# Patient Record
Sex: Male | Born: 1950 | Race: White | Hispanic: No | Marital: Single | Smoking: Never smoker
Health system: Southern US, Community
[De-identification: ages and names within clinical notes are randomized; demographics above are authoritative.]

## PROBLEM LIST (undated history)

## (undated) ENCOUNTER — Emergency Department (HOSPITAL_COMMUNITY): Admission: EM | Source: Home / Self Care

## (undated) ENCOUNTER — Ambulatory Visit: Admission: EM | Source: Home / Self Care

## (undated) DIAGNOSIS — R0602 Shortness of breath: Secondary | ICD-10-CM

## (undated) DIAGNOSIS — I499 Cardiac arrhythmia, unspecified: Secondary | ICD-10-CM

## (undated) DIAGNOSIS — K219 Gastro-esophageal reflux disease without esophagitis: Secondary | ICD-10-CM

## (undated) DIAGNOSIS — T8859XA Other complications of anesthesia, initial encounter: Secondary | ICD-10-CM

## (undated) DIAGNOSIS — E039 Hypothyroidism, unspecified: Secondary | ICD-10-CM

## (undated) DIAGNOSIS — M009 Pyogenic arthritis, unspecified: Secondary | ICD-10-CM

## (undated) DIAGNOSIS — J189 Pneumonia, unspecified organism: Secondary | ICD-10-CM

## (undated) DIAGNOSIS — Z86718 Personal history of other venous thrombosis and embolism: Secondary | ICD-10-CM

## (undated) DIAGNOSIS — Z9289 Personal history of other medical treatment: Secondary | ICD-10-CM

## (undated) DIAGNOSIS — G4730 Sleep apnea, unspecified: Secondary | ICD-10-CM

## (undated) DIAGNOSIS — I4819 Other persistent atrial fibrillation: Secondary | ICD-10-CM

## (undated) DIAGNOSIS — Z7901 Long term (current) use of anticoagulants: Secondary | ICD-10-CM

## (undated) DIAGNOSIS — R51 Headache: Secondary | ICD-10-CM

## (undated) DIAGNOSIS — T4145XA Adverse effect of unspecified anesthetic, initial encounter: Secondary | ICD-10-CM

## (undated) DIAGNOSIS — I509 Heart failure, unspecified: Secondary | ICD-10-CM

## (undated) DIAGNOSIS — R52 Pain, unspecified: Secondary | ICD-10-CM

## (undated) HISTORY — DX: Pyogenic arthritis, unspecified: M00.9

## (undated) HISTORY — DX: Long term (current) use of anticoagulants: Z79.01

## (undated) HISTORY — PX: CARDIAC CATHETERIZATION: SHX172

## (undated) HISTORY — PX: TONSILLECTOMY: SUR1361

## (undated) HISTORY — DX: Other persistent atrial fibrillation: I48.19

## (undated) HISTORY — PX: OTHER SURGICAL HISTORY: SHX169

---

## 1996-02-09 HISTORY — PX: OTHER SURGICAL HISTORY: SHX169

## 1997-02-08 HISTORY — PX: OTHER SURGICAL HISTORY: SHX169

## 1999-02-20 ENCOUNTER — Encounter: Payer: Self-pay | Admitting: Orthopedic Surgery

## 1999-02-24 ENCOUNTER — Inpatient Hospital Stay (HOSPITAL_COMMUNITY): Admission: RE | Admit: 1999-02-24 | Discharge: 1999-03-01 | Payer: Self-pay | Admitting: Orthopedic Surgery

## 1999-02-24 ENCOUNTER — Encounter: Payer: Self-pay | Admitting: Orthopedic Surgery

## 2000-02-09 HISTORY — PX: OTHER SURGICAL HISTORY: SHX169

## 2000-04-29 ENCOUNTER — Encounter: Payer: Self-pay | Admitting: Cardiovascular Disease

## 2000-04-29 ENCOUNTER — Ambulatory Visit (HOSPITAL_COMMUNITY): Admission: RE | Admit: 2000-04-29 | Discharge: 2000-04-29 | Payer: Self-pay | Admitting: Cardiovascular Disease

## 2000-08-09 ENCOUNTER — Inpatient Hospital Stay (HOSPITAL_COMMUNITY): Admission: RE | Admit: 2000-08-09 | Discharge: 2000-08-13 | Payer: Self-pay | Admitting: Orthopedic Surgery

## 2001-02-08 HISTORY — PX: OTHER SURGICAL HISTORY: SHX169

## 2002-02-08 HISTORY — PX: OTHER SURGICAL HISTORY: SHX169

## 2003-11-19 ENCOUNTER — Inpatient Hospital Stay (HOSPITAL_COMMUNITY): Admission: RE | Admit: 2003-11-19 | Discharge: 2003-11-22 | Payer: Self-pay | Admitting: Orthopedic Surgery

## 2004-02-09 HISTORY — PX: OTHER SURGICAL HISTORY: SHX169

## 2004-02-09 HISTORY — PX: CARDIAC CATHETERIZATION: SHX172

## 2004-04-07 ENCOUNTER — Inpatient Hospital Stay (HOSPITAL_COMMUNITY): Admission: RE | Admit: 2004-04-07 | Discharge: 2004-04-11 | Payer: Self-pay | Admitting: Orthopedic Surgery

## 2004-05-29 ENCOUNTER — Ambulatory Visit: Payer: Self-pay | Admitting: Internal Medicine

## 2004-05-29 ENCOUNTER — Inpatient Hospital Stay (HOSPITAL_COMMUNITY): Admission: AD | Admit: 2004-05-29 | Discharge: 2004-06-08 | Payer: Self-pay | Admitting: Orthopedic Surgery

## 2004-06-02 ENCOUNTER — Encounter (INDEPENDENT_AMBULATORY_CARE_PROVIDER_SITE_OTHER): Payer: Self-pay | Admitting: *Deleted

## 2004-08-17 ENCOUNTER — Ambulatory Visit: Payer: Self-pay | Admitting: Infectious Diseases

## 2005-09-27 ENCOUNTER — Emergency Department (HOSPITAL_COMMUNITY): Admission: EM | Admit: 2005-09-27 | Discharge: 2005-09-28 | Payer: Self-pay | Admitting: Emergency Medicine

## 2005-09-28 ENCOUNTER — Inpatient Hospital Stay (HOSPITAL_COMMUNITY): Admission: AD | Admit: 2005-09-28 | Discharge: 2005-10-06 | Payer: Self-pay | Admitting: Orthopedic Surgery

## 2005-09-28 ENCOUNTER — Ambulatory Visit: Payer: Self-pay | Admitting: Infectious Diseases

## 2005-09-30 ENCOUNTER — Encounter (INDEPENDENT_AMBULATORY_CARE_PROVIDER_SITE_OTHER): Payer: Self-pay | Admitting: *Deleted

## 2005-11-18 ENCOUNTER — Ambulatory Visit: Payer: Self-pay | Admitting: Internal Medicine

## 2005-11-18 ENCOUNTER — Inpatient Hospital Stay (HOSPITAL_COMMUNITY): Admission: RE | Admit: 2005-11-18 | Discharge: 2005-11-24 | Payer: Self-pay | Admitting: Orthopedic Surgery

## 2005-12-28 ENCOUNTER — Ambulatory Visit: Payer: Self-pay | Admitting: Internal Medicine

## 2006-01-20 ENCOUNTER — Ambulatory Visit (HOSPITAL_BASED_OUTPATIENT_CLINIC_OR_DEPARTMENT_OTHER): Admission: RE | Admit: 2006-01-20 | Discharge: 2006-01-20 | Payer: Self-pay | Admitting: Orthopedic Surgery

## 2006-02-08 HISTORY — PX: OTHER SURGICAL HISTORY: SHX169

## 2006-03-01 ENCOUNTER — Ambulatory Visit: Payer: Self-pay | Admitting: Internal Medicine

## 2006-03-01 LAB — CONVERTED CEMR LAB
CRP: 0.8 mg/dL — ABNORMAL HIGH (ref <0.6)
Sed Rate: 19 mm/hr — ABNORMAL HIGH (ref 0–16)

## 2006-03-02 DIAGNOSIS — T847XXA Infection and inflammatory reaction due to other internal orthopedic prosthetic devices, implants and grafts, initial encounter: Secondary | ICD-10-CM | POA: Insufficient documentation

## 2006-03-07 ENCOUNTER — Encounter: Payer: Self-pay | Admitting: Internal Medicine

## 2006-05-25 ENCOUNTER — Ambulatory Visit: Payer: Self-pay | Admitting: Internal Medicine

## 2008-05-12 ENCOUNTER — Ambulatory Visit: Payer: Self-pay | Admitting: Internal Medicine

## 2008-05-12 ENCOUNTER — Inpatient Hospital Stay (HOSPITAL_COMMUNITY): Admission: EM | Admit: 2008-05-12 | Discharge: 2008-06-06 | Payer: Self-pay | Admitting: Emergency Medicine

## 2008-05-13 ENCOUNTER — Ambulatory Visit: Payer: Self-pay | Admitting: Vascular Surgery

## 2008-05-13 ENCOUNTER — Encounter (INDEPENDENT_AMBULATORY_CARE_PROVIDER_SITE_OTHER): Payer: Self-pay | Admitting: *Deleted

## 2008-05-16 ENCOUNTER — Encounter (INDEPENDENT_AMBULATORY_CARE_PROVIDER_SITE_OTHER): Payer: Self-pay | Admitting: Internal Medicine

## 2008-05-16 ENCOUNTER — Ambulatory Visit: Payer: Self-pay | Admitting: Physical Medicine & Rehabilitation

## 2008-05-17 ENCOUNTER — Encounter (INDEPENDENT_AMBULATORY_CARE_PROVIDER_SITE_OTHER): Payer: Self-pay | Admitting: Internal Medicine

## 2008-05-18 ENCOUNTER — Encounter (INDEPENDENT_AMBULATORY_CARE_PROVIDER_SITE_OTHER): Payer: Self-pay | Admitting: Internal Medicine

## 2008-05-31 ENCOUNTER — Encounter (INDEPENDENT_AMBULATORY_CARE_PROVIDER_SITE_OTHER): Payer: Self-pay | Admitting: Internal Medicine

## 2008-06-18 ENCOUNTER — Encounter: Payer: Self-pay | Admitting: Internal Medicine

## 2008-07-04 ENCOUNTER — Encounter: Payer: Self-pay | Admitting: Internal Medicine

## 2008-07-04 ENCOUNTER — Ambulatory Visit: Payer: Self-pay | Admitting: Infectious Disease

## 2008-07-04 DIAGNOSIS — Z86718 Personal history of other venous thrombosis and embolism: Secondary | ICD-10-CM | POA: Insufficient documentation

## 2008-07-04 DIAGNOSIS — A491 Streptococcal infection, unspecified site: Secondary | ICD-10-CM | POA: Insufficient documentation

## 2008-07-04 DIAGNOSIS — E119 Type 2 diabetes mellitus without complications: Secondary | ICD-10-CM | POA: Insufficient documentation

## 2008-07-04 DIAGNOSIS — E118 Type 2 diabetes mellitus with unspecified complications: Secondary | ICD-10-CM | POA: Insufficient documentation

## 2008-07-04 DIAGNOSIS — N189 Chronic kidney disease, unspecified: Secondary | ICD-10-CM | POA: Insufficient documentation

## 2008-07-04 LAB — CONVERTED CEMR LAB
BUN: 15 mg/dL (ref 6–23)
Basophils Relative: 1 % (ref 0–1)
CRP: 8 mg/dL — ABNORMAL HIGH (ref <0.6)
Chloride: 103 meq/L (ref 96–112)
Creatinine, Ser: 1.35 mg/dL (ref 0.40–1.50)
Eosinophils Relative: 2 % (ref 0–5)
GFR calc non Af Amer: 54 mL/min — ABNORMAL LOW (ref >60)
Glucose, Bld: 103 mg/dL — ABNORMAL HIGH (ref 70–99)
HCT: 27.6 % — ABNORMAL LOW (ref 39.0–52.0)
Lymphs Abs: 1.2 10*3/uL (ref 0.7–4.0)
Monocytes Absolute: 0.6 10*3/uL (ref 0.1–1.0)
Monocytes Relative: 15 % — ABNORMAL HIGH (ref 3–12)
Neutrophils Relative %: 51 % (ref 43–77)
Potassium: 4.3 meq/L (ref 3.5–5.3)
RBC: 2.96 M/uL — ABNORMAL LOW (ref 4.22–5.81)
WBC: 3.8 10*3/uL — ABNORMAL LOW (ref 4.0–10.5)

## 2008-07-29 ENCOUNTER — Encounter: Payer: Self-pay | Admitting: Internal Medicine

## 2008-08-19 ENCOUNTER — Ambulatory Visit: Payer: Self-pay | Admitting: Infectious Disease

## 2008-08-19 LAB — CONVERTED CEMR LAB
Basophils Relative: 1 % (ref 0–1)
CRP: 0.9 mg/dL — ABNORMAL HIGH (ref <0.6)
Creatinine, Ser: 1.1 mg/dL (ref 0.40–1.50)
Eosinophils Absolute: 0.1 10*3/uL (ref 0.0–0.7)
Glucose, Bld: 95 mg/dL (ref 70–99)
HCT: 35.7 % — ABNORMAL LOW (ref 39.0–52.0)
Lymphocytes Relative: 32 % (ref 12–46)
Lymphs Abs: 1.4 10*3/uL (ref 0.7–4.0)
MCHC: 30.5 g/dL (ref 30.0–36.0)
MCV: 101.4 fL — ABNORMAL HIGH (ref 78.0–100.0)
Neutrophils Relative %: 50 % (ref 43–77)
RBC: 3.52 M/uL — ABNORMAL LOW (ref 4.22–5.81)
RDW: 15.8 % — ABNORMAL HIGH (ref 11.5–15.5)

## 2008-10-21 ENCOUNTER — Ambulatory Visit: Payer: Self-pay | Admitting: Infectious Disease

## 2009-08-26 ENCOUNTER — Ambulatory Visit (HOSPITAL_COMMUNITY): Admission: RE | Admit: 2009-08-26 | Discharge: 2009-08-27 | Payer: Self-pay | Admitting: Urology

## 2009-09-01 ENCOUNTER — Emergency Department (HOSPITAL_COMMUNITY): Admission: EM | Admit: 2009-09-01 | Discharge: 2009-09-01 | Payer: Self-pay | Admitting: Emergency Medicine

## 2009-09-10 ENCOUNTER — Encounter: Payer: Self-pay | Admitting: Internal Medicine

## 2009-09-10 ENCOUNTER — Ambulatory Visit: Payer: Self-pay | Admitting: Infectious Disease

## 2009-09-10 DIAGNOSIS — A4902 Methicillin resistant Staphylococcus aureus infection, unspecified site: Secondary | ICD-10-CM | POA: Insufficient documentation

## 2009-09-10 LAB — CONVERTED CEMR LAB
BUN: 18 mg/dL (ref 6–23)
Basophils Absolute: 0 10*3/uL (ref 0.0–0.1)
Basophils Relative: 1 % (ref 0–1)
CO2: 24 meq/L (ref 19–32)
Calcium: 9.5 mg/dL (ref 8.4–10.5)
Chloride: 106 meq/L (ref 96–112)
Creatinine, Ser: 1.08 mg/dL (ref 0.40–1.50)
Eosinophils Relative: 3 % (ref 0–5)
Glucose, Bld: 134 mg/dL — ABNORMAL HIGH (ref 70–99)
HCT: 37.6 % — ABNORMAL LOW (ref 39.0–52.0)
Hemoglobin: 11.6 g/dL — ABNORMAL LOW (ref 13.0–17.0)
Lymphs Abs: 1.7 10*3/uL (ref 0.7–4.0)
Monocytes Absolute: 0.4 10*3/uL (ref 0.1–1.0)
Monocytes Relative: 7 % (ref 3–12)
Neutro Abs: 2.9 10*3/uL (ref 1.7–7.7)
Platelets: 368 10*3/uL (ref 150–400)
Potassium: 4.2 meq/L (ref 3.5–5.3)
RDW: 13.9 % (ref 11.5–15.5)

## 2009-10-15 ENCOUNTER — Ambulatory Visit: Payer: Self-pay | Admitting: Infectious Disease

## 2009-10-15 LAB — CONVERTED CEMR LAB
Basophils Absolute: 0 10*3/uL (ref 0.0–0.1)
CO2: 28 meq/L (ref 19–32)
CRP: 0.6 mg/dL — ABNORMAL HIGH (ref <0.6)
Creatinine, Ser: 0.94 mg/dL (ref 0.40–1.50)
Eosinophils Relative: 3 % (ref 0–5)
HCT: 41.7 % (ref 39.0–52.0)
Hemoglobin: 12.5 g/dL — ABNORMAL LOW (ref 13.0–17.0)
Lymphocytes Relative: 31 % (ref 12–46)
Lymphs Abs: 1.5 10*3/uL (ref 0.7–4.0)
MCHC: 30 g/dL (ref 30.0–36.0)
MCV: 97 fL (ref 78.0–100.0)
Monocytes Absolute: 0.5 10*3/uL (ref 0.1–1.0)
Neutro Abs: 2.6 10*3/uL (ref 1.7–7.7)
Neutrophils Relative %: 54 % (ref 43–77)

## 2010-02-08 HISTORY — PX: OTHER SURGICAL HISTORY: SHX169

## 2010-03-01 ENCOUNTER — Encounter: Payer: Self-pay | Admitting: Orthopedic Surgery

## 2010-03-10 NOTE — Assessment & Plan Note (Signed)
Summary: F/U/VS   Visit Type:  Follow-up Referring Provider:  Dr. Chaney Malling  CC:  f/u and still having aching in left lefg.  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 Roxan Hockey and last seen by Dr. Cathie Olden in 2008. Patient had been  treated for prosthetic joint infection in 2007 with 6 weeks of IV antibiotics with removal of antibiotic spacer and reimplantation of prosthesis. He was being observed off antibiotics when this April 2010 when  he was admited in septic shock due once again to group C streptococci from his knee. He is sp i and d and removal of prosthetic material. He was continued on parenteral antibiotics througout most of the month of Aprl and then discharged on an additional 21 d of rocephin 1 g daily. I put him back on oral antibiotics PCN and then amoxicillin when I first saw him because I was concerned by persistence of erythema in the leg and warmth over the joint space. He also still had slightly elevated inflammatory markers. Apparently he still has a screw that cannot be removed from the leg that could be a nidus of infection. I have seen him several times since then.Since I last saw him in September 2010 he has not been taking the amoxicllin as rx due to confusion in rx. He has had several other coureses of antibiotics however. He had I and D of MRSA abscess in his neck by Dr. Jeannetta Nap in July.Marland Kitchen He had urological surgerylast month (during which time his pcr nasal fwiw was negative) His knee hadnot flared up off of antibiotics. He has been considring having his knee fused but was still holdingout hope that he could have another joint replacement. At last visit he wished to remain of his suppressive amoxicillin. His ESR at that visit had risen to above 70. He continue to ahve some pain with weight bearing but no lares of erythema or worsening effusion. He asks me if I could use radiation to  treat his knee. He again asks if he could have TKR and I advised against this.  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 Take 1 Tablet By Mouth Once A Day 2)  Hibiclens 4 % Liqd (Chlorhexidine Gluconate) .... Wash With Antiseptic Soap Once A Day For A Week, Dispense One Bottle 3)  Mupirocin 2 % Oint (Mupirocin) .... Apply To Inside of Nose Twice Daily For 7 Days  Allergies: 1)  ! Morphine    Current Allergies (reviewed today): ! MORPHINE Review of Systems  The patient denies anorexia, fever, weight loss, weight gain, vision loss, decreased hearing, hoarseness, chest pain, syncope, dyspnea on exertion, peripheral edema, prolonged cough, headaches, hemoptysis, abdominal pain, melena, hematochezia, severe indigestion/heartburn, hematuria, incontinence, genital sores, muscle weakness, suspicious skin lesions, transient blindness, difficulty walking, depression, unusual weight change, abnormal bleeding, and enlarged lymph nodes.    Vital Signs:  Patient profile:   60 year old male Height:      68 inches (172.72 cm) Weight:      379.25 pounds (172.39 kg) BMI:     57.87  Temp:     98.2 degrees F (36.78 degrees C) oral Pulse rate:   103 / minute BP sitting:   115 / 80  (left arm)  Vitals Entered By: Starleen Arms CMA (October 15, 2009 10:37 AM) CC: f/u, still having aching in left lefg Is Patient Diabetic? No Pain Assessment Patient in pain? no      Nutritional Status BMI of > 30 = obese  Does patient need assistance? Functional Status Self care Ambulation Impaired:Risk for fall Comments walks with  cane   Physical Exam  General:  alert and overweight-appearing.   Head:  normocephalic and atraumatic.   Eyes:  vision grossly intact Ears:  no external deformities.   Nose:  no external deformity.   Mouth:  pharynx pink and moist, no erythema, and no exudates.   Lungs:  normal respiratory effort,  Heart:  normal rate and regular rhythm.   Abdomen:  no distention.   Msk:  Left leg with extensive scar anteriorly. He has only no warmthover the joint site and erythema is gone from the knee and from the leg more distally Neurologic:  alert & oriented X3.   Skin:  see above Psych:  Oriented X3.     Impression & Recommendations:  Problem # 1:  INFECTION DUE TO INTERNAL ORTH DEVICE NEC (ZOX-096.04) I really think he still has indolent infection in his knee as shown by his elevateed ESR and his recurrent infections. Will check esr again today and if still up will advocate going back on suppressive amoxicillin Orders: T-Basic Metabolic Panel [id number]) T-CBC w/Diff [id number]) T-Sed Rate (Automated) [id number]) T-C-Reactive Protein [id number]) Est. Patient Level IV (01027)  Problem # 2:  STREPTOCOCCUS INFECTION CCE & UNS SITE GROUP C (ICD-041.03)  see above  Orders: Est. Patient Level IV (25366)  Problem # 3:  METHICILLIN RESISTANT STAPHYLOCOCCUS AUREUS INFECTION (ICD-041.12)  resolved. If he ever had surgery would get screening but would consider decontamiantion regimen and vanco adition if there were any suspicion of colonization. (PCR is not perfect tool esp if only used in nares)  Orders: Est. Patient Level IV (44034)  Patient Instructions: 1)  rtc to see Dr. Daiva Eves 2)  we will call you if you need to start antibiotics up within the next week

## 2010-03-10 NOTE — Assessment & Plan Note (Signed)
Summary: F/U OV/VS   Visit Type:  Follow-up Referring Vivika Poythress:  Dr. Chaney Malling  CC:  f/u .  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 Roxan Hockey and last seen by Dr. Cathie Olden in 2008. Patient had been  treated for prosthetic joint infection in 2007 with 6 weeks of IV antibiotics with removal of antibiotic spacer and reimplantation of prosthesis. He was being observed off antibiotics when this April 2010 when  he was admited in septic shock due once again to group C streptococci from his knee. He is sp i and d and removal of prosthetic material. He was continued on parenteral antibiotics througout most of the month of Aprl and then discharged on an additional 21 d of rocephin 1 g daily. I put him back on oral antibiotics PCN and then amoxicillin when I first saw him because I was concerned by persistence of erythema in the leg and warmth over the joint space. He also still had slightly elevated inflammatory markers. Apparently he still has a screw that cannot be removed from the leg that could be a nidus of infection. I have seen him several times since then.Since I last saw him in September 2010 he has not been taking the amoxicllin as rx due to confusion in rx. He has had several other coureses of antibiotics however. He recenntly had I and D of MRSA abscess in his neck by Dr. Jeannetta Nap. He had urological surgery this month (during which time his pcr nasal fwiw was negative) His knee has not flared up off of antibiotics. He is considring having his knee fused but was still holdingout hope that he could have another joint replacement. I told him that I agreed with Dr. Chaney Malling that a fused joint would be much less riskier for reinfection with prosthetic knee. At present he wished to remain off of the amoxicillin I had him on to try to suppress his likely nidus within his knee where there is still residual hardware. Saw Dr.  [place]annetta [place] who o did I and D of MRSA infection 2 months ago. Pt had hydrocoele.   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 Take 1 Tablet By Mouth Three Times A Day 2)  Aspirin 81 Mg Tbec (Aspirin) .... Take 1 Tablet By Mouth Once A Day  Current Medications (verified): 1)  Aspirin 81 Mg Tbec (Aspirin) .... Take 1 Tablet By Mouth Once A Day 2)  Hibiclens 4 % Liqd (Chlorhexidine Gluconate) .... Wash With Antiseptic Soap Once A Day For A Week, Dispense One Bottle 3)  Mupirocin 2 % Oint (Mupirocin) .... Apply To Inside of Nose Twice Daily For 7 Days  Allergies: 1)  ! Morphine    Current Allergies (reviewed today): ! 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   GERD.   Obstructive sleep apnea with CPAP.   diabetes.     ~  Past Surgical History: Last updated: 07/04/2008 see above :  Multiple bilateral knee surgeries and revision.      Family History: Last updated: 07/04/2008   Mother with CVA.  Father deceased at age 1 from an MI.   He has a sister with cancer.   Social History: Last updated: 07/04/2008  He does not smoke.  He   drinks beer daily.  Does not use drugs  Risk Factors: Alcohol Use: 0 (10/21/2008) Caffeine Use: coffee (10/21/2008) Exercise: yes (10/21/2008)  Risk Factors: Smoking Status: never (10/21/2008)  Family History: Reviewed history from 07/04/2008 and no changes required.   Mother with CVA.  Father deceased at  age 72 from an MI.   He has a sister with cancer.   Social History: Reviewed history from 07/04/2008 and no changes required.  He does not smoke.  He   drinks beer daily.  Does not use drugs  Review of Systems       The patient complains of suspicious skin lesions.  The patient denies anorexia, fever, weight loss, weight gain, vision loss, decreased hearing, hoarseness, chest pain, syncope, dyspnea on exertion, peripheral edema, prolonged cough, headaches, hemoptysis, abdominal pain, melena, hematochezia, severe indigestion/heartburn, hematuria, incontinence, genital sores, muscle weakness, transient blindness, difficulty walking, depression, unusual weight change, abnormal bleeding, and enlarged lymph nodes.    Vital Signs:  Patient profile:   60 year old male Height:      68 inches (172.72 cm) Weight:      375 pounds (170.45 kg) BMI:     57.22 Temp:     98.2 degrees F (36.78 degrees C) oral Pulse rate:   108 / minute BP sitting:   126 / 83  (right arm)  Vitals Entered By: Starleen Arms CMA (September 10, 2009 3:31 PM) CC: f/u  Is Patient Diabetic? No Pain Assessment Patient in pain? no      Nutritional Status BMI of > 30 = obese Nutritional Status Detail nl  Does patient need assistance? Functional Status Self care Ambulation Normal   Physical Exam  General:  alert and overweight-appearing.   Head:  normocephalic and atraumatic.   Eyes:  vision grossly intact Ears:  no external deformities.   Nose:  no external deformity.   Mouth:  pharynx pink and moist, no erythema, and no exudates.   Lungs:  normal respiratory effort,  Heart:  normal rate and regular rhythm.   Abdomen:  no distention.   Msk:  Left leg with extensive scar anteriorly. He has only no warmthover the joint site and erythema is gone from the knee and from the leg more distally Neurologic:  alert & oriented X3.     Impression & Recommendations:  Problem # 1:  INFECTION DUE TO INTERNAL ORTH DEVICE  NEC (BMW-413.24) Pt wished to remain off antibiotics for now. I will recheck esr, crp today and have him come back in October obseriving him off of antibiotics. I support fusion and agree that repeat prosthetic joint placement is at risk of becoming infected yet again. Orders: Est. Patient Level IV (40102)  Problem # 2:  STREPTOCOCCUS INFECTION CCE & UNS SITE GROUP C (ICD-041.03) Group c was culprit from knee Orders: T-Basic Metabolic Panel [id number]) T-CBC w/Diff [id number]) T-C-Reactive Protein [id number]) T-Sed Rate (Automated) [phone]) T-Basic Metabolic Panel [id number]) T-CBC w/Diff [id number]) T-Sed Rate (Automated) [id number]) T-C-Reactive Protein [id number]) Est. Patient Level IV (71062)  Problem # 3:  METHICILLIN RESISTANT STAPHYLOCOCCUS AUREUS INFECTION (ICD-041.12)  I am giving him hibiclenz decontamination plus in mupirocin to decontaminate him. He should repeat this regimen  in the week before surgery--should he have it. I would also personally recommend giving him ancef PLUS VANCOMYCIN prior to surgery to reduce risk of postop infection with MRSA, MSSA and strep  Orders: Est. Patient Level IV (04540)  Medications Added to Medication List This Visit: 1)  Hibiclens 4 % Liqd (Chlorhexidine gluconate) .... Wash with antiseptic soap once a day for a week, dispense one bottle 2)  Mupirocin 2 % Oint (Mupirocin) .... Apply to inside of nose twice daily for 7 days  Patient Instructions: 1)  rtc in one month to see Dr. Daiva Eves Prescriptions: MUPIROCIN 2 % OINT (MUPIROCIN) apply to inside of nose twice daily for 7 days  #30 x 3   Entered and Authorized by:   Acey Lav MD   Signed by:   Paulette Blanch Dam MD on 09/10/2009   Method used:   Electronically to        [facility] (retail)       [address]       [place], [place]  [address]       Ph: [phone]       Fax: [phone]   RxID:   [id number] HIBICLENS  4 % LIQD (CHLORHEXIDINE GLUCONATE) wash with antiseptic soap once a day for a week, dispense one bottle  #1 x 3   Entered and Authorized by:   Acey Lav MD   Signed by:   Paulette Blanch Dam MD on 09/10/2009   Method used:   Electronically to        [facility] (retail)       [address]       [place], [place]  [address]       Ph: [phone]       Fax: [phone]   RxID:   [id number]

## 2010-04-20 ENCOUNTER — Encounter: Payer: Self-pay | Admitting: *Deleted

## 2010-04-25 LAB — COMPREHENSIVE METABOLIC PANEL
ALT: 17 U/L (ref 0–53)
AST: 16 U/L (ref 0–37)
Albumin: 3.8 g/dL (ref 3.5–5.2)
Calcium: 9.5 mg/dL (ref 8.4–10.5)
GFR calc Af Amer: >60 mL/min (ref >60)
Glucose, Bld: 103 mg/dL — ABNORMAL HIGH (ref 70–99)
Sodium: 140 mEq/L (ref 135–145)
Total Protein: 7.1 g/dL (ref 6.0–8.3)

## 2010-04-25 LAB — CBC
HCT: 36.7 % — ABNORMAL LOW (ref 39.0–52.0)
MCH: 30.7 pg (ref 26.0–34.0)
MCV: 90.8 fL (ref 78.0–100.0)
RBC: 4.05 MIL/uL — ABNORMAL LOW (ref 4.22–5.81)
RDW: 14.2 % (ref 11.5–15.5)
WBC: 4.3 10*3/uL (ref 4.0–10.5)

## 2010-04-25 LAB — SURGICAL PCR SCREEN: Staphylococcus aureus: NEGATIVE

## 2010-05-20 LAB — COMPREHENSIVE METABOLIC PANEL
ALT: 28 U/L (ref 0–53)
ALT: 38 U/L (ref 0–53)
AST: 25 U/L (ref 0–37)
AST: 39 U/L — ABNORMAL HIGH (ref 0–37)
AST: 58 U/L — ABNORMAL HIGH (ref 0–37)
Albumin: 1.6 g/dL — ABNORMAL LOW (ref 3.5–5.2)
Albumin: 1.8 g/dL — ABNORMAL LOW (ref 3.5–5.2)
Albumin: 1.8 g/dL — ABNORMAL LOW (ref 3.5–5.2)
Albumin: 1.9 g/dL — ABNORMAL LOW (ref 3.5–5.2)
Albumin: 3.1 g/dL — ABNORMAL LOW (ref 3.5–5.2)
Alkaline Phosphatase: 43 U/L (ref 39–117)
Alkaline Phosphatase: 54 U/L (ref 39–117)
Alkaline Phosphatase: 59 U/L (ref 39–117)
Alkaline Phosphatase: 61 U/L (ref 39–117)
BUN: 11 mg/dL (ref 6–23)
BUN: 13 mg/dL (ref 6–23)
BUN: 19 mg/dL (ref 6–23)
BUN: 22 mg/dL (ref 6–23)
BUN: 28 mg/dL — ABNORMAL HIGH (ref 6–23)
BUN: 37 mg/dL — ABNORMAL HIGH (ref 6–23)
CO2: 22 mEq/L (ref 19–32)
CO2: 23 mEq/L (ref 19–32)
CO2: 24 mEq/L (ref 19–32)
Calcium: 6.9 mg/dL — ABNORMAL LOW (ref 8.4–10.5)
Calcium: 6.9 mg/dL — ABNORMAL LOW (ref 8.4–10.5)
Calcium: 9.3 mg/dL (ref 8.4–10.5)
Chloride: 102 mEq/L (ref 96–112)
Chloride: 103 mEq/L (ref 96–112)
Chloride: 104 mEq/L (ref 96–112)
Chloride: 105 mEq/L (ref 96–112)
Chloride: 106 mEq/L (ref 96–112)
Creatinine, Ser: 1.63 mg/dL — ABNORMAL HIGH (ref 0.4–1.5)
Creatinine, Ser: 1.94 mg/dL — ABNORMAL HIGH (ref 0.4–1.5)
Creatinine, Ser: 3.55 mg/dL — ABNORMAL HIGH (ref 0.4–1.5)
GFR calc Af Amer: 16 mL/min — ABNORMAL LOW (ref >60)
GFR calc Af Amer: 53 mL/min — ABNORMAL LOW (ref >60)
GFR calc Af Amer: >60 mL/min (ref >60)
GFR calc non Af Amer: 13 mL/min — ABNORMAL LOW (ref >60)
GFR calc non Af Amer: 19 mL/min — ABNORMAL LOW (ref >60)
GFR calc non Af Amer: 36 mL/min — ABNORMAL LOW (ref >60)
Glucose, Bld: 117 mg/dL — ABNORMAL HIGH (ref 70–99)
Glucose, Bld: 95 mg/dL (ref 70–99)
Glucose, Bld: 99 mg/dL (ref 70–99)
Potassium: 3.5 mEq/L (ref 3.5–5.1)
Potassium: 3.5 mEq/L (ref 3.5–5.1)
Potassium: 3.5 mEq/L (ref 3.5–5.1)
Potassium: 4.2 mEq/L (ref 3.5–5.1)
Sodium: 134 mEq/L — ABNORMAL LOW (ref 135–145)
Sodium: 136 mEq/L (ref 135–145)
Total Bilirubin: 0.6 mg/dL (ref 0.3–1.2)
Total Bilirubin: 0.8 mg/dL (ref 0.3–1.2)
Total Bilirubin: 0.9 mg/dL (ref 0.3–1.2)
Total Bilirubin: 1.4 mg/dL — ABNORMAL HIGH (ref 0.3–1.2)
Total Protein: 4.6 g/dL — ABNORMAL LOW (ref 6.0–8.3)
Total Protein: 5.8 g/dL — ABNORMAL LOW (ref 6.0–8.3)
Total Protein: 5.9 g/dL — ABNORMAL LOW (ref 6.0–8.3)

## 2010-05-20 LAB — BASIC METABOLIC PANEL
BUN: 19 mg/dL (ref 6–23)
BUN: 20 mg/dL (ref 6–23)
BUN: 22 mg/dL (ref 6–23)
BUN: 17 mg/dL (ref 6–23)
BUN: 27 mg/dL — ABNORMAL HIGH (ref 6–23)
BUN: 34 mg/dL — ABNORMAL HIGH (ref 6–23)
BUN: 4 mg/dL — ABNORMAL LOW (ref 6–23)
CO2: 27 mEq/L (ref 19–32)
CO2: 24 mEq/L (ref 19–32)
CO2: 24 mEq/L (ref 19–32)
CO2: 24 mEq/L (ref 19–32)
Calcium: 8.2 mg/dL — ABNORMAL LOW (ref 8.4–10.5)
Calcium: 6.8 mg/dL — ABNORMAL LOW (ref 8.4–10.5)
Calcium: 7.9 mg/dL — ABNORMAL LOW (ref 8.4–10.5)
Calcium: 8.2 mg/dL — ABNORMAL LOW (ref 8.4–10.5)
Chloride: 101 mEq/L (ref 96–112)
Chloride: 101 mEq/L (ref 96–112)
Chloride: 104 mEq/L (ref 96–112)
Chloride: 107 mEq/L (ref 96–112)
Chloride: 103 mEq/L (ref 96–112)
Chloride: 104 mEq/L (ref 96–112)
Chloride: 105 mEq/L (ref 96–112)
Chloride: 107 mEq/L (ref 96–112)
Chloride: 107 mEq/L (ref 96–112)
Creatinine, Ser: 1.81 mg/dL — ABNORMAL HIGH (ref 0.4–1.5)
Creatinine, Ser: 1.95 mg/dL — ABNORMAL HIGH (ref 0.4–1.5)
Creatinine, Ser: 1.02 mg/dL (ref 0.4–1.5)
Creatinine, Ser: 3.8 mg/dL — ABNORMAL HIGH (ref 0.4–1.5)
Creatinine, Ser: 4.02 mg/dL — ABNORMAL HIGH (ref 0.4–1.5)
Creatinine, Ser: 4.22 mg/dL — ABNORMAL HIGH (ref 0.4–1.5)
Creatinine, Ser: 4.48 mg/dL — ABNORMAL HIGH (ref 0.4–1.5)
Creatinine, Ser: 4.49 mg/dL — ABNORMAL HIGH (ref 0.4–1.5)
GFR calc Af Amer: 24 mL/min — ABNORMAL LOW (ref >60)
GFR calc Af Amer: 24 mL/min — ABNORMAL LOW (ref >60)
GFR calc Af Amer: 29 mL/min — ABNORMAL LOW (ref >60)
GFR calc Af Amer: 41 mL/min — ABNORMAL LOW (ref >60)
GFR calc Af Amer: 44 mL/min — ABNORMAL LOW (ref >60)
GFR calc Af Amer: 47 mL/min — ABNORMAL LOW (ref >60)
GFR calc Af Amer: 18 mL/min — ABNORMAL LOW (ref >60)
GFR calc Af Amer: 19 mL/min — ABNORMAL LOW (ref >60)
GFR calc Af Amer: 20 mL/min — ABNORMAL LOW (ref >60)
GFR calc Af Amer: >60 mL/min (ref >60)
GFR calc non Af Amer: 17 mL/min — ABNORMAL LOW (ref >60)
GFR calc non Af Amer: 20 mL/min — ABNORMAL LOW (ref >60)
GFR calc non Af Amer: 34 mL/min — ABNORMAL LOW (ref >60)
GFR calc non Af Amer: 36 mL/min — ABNORMAL LOW (ref >60)
GFR calc non Af Amer: 36 mL/min — ABNORMAL LOW (ref >60)
GFR calc non Af Amer: 39 mL/min — ABNORMAL LOW (ref >60)
GFR calc non Af Amer: 14 mL/min — ABNORMAL LOW (ref >60)
GFR calc non Af Amer: 15 mL/min — ABNORMAL LOW (ref >60)
GFR calc non Af Amer: >60 mL/min (ref >60)
Glucose, Bld: 101 mg/dL — ABNORMAL HIGH (ref 70–99)
Glucose, Bld: 101 mg/dL — ABNORMAL HIGH (ref 70–99)
Glucose, Bld: 111 mg/dL — ABNORMAL HIGH (ref 70–99)
Glucose, Bld: 101 mg/dL — ABNORMAL HIGH (ref 70–99)
Glucose, Bld: 112 mg/dL — ABNORMAL HIGH (ref 70–99)
Potassium: 3.7 mEq/L (ref 3.5–5.1)
Potassium: 3.8 mEq/L (ref 3.5–5.1)
Potassium: 3.8 mEq/L (ref 3.5–5.1)
Potassium: 4.2 mEq/L (ref 3.5–5.1)
Potassium: 4.2 mEq/L (ref 3.5–5.1)
Potassium: 3.4 mEq/L — ABNORMAL LOW (ref 3.5–5.1)
Potassium: 3.5 mEq/L (ref 3.5–5.1)
Potassium: 3.6 mEq/L (ref 3.5–5.1)
Potassium: 3.6 mEq/L (ref 3.5–5.1)
Sodium: 136 mEq/L (ref 135–145)
Sodium: 137 mEq/L (ref 135–145)
Sodium: 137 mEq/L (ref 135–145)
Sodium: 139 mEq/L (ref 135–145)
Sodium: 132 mEq/L — ABNORMAL LOW (ref 135–145)
Sodium: 136 mEq/L (ref 135–145)
Sodium: 137 mEq/L (ref 135–145)

## 2010-05-20 LAB — GLUCOSE, CAPILLARY
Glucose-Capillary: 100 mg/dL — ABNORMAL HIGH (ref 70–99)
Glucose-Capillary: 100 mg/dL — ABNORMAL HIGH (ref 70–99)
Glucose-Capillary: 102 mg/dL — ABNORMAL HIGH (ref 70–99)
Glucose-Capillary: 102 mg/dL — ABNORMAL HIGH (ref 70–99)
Glucose-Capillary: 103 mg/dL — ABNORMAL HIGH (ref 70–99)
Glucose-Capillary: 104 mg/dL — ABNORMAL HIGH (ref 70–99)
Glucose-Capillary: 105 mg/dL — ABNORMAL HIGH (ref 70–99)
Glucose-Capillary: 105 mg/dL — ABNORMAL HIGH (ref 70–99)
Glucose-Capillary: 106 mg/dL — ABNORMAL HIGH (ref 70–99)
Glucose-Capillary: 107 mg/dL — ABNORMAL HIGH (ref 70–99)
Glucose-Capillary: 107 mg/dL — ABNORMAL HIGH (ref 70–99)
Glucose-Capillary: 108 mg/dL — ABNORMAL HIGH (ref 70–99)
Glucose-Capillary: 108 mg/dL — ABNORMAL HIGH (ref 70–99)
Glucose-Capillary: 109 mg/dL — ABNORMAL HIGH (ref 70–99)
Glucose-Capillary: 109 mg/dL — ABNORMAL HIGH (ref 70–99)
Glucose-Capillary: 109 mg/dL — ABNORMAL HIGH (ref 70–99)
Glucose-Capillary: 111 mg/dL — ABNORMAL HIGH (ref 70–99)
Glucose-Capillary: 112 mg/dL — ABNORMAL HIGH (ref 70–99)
Glucose-Capillary: 115 mg/dL — ABNORMAL HIGH (ref 70–99)
Glucose-Capillary: 117 mg/dL — ABNORMAL HIGH (ref 70–99)
Glucose-Capillary: 119 mg/dL — ABNORMAL HIGH (ref 70–99)
Glucose-Capillary: 120 mg/dL — ABNORMAL HIGH (ref 70–99)
Glucose-Capillary: 124 mg/dL — ABNORMAL HIGH (ref 70–99)
Glucose-Capillary: 127 mg/dL — ABNORMAL HIGH (ref 70–99)
Glucose-Capillary: 129 mg/dL — ABNORMAL HIGH (ref 70–99)
Glucose-Capillary: 182 mg/dL — ABNORMAL HIGH (ref 70–99)
Glucose-Capillary: 93 mg/dL (ref 70–99)
Glucose-Capillary: 93 mg/dL (ref 70–99)
Glucose-Capillary: 96 mg/dL (ref 70–99)
Glucose-Capillary: 96 mg/dL (ref 70–99)
Glucose-Capillary: 98 mg/dL (ref 70–99)
Glucose-Capillary: 99 mg/dL (ref 70–99)
Glucose-Capillary: 100 mg/dL — ABNORMAL HIGH (ref 70–99)
Glucose-Capillary: 101 mg/dL — ABNORMAL HIGH (ref 70–99)
Glucose-Capillary: 102 mg/dL — ABNORMAL HIGH (ref 70–99)
Glucose-Capillary: 106 mg/dL — ABNORMAL HIGH (ref 70–99)
Glucose-Capillary: 107 mg/dL — ABNORMAL HIGH (ref 70–99)
Glucose-Capillary: 112 mg/dL — ABNORMAL HIGH (ref 70–99)
Glucose-Capillary: 113 mg/dL — ABNORMAL HIGH (ref 70–99)
Glucose-Capillary: 114 mg/dL — ABNORMAL HIGH (ref 70–99)
Glucose-Capillary: 115 mg/dL — ABNORMAL HIGH (ref 70–99)
Glucose-Capillary: 117 mg/dL — ABNORMAL HIGH (ref 70–99)
Glucose-Capillary: 118 mg/dL — ABNORMAL HIGH (ref 70–99)
Glucose-Capillary: 120 mg/dL — ABNORMAL HIGH (ref 70–99)
Glucose-Capillary: 121 mg/dL — ABNORMAL HIGH (ref 70–99)
Glucose-Capillary: 126 mg/dL — ABNORMAL HIGH (ref 70–99)
Glucose-Capillary: 134 mg/dL — ABNORMAL HIGH (ref 70–99)
Glucose-Capillary: 135 mg/dL — ABNORMAL HIGH (ref 70–99)
Glucose-Capillary: 138 mg/dL — ABNORMAL HIGH (ref 70–99)
Glucose-Capillary: 143 mg/dL — ABNORMAL HIGH (ref 70–99)
Glucose-Capillary: 93 mg/dL (ref 70–99)
Glucose-Capillary: 99 mg/dL (ref 70–99)
Glucose-Capillary: 99 mg/dL (ref 70–99)
Glucose-Capillary: 99 mg/dL (ref 70–99)

## 2010-05-20 LAB — RENAL FUNCTION PANEL
Albumin: 2.1 g/dL — ABNORMAL LOW (ref 3.5–5.2)
Albumin: 1.6 g/dL — ABNORMAL LOW (ref 3.5–5.2)
Albumin: 1.6 g/dL — ABNORMAL LOW (ref 3.5–5.2)
BUN: 22 mg/dL (ref 6–23)
BUN: 33 mg/dL — ABNORMAL HIGH (ref 6–23)
CO2: 29 mEq/L (ref 19–32)
Calcium: 7.9 mg/dL — ABNORMAL LOW (ref 8.4–10.5)
Chloride: 101 mEq/L (ref 96–112)
Chloride: 107 mEq/L (ref 96–112)
Chloride: 104 mEq/L (ref 96–112)
Creatinine, Ser: 2.34 mg/dL — ABNORMAL HIGH (ref 0.4–1.5)
GFR calc Af Amer: 22 mL/min — ABNORMAL LOW (ref >60)
GFR calc Af Amer: 35 mL/min — ABNORMAL LOW (ref >60)
GFR calc Af Amer: 16 mL/min — ABNORMAL LOW (ref >60)
GFR calc non Af Amer: 18 mL/min — ABNORMAL LOW (ref >60)
GFR calc non Af Amer: 29 mL/min — ABNORMAL LOW (ref >60)
GFR calc non Af Amer: 13 mL/min — ABNORMAL LOW (ref >60)
Phosphorus: 5.2 mg/dL — ABNORMAL HIGH (ref 2.3–4.6)
Potassium: 4 mEq/L (ref 3.5–5.1)
Potassium: 3.7 mEq/L (ref 3.5–5.1)
Potassium: 3.8 mEq/L (ref 3.5–5.1)
Sodium: 136 mEq/L (ref 135–145)

## 2010-05-20 LAB — BLOOD GAS, ARTERIAL
Acid-base deficit: 2.3 mmol/L — ABNORMAL HIGH (ref 0.0–2.0)
Bicarbonate: 24.9 mEq/L — ABNORMAL HIGH (ref 20.0–24.0)
Bicarbonate: 22.2 mEq/L (ref 20.0–24.0)
Drawn by: 275531
Drawn by: 23588
FIO2: 0.7 %
MECHVT: 600 mL
MECHVT: 600 mL
O2 Content: 6 L/min
O2 Saturation: 97.7 %
O2 Saturation: 97.4 %
O2 Saturation: 97.7 %
PEEP: 5 cmH2O
Patient temperature: 98.6
Patient temperature: 98.6
Patient temperature: 98.6
RATE: 14 resp/min
TCO2: 25 mmol/L (ref 0–100)
TCO2: 26.4 mmol/L (ref 0–100)
TCO2: 23.4 mmol/L (ref 0–100)
pCO2 arterial: 49.1 mmHg — ABNORMAL HIGH (ref 35.0–45.0)
pCO2 arterial: 50.9 mmHg — ABNORMAL HIGH (ref 35.0–45.0)
pH, Arterial: 7.302 — ABNORMAL LOW (ref 7.350–7.450)
pO2, Arterial: 109 mmHg — ABNORMAL HIGH (ref 80.0–100.0)

## 2010-05-20 LAB — DIFFERENTIAL
Basophils Absolute: 0 10*3/uL (ref 0.0–0.1)
Basophils Absolute: 0 10*3/uL (ref 0.0–0.1)
Eosinophils Absolute: 0 10*3/uL (ref 0.0–0.7)
Eosinophils Absolute: 0.1 10*3/uL (ref 0.0–0.7)
Eosinophils Absolute: 0 10*3/uL (ref 0.0–0.7)
Eosinophils Absolute: 0 10*3/uL (ref 0.0–0.7)
Eosinophils Absolute: 0 10*3/uL (ref 0.0–0.7)
Eosinophils Relative: 1 % (ref 0–5)
Eosinophils Relative: 1 % (ref 0–5)
Eosinophils Relative: 2 % (ref 0–5)
Eosinophils Relative: 0 % (ref 0–5)
Lymphocytes Relative: 30 % (ref 12–46)
Lymphocytes Relative: 33 % (ref 12–46)
Lymphocytes Relative: 11 % — ABNORMAL LOW (ref 12–46)
Lymphs Abs: 1.1 10*3/uL (ref 0.7–4.0)
Lymphs Abs: 1.2 10*3/uL (ref 0.7–4.0)
Lymphs Abs: 0.7 10*3/uL (ref 0.7–4.0)
Lymphs Abs: 0.7 10*3/uL (ref 0.7–4.0)
Lymphs Abs: 0.7 10*3/uL (ref 0.7–4.0)
Lymphs Abs: 0.9 10*3/uL (ref 0.7–4.0)
Monocytes Absolute: 0.5 10*3/uL (ref 0.1–1.0)
Monocytes Absolute: 0.5 10*3/uL (ref 0.1–1.0)
Monocytes Relative: 14 % — ABNORMAL HIGH (ref 3–12)
Monocytes Relative: 10 % (ref 3–12)
Monocytes Relative: 9 % (ref 3–12)
Monocytes Relative: 9 % (ref 3–12)
Neutro Abs: 4 10*3/uL (ref 1.7–7.7)
Neutro Abs: 6.5 10*3/uL (ref 1.7–7.7)
Neutro Abs: 7.1 10*3/uL (ref 1.7–7.7)
Neutrophils Relative %: 52 % (ref 43–77)
Neutrophils Relative %: 59 % (ref 43–77)
Neutrophils Relative %: 77 % (ref 43–77)
Neutrophils Relative %: 78 % — ABNORMAL HIGH (ref 43–77)
Neutrophils Relative %: 86 % — ABNORMAL HIGH (ref 43–77)

## 2010-05-20 LAB — PROTIME-INR
INR: 2 — ABNORMAL HIGH (ref 0.00–1.49)
INR: 2.2 — ABNORMAL HIGH (ref 0.00–1.49)
INR: 2.3 — ABNORMAL HIGH (ref 0.00–1.49)
INR: 2.8 — ABNORMAL HIGH (ref 0.00–1.49)
INR: 1.3 (ref 0.00–1.49)
INR: 1.4 (ref 0.00–1.49)
Prothrombin Time: 16.2 seconds — ABNORMAL HIGH (ref 11.6–15.2)
Prothrombin Time: 26.2 seconds — ABNORMAL HIGH (ref 11.6–15.2)
Prothrombin Time: 26.5 seconds — ABNORMAL HIGH (ref 11.6–15.2)
Prothrombin Time: 29.4 seconds — ABNORMAL HIGH (ref 11.6–15.2)
Prothrombin Time: 29.8 seconds — ABNORMAL HIGH (ref 11.6–15.2)
Prothrombin Time: 31.6 seconds — ABNORMAL HIGH (ref 11.6–15.2)

## 2010-05-20 LAB — GRAM STAIN

## 2010-05-20 LAB — CBC
HCT: 23.5 % — ABNORMAL LOW (ref 39.0–52.0)
HCT: 23.7 % — ABNORMAL LOW (ref 39.0–52.0)
HCT: 25.4 % — ABNORMAL LOW (ref 39.0–52.0)
HCT: 25.6 % — ABNORMAL LOW (ref 39.0–52.0)
HCT: 22.6 % — ABNORMAL LOW (ref 39.0–52.0)
HCT: 22.7 % — ABNORMAL LOW (ref 39.0–52.0)
HCT: 24.3 % — ABNORMAL LOW (ref 39.0–52.0)
HCT: 24.4 % — ABNORMAL LOW (ref 39.0–52.0)
HCT: 30.4 % — ABNORMAL LOW (ref 39.0–52.0)
HCT: 31 % — ABNORMAL LOW (ref 39.0–52.0)
HCT: 33.2 % — ABNORMAL LOW (ref 39.0–52.0)
Hemoglobin: 8 g/dL — ABNORMAL LOW (ref 13.0–17.0)
Hemoglobin: 8.1 g/dL — ABNORMAL LOW (ref 13.0–17.0)
Hemoglobin: 8.3 g/dL — ABNORMAL LOW (ref 13.0–17.0)
Hemoglobin: 10.5 g/dL — ABNORMAL LOW (ref 13.0–17.0)
Hemoglobin: 10.6 g/dL — ABNORMAL LOW (ref 13.0–17.0)
Hemoglobin: 8 g/dL — ABNORMAL LOW (ref 13.0–17.0)
Hemoglobin: 8.1 g/dL — ABNORMAL LOW (ref 13.0–17.0)
Hemoglobin: 8.4 g/dL — ABNORMAL LOW (ref 13.0–17.0)
Hemoglobin: 8.4 g/dL — ABNORMAL LOW (ref 13.0–17.0)
Hemoglobin: 8.8 g/dL — ABNORMAL LOW (ref 13.0–17.0)
MCHC: 33.9 g/dL (ref 30.0–36.0)
MCHC: 34 g/dL (ref 30.0–36.0)
MCHC: 34.8 g/dL (ref 30.0–36.0)
MCHC: 35 g/dL (ref 30.0–36.0)
MCHC: 35.2 g/dL (ref 30.0–36.0)
MCHC: 35.7 g/dL (ref 30.0–36.0)
MCV: 90.4 fL (ref 78.0–100.0)
MCV: 90.5 fL (ref 78.0–100.0)
MCV: 90.8 fL (ref 78.0–100.0)
MCV: 91.4 fL (ref 78.0–100.0)
MCV: 91.8 fL (ref 78.0–100.0)
MCV: 93.4 fL (ref 78.0–100.0)
Platelets: 180 10*3/uL (ref 150–400)
Platelets: 185 10*3/uL (ref 150–400)
Platelets: 218 10*3/uL (ref 150–400)
Platelets: 126 10*3/uL — ABNORMAL LOW (ref 150–400)
Platelets: 140 10*3/uL — ABNORMAL LOW (ref 150–400)
Platelets: 140 10*3/uL — ABNORMAL LOW (ref 150–400)
Platelets: 143 10*3/uL — ABNORMAL LOW (ref 150–400)
Platelets: 270 10*3/uL (ref 150–400)
Platelets: 316 10*3/uL (ref 150–400)
Platelets: 422 10*3/uL — ABNORMAL HIGH (ref 150–400)
Platelets: 458 10*3/uL — ABNORMAL HIGH (ref 150–400)
RBC: 2.59 MIL/uL — ABNORMAL LOW (ref 4.22–5.81)
RBC: 2.68 MIL/uL — ABNORMAL LOW (ref 4.22–5.81)
RBC: 2.51 MIL/uL — ABNORMAL LOW (ref 4.22–5.81)
RBC: 2.62 MIL/uL — ABNORMAL LOW (ref 4.22–5.81)
RBC: 2.69 MIL/uL — ABNORMAL LOW (ref 4.22–5.81)
RBC: 2.79 MIL/uL — ABNORMAL LOW (ref 4.22–5.81)
RBC: 2.85 MIL/uL — ABNORMAL LOW (ref 4.22–5.81)
RBC: 3.3 MIL/uL — ABNORMAL LOW (ref 4.22–5.81)
RBC: 3.54 MIL/uL — ABNORMAL LOW (ref 4.22–5.81)
RDW: 14.7 % (ref 11.5–15.5)
RDW: 14.9 % (ref 11.5–15.5)
RDW: 15.1 % (ref 11.5–15.5)
RDW: 14.8 % (ref 11.5–15.5)
RDW: 15.8 % — ABNORMAL HIGH (ref 11.5–15.5)
RDW: 15.9 % — ABNORMAL HIGH (ref 11.5–15.5)
RDW: 15.9 % — ABNORMAL HIGH (ref 11.5–15.5)
RDW: 16 % — ABNORMAL HIGH (ref 11.5–15.5)
WBC: 3.9 10*3/uL — ABNORMAL LOW (ref 4.0–10.5)
WBC: 4.2 10*3/uL (ref 4.0–10.5)
WBC: 4.3 10*3/uL (ref 4.0–10.5)
WBC: 4.7 10*3/uL (ref 4.0–10.5)
WBC: 6.3 10*3/uL (ref 4.0–10.5)
WBC: 11.2 10*3/uL — ABNORMAL HIGH (ref 4.0–10.5)
WBC: 5.2 10*3/uL (ref 4.0–10.5)
WBC: 5.7 10*3/uL (ref 4.0–10.5)
WBC: 6 10*3/uL (ref 4.0–10.5)
WBC: 6.7 10*3/uL (ref 4.0–10.5)
WBC: 6.8 10*3/uL (ref 4.0–10.5)
WBC: 8.3 10*3/uL (ref 4.0–10.5)
WBC: 8.7 10*3/uL (ref 4.0–10.5)
WBC: 9.6 10*3/uL (ref 4.0–10.5)

## 2010-05-20 LAB — CROSSMATCH
Antibody Screen: NEGATIVE
Antibody Screen: NEGATIVE

## 2010-05-20 LAB — URINE MICROSCOPIC-ADD ON

## 2010-05-20 LAB — URINALYSIS, ROUTINE W REFLEX MICROSCOPIC
Bilirubin Urine: NEGATIVE
Bilirubin Urine: NEGATIVE
Glucose, UA: NEGATIVE mg/dL
Glucose, UA: NEGATIVE mg/dL
Ketones, ur: NEGATIVE mg/dL
Ketones, ur: 15 mg/dL — AB
Nitrite: NEGATIVE
Protein, ur: 100 mg/dL — AB
Specific Gravity, Urine: 1.009 (ref 1.005–1.030)
Urobilinogen, UA: 0.2 mg/dL (ref 0.0–1.0)
pH: 6 (ref 5.0–8.0)
pH: 5 (ref 5.0–8.0)

## 2010-05-20 LAB — SYNOVIAL CELL COUNT + DIFF, W/ CRYSTALS
Crystals, Fluid: NONE SEEN
Monocyte-Macrophage-Synovial Fluid: 1 % — ABNORMAL LOW (ref 50–90)

## 2010-05-20 LAB — CK: Total CK: 138 U/L (ref 7–232)

## 2010-05-20 LAB — URINE CULTURE: Culture: NO GROWTH

## 2010-05-20 LAB — VANCOMYCIN, RANDOM: Vancomycin Rm: 18.4 ug/mL

## 2010-05-20 LAB — IRON AND TIBC: Iron: 12 ug/dL — ABNORMAL LOW (ref 42–135)

## 2010-05-20 LAB — C4 COMPLEMENT: Complement C4, Body Fluid: 16 mg/dL (ref 16–47)

## 2010-05-20 LAB — VITAMIN B12: Vitamin B-12: 588 pg/mL (ref 211–911)

## 2010-05-20 LAB — CREATININE, URINE, RANDOM: Creatinine, Urine: 44.1 mg/dL

## 2010-05-20 LAB — MAGNESIUM: Magnesium: 2.2 mg/dL (ref 1.5–2.5)

## 2010-05-20 LAB — POCT I-STAT 3, VENOUS BLOOD GAS (G3P V)
pCO2, Ven: 33.5 mmHg — ABNORMAL LOW (ref 45.0–50.0)
pH, Ven: 7.428 — ABNORMAL HIGH (ref 7.250–7.300)
pO2, Ven: 171 mmHg — ABNORMAL HIGH (ref 30.0–45.0)

## 2010-05-20 LAB — BODY FLUID CULTURE

## 2010-05-20 LAB — ANAEROBIC CULTURE

## 2010-05-20 LAB — APTT
aPTT: 37 seconds (ref 24–37)
aPTT: 49 seconds — ABNORMAL HIGH (ref 24–37)

## 2010-05-20 LAB — CARDIAC PANEL(CRET KIN+CKTOT+MB+TROPI): Troponin I: 0.04 ng/mL (ref 0.00–0.06)

## 2010-05-20 LAB — FOLATE: Folate: 16.6 ng/mL

## 2010-05-20 LAB — C3 COMPLEMENT: C3 Complement: 108 mg/dL (ref 88–201)

## 2010-05-20 LAB — CULTURE, BLOOD (ROUTINE X 2)

## 2010-05-20 LAB — PHOSPHORUS: Phosphorus: 3.9 mg/dL (ref 2.3–4.6)

## 2010-05-20 LAB — BRAIN NATRIURETIC PEPTIDE: Pro B Natriuretic peptide (BNP): 102 pg/mL — ABNORMAL HIGH (ref 0.0–100.0)

## 2010-05-20 LAB — SEDIMENTATION RATE: Sed Rate: 118 mm/hr — ABNORMAL HIGH (ref 0–16)

## 2010-05-20 LAB — RETICULOCYTES
RBC.: 3.53 MIL/uL — ABNORMAL LOW (ref 4.22–5.81)
Retic Count, Absolute: 28.2 10*3/uL (ref 19.0–186.0)
Retic Ct Pct: 0.8 % (ref 0.4–3.1)

## 2010-05-20 LAB — HEMOGLOBIN A1C: Hgb A1c MFr Bld: 5.6 % (ref 4.6–6.1)

## 2010-05-20 LAB — FERRITIN: Ferritin: 638 ng/mL — ABNORMAL HIGH (ref 22–322)

## 2010-05-27 ENCOUNTER — Encounter: Payer: Self-pay | Admitting: Infectious Disease

## 2010-05-27 ENCOUNTER — Ambulatory Visit (INDEPENDENT_AMBULATORY_CARE_PROVIDER_SITE_OTHER): Payer: Self-pay | Admitting: Infectious Disease

## 2010-05-27 VITALS — BP 118/80 | HR 96 | Temp 98.1°F | Ht 68.0 in | Wt 393.0 lb

## 2010-05-27 DIAGNOSIS — T847XXA Infection and inflammatory reaction due to other internal orthopedic prosthetic devices, implants and grafts, initial encounter: Secondary | ICD-10-CM

## 2010-05-27 DIAGNOSIS — A491 Streptococcal infection, unspecified site: Secondary | ICD-10-CM

## 2010-05-27 DIAGNOSIS — N189 Chronic kidney disease, unspecified: Secondary | ICD-10-CM

## 2010-05-27 DIAGNOSIS — M009 Pyogenic arthritis, unspecified: Secondary | ICD-10-CM

## 2010-05-27 LAB — CBC WITH DIFFERENTIAL/PLATELET
Basophils Relative: 1 % (ref 0–1)
Eosinophils Absolute: 0.1 10*3/uL (ref 0.0–0.7)
Eosinophils Relative: 2 % (ref 0–5)
Hemoglobin: 12.8 g/dL — ABNORMAL LOW (ref 13.0–17.0)
Lymphs Abs: 1.3 10*3/uL (ref 0.7–4.0)
MCH: 29.9 pg (ref 26.0–34.0)
MCHC: 31.6 g/dL (ref 30.0–36.0)
MCV: 94.6 fL (ref 78.0–100.0)
Monocytes Relative: 7 % (ref 3–12)
Neutrophils Relative %: 60 % (ref 43–77)
Platelets: 218 10*3/uL (ref 150–400)
RBC: 4.28 MIL/uL (ref 4.22–5.81)

## 2010-05-27 LAB — SEDIMENTATION RATE: Sed Rate: 22 mm/hr — ABNORMAL HIGH (ref 0–16)

## 2010-05-27 LAB — BASIC METABOLIC PANEL
BUN: 17 mg/dL (ref 6–23)
CO2: 25 mEq/L (ref 19–32)
Calcium: 9.3 mg/dL (ref 8.4–10.5)
Creat: 1.04 mg/dL (ref 0.40–1.50)
Glucose, Bld: 111 mg/dL — ABNORMAL HIGH (ref 70–99)
Sodium: 140 mEq/L (ref 135–145)

## 2010-05-27 NOTE — Assessment & Plan Note (Signed)
Will recheck a sedimentation rate and C-reactive protein today. Patient's been off antibiotics for quite some time now. If his sedimentation rate is elevated plan to to see a surgeon for consideration of aspiration of the knee. Other than the constant pain there is not much else to suggest ongoing infection although this has been some concern about some time. I'll see the patient back in 6 months time.

## 2010-05-27 NOTE — Assessment & Plan Note (Signed)
This was a most recently isolate a pathogen involved the patient prostatic knee infection.

## 2010-05-27 NOTE — Progress Notes (Signed)
Subjective:    Patient ID: Lavonia Dana, male    DOB: [birth year], 60 y.o.   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 Roxan Hockey and last seen by Dr. Cathie Olden in 2008. Patient had been treated for prosthetic joint infection in 2007 with 6 weeks of IV antibiotics with removal of antibiotic spacer and reimplantation of prosthesis. He was being observed off antibiotics when this April 2010 when he was admited in septic shock due once again to group C streptococci from his knee. He is sp i and d and removal of prosthetic material. He was continued on parenteral antibiotics througout most of the month of Aprl and then discharged on an additional 21 d of rocephin 1 g daily. I put him back on oral antibiotics PCN and then amoxicillin when I first saw him because I was concerned by persistence of erythema in the leg and warmth over the joint space. He also still had slightly elevated inflammatory markers. Apparently he still has a screw that cannot be removed from the leg that could be a nidus of infection.  I and D of MRSA abscess in his neck by Dr. Jeannetta Nap. He had been off of amoxicillin since September of 2010.  At last visit he wished to remain of his suppressive amoxicillin. Dr. Chaney Malling has moved to [facility]. Pt has seen MD in [facility]. He had the patient claims that he did not have a good rapport with the orthopedic surgeon the sought [facility]. He would see Dr. Terrilee Croak up and even 10. When he found out to Dr. Terrilee Croak wouldn't be seeing patients in group [place] he decided that he needed to surgeon here. In other words that the fusion has been considered as still being considered by the patient and he still looking for surgeon to perform and I believe he will go back to [facility] of this is unclear to me. In the interim he has developed worsening pain in his left knee. In the fall I saw him he had "good days and bad days". It hurt at times  with weight loss at times with rest. Now he says it hurts all the time. He does not have erythema about the knee knee and no reaccumulation of a joint effusion he has no fevers chills nausea or malaise.   Review of Systems  Constitutional: Negative for fever, chills, diaphoresis, activity change, appetite change and fatigue.  HENT: Negative for facial swelling, neck pain, neck stiffness and ear discharge.   Respiratory: Negative for apnea, cough, choking, chest tightness and shortness of breath.   Cardiovascular: Negative for chest pain, palpitations and leg swelling.  Gastrointestinal: Negative for abdominal pain, constipation, blood in stool, abdominal distention and anal bleeding.  Genitourinary: Negative for dysuria, enuresis and difficulty urinating.  Musculoskeletal: Positive for arthralgias. Negative for myalgias, back pain, joint swelling and gait problem.  Neurological: Negative for dizziness, syncope, facial asymmetry, numbness and headaches.  Psychiatric/Behavioral: Negative for behavioral problems, confusion and agitation.       Objective:   Physical Exam  Constitutional: He is oriented to person, place, and time. He appears well-developed. No distress.  HENT:  Head: Atraumatic.  Mouth/Throat: No oropharyngeal exudate.  Eyes: Conjunctivae and EOM are normal. Pupils are equal, round, and reactive to light. Right eye exhibits no discharge. Left eye exhibits no discharge. No scleral icterus.  Neck: Normal range of motion. Neck supple.  Cardiovascular: Normal rate.  Exam reveals no  gallop and no friction rub.   No murmur heard. Pulmonary/Chest: Effort normal and breath sounds normal. No respiratory distress. He has no wheezes. He has no rales. He exhibits no tenderness.  Abdominal: He exhibits no distension. There is no tenderness. There is no rebound and no guarding.  Musculoskeletal: He exhibits edema.  Neurological: He is alert and oriented to person, place, and time.  Skin:  Skin is warm and dry. He is not diaphoretic.       He has minimal erythema at the left knee TKA site, no warmth          Assessment & Plan:  INFECTION DUE TO INTERNAL ORTH DEVICE NEC Will recheck a sedimentation rate and C-reactive protein today. Patient's been off antibiotics for quite some time now. If his sedimentation rate is elevated plan to to see a surgeon for consideration of aspiration of the knee. Other than the constant pain there is not much else to suggest ongoing infection although this has been some concern about some time. I'll see the patient back in 6 months time.  STREPTOCOCCUS INFECTION CCE & UNS SITE GROUP C This was a most recently isolate a pathogen involved the patient prostatic knee infection.  CHRONIC KIDNEY DISEASE UNSPECIFIED Check a metabolic panel today.

## 2010-05-27 NOTE — Assessment & Plan Note (Signed)
Check a metabolic panel today.

## 2010-06-05 ENCOUNTER — Telehealth: Payer: Self-pay | Admitting: *Deleted

## 2010-06-05 NOTE — Telephone Encounter (Signed)
Pt called to find out lab results.  RN shared results and encouraged that pt to call the office if symptoms worsen prior to f/u visit in 6 months.  Pt Verbalized understanding.  Jennet Maduro, RN

## 2010-06-23 NOTE — Group Therapy Note (Signed)
[name], [name]                 ACCOUNT NO.:  [id number]   MEDICAL RECORD NO.:  [id number]          PATIENT TYPE:  INP   LOCATION:  2921                         FACILITY:  [facility]   PHYSICIAN:  Monte Fantasia, MD  DATE OF BIRTH:  December 27, 1950                                 PROGRESS NOTE   HISTORY OF PRESENT ILLNESS:  Please refer to the prior discharge summary dictations dated April 12  and April 15 for the course in the hospital for her prior stays.  This  is an addendum to the same.   PRIMARY CARE PHYSICIAN:  Dr. Tracey Harries.   COURSE DURING THE HOSPITAL STAY:  [name] is a 60 year old Caucasian male patient was admitted on  April 4 with the left leg swelling, consistent pain, fever, chills.  The  patient had undergone an incision and drainage on May 14, 2008, after  which he developed atrial fibrillation with rapid ventricular rate and  hypertension, was transferred to intensive Cardiac Care Unit and started  on Cardizem and Levophed drip. The patient was intubated from April 6 to  April 7 and then later extubated.  The patient also underwent hardware  removal for his left knee symptom during his stay in the hospital  secondary to the left leg infection.  The patient was planned to be  discharged over the weekend on April 5; however, in view of the  improving renal insufficiency as per the Nephrology recommendations, the  patient was planned to be discharged on Monday.  On April 19 the patient  had a sudden onset of shortness of breath with hypoxia after having  physical therapy evaluation.  ABG done showed 7.30 with pCO2 of 49 and  pO2 of 62and was tachypneic to 30s to 40s.  The patient was started on  BiPAP and started on full dose with Lovenox pending VQ scan results and  transferred to step-down unit.  The VQ scan done over the past 24 hours  showed high probability of PE.  Also critical care consult was called in  for further assistance regarding the same.  The  patient, over the past  24 hours, has been tapered of BiPAP and at present is on 80% non-  rebreather mask and is saturating 98-99% and is not in any respiratory  distress at present the patient.   PHYSICAL EXAMINATION:  VITAL SIGNS:  On examination today temperature of 97.7, pulse of 89,  respirations of 21, blood pressure of 120/69 and oxygen saturations 98%  on 80% non-rebreather mask.  HEENT:  Neck is supple.  Pupils equal reacting to light.  No pallor, no  lymphadenopathy.  RESPIRATORY:  Air entry is bilaterally diffusely decreased during  expiratory rhonchi.  No rales.  CARDIOVASCULAR:  S1, S2, regular rate and rhythm.  ABDOMEN:  Soft, morbidly obese no guarding or rigidity, tenderness.  EXTREMITIES:  No edema of chronic skin changes plus left knee surgical  wound plus, which is well.  GENERAL:  The patient is morbidly obese, not is any respiratory distress  at present.   LABS:  ABG:  pH of 7.30,  pCO2 of 51.2, pO2 of 94, bicarbonate 24.9.  Sodium  139, potassium 4.2, chloride 107, bicarb of 26, glucose 97, BUN 27,  creatinine 2.74, which has improved from 3.2 and calcium of 8.6.  Cardiac enzymes done, 1 set has been negative.  D-dimer was 8.29.   RADIOLOGICAL INVESTIGATIONS:  Done 1 chest x-ray done on May 27, 2008:  Impression pulmonary  vascular congestion, cardiomegaly, right base atelectasis, VQ scan done  on May 27, 2008:  Impression at least 3 moderate size matched  perfusion defects, high probability of PE.   ASSESSMENT AND PLAN:  1. Respiratory.  The patient has an acute pulmonary embolism at      present, is not in any respiratory distress, comfortable lying in      bed, at present on 80% non-rebreather mask, has been tapered off      over the night with the BiPAP and is saturating 98-99% on 80% non-      rebreather mask.  At present, will titrate the oxygen saturations      to titrate down the oxygen requirements to keep the oxygen      saturations more than  94%.  We will continue with the albuterol      nebulizations q.6 h.  The patient has been started on      anticoagulation with Lovenox 150 mg q.12 h, adjusted for his renal      insufficiency, which is improving gradually.  2. Infectious disease. The patient needs ceftriaxone 1 gram q.24 h for      total of 6 weeks for his left leg cellulitis status post hardware      removal.  We will continue the same.  3. CVS:  The patient is in sinus rhythm.  Will continue Lopressor for      now.  Diltiazem has been discontinued as per cardiology evaluation.  4. Hematology.  The patient has anemia of chronic disease  at present,      the patient's hemoglobin and hematocrit has been stable and the      patient is also started on full-dose Lovenox for anticoagulation as      the patient's VQ scan showed high probability for PE.  5. Metabolic.  The patient has acute renal insufficiency, possibly      secondary to sepsis, which he had on admission, which is improving      well at present.  Nephrology is on board and a creatinine is      improving well from 3.2 to 2.7 at present.  The patient's baseline      creatinine before admission was 0.8.  6. Also the patient has diabetes, for which the blood sugars are well-      controlled on Lantus and sliding scale insulin before meals and at      bedtime.  7. Also the patient has a history of hypothyroidism and he is on      Synthroid.  8. Alimentary.  The patient is at present n.p.o. secondary to      respiratory distress.  Would start him on oral feeds as soon as the      patient's respiratory distress has improved.  9. Neurology.  The patient had acute change in mental status secondary      to hypoxia over the past 24 hours, may be secondary to the higher      respiratory acidosis, was initially on BiPAP.  The patient is      improving on his  well on his mental status at present.  He is      alert, awake, oriented x3 and not in any acute distress.  Would       continue to monitor for now.  10.Deep vein thrombosis and gastrointestinal prophylaxis.  The patient      is on Lovenox.  We will start him on Coumadin to keep a      therapeutic INR of 2 to 3.  We will continue to monitor hemoglobin      and hematocrit in view of the same.  Also the patient is on      Protonix.      Monte Fantasia, MD  Electronically Signed     MP/MEDQ  D:  05/28/2008  T:  05/28/2008  Job:  [id number]

## 2010-06-23 NOTE — Consult Note (Signed)
[name], [name]                 ACCOUNT NO.:  [id number]   MEDICAL RECORD NO.:  [id number]          PATIENT TYPE:  INP   LOCATION:  3103                         FACILITY:  [facility]   PHYSICIAN:  Hillis Range, MD       DATE OF BIRTH:  Jun 01, 1950   DATE OF CONSULTATION:  05/16/2008  DATE OF DISCHARGE:                                 CONSULTATION   REASON FOR CONSULTATION:  AFib with RVR.   PRIMARY CARDIOLOGIST:  Bethel Born, MD   PRIMARY CARE PHYSICIAN:  Dr. Robina Ade.   ORTHOPEDIC SURGEON:  Thereasa Distance A. Chaney Malling, MD   REQUESTING PHYSICIAN:  Critical Care/Pulmonary.   HISTORY OF PRESENT ILLNESS:  A 60 year old morbidly obese Caucasian male  without prior cardiac history who was admitted 4 days ago with  complaints of redness and swelling and pain in the left lower extremity  who was diagnosed with cellulitis and placed on vancomycin.  The patient  was found to be febrile with a T-max of greater than 100.  His left knee  was aspirated by Ortho and on May 13, 2008, and he was sent for an I  and D on May 14, 2008.  Pulmonary Critical Care Medicine was called  postoperatively secondary to ventilator-dependent respiratory failure in  the setting of anemia (blood loss) and hypotension.  The patient was  intubated for 24 hours and extubated on May 15, 2008.  The patient has  received blood transfusions x2.  His hemoglobin this a.m. was 8.0 and he  did receive a third blood transfusion.  The patient continues an AFib  with RVR and has been placed on a Cardizem drip at 5 mg an hour and  digoxin.  The patient states during the last time, he had knee surgery.  He had atrial fibrillation, but it resolved on its own and he did have  some anemia postoperatively as well.  He does admit to being seen by a  cardiologist approximately 8 years ago at which time, he had a  catheterization.  Review records did reveal that the patient did have a  catheterization by Dr. Peter [place] and it revealed  normal coronary  anatomy at that time in 2002.   REVIEW OF SYSTEMS:  Positive for fevers, redness, pain in the left knee,  a regular rhythm, arthralgia, joint swelling, and pain of the left knee.  All other systems are reviewed and found to be negative.   PAST MEDICAL HISTORY:  1. Morbid obesity.  2. Arthritis.  3. Multiple knee surgeries, three surgeries on the right, ten on the      left.  4. GERD.  5. Obstructive sleep apnea with CPAP.  6. Newly diagnosed diabetes.   PAST SURGICAL HISTORY:  Multiple bilateral knee surgeries and revision.   SOCIAL HISTORY:  He lives in [place] alone.  He does not smoke.  He  drinks beer daily.  Does not use drugs.   FAMILY HISTORY:  Mother with CVA.  Father deceased at age 21 from an MI.  He has a sister with cancer.   CURRENT MEDICATIONS:  1.  Vancomycin through pharmacy.  2. Protonix 40 mg daily.  3. Digoxin 0.125.  4. Ferrous sulfate 325 mg daily.  5. Insulin 5 units at bedtime and sliding scale.  6. Unasyn 3 g q.6 h.   ALLERGIES:  No known drug allergies.   CURRENT LABORATORIES:  Hemoglobin 8.0, hematocrit 22.6, white blood  cells 5.7, platelets 151 (hemoglobin on admission 11.3 with a hematocrit  of 32.1).  Sodium 136, potassium 3.5, chloride 106, CO2 of 23, BUN 22,  creatinine 3.43, glucose 117.  BNP 156.  Anemia profile revealing an  iron of 12, total iron-binding capacity 196 with a saturation of 6%.  Chest x-ray revealing cardiomegaly with pulmonary vascular congestion  and left lower lobe density.  EKG revealing AFib with RVR with lateral  nonspecific changes, ventricular rate of 104 beats per minute.   PHYSICAL EXAMINATION:  VITAL SIGNS:  Blood pressure 136/67, pulse 83,  respirations 19, temperature 98, O2 sat 98% on room air.  GENERAL:  He is awake, alert, and oriented, morbidly obese.  HEENT:  Head is normocephalic and atraumatic.  Eyes:  PERRLA.  Mucous  membranes of mouth pink and moist.  Tongue is midline.  NECK:   Supple.  Obese.  No carotid bruits.  JVP is noted at 9 mm.  CARDIOVASCULAR:  Distant heart sounds, irregular rhythm, tachycardiac  without murmurs, rubs, or gallops.  LUNGS:  Bilateral rales and rhonchi up to the apex.  ABDOMEN:  Obese with mild distention, 2+ bowel sounds.  EXTREMITIES:  Bilateral edema in the lower extremities, left greater  than right and postoperative changes in the left knee.  NEUROLOGIC:  Cranial nerves II through XII are grossly intact.   IMPRESSION:  1. Atrial fibrillation with rapid ventricular response, status post      incision and drainage of the left knee  2. Status post cellulitis with infected left knee.  3. Status post incision and drainage on May 14, 2008.  4. Morbid obesity.  5. Recent diagnosis of diabetes.  6. Blood loss anemia with history of iron deficiency anemia.  7. Congestive heart failure.   PLAN:  A 60 year old obese Caucasian male with multiple medical problems  now in AFib with RVR, postoperative I and D of the left knee, anemia  noted with blood transfusions postoperatively, also evidence of fluid  overload.  AFib is multifactorial maybe related to anemia and  postoperative stress.  The patient has been seen and examined by myself  and Dr. Hillis Range.  We agree with diltiazem drip.  We will  discontinue his digoxin secondary to renal  failure.  We will not start heparin drip or Coumadin due to the anemia  and recent blood loss.  Plan echocardiogram for LV function.  We will  use diltiazem drip for rate control and observe for now making further  recommendations throughout hospital course depending upon the patient's  response to treatment.      Bettey Mare. Lyman Bishop, NP      Hillis Range, MD  Electronically Signed    KML/MEDQ  D:  05/16/2008  T:  05/17/2008  Job:  [id number]   cc:   Dr. Arvilla Meres A. Chaney Malling, M.D.

## 2010-06-23 NOTE — Discharge Summary (Signed)
NAMEBERLYN, [name] NO.:  [id number]   MEDICAL RECORD NO.:  [id number]          PATIENT TYPE:  INP   LOCATION:  5530                         FACILITY:  [facility]   PHYSICIAN:  Altha Harm, MDDATE OF BIRTH:  [birth year]   DATE OF ADMISSION:  05/12/2008  DATE OF DISCHARGE:                               DISCHARGE SUMMARY   DISCHARGE DISPOSITION:  [facility].   DISCHARGE DIAGNOSES:  Please refer to the interim discharge summary on  May 20, 2008, for discharge diagnoses. To those discharge diagnoses,  please add the following:  1. Acute pulmonary embolus on chronic anticoagulation, INR 2.6.  2. Atrial fibrillation with rapid ventricular rate.  3. 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   DISCHARGE MEDICATIONS:  Include the following:  1. Ceftriaxone 1 g IV q.24 h. For 21 days.  2. Aranesp 100 mcg subcutaneous Friday.  3. Metoprolol 25 mg p.o. b.i.d.  4. Iron sulfate 325 mg p.o. b.i.d.  5. Lantus 5 units subcutaneously at bedtime.  6. NovoLog sliding scale q.a.c. nightly.  7. Levothyroxine 75 mcg p.o. daily.  8. Nutritional supplements and Nephro-Vite 237 mL p.o. b.i.d.  9. Ambien 5-10 mg p.o. every night p.r.n.  10.Coumadin 7.5 mg p.o. every night.  11.Protonix 40 mg p.o.  Please disregard the discharge medications dictated on April 15 and  place these in place.   CONSULTANTS:  1. Pulmonary critical care.  2. Cardiology.   Please note that this summary summarizes the patient's hospital course  from April 21 up until today.   1. Acute pulmonary embolus.  The patient was noted to have acute      hypoxic episode and was given supportive care with oxygen.  A VQ      scan showed high probability for pulmonary embolus, and the patient      was started on anticoagulation with Coumadin and Lovenox.  The      patient had an overlap of Lovenox and Coumadin for a total of 5      days with INRs being  therapeutic for 3 days of that overlap up      until present.  The current INR today is 2.6.  The dosing of      Coumadin is for 7.5 mg p.o. every night.  Recommendation is that      the patient being on lifelong Coumadin.  2. Septic joint with hardware removal.  The patient has been placed on      Rocephin 1 g IV daily.  Needs to complete another 21 days of      Rocephin to complete his course of therapy.  3. Generalized weakness.  The patient does have generalized weakness      and has been in physical therapy and occupational therapy.  He has      made steady but slow progress and has diminished endurance which      has been improving daily.  The patient is concerned about necessary      lab changes now  that he is on Coumadin and had to have removal of      the joint hardware.  I would recommend when he goes to the skilled      nursing facility the patient be seen by possibly a vocational      therapist to help with planning for possible career changes upon      discharge.  4. Obstructive sleep apnea.  The patient has not had a formal study of      the polysomnogram outside of the hospital but has been on CPAP here      in the hospital which has been assisting with his night-time      oxygenation.  I would recommend the patient continue on CPAP at the      nursing home.  A polysomnogram can be arranged for him as an      outpatient.  5. Chronic renal failure.  The patient had a creatinine of 1.92 which      is stage I chronic kidney disease.  That should be monitored on a      monthly basis.   This brings [place] up to date to the patient's hospital course to this  point.   DIETARY RESTRICTIONS:  The patient should be on a heart-healthy diabetic  diet geared towards weight loss.   PHYSICAL RESTRICTIONS:  As per physical therapy.   RECOMMENDATIONS FOR LABORATORIES:  Will recommend that the patient have  an INR checked within 48 hours to ensure continued stability of the INR  in the  outpatient setting.   Total time for this discharge summary 42 minutes.      Altha Harm, MD  Electronically Signed     MAM/MEDQ  D:  06/04/2008  T:  06/04/2008  Job:  [id number]

## 2010-06-23 NOTE — Consult Note (Signed)
[name], BIENVENUE                 ACCOUNT NO.:  [id number]   MEDICAL RECORD NO.:  [id number]          PATIENT TYPE:  INP   LOCATION:  3103                         FACILITY:  [facility]   PHYSICIAN:  James L. Deterding, M.D.DATE OF BIRTH:  Mar 21, 1950   DATE OF CONSULTATION:  DATE OF DISCHARGE:                                 CONSULTATION   REFERRING PHYSICIAN:  [facility] D.   REASON FOR CONSULT:  Acute kidney injury.   HISTORY OF PRESENT ILLNESS:  This is a 60 year old gentleman with  history of morbid obesity, history of recurrent knee arthritis, and  status post multiple bilateral procedures last being on the left in  August 2007.  He also has a history of infected left total knee.  He was  admitted on April 4 after 2 days of nausea, vomiting, diarrhea, then  developed fevers, pain, swelling, and redness of the left knee.  Previously brought in hypotensive, could not move.  Subsequently, and is  hypotensive, blood pressure in the 80s.  He underwent a drainage and  synovectomy on April 6 of the left knee.  He has been on vancomycin and  Unasyn.  Blood pressure has been as low as 80s, now it is 110-120.  He  developed atrial fib with a rapid ventricular response on April 8.  He  has been on Cardizem.  He required intubation postop because of some  hypoventilation, now he is extubated.   No family history of renal disease.  No family history of eye disease.  No musculoskeletal disease or hearing defects.  No history of  hypertension or diabetes in the patient.  He was on ibuprofen at home.  Creatinine was 0.9 on April 6; and then went to 1.94 on April 7; 3.46 on  April 8; and his current level of 4.2.   PAST MEDICAL HISTORY:  As above with multiple knee procedures.  History  of obesity.   He has no known drug allergies.   SOCIAL HISTORY:  He is a nonsmoker, nondrinker.   REVIEW OF SYSTEMS:  HEENT:  He has had no visual difficulties, headache,  soreness, or mouth dryness.   PULMONARY:  No history of asthma.  GI:  No  nausea, vomiting, or diarrhea.  No indigestion or heartburn.  No history  of hepatitis or yellow jaundice.  SKIN:  He occasionally has some very  dry skin and some blistering rash.  MUSCULOSKELETAL:  The arthritis in  his knees and his back.  He denies ankle edema.  Denies PND or  orthopnea, sleeps with 1 pillow.  He has nocturia x1-2 depending on how  much he drinks.   FAMILY HISTORY:  Positive for pancreatic cyst, cancer in sister, another  sister has breast cancer, and another one has liver cancer.  His father  died of a stroke in his 74s.  Mother is alive at age 57 in a nursing  home.  He has 10 siblings and multiple of them have cancer.  No family  history of hypertension or diabetes.   OBJECTIVE/PHYSICAL EXAMINATION:  GENERAL:  A very pleasant gentleman,  oriented  x3, cooperative.  VITAL SIGNS:  Blood pressure 110/47, heart rate is 96, and temp 99.  HEENT:  Pharynx unremarkable.  Fundi are benign.  NECK:  Without masses.  No thyromegaly.  LUNGS:  Decreased breath sounds, decreased respiratory percussion,  decreased expansion, but no rales, rhonchi, or wheezes.  CARDIOVASCULAR:  Regular rhythm now, grade 2/6 holosystolic, best heard  at left sternal border.  PMI is 11 cm lateral to the midsternal line in  fifth intercostal space.  He has 2+ edema.  Pulses 2+/4+.  No bruits are  noted.  No lifts, heaves, or thrills.  ABDOMEN:  Positive bowel sounds, obese.  Liver is down to 4-5 cm.  Abdomen is soft, nontender.  EXTREMITIES:  He has got a bandage over his left knee.  He has an edema  as mentioned above.  No other lesions noted.  NEUROLOGIC:  Cranial nerves II through XII grossly intact.  Motor is 4/5  and symmetric.  Deep vein reflexes 1+/4+ in upper extremities, did not  check lower.   LABORATORY DATA:  Iron saturation 6%.  TSH 6.7.  Sodium 132, potassium  3.4, chloride of 100, bicarbonate 24, creatinine 4.2, BUN of 26, and  glucose  114.  He has I's and O's now 125-150 mL an hour.  I's and O's 2  days ago with 3863/2455, on May 16 3825/4325.   ASSESSMENT:  1. Acute kidney injury, nonoliguric.  It is most likely secondary to      hypotension in the setting of sepsis and volume depletion and with      a nonsteroidal inhibiting his ability to auto-regulate his renal      blood flow.  He also cannot rule out acute interstitial nephritis      from drugs at this current time or acute glomerulonephritis from      infection.  We will evaluate his urine sediment as well as      complements and differential to help evaluate that.  His volume has      increased mild, but his oxygen saturations are okay.  I do not feel      we should push lot of volume.  His acid-base potassium is okay and      is non-uremic.  Need to remember his fluid at this time, assess his      urine sediment, dose adjust medication, check his renal anatomy,      and check his sodium avidity.  2. Pyarthrosis.  He is on antibiotics and has had drainage.  3. Anemia.  His needs iron intravenous.  He is on EPO.  4. Diabetes mellitus.  5. Massive obesity.  6. Hypothyroid per the Primary Service.   PLAN:  As above.  Check ultrasound, C3, C4, differential UA, sodium and  creatinine, dose adjust his medication, and follow his chemistry.           ______________________________  Llana Aliment Deterding, M.D.     JLD/MEDQ  D:  05/17/2008  T:  05/18/2008  Job:  [id number]

## 2010-06-23 NOTE — Discharge Summary (Signed)
NAMEVINSON, TIETZE                 ACCOUNT NO.:  [id number]   MEDICAL RECORD NO.:  [id number]          PATIENT TYPE:  INP   LOCATION:  4739                         FACILITY:  [facility]   PHYSICIAN:  Monte Fantasia, MD  DATE OF BIRTH:  02-26-1950   DATE OF ADMISSION:  05/12/2008  DATE OF DISCHARGE:                               DISCHARGE SUMMARY   ADDENDUM   Please refer to the prior discharge summary dictated by Dr. Christella Noa,  on May 20, 2008.  This is an addendum to the same.   PRIMARY CARE PHYSICIAN:  Tracey Harries, MD   DISCHARGE DIAGNOSES:  Please refer to the prior discharge diagnoses  dictated by Dr. Christella Noa, on May 20, 2008, there is no change in the  discharge diagnoses.   MEDICATIONS UPON DISCHARGE:  1. Ceftriaxone 1 g IV q.24 h. for 32 days.  2. Darbepoetin alfa 100 mcg subcutaneous q. Friday.  3. Diltiazem 240 mg p.o. daily.  4. Ferrous sulfate 325 mg p.o. b.i.d.  5. Lantus 5 units subcutaneous at bedtime.  6. NovoLog sliding scale q.a.c. nightly.  7. Levothyroxine 75 mcg p.o. daily.  8. Lopressor 12.5 mg p.o. b.i.d.  9. Nutritional supplements and Nephro-Vite 237 mL p.o. b.i.d.  10.Protonix 40 mg p.o. q.12 h.   COURSE DURING THE HOSPITAL STAY:  Please refer to the prior discharge  summary dictated for the course during the hospital stay on May 20, 2008.  This is an addendum to the same been, I have been taking care of  the patient since May 22, 2008.  The patient has been on an improving  trend of his renal insufficiency, which was noted over past few days as  per the Nephrology on board.  BMET was monitored daily and at present,  the creatinine has improved from 4.48-3.5 today.  As per discussions  with Dr. Hyman Hopes, we will continue to monitor the BMET.  We will need to  repeat renal panel on Monday, May 27, 2008, and please inform the  renal panel to Dr. Elvis Coil, pager number [phone] and his cell  number is [phone].  Also, discussed with ID  regarding the duration  of antibiotics for his left leg cellulitis, status post hardware removal  on the left knee.  As per discussions with ID, the patient's IV  vancomycin and Unasyn was discontinued and changed to ceftriaxone 1 gram  daily.  The patient needs antibiotic treatment for total of 3 weeks and  had been on 10 days of vancomycin and Unasyn, during the stay in the  hospital and will need additional 32 days of antibiotics to complete the  course of treatment.  The patient at present needs an inpatient rehab  and needs to be placed in a skilled nursing facility for the same.   LABORATORIES DONE DURING THE STAY IN THE HOSPITAL:  Upon discharge;  total WBC 6.7, hemoglobin 8.8, hematocrit 25.7, and platelets of 458.  Sodium 142, potassium 3.5, chloride 110, bicarb 24, glucose 99, BUN 28,  creatinine 3.55, total bilirubin 0.5, alkaline phosphatase 64, AST 20,  ALT 25, total  protein 5.9, albumin 1.8, and calcium of 7.8.  The patient  was also been treated during the stay in the hospital for his pneumonia,  has completed the course of Unasyn for 10 days and Unasyn was  discontinued.  The patient at present is medically stable to be  discharged and can be discharged to the skilled nursing facility in a.m.   DISPOSITION:  The patient at present can be discharged in a.m. to the  skilled nursing facility for further rehab.  Also, the patient needs to  have a repeat renal panel done on Monday, May 27, 2008, and please  inform the labs and the renal panel values to Dr. Elvis Coil,  nephrologist, his number is pager number is [phone] and his cell number  is the [phone].      Monte Fantasia, MD  Electronically Signed     MP/MEDQ  D:  05/23/2008  T:  05/24/2008  Job:  [id number]

## 2010-06-23 NOTE — H&P (Signed)
[name], [name]                 ACCOUNT NO.:  [id number]   MEDICAL RECORD NO.:  [id number]          PATIENT TYPE:  INP   LOCATION:  2301                         FACILITY:  [facility]   PHYSICIAN:  Della Goo, M.D. DATE OF BIRTH:  1950/03/06   DATE OF ADMISSION:  05/12/2008  DATE OF DISCHARGE:                              HISTORY & PHYSICAL   PRIMARY CARE PHYSICIAN:  Dr. Robina Ade.   CHIEF COMPLAINT:  Pain, swelling, redness of the left leg.   HISTORY OF PRESENT ILLNESS:  This is a 60 year old male who presents to  the emergency department with complaints of worsening swelling, redness  and pain of his right lower leg.  He states that he has also had  weakness and dizziness along with fevers and chills over the past 2  days.  He denies having any nausea, vomiting, diarrhea, chest pain or  shortness of breath.   PAST MEDICAL HISTORY:  Significant for osteoarthritis, morbid obesity  and several left knee surgeries.   ALLERGIES:  No known drug allergies.   SOCIAL HISTORY:  The patient is a nonsmoker.  He does report drinking  alcohol one to two beers daily.   FAMILY HISTORY:  Negative for diabetes mellitus, coronary artery  disease, hypertension, but positive for cancer in his sisters; one  sister has liver cancer and another sister had pancreatic cancer.   REVIEW OF SYSTEMS:  Pertinents are mentioned in the HPI.  All other  organ systems are negative.   PHYSICAL EXAMINATION:  GENERAL:  This is a morbidly obese 60 year old  male in acute distress.  VITAL SIGNS:  Temperature 102.4, blood pressure initially 109/50 and  this decreased to 87/59, heart rate initially 141, now it is 125,  respirations 20 and O2 saturation 94-95%.  HEENT:  Normocephalic, atraumatic.  Pupils equally round, reactive to  light.  Extraocular movements are intact.  Funduscopic benign.  Oropharynx is clear.  NECK:  Supple, full range of motion.  No thyromegaly, adenopathy,  jugular venous distention.  CARDIOVASCULAR:  Tachycardic rate and rhythm.  No murmurs, gallops or  rubs.  LUNGS:  Clear to auscultation bilaterally.  ABDOMEN:  Positive bowel sounds, soft, nontender, nondistended.  EXTREMITIES:  Without cyanosis, clubbing or edema in the right lower  extremity.  The left lower extremity has a large well-healed midline  scar above the prepatella area extending across the prepatella which is  consistent with his previous knee surgeries.  There is confluent  erythema of the distal two-thirds of the left lower extremity.  The  patient also has streaking redness extending above the patella into the  thigh.  This area is inflamed and painful to palpation.  NEUROLOGIC:  Generalized weakness.  The patient is able to move all four  of his extremities.  There are no sensory deficits.   LABORATORY STUDIES:  White blood cell count 9.6, hemoglobin 11.7,  hematocrit 33.2, platelets 143, sodium 136, potassium 3.5, chloride 103,  bicarb 24, BUN 19, creatinine 1.14, glucose 139.  Urinalysis reveals  moderate hemoglobin and protein equal to 100, lactic acid level 2.1.  Pro-time is 17.9,  INR 1.4, PTT 49.  Chest x-ray reveals a right lower  lobe pneumonia.   ASSESSMENT:  A 60 year old male being admitted with:  1. Cellulitis of the left lower extremity.  2. Sepsis.  3. Septic shock.  4. Early right lower lobe pneumonia.  5. Morbid obesity.   PLAN:  The patient will be admitted to the ICU.  Antibiotic therapy has  already been started by the EDP with vancomycin.  Unasyn therapy will be  added as well.  IV fluids have been ordered for fluid resuscitation and  DVT and GI prophylaxis will be ordered.  Further workup will ensue  pending results of the patient's clinical course and his studies.      Della Goo, M.D.  Electronically Signed     HJ/MEDQ  D:  05/12/2008  T:  05/12/2008  Job:  [id number]

## 2010-06-23 NOTE — Discharge Summary (Signed)
NAMESEAVER, MACHIA                 ACCOUNT NO.:  [id number]   MEDICAL RECORD NO.:  [id number]          PATIENT TYPE:  INP   LOCATION:  3103                         FACILITY:  [facility]   PHYSICIAN:  Herbie Saxon, MDDATE OF BIRTH:  Jun 05, 1950   DATE OF ADMISSION:  05/12/2008  DATE OF DISCHARGE:                               DISCHARGE SUMMARY   Medications to be dictated on final discharge.   PRIMARY CARE PHYSICIAN:  Tracey Harries, MD.   DISCHARGE DIAGNOSES:  1. Acute respiratory failure status post ventilator dependence,      improved.  2. Left lower lobe pneumonia.  3. Obstructive sleep apnea on bilevel positive airway pressure at      night.  4. Morbid obesity.  5. New onset atrial fibrillation.  6. Controlled ventricular rate currently.  7. Septic shock, resolved.  8. Elevated AST.  9. Left knee polyarthrosis.  10.Left leg cellulitis, improved.  11.The patient is status post left knee incision and drainage.  12.Status post left knee hardware removal, spacer placement.  13.Iron-deficiency anemia.  14.Hypokalemia, repleted.  15.Mild hyponatremia, resolved.  16.Borderline diabetes.  17.Acute nonoliguric kidney injury.  18.Protein calorie malnutrition with hypoalbuminemia.  19.Urinary tract infection.   CONSULTS:  1. Sanjuana Mae, MD, Nephrology.  2. Hillis Range, MD, Cardiology.  3. Orthopedic Surgeon, Lenard Galloway. Mortenson, MD.   PROCEDURE:  The patient had an incision and drainage of the left knee  with synovectomy performed on May 14, 2008, by Dr. Chaney Malling.   HOSPITAL COURSE:  This 60 year old Caucasian gentleman was admitted with  left leg swelling, redness, and pain consistent with fever and chills 2  days prior to presentation.  The patient underwent an I&D on May 14, 2008, however postoperatively, he developed atrial fibrillation with  rapid ventricular rate and hypertension.  He was transferred to the  intensive care unit, started on Cardizem  and Levophed infusion,  intubated from May 14, 2008 to May 15, 2008, when he was extubated.  The patient also had 700 mL blood loss during the procedure.  She had  been started on IV Unasyn and Vancomycin.  On April, 4, 2010, anaerobic  culture was growing gram-positive cocci and the patient was noted to be  having worsening renal function on May 16, 2008, and Dr. Darrick Penna was  consulted on May 17, 2008.  The patient did have 2 units of packed red  blood cell transfused.  He was also started on iron supplementation for  his iron deficiency, also started on Aranesp, was weaned off the  Cardizem drip on May 17, 2008.  Dr. Johney Frame, cardiologist continues to  follow him.  According to him, his atrial fibrillation is stable.  Hyponatremia and hypokalemia have been repleted.  Vascular Doppler  performed, which was negative for DVT and left leg ultrasound performed  showed extensive subcutaneous edema.  Renal ultrasound performed was  normal.  Free T4 and free T3 were noted to be low.  On May 18, 2008,  the patient has been started on levothyroxine supplementation.  The  patient is currently on fluid restriction less than  1.5 L per day per  nephrologist recommendation.  Clinically, he is asymptomatic today.  Primary care physician is Dr. Everlene Other who is following up 5-7 days after  discharge.   PHYSICAL EXAMINATION:  GENERAL:  He is a middle-aged man, obese, not in  acute distress.  VITAL SIGNS:  Temperature 98, pulse 95, respiratory rate is 23, and  blood pressure 136/77.  HEENT:  Clinically pale.  No Jaundice, not in acute respiratory  distress.  CHEST:  Bilateral air entry reduced at the bases.  HEART:  Heart sounds 1 and 2.  Regular rate and rhythm.  ABDOMEN:  Soft and nontender.  Bowel sounds present.  EXTREMITIES:  Left leg erythema and edema is reduced.  Indurated left  calf edema.  No erythema.  Pretibial edema bilaterally, left worse than  right.   LABORATORY DATA:  Chemistry  shows a sodium 137, potassium 3.8, chloride  104, bicarbonate 25, glucose 104, BUN 34, and creatinine 4.4.  Complete  blood count; WBC is 8, hematocrit 24, and platelet count is 316.   Discharge medications will be dictated as the hospital course  progresses.      Herbie Saxon, MD  Electronically Signed     MIO/MEDQ  D:  05/20/2008  T:  05/21/2008  Job:  [id number]

## 2010-06-23 NOTE — Discharge Summary (Signed)
[name], [name]                 ACCOUNT NO.:  [id number]   MEDICAL RECORD NO.:  [id number]          PATIENT TYPE:  INP   LOCATION:  5530                         FACILITY:  [facility]   PHYSICIAN:  Michelene Gardener, MD    DATE OF BIRTH:  05/17/1950   DATE OF ADMISSION:  05/12/2008  DATE OF DISCHARGE:  06/06/2008                               DISCHARGE SUMMARY   ADDENDUM   For details about this hospitalization, please refer to prior dictated  discharge summaries on April 12, April 15, April 20 and April 27.   Since the last dictation done by Dr. Marthann Schiller on June 04, 2008, the patient has been complaining of mild dizziness and shortness  of breath.  The patient had a hemoglobin between 8 and 8.6 during this  hospitalization.  Last hemoglobin was checked on April 25 and it was  8.6.  Actually that has been an improvement from the last one, which was  8.71m on April 22.  Because of increasing shortness of breath, I  rechecked his CBC and his hemoglobin came to be 8.8 at this time.  I  transfused him 1 unit of packed RBC because of the fact that he has been  on Coumadin and with his other multiple comorbidities, I am trying to  set his hemoglobin a little bit higher, at least above 9.  The patient  did not have any evidence of acute bleeding.  Post transfusion  hemoglobin was 9.1.  As a matter of fact, the patient felt better after  the transfusion and today at the time of discharge he denied any more  dizziness and no more shortness of breath.  I had a long discussion with  him regarding his current condition and I explained to him that he might  continue to have episodes of shortness of breath and that is  multifactorial secondary primarily due to his underlying pulmonary  embolism that has been complicated by other factors which include  obesity hypoventilation syndrome in addition to possible sleep apnea.  I  encouraged him as before to follow with his primary doctor to have  a  sleep study to rule out sleep apnea.  He also remained hypoxic with  oxygen saturation in the mid 90s on 3 liters.  His oxygen drops down to  around 90 in room air and actually dropped more with movements.  I will  recommend to keep him on 3 liters of oxygen and follow with his primary  doctor to taper down his oxygen if tolerated.  His repeat basic  metabolic panel showed improvement in his kidney function with his  creatinine that went from 1.92 to 1.81.   The patient will be discharged today in stable condition.  He will be  followed at [facility] to complete his rehab workup.  The patient needs to follow with orthopedic as an outpatient and I  encouraged him to call orthopedic to set an appointment and he needs  also to follow with his primary doctor.  Otherwise other medical  conditions remained stable since the last  dictation of discharge  summary.  The total assessment time is 40 minutes.      Michelene Gardener, MD  Electronically Signed     NAE/MEDQ  D:  06/06/2008  T:  06/06/2008  Job:  [id number]

## 2010-06-23 NOTE — Op Note (Signed)
NAMEARAFAT, COCUZZA                 ACCOUNT NO.:  [id number]   MEDICAL RECORD NO.:  [id number]          PATIENT TYPE:  INP   LOCATION:  2550                         FACILITY:  [facility]   PHYSICIAN:  Rodney A. 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   OPERATION:  Incision and drainage of the left knee with drainage of  about 300 mL of purulent material; synovectomy knee; removal all total  knee components including glue left knee; insertion of antibiotic spacer  using tobramycin and vancomycin and the antibiotic spacer, methyl  methacrylate.   ANESTHESIA:  General.   SURGEON:  Rodney A. Chaney Malling, MD.   ASSISTANT:  Dr. Cleophas Dunker and Duffy.   PROCEDURE:  The patient was placed on the operating table in the supine  position.  The patient is very very large, very difficult to position.  The entire left lower extremity was prepped with Betadine and draped out  in the usual manner.  Because of very large leg, a sterile tourniquet  was used in the upper part of the leg.  Initially, the tourniquet was  elevated, but this did cause the interspace and tourniquet was removed.  This incision was made starting below the tib-fib and carried above the  patella.  The skin edges were retracted.  The bleeders were coagulated.  The leg was massively swollen.  There is a great deal of edema fluid in  the leg.  Finally, the extensor retinaculum was identified and isolated,  long medial parapatellar incision made through the previous surgical  approach.  Patella was everted laterally.  There was a 300-400 mL of  grossly thick foul-smelling purulent material in the knee.  This was  completely drained.  At this point, attention was first turned  to the  poly insert after all soft tissue was freed up.  This dissection took an  extended period of time to get to the knee and get down to the poly.  The poly had to be cut in quarters to be able to remove the poly.  Once  this was accomplished, attention was then turned to the distal femur.  A  great deal of time was spent with saw freeing up the distal femoral  component.  Finally femoral component could be removed as a single large  entity.  There is a great deal of glue over the end of the femur and up  in the femoral canal.  A great deal of time was spent taking off all the  previous methyl methacrylate.  Far up in the thigh, there was a portion  of methyl methacrylate that simply was not reachable.  Attention was  then turned to the tibial side.  Using a wedge osteotome was then sawed,  the glue underneath the plate of the tibia was removed, and this was  gradually freed up, and the tibial  plate was removed.  Again this had a  great deal of the glue from the previous surgery and this was removed  with an osteotome.  Again this took an extended period of time to  accomplish this and a second surgeon was brought in to help assist as  this is a very difficult procedure.  All the glue was removed as much as  possible.  Once this was accomplished, an extensive synovectomy had been  done previously and further debridement was done.  The pulsating lavage  was then used and irrigated with large amounts of saline.  Once this was  accomplished to my satisfaction, 2 batches of glue were mixed with 2 g  of tobramycin and 2 g of vancomycin in each batch.  The femoral batch  was mixed first.  Once this was getting sticky, it was molded over the  end of the femur and a femoral form was placed over the distal end of  femur to mold the glue to resemble articulating femoral condyles.  This  is held in position until this got fairly firm and then removed.  An  excellent molding of the methyl  methacrylate which looked like femoral  condyles and can act as articulating surfaces was accomplished.  Next,  attention was turned to the tibia.  A 17.5 poly was selected.  A second  batch of glue was mixed again and once this started setting up, this was  placed over the cut end of the tibia and packed down in the tibia canal,  and then the poly was inserted.  The leg was held extracted and the  space was built up with extra poly and held the position until this  setup.  Extra methyl methacrylate was removed.  Once this was  accomplished, good stability to varus-valgus stress was achieved and  good alignment.  The knee could flex easily to 90 degrees.  The wound  was irrigated again with a pulsating lavage.  All debris was removed.  The knee was then closed in layers using a very heavy Vicryl.  Finally  to close the superficial layers, large retention suture was put in place  and staples were used.  A large bulky pressure dressing was applied and  the patient returned to the recovery room in excellent condition.  This  seemed extremely complex extended case.   SURGICAL TIME:  About 4 hours.   DRAINS:  None.   COMPLICATIONS:  700 mL of blood loss.  Intraoperatively, the patient did  develop atrial fibrillation and hypertension, and was sent to unit for  observation postoperatively.      Rodney A. Chaney Malling, M.D.  Electronically Signed     RAM/MEDQ  D:  05/14/2008  T:  05/15/2008  Job:  [id number]

## 2010-06-26 NOTE — Op Note (Signed)
NAMETRYSTIAN, [name]                 ACCOUNT NO.:  [id number]   MEDICAL RECORD NO.:  [id number]          PATIENT TYPE:  INP   LOCATION:  5030                         FACILITY:  [facility]   PHYSICIAN:  Lenard Galloway. 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   OPERATION PERFORMED:  Revision of patellar component with insertion of a 38  mm oval domed patella three pegged, glued in; tibial poly exchange  converting from a 12.5 to a 17.5 mm polyp meter insert, left knee.   SURGEON:  Lenard Galloway. Chaney Malling, M.D.   ANESTHESIA:  General.   ASSISTANT:  1.  Claude Manges. Cleophas Dunker, M.D.  2.  Jamelle Rushing, P.A.   DESCRIPTION OF PROCEDURE:  The patient was placed on the operating table in  the supine position with a pneumatic tourniquet about the left upper thigh.  After satisfactory general endotracheal anesthesia, the left lower extremity  was prepped with DuraPrep and draped out in the usual manner.  A Vi-drape  was placed over the operative site.  The leg was wrapped out with an  Esmarch, tourniquet was elevated.  An incision was made through the previous  incision.  A long median patellar, parapatellar incision was made.  Bleeders  were coagulated.  Sutures along the median parapatellar incision in the  capsule were removed.  A long median parapatellar incision was made and the  patella was everted.  The patella was obviously loose.  The patella could  easily be removed with finger pressure.  The patella was everted.  Soft  tissue was removed.  The posterior aspect of the patella was recut with a  power saw to level the patellar surface out.  Attention was then turned to  the  femoral and tibial components and these were examined very carefully.  Both patellar and femoral components were glued in, they were very stable.  There was no toggle, no loosening and no other abnormality.  Fixation was  excellent.  With the knee flexed, an osteotome was placed underneath the  tibial poly and the stem was amputated and the poly was then removed.  Prior  to this, with varus and valgus stress was some toggling.  It was felt that a  tighter or larger poly insert was preferable.  Once the poly was removed,  the knee was irrigated with copious amounts of saline solution.  All debris  was removed.  Several different thickness trial polys were inserted and the  17.5 poly gave full flexion, full extension, excellent balancing of the  collateral ligaments both in flexion and extension and this was felt to be  ideal.  The final poly insert was inserted into the tibial tray in the  standard manner.  Once it was accomplished, the knee had good range of  motion, no  tendency to sublux anteriorly, no torque of the poly itself.   Attention was then turned to the patella once again.  The metal back patella  component had been removed and the posterior aspect of the patella freshened  up, all soft tissue removed.  Using a drill guide, three drill holes were  placed with three pegs in the oval domed patella.  Glue was mixed.  Glue was  placed on the posterior aspect of the patellar dome and placed against the  cut surface of the patella and held in place with a patellar clamp.  Excess  glue was removed.  Once the glue had cured and hardened, an attempt was made  to loosen the inserted poly and this was very nicely stabilized with glue.  There was no toggling or loosening whatsoever.  The knee was put through a  full range of motion.  The patellar tracked very nicely in the midline.  The  Hemovac drain was inserted.  Tourniquet was dropped.  All bleeders were  coagulated.  The long median  parapatellar incision was closed with heavy  interrupted Ethibond sutures.  Vicryl was used to close the subcutaneous  tissue and stainless steel staples used to close the skin.  Sterile  dressings were applied.  The patient was then transferred to the recovery  room in excellent condition.  Technically this procedure went very well.  I  was very pleased with the final outcome.       RAM/MEDQ  D:  11/19/2003  Garrett:  11/19/2003  Job:  [id number]

## 2010-06-26 NOTE — Op Note (Signed)
[name], [name]                 ACCOUNT NO.:  [id number]   MEDICAL RECORD NO.:  [id number]          PATIENT TYPE:  INP   LOCATION:  2899                         FACILITY:  [facility]   PHYSICIAN:  Lenard Galloway. Mortenson, M.D.DATE OF BIRTH:  1950/05/29   DATE OF PROCEDURE:  04/07/2004  DATE OF DISCHARGE:                                 OPERATIVE REPORT   PREOPERATIVE DIAGNOSIS:  Severe osteoarthritis, right knee.   POSTOPERATIVE DIAGNOSIS:  Severe osteoarthritis, right knee.   OPERATION:  Total knee replacement on the right, computer-assisted insertion  with a large right cemented femoral component, a size #5NBT keel tibial  tray, with a cemented large patella component and a large size LCS tibial  insertion 10 mm.   ANESTHESIA:  General.   SURGEON:  Rodney A. Chaney Malling, M.D.   ASSISTANT:  Claude Manges. Cleophas Dunker, M.D. and Legrand Pitts. Duffy, P.A.   DESCRIPTION OF PROCEDURE:  The patient was placed on the operating room  table in the supine position with the pneumatic tourniquet about the right  upper thigh.  The right lower extremity was prepped with Duraprep and draped  out in the usual manner.  A Vi-drape had been placed over the operative  site.  The leg was then wrapped out with an Esmarch.  The tourniquet was  elevated.  An incision was made starting above the patella and carried down  to the tibial tubercle.  Bleeders were coagulated.  A long parapatellar  incision was made.  The patella was everted.  The knee was then flexed 90  degrees.  Both the medial and lateral meniscus were excised.  The medial  soft tissues off the tibia were released with the Cobb, as the patient had  significant varus.  The anterior and posterior cruciate ligament were  released from the femoral notch area to give better access to the knee.  Part of the patella fat pad was removed.  A synovectomy was done in the  superior patellar pouch.  At this point we had excellent access and  visualization of the femur  and tibia.  A computer was used throughout the  procedure and using computer prompting, the tibial cuts were made following  the prompting in the sequential order.  In a similar manner, using computer  prompting, the distal femoral cut was made.  The ligaments were balanced  very nicely in flexion and extension following the computer prompting.  Once  the second femoral cut was made, the final chamfer guide was placed over the  distal end of the femur and centered.  It was pinned in place, but distant  chamfer cuts were made.  Drill holes were made in the distal end of the  femur.  Excellent positioning of the femoral cuts was made.   Attention was then turned to the proximal tibia.  Using [place]ale, the tibia  subluxed anteriorly.  A size #5 trial seemed to fit very nicely along the  line on the proximal end of the tibial cut.  This was pinned in place and a  tower supplied for the drill hole was placed  in the proximal end of the  tibia.  The tower was removed and the wing broach was passed down through  the tibial trial.  This was driven down flush.  The plastic peg was inserted  and the tibial trial was inserted.  The trial femoral component was then  selectively placed over the distal end of the femur and driven home.  The  knee was put through a full range of motion.  There was excellent alignment  to both varus and valgus.  There was full flexion and full extension.  The  ligaments balanced very nicely in flexion and extension.   Attention was then turned to the posterior aspect of the patella.  The  patellar clamp was placed on the posterior aspect of the patella and this  was amputated.  The patellar guide for the three drill holes was made, and  three drill holes were made in the patella.  The patellar trial was inserted  and there was a lateral tilting of the patella and the lateral release was  done.  Once this was completed, there was excellent excursion and balancing  of the  patella in the midline.  All the components were removed.  A  pulsating lavage was used.  All debris was removed.  Once this was  accomplished, glue was mixed.  Each component was then sequentially  inserted.  The tibia was inserted first in the standard manner.  Excess glue  was removed with small curets.  The glue was placed over the __________ into  the femur and the posterior aspect of the femoral component.  The femoral  component was then driven home and seated very nicely.  Excess glue was  removed.  The poly trial was inserted and the knee was put in full extension  and placed on the Mayo.  Glue was placed in the posterior aspect of the  patella, and this was held in position with a patella clamp.  The glue was  allowed to cure and harden.  Once this was accomplished, the knee was flexed  and a small osteotome was used to remove all the excess glue.  The tibial  poly was then removed.  The tourniquet was dropped.  All bleeders were  coagulated.  Fairly good hemostasis was achieved.  The knee was irrigated  again with a pulsating lavage.  The final poly was snap-fit inserted, and  the knee was put through a full range of motion with full flexion and full  extension, excellent valgus and the ligaments in both positions, with the  patella tracking very nicely.  The long medial parapatellar incision was  then closed with interrupted heavy Ethibond.  The Hemovac drain was then  inserted.  Vicryl was used to close the subcutaneous tissue, and stainless  steel staples were used to close the skin.  Sterile dressings were applied.   The patient was returned to the recovery room in excellent condition.  The  technique went extremely well.  I was very pleased with the surgical results  and balancing.      RAM/MEDQ  D:  04/07/2004  T:  04/07/2004  Job:  [id number]

## 2010-06-26 NOTE — Discharge Summary (Signed)
NAMEJOSMAR, [name]                 ACCOUNT NO.:  [id number]   MEDICAL RECORD NO.:  [id number]          PATIENT TYPE:  INP   LOCATION:  5034                         FACILITY:  [facility]   PHYSICIAN:  Lenard Galloway. Mortenson, M.D.DATE OF BIRTH:  11/21/1950   DATE OF ADMISSION:  04/07/2004  DATE OF DISCHARGE:  04/11/2004                                 DISCHARGE SUMMARY   ADMISSION DIAGNOSES:  1.  End-stage osteoarthritis right knee.  2.  Obesity.   DISCHARGE DIAGNOSES:  1.  Right total knee arthroplasty.  2.  Asymptomatic postoperative blood loss anemia.  3.  Obesity.   HISTORY OF PRESENT ILLNESS:  The patient is a 60 year old white male with a  many-year history of progressively worsening pain and difficulty with range  of motion of his right knee.  He does have a history of a left total knee  arthroplasty.  The pain is with any weightbearing, and the amount of time on  his feet will worsen his discomfort.  He does have difficulty in stiffness  with range of motion.  The pain is a constant dull ache, with occasional  sharp shooting pains.  He does have mechanical symptoms such as grinding and  popping.  He does have significant night pain.  X-rays reveal end-stage  osteoarthritis with bone on bone.   ALLERGIES:  No known drug allergies.   CURRENT MEDICATIONS:  Motrin 800 mg p.o. b.i.d.   SURGICAL PROCEDURE:  On April 07, 2004, the patient was taken to the O.R.  by Rinaldo Ratel, M.D., assisted by Norlene Campbell, M.D., and Arnoldo Morale, P.A.-C.  Under general anesthesia, the patient had a right total knee  arthroplasty, computer assisted.  The patient had no complications.  One  Hemovac drain was left in place.  The following implants were used:  A size  large right femoral component, a size 5 keeled tibial tray, large patella  component, and a size large 10 mm polyethylene bearing components were  implanted with polymethylmethacrylate.   CONSULTS:  The following consults were  obtained:  1.  Physical therapy.  2.  Occupational therapy.  3.  Case management.   HOSPITAL COURSE:  On April 07, 2004, the patient was admitted to [facility] under the care of Rinaldo Ratel, M.D.  The patient had a  right total knee arthroplasty with computer-aided assistance without any  complications.  The patient was transferred to the recovery room and then to  the orthopedic floor for routine total knee protocol, with his leg in the  CPM.  IV antibiotics and pain medicines and Arixtra for DVT prophylaxis.   The patient then incurred a total of four days of postoperative care on the  floor, in which the patient did develop some postoperative blood loss  anemia, with a hemoglobin dropping to 9.6, but his vital signs remained  stable.  The patient tolerated it well with physical therapy with no light-  headedness or dizziness.  No transfusion was required.  The patient's pain  was fairly well controlled with transitioning from IV to p.o. medications.  The  patient's wound remained benign for any signs of infection.  His leg  remained neuromotor-vascularly intact.  The patient worked well with  physical therapy, so on postoperative day four arrangements were made and he  was discharged to home with outpatient physical therapy arrangements.   LABORATORIES:  CBC on April 10, 2004:  WBC 6.2, hemoglobin 9.6, hematocrit  28.1, platelets 206.  Routine chemistries on April 09, 2004:  Sodium 136,  potassium 3.5.  Glucose 111, felt to be postoperative elevations due to  surgery and inactivity.  Glucose on admission was 89.  BUN 8, creatinine  0.8.  A routine urinalysis on admission was normal.   MEDICATIONS ON DISCHARGE FROM THE ORTHOPEDICS FLOOR:  1.  Colace 100 mg p.o. b.i.d.  2.  Trinsicon 1 tablet p.o. t.i.d.  3.  Laxative and enema of choice p.r.n.  4.  Percocet 1 or 2 tablets q.4-6 h. p.r.n.  5.  Tylenol 650 mg p.o. q.4 h. p.r.n.  6.  Robaxin 1,000 mg p.o. q.6 h.  p.r.n.  7.  Restoril 15-30 mg p.o. at bedtime p.r.n.  8.  Reglan 10 mg p.o. q.8 h. p.r.n.  9.  Arixtra 2.5 mg subcutaneously daily for a total of seven days.   DISCHARGE INSTRUCTIONS:  1.  Patient to continue routine home medications, with the addition of      Percocet 5 mg 1 or 2 tablets q.4-6 h. for pain if needed.  2.  Arixtra 2.5 mg injection daily until gone, then is to take an aspirin      once a day for 30 days.   ACTIVITY:  As tolerated with the use of a walker.   DIET:  No restrictions.   WOUND CARE:  Incision is to be kept clean and covered.  If any problems with  infection or questions, the patient is to call Dr. Andreas Blower office for  advice.   FOLLOWUP:  The patient should call [phone] for a follow-up appointment  later this week with Dr. Chaney Malling.   CONDITION UPON DISCHARGE TO HOME:  Improved and good.      RWK/MEDQ  D:  06/25/2004  T:  06/25/2004  Job:  [id number]

## 2010-06-26 NOTE — H&P (Signed)
[name], [name]                 ACCOUNT NO.:  [id number]   MEDICAL RECORD NO.:  [id number]          PATIENT TYPE:  INP   LOCATION:                               FACILITY:  [facility]   PHYSICIAN:  Rodney A. Mortenson, M.D.DATE OF BIRTH:  February 18, 1950   DATE OF ADMISSION:  04/07/2004  DATE OF DISCHARGE:                                HISTORY & PHYSICAL   CHIEF COMPLAINT:  Right knee pain.   HISTORY OF PRESENT ILLNESS:  The patient is a 60 year old white male with  several years history of progressively worsening pain and difficulty with  range of motion of his right knee.  The patient does have a history of a  left total knee arthroplasty.  The pain in his right knee is a constant  dull, aching sensation which worsens with any attempts at range of motion,  long term standing on his feet.  He has occasional sharp shooting pains  throughout the knee.  He has no significant radiation of the pain up or down  the leg, stays mostly within the knee.  He does have significant mechanical  symptoms like popping and grinding.  He does have some swelling and he does  have significant night pain.  Due to his difficulty with range of motion and  pain in his right knee, he has started to note some soreness in his left  total knee arthroplasty.  X-rays of his right knee reveals end-stage  osteoarthritis with bone on bone and subluxation of the femur over the  tibia.   ALLERGIES:  No known drug allergies.   CURRENT MEDICATIONS:  Motrin 800 mg p.o. daily and occasionally b.i.d.   PAST MEDICAL HISTORY:  1.  Occasional heartburn.  2.  History of eczema.  3.  History of obesity.  4.  History of colon polyps.   PAST SURGICAL HISTORY:  1.  Tonsillectomy.  2.  Colon polypectomy.  3.  Arthroscopy left and right knees.  4.  High tibial osteotomy left knee.  5.  Left total knee arthroplasty in 2002.  6.  Revision of left total knee arthroplasty in 2005.  Patient denies any complications with the above  mentioned surgical  procedures.   SOCIAL HISTORY:  The patient is a healthy-appearing, slightly heavy-set 36-  year-old white male.  Denies any tobacco use, has an occasional alcoholic  beverage.  He is single.  Primary care physician is Dr. Everlene Other at [phone]  at Urgent Care.  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   FAMILY HISTORY:  Mother is alive at 60 years of age with a history of  stroke.  Father is deceased from an MI at 40.  Patient has one brother with  unknown medical history.  One sister at the age of 49, one sister at the age  of 72 with history of breast cancer and post chemotherapy medical issues.  One sister deceased from liver cancer and one sister deceased from  pancreatic cancer.   REVIEW OF SYMPTOMS:  Positive for recent  upper respiratory cold with no long-  term sequelae.  He does use glasses for reading.  He has occasional issues  with reflux.  Otherwise all other review of systems, categories and  complaints are noncontributory if not indicated above.   PHYSICAL EXAMINATION:  GENERAL APPEARANCE:  This is a healthy-appearing,  centrally obese white male who ambulates with a significant right-sided  limp, takes a little effort getting in and out of the chair and on and off  the exam table due to difficulty with his right knee.  VITAL SIGNS:  Height is 5 feet 10 inches, weight is 320 pounds.  Blood  pressure 132/88, pulse 76 and regular, respiratory rate 12.  The patient is  afebrile.  HEENT:  Head was normocephalic.  Pupils are equal, round and reactive.  Extraocular movements intact.  Sclera was not icteric.  External ears  without deformities.  Gross hearing was intact.  Oral buccal mucosa was pink  and moist.  NECK:  Supple, no palpable lymphadenopathy.  Thyroid region was nontender.  He had good range of motion of his cervical spine without difficulty or  tenderness.  CHEST:  Lungs sounds were  clear and equal bilaterally.  No wheezing, rhonchi  or rales.  CARDIOVASCULAR:  Regular rate and rhythm with no murmurs, rubs, or gallops.  ABDOMEN:  Round, obese, normal active bowel sounds, nontender to deep  palpation.  CVA region was nontender.  EXTREMITIES:  Upper extremities were symmetrically sized and shaped.  He had  good range of motion of his shoulders, elbows and wrists.  Motor strength  was 5/5.  Lower extremities:  Right and left hip had full extension, flexion  up to 120 degrees with 20 degrees internal and external rotation without any  difficulty or tenderness.  Right knee was without any signs of erythema.  He  was tender along the medial and lateral joint line.  He had very course  crepitus on the patella with full extension and flexion back to about 95  degrees.  He had about 5 to 10 degrees valgus varus laxity with stressing  and calf was soft and nontender.  Left knee had a well healed midline  surgical incision, no instability, full extension, flexion back to 100  degrees.  Calf was nontender.  The ankles were symmetrical with good dorsi  and plantar flexion.  PERIPHERAL VASCULAR:  Carotid pulses were 2+, no bruits.  Radial pulses 2+.  Unable to palpate dorsalis pedis or posterior tibial pulses due to lower  extremity edema.  He did have significant pigmentation changes in the lower  extremities but they were warm and blanched briskly.  NEUROLOGIC:  The patient was conscious, alert and appropriate.  Easy  conversation with examiner.  Cranial nerves II-XII grossly intact.  The  patient had no gross neurologic defects noted.  BREAST, RECTAL AND GU:  Examinations were deferred at this time.   IMPRESSION:  1.  End-stage osteoarthritis right knee.  2.  Left total knee arthroplasty.  3.  Central obesity.  4.  History of eczema.  5.  History of occasional reflux.  PLAN:  Patient will undergo all routine labs and tests prior to having a  right total knee arthroplasty  by Dr. Chaney Malling at [facility] on April 07, 2004.      ________________________________________  Jamelle Rushing, P.A.  ___________________________________________  Lenard Galloway Chaney Malling, M.D.   RWK/MEDQ  D:  03/31/2004  T:  03/31/2004  Job:  [id number]

## 2010-06-26 NOTE — Discharge Summary (Signed)
NAMEJYREN, CERASOLI                 ACCOUNT NO.:  [id number]   MEDICAL RECORD NO.:  [id number]          PATIENT TYPE:  INP   LOCATION:  5024                         FACILITY:  [facility]   PHYSICIAN:  Rodney A. 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   DISCHARGE DIAGNOSES:  1. 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  2. Acute blood loss anemia.  3. Hyposmolarity.  4. Tachycardia.  5. Morbid obesity.   PROCEDURE:  Revision left total knee replacement.   HISTORY:  This is a 60 year old white male with a history of multiple  surgeries on his left knee from knee arthroscopy to high tibial osteotomy to  total knee replacement between July of 1999 and July of 2002.  He had  patella revision of the left knee in October of 2005 and an infection was  I&D in April of 2006.  He has been treated with chronic oral antibiotics for  one year.  He has been off the antibiotics for 3 to 3-1/2 months.  He had  developed some erythema and a culture was positive for infection on the 21st  of August.  The knee was removed and a Zytec antibiotic spacer was placed.  He was treated with 4 weeks of IV vancomycin and oral doxycycline.  He has  had no complaints of pain or fever.  It is now felt safe to stage a revision  total knee arthroplasty.   HOSPITAL COURSE:  [age]-year-old male admitted November 18, 2005.  After  appropriate laboratory studies were obtained, he was taken to the operating  room where he underwent a revision left total knee replacement.  He  tolerated the procedure well.  He was placed on vancomycin 1 gram IV q.12  hours for two more doses.  PCA pump with Dilaudid was used for  postop pain  management in a full dose.  Foley was placed intraoperatively.  Patient was  begun on Arixtra 2.5 mg subcu daily for 7 days, beginning at 8 a.m. the  following day.  OxyContin ER 10 mg p.o. q.12 hours was also started for pain  management.  Foot pumps were used.  No CPM was used.  He was allowed out of  bed the following day and he was also placed on 1500 mg of vancomycin q.12  hours.  Contact precautions were made.  He did have some difficulties with  chest pain.  An EKG was ordered on the 12th of October.  Infectious disease  physician, Dr. Leanord Hawking, discontinued his contact precautions.  He is going to  continue the IV vancomycin and then discontinue this later.  Initially, the  Keflex was ordered at 500 mg p.o. b.i.d. and his rifampin and vancomycin was  discontinued on the 14th of October.  He was  transfused 1 unit of blood on  the 14th of October.  He was placed back on the vancomycin and rifampin on  the 14th of October by Dr. Carola Frost.  On the 15th of October, his PCA was  discontinued.  Arixtra injections were taught.  He was not allowed to have  any more than 10 degrees of knee flexion.  He must wear the knee immobilizer  as instructed.  Okay for straight leg raising in quad sets.  On the 16th of  October, Dr. Orvan Falconer discontinued the vancomycin.  He was placed on  amoxicillin 500 mg p.o. t.i.d.  He was then discharged on the 17th of  October, to return back to the office in 2 weeks postop.  EKGs of November 19, 2005 revealed sinus tachycardia with minimal voltage criteria for LVH,  which may be a normal variant.  X-ray of November 18, 2005 was read as a left  knee total arthroplasty with antibiotic spacer placed.  This was corrected  on the report by Dr. Chaney Malling because this was not an antibiotic spacer,  but was a Ambulance person.   LABORATORY STUDIES:  On admit, hemoglobin 12.5, hematocrit 36.4%, white  count 4700, platelets 327,000.  Discharge hemoglobin 8.4, hematocrit  24.5%,  white count 3100, platelets 250,000.  Sed rate on October 8 was 28.  Chemistries:  Sodium 140, potassium 4.4, chloride 106, CO2 28, glucose 87,  BUN 14, creatinine 0.8, calcium 9.7, total protein 6.7, albumin 3.7, AST 18,  ALT 15, ALP 92 and total bilirubin 0.6.  Discharge sodium 141, potassium  3.5, chloride 109, CO2 26, glucose 100, BUN 4, creatinine 0.8, calcium 8.4.  C-reactive protein was 1.2.  Urinalysis was benign for voided urine.  Blood  type was O positive.  Antibody screen negative.  Received one unit of packed  cells.  Cultures from the knee intraoperatively revealed no growth x2 days  and no anaerobic were noted.   DISCHARGE INSTRUCTIONS:  There was no restrictions in his diet.  No driving  or lifting for 6 weeks.  Crutches and weight bearing as tolerating.  No CPM.  Change his dressing daily.  Prescriptions for Percocet 5/325 one to two tabs  every 4 hours as needed for pain, OxyContin 10 mg one tab every 12 hours,  Arixtra 2.5 mg inject as instructed at a.m. last dose will be October 19.  Start his aspirin at 325 mg daily on November 27, 2005.  Amoxicillin 500 mg  one tablet 3 times a day.  rifampin 600 mg one tablet daily.  Follow the  blue instruction sheet.  [place] for his home health needs.  He will follow  back up with Dr. Chaney Malling in 2 weeks postop.  Discharged in improved  condition.      Oris Drone Petrarca, P.A.-C.    ______________________________  Lenard Galloway Chaney Malling, M.D.    BDP/MEDQ  D:  12/10/2005  T:  12/11/2005  Job:  [id number]

## 2010-06-26 NOTE — Op Note (Signed)
[name], [name]                 ACCOUNT NO.:  [id number]   MEDICAL RECORD NO.:  [id number]          PATIENT TYPE:  INP   LOCATION:  5004                         FACILITY:  [facility]   PHYSICIAN:  Rodney A. Mortenson, M.D.DATE OF BIRTH:  1951-02-03   DATE OF PROCEDURE:  09/28/2005  DATE OF DISCHARGE:                                 OPERATIVE REPORT   INDICATIONS FOR PROCEDURE:  This is a 60 year old male who has had numerous  procedures on the left knee.  His most recent operation was removal of all  polys, synovectomy with irrigation in the knee.  He has had the knee  aspirated on multiple occasions.  All cultures have been negative throughout  his entire clinical course.  He has been on long-term antibiotic suppression  for over a year, and this was discontinued about six months ago and just  recently over the past week or so, he started developing pain and swelling  about the knee once again.  The knee was aspirated and there was a great  deal of fluid with 44,000 polys.  Cultures to date are again negative.  He  is now admitted for removal of the total knee, with the expectation that he  has a low grade chronic infected total knee.   PREOPERATIVE DIAGNOSIS:  Infected left total knee.   POSTOPERATIVE DIAGNOSIS:  Infected left total knee.   PROCEDURE PERFORMED:  Extensive synovectomy, left knee; removal of femoral  and tibial components, left knee; removal of tibial poly; removal of all  bone glue from distal femur, proximal tibia, gentamicin temporary knee  spacer, with Palacos impregnated with tobramycin to stabilize all the  components.   SURGEON:  Lenard Galloway. Chaney Malling, M.D.   ASSISTANT SURGEON:  Chestine Spore, Santiago Bumpers.   ANESTHESIA:  General.   DESCRIPTION OF PROCEDURE:  The patient was placed on the operating table in  the supine position with a pneumatic tourniquet about the left upper thigh.  The entire left lower extremity was sterilely prepped and draped in the  usual  manner.  The tourniquet was placed about the left upper thigh.  The  leg was then wrapped out with an Esmarch and the tourniquet was elevated.  An incision was made through previous anterior incision, and carried from  above the patella down to the pubic tubercle.  Skin edges were retracted.  The sutures along the medial retinaculum were all removed.  The patella was  then everted laterally.   There was a great deal of cloudy turbid fluid in the knee which had  previously been cultured 24 hours ago.  There was a great deal of synovitis  and extensive synovectomy of the knee was done.  Parts of the synovium were  sent for both aerobic and anaerobic cultures.  The pulsating lavage was used  throughout.   At this point very careful examination of the femoral and tibial components  was done; and neither one of the components appeared to be loose.  This was  examined very carefully.  Again, there was a great deal of synovitis  throughout the knee.  The  tibia poly peg was then amputated with the  osteotome and the tibia poly removed.  The tibia poly was then sent to  pathology for special culture studies of the poly itself, as often bacteria  grow in the biologic film over the poly component.  All soft tissue adjacent  around the tibia poly was stripped off and was also sent for aerobic and  anaerobic cultures and labeled in a special tube.   At this point, a small osteotome was used to free up the glue around the  tibial component.  A great deal of time was spent loosening the tibial  component and it was finally removed.  Again, this seemed to be glued very  stably.  Great care was taken to remove all the glue in this area.  The  medullary cavity was examined very carefully and a small pituitary rongeur  was used to remove all glue.  A great deal of time was spent removing the  glue.   At this point attention was then turned to the distal femur.  The Gigli saw  was placed behind the  femoral flange, and this was brought down anteriorly  as close to the process as possible.  A small thin osteotome was then used  to loosen the glue on either side of the prosthesis on the median and  lateral sides.  Eventually the entire femoral component was removed with  loss of a minimum amount of bone.  Bone was taken from the femoral side and  from the tibial side and sent in another container labeled Bone for Aerobic  and Anaerobic Cultures.  A great deal of the time was spent removing all  glue that could be seen and removing any tissue that appeared inflamed.  Again, the pulsating lavage was used extensively.   At this time Palacos was impregnated with tobramycin and worked.  We had to  temper a knee spacer that was impregnated with gentamicin on the back table  and this was measured using the trials.  The final components were then  placed on the back table.   As the Palacos set up and the femur was covered with Palacos, the temporary  femoral knee spacer was implanted in good position.  In a similar manner,  some Palacos which had been molded into a tube was passed down the hole in  the tibia to leach out the tobramycin.  The proximal tibia was then covered  with tobramycin and a temporary tibial spacer was inserted and articulated  with the femoral component.  The edges were packed with the Palacos glue and  excess glue removed.  Good alignment was achieved and the soft tissue spaces  were preserved.  Once the glue was hardened, the knee was irrigated again  with pulsating lavage.   The deep tissues were closed with interrupted Vicryl sutures which would  dissolve.  The subcutaneous tissue was closed with Vicryl; and the skin was  closed with nylon sutures with stainless steel staples.  Sterile dressings  were applied, and a Hemovac was placed into the wound.  The patient then returned to the recovery room in excellent condition.  In summary, this  procedure went extremely  well.  This was a very complicated, prolonged,  extensive procedure.           ______________________________  Lenard Galloway. Chaney Malling, M.D.     RAM/MEDQ  D:  09/30/2005  T:  10/01/2005  Job:  [id number]

## 2010-06-26 NOTE — Op Note (Signed)
NAMEJAHKARI, [name]                 ACCOUNT NO.:  [id number]   MEDICAL RECORD NO.:  [id number]          PATIENT TYPE:  INP   LOCATION:  5023                         FACILITY:  [facility]   PHYSICIAN:  Lenard Galloway. Mortenson, M.D.DATE OF BIRTH:  05-Oct-1950   DATE OF PROCEDURE:  06/02/2004  DATE OF DISCHARGE:                                 OPERATIVE REPORT   PREOPERATIVE DIAGNOSIS:  Presumptive infected left total knee with negative  cultures.   POSTOPERATIVE DIAGNOSIS:  Presumptive infected left total knee with negative  cultures.   OPERATION:  Extensive open synovectomy, left knee; poly exchange tibial  plateau; removal patella button and extensive debridement and washing out of  left knee using pulsating lavage.   SURGEON:  Dr. Chaney Malling   ASSISTANT:  Randel Pigg, PA-C   ANESTHESIA:  General.   DESCRIPTION OF PROCEDURE:  The patient placed on the operative table in a  supine position with pneumatic tourniquet about the left upper thigh.  The  entire left lower extremity was prepped with DuraPrep and draped out in the  usual manner.  A Vi-Drape was placed to the operative site.  The leg was not  wrapped out with an Esmarch.  The leg was just elevated, and the tourniquet  was blown up.  Incision in the superior part of the previous incision  carried down to the tibial tubercle.  Skin edges were retracted.  An  incision was carried down through the long knee parapatellar incision which  was made previously.  All sutures were removed.  Extensive releases were  done.  There was a great deal of synovitis in the knee, and a great deal of  the time was spent doing an extensive synovectomy of all compartments which  included exterior pouch, median lateral recess, and the posterior pouch.  This tissue was sent for pathology for Gram stain, white count, and  differential and evaluation.  The patella poly during maneuvering came  loose.  When this was put in previously, there was just a small shell  of  patella to reinsert the poly, and it was felt that revision to another poly  would be technically severely compromised.  Instead, it was elected to leave  the poly out, and the edges of the patella were rongeured, and it was fairly  smooth, and hopefully this will be filled with scar tissue.  The knee was  flexed 90 degrees.  An osteotome was used to cut the peg off the tibial  poly, and the tibial poly was removed.  Once this was accomplished, a self-  retaining retractor was placed in the wound, and this held the tibia apart  from the femur, and an extensive synovectomy of the posterior recess is also  done.  A great deal of time was spent removing all the synovium.  Once this  was accomplished, the pulsating lavage was used.  A complete lavage of the  entire knee was accomplished.  At this point, the tourniquet was dropped,  and bleeders were coagulated.  Bleeding was controlled fairly nicely.  A  trial poly of 15 and 17  mm was used, and the 17.5 mm fit much nicer and gave  good stability.  The final 17.5 large poly was then inserted in the tibia.  The knee was put through a full range of motion.  There was good stability.  The patella tracked nicely in the midline.  Hemovac drain was then placed in  the wound.  The long median parapatellar incision was then closed with heavy  Vicryl sutures.  Vicryl was used also to close the subcutaneous tissue, and  stainless steel staples were used to close  the skin.  Hemovac was hooked to suction.  A large bulky pressure dressing  was applied, and the patient then returned to the recovery room in excellent  condition.  Technically, this procedure went extremely well.  Complications  none.  Drains Hemovac.      RAM/MEDQ  D:  06/02/2004  T:  06/02/2004  Job:  [id number]

## 2010-06-26 NOTE — Discharge Summary (Signed)
[name], [name]                 ACCOUNT NO.:  [id number]   MEDICAL RECORD NO.:  [id number]          PATIENT TYPE:  INP   LOCATION:  5004                         FACILITY:  [facility]   PHYSICIAN:  Rodney A. Mortenson, M.D.DATE OF BIRTH:  1950-07-13   DATE OF ADMISSION:  09/28/2005  DATE OF DISCHARGE:  10/06/2005                                 DISCHARGE SUMMARY   ADMISSION DIAGNOSES:  1. Probable infected left total knee arthroplasty.  2. History of polyps.   DISCHARGE DIAGNOSES:  1. Infected left total knee arthroplasty status post excisional      arthroplasty and treatment with IV antibiotics.  2. Acute blood loss anemia secondary to surgery.  3. Urinary retention, now resolved.   SURGICAL PROCEDURES:  On September 30, 2005, [name] underwent an excisional  arthroplasty of his infected left total knee with synovectomy, removal of  bone glue and placement of temporary antibiotic knee spacer.   ASSISTANT:  Rexene Edison, PA-C and Oris Drone. Petrarca, PA-C.   COMPLICATIONS:  None.   CONSULTANT:  1. Infectious disease consult by Dr. Lenn Sink, September 28, 2005.  2. Case management consult, September 28, 2005.  3. Physical therapy consult and home health care consult by [facility] on October 01, 2005 in addition to a pharmacy consult for      vancomycin therapy.   HISTORY OF PRESENT ILLNESS:  This 60 year old white male patient presented  to Dr. Chaney Malling with a history of a left knee replacement done by him in  2002.  He had had a patellar revision with polyethylene exchange in October  2005 and then an open synovectomy of the left knee in April 2006 with a  polyethylene exchange.  He had done well but had been treated with oral  antibiotics for quite a long period of time.  He went about 3-4 months  without problems and then starting complaining of increased swelling and  pain in the knee.  He is seen in the office on August 10 with a 6-8 week  history of feeling  of instability and swelling in the knee.  No swelling  initially but now it had developed with severe pain.  The knee was aspirated  of cloudy yellow fluid and it appeared grossly infected so with that he was  admitted for antibiotics and possible removal of the prosthesis.   HOSPITAL COURSE:  [name] was admitted and infectious disease consult was  obtained.  They recommended monitoring the cultures.  A bone scan was  obtained and he was monitored.  The bone scan did show increased activity in  the left knee, possible infection versus aseptic loosening and slight  increased uptake in the right knee due to arthritis.  Plans were made for  excisional arthroplasty the next day on the 23rd and he was n.p.o.   On the 23rd he tolerated the surgical procedure well without immediate  postoperative complications.  He was transferred to 5000.  Multiple cultures  were taken from the knee.  Postop day #1, he was having difficulty voiding  and a Foley was placed.  This was maintained for 2 days to help with the  retention.  Hemoglobin was 11.8, hematocrit 34, white count 7.  Bone culture  had showed rare gram positive cocci in pairs with white blood cells but  other specimens were negative.  We awaited the culture and sensitivity.  He  was started on therapy.  He was also started on vancomycin due to gram  positive cocci found.   He continued to make slow progress over the next several days.  He did get  better with ambulation.  Hemoglobin was 11.9, hematocrit 34.2 on the 25th.  He was continued on therapy.  His Foley was discontinued actually on the  26th.  He tolerated that well. The fevers did appear to be getting better.  Still there was no growth from the cultures.  Plans were made for possible  PICC line placement.  He did have multiple questions of the infectious  disease doctor, so the PICC line was not placed until August 28.  At that  point, he had been improving with this therapy and it  is felt even though  there was no growth on the cultures that vancomycin would be the treatment  of choice.  Dr. Maurice March has recommended 3 more weeks of IV vancomycin 1500 mg  IV q.12h. and then 2 weeks of doxycycline after that.  A PICC line was placed on the 28th and now on the 29th he is ready for DC  home.   DISCHARGE INSTRUCTIONS:   DIET:  He can resume his regular prehospitalization diet.   MEDICATIONS:  He may resume his prehospitalization meds which the only thing  he was taking was Motrin as needed.  He can use as this as needed for pain.   ACTIVITY:  He can be out of bed partial weightbearing on the left leg with  the use of the walker.  He is to have home CPM 0 to 30 degrees a day only.  Home health PT to help him with ambulation.   WOUND CARE:  He is to keep the left knee incision clean and dry and clean it  with Betadine once a day.  He is to notify Dr. Chaney Malling of temperature  greater than or equal to 101.5 degrees Fahrenheit, chills, pain unrelieved  by pain meds or foul smelling drainage from the wound.   FOLLOW-UP:  He is to follow up with Dr. Chaney Malling in 1 week and needs to  call [phone] for that appointment.   ADDITIONAL MEDICATIONS:  1. Arixtra 2.5 mg subcu at 12 noon with the last dose to be August 30.  On      the 31st, she is to start 1 baby aspirin a day for 1 month.  2. OxyContin 10 mg 1 tablet p.o. q.12h. do not crush or chew, 22 with no      refill.  3. Percocet 5/325 mg 1-2 tablets p.o. q.4h. p.r.n. for pain, 60 with no      refill.  4. Robaxin 500 mg 1-2 tablets p.o. q.6h. p.r.n. for spasms, 40 with no      refill.  5. Vancomycin 1500 mg IV q.12h. for 21 days and then once that is      completed, he is to start doxycycline 100 mg p.o. b.i.d. for 2 weeks      and then he will be ready for the revision arthroplasty at that time.   FOLLOW-UP:  He has arranged for home health  therapy RN and aides per [facility].   LABORATORY DATA:   Chest x-ray done on August 21 showed no active  cardiopulmonary disease.  Bone scan done on August 22 showed increased  radiotracer accumulation in the left knee in all three phases. Differential  includes aseptic loosening versus infection.  A tagged white blood cell scan  and bone marrow mapping would be the most specific exam to rule out  infection.  There is also increased uptake within the right knee joint which  was nonspecific.  Chest x-ray done on August 28 showed the right PICC line  coursing superiorly into the right internal jugular vein.  The tip is not  visualized.  They recommended pulling it back 10 cm and readvancing.  There  was low lung volumes and no acute findings.   White count throughout the admission has been within normal limits.  It  ranged from 5.5 on the 21st to a high of 7 on the 24th to 4.6 on the 29th.  Hemoglobin and hematocrit have ranged from 12.5 and 36.2 on the 21st to 11  and 31.6 on the 26th to 9.9 and 28.6 on the 29th.  Platelets have been  within normal limits.   Glucose ranged from 99 on the 21st to a high of 131 on the 24th.   Tissue from the left knee adjacent to the polyethylene on August 23 showed  rare gram positive cocci in pairs with moderate white blood cells but no  growth.  Tissue culture from the bone on the left knee also showed no white  blood cells but rare gram positive cocci in pairs, no growth.  All other  laboratory studies were within normal limits.      Legrand Pitts Duffy, P.A.    ______________________________  Lenard Galloway Chaney Malling, M.D.    KED/MEDQ  D:  10/06/2005  T:  10/07/2005  Job:  [id number]

## 2010-06-26 NOTE — Cardiovascular Report (Signed)
[place]. [facility]  Patient:    FLEM, ENDERLE                        MRN: [id number] Proc. Date: 04/29/00 Adm. Date:  [id number] Disc. Date: [id number] Attending:  Berry, Jonathan [place] CC:         Tracey Harries, M.D., Urgent Care, [place], [place]  [facility], [address] [place], [place] [address]                        Cardiac Catheterization  PROCEDURE:  Cardiac catheterization.  CARDIOLOGIST:  Runell Gess, M.D.  INDICATIONS:  Mr. Neil Crouch is a 60 year old moderately overweight single white male with positive cardiac risk factors and recent history of congestive heart failure and angina.  He was seen in the office yesterday and presents now for diagnostic coronary arteriography.  DESCRIPTION OF PROCEDURE:  The patient was brought to the second floor [facility] in the postabsorptive state.  He was given p.o. Valium.  His right groin was prepped and shaved in the usual sterile fashion.  Then 1% Xylocaine was used for local anesthesia.  A 6 French sheath was inserted into the right femoral artery using standard Seldinger technique. The 6 French left and right diagnostic Judkins catheters along with the 6 French pigtail catheters were used for selective coronary angiography and left ventriculography, respectively.  Omnipaque dye was used for the entirety of the case.  Retrograde, aortic, left ventricular and pullback pressures were recorded.  HEMODYNAMICS: 1. Aortic systolic pressure 109, diastolic pressure 74. 2. Left ventricular systolic pressure 109, diastolic pressure 14.  SELECTIVE CORONARY ANGIOGRAPHY: 1. Left main:  Normal. 2. Left anterior descending:  The LAD is normal. 3. Left circumflex:  Normal. 4. Optional diagonal branch normal. 5. Right coronary artery was dominant and normal.  LEFT VENTRICULOGRAPHY:  The RAO left ventriculogram was performed using 20 cc of Omnipaque dye  at 12 cc per second.  The overall LV ejection fraction was greater than 60% with no focal wall motion abnormalities.  OVERALL IMPRESSION:  Mr. Neil Crouch has normal coronary arteries and normal left ventricular function.  I believe his chest pain is noncardiac and is likely reflux related.  The sheaths were removed.  Pressure was held to the groin to achieve hemostasis.  The patient left the lab in stable condition.  He will be discharged home this evening.  I will see him back in the office in approximately two weeks for follow-up.  Dr. Tracey Harries is notified of these results. DD:  04/29/00 TD:  05/01/00 Job: [id number]

## 2010-06-26 NOTE — Discharge Summary (Signed)
[name], [name]                 ACCOUNT NO.:  [id number]   MEDICAL RECORD NO.:  [id number]          [name] TYPE:  INP   LOCATION:  5030                         FACILITY:  [facility]   PHYSICIAN:  Lenard Galloway. Mortenson, M.D.DATE OF BIRTH:  1950-05-05   DATE OF ADMISSION:  11/19/2003  DATE OF DISCHARGE:  11/22/2003                                 DISCHARGE SUMMARY   ADMITTING DIAGNOSES:  1.  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  2.  Osteoarthritis right knee.  3.  Mild gastroesophageal reflux disease.  4.  Eczema.  5.  History of colon polyps with negative colonoscopy.   DISCHARGE DIAGNOSES:  1.  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  2.  Acute blood loss anemia secondary to surgery.  3.  Constipation.  4.  Osteoarthritis right knee.  5.  Gastroesophageal reflux disease, mild.  6.  Eczema.  7.  History of colon polyps with recent negative colonoscopy.   SURGICAL PROCEDURE:  On November 19, 2003 [name] underwent a revision of  his loose patellar component of his left knee and polyethylene exchange of  the tibial component by Dr. Rinaldo Ratel assisted by Dr. Claude Manges.  [employer], P.A.-C.   COMPLICATIONS:  None.   CONSULTS:  1.  Case management, physical therapy consult November 20, 2003  2.  Occupational therapy consult November 21, 2003   HISTORY OF PRESENT ILLNESS:  Garrett 60 year old white male [name] presented  to Dr. Chaney Malling with a history of a left knee replacement by him in 2002.  It did well until nine months ago when he started having some pain and  clicking in that knee.  At Garrett time it feels like the kneecap gets caught  in the wrong position and that it cannot straighten.  There is a sharp  object poking him.  X-rays show a slight liftoff of the patellar component.  Because of Garrett and his complaints of  pain, he is presenting for a revision  of his left knee replacement.   HOSPITAL COURSE:  [name] tolerated his surgical procedure well without  immediate postoperative complications.  He was transferred to 5000.  Postoperative day one Tmax was 100.  Vitals were stable.  Pain was  controlled with medications.  Leg was neurovascularly intact.  He was  started on therapy per protocol.   He did well over the next several days.  He had only some difficulty with  constipation which was treated effectively with a laxative.  He was able to  progress well with therapy so that on November 22, 2003 he was ready to go  home.  His Tmax at that time was 98 degrees.   DISCHARGE INSTRUCTIONS:   DIET:  He can resume his regular pre hospitalization diet.   MEDICATIONS:  1.  He may resume his pre hospitalization medications except no ibuprofen      while on the Lovenox.  Home medications included  Motrin 800 mg p.o.      q.a.m. and a multivitamin one tablet p.o. q.a.m.  2.  Additional medications at Garrett time include Lovenox 40 mg subcutaneous      once a day, last dose December 03, 2003.  3.  Percocet 5/325 mg one to two tablets p.o. q.4-6h. p.r.n. for pain.   ACTIVITY:  He can be out of bed weightbearing as tolerated on the left leg  with use of a walker.  He did have home CPM.  Please see the blue total knee  discharge sheet for further activity instructions.   WOUND CARE:  He is to keep the left knee incision clean and dry.  May shower  after no drainage from the wound for two days.  Please see the blue total  knee discharge sheet for further wound care instructions.   FOLLOWUP:  He needs to follow up with Dr. Chaney Malling in approximately 10-12  days after surgery.  He needs to call [phone] for that appointment.   LABORATORY DATA:  On October 5 hemoglobin is 13.2, hematocrit 38.6.  On the  13 hemoglobin 10.8, hematocrit 31.3.  October 14 white count 5, hemoglobin  11.5, hematocrit 32.8,  platelets 192.   On October 12 sodium 133, potassium 3.7, glucose 117, calcium 8.3.  On  October 13 calcium 8.2.  All other laboratory studies were within normal  limits.  Wound culture taken anaerobics on the left knee on October 11  showed rare white blood cells, both PMNs and monos, but no epithelial, no  organisms, and no anaerobes isolated.       KED/MEDQ  D:  12/16/2003  T:  12/16/2003  Job:  [id number]

## 2010-06-26 NOTE — Op Note (Signed)
NAMEGAINES, CARTMELL                 ACCOUNT NO.:  [id number]   MEDICAL RECORD NO.:  [id number]          PATIENT TYPE:  AMB   LOCATION:  DSC                          FACILITY:  [facility]   PHYSICIAN:  Rodney A. Mortenson, M.D.DATE OF BIRTH:  Jul 30, 1950   DATE OF PROCEDURE:  01/20/2006  DATE OF DISCHARGE:                               OPERATIVE REPORT   JUSTIFICATION:  A 60 year old male has had multiple operative procedures  on the left knee.  The most recent was a reconstruction revision total  knee on the for infection.  He now has a stiff left knee and comes in  now for closed manipulations.  Complications discussed preoperatively.  Questions answered and encouraged extensively.   PREOPERATIVE DIAGNOSIS:  Arthrofibrosis, left knee.   POSTOPERATIVE DIAGNOSIS:  Arthrofibrosis, left knee.   OPERATION:  Closed manipulation of left knee.   ANESTHESIA:  General.   SURGEON:  Rodney A. Chaney Malling, M.D.   PROCEDURE:  The patient was placed on the operative table in the supine  position.  After satisfactory general anesthesia, the knee was  manipulated.  Pre-manipulation, the knee flexed to about 45-50 degrees.  After manipulation, the knee easily to 90 degrees.  No instability after  manipulation.  No fluid in the knee.  No complications.   DISPOSITION:  1. Return to my office in 1 week.  2. Start intensive physical therapy on daily basis.  3. Percocet for pain.           ______________________________  Lenard Galloway. Chaney Malling, M.D.     RAM/MEDQ  D:  01/20/2006  T:  01/20/2006  Job:  [id number]

## 2010-06-26 NOTE — Op Note (Signed)
NAMETRENTON, PASSOW                 ACCOUNT NO.:  [id number]   MEDICAL RECORD NO.:  [id number]          PATIENT TYPE:  INP   LOCATION:  5024                         FACILITY:  [facility]   PHYSICIAN:  Rodney A. 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   OPERATION:  Complex revision of left total knee using the following  components: PCS Sigma Knee system, 5 mm, posterior augmentation combo with a  distal augmentation 8-mm left distal femur, a Sigma femoral adapter, 5  degrees, PFC Modular Knee System, cemented, tibial stem, 13 mm x 30 mm,  revision cemented NBT size 4 tibial tray with a PFC Modular cemented 13 x 60-  mm stem in the femur, with a PFC Sigma 73 x 69-mm nonporous TC3 femoral  component, size 5, cement restrictor, size 5, in the femur, and a size 3  cement restrictor in the femur, bone cement with tobramycin and a TC3  polyethylene size 5 insert with a 10 x 2-mm bearing, and a distal 12-mm left  augmentation on the distal femur.   SURGEON:  Lenard Galloway. Chaney Malling, M.D.   ASSISTANTS:  Claude Manges. Cleophas Dunker, M.D.  [employer], P.A.-C.   NOTE:  This was a very complex total knee revision.   ANESTHESIA:  General.   PROCEDURE:  The patient was placed on the operating table in supine position  with a pneumatic tourniquet about the left upper thigh.  The entire left  lower extremity was prepped with DuraPrep and draped out in the usual  manner.  Vi-Drape was placed over the operative site.  The leg was then  wrapped out with an Esmarch.  The tourniquet was elevated.  A long incision  was made over the anterior aspect of the knee in the area of the previous  incisions.  There was a great deal of edema in the skin and  scarring, with  multiple levels of scarring and adhesions.  These were released.  A median  parapatellar incision was made and the patella was everted.  The patella had  previously been partially removed, and only a shell of the patella was  present.  The capsule was opened.  There was a great deal of scarring about  the knee.  A great deal of time was spent with lysis of adhesions and doing  a synovectomy.  Cultures were taken of the soft tissues in the suprapatellar  pouch.  A great deal of time was spent exposing the distal femur and  proximal tibia.  Once this was accomplished to my satisfaction, the spacers  that were inserted were removed.  These were polymethylmethacrylate spacers  with tobramycin-impregnated cement.  The spacers were all removed.  This  gave excellent access to the knee.  All loose tissues were removed.  There  was some granulation-type tissue in the  proximal tibia, and this was also  removed and sent for cultures.  An IM rod was placed down the femur, and the  first tibial cutting jig was placed on the IM rod.  This was brought down to  the level of the tibia, and about 3 to 4 mm of proximal tibia was resected  using the guide for a clean-up cut on the proximal tibia.  Once this was  done, spacer blocks were put in place for balancing to approximate the foot  size of the space that would be available.  Once this was done to my  satisfaction, all loose bone was removed from the proximal tibia.  Attention  was turned to the distal femur.  The distal femur had an IM rod inserted,  put in by hand, and the distal femoral jig was placed over the distal end of  the femur.  The distal femoral condyles were amputated.  This gave a nice  smooth surface.  It was determined that 4-mm medial and 8-mm lateral spacers  were needed for the femoral component.  The size 5 femur seemed to fit quite  nicely.  Initially, A/P of the distal femur was cut, as noted above, and  then with a  +2 bolt in position, the A/P cut was made.  Once this was  accomplished, the TC3 notch cut was then made.  The distal chamfer guide, or  femoral guide #3, was placed over the distal end of the femur and chamfer  cuts were made.  It was determined we needed two 4-mm posterior augments and  an 8-mm distal medial augment and a 12-mm lateral distal augment.  A great  deal of time was spent preparing the bone on the proximal tibia and the  femur.  Once this was accomplished to my satisfaction, a trial 22.5-mm RP  poly insert for the TC3 was put in place, and this balanced the medial  collateral ligaments both in flexion and extension very nicely.  The patient  had the patella previously removed and a new patella was not necessary.  There was a shell of patella still present, which articulated with the  anterior aspect of the knee.  The knee was put through a full range of  motion.  The knee was stable in flexion and extension.  Again, a great deal  of time was spent inserting each of the components and balancing the  ligaments, the soft tissues and the leg lengths.  Once this was finished to  my satisfaction, the knee could be put through a full range of motion,  which, as noted above, was quite stable.  Clamps were used to close the  medial capsule, and A/P drawer was negative.  All the trial components were  then removed.  The cement restrictors were placed in the distal femur and  proximal tibia at the appropriate level.  Glue was mixed for the tibia, and  tobramycin was put in the cement and mixed.  This was then inserted in the  proximal tibia under pressure.  The tibial component which had been selected  previously was then press-fitted and excess glue was removed.  Excellent  alignment of the tibia was apparent.  A second batch of glue was mixed with  tobramycin glue and was placed in the femur under pressure and placed about  the femoral component, which was then prefit with all the  various augments that were necessary.  Once this was accomplished, glue was placed on the  posterior aspect of the femoral component and the femoral stem and injected  into the femoral canal under pressure.  The femoral component was then  inserted and excess glue was removed.  A trial poly was inserted.  The knee  was articulated and held in extension with longitudinal pressure.  Excess  glue was removed using the small-hole curets.  Once the glue had cured, the  knee was put through a full range of motion.  I was quite pleased with  extension, with flexion, and stability in flexion and extension.  The trial  components were then removed.  A pulsating lavage was used.  At this point,  the tourniquet was dropped and bleeders were coagulated.  There was a pumper  in the posterior capsule, and a hemoclip was used to control bleeding in  this area.  A fair amount of time was spent getting a much drier wound.  This was packed sequentially, and the Bovie was used.  Finally, the losing  of blood was controlled very nicely.  The knee was irrigated again with  pulsating lavage.  All debris was removed.  Once this was accomplished, the  final poly was inserted and the knee articulated once again and put through  a full range of motion.  The knee was then closed in multiple layers, as  there was extensive scarring from the multiple previous operative  procedures.  The medial capsule was reconstructed. The anterior capsule was  then closed.  The VMOs were then brought medially and slightly distally, and  our medial  parapatellar incision was closed and the absorbable sutures were  used.  The capsule was closed very nicely in an anatomic position.  The  subcu tissue was closed with absorbable sutures, and the skin was closed  with stainless steel staples.  A large bulky fresh dressing and a knee  immobilizer were applied, and the patient returned to the recovery room in  excellent condition.  I was  very pleased with the final surgical outcome and  the alignment and stability of the knee.  A Hemovac drain had been placed in  the wound prior to closure.  The patient had returned to the recovery room  doing well.           ______________________________  Lenard Galloway. Chaney Malling, M.D.     RAM/MEDQ  D:  11/18/2005  T:  11/19/2005  Job:  [id number]

## 2010-06-26 NOTE — Op Note (Signed)
NAMERENDON, HOWELL                 ACCOUNT NO.:  [id number]   MEDICAL RECORD NO.:  [id number]          PATIENT TYPE:  INP   LOCATION:  5004                         FACILITY:  [facility]   PHYSICIAN:  Rodney A. 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   Dictation ended at this point.           ______________________________  Lenard Galloway. Chaney Malling, M.D.     RAM/MEDQ  D:  09/30/2005  T:  10/01/2005  Job:  [id number]

## 2010-06-26 NOTE — H&P (Signed)
[name], MOLINE                 ACCOUNT NO.:  [id number]   MEDICAL RECORD NO.:  [id number]          PATIENT TYPE:  INP   LOCATION:  NA                           FACILITY:  [facility]   PHYSICIAN:  Rodney A. Mortenson, M.D.DATE OF BIRTH:  27-Apr-1950   DATE OF ADMISSION:  11/19/2003  DATE OF DISCHARGE:                                HISTORY & PHYSICAL   CHIEF COMPLAINT:  Left knee pain, clicking sensation.   HISTORY OF PRESENT ILLNESS:  [name] is a 60 year old white male with left  knee pain and clicking sensation.  Patient underwent left total knee  arthroplasty 2002.  The total knee replacement did well until approximately  nine months ago.  At that time he started developing pain and clicking  sensation in the knee.  Patient feels as if the kneecap gets caught in the  wrong position, feels like there is not enough room for the knee to  straighten.  Pain described as a sharp object under the skin poking him.  X-  rays of the left knee show what appears to be lift-off of the patellar  component.  Otherwise, x-rays of the left knee show good position of the  prosthesis.  The patient therefore is to undergo a left total knee  arthroplasty revision by Dr. Chaney Malling on November 19, 2003 at [facility].   ALLERGIES:  No known drug allergies.   MEDICATIONS:  1.  Motrin 800 mg one p.o. daily.  2.  Multivitamin one p.o. daily.   PAST MEDICAL HISTORY:  1.  Occasional heartburn.  2.  Eczema.  3.  Obesity.  4.  History of colon polyps.   PAST SURGICAL HISTORY:  1.  Tonsillectomy.  2.  Removal of colon polyps.  3.  Arthroscopy left knee, arthroscopy right knee.  4.  High tibial osteotomy left knee.  5.  Left total knee arthroplasty done by Dr. Chaney Malling 2002.   Patient denies any complications with any above procedures; however, he did  have urinary retention postoperatively.   SOCIAL HISTORY:  Patient denies any tobacco use.  He occasionally uses  alcohol.  He is single.   Primary care physician is Dr. Everlene Other [phone]),  [facility].  Lives in a one-story home with three steps to usual  entrance.  Employed as a Engineering geologist.   FAMILY HISTORY:  Patient's mother is alive at age 42, history of a stroke.  Father is deceased age 65, MI at age 35, unsure of cause of death.  Patient  has one brother and five sisters.  Brother is age 73 and he is unsure of his  health.  He has a sister who is age 73, history of cervical cancer.  He has  a sister who is 12 and has history of breast cancer.   REVIEW OF SYSTEMS:  Patient denies any chest pain, shortness of breath, PND,  or orthopnea.  Wears glasses for reading.  Has occasional reflux.  Has  history of colon polyps and undergoes colonoscopy every five years.  Last  colonoscopy was 2004.  Otherwise, review of  systems negative.   PHYSICAL EXAMINATION:  GENERAL:  Patient is a well-developed, well-  nourished, obese male.  Patient's mood and affect are  appropriate.  He  talks easily with examiner.  VITAL SIGNS:  Height 5 feet 10 inches, weight 320 pounds, temperature 97.3  degrees Fahrenheit, blood pressure 110/80, respiratory rate 16, pulse 66.  CARDIAC:  Regular rate and rhythm.  No murmurs, rubs, or gallops noted.  RESPIRATORY:  Lungs are clear to auscultation bilaterally with no wheezing,  rhonchi, or rales.  ABDOMEN:  Obese, nontender, soft.  Bowel sounds x4 quadrants.  HEENT:  Head is normocephalic without frontal or maxillary sinus tenderness  to palpation.  Sclerae is nonicteric, PERLA.  Conjunctivae pink.  EOMs are  intact.  No visible external ear deformities.  TMs pearly and gray.  Nose:  Nasal septum midline.  Nasal mucosa is pink and moist and without polyps.  Buccal mucosa is pink, moist.  Patient has good dentition.  Pharynx without  erythema or exudate.  Tongue, uvula midline.  NECK:  Patient has full range of motion of cervical spine.  Palpation along  the cervical spine reveals no  tenderness.  He has no lymphadenopathy.  Trachea is midline.  Carotids are 2+ without bruits.  BACK:  Nontender to palpation over the thoracic and lumbar spine.  BREASTS:  Deferred at this time.  GENITOURINARY:  Deferred at this time.  RECTAL:  Deferred at this time.  NEUROLOGIC:  Patient is alert and oriented x3.  Cranial nerves II-XII  grossly intact.  Patient has 5/5 strength in the upper extremities.  Lower  strength testing reveals 5 except for with the left leg extension is 3/5.  Deep tendon reflexes are equal and symmetric throughout in the upper and  lower extremities.  MUSCULOSKELETAL:  Patient has full range of motion of both hips without  pain.  Right knee 0-110 degrees.  Passive range of motion reveals crepitus.  Valgus and varus stressing reveal no laxity.  He does have pain with valgus  stressing.  Anterior drawer is negative.  Palpation of the joint line  reveals no tenderness.  Left knee hyperextension 2 degrees, flexed to 105  degrees.  Clicking sensation with passive range of motion.  Valgus and varus  stressing reveals no excess laxity.  Left knee is nonedematous.  There is no  effusion.  Palpation along the joint line reveals tenderness.  Patient can  fully extend the knee against gravity, however, again, decreased strength at  3/5.  Lower extremities are nonedematous.  Dorsal and pedal pulses 2+  bilaterally.   X-rays:  X-rays of the left knee show what appears to be lift-off of the  patellar component.  Otherwise, x-rays are normal and the prosthesis is in  good position, good alignment.   IMPRESSION:  1.  Painful left total knee with clicking sensation.  2.  Right knee osteoarthritis.  3.  Occasional heartburn.  4.  Eczema.  5.  History of colon polyps with recent negative colonoscopy in 2004.   PLAN:  Patient is to be admitted to [facility] on November 19, 2003  and undergo a revision of the left total knee by Dr. Chaney Malling.       GC/MEDQ  D:   11/13/2003  T:  11/13/2003  Job:  [id number]

## 2010-06-26 NOTE — Discharge Summary (Signed)
[name], CHRISCOE                 ACCOUNT NO.:  [id number]   MEDICAL RECORD NO.:  [id number]          PATIENT TYPE:  INP   LOCATION:  5002                         FACILITY:  [facility]   PHYSICIAN:  Rodney A. Mortenson, M.D.DATE OF BIRTH:  [birth year]   DATE OF ADMISSION:  05/29/2004  DATE OF DISCHARGE:  06/08/2004                                 DISCHARGE SUMMARY   ADMISSION DIAGNOSES:  1.  Effusion with cloudy synovial fluid sent off to laboratory examination      from left total knee arthroplasty.  2.  History of right and left total knee arthroplasty.  3.  History of occasional heartburn.  4.  History of eczema.  5.  History of colon polyps.  6.  Obesity.   DISCHARGE DIAGNOSES:  1.  Status post left knee arthroscopy with debridement and extensive      synovectomy.  2.  Postoperative open extensive synovectomy and poly exchanged tibial      plateau, removal of patella nub and extensive debridement washout using      pulsed lavage.  3.  Postoperative sinus tachycardia.  4.  Acute blood loss anemia secondary to surgery.  5.  Hypokalemia.   HISTORY OF PRESENT ILLNESS:  [name] is a 60 year old white male with  history of left total knee arthroplasty revision October 2005 and right  total knee arthroplasty March 2006.  The patient reports walking last week  when he noted some increased discomfort in both knees.  Soreness on the  right knee resolved.  On the following Monday, he had residual pain in the  left knee.  The patient notes some swelling about the left knee.  He has a  difficult time ambulating due to discomfort in the left knee.  The patient  also has  noticed fevers and also feels as if there is increased warmth in  the left knee.  The patient was evaluated in the office April 2006 and found  to have a temperature of 97.6.  Does have a significant effusion which was  aspirated and found to have cloudy joint synovial fluid which was sent off  for cultures and  sensitivity, crystals, and cell count.  It was recommended  the patient be admitted for arthroscopic lavage of his left total knee  arthroplasty.  The patient also noted increased urinary frequency over the  last several days, but he denies any pain with urination.   ALLERGIES:  No known drug allergies.   MEDICATIONS:  1.  Motrin p.o. t.i.d. p.r.n.  2.  Multivitamins 1 p.o. daily  3.  Vicodin p.r.n.   SURGICAL PROCEDURES:  Procedure on May 29, 2004, the patient to the  operating room by Dr. Carola Frost.  He was placed on anesthesia, and irrigation  and debridement with extensive synovectomy of left knee was performed  orthoscopically.  The patient tolerated the procedure well and returned to  the recovery room in stable condition.   On June 02, 2004, the patient was taken to the operating room by Dr.  Chaney Malling, assisted by Jamelle Rushing, P.A.  The patient was placed  under  general anesthesia, and an extensive open synovectomy was performed on the  left knee.  Exchange of the tibia plateau, removal of the patella button,  and extensive debridement and washout of the left knee using pulsed lavage  was performed. The patient tolerated the procedure well and returned to  recovery in good and stable condition.   CONSULTATIONS:  The following consults were obtained.  1.  Infectious disease, Jasmine Pang, M.D.  2.  Pharmacy.  3.  Case management.   HOSPITAL COURSE:  On postop day #1, patient afebrile, vital signs stable.  The patient had no complaints.  White count 4000, hemoglobin 9.8, hematocrit  22.  Gram stain showed no organisms.  The patient was evaluated by  infectious disease initially and was continued on Ancef and then  transitioned to vancomycin and gentamycin.   On postop day #2, patient without any complaints.  The patient was afebrile.  White count 4000, hemoglobin 10.2, hematocrit 29.1.  Cultures remained  negative.   On postop day #3, patient afebrile, vital signs  stable.  Left knee culture  negative for organisms, moderate wbc's.  Glucose noted to be 104; therefore  hemoglobin A1c was checked.  The patient was placed on OR schedule for  repeat I&D.   On April 25, the patient was taken to the operating room.  Extensive  synovectomy and poly exchange tibial plateau and removal of patella were  performed.   On postop day #4, patient afebrile, vital signs stable.  Cultures remained  negative.  Hemovac in place with serous drainage.  Later on April 26, the  patient noted tachycardia, heart rate 110, T-max 99.3, and patient 93% on  room air.  ABG was ordered, and this showed low PO2 of 64, PCO2 of 43, and  pH of 7.7.  The patient denied shortness of breath, cough, chest pain.  No  change in bowel habits and no diarrhea, constipation, tarry stools.   Patient with some burning proximal incision.  The patient's T-max was 100.5,  pulse 104.  Hemoglobin 10.3, hematocrit 30.3, white blood count 6400.  PICC  line to be placed due to patient receiving IV antibiotics.   Medicine consult was ordered and chest CT to rule out pulmonary embolus was  ordered.  Chest x-ray performed.  Urinalysis and culture also sent due to  complaint of frequent urination.   On June 04, 2004, patient with no shortness of breath, no chest pain.  Nonproductive cough.  T-max 100.9, temperature current 98.8.  Chest x-ray,  which was ordered on April 26, was negative.  Chest CT was negative for  pulmonary embolism.  UA negative.  Cardiac enzymes were negative.  VQ scan  was negative.  Chest CT negative for PE; however, nodules were noted in the  left upper lobe; recommended limited study CT of chest in three months to  follow.   On April 28, postoperative dates #7/3, the had complaint of hot and cold  temperature/night, otherwise no complaints.  Pain under control.  No chest  pain, shortness of breath, nausea, vomiting,.  T-max 98.6.  White count 4900, hemoglobin 9.4, hematocrit  27.2.  Vital signs otherwise stable.  O2  saturation 94%.  It was felt the patient could be transferred back to [address]  floor.  Infectious disease switched the patient to __________ and Avelox.   On June 06, 2004, patient without complaints.  Pain well controlled.  Afebrile, vital signs stable.   On June 07, 2004, pain well controlled,  patient afebrile, vital signs  stable.  Hemoglobin 9.1, hematocrit 26.7, white count 4400.  Potassium was  low at 3.1 and was replaced.   On Jun 08, 2004, the patient transferring on own.  No chest pain, shortness  of breath, nausea or vomiting.  Pain well controlled without pain  medications.  Hemoglobin and hematocrit remained stable at 9.1 and 26.9,  respectively. White count 4600.  Hypokalemia was improving at 3.3.  The  patient had been afebrile over the entire weekend, remained afebrile on Jun 08, 2004, which was a Monday.  Therefore, it was felt that the patient could  be discharged home in good and stable condition.  The patient was to follow  up with Dr. Roxan Hockey in 6 to 8 weeks and continue antibiotics for a total of  9 months.  The patient's heart rate was 93 beats per minute.   LABORATORY DATA:  Routine labs on admission and CBC:  White blood count on  admission was 5800, hemoglobin 11.2, hematocrit 33, platelets 348.  Coags on  admission: All values within normal limits.  Routine chemistries on  admission:  All values within normal limits.  Hepatic enzymes:  All values  within normal limits.  Urinalysis on May 29, 2004, was negative.  Urinalysis on June 03, 2004, was also negative.  Blood gas obtained on  June 03, 2004, FIO2 was at 0.21%, pH 7.386, PCO2 43.7, PO2 64.9, bicarb  25.7.  TCN-P 27, ACE excess total 1.2, O2 saturation 93.3%.  Patient's  temperature was 37 degrees C.   ESR on May 29, 2004, was at 2 and elevated.  Hemoglobin A1c on June 01, 2004, was at 5.1.  TSH on June 03, 2004, was 2.175.  TSH on June 04, 2004,  was  4.327 and second was 2.730.  C-reactive protein on May 29, 2004, was  13, elevated.  Gram stain on June 02, 2004, showed 2 wbc's present but PMN  and mononuclear.  No organisms.  Abscess culture on May 29, 2004, showed  few wbc's present, predominantly PMN.  No squamous epithelial cells seen.  No organisms seen.  No growth for 2 days.  Tissue culture of knee dated  June 02, 2004, showed few wbc's present, PMN and mononuclear organisms  seen, no growth for 2 days.  Aerobic cultures May 29, 2004, showed  moderate wbc's present, predominantly PMN, no organisms seen.  No aerobes  isolated.  Aerobic cultures from left knee on June 02, 2004, showed few  wbc's present but PMN and mononuclear, no organisms seen, no anaerobes.   Chest x-ray dated June 04, 2004, showed tip of the PICC line to be in the  lower SVC.   CT angiography chest June 03, 2004, showed despite reorientation, there is not adequate opacification of the pulmonary arterial system to exclude  pulmonary emboli.  There was no evidence of central pulmonary emboli.  Nuclear medicine ventilation perfusion lung scan was recommended.  Tiny  areas of nodularity within the right upper lobe most likely representing  granuloma noted.  One of these nodules was not calcified.  It is felt that  followup limited CT scan chest in three months deemed necessary.  Minor  basilar atelectatic changes were noted.   Chest x-ray June 03, 2004, showed no evidence of active chest disease.   Pulmonary perfusion scan June 03, 2004, showed no evidence of acute  pulmonary embolism.   Chest x-ray June 04, 2004, showed PICC line with tip in the right  atrium,  withdraw approximately 8 cm was recommended.   Chest x-ray on June 04, 2004, showed tip of the PICC line to be in the  azygous vein, recommended withdrawal 2 cm.   Third chest x-ray on June 04, 2004, showed PICC tip line in the SVC.   EKG performed on June 03, 2004, showed sinus  tachycardia, heart rate 101  beats per minute, PR interval 166 msec, PRT axis 39, 22,10 .   DISCHARGE INSTRUCTIONS:   DISCHARGE MEDICATIONS:  The patient is to resume home medications except for  Vicodin while on Percocet.  The following medication will be added.  1.  Percocet 5/325 one to two tablets every 4 to 6 hours for pain.  2.  Propadrine 300 mg by mount 2 times a day at 6 a.m. and 6 p.m.  3.  Avelox 400 mg at 6 p.m. for a total of 9 months.  4.  Coumadin 8 mg night of discharge and then as dosed by pharmacy.  5.  Lovenox 70 mg subcutaneously q.24h until Coumadin therapeutic.   ACTIVITY:  Patient weightbearing as tolerated.   DIET:  No restrictions.   WOUND CARE:  The patient is to keep area clean and dry, change dressing  daily.  Call for any kinds of patient concern.   SPECIAL INSTRUCTIONS:  CPM 0 to 90 degrees 6 to 8 hours a day, increased by  10 degrees daily.   FOLLOW UP:  The patient needs to follow up with Dr. Chaney Malling in  approximately 8 days from time of discharge.  The patient is to call [phone] for appointment.  The patient also needs followup with Dr. Roxan Hockey in  six to eight weeks after discharge.  The patient is to call [phone] for  followup.      Bronson Curb   GC/MEDQ  D:  09/08/2004  T:  09/08/2004  Job:  [id number]

## 2010-06-26 NOTE — H&P (Signed)
[name], [name]                 ACCOUNT NO.:  [id number]   MEDICAL RECORD NO.:  [id number]          PATIENT TYPE:  INP   LOCATION:  NA                           FACILITY:  [facility]   PHYSICIAN:  Rodney A. 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   HISTORY OF PRESENT ILLNESS:  This 60 year old white male patient presented  to Dr. Chaney Malling with a history of multiple surgeries to that left knee.  Dr. Chaney Malling did a left knee scope back in July 1999, subsequently a knee  scope and a high tibial osteotomy in January 2001, then a left knee  replacement in July 2002.  He did well until his patella required revision  on November 19, 2003, and then he developed some symptoms where they were  worried about an infection.  In April 2006 he had an I&D done of that left  knee and was placed on oral antibiotics and was on that for about a year.  Once he was off antibiotics, about 3 to 3-and-a-half months later, the knee  became red, hot and swollen and he was having more difficulty with it.  He  came in to our office, had an aspiration which showed a high white cell  count.  He was admitted to the hospital and on September 28, 2005, the left  knee replacement was removed, the knee had I&D, and an antibiotic spacer was  placed.  Since that time he was on a month of IV vancomycin.  Nothing grew  from the knee.  He was then placed on oral doxycycline which he continues at  this time.  His pain is fairly well controlled at this time.  The knee is  relatively immobile.  Incisions well approximated and well healed.  He is  having no fevers, no drainage from the knee, and he is now presenting for a  removal of the spacer, repeat I&D, and revision left knee replacement.   ALLERGIES:  No known drug  allergies.   CURRENT MEDICATIONS:  1. Percocet 5/325 mg one to without tablets p.o. q.4h. p.r.n. for pain.  2. Aspirin 81 mg tablet p.o. q.a.m., last dose September 6.  3. Doxycycline 100 mg one tablet p.o. b.i.d.  4. IV Vancomycin, with the last dose was about October 28, 2005.  5. Laxatives and stool softeners p.r.n.  6. Probiotics p.o. q.a.m.   PAST MEDICAL HISTORY:  He denies any history of hypertension, diabetes  mellitus, thyroid disease, hiatal hernia, peptic ulcer disease, heart  disease, asthma, or any other chronic medical condition other than noted  previously.   PAST SURGICAL HISTORY:  1. Right knee arthroscopy May 22, 1996, by Dr. Rinaldo Ratel.  2. Left knee arthroscopy August 27, 1997, by Dr. Rinaldo Ratel.  3. Left knee arthroscopy with high tibial opening wedge osteotomy February 24, 1999, by Dr. Rinaldo Ratel.  4. Left total knee arthroplasty August 09, 2000, by Dr. Rinaldo Ratel.  5. Revision patellar component left total knee November 19, 2003.  6. Right total knee arthroplasty April 07, 2004, by Dr. Rinaldo Ratel.  7. I&D of left total knee arthroplasty June 02, 2004, by Dr. Rinaldo Ratel.  8. I&D and placement of antibiotic spacer with removal of left knee      replacement September 28, 2005, by Dr. Rinaldo Ratel.   The only complication he reports from surgery is some tachycardia after the  last knee surgery.   SOCIAL HISTORY:  He denies any history of cigarette smoking or drug use.  He  is not currently drinking any alcohol but used to drink about two to three  times a week, maybe two beers time.  He is single and lives by himself in a  [place] house.  He has currently been in a nursing home since the last  surgery.  He does not have any children.  His medical doctor is Dr. Everlene Other  at the [facility] at [place].   FAMILY HISTORY:  Mother is alive at age 55 with a history of a heart attack.  Father died at the  age of 68 with a heart attack.  He has one brother who is  alive at age 48 with a brain tumor.  He had four sisters, two who are  deceased.  All of them had a history of cancer including cervical,  pancreatic, liver and breast.   REVIEW OF SYSTEMS:  He does wear glasses.  He has had some difficulty  sleeping recently.  All other systems are negative and noncontributory.   PHYSICAL EXAMINATION:  GENERAL:  Well-developed, well-nourished, overweight  white male in no acute distress.  Talks easily with examiner.  Knee  immobilizer in place to the left leg.  He is walking with crutches.  Mood  and affect are appropriate.  Height 5 feet 10 inches, weight.  306 pounds,  BMI is 43.  VITAL SIGNS:  Temperature 97.8 degrees Fahrenheit, pulse 104, respirations  16, and BP 96/70.  HEENT:  Normocephalic, atraumatic, without frontal or maxillary sinus  tenderness to palpation.  Conjunctivae pink.  Sclerae anicteric.  PERLA.  EOMs intact.  No visible external ear deformities.  Hearing grossly intact.  Tympanic membranes pearly gray bilaterally with good light reflex.  Nose and  nasal septum midline.  Nasal mucosa pink and moist without exudates or  polyps noted.  Buccal mucosa pink and moist.  Dentition in good repair.  Pharynx without erythema or exudates.  Tongue and uvula midline.  Tongue  without fasciculations and uvula rises equally with phonation.  There is a  little reddened area on his chin.  No active drainage and no lesion.  It is  just underneath his beard.  NECK:  No visible masses or lesions noted.  Trachea midline.  No palpable  lymphadenopathy nor thyromegaly.  Full range of motion with the exception of  decreased extension.  No pain with range of motion of the neck.  Nontender  to palpation along the cervical spine.  Carotids +2 bilaterally without  bruits.  CARDIAC:  Heart rate and rhythm regular.  S1 and S2 present without rubs, clicks or murmurs noted.  He is a little tachycardiac  at this time.  RESPIRATORY:  Respirations even and unlabored.  Breath sounds clear to  auscultation bilaterally without rales or wheezes noted.  ABDOMEN:  Rounded abdominal contour.  Bowel sounds present x4 quadrants.  Soft, nontender to palpation without hepatosplenomegaly nor CVA tenderness.  Femoral pulses +2 bilaterally.  Nontender to palpation along the vertebral  column.  BREAST/GENITOURINARY/RECTAL:  These exams deferred at this time.  MUSCULOSKELETAL:  No obvious deformities bilateral upper extremities with  full range of motion of these extremities without pain.  Radial pulses +2  bilaterally.  Full range of motion of his hips, ankles and toes bilaterally.  DP and PT pulses are +2.  No calf pain with palpation.  Negative Homans'  sign bilaterally.  KNEES:  Right knee has a well-healed midline incision.  He has full  extension and flexion to 110 degrees without pain or crepitus.  No pain with  palpation along the joint line.  No effusion.  Stable to varus and valgus  stress.  Negative anterior drawer.  Left knee has a well-healed midline  incision.  There is really minimal range of motion of that knee at this  time.  There is probably a +2 effusion in the knee joint itself.  NEUROLOGIC:  Alert and oriented x3.  Cranial nerves II-XII are grossly  intact.  Strength 5/5 bilateral upper and lower extremities.  Sensation  intact to light touch.  Deep tendon reflexes 2+ bilateral upper and lower  extremities.   RADIOLOGIC FINDINGS:  X-rays taken of his left knee in September show the  antibiotic prosthesis to be in the appropriate position with no dislocation.   IMPRESSION:  1. 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  2. History of right total knee.  3. Tachycardia.   PLAN:  Mr. Levi will be admitted to [facility] on November 18, 2005,  where he will undergo removal of the antibiotic spacer, I&D, and then  revision of his infected left total knee  arthroplasty by Dr. Rinaldo Ratel.  He will undergo all the routine preoperative laboratory tests  and studies prior to this procedure.  If we have any medical issues while he  is hospitalized we will consult one of the hospitalists.      Legrand Pitts Duffy, P.A.    ______________________________  Lenard Galloway Chaney Malling, M.D.    KED/MEDQ  D:  11/15/2005  T:  11/16/2005  Job:  [id number]

## 2010-06-26 NOTE — Consult Note (Signed)
NAMEJAION, LAGRANGE                 ACCOUNT NO.:  [id number]   MEDICAL RECORD NO.:  [id number]          PATIENT TYPE:  INP   LOCATION:  4733                         FACILITY:  [facility]   PHYSICIAN:  Theone Stanley, MD   DATE OF BIRTH:  26-Jun-1950   DATE OF CONSULTATION:  DATE OF DISCHARGE:                                   CONSULTATION   REASON FOR CONSULTATION:  1.  Tachycardia.  2.  Fever.  3.  Low PO2 on ABG.   REQUESTING PHYSICIAN:  Thereasa Distance A. Chaney Malling, M.D.   HISTORY OF PRESENT ILLNESS:  Mr. Pavich is a 60 year old Caucasian gentleman  presenting to the hospital with chief complaint of swollen left knee.  He  recently had a total left knee arthroplasty revision on October of 2005 and  a right total knee arthroplasty on May 05, 2004.  Apparently, he was out  walking recently and increasing discomfort in both knees.  He had resolution  of his pain in his right side.  However, his left side continued to cause  some discomfort.  He subsequently went to Dr. Andreas Blower office.  He was  noted to have fevers over the past 24 hours.  His knee was hot and increased  warmth.  Upon evaluation in the office, it was noted that temperature was  97.6.  He, however, had a significant effusion on the left side.  He had an  aspiration procedure in the office.  It was found to have cloudy fluid and  he was admitted for a arthroscopic lavage of his left total knee.  This was  performed on April 21st and went without any complications.  However, it was  noted since then he has been tachycardic, intermittent fevers.  He has been  on and off oxygen but he was 93% on room air.  Because of these issues, an  ABG was normal which did show a low PO2 of 64, a PCO2 of 43 and a pH of  7.38.  Currently, the patient complains of some frequency but no shortness  of breath, no cough, no chest pain, no change in the bowel habits, no  diarrhea, constipation, dark tarry stools.   HOME MEDICATION:  Motrin,  multivitamin, Vicodin p.r.n.   PAST MEDICAL HISTORY:  1.  Bilateral total knee arthroplasties.  2.  Dyspepsia.  3.  Eczema.  4.  Obesity.  5.  History of colon polyps.   PAST SURGICAL HISTORY:  1.  Tonsillectomy.  2.  Polypectomy.  3.  Arthroscopes, right and left knee.  4.  High tibial osteotomy, left knee.  5.  Left knee arthroplasty in 2002.  6.  Revision in 2005.   ALLERGIES:  No known drug allergies.   SOCIAL HISTORY:  The patient denies tobacco use, occasional alcohol.  He is  single and he is employed as a Location manager.   FAMILY HISTORY:  Significant for a stroke and MI.   REVIEW OF SYSTEMS:  Please see HPI.  Otherwise negative.   PHYSICAL EXAMINATION:  VITAL SIGNS:  Temperature 100.9, pulse 114,  respiratory rate 20, blood  pressure 116/75, saturations 95% on room air.  HEENT:  Normocephalic and atraumatic.  Oropharynx was clear.  HEART:  Regular rate and rhythm.  Tachycardic.  No murmurs, rubs, or gallops  appreciated.  LUNGS:  Clear to auscultation bilaterally.  ABDOMEN:  Obese, soft, nontender, nondistended.  EXTREMITIES:  No edema, cyanosis or clubbing.  The patient had bandages over  his left knee and had evidence of a recent incision which appeared to be  healing nicely.  NEURO:  The patient alert and oriented x 3.  SKIN:  No evidence of rashes.   ASSESSMENT/PLAN:  Tachycardic, intermittent fevers.  Of course, concern for  infection is a possibility.  The patient is currently on vancomycin.  A  chest x-ray was performed.  He will need another urinalysis and culture  since he is complaining of frequency.  In addition, very reasonable that Dr.  Chaney Malling has ordered a CTA to check for a pulmonary embolism.  In the  meantime, suggest starting heparin until PA can be ruled out.  Pain  management is also a consideration.  However, the patient states that his  pain is well-controlled.      AEJ/MEDQ  D:  06/03/2004  T:  06/03/2004  Job:  [id number]

## 2010-06-26 NOTE — Discharge Summary (Signed)
[place]. [facility]  Patient:    [name], [name]                        MRN: [id number] Adm. Date:  [id number] Disc. Date: [id number] Attending:  Cornell Barman Dictator:   Jamelle Rushing, P.A.C.                           Discharge Summary  ADMISSION DIAGNOSES: 1. Osteoarthritis left knee. 2. Obesity.  DISCHARGE DIAGNOSES: 1. Left total knee arthroplasty. 2. Insomnia. 3. Constipation. 4. Removal of internal fixation device.  HISTORY OF PRESENT ILLNESS:  The patient is a 60 year old white male with a history of high tibial osteotomy in January of 2001.  The patient had minimal improvement with that procedure. The patient is also having tendency to fall. He does have a sensation of his knees giving out. The pain is severe, dull ache at all times.  He does have clicking and popping and night pain.  ALLERGIES:  No known drug allergies.  MEDICATIONS: 1. Ibuprofen 600 mg p.o. q.a.m. and p.r.n. 2. Cortisone injection on July 31, 2000.  SURGICAL PROCEDURE:  On August 09, 2000, the patient was taken to the OR by Thereasa Distance A. Chaney Malling, M.D., assisted by Jamelle Rushing, P.A.C.  The patient, under general anesthesia, had removal of two bone staples at the osteotomy site left knee with a total knee replacement on the left using a cemented large left DePuy femoral component with a size 5 cemented tibial tray with a 12.5 large poly insert and a rotating three-peg cemented patella large size. The patient tolerated this procedure well and was transferred to the recovery room in excellent condition with one Hemovac drain left in place.  No complications.  CONSULTS:  On August 09, 2000, the patient had the following routine consults, physical therapy, occupational therapy, rehab, and care management.  HOSPITAL COURSE:  On August 09, 2000, the patient was admitted to [facility]. [facility] under the care of Dr. Thereasa Distance A. Mortenson.  The patient was taken to  the OR where a left total knee arthroplasty was performed after removal of two high tibial osteotomy staples. The patient was placed on Arixtra 2.5 mg subcu q.d. starting on postop day #1 for DVT prophylaxis.  The patient then incurred a four-day postoperative course in which the patient did have some slight problems with insomnia and constipation.  The patient initially was tried on Ambien which did not work very well but then was changed over to Restoril which did help a little bit better. Constipation was relieved with an enema on postop day #3. Otherwise the patients vital signs remained stable, had minimal amount of blood loss due to the surgical procedure. The left knee remained neuromotor vascularly intact. The wound remained benign for any signs of infection.  The patient was placed on iron two improve his H&H over the course of the next month and the patient was managed very well with p.o. pain medications.  Arrangements were made for discharge on postop day #4 and was done so in good condition.  LABS:  On August 13, 2000, CBC showed wbc 5.6, hemoglobin 9.9, hematocrit 28.2, platelets 228.  Coag. studies on admission showed PT 12.5, INR 1.0, PTT was 28.  Routine chemistries on August 11, 2000, showed sodium 136, potassium 3.8, glucose 121, BUN 7, creatinine 0.9.  Routine urinalysis on admission was normal.  MEDICATIONS TAKEN UPON DISCHARGE FROM ORTHOPEDIC FLOOR: 1. Arixtra 2.5 mg subcu q.24h. 2. Colace 100 mg p.o. b.i.d. 3. OxyContin SR 10 mg p.o. q.12h. 4. Ferrous sulfate 325 mg p.o. b.i.d. 5. Laxative or enema of choice. 6. Robaxin 500 mg p.o. q.6h. p.r.n. 7. Percocet one or two tablets every four to six hours p.r.n. breakthrough    pain.  DISCHARGE INSTRUCTIONS: 1. Medications:    a. Percocet 5 mg one tablet every four hours to six hours when needed for       pain.    b. OxyContin CR 10 mg one tablet every 12 hours.    c. Arixtra 2.5 mg injection subcutaneously one per day  for the next five       days.    d. Once Arixtra is used up, take one aspirin a day. 2. Activity:  Weightbearing as tolerated. 3. Diet:  No restrictions. 4. Wound care:  Keep incision clean and dry. 5. Follow-up:  The patient is to call for follow-up visit with Dr.    Chaney Malling.  CONDITION ON DISCHARGE:  The patients condition on discharge to home is listed as improved and good. DD:  09/08/00 TD:  09/08/00 Job: [id number]

## 2010-06-26 NOTE — Op Note (Signed)
[place]. [facility]  Patient:    [name], [name]                        MRN: [id number] Proc. Date: 08/09/00 Adm. Date:  [id number] Attending:  Cornell Barman                           Operative Report  PREOPERATIVE DIAGNOSIS:  Severe osteoarthritis left knee; status post valgus osteotomy left proximal tibia.  POSTOPERATIVE DIAGNOSIS:  Severe osteoarthritis left knee, status post valgus osteotomy left proximal tibia.  OPERATION:  Removal of two bone staples at osteotomy site left knee; total knee replacement on the left using a cemented large left DePuy femoral component with a size 5 cemented tibial tray with a 12.5 large poly insert and a rotating 3-peg cemented patella large size.  SURGEON:  Lenard Galloway. Chaney Malling, M.D.  ASSISTANT:  Jamelle Rushing, P.A.  ANESTHESIA:  General.  DESCRIPTION OF PROCEDURE:  The patient was placed on the operating table in supine position with a pneumatic tourniquet about left upper thigh.  The entire left lower extremity was prepped with Duraprep and then draped out in usual manner.  A midline incision was made and carried down to the tibial tubercle.  The skin edges were retracted.  Bleeders were coagulated. Dissection was carried laterally to the tibial fixation pin.  The step-cut fixation staple was found.  Soft tissue was released and there was bony overgrowth over the staple.  A small osteotome was used to remove the bony encasement.  Once this was completed, the ______ was used to remove the staple.  In a similar manner carried off on the medial side of the tibia. This carried down to just proximal to the medial collateral ligament and the bone staple was found.  The soft tissue was incised.  A small osteotome was used to remove bone and then the staple was excised.  At this point, attention was turned to the knee.  A long median parapatellar incision was made.  The patella was everted laterally.   Osteophytes off the femoral condyles and margins of the patella were then excised with a rongeur.  The knee was flexed 90 degrees.  Tibia guide #1 was placed over the anterior aspect of the tibia. Prior to this, both medial and lateral menisci had been excised as was the patella fat pad.  A guide was set up and the cutting block placed where it was felt to be the appropriate angle.  This was fixed with fixation pins.  The guide was removed and a tower was applied.  Excellent alignment of the cutting block was achieved.  A capture guide was then used and the proximal end of the tibia was amputated.  An excellent cut was achieved.  Great care was taken to avoid any injury to the posterior structures.  Anterior cruciate and posterior cruciate ligament were released off the femur.  Attention was then turned to the proximal femur.  The knee flexed 90 degrees and femoral cut and block #1 was placed over the anterior aspect of the femur and drill holes were made for the intramedullary rod.  The C-clamp was then placed in the femoral cutting block #1 with the knee flexed 90 degrees and held flush on the tibial surface.  Fixation pins were then used to affix the cutting block to the femur.  The capture was then used and  the anterior and posterior femoral cuts were made. Femoral guide #2 was then inserted over the anterior aspect of the femur through the intramedullary rod.  This had 4 degrees of valgus.  This was locked in place with fixation pins.  The capture guide was used and the distal femur was amputated.  A 12.5 spacer was inserted and excellent balancing of the collateral ligaments to varus and valgus stress and in extension and flexion was achieved.  This was felt to be the perfect soft tissue balance. Femoral guide #3 was placed over the distal end of the femur and locked in place with fixation pins.  The chamfer cuts were made and drill holes were placed in the distal end of the femur.   The femoral guide was then removed and posterior condyles were cleaned of osteophytes.  Attention then turned to the proximal tibia.  A McHale retractor was used to sublux the tibia anteriorly. Several trials were used and a size 5 tibial tray seemed to be appropriate. This was held in perfect position and fixed with fixation pins.  Drill holes were then placed for the keel.  Once this was accomplished, the tower was removed and the ______ keel was inserted.  Fixation pins were removed from the tibial tray.  A 12.5 mm poly trial spacer was inserted and the femoral components articulated over the distal end of the femur.  The knee was put through a full range of motion.  There was full extension with excellent stability to varus and valgus stress and there was full flexion.  Attention then turned to posterior aspect of the patella.  Using a patella cutting guide, the posterior aspect of the patella was amputated.  The patella guide with three holes were then used and drill holes were made.  The patella trial was then inserted in the drill holes and the knee was put through a full range of motion.  There was significant tightness laterally because of the valgus nature of the knee.  An extensive lateral release was done.  There were osteophytes behind the patella tendon at the level of the osteotomy site and these were removed.  Once this was accomplished, there was excellent tracking of the patella in the midline.  All the components were then removed. Pulsating lavage was used.  All debris was removed.  Glue was then mixed and glue placed on proximal tibia and undersurface of the tibial tray and the tibial was inserted and impacted.  Excess glue was removed.  Glue was placed over the distal end of the femur and the posterior aspect of the femoral trial and the femoral ______ in the femoral prosthesis and the final femoral component was impacted.  Excess glue was removed.  The poly insert had  been put in place and knee extended.  Excess glue was then squeezed out.  Excellent alignment was achieved.  All the extra glue was then removed while it was still soft.  The patella prosthesis placed on the posterior aspect of the  patella, which had been prepped with glue.  The clamp was applied and the excess glue was removed.  The knee was irrigated with copious amounts of antibiotic solution throughout the procedure.  Once the glue was dried, the knee was flexed and excess glue was removed with a small osteotome.  The poly trial was removed.  Pulsating lavage was used.  Tourniquet was dropped and all bleeders were coagulated and hemostasis was controlled very nicely.  The final 12.5 mm  poly insert was then inserted and the knee was put through a full range of motion.  Again, there was excellent balancing of the ligaments and excellent tracking of the patella.  Using heavy Ethibond sutures, the long parapatellar incision was closed.  A Hemovac drain was inserted.  Vicryl was used to close the subcutaneous tissue and stainless steel staples used to close the skin.  Sterile dressings were applied and the patient returned to recovery room in excellent condition.  Technically, this procedure went extremely well.  Drains - Hemovac, complications none. DD:  08/09/00 TD:  08/09/00 Job: [id number]

## 2010-06-26 NOTE — H&P (Signed)
[name], [name]                 ACCOUNT NO.:  [id number]   MEDICAL RECORD NO.:  [id number]          PATIENT TYPE:  INP   LOCATION:  5023                         FACILITY:  [facility]   PHYSICIAN:  Lenard Galloway. Mortenson, M.D.DATE OF BIRTH:  12-20-1950   DATE OF ADMISSION:  05/29/2004  DATE OF DISCHARGE:                                HISTORY & PHYSICAL   CHIEF COMPLAINT:  Painful swollen left total knee arthroplasty.   HISTORY OF PRESENT ILLNESS:  The patient is a 60 year old white male with a  history of left total knee arthroplasty revision October 2005 and a right  total knee arthroplasty in May 05, 2004. The patient was out walking this  last weekend when he noted some increased discomfort in both knees. He had  resolution of the soreness in his right total knee the following Monday, but  over this last week he has been having continued and worsening discomfort in  his left total knee arthroplasty. The patient had noted some increased  swelling in there. He is having a difficult time ambulating due to his  discomfort. The patient also has noted over the last 24 hours fevers,  feeling like he is hot, with increased warmth about the left total knee. The  patient is in the office today for evaluation. His temperature was 97.6  orally. He was found to have a significant effusion which was aspirated and  found to have cloudy joint synovial fluid which was sent off for a culture,  sensitivity, crystal exam, and cell count. It was recommended that the  patient be admitted for arthroscopic lavage of his left total knee  arthroplasty. The patient has also noted some increased urinary frequency  over the last several days but he denies any pain with urination, so will  also evaluate him for a possible urinary tract infection.   ALLERGIES:  No known drug allergies.   CURRENT MEDICATIONS:  1.  Motrin p.o. t.i.d. p.r.n.  2.  Multivitamin one tablet daily.  3.  Vicodin p.r.n.   PAST MEDICAL  HISTORY:  1.  History of bilateral total knee arthroplasties.  2.  Occasional heartburn.  3.  Eczema.  4.  Obesity.  5.  History of colon polyps.   PAST SURGICAL HISTORY:  1.  Tonsillectomy.  2.  Polypectomy.  3.  Arthroscopies right and left knee.  4.  High tibial osteotomy left knee.  5.  Left total knee arthroplasty in 2002 with revision in 2005.  6.  Right total knee arthroplasty in 2006.   The patient denies any complications with the above-mentioned surgical  procedures but he does have a problem with urinary retention  postoperatively.   SOCIAL HISTORY:  The patient denies any tobacco use. He occasionally uses  alcohol beverage. He is single. Primary care physician is Dr. __________ at  [facility], [phone]. The patient lives in a 1-story house. He is  employed as a Location manager.   FAMILY MEDICAL HISTORY:  Mother is alive at 22 with a history of stroke.  Father is deceased at 35 from an MI.  REVIEW OF SYSTEMS:  Negative for all other complaints in the cardiac,  respiratory, general with the exception of increased urinary frequency over  the last several days, increased temperature, feeling like he is hot and  uncomfortable, and pain in his left total knee arthroplasty. The patient  states currently his right total knee arthroplasty is asymptomatic.   PHYSICAL EXAMINATION:  GENERAL:  The patient is a well-developed, slightly  centrally-obese white male, able to ambulate without the use with a cane but  with a significant limp and obvious discomfort.  NECK:  Supple. He had good range of motion.  CHEST:  Clear and equal.  HEART:  Regular rate and rhythm.  ABDOMEN:  Round, obese, soft and nontender.  EXTREMITIES:  Upper extremities were symmetrically sized and shaped. No  obvious abnormalities. Lower extremities:  Left knee was round, swollen-  appearing. No signs of erythema. It was warm, had a well-healed midline  surgical incision. He was able to flex  his knee to about 90 degrees before  discomfort due to swelling. Right total knee arthroplasty has a well-healed  midline surgical incision, no effusion. He has got good range of motion.  Distal legs are neuromotor vascularly intact with significant amount of  lower extremity pigmentation changes throughout.   IMPRESSION:  1.  Effusion with cloudy synovial fluid sent off for a laboratory      examination.  2.  History of right and left total knee arthroplasty.  3.  History of occasional heartburn.  4.  History of eczema.  5.  History of colon polyps.  6.  Obesity.   PLAN:  The patient will be admitted to [facility] this date for an  arthroscopic lavage of his left total knee arthroplasty with appropriate IV  antibiotics postoperatively. We will also get a urinalysis for evaluation of  consideration of possible UTI as source. The patient will be admitted to Dr.  Andreas Blower service and Dr. Carola Frost will do the arthroscopic lavage.      RWK/MEDQ  D:  05/29/2004  T:  05/30/2004  Job:  [id number]

## 2010-06-26 NOTE — Discharge Summary (Signed)
[place]. [facility]  Patient:    [name]                         MRN: [id number] Adm. Date:  [id number] Disc. Date: [id number] Attending:  Cornell Barman Dictator:   Jamelle Rushing, P.A.C.                           Discharge Summary  ADMISSION DIAGNOSES: 1. Progressively worsening osteoarthritis of the left knee secondary to an injury. 2. Severe osteoarthritis, right knee. 3. Obesity.  DISCHARGE DIAGNOSES: 1. Status post left knee arthroscopy with high tibial valgus osteotomy. 2. Obesity. 3. Severe medial compartment right knee arthritis.  HISTORY OF PRESENT ILLNESS:  This is a 60 year old white male who fell injuring his left knee at work in March of 1999.  The patient underwent a left knee arthroscopy on July of 1999 for a medial meniscal tear with multiple loose bodies and damage to the medial femoral condyle and the medial tibial plateau.  Since January of 1999 the patient has progressively worsening left knee pain.  The pain ranges from dull to sharp, intermittent with no radiation and is located over the medial joint line and worsens with initial standing or twisting movements of the knee.  The pain s somewhat decreased with the use of ibuprofen.  The patient does have locking and giving away of the knee.  ALLERGIES:  No known drug allergies.  MEDICATION:  Occasional ibuprofen 800 mg t.i.d. p.r.n. pain.  HOSPITAL COURSE:  On February 24, 1999, the patient was taken to the OR by Thereasa Distance A. Chaney Malling, M.D., assisted by Arnoldo Morale, P.A.C.  Under general anesthesia the patient had a diagnostic arthroscopy of his left knee with chondroplasty of the  medial condyle and debridement of the medial meniscus.  The patient then underwent a valgus closing wedge osteotomy of the left tibial with fixation of a staple and Krackow plate.  The patient tolerated the surgical procedure well.  There were o complications and he was  transferred to the recovery room in good condition.  CONSULTATIONS:  On January 16 the following routine consults were requested. Physical therapy, occupational therapy, care management, pharmacy for Coumadin dosing.  HOSPITAL COURSE:  The patient was admitted to [facility] under the care of Dr. Thereasa Distance A. Mortenson on January 16.  The patient underwent a arthroscopic evaluation and chondroplasty with valgus wedge osteotomy of the proximal tibia under general anesthesia.  The patient tolerated the surgical procedure well and was transferred to the recovery room and then to the orthopedic floor without any complications.  Postop day #1 the patient complained of pain and having little sleep the previous night, was unable to void and had in-and-out catheterization performed.  The patients vital signs remained stable.  His hemoglobin and hematocrit was also stable at 11.5 and 33.4.  Left leg dressing was intact.  There was no Homans sign and distal leg was neuromotor vascularly intact.  The plan was to continue the CA and Percocet on a p.r.n. basis.  Out of bed with physical therapy. Nonweightbearing on the left leg.  We would continue to monitor in-and-out catheterization and urinary progress.  On post day #2, the patient indicated he was in significant less pain than the previous day.  His distal leg was neuromotor vascularly intact.  The patients hemoglobin and hematocrit was stable at 11.4 and 32.9.  The  wound had some slight serosanguineous discharge but no erythema.  The patient had his PCA discontinued and was placed on OxyContin CR with Percocet for pain management.  Postop day #3, the pain was well-controlled until he slipped in the bathroom the previous evening.  The leg currently looks benign.  Currently his brace has been adjusted and the patient had no other complaints at the present time.  The patients vital signs are stable and his hemoglobin and hematocrit  also remains stable.  His OxyContin CR was increased to 20 mg p.o. q.12h.  Also due to his using very frequent Percocet p.r.n.  Postop days #4 and 5, the patient continued without any complaints.  He was tolerating his diet well.  He has had a bowel movement.  His home equipment had  been delivered.  The patients vital signs were stable.  His left knee had a moderate amount of ecchymosis about the knee and proximal calf, but there was no erythema and no Homans sign and distal leg was neuromotor vascularly intact. The patient was discharged to home on postop day #5 in improved condition.  DISCHARGE INSTRUCTIONS:  The patients discharge instructions are to resume all routine meds and diet.  OxyContin CR 20 mg q.12h, Percocet one or two tablets q.4-6h. for extra pain if needed, Coumadin per pharmacy dosing, Colace one tablet q.12h. until medication is gone.  The patient is to remain nonweightbearing on his left leg and must use walker when ambulating.  WOUND CARE:  Dressing changes daily until no discharge is present.  The patient is to have home health RN for pro times.  The patient is to follow up with Dr. Chaney Malling on the following Wednesday.  LABORATORY DATA:  On admission, the patients CBC was within normal limits on January 19, the patients wbcs were 4.8, hemoglobin 11.1, hematocrit of 31.6 with a 207,000 platelets.  PT on discharge is 19.8 with a 2.3 INR.  Routine chemistries were within normal limits on admission as was urinalysis.  DISCHARGE MEDICATIONS:  The patients medications on discharge are the following: 1. OxyContin CR 20 mg p.o. q.12h. 2. Coumadin per pharmacy dosing. 3. Senokot one tablet p.o. b.i.d. before meals. 4. Colace 100 mg p.o. b.i.d. 5. Percocet one or two tablets q.4-6h. p.r.n. pain.  CONDITION ON DISCHARGE:  The patients condition on discharge to home is listed s improved and good. DD:  03/26/99 TD:  03/26/99 Job: [id number]

## 2010-06-26 NOTE — Op Note (Signed)
[name], [name]                 ACCOUNT NO.:  [id number]   MEDICAL RECORD NO.:  [id number]          PATIENT TYPE:  INP   LOCATION:  5023                         FACILITY:  [facility]   PHYSICIAN:  Doralee Albino. Carola Frost, M.D. DATE OF BIRTH:  March 16, 1950   DATE OF PROCEDURE:  05/29/2004  DATE OF DISCHARGE:                                 OPERATIVE REPORT   PREOPERATIVE DIAGNOSIS:  Infected left total knee.   POSTOPERATIVE DIAGNOSIS:  Infected left total knee.   PROCEDURE:  Irrigation and debridement with extensive synovectomy of the  left knee arthroscopically.   SURGEON:  Doralee Albino. Carola Frost, MD.   ASSISTANT:  None.   ANESTHESIA:  General.   COMPLICATIONS:  None.   SPECIMENS:  Two sent for culture to Pathology.   DRAINS:  One.   ESTIMATED BLOOD LOSS:  50 mL.   DISPOSITION:  To PACU.   CONDITION:  Stable.   BRIEF SUMMARY OF INDICATION FOR PROCEDURE:  [name] is a 60 year old white  male with a several day history of progressive left knee pain.  This has  also been associated with erythema and increasing fluid.  He was seen and  evaluated by Dr. Chaney [name] in the clinic where aspiration revealed fluid  indicative of infection.  Sed rate was also elevated over 60.  After  discussion of the risks and benefits of surgery, the patient wished to  proceed with urgent I&D and synovectomy.   BRIEF DESCRIPTION OF PROCEDURE:  Antibiotics were held until [name] had  had cultures obtained from his knee.  General anesthesia was induced.  His  left lower extremity was prepped and draped in the usual sterile fashion.  A  leg holder was used, and tourniquet was placed around the thigh, but never  inflated during the case.  A standard inferolateral portal was established  and this resulted in significant egress of viscous, cloudy fluid from the  knee.  This was cultured both anaerobically and aerobically and sent for  evaluation and microbiology.  An extensive synovectomy was then started on  the knee.  We did administer 2 grams of Ancef at that time.  I began in the  suprapatellar pouch removing loose collections of fibrous tissue as well as  adherent synovium, and we continued along the medial gutter and then worked  along the lateral gutter, switching the shaver and camera.  We were  cognizant of trying to avoid injury to his components made difficult by his  size and the extensive amount of synovium that required debridement.  We  then worked into the anterior portion of the knee where there was a  significant synovium, more careful to avoid injury to the extensor mechanism  as well as to the patellar component.  After the synovectomy was complete,  we then flushed the knee, which still remained somewhat cloudy, with 9000 mL  of fluid.  A deep, medium-large Hemovac was placed through the inferolateral  portal and sutures placed on both sides.  A sterile dressing with Ace wraps  from foot to thigh was then applied.  The patient was  awakened from  anesthesia and transported to the PACU in stable condition.   PROGNOSIS:  [name] prognosis with this infection is unclear.  It did not  appear to be a longstanding infection, but it was significant.  We were able  to obtain an extensive debridement of the synovium, but believe that Mr.  [name] will benefit from a return to the OR to remove what might be more  clearly seen on a second look.  He will be on IV antibiotics and we will  follow his cultures carefully.  Certainly the stakes are very high given  that [name] has other total joints and that this is not his first return  to the OR for his left knee.      MHH/MEDQ  D:  05/29/2004  T:  05/31/2004  Job:  [id number]

## 2010-08-18 ENCOUNTER — Observation Stay (HOSPITAL_COMMUNITY)
Admission: EM | Admit: 2010-08-18 | Discharge: 2010-08-20 | Disposition: A | Discharge status: Home / Self Care | Payer: Worker's Compensation | Attending: Internal Medicine | Admitting: Internal Medicine

## 2010-08-18 ENCOUNTER — Observation Stay (HOSPITAL_COMMUNITY): Payer: Worker's Compensation

## 2010-08-18 DIAGNOSIS — M79609 Pain in unspecified limb: Secondary | ICD-10-CM | POA: Insufficient documentation

## 2010-08-18 DIAGNOSIS — L02419 Cutaneous abscess of limb, unspecified: Principal | ICD-10-CM | POA: Insufficient documentation

## 2010-08-18 DIAGNOSIS — E039 Hypothyroidism, unspecified: Secondary | ICD-10-CM | POA: Insufficient documentation

## 2010-08-18 DIAGNOSIS — Z86711 Personal history of pulmonary embolism: Secondary | ICD-10-CM | POA: Insufficient documentation

## 2010-08-18 DIAGNOSIS — L03119 Cellulitis of unspecified part of limb: Secondary | ICD-10-CM

## 2010-08-18 LAB — BASIC METABOLIC PANEL
BUN: 18 mg/dL (ref 6–23)
Chloride: 105 mEq/L (ref 96–112)
GFR calc Af Amer: >60 mL/min (ref >60)
GFR calc non Af Amer: >60 mL/min (ref >60)
Potassium: 4 mEq/L (ref 3.5–5.1)
Sodium: 137 mEq/L (ref 135–145)

## 2010-08-18 LAB — DIFFERENTIAL
Basophils Absolute: 0 10*3/uL (ref 0.0–0.1)
Basophils Relative: 0 % (ref 0–1)
Eosinophils Absolute: 0.1 10*3/uL (ref 0.0–0.7)
Eosinophils Relative: 2 % (ref 0–5)
Monocytes Absolute: 0.9 10*3/uL (ref 0.1–1.0)

## 2010-08-18 LAB — CBC
HCT: 38.3 % — ABNORMAL LOW (ref 39.0–52.0)
MCHC: 33.4 g/dL (ref 30.0–36.0)
Platelets: 213 10*3/uL (ref 150–400)
RDW: 14.4 % (ref 11.5–15.5)
WBC: 7 10*3/uL (ref 4.0–10.5)

## 2010-08-19 ENCOUNTER — Encounter: Payer: Self-pay | Admitting: Internal Medicine

## 2010-08-19 DIAGNOSIS — L02419 Cutaneous abscess of limb, unspecified: Secondary | ICD-10-CM

## 2010-08-19 DIAGNOSIS — L03119 Cellulitis of unspecified part of limb: Secondary | ICD-10-CM

## 2010-08-19 LAB — SEDIMENTATION RATE: Sed Rate: 32 mm/hr — ABNORMAL HIGH (ref 0–16)

## 2010-08-19 NOTE — H&P (Incomplete)
Hospital Admission Note Date: 08/19/2010  Patient name: [name] Medical record number: [id number] Date of birth: 01-16-1951 Age: 60 y.o. Gender: male PCP: Dr. Earley Abide with [facility] on [address]  Attending physician:  Dr. 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 He presents to the [facility] emergency department with a three-day history of progressive pain in his lateral left lower extremity. He states he first noted some discomfort on Sunday. He describes his pain as a constant burning sensation that is 10 out of 10 at its worst and is currently a 4/10 it. He notes that his pain is exacerbated with touching or with wearing compression hose. He states he has not noticed any redness or formation of a boil however he is unable to see his legs. He denies any fevers, chills, nausea, vomiting, diarrhea, shortness of breath, chest pain or other complaints.  Bedside incision and drainage was performed in the emergency room after Os with a report of minimal to no drainage elicited from the wound.   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 Father with coronary artery disease and MI at age 22, deceased at age 89 from old age.  One sister, living, with breast cancer, one sister deceased from liver cancer, one sister deceased from pancreatic cancer, and one nephew alive with pancreatic cancer.  Social history: Patient lives alone in [place]. He is currently unemployed. He does not smoke. He admits to occasional alcohol use but does not drink on a regular basis. He does not use any illicit drugs.  Review of Systems: Pertinent items are noted in HPI.  Physical Exam: Vital signs: T.: 98.0, HR: 87, BP: 127/86, RR: 18, O2 sat: 96% on room air VItal signs reviewed and stable. GEN: No apparent distress.  Alert and oriented x 3.  Pleasant, conversant, and cooperative to exam. HEENT: head is autraumatic and normocephalic.  Neck is supple without palpable masses or lymphadenopathy.  No JVD or carotid bruits.  Vision intact.  EOMI.  PERRLA.  Sclerae anicteric.  Conjunctivae without pallor or injection. Mucous membranes are moist.  Oropharynx is without erythema, exudates, or other abnormal lesions.   RESP:  Lungs are clear to ascultation bilaterally with good air movement.  No wheezes, ronchi, or rubs. CARDIOVASCULAR: regular rate, normal rhythm.  Distant but clear S1, S2, no murmurs, gallops, or rubs. ABDOMEN: soft,  non-tender, non-distended.  Bowels sounds present in all quadrants and normoactive.  No palpable masses. EXT: warm and dry.  Peripheral pulses equal, intact, and +2 globally.  No clubbing or cyanosis.  No edema in bilateral lower extremities.  An incision approximately 1 inch in length is present on the mid lateral lower left extremity.  There is surrounding erythema and induration extending approximately 9 inches circumferentially from incision. There is no palpable fluctuance or crepitus.  No purulent, serosanguinous, or bloody drainage is expressible on exam. The area is significantly tender to palpation.  There is no erythema, induration or tenderness to palpation of the left knee SKIN: warm and dry with normal turgor.  No rashes or abnormal lesions observed. NEURO: CN II-XII grossly intact.  Muscle strength +5/5 in bilateral upper and lower extremities.  Sensation is grossly intact.  No focal deficit.    Lab results:  Sodium (NA)                              137               135-145          mEq/L  Potassium (K)                            4.0               3.5-5.1          mEq/L  Chloride                                 105               96-112           mEq/L  CO2                                      22                19-32            mEq/L  Glucose                                  84                70-99            mg/dL  BUN                                      18                6-23             mg/dL  Creatinine  0.84              0.50-1.35        mg/dL    **Please note change in reference range.**  GFR, Est Non African American            >60               >60              mL/min  GFR, Est African American                >60               >60              mL/min    Oversized comment, see footnote  1  Calcium                                  9.5               8.4-10.5         Mg/dL   WBC                                      7.0               4.0-10.5          K/uL  RBC                                      4.21       l      4.22-5.81        MIL/uL  Hemoglobin (HGB)                         12.8       l      13.0-17.0        g/dL  Hematocrit (HCT)                         38.3       l      39.0-52.0        %  MCV                                      91.0              78.0-100.0       fL  MCH -                                    30.4              26.0-34.0        pg  MCHC                                     33.4              30.0-36.0  g/dL  RDW                                      14.4              11.5-15.5        %  Platelet Count (PLT)                     213               150-400          K/uL  Neutrophils, %                           60                43-77            %  Lymphocytes, %                           25                12-46            %  Monocytes, %                             12                3-12             %  Eosinophils, %                           2                 0-5              %  Basophils, %                             0                 0-1              %  Neutrophils, Absolute                    4.2               1.7-7.7          K/uL  Lymphocytes, Absolute                    1.8               0.7-4.0          K/uL  Monocytes, Absolute                      0.9               0.1-1.0          K/uL  Eosinophils, Absolute                    0.1               0.0-0.7  K/uL  Basophils, Absolute                      0.0               0.0-0.1          K/uL  Imaging results:  Left tib-fib 2 view x-ray: Findings: There is evidence of previous left total knee replacement.  The total knee components visible on the prior exam have been removed since the prior radiographs.  Methylmethacrylate and spacers are in place at the knee.  There is lucency around the material in the proximal tibia.  This could be acute or chronic.  The distal tibia and fibula demonstrate no acute abnormalities.  Arthritic changes are present in  the ankle and foot.  IMPRESSION: Lucency around the methylmetacrylate in the proximal tibia and ould represent loosening or infection. Comparison with more recent prior studies would be helpful if available.  Assessment & Plan by Problem:  Cellulitis:   Fairly extensive induration and erythema are present on Mr. Buckles lateral left lower extremity; these findings are consistent with cellulitis. He is afebrile and, somewhat surprisingly, without leukocytosis. There are no palpable areas of fluctuance to suggest abscess formation, furthermore no purulence was expressed on attempted incision and drainage performed in the emergency room. There were no wound cultures obtained from the incision and drainage. Rer the emergency room PA, patient's erythema has extended despite empiric treatment with clindamycin; the patient has received a total of 3 doses at the time of assessment by the admitting team.  [facility] was contacted by the emergency room physician and was present during my interview with the patient; they do not feel any further Department is indicated at this time as his physical exam findings are consistent with cellulitis and are happy to be reconsulted if needed.  It is no evidence to suggest that Mr. Wahab is suffering from a recurrent septic left knee given his physical exam and laboratory findings. He plain film obtained of his left tib-fib reveals new since a surrounding material in the proximal tibia; I am inclined to believe this is a chronic finding however it certainly could reflect osteomyelitis..  - Will admit the patient to ring the floor for observation - Will treat empirically at this time with vancomycin.  It is possible that he has an MRSA cellulitis resistance to clindamycin.  I annticipate we will quickly be able to transition him to an oral regimen to cover empirically for MRSA as well as strep possibly send him home as early as tomorrow provided he continues to do  well. - Will obtain blood cultures although they will likely be unrevealing as he has a regular received 3 doses of clindamycin - Will check CRP and ESR. If the lucency observed and his tib-fib films reflect acute process, which is osteomyelitis, but expect elevated CRP and ESR.  These may be compared with previous CRP/ESR results obtained during and after treatment of his most recent septic left knee infection - Vicodin for pain control  Hypothyroidism - Continue Synthroid  History of PE Patient was diagnosed with an acute pulmonary embolus in April 2010. Discharge paperwork from that admission indicates that he was prescribed lifelong Coumadin. The patient is able to recall self administering Lovenox injections but does not believe he ever took coumadin for a period greater than 30 days.  There is no hypercoagulable panel in E-chart. - Consider obtaining a hypercoagulable panel to evaluate the need  for lifelong Coumadin  DVT ppx: lovenox  Code status: full    ________________ Nelda Bucks, PGY-3     ________________ Quentin Ore, PGY-1

## 2010-08-20 DIAGNOSIS — L03119 Cellulitis of unspecified part of limb: Secondary | ICD-10-CM

## 2010-08-20 DIAGNOSIS — L02419 Cutaneous abscess of limb, unspecified: Secondary | ICD-10-CM

## 2010-08-21 ENCOUNTER — Emergency Department (HOSPITAL_COMMUNITY)
Admission: EM | Admit: 2010-08-21 | Discharge: 2010-08-22 | Disposition: A | Discharge status: Home / Self Care | Payer: PRIVATE HEALTH INSURANCE | Attending: Emergency Medicine | Admitting: Emergency Medicine

## 2010-08-21 DIAGNOSIS — M79609 Pain in unspecified limb: Secondary | ICD-10-CM | POA: Insufficient documentation

## 2010-08-21 DIAGNOSIS — M7989 Other specified soft tissue disorders: Secondary | ICD-10-CM | POA: Insufficient documentation

## 2010-08-21 DIAGNOSIS — L02419 Cutaneous abscess of limb, unspecified: Secondary | ICD-10-CM | POA: Insufficient documentation

## 2010-08-21 DIAGNOSIS — Z79899 Other long term (current) drug therapy: Secondary | ICD-10-CM | POA: Insufficient documentation

## 2010-08-21 DIAGNOSIS — Z9889 Other specified postprocedural states: Secondary | ICD-10-CM | POA: Insufficient documentation

## 2010-08-21 LAB — DIFFERENTIAL
Basophils Absolute: 0 10*3/uL (ref 0.0–0.1)
Basophils Relative: 1 % (ref 0–1)
Eosinophils Relative: 3 % (ref 0–5)
Monocytes Absolute: 0.7 10*3/uL (ref 0.1–1.0)
Neutro Abs: 3 10*3/uL (ref 1.7–7.7)

## 2010-08-21 LAB — BASIC METABOLIC PANEL
CO2: 27 mEq/L (ref 19–32)
Calcium: 9.5 mg/dL (ref 8.4–10.5)
Chloride: 103 mEq/L (ref 96–112)
Glucose, Bld: 98 mg/dL (ref 70–99)
Potassium: 3.9 mEq/L (ref 3.5–5.1)
Sodium: 138 mEq/L (ref 135–145)

## 2010-08-21 LAB — CBC
Hemoglobin: 12 g/dL — ABNORMAL LOW (ref 13.0–17.0)
MCHC: 32.8 g/dL (ref 30.0–36.0)
RDW: 14.4 % (ref 11.5–15.5)
WBC: 5.7 10*3/uL (ref 4.0–10.5)

## 2010-08-25 LAB — CULTURE, BLOOD (ROUTINE X 2)
Culture  Setup Time: 201207111354
Culture: NO GROWTH

## 2010-08-31 NOTE — Discharge Summary (Addendum)
[name], [name]                 ACCOUNT NO.:  [id number]  MEDICAL RECORD NO.:  [id number]  LOCATION:  5148                         FACILITY:  [facility]  PHYSICIAN:  ROGERS, [place] M      DATE OF BIRTH:  1950/08/01  DATE OF ADMISSION:  08/18/2010 DATE OF DISCHARGE:  08/20/2010                              DISCHARGE SUMMARY   DISCHARGE DIAGNOSES: 1. Cellulitis. 2. History of pulmonary embolism/venous thromboembolism. 3. Hypothyroidism.  DISCHARGE MEDICATIONS:  NEW MEDICATION: 1. Doxycycline 100 mg by mouth every 2 hours per 12 days. 2. Vicodin 5/325 kg 1 tablet by mouth every 4 hours as needed for     pain, taken for 4 days.  He was given a total of 24 pills to last     until his point with Dr. Chaney Malling on Monday  Continue home medications; 1. Aspirin 325 two tablets by mouth every morning. 2. Levothyroxine 75 mcg 1 tablet by mouth every morning. 3. Multivitamin 1 tablet mouth daily. 4. Saw Palmetto OTC 3 capsules by mouth daily.  DISPOSITION AND FOLLOWUP:  [name] was discharged from [facility] on August 20, 2010 in stable and improved condition.  He has no fever, nausea, vomiting, chest pain, or shortness of breath.  He feels that his though leg pain in greatly improved.  If he later develops fever, nausea, vomiting, or feels like his knee becomes hot, tender or filled with fluid, he is to return to the emergency department.  He has followup appointment with his orthopedic surgeon, Dr. Rinaldo Ratel on Monday July 16 at 9:30 a.m.  At that time, we would appreciate evaluation of his knee to rule out any possible tracking or septic arthritis in the knee.  CONSULTATIONS:  None.  PROCEDURES PERFORMED:  Plain radiograph of the tibia and fibula on the left was performed on August 18, 2010.  This was significant for lucency around the methyl methacrylate in the proximal tibia which could represent loosening or infection.  There appeared to be no acute abnormalities  and atherosclerotic changes were present in the ankle and foot.  ADMISSION HISTORY:  [name] is a 60 year old gentleman with past medical history significant for total knee replacement both left and right knee.  His surgical history is complicated by multiple septic strain infection with gram-positive cocci, the most recent which in 2010 in virtue prolonged hospitalization including sepsis and endotracheal intubation in the intensive care unit.  He presented to [facility] with a 3-day history of progressive pain in his left lateral lower extremity approximately 6 inches above the lateral malleolus.  He states the pain is a constant, burning sensation that is 10/10 first it is exacerbated by physical touch while wearing his compression stockings.  He had not noticed any redness or inflammation of boils on his leg, however, he is unable to adequately visualize the area of his leg.  He denies any systemic fevers, chills, nausea, vomiting, diarrhea, shortness of breath, chest pain or any other complaints besides leg tenderness.  When he was in emergency department, an incision and drainage was performed with minimal to no drainage of pus elicited from the wound.  Given his history  he was admitted to the inpatient service for observation as well as administration of IV antibiotics for potential MRSA cellulitis.  ADMITTING PHYSICAL EXAMINATION:  VITAL SIGNS:  Temperature a 98.6, heart rate 87, blood pressure 127/86, respirations 18, and O2 sat 96% on room air. GENERAL:  No apparent distress.  Alert and oriented x3 with pleasant and cooperative to exam. HEENT:  Head was atraumatic and normocephalic.  Vision was intact. Extraocular muscles were intact.  PERRLA.  Sclerae were anicteric. Conjunctiva without pallor or injection.  Mucous membranes were moist. Oropharynx was without erythema, exudate or abnormal lesions. NECK:  Supple without masses or lymphadenopathy.  No JVD or  carotid bruits. RESPIRATION:  Lungs were clear to auscultation bilaterally with good air movement.  No wheezes, rhonchi, or rubs. CARDIOVASCULAR:  Regular rate and rhythm.  Distant heart sounds.  No murmurs, gallops, or rubs appreciated. ABDOMEN:  Protuberant, nontender, and fluid-filled.  Bowel sounds were present and normoactive.  No palpable masses. EXTREMITIES:  Warm and dry with equal pulses bilaterally with no clubbing or cyanosis was appreciated.  No edema was appreciated in lower extremities.  There is an incision approximately 1 inch in length present in midlateral lower left leg, there was surrounding erythema and induration extending approximately 9 inches circumferentially from the incision.  There is no palpable fluctuance or crepitance.  No purulent serosanguineous fluid was appreciated.  No bloody drainage was observed. The area was tender to palpation.  There was no erythema, fluid collection, or tenderness to palpation of the left knee.  The left knee was cool to touch. SKIN:  Warm and dry with normal turgor, no rashes were observed anymore other than the lower left lateral leg.  Of note, there were a number of pimples on the lower legs bilaterally that had ruptured. NEURO:  Cranial nerves II-XII are grossly intact.  Muscle strength 5/5 in bilateral upper and lower extremities.  Sensation was grossly intact. No focal deficit.  ADMISSION LABS:  Sodium 137, potassium 4.0, chloride 105, bicarbonate 22, BUN 18, creatinine 0.84, glucose was 84, calcium was 9.5,  white blood cell count was 7.0, hemoglobin was 12.8, hematocrit was 38.3, and platelets were 213.  HOSPITAL COURSE: 1. Cellulitis.  [name] cellulitis responded vigorously to     intravenous antibiotics on the morning of July 12, the area of     erythema induration had decreased by approximately 50%.  He also     noted a substantial decrease in the pain and tenderness in the     area.  On July 12, he was  transitioned to oral doxycycline for     coverage of MRSA after he had received his full course of     vancomycin for this day.  He will receive a total of 14 days     antibiotic treatment for cellulitis.  His pain was managed by     Vicodin 5/325 q.4 h. p.r.n. for pain.  He was provided enough     medication for pain control to reach his appointment with Dr.     Chaney Malling on Monday, July 16, total number dispensed 24 tablets.     Mr. Plantz sedimentation rate and CRP were not overtly elevated     from his baseline and due to the distance between the cellulitis in     the left knee.  This was likely that there was no infection or     tracking to the left knee joint.  However, given the lucency seen  on x-ray we felt that was necessary the he have to follow up with     his orthopedic surgeon. 2. History of provoked pulmonary embolism/VTE.  We continued on his     home dose of aspirin as well as low-molecular heparin while there     were no events. 3. Hyperthyroidism.  We continued Mr. Housel on his home dose of     Synthroid.  DISCHARGE VITALS:  Temperature of 98.5, blood pressure 136/86, pulse 60, respirations 18, and O2 sat 98% on room air.  DISCHARGE LABS:  ESR 32 and CRP was 2.9.    ______________________________ Quentin Ore, MD   ______________________________ Terrial Rhodes    MR/MEDQ  D:  08/20/2010  T:  08/21/2010  Job:  [id number]  cc:   Thereasa Distance A. Chaney Malling, M.D.  Electronically Signed by Eliezer Lofts M.D. on 08/31/2010 10:01:27 AM Electronically Signed by Quentin Ore MD on 09/07/2010 08:54:18 PM

## 2010-09-11 ENCOUNTER — Ambulatory Visit: Payer: PRIVATE HEALTH INSURANCE | Admitting: Infectious Disease

## 2010-09-14 ENCOUNTER — Ambulatory Visit: Payer: PRIVATE HEALTH INSURANCE | Admitting: Internal Medicine

## 2010-09-18 ENCOUNTER — Encounter: Payer: Self-pay | Admitting: Infectious Disease

## 2010-09-18 ENCOUNTER — Ambulatory Visit (INDEPENDENT_AMBULATORY_CARE_PROVIDER_SITE_OTHER): Payer: PRIVATE HEALTH INSURANCE | Admitting: Infectious Disease

## 2010-09-18 VITALS — BP 123/84 | HR 75 | Temp 97.9°F | Wt 392.8 lb

## 2010-09-18 DIAGNOSIS — A491 Streptococcal infection, unspecified site: Secondary | ICD-10-CM

## 2010-09-18 DIAGNOSIS — T847XXA Infection and inflammatory reaction due to other internal orthopedic prosthetic devices, implants and grafts, initial encounter: Secondary | ICD-10-CM

## 2010-09-18 DIAGNOSIS — L0291 Cutaneous abscess, unspecified: Secondary | ICD-10-CM

## 2010-09-18 DIAGNOSIS — L0390 Cellulitis, unspecified: Secondary | ICD-10-CM

## 2010-09-18 LAB — C-REACTIVE PROTEIN: CRP: 1.1 mg/dL — ABNORMAL HIGH (ref <0.6)

## 2010-09-18 LAB — CBC WITH DIFFERENTIAL/PLATELET
Eosinophils Absolute: 0.1 10*3/uL (ref 0.0–0.7)
Eosinophils Relative: 3 % (ref 0–5)
HCT: 38.8 % — ABNORMAL LOW (ref 39.0–52.0)
Hemoglobin: 12.3 g/dL — ABNORMAL LOW (ref 13.0–17.0)
Lymphocytes Relative: 33 % (ref 12–46)
Lymphs Abs: 1.4 10*3/uL (ref 0.7–4.0)
MCH: 29.8 pg (ref 26.0–34.0)
MCV: 93.9 fL (ref 78.0–100.0)
Monocytes Absolute: 0.5 10*3/uL (ref 0.1–1.0)
Monocytes Relative: 11 % (ref 3–12)
RBC: 4.13 MIL/uL — ABNORMAL LOW (ref 4.22–5.81)
WBC: 4.3 10*3/uL (ref 4.0–10.5)

## 2010-09-18 LAB — BASIC METABOLIC PANEL WITH GFR
CO2: 25 mEq/L (ref 19–32)
Calcium: 9.6 mg/dL (ref 8.4–10.5)
Chloride: 107 mEq/L (ref 96–112)
Creat: 1.01 mg/dL (ref 0.50–1.35)
Glucose, Bld: 83 mg/dL (ref 70–99)

## 2010-09-18 LAB — SEDIMENTATION RATE: Sed Rate: 27 mm/hr — ABNORMAL HIGH (ref 0–16)

## 2010-09-19 DIAGNOSIS — L0390 Cellulitis, unspecified: Secondary | ICD-10-CM | POA: Insufficient documentation

## 2010-09-19 NOTE — Assessment & Plan Note (Signed)
Check esr, crp cbc bmp today. Continue to observe off of antibiotics

## 2010-09-19 NOTE — Assessment & Plan Note (Signed)
resolved 

## 2010-09-19 NOTE — Assessment & Plan Note (Signed)
Was culprit organism in jiont infection

## 2010-09-19 NOTE — Progress Notes (Signed)
Subjective:    Patient ID: [name], male    DOB: Aug 06, 1950, 60 y.o.   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 Roxan Hockey and last seen by Dr. Cathie Olden in 2008. Patient had been treated for prosthetic joint infection in 2007 with 6 weeks of IV antibiotics with removal of antibiotic spacer and reimplantation of prosthesis. He was being observed off antibiotics when this April 2010 when he was admited in septic shock due once again to group C streptococci from his knee. He is sp i and d and removal of prosthetic material. He was continued on parenteral antibiotics througout most of the month of April 2010 and then discharged on an additional 21 d of rocephin 1 g daily. I put him back on oral antibiotics PCN and then amoxicillin when I first saw him because I was concerned by persistence of erythema in the leg and warmth over the joint space. He also still had slightly elevated inflammatory markers. Apparently he still has a screw that cannot be removed from the leg that could be a nidus of infection. He has remained off of antibiotics for many months now. He is still with pain in the knee more so when he bears weight and at night. He was admitted with apparent celluliits oft he involved leg that was treated as an inpatient. He states that the joint itself was never involved. Review of Systems  Constitutional: Negative for fever, chills, diaphoresis, activity change, appetite change, fatigue and unexpected weight change.  HENT: Negative for congestion, sore throat, rhinorrhea, sneezing, trouble swallowing and sinus pressure.   Eyes: Negative for photophobia and visual disturbance.  Respiratory: Negative for cough, chest tightness, shortness of breath, wheezing and stridor.   Cardiovascular: Negative for chest pain, palpitations and leg swelling.  Gastrointestinal: Negative for nausea, vomiting, abdominal pain,  diarrhea, constipation, blood in stool, abdominal distention and anal bleeding.  Genitourinary: Negative for dysuria, hematuria, flank pain and difficulty urinating.  Musculoskeletal: Negative for myalgias, back pain, joint swelling, arthralgias and gait problem.  Skin: Positive for color change. Negative for pallor, rash and wound.  Neurological: Negative for dizziness, tremors, weakness and light-headedness.  Hematological: Negative for adenopathy. Does not bruise/bleed easily.  Psychiatric/Behavioral: Negative for behavioral problems, confusion, sleep disturbance, dysphoric mood, decreased concentration and agitation.       Objective:   Physical Exam  Constitutional: He is oriented to person, place, and time. He appears well-developed and well-nourished. No distress.  HENT:  Head: Normocephalic and atraumatic.  Mouth/Throat: Oropharynx is clear and moist. No oropharyngeal exudate.  Eyes: Conjunctivae and EOM are normal. Pupils are equal, round, and reactive to light. No scleral icterus.  Neck: Normal range of motion. Neck supple. No JVD present.  Cardiovascular: Normal rate, regular rhythm and normal heart sounds.  Exam reveals no gallop and no friction rub.   No murmur heard. Pulmonary/Chest: Effort normal and breath sounds normal. No respiratory distress. He has no wheezes. He has no rales. He exhibits no tenderness.  Abdominal: He exhibits no distension and no mass. There is no tenderness. There is no rebound and no guarding.  Musculoskeletal: He exhibits edema. He exhibits no tenderness.  Lymphadenopathy:    He has no cervical adenopathy.  Neurological: He is alert and oriented to person, place, and time. He has normal reflexes. He exhibits normal muscle tone. Coordination normal.  Skin: Skin is warm and dry. He is not diaphoretic. No  pallor.          Knee without warmth. He does have bilateral edema in legs. The cellullitis has resolved in left leg  Psychiatric: He has a normal  mood and affect. His behavior is normal. Judgment and thought content normal.          Assessment & Plan:

## 2010-10-01 NOTE — Op Note (Signed)
[name], [name]                 ACCOUNT NO.:  [id number]  MEDICAL RECORD NO.:  [id number]          PATIENT TYPE:  OIB  LOCATION:  1413                         FACILITY:  [facility]  PHYSICIAN:  Sela Hua, MD   DATE OF BIRTH:  [birth year]  DATE OF PROCEDURE:  08/26/2009 DATE OF DISCHARGE:                              OPERATIVE REPORT  PREOPERATIVE DIAGNOSIS:  Bilateral hydroceles.  POSTOPERATIVE DIAGNOSIS:  Multiseptated bilateral hydroceles.  PROCEDURE PERFORMED:  Bilateral hydrocelectomy.  ANESTHESIA:  General endotracheal anesthesia.  FLUIDS:  Per anesthesia record.  ESTIMATED BLOOD LOSS:  None.  SPECIMENS:  None.  COMPLICATIONS:  None.  CONDITION:  Stable to PACU.  INDICATIONS FOR PROCEDURE:  This is a 60 year old gentleman who came in to see Dr. Brunilda Payor.  He had previously been seen and evaluated for bilateral hydroceles, but declined any intervention as they were asymptomatic.  They become bothersome as of late and interfering with his activities of daily living, and therefore, he presented for operative intervention.  DESCRIPTION OF PROCEDURE:  Informed consent was obtained.  The patient was brought to the operating room where a time-out was performed, identifying correct patient, personnel present, as well as procedure being performed.  General endotracheal anesthesia as well as preoperative antibiotics were administered.  He was placed in the supine position.  He was prepped and draped in the normal sterile fashion. Exam under anesthesia revealed bilateral hydroceles of roughly baseball size.  Injection of 0.25% Marcaine was accomplished on the lateral aspect of the left scrotal wall.  Incision of approximately 5 cm was made vertical in fashion on the left lateral hemiscrotum.  Dissection was taken down to the level of the hydrocele sac using Bovie cautery as well as sharp dissection.  Hemostasis was assured throughout with Bovie cautery.  Once the hydrocele  sac was identified, it was entered sharply, the fluid contents were evacuated, the sac was further opened sharply. After hemostasis was assured and all the fluid was evacuated, sutures were placed in an imbricated circumferential fashion leaving the testicle exposed on the hydrocele sac.  Once this was performed and satisfactorily accomplished, the left testicle was returned to its anatomic position in the left hemiscrotum.  The scrotum was closed in 2 interrupted layers, 1 deep, 1 at the skin level.  After this was accomplished, the attention was paid to the right hydrocele and local anesthetic was injected at the skin level on the right hemiscrotum and a roughly 5-cm vertical incision was undertaken.  Dissection was taken down to the hydrocele sac using sharp dissection as well as Bovie cautery.  Once the hydrocele sac was identified, it was entered sharply and the contents were evacuated.  The hydrocele sac was further opened and hemostasis was achieved with Bovie cautery.  Interrupted imbricated sutures packing the hydrocele sac down to the testicle, leaving the testicle exposed was undertaken using chromic sutures.  Once hydrocele sac was fully imbricated and the hemostasis was assured, the right testicle was returned back to the right hemiscrotum in its anatomic position.  The deep tissue layer as well as the skin were closed with separate running chromic  sutures.  Dermabond was applied, and this concluded our procedure.  The patient was awoken from anesthesia and transferred to the PACU in stable condition.  There were no complications.  Please note, Dr. Brunilda Payor was present for and participated in all portions of the operation.          ______________________________ Sela Hua, MD    CK/MEDQ  D:  08/26/2009  T:  08/27/2009  Job:  [id number]  Electronically Signed by Lindaann Slough M.D. on 10/01/2010 05:04:47 PM

## 2011-03-22 ENCOUNTER — Ambulatory Visit (INDEPENDENT_AMBULATORY_CARE_PROVIDER_SITE_OTHER): Payer: PRIVATE HEALTH INSURANCE | Admitting: Infectious Disease

## 2011-03-22 ENCOUNTER — Encounter: Payer: Self-pay | Admitting: Infectious Disease

## 2011-03-22 VITALS — BP 136/82 | HR 103 | Temp 97.9°F | Wt 391.0 lb

## 2011-03-22 DIAGNOSIS — M009 Pyogenic arthritis, unspecified: Secondary | ICD-10-CM | POA: Insufficient documentation

## 2011-03-22 DIAGNOSIS — A4902 Methicillin resistant Staphylococcus aureus infection, unspecified site: Secondary | ICD-10-CM

## 2011-03-22 DIAGNOSIS — T847XXA Infection and inflammatory reaction due to other internal orthopedic prosthetic devices, implants and grafts, initial encounter: Secondary | ICD-10-CM

## 2011-03-22 LAB — SEDIMENTATION RATE: Sed Rate: 17 mm/hr — ABNORMAL HIGH (ref 0–16)

## 2011-03-22 LAB — BASIC METABOLIC PANEL WITH GFR
CO2: 27 mEq/L (ref 19–32)
Calcium: 10 mg/dL (ref 8.4–10.5)
GFR, Est African American: >89 mL/min
Glucose, Bld: 112 mg/dL — ABNORMAL HIGH (ref 70–99)
Potassium: 5 mEq/L (ref 3.5–5.3)
Sodium: 141 mEq/L (ref 135–145)

## 2011-03-22 LAB — CBC WITH DIFFERENTIAL/PLATELET
Hemoglobin: 12.9 g/dL — ABNORMAL LOW (ref 13.0–17.0)
Lymphocytes Relative: 28 % (ref 12–46)
Lymphs Abs: 1.2 10*3/uL (ref 0.7–4.0)
MCH: 29.4 pg (ref 26.0–34.0)
Monocytes Relative: 6 % (ref 3–12)
Neutrophils Relative %: 61 % (ref 43–77)
Platelets: 230 10*3/uL (ref 150–400)
RBC: 4.39 MIL/uL (ref 4.22–5.81)
WBC: 4.3 10*3/uL (ref 4.0–10.5)

## 2011-03-22 MED ORDER — MUPIROCIN 2 % EX OINT: TOPICAL_OINTMENT | Freq: Two times a day (BID) | CUTANEOUS | Status: DC

## 2011-03-22 MED ORDER — CHLORHEXIDINE GLUCONATE 4 % EX LIQD: 60.0000 mL | CUTANEOUS | Status: DC

## 2011-03-22 NOTE — Assessment & Plan Note (Signed)
Try another decolonization regimen for him

## 2011-03-22 NOTE — Progress Notes (Signed)
Subjective:    Patient ID: [name], male    DOB: 1951-01-19, 61 y.o.   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 Roxan Hockey and last seen by Dr. Cathie Olden in 2008. Patient had been treated for prosthetic joint infection in 2007 with 6 weeks of IV antibiotics with removal of antibiotic spacer and reimplantation of prosthesis. He was being observed off antibiotics when this April 2010 when he was admited in septic shock due once again to group C streptococci from his knee. He is sp i and d and removal of prosthetic material. He was continued on parenteral antibiotics througout most of the month of April 2010 and then discharged on an additional 21 d of rocephin 1 g daily. I put him back on oral antibiotics PCN and then amoxicillin when I first saw him because I was concerned by persistence of erythema in the leg and warmth over the joint space. He also still had slightly elevated inflammatory markers. Apparently he still has a screw that cannot be removed from the leg that could be a nidus of infection.   He has remained off of antibiotics for more than a year outside of treatment for what sounds like a MRSA furuncle on his left lower leg.  He is still with pain in the knee more so when he bears weight and at night.  He states that the joint itself was never involved.   He continues to have continuous pain in left joint, worsened by changes in the weather and by bearing weight> he is without fevers, chills nausea or malaise   Review of Systems  Constitutional: Negative for fever, chills, diaphoresis, activity change, appetite change, fatigue and unexpected weight change.  HENT: Negative for congestion, sore throat, rhinorrhea, sneezing, trouble swallowing and sinus pressure.   Eyes: Negative for photophobia and visual disturbance.  Respiratory: Negative for cough, chest tightness, shortness of breath, wheezing and  stridor.   Cardiovascular: Negative for chest pain, palpitations and leg swelling.  Gastrointestinal: Negative for nausea, vomiting, abdominal pain, diarrhea, constipation, blood in stool, abdominal distention and anal bleeding.  Genitourinary: Negative for dysuria, hematuria, flank pain and difficulty urinating.  Musculoskeletal: Positive for arthralgias and gait problem. Negative for myalgias, back pain and joint swelling.  Skin: Negative for color change, pallor, rash and wound.  Neurological: Negative for dizziness, tremors, weakness and light-headedness.  Hematological: Negative for adenopathy. Does not bruise/bleed easily.  Psychiatric/Behavioral: Negative for behavioral problems, confusion, sleep disturbance, dysphoric mood, decreased concentration and agitation.       Objective:   Physical Exam  Constitutional: He is oriented to person, place, and time. He appears well-developed and well-nourished. No distress.  HENT:  Head: Normocephalic and atraumatic.  Mouth/Throat: Oropharynx is clear and moist. No oropharyngeal exudate.  Eyes: Conjunctivae and EOM are normal. Pupils are equal, round, and reactive to light. No scleral icterus.  Neck: Normal range of motion. Neck supple. No JVD present.  Cardiovascular: Normal rate, regular rhythm and normal heart sounds.  Exam reveals no gallop and no friction rub.   No murmur heard. Pulmonary/Chest: Effort normal and breath sounds normal. No respiratory distress. He has no wheezes. He has no rales. He exhibits no tenderness.  Abdominal: He exhibits no distension and no mass. There is no tenderness. There is no rebound and no guarding.  Musculoskeletal: He exhibits no edema and no tenderness.       Legs: Lymphadenopathy:  He has no cervical adenopathy.  Neurological: He is alert and oriented to person, place, and time. He has normal reflexes. He exhibits normal muscle tone. Coordination normal.  Skin: Skin is warm and dry. He is not  diaphoretic. No erythema. No pallor.  Psychiatric: He has a normal mood and affect. His behavior is normal. Judgment and thought content normal.          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 Check esr,crp   METHICILLIN RESISTANT STAPHYLOCOCCUS AUREUS INFECTION Try another decolonization regimen for him

## 2011-03-22 NOTE — Assessment & Plan Note (Signed)
Continue to observe off antibiotics. Check esr,crp

## 2011-10-19 ENCOUNTER — Ambulatory Visit (INDEPENDENT_AMBULATORY_CARE_PROVIDER_SITE_OTHER): Payer: Worker's Compensation | Admitting: Infectious Disease

## 2011-10-19 ENCOUNTER — Encounter: Payer: Self-pay | Admitting: Infectious Disease

## 2011-10-19 VITALS — BP 121/92 | HR 92 | Temp 97.9°F | Wt 396.0 lb

## 2011-10-19 DIAGNOSIS — M009 Pyogenic arthritis, unspecified: Secondary | ICD-10-CM

## 2011-10-19 DIAGNOSIS — A491 Streptococcal infection, unspecified site: Secondary | ICD-10-CM

## 2011-10-19 LAB — CBC WITH DIFFERENTIAL/PLATELET
Basophils Relative: 1 % (ref 0–1)
HCT: 40.5 % (ref 39.0–52.0)
Hemoglobin: 13.6 g/dL (ref 13.0–17.0)
Lymphocytes Relative: 30 % (ref 12–46)
Lymphs Abs: 1.6 10*3/uL (ref 0.7–4.0)
MCHC: 33.6 g/dL (ref 30.0–36.0)
Monocytes Relative: 10 % (ref 3–12)
Neutro Abs: 2.9 10*3/uL (ref 1.7–7.7)
Neutrophils Relative %: 56 % (ref 43–77)
RBC: 4.52 MIL/uL (ref 4.22–5.81)
WBC: 5.1 10*3/uL (ref 4.0–10.5)

## 2011-10-19 NOTE — Assessment & Plan Note (Signed)
The patient has done well off antibiotics we'll recheck inflammatory markers.  And encouraged him to visit with Dr. Charlann Boxer but again I cannot guarantee that he will not have recurrence of infection. Again the culprit organism appears to been a group C streptococcus.

## 2011-10-19 NOTE — Assessment & Plan Note (Signed)
See above

## 2011-10-19 NOTE — Progress Notes (Signed)
Subjective:    Patient ID: [name], male    DOB: 1950-02-10, 61 y.o.   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 Roxan Hockey and last seen by Dr. Cathie Olden in 2008. Patient had been treated for prosthetic joint infection in 2007 with 6 weeks of IV antibiotics with removal of antibiotic spacer and reimplantation of prosthesis. He was being observed off antibiotics when this April 2010 when he was admited in septic shock due once again to group C streptococci from his knee. He is sp i and d and removal of prosthetic material. He was continued on parenteral antibiotics througout most of the month of April 2010 and then discharged on an additional 21 d of rocephin 1 g daily. I put him back on oral antibiotics PCN and then amoxicillin when I first saw him because I was concerned by persistence of erythema in the leg and warmth over the joint space. He also still had slightly elevated inflammatory markers. Apparently he still has a screw that cannot be removed from the leg that could be a nidus of infection.  He has remained off of antibiotics for more than a year outside of treatment for what sounds like a MRSA furuncle on his left lower leg. He is still with pain in the knee more so when he bears weight and at night. He states that the joint itself was never involved.  He continues to have continuous pain in left joint, worsened by changes in the weather and by bearing weight, she believes is due to the antibiotic spacer.> he is without fevers, chills nausea or malaise. He states that Dr. Chaney Malling is referring him to Dr. Charlann Boxer for consideration of joint replacement once again. He asked me for my opinion on the matter and I told certainly this could be considered but he needed to keep in mind his history of recurrent infection and that I could not guarantee the might not have recurrence of his infection in the new  joint.   Review of Systems  Constitutional: Negative for fever, chills, diaphoresis, activity change, appetite change, fatigue and unexpected weight change.  HENT: Negative for congestion, sore throat, rhinorrhea, sneezing, trouble swallowing and sinus pressure.   Eyes: Negative for photophobia and visual disturbance.  Respiratory: Negative for cough, chest tightness, shortness of breath, wheezing and stridor.   Cardiovascular: Negative for chest pain, palpitations and leg swelling.  Gastrointestinal: Negative for nausea, vomiting, abdominal pain, diarrhea, constipation, blood in stool, abdominal distention and anal bleeding.  Genitourinary: Negative for dysuria, hematuria, flank pain and difficulty urinating.  Musculoskeletal: Positive for arthralgias and gait problem. Negative for myalgias, back pain and joint swelling.  Skin: Negative for color change, pallor, rash and wound.  Neurological: Negative for dizziness, tremors, weakness and light-headedness.  Hematological: Negative for adenopathy. Does not bruise/bleed easily.  Psychiatric/Behavioral: Negative for behavioral problems, confusion, disturbed wake/sleep cycle, dysphoric mood, decreased concentration and agitation.       Objective:   Physical Exam  Constitutional: He is oriented to person, place, and time. He appears well-developed and well-nourished. No distress.  HENT:  Head: Normocephalic and atraumatic.  Mouth/Throat: Oropharynx is clear and moist. No oropharyngeal exudate.  Eyes: Conjunctivae and EOM are normal. Pupils are equal, round, and reactive to light. No scleral icterus.  Neck: Normal range of motion. Neck supple. No JVD present.  Cardiovascular: Normal rate, regular rhythm and normal heart sounds.  Exam reveals no  gallop and no friction rub.   No murmur heard. Pulmonary/Chest: Effort normal and breath sounds normal. No respiratory distress. He has no wheezes. He has no rales. He exhibits no tenderness.   Abdominal: He exhibits no distension and no mass. There is no tenderness. There is no rebound and no guarding.  Musculoskeletal: He exhibits no edema and no tenderness.       Left knee: He exhibits no swelling and no effusion. no tenderness found.       Legs: Lymphadenopathy:    He has no cervical adenopathy.  Neurological: He is alert and oriented to person, place, and time. He has normal reflexes. He exhibits normal muscle tone. Coordination normal.  Skin: Skin is warm and dry. He is not diaphoretic. No erythema. No pallor.  Psychiatric: He has a normal mood and affect. His behavior is normal. Judgment and thought content normal.          Assessment & Plan:  Septic arthritis of knee  The patient has done well off antibiotics we'll recheck inflammatory markers.  And encouraged him to visit with Dr. Charlann Boxer but again I cannot guarantee that he will not have recurrence of infection. Again the culprit organism appears to been a group C streptococcus.  STREPTOCOCCUS INFECTION CCE & UNS SITE GROUP C See above

## 2011-10-20 LAB — BASIC METABOLIC PANEL WITH GFR
Chloride: 106 mEq/L (ref 96–112)
GFR, Est African American: 61 mL/min
GFR, Est Non African American: 53 mL/min — ABNORMAL LOW
Potassium: 4.6 mEq/L (ref 3.5–5.3)
Sodium: 140 mEq/L (ref 135–145)

## 2011-10-20 LAB — C-REACTIVE PROTEIN: CRP: 0.6 mg/dL — ABNORMAL HIGH (ref <0.60)

## 2011-10-20 LAB — SEDIMENTATION RATE: Sed Rate: 12 mm/hr (ref 0–16)

## 2012-04-17 ENCOUNTER — Ambulatory Visit: Payer: Worker's Compensation | Admitting: Infectious Disease

## 2012-04-20 ENCOUNTER — Ambulatory Visit (INDEPENDENT_AMBULATORY_CARE_PROVIDER_SITE_OTHER): Payer: Worker's Compensation | Admitting: Infectious Disease

## 2012-04-20 ENCOUNTER — Encounter: Payer: Self-pay | Admitting: Infectious Disease

## 2012-04-20 VITALS — BP 145/85 | HR 102 | Temp 97.6°F | Wt 393.8 lb

## 2012-04-20 DIAGNOSIS — M009 Pyogenic arthritis, unspecified: Secondary | ICD-10-CM

## 2012-04-20 DIAGNOSIS — Z5189 Encounter for other specified aftercare: Secondary | ICD-10-CM

## 2012-04-20 DIAGNOSIS — A491 Streptococcal infection, unspecified site: Secondary | ICD-10-CM

## 2012-04-20 DIAGNOSIS — T8454XD Infection and inflammatory reaction due to internal left knee prosthesis, subsequent encounter: Secondary | ICD-10-CM

## 2012-04-20 LAB — C-REACTIVE PROTEIN: CRP: 1.2 mg/dL — ABNORMAL HIGH (ref <0.60)

## 2012-04-20 LAB — SEDIMENTATION RATE: Sed Rate: 18 mm/hr — ABNORMAL HIGH (ref 0–16)

## 2012-04-20 NOTE — Progress Notes (Signed)
Subjective:    Patient ID: [name], male    DOB: 1950-03-21, 62 y.o.   MRN: [id number]  HPI  Subjective:    Patient ID: [name], male    DOB: 04-14-1950, 62 y.o.   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 Larinda Buttery  by Dr. Cathie Olden in 2008. Patient had been treated for prosthetic joint infection in 2007 with 6 weeks of IV antibiotics with removal of antibiotic spacer and reimplantation of prosthesis.   He was being observed off antibiotics when this April 2010 when he was admited in septic shock due once again to group C streptococci from his knee. He was  sp i and d and removal of prosthetic material. He was continued on parenteral antibiotics througout most of the month of April 2010 and then discharged on an additional 21 d of rocephin 1 g daily  . I put him back on oral antibiotics PCN and then amoxicillin when I first saw him because I was concerned by persistence of erythema in the leg and warmth over the joint space. He also still had slightly elevated inflammatory markers.   Apparently he still had a screw that cannot be removed from the leg that was a concern for a nidus of infection.   After I saw him in September of 2010 he actually came off of abx on his own, and I again saw him in August of 2011 and have been following him OFF  antibiotics since.  He still has significant knee pain  But more whent he weather is changing or he is exerting himself. His recent inflammatory markers have all been normal since 2013. Last slightly high at 27 and 32 for ESR in August, July of 2012.    He is being referred to Dr. Linna Darner in [place] for consideration of new TKA.  I will forward notes and labs to Dr Linna Darner.  Again I reinforced to him that his now 3+ years OFF abx withotu flares and recent joint aspiration by Dr Charlann Boxer with normal wbc and negative culture were all very encouraging but that  we could not not guarantee the might not have recurrence of his infection in the new joint esp given his complicated hx  Dr. Linna Darner       Assessment & Plan:  Septic arthritis of knee  The patient has done well off antibiotics we'll recheck inflammatory markers.  And encouraged him to visit with Dr. Charlann Boxer but again I cannot guarantee that he will not have recurrence of infection. Again the culprit organism appears to been a group C streptococcus.  STREPTOCOCCUS INFECTION CCE & UNS SITE GROUP C See above    Dr. Linna Darner  Review of Systems  Constitutional: Negative for fever, chills, diaphoresis, activity change, appetite change, fatigue and unexpected weight change.  HENT: Negative for congestion, sore throat, rhinorrhea, sneezing, trouble swallowing and sinus pressure.   Eyes: Negative for photophobia and visual disturbance.  Respiratory: Negative for cough, chest tightness, shortness of breath, wheezing and stridor.   Cardiovascular: Negative for chest pain, palpitations and leg swelling.  Gastrointestinal: Negative for nausea, vomiting, abdominal pain, diarrhea, constipation, blood in stool, abdominal distention and anal bleeding.  Genitourinary: Negative for dysuria, hematuria, flank pain and difficulty urinating.  Musculoskeletal: Positive for joint swelling and gait problem. Negative for myalgias, back pain and arthralgias.  Skin: Negative for color change, pallor, rash and wound.  Neurological: Negative for  dizziness, tremors, weakness and light-headedness.  Hematological: Negative for adenopathy. Does not bruise/bleed easily.  Psychiatric/Behavioral: Negative for behavioral problems, confusion, sleep disturbance, dysphoric mood, decreased concentration and agitation.       Objective:   Physical Exam  Constitutional: He is oriented to person, place, and time. He appears well-developed and well-nourished. No distress.  HENT:  Head: Normocephalic and atraumatic.    Mouth/Throat: Oropharynx is clear and moist. No oropharyngeal exudate.  Eyes: Conjunctivae and EOM are normal. Pupils are equal, round, and reactive to light. No scleral icterus.  Neck: Normal range of motion. Neck supple. No JVD present.  Cardiovascular: Normal rate, regular rhythm and normal heart sounds.  Exam reveals no gallop and no friction rub.   No murmur heard. Pulmonary/Chest: Effort normal and breath sounds normal. No respiratory distress. He has no wheezes. He has no rales. He exhibits no tenderness.  Abdominal: He exhibits no distension and no mass. There is no tenderness. There is no rebound and no guarding.  Musculoskeletal: He exhibits no edema and no tenderness.       Left knee: He exhibits no swelling and no effusion. No tenderness found.       Legs: Lymphadenopathy:    He has no cervical adenopathy.  Neurological: He is alert and oriented to person, place, and time. He has normal reflexes. He exhibits normal muscle tone. Coordination normal.  Skin: Skin is warm and dry. He is not diaphoretic. No erythema. No pallor.  Psychiatric: He has a normal mood and affect. His behavior is normal. Judgment and thought content normal.          Assessment & Plan:  Septic arthritis of knee  The patient has done well off antibiotics  > 3.5 YEARSs. we'll recheck inflammatory markers.  STREPTOCOCCUS INFECTION CCE & UNS SITE GROUP C  Was culprit organism

## 2012-05-24 ENCOUNTER — Ambulatory Visit: Payer: Self-pay | Admitting: Infectious Disease

## 2012-06-12 ENCOUNTER — Encounter (HOSPITAL_COMMUNITY): Payer: Self-pay | Admitting: Pharmacy Technician

## 2012-06-14 ENCOUNTER — Encounter (HOSPITAL_COMMUNITY): Payer: Self-pay

## 2012-06-14 ENCOUNTER — Ambulatory Visit (HOSPITAL_COMMUNITY)
Admission: RE | Admit: 2012-06-14 | Discharge: 2012-06-14 | Disposition: A | Discharge status: Home / Self Care | Payer: Worker's Compensation | Source: Ambulatory Visit | Attending: Orthopedic Surgery | Admitting: Orthopedic Surgery

## 2012-06-14 ENCOUNTER — Encounter (HOSPITAL_COMMUNITY)
Admission: RE | Admit: 2012-06-14 | Discharge: 2012-06-14 | Disposition: A | Discharge status: Home / Self Care | Payer: Worker's Compensation | Source: Ambulatory Visit | Attending: Orthopedic Surgery | Admitting: Orthopedic Surgery

## 2012-06-14 DIAGNOSIS — Z01818 Encounter for other preprocedural examination: Secondary | ICD-10-CM | POA: Insufficient documentation

## 2012-06-14 HISTORY — DX: Pain, unspecified: R52

## 2012-06-14 HISTORY — DX: Shortness of breath: R06.02

## 2012-06-14 HISTORY — DX: Hypothyroidism, unspecified: E03.9

## 2012-06-14 HISTORY — DX: Adverse effect of unspecified anesthetic, initial encounter: T41.45XA

## 2012-06-14 HISTORY — DX: Other complications of anesthesia, initial encounter: T88.59XA

## 2012-06-14 LAB — URINALYSIS, ROUTINE W REFLEX MICROSCOPIC
Glucose, UA: NEGATIVE mg/dL
Hgb urine dipstick: NEGATIVE
Ketones, ur: NEGATIVE mg/dL
Protein, ur: NEGATIVE mg/dL
Urobilinogen, UA: 0.2 mg/dL (ref 0.0–1.0)

## 2012-06-14 LAB — PROTIME-INR: INR: 0.96 (ref 0.00–1.49)

## 2012-06-14 LAB — APTT: aPTT: 32 seconds (ref 24–37)

## 2012-06-14 NOTE — Patient Instructions (Signed)
YOUR SURGERY IS SCHEDULED AT [facility]  ON:  Friday  5/16  REPORT TO [facility] AT:  10:30      PHONE # FOR SHORT STAY IS [phone]  DO NOT EAT ANYTHING AFTER MIDNIGHT THE NIGHT BEFORE YOUR SURGERY.   NO FOOD, NO CHEWING GUM, NO MINTS, NO CANDIES, NO CHEWING TOBACCO.  YOU MAY HAVE CLEAR LIQUIDS TO DRINK FROM MIDNIGHT THE NIGHT BEFORE SURGERY UNTIL 7:00 AM  - LIKE WATER, COFFEE - NO MILK OR MILK PRODUCTS.   NOTHING TO DRINK AFTER 7:00 AM DAY OF YOUR SURGERY.  PLEASE TAKE THE FOLLOWING MEDICATIONS THE AM OF YOUR SURGERY WITH A FEW SIPS OF WATER:  LEVOTHYROXINE, OXYCODONE / ACETAMINOPHEN FOR PAIN IF NEEDED.    DO NOT BRING VALUABLES, MONEY, CREDIT CARDS.  DO NOT WEAR JEWELRY, MAKE-UP, NAIL POLISH AND NO METAL PINS OR CLIPS IN YOUR HAIR. CONTACT LENS, DENTURES / PARTIALS, GLASSES SHOULD NOT BE WORN TO SURGERY AND IN MOST CASES-HEARING AIDS WILL NEED TO BE REMOVED.  BRING YOUR GLASSES CASE, ANY EQUIPMENT NEEDED FOR YOUR CONTACT LENS. FOR PATIENTS ADMITTED TO THE HOSPITAL--CHECK OUT TIME THE DAY OF DISCHARGE IS 11:00 AM.  ALL INPATIENT ROOMS ARE PRIVATE - WITH BATHROOM, TELEPHONE, TELEVISION AND WIFI INTERNET.                               PLEASE READ OVER ANY  FACT SHEETS THAT YOU WERE GIVEN: MRSA INFORMATION, BLOOD TRANSFUSION INFORMATION, INCENTIVE SPIROMETER INFORMATION. FAILURE TO FOLLOW THESE INSTRUCTIONS MAY RESULT IN THE CANCELLATION OF YOUR SURGERY.   PATIENT SIGNATURE_________________________________

## 2012-06-14 NOTE — Pre-Procedure Instructions (Signed)
CHRISTINA WITH PORTABLE EQUIPMENT NOTIFIED PT WILL NEED BARI BED - WT [id number] - SURGERY DATE IS Friday 5/16 AT 1:00 PM AND ORDER IS IN EPIC PT'S EKG AND CBC, DIFF, AND CMET REPORTS ON CHART - DONE 06/12/12 AND FAXED BY DR. Bonney Leitz OFFICE.   PT, PTT, URINALYSIS, T/S AND CXR WERE DONE TODAY -PREOP - AT [facility] AS PER ORDERS DR. OLIN AND ANESTHESIOLOGIST'S GUIDELINES.

## 2012-06-14 NOTE — Progress Notes (Signed)
06/14/12 1330  OBSTRUCTIVE SLEEP APNEA  Have you ever been diagnosed with sleep apnea through a sleep study? No  Do you snore loudly (loud enough to be heard through closed doors)?  0  Do you often feel tired, fatigued, or sleepy during the daytime? 1  Has anyone observed you stop breathing during your sleep? 0  Do you have, or are you being treated for high blood pressure? 0  BMI more than 35 kg/m2? 1  Age over 61 years old? 1  Neck circumference greater than 40 cm/18 inches? 1  Gender: 1  Obstructive Sleep Apnea Score 5  Score 4 or greater  Results sent to PCP

## 2012-06-14 NOTE — H&P (Signed)
[name] is an 62 y.o. male.    Procedure:    Removal of antibiotic spacer and reimplantation of total knee arthroplasty  Chief Complaint:    Left knee previous infection with removal of prothesis and implantation of antibiotic spacer, multiple times.   HPI: Pt is a 62 y.o. male complaining of knee pain for many years. He states he has had at least 3 total knee arthroplasties which have been removed with implantation of antibiotic spacers by Dr. Chaney Malling.  He has had the last antibiotic spacer in the knee since 2010.  It was doing well, but now he has times of significant pain, states it feels as if there is something floating around in the knee.   X-rays in the clinic show deteriorated bone with presumed antibiotic spacer.   Pt has tried various conservative treatments which have failed to alleviate their symptoms. Various options are discussed with the patient.  No guarantees are made, presumed or expressed. Risks, benefits and expectations were discussed with the patient. Patient understand the risks, benefits and expectations and wishes to proceed with surgery.   PCP:  Garrett Dials, MD  D/C Plans:    Home with HHPT/SNF  Post-op Meds:  No Rx given   Tranexamic Acid:   Not to be given - Previous PE  Decadron:   To be given  FYI:  Nothing to note  PMH: Past Medical History  Diagnosis Date  . Complication of anesthesia     PT STATES HARD TO WAKE UP AFTER ONE SUGERY -STATES THE SURGERY TOOK LONGER THAN EXPECTED.  NO PROBLEMS WITH ANY OTHER SURGERY  . Hypothyroidism   . Shortness of breath     WITH EXERTION AND PAIN  . Septic arthritis of knee     LEFT KNEE  . Pain     BACK PAIN - PT ATTRIBUTES TO THE WAY HE WALKS DUE TO LEFT KNEE PROBLEM    PSH: Past Surgical History  Procedure Laterality Date  . Right knee arthroscopy  1998  . Left knee arthroscopy  1999  . Left knee surgery upper tibial ostomy    . Replacement left knee  2002  . Replacement right knee  2003  .  Revision left knee cap   2004  . Left total knee removal for infection  2006  . Reimplantation left total knee   2006  . Removal left total knee   2008  . Reimplantation left total knee  2008  . 2010 removal of left total knee    . Hydrocelectoy   2012    Social History:  reports that he has never smoked. He has never used smokeless tobacco. He reports that  drinks alcohol. He reports that he does not use illicit drugs.  Allergies:  Allergies  Allergen Reactions  . Morphine     HIVES    Medications: No current facility-administered medications for this encounter.   Current Outpatient Prescriptions  Medication Sig Dispense Refill  . aspirin EC 81 MG tablet Take 81 mg by mouth daily.      Marland Kitchen bismuth subsalicylate (PEPTO BISMOL) 262 MG/15ML suspension Take 15 mLs by mouth every 6 (six) hours as needed (diarrhea).      . diphenhydrAMINE (BENADRYL) 25 mg capsule Take 25 mg by mouth daily as needed for allergies.      Marland Kitchen levothyroxine (SYNTHROID, LEVOTHROID) 100 MCG tablet Take 100 mcg by mouth daily before breakfast.      . Multiple Vitamin (MULTIVITAMIN WITH MINERALS)  TABS Take 1 tablet by mouth daily.      Marland Kitchen oxyCODONE-acetaminophen (PERCOCET) 7.5-325 MG per tablet Take 1 tablet by mouth every 4 (four) hours as needed for pain (PT RARELY TAKES).      . Saw Palmetto, Serenoa repens, 320 MG CAPS Take 320 mg by mouth 2 (two) times daily.        Results for orders placed during the hospital encounter of 06/14/12 (from the past 48 hour(s))  URINALYSIS, ROUTINE W REFLEX MICROSCOPIC     Status: None   Collection Time    06/14/12 12:35 PM      Result Value Range   Color, Urine YELLOW  YELLOW   APPearance CLEAR  CLEAR   Specific Gravity, Urine 1.022  1.005 - 1.030   pH 5.5  5.0 - 8.0   Glucose, UA NEGATIVE  NEGATIVE mg/dL   Hgb urine dipstick NEGATIVE  NEGATIVE   Bilirubin Urine NEGATIVE  NEGATIVE   Ketones, ur NEGATIVE  NEGATIVE mg/dL   Protein, ur NEGATIVE  NEGATIVE mg/dL    Urobilinogen, UA 0.2  0.0 - 1.0 mg/dL   Nitrite NEGATIVE  NEGATIVE   Leukocytes, UA NEGATIVE  NEGATIVE   Comment: MICROSCOPIC NOT DONE ON URINES WITH NEGATIVE PROTEIN, BLOOD, LEUKOCYTES, NITRITE, OR GLUCOSE <1000 mg/dL.  TYPE AND SCREEN     Status: None   Collection Time    06/14/12  1:00 PM      Result Value Range   ABO/RH(D) O POS     Antibody Screen NEG     Sample Expiration 06/28/2012    APTT     Status: None   Collection Time    06/14/12  1:35 PM      Result Value Range   aPTT 32  24 - 37 seconds  PROTIME-INR     Status: None   Collection Time    06/14/12  1:35 PM      Result Value Range   Prothrombin Time 12.7  11.6 - 15.2 seconds   INR 0.96  0.00 - 1.49  ABO/RH     Status: None   Collection Time    06/14/12  1:35 PM      Result Value Range   ABO/RH(D) O POS     Dg Chest 2 View  06/14/2012  *RADIOLOGY REPORT*  Clinical Data: Preoperative respiratory exam. Reimplantation of left total knee replacement.  CHEST - 2 VIEW  Comparison: 08/22/2009  Findings: The heart size and pulmonary vascularity are normal and the lungs are clear.  No acute osseous abnormality.  No effusions.  IMPRESSION: No acute abnormalities.   Original Report Authenticated By: Francene Boyers, M.D.     Review of Systems  Constitutional: Negative.   HENT: Negative.   Eyes: Negative.   Respiratory: Positive for shortness of breath (with exertion).   Cardiovascular: Negative.   Gastrointestinal: Negative.   Genitourinary: Negative.   Musculoskeletal: Positive for back pain and joint pain.  Skin: Negative.   Neurological: Negative.   Endo/Heme/Allergies: Negative.   Psychiatric/Behavioral: Negative.      Physical Exam  Constitutional: He is oriented to person, place, and time and well-developed, well-nourished, and in no distress.  HENT:  Head: Normocephalic and atraumatic.  Mouth/Throat: Oropharynx is clear and moist.  Eyes: Pupils are equal, round, and reactive to light.  Neck: Neck supple. No  JVD present. No tracheal deviation present. No thyromegaly present.  Cardiovascular: Normal rate, regular rhythm, normal heart sounds and intact distal pulses.   Pulmonary/Chest: Effort normal  and breath sounds normal. No stridor. No respiratory distress. He has no wheezes.  Abdominal: Soft. There is no tenderness. There is no guarding.  Musculoskeletal:       Left knee: He exhibits decreased range of motion, swelling, laceration (multiple healed incisions), abnormal alignment (varus) and bony tenderness. He exhibits no effusion, no ecchymosis and no erythema. Tenderness found.  Lymphadenopathy:    He has no cervical adenopathy.  Neurological: He is alert and oriented to person, place, and time.  Skin: Skin is warm and dry.  Psychiatric: Affect normal.      Assessment/Plan Assessment:   Left knee previous infection with removal of prothesis and implantation of antibiotic spacer, multiple times.  Plan: Patient will undergo a removal of antibiotic spacer and implantation of total knee arthroplasty on 06/14/2012 per Dr. Charlann Boxer at [facility]. Risks benefits and expectations were discussed with the patient. Patient understand risks, benefits and expectations and wishes to proceed.   Anastasio Auerbach Daliah Chaudoin   PAC  06/14/2012, 4:28 PM

## 2012-06-23 ENCOUNTER — Inpatient Hospital Stay (HOSPITAL_COMMUNITY)
Admission: RE | Admit: 2012-06-23 | Discharge: 2012-06-27 | DRG: 467 | Disposition: A | Discharge status: Home / Self Care | Payer: Worker's Compensation | Source: Ambulatory Visit | Attending: Orthopedic Surgery | Admitting: Orthopedic Surgery

## 2012-06-23 ENCOUNTER — Encounter (HOSPITAL_COMMUNITY): Payer: Self-pay | Admitting: Anesthesiology

## 2012-06-23 ENCOUNTER — Encounter (HOSPITAL_COMMUNITY): Payer: Self-pay | Admitting: *Deleted

## 2012-06-23 ENCOUNTER — Encounter (HOSPITAL_COMMUNITY)
Admission: RE | Disposition: A | Discharge status: Home / Self Care | Payer: Self-pay | Source: Ambulatory Visit | Attending: Orthopedic Surgery

## 2012-06-23 ENCOUNTER — Ambulatory Visit (HOSPITAL_COMMUNITY): Payer: Worker's Compensation | Admitting: Anesthesiology

## 2012-06-23 DIAGNOSIS — Z01812 Encounter for preprocedural laboratory examination: Secondary | ICD-10-CM

## 2012-06-23 DIAGNOSIS — D500 Iron deficiency anemia secondary to blood loss (chronic): Secondary | ICD-10-CM

## 2012-06-23 DIAGNOSIS — Z96652 Presence of left artificial knee joint: Secondary | ICD-10-CM

## 2012-06-23 DIAGNOSIS — E039 Hypothyroidism, unspecified: Secondary | ICD-10-CM | POA: Diagnosis present

## 2012-06-23 DIAGNOSIS — Z6841 Body Mass Index (BMI) 40.0 and over, adult: Secondary | ICD-10-CM

## 2012-06-23 DIAGNOSIS — E669 Obesity, unspecified: Secondary | ICD-10-CM | POA: Diagnosis present

## 2012-06-23 DIAGNOSIS — Z96659 Presence of unspecified artificial knee joint: Secondary | ICD-10-CM

## 2012-06-23 DIAGNOSIS — M21869 Other specified acquired deformities of unspecified lower leg: Principal | ICD-10-CM | POA: Diagnosis present

## 2012-06-23 DIAGNOSIS — D62 Acute posthemorrhagic anemia: Secondary | ICD-10-CM | POA: Diagnosis not present

## 2012-06-23 DIAGNOSIS — M25569 Pain in unspecified knee: Secondary | ICD-10-CM | POA: Diagnosis present

## 2012-06-23 HISTORY — PX: REIMPLANTATION OF TOTAL KNEE: SHX6052

## 2012-06-23 HISTORY — DX: Presence of unspecified artificial knee joint: Z96.659

## 2012-06-23 LAB — GRAM STAIN

## 2012-06-23 LAB — TYPE AND SCREEN
ABO/RH(D): O POS
Antibody Screen: NEGATIVE

## 2012-06-23 SURGERY — REVISION, TOTAL ARTHROPLASTY, KNEE
Anesthesia: Spinal | Site: Knee | Laterality: Left | Wound class: Clean

## 2012-06-23 MED ORDER — KETAMINE HCL 10 MG/ML IJ SOLN
INTRAMUSCULAR | Status: DC | PRN
  Administered 2012-06-23 (×9): 5 mg via INTRAVENOUS
  Administered 2012-06-23: 10 mg via INTRAVENOUS
  Administered 2012-06-23 (×2): 5 mg via INTRAVENOUS

## 2012-06-23 MED ORDER — BUPIVACAINE IN DEXTROSE 0.75-8.25 % IT SOLN
INTRATHECAL | Status: DC | PRN
  Administered 2012-06-23: 2 mL via INTRATHECAL

## 2012-06-23 MED ORDER — HYDROMORPHONE HCL PF 1 MG/ML IJ SOLN
INTRAMUSCULAR | Status: AC
  Filled 2012-06-23: qty 1

## 2012-06-23 MED ORDER — ACETAMINOPHEN 10 MG/ML IV SOLN
INTRAVENOUS | Status: AC
  Filled 2012-06-23: qty 100

## 2012-06-23 MED ORDER — DIPHENHYDRAMINE HCL 25 MG PO CAPS: 25.0000 mg | ORAL_CAPSULE | Freq: Every day | ORAL | Status: DC | PRN

## 2012-06-23 MED ORDER — PHENOL 1.4 % MT LIQD: 1.0000 | OROMUCOSAL | Status: DC | PRN

## 2012-06-23 MED ORDER — VANCOMYCIN HCL 1000 MG IV SOLR
INTRAVENOUS | Status: DC | PRN
  Administered 2012-06-23: 3 g

## 2012-06-23 MED ORDER — PROPOFOL 10 MG/ML IV BOLUS
INTRAVENOUS | Status: DC | PRN
  Administered 2012-06-23: 50 mg via INTRAVENOUS

## 2012-06-23 MED ORDER — SODIUM CHLORIDE 0.9 % IR SOLN
Status: DC | PRN
  Administered 2012-06-23: 1000 mL

## 2012-06-23 MED ORDER — BUPIVACAINE-EPINEPHRINE PF 0.25-1:200000 % IJ SOLN
INTRAMUSCULAR | Status: AC
  Filled 2012-06-23: qty 30

## 2012-06-23 MED ORDER — HYDROMORPHONE HCL PF 1 MG/ML IJ SOLN
0.5000 mg | INTRAMUSCULAR | Status: DC | PRN
  Filled 2012-06-23 (×3): qty 1

## 2012-06-23 MED ORDER — MENTHOL 3 MG MT LOZG: 1.0000 | LOZENGE | OROMUCOSAL | Status: DC | PRN

## 2012-06-23 MED ORDER — CEFAZOLIN SODIUM 10 G IJ SOLR
3.0000 g | INTRAMUSCULAR | Status: AC
  Administered 2012-06-23: 3 g via INTRAVENOUS
  Filled 2012-06-23: qty 3000

## 2012-06-23 MED ORDER — HYDROMORPHONE HCL PF 1 MG/ML IJ SOLN
0.2500 mg | INTRAMUSCULAR | Status: DC | PRN
  Administered 2012-06-23 (×3): 0.5 mg via INTRAVENOUS

## 2012-06-23 MED ORDER — FERROUS SULFATE 325 (65 FE) MG PO TABS
325.0000 mg | ORAL_TABLET | Freq: Three times a day (TID) | ORAL | Status: DC
  Administered 2012-06-24 – 2012-06-27 (×10): 325 mg via ORAL
  Filled 2012-06-23 (×14): qty 1

## 2012-06-23 MED ORDER — BISMUTH SUBSALICYLATE 262 MG/15ML PO SUSP
15.0000 mL | Freq: Four times a day (QID) | ORAL | Status: DC | PRN
  Filled 2012-06-23: qty 236

## 2012-06-23 MED ORDER — METHOCARBAMOL 500 MG PO TABS
500.0000 mg | ORAL_TABLET | Freq: Four times a day (QID) | ORAL | Status: DC | PRN
  Administered 2012-06-24 – 2012-06-25 (×3): 500 mg via ORAL
  Filled 2012-06-23 (×4): qty 1

## 2012-06-23 MED ORDER — CHLORHEXIDINE GLUCONATE 4 % EX LIQD: 60.0000 mL | Freq: Once | CUTANEOUS | Status: DC

## 2012-06-23 MED ORDER — ONDANSETRON HCL 4 MG/2ML IJ SOLN: 4.0000 mg | Freq: Four times a day (QID) | INTRAMUSCULAR | Status: DC | PRN

## 2012-06-23 MED ORDER — FENTANYL CITRATE 0.05 MG/ML IJ SOLN
INTRAMUSCULAR | Status: DC | PRN
  Administered 2012-06-23 (×7): 25 ug via INTRAVENOUS

## 2012-06-23 MED ORDER — DOCUSATE SODIUM 100 MG PO CAPS
100.0000 mg | ORAL_CAPSULE | Freq: Two times a day (BID) | ORAL | Status: DC
  Administered 2012-06-23 – 2012-06-27 (×7): 100 mg via ORAL

## 2012-06-23 MED ORDER — BUPIVACAINE LIPOSOME 1.3 % IJ SUSP
20.0000 mL | Freq: Once | INTRAMUSCULAR | Status: DC
  Filled 2012-06-23: qty 20

## 2012-06-23 MED ORDER — DEXAMETHASONE SODIUM PHOSPHATE 10 MG/ML IJ SOLN
10.0000 mg | Freq: Once | INTRAMUSCULAR | Status: AC
  Administered 2012-06-23: 4 mg via INTRAVENOUS

## 2012-06-23 MED ORDER — METHOCARBAMOL 100 MG/ML IJ SOLN
500.0000 mg | Freq: Four times a day (QID) | INTRAVENOUS | Status: DC | PRN
  Administered 2012-06-23: 500 mg via INTRAVENOUS
  Filled 2012-06-23 (×2): qty 5

## 2012-06-23 MED ORDER — BUPIVACAINE-EPINEPHRINE PF 0.25-1:200000 % IJ SOLN
INTRAMUSCULAR | Status: DC | PRN
  Administered 2012-06-23: 25 mL

## 2012-06-23 MED ORDER — OXYCODONE HCL 5 MG PO TABS
5.0000 mg | ORAL_TABLET | ORAL | Status: DC
  Administered 2012-06-23 – 2012-06-25 (×8): 15 mg via ORAL
  Administered 2012-06-26: 10 mg via ORAL
  Administered 2012-06-27: 5 mg via ORAL
  Filled 2012-06-23 (×3): qty 3
  Filled 2012-06-23: qty 2
  Filled 2012-06-23 (×7): qty 3

## 2012-06-23 MED ORDER — PROMETHAZINE HCL 25 MG/ML IJ SOLN: 6.2500 mg | INTRAMUSCULAR | Status: DC | PRN

## 2012-06-23 MED ORDER — VANCOMYCIN HCL 1000 MG IV SOLR
INTRAVENOUS | Status: AC
  Filled 2012-06-23: qty 3000

## 2012-06-23 MED ORDER — ACETAMINOPHEN 10 MG/ML IV SOLN
INTRAVENOUS | Status: DC | PRN
  Administered 2012-06-23: 1000 mg via INTRAVENOUS

## 2012-06-23 MED ORDER — BISACODYL 10 MG RE SUPP: 10.0000 mg | Freq: Every day | RECTAL | Status: DC | PRN

## 2012-06-23 MED ORDER — METOCLOPRAMIDE HCL 10 MG PO TABS: 5.0000 mg | ORAL_TABLET | Freq: Three times a day (TID) | ORAL | Status: DC | PRN

## 2012-06-23 MED ORDER — METOCLOPRAMIDE HCL 5 MG/ML IJ SOLN
5.0000 mg | Freq: Three times a day (TID) | INTRAMUSCULAR | Status: DC | PRN
  Administered 2012-06-24: 10 mg via INTRAVENOUS
  Filled 2012-06-23: qty 2

## 2012-06-23 MED ORDER — SODIUM CHLORIDE 0.9 % IV SOLN
INTRAVENOUS | Status: DC
  Administered 2012-06-23: 22:00:00 via INTRAVENOUS
  Filled 2012-06-23 (×11): qty 1000

## 2012-06-23 MED ORDER — DEXAMETHASONE SODIUM PHOSPHATE 10 MG/ML IJ SOLN
10.0000 mg | Freq: Once | INTRAMUSCULAR | Status: AC
  Administered 2012-06-24: 10 mg via INTRAVENOUS
  Filled 2012-06-23: qty 1

## 2012-06-23 MED ORDER — CEFAZOLIN SODIUM-DEXTROSE 2-3 GM-% IV SOLR
2.0000 g | Freq: Four times a day (QID) | INTRAVENOUS | Status: AC
  Administered 2012-06-23 – 2012-06-24 (×2): 2 g via INTRAVENOUS
  Filled 2012-06-23 (×2): qty 50

## 2012-06-23 MED ORDER — ZOLPIDEM TARTRATE 5 MG PO TABS: 5.0000 mg | ORAL_TABLET | Freq: Every evening | ORAL | Status: DC | PRN

## 2012-06-23 MED ORDER — FLEET ENEMA 7-19 GM/118ML RE ENEM: 1.0000 | ENEMA | Freq: Once | RECTAL | Status: AC | PRN

## 2012-06-23 MED ORDER — SODIUM CHLORIDE 0.9 % IJ SOLN
INTRAMUSCULAR | Status: DC | PRN
  Administered 2012-06-23: 18:00:00

## 2012-06-23 MED ORDER — CELECOXIB 200 MG PO CAPS
200.0000 mg | ORAL_CAPSULE | Freq: Two times a day (BID) | ORAL | Status: DC
  Administered 2012-06-23 – 2012-06-27 (×7): 200 mg via ORAL
  Filled 2012-06-23 (×9): qty 1

## 2012-06-23 MED ORDER — LACTATED RINGERS IV SOLN
INTRAVENOUS | Status: DC
  Administered 2012-06-23: 15:00:00 via INTRAVENOUS
  Administered 2012-06-23: 1000 mL via INTRAVENOUS
  Administered 2012-06-23: 17:00:00 via INTRAVENOUS

## 2012-06-23 MED ORDER — KETOROLAC TROMETHAMINE 30 MG/ML IJ SOLN
INTRAMUSCULAR | Status: AC
  Filled 2012-06-23: qty 1

## 2012-06-23 MED ORDER — PHENYLEPHRINE HCL 10 MG/ML IJ SOLN
INTRAMUSCULAR | Status: DC | PRN
  Administered 2012-06-23 (×2): 120 ug via INTRAVENOUS
  Administered 2012-06-23 (×2): 80 ug via INTRAVENOUS

## 2012-06-23 MED ORDER — ALUM & MAG HYDROXIDE-SIMETH 200-200-20 MG/5ML PO SUSP: 30.0000 mL | ORAL | Status: DC | PRN

## 2012-06-23 MED ORDER — RIVAROXABAN 10 MG PO TABS
10.0000 mg | ORAL_TABLET | ORAL | Status: DC
  Administered 2012-06-24 – 2012-06-27 (×4): 10 mg via ORAL
  Filled 2012-06-23 (×5): qty 1

## 2012-06-23 MED ORDER — CEFAZOLIN SODIUM-DEXTROSE 2-3 GM-% IV SOLR
INTRAVENOUS | Status: AC
  Filled 2012-06-23: qty 50

## 2012-06-23 MED ORDER — POLYETHYLENE GLYCOL 3350 17 G PO PACK
17.0000 g | PACK | Freq: Two times a day (BID) | ORAL | Status: DC
  Administered 2012-06-23 – 2012-06-27 (×7): 17 g via ORAL

## 2012-06-23 MED ORDER — ONDANSETRON HCL 4 MG/2ML IJ SOLN
INTRAMUSCULAR | Status: DC | PRN
  Administered 2012-06-23: 4 mg via INTRAVENOUS

## 2012-06-23 MED ORDER — HYDROMORPHONE HCL PF 1 MG/ML IJ SOLN
INTRAMUSCULAR | Status: AC
  Administered 2012-06-23: 1 mg
  Filled 2012-06-23: qty 1

## 2012-06-23 MED ORDER — CEFAZOLIN SODIUM 1-5 GM-% IV SOLN
INTRAVENOUS | Status: AC
  Filled 2012-06-23: qty 50

## 2012-06-23 MED ORDER — PROPOFOL INFUSION 10 MG/ML OPTIME
INTRAVENOUS | Status: DC | PRN
  Administered 2012-06-23: 140 ug/kg/min via INTRAVENOUS

## 2012-06-23 MED ORDER — ONDANSETRON HCL 4 MG PO TABS: 4.0000 mg | ORAL_TABLET | Freq: Four times a day (QID) | ORAL | Status: DC | PRN

## 2012-06-23 MED ORDER — MIDAZOLAM HCL 5 MG/5ML IJ SOLN
INTRAMUSCULAR | Status: DC | PRN
  Administered 2012-06-23 (×3): 1 mg via INTRAVENOUS

## 2012-06-23 MED ORDER — LACTATED RINGERS IV SOLN: INTRAVENOUS | Status: DC

## 2012-06-23 MED ORDER — KETOROLAC TROMETHAMINE 30 MG/ML IJ SOLN
INTRAMUSCULAR | Status: DC | PRN
  Administered 2012-06-23: 30 mg

## 2012-06-23 MED ORDER — LEVOTHYROXINE SODIUM 100 MCG PO TABS
100.0000 ug | ORAL_TABLET | Freq: Every day | ORAL | Status: DC
  Administered 2012-06-24 – 2012-06-27 (×4): 100 ug via ORAL
  Filled 2012-06-23 (×6): qty 1

## 2012-06-23 MED ORDER — 0.9 % SODIUM CHLORIDE (POUR BTL) OPTIME
TOPICAL | Status: DC | PRN
  Administered 2012-06-23: 1000 mL

## 2012-06-23 MED ORDER — PHENYLEPHRINE HCL 10 MG/ML IJ SOLN
10.0000 mg | INTRAVENOUS | Status: DC | PRN
  Administered 2012-06-23: 30 ug/min via INTRAVENOUS

## 2012-06-23 SURGICAL SUPPLY — 68 items
ADAPTER BOLT FEMORAL +2/-2 (Knees) ×2 IMPLANT
ADPR FEM +2/-2 OFST BOLT (Knees) ×1 IMPLANT
AUGMENT DISTAL LEFT 12 (Knees) ×1 IMPLANT
BAG ZIPLOCK 12X15 (MISCELLANEOUS) ×2 IMPLANT
BANDAGE ELASTIC 6 VELCRO ST LF (GAUZE/BANDAGES/DRESSINGS) ×2 IMPLANT
BANDAGE ESMARK 6X9 LF (GAUZE/BANDAGES/DRESSINGS) ×1 IMPLANT
BLADE SAW SGTL 13.0X1.19X90.0M (BLADE) ×2 IMPLANT
BLADE SAW SGTL 81X20 HD (BLADE) ×2 IMPLANT
BNDG ESMARK 6X9 LF (GAUZE/BANDAGES/DRESSINGS) ×2
BRUSH FEMORAL CANAL (MISCELLANEOUS) ×2 IMPLANT
CEMENT HV SMART SET (Cement) ×6 IMPLANT
CLOTH BEACON ORANGE TIMEOUT ST (SAFETY) ×2 IMPLANT
CUFF TOURN SGL QUICK 44 (TOURNIQUET CUFF) ×2 IMPLANT
DISTAL AUG 12 LEFT (Knees) ×2 IMPLANT
DRAPE EXTREMITY T 121X128X90 (DRAPE) ×2 IMPLANT
DRAPE POUCH INSTRU U-SHP 10X18 (DRAPES) ×2 IMPLANT
DRAPE U-SHAPE 47X51 STRL (DRAPES) ×2 IMPLANT
DRSG ADAPTIC 3X8 NADH LF (GAUZE/BANDAGES/DRESSINGS) ×2 IMPLANT
DRSG PAD ABDOMINAL 8X10 ST (GAUZE/BANDAGES/DRESSINGS) ×4 IMPLANT
DURAPREP 26ML APPLICATOR (WOUND CARE) ×2 IMPLANT
ELECT REM PT RETURN 9FT ADLT (ELECTROSURGICAL) ×2
ELECTRODE REM PT RTRN 9FT ADLT (ELECTROSURGICAL) ×1 IMPLANT
EVACUATOR 1/8 PVC DRAIN (DRAIN) ×2 IMPLANT
FACESHIELD LNG OPTICON STERILE (SAFETY) ×10 IMPLANT
FEMORAL ADAPTER (Orthopedic Implant) ×2 IMPLANT
FEMUR LFT TC3 REVISION (Knees) ×2 IMPLANT
GAUZE XEROFORM 5X9 LF (GAUZE/BANDAGES/DRESSINGS) ×2 IMPLANT
GLOVE BIOGEL PI IND STRL 7.5 (GLOVE) ×1 IMPLANT
GLOVE BIOGEL PI IND STRL 8 (GLOVE) ×1 IMPLANT
GLOVE BIOGEL PI INDICATOR 7.5 (GLOVE) ×1
GLOVE BIOGEL PI INDICATOR 8 (GLOVE) ×1
GLOVE ECLIPSE 8.0 STRL XLNG CF (GLOVE) ×2 IMPLANT
GLOVE ORTHO TXT STRL SZ7.5 (GLOVE) ×4 IMPLANT
GOWN BRE IMP PREV XXLGXLNG (GOWN DISPOSABLE) ×4 IMPLANT
GOWN STRL NON-REIN LRG LVL3 (GOWN DISPOSABLE) ×2 IMPLANT
HANDPIECE INTERPULSE COAX TIP (DISPOSABLE) ×1
INSERT SIGMA RP TC3 5 17.5 (Knees) ×2 IMPLANT
KIT BASIN OR (CUSTOM PROCEDURE TRAY) ×2 IMPLANT
MANIFOLD NEPTUNE II (INSTRUMENTS) ×2 IMPLANT
NDL SAFETY ECLIPSE 18X1.5 (NEEDLE) ×1 IMPLANT
NEEDLE HYPO 18GX1.5 SHARP (NEEDLE) ×1
NS IRRIG 1000ML POUR BTL (IV SOLUTION) ×2 IMPLANT
PACK TOTAL JOINT (CUSTOM PROCEDURE TRAY) ×2 IMPLANT
PADDING CAST COTTON 6X4 STRL (CAST SUPPLIES) ×4 IMPLANT
POSITIONER SURGICAL ARM (MISCELLANEOUS) ×2 IMPLANT
SET HNDPC FAN SPRY TIP SCT (DISPOSABLE) ×1 IMPLANT
SET PAD KNEE POSITIONER (MISCELLANEOUS) ×2 IMPLANT
SLEEVE FEMORAL UNIV 40MM (Knees) ×2 IMPLANT
SPONGE GAUZE 4X4 12PLY (GAUZE/BANDAGES/DRESSINGS) ×2 IMPLANT
SPONGE LAP 18X18 X RAY DECT (DISPOSABLE) ×8 IMPLANT
STAPLER VISISTAT 35W (STAPLE) IMPLANT
STEM UNIVERSAL REVISION 75X14 (Stem) ×2 IMPLANT
STEM UNIVERSAL REVISION 75X18 (Stem) ×2 IMPLANT
SUCTION FRAZIER 12FR DISP (SUCTIONS) ×4 IMPLANT
SUT VIC AB 1 CT1 36 (SUTURE) ×6 IMPLANT
SUT VIC AB 2-0 CT1 27 (SUTURE) ×3
SUT VIC AB 2-0 CT1 TAPERPNT 27 (SUTURE) ×3 IMPLANT
SWAB COLLECTION DEVICE MRSA (MISCELLANEOUS) ×2 IMPLANT
SYR 50ML LL SCALE MARK (SYRINGE) ×2 IMPLANT
TOWEL OR 17X26 10 PK STRL BLUE (TOWEL DISPOSABLE) ×4 IMPLANT
TOWER CARTRIDGE SMART MIX (DISPOSABLE) ×2 IMPLANT
TRAY FOLEY CATH 14FRSI W/METER (CATHETERS) ×2 IMPLANT
TRAY SLEEVE POROUS 61 ×2 IMPLANT
TRAY TIB SZ 5 REVISION (Knees) ×2 IMPLANT
TUBE ANAEROBIC SPECIMEN COL (MISCELLANEOUS) ×2 IMPLANT
WATER STERILE IRR 1500ML POUR (IV SOLUTION) ×4 IMPLANT
WEDGE LEFT SIZE 54MM (Knees) ×2 IMPLANT
WRAP KNEE MAXI GEL POST OP (GAUZE/BANDAGES/DRESSINGS) ×2 IMPLANT

## 2012-06-23 NOTE — Anesthesia Postprocedure Evaluation (Signed)
Anesthesia Post Note  Patient: [name]  Procedure(s) Performed: Procedure(s) (LRB): REIMPLANTATION OF LEFT TOTAL KNEE (Left)  Anesthesia type: Spinal  Patient location: PACU  Post pain: Pain level controlled  Post assessment: Post-op Vital signs reviewed  Last Vitals:  Filed Vitals:   06/23/12 2249  BP: 104/67  Pulse: 89  Temp: 36.4 C  Resp: 16    Post vital signs: Reviewed  Level of consciousness: sedated  Complications: No apparent anesthesia complications

## 2012-06-23 NOTE — Interval H&P Note (Signed)
History and Physical Interval Note:  06/23/2012 2:01 PM  [name]  has presented today for surgery, with the diagnosis of STATUS POST LEFT TOTAL KNEE RESECTION  The various methods of treatment have been discussed with the patient and family. After consideration of risks, benefits and other options for treatment, the patient has consented to  Procedure(s): REIMPLANTATION OF LEFT TOTAL KNEE (Left) as a surgical intervention .  The patient's history has been reviewed, patient examined, no change in status, stable for surgery.  I have reviewed the patient's chart and labs.  Questions were answered to the patient's satisfaction.     Shelda Pal

## 2012-06-23 NOTE — Anesthesia Procedure Notes (Signed)
Spinal  Patient location during procedure: OR Start time: 06/23/2012 2:10 PM End time: 06/23/2012 2:20 PM Staffing Anesthesiologist: Lucille Passy F Performed by: anesthesiologist  Preanesthetic Checklist Completed: patient identified, site marked, surgical consent, pre-op evaluation, timeout performed, IV checked, risks and benefits discussed and monitors and equipment checked Spinal Block Patient position: sitting Prep: Betadine Patient monitoring: heart rate, continuous pulse ox and blood pressure Approach: midline Location: L3-4 Injection technique: single-shot Needle Needle type: Spinocan  Needle gauge: 22 G Needle length: 12.7 cm Additional Notes Expiration date of kit checked and confirmed. Patient tolerated procedure well, without complications.

## 2012-06-23 NOTE — Preoperative (Signed)
Beta Blockers   Reason not to administer Beta Blockers:Not Applicable, not on home BB 

## 2012-06-23 NOTE — Anesthesia Preprocedure Evaluation (Addendum)
Anesthesia Evaluation  Patient identified by MRN, date of birth, ID band Patient awake    Reviewed: Allergy & Precautions, H&P , NPO status , Patient's Chart, lab work & pertinent test results  History of Anesthesia Complications (+) PROLONGED EMERGENCE  Airway Mallampati: II TM Distance: >3 FB Neck ROM: Full    Dental  (+) Poor Dentition, Dental Advisory Given, Missing and Chipped,    Pulmonary shortness of breath and with exertion,  breath sounds clear to auscultation  Pulmonary exam normal       Cardiovascular negative cardio ROS  Rhythm:Regular Rate:Normal     Neuro/Psych negative neurological ROS  negative psych ROS   GI/Hepatic negative GI ROS, Neg liver ROS,   Endo/Other  diabetesHypothyroidism Morbid obesity  Renal/GU Renal InsufficiencyRenal disease  negative genitourinary   Musculoskeletal negative musculoskeletal ROS (+)   Abdominal   Peds  Hematology negative hematology ROS (+)   Anesthesia Other Findings   Reproductive/Obstetrics                          Anesthesia Physical Anesthesia Plan  ASA: III  Anesthesia Plan: Spinal   Post-op Pain Management:    Induction: Intravenous  Airway Management Planned: Simple Face Mask  Additional Equipment:   Intra-op Plan:   Post-operative Plan:   Informed Consent: I have reviewed the patients History and Physical, chart, labs and discussed the procedure including the risks, benefits and alternatives for the proposed anesthesia with the patient or authorized representative who has indicated his/her understanding and acceptance.   Dental advisory given  Plan Discussed with: CRNA  Anesthesia Plan Comments:         Anesthesia Quick Evaluation

## 2012-06-23 NOTE — Transfer of Care (Signed)
Immediate Anesthesia Transfer of Care Note  Patient: [name]  Procedure(s) Performed: Procedure(s): REIMPLANTATION OF LEFT TOTAL KNEE (Left)  Patient Location: PACU  Anesthesia Type:Regional  Level of Consciousness: awake, alert  and oriented  Airway & Oxygen Therapy: Patient Spontanous Breathing and Patient connected to face mask oxygen  Post-op Assessment: Report given to PACU RN and Post -op Vital signs reviewed and stable  Post vital signs: Reviewed and stable  Complications: No apparent anesthesia complications

## 2012-06-24 LAB — BASIC METABOLIC PANEL
BUN: 23 mg/dL (ref 6–23)
Chloride: 104 mEq/L (ref 96–112)
GFR calc Af Amer: 88 mL/min — ABNORMAL LOW (ref >90)
GFR calc non Af Amer: 76 mL/min — ABNORMAL LOW (ref >90)
Potassium: 4.5 mEq/L (ref 3.5–5.1)
Sodium: 137 mEq/L (ref 135–145)

## 2012-06-24 LAB — CBC
HCT: 32.6 % — ABNORMAL LOW (ref 39.0–52.0)
MCHC: 31 g/dL (ref 30.0–36.0)
Platelets: 188 10*3/uL (ref 150–400)
RDW: 14.2 % (ref 11.5–15.5)
WBC: 7.5 10*3/uL (ref 4.0–10.5)

## 2012-06-24 NOTE — Progress Notes (Signed)
Placed pt on nasal cpap per previous night's settings, 12cm h2o with 6l o2 bleedin.  RN notified.  Pt is tolerating well at this time.  Pt remains on continuous pulseox with HR74, sats96%.

## 2012-06-24 NOTE — Progress Notes (Signed)
Physical Therapy Treatment Patient Details Name: [name] MRN: [id number] DOB: 1950-03-13 Today's Date: 06/24/2012 Time: 4098-1191 PT Time Calculation (min): 24 min  PT Assessment / Plan / Recommendation Comments on Treatment Session       Follow Up Recommendations  SNF     Does the patient have the potential to tolerate intense rehabilitation     Barriers to Discharge        Equipment Recommendations  None recommended by PT    Recommendations for Other Services OT consult  Frequency 7X/week   Plan Discharge plan remains appropriate    Precautions / Restrictions Precautions Precautions: Fall Restrictions Weight Bearing Restrictions: No Other Position/Activity Restrictions: WBAT   Pertinent Vitals/Pain 5-6/10; premed, ice packs provided    Mobility  Transfers Transfers: Sit to Stand;Stand to Sit Sit to Stand: 4: Min guard Stand to Sit: 4: Min guard Details for Transfer Assistance: cues for LE management and use of UEs to self assist Ambulation/Gait Ambulation/Gait Assistance: 4: Min guard Ambulation Distance (Feet): 80 Feet (twice) Assistive device: Rolling walker Ambulation/Gait Assistance Details: min cues for posture and position from RW Gait Pattern: Step-to pattern;Antalgic;Decreased stance time - left Stairs: No    Exercises     PT Diagnosis:    PT Problem List:   PT Treatment Interventions:     PT Goals Acute Rehab PT Goals PT Goal Formulation: With patient Time For Goal Achievement: 07/07/12 Potential to Achieve Goals: Good Pt will go Supine/Side to Sit: with supervision PT Goal: Supine/Side to Sit - Progress: Goal set today Pt will go Sit to Supine/Side: with supervision PT Goal: Sit to Supine/Side - Progress: Goal set today Pt will go Sit to Stand: with supervision PT Goal: Sit to Stand - Progress: Progressing toward goal Pt will go Stand to Sit: with supervision PT Goal: Stand to Sit - Progress: Progressing toward goal Pt will Ambulate:  >150 feet;with supervision;with rolling walker PT Goal: Ambulate - Progress: Progressing toward goal Pt will Go Up / Down Stairs: 3-5 stairs;with least restrictive assistive device;with min assist PT Goal: Up/Down Stairs - Progress: Goal set today  Visit Information  Last PT Received On: 06/24/12 Assistance Needed: +1    Subjective Data  Subjective: I/m feeling good and ready to walk` Patient Stated Goal: Be able to go back to work   Cognition  Cognition Arousal/Alertness: Awake/alert Behavior During Therapy: WFL for tasks assessed/performed Overall Cognitive Status: Within Functional Limits for tasks assessed    Balance     End of Session PT - End of Session Equipment Utilized During Treatment: Gait belt Activity Tolerance: Patient tolerated treatment well Patient left: in chair;with call bell/phone within reach Nurse Communication: Mobility status   GP     [name] 06/24/2012, 5:19 PM

## 2012-06-24 NOTE — Progress Notes (Signed)
Patients sats dropped to 86%. Increased CPAP pressure from 10 cmH2O to 12 cmH2O. Will continue to monitor.

## 2012-06-24 NOTE — Op Note (Signed)
NAMELAWSEN, [name] NO.:  [id number]  MEDICAL RECORD NO.:  [id number]  LOCATION:  1619                         FACILITY:  [facility]  PHYSICIAN:  Madlyn Frankel. Charlann Boxer, M.D.  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  PROCEDURE:  Revision/reimplantation of left total knee replacement.  COMPONENTS USED:  DePuy rotating platform, revision knee system using a size 5 MBT revision tray with a 61 press-fit sleeve, a 14 x 75 mm press- fit stem, utilizing antibiotic cement to support fixation.  Femoral component was a size 5, PC3 femoral component with a +2 bolt, 5-degree adapter with a 40-mm press-fit sleeve and an 18 x 75 mm Press-Fit stem. I did use a 12 mm distal lateral augment and 4-mm distal medial augment, again utilizing antibiotic-laden cement to augment fixation.  The final insert was a 17.5, TC3 polyethylene insert.  The patella was not resurfaced based on its lacking remaining bone stock.  SURGEON:  Madlyn Frankel. Charlann Boxer, MD  ASSISTANT:  Lanney Gins, PA.  Note that [name] was present for the entire case from preoperative position, perioperative management of the upper extremity, general facilitation of the case, primary wound closure.  ANESTHESIA:  Spinal.  SPECIMENS:  Culture swabs were taken at the time of surgery.  TOURNIQUET TIME:  120 minutes at 300 mmHg.  Note that the tourniquet was let down after 109 minutes for final irrigation and debridement as well as preparation of final components.  DRAINS:  X1 medium Hemovac.  COMPLICATIONS:  None.  INDICATIONS FOR PROCEDURE:  [name] is a 62 year old gentleman who had self-referred for evaluation of his  left knee.  [name] had a history of multiple left knee surgeries, complicated by infection.  His last procedure performed approximately 4 years ago, at which point knee replacement was resected and antibiotic spacer placed.  He is currently dealt with this knee since that time.  He presented with pain, dysfunction, disability, and a desire to try and improve his overall status.  He is an obese male.  I had a lengthy discussion with him regarding the obvious risks of failure of this based on his previous history, the potential risk for recurrent infection. Lab work was negative for an elevated CRP or sedimentation rate, and Necessity for surgery were discussed.  He was eager to try to improve his quality of life.  PROCEDURE IN DETAIL:  The patient was brought to the operative theater. Once adequate anesthesia, preoperative antibiotics, 3 g of Ancef administered, he was positioned supine.  A left thigh tourniquet was placed.  The left lower extremity was then prepped and draped in sterile fashion utilizing the Mayo leg holder to maintain support leg throughout the case.  Obvious observation time of surgery was significant, instability due to his knee related to his lack of bone stock as well as a temporary spacer in place with noted hyperextension and significant varus  deformity.  After a time-out, identifying the patient, planned procedure, and extremity, the leg was exsanguinated, tourniquet elevated to 300 mmHg. The most medial based incision from his knee was utilized, excising old scar and recreating soft tissue planes.  A median arthrotomy was made, encountering a large synovial fluid which was had a very normal- appearing yellow appearance yet cultures were taken, nonetheless. Due to the complexity of this case, the vast majority of the first portion of the case was aimed to develope exposure of the knee.  I performed an adequate debridement, significantly debriding in the  medial and the lateral aspects of the knee as well as suprapatellar region was carried out, performing debridement, allowing for adequate exposure of the bone.  In doing so, I removed old cement blocks including pieces that had fragmented off as well as polyethylene insert used in this type of cement spacer implantation.  Both the femoral cement and the tibial cement were removed.  Following the debridement, I worked on identifying the canal, removing remaining cement from the canal and doing hand reaming.  On the femoral side, this included using [place] Cementless system in order to remove the cement restrictor placed in the distal femur.  The tibial cement restrictor was pushed down further in the shaft and thus unable to be retrieved.  On the tibial side, I was able to ream up to about 13 mm which had a very tight fit in the area where press-fit stem would fit.  I did then broached the proximal tibia and initially broached up to the 53 range and then placed a trial in this area.  This trial was assessed and found to be perpendicular.  The trial did not fit on the proximal bone due to the significant bone loss.  The purpose was to restore or to recreate the joint spaces as much as possible.  With a trial MBT revision tray with a 10-mm stem in place for alignment and a size 53 sleeve in place, I now attended to the femur.  Once the femur was exposed, I broached up to size 40 on this side and impacted It to the level for a TC3 component.  We then evaluated the cuts on the distal aspect of the femur, making minimal bone cut at all, but planning to have the femoral component sit proud  off this and rely on the sleeve within the metaphysis for support, but also to the bring femoral component back to its appropriate level to try and recreate the joint line.  All bone cuts were rechecked including recreation of the TC3 box.  At this point, we were doing trial reduction with the size 5 TC3  femoral component with the above-stated stem and sleeve and the tibial component placed.  I then did a trial reduction.  I found that I had to go all the way up to a 22.5-mm polyethylene to provide stability in extension and flexion.  Given this, I had decided that I would increase the sleeve on the tibial side to the maximum available which was 61 mm. I broached the tibia to the 61 to increase the tibial height in order to improve the stability both in flexion and extension.  At this point, all the trial components were removed.  I re-broached with the 61-mm sleeve on the tibial side.  I also confirmed the depth of hand reaming for the stems on the femoral and tibial side.  Once this was done, the final components were opened on  the back table. They were configured on the back table under my observation.  The knee was copiously irrigated including the canals with a canal brush irrigator as well as in the general aspect of the knee with 3 L of normal saline solution.  The tourniquet had been let down during the trial stages and at this point, the leg was re-exsanguinated and tourniquet elevated back up to 300 mmHg.  Once the components were configured on the back table and the cement was mixed including 3 batches of cement with 3 g of vancomycin.  The tibial component was then lightly cemented in the areas where I did not want to effect the press-fit interference and then I impacted the stem, had good fit both distally and proximally as anticipated.  A trial post was placed into the tibia and the femoral component was then cemented into position again utilizing the 12-mm lateral augment to maintain the distance that I had hoped for based on my trialing.  This femoral component was cemented in position.  The knee was then brought to extension with a 17.5-mm polyethylene.  The knee was held in extension until the cement fully cured.  Once the cement was cured, excess cement was removed  from the knee and the final 17.5-mm insert was selected based on the stability of the knee, assessed on exam from extension to flexion.  Again, debridement of the patellar region revealed a very thin patella and remaining bone stock was poor and thus not resurfaced.  At this point, we irrigated the knee again and placed a medium Hemovac drain deep.  I then reapproximated extensor mechanism using a combination of #1 Vicryl and 0 V-Loc suture.  The remaining wound was closed with 2-0 Vicryl and staples.  The skin was then cleaned, dried, and dressed sterilely using Xeroform and a bulky sterile wrap.  The patient was brought to the recovery room in stable condition, tolerating the procedure well.  He will remain in the hospital with physical therapy and will most likely require inpatient physical therapy rehab prior to returning home.     Madlyn Frankel Charlann Boxer, M.D.     MDO/MEDQ  D:  06/24/2012  T:  06/24/2012  Job:  [id number]

## 2012-06-24 NOTE — Progress Notes (Signed)
06/24/12 0245 Nursing Matt Babish called reg patient's o2 sats dropping to 80's upper 70's when asleep. When awakened, sats increase to upper 96 to 97 %. Orders received to try cpap machine. If this does not improve sats may transfer to step down for bipap.

## 2012-06-24 NOTE — Progress Notes (Addendum)
Patient placed on CPAP. Patients sats kept falling into the 60's and 70's. Settings for CPAP are 10 cmH2O with 6 L oxygen bleed in to keeps sats above 92%. Will continue to monitor.

## 2012-06-24 NOTE — Progress Notes (Signed)
OT Cancellation Note  Patient Details Name: [name] MRN: [id number] DOB: [birth year]   Cancelled Treatment:    Reason Eval/Treat Not Completed: Other (comment) (pt declines OT needs. Has had multiple knee surgeries)  Lennox Laity [phone] 06/24/2012, 2:57 PM

## 2012-06-24 NOTE — Progress Notes (Signed)
RN called and said patient's oxygen was dropping due to pt mouth breathing. Patient had surgery and everytime he would doze off the continous pulse oximeter would beep and pt would wake up. Placed patient on 28% venti mask from 5 L nasal canula so patient could get some rest. Will continue to monitor.

## 2012-06-24 NOTE — Progress Notes (Signed)
Called to room per patient's request.  Pt stated that the machine (continuous pulseox) was beeping when he fell asleep and asked that I look at cpap machine.  Upon arrival to pt beside, HR 79, sats97%.  Cpap adjusted from 12cm to 14cm  per pt comfort and ease of mind, with 6l o2 bleedin.  Pt tolerating ok at this time.  RN notified.

## 2012-06-24 NOTE — Progress Notes (Signed)
Subjective: 1 Day Post-Op Procedure(s) (LRB): REIMPLANTATION OF LEFT TOTAL KNEE (Left) Patient reports pain as well controlled. Nausea from last night has improved. Passing flatus. Tolerating PO's. Denies SOB, CP, OR calf pain.  Objective: Vital signs in last 24 hours: Temp:  [97.5 F (36.4 C)-98.7 F (37.1 C)] 97.8 F (36.6 C) (05/17 1000) Pulse Rate:  [73-103] 86 (05/17 1000) Resp:  [12-20] 18 (05/17 1000) BP: (100-143)/(51-90) 109/59 mmHg (05/17 1000) SpO2:  [86 %-100 %] 97 % (05/17 1000) Weight:  [174.635 kg (385 lb)] 174.635 kg (385 lb) (05/16 2044)  Intake/Output from previous day: 05/16 0701 - 05/17 0700 In: 4250 [I.V.:4200; IV Piggyback:50] Out: 2601 [Urine:1060; Emesis/NG output:1; Drains:840; Blood:700] Intake/Output this shift:     Recent Labs  06/24/12 0527  HGB 10.1*    Recent Labs  06/24/12 0527  WBC 7.5  RBC 3.50*  HCT 32.6*  PLT 188    Recent Labs  06/24/12 0527  NA 137  K 4.5  CL 104  CO2 28  BUN 23  CREATININE 1.04  GLUCOSE 153*  CALCIUM 9.2   No results found for this basename: LABPT, INR,  in the last 72 hours  Well nourished. Alert and oriented. Mild left knee soreness. Calf soft and non-tender. Left LE neurovascularly intact. Dressing C/D/I. No spreading redness or drainage.  Left knee drain d/c'ed patient tolerated well. Drain tip intact.  Assessment/Plan: 1 Day Post-Op Procedure(s) (LRB): REIMPLANTATION OF LEFT TOTAL KNEE (Left) Continue current care. Start PT. D/c Drain. Check CBC in am. Saline Lock IV if tolerating PO's.  STILWELL, BRYSON L 06/24/2012, 10:35 AM

## 2012-06-24 NOTE — Evaluation (Signed)
Physical Therapy Evaluation Patient Details Name: CULLEN LAHAIE MRN: [id number] DOB: 05-Sep-1950 Today's Date: 06/24/2012 Time: 4098-1191 PT Time Calculation (min): 34 min  PT Assessment / Plan / Recommendation Clinical Impression  Pt s/p L TKR reimplantation presents with decreased L LE strength/ROM and post op pain limiting functional mobility    PT Assessment  Patient needs continued PT services    Follow Up Recommendations  SNF    Does the patient have the potential to tolerate intense rehabilitation      Barriers to Discharge Decreased caregiver support      Equipment Recommendations  None recommended by PT    Recommendations for Other Services OT consult   Frequency 7X/week    Precautions / Restrictions Precautions Precautions: Fall Restrictions Weight Bearing Restrictions: No Other Position/Activity Restrictions: WBAT   Pertinent Vitals/Pain       Mobility  Bed Mobility Bed Mobility: Supine to Sit Supine to Sit: 4: Min assist Details for Bed Mobility Assistance: min cues for sequencing Transfers Transfers: Sit to Stand;Stand to Sit Sit to Stand: 4: Min assist Stand to Sit: 4: Min assist Details for Transfer Assistance: cues for LE management and use of UEs to self assist Ambulation/Gait Ambulation/Gait Assistance: 4: Min assist Ambulation Distance (Feet): 68 Feet Assistive device: Rolling walker Ambulation/Gait Assistance Details: cues for stride length, posture, sequence and position from RW Gait Pattern: Step-to pattern;Antalgic;Decreased stance time - left Stairs: No    Exercises Total Joint Exercises Ankle Circles/Pumps: AROM;Both;15 reps;Supine Quad Sets: AROM;10 reps;Both;Supine Heel Slides: AAROM;10 reps;Supine;Left Straight Leg Raises: AROM;AAROM;Left;10 reps;Supine   PT Diagnosis: Difficulty walking  PT Problem List: Decreased strength;Decreased range of motion;Decreased activity tolerance;Decreased mobility;Decreased knowledge of use of  DME;Obesity;Pain PT Treatment Interventions: DME instruction;Gait training;Stair training;Functional mobility training;Therapeutic activities;Therapeutic exercise;Patient/family education   PT Goals Acute Rehab PT Goals PT Goal Formulation: With patient Time For Goal Achievement: 07/07/12 Potential to Achieve Goals: Good Pt will go Supine/Side to Sit: with supervision PT Goal: Supine/Side to Sit - Progress: Goal set today Pt will go Sit to Supine/Side: with supervision PT Goal: Sit to Supine/Side - Progress: Goal set today Pt will go Sit to Stand: with supervision PT Goal: Sit to Stand - Progress: Goal set today Pt will go Stand to Sit: with supervision PT Goal: Stand to Sit - Progress: Goal set today Pt will Ambulate: >150 feet;with supervision;with rolling walker PT Goal: Ambulate - Progress: Goal set today Pt will Go Up / Down Stairs: 3-5 stairs;with least restrictive assistive device;with min assist PT Goal: Up/Down Stairs - Progress: Goal set today  Visit Information  Last PT Received On: 06/24/12 Assistance Needed: +1    Subjective Data  Subjective: I've had a spacer in for 4 yrs - this feels so much better Patient Stated Goal: Be able to go back to work   Prior Functioning  Home Living Lives With: Alone Type of Home: House Home Access: Stairs to enter Secretary/administrator of Steps: 3 Entrance Stairs-Rails: Right Home Layout: One level Home Adaptive Equipment: Straight cane;Walker - rolling Prior Function Level of Independence: Independent with assistive device(s) Able to Take Stairs?: Yes Vocation: On disability Communication Communication: No difficulties    Cognition  Cognition Arousal/Alertness: Awake/alert Behavior During Therapy: WFL for tasks assessed/performed Overall Cognitive Status: Within Functional Limits for tasks assessed    Extremity/Trunk Assessment Right Upper Extremity Assessment RUE ROM/Strength/Tone: [facility] for tasks assessed Left Upper  Extremity Assessment LUE ROM/Strength/Tone: [facility] for tasks assessed Right Lower Extremity Assessment RLE ROM/Strength/Tone: [facility] for tasks  assessed Left Lower Extremity Assessment LLE ROM/Strength/Tone: Deficits LLE ROM/Strength/Tone Deficits: 3/5 quads with pt able to IND SLR; AAROm at knee to 40 flex Trunk Assessment Trunk Assessment: Normal   Balance    End of Session PT - End of Session Equipment Utilized During Treatment: Gait belt Activity Tolerance: Patient tolerated treatment well Patient left: in chair;with call bell/phone within reach Nurse Communication: Mobility status  GP     Torri Langston 06/24/2012, 1:56 PM

## 2012-06-25 LAB — CBC
HCT: 30.9 % — ABNORMAL LOW (ref 39.0–52.0)
Hemoglobin: 9.8 g/dL — ABNORMAL LOW (ref 13.0–17.0)
MCV: 93.1 fL (ref 78.0–100.0)
RDW: 14.7 % (ref 11.5–15.5)
WBC: 7.4 10*3/uL (ref 4.0–10.5)

## 2012-06-25 LAB — BASIC METABOLIC PANEL
BUN: 16 mg/dL (ref 6–23)
CO2: 25 mEq/L (ref 19–32)
Chloride: 102 mEq/L (ref 96–112)
Creatinine, Ser: 0.81 mg/dL (ref 0.50–1.35)
GFR calc Af Amer: >90 mL/min (ref >90)
Glucose, Bld: 137 mg/dL — ABNORMAL HIGH (ref 70–99)

## 2012-06-25 NOTE — Progress Notes (Signed)
Clinical Social Work Department CLINICAL SOCIAL WORK PLACEMENT NOTE 06/25/2012  Patient:  [name], [name]  Account Number:  [id number] Admit date:  06/23/2012  Clinical Social Worker:  Doroteo Glassman  Date/time:  06/25/2012 04:19 PM  Clinical Social Work is seeking post-discharge placement for this patient at the following level of care:   SKILLED NURSING   (*CSW will update this form in Epic as items are completed)   Pt declined  Patient/family provided with [facility] Department of Clinical Social Work's list of facilities offering this level of care within the geographic area requested by the patient (or if unable, by the patient's family).  06/25/12  Patient/family informed of their freedom to choose among providers that offer the needed level of care, that participate in Medicare, Medicaid or managed care program needed by the patient, have an available bed and are willing to accept the patient.   n/a--Pt only wants [facility] Patient/family informed of [facility]' ownership interest in [facility], as well as of the fact that they are under no obligation to receive care at this facility.  PASARR submitted to EDS on 06/25/2012 PASARR number received from [employer] on 06/25/2012  FL2 transmitted to all facilities in geographic area requested by pt/family on  06/25/2012 FL2 transmitted to all facilities within larger geographic area on   Patient informed that his/her managed care company has contracts with or will negotiate with  certain facilities, including the following:     Patient/family informed of bed offers received:   Patient chooses bed at  Physician recommends and patient chooses bed at    Patient to be transferred to  on   Patient to be transferred to facility by   The following physician request were entered in Epic:   Additional Comments:

## 2012-06-25 NOTE — Progress Notes (Signed)
Physical Therapy Treatment Patient Details Name: [name] MRN: [id number] DOB: [birth year] Today's Date: 06/25/2012 Time: 2952-8413 PT Time Calculation (min): 23 min  PT Assessment / Plan / Recommendation Comments on Treatment Session  Progressing well with mobility. Pt ambulated 400' with RW, ther ex for L TKA completed. Knee flexion 85* AAROM.     Follow Up Recommendations  SNF     Does the patient have the potential to tolerate intense rehabilitation     Barriers to Discharge        Equipment Recommendations  None recommended by PT    Recommendations for Other Services OT consult  Frequency 7X/week   Plan Discharge plan remains appropriate    Precautions / Restrictions Precautions Precautions: Fall Restrictions Weight Bearing Restrictions: No Other Position/Activity Restrictions: WBAT   Pertinent Vitals/Pain **3/10 L knee with walking Ice applied*    Mobility  Bed Mobility Bed Mobility: Not assessed Transfers Transfers: Sit to Stand;Stand to Sit Sit to Stand: With armrests;With upper extremity assist;From chair/3-in-1;6: Modified independent (Device/Increase time) Stand to Sit: To chair/3-in-1;With upper extremity assist;With armrests;6: Modified independent (Device/Increase time) Ambulation/Gait Ambulation/Gait Assistance: 6: Modified independent (Device/Increase time) Ambulation Distance (Feet): 400 Feet Assistive device: Rolling walker Gait Pattern: Step-to pattern;Antalgic General Gait Details: steady, no LOB Stairs: No    Exercises Total Joint Exercises Ankle Circles/Pumps: AROM;Both;15 reps;Supine Quad Sets: AROM;10 reps;Both;Supine Towel Squeeze: AROM;Both;10 reps Short Arc Quad: AROM;Left;20 reps Heel Slides: AAROM;10 reps;Supine;Left Hip ABduction/ADduction: AROM;Left;15 reps Straight Leg Raises: AROM;AAROM;Left;10 reps;Supine Long Arc Quad: AROM;20 reps;Left;Seated Knee Flexion: AAROM;Left;10 reps;Seated Goniometric ROM: flexion L knee AAROM  85*, ext 0*   PT Diagnosis:    PT Problem List:   PT Treatment Interventions:     PT Goals Acute Rehab PT Goals PT Goal Formulation: With patient Time For Goal Achievement: 07/07/12 Potential to Achieve Goals: Good Pt will go Supine/Side to Sit: with supervision Pt will go Sit to Supine/Side: with supervision Pt will go Sit to Stand: with modified independence PT Goal: Sit to Stand - Progress: Met Pt will go Stand to Sit: with modified independence PT Goal: Stand to Sit - Progress: Met Pt will Ambulate: >150 feet;with supervision;with rolling walker PT Goal: Ambulate - Progress: Met Pt will Go Up / Down Stairs: 3-5 stairs;with least restrictive assistive device;with min assist  Visit Information  Last PT Received On: 06/25/12 Assistance Needed: +1    Subjective Data  Subjective: This (walking) feels good! Patient Stated Goal: Be able to go back to work   Cognition  Cognition Arousal/Alertness: Awake/alert Behavior During Therapy: WFL for tasks assessed/performed Overall Cognitive Status: Within Functional Limits for tasks assessed    Balance     End of Session PT - End of Session Activity Tolerance: Patient tolerated treatment well Patient left: in chair;with call bell/phone within reach;with family/visitor present Nurse Communication: Mobility status   GP     [name] 06/25/2012, 12:17 PM [phone]

## 2012-06-25 NOTE — Progress Notes (Signed)
Clinical Social Work Department BRIEF PSYCHOSOCIAL ASSESSMENT 06/25/2012  Patient:  [name], [name]     Account Number:  [id number]     Admit date:  06/23/2012  Clinical Social Worker:  Doroteo Glassman  Date/Time:  06/25/2012 04:15 PM  Referred by:  Physician  Date Referred:  06/25/2012 Referred for  SNF Placement   Other Referral:   Interview type:  Patient Other interview type:    PSYCHOSOCIAL DATA Living Status:  ALONE Admitted from facility:   Level of care:   Primary support name:  Ruthe Mannan Primary support relationship to patient:  FRIEND Degree of support available:   unknown    CURRENT CONCERNS Current Concerns  Post-Acute Placement   Other Concerns:    SOCIAL WORK ASSESSMENT / PLAN Met with Pt to discuss d/c plans.    Pt aware that MD recommends SNF and Pt in agreement.  Pt stated that he would like to go back to [facility], as he was there for 4 months and he received good care there.  Pt not interested in either of the GLs, as he did not receive good care at [facility].  He agreed to allow CSW to send his information to all SNFs in [place]    CSW thanked Pt for his time.   Assessment/plan status:  Psychosocial Support/Ongoing Assessment of Needs Other assessment/ plan:   Information/referral to community resources:   declined--only wants [facility].    PATIENT'S/FAMILY'S RESPONSE TO PLAN OF CARE: Pt thanked CSW for time and assistance.   Providence Crosby, LCSWA Clinical Social Work [phone]

## 2012-06-25 NOTE — Care Management Note (Addendum)
    Page 1 of 2   06/27/2012     3:52:54 PM   CARE MANAGEMENT NOTE 06/27/2012  Patient:  [name], [name]   Account Number:  [id number]  Date Initiated:  06/25/2012  Documentation initiated by:  Lanier Clam  Subjective/Objective Assessment:   ADMITTED W/LTKA REVISION.   [place]'s comp claim #[id number]  DOI-05/04/1997  Contact person-Ruth [phone]     Action/Plan:   FROM HOME   Anticipated DC Date:  06/26/2012   Anticipated DC Plan:  SKILLED NURSING FACILITY      DC Planning Services  CM consult      Choice offered to / List presented to:          [facility] arranged  HH - 11 Patient Refused      Status of service:  Completed, signed off Medicare Important Message given?   (If response is "NO", the following Medicare IM given date fields will be blank) Date Medicare IM given:   Date Additional Medicare IM given:    Discharge Disposition:  HOME/SELF CARE  Per UR Regulation:  Reviewed for med. necessity/level of care/duration of stay  If discussed at Long Length of Stay Meetings, dates discussed:    Comments:  06/27/2012 Aerie Donica bsn rn ccm [phone] Return call from Elpidio Anis who states outpatient physical therapy has been authorized for [facility]. TCT to [facility] who requested that prescription for PT services be faxed to [phone]. Face sheet and patient contact phone numbers also faxed with confirmation. [facility](Julie) also requested that patient call then to set up time for first  appt. I advised patient and he voices understanding regarding outpatient physical therapy services.  06/26/2012 Colleen Can BSN RN CCM [phone] Plans have changed to home upon discharge. CM spoke with patient regarding discharge plans. Pt plans to return to his [place] States he will have support from friends and neighbors. Pt already has RW, cane, adaptive equipment and elevated tiolet seat. Preferes to go to outpatient physical  therapy after discharge. CM has caqlled worker's comp case manager-Ruth [phone], who will arrange for outpatient physical therapy for patient. She will need orders from  physician regarding frequency and duration of needed outpatient PT. Will fax op note, H&P, PT notes-fax [phone]. CM will follow.   06/25/12 KATHY MAHABIR RN,BSN NCM WEEKEND [phone] PT-[email or web address].CSW FOLLOWING.

## 2012-06-25 NOTE — Progress Notes (Signed)
Physical Therapy Treatment Patient Details Name: [name] MRN: [id number] DOB: 1950/07/19 Today's Date: 06/25/2012 Time: 4098-1191 PT Time Calculation (min): 42 min  PT Assessment / Plan / Recommendation Comments on Treatment Session  Progressing well with mobility. Pt ambulated 200' with RW, ther ex for L TKA completed. Knee flexion 85* AAROM.     Follow Up Recommendations  SNF     Does the patient have the potential to tolerate intense rehabilitation     Barriers to Discharge        Equipment Recommendations  None recommended by PT    Recommendations for Other Services OT consult  Frequency 7X/week   Plan Discharge plan remains appropriate    Precautions / Restrictions Precautions Precautions: Fall Restrictions Weight Bearing Restrictions: No Other Position/Activity Restrictions: WBAT   Pertinent Vitals/Pain *3/10 L knee with walking Premedicated, ice applied**    Mobility  Bed Mobility Bed Mobility: Not assessed Transfers Transfers: Sit to Stand;Stand to Sit Sit to Stand: With armrests;With upper extremity assist;From chair/3-in-1;6: Modified independent (Device/Increase time) Stand to Sit: To chair/3-in-1;With upper extremity assist;With armrests;6: Modified independent (Device/Increase time) Ambulation/Gait Ambulation/Gait Assistance: 5: Supervision Ambulation Distance (Feet): 200 Feet Assistive device: Rolling walker Gait Pattern: Step-to pattern;Antalgic General Gait Details: steady, no LOB Stairs: No    Exercises Total Joint Exercises Ankle Circles/Pumps: AROM;Both;15 reps;Supine Quad Sets: AROM;10 reps;Both;Supine Short Arc Quad: AROM;Left;20 reps Heel Slides: AAROM;Supine;Left;15 reps Hip ABduction/ADduction: AROM;Left;15 reps Straight Leg Raises: AROM;Left;Supine;20 reps Long Arc Quad: AROM;20 reps;Left;Seated Knee Flexion: AAROM;Left;10 reps;Seated Goniometric ROM: flexion L knee AAROM 85*, ext 0*   PT Diagnosis:    PT Problem List:   PT  Treatment Interventions:     PT Goals Acute Rehab PT Goals PT Goal Formulation: With patient Time For Goal Achievement: 07/07/12 Potential to Achieve Goals: Good Pt will go Supine/Side to Sit: with supervision Pt will go Sit to Supine/Side: with supervision Pt will go Sit to Stand: with supervision PT Goal: Sit to Stand - Progress: Met Pt will go Stand to Sit: with supervision PT Goal: Stand to Sit - Progress: Met Pt will Ambulate: >150 feet;with supervision;with rolling walker PT Goal: Ambulate - Progress: Met Pt will Go Up / Down Stairs: 3-5 stairs;with least restrictive assistive device;with min assist  Visit Information  Last PT Received On: 06/25/12 Assistance Needed: +1    Subjective Data  Subjective: I want to walk farther today.  Patient Stated Goal: Be able to go back to work   Cognition  Cognition Arousal/Alertness: Awake/alert Behavior During Therapy: WFL for tasks assessed/performed Overall Cognitive Status: Within Functional Limits for tasks assessed    Balance     End of Session PT - End of Session Activity Tolerance: Patient tolerated treatment well Patient left: in chair;with call bell/phone within reach Nurse Communication: Mobility status   GP     [name] 06/25/2012, 8:30 AM [phone]

## 2012-06-25 NOTE — Progress Notes (Signed)
   Subjective: 2 Days Post-Op Procedure(s) (LRB): REIMPLANTATION OF LEFT TOTAL KNEE (Left) Patient reports pain as mild.   Patient seen in rounds with Dr. Darrelyn Hillock. Patient is well, and has had no acute complaints or problems. No issues overnight. No complaints of shortness of breath or chest pain. He reports that he is doing well this morning and is confident in his progress with therapy.    Objective: Vital signs in last 24 hours: Temp:  [97.8 F (36.6 C)-99.7 F (37.6 C)] 98.2 F (36.8 C) (05/18 0505) Pulse Rate:  [79-101] 81 (05/18 0505) Resp:  [16-24] 24 (05/18 0505) BP: (99-123)/(59-75) 107/67 mmHg (05/18 0505) SpO2:  [92 %-100 %] 100 % (05/18 0505)  Intake/Output from previous day:  Intake/Output Summary (Last 24 hours) at 06/25/12 0759 Last data filed at 06/25/12 0505  Gross per 24 hour  Intake      0 ml  Output   2595 ml  Net  -2595 ml     Labs:  Recent Labs  06/24/12 0527 06/25/12 0520  HGB 10.1* 9.8*    Recent Labs  06/24/12 0527 06/25/12 0520  WBC 7.5 7.4  RBC 3.50* 3.32*  HCT 32.6* 30.9*  PLT 188 200    Recent Labs  06/24/12 0527 06/25/12 0520  NA 137 135  K 4.5 4.5  CL 104 102  CO2 28 25  BUN 23 16  CREATININE 1.04 0.81  GLUCOSE 153* 137*  CALCIUM 9.2 9.4    EXAM General - Patient is Alert and Oriented Extremity - Neurologically intact Dorsiflexion/Plantar flexion intact No cellulitis present Compartment soft Dressing/Incision - clean, dry, no drainage Motor Function - intact, moving foot and toes well on exam.   Past Medical History  Diagnosis Date  . Complication of anesthesia     PT STATES HARD TO WAKE UP AFTER ONE SUGERY -STATES THE SURGERY TOOK LONGER THAN EXPECTED.  NO PROBLEMS WITH ANY OTHER SURGERY  . Hypothyroidism   . Shortness of breath     WITH EXERTION AND PAIN  . Septic arthritis of knee     LEFT KNEE  . Pain     BACK PAIN - PT ATTRIBUTES TO THE WAY HE WALKS DUE TO LEFT KNEE PROBLEM    Assessment/Plan: 2  Days Post-Op Procedure(s) (LRB): REIMPLANTATION OF LEFT TOTAL KNEE (Left) Principal Problem:   S/P left TK revision  Estimated body mass index is 58.55 kg/(m^2) as calculated from the following:   Height as of this encounter: 5\' 8"  (1.727 m).   Weight as of this encounter: 174.635 kg (385 lb). Advance diet Up with therapy  DVT Prophylaxis - Xarelto Weight-Bearing as tolerated   Patient is progressing well. Will get social work consult as patient may need SNF placement as he as no family at home. Patient reports that he may be able to make arrangements for a family member or friend to help him at home.   Kylan Liberati LAUREN 06/25/2012, 7:59 AM

## 2012-06-26 ENCOUNTER — Encounter (HOSPITAL_COMMUNITY): Payer: Self-pay | Admitting: Orthopedic Surgery

## 2012-06-26 NOTE — Progress Notes (Signed)
Patient ID: [name], male   DOB: 09/20/1950, 62 y.o.   MRN: [id number] Subjective: 3 Days Post-Op Procedure(s) (LRB): REIMPLANTATION OF LEFT TOTAL KNEE (Left)    Patient reports pain as mild.  Pain significantly better than before the operation  Objective:   VITALS:   Filed Vitals:   06/26/12 0825  BP:   Pulse:   Temp: 98 F (36.7 C)  Resp:     Neurovascular intact Incision: dressing C/D/I  LABS  Recent Labs  06/24/12 0527 06/25/12 0520  HGB 10.1* 9.8*  HCT 32.6* 30.9*  WBC 7.5 7.4  PLT 188 200     Recent Labs  06/24/12 0527 06/25/12 0520  NA 137 135  K 4.5 4.5  BUN 23 16  CREATININE 1.04 0.81  GLUCOSE 153* 137*    No results found for this basename: LABPT, INR,  in the last 72 hours   Assessment/Plan: 3 Days Post-Op Procedure(s) (LRB): REIMPLANTATION OF LEFT TOTAL KNEE (Left)   Up with therapy Plan for discharge tomorrow  Patient wishes to go straight to outpatient therapy as opposed to in house therapy, will have social work team finalize outpatient details

## 2012-06-26 NOTE — Progress Notes (Signed)
Per RN, pt currently wearing 2l [place] and refuses to wear cpap tonight.  Pt stated the cpap kept him up all night.  RN was advised to call should he need further assistance.

## 2012-06-26 NOTE — Progress Notes (Signed)
CM spoke with patient regarding discharge plans. Plans are for him to return to his home in [place] ([place] States he will have support from friends and neighbors. Pt already has RW, cane, adaptive equipment, elevated toilet seat. Prefers to go to outpatient physical therapy after discharge. CM has called worker's comp case manager-Ruth Fatham-([phone]) Who will arrange for outpatient physical therapy for patient. She will need orders from physician regarding frequency and duration of needed  Outpatient. Will fax op note, h&P, face sheet, PT notes to [phone]. CM will follow.

## 2012-06-26 NOTE — Progress Notes (Signed)
Spoke with pt regarding cpap tonight & pt refuses to wear. He said it kept him up all night the other night & he doesn't want to wear anymore.  Jacqulynn Cadet RRT

## 2012-06-26 NOTE — Progress Notes (Signed)
Patient pain level well maintained, refusing scheduled pain medication, ambulated 100 feet down hall with walker, will continue to monitor patient and document progress

## 2012-06-26 NOTE — Progress Notes (Signed)
Physical Therapy Treatment Patient Details Name: [name] MRN: [id number] DOB: 1950/07/20 Today's Date: 06/26/2012 Time: 4098-1191 PT Time Calculation (min): 24 min  PT Assessment / Plan / Recommendation Comments on Treatment Session  Pt ambulated 450' with RW independently, stair training completed. PT goals met, OK to DC home from PT standpoint.     Follow Up Recommendations  Home health PT     Does the patient have the potential to tolerate intense rehabilitation     Barriers to Discharge        Equipment Recommendations  None recommended by PT    Recommendations for Other Services OT consult  Frequency 7X/week   Plan Discharge plan needs to be updated    Precautions / Restrictions Precautions Precautions: Fall Restrictions Weight Bearing Restrictions: No Other Position/Activity Restrictions: WBAT   Pertinent Vitals/Pain **5/10 L knee after PT, pain meds requested Ice applied*    Mobility  Bed Mobility Bed Mobility: Not assessed Details for Bed Mobility Assistance: pt independent with supine to sit per pt report, pt up in chair at start of PT today Transfers Transfers: Sit to Stand;Stand to Sit Sit to Stand: With armrests;With upper extremity assist;From chair/3-in-1;6: Modified independent (Device/Increase time) Stand to Sit: To chair/3-in-1;With upper extremity assist;With armrests;6: Modified independent (Device/Increase time) Ambulation/Gait Ambulation/Gait Assistance: 6: Modified independent (Device/Increase time) Ambulation Distance (Feet): 450 Feet Assistive device: Rolling walker Gait Pattern: Step-to pattern General Gait Details: steady, no LOB Stairs: Yes Stairs Assistance: 5: Supervision Stairs Assistance Details (indicate cue type and reason): VCs for placement of cane  Stair Management Technique: One rail Right;Forwards;With cane Number of Stairs: 3    Exercises Total Joint Exercises Ankle Circles/Pumps: AROM;Both;15 reps;Supine Towel  Squeeze: AROM;Both;10 reps Short Arc Quad: AROM;Left;10 reps Heel Slides: AAROM;10 reps;Supine;Left Straight Leg Raises: AROM;AAROM;Left;10 reps;Supine Long Arc Quad: AROM;Left;10 reps;Seated Knee Flexion: AAROM;Left;10 reps;Seated Goniometric ROM: L knee flexion AAROM 85*, ext 0*   PT Diagnosis:    PT Problem List:   PT Treatment Interventions:     PT Goals Acute Rehab PT Goals PT Goal Formulation: With patient Time For Goal Achievement: 07/07/12 Potential to Achieve Goals: Good Pt will go Supine/Side to Sit: with supervision PT Goal: Supine/Side to Sit - Progress: Met Pt will go Sit to Supine/Side: with supervision PT Goal: Sit to Supine/Side - Progress: Met Pt will go Sit to Stand: with modified independence PT Goal: Sit to Stand - Progress: Met Pt will go Stand to Sit: with modified independence PT Goal: Stand to Sit - Progress: Met Pt will Ambulate: >150 feet;with supervision;with rolling walker PT Goal: Ambulate - Progress: Met Pt will Go Up / Down Stairs: 3-5 stairs;with least restrictive assistive device;with min assist PT Goal: Up/Down Stairs - Progress: Met  Visit Information  Last PT Received On: 06/26/12 Assistance Needed: +1    Subjective Data  Subjective: Why can't I drive? Patient Stated Goal: Be able to go back to work   Cognition  Cognition Arousal/Alertness: Awake/alert Behavior During Therapy: WFL for tasks assessed/performed Overall Cognitive Status: Within Functional Limits for tasks assessed    Balance     End of Session PT - End of Session Activity Tolerance: Patient tolerated treatment well Patient left: in chair;with call bell/phone within reach;with family/visitor present Nurse Communication: Mobility status   GP     [name] 06/26/2012, 9:20 AM [phone]

## 2012-06-27 DIAGNOSIS — D500 Iron deficiency anemia secondary to blood loss (chronic): Secondary | ICD-10-CM

## 2012-06-27 DIAGNOSIS — E669 Obesity, unspecified: Secondary | ICD-10-CM | POA: Diagnosis present

## 2012-06-27 MED ORDER — OXYCODONE HCL 5 MG PO TABS: 5.0000 mg | ORAL_TABLET | ORAL | Status: DC | PRN

## 2012-06-27 MED ORDER — RIVAROXABAN 10 MG PO TABS: 10.0000 mg | ORAL_TABLET | ORAL | Status: DC

## 2012-06-27 MED ORDER — POLYETHYLENE GLYCOL 3350 17 G PO PACK: 17.0000 g | PACK | Freq: Two times a day (BID) | ORAL | Status: DC

## 2012-06-27 MED ORDER — AMOXICILLIN-POT CLAVULANATE 875-125 MG PO TABS: 1.0000 | ORAL_TABLET | Freq: Two times a day (BID) | ORAL | Status: DC

## 2012-06-27 MED ORDER — DSS 100 MG PO CAPS: 100.0000 mg | ORAL_CAPSULE | Freq: Two times a day (BID) | ORAL | Status: DC

## 2012-06-27 MED ORDER — METHOCARBAMOL 500 MG PO TABS: 500.0000 mg | ORAL_TABLET | Freq: Four times a day (QID) | ORAL | Status: DC | PRN

## 2012-06-27 MED ORDER — FERROUS SULFATE 325 (65 FE) MG PO TABS: 325.0000 mg | ORAL_TABLET | Freq: Three times a day (TID) | ORAL | Status: DC

## 2012-06-27 NOTE — Progress Notes (Signed)
   Subjective: 4 Days Post-Op Procedure(s) (LRB): REIMPLANTATION OF LEFT TOTAL KNEE (Left)   Patient reports pain as mild, pain well controlled. No events throughout the night. Ready to be discharged home.  Objective:   VITALS:   Filed Vitals:   06/27/12 0537  BP: 114/73  Pulse: 93  Temp: 98 F (36.7 C)  Resp: 18    Neurovascular intact Dorsiflexion/Plantar flexion intact Incision: dressing C/D/I No cellulitis present Compartment soft  LABS  Recent Labs  06/25/12 0520  HGB 9.8*  HCT 30.9*  WBC 7.4  PLT 200     Recent Labs  06/25/12 0520  NA 135  K 4.5  BUN 16  CREATININE 0.81  GLUCOSE 137*     Assessment/Plan: 4 Days Post-Op Procedure(s) (LRB): REIMPLANTATION OF LEFT TOTAL KNEE (Left) Up with therapy Discharge home Follow up in 2 weeks [facility]. Follow up with OLIN,Baine Decesare D in 2 weeks.  Contact information:  [facility] [address] [place] [address] [phone]    Expected ABLA  Treated with iron and will observe  Morbid Obesity (BMI >40)  Estimated body mass index is 58.55 kg/(m^2) as calculated from the following:   Height as of this encounter: 5\' 8"  (1.727 m).   Weight as of this encounter: 174.635 kg (385 lb). Patient also counseled that weight may inhibit the healing process Patient counseled that losing weight will help with future health issues      Anastasio Auerbach. Lorynn Moeser   PAC  06/27/2012, 7:45 AM

## 2012-06-27 NOTE — Progress Notes (Signed)
Received orders for rw and commode.  Per CM, patients has both at home from previous surgery.  No DME needs at this time.

## 2012-06-27 NOTE — Progress Notes (Signed)
Physical Therapy Treatment Patient Details Name: [name] MRN: [id number] DOB: 10/10/1950 Today's Date: 06/27/2012 Time: 4098-1191 PT Time Calculation (min): 25 min  PT Assessment / Plan / Recommendation Comments on Treatment Session  POD # 4 L TKReimplantation.  Pt progressing well and plans to D/C to home today and cont his Rehab at Out Patient.      Follow Up Recommendations  Outpatient PT     Does the patient have the potential to tolerate intense rehabilitation     Barriers to Discharge        Equipment Recommendations  None recommended by PT    Recommendations for Other Services    Frequency     Plan Discharge plan remains appropriate    Precautions / Restrictions Precautions Precautions: Fall Restrictions Weight Bearing Restrictions: No LLE Weight Bearing: Weight bearing as tolerated   Pertinent Vitals/Pain C/o "soreness" ICE applied    Mobility  Bed Mobility Bed Mobility: Not assessed Details for Bed Mobility Assistance: Pt OOB in recliner Transfers Transfers: Sit to Stand;Stand to Sit Sit to Stand: 6: Modified independent (Device/Increase time);From chair/3-in-1 Stand to Sit: 6: Modified independent (Device/Increase time);To chair/3-in-1 Details for Transfer Assistance: increased time Ambulation/Gait Ambulation/Gait Assistance: 6: Modified independent (Device/Increase time) Ambulation Distance (Feet): 470 Feet Assistive device: Rolling walker Ambulation/Gait Assistance Details: increased time Gait Pattern: Step-to pattern Stairs: Yes Stairs Assistance: 5: Supervision;4: Min guard Stairs Assistance Details (indicate cue type and reason): increased time Stair Management Technique: One rail Right;Forwards;With cane Number of Stairs: 4    PT Goals                                                          progressing    Visit Information  Last PT Received On: 06/27/12 Assistance Needed: +1    Subjective Data  Subjective: This aint my first  [place] Patient Stated Goal: home   Cognition    good   Balance   good  End of Session PT - End of Session Equipment Utilized During Treatment: Gait belt Activity Tolerance: Patient tolerated treatment well Patient left: in chair;with call bell/phone within reach;with family/visitor present Nurse Communication: Mobility status   Felecia Shelling  PTA [facility]  Acute  Rehab Pager      [phone]

## 2012-06-28 LAB — ANAEROBIC CULTURE: Gram Stain: NONE SEEN

## 2012-06-28 NOTE — Discharge Summary (Signed)
Physician Discharge Summary  Patient ID: [name] MRN: [id number] DOB/AGE: 13-Dec-1950 62 y.o.  Admit date: 06/23/2012 Discharge date: 06/27/2012   Procedures:  Procedure(s) (LRB): REIMPLANTATION OF LEFT TOTAL KNEE (Left)  Attending Physician:  Dr. Durene Romans   Admission Diagnoses:   Removal of antibiotic spacer and reimplantation of total knee arthroplasty  Discharge Diagnoses:  Principal Problem:   S/P left TK revision Active Problems:   Morbid obesity   Expected blood loss anemia  Past Medical History  Diagnosis Date  . Complication of anesthesia     PT STATES HARD TO WAKE UP AFTER ONE SUGERY -STATES THE SURGERY TOOK LONGER THAN EXPECTED.  NO PROBLEMS WITH ANY OTHER SURGERY  . Hypothyroidism   . Shortness of breath     WITH EXERTION AND PAIN  . Septic arthritis of knee     LEFT KNEE  . Pain     BACK PAIN - PT ATTRIBUTES TO THE WAY HE WALKS DUE TO LEFT KNEE PROBLEM    HPI: doing well, but now he has times of significant pain, states it feels as if there is something floating around in the knee. X-rays in the clinic show deteriorated bone with presumed antibiotic spacer. Pt has tried various conservative treatments which have failed to alleviate their symptoms. Various options are discussed with the patient. No guarantees are made, presumed or expressed. Risks, benefits and expectations were discussed with the patient. Patient understand the risks, benefits and expectations and wishes to proceed with surgery.   PCP: Aura Dials, MD   Discharged Condition: good  Hospital Course:  Patient underwent the above stated procedure on 06/23/2012. Patient tolerated the procedure well and brought to the recovery room in good condition and subsequently to the floor.  POD #1 BP: 109/59 ; Pulse: 86 ; Temp: 97.8 F (36.6 C) ; Resp: 18 Pt's foley was removed, as well as the hemovac drain removed. IV was changed to a saline lock. Patient reports pain as well controlled. Nausea  from last night has improved. Passing flatus. Tolerating PO's. Denies SOB, CP, OR calf pain. Well nourished. Alert and oriented. Mild left knee soreness. Calf soft and non-tender. Left LE neurovascularly intact. Dressing C/D/I. No spreading redness or drainage. Left knee drain d/c'ed patient tolerated well. Drain tip intact.  LABS  Basename  06/24/12    0527   HGB  10.1  HCT  32.6   POD #2  BP: 107/67 ; Pulse: 81 ; Temp: 98.2 F (36.8 C) ; Resp: 24 Patient is well, and has had no acute complaints or problems. No issues overnight. No complaints of shortness of breath or chest pain. He reports that he is doing well this morning and is confident in his progress with therapy.  Neurovascular intact, dorsiflexion/plantar flexion intact, incision: dressing C/D/I, no cellulitis present and compartment soft.   LABS  Basename  06/25/12    0520   HGB  9.8  HCT  30.9   POD #3  BP: 116/78 ; Pulse: 88 ; Temp: 97.7 F (36.5 C) ; Resp: 20 Patient reports pain as mild. Pain significantly better than before the operation Neurovascular intact, dorsiflexion/plantar flexion intact, incision: dressing C/D/I, no cellulitis present and compartment soft.   LABS   No new labs   POD #4  BP: 114/73 ; Pulse: 93 ; Temp: 98 F (36.7 C) ; Resp: 18 Patient reports pain as mild, pain well controlled. No events throughout the night. Ready to be discharged home. Neurovascular intact, dorsiflexion/plantar flexion  intact, incision: dressing C/D/I, no cellulitis present and compartment soft.   LABS   No new labs   Discharge Exam: General appearance: alert, cooperative and no distress Extremities: Homans sign is negative, no sign of DVT, no edema, redness or tenderness in the calves or thighs and no ulcers, gangrene or trophic changes  Disposition:   Home or Self Care with follow up in 2 weeks   Follow-up Information   Follow up with Shelda Pal, MD. Schedule an appointment as soon as possible for a visit in 2  weeks.   Contact information:   [address] [place] [address] [place] [address] [phone]       Discharge Orders   Future Appointments Provider Department Dept Phone   08/24/2012 9:30 AM Randall Hiss, MD [facility] [phone]   Future Orders Complete By Expires     Call MD / Call 911  As directed     Comments:      If you experience chest pain or shortness of breath, CALL 911 and be transported to the hospital emergency room.  If you develope a fever above 101 F, pus (white drainage) or increased drainage or redness at the wound, or calf pain, call your surgeon's office.    Change dressing  As directed     Comments:      Maintain surgical dressing for 10-14 days, then change the dressing daily with sterile 4 x 4 inch gauze dressing and tape. Keep the area dry and clean.    Constipation Prevention  As directed     Comments:      Drink plenty of fluids.  Prune juice may be helpful.  You may use a stool softener, such as Colace (over the counter) 100 mg twice a day.  Use MiraLax (over the counter) for constipation as needed.    Diet - low sodium heart healthy  As directed     Discharge instructions  As directed     Comments:      If Aquacel then maintain surgical dressing for 10-14 days, then replace with gauze and tape, otherwise replace daily with guaze and tape. Keep the area dry and clean until follow up. Follow up in 2 weeks at [facility]. Call with any questions or concerns.    Increase activity slowly as tolerated  As directed     TED hose  As directed     Comments:      Use stockings (TED hose) for 2 weeks on both leg(s).  You may remove them at night for sleeping.    Weight bearing as tolerated  As directed          Medication List    STOP taking these medications       aspirin EC 81 MG tablet     oxyCODONE-acetaminophen 7.5-325 MG per tablet  Commonly known as:  PERCOCET      TAKE these medications        amoxicillin-clavulanate 875-125 MG per tablet  Commonly known as:  AUGMENTIN  Take 1 tablet by mouth 2 (two) times daily.     bismuth subsalicylate 262 MG/15ML suspension  Commonly known as:  PEPTO BISMOL  Take 15 mLs by mouth every 6 (six) hours as needed (diarrhea).     diphenhydrAMINE 25 mg capsule  Commonly known as:  BENADRYL  Take 25 mg by mouth daily as needed for allergies.     DSS 100 MG Caps  Take 100  mg by mouth 2 (two) times daily.     ferrous sulfate 325 (65 FE) MG tablet  Take 1 tablet (325 mg total) by mouth 3 (three) times daily after meals.     levothyroxine 100 MCG tablet  Commonly known as:  SYNTHROID, LEVOTHROID  Take 100 mcg by mouth daily before breakfast.     methocarbamol 500 MG tablet  Commonly known as:  ROBAXIN  Take 1 tablet (500 mg total) by mouth every 6 (six) hours as needed (muscle spasms).     multivitamin with minerals Tabs  Take 1 tablet by mouth daily.     oxyCODONE 5 MG immediate release tablet  Commonly known as:  Oxy IR/ROXICODONE  Take 1-3 tablets (5-15 mg total) by mouth every 4 (four) hours as needed for pain.     polyethylene glycol packet  Commonly known as:  MIRALAX / GLYCOLAX  Take 17 g by mouth 2 (two) times daily.     rivaroxaban 10 MG Tabs tablet  Commonly known as:  XARELTO  Take 1 tablet (10 mg total) by mouth daily.     Saw Palmetto (Serenoa repens) 320 MG Caps  Take 320 mg by mouth 2 (two) times daily.         Signed: Anastasio Auerbach. Cathrine Krizan   PAC  06/28/2012, 7:40 PM

## 2012-08-24 ENCOUNTER — Ambulatory Visit: Payer: Self-pay | Admitting: Infectious Disease

## 2012-08-26 ENCOUNTER — Encounter (HOSPITAL_COMMUNITY): Payer: Self-pay | Admitting: *Deleted

## 2012-08-26 ENCOUNTER — Inpatient Hospital Stay (HOSPITAL_COMMUNITY)
Admission: EM | Admit: 2012-08-26 | Discharge: 2012-09-02 | DRG: 308 | Disposition: A | Discharge status: Home / Self Care | Payer: Medicare Other | Attending: Cardiovascular Disease | Admitting: Cardiovascular Disease

## 2012-08-26 ENCOUNTER — Emergency Department (HOSPITAL_COMMUNITY): Payer: Medicare Other

## 2012-08-26 ENCOUNTER — Inpatient Hospital Stay (HOSPITAL_COMMUNITY): Payer: Medicare Other

## 2012-08-26 DIAGNOSIS — E119 Type 2 diabetes mellitus without complications: Secondary | ICD-10-CM

## 2012-08-26 DIAGNOSIS — I4891 Unspecified atrial fibrillation: Principal | ICD-10-CM | POA: Diagnosis present

## 2012-08-26 DIAGNOSIS — R42 Dizziness and giddiness: Secondary | ICD-10-CM | POA: Diagnosis present

## 2012-08-26 DIAGNOSIS — R0602 Shortness of breath: Secondary | ICD-10-CM | POA: Diagnosis present

## 2012-08-26 DIAGNOSIS — R0609 Other forms of dyspnea: Secondary | ICD-10-CM | POA: Diagnosis present

## 2012-08-26 DIAGNOSIS — Z96659 Presence of unspecified artificial knee joint: Secondary | ICD-10-CM

## 2012-08-26 DIAGNOSIS — Z86718 Personal history of other venous thrombosis and embolism: Secondary | ICD-10-CM

## 2012-08-26 DIAGNOSIS — R634 Abnormal weight loss: Secondary | ICD-10-CM | POA: Diagnosis present

## 2012-08-26 DIAGNOSIS — E039 Hypothyroidism, unspecified: Secondary | ICD-10-CM | POA: Diagnosis present

## 2012-08-26 DIAGNOSIS — I5030 Unspecified diastolic (congestive) heart failure: Secondary | ICD-10-CM | POA: Diagnosis present

## 2012-08-26 DIAGNOSIS — K219 Gastro-esophageal reflux disease without esophagitis: Secondary | ICD-10-CM

## 2012-08-26 DIAGNOSIS — Z6841 Body Mass Index (BMI) 40.0 and over, adult: Secondary | ICD-10-CM

## 2012-08-26 DIAGNOSIS — I5031 Acute diastolic (congestive) heart failure: Secondary | ICD-10-CM | POA: Diagnosis present

## 2012-08-26 DIAGNOSIS — E118 Type 2 diabetes mellitus with unspecified complications: Secondary | ICD-10-CM | POA: Diagnosis present

## 2012-08-26 DIAGNOSIS — I2789 Other specified pulmonary heart diseases: Secondary | ICD-10-CM | POA: Diagnosis present

## 2012-08-26 DIAGNOSIS — R0989 Other specified symptoms and signs involving the circulatory and respiratory systems: Secondary | ICD-10-CM | POA: Diagnosis present

## 2012-08-26 DIAGNOSIS — I4949 Other premature depolarization: Secondary | ICD-10-CM | POA: Diagnosis present

## 2012-08-26 DIAGNOSIS — I509 Heart failure, unspecified: Secondary | ICD-10-CM | POA: Diagnosis present

## 2012-08-26 DIAGNOSIS — R0789 Other chest pain: Secondary | ICD-10-CM | POA: Diagnosis present

## 2012-08-26 DIAGNOSIS — E669 Obesity, unspecified: Secondary | ICD-10-CM | POA: Diagnosis present

## 2012-08-26 DIAGNOSIS — R000 Tachycardia, unspecified: Secondary | ICD-10-CM | POA: Diagnosis present

## 2012-08-26 DIAGNOSIS — I4819 Other persistent atrial fibrillation: Secondary | ICD-10-CM | POA: Diagnosis present

## 2012-08-26 LAB — CBC
HCT: 35.6 % — ABNORMAL LOW (ref 39.0–52.0)
MCHC: 30.9 g/dL (ref 30.0–36.0)
RDW: 15.8 % — ABNORMAL HIGH (ref 11.5–15.5)
WBC: 5.9 10*3/uL (ref 4.0–10.5)

## 2012-08-26 LAB — URINE MICROSCOPIC-ADD ON

## 2012-08-26 LAB — BASIC METABOLIC PANEL
BUN: 21 mg/dL (ref 6–23)
Chloride: 107 mEq/L (ref 96–112)
GFR calc Af Amer: 85 mL/min — ABNORMAL LOW (ref >90)
GFR calc non Af Amer: 73 mL/min — ABNORMAL LOW (ref >90)
Potassium: 3.8 mEq/L (ref 3.5–5.1)
Sodium: 140 mEq/L (ref 135–145)

## 2012-08-26 LAB — RAPID URINE DRUG SCREEN, HOSP PERFORMED
Cocaine: NOT DETECTED
Opiates: NOT DETECTED
Tetrahydrocannabinol: NOT DETECTED

## 2012-08-26 LAB — PHOSPHORUS: Phosphorus: 3.9 mg/dL (ref 2.3–4.6)

## 2012-08-26 LAB — URINALYSIS, ROUTINE W REFLEX MICROSCOPIC
Glucose, UA: NEGATIVE mg/dL
Ketones, ur: NEGATIVE mg/dL
Leukocytes, UA: NEGATIVE
Nitrite: NEGATIVE
Specific Gravity, Urine: >1.046 — ABNORMAL HIGH (ref 1.005–1.030)
pH: 5.5 (ref 5.0–8.0)

## 2012-08-26 LAB — APTT: aPTT: 30 seconds (ref 24–37)

## 2012-08-26 LAB — MRSA PCR SCREENING: MRSA by PCR: NEGATIVE

## 2012-08-26 LAB — T4, FREE: Free T4: 1.48 ng/dL (ref 0.80–1.80)

## 2012-08-26 LAB — TSH: TSH: 4.147 u[IU]/mL (ref 0.350–4.500)

## 2012-08-26 LAB — TROPONIN I: Troponin I: <0.3 ng/mL (ref <0.30)

## 2012-08-26 MED ORDER — IOHEXOL 350 MG/ML SOLN
100.0000 mL | Freq: Once | INTRAVENOUS | Status: AC | PRN
  Administered 2012-08-26: 100 mL via INTRAVENOUS

## 2012-08-26 MED ORDER — DILTIAZEM LOAD VIA INFUSION
10.0000 mg | Freq: Once | INTRAVENOUS | Status: AC
  Administered 2012-08-26: 10 mg via INTRAVENOUS
  Filled 2012-08-26: qty 10

## 2012-08-26 MED ORDER — ONDANSETRON HCL 4 MG PO TABS: 4.0000 mg | ORAL_TABLET | Freq: Four times a day (QID) | ORAL | Status: DC | PRN

## 2012-08-26 MED ORDER — HEPARIN (PORCINE) IN NACL 100-0.45 UNIT/ML-% IJ SOLN
1600.0000 [IU]/h | INTRAMUSCULAR | Status: DC
  Administered 2012-08-26: 1600 [IU]/h via INTRAVENOUS
  Filled 2012-08-26 (×2): qty 250

## 2012-08-26 MED ORDER — ACETAMINOPHEN 650 MG RE SUPP: 650.0000 mg | Freq: Four times a day (QID) | RECTAL | Status: DC | PRN

## 2012-08-26 MED ORDER — ADULT MULTIVITAMIN W/MINERALS CH
1.0000 | ORAL_TABLET | Freq: Every day | ORAL | Status: DC
  Administered 2012-08-27 – 2012-09-02 (×7): 1 via ORAL
  Filled 2012-08-26 (×7): qty 1

## 2012-08-26 MED ORDER — DILTIAZEM HCL 100 MG IV SOLR
5.0000 mg/h | INTRAVENOUS | Status: DC
  Administered 2012-08-26: 15 mg/h via INTRAVENOUS
  Administered 2012-08-26 (×2): 5 mg/h via INTRAVENOUS
  Administered 2012-08-27: 15 mg/h via INTRAVENOUS
  Filled 2012-08-26 (×2): qty 100

## 2012-08-26 MED ORDER — PANTOPRAZOLE SODIUM 40 MG PO TBEC
40.0000 mg | DELAYED_RELEASE_TABLET | Freq: Every day | ORAL | Status: DC
  Administered 2012-08-27 – 2012-09-02 (×5): 40 mg via ORAL
  Filled 2012-08-26 (×7): qty 1

## 2012-08-26 MED ORDER — LEVOTHYROXINE SODIUM 100 MCG PO TABS
100.0000 ug | ORAL_TABLET | Freq: Every day | ORAL | Status: DC
  Administered 2012-08-27 – 2012-09-02 (×7): 100 ug via ORAL
  Filled 2012-08-26 (×8): qty 1

## 2012-08-26 MED ORDER — ACETAMINOPHEN 325 MG PO TABS
650.0000 mg | ORAL_TABLET | Freq: Four times a day (QID) | ORAL | Status: DC | PRN
  Administered 2012-08-27 (×2): 650 mg via ORAL
  Filled 2012-08-26 (×2): qty 2

## 2012-08-26 MED ORDER — INSULIN ASPART 100 UNIT/ML ~~LOC~~ SOLN
0.0000 [IU] | Freq: Three times a day (TID) | SUBCUTANEOUS | Status: DC
  Administered 2012-09-01: 1 [IU] via SUBCUTANEOUS

## 2012-08-26 MED ORDER — ONDANSETRON HCL 4 MG/2ML IJ SOLN: 4.0000 mg | Freq: Four times a day (QID) | INTRAMUSCULAR | Status: DC | PRN

## 2012-08-26 MED ORDER — HYDROMORPHONE HCL PF 1 MG/ML IJ SOLN
1.0000 mg | INTRAMUSCULAR | Status: DC | PRN
  Administered 2012-08-27 – 2012-08-30 (×5): 1 mg via INTRAVENOUS
  Filled 2012-08-26 (×5): qty 1

## 2012-08-26 MED ORDER — HEPARIN BOLUS VIA INFUSION
5600.0000 [IU] | Freq: Once | INTRAVENOUS | Status: AC
  Administered 2012-08-26: 5600 [IU] via INTRAVENOUS
  Filled 2012-08-26: qty 5600

## 2012-08-26 MED ORDER — SODIUM CHLORIDE 0.9 % IV SOLN
INTRAVENOUS | Status: DC
  Administered 2012-08-26: 17:00:00 via INTRAVENOUS

## 2012-08-26 NOTE — Progress Notes (Signed)
ANTICOAGULATION CONSULT NOTE - Initial Consult  Pharmacy Consult for heparin Indication: atrial fibrillation  Allergies  Allergen Reactions  . Morphine     HIVES    Patient Measurements:  68 in 175kg IBW 68.4 Heparin Dosing Weight: 112kg  Vital Signs: Temp: 98.8 F (37.1 C) (07/19 1423) Temp src: Oral (07/19 1423) BP: 125/92 mmHg (07/19 1700) Pulse Rate: 114 (07/19 1700)  Labs:  Recent Labs  08/26/12 1430  HGB 11.0*  HCT 35.6*  PLT 254  CREATININE 1.07  TROPONINI <0.30    The CrCl is unknown because both a height and weight (above a minimum accepted value) are required for this calculation.   Medical History: Past Medical History  Diagnosis Date  . Complication of anesthesia     PT STATES HARD TO WAKE UP AFTER ONE SUGERY -STATES THE SURGERY TOOK LONGER THAN EXPECTED.  NO PROBLEMS WITH ANY OTHER SURGERY  . Hypothyroidism   . Shortness of breath     WITH EXERTION AND PAIN  . Septic arthritis of knee     LEFT KNEE  . Pain     BACK PAIN - PT ATTRIBUTES TO THE WAY HE WALKS DUE TO LEFT KNEE PROBLEM    Medications:  Scheduled:  . [START ON 08/27/2012] insulin aspart  0-9 Units Subcutaneous TID WC  . [START ON 08/27/2012] levothyroxine  100 mcg Oral QAC breakfast  . [START ON 08/27/2012] multivitamin with minerals  1 tablet Oral Daily  . [START ON 08/27/2012] pantoprazole  40 mg Oral Q1200   Infusions:  . sodium chloride    . diltiazem (CARDIZEM) infusion 5 mg/hr (08/26/12 1510)    Assessment: 62 YOM w/ new onset afib. Pharmacy asked to dose Heparin. Hx: DM, MO, Hypothyroid, knee surger 06/23/12, Hx DVT and PE. Goal of Therapy:  Heparin level 0.3-0.7 units/ml Monitor platelets by anticoagulation protocol: Yes   Plan:   STAT PT/INR and aPTT  Heparin bolus 5600 units then infusion @ 1600 units/hour  6 hour heparin level  Daily Heparin level  Daily CBC  Gwen Her PharmD  [phone] 08/26/2012 5:22 PM

## 2012-08-26 NOTE — Progress Notes (Signed)
Patient noted to have raised welted up area at IV site.  Patient complaining of some soreness but no pain at the site.  IV RN assessed, felt that patient was having allergic reaction to Cardizem.  MD notified.  Instructed to move to new IV site and assess site then.  IV RN attempting to get new IV access at this time.  Will continue to monitor.

## 2012-08-26 NOTE — ED Provider Notes (Addendum)
History    CSN: [id number] Arrival date & time 08/26/12  1421  First MD Initiated Contact with Patient 08/26/12 1435     Chief Complaint  Patient presents with  . Shortness of Breath  . Chest Pain   (Consider location/radiation/quality/duration/timing/severity/associated sxs/prior Treatment) Patient is a 62 y.o. male presenting with shortness of breath and chest pain. The history is provided by the patient (the pt complains of some sob).  Shortness of Breath Severity:  Moderate Onset quality:  Sudden Timing:  Constant Progression:  Waxing and waning Chronicity:  New Context: activity   Associated symptoms: chest pain   Associated symptoms: no abdominal pain, no cough, no headaches and no rash   Chest Pain Associated symptoms: shortness of breath   Associated symptoms: no abdominal pain, no back pain, no cough, no fatigue and no headache    Past Medical History  Diagnosis Date  . Complication of anesthesia     PT STATES HARD TO WAKE UP AFTER ONE SUGERY -STATES THE SURGERY TOOK LONGER THAN EXPECTED.  NO PROBLEMS WITH ANY OTHER SURGERY  . Hypothyroidism   . Shortness of breath     WITH EXERTION AND PAIN  . Septic arthritis of knee     LEFT KNEE  . Pain     BACK PAIN - PT ATTRIBUTES TO THE WAY HE WALKS DUE TO LEFT KNEE PROBLEM   Past Surgical History  Procedure Laterality Date  . Right knee arthroscopy  1998  . Left knee arthroscopy  1999  . Left knee surgery upper tibial ostomy    . Replacement left knee  2002  . Replacement right knee  2003  . Revision left knee cap   2004  . Left total knee removal for infection  2006  . Reimplantation left total knee   2006  . Removal left total knee   2008  . Reimplantation left total knee  2008  . 2010 removal of left total knee    . Hydrocelectoy   2012  . Reimplantation of total knee Left 06/23/2012    Procedure: REIMPLANTATION OF LEFT TOTAL KNEE;  Surgeon: Shelda Pal, MD;  Location: [facility];  Service: Orthopedics;   Laterality: Left;   No family history on file. History  Substance Use Topics  . Smoking status: Never Smoker   . Smokeless tobacco: Never Used  . Alcohol Use: Yes     Comment: RARE ALCOHOL    Review of Systems  Constitutional: Negative for appetite change and fatigue.  HENT: Negative for congestion, sinus pressure and ear discharge.   Eyes: Negative for discharge.  Respiratory: Positive for shortness of breath. Negative for cough.   Cardiovascular: Positive for chest pain.  Gastrointestinal: Negative for abdominal pain and diarrhea.  Genitourinary: Negative for frequency and hematuria.  Musculoskeletal: Negative for back pain.  Skin: Negative for rash.  Neurological: Negative for seizures and headaches.  Psychiatric/Behavioral: Negative for hallucinations.    Allergies  Morphine  Home Medications   Current Outpatient Rx  Name  Route  Sig  Dispense  Refill  . aspirin EC 81 MG tablet   Oral   Take 162 mg by mouth daily.         Marland Kitchen ibuprofen (ADVIL,MOTRIN) 200 MG tablet   Oral   Take 400 mg by mouth every 6 (six) hours as needed for pain.         Marland Kitchen levothyroxine (SYNTHROID, LEVOTHROID) 100 MCG tablet   Oral   Take 100 mcg by mouth  daily before breakfast.         . Multiple Vitamin (MULTIVITAMIN WITH MINERALS) TABS   Oral   Take 1 tablet by mouth daily.         . Pyridoxine HCl (VITAMIN B-6 PO)   Oral   Take 1 tablet by mouth daily.         . Saw Palmetto, Serenoa repens, 320 MG CAPS   Oral   Take 320 mg by mouth 2 (two) times daily.          BP 128/94  Pulse 102  Temp(Src) 98.8 F (37.1 C) (Oral)  Resp 23  SpO2 95% Physical Exam  Constitutional: He is oriented to person, place, and time. He appears well-developed.  HENT:  Head: Normocephalic.  Eyes: Conjunctivae and EOM are normal. No scleral icterus.  Neck: Neck supple. No thyromegaly present.  Cardiovascular: Exam reveals no gallop and no friction rub.   No murmur heard. Rapid irregular  rate  Pulmonary/Chest: No stridor. He has no wheezes. He has no rales. He exhibits no tenderness.  Abdominal: He exhibits no distension. There is no tenderness. There is no rebound.  Musculoskeletal: Normal range of motion. He exhibits no edema.  Lymphadenopathy:    He has no cervical adenopathy.  Neurological: He is oriented to person, place, and time. Coordination normal.  Skin: No rash noted. No erythema.  Psychiatric: He has a normal mood and affect. His behavior is normal.    ED Course  Procedures (including critical care time) Labs Reviewed  CBC - Abnormal; Notable for the following:    RBC 3.87 (*)    Hemoglobin 11.0 (*)    HCT 35.6 (*)    RDW 15.8 (*)    All other components within normal limits  BASIC METABOLIC PANEL - Abnormal; Notable for the following:    Glucose, Bld 106 (*)    GFR calc non Af Amer 73 (*)    GFR calc Af Amer 85 (*)    All other components within normal limits  TROPONIN I  TSH  T4, FREE   Dg Chest Port 1 View  08/26/2012   *RADIOLOGY REPORT*  Clinical Data: Shortness of breath and chest pain.  PORTABLE CHEST - 1 VIEW  Comparison: PA and lateral chest 06/14/2012 and 08/22/2009.  Findings: Lung volumes are low but the lungs are clear.  Heart size is upper normal.  No pneumothorax or pleural fluid.  IMPRESSION: No acute disease.   Original Report Authenticated By: [name] Dexter, M.D.   1. Atrial fibrillation    Date: 08/26/2012  Rate:140  Rhythm: atrial fibrillation  QRS Axis: normal  Intervals: normal  ST/T Wave abnormalities: normal  Conduction Disutrbances:none  Narrative Interpretation:   Old EKG Reviewed: none available   CRITICAL CARE Performed by: Janille Draughon L Total critical care time:35 Critical care time was exclusive of separately billable procedures and treating other patients. Critical care was necessary to treat or prevent imminent or life-threatening deterioration. Critical care was time spent personally by me on the  following activities: development of treatment plan with patient and/or surrogate as well as nursing, discussions with consultants, evaluation of patient's response to treatment, examination of patient, obtaining history from patient or surrogate, ordering and performing treatments and interventions, ordering and review of laboratory studies, ordering and review of radiographic studies, pulse oximetry and re-evaluation of patient's condition.    MDM    Benny Lennert, MD 08/26/12 1557  Benny Lennert, MD 08/26/12 [phone]

## 2012-08-26 NOTE — ED Notes (Signed)
Pt presents to ED with c/o SOB and substernal chest pain. Pt was seen at [facility] clinic and referred to ED with EKG and a note saying pt may have acute onset atrial fib.  Pt ambulated almost to ED and was seen looking faint by a Engineer, materials who got a wheel chair and brought him in.  Pt c/o being light headed since yesterday, but denies N/V.

## 2012-08-26 NOTE — ED Notes (Signed)
Patient transported to CT 

## 2012-08-26 NOTE — ED Notes (Signed)
Patient back from CT.

## 2012-08-26 NOTE — Consult Note (Signed)
[facility]       CONSULTATION NOTE   Reason for Consult: Atrial fibrillation  Requesting Physician: [place]  Cardiologist: Allyson Sabal  HPI: This is a 62 y.o. male with a past medical history significant for paroxysmal atrial fibrillation at the time of surgery for septic knee arthritis in 2012, but otherwise without known cardiac problems. Workup for atypical chest pain in 2002 with coronary angiography showed no evidence of coronary artery stenoses. Echocardiography in 2010 shows normal left ventricular systolic function in the absence of any meaningful vital abnormalities. Left atrium is mildly dilated, with end-systolic diameter 39 mm.  He presents with roughly 6 days of increasing shortness of breath. He has not been aware of palpitations. Today he was unable to participate in physical therapy due to shortness of breath. He went to see Dr. Mayford Knife in urgent care and was found to have atrial fibrillation rapid ventricular response and referred to the left lung emergency room. History Mr. breath has improved dramatically following initiation of rate control with intravenous diltiazem and he is currently asymptomatic. He denies any problems with chest pain at rest or with exertion, now or in the recent past. He has no history of dizziness or syncope. He has at most minimal ankle edema. He denies history of stroke TIA or other potential embolic events and has not had any bleeding problems.  He has multiple symptoms suggestive of obstructive sleep apnea including loud snoring daytime somnolence and is morbidly obese.  CT angiogram performed in the emergency room shows no evidence of pulmonary embolism but shows only artery dilatation consistent with pulmonary hypertension. There is also note made of coronary artery calcification which is especially prominent in the proximal LAD artery.  PMHx:  Past Medical History  Diagnosis Date  . Complication of anesthesia      PT STATES HARD TO WAKE UP AFTER ONE SUGERY -STATES THE SURGERY TOOK LONGER THAN EXPECTED.  NO PROBLEMS WITH ANY OTHER SURGERY  . Hypothyroidism   . Shortness of breath     WITH EXERTION AND PAIN  . Septic arthritis of knee     LEFT KNEE  . Pain     BACK PAIN - PT ATTRIBUTES TO THE WAY HE WALKS DUE TO LEFT KNEE PROBLEM   Past Surgical History  Procedure Laterality Date  . Right knee arthroscopy  1998  . Left knee arthroscopy  1999  . Left knee surgery upper tibial ostomy    . Replacement left knee  2002  . Replacement right knee  2003  . Revision left knee cap   2004  . Left total knee removal for infection  2006  . Reimplantation left total knee   2006  . Removal left total knee   2008  . Reimplantation left total knee  2008  . 2010 removal of left total knee    . Hydrocelectoy   2012  . Reimplantation of total knee Left 06/23/2012    Procedure: REIMPLANTATION OF LEFT TOTAL KNEE;  Surgeon: Shelda Pal, MD;  Location: [facility];  Service: Orthopedics;  Laterality: Left;    FAMHx: History reviewed. No pertinent family history.  SOCHx:  reports that he has never smoked. He has never used smokeless tobacco. He reports that  drinks alcohol. He reports that he does not use illicit drugs.  ALLERGIES: Allergies  Allergen Reactions  . Morphine     HIVES    ROS: Constitutional: negative for chills, fevers and night sweats  Eyes: negative for visual disturbance Ears, nose, mouth, throat, and face: positive for snoring Respiratory: positive for dyspnea on exertion, negative for cough, hemoptysis, sputum and wheezing Cardiovascular: positive for dyspnea, negative for chest pressure/discomfort, claudication, orthopnea, palpitations, paroxysmal nocturnal dyspnea and syncope Gastrointestinal: negative for abdominal pain, change in bowel habits, melena, nausea and vomiting Genitourinary:negative Integument/breast: negative Hematologic/lymphatic: negative for bleeding, easy bruising and  petechiae Musculoskeletal:positive for Chronic problems with recurrent pain in the knees especially the left side where he has had multiple surgery Neurological: negative for dizziness, gait problems, seizures, speech problems and weakness Behavioral/Psych: negative Endocrine: negative for diabetic symptoms including polydipsia and polyuria Allergic/Immunologic: negative  HOME MEDICATIONS: Prescriptions prior to admission  Medication Sig Dispense Refill  . aspirin EC 81 MG tablet Take 162 mg by mouth daily.      Marland Kitchen ibuprofen (ADVIL,MOTRIN) 200 MG tablet Take 400 mg by mouth every 6 (six) hours as needed for pain.      Marland Kitchen levothyroxine (SYNTHROID, LEVOTHROID) 100 MCG tablet Take 100 mcg by mouth daily before breakfast.      . Multiple Vitamin (MULTIVITAMIN WITH MINERALS) TABS Take 1 tablet by mouth daily.      . Pyridoxine HCl (VITAMIN B-6 PO) Take 1 tablet by mouth daily.      . Saw Palmetto, Serenoa repens, 320 MG CAPS Take 320 mg by mouth 2 (two) times daily.        HOSPITAL MEDICATIONS: Prior to Admission:  Prescriptions prior to admission  Medication Sig Dispense Refill  . aspirin EC 81 MG tablet Take 162 mg by mouth daily.      Marland Kitchen ibuprofen (ADVIL,MOTRIN) 200 MG tablet Take 400 mg by mouth every 6 (six) hours as needed for pain.      Marland Kitchen levothyroxine (SYNTHROID, LEVOTHROID) 100 MCG tablet Take 100 mcg by mouth daily before breakfast.      . Multiple Vitamin (MULTIVITAMIN WITH MINERALS) TABS Take 1 tablet by mouth daily.      . Pyridoxine HCl (VITAMIN B-6 PO) Take 1 tablet by mouth daily.      . Saw Palmetto, Serenoa repens, 320 MG CAPS Take 320 mg by mouth 2 (two) times daily.        VITALS: Blood pressure 125/92, pulse 114, temperature 98.8 F (37.1 C), temperature source Oral, resp. rate 20, SpO2 93.00%.  PHYSICAL EXAM: General appearance: alert, cooperative and morbidly obese Neck: no adenopathy, no carotid bruit, supple, symmetrical, trachea midline, thyroid not enlarged,  symmetric, no tenderness/mass/nodules and Jugular venous pulsations are impossible to assess Lungs: clear to auscultation bilaterally Heart: irregularly irregular rhythm, S1, S2 normal and No murmurs, rubs or gallops Abdomen: soft, non-tender; bowel sounds normal; no masses,  no organomegaly and Obesity precludes a detailed examination Extremities: extremities normal, atraumatic, no cyanosis or edema and Multiple scars surrounding left knee Pulses: 2+ and symmetric Skin: Skin color, texture, turgor normal. No rashes or lesions Neurologic: Alert and oriented X 3, normal strength and tone. Normal symmetric reflexes. Normal coordination and gait  LABS: Results for orders placed during the hospital encounter of 08/26/12 (from the past 48 hour(s))  CBC     Status: Abnormal   Collection Time    08/26/12  2:30 PM      Result Value Range   WBC 5.9  4.0 - 10.5 K/uL   RBC 3.87 (*) 4.22 - 5.81 MIL/uL   Hemoglobin 11.0 (*) 13.0 - 17.0 g/dL   HCT 16.1 (*) 09.6 - 04.5 %   MCV 92.0  78.0 - 100.0 fL   MCH 28.4  26.0 - 34.0 pg   MCHC 30.9  30.0 - 36.0 g/dL   RDW 46.9 (*) 62.9 - 52.8 %   Platelets 254  150 - 400 K/uL  BASIC METABOLIC PANEL     Status: Abnormal   Collection Time    08/26/12  2:30 PM      Result Value Range   Sodium 140  135 - 145 mEq/L   Potassium 3.8  3.5 - 5.1 mEq/L   Chloride 107  96 - 112 mEq/L   CO2 20  19 - 32 mEq/L   Glucose, Bld 106 (*) 70 - 99 mg/dL   BUN 21  6 - 23 mg/dL   Creatinine, Ser 4.13  0.50 - 1.35 mg/dL   Calcium 9.8  8.4 - 24.4 mg/dL   GFR calc non Af Amer 73 (*) >90 mL/min   GFR calc Af Amer 85 (*) >90 mL/min   Comment:            The eGFR has been calculated     using the CKD EPI equation.     This calculation has not been     validated in all clinical     situations.     eGFR's persistently     <90 mL/min signify     possible Chronic Kidney Disease.  TROPONIN I     Status: None   Collection Time    08/26/12  2:30 PM      Result Value Range    Troponin I <0.30  <0.30 ng/mL   Comment:            Due to the release kinetics of cTnI,     a negative result within the first hours     of the onset of symptoms does not rule out     myocardial infarction with certainty.     If myocardial infarction is still suspected,     repeat the test at appropriate intervals.    IMAGING: Ct Angio Chest Pe W/cm &/or Wo Cm  08/26/2012   *RADIOLOGY REPORT*  Clinical Data: Short of breath, substernal chest pain  CT ANGIOGRAPHY CHEST  Technique:  Multidetector CT imaging of the chest using the standard protocol during bolus administration of intravenous contrast. Multiplanar reconstructed images including MIPs were obtained and reviewed to evaluate the vascular anatomy.  Contrast: OMNIPAQUE IOHEXOL 350 MG/ML SOLN  Comparison: CT thorax 06/03/2004.  Findings: There are no filling defects within the pulmonary arteries to suggest acute pulmonary embolism.  No acute findings aorta great vessels.  Right main pulmonary artery is mildly enlarged at 35 mm.  Coronary calcifications are present.  Review lung parenchyma demonstrates small bilateral pleural effusions greater on the right.  There is mild ground-glass opacities in the lungs suggesting mild edema.  There is a subpleural nodule in the right upper lobe measuring 4 mm which is likely benign.  Smaller parenchymal nodule measuring 4 mm on image 23 in the right upper lobe.  There are several additional sub 5 mm nodules in the right middle lobe and right upper lobe.  There is a remote scan from 06/03/2004, CT chest which also demonstrates small nodules in the right upper lobe the right middle lobe.  Limited view of the upper abdomen is unremarkable.  Limited view of the skeleton is unremarkable.  There is degenerative spurring of the thoracic spine.  IMPRESSION:  1.  No evidence of acute pulmonary embolism. 2.  Bilateral small to moderate effusions, greater on the right. Mild pulmonary edema.  3.  Essentially stable  small pulmonary nodule in the right likely relate to granulomatous disease. 4.  Coronary artery calcifications present. 5.  Dilatation of the right main pulmonary artery may represent pulmonary hypertension.   Original Report Authenticated By: Genevive Bi, M.D.   Dg Chest Port 1 View  08/26/2012   *RADIOLOGY REPORT*  Clinical Data: Shortness of breath and chest pain.  PORTABLE CHEST - 1 VIEW  Comparison: PA and lateral chest 06/14/2012 and 08/22/2009.  Findings: Lung volumes are low but the lungs are clear.  Heart size is upper normal.  No pneumothorax or pleural fluid.  IMPRESSION: No acute disease.   Original Report Authenticated By: [name] Dexter, M.D.    IMPRESSION: 1. Atrial fibrillation rapid ventricular response, probably persistent (onset clinically 6 days ago) and recurrent (previous episode in 2012)  2.  acute congestive heart failure, likely diastolic, likely related to the tachyarrhythmia  3. high probability of obstructive sleep apnea with secondary pulmonary hypertension  4. previous history of pulmonary embolism in the setting of orthopedic surgery and sepsis, also possibly contributing to pulmonary hypertension  RECOMMENDATION: 1. Intravenous heparin 2. Transition to a novel anticoagulant such as Xarelto, we can later use warfarin if a novel agents are prohibitively expensive 3. Intravenous diltiazem for rate control, transition to oral administration 4. If atrial fibrillation does not spontaneously terminate by Monday morning, scheduled for transesophageal echocardiogram prior to electrical cardioversion; request anesthesiology support secondary to high likelihood of airway problems 5. Outpatient sleep study 6. Antiarrhythmics do not appear to be indicated at this point, symptoms appear to be readily treated with rate control only 7. Suspect heart failure will resolve without diuretic therapy once rate is controlled  Time Spent Directly with Patient: 45  minutes  Thurmon Fair, MD, [facility] [phone] office [phone] pager  08/26/2012, 5:23 PM

## 2012-08-26 NOTE — H&P (Signed)
[facility] History and Physical  [name] UJW:[id number] DOB: 20-Dec-1950 DOA: 08/26/2012  Referring physician: dr. Estell Harpin PCP: Aura Dials, MD   Chief Complaint: CP, SOB, new onset atrial fibrillation and lightheadedness   HPI: [name] is a 62 y.o. male with PMH significant for DM, GERD, morbidity obesity, hypothyroidism and total knee replacement (last surgical review mid May of 2014); came to the hospital from primary care urgent care clinic due to increased SOB, chest discomfort and lightheadedness. While over there he had EKG done which demonstrated atrial fibrillation HR 140-160; patient ask to come to ED. In the ED EKG corroborates findings of atrial fibrillation; patient was feeling also lightheaded; rest of VS stable. He was started on cardizem drip and HR controlled in the 100-110, but still irregular. Patient denies chills, fever, nausea, vomiting, dysuria, hematemesis, hematochezia, melena, HA's, neurologic deficit or any other complaints. [facility] called to admit patient for further evaluation and treatment.  Of note, Patient reports that since he most recent knee review surgery he has been more sedentary. Had hx in the past of DVT and PE.   Review of Systems: Negative except as otherwise mentioned on HPI.  Past Medical History  Diagnosis Date  . Complication of anesthesia     PT STATES HARD TO WAKE UP AFTER ONE SUGERY -STATES THE SURGERY TOOK LONGER THAN EXPECTED.  NO PROBLEMS WITH ANY OTHER SURGERY  . Hypothyroidism   . Shortness of breath     WITH EXERTION AND PAIN  . Septic arthritis of knee     LEFT KNEE  . Pain     BACK PAIN - PT ATTRIBUTES TO THE WAY HE WALKS DUE TO LEFT KNEE PROBLEM   Past Surgical History  Procedure Laterality Date  . Right knee arthroscopy  1998  . Left knee arthroscopy  1999  . Left knee surgery upper tibial ostomy    . Replacement left knee  2002  . Replacement right knee  2003  . Revision left knee cap   2004  . Left total  knee removal for infection  2006  . Reimplantation left total knee   2006  . Removal left total knee   2008  . Reimplantation left total knee  2008  . 2010 removal of left total knee    . Hydrocelectoy   2012  . Reimplantation of total knee Left 06/23/2012    Procedure: REIMPLANTATION OF LEFT TOTAL KNEE;  Surgeon: Shelda Pal, MD;  Location: [facility];  Service: Orthopedics;  Laterality: Left;   Social History:  reports that he has never smoked. He has never used smokeless tobacco. He reports that  drinks alcohol. He reports that he does not use illicit drugs.   Allergies  Allergen Reactions  . Morphine     HIVES    Family hx: significant for HTN and diabetes.  Prior to Admission medications   Medication Sig Start Date End Date Taking? Authorizing Provider  aspirin EC 81 MG tablet Take 162 mg by mouth daily.   Yes Historical Provider, MD  ibuprofen (ADVIL,MOTRIN) 200 MG tablet Take 400 mg by mouth every 6 (six) hours as needed for pain.   Yes Historical Provider, MD  levothyroxine (SYNTHROID, LEVOTHROID) 100 MCG tablet Take 100 mcg by mouth daily before breakfast.   Yes Historical Provider, MD  Multiple Vitamin (MULTIVITAMIN WITH MINERALS) TABS Take 1 tablet by mouth daily.   Yes Historical Provider, MD  Pyridoxine HCl (VITAMIN B-6 PO) Take 1 tablet by mouth  daily.   Yes Historical Provider, MD  Saw Palmetto, Serenoa repens, 320 MG CAPS Take 320 mg by mouth 2 (two) times daily.   Yes Historical Provider, MD   Physical Exam: Filed Vitals:   08/26/12 1423 08/26/12 1517 08/26/12 1530 08/26/12 1600  BP: 147/98 113/81 128/94 114/83  Pulse: 140 117 102 105  Temp: 98.8 F (37.1 C)     TempSrc: Oral     Resp: 21  23 18   SpO2: 96% 93% 95% 97%     General:  Obese, NAD white man; currently breathing better; denies palpitation. Some mid chest discomfort  Eyes: PERRL, no icterus, no nystagmus; EOMI  ENT: no erythema, no exudates; no thrush; fair dentiotion. No nostrils or ears  discharge  Neck: supple, no brutis  Cardiovascular: irregular, no rubs, no gallops, no murmurs  Respiratory: CTA bilaterally  Abdomen: obese, soft, NT, ND, positive BS  Skin: no acute rash, erythema or petechiae  Musculoskeletal: scars on both knee from orthopedic surgeries; limited move, trace swelling bilaterally, no pain  Psychiatric: appropriate mood  Neurologic: no focal motor or sensory deficit appreciated. CN intact; AAOX3; normal finger to nose  Labs on Admission:  Basic Metabolic Panel:  Recent Labs Lab 08/26/12 1430  NA 140  K 3.8  CL 107  CO2 20  GLUCOSE 106*  BUN 21  CREATININE 1.07  CALCIUM 9.8   CBC:  Recent Labs Lab 08/26/12 1430  WBC 5.9  HGB 11.0*  HCT 35.6*  MCV 92.0  PLT 254   Cardiac Enzymes:  Recent Labs Lab 08/26/12 1430  TROPONINI <0.30    Radiological Exams on Admission: Dg Chest Port 1 View  08/26/2012   *RADIOLOGY REPORT*  Clinical Data: Shortness of breath and chest pain.  PORTABLE CHEST - 1 VIEW  Comparison: PA and lateral chest 06/14/2012 and 08/22/2009.  Findings: Lung volumes are low but the lungs are clear.  Heart size is upper normal.  No pneumothorax or pleural fluid.  IMPRESSION: No acute disease.   Original Report Authenticated By: [name] Dexter, M.D.    EKG:  Rate:140  Rhythm: atrial fibrillation  QRS Axis: normal  Intervals: normal  ST/T Wave abnormalities: normal  Conduction Disutrbances:none  Narrative Interpretation:  Old EKG Reviewed: none available  Assessment/Plan 1-SOB, chest discomfort and new onset of atrial fibrillation: differential includes PE, ACS and abnormal nodal dysfunction to cause a. Fib  -will check CT angio (patient with hx of DVT/PE in the past, also with recent orthopedic surgery and increased sedentary behavior) -continue cardizem drip & heparin drip -admit to step down -check TSH, free T4, cycle cardiac enzymes and perform 2-D echo -no signs of infection at this point (no fever,  normal WBC and CXR w/o infiltrates) -will check UA and UDS -will check lipid panel as part of stratification -will check LE duplex  2-DM: currently following just diet as an outpatient. Will check A1C , low carb diet and SSI  3-Morbid obesity: counseling about low calorie diet, weight loss and increase exercise after this acute event provided.  4-Chest discomfort: as mentioned above. Will also start PPI as patient high risk for GERD.  5-hypothyroidism: will check thyroid function test and continue synthroid for now.  ZOX:WRUEAVW drip.   Cardiology (Dr. Royann Shivers)  Code Status: Full code Family Communication: significant other at bedside Disposition Plan: admit to stepdown, LOS > 2 midnights, inpatient  Time spent: 60 minutes  Britley Gashi [facility] Pager [phone]  If 7PM-7AM, please contact night-coverage [email or web address] Password [facility] 08/26/2012, 4:36  PM       

## 2012-08-27 DIAGNOSIS — I369 Nonrheumatic tricuspid valve disorder, unspecified: Secondary | ICD-10-CM

## 2012-08-27 DIAGNOSIS — R0602 Shortness of breath: Secondary | ICD-10-CM

## 2012-08-27 DIAGNOSIS — I509 Heart failure, unspecified: Secondary | ICD-10-CM

## 2012-08-27 DIAGNOSIS — I5031 Acute diastolic (congestive) heart failure: Secondary | ICD-10-CM

## 2012-08-27 LAB — GLUCOSE, CAPILLARY
Glucose-Capillary: 90 mg/dL (ref 70–99)
Glucose-Capillary: 105 mg/dL — ABNORMAL HIGH (ref 70–99)
Glucose-Capillary: 88 mg/dL (ref 70–99)

## 2012-08-27 LAB — LIPID PANEL
Cholesterol: 109 mg/dL (ref 0–200)
Total CHOL/HDL Ratio: 2.4 RATIO
Triglycerides: 65 mg/dL (ref <150)

## 2012-08-27 LAB — CBC
HCT: 33.4 % — ABNORMAL LOW (ref 39.0–52.0)
Hemoglobin: 10.2 g/dL — ABNORMAL LOW (ref 13.0–17.0)
MCHC: 30.5 g/dL (ref 30.0–36.0)
WBC: 5.2 10*3/uL (ref 4.0–10.5)

## 2012-08-27 LAB — BASIC METABOLIC PANEL
BUN: 20 mg/dL (ref 6–23)
Chloride: 108 mEq/L (ref 96–112)
GFR calc Af Amer: >90 mL/min (ref >90)
GFR calc non Af Amer: >90 mL/min (ref >90)
Glucose, Bld: 106 mg/dL — ABNORMAL HIGH (ref 70–99)
Potassium: 3.9 mEq/L (ref 3.5–5.1)
Sodium: 140 mEq/L (ref 135–145)

## 2012-08-27 LAB — TROPONIN I: Troponin I: <0.3 ng/mL (ref <0.30)

## 2012-08-27 LAB — HEMOGLOBIN A1C: Mean Plasma Glucose: 105 mg/dL (ref <117)

## 2012-08-27 LAB — HEPARIN LEVEL (UNFRACTIONATED)
Heparin Unfractionated: <0.1 IU/mL — ABNORMAL LOW (ref 0.30–0.70)
Heparin Unfractionated: 0.59 IU/mL (ref 0.30–0.70)

## 2012-08-27 MED ORDER — RIVAROXABAN 20 MG PO TABS: 20.0000 mg | ORAL_TABLET | Freq: Every day | ORAL | Status: DC

## 2012-08-27 MED ORDER — HEPARIN (PORCINE) IN NACL 100-0.45 UNIT/ML-% IJ SOLN
2000.0000 [IU]/h | INTRAMUSCULAR | Status: AC
  Administered 2012-08-27 (×2): 2000 [IU]/h via INTRAVENOUS
  Filled 2012-08-27 (×2): qty 250

## 2012-08-27 MED ORDER — RIVAROXABAN 15 MG PO TABS
15.0000 mg | ORAL_TABLET | Freq: Two times a day (BID) | ORAL | Status: DC
  Filled 2012-08-27 (×2): qty 1

## 2012-08-27 MED ORDER — DILTIAZEM HCL ER COATED BEADS 360 MG PO CP24
360.0000 mg | ORAL_CAPSULE | Freq: Every day | ORAL | Status: DC
  Administered 2012-08-27 – 2012-08-29 (×3): 360 mg via ORAL
  Filled 2012-08-27 (×4): qty 1

## 2012-08-27 MED ORDER — RIVAROXABAN 15 MG PO TABS
15.0000 mg | ORAL_TABLET | Freq: Two times a day (BID) | ORAL | Status: DC
  Administered 2012-08-27 – 2012-09-02 (×12): 15 mg via ORAL
  Filled 2012-08-27 (×14): qty 1

## 2012-08-27 MED ORDER — HEPARIN BOLUS VIA INFUSION
3000.0000 [IU] | Freq: Once | INTRAVENOUS | Status: AC
  Administered 2012-08-27: 3000 [IU] via INTRAVENOUS
  Filled 2012-08-27: qty 3000

## 2012-08-27 NOTE — Progress Notes (Addendum)
ANTICOAGULATION CONSULT NOTE - Follow Up  Pharmacy Consult for heparin Indication: atrial fibrillation  Allergies  Allergen Reactions  . Morphine     HIVES    Patient Measurements: Weight: 397 lb 3.2 oz (180.169 kg) 68 in 175kg IBW 68.4 Heparin Dosing Weight: 112kg  Vital Signs: Temp: 98.1 F (36.7 C) (07/20 0800) Temp src: Oral (07/20 0800) BP: 125/84 mmHg (07/20 0750) Pulse Rate: 84 (07/20 0750)  Labs:  Recent Labs  08/26/12 1430 08/26/12 1721 08/26/12 2340 08/27/12 0534 08/27/12 0816  HGB 11.0*  --   --  10.2*  --   HCT 35.6*  --   --  33.4*  --   PLT 254  --   --  218  --   APTT  --  30  --   --   --   LABPROT  --  14.4  --   --   --   INR  --  1.14  --   --   --   HEPARINUNFRC  --   --  <0.10*  --  0.55  CREATININE 1.07  --   --  0.89  --   TROPONINI <0.30  --  <0.30 <0.30  --     Medications:  Scheduled:  . insulin aspart  0-9 Units Subcutaneous TID WC  . levothyroxine  100 mcg Oral QAC breakfast  . multivitamin with minerals  1 tablet Oral Daily  . pantoprazole  40 mg Oral Q1200   Infusions:  . sodium chloride 50 mL/hr at 08/26/12 1700  . diltiazem (CARDIZEM) infusion 15 mg/hr (08/27/12 0800)  . heparin 2,000 Units/hr (08/27/12 0800)    Assessment: 62 YO M w/ new onset afib. Pharmacy asked to dose Heparin. Hx: DM, morbid obesity, Hypothyroid, knee surger 06/23/12, Hx DVT and PE. Plan is for cardioversion on Monday if patient doesn't convert spontaneously.  Heparin level is therapeutic this am (0.55)  Confirmed with RN - infusing at 5ml/hr, no problems with infusion  No bleeding events reported in chart notes, plts wnl  Goal of Therapy:  Heparin level 0.3-0.7 units/ml Monitor platelets by anticoagulation protocol: Yes   Plan:   Continue heparin at 2000 units/hr (52ml/hr)  Recheck HL in 6 hours to confirm therapeutic dose  Daily Heparin level, Daily CBC  Darrol Angel, PharmD Pager: [phone] 08/27/2012 9:58 AM     ADDENDUM: Repeat heparin level remains therapeutic (0.59). No bleeding noted in chart notes. Plan to change to Xarelto eventually noted. Continue heparin at 2000 units/hr for now. F/U in HL in am.  Darrol Angel, PharmD Pager: [phone] 08/27/2012 3:05 PM

## 2012-08-27 NOTE — Progress Notes (Signed)
Subjective: Slept pretty well.  Breathing is a bit irreg.  Objective: Vital signs in last 24 hours: Temp:  [97.6 F (36.4 C)-98.8 F (37.1 C)] 98.1 F (36.7 C) (07/20 0800) Pulse Rate:  [84-140] 84 (07/20 0750) Resp:  [17-28] 17 (07/20 0750) BP: (113-159)/(61-107) 125/84 mmHg (07/20 0750) SpO2:  [92 %-97 %] 92 % (07/20 0750) Weight:  [397 lb 3.2 oz (180.169 kg)] 397 lb 3.2 oz (180.169 kg) (07/20 0500)    Intake/Output from previous day: 07/19 0701 - 07/20 0700 In: 1615 [P.O.:600; I.V.:1015] Out: 1070 [Urine:1070] Intake/Output this shift: Total I/O In: 85 [I.V.:85] Out: 400 [Urine:400]  Medications Current Facility-Administered Medications  Medication Dose Route Frequency Provider Last Rate Last Dose  . 0.9 %  sodium chloride infusion   Intravenous Continuous Vassie Loll, MD 50 mL/hr at 08/26/12 1700    . acetaminophen (TYLENOL) tablet 650 mg  650 mg Oral Q6H PRN Vassie Loll, MD   650 mg at 08/27/12 0556   Or  . acetaminophen (TYLENOL) suppository 650 mg  650 mg Rectal Q6H PRN Vassie Loll, MD      . diltiazem (CARDIZEM) 100 mg in dextrose 5 % 100 mL infusion  5-15 mg/hr Intravenous Continuous Benny Lennert, MD 15 mL/hr at 08/27/12 0800 15 mg/hr at 08/27/12 0800  . heparin ADULT infusion 100 units/mL (25000 units/250 mL)  2,000 Units/hr Intravenous Continuous Leann Trefz Poindexter, RPH 20 mL/hr at 08/27/12 0800 2,000 Units/hr at 08/27/12 0800  . HYDROmorphone (DILAUDID) injection 1 mg  1 mg Intravenous Q4H PRN Vassie Loll, MD   1 mg at 08/27/12 0015  . insulin aspart (novoLOG) injection 0-9 Units  0-9 Units Subcutaneous TID WC Vassie Loll, MD      . levothyroxine (SYNTHROID, LEVOTHROID) tablet 100 mcg  100 mcg Oral QAC breakfast Vassie Loll, MD      . multivitamin with minerals tablet 1 tablet  1 tablet Oral Daily Vassie Loll, MD      . ondansetron [facility]) tablet 4 mg  4 mg Oral Q6H PRN Vassie Loll, MD       Or  . ondansetron [facility]) injection 4 mg  4  mg Intravenous Q6H PRN Vassie Loll, MD      . pantoprazole (PROTONIX) EC tablet 40 mg  40 mg Oral Q1200 Vassie Loll, MD        PE: General appearance: alert, cooperative and no distress Lungs: clear to auscultation bilaterally Heart: irregularly irregular rhythm and no audible MM. Extremities: Trace LEE Pulses: 2+ and symmetric Skin: Warm and dry Neurologic: Grossly normal  Lab Results:   Recent Labs  08/26/12 1430 08/27/12 0534  WBC 5.9 5.2  HGB 11.0* 10.2*  HCT 35.6* 33.4*  PLT 254 218   BMET  Recent Labs  08/26/12 1430 08/27/12 0534  NA 140 140  K 3.8 3.9  CL 107 108  CO2 20 23  GLUCOSE 106* 106*  BUN 21 20  CREATININE 1.07 0.89  CALCIUM 9.8 9.0   PT/INR  Recent Labs  08/26/12 1721  LABPROT 14.4  INR 1.14   Cholesterol  Recent Labs  08/27/12 0534  CHOL 109   Lipid Panel     Component Value Date/Time   CHOL 109 08/27/2012 0534   TRIG 65 08/27/2012 0534   HDL 45 08/27/2012 0534   CHOLHDL 2.4 08/27/2012 0534   VLDL 13 08/27/2012 0534   LDLCALC 51 08/27/2012 0534    Assessment/Plan  Principal Problem:   Atrial fibrillation Active Problems:  DM   Morbid obesity   SOB (shortness of breath)   Chest discomfort  Plan:   -Maintaining Afib with a rate in the 80's.  Frequent PVCs.  BP stable and controlled.  TEE/DCCV will be   scheduled.  We will not know until tomorrow when it will be.  TSH and Free T4 ok.  Will change to PO cardizem 360mg .   -Possible DVT on LE venous dopplers but not conclusive.  Lower ext edema is minimal. Either way, he is on IV heparin and will be   transitioned to Xarelto. -2D echo pending.   LOS: 1 day    HAGER, BRYAN 08/27/2012 10:34 AM  I have seen and examined the patient along with Wilburt Finlay, PA.  I have reviewed the chart, notes and new data.  I agree with PA's note.  Transition to PO rate control meds. If transthoracic is not complete by tomorrow and we can get TEE on schedule, may cancel TTE.  Thurmon Fair, MD, [facility] [phone] 08/27/2012, 11:12 AM

## 2012-08-27 NOTE — Progress Notes (Signed)
ANTICOAGULATION CONSULT NOTE - Initial Consult  Pharmacy Consult for Xarelto (change from heparin) Indication: AFIB, probable VTE  Allergies  Allergen Reactions  . Morphine     HIVES    Patient Measurements: Weight: 397 lb 3.2 oz (180.169 kg)  Vital Signs: Temp: 98.1 F (36.7 C) (07/20 0800) Temp src: Oral (07/20 0800) BP: 132/66 mmHg (07/20 1230) Pulse Rate: 62 (07/20 1230)  Labs:  Recent Labs  08/26/12 1430 08/26/12 1721 08/26/12 2340 08/27/12 0534 08/27/12 0816 08/27/12 1414  HGB 11.0*  --   --  10.2*  --   --   HCT 35.6*  --   --  33.4*  --   --   PLT 254  --   --  218  --   --   APTT  --  30  --   --   --   --   LABPROT  --  14.4  --   --   --   --   INR  --  1.14  --   --   --   --   HEPARINUNFRC  --   --  <0.10*  --  0.55 0.59  CREATININE 1.07  --   --  0.89  --   --   TROPONINI <0.30  --  <0.30 <0.30  --   --     The CrCl is unknown because both a height and weight (above a minimum accepted value) are required for this calculation.   Medical History: Past Medical History  Diagnosis Date  . Complication of anesthesia     PT STATES HARD TO WAKE UP AFTER ONE SUGERY -STATES THE SURGERY TOOK LONGER THAN EXPECTED.  NO PROBLEMS WITH ANY OTHER SURGERY  . Hypothyroidism   . Shortness of breath     WITH EXERTION AND PAIN  . Septic arthritis of knee     LEFT KNEE  . Pain     BACK PAIN - PT ATTRIBUTES TO THE WAY HE WALKS DUE TO LEFT KNEE PROBLEM    Medications:  Scheduled:  . diltiazem  360 mg Oral Daily  . insulin aspart  0-9 Units Subcutaneous TID WC  . levothyroxine  100 mcg Oral QAC breakfast  . multivitamin with minerals  1 tablet Oral Daily  . pantoprazole  40 mg Oral Q1200  . [START ON 09/18/2012] rivaroxaban  20 mg Oral Q breakfast   Followed by  . rivaroxaban  15 mg Oral BID WC   Infusions:  . sodium chloride 50 mL/hr at 08/26/12 1700  . heparin 2,000 Units/hr (08/27/12 1400)    Assessment: 62 yo morbidly obese male w/ afib and probable  VTE. Has been on heparin since 7/19, now to transition to xarelto. Scheduled DCCV tomorrow, spoke w/ Wilburt Finlay, [facility] who confirmed that ok to transition to Xarelto prior to DCCV  Scr wnl, crcl 59ml/min/1.73m2  No bleeding reported  Goal of Therapy:  Appropriate dose of Xarelto Appropriate transition off heparin  Plan:  D/C heparin Xarelto 15mg  BIDWC x 21 days then Xarelto 20mg  daily with food Note: Using VTE dose of Xarelto given suspected VTE  Gwen Her PharmD  [phone] 08/27/2012 4:17 PM

## 2012-08-27 NOTE — Progress Notes (Signed)
[facility] PROGRESS NOTE  [name] ZOX:[id number] DOB: 03-03-1950 DOA: 08/26/2012 PCP: Aura Dials, MD  Assessment/Plan: 1-SOB, chest discomfort and new onset of atrial fibrillation: differential includes PE, ACS and abnormal nodal dysfunction as cause a. Fib  -CTA negative for PE -will transition cardizem drip to PO cardizem; will also transition to xarelto and discontinue heparin drip -no signs of infection; UDS, UA,lipid profile, TSH and free T4 WNL -no further chest discomfort and CE'z negative X3 -will check LE duplex  -will transfer to telemetry bed  2-DM: currently following just diet as an outpatient. A1C 5.3; continue SSI; but not indication for outpatient hypoglycemic agents.  3-Morbid obesity: counseling about low calorie diet, weight loss and increase exercise after this acute event provided.   4-Chest discomfort: as mentioned above. Will also continue  PPI as patient high risk for GERD.   5-hypothyroidism: will continue synthroid for now. TSH and free T4 WNL  6-pulmonary HTN and mild diastolic heart failure: will need sleep study as an outpatient; continue heart healthy diet and close follow up to I's and O's  7-Presumed DVT: currently on heparin drip and with transition to xarelto.  WJX:BJYNWGN drip; plan is to transition him to xarelto  Code Status: Full Family Communication: no family at bedisde Disposition Plan: will transfer to telemetry   Consultants:  Cardiology   Procedures:  LE dopplers (undetermine DVT, due to body habitus)  2-D Echo (pending)  Antibiotics: None  HPI/Subjective: Denies CP, feeling a lot better. HR now controlled and in the process of transitioning cardizem to PO   Objective: Filed Vitals:   08/27/12 0500 08/27/12 0559 08/27/12 0750 08/27/12 0800  BP:  128/61 125/84   Pulse:  87 84   Temp:    98.1 F (36.7 C)  TempSrc:    Oral  Resp:  20 17   Weight: 180.169 kg (397 lb 3.2 oz)     SpO2:  94% 92%      Intake/Output Summary (Last 24 hours) at 08/27/12 1314 Last data filed at 08/27/12 0800  Gross per 24 hour  Intake   1700 ml  Output   1470 ml  Net    230 ml   Filed Weights   08/27/12 0500  Weight: 180.169 kg (397 lb 3.2 oz)    Exam:   General:  NAD, afebrile; denies CP  Cardiovascular: rate controlled, no rubs, no gallops, irregular rhythm   Respiratory: CTA bilaterally  Abdomen: trace edema bilaterally  Musculoskeletal: no joint swelling or erythema  Data Reviewed: Basic Metabolic Panel:  Recent Labs Lab 08/26/12 1430 08/27/12 0534  NA 140 140  K 3.8 3.9  CL 107 108  CO2 20 23  GLUCOSE 106* 106*  BUN 21 20  CREATININE 1.07 0.89  CALCIUM 9.8 9.0  MG 2.0  --   PHOS 3.9  --    CBC:  Recent Labs Lab 08/26/12 1430 08/27/12 0534  WBC 5.9 5.2  HGB 11.0* 10.2*  HCT 35.6* 33.4*  MCV 92.0 93.3  PLT 254 218   Cardiac Enzymes:  Recent Labs Lab 08/26/12 1430 08/26/12 2340 08/27/12 0534  TROPONINI <0.30 <0.30 <0.30   CBG:  Recent Labs Lab 08/26/12 2251 08/27/12 0739 08/27/12 1236  GLUCAP 112* 105* 90    Recent Results (from the past 240 hour(s))  MRSA PCR SCREENING     Status: None   Collection Time    08/26/12  5:32 PM      Result Value Range Status   MRSA  by PCR NEGATIVE  NEGATIVE Final   Comment:            The GeneXpert MRSA Assay (FDA     approved for NASAL specimens     only), is one component of a     comprehensive MRSA colonization     surveillance program. It is not     intended to diagnose MRSA     infection nor to guide or     monitor treatment for     MRSA infections.     Studies: Ct Angio Chest Pe W/cm &/or Wo Cm  08/26/2012   *RADIOLOGY REPORT*  Clinical Data: Short of breath, substernal chest pain  CT ANGIOGRAPHY CHEST  Technique:  Multidetector CT imaging of the chest using the standard protocol during bolus administration of intravenous contrast. Multiplanar reconstructed images including MIPs were obtained and  reviewed to evaluate the vascular anatomy.  Contrast: OMNIPAQUE IOHEXOL 350 MG/ML SOLN  Comparison: CT thorax 06/03/2004.  Findings: There are no filling defects within the pulmonary arteries to suggest acute pulmonary embolism.  No acute findings aorta great vessels.  Right main pulmonary artery is mildly enlarged at 35 mm.  Coronary calcifications are present.  Review lung parenchyma demonstrates small bilateral pleural effusions greater on the right.  There is mild ground-glass opacities in the lungs suggesting mild edema.  There is a subpleural nodule in the right upper lobe measuring 4 mm which is likely benign.  Smaller parenchymal nodule measuring 4 mm on image 23 in the right upper lobe.  There are several additional sub 5 mm nodules in the right middle lobe and right upper lobe.  There is a remote scan from 06/03/2004, CT chest which also demonstrates small nodules in the right upper lobe the right middle lobe.  Limited view of the upper abdomen is unremarkable.  Limited view of the skeleton is unremarkable.  There is degenerative spurring of the thoracic spine.  IMPRESSION:  1.  No evidence of acute pulmonary embolism. 2.  Bilateral small to moderate effusions, greater on the right. Mild pulmonary edema.  3.  Essentially stable small pulmonary nodule in the right likely relate to granulomatous disease. 4.  Coronary artery calcifications present. 5.  Dilatation of the right main pulmonary artery may represent pulmonary hypertension.   Original Report Authenticated By: Genevive Bi, M.D.   Dg Chest Port 1 View  08/26/2012   *RADIOLOGY REPORT*  Clinical Data: Shortness of breath and chest pain.  PORTABLE CHEST - 1 VIEW  Comparison: PA and lateral chest 06/14/2012 and 08/22/2009.  Findings: Lung volumes are low but the lungs are clear.  Heart size is upper normal.  No pneumothorax or pleural fluid.  IMPRESSION: No acute disease.   Original Report Authenticated By: [name] Dexter, M.D.     Scheduled Meds: . diltiazem  360 mg Oral Daily  . insulin aspart  0-9 Units Subcutaneous TID WC  . levothyroxine  100 mcg Oral QAC breakfast  . multivitamin with minerals  1 tablet Oral Daily  . pantoprazole  40 mg Oral Q1200   Continuous Infusions: . sodium chloride 50 mL/hr at 08/26/12 1700  . heparin 2,000 Units/hr (08/27/12 0800)    Principal Problem:   Atrial fibrillation Active Problems:   DM   Morbid obesity   SOB (shortness of breath)   Chest discomfort    Time spent: >30 minutes    Camauri Craton  [facility] Pager [phone]. If 7PM-7AM, please contact night-coverage at [email or web address], password [facility] 08/27/2012, 1:14  PM  LOS: 1 day

## 2012-08-27 NOTE — Progress Notes (Signed)
  Echocardiogram 2D Echocardiogram has been performed.  Roberts, Birdena Kingma M 08/27/2012, 11:53 AM 

## 2012-08-27 NOTE — Progress Notes (Signed)
ANTICOAGULATION CONSULT NOTE - Follow Up Consult  Pharmacy Consult for heparin Indication: atrial fibrillation  Allergies  Allergen Reactions  . Morphine     HIVES    Patient Measurements:  68 in 175kg IBW 68.4 Heparin Dosing Weight: 112kg  Vital Signs: Temp: 97.6 F (36.4 C) (07/20 0000) Temp src: Oral (07/20 0000) BP: 125/78 mmHg (07/19 2315) Pulse Rate: 94 (07/19 2315)  Labs:  Recent Labs  08/26/12 1430 08/26/12 1721 08/26/12 2340  HGB 11.0*  --   --   HCT 35.6*  --   --   PLT 254  --   --   APTT  --  30  --   LABPROT  --  14.4  --   INR  --  1.14  --   HEPARINUNFRC  --   --  <0.10*  CREATININE 1.07  --   --   TROPONINI <0.30  --  <0.30    The CrCl is unknown because both a height and weight (above a minimum accepted value) are required for this calculation.   Medical History: Past Medical History  Diagnosis Date  . Complication of anesthesia     PT STATES HARD TO WAKE UP AFTER ONE SUGERY -STATES THE SURGERY TOOK LONGER THAN EXPECTED.  NO PROBLEMS WITH ANY OTHER SURGERY  . Hypothyroidism   . Shortness of breath     WITH EXERTION AND PAIN  . Septic arthritis of knee     LEFT KNEE  . Pain     BACK PAIN - PT ATTRIBUTES TO THE WAY HE WALKS DUE TO LEFT KNEE PROBLEM    Medications:  Scheduled:  . insulin aspart  0-9 Units Subcutaneous TID WC  . levothyroxine  100 mcg Oral QAC breakfast  . multivitamin with minerals  1 tablet Oral Daily  . pantoprazole  40 mg Oral Q1200   Infusions:  . sodium chloride 50 mL/hr at 08/26/12 1700  . diltiazem (CARDIZEM) infusion 15 mg/hr (08/26/12 2031)  . heparin 1,600 Units/hr (08/26/12 2028)    Assessment: 62 YOM w/ new onset afib. Pharmacy asked to dose Heparin. Hx: DM, MO, Hypothyroid, knee surger 06/23/12, Hx DVT and PE.  Initial Heparin Level < 0.1 with heparin infusing @ 1600 units/hr.  Confirmed with RN no interruption of therapy or problem with infusion site  Goal of Therapy:  Heparin level 0.3-0.7  units/ml Monitor platelets by anticoagulation protocol: Yes   Plan:   Rebolus Heparin 3000 units then increase infusion to 2000 units/hour  6 hour heparin level  Daily Heparin level  Daily CBC  Terrilee Files, PharmD 08/27/2012 1:24 AM

## 2012-08-27 NOTE — Progress Notes (Signed)
*  Preliminary Results* Bilateral lower extremity venous duplex completed. Study was technically difficult due to patient body habitus. There is no obvious evidence of deep vein thrombosis of the visualized veins of bilateral lower extremities. The left saphenofemoral junction and common femoral vein were difficult to compress. This may be due to patient body habitus, proximal iliac/ IVC thrombosis, or deep vein thrombosis. Due to technical limitations, etiology is unable to be determined.  08/27/2012  Gertie Fey, RVT, RDCS, RDMS

## 2012-08-28 ENCOUNTER — Inpatient Hospital Stay (HOSPITAL_COMMUNITY): Payer: Medicare Other | Admitting: Certified Registered Nurse Anesthetist

## 2012-08-28 ENCOUNTER — Encounter (HOSPITAL_COMMUNITY)
Admission: EM | Disposition: A | Discharge status: Home / Self Care | Payer: Self-pay | Source: Home / Self Care | Attending: Cardiovascular Disease

## 2012-08-28 ENCOUNTER — Encounter (HOSPITAL_COMMUNITY): Payer: Self-pay | Admitting: Certified Registered Nurse Anesthetist

## 2012-08-28 DIAGNOSIS — I369 Nonrheumatic tricuspid valve disorder, unspecified: Secondary | ICD-10-CM

## 2012-08-28 DIAGNOSIS — E039 Hypothyroidism, unspecified: Secondary | ICD-10-CM

## 2012-08-28 HISTORY — PX: CARDIOVERSION: SHX1299

## 2012-08-28 HISTORY — PX: TEE WITHOUT CARDIOVERSION: SHX5443

## 2012-08-28 LAB — BASIC METABOLIC PANEL
BUN: 17 mg/dL (ref 6–23)
GFR calc Af Amer: >90 mL/min (ref >90)
GFR calc non Af Amer: 87 mL/min — ABNORMAL LOW (ref >90)
Potassium: 3.9 mEq/L (ref 3.5–5.1)
Sodium: 140 mEq/L (ref 135–145)

## 2012-08-28 LAB — GLUCOSE, CAPILLARY: Glucose-Capillary: 94 mg/dL (ref 70–99)

## 2012-08-28 LAB — CBC
MCHC: 30.4 g/dL (ref 30.0–36.0)
Platelets: 196 10*3/uL (ref 150–400)
RDW: 16 % — ABNORMAL HIGH (ref 11.5–15.5)

## 2012-08-28 SURGERY — ECHOCARDIOGRAM, TRANSESOPHAGEAL
Anesthesia: Monitor Anesthesia Care | Site: Mouth | Wound class: Clean

## 2012-08-28 MED ORDER — OXYCODONE HCL 5 MG PO TABS: 5.0000 mg | ORAL_TABLET | Freq: Once | ORAL | Status: DC | PRN

## 2012-08-28 MED ORDER — LACTATED RINGERS IV SOLN
INTRAVENOUS | Status: DC | PRN
  Administered 2012-08-28: 14:00:00 via INTRAVENOUS

## 2012-08-28 MED ORDER — HYDROMORPHONE HCL PF 1 MG/ML IJ SOLN: 0.2500 mg | INTRAMUSCULAR | Status: DC | PRN

## 2012-08-28 MED ORDER — MIDAZOLAM HCL 5 MG/5ML IJ SOLN
INTRAMUSCULAR | Status: DC | PRN
Start: 2012-08-28 — End: 2012-08-28
  Administered 2012-08-28: 2 mg via INTRAVENOUS

## 2012-08-28 MED ORDER — PROPOFOL 10 MG/ML IV BOLUS
INTRAVENOUS | Status: DC | PRN
  Administered 2012-08-28: 30 mg via INTRAVENOUS
  Administered 2012-08-28: 100 mg via INTRAVENOUS

## 2012-08-28 MED ORDER — SODIUM CHLORIDE 0.9 % IV SOLN: INTRAVENOUS | Status: DC

## 2012-08-28 MED ORDER — SOTALOL HCL 80 MG PO TABS
160.0000 mg | ORAL_TABLET | Freq: Two times a day (BID) | ORAL | Status: DC
  Administered 2012-08-28: 160 mg via ORAL
  Filled 2012-08-28 (×3): qty 2

## 2012-08-28 MED ORDER — BUTAMBEN-TETRACAINE-BENZOCAINE 2-2-14 % EX AERO
INHALATION_SPRAY | CUTANEOUS | Status: DC | PRN
  Administered 2012-08-28: 1 via TOPICAL

## 2012-08-28 MED ORDER — PROPOFOL INFUSION 10 MG/ML OPTIME
INTRAVENOUS | Status: DC | PRN
  Administered 2012-08-28: 120 ug/kg/min via INTRAVENOUS

## 2012-08-28 MED ORDER — OXYCODONE HCL 5 MG/5ML PO SOLN: 5.0000 mg | Freq: Once | ORAL | Status: DC | PRN

## 2012-08-28 MED ORDER — PROMETHAZINE HCL 25 MG/ML IJ SOLN: 6.2500 mg | INTRAMUSCULAR | Status: DC | PRN

## 2012-08-28 MED ORDER — LACTATED RINGERS IV SOLN
INTRAVENOUS | Status: DC
  Administered 2012-08-28: 11:00:00 via INTRAVENOUS

## 2012-08-28 SURGICAL SUPPLY — 2 items
BLADE CLIPPER SURG NEURO (BLADE) ×3 IMPLANT
PAD DEFIB PEDI PADZ (MISCELLANEOUS) ×3 IMPLANT

## 2012-08-28 NOTE — Anesthesia Postprocedure Evaluation (Signed)
Anesthesia Post Note  Patient: [name]  Procedure(s) Performed: Procedure(s) (LRB): TRANSESOPHAGEAL ECHOCARDIOGRAM (TEE) (N/A) CARDIOVERSION (N/A)  Anesthesia type: general  Patient location: PACU  Post pain: Pain level controlled  Post assessment: Patient's Cardiovascular Status Stable  Last Vitals:  Filed Vitals:   08/28/12 1500  BP: 109/75  Pulse: 92  Temp:   Resp: 25    Post vital signs: Reviewed and stable  Level of consciousness: sedated  Complications: No apparent anesthesia complications

## 2012-08-28 NOTE — Anesthesia Preprocedure Evaluation (Addendum)
Anesthesia Evaluation  Patient identified by MRN, date of birth, ID band Patient awake    Reviewed: Allergy & Precautions, H&P , NPO status , Patient's Chart, lab work & pertinent test results, reviewed documented beta blocker date and time   History of Anesthesia Complications Negative for: history of anesthetic complications  Airway Mallampati: II      Dental  (+) Missing, Chipped, Poor Dentition and Dental Advisory Given,    Pulmonary shortness of breath,          Cardiovascular negative cardio ROS  + dysrhythmias Atrial Fibrillation     Neuro/Psych    GI/Hepatic negative GI ROS,   Endo/Other  diabetes, Insulin DependentHypothyroidism   Renal/GU Renal disease     Musculoskeletal   Abdominal   Peds  Hematology   Anesthesia Other Findings   Reproductive/Obstetrics                         Anesthesia Physical Anesthesia Plan  ASA: III  Anesthesia Plan: MAC   Post-op Pain Management:    Induction: Intravenous  Airway Management Planned:   Additional Equipment:   Intra-op Plan:   Post-operative Plan:   Informed Consent:   Plan Discussed with: CRNA, Anesthesiologist and Surgeon  Anesthesia Plan Comments:         Anesthesia Quick Evaluation

## 2012-08-28 NOTE — Progress Notes (Signed)
DR. Keene Breath speaking with patient ,

## 2012-08-28 NOTE — Progress Notes (Signed)
[facility]  DAILY PROGRESS NOTE   Subjective:  Remains in AF, well rate controlled at rest, rather fast with walking. We attempted cardioversion x3 (after TEE with no LA clot), unsuccessfully despite maximum device output. Note mildly depressed LVEF (40-45%) with global hypokinesis.  Objective:  Temp:  [97.7 F (36.5 C)-98.7 F (37.1 C)] 97.7 F (36.5 C) (07/21 0520) Pulse Rate:  [51-98] 51 (07/21 0520) Resp:  [17-20] 18 (07/21 0520) BP: (112-124)/(67-82) 124/82 mmHg (07/21 0520) SpO2:  [94 %-98 %] 98 % (07/21 0520) Weight:  [180.486 kg (397 lb 14.4 oz)] 180.486 kg (397 lb 14.4 oz) (07/21 0500) Weight change: 0.318 kg (11.2 oz)  Intake/Output from previous day: 07/20 0701 - 07/21 0700 In: 1730.7 [P.O.:240; I.V.:1490.7] Out: 750 [Urine:750]  Intake/Output from this shift:    Medications: Current Facility-Administered Medications  Medication Dose Route Frequency Provider Last Rate Last Dose  . 0.9 %  sodium chloride infusion   Intravenous Continuous Vassie Loll, MD 50 mL/hr at 08/26/12 1700    . 0.9 %  sodium chloride infusion   Intravenous Continuous Wilburt Finlay, PA-C      . [facility] HOLD] acetaminophen (TYLENOL) tablet 650 mg  650 mg Oral Q6H PRN Vassie Loll, MD   650 mg at 08/27/12 1903   Or  . [facility] HOLD] acetaminophen (TYLENOL) suppository 650 mg  650 mg Rectal Q6H PRN Vassie Loll, MD      . butamben-tetracaine-benzocaine (CETACAINE) spray    PRN Thurmon Fair, MD   1 spray at 08/28/12 1400  . [[facility] HOLD] diltiazem (CARDIZEM CD) 24 hr capsule 360 mg  360 mg Oral Daily Wilburt Finlay, PA-C   360 mg at 08/28/12 0957  . [facility] HOLD] HYDROmorphone (DILAUDID) injection 1 mg  1 mg Intravenous Q4H PRN Vassie Loll, MD   1 mg at 08/27/12 2223  . [[facility] HOLD] insulin aspart (novoLOG) injection 0-9 Units  0-9 Units Subcutaneous TID WC Vassie Loll, MD      . lactated ringers infusion   Intravenous Continuous Thurmon Fair, MD 50 mL/hr at 08/28/12 1126     . [[facility] HOLD] levothyroxine (SYNTHROID, LEVOTHROID) tablet 100 mcg  100 mcg Oral QAC breakfast Vassie Loll, MD   100 mcg at 08/28/12 0957  . [[facility] HOLD] multivitamin with minerals tablet 1 tablet  1 tablet Oral Daily Vassie Loll, MD   1 tablet at 08/28/12 0957  . [[facility] HOLD] ondansetron (ZOFRAN) tablet 4 mg  4 mg Oral Q6H PRN Vassie Loll, MD       Or  . [facility] HOLD] ondansetron [facility]) injection 4 mg  4 mg Intravenous Q6H PRN Vassie Loll, MD      . [facility] HOLD] pantoprazole (PROTONIX) EC tablet 40 mg  40 mg Oral Q1200 Vassie Loll, MD   40 mg at 08/27/12 1106  . [[facility] HOLD] Rivaroxaban (XARELTO) tablet 20 mg  20 mg Oral Q breakfast Gwen Her, RPH       Followed by  . [[facility] HOLD] Rivaroxaban (XARELTO) tablet 15 mg  15 mg Oral BID WC Gwen Her, RPH   15 mg at 08/28/12 1610   Facility-Administered Medications Ordered in Other Encounters  Medication Dose Route Frequency Provider Last Rate Last Dose  . midazolam (VERSED) 5 MG/5ML injection    Anesthesia Intra-op Jodell Cipro, CRNA   2 mg at 08/28/12 1355  . propofol (DIPRIVAN) 10 mg/mL bolus/IV push    Anesthesia Intra-op Jodell Cipro, CRNA   30 mg at 08/28/12 1355  .  propofol (DIPRIVAN) infusion 10 mg/ml EMUL    Continuous PRN Jodell Cipro, CRNA 130 mL/hr at 08/28/12 1355 120 mcg/kg/min at 08/28/12 1355    Physical Exam: General appearance: alert, cooperative and morbidly obese  Neck: no adenopathy, no carotid bruit, supple, symmetrical, trachea midline, thyroid not enlarged, symmetric, no tenderness/mass/nodules and Jugular venous pulsations are impossible to assess  Lungs: clear to auscultation bilaterally  Heart: irregularly irregular rhythm, S1, S2 normal and No murmurs, rubs or gallops  Abdomen: soft, non-tender; bowel sounds normal; no masses, no organomegaly and Obesity precludes a detailed examination  Extremities: extremities normal, atraumatic, no cyanosis or edema and Multiple scars surrounding left knee  Pulses: 2+  and symmetric  Skin: Skin color, texture, turgor normal. No rashes or lesions  Neurologic: Alert and oriented X 3, normal strength and tone. Normal symmetric reflexes. Normal coordination and gait   Lab Results: Results for orders placed during the hospital encounter of 08/26/12 (from the past 48 hour(s))  CBC     Status: Abnormal   Collection Time    08/26/12  2:30 PM      Result Value Range   WBC 5.9  4.0 - 10.5 K/uL   RBC 3.87 (*) 4.22 - 5.81 MIL/uL   Hemoglobin 11.0 (*) 13.0 - 17.0 g/dL   HCT 19.1 (*) 47.8 - 29.5 %   MCV 92.0  78.0 - 100.0 fL   MCH 28.4  26.0 - 34.0 pg   MCHC 30.9  30.0 - 36.0 g/dL   RDW 62.1 (*) 30.8 - 65.7 %   Platelets 254  150 - 400 K/uL  BASIC METABOLIC PANEL     Status: Abnormal   Collection Time    08/26/12  2:30 PM      Result Value Range   Sodium 140  135 - 145 mEq/L   Potassium 3.8  3.5 - 5.1 mEq/L   Chloride 107  96 - 112 mEq/L   CO2 20  19 - 32 mEq/L   Glucose, Bld 106 (*) 70 - 99 mg/dL   BUN 21  6 - 23 mg/dL   Creatinine, Ser 8.46  0.50 - 1.35 mg/dL   Calcium 9.8  8.4 - 96.2 mg/dL   GFR calc non Af Amer 73 (*) >90 mL/min   GFR calc Af Amer 85 (*) >90 mL/min   Comment:            The eGFR has been calculated     using the CKD EPI equation.     This calculation has not been     validated in all clinical     situations.     eGFR's persistently     <90 mL/min signify     possible Chronic Kidney Disease.  TROPONIN I     Status: None   Collection Time    08/26/12  2:30 PM      Result Value Range   Troponin I <0.30  <0.30 ng/mL   Comment:            Due to the release kinetics of cTnI,     a negative result within the first hours     of the onset of symptoms does not rule out     myocardial infarction with certainty.     If myocardial infarction is still suspected,     repeat the test at appropriate intervals.  MAGNESIUM     Status: None   Collection Time    08/26/12  2:30 PM  Result Value Range   Magnesium 2.0  1.5 - 2.5 mg/dL   PHOSPHORUS     Status: None   Collection Time    08/26/12  2:30 PM      Result Value Range   Phosphorus 3.9  2.3 - 4.6 mg/dL  HEMOGLOBIN Z6X     Status: None   Collection Time    08/26/12  2:30 PM      Result Value Range   Hemoglobin A1C 5.3  <5.7 %   Comment: (NOTE)                                                                               According to the ADA Clinical Practice Recommendations for 2011, when     HbA1c is used as a screening test:      >=6.5%   Diagnostic of Diabetes Mellitus               (if abnormal result is confirmed)     5.7-6.4%   Increased risk of developing Diabetes Mellitus     References:Diagnosis and Classification of Diabetes Mellitus,Diabetes     Care,2011,34(Suppl 1):S62-S69 and Standards of Medical Care in             Diabetes - 2011,Diabetes Care,2011,34 (Suppl 1):S11-S61.   Mean Plasma Glucose 105  <117 mg/dL  TSH     Status: None   Collection Time    08/26/12  5:20 PM      Result Value Range   TSH 4.147  0.350 - 4.500 uIU/mL  T4, FREE     Status: None   Collection Time    08/26/12  5:20 PM      Result Value Range   Free T4 1.48  0.80 - 1.80 ng/dL  APTT     Status: None   Collection Time    08/26/12  5:21 PM      Result Value Range   aPTT 30  24 - 37 seconds  PROTIME-INR     Status: None   Collection Time    08/26/12  5:21 PM      Result Value Range   Prothrombin Time 14.4  11.6 - 15.2 seconds   INR 1.14  0.00 - 1.49  URINE RAPID DRUG SCREEN (HOSP PERFORMED)     Status: None   Collection Time    08/26/12  5:31 PM      Result Value Range   Opiates NONE DETECTED  NONE DETECTED   Cocaine NONE DETECTED  NONE DETECTED   Benzodiazepines NONE DETECTED  NONE DETECTED   Amphetamines NONE DETECTED  NONE DETECTED   Tetrahydrocannabinol NONE DETECTED  NONE DETECTED   Barbiturates NONE DETECTED  NONE DETECTED   Comment:            DRUG SCREEN FOR MEDICAL PURPOSES     ONLY.  IF CONFIRMATION IS NEEDED     FOR ANY PURPOSE, NOTIFY LAB      WITHIN 5 DAYS.                LOWEST DETECTABLE LIMITS     FOR URINE DRUG SCREEN     Drug Class  Cutoff (ng/mL)     Amphetamine      1000     Barbiturate      200     Benzodiazepine   200     Tricyclics       300     Opiates          300     Cocaine          300     THC              50  URINALYSIS, ROUTINE W REFLEX MICROSCOPIC     Status: Abnormal   Collection Time    08/26/12  5:31 PM      Result Value Range   Color, Urine YELLOW  YELLOW   APPearance CLEAR  CLEAR   Specific Gravity, Urine >1.046 (*) 1.005 - 1.030   pH 5.5  5.0 - 8.0   Glucose, UA NEGATIVE  NEGATIVE mg/dL   Hgb urine dipstick NEGATIVE  NEGATIVE   Bilirubin Urine NEGATIVE  NEGATIVE   Ketones, ur NEGATIVE  NEGATIVE mg/dL   Protein, ur 295 (*) NEGATIVE mg/dL   Urobilinogen, UA 0.2  0.0 - 1.0 mg/dL   Nitrite NEGATIVE  NEGATIVE   Leukocytes, UA NEGATIVE  NEGATIVE  URINE MICROSCOPIC-ADD ON     Status: Abnormal   Collection Time    08/26/12  5:31 PM      Result Value Range   Squamous Epithelial / LPF RARE  RARE   WBC, UA 0-2  <3 WBC/hpf   Bacteria, UA RARE  RARE   Crystals CA OXALATE CRYSTALS (*) NEGATIVE  MRSA PCR SCREENING     Status: None   Collection Time    08/26/12  5:32 PM      Result Value Range   MRSA by PCR NEGATIVE  NEGATIVE   Comment:            The GeneXpert MRSA Assay (FDA     approved for NASAL specimens     only), is one component of a     comprehensive MRSA colonization     surveillance program. It is not     intended to diagnose MRSA     infection nor to guide or     monitor treatment for     MRSA infections.  GLUCOSE, CAPILLARY     Status: Abnormal   Collection Time    08/26/12 10:51 PM      Result Value Range   Glucose-Capillary 112 (*) 70 - 99 mg/dL  TROPONIN I     Status: None   Collection Time    08/26/12 11:40 PM      Result Value Range   Troponin I <0.30  <0.30 ng/mL   Comment:            Due to the release kinetics of cTnI,     a negative result within the first  hours     of the onset of symptoms does not rule out     myocardial infarction with certainty.     If myocardial infarction is still suspected,     repeat the test at appropriate intervals.  HEPARIN LEVEL (UNFRACTIONATED)     Status: Abnormal   Collection Time    08/26/12 11:40 PM      Result Value Range   Heparin Unfractionated <0.10 (*) 0.30 - 0.70 IU/mL   Comment:            IF HEPARIN RESULTS ARE BELOW  EXPECTED VALUES, AND PATIENT     DOSAGE HAS BEEN CONFIRMED,     SUGGEST FOLLOW UP TESTING     OF ANTITHROMBIN III LEVELS.  CBC     Status: Abnormal   Collection Time    08/27/12  5:34 AM      Result Value Range   WBC 5.2  4.0 - 10.5 K/uL   RBC 3.58 (*) 4.22 - 5.81 MIL/uL   Hemoglobin 10.2 (*) 13.0 - 17.0 g/dL   HCT 84.6 (*) 96.2 - 95.2 %   MCV 93.3  78.0 - 100.0 fL   MCH 28.5  26.0 - 34.0 pg   MCHC 30.5  30.0 - 36.0 g/dL   RDW 84.1 (*) 32.4 - 40.1 %   Platelets 218  150 - 400 K/uL  BASIC METABOLIC PANEL     Status: Abnormal   Collection Time    08/27/12  5:34 AM      Result Value Range   Sodium 140  135 - 145 mEq/L   Potassium 3.9  3.5 - 5.1 mEq/L   Chloride 108  96 - 112 mEq/L   CO2 23  19 - 32 mEq/L   Glucose, Bld 106 (*) 70 - 99 mg/dL   BUN 20  6 - 23 mg/dL   Creatinine, Ser 0.27  0.50 - 1.35 mg/dL   Calcium 9.0  8.4 - 25.3 mg/dL   GFR calc non Af Amer >90  >90 mL/min   GFR calc Af Amer >90  >90 mL/min   Comment:            The eGFR has been calculated     using the CKD EPI equation.     This calculation has not been     validated in all clinical     situations.     eGFR's persistently     <90 mL/min signify     possible Chronic Kidney Disease.  LIPID PANEL     Status: None   Collection Time    08/27/12  5:34 AM      Result Value Range   Cholesterol 109  0 - 200 mg/dL   Triglycerides 65  <664 mg/dL   HDL 45  >40 mg/dL   Total CHOL/HDL Ratio 2.4     VLDL 13  0 - 40 mg/dL   LDL Cholesterol 51  0 - 99 mg/dL   Comment:            Total  Cholesterol/HDL:CHD Risk     Coronary Heart Disease Risk Table                         Men   Women      1/2 Average Risk   3.4   3.3      Average Risk       5.0   4.4      2 X Average Risk   9.6   7.1      3 X Average Risk  23.4   11.0                Use the calculated Patient Ratio     above and the CHD Risk Table     to determine the patient's CHD Risk.                ATP III CLASSIFICATION (LDL):      <100     mg/dL   Optimal  100-129  mg/dL   Near or Above                        Optimal      130-159  mg/dL   Borderline      409-811  mg/dL   High      >914     mg/dL   Very High  TROPONIN I     Status: None   Collection Time    08/27/12  5:34 AM      Result Value Range   Troponin I <0.30  <0.30 ng/mL   Comment:            Due to the release kinetics of cTnI,     a negative result within the first hours     of the onset of symptoms does not rule out     myocardial infarction with certainty.     If myocardial infarction is still suspected,     repeat the test at appropriate intervals.  GLUCOSE, CAPILLARY     Status: Abnormal   Collection Time    08/27/12  7:39 AM      Result Value Range   Glucose-Capillary 105 (*) 70 - 99 mg/dL   Comment 1 Documented in Chart     Comment 2 Notify RN    HEPARIN LEVEL (UNFRACTIONATED)     Status: None   Collection Time    08/27/12  8:16 AM      Result Value Range   Heparin Unfractionated 0.55  0.30 - 0.70 IU/mL   Comment:            IF HEPARIN RESULTS ARE BELOW     EXPECTED VALUES, AND PATIENT     DOSAGE HAS BEEN CONFIRMED,     SUGGEST FOLLOW UP TESTING     OF ANTITHROMBIN III LEVELS.  GLUCOSE, CAPILLARY     Status: None   Collection Time    08/27/12 12:36 PM      Result Value Range   Glucose-Capillary 90  70 - 99 mg/dL   Comment 1 Documented in Chart     Comment 2 Notify RN    HEPARIN LEVEL (UNFRACTIONATED)     Status: None   Collection Time    08/27/12  2:14 PM      Result Value Range   Heparin Unfractionated 0.59  0.30  - 0.70 IU/mL   Comment:            IF HEPARIN RESULTS ARE BELOW     EXPECTED VALUES, AND PATIENT     DOSAGE HAS BEEN CONFIRMED,     SUGGEST FOLLOW UP TESTING     OF ANTITHROMBIN III LEVELS.  GLUCOSE, CAPILLARY     Status: Abnormal   Collection Time    08/27/12  4:11 PM      Result Value Range   Glucose-Capillary 105 (*) 70 - 99 mg/dL  GLUCOSE, CAPILLARY     Status: None   Collection Time    08/27/12  9:13 PM      Result Value Range   Glucose-Capillary 88  70 - 99 mg/dL  CBC     Status: Abnormal   Collection Time    08/28/12  4:34 AM      Result Value Range   WBC 4.6  4.0 - 10.5 K/uL   RBC 3.59 (*) 4.22 - 5.81 MIL/uL   Hemoglobin 10.2 (*) 13.0 - 17.0 g/dL   HCT 78.2 (*) 95.6 -  52.0 %   MCV 93.3  78.0 - 100.0 fL   MCH 28.4  26.0 - 34.0 pg   MCHC 30.4  30.0 - 36.0 g/dL   RDW 16.1 (*) 09.6 - 04.5 %   Platelets 196  150 - 400 K/uL  BASIC METABOLIC PANEL     Status: Abnormal   Collection Time    08/28/12  4:34 AM      Result Value Range   Sodium 140  135 - 145 mEq/L   Potassium 3.9  3.5 - 5.1 mEq/L   Chloride 108  96 - 112 mEq/L   CO2 25  19 - 32 mEq/L   Glucose, Bld 102 (*) 70 - 99 mg/dL   BUN 17  6 - 23 mg/dL   Creatinine, Ser 4.09  0.50 - 1.35 mg/dL   Calcium 9.1  8.4 - 81.1 mg/dL   GFR calc non Af Amer 87 (*) >90 mL/min   GFR calc Af Amer >90  >90 mL/min   Comment:            The eGFR has been calculated     using the CKD EPI equation.     This calculation has not been     validated in all clinical     situations.     eGFR's persistently     <90 mL/min signify     possible Chronic Kidney Disease.  GLUCOSE, CAPILLARY     Status: Abnormal   Collection Time    08/28/12  7:52 AM      Result Value Range   Glucose-Capillary 111 (*) 70 - 99 mg/dL    Imaging: Imaging results have been reviewed and Ct Angio Chest Pe W/cm &/or Wo Cm  08/26/2012   *RADIOLOGY REPORT*  Clinical Data: Short of breath, substernal chest pain  CT ANGIOGRAPHY CHEST  Technique:  Multidetector  CT imaging of the chest using the standard protocol during bolus administration of intravenous contrast. Multiplanar reconstructed images including MIPs were obtained and reviewed to evaluate the vascular anatomy.  Contrast: OMNIPAQUE IOHEXOL 350 MG/ML SOLN  Comparison: CT thorax 06/03/2004.  Findings: There are no filling defects within the pulmonary arteries to suggest acute pulmonary embolism.  No acute findings aorta great vessels.  Right main pulmonary artery is mildly enlarged at 35 mm.  Coronary calcifications are present.  Review lung parenchyma demonstrates small bilateral pleural effusions greater on the right.  There is mild ground-glass opacities in the lungs suggesting mild edema.  There is a subpleural nodule in the right upper lobe measuring 4 mm which is likely benign.  Smaller parenchymal nodule measuring 4 mm on image 23 in the right upper lobe.  There are several additional sub 5 mm nodules in the right middle lobe and right upper lobe.  There is a remote scan from 06/03/2004, CT chest which also demonstrates small nodules in the right upper lobe the right middle lobe.  Limited view of the upper abdomen is unremarkable.  Limited view of the skeleton is unremarkable.  There is degenerative spurring of the thoracic spine.  IMPRESSION:  1.  No evidence of acute pulmonary embolism. 2.  Bilateral small to moderate effusions, greater on the right. Mild pulmonary edema.  3.  Essentially stable small pulmonary nodule in the right likely relate to granulomatous disease. 4.  Coronary artery calcifications present. 5.  Dilatation of the right main pulmonary artery may represent pulmonary hypertension.   Original Report Authenticated By: Genevive Bi, M.D.   Dg  Chest Port 1 View  08/26/2012   *RADIOLOGY REPORT*  Clinical Data: Shortness of breath and chest pain.  PORTABLE CHEST - 1 VIEW  Comparison: PA and lateral chest 06/14/2012 and 08/22/2009.  Findings: Lung volumes are low but the lungs are  clear.  Heart size is upper normal.  No pneumothorax or pleural fluid.  IMPRESSION: No acute disease.   Original Report Authenticated By: [name] Dexter, M.D.    Assessment:  1. Principal Problem: 2.   Atrial fibrillation 3. Active Problems: 4.   DM 5.   Morbid obesity 6.   SOB (shortness of breath) 7.   Chest discomfort 8.   Plan:  1. Start sotalol 120 mg BID and monitor for 72 hours. If he remains in atrial fibrillation (as I suspect he will) will re-attempt electrical cardioversion at the end of the week.  Time Spent Directly with Patient:  35 minutes  Length of Stay:  LOS: 2 days    [name] 08/28/2012, 2:24 PM

## 2012-08-28 NOTE — Transfer of Care (Signed)
Immediate Anesthesia Transfer of Care Note  Patient: [name]  Procedure(s) Performed: Procedure(s): TRANSESOPHAGEAL ECHOCARDIOGRAM (TEE) (N/A) CARDIOVERSION (N/A)  Patient Location: PACU  Anesthesia Type:General  Level of Consciousness: awake, alert  and oriented  Airway & Oxygen Therapy: Patient Spontanous Breathing and Patient connected to nasal cannula oxygen  Post-op Assessment: Report given to PACU RN, Post -op Vital signs reviewed and stable and Patient moving all extremities  Post vital signs: Reviewed and stable  Complications: No apparent anesthesia complications

## 2012-08-28 NOTE — Progress Notes (Signed)
Echocardiogram Echocardiogram Transesophageal has been performed.  [name] 08/28/2012, 2:32 PM

## 2012-08-28 NOTE — Op Note (Signed)
Procedure: Electrical Cardioversion Indications:  Atrial Fibrillation  Procedure Details:  Consent: Risks of procedure as well as the alternatives and risks of each were explained to the (patient/caregiver).  Consent for procedure obtained.  Time Out: Verified patient identification, verified procedure, site/side was marked, verified correct patient position, special equipment/implants available, medications/allergies/relevent history reviewed, required imaging and test results available.  Performed  Patient placed on cardiac monitor, pulse oximetry, supplemental oxygen as necessary.  Sedation given: propofol IV, anestesiology administered Pacer pads placed anterior and posterior chest.  Cardioverted 3 time(s).  Cardioverted at 200J. Synchronized biphasic  Evaluation: Findings: Post procedure EKG shows: Atrial Fibrillation - there was not even one beat of NSR. Complications: None Patient did tolerate procedure well.  Time Spent Directly with the Patient:  30 minutes   [name] 08/28/2012, 2:29 PM

## 2012-08-28 NOTE — Preoperative (Signed)
Beta Blockers   Reason not to administer Beta Blockers:Not Applicable 

## 2012-08-28 NOTE — Progress Notes (Signed)
[facility] PROGRESS NOTE  [name] UJW:[id number] DOB: 1950-03-17 DOA: 08/26/2012 PCP: Aura Dials, MD  Assessment/Plan: 1-SOB, chest discomfort and new onset of atrial fibrillation: differential includes PE, ACS and abnormal nodal dysfunction as cause a. Fib.  -CTA negative for PE -patient now on cardizem 360 mg daily and xarelto -attempted cardioversion X 3 today without success; per cardiology will use sotalol 120 mg BID for 72 hours and if still on a. Fib will try TEE cardioversion again.  -no signs of infection; UDS, UA, lipid profile, TSH and free T4 WNL -no further chest discomfort and CE'z negative X3 -continue monitoring on telemetry bed  2-DM: currently following just diet as an outpatient. A1C 5.3; continue SSI; but not indication for outpatient hypoglycemic agents.  3-Morbid obesity: counseling about low calorie diet, weight loss and increase exercise after this acute event provided.   4-Chest discomfort: as mentioned above. Will also continue  PPI as patient high risk for GERD.  -patient denies any further chest pain  5-hypothyroidism: will continue synthroid for now. TSH and free T4 WNL  6-pulmonary HTN and mild diastolic/systolic heart failure: will need sleep study as an outpatient; continue heart healthy diet and close follow up to I's and O's. -will follow cardiology recommendations for need of further evaluation and treatment (cath, and need of Ace/ARB)  7-Presumed DVT: currently on xarelto.  DVT: on xarelto  Code Status: Full Family Communication: no family at bedisde Disposition Plan: will transfer to cardiology service (Dr. Royann Shivers); continue telemetry monitoring.   Consultants:  Cardiology   Procedures:  LE dopplers (undetermine DVT, due to body habitus)  2-D Echo (EF 45-50%, diffuse hypokinesis; mild LVH; unable to assess diastolic function given atrial fibrillation)  TEE (no left atrial clots, global hypokinesis, EF 40-45%, 2+MR and  3+TR)  Antibiotics: None  HPI/Subjective: Denies CP, feeling better. HR now controlled at rest and with mild tachycardia with activity. Status post 3 cardioversion attempt and still on a. Fib.  Objective: Filed Vitals:   08/28/12 1530 08/28/12 1545 08/28/12 1600 08/28/12 1631  BP:  125/84  123/79  Pulse: 83 89 89 50  Temp:   97.2 F (36.2 C) 97.9 F (36.6 C)  TempSrc:    Oral  Resp: 25 28 24 21   Height:      Weight:      SpO2: 96% 99% 99% 96%    Intake/Output Summary (Last 24 hours) at 08/28/12 1700 Last data filed at 08/28/12 1600  Gross per 24 hour  Intake 1300.67 ml  Output    400 ml  Net 900.67 ml   Filed Weights   08/27/12 0500 08/28/12 0500  Weight: 180.169 kg (397 lb 3.2 oz) 180.486 kg (397 lb 14.4 oz)    Exam:   General:  NAD, afebrile; denies CP; continue to be in atrial fibrillation  Cardiovascular: rate controlled (highest 105), no rubs, no gallops, irregular rhythm and A. Fib on telemetry   Respiratory: CTA bilaterally  Abdomen: trace edema bilaterally  Musculoskeletal: no joint swelling or erythema  Data Reviewed: Basic Metabolic Panel:  Recent Labs Lab 08/26/12 1430 08/27/12 0534 08/28/12 0434  NA 140 140 140  K 3.8 3.9 3.9  CL 107 108 108  CO2 20 23 25   GLUCOSE 106* 106* 102*  BUN 21 20 17   CREATININE 1.07 0.89 0.97  CALCIUM 9.8 9.0 9.1  MG 2.0  --   --   PHOS 3.9  --   --    CBC:  Recent Labs Lab  08/26/12 1430 08/27/12 0534 08/28/12 0434  WBC 5.9 5.2 4.6  HGB 11.0* 10.2* 10.2*  HCT 35.6* 33.4* 33.5*  MCV 92.0 93.3 93.3  PLT 254 218 196   Cardiac Enzymes:  Recent Labs Lab 08/26/12 1430 08/26/12 2340 08/27/12 0534  TROPONINI <0.30 <0.30 <0.30   CBG:  Recent Labs Lab 08/27/12 1611 08/27/12 2113 08/28/12 0752 08/28/12 1436 08/28/12 1634  GLUCAP 105* 88 111* 94 80    Recent Results (from the past 240 hour(s))  MRSA PCR SCREENING     Status: None   Collection Time    08/26/12  5:32 PM      Result Value  Range Status   MRSA by PCR NEGATIVE  NEGATIVE Final   Comment:            The GeneXpert MRSA Assay (FDA     approved for NASAL specimens     only), is one component of a     comprehensive MRSA colonization     surveillance program. It is not     intended to diagnose MRSA     infection nor to guide or     monitor treatment for     MRSA infections.     Studies: No results found.  Scheduled Meds: . diltiazem  360 mg Oral Daily  . insulin aspart  0-9 Units Subcutaneous TID WC  . levothyroxine  100 mcg Oral QAC breakfast  . multivitamin with minerals  1 tablet Oral Daily  . pantoprazole  40 mg Oral Q1200  . [START ON 09/18/2012] rivaroxaban  20 mg Oral Q breakfast   Followed by  . rivaroxaban  15 mg Oral BID WC  . sotalol  160 mg Oral Q12H   Continuous Infusions: . sodium chloride      Principal Problem:   Atrial fibrillation Active Problems:   DM   Morbid obesity   SOB (shortness of breath)   Chest discomfort    Time spent: >30 minutes    Dela Sweeny  [facility] Pager [phone]. If 7PM-7AM, please contact night-coverage at [email or web address], password [facility] 08/28/2012, 5:00 PM  LOS: 2 days

## 2012-08-28 NOTE — Progress Notes (Signed)
Report called to Orvan July RN [phone]) at [facility], [facility] contacted, waiting for transportation to send patient back to [facility] hospital.  Will be here 20-30 minutes

## 2012-08-28 NOTE — Progress Notes (Signed)
Sat on side of stretcher to void, assisted to transport stretcher

## 2012-08-28 NOTE — Progress Notes (Signed)
Dr. Krista Blue called to see if any particular length of time he wants patient to stay in PACU . He said patient can be discharged as if he was going to floor. Patient is stable pain2/10 does not want any medication when offered [facility] OR ROOM 09 any ice chips or anything to drink.

## 2012-08-28 NOTE — Op Note (Signed)
INDICATIONS: Precardioversion, Atrial Fibrillation  PROCEDURE:   Informed consent was obtained prior to the procedure. The risks, benefits and alternatives for the procedure were discussed and the patient comprehended these risks.  Risks include, but are not limited to, cough, sore throat, vomiting, nausea, somnolence, esophageal and stomach trauma or perforation, bleeding, low blood pressure, aspiration, pneumonia, infection, trauma to the teeth and death.    After a procedural time-out, the oropharynx was anesthetized with 20% benzocaine spray. The patient was given IV propofol by the CRNA for moderate sedation.   The transesophageal probe was inserted in the esophagus and stomach without difficulty and multiple views were obtained.  The patient was kept under observation until the patient left the procedure room.  The patient left the procedure room in stable condition.   Agitated microbubble saline contrast was not administered.  COMPLICATIONS:    There were no immediate complications.  FINDINGS:  No LA clot. Global LV hypokinesis, EF40-45% Biatrial dilatation 2+ MR 3+ TR  RECOMMENDATIONS:     Proceed with cardioversion  Time Spent Directly with the Patient:  30 minutes   [name] 08/28/2012, 2:31 PM

## 2012-08-28 NOTE — Progress Notes (Signed)
Patient has a bradycardic nonsustained episode at 0325 . HR recorded to have dropped to 39. Patient was sleeping during that time. Easily arousible. Hr went back to 80s. Will continue to monitor patient ans respond to all alarms.

## 2012-08-28 NOTE — Progress Notes (Signed)
Nutrition Brief Note  Patient identified on the Malnutrition Screening Tool (MST) Report  Body mass index is 60.51 kg/(m^2). Patient meets criteria for Morbid Obesity based on current BMI.   Wt Readings from Last 3 Encounters:  08/28/12 397 lb 14.4 oz (180.486 kg)  08/28/12 397 lb 14.4 oz (180.486 kg)  06/23/12 385 lb (174.635 kg)   Attempted to visit pt 3 times today but, pt at [facility] for procedure. Per weight history pt has gained 12 lbs in the past 2 months. Per nursing notes pt ate 100% of meal last night.  Current diet order is NPO. Labs and medications reviewed.  No nutrition interventions warranted at this time. Will attempt to follow-up with pt at another time.  Ian Malkin RD, LDN Inpatient Clinical Dietitian Pager: [phone] After Hours Pager: [phone]

## 2012-08-29 ENCOUNTER — Encounter (HOSPITAL_COMMUNITY): Payer: Self-pay | Admitting: Cardiovascular Disease

## 2012-08-29 LAB — CBC
MCH: 28.3 pg (ref 26.0–34.0)
MCHC: 30.3 g/dL (ref 30.0–36.0)
Platelets: 242 10*3/uL (ref 150–400)

## 2012-08-29 LAB — GLUCOSE, CAPILLARY
Glucose-Capillary: 128 mg/dL — ABNORMAL HIGH (ref 70–99)
Glucose-Capillary: 93 mg/dL (ref 70–99)
Glucose-Capillary: 92 mg/dL (ref 70–99)

## 2012-08-29 LAB — BASIC METABOLIC PANEL
BUN: 19 mg/dL (ref 6–23)
Calcium: 9.5 mg/dL (ref 8.4–10.5)
GFR calc Af Amer: >90 mL/min (ref >90)
GFR calc non Af Amer: 87 mL/min — ABNORMAL LOW (ref >90)
Glucose, Bld: 113 mg/dL — ABNORMAL HIGH (ref 70–99)
Potassium: 3.8 mEq/L (ref 3.5–5.1)
Sodium: 140 mEq/L (ref 135–145)

## 2012-08-29 MED ORDER — POTASSIUM CHLORIDE CRYS ER 20 MEQ PO TBCR
40.0000 meq | EXTENDED_RELEASE_TABLET | Freq: Once | ORAL | Status: AC
  Administered 2012-08-29: 40 meq via ORAL
  Filled 2012-08-29: qty 2

## 2012-08-29 MED ORDER — SODIUM CHLORIDE 0.9 % IJ SOLN: 3.0000 mL | Freq: Two times a day (BID) | INTRAMUSCULAR | Status: DC

## 2012-08-29 MED ORDER — NITROGLYCERIN 0.4 MG SL SUBL
SUBLINGUAL_TABLET | SUBLINGUAL | Status: AC
  Filled 2012-08-29: qty 25

## 2012-08-29 MED ORDER — SOTALOL HCL 80 MG PO TABS
80.0000 mg | ORAL_TABLET | Freq: Two times a day (BID) | ORAL | Status: DC
  Administered 2012-08-29 – 2012-08-30 (×3): 80 mg via ORAL
  Filled 2012-08-29 (×4): qty 1

## 2012-08-29 MED ORDER — SODIUM CHLORIDE 0.9 % IV SOLN: 250.0000 mL | INTRAVENOUS | Status: DC

## 2012-08-29 MED ORDER — SODIUM CHLORIDE 0.9 % IJ SOLN: 3.0000 mL | INTRAMUSCULAR | Status: DC | PRN

## 2012-08-29 NOTE — Progress Notes (Signed)
See my additional note later same day.

## 2012-08-29 NOTE — Progress Notes (Signed)
Pt. C/o mild chest pressure.  States that he can't rate it on a scale of 1-10.  VS are: Temp-98.30F, BP- 100/42mm/Hg, Pulse-67bpm, Resp-18, Sat.- 97% on room air.  12-lead EKG obtained showing a-fib with PVC's & prolonged QT.  Pt. Placed on 2 LPM & SL NTG left at bedside.  Pt. Instructed to call for nurse if CP becomes worse; verbalizes understanding.  Pt. Has received so far today 360mg  Diltiazem ER & 80mg  Sotalol (which was decreased from 160mg  today).  Pt. For possible cardioversion within the next few days.  Pt. Being anti-coagulated with Xarelto.  Will continue to monitor.

## 2012-08-29 NOTE — Progress Notes (Signed)
I have seen and examined the patient along with Wilburt Finlay, PA.  I have reviewed the chart, notes and new data.  I agree with PA's note. Interim changes over the day included here  Key new complaints: chest pressure resolved after oxygen administration; "felt funny" and overall not severe. Breathing is unchanged Key examination changes: remains in AF, now with VR 60-70 at rest. Note marked bradycardia during sleep Key new findings / data: QTc prolongation was excessive, sotalol dose reduced, more recent QTc at 3PM was 493 ms  PLAN: Repeat attempt at DCCV on Thursday morning (will have achieved sotalol concentration "steady state" by then). Continue QT monitoring. Stop IV fluids.  Thurmon Fair, MD, [facility] [phone] 08/29/2012, 4:53 PM

## 2012-08-29 NOTE — Progress Notes (Signed)
Spoke with Taryn @ [facility], transport arranged for Friday 09/01/12 10 am to [facility] short stay for cardioversion.

## 2012-08-29 NOTE — Progress Notes (Signed)
  The patient is scheduled for cardioversion at [facility] this coming Friday at 11 AM with Dr. Rennis Golden.  Please make sure he is transported, and arrives at short stay one hour prior to the procedure.  Chantavia Bazzle 3:07 PM

## 2012-08-29 NOTE — Progress Notes (Signed)
Subjective: Gets SOB above baseline when walking.  Mildly dizzy  Objective: Vital signs in last 24 hours: Temp:  [97 F (36.1 C)-98.4 F (36.9 C)] 97.8 F (36.6 C) (07/22 0434) Pulse Rate:  [50-105] 77 (07/22 0434) Resp:  [21-28] 22 (07/22 0434) BP: (97-142)/(58-84) 142/80 mmHg (07/22 0434) SpO2:  [95 %-99 %] 95 % (07/22 0434) Weight:  [393 lb 11.9 oz (178.6 kg)] 393 lb 11.9 oz (178.6 kg) (07/22 0434) Last BM Date:  (pta)  Intake/Output from previous day: 07/21 0701 - 07/22 0700 In: 1021.7 [P.O.:720; I.V.:201.7] Out: 400 [Urine:400] Intake/Output this shift:    Medications Current Facility-Administered Medications  Medication Dose Route Frequency Provider Last Rate Last Dose  . 0.9 %  sodium chloride infusion   Intravenous Continuous Vassie Loll, MD 20 mL/hr at 08/29/12 0700    . acetaminophen (TYLENOL) tablet 650 mg  650 mg Oral Q6H PRN Vassie Loll, MD   650 mg at 08/27/12 1903   Or  . acetaminophen (TYLENOL) suppository 650 mg  650 mg Rectal Q6H PRN Vassie Loll, MD      . diltiazem (CARDIZEM CD) 24 hr capsule 360 mg  360 mg Oral Daily Wilburt Finlay, PA-C   360 mg at 08/28/12 0957  . HYDROmorphone (DILAUDID) injection 1 mg  1 mg Intravenous Q4H PRN Vassie Loll, MD   1 mg at 08/28/12 2251  . insulin aspart (novoLOG) injection 0-9 Units  0-9 Units Subcutaneous TID WC Vassie Loll, MD      . levothyroxine (SYNTHROID, LEVOTHROID) tablet 100 mcg  100 mcg Oral QAC breakfast Vassie Loll, MD   100 mcg at 08/29/12 1610  . multivitamin with minerals tablet 1 tablet  1 tablet Oral Daily Vassie Loll, MD   1 tablet at 08/28/12 0957  . ondansetron (ZOFRAN) tablet 4 mg  4 mg Oral Q6H PRN Vassie Loll, MD       Or  . ondansetron [facility]) injection 4 mg  4 mg Intravenous Q6H PRN Vassie Loll, MD      . pantoprazole (PROTONIX) EC tablet 40 mg  40 mg Oral Q1200 Vassie Loll, MD   40 mg at 08/27/12 1106  . [START ON 09/18/2012] Rivaroxaban (XARELTO) tablet 20 mg  20 mg Oral Q  breakfast Gwen Her, RPH       Followed by  . Rivaroxaban (XARELTO) tablet 15 mg  15 mg Oral BID WC Gwen Her, RPH   15 mg at 08/29/12 9604  . sotalol (BETAPACE) tablet 160 mg  160 mg Oral Q12H Mihai Croitoru, MD   160 mg at 08/28/12 1722    PE: General appearance: alert, cooperative and no distress Lungs: clear to auscultation bilaterally Heart: irregularly irregular rhythm Extremities: No LEE Pulses: 2+ and symmetric Neurologic: Grossly normal  Lab Results:   Recent Labs  08/27/12 0534 08/28/12 0434 08/29/12 0503  WBC 5.2 4.6 5.0  HGB 10.2* 10.2* 10.8*  HCT 33.4* 33.5* 35.6*  PLT 218 196 242   BMET  Recent Labs  08/27/12 0534 08/28/12 0434 08/29/12 0503  NA 140 140 140  K 3.9 3.9 3.8  CL 108 108 107  CO2 23 25 24   GLUCOSE 106* 102* 113*  BUN 20 17 19   CREATININE 0.89 0.97 0.99  CALCIUM 9.0 9.1 9.5   PT/INR  Recent Labs  08/26/12 1721  LABPROT 14.4  INR 1.14   Cholesterol  Recent Labs  08/27/12 0534  CHOL 109   Assessment/Plan  Principal Problem:   Atrial fibrillation Active Problems:  DM   Morbid obesity   SOB (shortness of breath)   Chest discomfort  Plan:  SP three unsuccessful attempts at DCCV.  TEE shows Global LV hypokinesis, EF40-45%. Biatrial dilatation 2+ MR, 3+ TR.  Now on sotalol 120mg   reattempt DCCV after 72 hours of sotalol.  Still in controlled Afib on tele. EKG this morning shows QTc of .  On July 19 it was .  Decreased sotalol to 80mg  bid.  Recheck EKG Daily.  Mag 2.1    LOS: 3 days    [name] 08/29/2012 9:22 AM

## 2012-08-30 ENCOUNTER — Other Ambulatory Visit: Payer: Self-pay

## 2012-08-30 LAB — GLUCOSE, CAPILLARY: Glucose-Capillary: 112 mg/dL — ABNORMAL HIGH (ref 70–99)

## 2012-08-30 LAB — COMPREHENSIVE METABOLIC PANEL
Alkaline Phosphatase: 63 U/L (ref 39–117)
BUN: 17 mg/dL (ref 6–23)
Chloride: 105 mEq/L (ref 96–112)
Creatinine, Ser: 0.93 mg/dL (ref 0.50–1.35)
GFR calc Af Amer: >90 mL/min (ref >90)
GFR calc non Af Amer: 89 mL/min — ABNORMAL LOW (ref >90)
Glucose, Bld: 112 mg/dL — ABNORMAL HIGH (ref 70–99)
Potassium: 4.3 mEq/L (ref 3.5–5.1)
Total Bilirubin: 0.6 mg/dL (ref 0.3–1.2)

## 2012-08-30 LAB — CBC
HCT: 36.9 % — ABNORMAL LOW (ref 39.0–52.0)
Hemoglobin: 11.2 g/dL — ABNORMAL LOW (ref 13.0–17.0)
MCV: 94.1 fL (ref 78.0–100.0)
WBC: 6.2 10*3/uL (ref 4.0–10.5)

## 2012-08-30 MED ORDER — SOTALOL HCL 120 MG PO TABS
120.0000 mg | ORAL_TABLET | Freq: Two times a day (BID) | ORAL | Status: DC
  Administered 2012-08-30: 120 mg via ORAL
  Filled 2012-08-30 (×3): qty 1

## 2012-08-30 MED ORDER — DILTIAZEM HCL ER COATED BEADS 240 MG PO CP24
240.0000 mg | ORAL_CAPSULE | Freq: Once | ORAL | Status: AC
  Administered 2012-08-30: 240 mg via ORAL
  Filled 2012-08-30: qty 1

## 2012-08-30 MED ORDER — DILTIAZEM HCL ER COATED BEADS 240 MG PO CP24
240.0000 mg | ORAL_CAPSULE | Freq: Every day | ORAL | Status: DC
  Administered 2012-08-31 – 2012-09-02 (×3): 240 mg via ORAL
  Filled 2012-08-30 (×3): qty 1

## 2012-08-30 NOTE — Progress Notes (Signed)
DAILY PROGRESS NOTE  Subjective:  No issues overnight. Resting comfortably. Sotalol decreased from 160 mg to 80 mg due to QTC at 495. QTc today is 485.   Objective:  Temp:  [97.8 F (36.6 C)-98.3 F (36.8 C)] 98.3 F (36.8 C) (07/23 1437) Pulse Rate:  [51-80] 79 (07/23 1437) Resp:  [16-20] 16 (07/23 0452) BP: (97-124)/(68-85) 109/68 mmHg (07/23 1437) SpO2:  [97 %-98 %] 97 % (07/23 1437) Weight:  [393 lb 4.8 oz (178.4 kg)] 393 lb 4.8 oz (178.4 kg) (07/23 0452) Weight change: -7.1 oz (-0.2 kg)  Intake/Output from previous day: 07/22 0701 - 07/23 0700 In: 790 [P.O.:790] Out: -   Intake/Output from this shift:    Medications: Current Facility-Administered Medications  Medication Dose Route Frequency Provider Last Rate Last Dose  . acetaminophen (TYLENOL) tablet 650 mg  650 mg Oral Q6H PRN Vassie Loll, MD   650 mg at 08/27/12 1903   Or  . acetaminophen (TYLENOL) suppository 650 mg  650 mg Rectal Q6H PRN Vassie Loll, MD      . Melene Muller ON 08/31/2012] diltiazem (CARDIZEM CD) 24 hr capsule 240 mg  240 mg Oral Daily Wilburt Finlay, PA-C      . HYDROmorphone (DILAUDID) injection 1 mg  1 mg Intravenous Q4H PRN Vassie Loll, MD   1 mg at 08/29/12 2135  . insulin aspart (novoLOG) injection 0-9 Units  0-9 Units Subcutaneous TID WC Vassie Loll, MD      . levothyroxine (SYNTHROID, LEVOTHROID) tablet 100 mcg  100 mcg Oral QAC breakfast Vassie Loll, MD   100 mcg at 08/30/12 0757  . multivitamin with minerals tablet 1 tablet  1 tablet Oral Daily Vassie Loll, MD   1 tablet at 08/30/12 1037  . ondansetron (ZOFRAN) tablet 4 mg  4 mg Oral Q6H PRN Vassie Loll, MD       Or  . ondansetron [facility]) injection 4 mg  4 mg Intravenous Q6H PRN Vassie Loll, MD      . pantoprazole (PROTONIX) EC tablet 40 mg  40 mg Oral Q1200 Vassie Loll, MD   40 mg at 08/30/12 1146  . [START ON 09/18/2012] Rivaroxaban (XARELTO) tablet 20 mg  20 mg Oral Q breakfast Gwen Her, RPH       Followed by  .  Rivaroxaban (XARELTO) tablet 15 mg  15 mg Oral BID WC Gwen Her, RPH   15 mg at 08/30/12 0756  . sotalol (BETAPACE) tablet 80 mg  80 mg Oral Q12H Wilburt Finlay, PA-C   80 mg at 08/30/12 1038    Physical Exam: General appearance: alert and no distress Neck: no adenopathy, no carotid bruit, supple, symmetrical, trachea midline, thyroid not enlarged, symmetric, no tenderness/mass/nodules and very thick neck Lungs: diminished breath sounds bilaterally Heart: irregularly irregular rhythm Abdomen: morbidly obese, soft, non-tender Extremities: extremities normal, atraumatic, no cyanosis or edema Pulses: 2+ and symmetric Skin: Skin color, texture, turgor normal. No rashes or lesions Neurologic: Grossly normal  Lab Results: Results for orders placed during the hospital encounter of 08/26/12 (from the past 48 hour(s))  GLUCOSE, CAPILLARY     Status: Abnormal   Collection Time    08/28/12  9:01 PM      Result Value Range   Glucose-Capillary 128 (*) 70 - 99 mg/dL  BASIC METABOLIC PANEL     Status: Abnormal   Collection Time    08/29/12  5:03 AM      Result Value Range   Sodium 140  135 - 145 mEq/L  Potassium 3.8  3.5 - 5.1 mEq/L   Chloride 107  96 - 112 mEq/L   CO2 24  19 - 32 mEq/L   Glucose, Bld 113 (*) 70 - 99 mg/dL   BUN 19  6 - 23 mg/dL   Creatinine, Ser 1.61  0.50 - 1.35 mg/dL   Calcium 9.5  8.4 - 09.6 mg/dL   GFR calc non Af Amer 87 (*) >90 mL/min   GFR calc Af Amer >90  >90 mL/min   Comment:            The eGFR has been calculated     using the CKD EPI equation.     This calculation has not been     validated in all clinical     situations.     eGFR's persistently     <90 mL/min signify     possible Chronic Kidney Disease.  CBC     Status: Abnormal   Collection Time    08/29/12  5:03 AM      Result Value Range   WBC 5.0  4.0 - 10.5 K/uL   RBC 3.81 (*) 4.22 - 5.81 MIL/uL   Hemoglobin 10.8 (*) 13.0 - 17.0 g/dL   HCT 04.5 (*) 40.9 - 81.1 %   MCV 93.4  78.0 - 100.0  fL   MCH 28.3  26.0 - 34.0 pg   MCHC 30.3  30.0 - 36.0 g/dL   RDW 91.4 (*) 78.2 - 95.6 %   Platelets 242  150 - 400 K/uL  MAGNESIUM     Status: None   Collection Time    08/29/12  5:03 AM      Result Value Range   Magnesium 2.1  1.5 - 2.5 mg/dL  GLUCOSE, CAPILLARY     Status: None   Collection Time    08/29/12  7:14 AM      Result Value Range   Glucose-Capillary 93  70 - 99 mg/dL  GLUCOSE, CAPILLARY     Status: None   Collection Time    08/29/12 11:57 AM      Result Value Range   Glucose-Capillary 98  70 - 99 mg/dL  GLUCOSE, CAPILLARY     Status: None   Collection Time    08/29/12  4:58 PM      Result Value Range   Glucose-Capillary 92  70 - 99 mg/dL  GLUCOSE, CAPILLARY     Status: Abnormal   Collection Time    08/29/12  9:18 PM      Result Value Range   Glucose-Capillary 114 (*) 70 - 99 mg/dL  CBC     Status: Abnormal   Collection Time    08/30/12  4:50 AM      Result Value Range   WBC 6.2  4.0 - 10.5 K/uL   RBC 3.92 (*) 4.22 - 5.81 MIL/uL   Hemoglobin 11.2 (*) 13.0 - 17.0 g/dL   HCT 21.3 (*) 08.6 - 57.8 %   MCV 94.1  78.0 - 100.0 fL   MCH 28.6  26.0 - 34.0 pg   MCHC 30.4  30.0 - 36.0 g/dL   RDW 46.9 (*) 62.9 - 52.8 %   Platelets 249  150 - 400 K/uL  COMPREHENSIVE METABOLIC PANEL     Status: Abnormal   Collection Time    08/30/12  4:50 AM      Result Value Range   Sodium 137  135 - 145 mEq/L   Potassium 4.3  3.5 -  5.1 mEq/L   Chloride 105  96 - 112 mEq/L   CO2 24  19 - 32 mEq/L   Glucose, Bld 112 (*) 70 - 99 mg/dL   BUN 17  6 - 23 mg/dL   Creatinine, Ser 9.56  0.50 - 1.35 mg/dL   Calcium 9.6  8.4 - 21.3 mg/dL   Total Protein 6.3  6.0 - 8.3 g/dL   Albumin 3.4 (*) 3.5 - 5.2 g/dL   AST 23  0 - 37 U/L   ALT 49  0 - 53 U/L   Alkaline Phosphatase 63  39 - 117 U/L   Total Bilirubin 0.6  0.3 - 1.2 mg/dL   GFR calc non Af Amer 89 (*) >90 mL/min   GFR calc Af Amer >90  >90 mL/min   Comment:            The eGFR has been calculated     using the CKD EPI equation.      This calculation has not been     validated in all clinical     situations.     eGFR's persistently     <90 mL/min signify     possible Chronic Kidney Disease.  MAGNESIUM     Status: None   Collection Time    08/30/12  4:50 AM      Result Value Range   Magnesium 2.2  1.5 - 2.5 mg/dL  GLUCOSE, CAPILLARY     Status: Abnormal   Collection Time    08/30/12  7:29 AM      Result Value Range   Glucose-Capillary 114 (*) 70 - 99 mg/dL  GLUCOSE, CAPILLARY     Status: None   Collection Time    08/30/12 11:34 AM      Result Value Range   Glucose-Capillary 99  70 - 99 mg/dL    Imaging: No results found.  Assessment:  1. Principal Problem: 2.   Atrial fibrillation 3. Active Problems: 4.   DM 5.   Morbid obesity 6.   SOB (shortness of breath) 7.   Chest discomfort 8.   Plan:  1. I think he can tolerate a little higher dose of sotalol - I'm afraid 80 mg BID may not provide much more anti-arryhthmic benefit. Will increase to 120 mg BID starting tonight. Check 12 lead EKG tomorrow. Plan for cardioversion in the cath lab (with higher current defibrillator and anesthesia) on Friday. Patient and I have signed informed consent.  High risk for OSA. Will order informal overnight hospital oximetry study while he is waiting for cardioversion.  Time Spent Directly with Patient:  15 minutes  Length of Stay:  LOS: 4 days   Chrystie Nose, MD, [facility] Attending Cardiologist [facility]  Reece Fehnel C 08/30/2012, 5:12 PM

## 2012-08-30 NOTE — Care Management Note (Addendum)
    Page 1 of 1   09/04/2012     1:01:48 PM   CARE MANAGEMENT NOTE 09/04/2012  Patient:  [name], [name]   Account Number:  [id number]  Date Initiated:  08/27/2012  Documentation initiated by:  [facility]  Subjective/Objective Assessment:   62 year old male admitted with newly dx afib.     Action/Plan:   From home.   Anticipated DC Date:  09/02/2012   Anticipated DC Plan:  HOME/SELF CARE      DC Planning Services  CM consult      Choice offered to / List presented to:             Status of service:  Completed, signed off Medicare Important Message given?  NA - LOS <3 / Initial given by admissions (If response is "NO", the following Medicare IM given date fields will be blank) Date Medicare IM given:   Date Additional Medicare IM given:    Discharge Disposition:  HOME/SELF CARE  Per UR Regulation:  Reviewed for med. necessity/level of care/duration of stay  If discussed at Long Length of Stay Meetings, dates discussed:    Comments:  09/04/12 Nyari Olsson RN,BSN NCM [phone] UPON REVIEW NO D/C ORDERS OR NEEDS.  08/30/12 Haila Dena RN,BSN NCM [phone] AFIB,EKG QD.#4 CARDIOVERSION FRIDAY.

## 2012-08-30 NOTE — Progress Notes (Signed)
Cardioversion will be Friday, not in am due to schedule.  I discussed this with the patient. Rhythm AF with CVR and Qtc 485 by EKG today. Continue Sotalol.   Corine Shelter PA-C 08/30/2012 2:20 PM

## 2012-08-30 NOTE — Progress Notes (Addendum)
ANTICOAGULATION CONSULT NOTE - Follow Up Consult  Pharmacy Consult for Xarelto Indication: atrial fibrillation and probable VTE  Allergies  Allergen Reactions  . Morphine     HIVES    Patient Measurements: Height: 5\' 8"  (172.7 cm) Weight: 393 lb 4.8 oz (178.4 kg) IBW/kg (Calculated) : 68.4  Vital Signs: Temp: 97.8 F (36.6 C) (07/23 0452) Temp src: Oral (07/23 0452) BP: 118/71 mmHg (07/23 0452) Pulse Rate: 76 (07/23 0452)  Labs:  Recent Labs  08/27/12 1414  08/28/12 0434 08/29/12 0503 08/30/12 0450  HGB  --   < > 10.2* 10.8* 11.2*  HCT  --   --  33.5* 35.6* 36.9*  PLT  --   --  196 242 249  HEPARINUNFRC 0.59  --   --   --   --   CREATININE  --   --  0.97 0.99 0.93  < > = values in this interval not displayed.  Estimated Creatinine Clearance: 132.6 ml/min (by C-G formula based on Cr of 0.93).    Assessment: 62 yo morbidly obese male with new onset afib and probable VTE.  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  Heparin was initially started on 7/19 and patient was switched to Xarelto on 7/20 prior to 3 unsuccessful attempts at DCCV on 7/21 (after TEE with no LA clot).  Sotalol started 7/21 and repeat attempt at DCCV scheduled for Friday.  Xarelto 15 mg BID with meals ordered for possible VTE.  No evidence of PE on CT angio, unable to diagnose DVT from bilateral lower extremity doppler study due to large body habitus.  SCr WNL, CrCl>100 ml/min  CBC stable, no bleeding reported  Xarelto education completed by pharmacist  Goal of Therapy:  VTE treatment Monitor platelets by anticoagulation protocol: Yes   Plan:  Continue Xarelto 15 mg PO BID with meals for 21 days total then switch to 20 mg daily on 09/18/12.  Clance Boll 08/30/2012,11:00 AM

## 2012-08-30 NOTE — Progress Notes (Signed)
Report received from Carlyn Reichert RN, agree with am assessment.

## 2012-08-30 NOTE — Progress Notes (Signed)
Patient has been stable and asymptomatic, rate steady, with brief, asymptomatic drops to the 30s overnight.  Spoke with Boyce Medici, PA to ask if patient was ok to be off monitor from 9a-12p this morning while monitors are down. Patient ok to go unmonitored during this time; will monitor closely for symptoms fast/slow HR, chest pain, palpitations, etc.

## 2012-08-31 ENCOUNTER — Other Ambulatory Visit: Payer: Self-pay

## 2012-08-31 LAB — CBC
MCH: 28.2 pg (ref 26.0–34.0)
MCHC: 30.4 g/dL (ref 30.0–36.0)
MCV: 92.7 fL (ref 78.0–100.0)
Platelets: 221 10*3/uL (ref 150–400)
RBC: 3.86 MIL/uL — ABNORMAL LOW (ref 4.22–5.81)
RDW: 16.1 % — ABNORMAL HIGH (ref 11.5–15.5)

## 2012-08-31 LAB — BASIC METABOLIC PANEL
CO2: 29 mEq/L (ref 19–32)
Calcium: 9.8 mg/dL (ref 8.4–10.5)
Creatinine, Ser: 1.11 mg/dL (ref 0.50–1.35)

## 2012-08-31 MED ORDER — SOTALOL HCL 80 MG PO TABS
80.0000 mg | ORAL_TABLET | Freq: Two times a day (BID) | ORAL | Status: DC
  Administered 2012-08-31 – 2012-09-02 (×5): 80 mg via ORAL
  Filled 2012-08-31 (×6): qty 1

## 2012-08-31 NOTE — Progress Notes (Signed)
While patient was awake O2 saturation on RA (on cont pulse ox) was 95-98%. As patient started falling asleep O2 Sat decreased to 79-80% until patient woke up, then would increase back to 90s. Pt states that it sometimes feels like there is a 'pause' in his breathing when he is asleep/relaxing. 2L O2 applied to patient and O2 sats greater than 90 while sleeping.  Earnest Conroy. Clelia Croft, RN

## 2012-08-31 NOTE — Progress Notes (Addendum)
Pt desating to 78% on 4L O2 while asleep. O2 sats in 90s on RA when patient awake. MD notified, new orders that it is okay to leave off cont O2 so that patient can rest. Will re-evaluate on rounds.  Earnest Conroy. Clelia Croft, RN

## 2012-08-31 NOTE — Progress Notes (Signed)
[facility]  DAILY PROGRESS NOTE   Subjective:  Mild dyspnea walking to bathroom. No dizziness.  Objective:  Temp:  [97.9 F (36.6 C)-98.2 F (36.8 C)] 98 F (36.7 C) (07/24 1300) Pulse Rate:  [82-93] 87 (07/24 1300) Resp:  [15-16] 15 (07/24 1300) BP: (115-130)/(79-84) 125/79 mmHg (07/24 1300) SpO2:  [97 %-98 %] 98 % (07/24 1300) Weight:  [392 lb 10.2 oz (178.1 kg)] 392 lb 10.2 oz (178.1 kg) (07/24 0550) Weight change: -10.6 oz (-0.3 kg)  Intake/Output from previous day: 07/23 0701 - 07/24 0700 In: 480 [P.O.:480] Out: -   Intake/Output from this shift: Total I/O In: 240 [P.O.:240] Out: 750 [Urine:750]  Medications: Current Facility-Administered Medications  Medication Dose Route Frequency Provider Last Rate Last Dose  . acetaminophen (TYLENOL) tablet 650 mg  650 mg Oral Q6H PRN Vassie Loll, MD   650 mg at 08/27/12 1903   Or  . acetaminophen (TYLENOL) suppository 650 mg  650 mg Rectal Q6H PRN Vassie Loll, MD      . diltiazem (CARDIZEM CD) 24 hr capsule 240 mg  240 mg Oral Daily Wilburt Finlay, PA-C   240 mg at 08/31/12 1610  . HYDROmorphone (DILAUDID) injection 1 mg  1 mg Intravenous Q4H PRN Vassie Loll, MD   1 mg at 08/30/12 2203  . insulin aspart (novoLOG) injection 0-9 Units  0-9 Units Subcutaneous TID WC Vassie Loll, MD      . levothyroxine (SYNTHROID, LEVOTHROID) tablet 100 mcg  100 mcg Oral QAC breakfast Vassie Loll, MD   100 mcg at 08/31/12 0829  . multivitamin with minerals tablet 1 tablet  1 tablet Oral Daily Vassie Loll, MD   1 tablet at 08/31/12 [phone]  . pantoprazole (PROTONIX) EC tablet 40 mg  40 mg Oral Q1200 Vassie Loll, MD   40 mg at 08/31/12 5409  . [START ON 09/18/2012] Rivaroxaban (XARELTO) tablet 20 mg  20 mg Oral Q breakfast Gwen Her, RPH       Followed by  . Rivaroxaban (XARELTO) tablet 15 mg  15 mg Oral BID WC Gwen Her, RPH   15 mg at 08/31/12 1745  . sotalol (BETAPACE) tablet 80 mg  80 mg Oral  Q12H Wilburt Finlay, PA-C   80 mg at 08/31/12 1228    Physical Exam: General appearance: alert, cooperative and morbidly obese  Neck: no adenopathy, no carotid bruit, supple, symmetrical, trachea midline, thyroid not enlarged, symmetric, no tenderness/mass/nodules and Jugular venous pulsations are impossible to assess  Lungs: clear to auscultation bilaterally  Heart: irregularly irregular rhythm, S1, S2 normal and No murmurs, rubs or gallops  Abdomen: soft, non-tender; bowel sounds normal; no masses, no organomegaly and Obesity precludes a detailed examination  Extremities: extremities normal, atraumatic, no cyanosis or edema and Multiple scars surrounding left knee  Pulses: 2+ and symmetric  Skin: Skin color, texture, turgor normal. No rashes or lesions  Neurologic: Alert and oriented X 3, normal strength and tone. Normal symmetric reflexes. Normal coordination and gait   Lab Results: Results for orders placed during the hospital encounter of 08/26/12 (from the past 48 hour(s))  GLUCOSE, CAPILLARY     Status: Abnormal   Collection Time    08/29/12  9:18 PM      Result Value Range   Glucose-Capillary 114 (*) 70 - 99 mg/dL  CBC     Status: Abnormal   Collection Time    08/30/12  4:50 AM      Result Value Range   WBC  6.2  4.0 - 10.5 K/uL   RBC 3.92 (*) 4.22 - 5.81 MIL/uL   Hemoglobin 11.2 (*) 13.0 - 17.0 g/dL   HCT 16.1 (*) 09.6 - 04.5 %   MCV 94.1  78.0 - 100.0 fL   MCH 28.6  26.0 - 34.0 pg   MCHC 30.4  30.0 - 36.0 g/dL   RDW 40.9 (*) 81.1 - 91.4 %   Platelets 249  150 - 400 K/uL  COMPREHENSIVE METABOLIC PANEL     Status: Abnormal   Collection Time    08/30/12  4:50 AM      Result Value Range   Sodium 137  135 - 145 mEq/L   Potassium 4.3  3.5 - 5.1 mEq/L   Chloride 105  96 - 112 mEq/L   CO2 24  19 - 32 mEq/L   Glucose, Bld 112 (*) 70 - 99 mg/dL   BUN 17  6 - 23 mg/dL   Creatinine, Ser 7.82  0.50 - 1.35 mg/dL   Calcium 9.6  8.4 - 95.6 mg/dL   Total Protein 6.3  6.0 - 8.3  g/dL   Albumin 3.4 (*) 3.5 - 5.2 g/dL   AST 23  0 - 37 U/L   ALT 49  0 - 53 U/L   Alkaline Phosphatase 63  39 - 117 U/L   Total Bilirubin 0.6  0.3 - 1.2 mg/dL   GFR calc non Af Amer 89 (*) >90 mL/min   GFR calc Af Amer >90  >90 mL/min   Comment:            The eGFR has been calculated     using the CKD EPI equation.     This calculation has not been     validated in all clinical     situations.     eGFR's persistently     <90 mL/min signify     possible Chronic Kidney Disease.  MAGNESIUM     Status: None   Collection Time    08/30/12  4:50 AM      Result Value Range   Magnesium 2.2  1.5 - 2.5 mg/dL  GLUCOSE, CAPILLARY     Status: Abnormal   Collection Time    08/30/12  7:29 AM      Result Value Range   Glucose-Capillary 114 (*) 70 - 99 mg/dL  GLUCOSE, CAPILLARY     Status: None   Collection Time    08/30/12 11:34 AM      Result Value Range   Glucose-Capillary 99  70 - 99 mg/dL  GLUCOSE, CAPILLARY     Status: Abnormal   Collection Time    08/30/12  5:03 PM      Result Value Range   Glucose-Capillary 112 (*) 70 - 99 mg/dL  GLUCOSE, CAPILLARY     Status: Abnormal   Collection Time    08/30/12  8:59 PM      Result Value Range   Glucose-Capillary 114 (*) 70 - 99 mg/dL  CBC     Status: Abnormal   Collection Time    08/31/12  4:54 AM      Result Value Range   WBC 5.0  4.0 - 10.5 K/uL   RBC 3.86 (*) 4.22 - 5.81 MIL/uL   Hemoglobin 10.9 (*) 13.0 - 17.0 g/dL   HCT 21.3 (*) 08.6 - 57.8 %   MCV 92.7  78.0 - 100.0 fL   MCH 28.2  26.0 - 34.0 pg   MCHC 30.4  30.0 - 36.0 g/dL   RDW 16.1 (*) 09.6 - 04.5 %   Platelets 221  150 - 400 K/uL  BASIC METABOLIC PANEL     Status: Abnormal   Collection Time    08/31/12  4:54 AM      Result Value Range   Sodium 140  135 - 145 mEq/L   Potassium 3.8  3.5 - 5.1 mEq/L   Chloride 104  96 - 112 mEq/L   CO2 29  19 - 32 mEq/L   Glucose, Bld 101 (*) 70 - 99 mg/dL   BUN 17  6 - 23 mg/dL   Creatinine, Ser 4.09  0.50 - 1.35 mg/dL   Calcium  9.8  8.4 - 81.1 mg/dL   GFR calc non Af Amer 70 (*) >90 mL/min   GFR calc Af Amer 81 (*) >90 mL/min   Comment:            The eGFR has been calculated     using the CKD EPI equation.     This calculation has not been     validated in all clinical     situations.     eGFR's persistently     <90 mL/min signify     possible Chronic Kidney Disease.  GLUCOSE, CAPILLARY     Status: None   Collection Time    08/31/12  7:43 AM      Result Value Range   Glucose-Capillary 99  70 - 99 mg/dL  GLUCOSE, CAPILLARY     Status: None   Collection Time    08/31/12 12:06 PM      Result Value Range   Glucose-Capillary 99  70 - 99 mg/dL  GLUCOSE, CAPILLARY     Status: Abnormal   Collection Time    08/31/12  5:13 PM      Result Value Range   Glucose-Capillary 108 (*) 70 - 99 mg/dL    Imaging: No results found.  Assessment:  1. Principal Problem: 2.   Atrial fibrillation 3. Active Problems: 4.   DM 5.   Morbid obesity 6.   SOB (shortness of breath) 7.   Chest discomfort 8.   Plan:  1. Still symptomatic despite good rate control. Plan another attempt at cardioversion with HE defib, on max tolerated dose of sotalol, tomorrow at 11Am with Dr. Rennis Golden.  Time Spent Directly with Patient:  30 minutes  Length of Stay:  LOS: 5 days    Alcides Nutting 08/31/2012, 6:29 PM

## 2012-08-31 NOTE — Progress Notes (Signed)
Pt is not tolerating doses of sotalol greater than 80mg .  QTc was back up to .  Decreased dose to 80  Trust Leh 10:47 AM

## 2012-09-01 ENCOUNTER — Encounter (HOSPITAL_COMMUNITY)
Admission: EM | Disposition: A | Discharge status: Home / Self Care | Payer: Self-pay | Source: Home / Self Care | Attending: Cardiovascular Disease

## 2012-09-01 ENCOUNTER — Ambulatory Visit (HOSPITAL_COMMUNITY): Admission: RE | Admit: 2012-09-01 | Payer: Medicare Other | Source: Ambulatory Visit | Admitting: Internal Medicine

## 2012-09-01 ENCOUNTER — Encounter (HOSPITAL_COMMUNITY): Payer: Self-pay | Admitting: Anesthesiology

## 2012-09-01 ENCOUNTER — Encounter (HOSPITAL_COMMUNITY): Admission: RE | Payer: Self-pay | Source: Ambulatory Visit

## 2012-09-01 ENCOUNTER — Inpatient Hospital Stay (HOSPITAL_COMMUNITY): Payer: Medicare Other | Admitting: Anesthesiology

## 2012-09-01 HISTORY — PX: CARDIOVERSION: SHX1299

## 2012-09-01 LAB — CBC
HCT: 35.5 % — ABNORMAL LOW (ref 39.0–52.0)
MCV: 92.4 fL (ref 78.0–100.0)
RDW: 16.1 % — ABNORMAL HIGH (ref 11.5–15.5)
WBC: 5.6 10*3/uL (ref 4.0–10.5)

## 2012-09-01 LAB — GLUCOSE, CAPILLARY: Glucose-Capillary: 137 mg/dL — ABNORMAL HIGH (ref 70–99)

## 2012-09-01 SURGERY — CARDIOVERSION
Anesthesia: Monitor Anesthesia Care | Site: Chest | Wound class: Clean

## 2012-09-01 SURGERY — CARDIOVERSION
Anesthesia: Monitor Anesthesia Care

## 2012-09-01 SURGERY — CARDIOVERSION
Anesthesia: Monitor Anesthesia Care | Wound class: Clean

## 2012-09-01 MED ORDER — PROPOFOL 10 MG/ML IV BOLUS
INTRAVENOUS | Status: DC | PRN
  Administered 2012-09-01: 150 mg via INTRAVENOUS

## 2012-09-01 MED ORDER — SOTALOL HCL 80 MG PO TABS: 80.0000 mg | ORAL_TABLET | Freq: Two times a day (BID) | ORAL | Status: DC

## 2012-09-01 MED ORDER — LACTATED RINGERS IV SOLN
INTRAVENOUS | Status: DC | PRN
  Administered 2012-09-01: 11:00:00 via INTRAVENOUS

## 2012-09-01 MED ORDER — RIVAROXABAN 15 MG PO TABS: 15.0000 mg | ORAL_TABLET | Freq: Two times a day (BID) | ORAL | Status: DC

## 2012-09-01 MED ORDER — RIVAROXABAN 20 MG PO TABS: 20.0000 mg | ORAL_TABLET | Freq: Every day | ORAL | Status: DC

## 2012-09-01 MED ORDER — LIDOCAINE HCL (CARDIAC) 20 MG/ML IV SOLN
INTRAVENOUS | Status: DC | PRN
  Administered 2012-09-01: 50 mg via INTRAVENOUS

## 2012-09-01 MED ORDER — DILTIAZEM HCL ER COATED BEADS 240 MG PO CP24: 240.0000 mg | ORAL_CAPSULE | Freq: Every day | ORAL | Status: DC

## 2012-09-01 MED ORDER — PANTOPRAZOLE SODIUM 40 MG PO TBEC: 40.0000 mg | DELAYED_RELEASE_TABLET | Freq: Every day | ORAL | Status: DC

## 2012-09-01 NOTE — Discharge Summary (Signed)
Physician Discharge Summary      Patient ID: [name] MRN: [id number] DOB/AGE: 1950/10/23 62 y.o.  Admit date: 08/26/2012 Discharge date: 09/01/2012  Discharge Diagnoses:  Principal Problem:   Atrial fibrillation Active Problems:   DM   Morbid obesity   SOB (shortness of breath)   Chest discomfort   Discharged Condition: good  Procedures: 08/28/12 TEE by Dr. Royann Shivers 08/28/12 DCCV unsuccessful by Dr. Royann Shivers.. 09/01/12 DCCV with success to SR by Dr. Rennis Golden.  Hospital Course: This is a 62 y.o. male with a past medical history significant for paroxysmal atrial fibrillation at the time of surgery for septic knee arthritis in 2012, but otherwise without known cardiac problems. Workup for atypical chest pain in 2002 with coronary angiography showed no evidence of coronary artery stenoses. Echocardiography in 2010 shows normal left ventricular systolic function in the absence of any meaningful vital abnormalities. Left atrium is mildly dilated, with end-systolic diameter 39 mm.  He presented with roughly 6 days of increasing shortness of breath. He had not been aware of palpitations. On day of admit he was unable to participate in physical therapy due to shortness of breath. He went to see Dr. Mayford Knife in urgent care and was found to have atrial fibrillation rapid ventricular response and referred to the [facility] emergency room. The pt improved dramatically following initiation of rate control of atrial fib with intravenous diltiazem.  He denied any problems with chest pain at rest or with exertion, now or in the recent past. He has no history of dizziness or syncope. He has at most minimal ankle edema. He denies history of stroke TIA or other potential embolic events and has not had any bleeding problems. He has multiple symptoms suggestive of obstructive sleep apnea including loud snoring daytime somnolence and is morbidly obese.   CT angiogram performed in the emergency room shows no  evidence of pulmonary embolism but shows only artery dilatation consistent with pulmonary hypertension. There is also note made of coronary artery calcification which is especially prominent in the proximal LAD artery.   Pt was placed on IV Heparin as well.  Unfortunately pt remained in atrial fib and then was scheduled for TEE.  Additionally, lower ext venous dopplers were positive for possible DVT.  Pt's heparin was transitioned to Xarelto for 15 mg twice a day for 21 days then 20 mg daily.   His Cardizem was changed to po and Sotalol was added.  TEE did not show any clot and pt was cardioverted X 3 without success.  Plans were to allow for more doses of max Sotalol and retry the cardioversion.  Pt remained stable with mild SOB.  On July 25th pt was cardioverted by Dr. Delice Lesch 2, the second cardioversion was was successful.  Pt was stable and ready for discharge the afternoon of July 25th.  Pt was maintaining SR at discharge.  His diabetes is controlled with diet.  He will discuss need for sleep study as an outpatient.   Consults: cardiology   Significant Diagnostic Studies:  BMET    Component Value Date/Time   NA 140 08/31/2012 0454   K 3.8 08/31/2012 0454   CL 104 08/31/2012 0454   CO2 29 08/31/2012 0454   GLUCOSE 101* 08/31/2012 0454   BUN 17 08/31/2012 0454   CREATININE 1.11 08/31/2012 0454   CREATININE 1.43* 10/19/2011 1707   CALCIUM 9.8 08/31/2012 0454   GFRNONAA 70* 08/31/2012 0454   GFRAA 81* 08/31/2012 0454    CBC  Component Value Date/Time   WBC 5.6 09/01/2012 0403   RBC 3.84* 09/01/2012 0403   HGB 10.8* 09/01/2012 0403   HCT 35.5* 09/01/2012 0403   PLT 230 09/01/2012 0403   MCV 92.4 09/01/2012 0403   MCH 28.1 09/01/2012 0403   MCHC 30.4 09/01/2012 0403   RDW 16.1* 09/01/2012 0403   LYMPHSABS 1.6 10/19/2011 1707   MONOABS 0.5 10/19/2011 1707   EOSABS 0.1 10/19/2011 1707   BASOSABS 0.0 10/19/2011 1707   Troponin <0.30 X 3,   TSH 4.147,  Free T4 1.48    Hgb A1C 5.3    Discharge  Exam: Blood pressure 110/77, pulse 77, temperature 97.4 F (36.3 C), temperature source Oral, resp. rate 9, height 5\' 8"  (1.727 m), weight 380 lb 8.2 oz (172.6 kg), SpO2 99.00%.    Disposition: 01-Home or Self Care   Future Appointments Provider Department Dept Phone   09/06/2012 4:00 PM Randall Hiss, MD [facility] [phone]   09/08/2012 2:00 PM Nada Boozer, NP [facility] Ginette Otto [phone]       Medication List    STOP taking these medications       aspirin EC 81 MG tablet     ibuprofen 200 MG tablet  Commonly known as:  ADVIL,MOTRIN     Saw Palmetto (Serenoa repens) 320 MG Caps      TAKE these medications       diltiazem 240 MG 24 hr capsule  Commonly known as:  CARDIZEM CD  Take 1 capsule (240 mg total) by mouth daily.     levothyroxine 100 MCG tablet  Commonly known as:  SYNTHROID, LEVOTHROID  Take 100 mcg by mouth daily before breakfast.     multivitamin with minerals Tabs  Take 1 tablet by mouth daily.     pantoprazole 40 MG tablet  Commonly known as:  PROTONIX  Take 1 tablet (40 mg total) by mouth daily at 12 noon.     Rivaroxaban 15 MG Tabs tablet  Commonly known as:  XARELTO  Take 1 tablet (15 mg total) by mouth 2 (two) times daily with a meal.     Rivaroxaban 20 MG Tabs  Commonly known as:  XARELTO  Take 1 tablet (20 mg total) by mouth daily with breakfast.  Start taking on:  09/18/2012     sotalol 80 MG tablet  Commonly known as:  BETAPACE  Take 1 tablet (80 mg total) by mouth every 12 (twelve) hours.     VITAMIN B-6 PO  Take 1 tablet by mouth daily.           Follow-up Information   Follow up with [facility], NP On 09/08/2012. (at 2:00 pm Dr. Erin Hearing Nurse Pracitioner.)    Contact information:   [address] [place] [address] [phone]      Discharge Instructions:  You will take the Xarelto 15 mg twice a day for a total of 21  days, last dose 09/17/12,  New dose of 20 mg daily begins 09/18/12.  Heart Healthy Diabetic diet.    Call if any bleeding.   Signed: Leone Brand Nurse Practitioner-Certified [facility] 09/01/2012, 12:52 PM  Time spent on discharge :40 minutes.

## 2012-09-01 NOTE — Progress Notes (Signed)
Report given to carelink and patient transported to [facility] cath lab.

## 2012-09-01 NOTE — Progress Notes (Signed)
Pt with freq PVCs.including bigemny.  Will keep tonight an recheck Qtc in am.

## 2012-09-01 NOTE — Progress Notes (Signed)
Post-cardioversion EKG shows sinus rhythm with QTC of ~500 msec. Should be able to tolerate sotalol 80 mg BID. Would not increase dose further and avoid other QT prolonging agents.  Will need repeat 12-lead EKG to assess QTc on follow-up in the office next week.  Chrystie Nose, MD, [facility] Attending Cardiologist [facility]

## 2012-09-01 NOTE — Anesthesia Preprocedure Evaluation (Signed)
Anesthesia Evaluation  Patient identified by MRN, date of birth, ID band Patient awake    Reviewed: Allergy & Precautions, H&P , NPO status , Patient's Chart, lab work & pertinent test results, reviewed documented beta blocker date and time   History of Anesthesia Complications (+) PROLONGED EMERGENCE  Airway Mallampati: II TM Distance: >3 FB Neck ROM: full    Dental   Pulmonary shortness of breath,  breath sounds clear to auscultation        Cardiovascular negative cardio ROS  + dysrhythmias Atrial Fibrillation Rhythm:regular     Neuro/Psych negative neurological ROS  negative psych ROS   GI/Hepatic negative GI ROS, Neg liver ROS,   Endo/Other  diabetes, Insulin Dependent and Oral Hypoglycemic AgentsHypothyroidism Morbid obesity  Renal/GU Renal InsufficiencyRenal disease  negative genitourinary   Musculoskeletal   Abdominal   Peds  Hematology  (+) anemia ,   Anesthesia Other Findings See surgeon's H&P   Reproductive/Obstetrics negative OB ROS                           Anesthesia Physical Anesthesia Plan  ASA: III  Anesthesia Plan: General   Post-op Pain Management:    Induction: Intravenous  Airway Management Planned: Mask  Additional Equipment:   Intra-op Plan:   Post-operative Plan:   Informed Consent: I have reviewed the patients History and Physical, chart, labs and discussed the procedure including the risks, benefits and alternatives for the proposed anesthesia with the patient or authorized representative who has indicated his/her understanding and acceptance.   Dental Advisory Given  Plan Discussed with: CRNA and Surgeon  Anesthesia Plan Comments:         Anesthesia Quick Evaluation

## 2012-09-01 NOTE — H&P (Signed)
   INTERVAL PROCEDURE H&P  History and Physical Interval Note:  09/01/2012 10:01 AM  [name] has presented today for their planned procedure. The various methods of treatment have been discussed with the patient and family. After consideration of risks, benefits and other options for treatment, the patient has consented to the procedure.  The patients' outpatient history has been reviewed, patient examined, and no change in status from most recent office note within the past 30 days. I have reviewed the patients' chart and labs and will proceed as planned. Questions were answered to the patient's satisfaction.   Chrystie Nose, MD, [facility] Attending Cardiologist [facility]  Soniya Ashraf C 09/01/2012, 10:01 AM

## 2012-09-01 NOTE — CV Procedure (Signed)
  CARDIOVERSION NOTE  Procedure: Electrical Cardioversion Indications:  Atrial Fibrillation  Procedure Details:  Consent: Risks of procedure as well as the alternatives and risks of each were explained to the (patient/caregiver).  Consent for procedure obtained.  Due to the patient's body habitus, he was considered higher risk for airway complication and the procedure was performed in OR 8 with anesthesia present. A high energy LifePak defibrillator was used due to suspected high thoracic impedence.   Time Out: Verified patient identification, verified procedure, site/side was marked, verified correct patient position, special equipment/implants available, medications/allergies/relevent history reviewed, required imaging and test results available.  Performed  Patient placed on cardiac monitor, pulse oximetry, supplemental oxygen as necessary.  Sedation given: Propofol per anesthesia Pacer pads placed anterior and posterior chest.  Cardioverted 2 time(s). The first cardioversion converted to sinus, but the rhythm degenerated back to a-fib. The patient was aggressively oxygenated and a second cardioversion was performed which was successful. Cardioverted at 360J biphasic.  Impression: Findings: Post procedure EKG shows: NSR Complications: None Patient did tolerate procedure well.  Plan:  1. Will obtain post-procedural EKG in the PACU 2. Return to [facility]. Continue sotalol 80 mg BID.  3. Likely discharge home this afternoon with early follow-up next week in our office. 4. Continue xarelto for DVT treatment dose, with transition to maintenance 20 mg daily dose for a-fib and stroke prevention.  Time Spent Directly with the Patient:  45 minutes   Chrystie Nose, MD, [facility] Attending Cardiologist [facility]  HILTY,Kenneth C 09/01/2012, 11:38 AM

## 2012-09-01 NOTE — Transfer of Care (Signed)
Immediate Anesthesia Transfer of Care Note  Patient: [name]  Procedure(s) Performed: Procedure(s): CARDIOVERSION (N/A)  Patient Location: PACU  Anesthesia Type:General  Level of Consciousness: awake, alert  and oriented  Airway & Oxygen Therapy: Patient Spontanous Breathing and Patient connected to nasal cannula oxygen  Post-op Assessment: Report given to PACU RN, Post -op Vital signs reviewed and stable and Patient moving all extremities  Post vital signs: Reviewed and stable  Complications: No apparent anesthesia complications

## 2012-09-01 NOTE — Anesthesia Postprocedure Evaluation (Signed)
Anesthesia Post Note  Patient: [name]  Procedure(s) Performed: Procedure(s) (LRB): CARDIOVERSION (N/A)  Anesthesia type: General  Patient location: PACU  Post pain: Pain level controlled  Post assessment: Patient's Cardiovascular Status Stable  Last Vitals:  Filed Vitals:   09/01/12 1145  BP: 104/86  Pulse: 79  Temp: 36.3 C  Resp: 15    Post vital signs: Reviewed and stable  Level of consciousness: alert  Complications: No apparent anesthesia complications

## 2012-09-02 LAB — CBC
MCHC: 31 g/dL (ref 30.0–36.0)
Platelets: 240 10*3/uL (ref 150–400)
RDW: 15.9 % — ABNORMAL HIGH (ref 11.5–15.5)
WBC: 5.4 10*3/uL (ref 4.0–10.5)

## 2012-09-02 LAB — BASIC METABOLIC PANEL
BUN: 15 mg/dL (ref 6–23)
Chloride: 102 mEq/L (ref 96–112)
Creatinine, Ser: 0.99 mg/dL (ref 0.50–1.35)
GFR calc Af Amer: >90 mL/min (ref >90)
GFR calc non Af Amer: 87 mL/min — ABNORMAL LOW (ref >90)
Potassium: 4.1 mEq/L (ref 3.5–5.1)

## 2012-09-02 LAB — GLUCOSE, CAPILLARY: Glucose-Capillary: 100 mg/dL — ABNORMAL HIGH (ref 70–99)

## 2012-09-02 LAB — MAGNESIUM: Magnesium: 2.1 mg/dL (ref 1.5–2.5)

## 2012-09-02 NOTE — Progress Notes (Signed)
Patient discharge to home, d/c instructions and follow up appointment discussed with patient. PIV removed no s/s of infiltration or swelling noted.

## 2012-09-02 NOTE — Progress Notes (Signed)
DAILY PROGRESS NOTE  Subjective:  No events overnight. Feels fine. QTc stable at 502. Remains in sinus with infrequent PVC's.  Objective:  Temp:  [97.9 F (36.6 C)-98.2 F (36.8 C)] 97.9 F (36.6 C) (07/26 0538) Pulse Rate:  [79-85] 84 (07/26 0538) Resp:  [14-20] 20 (07/26 0538) BP: (115-137)/(63-75) 118/64 mmHg (07/26 0538) SpO2:  [97 %-100 %] 99 % (07/26 0538) Weight:  [372 lb 14.4 oz (169.146 kg)] 372 lb 14.4 oz (169.146 kg) (07/26 0538) Weight change: -7 lb 9.8 oz (-3.454 kg)  Intake/Output from previous day: 07/25 0701 - 07/26 0700 In: 830 [P.O.:730; I.V.:100] Out: -   Intake/Output from this shift: Total I/O In: 480 [P.O.:480] Out: -   Medications: Current Facility-Administered Medications  Medication Dose Route Frequency Provider Last Rate Last Dose  . acetaminophen (TYLENOL) tablet 650 mg  650 mg Oral Q6H PRN Vassie Loll, MD   650 mg at 08/27/12 1903   Or  . acetaminophen (TYLENOL) suppository 650 mg  650 mg Rectal Q6H PRN Vassie Loll, MD      . diltiazem (CARDIZEM CD) 24 hr capsule 240 mg  240 mg Oral Daily Wilburt Finlay, PA-C   240 mg at 09/02/12 0926  . HYDROmorphone (DILAUDID) injection 1 mg  1 mg Intravenous Q4H PRN Vassie Loll, MD   1 mg at 08/30/12 2203  . insulin aspart (novoLOG) injection 0-9 Units  0-9 Units Subcutaneous TID WC Vassie Loll, MD   1 Units at 09/01/12 1755  . levothyroxine (SYNTHROID, LEVOTHROID) tablet 100 mcg  100 mcg Oral QAC breakfast Vassie Loll, MD   100 mcg at 09/02/12 0926  . multivitamin with minerals tablet 1 tablet  1 tablet Oral Daily Vassie Loll, MD   1 tablet at 09/02/12 [phone]  . pantoprazole (PROTONIX) EC tablet 40 mg  40 mg Oral Q1200 Vassie Loll, MD   40 mg at 09/02/12 1217  . [START ON 09/18/2012] Rivaroxaban (XARELTO) tablet 20 mg  20 mg Oral Q breakfast Gwen Her, RPH       Followed by  . Rivaroxaban (XARELTO) tablet 15 mg  15 mg Oral BID WC Gwen Her, RPH   15 mg at 09/02/12 9604  . sotalol  (BETAPACE) tablet 80 mg  80 mg Oral Q12H Wilburt Finlay, PA-C   80 mg at 09/02/12 5409    Physical Exam: General appearance: alert and no distress Neck: no adenopathy, no carotid bruit, no JVD, supple, symmetrical, trachea midline and thyroid not enlarged, symmetric, no tenderness/mass/nodules Lungs: clear to auscultation bilaterally Heart: regular rate and rhythm, S1, S2 normal, no murmur, click, rub or gallop Abdomen: soft, non-tender; bowel sounds normal; no masses,  no organomegaly Extremities: extremities normal, atraumatic, no cyanosis or edema Pulses: 2+ and symmetric Skin: Skin color, texture, turgor normal. No rashes or lesions Neurologic: Grossly normal  Lab Results: Results for orders placed during the hospital encounter of 08/26/12 (from the past 48 hour(s))  GLUCOSE, CAPILLARY     Status: Abnormal   Collection Time    08/31/12  5:13 PM      Result Value Range   Glucose-Capillary 108 (*) 70 - 99 mg/dL  GLUCOSE, CAPILLARY     Status: None   Collection Time    08/31/12  9:41 PM      Result Value Range   Glucose-Capillary 99  70 - 99 mg/dL  CBC     Status: Abnormal   Collection Time    09/01/12  4:03 AM      Result Value  Range   WBC 5.6  4.0 - 10.5 K/uL   RBC 3.84 (*) 4.22 - 5.81 MIL/uL   Hemoglobin 10.8 (*) 13.0 - 17.0 g/dL   HCT 16.1 (*) 09.6 - 04.5 %   MCV 92.4  78.0 - 100.0 fL   MCH 28.1  26.0 - 34.0 pg   MCHC 30.4  30.0 - 36.0 g/dL   RDW 40.9 (*) 81.1 - 91.4 %   Platelets 230  150 - 400 K/uL  GLUCOSE, CAPILLARY     Status: Abnormal   Collection Time    09/01/12  2:02 PM      Result Value Range   Glucose-Capillary 101 (*) 70 - 99 mg/dL  GLUCOSE, CAPILLARY     Status: Abnormal   Collection Time    09/01/12  4:43 PM      Result Value Range   Glucose-Capillary 137 (*) 70 - 99 mg/dL  GLUCOSE, CAPILLARY     Status: None   Collection Time    09/01/12  8:54 PM      Result Value Range   Glucose-Capillary 92  70 - 99 mg/dL  CBC     Status: Abnormal   Collection  Time    09/02/12  4:30 AM      Result Value Range   WBC 5.4  4.0 - 10.5 K/uL   RBC 4.09 (*) 4.22 - 5.81 MIL/uL   Hemoglobin 11.7 (*) 13.0 - 17.0 g/dL   HCT 78.2 (*) 95.6 - 21.3 %   MCV 92.2  78.0 - 100.0 fL   MCH 28.6  26.0 - 34.0 pg   MCHC 31.0  30.0 - 36.0 g/dL   RDW 08.6 (*) 57.8 - 46.9 %   Platelets 240  150 - 400 K/uL  BASIC METABOLIC PANEL     Status: Abnormal   Collection Time    09/02/12  4:30 AM      Result Value Range   Sodium 138  135 - 145 mEq/L   Potassium 4.1  3.5 - 5.1 mEq/L   Chloride 102  96 - 112 mEq/L   CO2 28  19 - 32 mEq/L   Glucose, Bld 93  70 - 99 mg/dL   BUN 15  6 - 23 mg/dL   Creatinine, Ser 6.29  0.50 - 1.35 mg/dL   Calcium 52.8  8.4 - 41.3 mg/dL   GFR calc non Af Amer 87 (*) >90 mL/min   GFR calc Af Amer >90  >90 mL/min   Comment:            The eGFR has been calculated     using the CKD EPI equation.     This calculation has not been     validated in all clinical     situations.     eGFR's persistently     <90 mL/min signify     possible Chronic Kidney Disease.  MAGNESIUM     Status: None   Collection Time    09/02/12  4:30 AM      Result Value Range   Magnesium 2.1  1.5 - 2.5 mg/dL  GLUCOSE, CAPILLARY     Status: Abnormal   Collection Time    09/02/12  7:45 AM      Result Value Range   Glucose-Capillary 100 (*) 70 - 99 mg/dL   Comment 1 Notify RN     Comment 2 Documented in Chart    GLUCOSE, CAPILLARY     Status: None   Collection Time  09/02/12 12:15 PM      Result Value Range   Glucose-Capillary 96  70 - 99 mg/dL    Imaging: No results found.  Assessment:  1. Principal Problem: 2.   Atrial fibrillation 3. Active Problems: 4.   DM 5.   Morbid obesity 6.   SOB (shortness of breath) 7.   Chest discomfort 8.   Plan:  1. Ok for discharge home today. Follow-up with Dr. Royann Shivers in 1-2 weeks or MLP if he is not available.  Time Spent Directly with Patient:  15 minutes  Length of Stay:  LOS: 7 days   Chrystie Nose, MD, [facility] Attending Cardiologist [facility]  HILTY,Kenneth C 09/02/2012, 1:08 PM

## 2012-09-04 ENCOUNTER — Telehealth: Payer: Self-pay | Admitting: Cardiology

## 2012-09-04 NOTE — Telephone Encounter (Signed)
Mr. Flink was given an RX by Nada Boozer, NP for Xarelto---is there anything that is cheaper?  He cannot afford this.

## 2012-09-04 NOTE — Telephone Encounter (Signed)
Discussed w/ L. Annie Paras, NP.  Advised pt come in today or tomorrow to get samples and keep appt on Friday.  Will need PA for Xarelto.  Returned call and spoke w/ pt.  Stated he was able to purchase 21-day supply by putting it on his credit card, but it was $300.  Pt stated he would like to get something different since his insurance isn't going to pay for it.  Pt informed a PA will need to be completed to request his insurance company pays for it and if denied an alternative will be given.  Pt verbalized understanding and agreed w/ plan.  Pt will keep his appt on Friday.  RN did apologize for pt having to spend $300 on med.

## 2012-09-05 ENCOUNTER — Encounter (HOSPITAL_COMMUNITY): Payer: Self-pay | Admitting: Internal Medicine

## 2012-09-06 ENCOUNTER — Ambulatory Visit (INDEPENDENT_AMBULATORY_CARE_PROVIDER_SITE_OTHER): Payer: Worker's Compensation | Admitting: Infectious Disease

## 2012-09-06 ENCOUNTER — Encounter: Payer: Self-pay | Admitting: Infectious Disease

## 2012-09-06 VITALS — BP 126/89 | HR 125 | Temp 98.1°F | Wt 372.0 lb

## 2012-09-06 DIAGNOSIS — T8459XS Infection and inflammatory reaction due to other internal joint prosthesis, sequela: Secondary | ICD-10-CM

## 2012-09-06 DIAGNOSIS — B951 Streptococcus, group B, as the cause of diseases classified elsewhere: Secondary | ICD-10-CM

## 2012-09-06 DIAGNOSIS — A491 Streptococcal infection, unspecified site: Secondary | ICD-10-CM

## 2012-09-06 DIAGNOSIS — T889XXS Complication of surgical and medical care, unspecified, sequela: Secondary | ICD-10-CM

## 2012-09-06 NOTE — Progress Notes (Signed)
Subjective:    Patient ID: [name], male    DOB: [birth year], 62 y.o.   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 Larinda Buttery  by Dr. Cathie Olden in 2008. Patient had been treated for prosthetic joint infection in 2007 with 6 weeks of IV antibiotics with removal of antibiotic spacer and reimplantation of prosthesis.   He was being observed off antibiotics when this April 2010 when he was admited in septic shock due once again to group C streptococci from his knee. He was  sp i and d and removal of prosthetic material. He was continued on parenteral antibiotics througout most of the month of April 2010 and then discharged on an additional 21 d of rocephin 1 g daily   I put him back on oral antibiotics PCN and then amoxicillin when I first saw him because I was concerned by persistence of erythema in the leg and warmth over the joint space. He also still had slightly elevated inflammatory markers.  After I saw him in September of 2010 he actually came off of abx on his own, and I again saw him in August of 2011 and have been following him OFF  antibiotics since.  We had folllowed him closely OFF abx with serial ESR and CRP which were encouraging and he has now in May 2014 undergone TKA with Dr Charlann Boxer.   He feels that his knee pain is the best IT has ever been! He says it is minimal and only with weight bearing. He denies fevers, malaise and chills.       Assessment & Plan:  Septic arthritis of knee  The patient has done well off antibiotics we'll recheck inflammatory markers.  And encouraged him to visit with Dr. Charlann Boxer but again I cannot guarantee that he will not have recurrence of infection. Again the culprit organism appears to been a group C streptococcus.  STREPTOCOCCUS INFECTION CCE & UNS SITE GROUP C See above    Dr. Linna Darner  Review of Systems  Constitutional: Negative for fever, chills,  diaphoresis, activity change, appetite change, fatigue and unexpected weight change.  HENT: Negative for congestion, sore throat, rhinorrhea, sneezing, trouble swallowing and sinus pressure.   Eyes: Negative for photophobia and visual disturbance.  Respiratory: Negative for cough, chest tightness, shortness of breath, wheezing and stridor.   Cardiovascular: Negative for chest pain, palpitations and leg swelling.  Gastrointestinal: Negative for nausea, vomiting, abdominal pain, diarrhea, constipation, blood in stool, abdominal distention and anal bleeding.  Genitourinary: Negative for dysuria, hematuria, flank pain and difficulty urinating.  Musculoskeletal: Negative for myalgias, back pain, joint swelling, arthralgias and gait problem.  Skin: Negative for color change, pallor, rash and wound.  Neurological: Negative for dizziness, tremors, weakness and light-headedness.  Hematological: Negative for adenopathy. Does not bruise/bleed easily.  Psychiatric/Behavioral: Negative for behavioral problems, confusion, sleep disturbance, dysphoric mood, decreased concentration and agitation.       Objective:   Physical Exam  Constitutional: He is oriented to person, place, and time. He appears well-developed and well-nourished. No distress.  HENT:  Head: Normocephalic and atraumatic.  Mouth/Throat: Oropharynx is clear and moist. No oropharyngeal exudate.  Eyes: Conjunctivae and EOM are normal. Pupils are equal, round, and reactive to light. No scleral icterus.  Neck: Normal range of motion. Neck supple. No JVD present.  Cardiovascular: Normal rate, regular rhythm and normal heart sounds.  Exam reveals no gallop and no friction rub.  No murmur heard. Pulmonary/Chest: Effort normal and breath sounds normal. No respiratory distress. He has no wheezes. He has no rales. He exhibits no tenderness.  Abdominal: He exhibits no distension and no mass. There is no tenderness. There is no rebound and no  guarding.  Musculoskeletal: He exhibits no edema and no tenderness.       Left knee: He exhibits no swelling and no effusion. No tenderness found.       Legs: Lymphadenopathy:    He has no cervical adenopathy.  Neurological: He is alert and oriented to person, place, and time. He has normal reflexes. He exhibits normal muscle tone. Coordination normal.  Skin: Skin is warm and dry. He is not diaphoretic. No erythema. No pallor.  Psychiatric: He has a normal mood and affect. His behavior is normal. Judgment and thought content normal.          Assessment & Plan:  Septic arthritis of knee  The patient has done well off antibiotics  > 3.5 YEARSs. And now is sp reimplantation of new TKA that does NOT appear clinicaly infected. I will NOT get ESR, CRP today but continue to observe clinically and have him RTC in 6 months  STREPTOCOCCUS INFECTION CCE & UNS SITE GROUP C  Was culprit organism

## 2012-09-08 ENCOUNTER — Ambulatory Visit (INDEPENDENT_AMBULATORY_CARE_PROVIDER_SITE_OTHER): Payer: Medicare Other | Admitting: Cardiology

## 2012-09-08 ENCOUNTER — Encounter: Payer: Self-pay | Admitting: Cardiology

## 2012-09-08 VITALS — BP 104/70 | HR 105 | Ht 68.0 in | Wt 373.6 lb

## 2012-09-08 DIAGNOSIS — E119 Type 2 diabetes mellitus without complications: Secondary | ICD-10-CM

## 2012-09-08 DIAGNOSIS — Z7901 Long term (current) use of anticoagulants: Secondary | ICD-10-CM

## 2012-09-08 DIAGNOSIS — I4891 Unspecified atrial fibrillation: Secondary | ICD-10-CM

## 2012-09-08 DIAGNOSIS — A4902 Methicillin resistant Staphylococcus aureus infection, unspecified site: Secondary | ICD-10-CM

## 2012-09-08 DIAGNOSIS — R0683 Snoring: Secondary | ICD-10-CM

## 2012-09-08 DIAGNOSIS — R0609 Other forms of dyspnea: Secondary | ICD-10-CM

## 2012-09-08 MED ORDER — DIGOXIN 125 MCG PO TABS: 0.1250 mg | ORAL_TABLET | Freq: Every day | ORAL | Status: DC

## 2012-09-08 NOTE — Progress Notes (Signed)
09/10/2012   PCP: Aura Dials, MD   Chief Complaint  Patient presents with  . Follow-up    post hospital:  No CP,SOB,dizziness.  Mild edema    Primary Cardiologist: Dr. Royann Shivers  HPI:  62 y.o. male with a past medical history significant for paroxysmal atrial fibrillation at the time of surgery for septic knee arthritis in 2012, but otherwise without known cardiac problems. Workup for atypical chest pain in 2002 with coronary angiography showed no evidence of coronary artery stenoses. Echocardiography in 2010 shows normal left ventricular systolic function in the absence of any meaningful vital abnormalities. Left atrium is mildly dilated, with end-systolic diameter 39 mm.   Patient had been admitted to [facility] hospital 08/26/2012 secondary to shortness of breath, a 6 day history of increasing shortness of breath. He was found to be in atrial fibrillation with RVR rate of 140. Patient was placed on IV Cardizem as well as IV Lasix was given. Patient was not aware of any tachycardia prior to admission.  He did have a CT in general in the emergency room there was no pulmonary embolus he did have artery dilatation consistent with pulmonary hypertension. Was also coronary artery calcification prominent in the proximal LAD artery. Patient was also started on IV heparin. He underwent TEE there was no clot he had cardioversion which unfortunately was unsuccessful. Please note patient was positive for possible DVT.   Patient was placed on sotalol to heparin was stopped patient was also placed on sotalol. July 5 patient was again cardioverted by Dr. Rennis Golden the second cardioversion that day was successful. He maintained sinus rhythm at discharge.  It was felt he would need a sleep study as an outpatient for his obesity and his diabetes is controlled with diet.  Issue presents today for followup of hospitalization and is back in atrial fibrillation with the rate is controlled currently at 105  beats per minute improved from the 140 beats per minute he had on admission.  He denies chest pain or shortness of breath.  His Xarelto was cleared by his insurance company as well, he head had to pay $300 to get the medication for 15 days prior to the clearance.  In-hospital echo on 08/28/2012 revealed EF 45-50% with diffuse hypokinesis no vegetation on aortic mitral or tricuspid valves. The left atrium is severely dilated but no evidence of thrombus in the atrial cavity or appendage.  He did have a small patent foramen ovale Doppler revealed a very small bidirectional but predominantly left-to-right atrial level shunt in the baseline state, he did have moderate tricuspid regurgitation, PA peak pressure 38 mmHg.  Allergies  Allergen Reactions  . Morphine     HIVES    Current Outpatient Prescriptions  Medication Sig Dispense Refill  . diltiazem (CARDIZEM CD) 240 MG 24 hr capsule Take 1 capsule (240 mg total) by mouth daily.  30 capsule  11  . levothyroxine (SYNTHROID, LEVOTHROID) 100 MCG tablet Take 100 mcg by mouth daily before breakfast.      . Multiple Vitamin (MULTIVITAMIN WITH MINERALS) TABS Take 1 tablet by mouth daily.      . pantoprazole (PROTONIX) 40 MG tablet Take 1 tablet (40 mg total) by mouth daily at 12 noon.  30 tablet  3  . Pyridoxine HCl (VITAMIN B-6 PO) Take 1 tablet by mouth daily.      . Rivaroxaban (XARELTO) 15 MG TABS tablet Take 1 tablet (15 mg total) by mouth 2 (two) times daily  with a meal.  42 tablet  0  . sotalol (BETAPACE) 80 MG tablet Take 1 tablet (80 mg total) by mouth every 12 (twelve) hours.  60 tablet  6  . digoxin (LANOXIN) 0.125 MG tablet Take 1 tablet (0.125 mg total) by mouth daily.  30 tablet  3   No current facility-administered medications for this visit.    Past Medical History  Diagnosis Date  . Complication of anesthesia     PT STATES HARD TO WAKE UP AFTER ONE SUGERY -STATES THE SURGERY TOOK LONGER THAN EXPECTED.  NO PROBLEMS WITH ANY OTHER  SURGERY  . Hypothyroidism   . Shortness of breath     WITH EXERTION AND PAIN  . Septic arthritis of knee     LEFT KNEE  . Pain     BACK PAIN - PT ATTRIBUTES TO THE WAY HE WALKS DUE TO LEFT KNEE PROBLEM  . Paroxysmal atrial fibrillation   . Chronic anticoagulation     wih Xarelto    Past Surgical History  Procedure Laterality Date  . Right knee arthroscopy  1998  . Left knee arthroscopy  1999  . Left knee surgery upper tibial ostomy    . Replacement left knee  2002  . Replacement right knee  2003  . Revision left knee cap   2004  . Left total knee removal for infection  2006  . Reimplantation left total knee   2006  . Removal left total knee   2008  . Reimplantation left total knee  2008  . 2010 removal of left total knee    . Hydrocelectoy   2012  . Reimplantation of total knee Left 06/23/2012    Procedure: REIMPLANTATION OF LEFT TOTAL KNEE;  Surgeon: Shelda Pal, MD;  Location: [facility] ORS;  Service: Orthopedics;  Laterality: Left;  . Tee without cardioversion N/A 08/28/2012    Procedure: TRANSESOPHAGEAL ECHOCARDIOGRAM (TEE);  Surgeon: Thurmon Fair, MD;  Location: [facility] OR;  Service: Cardiovascular;  Laterality: N/A;  . Cardioversion N/A 08/28/2012    Procedure: CARDIOVERSION;  Surgeon: Thurmon Fair, MD;  Location: [facility] OR;  Service: Cardiovascular;  Laterality: N/A;  . Cardioversion N/A 09/01/2012    Procedure: CARDIOVERSION;  Surgeon: Chrystie Nose, MD;  Location: [facility] OR;  Service: Cardiovascular;  Laterality: N/A;    ZOX:[id number]:UJ colds or fevers, no weight changes Skin:no rashes or ulcers HEENT:no blurred vision, no congestion CV:see HPI PUL:see HPI GI:no diarrhea constipation or melena, no indigestion GU:no hematuria, no dysuria MS:no joint pain, no claudication Neuro:no syncope, no lightheadedness Endo:+ diabetes, + thyroid disease  PHYSICAL EXAM BP 104/70  Pulse 105  Ht 5\' 8"  (1.727 m)  Wt 373 lb 9.6 oz (169.464 kg)  BMI 56.82 kg/m2 General:Pleasant affect,  NAD, obese Skin:Warm and dry, brisk capillary refill HEENT:normocephalic, sclera clear, mucus membranes moist Neck:supple, no JVD, no bruits  Heart:irreg irreg without murmur, gallup, rub or click Lungs:clear without rales, rhonchi, or wheezes WJX:BJYN, non tender, + BS, do not palpate liver spleen or masses Ext:no lower ext edema, 2+ pedal pulses, 2+ radial pulses Neuro:alert and oriented, MAE, follows commands, + facial symmetry  EKG: Atrial fibrillation with rapid ventricular response of 105 beats per minute occasional aberrancy  versus PVC  ASSESSMENT AND PLAN PAF (paroxysmal atrial fibrillation) Patient back for followup after recent hospitalization which included atrial fibrillation with rapid ventricular response. Initial cardioversion was unsuccessful sotalol was added to medical regimen and he then underwent repeat reversion which was successful. At discharge he was in  sinus rhythm. At one point he had a prolonged QTC but that was stable at discharge. Today he is back in atrial fibrillation rate is 105 which is better controlled than previously 140, secondary to current medications.  I discussed this with Dr. Royann Shivers and medication Lanoxin was added for better rate control.  Previously we do not know how long Mr. Lybrand was in atrial fib before he developed heart failure. Currently he has no CHF and is quite stable. Was not aware he was in atrial fibrillation. He will see Dr. Royann Shivers back in 2 weeks.  In the meantime we will schedule an appointment with Dr. Johney Frame,  EP, for possible ablation. Additionally patient will need a sleep study.  Systolic heart failure, recent history of secondary to atrial fibrillation. EF by echo 45-50% questionable if LV dysfunction related to tachycardia Patient does have LV dysfunction at 45-50% by recent echo.  It is unknown if this is secondary to tachycardia as patient had been in atrial fibrillation with RVR for unknown time prior to admission.  He was  treated with diuretics in the hospital and is now without heart failure.  Chronic anticoagulation, with Xarelto Xarelto was started this hospitalization and has been approved by his insurance company.  METHICILLIN RESISTANT STAPHYLOCOCCUS AUREUS INFECTION Followed by infectious disease and orthopedic.  DVT (deep venous thrombosis), possible Doppler studies revealed possible DVT in the hospital. He is on Zarontin no initially at 50 mg twice a day until August 11 when he will begin 20 mg daily with breakfast.  DM Controlled by diet    Lanoxin 0.125 mg added.

## 2012-09-08 NOTE — Patient Instructions (Signed)
Yes you are back in atrial fibrillation.   We are adding Lanoxin (Digoxin) 0.125 mg to your meds to help keep your heart beat from going too fast.   We are scheduling an appt. With Dr. Johney Frame for possible ablation.  We are scheduling sleep study.  Weigh daily if you can Call [phone] if weight climbs more than 3 pounds in a day or 5 pounds in a week. No salt to very little salt in your diet.  No more than 2000 mg in a day. Call if increased shortness of breath or increased swelling.  If you socks make deeper indentions in your legs.  You will see Dr. Royann Shivers back in 2 weeks

## 2012-09-10 ENCOUNTER — Encounter: Payer: Self-pay | Admitting: Cardiology

## 2012-09-10 DIAGNOSIS — Z7901 Long term (current) use of anticoagulants: Secondary | ICD-10-CM | POA: Insufficient documentation

## 2012-09-10 DIAGNOSIS — I5032 Chronic diastolic (congestive) heart failure: Secondary | ICD-10-CM | POA: Insufficient documentation

## 2012-09-10 DIAGNOSIS — I82409 Acute embolism and thrombosis of unspecified deep veins of unspecified lower extremity: Secondary | ICD-10-CM | POA: Insufficient documentation

## 2012-09-10 NOTE — Assessment & Plan Note (Signed)
Patient does have LV dysfunction at 45-50% by recent echo.  It is unknown if this is secondary to tachycardia as patient had been in atrial fibrillation with RVR for unknown time prior to admission.  He was treated with diuretics in the hospital and is now without heart failure.

## 2012-09-10 NOTE — Assessment & Plan Note (Signed)
Patient back for followup after recent hospitalization which included atrial fibrillation with rapid ventricular response. Initial cardioversion was unsuccessful sotalol was added to medical regimen and he then underwent repeat reversion which was successful. At discharge he was in sinus rhythm. At one point he had a prolonged QTC but that was stable at discharge. Today he is back in atrial fibrillation rate is 105 which is better controlled than previously 140, secondary to current medications.  I discussed this with Dr. Royann Shivers and medication Lanoxin was added for better rate control.  Previously we do not know how long Mr. Minnie was in atrial fib before he developed heart failure. Currently he has no CHF and is quite stable. Was not aware he was in atrial fibrillation. He will see Dr. Royann Shivers back in 2 weeks.  In the meantime we will schedule an appointment with Dr. Johney Frame,  EP, for possible ablation. Additionally patient will need a sleep study.

## 2012-09-10 NOTE — Assessment & Plan Note (Signed)
Doppler studies revealed possible DVT in the hospital. He is on Zarontin no initially at 50 mg twice a day until August 11 when he will begin 20 mg daily with breakfast.

## 2012-09-10 NOTE — Assessment & Plan Note (Signed)
Xarelto was started this hospitalization and has been approved by his insurance company.

## 2012-09-10 NOTE — Assessment & Plan Note (Signed)
Followed by infectious disease and orthopedic.

## 2012-09-10 NOTE — Assessment & Plan Note (Signed)
Controlled by diet

## 2012-09-20 ENCOUNTER — Telehealth: Payer: Self-pay | Admitting: Cardiovascular Disease

## 2012-09-20 MED ORDER — TRAMADOL HCL 50 MG PO TABS: ORAL_TABLET | ORAL | Status: DC

## 2012-09-20 NOTE — Telephone Encounter (Signed)
Pt called regarding his headaches. He was told not to take Ibuprofen and he wants to take something other than just Tylenol because it doesn't work for him .

## 2012-09-20 NOTE — Telephone Encounter (Signed)
Message forwarded to L. Annie Paras, NP for further instructions.  Last OV 8.1.14 in Epic.

## 2012-09-20 NOTE — Telephone Encounter (Signed)
Pt could have Ultram 50 mg 1-2 every 8 hours PRN.  #30 no refills.

## 2012-09-20 NOTE — Telephone Encounter (Signed)
Returned call and informed pt per instructions by MD/PA.  Pt verbalized understanding and agreed w/ plan.  Sherian Rein, NP notified of ? Allergy r/t Morphine.  Advised Override.  Rx sent to pharmacy.

## 2012-09-26 DIAGNOSIS — G4733 Obstructive sleep apnea (adult) (pediatric): Secondary | ICD-10-CM

## 2012-09-27 ENCOUNTER — Encounter: Payer: Self-pay | Admitting: Cardiovascular Disease

## 2012-09-27 ENCOUNTER — Ambulatory Visit (INDEPENDENT_AMBULATORY_CARE_PROVIDER_SITE_OTHER): Payer: Medicare Other | Admitting: Cardiovascular Disease

## 2012-09-27 VITALS — BP 138/98 | HR 90 | Resp 20 | Ht 68.0 in | Wt 376.7 lb

## 2012-09-27 DIAGNOSIS — R0989 Other specified symptoms and signs involving the circulatory and respiratory systems: Secondary | ICD-10-CM

## 2012-09-27 DIAGNOSIS — R0609 Other forms of dyspnea: Secondary | ICD-10-CM

## 2012-09-27 DIAGNOSIS — I480 Paroxysmal atrial fibrillation: Secondary | ICD-10-CM

## 2012-09-27 DIAGNOSIS — R0600 Dyspnea, unspecified: Secondary | ICD-10-CM

## 2012-09-27 DIAGNOSIS — I4891 Unspecified atrial fibrillation: Secondary | ICD-10-CM

## 2012-09-27 DIAGNOSIS — I5041 Acute combined systolic (congestive) and diastolic (congestive) heart failure: Secondary | ICD-10-CM

## 2012-09-27 MED ORDER — METOPROLOL TARTRATE 25 MG PO TABS: 25.0000 mg | ORAL_TABLET | Freq: Two times a day (BID) | ORAL | Status: DC

## 2012-09-27 NOTE — Patient Instructions (Addendum)
Your physician has recommended you make the following change in your medication: DISCONTINUE SOTALOL, START METOPROLOL 25MG  TWICE DAILY  Your physician has requested that you have an echocardiogram. Echocardiography is a painless test that uses sound waves to create images of your heart. It provides your doctor with information about the size and shape of your heart and how well your heart's chambers and valves are working. This procedure takes approximately one hour. There are no restrictions for this procedure.  Your physician recommends that you schedule a follow-up appointment in: 1-2 MONTHS

## 2012-10-06 ENCOUNTER — Encounter: Payer: Self-pay | Admitting: Cardiovascular Disease

## 2012-10-06 NOTE — Progress Notes (Signed)
Patient ID: [name], male   DOB: Aug 11, 1950, 62 y.o.   MRN: [id number]     Reason for office visit Atrial fibrillation, congestive heart failure followup   [name] returns after a hospitalization for congestive heart failure and atrial fibrillation. Attempts at cardioversion did not work. Even a second attempt at cardioversion following loading with sotalol and using a high energy device was only temporarily successful. He had previous paroxysmal atrial fibrillation roughly 2 years earlier. 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  He has improved from a clinical standpoint following rate control and diuretic therapy. He is now on anticoagulation with Xarelto. 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    Allergies  Allergen Reactions  . Morphine     HIVES    Current Outpatient Prescriptions  Medication Sig Dispense Refill  . digoxin (LANOXIN) 0.125 MG tablet Take 1 tablet (0.125 mg total) by mouth daily.  30 tablet  3  . diltiazem (CARDIZEM CD) 240 MG 24 hr capsule Take 1 capsule (240 mg total) by mouth daily.  30 capsule  11  . levothyroxine (SYNTHROID, LEVOTHROID) 100 MCG tablet Take 100 mcg by mouth daily before breakfast.      . Multiple Vitamin (MULTIVITAMIN WITH MINERALS) TABS Take 1 tablet by mouth daily.      . pantoprazole (PROTONIX) 40 MG tablet Take 1 tablet (40 mg total) by mouth daily at 12 noon.  30 tablet  3  . Pyridoxine HCl (VITAMIN B-6 PO) Take 1 tablet by mouth daily.      . Rivaroxaban (XARELTO) 20 MG TABS tablet Take 20 mg by mouth daily.      . sotalol (BETAPACE)  80 MG tablet Take 1 tablet (80 mg total) by mouth every 12 (twelve) hours.  60 tablet  6  . traMADol (ULTRAM) 50 MG tablet Take 1-2 tabs every 8 hours as needed for pain  30 tablet  0  . metoprolol tartrate (LOPRESSOR) 25 MG tablet Take 1 tablet (25 mg total) by mouth 2 (two) times daily.  180 tablet  3   No current facility-administered medications for this visit.    Past Medical History  Diagnosis Date  . Complication of anesthesia     PT STATES HARD TO WAKE UP AFTER ONE SUGERY -STATES THE SURGERY TOOK LONGER THAN EXPECTED.  NO PROBLEMS WITH ANY OTHER SURGERY  . Hypothyroidism   . Shortness of breath     WITH EXERTION AND PAIN  . Septic arthritis of knee     LEFT KNEE  . Pain     BACK PAIN - PT ATTRIBUTES TO THE WAY HE WALKS DUE TO LEFT KNEE PROBLEM  . Paroxysmal atrial fibrillation   . Chronic anticoagulation     wih Xarelto    Past Surgical History  Procedure Laterality Date  . Right knee arthroscopy  1998  . Left knee arthroscopy  1999  . Left knee surgery upper tibial ostomy    . Replacement left knee  2002  . Replacement right knee  2003  . Revision left knee cap   2004  . Left total knee removal for infection  2006  . Reimplantation left total knee  2006  . Removal left total knee   2008  . Reimplantation left total knee  2008  . 2010 removal of left total knee    . Hydrocelectoy   2012  . Reimplantation of total knee Left 06/23/2012    Procedure: REIMPLANTATION OF LEFT TOTAL KNEE;  Surgeon: Shelda Pal, MD;  Location: [facility];  Service: Orthopedics;  Laterality: Left;  . Tee without cardioversion N/A 08/28/2012    Procedure: TRANSESOPHAGEAL ECHOCARDIOGRAM (TEE);  Surgeon: Thurmon Fair, MD;  Location: [facility] OR;  Service: Cardiovascular;  Laterality: N/A;  . Cardioversion N/A 08/28/2012    Procedure: CARDIOVERSION;  Surgeon: Thurmon Fair, MD;  Location: [facility] OR;  Service: Cardiovascular;  Laterality: N/A;  . Cardioversion N/A 09/01/2012    Procedure: CARDIOVERSION;   Surgeon: Chrystie Nose, MD;  Location: [facility] OR;  Service: Cardiovascular;  Laterality: N/A;    Family History  Problem Relation Age of Onset  . Cancer Sister     History   Social History  . Marital Status: Single    Spouse Name: N/A    Number of Children: N/A  . Years of Education: N/A   Occupational History  . Not on file.   Social History Main Topics  . Smoking status: Never Smoker   . Smokeless tobacco: Never Used  . Alcohol Use: Yes     Comment: RARE ALCOHOL  . Drug Use: No  . Sexual Activity: Yes    Partners: Female   Other Topics Concern  . Not on file   Social History Narrative  . No narrative on file    Review of systems: The patient specifically denies any chest pain at rest or with exertion, dyspnea at rest, orthopnea, paroxysmal nocturnal dyspnea, syncope, palpitations, focal neurological deficits, intermittent claudication, lower extremity edema, unexplained weight gain, cough, hemoptysis or wheezing.  The patient also denies abdominal pain, nausea, vomiting, dysphagia, diarrhea, constipation, polyuria, polydipsia, dysuria, hematuria, frequency, urgency, abnormal bleeding or bruising, fever, chills, unexpected weight changes, mood swings, change in skin or hair texture, change in voice quality, auditory or visual problems, allergic reactions or rashes, new musculoskeletal complaints other than usual "aches and pains".   PHYSICAL EXAM BP 138/98  Pulse 90  Resp 20  Ht 5\' 8"  (1.727 m)  Wt 376 lb 11.2 oz (170.87 kg)  BMI 57.29 kg/m2  General: Alert, oriented x3, no distress, morbidly obese Head: no evidence of trauma, PERRL, EOMI, no exophtalmos or lid lag, no myxedema, no xanthelasma; normal ears, nose and oropharynx Neck: normal jugular venous pulsations and no hepatojugular reflux; brisk carotid pulses without delay and no carotid bruits Chest: clear to auscultation, no signs of consolidation by percussion or palpation, normal fremitus, symmetrical and  full respiratory excursions Cardiovascular: normal position and quality of the apical impulse, regular rhythm, normal first and second heart sounds, no murmurs, rubs or gallops Abdomen: no tenderness or distention, no masses by palpation, no abnormal pulsatility or arterial bruits, normal bowel sounds, no hepatosplenomegaly Extremities: no clubbing, cyanosis or edema; 2+ radial, ulnar and brachial pulses bilaterally; 2+ right femoral, posterior tibial and dorsalis pedis pulses; 2+ left femoral, posterior tibial and dorsalis pedis pulses; no subclavian or femoral bruits; multiple scars of old orthopedic procedures. Neurological: grossly nonfocal   EKG: atrial fibrillation  Lipid Panel     Component Value Date/Time   CHOL 109 08/27/2012 0534   TRIG 65 08/27/2012 0534   HDL 45 08/27/2012 0534   CHOLHDL 2.4 08/27/2012 0534   VLDL 13 08/27/2012 0534  LDLCALC 51 08/27/2012 0534    BMET    Component Value Date/Time   NA 138 09/02/2012 0430   K 4.1 09/02/2012 0430   CL 102 09/02/2012 0430   CO2 28 09/02/2012 0430   GLUCOSE 93 09/02/2012 0430   BUN 15 09/02/2012 0430   CREATININE 0.99 09/02/2012 0430   CREATININE 1.43* 10/19/2011 1707   CALCIUM 10.0 09/02/2012 0430   GFRNONAA 87* 09/02/2012 0430   GFRAA >90 09/02/2012 0430     ASSESSMENT AND PLAN Acute combined (predominantly diastolic) heart failure, improved He has done much better once we achieved adequate ventricular rate control. Suspicion is that atrial fibrillation has been going on a lot longer than Numa realized. 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 He repeat echocardiogram is indicated. Our management could be quite different if there is evidence of complete resolution of left ventricular systolic dysfunction versus persistent left ventricular failure.  Persistent atrial fibrillation At [facility] request we have made arrangements for evaluation by an electrophysiologist. The initial impression was that he had recent onset  atrial fibrillation, but subsequent events suggest that Atrial fibrillation may be longer lasting. He failed cardioversion even with a high energy defibrillator and antiarrhythmic therapy. It is possible that his obesity is causing problems with cardioversion. Internal cardioversion is a consideration. He had excessive QT interval prolongation with class III agents. His left atrium is very dilated. It may be that the best option is to provide lifelong anticoagulation and rate control therapy.  Morbid obesity A sleep study has been ordered. The presence of sleep apnea may have a lot to do with his arrhythmia.  Orders Placed This Encounter  Procedures  . EKG 12-Lead  . 2D Echocardiogram with contrast   Meds ordered this encounter  Medications  . Rivaroxaban (XARELTO) 20 MG TABS tablet    Sig: Take 20 mg by mouth daily.  . metoprolol tartrate (LOPRESSOR) 25 MG tablet    Sig: Take 1 tablet (25 mg total) by mouth 2 (two) times daily.    Dispense:  180 tablet    Refill:  3    Kishaun Erekson  Thurmon Fair, MD, [facility] [phone] office [phone] pager

## 2012-10-06 NOTE — Assessment & Plan Note (Signed)
He has done much better once we achieved adequate ventricular rate control. Suspicion is that atrial fibrillation has been going on a lot longer than [name] realized. 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 He repeat echocardiogram is indicated. Our management could be quite different if there is evidence of complete resolution of left ventricular systolic dysfunction versus persistent left ventricular failure.

## 2012-10-06 NOTE — Assessment & Plan Note (Signed)
At [facility] request we have made arrangements for evaluation by an electrophysiologist. The initial impression was that he had recent onset atrial fibrillation, but subsequent events suggest that Atrial fibrillation may be longer lasting. He failed cardioversion even with a high energy defibrillator and antiarrhythmic therapy. It is possible that his obesity is causing problems with cardioversion. Internal cardioversion is a consideration. He had excessive QT interval prolongation with class III agents. His left atrium is very dilated. It may be that the best option is to provide lifelong anticoagulation and rate control therapy.

## 2012-10-06 NOTE — Assessment & Plan Note (Signed)
A sleep study has been ordered. The presence of sleep apnea may have a lot to do with his arrhythmia.

## 2012-10-11 ENCOUNTER — Ambulatory Visit (HOSPITAL_COMMUNITY)
Admission: RE | Admit: 2012-10-11 | Discharge: 2012-10-11 | Disposition: A | Discharge status: Home / Self Care | Payer: Medicare Other | Source: Ambulatory Visit | Attending: Cardiovascular Disease | Admitting: Cardiovascular Disease

## 2012-10-11 DIAGNOSIS — R0989 Other specified symptoms and signs involving the circulatory and respiratory systems: Secondary | ICD-10-CM | POA: Insufficient documentation

## 2012-10-11 DIAGNOSIS — N189 Chronic kidney disease, unspecified: Secondary | ICD-10-CM | POA: Insufficient documentation

## 2012-10-11 DIAGNOSIS — E119 Type 2 diabetes mellitus without complications: Secondary | ICD-10-CM | POA: Insufficient documentation

## 2012-10-11 DIAGNOSIS — Z86711 Personal history of pulmonary embolism: Secondary | ICD-10-CM | POA: Insufficient documentation

## 2012-10-11 DIAGNOSIS — R0609 Other forms of dyspnea: Secondary | ICD-10-CM | POA: Insufficient documentation

## 2012-10-11 DIAGNOSIS — I4891 Unspecified atrial fibrillation: Secondary | ICD-10-CM | POA: Insufficient documentation

## 2012-10-11 DIAGNOSIS — I509 Heart failure, unspecified: Secondary | ICD-10-CM | POA: Insufficient documentation

## 2012-10-11 DIAGNOSIS — R0600 Dyspnea, unspecified: Secondary | ICD-10-CM

## 2012-10-11 NOTE — Progress Notes (Signed)
[facility]   2D echo completed 10/11/2012.   [name] Kura Bethards, RDCS  

## 2012-10-23 ENCOUNTER — Encounter: Payer: Self-pay | Admitting: Internal Medicine

## 2012-10-23 ENCOUNTER — Ambulatory Visit (INDEPENDENT_AMBULATORY_CARE_PROVIDER_SITE_OTHER): Payer: Medicare Other | Admitting: Internal Medicine

## 2012-10-23 VITALS — BP 108/79 | Ht 68.0 in | Wt 382.8 lb

## 2012-10-23 DIAGNOSIS — I4891 Unspecified atrial fibrillation: Secondary | ICD-10-CM

## 2012-10-23 DIAGNOSIS — I480 Paroxysmal atrial fibrillation: Secondary | ICD-10-CM

## 2012-10-23 DIAGNOSIS — I4819 Other persistent atrial fibrillation: Secondary | ICD-10-CM

## 2012-10-23 MED ORDER — METOPROLOL TARTRATE 50 MG PO TABS: 50.0000 mg | ORAL_TABLET | Freq: Two times a day (BID) | ORAL | Status: DC

## 2012-10-23 NOTE — Patient Instructions (Signed)
Your physician recommends that you schedule a follow-up appointment in: with Dr Royann Shivers as scheduled and Dr Johney Frame as needed  Your physician has recommended you make the following change in your medication:  1) Stop Sotalol 2) Increase Metoprolol to 50mg  twice daily

## 2012-10-25 ENCOUNTER — Encounter: Payer: Self-pay | Admitting: Internal Medicine

## 2012-10-25 NOTE — Progress Notes (Signed)
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 He reports initially being diagnosed with atrial fibrillation approximately 2 years ago.  His afib burden was not closely followed.  He recently presented with symptoms of CHF and afib.  He was evaluated and underwent cardioversion.  This was unsuccessful.  He was placed on sotalol and underwent repeat cardioversion.  Unfortunately, he did not maintain sinus rhythm.  He has since been treated with rate control.  He is now doing reasonably well.  He reports that with adequate rate control that his exercise tolerance has improved.  He has occasional fatigue.  He is appropriately anticoagulated with xarelto.  Today, he denies symptoms of palpitations, chest pain, shortness of breath, orthopnea, PND, lower extremity edema, dizziness, presyncope, syncope, or neurologic sequela. The patient is tolerating medications without difficulties and is otherwise without complaint today.   Past Medical History  Diagnosis Date  . Complication of anesthesia     PT STATES HARD TO WAKE UP AFTER ONE SUGERY -STATES THE SURGERY TOOK LONGER THAN EXPECTED.  NO PROBLEMS WITH ANY OTHER SURGERY  . Hypothyroidism   . Shortness of breath     WITH EXERTION AND PAIN  . Septic arthritis of knee     LEFT KNEE  . Pain     BACK PAIN - PT ATTRIBUTES TO THE WAY HE WALKS DUE TO LEFT KNEE PROBLEM  . Persistent atrial fibrillation   . Chronic anticoagulation     wih Xarelto   Past Surgical History  Procedure Laterality Date  . Right knee arthroscopy  1998  . Left knee arthroscopy  1999  . Left knee surgery upper tibial ostomy    . Replacement left knee  2002  . Replacement right knee  2003  . Revision left knee cap   2004  . Left total knee removal for infection  2006  . Reimplantation left total knee   2006  . Removal left total  knee   2008  . Reimplantation left total knee  2008  . 2010 removal of left total knee    . Hydrocelectoy   2012  . Reimplantation of total knee Left 06/23/2012    Procedure: REIMPLANTATION OF LEFT TOTAL KNEE;  Surgeon: Garrett Pal, MD;  Location: [facility];  Service: Orthopedics;  Laterality: Left;  . Tee without cardioversion N/A 08/28/2012    Procedure: TRANSESOPHAGEAL ECHOCARDIOGRAM (TEE);  Surgeon: Garrett Fair, MD;  Location: [facility] OR;  Service: Cardiovascular;  Laterality: N/A;  . Cardioversion N/A 08/28/2012    Procedure: CARDIOVERSION;  Surgeon: Garrett Fair, MD;  Location: [facility] OR;  Service: Cardiovascular;  Laterality: N/A;  . Cardioversion N/A 09/01/2012    Procedure: CARDIOVERSION;  Surgeon: Garrett Nose, MD;  [facility]: [name] OR;  Service: Cardiovascular;  Laterality: N/A;    Current Outpatient Prescriptions  Medication Sig Dispense Refill  . digoxin (LANOXIN) 0.125 MG tablet Take 1 tablet (0.125 mg total) by mouth daily.  30 tablet  3  . diltiazem (CARDIZEM CD) 240 MG 24 hr capsule Take 1 capsule (240 mg total) by mouth daily.  30 capsule  11  . levothyroxine (SYNTHROID, LEVOTHROID) 100 MCG tablet Take 100 mcg by mouth daily before breakfast.      . metoprolol tartrate (LOPRESSOR) 50 MG tablet Take 1 tablet (50 mg total) by mouth 2 (two) times daily.  180 tablet  3  . Multiple Vitamin (MULTIVITAMIN WITH MINERALS) TABS Take 1 tablet by mouth daily.      . pantoprazole (PROTONIX) 40 MG tablet Take 1 tablet (40 mg total) by mouth daily at 12 noon.  30 tablet  3  . Pyridoxine HCl (VITAMIN B-6 PO) Take 1 tablet by mouth daily.      . Rivaroxaban (XARELTO) 20 MG TABS tablet Take 20 mg by mouth daily.      . traMADol (ULTRAM) 50 MG tablet Take 1-2 tabs every 8 hours as needed for pain  30 tablet  0   No current facility-administered medications for this visit.    Allergies  Allergen Reactions  . Morphine     HIVES    History   Social History  . Marital Status: Single     Spouse Name: N/A    Number of Children: N/A  . Years of Education: N/A   Occupational History  . Not on file.   Social History Main Topics  . Smoking status: Never Smoker   . Smokeless tobacco: Never Used  . Alcohol Use: Yes     Comment: RARE ALCOHOL  . Drug Use: No  . Sexual Activity: Yes    Partners: Female   Other Topics Concern  . Not on file   Social History Narrative  . No narrative on file    Family History  Problem Relation Age of Onset  . Cancer Sister     ROS- All systems are reviewed and negative except as per the HPI above  Physical Exam: Filed Vitals:   10/23/12 0933  BP: 108/79  Height: 5\' 8"  (1.727 m)  Weight: 382 lb 12.8 oz (173.637 kg)    GEN- The patient is well appearing, alert and oriented x 3 today.   Head- normocephalic, atraumatic Eyes-  Sclera clear, conjunctiva pink Ears- hearing intact Oropharynx- clear Neck- supple, no JVP Lymph- no cervical lymphadenopathy Lungs- Clear to ausculation bilaterally, normal work of breathing Heart- irregular rate and rhythm, no murmurs, rubs or gallops, PMI not laterally displaced GI- soft, NT, ND, + BS Extremities- no clubbing, cyanosis, or edema MS- no significant deformity or atrophy Skin- no rash or lesion Psych- euthymic mood, full affect Neuro- strength and sensation are intact  EKG today reveals afib, V rate 95 bpm, nonspecific ST/ T changes Echo reveals preserved EF with severe LA enlargement  Assessment and Plan:  1. Persistent atrial fibrillation The patient presents with difficult to control persistent afib.  He is appropriately anticoagulated with xarelto.  His LA is severely enlarged.  I suspect that his afib is more longstanding than we know.  I think that our ability to achieve and maintain sinus rhythm long term is very low.  Fortunately, he is clinically improved with rate control and his EF has recovered.  I agree with [name] that rate control is the most appropriate strategy  going forward.  I will stop sotalol today.  Increase metoprolol to 50mg  BID.  This can be further uptitrated as needed. He will continue to follow with [name] and I will see as needed going forward.

## 2012-11-09 ENCOUNTER — Encounter: Payer: Self-pay | Admitting: Cardiovascular Disease

## 2012-11-27 ENCOUNTER — Encounter: Payer: Self-pay | Admitting: Cardiovascular Disease

## 2012-11-27 ENCOUNTER — Ambulatory Visit (INDEPENDENT_AMBULATORY_CARE_PROVIDER_SITE_OTHER): Payer: Medicare Other | Admitting: Cardiovascular Disease

## 2012-11-27 VITALS — BP 110/80 | HR 82 | Resp 20 | Ht 68.0 in | Wt 381.4 lb

## 2012-11-27 DIAGNOSIS — I5041 Acute combined systolic (congestive) and diastolic (congestive) heart failure: Secondary | ICD-10-CM

## 2012-11-27 DIAGNOSIS — I4891 Unspecified atrial fibrillation: Secondary | ICD-10-CM

## 2012-11-27 DIAGNOSIS — G4733 Obstructive sleep apnea (adult) (pediatric): Secondary | ICD-10-CM | POA: Insufficient documentation

## 2012-11-27 DIAGNOSIS — I4819 Other persistent atrial fibrillation: Secondary | ICD-10-CM

## 2012-11-27 NOTE — Patient Instructions (Signed)
Your physician recommends that you schedule a follow-up appointment in: 6 months  

## 2012-11-27 NOTE — Assessment & Plan Note (Signed)
I have strongly encouraged him to continue his efforts to adjust the treatment with CPAP. I do think that eventually he will notice the benefits that he will repeat from an interrupted sleep at night.

## 2012-11-27 NOTE — Assessment & Plan Note (Signed)
Suspect that he actually has now settled into a pattern of permanent atrial fibrillation. Although it is not impossible that he may return to normal sinus rhythm, it is highly unlikely that he will maintain sinus rhythm for long periods of time, given the degree of atrial dilatation. He needs to continue lifelong warfarin anticoagulation or an equivalent drug.

## 2012-11-27 NOTE — Assessment & Plan Note (Addendum)
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 He requires 3 different agents to achieve good ventricular rate control. Currently appears to be euvolemic. NYHA functional class II. Limitation is primarily related to obesity and joint problems.

## 2012-11-27 NOTE — Progress Notes (Signed)
Patient ID: [name], male   DOB: 01-14-1951, 62 y.o.   MRN: [id number]      Reason for office visit Atrial fibrillation, congestive heart failure  Zai remains in atrial fibrillation but rate control is now excellent. Repeat echocardiography shows substantial improvement in left ventricular systolic function, with an LVEF that is now virtually normal. His initial presentation was with left ventricular systolic and diastolic failure in the setting of atrial fibrillation rapid ventricular response. He has never had angina pectoris. He does have coronary artery calcifications by CT angiography of the chest. His sleep study was abnormal and he is now struggling to adjust to treatment with CPAP. One of his problems is that the air leak from the mask leads to dry eyes which are very uncomfortable in the mornings. As always his major limitations are related to his knee problems. Has had a total of 10 surgeries on his left knee and a couple on his right knee. He has previously had osteomyelitis and has required hardware removal. He is morbidly obese.   Allergies  Allergen Reactions  . Morphine     HIVES    Current Outpatient Prescriptions  Medication Sig Dispense Refill  . digoxin (LANOXIN) 0.125 MG tablet Take 1 tablet (0.125 mg total) by mouth daily.  30 tablet  3  . diltiazem (CARDIZEM CD) 240 MG 24 hr capsule Take 1 capsule (240 mg total) by mouth daily.  30 capsule  11  . levothyroxine (SYNTHROID, LEVOTHROID) 100 MCG tablet Take 100 mcg by mouth daily before breakfast.      . metoprolol tartrate (LOPRESSOR) 50 MG tablet Take 1 tablet (50 mg total) by mouth 2 (two) times daily.  180 tablet  3  . Multiple Vitamin (MULTIVITAMIN WITH MINERALS) TABS Take 1 tablet by mouth daily.      . pantoprazole (PROTONIX) 40 MG tablet Take 1 tablet (40 mg total) by mouth daily at 12 noon.  30 tablet  3  . Pyridoxine HCl (VITAMIN B-6 PO) Take 1 tablet by mouth daily.      . Rivaroxaban (XARELTO) 20 MG TABS  tablet Take 20 mg by mouth daily.      . traMADol (ULTRAM) 50 MG tablet Take 1-2 tabs every 8 hours as needed for pain  30 tablet  0   No current facility-administered medications for this visit.    Past Medical History  Diagnosis Date  . Complication of anesthesia     PT STATES HARD TO WAKE UP AFTER ONE SUGERY -STATES THE SURGERY TOOK LONGER THAN EXPECTED.  NO PROBLEMS WITH ANY OTHER SURGERY  . Hypothyroidism   . Shortness of breath     WITH EXERTION AND PAIN  . Septic arthritis of knee     LEFT KNEE  . Pain     BACK PAIN - PT ATTRIBUTES TO THE WAY HE WALKS DUE TO LEFT KNEE PROBLEM  . Persistent atrial fibrillation   . Chronic anticoagulation     wih Xarelto    Past Surgical History  Procedure Laterality Date  . Right knee arthroscopy  1998  . Left knee arthroscopy  1999  . Left knee surgery upper tibial ostomy    . Replacement left knee  2002  . Replacement right knee  2003  . Revision left knee cap   2004  . Left total knee removal for infection  2006  . Reimplantation left total knee   2006  . Removal left total knee   2008  . Reimplantation  left total knee  2008  . 2010 removal of left total knee    . Hydrocelectoy   2012  . Reimplantation of total knee Left 06/23/2012    Procedure: REIMPLANTATION OF LEFT TOTAL KNEE;  Surgeon: Shelda Pal, MD;  Location: [facility];  Service: Orthopedics;  Laterality: Left;  . Tee without cardioversion N/A 08/28/2012    Procedure: TRANSESOPHAGEAL ECHOCARDIOGRAM (TEE);  Surgeon: Thurmon Fair, MD;  Location: [facility] OR;  Service: Cardiovascular;  Laterality: N/A;  . Cardioversion N/A 08/28/2012    Procedure: CARDIOVERSION;  Surgeon: Thurmon Fair, MD;  Location: [facility] OR;  Service: Cardiovascular;  Laterality: N/A;  . Cardioversion N/A 09/01/2012    Procedure: CARDIOVERSION;  Surgeon: Chrystie Nose, MD;  Location[facility] OR;  Service: Cardiovascular;  Laterality: N/A;    Family History  Problem Relation Age of Onset  . Cancer Sister      History   Social History  . Marital Status: Single    Spouse Name: N/A    Number of Children: N/A  . Years of Education: N/A   Occupational History  . Not on file.   Social History Main Topics  . Smoking status: Never Smoker   . Smokeless tobacco: Never Used  . Alcohol Use: Yes     Comment: RARE ALCOHOL  . Drug Use: No  . Sexual Activity: Yes    Partners: Female   Other Topics Concern  . Not on file   Social History Narrative  . No narrative on file    Review of systems: The patient specifically denies any chest pain at rest or with exertion, dyspnea at rest, orthopnea, paroxysmal nocturnal dyspnea, syncope, palpitations, focal neurological deficits, intermittent claudication, unexplained weight gain, cough, hemoptysis or wheezing. He has no more than mild ankle and calf edema, usually worse on the left.  The patient also denies abdominal pain, nausea, vomiting, dysphagia, diarrhea, constipation, polyuria, polydipsia, dysuria, hematuria, frequency, urgency, abnormal bleeding or bruising, fever, chills, unexpected weight changes, mood swings, change in skin or hair texture, change in voice quality, auditory or visual problems, allergic reactions or rashes, new musculoskeletal complaints other than usual "aches and pains".   PHYSICAL EXAM BP 110/80  Pulse 82  Resp 20  Ht 5\' 8"  (1.727 m)  Wt 381 lb 6.4 oz (173.002 kg)  BMI 58.01 kg/m2 General: Alert, oriented x3, no distress, morbidly obese  Head: no evidence of trauma, PERRL, EOMI, no exophtalmos or lid lag, no myxedema, no xanthelasma; normal ears, nose and oropharynx  Neck: normal jugular venous pulsations and no hepatojugular reflux; brisk carotid pulses without delay and no carotid bruits  Chest: clear to auscultation, no signs of consolidation by percussion or palpation, normal fremitus, symmetrical and full respiratory excursions  Cardiovascular: normal position and quality of the apical impulse, regular rhythm,  normal first and second heart sounds, no murmurs, rubs or gallops  Abdomen: no tenderness or distention, no masses by palpation, no abnormal pulsatility or arterial bruits, normal bowel sounds, no hepatosplenomegaly  Extremities: no clubbing, cyanosis or edema; 2+ radial, ulnar and brachial pulses bilaterally; 2+ right femoral, posterior tibial and dorsalis pedis pulses; 2+ left femoral, posterior tibial and dorsalis pedis pulses; no subclavian or femoral bruits; multiple scars of old orthopedic procedures.  Neurological: grossly nonfocal   EKG: Atrial fibrillation otherwise normal  Lipid Panel     Component Value Date/Time   CHOL 109 08/27/2012 0534   TRIG 65 08/27/2012 0534   HDL 45 08/27/2012 0534   CHOLHDL 2.4 08/27/2012  0534   VLDL 13 08/27/2012 0534   LDLCALC 51 08/27/2012 0534    BMET    Component Value Date/Time   NA 138 09/02/2012 0430   K 4.1 09/02/2012 0430   CL 102 09/02/2012 0430   CO2 28 09/02/2012 0430   GLUCOSE 93 09/02/2012 0430   BUN 15 09/02/2012 0430   CREATININE 0.99 09/02/2012 0430   CREATININE 1.43* 10/19/2011 1707   CALCIUM 10.0 09/02/2012 0430   GFRNONAA 87* 09/02/2012 0430   GFRAA >90 09/02/2012 0430     ASSESSMENT AND PLAN Acute combined (predominantly diastolic) heart failure, improved The gradual improvement in left ventricular systolic function 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 He requires 3 different agents to achieve good ventricular rate control. Currently appears to be euvolemic. NYHA functional class II. Limitation is primarily related to obesity and joint problems.  Persistent atrial fibrillation Suspect that he actually has now settled into a pattern of permanent atrial fibrillation. Although it is not impossible that he may return to normal sinus rhythm, it is highly unlikely that he will maintain sinus rhythm for long periods of time, given the degree of atrial dilatation. He needs to continue lifelong warfarin  anticoagulation or an equivalent drug.  OSA on CPAP I have strongly encouraged him to continue his efforts to adjust the treatment with CPAP. I do think that eventually he will notice the benefits that he will repeat from an interrupted sleep at night.  Orders Placed This Encounter  Procedures  . EKG 12-Lead   No orders of the defined types were placed in this encounter.    Junious Silk, MD, [facility] [phone] office [phone] pager

## 2012-11-28 ENCOUNTER — Encounter: Payer: Self-pay | Admitting: Cardiovascular Disease

## 2012-12-22 ENCOUNTER — Telehealth: Payer: Self-pay | Admitting: Cardiovascular Disease

## 2012-12-22 NOTE — Telephone Encounter (Signed)
Note sent to [facility] to call patient. I don't know the reason for his visit. The DME companies schedule these appointments.

## 2012-12-22 NOTE — Telephone Encounter (Signed)
Please call regarding his sleep clinic appt.  Does not understand why he is having to come and has not heard from anyone .  Please call

## 2012-12-22 NOTE — Telephone Encounter (Signed)
Message forwarded to W. Waddell, CMA.  

## 2012-12-26 ENCOUNTER — Ambulatory Visit: Payer: Self-pay | Admitting: Cardiovascular Disease

## 2012-12-28 ENCOUNTER — Other Ambulatory Visit: Payer: Self-pay

## 2012-12-28 MED ORDER — DIGOXIN 125 MCG PO TABS: 0.1250 mg | ORAL_TABLET | Freq: Every day | ORAL | Status: DC

## 2012-12-28 NOTE — Telephone Encounter (Signed)
Rx was sent to pharmacy electronically. 

## 2013-01-25 ENCOUNTER — Emergency Department (HOSPITAL_COMMUNITY): Payer: Medicare Other

## 2013-01-25 ENCOUNTER — Encounter (HOSPITAL_COMMUNITY): Payer: Self-pay | Admitting: Emergency Medicine

## 2013-01-25 ENCOUNTER — Emergency Department (HOSPITAL_COMMUNITY)
Admission: EM | Admit: 2013-01-25 | Discharge: 2013-01-26 | Disposition: A | Discharge status: Home / Self Care | Payer: Medicare Other | Attending: Emergency Medicine | Admitting: Emergency Medicine

## 2013-01-25 DIAGNOSIS — R112 Nausea with vomiting, unspecified: Secondary | ICD-10-CM

## 2013-01-25 DIAGNOSIS — R0602 Shortness of breath: Secondary | ICD-10-CM | POA: Insufficient documentation

## 2013-01-25 DIAGNOSIS — R079 Chest pain, unspecified: Secondary | ICD-10-CM | POA: Insufficient documentation

## 2013-01-25 DIAGNOSIS — M009 Pyogenic arthritis, unspecified: Secondary | ICD-10-CM | POA: Insufficient documentation

## 2013-01-25 DIAGNOSIS — R1013 Epigastric pain: Secondary | ICD-10-CM

## 2013-01-25 DIAGNOSIS — E669 Obesity, unspecified: Secondary | ICD-10-CM | POA: Insufficient documentation

## 2013-01-25 DIAGNOSIS — Z7901 Long term (current) use of anticoagulants: Secondary | ICD-10-CM | POA: Insufficient documentation

## 2013-01-25 DIAGNOSIS — I4891 Unspecified atrial fibrillation: Secondary | ICD-10-CM | POA: Insufficient documentation

## 2013-01-25 DIAGNOSIS — Z79899 Other long term (current) drug therapy: Secondary | ICD-10-CM | POA: Insufficient documentation

## 2013-01-25 DIAGNOSIS — E039 Hypothyroidism, unspecified: Secondary | ICD-10-CM | POA: Insufficient documentation

## 2013-01-25 LAB — LIPASE, BLOOD: Lipase: 40 U/L (ref 11–59)

## 2013-01-25 LAB — BASIC METABOLIC PANEL
BUN: 25 mg/dL — ABNORMAL HIGH (ref 6–23)
Calcium: 9.5 mg/dL (ref 8.4–10.5)
Creatinine, Ser: 0.99 mg/dL (ref 0.50–1.35)
GFR calc Af Amer: >90 mL/min (ref >90)
GFR calc non Af Amer: 87 mL/min — ABNORMAL LOW (ref >90)
Glucose, Bld: 139 mg/dL — ABNORMAL HIGH (ref 70–99)
Sodium: 138 mEq/L (ref 135–145)

## 2013-01-25 LAB — APTT: aPTT: 36 s (ref 24–37)

## 2013-01-25 LAB — CBC
HCT: 39.4 % (ref 39.0–52.0)
Hemoglobin: 12.8 g/dL — ABNORMAL LOW (ref 13.0–17.0)
MCH: 30 pg (ref 26.0–34.0)
MCHC: 32.5 g/dL (ref 30.0–36.0)
MCV: 92.5 fL (ref 78.0–100.0)
Platelets: 201 K/uL (ref 150–400)
RBC: 4.26 MIL/uL (ref 4.22–5.81)
RDW: 14.3 % (ref 11.5–15.5)
WBC: 6.7 K/uL (ref 4.0–10.5)

## 2013-01-25 LAB — HEPATIC FUNCTION PANEL
ALT: 10 U/L (ref 0–53)
AST: 13 U/L (ref 0–37)
Albumin: 3.8 g/dL (ref 3.5–5.2)
Alkaline Phosphatase: 72 U/L (ref 39–117)
Bilirubin, Direct: <0.1 mg/dL (ref 0.0–0.3)
Total Bilirubin: 0.4 mg/dL (ref 0.3–1.2)
Total Protein: 7.1 g/dL (ref 6.0–8.3)

## 2013-01-25 LAB — BASIC METABOLIC PANEL WITH GFR
CO2: 25 meq/L (ref 19–32)
Chloride: 104 meq/L (ref 96–112)
Potassium: 4.6 meq/L (ref 3.5–5.1)

## 2013-01-25 LAB — POCT I-STAT TROPONIN I

## 2013-01-25 LAB — PROTIME-INR
INR: 1.22 (ref 0.00–1.49)
Prothrombin Time: 15.1 s (ref 11.6–15.2)

## 2013-01-25 MED ORDER — FENTANYL CITRATE 0.05 MG/ML IJ SOLN
100.0000 ug | INTRAMUSCULAR | Status: DC | PRN
  Administered 2013-01-25 – 2013-01-26 (×2): 100 ug via INTRAVENOUS
  Filled 2013-01-25 (×3): qty 2

## 2013-01-25 MED ORDER — IOHEXOL 300 MG/ML  SOLN
100.0000 mL | Freq: Once | INTRAMUSCULAR | Status: AC | PRN
  Administered 2013-01-25: 100 mL via INTRAVENOUS

## 2013-01-25 MED ORDER — ASPIRIN 325 MG PO TABS
325.0000 mg | ORAL_TABLET | ORAL | Status: AC
  Filled 2013-01-25: qty 1

## 2013-01-25 MED ORDER — ASPIRIN 81 MG PO CHEW
CHEWABLE_TABLET | ORAL | Status: AC
  Administered 2013-01-25: 324 mg
  Filled 2013-01-25: qty 4

## 2013-01-25 MED ORDER — ONDANSETRON HCL 4 MG/2ML IJ SOLN
4.0000 mg | Freq: Once | INTRAMUSCULAR | Status: AC
  Administered 2013-01-25: 4 mg via INTRAVENOUS
  Filled 2013-01-25: qty 2

## 2013-01-25 MED ORDER — IOHEXOL 300 MG/ML  SOLN
25.0000 mL | Freq: Once | INTRAMUSCULAR | Status: AC | PRN
  Administered 2013-01-25: 25 mL via ORAL

## 2013-01-25 MED ORDER — NITROGLYCERIN 0.4 MG SL SUBL
0.4000 mg | SUBLINGUAL_TABLET | SUBLINGUAL | Status: DC | PRN
  Administered 2013-01-25 (×2): 0.4 mg via SUBLINGUAL

## 2013-01-25 NOTE — ED Notes (Addendum)
Presents with onset of bilateral chest pressure began at 7pm today associated with nausea, vomiting, SOB and worse pain with inspiration. Pain is described as pressure.  Pain rated 8/10. EKG shows Afib.  PVCs noted. Alert and oriented.  APin is described as constant and nothing makes pain better

## 2013-01-25 NOTE — ED Provider Notes (Addendum)
Medical screening examination/treatment/procedure(s) were conducted as a shared visit with non-physician practitioner(s) and myself.  I personally evaluated the patient during the encounter.  EKG Interpretation    Date/Time:  Thursday January 25 2013 20:55:42 EST Ventricular Rate:  91 PR Interval:    QRS Duration: 102 QT Interval:  378 QTC Calculation: 464 R Axis:   -18 Text Interpretation:  Atrial fibrillation , new since most recent prior ECG Nonspecific ST and T wave abnormality , probably digitalis effect Prolonged QT Abnormal ECG Confirmed by [facility]  MD, MICHEAL ([id number]) on 01/25/2013 11:26:55 PM            Pt with upper epigastric and RUQ and right side lower chest pain a few hours after eating.  Pt with N/V.  No fevers, chills.  WBC is normal.  Pt with mild tenderness on exam of upper epigastrium, mild guarding, no rebound.  Pt is morbidly obese, difficult to examine.  Doubt ACS, although pt ahs some cardiac history, is on xarelto for atrial fib.  No asymetric lower ext edema.  No recent travel.  Will add LFT's and lipase to labs and obtain imaging of abd, continue to monitor, cycle enzymes.  If all neg with no biliary colic, no bowel obstruction and negative markers times 2, if pt is symptomatically improved and tolerating PO's, could be discharged to home with close follow up.       Gavin Pound. Oletta Lamas, MD 01/25/13 2328  Gavin Pound. Oletta Lamas, MD 01/26/13 2952

## 2013-01-25 NOTE — ED Provider Notes (Signed)
CSN: [id number]     Arrival date & time 01/25/13  2052 History   First MD Initiated Contact with Patient 01/25/13 2136     Chief Complaint  Patient presents with  . Chest Pain   (Consider location/radiation/quality/duration/timing/severity/associated sxs/prior Treatment) Patient is a 62 y.o. male presenting with chest pain. The history is provided by the patient and medical records. No language interpreter was used.  Chest Pain Associated symptoms: abdominal pain, nausea, shortness of breath and vomiting   Associated symptoms: no back pain, no cough, no diaphoresis, no fatigue, no fever and no headache     [name] is a 62 y.o. male  with a hx of hypothyroid, SOB, a-fin (on Xarelto) presents to the Emergency Department complaining of gradual, persistent, progressively worsening chest pain onset 4pm; several hours after eating fried chicken. She reports nausea and several associated episodes of emesis, moderate amount.  Nothing seems to make his symptoms better or worse.  Pt denies fever, chills, headache, neck pain, diarrhea, weakness, dizziness, syncope, dysuria.     Past Medical History  Diagnosis Date  . Complication of anesthesia     PT STATES HARD TO WAKE UP AFTER ONE SUGERY -STATES THE SURGERY TOOK LONGER THAN EXPECTED.  NO PROBLEMS WITH ANY OTHER SURGERY  . Hypothyroidism   . Shortness of breath     WITH EXERTION AND PAIN  . Septic arthritis of knee     LEFT KNEE  . Pain     BACK PAIN - PT ATTRIBUTES TO THE WAY HE WALKS DUE TO LEFT KNEE PROBLEM  . Persistent atrial fibrillation   . Chronic anticoagulation     wih Xarelto   Past Surgical History  Procedure Laterality Date  . Right knee arthroscopy  1998  . Left knee arthroscopy  1999  . Left knee surgery upper tibial ostomy    . Replacement left knee  2002  . Replacement right knee  2003  . Revision left knee cap   2004  . Left total knee removal for infection  2006  . Reimplantation left total knee   2006  .  Removal left total knee   2008  . Reimplantation left total knee  2008  . 2010 removal of left total knee    . Hydrocelectoy   2012  . Reimplantation of total knee Left 06/23/2012    Procedure: REIMPLANTATION OF LEFT TOTAL KNEE;  Surgeon: Shelda Pal, MD;  Location: [facility];  Service: Orthopedics;  Laterality: Left;  . Tee without cardioversion N/A 08/28/2012    Procedure: TRANSESOPHAGEAL ECHOCARDIOGRAM (TEE);  Surgeon: Thurmon Fair, MD;  Location[facility] OR;  Service: Cardiovascular;  Laterality: N/A;  . Cardioversion N/A 08/28/2012    Procedure: CARDIOVERSION;  Surgeon: Thurmon Fair, MD;  Location: [facility] OR;  Service: Cardiovascular;  Laterality: N/A;  . Cardioversion N/A 09/01/2012    Procedure: CARDIOVERSION;  Surgeon: Chrystie Nose, MD;  Location: [facility] OR;  Service: Cardiovascular;  Laterality: N/A;   Family History  Problem Relation Age of Onset  . Cancer Sister    History  Substance Use Topics  . Smoking status: Never Smoker   . Smokeless tobacco: Never Used  . Alcohol Use: Yes     Comment: RARE ALCOHOL    Review of Systems  Constitutional: Negative for fever, diaphoresis, appetite change, fatigue and unexpected weight change.  HENT: Negative for mouth sores.   Eyes: Negative for visual disturbance.  Respiratory: Positive for shortness of breath. Negative for cough, chest tightness and  wheezing.   Cardiovascular: Positive for chest pain.  Gastrointestinal: Positive for nausea, vomiting and abdominal pain. Negative for diarrhea and constipation.  Endocrine: Negative for polydipsia, polyphagia and polyuria.  Genitourinary: Negative for dysuria, urgency, frequency and hematuria.  Musculoskeletal: Negative for back pain and neck stiffness.  Skin: Negative for rash.  Allergic/Immunologic: Negative for immunocompromised state.  Neurological: Negative for syncope, light-headedness and headaches.  Hematological: Does not bruise/bleed easily.  Psychiatric/Behavioral: Negative for  sleep disturbance. The patient is not nervous/anxious.     Allergies  Morphine  Home Medications   Current Outpatient Rx  Name  Route  Sig  Dispense  Refill  . digoxin (LANOXIN) 0.125 MG tablet   Oral   Take 1 tablet (0.125 mg total) by mouth daily.   30 tablet   5   . diltiazem (CARDIZEM CD) 240 MG 24 hr capsule   Oral   Take 1 capsule (240 mg total) by mouth daily.   30 capsule   11   . levothyroxine (SYNTHROID, LEVOTHROID) 100 MCG tablet   Oral   Take 100 mcg by mouth daily before breakfast.         . metoprolol tartrate (LOPRESSOR) 50 MG tablet   Oral   Take 1 tablet (50 mg total) by mouth 2 (two) times daily.   180 tablet   3   . Multiple Vitamin (MULTIVITAMIN WITH MINERALS) TABS   Oral   Take 1 tablet by mouth daily.         . pantoprazole (PROTONIX) 40 MG tablet   Oral   Take 1 tablet (40 mg total) by mouth daily at 12 noon.   30 tablet   3   . Pyridoxine HCl (VITAMIN B-6 PO)   Oral   Take 1 tablet by mouth daily.         . Rivaroxaban (XARELTO) 20 MG TABS tablet   Oral   Take 20 mg by mouth every morning.          Marland Kitchen omeprazole (PRILOSEC) 20 MG capsule   Oral   Take 1 capsule (20 mg total) by mouth daily.   30 capsule   0   . ondansetron (ZOFRAN ODT) 8 MG disintegrating tablet      8mg  ODT q4 hours prn nausea   8 tablet   0   . oxyCODONE-acetaminophen (PERCOCET/ROXICET) 5-325 MG per tablet   Oral   Take 2 tablets by mouth every 4 (four) hours as needed for severe pain.   6 tablet   0    BP 122/68  Pulse 97  Temp(Src) 97.5 F (36.4 C) (Oral)  Resp 13  SpO2 94% Physical Exam  Nursing note and vitals reviewed. Constitutional: He is oriented to person, place, and time. He appears well-developed and well-nourished. He appears distressed.  Awake, alert, distressed Obese  HENT:  Head: Normocephalic and atraumatic.  Right Ear: External ear normal.  Left Ear: External ear normal.  Mouth/Throat: Oropharynx is clear and moist. No  oropharyngeal exudate.  Eyes: Conjunctivae and EOM are normal. Pupils are equal, round, and reactive to light. No scleral icterus.  Neck: Normal range of motion. Neck supple.  Cardiovascular: Normal rate, regular rhythm, normal heart sounds and intact distal pulses.   No murmur heard. Pulmonary/Chest: Breath sounds normal. Tachypnea noted. No respiratory distress. He has no decreased breath sounds. He has no wheezes. He has no rhonchi. He has no rales. He exhibits no tenderness.  Patient to Clear and equal breath sounds  Abdominal:  Soft. Bowel sounds are normal. He exhibits no distension and no mass. There is generalized tenderness. There is no rebound and no guarding.  Obese abdomen with significant generalized tenderness worse in the epigastrium  Musculoskeletal: Normal range of motion. He exhibits no edema.  Lymphadenopathy:    He has no cervical adenopathy.  Neurological: He is alert and oriented to person, place, and time. He exhibits normal muscle tone. Coordination normal.  Speech is clear and goal oriented Moves extremities without ataxia  Skin: Skin is warm and dry. No rash noted. He is not diaphoretic. No erythema.  Psychiatric: He has a normal mood and affect. His behavior is normal.    ED Course  Procedures (including critical care time) Labs Review Labs Reviewed  CBC - Abnormal; Notable for the following:    Hemoglobin 12.8 (*)    All other components within normal limits  BASIC METABOLIC PANEL - Abnormal; Notable for the following:    Glucose, Bld 139 (*)    BUN 25 (*)    GFR calc non Af Amer 87 (*)    All other components within normal limits  HEPATIC FUNCTION PANEL  LIPASE, BLOOD  APTT  PROTIME-INR  TROPONIN I  TROPONIN I  POCT I-STAT TROPONIN I   Imaging Review Ct Abdomen Pelvis W Contrast  01/25/2013   CLINICAL DATA:  Abdominal pain, nausea, vomiting, history atrial fibrillation  EXAM: CT ABDOMEN AND PELVIS WITH CONTRAST  TECHNIQUE: Multidetector CT imaging  of the abdomen and pelvis was performed using the standard protocol following bolus administration of intravenous contrast. Sagittal and coronal MPR images reconstructed from axial data set.  CONTRAST:  OMNIPAQUE IOHEXOL 300 MG/ML SOLN Dilute oral contrast.  COMPARISON:  None  FINDINGS: Bibasilar atelectasis.  Minimal atherosclerotic calcification in aorta, iliac and coronary arteries.  Dependent density in gallbladder question calculi or sludge.  Poorly defined low-attenuation focus right lobe liver 11 mm diameter, nonspecific.  Additional tiny hepatic cysts are identified.  Liver, spleen, pancreas, kidneys, and adrenal glands normal appearance.  Normal appendix.  Bladder and ureters unremarkable.  Stomach and bowel loops normal appearance.  No mass, adenopathy, free fluid or inflammatory process.  No hernia or acute osseous findings.  IMPRESSION: Tiny hepatic cysts.  Additional nonspecific 11 mm low-attenuation focus right lobe liver.  Question minimal sludge versus dependent calculi within gallbladder.  No acute intra-abdominal or intrapelvic abnormalities identified.   Electronically Signed   By: Ulyses Southward M.D.   On: 01/25/2013 23:58   Dg Chest Port 1 View  01/25/2013   CLINICAL DATA:  Chest pain.  EXAM: PORTABLE CHEST - 1 VIEW  COMPARISON:  08/26/2012 and 06/14/2012  FINDINGS: Heart size and pulmonary vascularity are normal and the lungs are clear. No osseous abnormality.  IMPRESSION: Normal exam.   Electronically Signed   By: Geanie Cooley M.D.   On: 01/25/2013 21:56    EKG Interpretation    Date/Time:  Thursday January 25 2013 20:55:42 EST Ventricular Rate:  91 PR Interval:    QRS Duration: 102 QT Interval:  378 QTC Calculation: 464 R Axis:   -18 Text Interpretation:  Atrial fibrillation , new since most recent prior ECG Nonspecific ST and T wave abnormality , probably digitalis effect Prolonged QT Abnormal ECG Confirmed by [facility]  MD, MICHEAL ([id number]) on 01/25/2013 11:26:55 PM             MDM   1. Epigastric abdominal pain   2. Chest pain   3. Nausea and vomiting  [name] presents from home with chest pain associated nausea and vomiting. Patient tachypneic with clear and equal breath sounds. Patient denies history of MI, but does have a history of A. fib. He reports he is always in A. Fib.  Will control pain, nausea; obtain labs and image abd.  1:05 AM Hepatic function within normal limits, lipase within normal limits, troponin negative and CBC without leukocytosis. CT abdomen with tiny hepatic cysts and possible minimal sludge versus dependent calculi the gallbladder. No acute intra-abdominal or intrapelvic abnormalities noted. Chest x-ray without evidence of pneumonia, pulmonary edema or pneumothorax.  I personally reviewed the imaging tests through PACS system  I reviewed available ER/hospitalization records through the EMR  Will recycle troponin and if negative discharge home.  2:02 AM Delta troponin negative.  Patient pain is controlled and he has had no further episodes of emesis in the department. Patient is hemodynamically stable and his vitals have remained stable throughout his time here in the department.  Patient is to be discharged with recommendation to follow up with PCP in regards to today's hospital visit. Chest pain is not likely of cardiac or pulmonary etiology d/t presentation, perc negative, VSS, no tracheal deviation, no JVD or new murmur, RRR, breath sounds equal bilaterally, EKG without acute abnormalities, negative troponin, and negative CXR. Pt has been advised start a PPI and return to the ED if CP becomes exertional, associated with diaphoresis or nausea, radiates to left jaw/arm, worsens or becomes concerning in any way. Pt appears reliable for follow up and is agreeable to discharge.   Case has been discussed with and seen by Dr. Oletta Lamas who agrees with the above plan to discharge. \  BP 122/68  Pulse 97  Temp(Src) 97.5 F (36.4  C) (Oral)  Resp 13  SpO2 94%     Dierdre Forth, PA-C 01/26/13 0203

## 2013-01-26 MED ORDER — ONDANSETRON 8 MG PO TBDP: ORAL_TABLET | ORAL | Status: DC

## 2013-01-26 MED ORDER — METOCLOPRAMIDE HCL 5 MG PO TABS
5.0000 mg | ORAL_TABLET | Freq: Once | ORAL | Status: AC
  Administered 2013-01-26: 5 mg via ORAL
  Filled 2013-01-26: qty 1

## 2013-01-26 MED ORDER — KETOROLAC TROMETHAMINE 30 MG/ML IJ SOLN
30.0000 mg | Freq: Once | INTRAMUSCULAR | Status: AC
  Administered 2013-01-26: 30 mg via INTRAVENOUS
  Filled 2013-01-26: qty 1

## 2013-01-26 MED ORDER — OXYCODONE-ACETAMINOPHEN 5-325 MG PO TABS: 2.0000 | ORAL_TABLET | ORAL | Status: DC | PRN

## 2013-01-26 MED ORDER — OMEPRAZOLE 20 MG PO CPDR: 20.0000 mg | DELAYED_RELEASE_CAPSULE | Freq: Every day | ORAL | Status: DC

## 2013-01-26 NOTE — ED Notes (Signed)
Pt advised that because he had a narcotic within 4 hours of discharge, he cannot drive himself home. Pt discharged to waiting room.

## 2013-02-09 ENCOUNTER — Telehealth (HOSPITAL_COMMUNITY): Payer: Self-pay | Admitting: *Deleted

## 2013-02-14 ENCOUNTER — Encounter (HOSPITAL_COMMUNITY): Payer: Self-pay

## 2013-03-01 ENCOUNTER — Telehealth: Payer: Self-pay | Admitting: *Deleted

## 2013-03-01 NOTE — Telephone Encounter (Signed)
[facility] requested a FTF on this patient. His sleep study was done back in August 2014. He has not seen Dr. Claiborne Billings to follow up. Patient saw Dr. Sallyanne Kuster 11/27/12.that note does mention his CPAP therapy but not necessarily compliance. Faxed this note to them in hopes that this will suffice.

## 2013-03-12 ENCOUNTER — Ambulatory Visit: Payer: Self-pay | Admitting: Infectious Disease

## 2013-03-14 ENCOUNTER — Encounter: Payer: Self-pay | Admitting: Infectious Disease

## 2013-03-14 ENCOUNTER — Ambulatory Visit (INDEPENDENT_AMBULATORY_CARE_PROVIDER_SITE_OTHER): Payer: Worker's Compensation | Admitting: Infectious Disease

## 2013-03-14 VITALS — BP 123/79 | HR 99 | Temp 97.7°F | Wt 385.0 lb

## 2013-03-14 DIAGNOSIS — A491 Streptococcal infection, unspecified site: Secondary | ICD-10-CM

## 2013-03-14 DIAGNOSIS — M009 Pyogenic arthritis, unspecified: Secondary | ICD-10-CM

## 2013-03-14 LAB — C-REACTIVE PROTEIN: CRP: 0.5 mg/dL (ref <0.60)

## 2013-03-14 LAB — SEDIMENTATION RATE: Sed Rate: 15 mm/hr (ref 0–16)

## 2013-03-14 NOTE — Progress Notes (Signed)
Subjective:    Patient ID: [name], male    DOB: [birth year], 63 y.o.   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 Patient had been treated for prosthetic joint infection in 2007 with 6 weeks of IV antibiotics with removal of antibiotic spacer and reimplantation of prosthesis.   He was being observed off antibiotics when this April 2010 when he was admited in septic shock due once again to group C streptococci from his knee. He was  sp i and d and removal of prosthetic material. He was continued on parenteral antibiotics througout most of the month of April 2010 and then discharged on an additional 21 d of rocephin 1 g daily   I put him back on oral antibiotics PCN and then amoxicillin when I first saw him because I was concerned by persistence of erythema in the leg and warmth over the joint space. He also still had slightly elevated inflammatory markers.  After I saw him in September of 2010 he actually came off of abx on his own, and I again saw him in August of 2011 and have been following him OFF  antibiotics since.  We had folllowed him closely OFF abx with serial ESR and CRP which were encouraging and he has now in May 2014 undergone TKA with [name].   He continues to do well with barely any knee pain at all. Occasionally has difficulty pivoting on the knee but otherwise no complaints. He did suffer from a episode of what sounds like possible viral gastroenteritis in December.  He also was admitted in the summer in July for atrial fibrillation and is currently on anticoagulation and rate controlling drugs.    He denies fevers, malaise and chills.       Assessment & Plan:  Septic arthritis of knee  The patient has done well off antibiotics we'll recheck inflammatory markers.  And encouraged him to visit with Garrett. Alvan [name] but again I cannot guarantee that he will  not have recurrence of infection. Again the culprit organism appears to been a group C streptococcus.  STREPTOCOCCUS INFECTION CCE & UNS SITE GROUP C See above    [name]  Review of Systems  Constitutional: Negative for fever, chills, diaphoresis, activity change, appetite change, fatigue and unexpected weight change.  HENT: Negative for congestion, rhinorrhea, sinus pressure, sneezing, sore throat and trouble swallowing.   Eyes: Negative for photophobia and visual disturbance.  Respiratory: Negative for cough, chest tightness, shortness of breath, wheezing and stridor.   Cardiovascular: Negative for chest pain, palpitations and leg swelling.  Gastrointestinal: Negative for nausea, vomiting, abdominal pain, diarrhea, constipation, blood in stool, abdominal distention and anal bleeding.  Genitourinary: Negative for dysuria, hematuria, flank pain and difficulty urinating.  Musculoskeletal: Negative for arthralgias, back pain, gait problem, joint swelling and myalgias.  Skin: Negative for color change, pallor, rash and wound.  Neurological: Negative for dizziness, tremors, weakness and light-headedness.  Hematological: Negative for adenopathy. Does not bruise/bleed easily.  Psychiatric/Behavioral: Negative for behavioral problems, confusion, sleep disturbance, dysphoric mood, decreased concentration and agitation.       Objective:   Physical Exam  Constitutional: He is oriented to person, place, and time. He appears well-developed and well-nourished. No distress.  HENT:  Head: Normocephalic and atraumatic.  Mouth/Throat: Oropharynx is clear and moist. No oropharyngeal exudate.  Eyes: Conjunctivae and EOM are normal. No scleral  icterus.  Neck: Normal range of motion. Neck supple. No JVD present.  Cardiovascular: Normal rate, regular rhythm and normal heart sounds.  Exam reveals no gallop and no friction rub.   No murmur heard. Pulmonary/Chest: Effort normal and breath sounds  normal. No respiratory distress. He has no wheezes. He has no rales. He exhibits no tenderness.  Abdominal: He exhibits no distension and no mass. There is no tenderness. There is no rebound and no guarding.  Musculoskeletal: He exhibits no edema and no tenderness.       Left knee: He exhibits no swelling and no effusion. No tenderness found.       Legs: Lymphadenopathy:    He has no cervical adenopathy.  Neurological: He is alert and oriented to person, place, and time. He has normal reflexes. He exhibits normal muscle tone. Coordination normal.  Skin: Skin is warm and dry. He is not diaphoretic. No erythema. No pallor.  Psychiatric: He has a normal mood and affect. His behavior is normal. Judgment and thought content normal.          Assessment & Plan:  Septic arthritis of knee  The patient has done well off antibiotics  >4YEARSs. And now is sp reimplantation of new TKA that does NOT appear clinicaly infected. I get ESR, CRP today but interpret with caution. Make fu appt with me in one year  [name]  Was culprit organism

## 2013-04-11 ENCOUNTER — Encounter: Payer: Self-pay | Admitting: Gastroenterology

## 2013-04-16 ENCOUNTER — Encounter: Payer: Self-pay | Admitting: *Deleted

## 2013-04-17 ENCOUNTER — Encounter: Payer: Self-pay | Admitting: Gastroenterology

## 2013-04-17 ENCOUNTER — Ambulatory Visit (INDEPENDENT_AMBULATORY_CARE_PROVIDER_SITE_OTHER): Payer: Medicare Other | Admitting: Gastroenterology

## 2013-04-17 ENCOUNTER — Other Ambulatory Visit: Payer: Self-pay | Admitting: *Deleted

## 2013-04-17 ENCOUNTER — Telehealth: Payer: Self-pay | Admitting: *Deleted

## 2013-04-17 VITALS — BP 100/60 | HR 80 | Ht 68.0 in | Wt 387.0 lb

## 2013-04-17 DIAGNOSIS — Z1211 Encounter for screening for malignant neoplasm of colon: Secondary | ICD-10-CM

## 2013-04-17 DIAGNOSIS — Z01810 Encounter for preprocedural cardiovascular examination: Secondary | ICD-10-CM | POA: Insufficient documentation

## 2013-04-17 MED ORDER — NA SULFATE-K SULFATE-MG SULF 17.5-3.13-1.6 GM/177ML PO SOLN: ORAL | Status: DC

## 2013-04-17 NOTE — Progress Notes (Signed)
04/17/2013 [name] [id number] 04/19/1950   HISTORY OF PRESENT ILLNESS:  Patient is a pleasant 63 year old male who presents to our office today to schedule a colonoscopy.  He says that he had one 11 years ago, however, we cannot find that report.  He says that it was normal at that time.  Is on Xarelto since July 2014 for atrial fibrillation.  He denies any GI complaints.     Past Medical History  Diagnosis Date  . Complication of anesthesia     PT STATES HARD TO WAKE UP AFTER ONE SUGERY -STATES THE SURGERY TOOK LONGER THAN EXPECTED.  NO PROBLEMS WITH ANY OTHER SURGERY  . Hypothyroidism   . Shortness of breath     WITH EXERTION AND PAIN  . Septic arthritis of knee     LEFT KNEE  . Pain     BACK PAIN - PT ATTRIBUTES TO THE WAY HE WALKS DUE TO LEFT KNEE PROBLEM  . Persistent atrial fibrillation   . Chronic anticoagulation     with Xarelto   Past Surgical History  Procedure Laterality Date  . Right knee arthroscopy  1998  . Left knee arthroscopy  1999  . Left knee surgery upper tibial ostomy    . Replacement left knee  2002  . Replacement right knee  2003  . Revision left knee cap   2004  . Left total knee removal for infection  2006  . Reimplantation left total knee   2006  . Removal left total knee   2008  . Reimplantation left total knee  2008  . 2010 removal of left total knee    . Hydrocelectoy   2012  . Reimplantation of total knee Left 06/23/2012    Procedure: REIMPLANTATION OF LEFT TOTAL KNEE;  Surgeon: Mauri Pole, MD;  Location: [facility];  Service: Orthopedics;  Laterality: Left;  . Tee without cardioversion N/A 08/28/2012    Procedure: TRANSESOPHAGEAL ECHOCARDIOGRAM (TEE);  Surgeon: Sanda Klein, MD;  Location: [place];  Service: Cardiovascular;  Laterality: N/A;  . Cardioversion N/A 08/28/2012    Procedure: CARDIOVERSION;  Surgeon: Sanda Klein, MD;  Location: [place];  Service: Cardiovascular;  Laterality: N/A;  . Cardioversion N/A 09/01/2012    Procedure:  CARDIOVERSION;  Surgeon: Pixie Casino, MD;  Location: [place];  Service: Cardiovascular;  Laterality: N/A;    reports that he has never smoked. He has never used smokeless tobacco. He reports that he drinks alcohol. He reports that he does not use illicit drugs. family history includes Breast cancer in his sister; Cervical cancer in his sister; Diabetes in his sister; Heart disease (age of onset: 39) in his father; Liver cancer in his sister; Pancreatic cancer in his sister. There is no history of Colon cancer, Throat cancer, Stomach cancer, Kidney disease, or Liver disease. Allergies  Allergen Reactions  . Morphine Hives      Outpatient Encounter Prescriptions as of 04/17/2013  Medication Sig  . digoxin (LANOXIN) 0.125 MG tablet Take 1 tablet (0.125 mg total) by mouth daily.  Marland Kitchen diltiazem (CARDIZEM CD) 240 MG 24 hr capsule Take 1 capsule (240 mg total) by mouth daily.  Marland Kitchen levothyroxine (SYNTHROID, LEVOTHROID) 100 MCG tablet Take 100 mcg by mouth daily before breakfast.  . metoprolol tartrate (LOPRESSOR) 50 MG tablet Take 1 tablet (50 mg total) by mouth 2 (two) times daily.  . Multiple Vitamin (MULTIVITAMIN WITH MINERALS) TABS Take 1 tablet by mouth daily.  . Rivaroxaban (XARELTO) 20 MG TABS  tablet Take 20 mg by mouth every morning.   . [DISCONTINUED] omeprazole (PRILOSEC) 20 MG capsule Take 1 capsule (20 mg total) by mouth daily.  . [DISCONTINUED] ondansetron (ZOFRAN ODT) 8 MG disintegrating tablet 8mg  ODT q4 hours prn nausea  . [DISCONTINUED] oxyCODONE-acetaminophen (PERCOCET/ROXICET) 5-325 MG per tablet Take 2 tablets by mouth every 4 (four) hours as needed for severe pain.  . [DISCONTINUED] pantoprazole (PROTONIX) 40 MG tablet Take 1 tablet (40 mg total) by mouth daily at 12 noon.  . [DISCONTINUED] Pyridoxine HCl (VITAMIN B-6 PO) Take 1 tablet by mouth daily.     REVIEW OF SYSTEMS  : All other systems reviewed and negative except where noted in the History of Present  Illness.   PHYSICAL EXAM: BP 100/60  Pulse 80  Ht 5\' 8"  (1.727 m)  Wt 387 lb (175.542 kg)  BMI 58.86 kg/m2 General: Well developed white male in no acute distress Head: Normocephalic and atraumatic Eyes:  Sclerae anicteric, conjunctiva pink. Ears: Normal auditory acuity.  Lungs: Clear throughout to auscultation. Heart: Regular rate and rhythm. Abdomen: Soft, obese, non-distended.  Normal bowel sounds.  Non-tender. Rectal:  Deferred.  Will be done at the time of colonoscopy. Musculoskeletal:  Scars noted on B/L knees from joint replacements Skin: No lesions on visible extremities Extremities:  Swelling noted in B/L LE's. Neurological: Alert oriented x 4, grossly non-focal. Psychological:  Alert and cooperative. Normal mood and affect  ASSESSMENT AND PLAN: -Screening colonoscopy:  Last colonoscopy reportedly 11 years ago, but we cannot find those records.  Will schedule with Dr. Deatra Ina at [facility] hospital due to BMI.  Will contact Dr. Loletha Grayer with [facility] to get approval to hold Xarelto prior to colonoscopy.  The risks, benefits, and alternatives were discussed with the patient and he consents to proceed.  The risks benefits and alternatives to a temporary hold of anti-coagulants/anti-platelets for the procedure were discussed with the patient he consents to proceed  *Just of note, patient has experienced several issues with infections in his prosthetic joints.  He is going to contact his orthopedics, Dr. Alvan Dame, to see if he wants him to have antibiotics prior to the procedure.

## 2013-04-17 NOTE — Patient Instructions (Addendum)
You have been scheduled for a colonoscopy at [facility] with Dr. Kaplan. Please follow written instructions given to you at your visit today.  Please pick up your prep kit at the pharmacy within the next 1-3 days. If you use inhalers (even only as needed), please bring them with you on the day of your procedure. Your physician has requested that you go to www.startemmi.com and enter the access code given to you at your visit today. This web site gives a general overview about your procedure. However, you should still follow specific instructions given to you by our office regarding your preparation for the procedure.  You will be contacted by our office prior to your procedure for directions on holding your Xarelto.  If you do not hear from our office 1 week prior to your scheduled procedure, please call [phone] to discuss.    

## 2013-04-17 NOTE — Telephone Encounter (Signed)
Because of the short action of Xarelto, it is usually held for 24 hours before a minor procedure and 48 hours before a major surgical procedure. If polypectomy is anticipated, 48 hours should be more than sufficient. The drug effect is almost immediate when it is restarted, so timing of anticoagulation resumption depends on the expected risk of bleeding after his procedure. He has not had previous cardioembolic events, so the risk of stopping his anticoagulant for a few days is low. "Bridging" is not necessary. Sanda Klein, MD, [facility] [phone] office [phone] pager ----- Message ----- From: Carlyle Dolly, CMA Sent: 04/17/2013 9:33 AM To: Sanda Klein, MD   I called patient to advise him of Dr. Victorino December advise, for holding his Xarelto. Had to leave a message on voice mail for patient to call back.

## 2013-04-17 NOTE — Telephone Encounter (Signed)
  04/17/2013   RE: [name] DOB: 03-12-1950 MRN: [id number]    Dear Dr Sallyanne Kuster,    We have scheduled the above patient for an endoscopic procedure. Our records show that he is on anticoagulation therapy.   Please advise as to how long the patient may come off his therapy of Xarelto prior to the procedure, which is scheduled for 05-18-2013.  Please fax back/ or route the completed form to [place]    Sincerely,    Hope Pigeon

## 2013-04-17 NOTE — Telephone Encounter (Signed)
Patient called back. I advised patient that Dr. Victorino December advise was for patient to hold his Xarelto 24 hrs prior to procedure. Alonza Bogus, PA-C agreed to 24 hr hold. Patient verbalized understanding.

## 2013-04-18 ENCOUNTER — Telehealth: Payer: Self-pay | Admitting: *Deleted

## 2013-04-18 NOTE — Telephone Encounter (Signed)
Patient called and said Dr. Ricard Dillon office said he does not need to take his oral antibiotic because cleaning out his system with prep will be pointless. I advised Lenise Arena PA-C its ok she just wanted doctor to give ok on antibiotics. I called patient back and gave him Jessica's advise. Patient verbalized understanding.

## 2013-04-18 NOTE — Progress Notes (Signed)
Reviewed and agree with management. Robert D. Kaplan, M.D., FACG  

## 2013-05-10 ENCOUNTER — Telehealth: Payer: Self-pay | Admitting: Gastroenterology

## 2013-05-10 NOTE — Telephone Encounter (Signed)
Pts procedure rescheduled at [facility] to 06/13/13 pt to arrive there at 11am for a 12:30pm appt. Updated instructtions mailed to pt.

## 2013-06-04 ENCOUNTER — Ambulatory Visit (INDEPENDENT_AMBULATORY_CARE_PROVIDER_SITE_OTHER): Payer: Medicare Other | Admitting: Cardiovascular Disease

## 2013-06-04 ENCOUNTER — Encounter: Payer: Self-pay | Admitting: Cardiovascular Disease

## 2013-06-04 VITALS — BP 102/76 | HR 88 | Resp 16 | Ht 68.0 in | Wt 398.1 lb

## 2013-06-04 DIAGNOSIS — I4891 Unspecified atrial fibrillation: Secondary | ICD-10-CM

## 2013-06-04 DIAGNOSIS — I4819 Other persistent atrial fibrillation: Secondary | ICD-10-CM

## 2013-06-04 NOTE — Assessment & Plan Note (Signed)
Tolerating anticoagulants without bleeding complications. No history of stroke or TIA. He has a colonoscopy planned in the near future. Would interrupted the Xarelto 124-48 hours before the procedure. Resume as soon as this is considered safe by his gastroenterologist (this should be immediate if he does not have a polypectomy; delayed for several days if a biopsy is performed).  Rate control is very important since he has had tachycardia-induced cardiomyopathy. Currently NYHA functional class I (limitations are orthopedic, not cardiopulmonary), clinically euvolemic.

## 2013-06-04 NOTE — Patient Instructions (Signed)
Dr. Croitoru recommends that you schedule a follow-up appointment in: ONE YEAR.  CONTINUE SAME MEDICATIONS.   

## 2013-06-04 NOTE — Progress Notes (Signed)
Patient ID: [name], male   DOB: 10-19-1950, 63 y.o.   MRN: [id number]      Reason for office visit Atrial fibrillation  [name] has what appears to be permanent atrial fibrillation. 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 He had complete normalization of left ventricular systolic function following rate control. Attempts at cardioversion were unsuccessful. He became asymptomatic once we achieved good rate control and left ventricular ejection fraction normalized. He does not require treatment with loop diuretics. He is on a novel anticoagulant and requires 3 different agents to achieve good ventricular rate control.Marland Kitchen He remains morbidly obese. He continues to struggle adjusting to CPAP therapy for obstructive sleep apnea.   Allergies  Allergen Reactions  . Morphine Hives    Current Outpatient Prescriptions  Medication Sig Dispense Refill  . digoxin (LANOXIN) 0.125 MG tablet Take 1 tablet (0.125 mg total) by mouth daily.  30 tablet  5  . diltiazem (CARDIZEM CD) 240 MG 24 hr capsule Take 1 capsule (240 mg total) by mouth daily.  30 capsule  11  . levothyroxine (SYNTHROID, LEVOTHROID) 100 MCG tablet Take 100 mcg by mouth daily before breakfast.      . metoprolol tartrate (LOPRESSOR) 50 MG tablet Take 1 tablet (50 mg total) by mouth 2 (two) times daily.  180 tablet  3  . Multiple Vitamin (MULTIVITAMIN WITH MINERALS) TABS Take 1 tablet by mouth daily.      . pantoprazole (PROTONIX) 40 MG tablet Take 40 mg by mouth daily.      . Rivaroxaban (XARELTO) 20 MG TABS tablet Take 20 mg by mouth every morning.        No current facility-administered medications for this visit.    Past Medical History  Diagnosis Date  . Complication of anesthesia     PT STATES HARD TO WAKE UP AFTER ONE SUGERY -STATES THE SURGERY TOOK LONGER THAN EXPECTED.  NO PROBLEMS WITH ANY OTHER SURGERY  . Hypothyroidism   . Shortness of breath     WITH EXERTION  AND PAIN  . Septic arthritis of knee     LEFT KNEE  . Pain     BACK PAIN - PT ATTRIBUTES TO THE WAY HE WALKS DUE TO LEFT KNEE PROBLEM  . Persistent atrial fibrillation   . Chronic anticoagulation     with Xarelto    Past Surgical History  Procedure Laterality Date  . Right knee arthroscopy  1998  . Left knee arthroscopy  1999  . Left knee surgery upper tibial ostomy    . Replacement left knee  2002  . Replacement right knee  2003  . Revision left knee cap   2004  . Left total knee removal for infection  2006  . Reimplantation left total knee   2006  . Removal left total knee   2008  . Reimplantation left total knee  2008  . 2010 removal of left total knee    . Hydrocelectoy   2012  . Reimplantation of total knee Left 06/23/2012    Procedure: REIMPLANTATION OF LEFT TOTAL KNEE;  Surgeon: Mauri Pole, MD;  Location: [facility];  Service: Orthopedics;  Laterality: Left;  . Tee without cardioversion N/A 08/28/2012    Procedure: TRANSESOPHAGEAL ECHOCARDIOGRAM (TEE);  Surgeon: Sanda Klein, MD;  Location: [place];  Service: Cardiovascular;  Laterality: N/A;  . Cardioversion N/A 08/28/2012    Procedure: CARDIOVERSION;  Surgeon: Sanda Klein, MD;  Location: [place];  Service: Cardiovascular;  Laterality:  N/A;  . Cardioversion N/A 09/01/2012    Procedure: CARDIOVERSION;  Surgeon: Pixie Casino, MD;  Location: [facility];  Service: Cardiovascular;  Laterality: N/A;    Family History  Problem Relation Age of Onset  . Pancreatic cancer Sister   . Liver cancer Sister   . Cervical cancer Sister   . Breast cancer Sister   . Colon cancer Neg Hx   . Throat cancer Neg Hx   . Stomach cancer Neg Hx   . Diabetes Sister   . Heart disease Father 30  . Kidney disease Neg Hx   . Liver disease Neg Hx     History   Social History  . Marital Status: Single    Spouse Name: N/A    Number of Children: N/A  . Years of Education: N/A   Occupational History  . Not on file.   Social History Main  Topics  . Smoking status: Never Smoker   . Smokeless tobacco: Never Used  . Alcohol Use: Yes     Comment: 4 drinks per month  . Drug Use: No  . Sexual Activity: Yes    Partners: Female   Other Topics Concern  . Not on file   Social History Narrative  . No narrative on file    Review of systems: The patient specifically denies any chest pain at rest or with exertion, dyspnea at rest, orthopnea, paroxysmal nocturnal dyspnea, syncope, palpitations, focal neurological deficits, intermittent claudication, unexplained weight gain, cough, hemoptysis or wheezing. He has no more than mild ankle and calf edema, usually worse on the left.  The patient also denies abdominal pain, nausea, vomiting, dysphagia, diarrhea, constipation, polyuria, polydipsia, dysuria, hematuria, frequency, urgency, abnormal bleeding or bruising, fever, chills, unexpected weight changes, mood swings, change in skin or hair texture, change in voice quality, auditory or visual problems, allergic reactions or rashes, new musculoskeletal complaints other than usual "aches and pains".   PHYSICAL EXAM BP 102/76  Pulse 88  Resp 16  Ht 5\' 8"  (1.727 m)  Wt 398 lb 1.6 oz (180.577 kg)  BMI 60.54 kg/m2 General: Alert, oriented x3, no distress, morbidly obese  Head: no evidence of trauma, PERRL, EOMI, no exophtalmos or lid lag, no myxedema, no xanthelasma; normal ears, nose and oropharynx  Neck: normal jugular venous pulsations and no hepatojugular reflux; brisk carotid pulses without delay and no carotid bruits  Chest: clear to auscultation, no signs of consolidation by percussion or palpation, normal fremitus, symmetrical and full respiratory excursions  Cardiovascular: normal position and quality of the apical impulse, irregular rhythm, normal first and second heart sounds, no murmurs, rubs or gallops  Abdomen: no tenderness or distention, no masses by palpation, no abnormal pulsatility or arterial bruits, normal bowel sounds,  no hepatosplenomegaly  Extremities: no clubbing, cyanosis or edema; 2+ radial, ulnar and brachial pulses bilaterally; 2+ right femoral, posterior tibial and dorsalis pedis pulses; 2+ left femoral, posterior tibial and dorsalis pedis pulses; no subclavian or femoral bruits; multiple scars of old orthopedic procedures. Extensive surgical scars on both knees Neurological: grossly nonfocal   EKG: Atrial fibrillation, otherwise normal  Lipid Panel     Component Value Date/Time   CHOL 109 08/27/2012 0534   TRIG 65 08/27/2012 0534   HDL 45 08/27/2012 0534   CHOLHDL 2.4 08/27/2012 0534   VLDL 13 08/27/2012 0534   LDLCALC 51 08/27/2012 0534    BMET    Component Value Date/Time   NA 138 01/25/2013 2105   K 4.6 01/25/2013 2105  CL 104 01/25/2013 2105   CO2 25 01/25/2013 2105   GLUCOSE 139* 01/25/2013 2105   BUN 25* 01/25/2013 2105   CREATININE 0.99 01/25/2013 2105   CREATININE 1.43* 10/19/2011 1707   CALCIUM 9.5 01/25/2013 2105   GFRNONAA 87* 01/25/2013 2105   GFRNONAA 53* 10/19/2011 1707   GFRAA >90 01/25/2013 2105   GFRAA 61 10/19/2011 1707     ASSESSMENT AND PLAN Persistent atrial fibrillation Tolerating anticoagulants without bleeding complications. No history of stroke or TIA. He has a colonoscopy planned in the near future. Would interrupted the Xarelto 124-48 hours before the procedure. Resume as soon as this is considered safe by his gastroenterologist (this should be immediate if he does not have a polypectomy; delayed for several days if a biopsy is performed).  Rate control is very important since he has had tachycardia-induced cardiomyopathy. Currently NYHA functional class I (limitations are orthopedic, not cardiopulmonary), clinically euvolemic.   No orders of the defined types were placed in this encounter.   Meds ordered this encounter  Medications  . pantoprazole (PROTONIX) 40 MG tablet    Sig: Take 40 mg by mouth daily.    Zamyah Wiesman  Sanda Klein, MD,  [facility] [phone] office [phone] pager

## 2013-06-13 ENCOUNTER — Encounter (HOSPITAL_COMMUNITY)
Admission: RE | Disposition: A | Discharge status: Home / Self Care | Payer: Self-pay | Source: Ambulatory Visit | Attending: Gastroenterology

## 2013-06-13 ENCOUNTER — Encounter (HOSPITAL_COMMUNITY): Payer: Self-pay | Admitting: *Deleted

## 2013-06-13 ENCOUNTER — Ambulatory Visit (HOSPITAL_COMMUNITY)
Admission: RE | Admit: 2013-06-13 | Discharge: 2013-06-13 | Disposition: A | Discharge status: Home / Self Care | Payer: Medicare Other | Source: Ambulatory Visit | Attending: Gastroenterology | Admitting: Gastroenterology

## 2013-06-13 DIAGNOSIS — K219 Gastro-esophageal reflux disease without esophagitis: Secondary | ICD-10-CM | POA: Insufficient documentation

## 2013-06-13 DIAGNOSIS — Z1211 Encounter for screening for malignant neoplasm of colon: Secondary | ICD-10-CM

## 2013-06-13 DIAGNOSIS — I509 Heart failure, unspecified: Secondary | ICD-10-CM | POA: Insufficient documentation

## 2013-06-13 DIAGNOSIS — I4891 Unspecified atrial fibrillation: Secondary | ICD-10-CM | POA: Insufficient documentation

## 2013-06-13 DIAGNOSIS — Z885 Allergy status to narcotic agent status: Secondary | ICD-10-CM | POA: Insufficient documentation

## 2013-06-13 DIAGNOSIS — E039 Hypothyroidism, unspecified: Secondary | ICD-10-CM | POA: Insufficient documentation

## 2013-06-13 DIAGNOSIS — Z808 Family history of malignant neoplasm of other organs or systems: Secondary | ICD-10-CM | POA: Insufficient documentation

## 2013-06-13 DIAGNOSIS — Z7901 Long term (current) use of anticoagulants: Secondary | ICD-10-CM | POA: Insufficient documentation

## 2013-06-13 DIAGNOSIS — D126 Benign neoplasm of colon, unspecified: Secondary | ICD-10-CM

## 2013-06-13 HISTORY — DX: Gastro-esophageal reflux disease without esophagitis: K21.9

## 2013-06-13 HISTORY — DX: Cardiac arrhythmia, unspecified: I49.9

## 2013-06-13 HISTORY — PX: COLONOSCOPY: SHX5424

## 2013-06-13 HISTORY — DX: Heart failure, unspecified: I50.9

## 2013-06-13 SURGERY — COLONOSCOPY
Anesthesia: Moderate Sedation

## 2013-06-13 MED ORDER — DIPHENHYDRAMINE HCL 50 MG/ML IJ SOLN
INTRAMUSCULAR | Status: AC
  Filled 2013-06-13: qty 1

## 2013-06-13 MED ORDER — MIDAZOLAM HCL 10 MG/2ML IJ SOLN
INTRAMUSCULAR | Status: AC
  Filled 2013-06-13: qty 4

## 2013-06-13 MED ORDER — FENTANYL CITRATE 0.05 MG/ML IJ SOLN
INTRAMUSCULAR | Status: DC | PRN
  Administered 2013-06-13 (×3): 25 ug via INTRAVENOUS

## 2013-06-13 MED ORDER — SODIUM CHLORIDE 0.9 % IV SOLN: INTRAVENOUS | Status: DC

## 2013-06-13 MED ORDER — SODIUM CHLORIDE 0.9 % IV SOLN
1.5000 g | Freq: Once | INTRAVENOUS | Status: AC
  Administered 2013-06-13: 1.5 g via INTRAVENOUS
  Filled 2013-06-13: qty 1.5

## 2013-06-13 MED ORDER — FENTANYL CITRATE 0.05 MG/ML IJ SOLN
INTRAMUSCULAR | Status: AC
  Filled 2013-06-13: qty 4

## 2013-06-13 MED ORDER — MIDAZOLAM HCL 10 MG/2ML IJ SOLN
INTRAMUSCULAR | Status: DC | PRN
  Administered 2013-06-13: 2 mg via INTRAVENOUS
  Administered 2013-06-13: 1 mg via INTRAVENOUS
  Administered 2013-06-13: 2 mg via INTRAVENOUS

## 2013-06-13 NOTE — Op Note (Signed)
[facility] [place], [id number]   COLONOSCOPY PROCEDURE REPORT  PATIENT: [name], [name].  MR#: [id number] BIRTHDATE: 02/06/1951 , 63  yrs. old GENDER: Male ENDOSCOPIST: Inda Castle, MD REFERRED BY: PROCEDURE DATE:  06/13/2013 PROCEDURE:   Colonoscopy with snare polypectomy ASA CLASS:   Class II INDICATIONS:Average risk patient for colon cancer. MEDICATIONS: These medications were titrated to patient response per physician's verbal order, Versed 5 mg IV, and Fentanyl 75 mcg IV , unasyn 1.5gmIV  DESCRIPTION OF PROCEDURE:   After the risks benefits and alternatives of the procedure were thoroughly explained, informed consent was obtained.  A digital rectal exam revealed no abnormalities of the rectum.   The     endoscope was introduced through the anus and advanced to the cecum, which was identified by both the appendix and ileocecal valve. No adverse events experienced.   The quality of the prep was Suprep adequate  The instrument was then slowly withdrawn as the colon was fully examined.      COLON FINDINGS: A sessile polyp measuring 4 mm  in size was found at the cecum.  A polypectomy was performed with a cold snare.  polyp was retrieved and submitted to pathology.  In the distal transverse colon there were 3 3 mm polyps which were removed with cold polypectomy snare and submitted to pathology.  the remainder the exam is normal.  retroflexed view of the rectum revealed no abnormalities.    The time to cecum=minutes 0 seconds.  Withdrawal time=13 minutes 0 seconds.  The scope was withdrawn and the procedure completed. COMPLICATIONS: There were no complications.  ENDOSCOPIC IMPRESSION: colon polyps-removed  RECOMMENDATIONS: If the polyp(s) removed today are proven to be adenomatous (pre-cancerous) polyps, you will need a colonoscopy in 3 years. Otherwise you should continue to follow colorectal cancer screening guidelines for "routine  risk" patients with a colonoscopy in 10 years.  You will receive a letter within 1-2 weeks with the results of your biopsy as well as final recommendations.  Please call my office if you have not received a letter after 3 weeks. Resume xarelto in 3 days   eSigned:  Inda Castle, MD 06/13/2013 1:32 PM   cc:   PATIENT NAME:  [name]. MR#: [id number]

## 2013-06-13 NOTE — H&P (Addendum)
_                                                                                                                History of Present Illness: 63 year old white male here for screening colonoscopy.  Last exam was approximately years ago and apparently was normal.  Patient is on xarelto but this has been held for 2 days.  He has no GI complaints.    Past Medical History  Diagnosis Date  . Complication of anesthesia     PT STATES HARD TO WAKE UP AFTER ONE SUGERY -STATES THE SURGERY TOOK LONGER THAN EXPECTED.  NO PROBLEMS WITH ANY OTHER SURGERY  . Hypothyroidism   . Shortness of breath     WITH EXERTION AND PAIN  . Septic arthritis of knee     LEFT KNEE  . Pain     BACK PAIN - PT ATTRIBUTES TO THE WAY HE WALKS DUE TO LEFT KNEE PROBLEM  . Persistent atrial fibrillation   . Chronic anticoagulation     with Xarelto  . Dysrhythmia     A-fib  . CHF (congestive heart failure)   . GERD (gastroesophageal reflux disease)    Past Surgical History  Procedure Laterality Date  . Right knee arthroscopy  1998  . Left knee arthroscopy  1999  . Left knee surgery upper tibial ostomy    . Replacement left knee  2002  . Replacement right knee  2003  . Revision left knee cap   2004  . Left total knee removal for infection  2006  . Reimplantation left total knee   2006  . Removal left total knee   2008  . Reimplantation left total knee  2008  . 2010 removal of left total knee    . Hydrocelectoy   2012  . Reimplantation of total knee Left 06/23/2012    Procedure: REIMPLANTATION OF LEFT TOTAL KNEE;  Surgeon: Mauri Pole, MD;  Location: [facility];  Service: Orthopedics;  Laterality: Left;  . Tee without cardioversion N/A 08/28/2012    Procedure: TRANSESOPHAGEAL ECHOCARDIOGRAM (TEE);  Surgeon: Sanda Klein, MD;  Location: [facility];  Service: Cardiovascular;  Laterality: N/A;  . Cardioversion N/A 08/28/2012    Procedure: CARDIOVERSION;  Surgeon: Sanda Klein, MD;  Location: [facility];   Service: Cardiovascular;  Laterality: N/A;  . Cardioversion N/A 09/01/2012    Procedure: CARDIOVERSION;  Surgeon: Pixie Casino, MD;  Location: [facility];  Service: Cardiovascular;  Laterality: N/A;  . Tonsillectomy     family history includes Breast cancer in his sister; Cervical cancer in his sister; Diabetes in his sister; Heart disease (age of onset: 27) in his father; Liver cancer in his sister; Pancreatic cancer in his sister. There is no history of Colon cancer, Throat cancer, Stomach cancer, Kidney disease, or Liver disease. No current facility-administered medications for this encounter.   Allergies as of 04/17/2013 - Review Complete 04/17/2013  Allergen Reaction Noted  .  Morphine Hives 08/19/2008    reports that he has never smoked. He has never used smokeless tobacco. He reports that he drinks alcohol. He reports that he does not use illicit drugs.     Review of Systems: Pertinent positive and negative review of systems were noted in the above HPI section. All other review of systems were otherwise negative.  Vital signs were reviewed in today's medical record Physical Exam: General: Well developed , well nourished, no acute distress Skin: anicteric Head: Normocephalic and atraumatic Eyes:  sclerae anicteric, EOMI Ears: Normal auditory acuity Mouth: No deformity or lesions Neck: Supple, no masses or thyromegaly Lungs: Clear throughout to auscultation Heart: Regular rate and rhythm; no murmurs, rubs or bruits Abdomen: Soft, non tender and non distended. No masses, hepatosplenomegaly or hernias noted. Normal Bowel sounds Rectal:deferred Musculoskeletal: Symmetrical with no gross deformities  Skin: No lesions on visible extremities Pulses:  Normal pulses noted Extremities: No clubbing, cyanosis, edema or deformities noted Neurological: Alert oriented x 4, grossly nonfocal Cervical Nodes:  No significant cervical adenopathy Inguinal Nodes: No significant inguinal  adenopathy Psychological:  Alert and cooperative. Normal mood and affect  Impression-screening colonoscopy for colorectal cancer screening.  Because patient has a history of infected prosthesis I will cover him with Unasyn 1.5 g

## 2013-06-13 NOTE — Discharge Instructions (Signed)
Colonoscopy, Care After Refer to this sheet in the next few weeks. These instructions provide you with information on caring for yourself after your procedure. Your health care provider may also give you more specific instructions. Your treatment has been planned according to current medical practices, but problems sometimes occur. Call your health care provider if you have any problems or questions after your procedure. WHAT TO EXPECT AFTER THE PROCEDURE  After your procedure, it is typical to have the following:  A small amount of blood in your stool.  Moderate amounts of gas and mild abdominal cramping or bloating. HOME CARE INSTRUCTIONS  Do not drive, operate machinery, or sign important documents for 24 hours.  You may shower and resume your regular physical activities, but move at a slower pace for the first 24 hours.  Take frequent rest periods for the first 24 hours.  Walk around or put a warm pack on your abdomen to help reduce abdominal cramping and bloating.  Drink enough fluids to keep your urine clear or pale yellow.  You may resume your normal diet as instructed by your health care provider. Avoid heavy or fried foods that are hard to digest.  Avoid drinking alcohol for 24 hours or as instructed by your health care provider.  Only take over-the-counter or prescription medicines as directed by your health care provider.  If a tissue sample (biopsy) was taken during your procedure:  Do not take aspirin or blood thinners for 7 days, or as instructed by your health care provider.  Do not drink alcohol for 7 days, or as instructed by your health care provider.  Eat soft foods for the first 24 hours. SEEK MEDICAL CARE IF: You have persistent spotting of blood in your stool 2 3 days after the procedure. SEEK IMMEDIATE MEDICAL CARE IF:  You have more than a small spotting of blood in your stool.  You pass large blood clots in your stool.  Your abdomen is swollen  (distended).  You have nausea or vomiting.  You have a fever.  You have increasing abdominal pain that is not relieved with medicine. Document Released: 09/09/2003 Document Revised: 11/15/2012 Document Reviewed: 10/02/2012 [facility] Patient Information 2014 [place].  Conscious Sedation, Adult, Care After Refer to this sheet in the next few weeks. These instructions provide you with information on caring for yourself after your procedure. Your health care provider may also give you more specific instructions. Your treatment has been planned according to current medical practices, but problems sometimes occur. Call your health care provider if you have any problems or questions after your procedure. WHAT TO EXPECT AFTER THE PROCEDURE  After your procedure:  You may feel sleepy, clumsy, and have poor balance for several hours.  Vomiting may occur if you eat too soon after the procedure. HOME CARE INSTRUCTIONS  Do not participate in any activities where you could become injured for at least 24 hours. Do not:  Drive.  Swim.  Ride a bicycle.  Operate heavy machinery.  Cook.  Use power tools.  Climb ladders.  Work from a high place.  Do not make important decisions or sign legal documents until you are improved.  If you vomit, drink water, juice, or soup when you can drink without vomiting. Make sure you have little or no nausea before eating solid foods.  Only take over-the-counter or prescription medicines for pain, discomfort, or fever as directed by your health care provider.  Make sure you and your family fully understand everything  about the medicines given to you, including what side effects may occur.  You should not drink alcohol, take sleeping pills, or take medicines that cause drowsiness for at least 24 hours.  If you smoke, do not smoke without supervision.  If you are feeling better, you may resume normal activities 24 hours after you were  sedated.  Keep all appointments with your health care provider. SEEK MEDICAL CARE IF:  Your skin is pale or bluish in color.  You continue to feel nauseous or vomit.  Your pain is getting worse and is not helped by medicine.  You have bleeding or swelling.  You are still sleepy or feeling clumsy after 24 hours. SEEK IMMEDIATE MEDICAL CARE IF:  You develop a rash.  You have difficulty breathing.  You develop any type of allergic problem.  You have a fever. MAKE SURE YOU:  Understand these instructions.  Will watch your condition.  Will get help right away if you are not doing well or get worse. Document Released: 11/15/2012 Document Reviewed: 09/01/2012 [facility] Patient Information 2014 [place], [place].

## 2013-06-14 ENCOUNTER — Telehealth (HOSPITAL_COMMUNITY): Payer: Self-pay | Admitting: *Deleted

## 2013-06-14 ENCOUNTER — Encounter (HOSPITAL_COMMUNITY): Payer: Self-pay | Admitting: Gastroenterology

## 2013-06-15 ENCOUNTER — Encounter: Payer: Self-pay | Admitting: Gastroenterology

## 2013-06-18 ENCOUNTER — Other Ambulatory Visit: Payer: Self-pay

## 2013-06-18 DIAGNOSIS — I480 Paroxysmal atrial fibrillation: Secondary | ICD-10-CM

## 2013-06-18 NOTE — Telephone Encounter (Signed)
Received request for refill. 

## 2013-07-24 ENCOUNTER — Other Ambulatory Visit (HOSPITAL_COMMUNITY): Payer: Self-pay | Admitting: Cardiology

## 2013-07-24 NOTE — Telephone Encounter (Signed)
Rx was sent to pharmacy electronically. 

## 2013-08-22 ENCOUNTER — Other Ambulatory Visit: Payer: Worker's Compensation

## 2013-08-22 ENCOUNTER — Telehealth: Payer: Self-pay | Admitting: *Deleted

## 2013-08-22 DIAGNOSIS — M25562 Pain in left knee: Secondary | ICD-10-CM

## 2013-08-22 LAB — CBC WITH DIFFERENTIAL/PLATELET
BASOS ABS: 0 10*3/uL (ref 0.0–0.1)
Basophils Relative: 1 % (ref 0–1)
EOS ABS: 0.1 10*3/uL (ref 0.0–0.7)
EOS PCT: 2 % (ref 0–5)
HEMATOCRIT: 37.3 % — AB (ref 39.0–52.0)
Hemoglobin: 12.8 g/dL — ABNORMAL LOW (ref 13.0–17.0)
Lymphocytes Relative: 31 % (ref 12–46)
Lymphs Abs: 1.3 10*3/uL (ref 0.7–4.0)
MCH: 30.7 pg (ref 26.0–34.0)
MCHC: 34.3 g/dL (ref 30.0–36.0)
MCV: 89.4 fL (ref 78.0–100.0)
MONO ABS: 0.5 10*3/uL (ref 0.1–1.0)
Monocytes Relative: 11 % (ref 3–12)
Neutro Abs: 2.3 10*3/uL (ref 1.7–7.7)
Neutrophils Relative %: 55 % (ref 43–77)
Platelets: 213 10*3/uL (ref 150–400)
RBC: 4.17 MIL/uL — ABNORMAL LOW (ref 4.22–5.81)
RDW: 14.9 % (ref 11.5–15.5)
WBC: 4.2 10*3/uL (ref 4.0–10.5)

## 2013-08-22 LAB — C-REACTIVE PROTEIN: CRP: 0.9 mg/dL — ABNORMAL HIGH (ref <0.60)

## 2013-08-22 NOTE — Telephone Encounter (Signed)
Pt having problems with his knee again, pain in knee especially at night.  He is worried that the infection is coming back.  Requesting lab work be ordered to check on infection markers.  RN spoke with Dr. Retia Passe.  Shared pt's concerns.  Obtained verbal order for Sed rate, CRP and CBC w/ diff to be drawn.

## 2013-08-22 NOTE — Telephone Encounter (Signed)
Patient would like his test results from today faxed to Dr. Alvan Dame.  RN set reminder to do so when results are complete. Landis Gandy, RN

## 2013-08-23 LAB — SEDIMENTATION RATE: Sed Rate: 18 mm/h — ABNORMAL HIGH (ref 0–16)

## 2013-08-23 NOTE — Telephone Encounter (Signed)
Faxed this morning via [employer] routing. Landis Gandy, RN

## 2013-08-29 ENCOUNTER — Telehealth: Payer: Self-pay | Admitting: *Deleted

## 2013-08-29 NOTE — Telephone Encounter (Signed)
i DONT THINK SO unless he is having significant pain. His ESR is barely out of normal range and lab just redefined ULN

## 2013-08-29 NOTE — Telephone Encounter (Signed)
Patient called for his lab results.  Please advise if he should be seen. Landis Gandy, RN

## 2013-08-30 NOTE — Telephone Encounter (Signed)
Patient called stating he is having significant pain in his knee with fluid running down his leg. He has an appointment with the surgeon on 09/14/13, but when he made this appointment he was not having drainage. Advised patient to call the surgeon in the morning and request to be worked in. Also scheduled him an appointment with the first available clinic MD, Dr. Linus Salmons on 09/17/13. Myrtis Hopping

## 2013-08-30 NOTE — Telephone Encounter (Signed)
He is having some pain, is concerned that the infection may be returning.  He asked that his labs be sent to his surgeon and PCP - I did that.

## 2013-08-30 NOTE — Telephone Encounter (Signed)
That sounds reasonable. Getting an aspirate from his knee for cell count and differential and culture IS BEST thing we can do to rule in or out infectino and the surgeon is going to be best person to do that

## 2013-09-17 ENCOUNTER — Ambulatory Visit: Payer: Self-pay | Admitting: Internal Medicine

## 2013-09-25 ENCOUNTER — Other Ambulatory Visit: Payer: Self-pay | Admitting: *Deleted

## 2013-09-25 ENCOUNTER — Other Ambulatory Visit: Payer: Self-pay | Admitting: Cardiovascular Disease

## 2013-09-25 ENCOUNTER — Other Ambulatory Visit (HOSPITAL_COMMUNITY): Payer: Self-pay | Admitting: Cardiology

## 2013-09-25 MED ORDER — DILTIAZEM HCL ER COATED BEADS 240 MG PO CP24: 240.0000 mg | ORAL_CAPSULE | Freq: Every day | ORAL | Status: DC

## 2013-09-25 NOTE — Telephone Encounter (Signed)
Rx was sent to pharmacy electronically. 

## 2013-11-01 ENCOUNTER — Telehealth: Payer: Self-pay | Admitting: Cardiovascular Disease

## 2013-11-01 NOTE — Telephone Encounter (Signed)
Informed patient of Dr Sallyanne Kuster comments  Patient states he will have to think about this and call back RN inform that samples were still available for him to pick up.

## 2013-11-01 NOTE — Telephone Encounter (Signed)
The only cheaper alternative is switching to warfarin, which will require at least monthly blood testing in the Coumadin clinic. It would be dangerous to switch back and forth every year for the donut hole problem. If he wants to permanently switch to warfarin, will ask Erasmo Downer to help with the transition.

## 2013-11-01 NOTE — Telephone Encounter (Signed)
Spoke to patient   a month supply cost him $151 Patient states he contacted Xarelto- received free month access #. - pharmacy informed him he could not use it. He purchased the medications.  patient states he can not afford this each month.  RN informed him samples were available 15 days worth - - patient states he will pick it up in about a week ,he will be going out of town next week. Patient aware will defer to Dr Sallyanne Kuster

## 2013-11-01 NOTE — Telephone Encounter (Signed)
Mr. [name] called in stating that he has reached his donut hole with the Xarelto and would like to know if there is another medication that he could take that is cheaper. He said that if that if there is an alternative please call it in to the [facility].    Thanks

## 2013-12-03 ENCOUNTER — Telehealth: Payer: Self-pay | Admitting: Cardiovascular Disease

## 2013-12-03 NOTE — Telephone Encounter (Signed)
Pt need an updated prescription for his C-Pap supplies.

## 2013-12-03 NOTE — Telephone Encounter (Signed)
FORWARD TO [place] AND WANDA WADDELL CMA

## 2013-12-04 ENCOUNTER — Telehealth: Payer: Self-pay | Admitting: *Deleted

## 2013-12-04 NOTE — Telephone Encounter (Signed)
Returned CPAP supply order to [facility] @ [phone].

## 2013-12-06 NOTE — Telephone Encounter (Signed)
Orders signed by Dr. Claiborne Billings and faxed to [facility].

## 2013-12-31 ENCOUNTER — Other Ambulatory Visit: Payer: Self-pay

## 2013-12-31 DIAGNOSIS — I480 Paroxysmal atrial fibrillation: Secondary | ICD-10-CM

## 2013-12-31 MED ORDER — METOPROLOL TARTRATE 50 MG PO TABS: 50.0000 mg | ORAL_TABLET | Freq: Two times a day (BID) | ORAL | Status: DC

## 2014-02-21 ENCOUNTER — Encounter (HOSPITAL_COMMUNITY): Payer: Self-pay | Admitting: Internal Medicine

## 2014-04-01 ENCOUNTER — Other Ambulatory Visit: Payer: Self-pay | Admitting: Cardiovascular Disease

## 2014-06-07 ENCOUNTER — Encounter: Payer: Self-pay | Admitting: Cardiovascular Disease

## 2014-06-07 ENCOUNTER — Ambulatory Visit (INDEPENDENT_AMBULATORY_CARE_PROVIDER_SITE_OTHER): Payer: Medicare Other | Admitting: Cardiovascular Disease

## 2014-06-07 VITALS — BP 100/70 | HR 89 | Resp 20 | Ht 68.0 in | Wt 381.9 lb

## 2014-06-07 DIAGNOSIS — I4819 Other persistent atrial fibrillation: Secondary | ICD-10-CM

## 2014-06-07 DIAGNOSIS — I481 Persistent atrial fibrillation: Secondary | ICD-10-CM

## 2014-06-07 DIAGNOSIS — G4733 Obstructive sleep apnea (adult) (pediatric): Secondary | ICD-10-CM | POA: Diagnosis not present

## 2014-06-07 DIAGNOSIS — Z86718 Personal history of other venous thrombosis and embolism: Secondary | ICD-10-CM | POA: Diagnosis not present

## 2014-06-07 DIAGNOSIS — Z79899 Other long term (current) drug therapy: Secondary | ICD-10-CM

## 2014-06-07 DIAGNOSIS — Z9989 Dependence on other enabling machines and devices: Secondary | ICD-10-CM

## 2014-06-07 MED ORDER — RIVAROXABAN 20 MG PO TABS: 20.0000 mg | ORAL_TABLET | Freq: Every day | ORAL | Status: DC

## 2014-06-07 NOTE — Progress Notes (Signed)
Patient ID: [name], male   DOB: [birth year], 63 y.o.   MRN: [id number]      Cardiology Office Note   Date:  06/07/2014   ID:  [name], DOB 08-04-1950, MRN [id number]  PCP:  Phineas Inches, MD  Cardiologist:   Sanda Klein, MD   Chief Complaint  Patient presents with  . Annual Exam      History of Present Illness: [name] is a 64 y.o. male who presents for permanent atrial fibrillation and history of recurrent venous thromboembolic events. He is taking anticoagulation with Xarelto for both these disorders. He has obstructive sleep apnea on CPAP.  He was unaware of atrial fibrillation when initially diagnosed about a year ago. Rate control was poor and he had left ventricular systolic dysfunction. Cardioversion was unsuccessful. After achieving good rate control left ventricular systolic function normalized, consistent with tachycardia related cardiomyopathy. He does not require diuretic therapy. He does need 3 different pharmacological agents to maintain normal ventricular rates. The use of digoxin was necessary due to relatively low blood pressure.  He describes streaks of blood when he blows his nose in the morning, but no other bleeding complications on Xarelto. He has chronic lower extremity edema that if anything is a little better than usual. He walks on a daily basis. He can feel his heart rate accelerate when he walks but denies shortness of breath, chest pain, syncope or other worrisome symptoms.    Past Medical History  Diagnosis Date  . Complication of anesthesia     PT STATES HARD TO WAKE UP AFTER ONE SUGERY -STATES THE SURGERY TOOK LONGER THAN EXPECTED.  NO PROBLEMS WITH ANY OTHER SURGERY  . Hypothyroidism   . Shortness of breath     WITH EXERTION AND PAIN  . Septic arthritis of knee     LEFT KNEE  . Pain     BACK PAIN - PT ATTRIBUTES TO THE WAY HE WALKS DUE TO LEFT KNEE PROBLEM  . Persistent atrial fibrillation   . Chronic anticoagulation     with  Xarelto  . Dysrhythmia     A-fib  . CHF (congestive heart failure)   . GERD (gastroesophageal reflux disease)     Past Surgical History  Procedure Laterality Date  . Right knee arthroscopy  1998  . Left knee arthroscopy  1999  . Left knee surgery upper tibial ostomy    . Replacement left knee  2002  . Replacement right knee  2003  . Revision left knee cap   2004  . Left total knee removal for infection  2006  . Reimplantation left total knee   2006  . Removal left total knee   2008  . Reimplantation left total knee  2008  . 2010 removal of left total knee    . Hydrocelectoy   2012  . Reimplantation of total knee Left 06/23/2012    Procedure: REIMPLANTATION OF LEFT TOTAL KNEE;  Surgeon: Mauri Pole, MD;  Location: [facility];  Service: Orthopedics;  Laterality: Left;  . Tee without cardioversion N/A 08/28/2012    Procedure: TRANSESOPHAGEAL ECHOCARDIOGRAM (TEE);  Surgeon: Sanda Klein, MD;  Location: [facility];  Service: Cardiovascular;  Laterality: N/A;  . Cardioversion N/A 08/28/2012    Procedure: CARDIOVERSION;  Surgeon: Sanda Klein, MD;  Location: [facility];  Service: Cardiovascular;  Laterality: N/A;  . Cardioversion N/A 09/01/2012    Procedure: CARDIOVERSION;  Surgeon: Pixie Casino, MD;  Location: [facility];  Service: Cardiovascular;  Laterality:  N/A;  . Tonsillectomy    . Colonoscopy N/A 06/13/2013    Procedure: COLONOSCOPY;  Surgeon: Inda Castle, MD;  Location: [facility];  Service: Endoscopy;  Laterality: N/A;     Current Outpatient Prescriptions  Medication Sig Dispense Refill  . DIGOX 125 MCG tablet TAKE 1 TABLET BY MOUTH ONCE DAILY 30 tablet 8  . diltiazem (CARDIZEM CD) 240 MG 24 hr capsule Take 1 capsule (240 mg total) by mouth daily. 30 capsule 8  . levothyroxine (SYNTHROID, LEVOTHROID) 100 MCG tablet Take 100 mcg by mouth daily before breakfast.    . metoprolol (LOPRESSOR) 50 MG tablet Take 1 tablet (50 mg total) by mouth 2 (two) times daily. 180 tablet 3  . Multiple  Vitamin (MULTIVITAMIN WITH MINERALS) TABS Take 1 tablet by mouth daily.    . pantoprazole (PROTONIX) 40 MG tablet TAKE 1 TABLET BY MOUTH DAILY AT 12 NOON 30 tablet 10  . pyridoxine (B-6) 100 MG tablet Take 100 mg by mouth daily.    . Rivaroxaban (XARELTO) 20 MG TABS tablet Take 20 mg by mouth every morning.     Alveda Reasons 20 MG TABS tablet TAKE 1 TABLET BY MOUTH DAILY WITH BREAKFAST 30 tablet 3   No current facility-administered medications for this visit.    Allergies:   Morphine    Social History:  The patient  reports that he has never smoked. He has never used smokeless tobacco. He reports that he drinks alcohol. He reports that he does not use illicit drugs.   Family History:  The patient's family history includes Breast cancer in his sister; Cervical cancer in his sister; Diabetes in his sister; Heart disease (age of onset: 75) in his father; Liver cancer in his sister; Pancreatic cancer in his sister. There is no history of Colon cancer, Throat cancer, Stomach cancer, Kidney disease, or Liver disease.    ROS:  Please see the history of present illness.    Otherwise, review of systems positive for none.   All other systems are reviewed and negative.    PHYSICAL EXAM: VS:  BP 100/70 mmHg  Pulse 89  Resp 20  Ht 5\' 8"  (1.727 m)  Wt 381 lb 14.4 oz (173.229 kg)  BMI 58.08 kg/m2 , BMI Body mass index is 58.08 kg/(m^2).  General: Alert, oriented x3, no distress, morbidly obese  Head: no evidence of trauma, PERRL, EOMI, no exophtalmos or lid lag, no myxedema, no xanthelasma; normal ears, nose and oropharynx  Neck: normal jugular venous pulsations and no hepatojugular reflux; brisk carotid pulses without delay and no carotid bruits  Chest: clear to auscultation, no signs of consolidation by percussion or palpation, normal fremitus, symmetrical and full respiratory excursions  Cardiovascular: normal position and quality of the apical impulse, irregular rhythm, normal first and second  heart sounds, no murmurs, rubs or gallops  Abdomen: no tenderness or distention, no masses by palpation, no abnormal pulsatility or arterial bruits, normal bowel sounds, no hepatosplenomegaly  Extremities: no clubbing, cyanosis or edema; 2+ radial, ulnar and brachial pulses bilaterally; 2+ right femoral, posterior tibial and dorsalis pedis pulses; 2+ left femoral, posterior tibial and dorsalis pedis pulses; no subclavian or femoral bruits; multiple scars of old orthopedic procedures. Extensive surgical scars on both knees Neurological: grossly nonfocal  Psych: euthymic mood, full affect   EKG:  EKG is ordered today. The ekg ordered today demonstrates atrial fibrillation with controlled ventricular rate, a couple of wide complex beats most likely represent aberrant conduction, but cannot exclude PVCs,  digoxin-related ST segment changes, no ischemic changes   Recent Labs: 08/22/2013: Hemoglobin 12.8*; Platelets 213    Lipid Panel    Component Value Date/Time   CHOL 109 08/27/2012 0534   TRIG 65 08/27/2012 0534   HDL 45 08/27/2012 0534   CHOLHDL 2.4 08/27/2012 0534   VLDL 13 08/27/2012 0534   LDLCALC 51 08/27/2012 0534      Wt Readings from Last 3 Encounters:  06/07/14 381 lb 14.4 oz (173.229 kg)  06/04/13 398 lb 1.6 oz (180.577 kg)  04/17/13 387 lb (175.542 kg)     ASSESSMENT AND PLAN:  1. Permanent atrial fibrillation with adequate rate control and anticoagulation, previously complicated by tachycardia related cardiomyopathy, now resolved 2. Selective sleep apnea on CPAP 3. Super obesity 4. 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 History of multiple knee surgery procedures   Current medicines are reviewed at length with the patient today.  The patient has concerns regarding medicines. He worries about mild nasal bleeding with Xarelto. Discussed the fact that such nuisance bleeding is likely to continue, but is  not threatening. Risk of systemic and pulmonary embolic complications is higher than bleeding risk.  He reports probably having lab tests with his primary care physician a month ago, but is not sure. Reminded him of the importance of yearly check some liver function and electrolytes. If the lab tests have not been done we will order them.  The following changes have been made:  no change  Labs/ tests ordered today include:  Orders Placed This Encounter  Procedures  . EKG 12-Lead   Patient Instructions  Your physician recommends that you return for lab work at your convenience.  Dr Sallyanne Kuster recommends that you schedule a follow-up appointment in 1 year. You will receive a reminder letter in the mail two months in advance. If you don't receive a letter, please call our office to schedule the follow-up appointment.     Mikael Spray, MD  06/07/2014 8:19 AM    Sanda Klein, MD, [facility] [phone] office [phone] pager

## 2014-06-07 NOTE — Patient Instructions (Addendum)
Your physician recommends that you return for lab work at your convenience.  Dr Sallyanne Kuster recommends that you schedule a follow-up appointment in 1 year. You will receive a reminder letter in the mail two months in advance. If you don't receive a letter, please call our office to schedule the follow-up appointment.  Medication samples have been provided to the patient. Drug name: Xarelto 20 mg Qty: 15 tabs LOT: [id number] Exp.Date: 07/2016 Samples left at front desk for patient pick-up. Patient notified.  [place], [place] 10:39 AM 06/07/2014

## 2014-06-28 ENCOUNTER — Other Ambulatory Visit: Payer: Self-pay | Admitting: Cardiovascular Disease

## 2014-08-23 ENCOUNTER — Other Ambulatory Visit: Payer: Self-pay | Admitting: Cardiovascular Disease

## 2014-09-09 ENCOUNTER — Telehealth: Payer: Self-pay | Admitting: Cardiovascular Disease

## 2014-09-09 ENCOUNTER — Telehealth: Payer: Self-pay | Admitting: Gastroenterology

## 2014-09-09 MED ORDER — HYDROCORTISONE ACETATE 25 MG RE SUPP: 25.0000 mg | Freq: Every day | RECTAL | Status: DC

## 2014-09-09 NOTE — Telephone Encounter (Signed)
Pt states the blood has been on the tissue and on one occasion there was some blood in his underwear. No clots.

## 2014-09-09 NOTE — Telephone Encounter (Signed)
Do you want the pt to take the suppositories BID or just before bedtime. Please clarify.

## 2014-09-09 NOTE — Telephone Encounter (Signed)
Pt states he had colon done in May of 2015. States that for the past several days he has been having rectal bleeding and he is concerned about this. States he has not had a problem with hemorrhoids, pt does take xerelto. Please advise.

## 2014-09-09 NOTE — Telephone Encounter (Signed)
Spoke with pt and he will take the supp at bedtime and call if bleeding worsens or does not subside. Script sent to pharmacy.

## 2014-09-09 NOTE — Telephone Encounter (Signed)
Complains of bleeding per rectum, off and on, for past 4 weeks. described as spotty. Sometimes is a little when he wipes, sometimes is in his pants.   H/o colon polyps (had some removed a little over a year ago), no hemorrhoids.   Last bleeding occurrence about 8 days ago.   Patient wants to know if he should hold xarelto for a few days (although no bleeding in 8 days) or see GI specialist.   Routed to Erasmo Downer, PharmD for advice

## 2014-09-09 NOTE — Telephone Encounter (Signed)
Every at bedtime on to be adequate

## 2014-09-09 NOTE — Telephone Encounter (Signed)
Blood on the tissue?  Blood in the water?  Clots?  Please elaborate about his bleeding

## 2014-09-09 NOTE — Telephone Encounter (Signed)
Please call,bleeding from his rectum.He wonders if he might need to stop his Xarelto?

## 2014-09-09 NOTE — Telephone Encounter (Signed)
Let's empirically place him on Anusol before meals suppositories for possible hemorrhoidal bleeding.  He should call back if bleeding becomes worse at which point  he have an office visit

## 2014-09-10 NOTE — Telephone Encounter (Signed)
Pt has been advised by GI to use anusol suppositories and contact them if no improvement in symptoms.  LMOM that we are aware of this and if he has any further concerns he can call.

## 2015-01-23 ENCOUNTER — Other Ambulatory Visit: Payer: Self-pay | Admitting: Internal Medicine

## 2015-01-23 ENCOUNTER — Other Ambulatory Visit: Payer: Self-pay | Admitting: Cardiovascular Disease

## 2015-01-23 NOTE — Telephone Encounter (Signed)
Since patient is prn follow up with Dr Rayann Heman, should this be routed to Dr Croitoru's office?

## 2015-01-23 NOTE — Telephone Encounter (Signed)
yes

## 2015-01-24 NOTE — Telephone Encounter (Signed)
Rx(s) sent to pharmacy electronically.  

## 2015-02-27 ENCOUNTER — Other Ambulatory Visit: Payer: Self-pay | Admitting: Cardiovascular Disease

## 2015-02-27 NOTE — Telephone Encounter (Signed)
Rx request sent to pharmacy.  

## 2015-06-26 ENCOUNTER — Other Ambulatory Visit: Payer: Self-pay | Admitting: Cardiovascular Disease

## 2015-06-26 NOTE — Telephone Encounter (Signed)
Rx request sent to pharmacy.  

## 2015-07-23 ENCOUNTER — Encounter: Payer: Self-pay | Admitting: Cardiovascular Disease

## 2015-07-23 ENCOUNTER — Ambulatory Visit (INDEPENDENT_AMBULATORY_CARE_PROVIDER_SITE_OTHER): Payer: Medicare Other | Admitting: Cardiovascular Disease

## 2015-07-23 VITALS — BP 100/70 | HR 74 | Ht 68.0 in | Wt 388.0 lb

## 2015-07-23 DIAGNOSIS — E669 Obesity, unspecified: Secondary | ICD-10-CM

## 2015-07-23 DIAGNOSIS — I481 Persistent atrial fibrillation: Secondary | ICD-10-CM

## 2015-07-23 DIAGNOSIS — Z7901 Long term (current) use of anticoagulants: Secondary | ICD-10-CM

## 2015-07-23 DIAGNOSIS — G4733 Obstructive sleep apnea (adult) (pediatric): Secondary | ICD-10-CM

## 2015-07-23 DIAGNOSIS — I4819 Other persistent atrial fibrillation: Secondary | ICD-10-CM

## 2015-07-23 DIAGNOSIS — Z9989 Dependence on other enabling machines and devices: Secondary | ICD-10-CM

## 2015-07-23 NOTE — Addendum Note (Signed)
Addended by: Derl Barrow on: 07/23/2015 09:34 AM   Modules accepted: Medications

## 2015-07-23 NOTE — Progress Notes (Signed)
Cardiology Office Note    Date:  07/23/2015   ID:  [name], DOB Nov 08, 1950, MRN [id number]  PCP:  Garrett Inches, MD  Cardiologist:   Garrett Klein, MD   Chief Complaint  Patient presents with  . Follow-up  . Atrial Fibrillation    History of Present Illness:  [name] is a 65 y.o. male who presents for permanent atrial fibrillation and history of recurrent venous thromboembolic events. He is taking anticoagulation with Xarelto for both these disorders. He has obstructive sleep apnea on CPAP In the setting of super obesity (BMI 59). Activity is limited by severe orthopedic problems (he has had 12 knee surgeries).  He initially presented with congestive heart failure about 2-3 years ago, secondary to tachycardia related cardiomyopathy. Left ventricular systolic function returned to normal with rate control.  He generally feels well. He remains unaware of palpitations. He has not had any dizziness or syncope and denies dyspnea at rest, although he does have NYHA functional class II exertional dyspnea. He firmly denies orthopnea or PND. He has waxing and waning lower extremity edema, usually more prominent in the left lower extremity where he has had numerous knee surgeries and previous DVT. She takes furosemide as needed on the average 2 or 3 times a week (he does not know the dose of furosemide).  Rate control today appears to be excellent and he has not noticed any fast heartbeats. He has not had any serious bleeding complications while taking Xarelto.  Past Medical History  Diagnosis Date  . Complication of anesthesia     PT STATES HARD TO WAKE UP AFTER ONE SUGERY -STATES THE SURGERY TOOK LONGER THAN EXPECTED.  NO PROBLEMS WITH ANY OTHER SURGERY  . Hypothyroidism   . Shortness of breath     WITH EXERTION AND PAIN  . Septic arthritis of knee (HCC)     LEFT KNEE  . Pain     BACK PAIN - PT ATTRIBUTES TO THE WAY HE WALKS DUE TO LEFT KNEE PROBLEM  . Persistent atrial  fibrillation ([place])   . Chronic anticoagulation     with Xarelto  . Dysrhythmia     A-fib  . CHF (congestive heart failure) ([place])   . GERD (gastroesophageal reflux disease)     Past Surgical History  Procedure Laterality Date  . Right knee arthroscopy  1998  . Left knee arthroscopy  1999  . Left knee surgery upper tibial ostomy    . Replacement left knee  2002  . Replacement right knee  2003  . Revision left knee cap   2004  . Left total knee removal for infection  2006  . Reimplantation left total knee   2006  . Removal left total knee   2008  . Reimplantation left total knee  2008  . 2010 removal of left total knee    . Hydrocelectoy   2012  . Reimplantation of total knee Left 06/23/2012    Procedure: REIMPLANTATION OF LEFT TOTAL KNEE;  Surgeon: Garrett Pole, MD;  Location: [facility];  Service: Orthopedics;  Laterality: Left;  . Tee without cardioversion N/A 08/28/2012    Procedure: TRANSESOPHAGEAL ECHOCARDIOGRAM (TEE);  Surgeon: Garrett Klein, MD;  Location: [facility];  Service: Cardiovascular;  Laterality: N/A;  . Cardioversion N/A 08/28/2012    Procedure: CARDIOVERSION;  Surgeon: Garrett Klein, MD;  Location: [facility];  Service: Cardiovascular;  Laterality: N/A;  . Cardioversion N/A 09/01/2012    Procedure: CARDIOVERSION;  Surgeon: Pixie Casino,  MD;  Location: [facility];  Service: Cardiovascular;  Laterality: N/A;  . Tonsillectomy    . Colonoscopy N/A 06/13/2013    Procedure: COLONOSCOPY;  Surgeon: Inda Castle, MD;  Location: [facility];  Service: Endoscopy;  Laterality: N/A;    Current Medications: Outpatient Prescriptions Prior to Visit  Medication Sig Dispense Refill  . DIGOX 125 MCG tablet TAKE 1 TABLET BY MOUTH ONCE DAILY 30 tablet 8  . diltiazem (CARDIZEM CD) 240 MG 24 hr capsule TAKE 1 CAPSULE BY MOUTH ONCE DAILY 30 capsule 5  . hydrocortisone (ANUSOL-HC) 25 MG suppository Place 1 suppository (25 mg total) rectally at bedtime. 12 suppository 1  . levothyroxine  (SYNTHROID, LEVOTHROID) 100 MCG tablet Take 100 mcg by mouth daily before breakfast.    . metoprolol (LOPRESSOR) 50 MG tablet TAKE ONE TABLET BY MOUTH TWICE DAILY 180 tablet 2  . Multiple Vitamin (MULTIVITAMIN WITH MINERALS) TABS Take 1 tablet by mouth daily.    . pantoprazole (PROTONIX) 40 MG tablet TAKE 1 TABLET BY MOUTH AT 12 NOON 30 tablet 6  . pyridoxine (B-6) 100 MG tablet Take 100 mg by mouth daily.    Alveda Reasons 20 MG TABS tablet TAKE 1 TABLET BY MOUTH DAILY WITH BREAKFAST 30 tablet 6   No facility-administered medications prior to visit.     Allergies:   Morphine   Social History   Social History  . Marital Status: Single    Spouse Name: N/A  . Number of Children: N/A  . Years of Education: N/A   Social History Main Topics  . Smoking status: Never Smoker   . Smokeless tobacco: Never Used  . Alcohol Use: Yes     Comment: 4 drinks per month  . Drug Use: No  . Sexual Activity:    Partners: Female   Other Topics Concern  . None   Social History Narrative     Family History:  The patient's family history includes Breast cancer in his sister; Cervical cancer in his sister; Diabetes in his sister; Heart disease (age of onset: 26) in his father; Liver cancer in his sister; Pancreatic cancer in his sister. There is no history of Colon cancer, Throat cancer, Stomach cancer, Kidney disease, or Liver disease.   ROS:   Please see the history of present illness.    ROS All other systems reviewed and are negative.   PHYSICAL EXAM:   VS:  BP 100/70 mmHg  Pulse 74  Ht 5\' 8"  (1.727 m)  Wt 175.996 kg (388 lb)  BMI 59.01 kg/m2  SpO2 97%   GEN: Superobese, well developed, in no acute distress HEENT: normal Neck: no JVD, carotid bruits, or masses Cardiac: Irregular; no murmurs, rubs, or gallops, or in compression stockings and has 1+ ankle edema symmetrically bilaterally  Respiratory:  clear to auscultation bilaterally, normal work of breathing GI: soft, nontender,  nondistended, + BS MS: no deformity or atrophy Skin: warm and dry, no rash Neuro:  Alert and Oriented x 3, Strength and sensation are intact Psych: euthymic mood, full affect  Wt Readings from Last 3 Encounters:  07/23/15 175.996 kg (388 lb)  06/07/14 173.229 kg (381 lb 14.4 oz)  06/04/13 180.577 kg (398 lb 1.6 oz)      Studies/Labs Reviewed:   EKG:  EKG is ordered today.  The ekg ordered today demonstrates Atrial fibrillation with controlled ventricular rate, QTC 428 ms. Other than the rhythm, he has a normal ECG.  Recent Labs: No results found for requested labs within  last 365 days.   Lipid Panel    Component Value Date/Time   CHOL 109 08/27/2012 0534   TRIG 65 08/27/2012 0534   HDL 45 08/27/2012 0534   CHOLHDL 2.4 08/27/2012 0534   VLDL 13 08/27/2012 0534   LDLCALC 51 08/27/2012 0534     ASSESSMENT:    1. Persistent atrial fibrillation ([place])   2. OSA on CPAP   3. Super obesity ([place])   4. Chronic anticoagulation, with Xarelto      PLAN:  In order of problems listed above:  1. AFib: Rate control appears to now be excellent. We'll try to discontinue the digoxin due to its low therapeutic index. 2. OSA: Reports 100% compliance with CPAP  3. Super-obesity: I'm sure this is involved in his dyspnea both directly (thoracic restriction) and via obstructive sleep apnea and pulmonary hypertension. We discussed bariatric surgery, but he is discouraged by the fact that he has had so many postoperative surgical infections. 4. Anticoagulation: Indicated for both atrial fibrillation (CHADSVasc 1 for CHF, almost 2 due to age) and history of recurrent venous thromboembolic disease    Medication Adjustments/Labs and Tests Ordered: Current medicines are reviewed at length with the patient today.  Concerns regarding medicines are outlined above.  Medication changes, Labs and Tests ordered today are listed in the Patient Instructions below. Patient Instructions  Your physician  recommends that you continue on your current medications as directed. Please refer to the Current Medication list given to you today.  Dr Sallyanne Kuster recommends that you schedule a follow-up appointment in 1 year. You will receive a reminder letter in the mail two months in advance. If you don't receive a letter, please call our office to schedule the follow-up appointment.  If you need a refill on your cardiac medications before your next appointment, please call your pharmacy.   Medication samples have been provided to the patient. Drug name: Xarelto 20 mg Qty: 28 tabs LOT: [id number] Exp.Date: 8/19  Enid Cutter, [place] 8:48 AM 07/23/2015     Signed, Garrett Klein, MD  07/23/2015 12:24 PM    [facility] [address], [place], [place]  [address] Phone: [phone]; Fax: [phone]

## 2015-07-23 NOTE — Patient Instructions (Addendum)
Your physician recommends that you continue on your current medications as directed. Please refer to the Current Medication list given to you today.  Dr Sallyanne Kuster recommends that you schedule a follow-up appointment in 1 year. You will receive a reminder letter in the mail two months in advance. If you don't receive a letter, please call our office to schedule the follow-up appointment.  If you need a refill on your cardiac medications before your next appointment, please call your pharmacy.   Medication samples have been provided to the patient. Drug name: Xarelto 20 mg Qty: 28 tabs LOT: [id number] Exp.Date: 8/19  Enid Cutter, [place] 8:48 AM 07/23/2015

## 2015-07-24 ENCOUNTER — Other Ambulatory Visit: Payer: Self-pay | Admitting: Cardiovascular Disease

## 2015-07-24 NOTE — Telephone Encounter (Signed)
Rx(s) sent to pharmacy electronically.  

## 2015-11-14 ENCOUNTER — Encounter: Payer: Self-pay | Admitting: Cardiovascular Disease

## 2015-11-14 ENCOUNTER — Telehealth: Payer: Self-pay

## 2015-11-14 NOTE — Telephone Encounter (Signed)
Sent via epic 

## 2015-11-14 NOTE — Telephone Encounter (Signed)
Request for surgical clearance:   1. What type surgery is being performed? Revision Left TKA  2. When is this surgery scheduled? 12/15/2015  3. Are there any medications that need to be held prior to surgery and how long? N/A; patient is taking Xarelto 20 mg  4. Name of the physician performing surgery: Dr Paralee Cancel  5. What is the office phone and fax number?   Phone [phone]  Fax [phone]

## 2015-11-19 NOTE — Telephone Encounter (Signed)
Faxed letter to Orson Slick at [facility].

## 2015-11-26 ENCOUNTER — Other Ambulatory Visit (HOSPITAL_COMMUNITY): Payer: Self-pay | Admitting: Orthopedic Surgery

## 2015-11-26 DIAGNOSIS — Z96652 Presence of left artificial knee joint: Secondary | ICD-10-CM

## 2015-12-03 ENCOUNTER — Encounter (HOSPITAL_COMMUNITY): Payer: Self-pay

## 2015-12-04 ENCOUNTER — Encounter (HOSPITAL_COMMUNITY)
Admission: RE | Admit: 2015-12-04 | Discharge: 2015-12-04 | Disposition: A | Discharge status: Home / Self Care | Payer: Medicare Other | Source: Ambulatory Visit | Attending: Orthopedic Surgery | Admitting: Orthopedic Surgery

## 2015-12-04 DIAGNOSIS — Z96652 Presence of left artificial knee joint: Secondary | ICD-10-CM | POA: Insufficient documentation

## 2015-12-04 MED ORDER — TECHNETIUM TC 99M MEDRONATE IV KIT
21.9000 | PACK | Freq: Once | INTRAVENOUS | Status: AC | PRN
  Administered 2015-12-04: 21.9 via INTRAVENOUS

## 2015-12-06 NOTE — H&P (Signed)
TOTAL KNEE REVISION ADMISSION H&P  Patient is being admitted for left revision total knee arthroplasty.  Subjective:  Chief Complaint:   Left knee pain, s/p TKA.  HPI: [name], 65 y.o. male, has a history of pain and functional disability in the left knee(s) due to failed previous arthroplasty and patient has failed non-surgical conservative treatments for greater than 12 weeks to include NSAID's and/or analgesics, use of assistive devices and activity modification. The indications for the revision of the total knee arthroplasty are loosening of one or more components and fracture or mechanical failure of one or components. Onset of symptoms was gradual starting a couple months ago with rapidlly worsening course since that time.  Prior procedures on the left knee(s) include arthroplasty and revision.  Patient currently rates pain in the left knee(s) at 10 out of 10 with activity. There is night pain, worsening of pain with activity and weight bearing, pain that interferes with activities of daily living, pain with passive range of motion, crepitus and joint swelling.  Patient has evidence of prosthetic loosening by imaging studies. This condition presents safety issues increasing the risk of falls.  There is no current active infection.  Risks, benefits and expectations were discussed with the patient.  Risks including but not limited to the risk of anesthesia, blood clots, nerve damage, blood vessel damage, failure of the prosthesis, infection and up to and including death.  Patient understand the risks, benefits and expectations and wishes to proceed with surgery.   PCP: Phineas Inches, MD  D/C Plans:      Home / SNF  Post-op Meds:       No Rx given /  Tranexamic Acid:      To be given - topically  Decadron:      Is to be given  FYI:     Xarelto (on pre-op)  Norco  Dilaudid (ok per pt)   CPAP   Patient Active Problem List   Diagnosis Date Noted  . Benign neoplasm of colon  06/13/2013  . Special screening for malignant neoplasms, colon 04/17/2013  . OSA on CPAP 11/27/2012  . Chronic diastolic heart failure ([facility]) 09/10/2012  . Chronic anticoagulation, with Xarelto 09/10/2012  . DVT (deep venous thrombosis), possible 09/10/2012  . SOB (shortness of breath) 08/26/2012  . Chest discomfort 08/26/2012  . Persistent atrial fibrillation ([place]) 08/26/2012  . Super obesity ([place]) 06/27/2012  . Expected blood loss anemia 06/27/2012  . S/P left TK revision 06/23/2012  . Septic arthritis of knee ([place])   . Cellulitis 09/19/2010  . METHICILLIN RESISTANT STAPHYLOCOCCUS AUREUS INFECTION 09/10/2009  . 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 DM 07/04/2008  . CHRONIC KIDNEY DISEASE UNSPECIFIED 07/04/2008  . PULMONARY EMBOLISM, HX OF 07/04/2008  . INFECTION DUE TO INTERNAL ORTH DEVICE NEC 03/02/2006   Past Medical History:  Diagnosis Date  . CHF (congestive heart failure) ([place])   . Chronic anticoagulation    with Xarelto  . Complication of anesthesia    PT STATES HARD TO WAKE UP AFTER ONE SUGERY -STATES THE SURGERY TOOK LONGER THAN EXPECTED.  NO PROBLEMS WITH ANY OTHER SURGERY  . Dysrhythmia    A-fib  . GERD (gastroesophageal reflux disease)   . Hypothyroidism   . Pain    BACK PAIN - PT ATTRIBUTES TO THE WAY HE WALKS DUE TO LEFT KNEE PROBLEM  . Persistent atrial fibrillation ([place])   . Septic arthritis of knee ([facility])    LEFT KNEE  .  Shortness of breath    WITH EXERTION AND PAIN    Past Surgical History:  Procedure Laterality Date  . 2010 REMOVAL OF LEFT TOTAL KNEE    . CARDIOVERSION N/A 08/28/2012   Procedure: CARDIOVERSION;  Surgeon: Sanda Klein, MD;  Location: [facility];  Service: Cardiovascular;  Laterality: N/A;  . CARDIOVERSION N/A 09/01/2012   Procedure: CARDIOVERSION;  Surgeon: Pixie Casino, MD;  Location: [facility];  Service: Cardiovascular;  Laterality: N/A;  . COLONOSCOPY N/A 06/13/2013   Procedure: COLONOSCOPY;  Surgeon: Inda Castle, MD;  [facility];  Service: Endoscopy;  Laterality: N/A;  . HYDROCELECTOY   2012  . LEFT KNEE ARTHROSCOPY  1999  . LEFT KNEE SURGERY UPPER TIBIAL OSTOMY    . LEFT TOTAL KNEE REMOVAL FOR INFECTION  2006  . REIMPLANTATION LEFT TOTAL KNEE  2008  . REIMPLANTATION LEFT TOTAL KNEE   2006  . REIMPLANTATION OF TOTAL KNEE Left 06/23/2012   Procedure: REIMPLANTATION OF LEFT TOTAL KNEE;  Surgeon: Mauri Pole, MD;  Location: [facility];  Service: Orthopedics;  Laterality: Left;  . REMOVAL LEFT TOTAL KNEE   2008  . REPLACEMENT LEFT KNEE  2002  . REPLACEMENT RIGHT KNEE  2003  . REVISION LEFT KNEE CAP   2004  . RIGHT KNEE ARTHROSCOPY  1998  . TEE WITHOUT CARDIOVERSION N/A 08/28/2012   Procedure: TRANSESOPHAGEAL ECHOCARDIOGRAM (TEE);  Surgeon: Sanda Klein, MD;  [facility];  Service: Cardiovascular;  Laterality: N/A;  . TONSILLECTOMY      No prescriptions prior to admission.   Allergies  Allergen Reactions  . Morphine Hives    Social History  Substance Use Topics  . Smoking status: Never Smoker  . Smokeless tobacco: Never Used  . Alcohol use Yes     Comment: 4 drinks per month    Family History  Problem Relation Age of Onset  . Heart disease Father 87  . Pancreatic cancer Sister   . Liver cancer Sister   . Cervical cancer Sister   . Breast cancer Sister   . Diabetes Sister   . Colon cancer Neg Hx   . Throat cancer Neg Hx   . Stomach cancer Neg Hx   . Kidney disease Neg Hx   . Liver disease Neg Hx      Review of Systems  Constitutional: Negative.   HENT: Positive for hearing loss and tinnitus.   Eyes: Negative.   Respiratory: Positive for shortness of breath (on exertion).   Cardiovascular: Negative.   Gastrointestinal: Positive for heartburn.  Genitourinary: Negative.   Musculoskeletal: Positive for joint pain.  Skin: Negative.   Neurological: Negative.   Endo/Heme/Allergies: Negative.   Psychiatric/Behavioral: Negative.      Objective:  Physical  Exam  Constitutional: He is oriented to person, place, and time. He appears well-developed.  HENT:  Head: Normocephalic.  Eyes: Pupils are equal, round, and reactive to light.  Neck: Neck supple. No JVD present. No tracheal deviation present. No thyromegaly present.  Cardiovascular: Normal rate, regular rhythm, normal heart sounds and intact distal pulses.   Respiratory: Effort normal and breath sounds normal. No respiratory distress. He has no wheezes.  GI: Soft. There is no tenderness. There is no guarding.  Musculoskeletal:       Left knee: He exhibits decreased range of motion, swelling, effusion, laceration (healed previous incision) and bony tenderness. He exhibits no ecchymosis, no deformity and no erythema. Tenderness found.  Lymphadenopathy:    He has no cervical adenopathy.  Neurological: He is alert and oriented to person, place, and time.  Skin: Skin is warm and dry.  Psychiatric: He has a normal mood and affect.       Labs:  Estimated body mass index is 59 kg/m as calculated from the following:   Height as of 07/23/15: 5\' 8"  (1.727 m).   Weight as of 07/23/15: 176 kg (388 lb).  Imaging Review Plain radiographs demonstrate previous revision TKA of the left knee(s). The overall alignment is neutral.There is evidence of loosening of the femoral components. The bone quality appears to be fair for age and reported activity level.   Assessment/Plan:  Left knee(s) with failed previous arthroplasty.   The patient history, physical examination, clinical judgment of the provider and imaging studies are consistent with failure of the left knee(s), previous total knee arthroplasty. Revision total knee arthroplasty is deemed medically necessary. The treatment options including medical management, injection therapy, arthroscopy and revision arthroplasty were discussed at length. The risks and benefits of revision total knee arthroplasty were presented and reviewed. The risks due to  aseptic loosening, infection, stiffness, patella tracking problems, thromboembolic complications and other imponderables were discussed. The patient acknowledged the explanation, agreed to proceed with the plan and consent was signed. Patient is being admitted for inpatient treatment for surgery, pain control, PT, OT, prophylactic antibiotics, VTE prophylaxis, progressive ambulation and ADL's and discharge planning.The patient is planning to be discharged home with home health services / SNF.     Garrett Pugh Baden Betsch   PA-C  12/06/2015, 2:42 PM

## 2015-12-08 ENCOUNTER — Encounter (HOSPITAL_COMMUNITY)
Admission: RE | Admit: 2015-12-08 | Discharge: 2015-12-08 | Disposition: A | Discharge status: Home / Self Care | Payer: Medicare Other | Source: Ambulatory Visit | Attending: Orthopedic Surgery | Admitting: Orthopedic Surgery

## 2015-12-08 ENCOUNTER — Encounter (HOSPITAL_COMMUNITY): Payer: Self-pay

## 2015-12-08 DIAGNOSIS — L0390 Cellulitis, unspecified: Secondary | ICD-10-CM | POA: Diagnosis not present

## 2015-12-08 DIAGNOSIS — A4902 Methicillin resistant Staphylococcus aureus infection, unspecified site: Secondary | ICD-10-CM | POA: Insufficient documentation

## 2015-12-08 DIAGNOSIS — A491 Streptococcal infection, unspecified site: Secondary | ICD-10-CM | POA: Insufficient documentation

## 2015-12-08 DIAGNOSIS — I481 Persistent atrial fibrillation: Secondary | ICD-10-CM | POA: Diagnosis not present

## 2015-12-08 DIAGNOSIS — Z01812 Encounter for preprocedural laboratory examination: Secondary | ICD-10-CM | POA: Diagnosis present

## 2015-12-08 DIAGNOSIS — Z7901 Long term (current) use of anticoagulants: Secondary | ICD-10-CM | POA: Insufficient documentation

## 2015-12-08 DIAGNOSIS — Z96659 Presence of unspecified artificial knee joint: Secondary | ICD-10-CM | POA: Diagnosis not present

## 2015-12-08 DIAGNOSIS — D126 Benign neoplasm of colon, unspecified: Secondary | ICD-10-CM | POA: Insufficient documentation

## 2015-12-08 DIAGNOSIS — Z86718 Personal history of other venous thrombosis and embolism: Secondary | ICD-10-CM | POA: Insufficient documentation

## 2015-12-08 DIAGNOSIS — T847XXA Infection and inflammatory reaction due to other internal orthopedic prosthetic devices, implants and grafts, initial encounter: Secondary | ICD-10-CM | POA: Insufficient documentation

## 2015-12-08 DIAGNOSIS — E119 Type 2 diabetes mellitus without complications: Secondary | ICD-10-CM | POA: Diagnosis not present

## 2015-12-08 DIAGNOSIS — I5032 Chronic diastolic (congestive) heart failure: Secondary | ICD-10-CM | POA: Diagnosis not present

## 2015-12-08 DIAGNOSIS — R0602 Shortness of breath: Secondary | ICD-10-CM | POA: Insufficient documentation

## 2015-12-08 DIAGNOSIS — M009 Pyogenic arthritis, unspecified: Secondary | ICD-10-CM | POA: Insufficient documentation

## 2015-12-08 DIAGNOSIS — N189 Chronic kidney disease, unspecified: Secondary | ICD-10-CM | POA: Diagnosis not present

## 2015-12-08 DIAGNOSIS — G4733 Obstructive sleep apnea (adult) (pediatric): Secondary | ICD-10-CM | POA: Insufficient documentation

## 2015-12-08 HISTORY — DX: Personal history of other venous thrombosis and embolism: Z86.718

## 2015-12-08 HISTORY — DX: Sleep apnea, unspecified: G47.30

## 2015-12-08 HISTORY — DX: Personal history of other medical treatment: Z92.89

## 2015-12-08 HISTORY — DX: Headache: R51

## 2015-12-08 LAB — BASIC METABOLIC PANEL
Anion gap: 7 (ref 5–15)
BUN: 20 mg/dL (ref 6–20)
CHLORIDE: 110 mmol/L (ref 101–111)
CO2: 23 mmol/L (ref 22–32)
CREATININE: 1 mg/dL (ref 0.61–1.24)
Calcium: 10.3 mg/dL (ref 8.9–10.3)
GFR calc Af Amer: >60 mL/min (ref >60)
GFR calc non Af Amer: >60 mL/min (ref >60)
GLUCOSE: 101 mg/dL — AB (ref 65–99)
Potassium: 4.3 mmol/L (ref 3.5–5.1)
Sodium: 140 mmol/L (ref 135–145)

## 2015-12-08 LAB — SURGICAL PCR SCREEN
MRSA, PCR: NEGATIVE
Staphylococcus aureus: NEGATIVE

## 2015-12-08 LAB — CBC
HEMATOCRIT: 40.4 % (ref 39.0–52.0)
HEMOGLOBIN: 13.4 g/dL (ref 13.0–17.0)
MCH: 31.1 pg (ref 26.0–34.0)
MCHC: 33.2 g/dL (ref 30.0–36.0)
MCV: 93.7 fL (ref 78.0–100.0)
Platelets: 220 10*3/uL (ref 150–400)
RBC: 4.31 MIL/uL (ref 4.22–5.81)
RDW: 14 % (ref 11.5–15.5)
WBC: 5.6 10*3/uL (ref 4.0–10.5)

## 2015-12-08 NOTE — Progress Notes (Signed)
Spoke with [name] in portable equip in regards to order for [employer]. Info given. Order released.

## 2015-12-08 NOTE — Progress Notes (Signed)
Clearance note per chart per Dr Coletta Memos 11/27/2015 Clearance note per chart per Dr Sallyanne Kuster 11/14/2015  Pt is aware that Dr Sallyanne Kuster informed to discontinue Xarelto 48 hours prior to procedure date.

## 2015-12-08 NOTE — Patient Instructions (Addendum)
[name]  12/08/2015   Your procedure is scheduled on: Monday December 15, 2015  Report to [facility] Main  Entrance take [place]  elevators to 3rd floor to  [place] at 10:30 AM.  Call this number if you have problems the morning of surgery [phone]   Remember: ONLY 1 PERSON MAY GO WITH YOU TO SHORT STAY TO GET  READY MORNING OF [place].  Do not eat food or drink liquids :After Midnight.     Take these medicines the morning of surgery with A SIP OF WATER: Diltiazem; Levothyroxine; Metoprolol             HOLD XARELTO 48 HOURS PRIOR TO PROCEDURE DATE PER DR CROITORU; LAST DOSE 12/12/2015 TILL AFTER PROCEDURE                                You may not have any metal on your body including hair pins and              piercings  Do not wear jewelry,  lotions, powders or colognes, deodorant                    Men may shave face and neck.   Do not bring valuables to the hospital. [place].  Contacts, dentures or bridgework may not be worn into surgery.  Leave suitcase in the car. After surgery it may be brought to your room.   Special Instructions: BRING CPAP MASK AND TUBING WITH YOU DAY OF SURGERY              Please read over the following fact sheets you were given:MRSA INFORMATION SHEET; INCENTIVE SPIROMETER; BLOOD TRANSFUSION INFORMATION SHEET  _____________________________________________________________________             [facility] - Preparing for Surgery Before surgery, you can play an important role.  Because skin is not sterile, your skin needs to be as free of germs as possible.  You can reduce the number of germs on your skin by washing with CHG (chlorahexidine gluconate) soap before surgery.  CHG is an antiseptic cleaner which kills germs and bonds with the skin to continue killing germs even after washing. Please DO NOT use if you have an allergy to CHG or antibacterial soaps.   If your skin becomes reddened/irritated stop using the CHG and inform your nurse when you arrive at Short Stay. Do not shave (including legs and underarms) for at least 48 hours prior to the first CHG shower.  You may shave your face/neck. Please follow these instructions carefully:  1.  Shower with CHG Soap the night before surgery and the  morning of Surgery.  2.  If you choose to wash your hair, wash your hair first as usual with your  normal  shampoo.  3.  After you shampoo, rinse your hair and body thoroughly to remove the  shampoo.                           4.  Use CHG as you would any other liquid soap.  You can apply chg directly  to the skin and wash  Gently with a scrungie or clean washcloth.  5.  Apply the CHG Soap to your body ONLY FROM THE NECK DOWN.   Do not use on face/ open                           Wound or open sores. Avoid contact with eyes, ears mouth and genitals (private parts).                       Wash face,  Genitals (private parts) with your normal soap.             6.  Wash thoroughly, paying special attention to the area where your surgery  will be performed.  7.  Thoroughly rinse your body with warm water from the neck down.  8.  DO NOT shower/wash with your normal soap after using and rinsing off  the CHG Soap.                9.  Pat yourself dry with a clean towel.            10.  Wear clean pajamas.            11.  Place clean sheets on your bed the night of your first shower and do not  sleep with pets. Day of Surgery : Do not apply any lotions/deodorants the morning of surgery.  Please wear clean clothes to the hospital/surgery center.  FAILURE TO FOLLOW THESE INSTRUCTIONS MAY RESULT IN THE CANCELLATION OF YOUR SURGERY PATIENT SIGNATURE_________________________________  NURSE SIGNATURE__________________________________  ________________________________________________________________________   Adam Phenix  An incentive  spirometer is a tool that can help keep your lungs clear and active. This tool measures how well you are filling your lungs with each breath. Taking long deep breaths may help reverse or decrease the chance of developing breathing (pulmonary) problems (especially infection) following:  A long period of time when you are unable to move or be active. BEFORE THE PROCEDURE   If the spirometer includes an indicator to show your best effort, your nurse or respiratory therapist will set it to a desired goal.  If possible, sit up straight or lean slightly forward. Try not to slouch.  Hold the incentive spirometer in an upright position. INSTRUCTIONS FOR USE  1. Sit on the edge of your bed if possible, or sit up as far as you can in bed or on a chair. 2. Hold the incentive spirometer in an upright position. 3. Breathe out normally. 4. Place the mouthpiece in your mouth and seal your lips tightly around it. 5. Breathe in slowly and as deeply as possible, raising the piston or the ball toward the top of the column. 6. Hold your breath for 3-5 seconds or for as long as possible. Allow the piston or ball to fall to the bottom of the column. 7. Remove the mouthpiece from your mouth and breathe out normally. 8. Rest for a few seconds and repeat Steps 1 through 7 at least 10 times every 1-2 hours when you are awake. Take your time and take a few normal breaths between deep breaths. 9. The spirometer may include an indicator to show your best effort. Use the indicator as a goal to work toward during each repetition. 10. After each set of 10 deep breaths, practice coughing to be sure your lungs are clear. If you have an incision (the cut made at the time of surgery),  support your incision when coughing by placing a pillow or rolled up towels firmly against it. Once you are able to get out of bed, walk around indoors and cough well. You may stop using the incentive spirometer when instructed by your caregiver.   RISKS AND COMPLICATIONS  Take your time so you do not get dizzy or light-headed.  If you are in pain, you may need to take or ask for pain medication before doing incentive spirometry. It is harder to take a deep breath if you are having pain. AFTER USE  Rest and breathe slowly and easily.  It can be helpful to keep track of a log of your progress. Your caregiver can provide you with a simple table to help with this. If you are using the spirometer at home, follow these instructions: [place] IF:   You are having difficultly using the spirometer.  You have trouble using the spirometer as often as instructed.  Your pain medication is not giving enough relief while using the spirometer.  You develop fever of 100.5 F (38.1 C) or higher. SEEK IMMEDIATE MEDICAL CARE IF:   You cough up bloody sputum that had not been present before.  You develop fever of 102 F (38.9 C) or greater.  You develop worsening pain at or near the incision site. MAKE SURE YOU:   Understand these instructions.  Will watch your condition.  Will get help right away if you are not doing well or get worse. Document Released: 06/07/2006 Document Revised: 04/19/2011 Document Reviewed: 08/08/2006 ExitCare Patient Information 2014 ExitCare, [place].   ________________________________________________________________________  WHAT IS A BLOOD TRANSFUSION? Blood Transfusion Information  A transfusion is the replacement of blood or some of its parts. Blood is made up of multiple cells which provide different functions.  Red blood cells carry oxygen and are used for blood loss replacement.  White blood cells fight against infection.  Platelets control bleeding.  Plasma helps clot blood.  Other blood products are available for specialized needs, such as hemophilia or other clotting disorders. BEFORE THE TRANSFUSION  Who gives blood for transfusions?   Healthy volunteers who are fully evaluated  to make sure their blood is safe. This is blood bank blood. Transfusion therapy is the safest it has ever been in the practice of medicine. Before blood is taken from a donor, a complete history is taken to make sure that person has no history of diseases nor engages in risky social behavior (examples are intravenous drug use or sexual activity with multiple partners). The donor's travel history is screened to minimize risk of transmitting infections, such as malaria. The donated blood is tested for signs of infectious diseases, such as HIV and hepatitis. The blood is then tested to be sure it is compatible with you in order to minimize the chance of a transfusion reaction. If you or a relative donates blood, this is often done in anticipation of surgery and is not appropriate for emergency situations. It takes many days to process the donated blood. RISKS AND COMPLICATIONS Although transfusion therapy is very safe and saves many lives, the main dangers of transfusion include:   Getting an infectious disease.  Developing a transfusion reaction. This is an allergic reaction to something in the blood you were given. Every precaution is taken to prevent this. The decision to have a blood transfusion has been considered carefully by your caregiver before blood is given. Blood is not given unless the benefits outweigh the risks. AFTER THE TRANSFUSION  Right after receiving a blood transfusion, you will usually feel much better and more energetic. This is especially true if your red blood cells have gotten low (anemic). The transfusion raises the level of the red blood cells which carry oxygen, and this usually causes an energy increase.  The nurse administering the transfusion will monitor you carefully for complications. HOME CARE INSTRUCTIONS  No special instructions are needed after a transfusion. You may find your energy is better. Speak with your caregiver about any limitations on activity for  underlying diseases you may have. SEEK MEDICAL CARE IF:   Your condition is not improving after your transfusion.  You develop redness or irritation at the intravenous (IV) site. SEEK IMMEDIATE MEDICAL CARE IF:  Any of the following symptoms occur over the next 12 hours:  Shaking chills.  You have a temperature by mouth above 102 F (38.9 C), not controlled by medicine.  Chest, back, or muscle pain.  People around you feel you are not acting correctly or are confused.  Shortness of breath or difficulty breathing.  Dizziness and fainting.  You get a rash or develop hives.  You have a decrease in urine output.  Your urine turns a dark color or changes to pink, red, or brown. Any of the following symptoms occur over the next 10 days:  You have a temperature by mouth above 102 F (38.9 C), not controlled by medicine.  Shortness of breath.  Weakness after normal activity.  The white part of the eye turns yellow (jaundice).  You have a decrease in the amount of urine or are urinating less often.  Your urine turns a dark color or changes to pink, red, or brown. Document Released: 01/23/2000 Document Revised: 04/19/2011 Document Reviewed: 09/11/2007 [facility] Patient Information 2014 [place], [place].  _______________________________________________________________________

## 2015-12-14 MED ORDER — DEXTROSE 5 % IV SOLN
3.0000 g | INTRAVENOUS | Status: AC
  Administered 2015-12-15: 3 g via INTRAVENOUS
  Filled 2015-12-14: qty 3
  Filled 2015-12-14: qty 3000

## 2015-12-15 ENCOUNTER — Encounter (HOSPITAL_COMMUNITY): Payer: Self-pay | Admitting: *Deleted

## 2015-12-15 ENCOUNTER — Inpatient Hospital Stay (HOSPITAL_COMMUNITY)
Admission: RE | Admit: 2015-12-15 | Discharge: 2015-12-16 | DRG: 467 | Disposition: A | Discharge status: Home / Self Care | Payer: Medicare Other | Source: Ambulatory Visit | Attending: Orthopedic Surgery | Admitting: Orthopedic Surgery

## 2015-12-15 ENCOUNTER — Inpatient Hospital Stay (HOSPITAL_COMMUNITY): Payer: Medicare Other | Admitting: Anesthesiology

## 2015-12-15 ENCOUNTER — Encounter (HOSPITAL_COMMUNITY)
Admission: RE | Disposition: A | Discharge status: Home / Self Care | Payer: Self-pay | Source: Ambulatory Visit | Attending: Orthopedic Surgery

## 2015-12-15 DIAGNOSIS — Z800 Family history of malignant neoplasm of digestive organs: Secondary | ICD-10-CM | POA: Diagnosis not present

## 2015-12-15 DIAGNOSIS — M549 Dorsalgia, unspecified: Secondary | ICD-10-CM | POA: Diagnosis present

## 2015-12-15 DIAGNOSIS — Z7901 Long term (current) use of anticoagulants: Secondary | ICD-10-CM | POA: Diagnosis not present

## 2015-12-15 DIAGNOSIS — Z8249 Family history of ischemic heart disease and other diseases of the circulatory system: Secondary | ICD-10-CM | POA: Diagnosis not present

## 2015-12-15 DIAGNOSIS — Z833 Family history of diabetes mellitus: Secondary | ICD-10-CM | POA: Diagnosis not present

## 2015-12-15 DIAGNOSIS — T8484XA Pain due to internal orthopedic prosthetic devices, implants and grafts, initial encounter: Secondary | ICD-10-CM | POA: Diagnosis present

## 2015-12-15 DIAGNOSIS — E039 Hypothyroidism, unspecified: Secondary | ICD-10-CM | POA: Diagnosis present

## 2015-12-15 DIAGNOSIS — Z6841 Body Mass Index (BMI) 40.0 and over, adult: Secondary | ICD-10-CM

## 2015-12-15 DIAGNOSIS — Z8614 Personal history of Methicillin resistant Staphylococcus aureus infection: Secondary | ICD-10-CM | POA: Diagnosis not present

## 2015-12-15 DIAGNOSIS — M25562 Pain in left knee: Secondary | ICD-10-CM | POA: Diagnosis present

## 2015-12-15 DIAGNOSIS — I481 Persistent atrial fibrillation: Secondary | ICD-10-CM | POA: Diagnosis present

## 2015-12-15 DIAGNOSIS — I5032 Chronic diastolic (congestive) heart failure: Secondary | ICD-10-CM | POA: Diagnosis present

## 2015-12-15 DIAGNOSIS — Z96651 Presence of right artificial knee joint: Secondary | ICD-10-CM | POA: Diagnosis present

## 2015-12-15 DIAGNOSIS — Y838 Other surgical procedures as the cause of abnormal reaction of the patient, or of later complication, without mention of misadventure at the time of the procedure: Secondary | ICD-10-CM | POA: Diagnosis present

## 2015-12-15 DIAGNOSIS — T84033A Mechanical loosening of internal left knee prosthetic joint, initial encounter: Secondary | ICD-10-CM | POA: Diagnosis present

## 2015-12-15 DIAGNOSIS — G4733 Obstructive sleep apnea (adult) (pediatric): Secondary | ICD-10-CM | POA: Diagnosis present

## 2015-12-15 DIAGNOSIS — Z885 Allergy status to narcotic agent status: Secondary | ICD-10-CM | POA: Diagnosis not present

## 2015-12-15 DIAGNOSIS — Z9989 Dependence on other enabling machines and devices: Secondary | ICD-10-CM | POA: Diagnosis not present

## 2015-12-15 DIAGNOSIS — K219 Gastro-esophageal reflux disease without esophagitis: Secondary | ICD-10-CM | POA: Diagnosis present

## 2015-12-15 DIAGNOSIS — Z96652 Presence of left artificial knee joint: Secondary | ICD-10-CM

## 2015-12-15 DIAGNOSIS — Z8049 Family history of malignant neoplasm of other genital organs: Secondary | ICD-10-CM | POA: Diagnosis not present

## 2015-12-15 DIAGNOSIS — H9190 Unspecified hearing loss, unspecified ear: Secondary | ICD-10-CM | POA: Diagnosis present

## 2015-12-15 DIAGNOSIS — Z86711 Personal history of pulmonary embolism: Secondary | ICD-10-CM

## 2015-12-15 DIAGNOSIS — I110 Hypertensive heart disease with heart failure: Secondary | ICD-10-CM | POA: Diagnosis present

## 2015-12-15 HISTORY — PX: TOTAL KNEE REVISION: SHX996

## 2015-12-15 LAB — TYPE AND SCREEN
ABO/RH(D): O POS
ANTIBODY SCREEN: NEGATIVE

## 2015-12-15 LAB — PROTIME-INR
INR: 1.01
Prothrombin Time: 13.3 seconds (ref 11.4–15.2)

## 2015-12-15 SURGERY — TOTAL KNEE REVISION
Anesthesia: General | Site: Knee | Laterality: Left

## 2015-12-15 MED ORDER — LIDOCAINE 2% (20 MG/ML) 5 ML SYRINGE
INTRAMUSCULAR | Status: DC | PRN
  Administered 2015-12-15: 20 mg via INTRAVENOUS

## 2015-12-15 MED ORDER — DEXAMETHASONE SODIUM PHOSPHATE 10 MG/ML IJ SOLN
INTRAMUSCULAR | Status: AC
  Filled 2015-12-15: qty 1

## 2015-12-15 MED ORDER — DOCUSATE SODIUM 100 MG PO CAPS
100.0000 mg | ORAL_CAPSULE | Freq: Two times a day (BID) | ORAL | Status: DC
  Administered 2015-12-15 – 2015-12-16 (×2): 100 mg via ORAL
  Filled 2015-12-15 (×2): qty 1

## 2015-12-15 MED ORDER — SUCCINYLCHOLINE CHLORIDE 20 MG/ML IJ SOLN
INTRAMUSCULAR | Status: AC
  Filled 2015-12-15: qty 1

## 2015-12-15 MED ORDER — LACTATED RINGERS IV SOLN
INTRAVENOUS | Status: DC
  Administered 2015-12-15 (×2): via INTRAVENOUS

## 2015-12-15 MED ORDER — METOCLOPRAMIDE HCL 5 MG/ML IJ SOLN: 5.0000 mg | Freq: Three times a day (TID) | INTRAMUSCULAR | Status: DC | PRN

## 2015-12-15 MED ORDER — SODIUM CHLORIDE 0.9 % IJ SOLN
INTRAMUSCULAR | Status: AC
  Filled 2015-12-15: qty 50

## 2015-12-15 MED ORDER — MIDAZOLAM HCL 2 MG/2ML IJ SOLN
2.0000 mg | Freq: Once | INTRAMUSCULAR | Status: AC
  Administered 2015-12-15: 2 mg via INTRAVENOUS

## 2015-12-15 MED ORDER — EPINEPHRINE PF 1 MG/ML IJ SOLN
INTRAMUSCULAR | Status: AC
  Filled 2015-12-15: qty 1

## 2015-12-15 MED ORDER — HYDROCODONE-ACETAMINOPHEN 7.5-325 MG PO TABS
1.0000 | ORAL_TABLET | ORAL | Status: DC
  Administered 2015-12-15 – 2015-12-16 (×5): 1 via ORAL
  Filled 2015-12-15 (×4): qty 2
  Filled 2015-12-15 (×2): qty 1

## 2015-12-15 MED ORDER — PHENOL 1.4 % MT LIQD: 1.0000 | OROMUCOSAL | Status: DC | PRN

## 2015-12-15 MED ORDER — ONDANSETRON HCL 4 MG/2ML IJ SOLN
INTRAMUSCULAR | Status: AC
  Filled 2015-12-15: qty 2

## 2015-12-15 MED ORDER — HYDROMORPHONE HCL 1 MG/ML IJ SOLN: 0.5000 mg | INTRAMUSCULAR | Status: DC | PRN

## 2015-12-15 MED ORDER — SODIUM CHLORIDE 0.9 % IV SOLN
INTRAVENOUS | Status: DC
  Administered 2015-12-15: 19:00:00 via INTRAVENOUS
  Filled 2015-12-15 (×3): qty 1000

## 2015-12-15 MED ORDER — CEFAZOLIN SODIUM-DEXTROSE 2-4 GM/100ML-% IV SOLN
2.0000 g | Freq: Four times a day (QID) | INTRAVENOUS | Status: AC
  Administered 2015-12-15 – 2015-12-16 (×2): 2 g via INTRAVENOUS
  Filled 2015-12-15 (×2): qty 100

## 2015-12-15 MED ORDER — PANTOPRAZOLE SODIUM 40 MG PO TBEC
40.0000 mg | DELAYED_RELEASE_TABLET | Freq: Every day | ORAL | Status: DC
  Administered 2015-12-16: 40 mg via ORAL
  Filled 2015-12-15: qty 1

## 2015-12-15 MED ORDER — MIDAZOLAM HCL 2 MG/2ML IJ SOLN
INTRAMUSCULAR | Status: AC
  Filled 2015-12-15: qty 2

## 2015-12-15 MED ORDER — MEPERIDINE HCL 50 MG/ML IJ SOLN: 6.2500 mg | INTRAMUSCULAR | Status: DC | PRN

## 2015-12-15 MED ORDER — FERROUS SULFATE 325 (65 FE) MG PO TABS
325.0000 mg | ORAL_TABLET | Freq: Three times a day (TID) | ORAL | Status: DC
  Administered 2015-12-15 – 2015-12-16 (×3): 325 mg via ORAL
  Filled 2015-12-15 (×3): qty 1

## 2015-12-15 MED ORDER — FENTANYL CITRATE (PF) 100 MCG/2ML IJ SOLN
INTRAMUSCULAR | Status: AC
  Filled 2015-12-15: qty 2

## 2015-12-15 MED ORDER — LIDOCAINE 2% (20 MG/ML) 5 ML SYRINGE
INTRAMUSCULAR | Status: AC
Start: 2015-12-15 — End: 2015-12-15
  Filled 2015-12-15: qty 5

## 2015-12-15 MED ORDER — PHENYLEPHRINE HCL 10 MG/ML IJ SOLN
INTRAMUSCULAR | Status: DC | PRN
  Administered 2015-12-15: 80 ug via INTRAVENOUS

## 2015-12-15 MED ORDER — 0.9 % SODIUM CHLORIDE (POUR BTL) OPTIME
TOPICAL | Status: DC | PRN
  Administered 2015-12-15: 1000 mL

## 2015-12-15 MED ORDER — BUPIVACAINE HCL (PF) 0.5 % IJ SOLN
INTRAMUSCULAR | Status: AC
  Filled 2015-12-15: qty 30

## 2015-12-15 MED ORDER — METHOCARBAMOL 500 MG PO TABS: 500.0000 mg | ORAL_TABLET | Freq: Four times a day (QID) | ORAL | Status: DC | PRN

## 2015-12-15 MED ORDER — RIVAROXABAN 10 MG PO TABS
10.0000 mg | ORAL_TABLET | ORAL | Status: DC
  Administered 2015-12-16: 10 mg via ORAL
  Filled 2015-12-15: qty 1

## 2015-12-15 MED ORDER — BUPIVACAINE-EPINEPHRINE (PF) 0.5% -1:200000 IJ SOLN
INTRAMUSCULAR | Status: DC | PRN
  Administered 2015-12-15: 30 mL via PERINEURAL

## 2015-12-15 MED ORDER — ONDANSETRON HCL 4 MG/2ML IJ SOLN: 4.0000 mg | Freq: Four times a day (QID) | INTRAMUSCULAR | Status: DC | PRN

## 2015-12-15 MED ORDER — MAGNESIUM CITRATE PO SOLN: 1.0000 | Freq: Once | ORAL | Status: DC | PRN

## 2015-12-15 MED ORDER — HYDROMORPHONE HCL 1 MG/ML IJ SOLN
INTRAMUSCULAR | Status: AC
Start: 2015-12-15 — End: 2015-12-16
  Filled 2015-12-15: qty 1

## 2015-12-15 MED ORDER — ONDANSETRON HCL 4 MG PO TABS: 4.0000 mg | ORAL_TABLET | Freq: Four times a day (QID) | ORAL | Status: DC | PRN

## 2015-12-15 MED ORDER — METOCLOPRAMIDE HCL 5 MG PO TABS: 5.0000 mg | ORAL_TABLET | Freq: Three times a day (TID) | ORAL | Status: DC | PRN

## 2015-12-15 MED ORDER — SUGAMMADEX SODIUM 200 MG/2ML IV SOLN
INTRAVENOUS | Status: DC | PRN
  Administered 2015-12-15: 400 mg via INTRAVENOUS

## 2015-12-15 MED ORDER — TRANEXAMIC ACID 1000 MG/10ML IV SOLN
INTRAVENOUS | Status: DC | PRN
  Administered 2015-12-15: 2000 mg via TOPICAL

## 2015-12-15 MED ORDER — LEVOTHYROXINE SODIUM 112 MCG PO TABS
112.0000 ug | ORAL_TABLET | Freq: Every day | ORAL | Status: DC
  Administered 2015-12-16: 112 ug via ORAL
  Filled 2015-12-15: qty 1

## 2015-12-15 MED ORDER — DILTIAZEM HCL ER COATED BEADS 240 MG PO CP24
240.0000 mg | ORAL_CAPSULE | Freq: Every day | ORAL | Status: DC
  Administered 2015-12-16: 240 mg via ORAL
  Filled 2015-12-15: qty 1

## 2015-12-15 MED ORDER — ALUM & MAG HYDROXIDE-SIMETH 200-200-20 MG/5ML PO SUSP: 30.0000 mL | ORAL | Status: DC | PRN

## 2015-12-15 MED ORDER — POLYETHYLENE GLYCOL 3350 17 G PO PACK
17.0000 g | PACK | Freq: Two times a day (BID) | ORAL | Status: DC
  Administered 2015-12-15 – 2015-12-16 (×2): 17 g via ORAL
  Filled 2015-12-15 (×2): qty 1

## 2015-12-15 MED ORDER — SUCCINYLCHOLINE CHLORIDE 20 MG/ML IJ SOLN
INTRAMUSCULAR | Status: DC | PRN
  Administered 2015-12-15: 200 mg via INTRAVENOUS

## 2015-12-15 MED ORDER — PROMETHAZINE HCL 25 MG/ML IJ SOLN: 6.2500 mg | INTRAMUSCULAR | Status: DC | PRN

## 2015-12-15 MED ORDER — DIPHENHYDRAMINE HCL 25 MG PO CAPS: 25.0000 mg | ORAL_CAPSULE | Freq: Four times a day (QID) | ORAL | Status: DC | PRN

## 2015-12-15 MED ORDER — STERILE WATER FOR IRRIGATION IR SOLN
Status: DC | PRN
  Administered 2015-12-15: 2000 mL

## 2015-12-15 MED ORDER — DEXAMETHASONE SODIUM PHOSPHATE 10 MG/ML IJ SOLN
10.0000 mg | Freq: Once | INTRAMUSCULAR | Status: AC
  Administered 2015-12-15: 10 mg via INTRAVENOUS

## 2015-12-15 MED ORDER — PROPOFOL 10 MG/ML IV BOLUS
INTRAVENOUS | Status: AC
  Filled 2015-12-15: qty 60

## 2015-12-15 MED ORDER — METOPROLOL TARTRATE 50 MG PO TABS
50.0000 mg | ORAL_TABLET | Freq: Two times a day (BID) | ORAL | Status: DC
  Administered 2015-12-15 – 2015-12-16 (×2): 50 mg via ORAL
  Filled 2015-12-15 (×2): qty 1

## 2015-12-15 MED ORDER — FENTANYL CITRATE (PF) 100 MCG/2ML IJ SOLN
100.0000 ug | Freq: Once | INTRAMUSCULAR | Status: AC
  Administered 2015-12-15: 100 ug via INTRAVENOUS

## 2015-12-15 MED ORDER — DEXAMETHASONE SODIUM PHOSPHATE 10 MG/ML IJ SOLN: 10.0000 mg | Freq: Once | INTRAMUSCULAR | Status: DC

## 2015-12-15 MED ORDER — HYDROMORPHONE HCL 1 MG/ML IJ SOLN
0.2500 mg | INTRAMUSCULAR | Status: DC | PRN
  Administered 2015-12-15 (×5): 0.5 mg via INTRAVENOUS

## 2015-12-15 MED ORDER — ROCURONIUM BROMIDE 10 MG/ML (PF) SYRINGE
PREFILLED_SYRINGE | INTRAVENOUS | Status: DC | PRN
  Administered 2015-12-15: 50 mg via INTRAVENOUS

## 2015-12-15 MED ORDER — SODIUM CHLORIDE 0.9 % IR SOLN
Status: DC | PRN
  Administered 2015-12-15: 2000 mL

## 2015-12-15 MED ORDER — SUGAMMADEX SODIUM 200 MG/2ML IV SOLN
INTRAVENOUS | Status: AC
  Filled 2015-12-15: qty 2

## 2015-12-15 MED ORDER — BUPIVACAINE HCL (PF) 0.25 % IJ SOLN
INTRAMUSCULAR | Status: AC
  Filled 2015-12-15: qty 30

## 2015-12-15 MED ORDER — PROPOFOL 10 MG/ML IV BOLUS
INTRAVENOUS | Status: AC
  Filled 2015-12-15: qty 20

## 2015-12-15 MED ORDER — ROCURONIUM BROMIDE 50 MG/5ML IV SOSY
PREFILLED_SYRINGE | INTRAVENOUS | Status: AC
  Filled 2015-12-15: qty 5

## 2015-12-15 MED ORDER — MENTHOL 3 MG MT LOZG: 1.0000 | LOZENGE | OROMUCOSAL | Status: DC | PRN

## 2015-12-15 MED ORDER — ONDANSETRON HCL 4 MG/2ML IJ SOLN
INTRAMUSCULAR | Status: DC | PRN
  Administered 2015-12-15: 4 mg via INTRAVENOUS

## 2015-12-15 MED ORDER — PHENYLEPHRINE 40 MCG/ML (10ML) SYRINGE FOR IV PUSH (FOR BLOOD PRESSURE SUPPORT)
PREFILLED_SYRINGE | INTRAVENOUS | Status: AC
Start: 2015-12-15 — End: 2015-12-15
  Filled 2015-12-15: qty 10

## 2015-12-15 MED ORDER — CHLORHEXIDINE GLUCONATE 4 % EX LIQD: 60.0000 mL | Freq: Once | CUTANEOUS | Status: DC

## 2015-12-15 MED ORDER — BISACODYL 10 MG RE SUPP: 10.0000 mg | Freq: Every day | RECTAL | Status: DC | PRN

## 2015-12-15 MED ORDER — METHOCARBAMOL 1000 MG/10ML IJ SOLN
500.0000 mg | Freq: Four times a day (QID) | INTRAVENOUS | Status: DC | PRN
  Filled 2015-12-15: qty 5

## 2015-12-15 MED ORDER — HYDROMORPHONE HCL 1 MG/ML IJ SOLN
INTRAMUSCULAR | Status: AC
  Filled 2015-12-15: qty 1

## 2015-12-15 MED ORDER — MIDAZOLAM HCL 2 MG/2ML IJ SOLN: 0.5000 mg | Freq: Once | INTRAMUSCULAR | Status: DC | PRN

## 2015-12-15 MED ORDER — FENTANYL CITRATE (PF) 100 MCG/2ML IJ SOLN
INTRAMUSCULAR | Status: DC | PRN
  Administered 2015-12-15 (×2): 50 ug via INTRAVENOUS

## 2015-12-15 MED ORDER — CELECOXIB 200 MG PO CAPS
200.0000 mg | ORAL_CAPSULE | Freq: Two times a day (BID) | ORAL | Status: DC
  Administered 2015-12-15 – 2015-12-16 (×2): 200 mg via ORAL
  Filled 2015-12-15 (×2): qty 1

## 2015-12-15 MED ORDER — TRANEXAMIC ACID 1000 MG/10ML IV SOLN
2000.0000 mg | Freq: Once | INTRAVENOUS | Status: DC
  Filled 2015-12-15: qty 20

## 2015-12-15 MED ORDER — PROPOFOL 10 MG/ML IV BOLUS
INTRAVENOUS | Status: DC | PRN
  Administered 2015-12-15: 250 mg via INTRAVENOUS
  Administered 2015-12-15: 20 mg via INTRAVENOUS

## 2015-12-15 MED ORDER — KETOROLAC TROMETHAMINE 30 MG/ML IJ SOLN
INTRAMUSCULAR | Status: AC
  Filled 2015-12-15: qty 1

## 2015-12-15 MED ORDER — PHENYLEPHRINE HCL 10 MG/ML IJ SOLN
INTRAMUSCULAR | Status: AC
  Filled 2015-12-15: qty 1

## 2015-12-15 SURGICAL SUPPLY — 71 items
ADAPTER BOLT FEMORAL +2/-2 (Knees) ×2 IMPLANT
ADPR FEM 5D STRL KN PFC SGM (Orthopedic Implant) ×1 IMPLANT
AUGMENT DISTAL LEFT 12 (Knees) ×1 IMPLANT
BAG DECANTER FOR FLEXI CONT (MISCELLANEOUS) ×2 IMPLANT
BAG ZIPLOCK 12X15 (MISCELLANEOUS) ×2 IMPLANT
BANDAGE ACE 6X5 VEL STRL LF (GAUZE/BANDAGES/DRESSINGS) ×2 IMPLANT
BLADE SAW SGTL 13.0X1.19X90.0M (BLADE) ×2 IMPLANT
BLADE SAW SGTL 81X20 HD (BLADE) ×2 IMPLANT
BONE CEMENT GENTAMICIN (Cement) ×4 IMPLANT
BRUSH FEMORAL CANAL (MISCELLANEOUS) IMPLANT
CEMENT BONE GENTAMICIN 40 (Cement) ×2 IMPLANT
CLOTH BEACON ORANGE TIMEOUT ST (SAFETY) ×2 IMPLANT
CUFF TOURN SGL QUICK 44 (TOURNIQUET CUFF) ×2 IMPLANT
DERMABOND ADVANCED (GAUZE/BANDAGES/DRESSINGS) ×1
DERMABOND ADVANCED .7 DNX12 (GAUZE/BANDAGES/DRESSINGS) ×1 IMPLANT
DISTAL AUG 12 LEFT (Knees) ×2 IMPLANT
DRAPE U-SHAPE 47X51 STRL (DRAPES) ×2 IMPLANT
DRESSING AQUACEL AG SP 3.5X10 (GAUZE/BANDAGES/DRESSINGS) IMPLANT
DRSG ADAPTIC 3X8 NADH LF (GAUZE/BANDAGES/DRESSINGS) IMPLANT
DRSG AQUACEL AG ADV 3.5X10 (GAUZE/BANDAGES/DRESSINGS) ×2 IMPLANT
DRSG AQUACEL AG ADV 3.5X14 (GAUZE/BANDAGES/DRESSINGS) ×2 IMPLANT
DRSG AQUACEL AG SP 3.5X10 (GAUZE/BANDAGES/DRESSINGS)
DRSG PAD ABDOMINAL 8X10 ST (GAUZE/BANDAGES/DRESSINGS) IMPLANT
DRSG TEGADERM 4X4.75 (GAUZE/BANDAGES/DRESSINGS) IMPLANT
DURAPREP 26ML APPLICATOR (WOUND CARE) ×4 IMPLANT
ELECT REM PT RETURN 9FT ADLT (ELECTROSURGICAL) ×2
ELECTRODE REM PT RTRN 9FT ADLT (ELECTROSURGICAL) ×1 IMPLANT
FEMORAL ADAPTER (Orthopedic Implant) ×2 IMPLANT
FEMUR LFT TC3 REVISION (Knees) ×2 IMPLANT
GAUZE SPONGE 2X2 8PLY STRL LF (GAUZE/BANDAGES/DRESSINGS) IMPLANT
GAUZE SPONGE 4X4 12PLY STRL (GAUZE/BANDAGES/DRESSINGS) IMPLANT
GLOVE BIOGEL PI IND STRL 6.5 (GLOVE) ×1 IMPLANT
GLOVE BIOGEL PI IND STRL 7.5 (GLOVE) ×4 IMPLANT
GLOVE BIOGEL PI IND STRL 8 (GLOVE) ×1 IMPLANT
GLOVE BIOGEL PI IND STRL 8.5 (GLOVE) ×1 IMPLANT
GLOVE BIOGEL PI INDICATOR 6.5 (GLOVE) ×1
GLOVE BIOGEL PI INDICATOR 7.5 (GLOVE) ×4
GLOVE BIOGEL PI INDICATOR 8 (GLOVE) ×1
GLOVE BIOGEL PI INDICATOR 8.5 (GLOVE) ×1
GLOVE ECLIPSE 8.0 STRL XLNG CF (GLOVE) ×4 IMPLANT
GLOVE ORTHO TXT STRL SZ7.5 (GLOVE) ×2 IMPLANT
GLOVE SURG SS PI 6.5 STRL IVOR (GLOVE) ×2 IMPLANT
GLOVE SURG SS PI 7.5 STRL IVOR (GLOVE) ×2 IMPLANT
GOWN STRL REUS W/TWL LRG LVL3 (GOWN DISPOSABLE) ×4 IMPLANT
GOWN STRL REUS W/TWL XL LVL3 (GOWN DISPOSABLE) ×4 IMPLANT
HANDPIECE INTERPULSE COAX TIP (DISPOSABLE) ×1
HOVERMATT HALF SINGLE USE (PATIENT TRANSFER) ×2 IMPLANT
IMMOBILIZER KNEE 20 (SOFTGOODS)
IMMOBILIZER KNEE 20 THIGH 36 (SOFTGOODS) IMPLANT
INSERT SZ 5 22.5 (Knees) ×2 IMPLANT
MANIFOLD NEPTUNE II (INSTRUMENTS) ×2 IMPLANT
PACK TOTAL KNEE CUSTOM (KITS) ×2 IMPLANT
PADDING CAST COTTON 6X4 STRL (CAST SUPPLIES) IMPLANT
POSITIONER SURGICAL ARM (MISCELLANEOUS) ×2 IMPLANT
SET HNDPC FAN SPRY TIP SCT (DISPOSABLE) ×1 IMPLANT
SET PAD KNEE POSITIONER (MISCELLANEOUS) ×2 IMPLANT
SPONGE GAUZE 2X2 STER 10/PKG (GAUZE/BANDAGES/DRESSINGS)
SPONGE LAP 18X18 X RAY DECT (DISPOSABLE) IMPLANT
STAPLER VISISTAT 35W (STAPLE) IMPLANT
SUT MNCRL AB 3-0 PS2 18 (SUTURE) ×2 IMPLANT
SUT VIC AB 1 CT1 36 (SUTURE) ×4 IMPLANT
SUT VIC AB 2-0 CT1 27 (SUTURE) ×6
SUT VIC AB 2-0 CT1 TAPERPNT 27 (SUTURE) ×3 IMPLANT
SUT VLOC 180 0 24IN GS25 (SUTURE) ×4 IMPLANT
SYR 50ML LL SCALE MARK (SYRINGE) ×2 IMPLANT
TOWER CARTRIDGE SMART MIX (DISPOSABLE) ×2 IMPLANT
TRAY FOLEY W/METER SILVER 16FR (SET/KITS/TRAYS/PACK) ×2 IMPLANT
TUBE KAMVAC SUCTION (TUBING) IMPLANT
WEDGE LEFT SIZE 54MM (Knees) ×2 IMPLANT
WRAP KNEE MAXI GEL POST OP (GAUZE/BANDAGES/DRESSINGS) ×2 IMPLANT
YANKAUER SUCT BULB TIP 10FT TU (MISCELLANEOUS) ×2 IMPLANT

## 2015-12-15 NOTE — Progress Notes (Signed)
Assisted Dr. Jenita Seashore with left, ultrasound guided, adductor canal block. Side rails up, monitors on throughout procedure. See vital signs in flow sheet. Tolerated Procedure well.

## 2015-12-15 NOTE — Anesthesia Procedure Notes (Signed)
Anesthesia Regional Block:  Adductor canal block  Pre-Anesthetic Checklist: ,, timeout performed, Correct Patient, Correct Site, Correct Laterality, Correct Procedure, Correct Position, site marked, Risks and benefits discussed,  Surgical consent,  Pre-op evaluation,  At surgeon's request and post-op pain management  Laterality: Left and Lower  Prep: chloraprep       Needles:  Injection technique: Single-shot  Needle Type: Echogenic Needle     Needle Length: 9cm 9 cm Needle Gauge: 22 and 22 G    Additional Needles:  Procedures: ultrasound guided (picture in chart) Adductor canal block Narrative:  Start time: 12/15/2015 12:20 PM End time: 12/15/2015 12:33 PM Injection made incrementally with aspirations every 5 mL.  Performed by: Personally  Anesthesiologist: Glennon Mac, Atira Borello  Additional Notes: Pt identified in Holding room.  Monitors applied. Working IV access confirmed. Sterile prep, drape L thigh.  #22ga insulated needle into adductor canal with US guidance.  30cc 0.5% Bupivacaine with 1:200k epi injected incrementally after negative test dose.  Patient asymptomatic, VSS, no heme aspirated, tolerated well.

## 2015-12-15 NOTE — Brief Op Note (Signed)
12/15/2015  12:50 PM  PATIENT:  [name]  65 y.o. male  PRE-OPERATIVE DIAGNOSIS:  FAILED LEFT TOTAL KNEE Replacement  POST-OPERATIVE DIAGNOSIS:  FAILED LEFT TOTAL KNEE Replacement  PROCEDURE:  Procedure(s): TOTAL KNEE REVISION REPLACEMENT (Left)  SURGEON:  Surgeon(s) and Role:    * Paralee Cancel, MD - Primary  PHYSICIAN ASSISTANT:  Molli Barrows, PA-C  ANESTHESIA:   regional and general  EBL:  No intake/output data recorded.  BLOOD ADMINISTERED:none  DRAINS: none   LOCAL MEDICATIONS USED:  MARCAINE     SPECIMEN:  Source of Specimen:  Left knee components  DISPOSITION OF SPECIMEN:  PATHOLOGY  COUNTS:  YES  TOURNIQUET:  60 min @300mmHg   DICTATION: .Other Dictation: Dictation Number [id number]  PLAN OF CARE: Admit to inpatient   PATIENT DISPOSITION:  PACU - hemodynamically stable.   Delay start of Pharmacological VTE agent (>24hrs) due to surgical blood loss or risk of bleeding: no

## 2015-12-15 NOTE — Anesthesia Preprocedure Evaluation (Addendum)
Anesthesia Evaluation  Patient identified by MRN, date of birth, ID band Patient awake    Reviewed: Allergy & Precautions, NPO status , Patient's Chart, lab work & pertinent test results, reviewed documented beta blocker date and time   History of Anesthesia Complications (+) PROLONGED EMERGENCE  Airway Mallampati: I  TM Distance: >3 FB Neck ROM: Full    Dental  (+) Dental Advisory Given, Missing   Pulmonary sleep apnea and Continuous Positive Airway Pressure Ventilation ,    breath sounds clear to auscultation       Cardiovascular hypertension, Pt. on home beta blockers and Pt. on medications + dysrhythmias Atrial Fibrillation  Rhythm:Regular Rate:Normal  '14 ECHO: EF 50-55%, valves OK   Neuro/Psych negative neurological ROS     GI/Hepatic Neg liver ROS, GERD  Medicated and Controlled,  Endo/Other  Hypothyroidism Morbid obesity  Renal/GU      Musculoskeletal  (+) Arthritis ,   Abdominal (+) + obese,   Peds  Hematology  (+) Blood dyscrasia (xarelto: last taken 11/2), ,   Anesthesia Other Findings   Reproductive/Obstetrics                            Anesthesia Physical Anesthesia Plan  ASA: III  Anesthesia Plan: General   Post-op Pain Management:    Induction: Intravenous  Airway Management Planned: Oral ETT  Additional Equipment:   Intra-op Plan:   Post-operative Plan: Extubation in OR  Informed Consent: I have reviewed the patients History and Physical, chart, labs and discussed the procedure including the risks, benefits and alternatives for the proposed anesthesia with the patient or authorized representative who has indicated his/her understanding and acceptance.   Dental advisory given  Plan Discussed with: CRNA and Surgeon  Anesthesia Plan Comments: (Plan routine monitors, GETA)        Anesthesia Quick Evaluation

## 2015-12-15 NOTE — Transfer of Care (Signed)
Immediate Anesthesia Transfer of Care Note  Patient: [name]  Procedure(s) Performed: Procedure(s) with comments: TOTAL KNEE REVISION REPLACEMENT (Left) - Adductor Block  Patient Location: PACU  Anesthesia Type:General  Level of Consciousness:  sedated, patient cooperative and responds to stimulation  Airway & Oxygen Therapy:Patient Spontanous Breathing and Patient connected to face mask oxgen  Post-op Assessment:  Report given to PACU RN and Post -op Vital signs reviewed and stable  Post vital signs:  Reviewed and stable  Last Vitals:  Vitals:   12/15/15 1235 12/15/15 1236  BP:  123/88  Pulse: 64 85  Resp:    Temp:      Complications: No apparent anesthesia complications

## 2015-12-15 NOTE — Anesthesia Procedure Notes (Signed)
Procedure Name: Intubation Date/Time: 12/15/2015 12:52 PM Performed by: Kyung Rudd Pre-anesthesia Checklist: Patient identified, Emergency Drugs available, Suction available and Patient being monitored Patient Re-evaluated:Patient Re-evaluated prior to inductionOxygen Delivery Method: Circle system utilized Preoxygenation: Pre-oxygenation with 100% oxygen Intubation Type: IV induction and Rapid sequence Laryngoscope Size: Mac and 4 Grade View: Grade I Tube type: Oral Tube size: 7.5 mm Number of attempts: 1 Airway Equipment and Method: Stylet Placement Confirmation: ETT inserted through vocal cords under direct vision,  positive ETCO2 and breath sounds checked- equal and bilateral Secured at: 21 cm Tube secured with: Tape Dental Injury: Teeth and Oropharynx as per pre-operative assessment

## 2015-12-15 NOTE — Anesthesia Procedure Notes (Addendum)
Anesthesia Procedure Image      US guided adductor canal block

## 2015-12-15 NOTE — Interval H&P Note (Signed)
History and Physical Interval Note:  12/15/2015 11:36 AM  [name]  has presented today for surgery, with the diagnosis of FAILED LEFT TOTAL KNEE  The various methods of treatment have been discussed with the patient and family. After consideration of risks, benefits and other options for treatment, the patient has consented to  Procedure(s): TOTAL KNEE REVISION REPLACEMENT (Left) as a surgical intervention .  The patient's history has been reviewed, patient examined, no change in status, stable for surgery.  I have reviewed the patient's chart and labs.  Questions were answered to the patient's satisfaction.     Mauri Pole

## 2015-12-15 NOTE — Anesthesia Postprocedure Evaluation (Signed)
Anesthesia Post Note  Patient: [name]  Procedure(s) Performed: Procedure(s) (LRB): TOTAL KNEE REVISION REPLACEMENT (Left)  Patient location during evaluation: PACU Anesthesia Type: General and Regional Level of consciousness: awake and alert, oriented and patient cooperative Pain management: pain level controlled Vital Signs Assessment: post-procedure vital signs reviewed and stable Respiratory status: spontaneous breathing, nonlabored ventilation, respiratory function stable and patient connected to nasal cannula oxygen Cardiovascular status: blood pressure returned to baseline and stable Postop Assessment: no signs of nausea or vomiting Anesthetic complications: no    Last Vitals:  Vitals:   12/15/15 1635 12/15/15 1647  BP: (!) 129/92 (!) 150/80  Pulse: 76   Resp: 20   Temp:  36.4 C    Last Pain:  Vitals:   12/15/15 1651  TempSrc:   PainSc: 3                  Fujiko Picazo,E. Con Arganbright

## 2015-12-16 LAB — CBC
HCT: 35.7 % — ABNORMAL LOW (ref 39.0–52.0)
Hemoglobin: 12 g/dL — ABNORMAL LOW (ref 13.0–17.0)
MCH: 31.3 pg (ref 26.0–34.0)
MCHC: 33.6 g/dL (ref 30.0–36.0)
MCV: 93 fL (ref 78.0–100.0)
PLATELETS: 204 10*3/uL (ref 150–400)
RBC: 3.84 MIL/uL — AB (ref 4.22–5.81)
RDW: 13.5 % (ref 11.5–15.5)
WBC: 6.6 10*3/uL (ref 4.0–10.5)

## 2015-12-16 LAB — BASIC METABOLIC PANEL
Anion gap: 6 (ref 5–15)
BUN: 19 mg/dL (ref 6–20)
CO2: 23 mmol/L (ref 22–32)
CREATININE: 0.92 mg/dL (ref 0.61–1.24)
Calcium: 9.5 mg/dL (ref 8.9–10.3)
Chloride: 108 mmol/L (ref 101–111)
GFR calc Af Amer: >60 mL/min (ref >60)
Glucose, Bld: 139 mg/dL — ABNORMAL HIGH (ref 65–99)
POTASSIUM: 4.3 mmol/L (ref 3.5–5.1)
SODIUM: 137 mmol/L (ref 135–145)

## 2015-12-16 MED ORDER — METHOCARBAMOL 500 MG PO TABS: 500.0000 mg | ORAL_TABLET | Freq: Four times a day (QID) | ORAL | 0 refills | Status: DC | PRN

## 2015-12-16 MED ORDER — DOCUSATE SODIUM 100 MG PO CAPS: 100.0000 mg | ORAL_CAPSULE | Freq: Two times a day (BID) | ORAL | 0 refills | Status: DC

## 2015-12-16 MED ORDER — POLYETHYLENE GLYCOL 3350 17 G PO PACK: 17.0000 g | PACK | Freq: Two times a day (BID) | ORAL | 0 refills | Status: DC

## 2015-12-16 MED ORDER — HYDROCODONE-ACETAMINOPHEN 7.5-325 MG PO TABS: 1.0000 | ORAL_TABLET | ORAL | 0 refills | Status: DC | PRN

## 2015-12-16 MED ORDER — FERROUS SULFATE 325 (65 FE) MG PO TABS: 325.0000 mg | ORAL_TABLET | Freq: Three times a day (TID) | ORAL | 3 refills | Status: DC

## 2015-12-16 NOTE — Progress Notes (Signed)
   12/16/15 1400  PT Visit Information  Last PT Received On 12/16/15  Assistance Needed +1  History of Present Illness s/p L TKA revision; PMHx: 13 knee surgeries, morbid obesity  Subjective Data  Patient Stated Goal home today  Precautions  Precautions Fall;Knee  Restrictions  Weight Bearing Restrictions No  LLE Weight Bearing WBAT  Pain Assessment  Pain Assessment 0-10  Pain Score 2  Pain Location L knee  Pain Descriptors / Indicators Sore  Pain Intervention(s) Limited activity within patient's tolerance;Monitored during session;Premedicated before session  Cognition  Arousal/Alertness Awake/alert  Behavior During Therapy WFL for tasks assessed/performed  Overall Cognitive Status Within Functional Limits for tasks assessed  Bed Mobility  General bed mobility comments NT -- pt in chair  Transfers  Overall transfer level Needs assistance  Equipment used Rolling walker (2 wheeled)  Transfers Sit to/from Stand  Sit to Stand Supervision  General transfer comment for safety  Ambulation/Gait  Ambulation/Gait assistance Supervision  Ambulation Distance (Feet) 30 Feet  Assistive device Rolling walker (2 wheeled)  Gait Pattern/deviations Step-to pattern;Wide base of support;Step-through pattern  General Gait Details cues for RW technique  Stairs Yes  Stairs assistance Min guard  Stair Management One rail Right;Step to pattern;Forwards  Number of Stairs 2 (x2)  General stair comments cues for sequence and safe technique  Total Joint Exercises  Ankle Circles/Pumps (pt feels he is I with HEP from previous surgeries)  PT - End of Session  Activity Tolerance Patient tolerated treatment well  Patient left in chair;with call bell/phone within reach  Nurse Communication Mobility status (ready for D/C)  PT - Assessment/Plan  PT Plan Current plan remains appropriate  PT Frequency (ACUTE ONLY) 7X/week  Follow Up Recommendations Outpatient PT  PT equipment None recommended by PT  PT  Goal Progression  Progress towards PT goals Progressing toward goals  Acute Rehab PT Goals  PT Goal Formulation With patient  Time For Goal Achievement 12/17/15  Potential to Achieve Goals Good  PT Time Calculation  PT Start Time (ACUTE ONLY) 1359  PT Stop Time (ACUTE ONLY) 1409  PT Time Calculation (min) (ACUTE ONLY) 10 min  PT General Charges  $$ ACUTE PT VISIT 1 Procedure  PT Treatments  $Gait Training 8-22 mins

## 2015-12-16 NOTE — Op Note (Addendum)
NAMEAZIZ, CHEAIRS                 ACCOUNT NO.:  [id number]  MEDICAL RECORD NO.:  [id number]  LOCATION:  [id number]                         FACILITY:  [facility]  PHYSICIAN:  Pietro Cassis. Alvan Dame, M.D.  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  PROCEDURE:  Revision left total knee arthroplasty.  COMPONENTS USED:  A new size 5 TC3 femoral component with a +2 bolt, 5 degree adapter with a 12-mm distal lateral augment, 4-mm distal medial augment, and a 22.5 mm TC3 insert.  SURGEON:  Pietro Cassis. Alvan Dame, M.D.  ASSISTANT:  Judith Part. Chabon, PA-C.  Note that, Mr. Margit Banda was present for the entirety of the case from preoperative positioning, perioperative management of the operative extremity, general facilitation of the case, and primary wound closure.  ANESTHESIA:  Preoperative adductor canal block plus general anesthetic.  SPECIMENS:  The patient's left knee components were sent to Pathology for evaluation prior to returning to [place].  BLOOD LOSS:  Probably 150 mL.  TOURNIQUET TIME:  60 minutes at 300 mmHg.  COMPLICATION:  None apparent.  INDICATIONS FOR PROCEDURE:  Mr. Lookabill is a 65 year old male, super obese patient with a known weight of 170 kg.  He is a patient of mine from previous revision surgery due of aseptic failure of prior index total knee arthroplasty.  He has done fairly well over the past 3 years until recently having increasing pain and mechanical based symptoms. Radiographs revealed concern for loose femoral component and dissociation or breakage of the TC3 revision knee component at the +2 bolt into the adapter.  This  represents an extremely unique problem as there was only 1 other reported incidences in the literature.  I had a lengthy discussion regarding pros and cons.  We ruled out infection with a sedimentation rate and C-reactive protein.  We discussed the risks of doing this type of surgery given this setting. I reviewed the risks of infection, DVT, reviewed the risks of this happening again.  Preoperative planning was extensive and is noted to be an off-label option either converting him back to a TC3 component or a hinge type component.  The remainder of his components appear to be stable.  Consent was obtained for management of this problem.  PROCEDURE IN DETAIL:  The patient was brought to the operative theater. Once adequate anesthesia, preoperative antibiotics, Ancef administered as well as Decadron, he was positioned supine with a thigh tourniquet placed.  The left lower extremity is placed in [facility] leg holder.  Left lower extremity was then prepped and draped in sterile fashion.  Time- out was performed identifying the patient, planned procedure, extremity. The leg was exsanguinated.  Tourniquet elevated to 300 mmHg.  His old incision was excised.  Soft tissue planes created.  Median arthrotomy was made encountering clear synovial fluid.  After initial exposure of the medial and lateral  gutters, we identified the femoral component, it was grossly loose, and is anticipated it and the old polyethylene removed.  After further debridement of the distal aspect of the femur, removing all nonviable soft tissue off the bone.  Attention now was directed removing the broken components from the knee.  The +2 bolt end piece was easily removed.  We were fortunate enough and able to unscrew the bolt portion from the inside of the adapter.  We were then able to fit a screwed in adapter to the adapter itself utilizing the Winquist system and then after several back slapping, we were able to  remove this adapter.  At this point, based on preoperative planning and a little known information on how to proceed, we constructed the same femoral component that was removed in mix cement.  Once the cement was mixed, we coated the inside part of the component. I then impacted with a 5 pound mallet, this new construct with the new adapter into the previously placed 40 mm press-fit sleeve.  Once this was done, we had irrigated the knee throughout the case again at this point.  I reapproximated extensor mechanism using #1 Vicryl and 0 V-Loc suture.  The remaining wound was closed with 2-0 Vicryl and a running 3-0 Monocryl.  The knee was then cleaned, dried, and dressed sterilely using surgical glue and Aquacel dressing.  He was then brought to the recovery room in stable condition and tolerating the procedure well.  Findings reviewed with the patient later in the perioperative time as well as the family.  The intra-operative findings represent fatigue failure of the implant after aseptic loosening of the femoral component.  Patient will be counseled about weight loss and the effects of his weight on the potential for future failure of this construct!     Pietro Cassis Alvan Dame, M.D.     MDO/MEDQ  D:  12/15/2015  T:  12/16/2015  Job:  [id number]

## 2015-12-16 NOTE — Progress Notes (Signed)
RN called RT about patient wanting his EPAP increased due to the pulse oximeter going off. RT increased EPAP from 7 to 8 and explained to him that we need to increase his oxygen due to his sats dropping. Increased his oxygen from 2 to 3liters. RN called again with the same problem. Patient is now at 6 l oxygen through through CPAP.RT will continue to monitor.

## 2015-12-16 NOTE — Progress Notes (Signed)
RN reviewed discharge instructions with patient. All questions answered.   Paperwork and prescriptions given.   NT rolled patient down with all belongings to family car.  

## 2015-12-16 NOTE — Discharge Instructions (Signed)

## 2015-12-16 NOTE — Evaluation (Signed)
Physical Therapy Evaluation Patient Details Name: [name] MRN: [id number] DOB: 1950/03/20 Today's Date: 12/16/2015   History of Present Illness  s/p L TKA revision; PMHx: 13 knee surgeries, morbid obesity  Clinical Impression  Pt is s/p TKA resulting in the deficits listed below (see PT Problem List). * Pt will benefit from skilled PT to increase their independence and safety with mobility to allow discharge to the venue listed below.      Follow Up Recommendations Outpatient PT    Equipment Recommendations  None recommended by PT    Recommendations for Other Services       Precautions / Restrictions Precautions Precautions: Fall;Knee Restrictions Weight Bearing Restrictions: No LLE Weight Bearing: Weight bearing as tolerated      Mobility  Bed Mobility               General bed mobility comments: NT -- pt in chair  Transfers Overall transfer level: Needs assistance Equipment used: Rolling walker (2 wheeled) Transfers: Sit to/from Stand Sit to Stand: Supervision         General transfer comment: for safety  Ambulation/Gait Ambulation/Gait assistance: Supervision Ambulation Distance (Feet): 160 Feet Assistive device: Rolling walker (2 wheeled) Gait Pattern/deviations: Step-through pattern     General Gait Details: cues for RW technique  Stairs            Wheelchair Mobility    Modified Rankin (Stroke Patients Only)       Balance                                             Pertinent Vitals/Pain Pain Assessment: 0-10 Pain Score: 3  Pain Location: L knee Pain Descriptors / Indicators: Sore Pain Intervention(s): Limited activity within patient's tolerance;Monitored during session;Premedicated before session;Ice applied    Home Living Family/patient expects to be discharged to:: Private residence Living Arrangements: Alone   Type of Home: House Home Access: Stairs to enter Entrance Stairs-Rails:  Right Entrance Stairs-Number of Steps: 2 and 1/2 Home Layout: One level Home Equipment: Walker - 2 wheels;Walker - 4 wheels;Cane - single point;Grab bars - toilet;Grab bars - tub/shower;Shower seat (same rail works for both [employer])      Prior Function Level of Independence: Independent;Independent with assistive device(s)         Comments: amb with cane or crutches     Hand Dominance        Extremity/Trunk Assessment   Upper Extremity Assessment: Defer to OT evaluation           Lower Extremity Assessment: LLE deficits/detail   LLE Deficits / Details: ankle WFL, knee extension and hip flexion grossly 3/5     Communication   Communication: No difficulties  Cognition Arousal/Alertness: Awake/alert Behavior During Therapy: WFL for tasks assessed/performed Overall Cognitive Status: Within Functional Limits for tasks assessed                      General Comments      Exercises Total Joint Exercises Ankle Circles/Pumps: AROM;Both;10 reps   Assessment/Plan    PT Assessment Patient needs continued PT services  PT Problem List Decreased strength;Decreased range of motion;Decreased activity tolerance;Decreased mobility;Pain          PT Treatment Interventions DME instruction;Gait training;Stair training;Therapeutic exercise;Patient/family education    PT Goals (Current goals can be found in the Care Plan  section)  Acute Rehab PT Goals Patient Stated Goal: home today PT Goal Formulation: With patient Time For Goal Achievement: 12/17/15 Potential to Achieve Goals: Good    Frequency 7X/week   Barriers to discharge        Co-evaluation               End of Session Equipment Utilized During Treatment: Gait belt Activity Tolerance: Patient tolerated treatment well Patient left: in chair;with call bell/phone within reach;with chair alarm set Nurse Communication: Mobility status         Time: 1100-1117 PT Time Calculation  (min) (ACUTE ONLY): 17 min   Charges:   PT Evaluation $PT Eval Low Complexity: 1 Procedure     PT G Codes:        Twan Harkin December 17, 2015, 12:01 PM

## 2015-12-16 NOTE — Addendum Note (Signed)
Addendum  created 12/16/15 0657 by Lollie Sails, CRNA   Charge Capture section accepted, Visit diagnoses modified

## 2015-12-16 NOTE — Progress Notes (Signed)
     Subjective: 1 Day Post-Op Procedure(s) (LRB): TOTAL KNEE REVISION REPLACEMENT (Left)    Seen with Dr. Alvan Dame. Patient reports pain as mild, pain controlled. No events throughout the night.  The procedure was discussed with the patient.  It has been stressed to him regarding losing weight so that the knee replacement won't fail again.  Ready to be discharged home if he does well with PT.    Objective:   VITALS:   Vitals:   12/16/15 0120 12/16/15 0602  BP: 116/70 120/70  Pulse: 71 84  Resp: 18 16  Temp: 97.7 F (36.5 C) 98.3 F (36.8 C)    Dorsiflexion/Plantar flexion intact Incision: dressing C/D/I No cellulitis present Compartment soft  LABS  Recent Labs  12/16/15 0411  HGB 12.0*  HCT 35.7*  WBC 6.6  PLT 204     Recent Labs  12/16/15 0411  NA 137  K 4.3  BUN 19  CREATININE 0.92  GLUCOSE 139*     Assessment/Plan: 1 Day Post-Op Procedure(s) (LRB): TOTAL KNEE REVISION REPLACEMENT (Left) Foley cath d/c'ed Advance diet Up with therapy D/C IV fluids Discharge home Follow up in 2 weeks at [facility]. Follow up with OLIN,Elleen Coulibaly D in 2 weeks.  Contact information:  [facility] [address], [place] [address] [phone]    Morbid Obesity (BMI >40)  Estimated body mass index is 57.02 kg/m as calculated from the following:   Height as of this encounter: 5\' 8"  (1.727 m).   Weight as of this encounter: 170.1 kg (375 lb). Patient also counseled that weight may inhibit the healing process Patient counseled that losing weight will help with future health issues      West Pugh. Jakevious Hollister   PAC  12/16/2015, 8:04 AM

## 2015-12-16 NOTE — Progress Notes (Signed)
CSW consulted for SNF placement. PN reviewed. Pt plans to return home at d/c. [facility] assisting with d/c planning.   CSW signing off.  Werner Lean LCSW [phone]

## 2015-12-16 NOTE — Progress Notes (Signed)
OT Cancellation Note  Patient Details Name: [name] MRN: [id number] DOB: 1950/04/07   Cancelled Treatment:    Reason Eval/Treat Not Completed: OT screened, no needs identified, will sign off.  Pt has had many knee sxs.  No OT needs.  [name] 12/16/2015, 2:10 PM  Lesle Chris, OTR/L [phone] 12/16/2015

## 2015-12-16 NOTE — Care Management Note (Signed)
Case Management Note  Patient Details  Name: [name] MRN: [id number] Date of Birth: 1950-03-16  Subjective/Objective:                  L TKA revision Action/Plan: Discharge planning Expected Discharge Date:  12/16/15               Expected Discharge Plan:  Home/Self Care  In-House Referral:     Discharge planning Services  CM Consult  Post Acute Care Choice:    Choice offered to:  Patient  DME Arranged:  N/A DME Agency:  NA  HH Arranged:  NA HH Agency:  NA  Status of Service:  Completed, signed off  If discussed at [place] of Stay Meetings, dates discussed:    Additional Comments: CM met with pt who declines HHPT and requests outpt PT.  Charge RN called MD and pt understands he will call the MD office and request an outpt PT appt ASAP.  Pt is requesting Wilhemina Bonito and CM encouraged him to specifically request this therapist.   Dellie Catholic, RN 12/16/2015, 12:29 PM

## 2015-12-24 NOTE — Discharge Summary (Signed)
Physician Discharge Summary  Patient ID: [name] MRN: [id number] DOB/AGE: Aug 17, 1950 65 y.o.  Admit date: 12/15/2015 Discharge date: 12/16/2015   Procedures:  Procedure(s) (LRB): TOTAL KNEE REVISION REPLACEMENT (Left)  Attending Physician:  Dr. Paralee Cancel   Admission Diagnoses:   Left knee pain, s/p TKA  Discharge Diagnoses:  Principal Problem:   S/P rev left TK  Past Medical History:  Diagnosis Date  . CHF (congestive heart failure) ([place])   . Chronic anticoagulation    with Xarelto  . Complication of anesthesia    PT STATES HARD TO WAKE UP AFTER ONE SUGERY -STATES THE SURGERY TOOK LONGER THAN EXPECTED.  NO PROBLEMS WITH ANY OTHER SURGERY  . Dysrhythmia    A-fib  . GERD (gastroesophageal reflux disease)   . Headache   . History of blood clots   . History of blood transfusion   . Hypothyroidism   . Pain    BACK PAIN - PT ATTRIBUTES TO THE WAY HE WALKS DUE TO LEFT KNEE PROBLEM  . Persistent atrial fibrillation ([place])   . Septic arthritis of knee (H[facility])    LEFT KNEE  . Shortness of breath    WITH EXERTION AND PAIN  . Sleep apnea    uses CPAP    HPI:    [name], 65 y.o. male, has a history of pain and functional disability in the left knee(s) due to failed previous arthroplasty and patient has failed non-surgical conservative treatments for greater than 12 weeks to include NSAID's and/or analgesics, use of assistive devices and activity modification. The indications for the revision of the total knee arthroplasty are loosening of one or more components and fracture or mechanical failure of one or components. Onset of symptoms was gradual starting a couple months ago with rapidlly worsening course since that time.  Prior procedures on the left knee(s) include arthroplasty and revision. Patient currently rates pain in the left knee(s) at 10 out of 10 with activity. There is night pain, worsening of pain with activity and weight bearing, pain that interferes with  activities of daily living, pain with passive range of motion, crepitus and joint swelling.  Patient has evidence of prosthetic loosening by imaging studies. This condition presents safety issues increasing the risk of falls.  There is no current active infection.  Risks, benefits and expectations were discussed with the patient.  Risks including but not limited to the risk of anesthesia, blood clots, nerve damage, blood vessel damage, failure of the prosthesis, infection and up to and including death.  Patient understand the risks, benefits and expectations and wishes to proceed with surgery.   PCP: Garrett Inches, MD   Discharged Condition: good  Hospital Course:  Patient underwent the above stated procedure on 12/15/2015. Patient tolerated the procedure well and brought to the recovery room in good condition and subsequently to the floor.  POD #1 BP: 120/70 ; Pulse: 84 ; Temp: 98.3 F (36.8 C) ; Resp: 16 Patient reports pain as mild, pain controlled. No events throughout the night.  The procedure was discussed with the patient.  It has been stressed to him regarding losing weight so that the knee replacement won't fail again.  Ready to be discharged home. Dorsiflexion/plantar flexion intact, incision: dressing C/D/I, no cellulitis present and compartment soft.   LABS  Basename    HGB     12.0  HCT     35.7    Discharge Exam: General appearance: alert, cooperative and no distress Extremities: [employer]  sign is negative, no sign of DVT, no edema, redness or tenderness in the calves or thighs and no ulcers, gangrene or trophic changes  Disposition: Home with follow up in 2 weeks   Follow-up Information    Mauri Pole, MD. Schedule an appointment as soon as possible for a visit in 2 week(s).   Specialty:  Orthopedic Surgery Why:  Call the office today and request Wilhemina Bonito for outpt PT (to begin immediately)  and schedule a surgical follow up appointment for 2 weeks from  now Contact information: [address] [place] [address] [phone]           Discharge Instructions    Call MD / Call 911    Complete by:  As directed    If you experience chest pain or shortness of breath, CALL 911 and be transported to the hospital emergency room.  If you develope a fever above 101 F, pus (white drainage) or increased drainage or redness at the wound, or calf pain, call your surgeon's office.   Change dressing    Complete by:  As directed    Maintain surgical dressing until follow up in the clinic. If the edges start to pull up, may reinforce with tape. If the dressing is no longer working, may remove and cover with gauze and tape, but must keep the area dry and clean.  Call with any questions or concerns.   Constipation Prevention    Complete by:  As directed    Drink plenty of fluids.  Prune juice may be helpful.  You may use a stool softener, such as Colace (over the counter) 100 mg twice a day.  Use MiraLax (over the counter) for constipation as needed.   Diet - low sodium heart healthy    Complete by:  As directed    Discharge instructions    Complete by:  As directed    Maintain surgical dressing until follow up in the clinic. If the edges start to pull up, may reinforce with tape. If the dressing is no longer working, may remove and cover with gauze and tape, but must keep the area dry and clean.  Follow up in 2 weeks at [facility]. Call with any questions or concerns.   Increase activity slowly as tolerated    Complete by:  As directed    Weight bearing as tolerated with assist device (walker, cane, etc) as directed, use it as long as suggested by your surgeon or therapist, typically at least 4-6 weeks.   TED hose    Complete by:  As directed    Use stockings (TED hose) for 2 weeks on both leg(s).  You may remove them at night for sleeping.        Medication List    STOP taking these medications   DIGOX 0.125 MG  tablet Generic drug:  digoxin     TAKE these medications   diltiazem 240 MG 24 hr capsule Commonly known as:  CARDIZEM CD TAKE 1 CAPSULE BY MOUTH ONCE DAILY   docusate sodium 100 MG capsule Commonly known as:  COLACE Take 1 capsule (100 mg total) by mouth 2 (two) times daily.   ferrous sulfate 325 (65 FE) MG tablet Take 1 tablet (325 mg total) by mouth 3 (three) times daily after meals.   HYDROcodone-acetaminophen 7.5-325 MG tablet Commonly known as:  NORCO Take 1-2 tablets by mouth every 4 (four) hours as needed for moderate pain.   levothyroxine 112 MCG  tablet Commonly known as:  SYNTHROID, LEVOTHROID Take 112 mcg by mouth daily.   methocarbamol 500 MG tablet Commonly known as:  ROBAXIN Take 1 tablet (500 mg total) by mouth every 6 (six) hours as needed for muscle spasms.   metoprolol 50 MG tablet Commonly known as:  LOPRESSOR TAKE ONE TABLET BY MOUTH TWICE DAILY   multivitamin with minerals Tabs tablet Take 1 tablet by mouth daily.   pantoprazole 40 MG tablet Commonly known as:  PROTONIX TAKE 1 TABLET BY MOUTH AT 12 NOON   polyethylene glycol packet Commonly known as:  MIRALAX / GLYCOLAX Take 17 g by mouth 2 (two) times daily.   XARELTO 20 MG Tabs tablet Generic drug:  rivaroxaban TAKE 1 TABLET BY MOUTH DAILY WITH BREAKFAST        Signed: West Pugh. Shiesha Jahn   PA-C  12/24/2015, 4:55 PM

## 2016-01-21 ENCOUNTER — Other Ambulatory Visit: Payer: Self-pay | Admitting: Cardiovascular Disease

## 2016-04-21 ENCOUNTER — Other Ambulatory Visit: Payer: Self-pay | Admitting: Cardiovascular Disease

## 2016-06-25 ENCOUNTER — Other Ambulatory Visit: Payer: Self-pay | Admitting: Cardiovascular Disease

## 2016-07-26 ENCOUNTER — Other Ambulatory Visit: Payer: Self-pay | Admitting: Cardiovascular Disease

## 2016-07-26 NOTE — Telephone Encounter (Signed)
Noted drug-drug interaction between diltiazem and xarelto (class B), no addiotional adjustment needed at this time

## 2016-08-17 ENCOUNTER — Ambulatory Visit (INDEPENDENT_AMBULATORY_CARE_PROVIDER_SITE_OTHER): Payer: Medicare Other | Admitting: Cardiovascular Disease

## 2016-08-17 ENCOUNTER — Encounter: Payer: Self-pay | Admitting: Cardiovascular Disease

## 2016-08-17 VITALS — BP 100/60 | HR 77 | Ht 68.0 in | Wt 375.0 lb

## 2016-08-17 DIAGNOSIS — Z9989 Dependence on other enabling machines and devices: Secondary | ICD-10-CM | POA: Diagnosis not present

## 2016-08-17 DIAGNOSIS — I5032 Chronic diastolic (congestive) heart failure: Secondary | ICD-10-CM | POA: Diagnosis not present

## 2016-08-17 DIAGNOSIS — Z7901 Long term (current) use of anticoagulants: Secondary | ICD-10-CM

## 2016-08-17 DIAGNOSIS — I481 Persistent atrial fibrillation: Secondary | ICD-10-CM | POA: Diagnosis not present

## 2016-08-17 DIAGNOSIS — G4733 Obstructive sleep apnea (adult) (pediatric): Secondary | ICD-10-CM

## 2016-08-17 DIAGNOSIS — E669 Obesity, unspecified: Secondary | ICD-10-CM

## 2016-08-17 DIAGNOSIS — I4819 Other persistent atrial fibrillation: Secondary | ICD-10-CM

## 2016-08-17 NOTE — Progress Notes (Signed)
Cardiology Office Note    Date:  08/17/2016   ID:  [name], DOB 1950/12/03, MRN [id number]  PCP:  Bernerd Limbo, MD  Cardiologist:   Sanda Klein, MD   Chief Complaint  Patient presents with  . Follow-up    History of Present Illness:  [name] is a 66 y.o. male who presents for permanent atrial fibrillation (previously complicated by tachycardia related cardiomyopathy and heart failure) and history of recurrent venous thromboembolic events, on a background of superobesity, sleep apnea on CPAP and multiple orthopedic problems (13 previous knee surgeries) which limit his ability to be physically active.  His most recent knee replacement revision (left) was in November 2017.  He generally feels well. He remains unaware of palpitations. He has not had any dizziness or syncope and denies dyspnea at rest, although he does have NYHA functional class II exertional dyspnea. He firmly denies orthopnea or PND. He has waxing and waning lower extremity edema, usually more prominent in the left lower extremity where he has had numerous knee surgeries and previous DVT. She takes furosemide as needed on the average 2 or 3 times a week (he does not know the dose of furosemide).  He has unchanged class II exertional dyspnea. He denies any bleeding problems. The patient specifically denies any chest pain at rest or exertion, dyspnea at rest, orthopnea, paroxysmal nocturnal dyspnea, syncope, palpitations, focal neurological deficits, intermittent claudication, unexplained weight gain, cough, hemoptysis or wheezing.  Past Medical History:  Diagnosis Date  . CHF (congestive heart failure) ([place])   . Chronic anticoagulation    with Xarelto  . Complication of anesthesia    PT STATES HARD TO WAKE UP AFTER ONE SUGERY -STATES THE SURGERY TOOK LONGER THAN EXPECTED.  NO PROBLEMS WITH ANY OTHER SURGERY  . Dysrhythmia    A-fib  . GERD (gastroesophageal reflux disease)   . Headache   . History of  blood clots   . History of blood transfusion   . Hypothyroidism   . Pain    BACK PAIN - PT ATTRIBUTES TO THE WAY HE WALKS DUE TO LEFT KNEE PROBLEM  . Persistent atrial fibrillation ([place])   . Septic arthritis of knee (H[facility])    LEFT KNEE  . Shortness of breath    WITH EXERTION AND PAIN  . Sleep apnea    uses CPAP    Past Surgical History:  Procedure Laterality Date  . 2010 REMOVAL OF LEFT TOTAL KNEE    . CARDIAC CATHETERIZATION    . CARDIOVERSION N/A 08/28/2012   Procedure: CARDIOVERSION;  Surgeon: Sanda Klein, MD;  Location: [facility];  Service: Cardiovascular;  Laterality: N/A;  . CARDIOVERSION N/A 09/01/2012   Procedure: CARDIOVERSION;  Surgeon: Pixie Casino, MD;  Location: [facility];  Service: Cardiovascular;  Laterality: N/A;  . COLONOSCOPY N/A 06/13/2013   Procedure: COLONOSCOPY;  Surgeon: Inda Castle, MD;  [facility];  Service: Endoscopy;  Laterality: N/A;  . HYDROCELECTOY   2012  . LEFT KNEE ARTHROSCOPY  1999  . LEFT KNEE SURGERY UPPER TIBIAL OSTOMY    . LEFT TOTAL KNEE REMOVAL FOR INFECTION  2006  . REIMPLANTATION LEFT TOTAL KNEE  2008  . REIMPLANTATION LEFT TOTAL KNEE   2006  . REIMPLANTATION OF TOTAL KNEE Left 06/23/2012   Procedure: REIMPLANTATION OF LEFT TOTAL KNEE;  Surgeon: Mauri Pole, MD;  [facility];  Service: Orthopedics;  Laterality: Left;  . REMOVAL LEFT TOTAL KNEE   2008  . REPLACEMENT LEFT  KNEE  2002  . REPLACEMENT RIGHT KNEE  2003  . REVISION LEFT KNEE CAP   2004  . RIGHT KNEE ARTHROSCOPY  1998  . TEE WITHOUT CARDIOVERSION N/A 08/28/2012   Procedure: TRANSESOPHAGEAL ECHOCARDIOGRAM (TEE);  Surgeon: Sanda Klein, MD;  Location: [facility];  Service: Cardiovascular;  Laterality: N/A;  . TONSILLECTOMY    . TOTAL KNEE REVISION Left 12/15/2015   Procedure: TOTAL KNEE REVISION REPLACEMENT;  Surgeon: Paralee Cancel, MD;  [facility];  Service: Orthopedics;  Laterality: Left;  Adductor Block    Current Medications: Outpatient Medications Prior  to Visit  Medication Sig Dispense Refill  . diltiazem (CARDIZEM CD) 240 MG 24 hr capsule TAKE 1 CAPSULE BY MOUTH DAILY 30 capsule 0  . HYDROcodone-acetaminophen (NORCO) 7.5-325 MG tablet Take 1-2 tablets by mouth every 4 (four) hours as needed for moderate pain. 60 tablet 0  . levothyroxine (SYNTHROID, LEVOTHROID) 112 MCG tablet Take 112 mcg by mouth daily.     . metoprolol (LOPRESSOR) 50 MG tablet TAKE 1 TABLET BY MOUTH TWICE DAILY 180 tablet 1  . Multiple Vitamin (MULTIVITAMIN WITH MINERALS) TABS Take 1 tablet by mouth daily.    . pantoprazole (PROTONIX) 40 MG tablet TAKE 1 TABLET BY MOUTH DAILY AT 12 NOON 30 tablet 0  . polyethylene glycol (MIRALAX / GLYCOLAX) packet Take 17 g by mouth 2 (two) times daily. (Patient not taking: Reported on 08/17/2016) 14 each 0  . XARELTO 20 MG TABS tablet TAKE 1 TABLET BY MOUTH DAILY WITH BREAKFAST 30 tablet 0  . docusate sodium (COLACE) 100 MG capsule Take 1 capsule (100 mg total) by mouth 2 (two) times daily. (Patient not taking: Reported on 08/17/2016) 10 capsule 0  . ferrous sulfate 325 (65 FE) MG tablet Take 1 tablet (325 mg total) by mouth 3 (three) times daily after meals. (Patient not taking: Reported on 08/17/2016)  3  . methocarbamol (ROBAXIN) 500 MG tablet Take 1 tablet (500 mg total) by mouth every 6 (six) hours as needed for muscle spasms. (Patient not taking: Reported on 08/17/2016) 40 tablet 0   No facility-administered medications prior to visit.      Allergies:   Morphine   Social History   Social History  . Marital status: Single    Spouse name: N/A  . Number of children: N/A  . Years of education: N/A   Social History Main Topics  . Smoking status: Never Smoker  . Smokeless tobacco: Never Used  . Alcohol use Yes     Comment: 1-2 drinks per month   . Drug use: No  . Sexual activity: Yes    Partners: Female   Other Topics Concern  . None   Social History Narrative  . None     Family History:  The patient's family history  includes Breast cancer in his sister; Cervical cancer in his sister; Diabetes in his sister; Heart disease (age of onset: 57) in his father; Liver cancer in his sister; Pancreatic cancer in his sister.   ROS:   Please see the history of present illness.    ROS All other systems reviewed and are negative.   PHYSICAL EXAM:   VS:  BP 100/60 (BP Location: Right Arm, Patient Position: Sitting, Cuff Size: Large)   Pulse 77   Ht 5\' 8"  (1.727 m)   Wt (!) 375 lb (170.1 kg)   BMI 57.02 kg/m      General: Alert, oriented x3, no distress. Superobesity limits the exam. Head: no evidence of  trauma, PERRL, EOMI, no exophtalmos or lid lag, no myxedema, no xanthelasma; normal ears, nose and oropharynx Neck: normal jugular venous pulsations and no hepatojugular reflux; brisk carotid pulses without delay and no carotid bruits Chest: clear to auscultation, no signs of consolidation by percussion or palpation, normal fremitus, symmetrical and full respiratory excursions Cardiovascular: unable to define the apical impulse, irregular rhythm, normal first and second heart sounds, no murmurs, rubs or gallops. no edema, Wearing compression stockings. Abdomen: no tenderness or distention, no masses by palpation, no abnormal pulsatility or arterial bruits, normal bowel sounds, no hepatosplenomegaly Extremities: no clubbing, cyanosis; 2+ radial, ulnar and brachial pulses bilaterally; 2+ right femoral, posterior tibial and dorsalis pedis pulses; 2+ left femoral, posterior tibial and dorsalis pedis pulses; no subclavian or femoral bruits Neurological: grossly nonfocal   Wt Readings from Last 3 Encounters:  08/17/16 (!) 375 lb (170.1 kg)  12/15/15 (!) 375 lb (170.1 kg)  12/08/15 (!) 375 lb 9 oz (170.4 kg)      Studies/Labs Reviewed:   EKG:  EKG is ordered today.  The ekg ordered today demonstrates atrial fibrillation with good rate control, nonspecific intraventricular conduction delay with QRS 120 ms, QTC 454 ms  otherwise normal tracing  Recent Labs: 12/16/2015: BUN 19; Creatinine, Ser 0.92; Hemoglobin 12.0; Platelets 204; Potassium 4.3; Sodium 137   Lipid Panel    Component Value Date/Time   CHOL 109 08/27/2012 0534   TRIG 65 08/27/2012 0534   HDL 45 08/27/2012 0534   CHOLHDL 2.4 08/27/2012 0534   VLDL 13 08/27/2012 0534   LDLCALC 51 08/27/2012 0534     ASSESSMENT:    1. Persistent atrial fibrillation ([place])   2. Chronic diastolic heart failure ([place])   3. OSA on CPAP   4. Super obesity   5. Chronic anticoagulation, with Xarelto      PLAN:  In order of problems listed above:  1. AFib: Rate control is good, even after we discontinued the digoxin last year. Important to maintain good rate control in view of history of tachycardia cardiomyopathy. CHADSVasc 2 (age, CHF). 2. CHF: Appears to be well compensated, although body habitus limits ability to estimate the volume status. 3. OSA: Reports 100% compliance with CPAP  4. Super-obesity: I'm sure this is involved in his dyspnea both directly (thoracic restriction) and via obstructive sleep apnea and pulmonary hypertension. Afraid to consider bariatric surgery due to history of recurrent postsurgical infections. He reports losing 25 pounds from his peak weight last year, but according to our scales he has only lost 13 pounds. Encouraged to keep up his efforts at weight loss. 5. Anticoagulation: Indicated for both atrial fibrillation and history of recurrent venous thromboembolic disease. No bleeding complications    Medication Adjustments/Labs and Tests Ordered: Current medicines are reviewed at length with the patient today.  Concerns regarding medicines are outlined above.  Medication changes, Labs and Tests ordered today are listed in the Patient Instructions below. There are no Patient Instructions on file for this visit.   Signed, Sanda Klein, MD  08/17/2016 8:49 AM    [facility] [place],  [place], [place]  [address] Phone: [phone]; Fax: [phone]

## 2016-08-17 NOTE — Patient Instructions (Signed)
Dr Croitoru recommends that you schedule a follow-up appointment in 12 months. You will receive a reminder letter in the mail two months in advance. If you don't receive a letter, please call our office to schedule the follow-up appointment.  If you need a refill on your cardiac medications before your next appointment, please call your pharmacy. 

## 2016-08-20 ENCOUNTER — Other Ambulatory Visit: Payer: Self-pay | Admitting: Cardiovascular Disease

## 2016-08-23 NOTE — Telephone Encounter (Signed)
REFILL 

## 2016-09-28 ENCOUNTER — Encounter: Payer: Self-pay | Admitting: Gastroenterology

## 2016-11-19 ENCOUNTER — Other Ambulatory Visit: Payer: Self-pay | Admitting: Cardiovascular Disease

## 2016-11-22 NOTE — Telephone Encounter (Signed)
Rx(s) sent to pharmacy electronically.  

## 2017-08-20 ENCOUNTER — Other Ambulatory Visit: Payer: Self-pay | Admitting: Cardiovascular Disease

## 2017-08-23 ENCOUNTER — Encounter: Payer: Self-pay | Admitting: Cardiovascular Disease

## 2017-08-23 ENCOUNTER — Ambulatory Visit: Payer: Medicare Other | Admitting: Cardiovascular Disease

## 2017-08-23 VITALS — BP 98/72 | HR 82 | Ht 68.0 in | Wt 393.8 lb

## 2017-08-23 DIAGNOSIS — I4819 Other persistent atrial fibrillation: Secondary | ICD-10-CM

## 2017-08-23 DIAGNOSIS — G4733 Obstructive sleep apnea (adult) (pediatric): Secondary | ICD-10-CM | POA: Diagnosis not present

## 2017-08-23 DIAGNOSIS — Z7901 Long term (current) use of anticoagulants: Secondary | ICD-10-CM

## 2017-08-23 DIAGNOSIS — I5032 Chronic diastolic (congestive) heart failure: Secondary | ICD-10-CM | POA: Diagnosis not present

## 2017-08-23 DIAGNOSIS — I481 Persistent atrial fibrillation: Secondary | ICD-10-CM | POA: Diagnosis not present

## 2017-08-23 DIAGNOSIS — E669 Obesity, unspecified: Secondary | ICD-10-CM

## 2017-08-23 MED ORDER — POTASSIUM CHLORIDE ER 10 MEQ PO TBCR: 10.0000 meq | EXTENDED_RELEASE_TABLET | Freq: Every day | ORAL | 3 refills | Status: DC

## 2017-08-23 MED ORDER — METOPROLOL TARTRATE 50 MG PO TABS: 50.0000 mg | ORAL_TABLET | Freq: Two times a day (BID) | ORAL | 3 refills | Status: DC

## 2017-08-23 MED ORDER — FUROSEMIDE 20 MG PO TABS: 20.0000 mg | ORAL_TABLET | Freq: Every day | ORAL | 3 refills | Status: DC | PRN

## 2017-08-23 MED ORDER — RIVAROXABAN 20 MG PO TABS: 20.0000 mg | ORAL_TABLET | Freq: Every day | ORAL | 3 refills | Status: DC

## 2017-08-23 MED ORDER — DILTIAZEM HCL ER COATED BEADS 240 MG PO CP24: 240.0000 mg | ORAL_CAPSULE | Freq: Every day | ORAL | 3 refills | Status: DC

## 2017-08-23 NOTE — Progress Notes (Signed)
Cardiology Office Note    Date:  08/23/2017   ID:  [name], DOB January 17, 1951, MRN [id number]  PCP:  Bernerd Limbo, MD  Cardiologist:   Sanda Klein, MD   Chief Complaint  Patient presents with  . Atrial Fibrillation    History of Present Illness:  [name] is a 67 y.o. male who presents for permanent atrial fibrillation (previously complicated by tachycardia related cardiomyopathy and heart failure) and history of recurrent venous thromboembolic events, on a background of superobesity, sleep apnea on CPAP and multiple orthopedic problems (13 previous knee surgeries) which limit his ability to be physically active. His most recent knee replacement revision (left) was in November 2017.  The patient specifically denies any chest pain at rest or exertion, dyspnea at rest, orthopnea, paroxysmal nocturnal dyspnea, syncope, palpitations, focal neurological deficits, intermittent claudication, bleeding problems, unexplained weight gain,retired  cough, hemoptysis or wheezing.  He has chronic bilateral calf edema despite compression stockings takes furosemide every day almost without exception.  He is not taking a potassium supplement.  His potassium level was normal when he had labs with his primary care provider in April.  However he complains of "heart muscles" in his calf intermittently, painful.  Sounds like he may be having muscle cramps.  Past Medical History:  Diagnosis Date  . CHF (congestive heart failure) ([place])   . Chronic anticoagulation    with Xarelto  . Complication of anesthesia    PT STATES HARD TO WAKE UP AFTER ONE SUGERY -STATES THE SURGERY TOOK LONGER THAN EXPECTED.  NO PROBLEMS WITH ANY OTHER SURGERY  . Dysrhythmia    A-fib  . GERD (gastroesophageal reflux disease)   . Headache   . History of blood clots   . History of blood transfusion   . Hypothyroidism   . Pain    BACK PAIN - PT ATTRIBUTES TO THE WAY HE WALKS DUE TO LEFT KNEE PROBLEM  . Persistent atrial  fibrillation ([name])   . Septic arthritis of knee (H[facility])    LEFT KNEE  . Shortness of breath    WITH EXERTION AND PAIN  . Sleep apnea    uses CPAP    Past Surgical History:  Procedure Laterality Date  . 2010 REMOVAL OF LEFT TOTAL KNEE    . CARDIAC CATHETERIZATION    . CARDIOVERSION N/A 08/28/2012   Procedure: CARDIOVERSION;  Surgeon: Sanda Klein, MD;  Location: [facility];  Service: Cardiovascular;  Laterality: N/A;  . CARDIOVERSION N/A 09/01/2012   Procedure: CARDIOVERSION;  Surgeon: Pixie Casino, MD;  Location: [facility];  Service: Cardiovascular;  Laterality: N/A;  . COLONOSCOPY N/A 06/13/2013   Procedure: COLONOSCOPY;  Surgeon: Inda Castle, MD;  [facility];  Service: Endoscopy;  Laterality: N/A;  . HYDROCELECTOY   2012  . LEFT KNEE ARTHROSCOPY  1999  . LEFT KNEE SURGERY UPPER TIBIAL OSTOMY    . LEFT TOTAL KNEE REMOVAL FOR INFECTION  2006  . REIMPLANTATION LEFT TOTAL KNEE  2008  . REIMPLANTATION LEFT TOTAL KNEE   2006  . REIMPLANTATION OF TOTAL KNEE Left 06/23/2012   Procedure: REIMPLANTATION OF LEFT TOTAL KNEE;  Surgeon: Mauri Pole, MD;  [facility];  Service: Orthopedics;  Laterality: Left;  . REMOVAL LEFT TOTAL KNEE   2008  . REPLACEMENT LEFT KNEE  2002  . REPLACEMENT RIGHT KNEE  2003  . REVISION LEFT KNEE CAP   2004  . RIGHT KNEE ARTHROSCOPY  1998  . TEE WITHOUT CARDIOVERSION N/A 08/28/2012  Procedure: TRANSESOPHAGEAL ECHOCARDIOGRAM (TEE);  Surgeon: Sanda Klein, MD;  Location: [facility];  Service: Cardiovascular;  Laterality: N/A;  . TONSILLECTOMY    . TOTAL KNEE REVISION Left 12/15/2015   Procedure: TOTAL KNEE REVISION REPLACEMENT;  Surgeon: Paralee Cancel, MD;  [facility];  Service: Orthopedics;  Laterality: Left;  Adductor Block    Current Medications: Outpatient Medications Prior to Visit  Medication Sig Dispense Refill  . levothyroxine (SYNTHROID, LEVOTHROID) 112 MCG tablet Take 112 mcg by mouth daily.     . Multiple Vitamin (MULTIVITAMIN WITH  MINERALS) TABS Take 1 tablet by mouth daily.    . pantoprazole (PROTONIX) 40 MG tablet TAKE 1 TABLET BY MOUTH DAILY AT 12 NOON (Patient taking differently: take as directed) 30 tablet 11  . diltiazem (CARDIZEM CD) 240 MG 24 hr capsule TAKE 1 CAPSULE BY MOUTH DAILY 30 capsule 0  . furosemide (LASIX) 20 MG tablet Take 20 mg by mouth as needed.    . metoprolol tartrate (LOPRESSOR) 50 MG tablet TAKE 1 TABLET BY MOUTH TWICE DAILY 180 tablet 2  . XARELTO 20 MG TABS tablet TAKE 1 TABLET BY MOUTH DAILY WITH BREAKFAST 30 tablet 0  . HYDROcodone-acetaminophen (NORCO) 7.5-325 MG tablet Take 1-2 tablets by mouth every 4 (four) hours as needed for moderate pain. 60 tablet 0  . polyethylene glycol (MIRALAX / GLYCOLAX) packet Take 17 g by mouth 2 (two) times daily. (Patient not taking: Reported on 08/17/2016) 14 each 0   No facility-administered medications prior to visit.      Allergies:   Morphine   Social History   Socioeconomic History  . Marital status: Single    Spouse name: Not on file  . Number of children: Not on file  . Years of education: Not on file  . Highest education level: Not on file  Occupational History  . Not on file  Social Needs  . Financial resource strain: Not on file  . Food insecurity:    Worry: Not on file    Inability: Not on file  . Transportation needs:    Medical: Not on file    Non-medical: Not on file  Tobacco Use  . Smoking status: Never Smoker  . Smokeless tobacco: Never Used  Substance and Sexual Activity  . Alcohol use: Yes    Comment: 1-2 drinks per month   . Drug use: No  . Sexual activity: Yes    Partners: Female  Lifestyle  . Physical activity:    Days per week: Not on file    Minutes per session: Not on file  . Stress: Not on file  Relationships  . Social connections:    Talks on phone: Not on file    Gets together: Not on file    Attends religious service: Not on file    Active member of club or organization: Not on file    Attends  meetings of clubs or organizations: Not on file    Relationship status: Not on file  Other Topics Concern  . Not on file  Social History Narrative  . Not on file     Family History:  The patient's family history includes Breast cancer in his sister; Cervical cancer in his sister; Diabetes in his sister; Heart disease (age of onset: 68) in his father; Liver cancer in his sister; Pancreatic cancer in his sister.   ROS:   Please see the history of present illness.    ROS all other systems reviewed and are negative  PHYSICAL EXAM:   VS:  BP 98/72   Pulse 82   Ht 5\' 8"  (1.727 m)   Wt (!) 393 lb 12.8 oz (178.6 kg)   BMI 59.88 kg/m      General: Alert, oriented x3, no distress, super obese Head: no evidence of trauma, PERRL, EOMI, no exophtalmos or lid lag, no myxedema, no xanthelasma; normal ears, nose and oropharynx Neck: normal jugular venous pulsations and no hepatojugular reflux; brisk carotid pulses without delay and no carotid bruits Chest: clear to auscultation, no signs of consolidation by percussion or palpation, normal fremitus, symmetrical and full respiratory excursions Cardiovascular: normal position and quality of the apical impulse, regular rhythm, normal first and second heart sounds, no murmurs, rubs or gallops Abdomen: no tenderness or distention, no masses by palpation, no abnormal pulsatility or arterial bruits, normal bowel sounds, no hepatosplenomegaly Extremities: no clubbing, cyanosis; wearing compression stockings with trivial bilateral ankle and pedal edema; 2+ radial, ulnar and brachial pulses bilaterally; unable to palpate lower extremity pulses Neurological: grossly nonfocal Psych: Normal mood and affect   Wt Readings from Last 3 Encounters:  08/23/17 (!) 393 lb 12.8 oz (178.6 kg)  08/17/16 (!) 375 lb (170.1 kg)  12/15/15 (!) 375 lb (170.1 kg)      Studies/Labs Reviewed:   EKG:  EKG is ordered today.  This shows atrial fibrillation with controlled  ventricular response and minor nonspecific intraventricular conduction delay (QRS 110 ms), QTC 443 ms  Recent Labs: May 16, 2017 potassium 4.5, creatinine 0.97, normal liver function tests, glucose 81, hemoglobin 13.2  Lipid Panel    Component Value Date/Time   CHOL 109 08/27/2012 0534   TRIG 65 08/27/2012 0534   HDL 45 08/27/2012 0534   CHOLHDL 2.4 08/27/2012 0534   VLDL 13 08/27/2012 0534   LDLCALC 51 08/27/2012 0534     ASSESSMENT:    1. Persistent atrial fibrillation ([place])   2. Chronic diastolic heart failure ([place])   3. OSA (obstructive sleep apnea)   4. Super obese   5. Long term (current) use of anticoagulants      PLAN:  In order of problems listed above:  1. AFib: On appropriate anticoagulation. CHADSVasc 2 (age, CHF).  Well rate controlled on 2 agents.  He had tachycardia related cardiomyopathy, now resolved 2. CHF: Super obesity and limited activity level will make it hard to assess his functional status but as far as I can tell he is compensated and euvolemic.  We will add a low-dose potassium supplement to see if this helps with his muscle cramps. 3. OSA: Reports 100% compliance with CPAP .  He reports good results with treatment and denies daytime hypersomnolence 4. Super-obesity: I'm sure this is involved in his dyspnea both directly (thoracic restriction) and via obstructive sleep apnea and pulmonary hypertension. Afraid to consider bariatric surgery due to history of recurrent postsurgical infections.  A year ago he was losing weight, but he has gained almost 20 pounds since I last saw him. 5. Anticoagulation: Indicated for both atrial fibrillation and history of recurrent venous thromboembolic disease. No bleeding complications.  Some difficulty with the cost of Xarelto when he is in the "donut hole".  Given some samples today.    Medication Adjustments/Labs and Tests Ordered: Current medicines are reviewed at length with the patient today.  Concerns regarding  medicines are outlined above.  Medication changes, Labs and Tests ordered today are listed in the Patient Instructions below. Patient Instructions  Dr Sallyanne Kuster has recommended making the following  medication changes: 1. START Potassium 10 mEq daily  Your physician recommends that you schedule a follow-up appointment in 12 months. You will receive a reminder letter in the mail two months in advance. If you don't receive a letter, please call our office to schedule the follow-up appointment.  If you need a refill on your cardiac medications before your next appointment, please call your pharmacy.   Medication samples have been provided to the patient. Drug name: Xarelto 20 mg Qty: 28 tablets LOT: [id number] Exp.Date: 07/2019    SignedSanda Klein, MD  08/23/2017 9:11 AM    [facility] [place], [place], [place]  [address] Phone: [phone]; Fax: [phone]

## 2017-08-23 NOTE — Patient Instructions (Addendum)
Dr Sallyanne Kuster has recommended making the following medication changes: 1. START Potassium 10 mEq daily  Your physician recommends that you schedule a follow-up appointment in 12 months. You will receive a reminder letter in the mail two months in advance. If you don't receive a letter, please call our office to schedule the follow-up appointment.  If you need a refill on your cardiac medications before your next appointment, please call your pharmacy.   Medication samples have been provided to the patient. Drug name: Xarelto 20 mg Qty: 28 tablets LOT: [id number] Exp.Date: 07/2019

## 2018-01-11 ENCOUNTER — Other Ambulatory Visit (HOSPITAL_COMMUNITY): Payer: Self-pay | Admitting: Physician Assistant

## 2018-01-11 DIAGNOSIS — Z96652 Presence of left artificial knee joint: Secondary | ICD-10-CM

## 2018-01-20 ENCOUNTER — Encounter (HOSPITAL_COMMUNITY)
Admission: RE | Admit: 2018-01-20 | Discharge: 2018-01-20 | Disposition: A | Discharge status: Home / Self Care | Payer: Medicare Other | Source: Ambulatory Visit | Attending: Physician Assistant | Admitting: Physician Assistant

## 2018-01-20 DIAGNOSIS — Z96652 Presence of left artificial knee joint: Secondary | ICD-10-CM | POA: Insufficient documentation

## 2018-01-20 MED ORDER — TECHNETIUM TC 99M MEDRONATE IV KIT
22.0000 | PACK | Freq: Once | INTRAVENOUS | Status: AC | PRN
  Administered 2018-01-20: 22 via INTRAVENOUS

## 2018-08-22 ENCOUNTER — Telehealth: Payer: Self-pay | Admitting: *Deleted

## 2018-08-22 NOTE — Telephone Encounter (Signed)
Left a message for the patient to call back to see if he would be interested in moving his appointment with Dr. Sallyanne Kuster to this Thursday as a virtual visit.

## 2018-09-12 ENCOUNTER — Other Ambulatory Visit: Payer: Self-pay | Admitting: Cardiovascular Disease

## 2018-09-12 NOTE — H&P (Signed)
[name] is an 68 y.o. male.    Chief Complaint: Infected left TKA  Procedure:   Left total knee complete resection vs just tibial component  HPI: Pt is a 68 y.o. male complaining of left knee pain for 1 year. Pain had continually increased since the beginning. X-rays in the clinic show previous surgery implants of the left knee. Pt has tried various conservative treatments which have failed to alleviate their symptoms.  Rates his pain as a 10/10 when it is at it's worst.  Pain effects him all the time and even keeps him up at night.  Various options are discussed with the patient. Risks, benefits and expectations were discussed with the patient. Patient understand the risks, benefits and expectations and wishes to proceed with surgery.    PCP: Bernerd Limbo, MD  D/C Plans:       Home   Post-op Meds:       No Rx given   Tranexamic Acid:      To be given - IV   FYI:     Xarelto    Norco  CPAP  DME:   Pt already has equipment  PT:    No PT  /  [place]   PMH: Past Medical History:  Diagnosis Date  . CHF (congestive heart failure) ([place])   . Chronic anticoagulation    with Xarelto  . Complication of anesthesia    PT STATES HARD TO WAKE UP AFTER ONE SUGERY -STATES THE SURGERY TOOK LONGER THAN EXPECTED.  NO PROBLEMS WITH ANY OTHER SURGERY  . Dysrhythmia    A-fib  . GERD (gastroesophageal reflux disease)   . Headache   . History of blood clots   . History of blood transfusion   . Hypothyroidism   . Pain    BACK PAIN - PT ATTRIBUTES TO THE WAY HE WALKS DUE TO LEFT KNEE PROBLEM  . Persistent atrial fibrillation ([place])   . Septic arthritis of knee ([facility])    LEFT KNEE  . Shortness of breath    WITH EXERTION AND PAIN  . Sleep apnea    uses CPAP    PSH: Past Surgical History:  Procedure Laterality Date  . 2010 REMOVAL OF LEFT TOTAL KNEE    . CARDIAC CATHETERIZATION    . CARDIOVERSION N/A 08/28/2012   Procedure: CARDIOVERSION;  Surgeon:  Sanda Klein, MD;  Location: [facility];  Service: Cardiovascular;  Laterality: N/A;  . CARDIOVERSION N/A 09/01/2012   Procedure: CARDIOVERSION;  Surgeon: Pixie Casino, MD;  Location: [facility];  Service: Cardiovascular;  Laterality: N/A;  . COLONOSCOPY N/A 06/13/2013   Procedure: COLONOSCOPY;  Surgeon: Inda Castle, MD;  Location: [facility];  Service: Endoscopy;  Laterality: N/A;  . HYDROCELECTOY   2012  . LEFT KNEE ARTHROSCOPY  1999  . LEFT KNEE SURGERY UPPER TIBIAL OSTOMY    . LEFT TOTAL KNEE REMOVAL FOR INFECTION  2006  . REIMPLANTATION LEFT TOTAL KNEE  2008  . REIMPLANTATION LEFT TOTAL KNEE   2006  . REIMPLANTATION OF TOTAL KNEE Left 06/23/2012   Procedure: REIMPLANTATION OF LEFT TOTAL KNEE;  Surgeon: Mauri Pole, MD;  Location: [facility];  Service: Orthopedics;  Laterality: Left;  . REMOVAL LEFT TOTAL KNEE   2008  . REPLACEMENT LEFT KNEE  2002  . REPLACEMENT RIGHT KNEE  2003  . REVISION LEFT KNEE CAP   2004  . RIGHT KNEE ARTHROSCOPY  1998  . TEE WITHOUT CARDIOVERSION N/A  08/28/2012   Procedure: TRANSESOPHAGEAL ECHOCARDIOGRAM (TEE);  Surgeon: Sanda Klein, MD;  Location: [facility];  Service: Cardiovascular;  Laterality: N/A;  . TONSILLECTOMY    . TOTAL KNEE REVISION Left 12/15/2015   Procedure: TOTAL KNEE REVISION REPLACEMENT;  Surgeon: Paralee Cancel, MD;  Location: [facility];  Service: Orthopedics;  Laterality: Left;  Adductor Block    Social History:  reports that he has never smoked. He has never used smokeless tobacco. He reports current alcohol use. He reports that he does not use drugs.  Allergies:  Allergies  Allergen Reactions  . Morphine Hives    Medications: No current facility-administered medications for this encounter.    Current Outpatient Medications  Medication Sig Dispense Refill  . diltiazem (CARDIZEM CD) 240 MG 24 hr capsule Take 1 capsule (240 mg total) by mouth daily. 90 capsule 3  . furosemide (LASIX) 20 MG tablet Take 1 tablet (20 mg total) by mouth daily as  needed. 90 tablet 3  . levothyroxine (SYNTHROID, LEVOTHROID) 112 MCG tablet Take 112 mcg by mouth daily.     . metoprolol tartrate (LOPRESSOR) 50 MG tablet Take 1 tablet (50 mg total) by mouth 2 (two) times daily. 180 tablet 3  . Multiple Vitamin (MULTIVITAMIN WITH MINERALS) TABS Take 1 tablet by mouth daily.    . pantoprazole (PROTONIX) 40 MG tablet TAKE 1 TABLET BY MOUTH DAILY AT 12 NOON (Patient taking differently: take as directed) 30 tablet 11  . potassium chloride (K-DUR) 10 MEQ tablet Take 1 tablet (10 mEq total) by mouth daily. 90 tablet 3  . rivaroxaban (XARELTO) 20 MG TABS tablet Take 1 tablet (20 mg total) by mouth daily with breakfast. 90 tablet 3       Review of Systems  Constitutional: Negative.   HENT: Negative.   Eyes: Negative.   Respiratory: Negative.   Cardiovascular: Negative.   Gastrointestinal: Positive for heartburn.  Genitourinary: Negative.   Musculoskeletal: Positive for back pain and joint pain.  Skin: Negative.   Neurological: Positive for headaches.  Endo/Heme/Allergies: Negative.   Psychiatric/Behavioral: Negative.        Physical Exam  Constitutional: He is oriented to person, place, and time. He appears well-developed.  HENT:  Head: Normocephalic.  Eyes: Pupils are equal, round, and reactive to light.  Neck: Neck supple. No JVD present. No tracheal deviation present. No thyromegaly present.  Cardiovascular: Normal rate, regular rhythm and intact distal pulses.  Respiratory: Effort normal and breath sounds normal. No respiratory distress. He has no wheezes.  GI: Soft. There is no abdominal tenderness. There is no guarding.  Musculoskeletal:     Left knee: He exhibits decreased range of motion, swelling and effusion. He exhibits no laceration (healed previous incision). Tenderness found.  Lymphadenopathy:    He has no cervical adenopathy.  Neurological: He is alert and oriented to person, place, and time.  Skin: Skin is warm and dry.   Psychiatric: He has a normal mood and affect.       Assessment/Plan Assessment:  Infected left TKA  Plan: Patient will undergo a left total knee complete resection vs just tibial component on 09/21/18 per Dr. Alvan Dame at [facility]. Risks benefits and expectations were discussed with the patient. Patient understand risks, benefits and expectations and wishes to proceed.   Garrett Pugh Jamille Fisher   PA-C  09/12/2018, 11:18 PM

## 2018-09-13 NOTE — Telephone Encounter (Signed)
Please review for refill.  

## 2018-09-13 NOTE — Telephone Encounter (Signed)
Called and spoke w/pt regarding overdue labs and the pt is upset and thinks they have had it done and its not in kpn, epic, or care every where. Pt stated they will call pcp and ask them and then call us back because the last labs we have are from 3 yrs ago

## 2018-09-14 ENCOUNTER — Telehealth: Payer: Self-pay | Admitting: *Deleted

## 2018-09-14 ENCOUNTER — Other Ambulatory Visit (HOSPITAL_COMMUNITY): Payer: Self-pay | Admitting: *Deleted

## 2018-09-14 NOTE — Patient Instructions (Addendum)
YOU NEED TO HAVE A COVID 19 TEST ON___Monday August, 10th ____ @___10 :00am____, THIS TEST MUST BE DONE BEFORE SURGERY, COME  [place], [place] , [address]. ONCE YOUR COVID TEST IS COMPLETED, PLEASE BEGIN THE QUARANTINE INSTRUCTIONS AS OUTLINED IN YOUR HANDOUT.                [name]    Your procedure is scheduled on: 09-21-2018   Report to [facility]  Entrance    Report to admitting at 6:30 AM     BRING CPAP MASK AND TUBING    1 VISITOR IS ALLOWED TO WAIT IN WAITING ROOM  ONLY DAY OF YOUR SURGERY.    Call this number if you have problems the morning of surgery [phone]    Remember: [place], NO CHEWING GUM CANDY OR MINTS.   Do not eat food After Midnight. YOU MAY HAVE CLEAR LIQUIDS FROM MIDNIGHT UNTIL 6:00AM. At 6:00AM Please finish the prescribed Pre-Surgery Gatorade drink. Nothing by mouth after you finish the Gatorade drink !   CLEAR LIQUID DIET   Foods Allowed                                                                     Foods Excluded  Coffee and tea, regular and decaf                             liquids that you cannot  Plain Jell-O any favor except red or purple                                           see through such as: Fruit ices (not with fruit pulp)                                     milk, soups, orange juice  Iced Popsicles                                    All solid food Carbonated beverages, regular and diet                                    Cranberry, grape and apple juices Sports drinks like Gatorade Lightly seasoned clear broth or consume(fat free) Sugar, honey syrup  Sample Menu Breakfast                                Lunch                                     Supper Cranberry juice  Beef broth                            Chicken broth Jell-O                                     Grape juice                           Apple  juice Coffee or tea                        Jell-O                                      Popsicle                                                Coffee or tea                        Coffee or tea  _____________________________________________________________________     Take these medicines the morning of surgery with A SIP OF WATER: Diltiazem, Metoprolol, Levothyroxine, Tamsulosin                                You may not have any metal on your body including hair pins and              piercings  Do not wear jewelry, make-up, lotions, powders or perfumes, deodorant                     Men may shave face and neck.   Do not bring valuables to the hospital. [place].  Contacts, dentures or bridgework may not be worn into surgery.  Leave suitcase in the car. After surgery it may be brought to your room.      _____________________________________________________________________             [facility] - Preparing for Surgery Before surgery, you can play an important role.  Because skin is not sterile, your skin needs to be as free of germs as possible.  You can reduce the number of germs on your skin by washing with CHG (chlorahexidine gluconate) soap before surgery.  CHG is an antiseptic cleaner which kills germs and bonds with the skin to continue killing germs even after washing. Please DO NOT use if you have an allergy to CHG or antibacterial soaps.  If your skin becomes reddened/irritated stop using the CHG and inform your nurse when you arrive at Short Stay. Do not shave (including legs and underarms) for at least 48 hours prior to the first CHG shower.  You may shave your face/neck. Please follow these instructions carefully:  1.  Shower with CHG Soap the night before surgery and the  morning of Surgery.  2.  If you choose to wash your hair, wash your hair first as usual with  your  normal  shampoo.  3.  After you shampoo, rinse  your hair and body thoroughly to remove the  shampoo.                           4.  Use CHG as you would any other liquid soap.  You can apply chg directly  to the skin and wash                       Gently with a scrungie or clean washcloth.  5.  Apply the CHG Soap to your body ONLY FROM THE NECK DOWN.   Do not use on face/ open                           Wound or open sores. Avoid contact with eyes, ears mouth and genitals (private parts).                       Wash face,  Genitals (private parts) with your normal soap.             6.  Wash thoroughly, paying special attention to the area where your surgery  will be performed.  7.  Thoroughly rinse your body with warm water from the neck down.  8.  DO NOT shower/wash with your normal soap after using and rinsing off  the CHG Soap.                9.  Pat yourself dry with a clean towel.            10.  Wear clean pajamas.            11.  Place clean sheets on your bed the night of your first shower and do not  sleep with pets. Day of Surgery : Do not apply any lotions/deodorants the morning of surgery.  Please wear clean clothes to the hospital/surgery center.  FAILURE TO FOLLOW THESE INSTRUCTIONS MAY RESULT IN THE CANCELLATION OF YOUR SURGERY PATIENT SIGNATURE_________________________________  NURSE SIGNATURE__________________________________  ________________________________________________________________________   [name]  An incentive spirometer is a tool that can help keep your lungs clear and active. This tool measures how well you are filling your lungs with each breath. Taking long deep breaths may help reverse or decrease the chance of developing breathing (pulmonary) problems (especially infection) following:  A long period of time when you are unable to move or be active. BEFORE THE PROCEDURE   If the spirometer includes an indicator to show your best effort, your nurse or respiratory therapist will set it to a  desired goal.  If possible, sit up straight or lean slightly forward. Try not to slouch.  Hold the incentive spirometer in an upright position. INSTRUCTIONS FOR USE  1. Sit on the edge of your bed if possible, or sit up as far as you can in bed or on a chair. 2. Hold the incentive spirometer in an upright position. 3. Breathe out normally. 4. Place the mouthpiece in your mouth and seal your lips tightly around it. 5. Breathe in slowly and as deeply as possible, raising the piston or the ball toward the top of the column. 6. Hold your breath for 3-5 seconds or for as long as possible. Allow the piston or ball to fall to the bottom of the column. 7. Remove the mouthpiece from your  mouth and breathe out normally. 8. Rest for a few seconds and repeat Steps 1 through 7 at least 10 times every 1-2 hours when you are awake. Take your time and take a few normal breaths between deep breaths. 9. The spirometer may include an indicator to show your best effort. Use the indicator as a goal to work toward during each repetition. 10. After each set of 10 deep breaths, practice coughing to be sure your lungs are clear. If you have an incision (the cut made at the time of surgery), support your incision when coughing by placing a pillow or rolled up towels firmly against it. Once you are able to get out of bed, walk around indoors and cough well. You may stop using the incentive spirometer when instructed by your caregiver.  RISKS AND COMPLICATIONS  Take your time so you do not get dizzy or light-headed.  If you are in pain, you may need to take or ask for pain medication before doing incentive spirometry. It is harder to take a deep breath if you are having pain. AFTER USE  Rest and breathe slowly and easily.  It can be helpful to keep track of a log of your progress. Your caregiver can provide you with a simple table to help with this. If you are using the spirometer at home, follow these  instructions: [place] IF:   You are having difficultly using the spirometer.  You have trouble using the spirometer as often as instructed.  Your pain medication is not giving enough relief while using the spirometer.  You develop fever of 100.5 F (38.1 C) or higher. SEEK IMMEDIATE MEDICAL CARE IF:   You cough up bloody sputum that had not been present before.  You develop fever of 102 F (38.9 C) or greater.  You develop worsening pain at or near the incision site. MAKE SURE YOU:   Understand these instructions.  Will watch your condition.  Will get help right away if you are not doing well or get worse. Document Released: 06/07/2006 Document Revised: 04/19/2011 Document Reviewed: 08/08/2006 ExitCare Patient Information 2014 ExitCare, [place].   ________________________________________________________________________  WHAT IS A BLOOD TRANSFUSION? Blood Transfusion Information  A transfusion is the replacement of blood or some of its parts. Blood is made up of multiple cells which provide different functions.  Red blood cells carry oxygen and are used for blood loss replacement.  White blood cells fight against infection.  Platelets control bleeding.  Plasma helps clot blood.  Other blood products are available for specialized needs, such as hemophilia or other clotting disorders. BEFORE THE TRANSFUSION  Who gives blood for transfusions?   Healthy volunteers who are fully evaluated to make sure their blood is safe. This is blood bank blood. Transfusion therapy is the safest it has ever been in the practice of medicine. Before blood is taken from a donor, a complete history is taken to make sure that person has no history of diseases nor engages in risky social behavior (examples are intravenous drug use or sexual activity with multiple partners). The donor's travel history is screened to minimize risk of transmitting infections, such as malaria. The donated  blood is tested for signs of infectious diseases, such as HIV and hepatitis. The blood is then tested to be sure it is compatible with you in order to minimize the chance of a transfusion reaction. If you or a relative donates blood, this is often done in anticipation of surgery and is not  appropriate for emergency situations. It takes many days to process the donated blood. RISKS AND COMPLICATIONS Although transfusion therapy is very safe and saves many lives, the main dangers of transfusion include:   Getting an infectious disease.  Developing a transfusion reaction. This is an allergic reaction to something in the blood you were given. Every precaution is taken to prevent this. The decision to have a blood transfusion has been considered carefully by your caregiver before blood is given. Blood is not given unless the benefits outweigh the risks. AFTER THE TRANSFUSION  Right after receiving a blood transfusion, you will usually feel much better and more energetic. This is especially true if your red blood cells have gotten low (anemic). The transfusion raises the level of the red blood cells which carry oxygen, and this usually causes an energy increase.  The nurse administering the transfusion will monitor you carefully for complications. HOME CARE INSTRUCTIONS  No special instructions are needed after a transfusion. You may find your energy is better. Speak with your caregiver about any limitations on activity for underlying diseases you may have. SEEK MEDICAL CARE IF:   Your condition is not improving after your transfusion.  You develop redness or irritation at the intravenous (IV) site. SEEK IMMEDIATE MEDICAL CARE IF:  Any of the following symptoms occur over the next 12 hours:  Shaking chills.  You have a temperature by mouth above 102 F (38.9 C), not controlled by medicine.  Chest, back, or muscle pain.  People around you feel you are not acting correctly or are  confused.  Shortness of breath or difficulty breathing.  Dizziness and fainting.  You get a rash or develop hives.  You have a decrease in urine output.  Your urine turns a dark color or changes to pink, red, or brown. Any of the following symptoms occur over the next 10 days:  You have a temperature by mouth above 102 F (38.9 C), not controlled by medicine.  Shortness of breath.  Weakness after normal activity.  The white part of the eye turns yellow (jaundice).  You have a decrease in the amount of urine or are urinating less often.  Your urine turns a dark color or changes to pink, red, or brown. Document Released: 01/23/2000 Document Revised: 04/19/2011 Document Reviewed: 09/11/2007 ExitCare Patient Information 2014 ExitCare, [place].  _______________________________________________________________________ WHAT IS A BLOOD TRANSFUSION? Blood Transfusion Information  A transfusion is the replacement of blood or some of its parts. Blood is made up of multiple cells which provide different functions.  Red blood cells carry oxygen and are used for blood loss replacement.  White blood cells fight against infection.  Platelets control bleeding.  Plasma helps clot blood.  Other blood products are available for specialized needs, such as hemophilia or other clotting disorders. BEFORE THE TRANSFUSION  Who gives blood for transfusions?   Healthy volunteers who are fully evaluated to make sure their blood is safe. This is blood bank blood. Transfusion therapy is the safest it has ever been in the practice of medicine. Before blood is taken from a donor, a complete history is taken to make sure that person has no history of diseases nor engages in risky social behavior (examples are intravenous drug use or sexual activity with multiple partners). The donor's travel history is screened to minimize risk of transmitting infections, such as malaria. The donated blood is tested for  signs of infectious diseases, such as HIV and hepatitis. The blood is then tested to be  sure it is compatible with you in order to minimize the chance of a transfusion reaction. If you or a relative donates blood, this is often done in anticipation of surgery and is not appropriate for emergency situations. It takes many days to process the donated blood. RISKS AND COMPLICATIONS Although transfusion therapy is very safe and saves many lives, the main dangers of transfusion include:   Getting an infectious disease.  Developing a transfusion reaction. This is an allergic reaction to something in the blood you were given. Every precaution is taken to prevent this. The decision to have a blood transfusion has been considered carefully by your caregiver before blood is given. Blood is not given unless the benefits outweigh the risks. AFTER THE TRANSFUSION  Right after receiving a blood transfusion, you will usually feel much better and more energetic. This is especially true if your red blood cells have gotten low (anemic). The transfusion raises the level of the red blood cells which carry oxygen, and this usually causes an energy increase.  The nurse administering the transfusion will monitor you carefully for complications. HOME CARE INSTRUCTIONS  No special instructions are needed after a transfusion. You may find your energy is better. Speak with your caregiver about any limitations on activity for underlying diseases you may have. SEEK MEDICAL CARE IF:   Your condition is not improving after your transfusion.  You develop redness or irritation at the intravenous (IV) site. SEEK IMMEDIATE MEDICAL CARE IF:  Any of the following symptoms occur over the next 12 hours:  Shaking chills.  You have a temperature by mouth above 102 F (38.9 C), not controlled by medicine.  Chest, back, or muscle pain.  People around you feel you are not acting correctly or are confused.  Shortness of breath  or difficulty breathing.  Dizziness and fainting.  You get a rash or develop hives.  You have a decrease in urine output.  Your urine turns a dark color or changes to pink, red, or brown. Any of the following symptoms occur over the next 10 days:  You have a temperature by mouth above 102 F (38.9 C), not controlled by medicine.  Shortness of breath.  Weakness after normal activity.  The white part of the eye turns yellow (jaundice).  You have a decrease in the amount of urine or are urinating less often.  Your urine turns a dark color or changes to pink, red, or brown. Document Released: 01/23/2000 Document Revised: 04/19/2011 Document Reviewed: 09/11/2007 [facility] Patient Information 2014 [place], [place].  _______________________________________________________________________

## 2018-09-14 NOTE — Telephone Encounter (Signed)
    COVID-19 Pre-Screening Questions:  . In the past 7 to 10 days have you had a cough,  shortness of breath, headache, congestion, fever (100 or greater) body aches, chills, sore throat, or sudden loss of taste or sense of smell? No . Have you been around anyone with known Covid 19. No . Have you been around anyone who is awaiting Covid 19 test results in the past 7 to 10 days? No . Have you been around anyone who has been exposed to Covid 19, or has mentioned symptoms of Covid 19 within the past 7 to 10 days? No  If you have any concerns/questions about symptoms patients report during screening (either on the phone or at threshold). Contact the provider seeing the patient or DOD for further guidance.  If neither are available contact a member of the leadership team.   Labs have been requested from [facility]. They will fax them over.

## 2018-09-14 NOTE — Progress Notes (Signed)
CARDIOLOGY LOV DR [id number] 08-23-17 Epic

## 2018-09-14 NOTE — Progress Notes (Signed)
Cardiology Office Note    Date:  09/14/2018   ID:  [name], DOB 08/18/1950, MRN [id number]  PCP:  Bernerd Limbo, MD  Cardiologist:   Sanda Klein, MD   No chief complaint on file.   History of Present Illness:  [name] is a 68 y.o. male who presents for permanent atrial fibrillation (previously complicated by tachycardia related cardiomyopathy and heart failure) and history of recurrent venous thromboembolic events, on a background of superobesity, sleep apnea on CPAP and multiple orthopedic problems (13 previous knee surgeries) which limit his ability to be physically active.   His most recent knee replacement revision (left) was in November 2017, but he is scheduled for a left total knee complete resection vs just tibial component on 09/21/18 per Dr. Alvan Dame at [facility]..  The patient specifically denies any chest pain at rest exertion, dyspnea at rest or with exertion, orthopnea, paroxysmal nocturnal dyspnea, syncope, palpitations, focal neurological deficits, intermittent claudication, unexplained weight gain, cough, hemoptysis or wheezing.  He has chronic bilateral calf edema despite compression stockings.  Past Medical History:  Diagnosis Date  . CHF (congestive heart failure) ([place])   . Chronic anticoagulation    with Xarelto  . Complication of anesthesia    PT STATES HARD TO WAKE UP AFTER ONE SUGERY -STATES THE SURGERY TOOK LONGER THAN EXPECTED.  NO PROBLEMS WITH ANY OTHER SURGERY  . Dysrhythmia    A-fib  . GERD (gastroesophageal reflux disease)   . Headache   . History of blood clots   . History of blood transfusion   . Hypothyroidism   . Pain    BACK PAIN - PT ATTRIBUTES TO THE WAY HE WALKS DUE TO LEFT KNEE PROBLEM  . Persistent atrial fibrillation ([place])   . Septic arthritis of knee (H[facility])    LEFT KNEE  . Shortness of breath    WITH EXERTION AND PAIN  . Sleep apnea    uses CPAP    Past Surgical History:  Procedure Laterality Date   . 2010 REMOVAL OF LEFT TOTAL KNEE    . CARDIAC CATHETERIZATION    . CARDIOVERSION N/A 08/28/2012   Procedure: CARDIOVERSION;  Surgeon: Sanda Klein, MD;  Location: [facility];  Service: Cardiovascular;  Laterality: N/A;  . CARDIOVERSION N/A 09/01/2012   Procedure: CARDIOVERSION;  Surgeon: Pixie Casino, MD;  Location: [facility];  Service: Cardiovascular;  Laterality: N/A;  . COLONOSCOPY N/A 06/13/2013   Procedure: COLONOSCOPY;  Surgeon: Inda Castle, MD;  [facility];  Service: Endoscopy;  Laterality: N/A;  . HYDROCELECTOY   2012  . LEFT KNEE ARTHROSCOPY  1999  . LEFT KNEE SURGERY UPPER TIBIAL OSTOMY    . LEFT TOTAL KNEE REMOVAL FOR INFECTION  2006  . REIMPLANTATION LEFT TOTAL KNEE  2008  . REIMPLANTATION LEFT TOTAL KNEE   2006  . REIMPLANTATION OF TOTAL KNEE Left 06/23/2012   Procedure: REIMPLANTATION OF LEFT TOTAL KNEE;  Surgeon: Mauri Pole, MD;  [facility];  Service: Orthopedics;  Laterality: Left;  . REMOVAL LEFT TOTAL KNEE   2008  . REPLACEMENT LEFT KNEE  2002  . REPLACEMENT RIGHT KNEE  2003  . REVISION LEFT KNEE CAP   2004  . RIGHT KNEE ARTHROSCOPY  1998  . TEE WITHOUT CARDIOVERSION N/A 08/28/2012   Procedure: TRANSESOPHAGEAL ECHOCARDIOGRAM (TEE);  Surgeon: Sanda Klein, MD;  Location: [facility];  Service: Cardiovascular;  Laterality: N/A;  . TONSILLECTOMY    . TOTAL KNEE REVISION Left 12/15/2015  Procedure: TOTAL KNEE REVISION REPLACEMENT;  Surgeon: Paralee Cancel, MD;  [facility];  Service: Orthopedics;  Laterality: Left;  Adductor Block    Current Medications: Outpatient Medications Prior to Visit  Medication Sig Dispense Refill  . diltiazem (CARDIZEM CD) 240 MG 24 hr capsule Take 1 capsule (240 mg total) by mouth daily. 90 capsule 3  . furosemide (LASIX) 20 MG tablet TAKE 1 TABLET BY MOUTH DAILY AS NEEDED (Patient taking differently: Take 20 mg by mouth 2 (two) times daily. ) 90 tablet 3  . levothyroxine (SYNTHROID) 125 MCG tablet Take 125 mcg by mouth  daily before breakfast.     . metoprolol tartrate (LOPRESSOR) 50 MG tablet Take 1 tablet (50 mg total) by mouth 2 (two) times daily. 180 tablet 3  . Multiple Vitamin (MULTIVITAMIN WITH MINERALS) TABS Take 1 tablet by mouth daily.    . pantoprazole (PROTONIX) 40 MG tablet TAKE 1 TABLET BY MOUTH DAILY AT 12 NOON (Patient not taking: Reported on 09/13/2018) 30 tablet 11  . potassium chloride (K-DUR) 10 MEQ tablet Take 1 tablet (10 mEq total) by mouth daily. 90 tablet 3  . tamsulosin (FLOMAX) 0.4 MG CAPS capsule Take 0.4 mg by mouth daily.    . traMADol (ULTRAM) 50 MG tablet Take 50 mg by mouth every 6 (six) hours as needed for moderate pain.    Marland Kitchen XARELTO 20 MG TABS tablet TAKE 1 TABLET BY MOUTH DAILY WITH BREAKFAST 90 tablet 0   No facility-administered medications prior to visit.      Allergies:   Morphine   Social History   Socioeconomic History  . Marital status: Single    Spouse name: Not on file  . Number of children: Not on file  . Years of education: Not on file  . Highest education level: Not on file  Occupational History  . Not on file  Social Needs  . Financial resource strain: Not on file  . Food insecurity    Worry: Not on file    Inability: Not on file  . Transportation needs    Medical: Not on file    Non-medical: Not on file  Tobacco Use  . Smoking status: Never Smoker  . Smokeless tobacco: Never Used  Substance and Sexual Activity  . Alcohol use: Yes    Comment: 1-2 drinks per month   . Drug use: No  . Sexual activity: Yes    Partners: Female  Lifestyle  . Physical activity    Days per week: Not on file    Minutes per session: Not on file  . Stress: Not on file  Relationships  . Social Herbalist on phone: Not on file    Gets together: Not on file    Attends religious service: Not on file    Active member of club or organization: Not on file    Attends meetings of clubs or organizations: Not on file    Relationship status: Not on file  Other  Topics Concern  . Not on file  Social History Narrative  . Not on file     Family History:  The patient's family history includes Breast cancer in his sister; Cervical cancer in his sister; Diabetes in his sister; Heart disease (age of onset: 46) in his father; Liver cancer in his sister; Pancreatic cancer in his sister.   ROS:   Please see the history of present illness.    ROS all other systems reviewed and are negative  PHYSICAL EXAM:   VS:  There were no vitals taken for this visit.     General: Alert, oriented x3, no distress, super-obese Head: no evidence of trauma, PERRL, EOMI, no exophtalmos or lid lag, no myxedema, no xanthelasma; normal ears, nose and oropharynx Neck: normal jugular venous pulsations and no hepatojugular reflux; brisk carotid pulses without delay and no carotid bruits Chest: clear to auscultation, no signs of consolidation by percussion or palpation, normal fremitus, symmetrical and full respiratory excursions Cardiovascular: normal position and quality of the apical impulse, irregular rhythm, normal first and second heart sounds, no murmurs, rubs or gallops Abdomen: no tenderness or distention, no masses by palpation, no abnormal pulsatility or arterial bruits, normal bowel sounds, no hepatosplenomegaly Extremities: no clubbing, cyanosis or edema; 2+ radial, ulnar and brachial pulses bilaterally; 2+ right femoral, posterior tibial and dorsalis pedis pulses; 2+ left femoral, posterior tibial and dorsalis pedis pulses; no subclavian or femoral bruits Neurological: grossly nonfocal Psych: Normal mood and affect   Wt Readings from Last 3 Encounters:  08/23/17 (!) 393 lb 12.8 oz (178.6 kg)  08/17/16 (!) 375 lb (170.1 kg)  12/15/15 (!) 375 lb (170.1 kg)      Studies/Labs Reviewed:   EKG:  EKG is ordered today. It shows atrial fibrillation with controlled rate, 2 wide complex beats, minor IVCD (QRS 108 ms) Recent Labs: May 16, 2017 potassium 4.5,  creatinine 0.97, normal liver function tests, glucose 81, hemoglobin 13.2  Lipid Panel    Component Value Date/Time   CHOL 109 08/27/2012 0534   TRIG 65 08/27/2012 0534   HDL 45 08/27/2012 0534   CHOLHDL 2.4 08/27/2012 0534   VLDL 13 08/27/2012 0534   LDLCALC 51 08/27/2012 0534     ASSESSMENT:    1. Persistent atrial fibrillation   2. Chronic diastolic heart failure ([place])   3. OSA on CPAP   4. Super obesity   5. Chronic anticoagulation, with Xarelto   6. Preoperative cardiovascular examination      PLAN:  In order of problems listed above:  1. AFib: On appropriate anticoagulation. CHADSVasc 2 (age, CHF).  Well rate controlled on 2 agents.  He had tachycardia related cardiomyopathy, now resolved 2. CHF: Super obesity and limited activity level will make it hard to assess his functional status but as far as I can tell he is compensated and euvolemic.  We will add a low-dose potassium supplement to see if this helps with his muscle cramps. 3. OSA: Reports 100% compliance with CPAP .  He reports good results with treatment and denies daytime hypersomnolence 4. Super-obesity: I'm sure this is involved in his dyspnea both directly (thoracic restriction) and via obstructive sleep apnea and pulmonary hypertension. Afraid to consider bariatric surgery due to history of recurrent postsurgical infections.   5. OSA:  Scheduled for repeat CPAP titration, willl refer to Dr. Claiborne [facility] sleep clinic after surgery.. 6. Anticoagulation: Indicated for both atrial fibrillation and history of recurrent venous thromboembolic disease. No bleeding complications.   7. Preop CV exam:low risk for major CV complications other than venous thromboembolic problems. Resume Xarelto 20 mg daily as soon as possible postop.    Medication Adjustments/Labs and Tests Ordered: Current medicines are reviewed at length with the patient today.  Concerns regarding medicines are outlined above.  Medication changes, Labs and  Tests ordered today are listed in the Patient Instructions below. There are no Patient Instructions on file for this visit.   Signed, Sanda Klein, MD  09/14/2018 7:24 PM  [facility] [address], [place], [place]  [address] Phone: [phone]; Fax: [phone]

## 2018-09-15 ENCOUNTER — Encounter: Payer: Self-pay | Admitting: Cardiovascular Disease

## 2018-09-15 ENCOUNTER — Ambulatory Visit (INDEPENDENT_AMBULATORY_CARE_PROVIDER_SITE_OTHER): Payer: Medicare Other | Admitting: Cardiovascular Disease

## 2018-09-15 ENCOUNTER — Encounter (HOSPITAL_COMMUNITY): Payer: Self-pay

## 2018-09-15 ENCOUNTER — Other Ambulatory Visit: Payer: Self-pay

## 2018-09-15 ENCOUNTER — Encounter (HOSPITAL_COMMUNITY)
Admission: RE | Admit: 2018-09-15 | Discharge: 2018-09-15 | Disposition: A | Discharge status: Home / Self Care | Payer: Medicare Other | Source: Ambulatory Visit | Attending: Orthopedic Surgery | Admitting: Orthopedic Surgery

## 2018-09-15 VITALS — BP 96/67 | HR 80 | Temp 97.0°F | Ht 68.0 in | Wt 386.0 lb

## 2018-09-15 DIAGNOSIS — G4733 Obstructive sleep apnea (adult) (pediatric): Secondary | ICD-10-CM

## 2018-09-15 DIAGNOSIS — Z7901 Long term (current) use of anticoagulants: Secondary | ICD-10-CM | POA: Diagnosis not present

## 2018-09-15 DIAGNOSIS — I5032 Chronic diastolic (congestive) heart failure: Secondary | ICD-10-CM | POA: Diagnosis not present

## 2018-09-15 DIAGNOSIS — Z01812 Encounter for preprocedural laboratory examination: Secondary | ICD-10-CM | POA: Insufficient documentation

## 2018-09-15 DIAGNOSIS — E669 Obesity, unspecified: Secondary | ICD-10-CM

## 2018-09-15 DIAGNOSIS — T8453XA Infection and inflammatory reaction due to internal right knee prosthesis, initial encounter: Secondary | ICD-10-CM | POA: Diagnosis not present

## 2018-09-15 DIAGNOSIS — Z20828 Contact with and (suspected) exposure to other viral communicable diseases: Secondary | ICD-10-CM | POA: Diagnosis not present

## 2018-09-15 DIAGNOSIS — I4819 Other persistent atrial fibrillation: Secondary | ICD-10-CM | POA: Diagnosis not present

## 2018-09-15 DIAGNOSIS — Z9989 Dependence on other enabling machines and devices: Secondary | ICD-10-CM

## 2018-09-15 DIAGNOSIS — Z01810 Encounter for preprocedural cardiovascular examination: Secondary | ICD-10-CM

## 2018-09-15 LAB — CBC
HCT: 37.7 % — ABNORMAL LOW (ref 39.0–52.0)
Hemoglobin: 11.3 g/dL — ABNORMAL LOW (ref 13.0–17.0)
MCH: 29.2 pg (ref 26.0–34.0)
MCHC: 30 g/dL (ref 30.0–36.0)
MCV: 97.4 fL (ref 80.0–100.0)
Platelets: 274 10*3/uL (ref 150–400)
RBC: 3.87 MIL/uL — ABNORMAL LOW (ref 4.22–5.81)
RDW: 15.6 % — ABNORMAL HIGH (ref 11.5–15.5)
WBC: 4.6 10*3/uL (ref 4.0–10.5)
nRBC: 0 % (ref 0.0–0.2)

## 2018-09-15 LAB — BASIC METABOLIC PANEL
Anion gap: 9 (ref 5–15)
BUN: 23 mg/dL (ref 8–23)
CO2: 26 mmol/L (ref 22–32)
Calcium: 10.2 mg/dL (ref 8.9–10.3)
Chloride: 104 mmol/L (ref 98–111)
Creatinine, Ser: 1.08 mg/dL (ref 0.61–1.24)
GFR calc Af Amer: >60 mL/min (ref >60)
GFR calc non Af Amer: >60 mL/min (ref >60)
Glucose, Bld: 104 mg/dL — ABNORMAL HIGH (ref 70–99)
Potassium: 4.6 mmol/L (ref 3.5–5.1)
Sodium: 139 mmol/L (ref 135–145)

## 2018-09-15 LAB — SURGICAL PCR SCREEN
MRSA, PCR: NEGATIVE
Staphylococcus aureus: NEGATIVE

## 2018-09-15 MED ORDER — RIVAROXABAN 20 MG PO TABS: 20.0000 mg | ORAL_TABLET | Freq: Every day | ORAL | 3 refills | Status: DC

## 2018-09-15 NOTE — Patient Instructions (Signed)
Medication Instructions:  Your physician recommends that you continue on your current medications as directed. Please refer to the Current Medication list given to you today.  If you need a refill on your cardiac medications before your next appointment, please call your pharmacy.    Follow-Up: At [facility], you and your health needs are our priority.  As part of our continuing mission to provide you with exceptional heart care, we have created designated Provider Care Teams.  These Care Teams include your primary Cardiologist (physician) and Advanced Practice Providers (APPs -  Physician Assistants and Nurse Practitioners) who all work together to provide you with the care you need, when you need it. You will need a follow up appointment in 12 months.  Please call our office 2 months in advance to schedule this appointment.  You may see Sanda Klein, MD or one of the following Advanced Practice Providers on your designated Care Team: [place], [place] . Fabian Sharp, PA-C  Any Other Special Instructions Will Be Listed Below (If Applicable).

## 2018-09-15 NOTE — Progress Notes (Signed)
Pre-op appt progress note:  Patient reports he was seen at cards office this morning 09-15-2018 and EKG was completed there . EKG ordered for pre-op appt today cancelled;  will await confirmed EKG from cards office to post.   Patient on Xarelto d/t hx of Afib. patient reports that both his cardiologist and surgeon's office advised him to hold Xarelto x 3 days .

## 2018-09-18 ENCOUNTER — Other Ambulatory Visit (HOSPITAL_COMMUNITY)
Admission: RE | Admit: 2018-09-18 | Discharge: 2018-09-18 | Disposition: A | Discharge status: Home / Self Care | Payer: Medicare Other | Source: Ambulatory Visit | Attending: Orthopedic Surgery | Admitting: Orthopedic Surgery

## 2018-09-18 DIAGNOSIS — Z01812 Encounter for preprocedural laboratory examination: Secondary | ICD-10-CM | POA: Diagnosis not present

## 2018-09-18 LAB — SARS CORONAVIRUS 2 (TAT 6-24 HRS): SARS Coronavirus 2: NEGATIVE

## 2018-09-19 NOTE — Anesthesia Preprocedure Evaluation (Addendum)
Anesthesia Evaluation  Patient identified by MRN, date of birth, ID band Patient awake    Reviewed: Allergy & Precautions, NPO status , Patient's Chart, lab work & pertinent test results  Airway Mallampati: III  TM Distance: >3 FB Neck ROM: Full    Dental no notable dental hx. (+) Teeth Intact   Pulmonary sleep apnea and Continuous Positive Airway Pressure Ventilation ,    Pulmonary exam normal breath sounds clear to auscultation       Cardiovascular Exercise Tolerance: Good hypertension, Pt. on medications +CHF  Normal cardiovascular exam+ dysrhythmias Atrial Fibrillation  Rhythm:Regular Rate:Normal  2014 LVEF 50-55%   Neuro/Psych  Headaches,    GI/Hepatic GERD  Medicated,  Endo/Other  diabetesHypothyroidism   Renal/GU Renal disease     Musculoskeletal   Abdominal (+) + obese,   Peds  Hematology  (+) anemia ,   Anesthesia Other Findings   Reproductive/Obstetrics                           Lab Results  Component Value Date   WBC 4.6 09/15/2018   HGB 11.3 (L) 09/15/2018   HCT 37.7 (L) 09/15/2018   MCV 97.4 09/15/2018   PLT 274 09/15/2018   Lab Results  Component Value Date   CREATININE 1.08 09/15/2018   BUN 23 09/15/2018   NA 139 09/15/2018   K 4.6 09/15/2018   CL 104 09/15/2018   CO2 26 09/15/2018     Anesthesia Physical Anesthesia Plan  ASA: IV  Anesthesia Plan: Spinal   Post-op Pain Management:  Regional for Post-op pain   Induction: Intravenous  PONV Risk Score and Plan: 4 or greater and Treatment may vary due to age or medical condition, Ondansetron and Dexamethasone  Airway Management Planned: Simple Face Mask and Natural Airway  Additional Equipment:   Intra-op Plan:   Post-operative Plan:   Informed Consent:     Dental advisory given  Plan Discussed with:   Anesthesia Plan Comments: (See PAT note 09/15/2018, Konrad Felix, PA-C Spinal w L Adductor  canal Block)      Anesthesia Quick Evaluation

## 2018-09-19 NOTE — Progress Notes (Signed)
Anesthesia Chart Review   Case: [id number] Date/Time: 09/21/18 0843   Procedure: Resection of tibia versus both components with placement of antibiotic spacer (Left ) - 2.5 hrs   Anesthesia type: Spinal   Pre-op diagnosis: Infection Left total knee arthroplasty   [facility] 10 / [facility]   Surgeon: Paralee Cancel, MD      DISCUSSION:68 y.o. never smoker with h/o hypothyroidism, CHF, sleep apnea w/CPAP, A-fib (on Xarelto), GERD, infection left total knee arthroplasty scheduled for above procedure 09/21/2018 with Dr. Paralee Cancel.   Pt last seen by cardiologist, Dr. Sanda Klein, 09/15/2018. Per OV note, "low risk for major CV complications other than venous thromboembolic problems. Resume Xarelto 20 mg daily as soon as possible postop."  Pt advised to hold Xarelto 3 days prior to procedure.   Anticipate pt can proceed with planned procedure barring acute status change.   VS: BP 139/75 (BP Location: Left Arm)   Pulse 65   Temp 36.8 C (Oral)   Resp 18   Ht 5\' 8"  (1.727 m)   Wt (!) 175.7 kg   SpO2 96%   BMI 58.91 kg/m   PROVIDERS: Bernerd Limbo, MD is PCP   Sanda Klein, MD is Cardiologist  LABS: Labs reviewed: Acceptable for surgery. (all labs ordered are listed, but only abnormal results are displayed)  Labs Reviewed  BASIC METABOLIC PANEL - Abnormal; Notable for the following components:      Result Value   Glucose, Bld 104 (*)    All other components within normal limits  CBC - Abnormal; Notable for the following components:   RBC 3.87 (*)    Hemoglobin 11.3 (*)    HCT 37.7 (*)    RDW 15.6 (*)    All other components within normal limits  SURGICAL PCR SCREEN  TYPE AND SCREEN     IMAGES:   EKG: 09/15/2018 Rate 72 bpm  Atrial fibrillation with premature ventricular or aberrantly conducted complexes   CV:  Past Medical History:  Diagnosis Date  . CHF (congestive heart failure) ([place])   . Chronic anticoagulation    with Xarelto  . Complication of  anesthesia    PT STATES HARD TO WAKE UP AFTER ONE SUGERY -STATES THE SURGERY TOOK LONGER THAN EXPECTED.  NO PROBLEMS WITH ANY OTHER SURGERY  . Dysrhythmia    A-fib  . GERD (gastroesophageal reflux disease)   . Headache   . History of blood clots   . History of blood transfusion   . Hypothyroidism   . Pain    BACK PAIN - PT ATTRIBUTES TO THE WAY HE WALKS DUE TO LEFT KNEE PROBLEM  . Persistent atrial fibrillation   . Septic arthritis of knee ([facility])    LEFT KNEE  . Shortness of breath    WITH EXERTION AND PAIN  . Sleep apnea    uses CPAP,    Past Surgical History:  Procedure Laterality Date  . 2010 REMOVAL OF LEFT TOTAL KNEE    . CARDIAC CATHETERIZATION    . CARDIOVERSION N/A 08/28/2012   Procedure: CARDIOVERSION;  Surgeon: Sanda Klein, MD;  Location: [facility];  Service: Cardiovascular;  Laterality: N/A;  . CARDIOVERSION N/A 09/01/2012   Procedure: CARDIOVERSION;  Surgeon: Pixie Casino, MD;  Location: [facility];  Service: Cardiovascular;  Laterality: N/A;  . COLONOSCOPY N/A 06/13/2013   Procedure: COLONOSCOPY;  Surgeon: Inda Castle, MD;  [facility];  Service: Endoscopy;  Laterality: N/A;  . HYDROCELECTOY   2012  . LEFT  KNEE ARTHROSCOPY  1999  . LEFT KNEE SURGERY UPPER TIBIAL OSTOMY    . LEFT TOTAL KNEE REMOVAL FOR INFECTION  2006  . REIMPLANTATION LEFT TOTAL KNEE  2008  . REIMPLANTATION LEFT TOTAL KNEE   2006  . REIMPLANTATION OF TOTAL KNEE Left 06/23/2012   Procedure: REIMPLANTATION OF LEFT TOTAL KNEE;  Surgeon: Mauri Pole, MD;  [facility];  Service: Orthopedics;  Laterality: Left;  . REMOVAL LEFT TOTAL KNEE   2008  . REPLACEMENT LEFT KNEE  2002  . REPLACEMENT RIGHT KNEE  2003  . REVISION LEFT KNEE CAP   2004  . RIGHT KNEE ARTHROSCOPY  1998  . TEE WITHOUT CARDIOVERSION N/A 08/28/2012   Procedure: TRANSESOPHAGEAL ECHOCARDIOGRAM (TEE);  Surgeon: Sanda Klein, MD;  Location: [facility];  Service: Cardiovascular;  Laterality: N/A;  . TONSILLECTOMY    . TOTAL  KNEE REVISION Left 12/15/2015   Procedure: TOTAL KNEE REVISION REPLACEMENT;  Surgeon: Paralee Cancel, MD;  [facility];  Service: Orthopedics;  Laterality: Left;  Adductor Block    MEDICATIONS: . diltiazem (CARDIZEM CD) 240 MG 24 hr capsule  . furosemide (LASIX) 20 MG tablet  . levothyroxine (SYNTHROID) 125 MCG tablet  . metoprolol tartrate (LOPRESSOR) 50 MG tablet  . Multiple Vitamin (MULTIVITAMIN WITH MINERALS) TABS  . potassium chloride (K-DUR) 10 MEQ tablet  . rivaroxaban (XARELTO) 20 MG TABS tablet  . tamsulosin (FLOMAX) 0.4 MG CAPS capsule  . traMADol (ULTRAM) 50 MG tablet   No current facility-administered medications for this encounter.      Maia Plan [facility] Pre-Surgical Testing [phone] 09/19/18  1:42 PM

## 2018-09-21 ENCOUNTER — Inpatient Hospital Stay (HOSPITAL_COMMUNITY): Payer: Medicare Other | Admitting: Physician Assistant

## 2018-09-21 ENCOUNTER — Other Ambulatory Visit: Payer: Self-pay

## 2018-09-21 ENCOUNTER — Encounter (HOSPITAL_COMMUNITY): Payer: Self-pay

## 2018-09-21 ENCOUNTER — Encounter (HOSPITAL_COMMUNITY)
Admission: RE | Disposition: A | Discharge status: Home Health | Payer: Self-pay | Source: Home / Self Care | Attending: Orthopedic Surgery

## 2018-09-21 ENCOUNTER — Inpatient Hospital Stay: Payer: Self-pay

## 2018-09-21 ENCOUNTER — Inpatient Hospital Stay (HOSPITAL_COMMUNITY): Payer: Medicare Other | Admitting: Anesthesiology

## 2018-09-21 ENCOUNTER — Inpatient Hospital Stay (HOSPITAL_COMMUNITY)
Admission: RE | Admit: 2018-09-21 | Discharge: 2018-09-25 | DRG: 467 | Disposition: A | Discharge status: Home Health | Payer: Medicare Other | Attending: Orthopedic Surgery | Admitting: Orthopedic Surgery

## 2018-09-21 DIAGNOSIS — Z89529 Acquired absence of unspecified knee: Secondary | ICD-10-CM | POA: Diagnosis not present

## 2018-09-21 DIAGNOSIS — T8454XA Infection and inflammatory reaction due to internal left knee prosthesis, initial encounter: Principal | ICD-10-CM | POA: Diagnosis present

## 2018-09-21 DIAGNOSIS — Z8249 Family history of ischemic heart disease and other diseases of the circulatory system: Secondary | ICD-10-CM | POA: Diagnosis not present

## 2018-09-21 DIAGNOSIS — B954 Other streptococcus as the cause of diseases classified elsewhere: Secondary | ICD-10-CM | POA: Diagnosis not present

## 2018-09-21 DIAGNOSIS — Z89522 Acquired absence of left knee: Secondary | ICD-10-CM | POA: Diagnosis not present

## 2018-09-21 DIAGNOSIS — Z8619 Personal history of other infectious and parasitic diseases: Secondary | ICD-10-CM | POA: Diagnosis not present

## 2018-09-21 DIAGNOSIS — Z79899 Other long term (current) drug therapy: Secondary | ICD-10-CM | POA: Diagnosis not present

## 2018-09-21 DIAGNOSIS — E039 Hypothyroidism, unspecified: Secondary | ICD-10-CM | POA: Diagnosis present

## 2018-09-21 DIAGNOSIS — K219 Gastro-esophageal reflux disease without esophagitis: Secondary | ICD-10-CM | POA: Diagnosis present

## 2018-09-21 DIAGNOSIS — Z7901 Long term (current) use of anticoagulants: Secondary | ICD-10-CM | POA: Diagnosis not present

## 2018-09-21 DIAGNOSIS — Z792 Long term (current) use of antibiotics: Secondary | ICD-10-CM | POA: Diagnosis not present

## 2018-09-21 DIAGNOSIS — Y831 Surgical operation with implant of artificial internal device as the cause of abnormal reaction of the patient, or of later complication, without mention of misadventure at the time of the procedure: Secondary | ICD-10-CM | POA: Diagnosis present

## 2018-09-21 DIAGNOSIS — Z86718 Personal history of other venous thrombosis and embolism: Secondary | ICD-10-CM

## 2018-09-21 DIAGNOSIS — I428 Other cardiomyopathies: Secondary | ICD-10-CM | POA: Diagnosis not present

## 2018-09-21 DIAGNOSIS — I509 Heart failure, unspecified: Secondary | ICD-10-CM | POA: Diagnosis present

## 2018-09-21 DIAGNOSIS — Z885 Allergy status to narcotic agent status: Secondary | ICD-10-CM | POA: Diagnosis not present

## 2018-09-21 DIAGNOSIS — Z6841 Body Mass Index (BMI) 40.0 and over, adult: Secondary | ICD-10-CM | POA: Diagnosis not present

## 2018-09-21 DIAGNOSIS — M549 Dorsalgia, unspecified: Secondary | ICD-10-CM | POA: Diagnosis present

## 2018-09-21 DIAGNOSIS — T8459XD Infection and inflammatory reaction due to other internal joint prosthesis, subsequent encounter: Secondary | ICD-10-CM | POA: Diagnosis not present

## 2018-09-21 DIAGNOSIS — Z79890 Hormone replacement therapy (postmenopausal): Secondary | ICD-10-CM

## 2018-09-21 DIAGNOSIS — I429 Cardiomyopathy, unspecified: Secondary | ICD-10-CM | POA: Diagnosis present

## 2018-09-21 DIAGNOSIS — M0020 Other streptococcal arthritis, unspecified joint: Secondary | ICD-10-CM | POA: Diagnosis not present

## 2018-09-21 DIAGNOSIS — I4821 Permanent atrial fibrillation: Secondary | ICD-10-CM | POA: Diagnosis present

## 2018-09-21 HISTORY — PX: EXCISIONAL TOTAL KNEE ARTHROPLASTY WITH ANTIBIOTIC SPACERS: SHX5827

## 2018-09-21 LAB — TYPE AND SCREEN
ABO/RH(D): O POS
Antibody Screen: NEGATIVE

## 2018-09-21 SURGERY — REMOVAL, TOTAL ARTHROPLASTY HARDWARE, KNEE, WITH ANTIBIOTIC SPACER INSERTION
Anesthesia: Spinal | Laterality: Left

## 2018-09-21 MED ORDER — ROPIVACAINE HCL 5 MG/ML IJ SOLN
INTRAMUSCULAR | Status: DC | PRN
  Administered 2018-09-21: 30 mL via PERINEURAL

## 2018-09-21 MED ORDER — POLYETHYLENE GLYCOL 3350 17 G PO PACK
17.0000 g | PACK | Freq: Two times a day (BID) | ORAL | Status: DC
  Administered 2018-09-21 – 2018-09-25 (×8): 17 g via ORAL
  Filled 2018-09-21 (×8): qty 1

## 2018-09-21 MED ORDER — VANCOMYCIN HCL 1000 MG IV SOLR
INTRAVENOUS | Status: AC
  Filled 2018-09-21: qty 1000

## 2018-09-21 MED ORDER — ACETAMINOPHEN 325 MG PO TABS: 325.0000 mg | ORAL_TABLET | Freq: Four times a day (QID) | ORAL | Status: DC | PRN

## 2018-09-21 MED ORDER — TRANEXAMIC ACID-NACL 1000-0.7 MG/100ML-% IV SOLN
1000.0000 mg | Freq: Once | INTRAVENOUS | Status: AC
  Administered 2018-09-21: 1000 mg via INTRAVENOUS
  Filled 2018-09-21: qty 100

## 2018-09-21 MED ORDER — CEFAZOLIN SODIUM-DEXTROSE 2-4 GM/100ML-% IV SOLN
2.0000 g | Freq: Three times a day (TID) | INTRAVENOUS | Status: DC
  Administered 2018-09-22 – 2018-09-23 (×5): 2 g via INTRAVENOUS
  Filled 2018-09-21 (×5): qty 100

## 2018-09-21 MED ORDER — TOBRAMYCIN SULFATE 1.2 G IJ SOLR
INTRAMUSCULAR | Status: DC | PRN
  Administered 2018-09-21: 2.4 g

## 2018-09-21 MED ORDER — BUPIVACAINE IN DEXTROSE 0.75-8.25 % IT SOLN
INTRATHECAL | Status: DC | PRN
  Administered 2018-09-21: 2 mL via INTRATHECAL

## 2018-09-21 MED ORDER — LEVOTHYROXINE SODIUM 125 MCG PO TABS
125.0000 ug | ORAL_TABLET | Freq: Every day | ORAL | Status: DC
  Administered 2018-09-22 – 2018-09-25 (×4): 125 ug via ORAL
  Filled 2018-09-21 (×4): qty 1

## 2018-09-21 MED ORDER — FENTANYL CITRATE (PF) 100 MCG/2ML IJ SOLN
INTRAMUSCULAR | Status: DC | PRN
  Administered 2018-09-21: 50 ug via INTRAVENOUS
  Administered 2018-09-21 (×2): 25 ug via INTRAVENOUS

## 2018-09-21 MED ORDER — PROPOFOL 10 MG/ML IV BOLUS
INTRAVENOUS | Status: AC
  Filled 2018-09-21: qty 40

## 2018-09-21 MED ORDER — POTASSIUM CHLORIDE CRYS ER 10 MEQ PO TBCR
10.0000 meq | EXTENDED_RELEASE_TABLET | Freq: Every day | ORAL | Status: DC
  Administered 2018-09-22 – 2018-09-25 (×4): 10 meq via ORAL
  Filled 2018-09-21 (×4): qty 1

## 2018-09-21 MED ORDER — CELECOXIB 200 MG PO CAPS
200.0000 mg | ORAL_CAPSULE | Freq: Two times a day (BID) | ORAL | Status: DC
  Administered 2018-09-21 – 2018-09-25 (×8): 200 mg via ORAL
  Filled 2018-09-21 (×8): qty 1

## 2018-09-21 MED ORDER — MAGNESIUM CITRATE PO SOLN: 1.0000 | Freq: Once | ORAL | Status: DC | PRN

## 2018-09-21 MED ORDER — METOPROLOL TARTRATE 50 MG PO TABS
50.0000 mg | ORAL_TABLET | Freq: Two times a day (BID) | ORAL | Status: DC
  Administered 2018-09-21 – 2018-09-25 (×8): 50 mg via ORAL
  Filled 2018-09-21 (×8): qty 1

## 2018-09-21 MED ORDER — FENTANYL CITRATE (PF) 100 MCG/2ML IJ SOLN
50.0000 ug | INTRAMUSCULAR | Status: DC
  Administered 2018-09-21: 08:00:00 50 ug via INTRAVENOUS

## 2018-09-21 MED ORDER — ONDANSETRON HCL 4 MG/2ML IJ SOLN: 4.0000 mg | Freq: Once | INTRAMUSCULAR | Status: DC | PRN

## 2018-09-21 MED ORDER — MIDAZOLAM HCL 2 MG/2ML IJ SOLN
INTRAMUSCULAR | Status: AC
  Filled 2018-09-21: qty 2

## 2018-09-21 MED ORDER — RIVAROXABAN 10 MG PO TABS
10.0000 mg | ORAL_TABLET | ORAL | Status: DC
  Administered 2018-09-22: 10 mg via ORAL
  Filled 2018-09-21: qty 1

## 2018-09-21 MED ORDER — ONDANSETRON HCL 4 MG PO TABS
4.0000 mg | ORAL_TABLET | Freq: Four times a day (QID) | ORAL | Status: DC | PRN
  Filled 2018-09-21: qty 1

## 2018-09-21 MED ORDER — DIPHENHYDRAMINE HCL 12.5 MG/5ML PO ELIX
12.5000 mg | ORAL_SOLUTION | ORAL | Status: DC | PRN
  Filled 2018-09-21: qty 10

## 2018-09-21 MED ORDER — METOCLOPRAMIDE HCL 5 MG PO TABS
5.0000 mg | ORAL_TABLET | Freq: Three times a day (TID) | ORAL | Status: DC | PRN
  Filled 2018-09-21: qty 2

## 2018-09-21 MED ORDER — SODIUM CHLORIDE (PF) 0.9 % IJ SOLN
INTRAMUSCULAR | Status: AC
  Filled 2018-09-21: qty 50

## 2018-09-21 MED ORDER — METHOCARBAMOL 500 MG IVPB - SIMPLE MED
500.0000 mg | Freq: Four times a day (QID) | INTRAVENOUS | Status: DC | PRN
  Filled 2018-09-21: qty 50

## 2018-09-21 MED ORDER — RIFAMPIN 300 MG PO CAPS
300.0000 mg | ORAL_CAPSULE | Freq: Two times a day (BID) | ORAL | Status: DC
  Filled 2018-09-21: qty 1

## 2018-09-21 MED ORDER — HYDROCODONE-ACETAMINOPHEN 7.5-325 MG PO TABS
1.0000 | ORAL_TABLET | ORAL | Status: DC | PRN
  Administered 2018-09-21 – 2018-09-22 (×3): 2 via ORAL
  Administered 2018-09-23: 07:00:00 1 via ORAL
  Filled 2018-09-21 (×2): qty 2
  Filled 2018-09-21: qty 1
  Filled 2018-09-21: qty 2

## 2018-09-21 MED ORDER — HYDROMORPHONE HCL 1 MG/ML IJ SOLN: 0.2500 mg | INTRAMUSCULAR | Status: DC | PRN

## 2018-09-21 MED ORDER — TRANEXAMIC ACID-NACL 1000-0.7 MG/100ML-% IV SOLN
INTRAVENOUS | Status: AC
  Filled 2018-09-21: qty 100

## 2018-09-21 MED ORDER — DEXAMETHASONE SODIUM PHOSPHATE 10 MG/ML IJ SOLN
10.0000 mg | Freq: Once | INTRAMUSCULAR | Status: AC
  Administered 2018-09-21: 12:00:00 10 mg via INTRAVENOUS

## 2018-09-21 MED ORDER — ALUM & MAG HYDROXIDE-SIMETH 200-200-20 MG/5ML PO SUSP: 15.0000 mL | ORAL | Status: DC | PRN

## 2018-09-21 MED ORDER — BISACODYL 10 MG RE SUPP: 10.0000 mg | Freq: Every day | RECTAL | Status: DC | PRN

## 2018-09-21 MED ORDER — MIDAZOLAM HCL 5 MG/5ML IJ SOLN
INTRAMUSCULAR | Status: DC | PRN
  Administered 2018-09-21: 2 mg via INTRAVENOUS

## 2018-09-21 MED ORDER — DEXAMETHASONE SODIUM PHOSPHATE 10 MG/ML IJ SOLN
10.0000 mg | Freq: Once | INTRAMUSCULAR | Status: AC
  Administered 2018-09-22: 10 mg via INTRAVENOUS
  Filled 2018-09-21: qty 1

## 2018-09-21 MED ORDER — TRANEXAMIC ACID-NACL 1000-0.7 MG/100ML-% IV SOLN
1000.0000 mg | INTRAVENOUS | Status: AC
  Administered 2018-09-21: 1000 mg via INTRAVENOUS

## 2018-09-21 MED ORDER — BUPIVACAINE-EPINEPHRINE (PF) 0.25% -1:200000 IJ SOLN
INTRAMUSCULAR | Status: AC
  Filled 2018-09-21: qty 30

## 2018-09-21 MED ORDER — LACTATED RINGERS IV SOLN
INTRAVENOUS | Status: DC
  Administered 2018-09-21 (×2): via INTRAVENOUS

## 2018-09-21 MED ORDER — CEFAZOLIN SODIUM-DEXTROSE 2-4 GM/100ML-% IV SOLN
2.0000 g | Freq: Four times a day (QID) | INTRAVENOUS | Status: DC
  Administered 2018-09-21: 2 g via INTRAVENOUS
  Filled 2018-09-21: qty 100

## 2018-09-21 MED ORDER — FENTANYL CITRATE (PF) 100 MCG/2ML IJ SOLN: 25.0000 ug | INTRAMUSCULAR | Status: DC | PRN

## 2018-09-21 MED ORDER — PROPOFOL 500 MG/50ML IV EMUL
INTRAVENOUS | Status: DC | PRN
  Administered 2018-09-21: 40 ug/kg/min via INTRAVENOUS

## 2018-09-21 MED ORDER — ONDANSETRON HCL 4 MG/2ML IJ SOLN
INTRAMUSCULAR | Status: AC
  Filled 2018-09-21: qty 2

## 2018-09-21 MED ORDER — KETOROLAC TROMETHAMINE 30 MG/ML IJ SOLN
INTRAMUSCULAR | Status: AC
  Filled 2018-09-21: qty 1

## 2018-09-21 MED ORDER — PHENOL 1.4 % MT LIQD: 1.0000 | OROMUCOSAL | Status: DC | PRN

## 2018-09-21 MED ORDER — ONDANSETRON HCL 4 MG/2ML IJ SOLN: 4.0000 mg | Freq: Four times a day (QID) | INTRAMUSCULAR | Status: DC | PRN

## 2018-09-21 MED ORDER — VANCOMYCIN HCL 10 G IV SOLR
1250.0000 mg | Freq: Two times a day (BID) | INTRAVENOUS | Status: DC
  Administered 2018-09-21 – 2018-09-22 (×2): 1250 mg via INTRAVENOUS
  Filled 2018-09-21 (×5): qty 1250

## 2018-09-21 MED ORDER — HYDROCODONE-ACETAMINOPHEN 5-325 MG PO TABS
1.0000 | ORAL_TABLET | ORAL | Status: DC | PRN
  Administered 2018-09-22 – 2018-09-24 (×5): 2 via ORAL
  Filled 2018-09-21 (×5): qty 2

## 2018-09-21 MED ORDER — FENTANYL CITRATE (PF) 100 MCG/2ML IJ SOLN
INTRAMUSCULAR | Status: AC
  Filled 2018-09-21: qty 2

## 2018-09-21 MED ORDER — DILTIAZEM HCL ER COATED BEADS 240 MG PO CP24
240.0000 mg | ORAL_CAPSULE | Freq: Every day | ORAL | Status: DC
  Administered 2018-09-22 – 2018-09-25 (×4): 240 mg via ORAL
  Filled 2018-09-21 (×4): qty 1

## 2018-09-21 MED ORDER — HYDROMORPHONE HCL 2 MG/ML IJ SOLN
INTRAMUSCULAR | Status: AC
  Filled 2018-09-21: qty 1

## 2018-09-21 MED ORDER — DEXAMETHASONE SODIUM PHOSPHATE 10 MG/ML IJ SOLN
INTRAMUSCULAR | Status: AC
  Filled 2018-09-21: qty 1

## 2018-09-21 MED ORDER — MEPERIDINE HCL 50 MG/ML IJ SOLN: 6.2500 mg | INTRAMUSCULAR | Status: DC | PRN

## 2018-09-21 MED ORDER — HYDROCODONE-ACETAMINOPHEN 7.5-325 MG PO TABS: 1.0000 | ORAL_TABLET | Freq: Once | ORAL | Status: DC | PRN

## 2018-09-21 MED ORDER — TOBRAMYCIN SULFATE 1.2 G IJ SOLR
INTRAMUSCULAR | Status: AC
  Filled 2018-09-21: qty 1.2

## 2018-09-21 MED ORDER — FENTANYL CITRATE (PF) 100 MCG/2ML IJ SOLN
INTRAMUSCULAR | Status: AC
  Administered 2018-09-21: 50 ug via INTRAVENOUS
  Filled 2018-09-21: qty 2

## 2018-09-21 MED ORDER — MIDAZOLAM HCL 2 MG/2ML IJ SOLN: 1.0000 mg | INTRAMUSCULAR | Status: DC

## 2018-09-21 MED ORDER — VANCOMYCIN HCL 1000 MG IV SOLR
INTRAVENOUS | Status: DC | PRN
  Administered 2018-09-21: 1 g via TOPICAL
  Administered 2018-09-21: 2 g

## 2018-09-21 MED ORDER — METOCLOPRAMIDE HCL 5 MG/ML IJ SOLN: 5.0000 mg | Freq: Three times a day (TID) | INTRAMUSCULAR | Status: DC | PRN

## 2018-09-21 MED ORDER — DOCUSATE SODIUM 100 MG PO CAPS
100.0000 mg | ORAL_CAPSULE | Freq: Two times a day (BID) | ORAL | Status: DC
  Administered 2018-09-21 – 2018-09-25 (×7): 100 mg via ORAL
  Filled 2018-09-21 (×7): qty 1

## 2018-09-21 MED ORDER — MENTHOL 3 MG MT LOZG: 1.0000 | LOZENGE | OROMUCOSAL | Status: DC | PRN

## 2018-09-21 MED ORDER — FERROUS SULFATE 325 (65 FE) MG PO TABS
325.0000 mg | ORAL_TABLET | Freq: Two times a day (BID) | ORAL | Status: DC
  Administered 2018-09-21 – 2018-09-25 (×8): 325 mg via ORAL
  Filled 2018-09-21 (×8): qty 1

## 2018-09-21 MED ORDER — SODIUM CHLORIDE 0.9 % IV SOLN
INTRAVENOUS | Status: DC
  Administered 2018-09-21: 18:00:00 via INTRAVENOUS

## 2018-09-21 MED ORDER — METHOCARBAMOL 500 MG PO TABS
500.0000 mg | ORAL_TABLET | Freq: Four times a day (QID) | ORAL | Status: DC | PRN
  Administered 2018-09-21 – 2018-09-22 (×2): 500 mg via ORAL
  Filled 2018-09-21 (×2): qty 1

## 2018-09-21 MED ORDER — CHLORHEXIDINE GLUCONATE 4 % EX LIQD: 60.0000 mL | Freq: Once | CUTANEOUS | Status: DC

## 2018-09-21 MED ORDER — SODIUM CHLORIDE 0.9 % IR SOLN
Status: DC | PRN
  Administered 2018-09-21: 1000 mL
  Administered 2018-09-21 (×2): 3000 mL

## 2018-09-21 MED ORDER — FUROSEMIDE 20 MG PO TABS
20.0000 mg | ORAL_TABLET | Freq: Two times a day (BID) | ORAL | Status: DC
  Administered 2018-09-22 – 2018-09-25 (×5): 20 mg via ORAL
  Filled 2018-09-21 (×6): qty 1

## 2018-09-21 MED ORDER — ONDANSETRON HCL 4 MG/2ML IJ SOLN
INTRAMUSCULAR | Status: DC | PRN
  Administered 2018-09-21: 4 mg via INTRAVENOUS

## 2018-09-21 MED ORDER — ACETAMINOPHEN 10 MG/ML IV SOLN: 1000.0000 mg | Freq: Once | INTRAVENOUS | Status: DC | PRN

## 2018-09-21 MED ORDER — TAMSULOSIN HCL 0.4 MG PO CAPS
0.4000 mg | ORAL_CAPSULE | Freq: Every day | ORAL | Status: DC
  Administered 2018-09-22 – 2018-09-25 (×4): 0.4 mg via ORAL
  Filled 2018-09-21 (×4): qty 1

## 2018-09-21 MED ORDER — VANCOMYCIN HCL 1000 MG IV SOLR
INTRAVENOUS | Status: AC
  Filled 2018-09-21: qty 2000

## 2018-09-21 MED ORDER — VANCOMYCIN HCL 10 G IV SOLR
2000.0000 mg | Freq: Once | INTRAVENOUS | Status: AC
  Administered 2018-09-21: 11:00:00 2000 mg via INTRAVENOUS
  Filled 2018-09-21: qty 2000

## 2018-09-21 MED ORDER — TOBRAMYCIN SULFATE 1.2 G IJ SOLR
INTRAMUSCULAR | Status: AC
  Filled 2018-09-21: qty 2.4

## 2018-09-21 SURGICAL SUPPLY — 74 items
BAG ZIPLOCK 12X15 (MISCELLANEOUS) ×2 IMPLANT
BANDAGE ESMARK 6X9 LF (GAUZE/BANDAGES/DRESSINGS) ×1 IMPLANT
BLADE SAW SGTL 11.0X1.19X90.0M (BLADE) IMPLANT
BLADE SAW SGTL 13.0X1.19X90.0M (BLADE) ×2 IMPLANT
BLADE SAW SGTL 81X20 HD (BLADE) ×2 IMPLANT
BNDG ELASTIC 6X5.8 VLCR STR LF (GAUZE/BANDAGES/DRESSINGS) ×4 IMPLANT
BNDG ESMARK 6X9 LF (GAUZE/BANDAGES/DRESSINGS) ×2
BOWL SMART MIX CTS (DISPOSABLE) IMPLANT
BRUSH FEMORAL CANAL (MISCELLANEOUS) ×2 IMPLANT
CEMENT HV SMART SET (Cement) ×6 IMPLANT
CONT SPEC 4OZ CLIKSEAL STRL BL (MISCELLANEOUS) ×6 IMPLANT
COVER SURGICAL LIGHT HANDLE (MISCELLANEOUS) ×2 IMPLANT
COVER WAND RF STERILE (DRAPES) IMPLANT
CUFF TOURN SGL QUICK 34 (TOURNIQUET CUFF)
CUFF TOURN SGL QUICK 42 (TOURNIQUET CUFF) ×2 IMPLANT
CUFF TRNQT CYL 34X4.125X (TOURNIQUET CUFF) IMPLANT
DERMABOND ADVANCED (GAUZE/BANDAGES/DRESSINGS)
DERMABOND ADVANCED .7 DNX12 (GAUZE/BANDAGES/DRESSINGS) IMPLANT
DRAPE POUCH INSTRU U-SHP 10X18 (DRAPES) ×2 IMPLANT
DRAPE SHEET LG 3/4 BI-LAMINATE (DRAPES) ×2 IMPLANT
DRAPE U-SHAPE 47X51 STRL (DRAPES) ×2 IMPLANT
DRESSING AQUACEL AG SP 3.5X10 (GAUZE/BANDAGES/DRESSINGS) ×1 IMPLANT
DRSG AQUACEL AG ADV 3.5X14 (GAUZE/BANDAGES/DRESSINGS) ×2 IMPLANT
DRSG AQUACEL AG SP 3.5X10 (GAUZE/BANDAGES/DRESSINGS) ×2
DRSG PAD ABDOMINAL 8X10 ST (GAUZE/BANDAGES/DRESSINGS) ×4 IMPLANT
DURAPREP 26ML APPLICATOR (WOUND CARE) ×4 IMPLANT
ELECT REM PT RETURN 15FT ADLT (MISCELLANEOUS) ×2 IMPLANT
FACESHIELD WRAPAROUND (MASK) ×10 IMPLANT
GAUZE SPONGE 4X4 12PLY STRL (GAUZE/BANDAGES/DRESSINGS) ×2 IMPLANT
GAUZE XEROFORM 1X8 LF (GAUZE/BANDAGES/DRESSINGS) ×4 IMPLANT
GLOVE BIOGEL M 7.0 STRL (GLOVE) IMPLANT
GLOVE BIOGEL PI IND STRL 7.5 (GLOVE) ×1 IMPLANT
GLOVE BIOGEL PI IND STRL 8.5 (GLOVE) ×1 IMPLANT
GLOVE BIOGEL PI INDICATOR 7.5 (GLOVE) ×1
GLOVE BIOGEL PI INDICATOR 8.5 (GLOVE) ×1
GLOVE ECLIPSE 8.0 STRL XLNG CF (GLOVE) ×4 IMPLANT
GLOVE ORTHO TXT STRL SZ7.5 (GLOVE) ×8 IMPLANT
GOWN STRL REUS W/TWL LRG LVL3 (GOWN DISPOSABLE) ×2 IMPLANT
GOWN STRL REUS W/TWL XL LVL3 (GOWN DISPOSABLE) ×2 IMPLANT
HANDPIECE INTERPULSE COAX TIP (DISPOSABLE) ×1
HOLDER FOLEY CATH W/STRAP (MISCELLANEOUS) ×2 IMPLANT
IMMOBILIZER KNEE 22  40 CIR (ORTHOPEDIC SUPPLIES) ×1
IMMOBILIZER KNEE 22 40 CIR (ORTHOPEDIC SUPPLIES) ×1 IMPLANT
INSERT TIB TC3 RP SZ5 20 ×2 IMPLANT
JET LAVAGE IRRISEPT WOUND (IRRIGATION / IRRIGATOR) ×2
KIT TURNOVER KIT A (KITS) IMPLANT
LAVAGE JET IRRISEPT WOUND (IRRIGATION / IRRIGATOR) ×1 IMPLANT
MANIFOLD NEPTUNE II (INSTRUMENTS) ×2 IMPLANT
MARKER SKIN DUAL TIP RULER LAB (MISCELLANEOUS) ×2 IMPLANT
NDL SAFETY ECLIPSE 18X1.5 (NEEDLE) ×1 IMPLANT
NEEDLE HYPO 18GX1.5 SHARP (NEEDLE) ×1
NS IRRIG 1000ML POUR BTL (IV SOLUTION) ×2 IMPLANT
PADDING CAST COTTON 6X4 STRL (CAST SUPPLIES) ×4 IMPLANT
PROTECTOR NERVE ULNAR (MISCELLANEOUS) ×2 IMPLANT
SET HNDPC FAN SPRY TIP SCT (DISPOSABLE) ×1 IMPLANT
SET PAD KNEE POSITIONER (MISCELLANEOUS) ×2 IMPLANT
SPONGE LAP 18X18 RF (DISPOSABLE) ×2 IMPLANT
STAPLER VISISTAT 35W (STAPLE) ×2 IMPLANT
SUCTION FRAZIER HANDLE 12FR (TUBING) ×1
SUCTION TUBE FRAZIER 12FR DISP (TUBING) ×1 IMPLANT
SUT MNCRL AB 4-0 PS2 18 (SUTURE) ×2 IMPLANT
SUT STRATAFIX PDS+ 0 24IN (SUTURE) ×4 IMPLANT
SUT VIC AB 1 CT1 36 (SUTURE) ×6 IMPLANT
SUT VIC AB 2-0 CT1 27 (SUTURE) ×4
SUT VIC AB 2-0 CT1 TAPERPNT 27 (SUTURE) ×4 IMPLANT
SUT VLOC 180 0 24IN GS25 (SUTURE) IMPLANT
SYR 50ML LL SCALE MARK (SYRINGE) ×2 IMPLANT
TOWEL OR 17X26 10 PK STRL BLUE (TOWEL DISPOSABLE) ×4 IMPLANT
TOWEL OR NON WOVEN STRL DISP B (DISPOSABLE) ×2 IMPLANT
TOWER CARTRIDGE SMART MIX (DISPOSABLE) ×2 IMPLANT
TRAY FOLEY MTR SLVR 16FR STAT (SET/KITS/TRAYS/PACK) ×2 IMPLANT
WATER STERILE IRR 1000ML POUR (IV SOLUTION) ×2 IMPLANT
WRAP KNEE MAXI GEL POST OP (GAUZE/BANDAGES/DRESSINGS) ×2 IMPLANT
YANKAUER SUCT BULB TIP 10FT TU (MISCELLANEOUS) ×2 IMPLANT

## 2018-09-21 NOTE — Transfer of Care (Signed)
Immediate Anesthesia Transfer of Care Note  Patient: [name]  Procedure(s) Performed: Resection of tibia versus both components with placement of antibiotic spacer (Left )  Patient Location: PACU  Anesthesia Type:Regional and Spinal  Level of Consciousness: awake, alert  and oriented  Airway & Oxygen Therapy: Patient Spontanous Breathing and Patient connected to face mask oxygen  Post-op Assessment: Report given to RN and Post -op Vital signs reviewed and stable  Post vital signs: Reviewed and stable  Last Vitals:  Vitals Value Taken Time  BP 109/76 09/21/18 1347  Temp    Pulse 71 09/21/18 1348  Resp 22 09/21/18 1348  SpO2 97 % 09/21/18 1348  Vitals shown include unvalidated device data.  Last Pain:  Vitals:   09/21/18 0800  TempSrc:   PainSc: 0-No pain         Complications: No apparent anesthesia complications

## 2018-09-21 NOTE — Anesthesia Procedure Notes (Signed)
Spinal  Patient location during procedure: OR Start time: 09/21/2018 11:15 AM End time: 09/21/2018 11:23 AM Staffing Anesthesiologist: Barnet Glasgow, MD Preanesthetic Checklist Completed: patient identified, site marked, surgical consent, pre-op evaluation, timeout performed, IV checked, risks and benefits discussed and monitors and equipment checked Spinal Block Patient position: sitting Prep: DuraPrep Patient monitoring: heart rate, cardiac monitor, continuous pulse ox and blood pressure Approach: midline Location: L3-4 Injection technique: single-shot Needle Needle type: Sprotte  Needle gauge: 24 G Needle length: 12.7 cm Needle insertion depth: 12.7 cm Assessment Sensory level: T4 Additional Notes I used the touhy needle as an introducer

## 2018-09-21 NOTE — Progress Notes (Signed)
Pharmacy Antibiotic Note  [name] is a 68 y.o. 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  Pharmacy has been consulted for vancomycin dosing.  Plan:  Vancomycin 2000 mg IV now, then 1250 mg IV q12 hr (est AUC 495 based on SCr 1.08 and Vd 0.5)  Measure vancomycin AUC at steady state as indicated  SCr q48 hr while on vanc  Height: 5\' 8"  (172.7 cm) Weight: (!) 387 lb 5.6 oz (175.7 kg) IBW/kg (Calculated) : 68.4  Temp (24hrs), Avg:98.3 F (36.8 C), Min:98.3 F (36.8 C), Max:98.3 F (36.8 C)  Recent Labs  Lab 09/15/18 1125  WBC 4.6  CREATININE 1.08    Estimated Creatinine Clearance: 104.5 mL/min (by C-G formula based on SCr of 1.08 mg/dL).    Allergies  Allergen Reactions  . Morphine Hives     Thank you for allowing pharmacy to be a part of this patient's care.  Reuel Boom, PharmD, BCPS [phone] 09/21/2018, 10:00 AM

## 2018-09-21 NOTE — Op Note (Signed)
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  PROCEDURE:   1.  Resection left tibial component, excisional and non-excisional debridement of a 10-inch incision including skin, subcutaneous tissue, nonviable scar tissue, synovium, bone. 2.  Placement of an antibiotic impregnated cement polyethylene insert. 3.  A nonexcisional debridement with 6 liters normal saline solution as well as 1 liter of IrriSept chlorhexidine solution.  SURGEON:  Paralee Cancel, MD  ASSISTANT:  Danae Orleans, PA-C.  ANESTHESIA:  Regional plus spinal.  SPECIMENS:  Left knee synovial fluid and synovial and scar tissue were sent to pathology for evaluation and management for Gram stain culture evaluation.  DRAINS:  None.  TOURNIQUET TIME:  58 minutes at 300 mmHg.  COMPLICATIONS:  None.  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  He was treated with a 2 stage reimplantation.  Unfortunately, he presented recently  with increasing pain.  It was obvious there was concern for loosening.  He had elevated sedimentation and C-reactive protein.  It was difficult to ascertain any fluid.  He was scheduled for resection of his knee due to concern for potential further  loosening and damage to his tibia despite the  concerns I had regarding his size and being operated on.  The risks were reviewed including the need for future surgery, the failure of this attempt, the purpose and goal of attempt to try to reduce the  concern for infection down the road as well as the challenges that will be posed when we were to ever decide on doing any other procedures.  Consent was obtained for attempted management of this procedure in a 2-staged fashion.  PROCEDURE IN DETAIL:  The patient was brought to the operative theater.  Once adequate anesthesia, preoperative antibiotics initially held, but then administered once we started the procedure including vancomycin and Ancef.  He was positioned supine with  a left thigh tourniquet placed.  The left lower extremity was prepped and draped in sterile fashion using De Mayo leg holder.  Timeout was performed identifying the patient, the planned procedure and extremity.  His old incision was excised.  Soft  tissue planes created.  Median arthrotomy was made encountering abnormal appearing synovial fluid, but not obvious purulence.  This was aspirated from the joint using a syringe and sent to pathology for Gram stain culture evaluation.  Once this was done,  I exposed the knee by performing extensive synovectomy of the medial suprapatellar and lateral aspect of the joint with tissue sent to pathology for evaluation.  With the knee exposed, I was able to subluxate the knee easily and then using an osteotome,  remove the tibial component without bone loss.  At this point, I debrided around the proximal tibia with a combination of an oscillating saw, rongeurs and pituitary rongeurs.  We then reamed distally and opened up  the distal canal up to 14 mm and then  used a pulse lavage with a canal brush irrigator.  The knee was irrigated in the canal and the knee itself with 3 liters normal saline solution.  Once this was done, all the while performing the excisional debridement, removing nonviable  tissue, I then  irrigated with 1 liter of IrriSept solution, which was kept in the knee for 5 minutes.  It was then removed.  At this point, we elected to try to place a cemented antibiotic spacer.  Three batches of cement were mixed with 3 grams of vancomycin and 3.6  grams of tobramycin.  I then used a 20 mm TC3 polyethylene insert to match the 5 femur.  I then placed cement into the proximal tibia and then cemented in place the tibial tray on top of this.  The knee was brought in extended position to try to maintain  some orientation of alignment.  Once the cement had fully cured, we reirrigated the knee while this was done along with 3 liters normal saline solution.  I then sprinkled vancomycin powder into the deep knee wound.  The extensor mechanism was  reapproximated using #1 Vicryl and Stratafix sutures.  Tourniquet was let down at 58 minutes.  Any coagulation that was necessary was carried out.  The remainder of the wound was closed with 2-0 Vicryl and staples on the skin.  The skin was then cleaned,  dried and dressed sterilely using a bulky dressing with Xeroform.  The knee was wrapped in an Ace wrap, ice and a knee immobilizer.  Postoperatively, a PICC line will be ordered.  Pain control.  Antibiotics will include vancomycin and Ancef until  further directed by infectious disease.  PICC line will be ordered.  Partial limited weightbearing will be allowed.  A decision for reimplantation will be made within a 10-12 week period of time following 6 weeks of IV antibiotics.  TN/NUANCE  D:09/21/2018 T:09/21/2018 JOB:[id number]

## 2018-09-21 NOTE — Progress Notes (Signed)
AssistedDr. Houser with left, ultrasound guided, adductor canal block. Side rails up, monitors on throughout procedure. See vital signs in flow sheet. Tolerated Procedure well.  

## 2018-09-21 NOTE — Anesthesia Procedure Notes (Addendum)
Anesthesia Regional Block: Adductor canal block   Pre-Anesthetic Checklist: ,, timeout performed, Correct Patient, Correct Site, Correct Laterality, Correct Procedure, Correct Position, site marked, Risks and benefits discussed,  Surgical consent,  Pre-op evaluation,  At surgeon's request and post-op pain management  Laterality: Left  Prep: chloraprep       Needles:  Injection technique: Single-shot  Needle Type: Echogenic Needle     Needle Length: 9cm  Needle Gauge: 22     Additional Needles:   Procedures:,,,, ultrasound used (permanent image in chart),,,,  Narrative:  Start time: 09/21/2018 7:50 AM End time: 09/21/2018 8:01 AM Injection made incrementally with aspirations every 5 mL.  Performed by: Personally  Anesthesiologist: Barnet Glasgow, MD  Additional Notes: Block assessed prior to surgery. Pt tolerated procedure well.

## 2018-09-21 NOTE — Brief Op Note (Signed)
09/21/2018  10:20 AM  PATIENT:  [name]  68 y.o. male  PRE-OPERATIVE DIAGNOSIS:  Infection Left total knee arthroplasty  POST-OPERATIVE DIAGNOSIS:  Infection Left total knee arthroplasty  PROCEDURE:  Procedure(s) with comments: Resection of tibial component with placement of cemented antibiotic spacer  SURGEON:  Surgeon(s) and Role:    Paralee Cancel, MD - Primary  PHYSICIAN ASSISTANT: Danae Orleans, PA-C  ANESTHESIA:   regional and spinal  EBL:  <200 cc  BLOOD ADMINISTERED:none  DRAINS: none   LOCAL MEDICATIONS USED:  MARCAINE     SPECIMEN:  Source of Specimen:  left knee synovial fluid and tissue  DISPOSITION OF SPECIMEN:  PATHOLOGY  COUNTS:  YES  TOURNIQUET:  58 min at 300 mmHg  DICTATION: .Other Dictation: Dictation Number [id number]  PLAN OF CARE: Admit to inpatient   PATIENT DISPOSITION:  PACU - hemodynamically stable.   Delay start of Pharmacological VTE agent (>24hrs) due to surgical blood loss or risk of bleeding: no

## 2018-09-21 NOTE — Interval H&P Note (Signed)
History and Physical Interval Note:  09/21/2018 7:14 AM  [name]  has presented today for surgery, with the diagnosis of Infection Left total knee arthroplasty.  The various methods of treatment have been discussed with the patient and family. After consideration of risks, benefits and other options for treatment, the patient has consented to  Procedure(s) with comments: Resection of tibia versus both components with placement of antibiotic spacer (Left) - 2.5 hrs as a surgical intervention.  The patient's history has been reviewed, patient examined, no change in status, stable for surgery.  I have reviewed the patient's chart and labs.  Questions were answered to the patient's satisfaction.     Mauri Pole

## 2018-09-22 ENCOUNTER — Encounter (HOSPITAL_COMMUNITY): Payer: Self-pay | Admitting: Orthopedic Surgery

## 2018-09-22 DIAGNOSIS — Z89522 Acquired absence of left knee: Secondary | ICD-10-CM

## 2018-09-22 DIAGNOSIS — I428 Other cardiomyopathies: Secondary | ICD-10-CM

## 2018-09-22 DIAGNOSIS — Z86718 Personal history of other venous thrombosis and embolism: Secondary | ICD-10-CM

## 2018-09-22 DIAGNOSIS — I4821 Permanent atrial fibrillation: Secondary | ICD-10-CM

## 2018-09-22 DIAGNOSIS — I509 Heart failure, unspecified: Secondary | ICD-10-CM

## 2018-09-22 DIAGNOSIS — Z7901 Long term (current) use of anticoagulants: Secondary | ICD-10-CM

## 2018-09-22 DIAGNOSIS — T8454XA Infection and inflammatory reaction due to internal left knee prosthesis, initial encounter: Principal | ICD-10-CM

## 2018-09-22 LAB — CBC
HCT: 34.6 % — ABNORMAL LOW (ref 39.0–52.0)
Hemoglobin: 10.5 g/dL — ABNORMAL LOW (ref 13.0–17.0)
MCH: 29.6 pg (ref 26.0–34.0)
MCHC: 30.3 g/dL (ref 30.0–36.0)
MCV: 97.5 fL (ref 80.0–100.0)
Platelets: 232 10*3/uL (ref 150–400)
RBC: 3.55 MIL/uL — ABNORMAL LOW (ref 4.22–5.81)
RDW: 14.7 % (ref 11.5–15.5)
WBC: 6.5 10*3/uL (ref 4.0–10.5)
nRBC: 0 % (ref 0.0–0.2)

## 2018-09-22 LAB — BASIC METABOLIC PANEL
Anion gap: 6 (ref 5–15)
BUN: 17 mg/dL (ref 8–23)
CO2: 26 mmol/L (ref 22–32)
Calcium: 9.2 mg/dL (ref 8.9–10.3)
Chloride: 106 mmol/L (ref 98–111)
Creatinine, Ser: 0.83 mg/dL (ref 0.61–1.24)
GFR calc Af Amer: >60 mL/min (ref >60)
GFR calc non Af Amer: >60 mL/min (ref >60)
Glucose, Bld: 153 mg/dL — ABNORMAL HIGH (ref 70–99)
Potassium: 4.8 mmol/L (ref 3.5–5.1)
Sodium: 138 mmol/L (ref 135–145)

## 2018-09-22 MED ORDER — RIVAROXABAN 20 MG PO TABS
20.0000 mg | ORAL_TABLET | Freq: Every day | ORAL | Status: DC
  Administered 2018-09-23 – 2018-09-24 (×2): 20 mg via ORAL
  Filled 2018-09-22 (×4): qty 1

## 2018-09-22 MED ORDER — VANCOMYCIN HCL 10 G IV SOLR
1500.0000 mg | Freq: Two times a day (BID) | INTRAVENOUS | Status: DC
  Administered 2018-09-22 – 2018-09-24 (×4): 1500 mg via INTRAVENOUS
  Filled 2018-09-22 (×6): qty 1500

## 2018-09-22 NOTE — Progress Notes (Addendum)
Patient ID: [name], male   DOB: 13-Nov-1950, 68 y.o.   MRN: [id number] Subjective: 1 Day Post-Op Procedure(s) (LRB): Resection of tibia versus both components with placement of antibiotic spacer (Left)    Patient reports pain as mild.  No events over night  Objective:   VITALS:   Vitals:   09/22/18 0126 09/22/18 0600  BP: 115/74 113/77  Pulse: 72 80  Resp: 16 16  Temp: 98.4 F (36.9 C) 97.6 F (36.4 C)  SpO2: 92% 93%    Neurovascular intact Incision: dressing C/D/I  LABS Recent Labs    09/22/18 0604  HGB 10.5*  HCT 34.6*  WBC 6.5  PLT 232    Recent Labs    09/22/18 0604  NA 138  K 4.8  BUN 17  CREATININE 0.83  GLUCOSE 153*    No results for input(s): LABPT, INR in the last 72 hours.   Assessment/Plan: 1 Day Post-Op Procedure(s) (LRB): Resection of tibial component placement of antibiotic spacer   Advance diet Up with therapy Continue ABX therapy due to infected left total knee  ID consult today (Sed rate - 60, ESR - 27.8) Cultures pending  Will have nursing change dressing today to aquacell D/C pending selection of appropriate antibiotics Case management on board for home health

## 2018-09-22 NOTE — Progress Notes (Signed)
Pharmacy Antibiotic Note  [name] is a 68 y.o. 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  Pharmacy has been consulted for vancomycin dosing.  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    Allergies  Allergen Reactions  . Morphine Hives   Antimicrobials this admission: 8/13 vanc >>  8/13 cefazolin >>   Dose adjustments this admission: 8/14: increase vanc 1250 q12 >> 1500 q12 with improved CrCl  Microbiology results: 8/13 L knee tissue: ngtd 8/13 L knee synovial fluid: ngtd MRSA PCR neg 8/7   Thank you for allowing pharmacy to be a part of this patient's care.  Reuel Boom, PharmD, BCPS [phone] 09/22/2018, 1:21 PM

## 2018-09-22 NOTE — Anesthesia Postprocedure Evaluation (Signed)
Anesthesia Post Note  Patient: [name]  Procedure(s) Performed: Resection of tibia versus both components with placement of antibiotic spacer (Left )     Patient location during evaluation: PACU Anesthesia Type: Spinal Level of consciousness: awake Pain management: pain level controlled Vital Signs Assessment: post-procedure vital signs reviewed and stable Respiratory status: spontaneous breathing Cardiovascular status: stable Postop Assessment: no apparent nausea or vomiting, no headache, no backache, spinal receding and patient able to bend at knees Anesthetic complications: no    Last Vitals:  Vitals:   09/22/18 0126 09/22/18 0600  BP: 115/74 113/77  Pulse: 72 80  Resp: 16 16  Temp: 36.9 C 36.4 C  SpO2: 92% 93%    Last Pain:  Vitals:   09/22/18 0126  TempSrc: Oral  PainSc:    Pain Goal: Patients Stated Pain Goal: 4 (09/21/18 2023)                 Huston Foley

## 2018-09-22 NOTE — Progress Notes (Signed)
Physical Therapy Treatment Patient Details Name: [name] MRN: [id number] DOB: 09-30-1950 Today's Date: 09/22/2018    History of Present Illness Pt s/p L TKR resection and spacer placement.  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    PT Comments    Pt motivated and progressing with ambulation.  INcreased cueing for PWB on L LE   Follow Up Recommendations  Follow surgeon's recommendation for DC plan and follow-up therapies     Equipment Recommendations  Rolling walker with 5" wheels    Recommendations for Other Services       Precautions / Restrictions Precautions Precautions: Fall;Knee Restrictions Weight Bearing Restrictions: Yes LLE Weight Bearing: Partial weight bearing LLE Partial Weight Bearing Percentage or Pounds: 25%    Mobility  Bed Mobility Overal bed mobility: Needs Assistance Bed Mobility: Sit to Supine     Supine to sit: Min assist Sit to supine: Min assist   General bed mobility comments: cues for sequence and use of R LE to self assist.  Physical assist to mange L LE  Transfers Overall transfer level: Needs assistance Equipment used: Rolling walker (2 wheeled) Transfers: Sit to/from Stand Sit to Stand: Min assist;Mod assist         General transfer comment: cues for LE management and use of UEs to self assist  Ambulation/Gait Ambulation/Gait assistance: Min assist;+2 safety/equipment Gait Distance (Feet): 75 Feet Assistive device: Rolling walker (2 wheeled) Gait Pattern/deviations: Step-to pattern;Decreased step length - right;Decreased step length - left;Shuffle;Trunk flexed Gait velocity: decr   General Gait Details: cues for sequence, posture and position from [employer]             Wheelchair Mobility    Modified Rankin (Stroke Patients Only)       Balance Overall balance assessment: Mild deficits observed, not formally tested                                          Cognition Arousal/Alertness: Awake/alert Behavior During Therapy: WFL for tasks assessed/performed Overall Cognitive Status: Within Functional Limits for tasks assessed                                        Exercises      General Comments        Pertinent Vitals/Pain Pain Assessment: 0-10 Pain Score: 6  Pain Location: L knee Pain Descriptors / Indicators: Aching;Sore Pain Intervention(s): Limited activity within patient's tolerance;Monitored during session;Premedicated before session;Ice applied    Home Living Family/patient expects to be discharged to:: Private residence Living Arrangements: Alone   Type of Home: House Home Access: Stairs to enter Entrance Stairs-Rails: Right Home Layout: One level Home Equipment: Environmental consultant - 4 wheels      Prior Function Level of Independence: Independent with assistive device(s)      Comments: amb with cane vs Rollator   PT Goals (current goals can now be found in the care plan section) Acute Rehab PT Goals Patient Stated Goal: REgain IND PT Goal Formulation: With patient Time For Goal Achievement: 09/29/18 Potential to Achieve Goals: Good Progress towards PT goals: Progressing toward goals    Frequency    7X/week      PT Plan Current plan remains appropriate    Co-evaluation  AM-PAC PT "6 Clicks" Mobility   Outcome Measure  Help needed turning from your back to your side while in a flat bed without using bedrails?: A Little Help needed moving from lying on your back to sitting on the side of a flat bed without using bedrails?: A Little Help needed moving to and from a bed to a chair (including a wheelchair)?: A Little Help needed standing up from a chair using your arms (e.g., wheelchair or bedside chair)?: A Little Help needed to walk in hospital room?: A Little Help needed climbing 3-5 steps with a railing? : A Lot 6 Click Score: 17    End of Session Equipment Utilized During  Treatment: Left knee immobilizer Activity Tolerance: Patient tolerated treatment well Patient left: with call bell/phone within reach;with chair alarm set;in bed Nurse Communication: Mobility status PT Visit Diagnosis: Unsteadiness on feet (R26.81);Difficulty in walking, not elsewhere classified (R26.2)     Time: 1791-5056 PT Time Calculation (min) (ACUTE ONLY): 24 min  Charges:  $Gait Training: 8-22 mins                     [place] Pager [phone] Office [phone]    [name] 09/22/2018, 2:48 PM

## 2018-09-22 NOTE — Consult Note (Signed)
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   Of late, he has been having worsening pain, loosened HW and  Increasing inflammatory markers (sed rate 60 and CRP of 29) --> suggestive of ongoing infection. POD#1 form removal of tibial component of implant plus antibiotic spacer. He has been placed on vancomycin plus cefazolin. Since patient is on xarelto -> contra-indicated for rifampin.  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 Quentin Cornwall and  by Dr. Wendie Simmer in 2008. Patient had been treated for prosthetic joint infection in 2007 with 6 weeks of IV antibiotics with removal of antibiotic spacer and reimplantation of prosthesis. he was being observed off antibiotics when this April 2010 when he was admited in septic shock due once again to group C streptococci from his knee. He was  sp i and d and removal of prosthetic material. He was continued on parenteral antibiotics througout most of the month of April 2010 and then discharged on an additional 21 d of rocephin 1 g daily. Was on oral penicillin through 2014/2015  Past Medical History:  Diagnosis Date  . CHF (congestive heart failure) ([place])   . Chronic anticoagulation    with Xarelto  . Complication of anesthesia    PT STATES HARD TO WAKE UP AFTER ONE SUGERY -STATES THE  SURGERY TOOK LONGER THAN EXPECTED.  NO PROBLEMS WITH ANY OTHER SURGERY  . Dysrhythmia    A-fib  . GERD (gastroesophageal reflux disease)   . Headache   . History of blood clots   . History of blood transfusion   . Hypothyroidism   . Pain    BACK PAIN - PT ATTRIBUTES TO THE WAY HE WALKS DUE TO LEFT KNEE PROBLEM  . Persistent atrial fibrillation   . Septic arthritis of knee (HCC)    LEFT KNEE  . Shortness of breath    WITH EXERTION AND PAIN  . Sleep apnea    uses CPAP,    Allergies:  Allergies  Allergen Reactions  . Morphine Hives     MEDICATIONS: . celecoxib  200 mg Oral BID  . diltiazem  240 mg Oral Daily  . docusate sodium  100 mg Oral BID  . ferrous sulfate  325 mg Oral BID WC  . furosemide  20 mg Oral BID  . levothyroxine  125 mcg Oral Q0600  . metoprolol tartrate  50 mg Oral BID  . polyethylene glycol  17 g Oral BID  . potassium chloride  10 mEq Oral Daily  . rivaroxaban  10 mg Oral Q24H  . tamsulosin  0.4 mg Oral Daily    Social History   Tobacco Use  . Smoking status: Never Smoker  . Smokeless tobacco: Never Used  Substance Use Topics  . Alcohol use: Yes    Comment: 1-2 drinks per month , 09-15-2018 "even less  than that now "  . Drug use: No  - enjoys woodworking  Family History  Problem Relation Age of Onset  . Heart disease Father 65  . Pancreatic cancer Sister   . Liver cancer Sister   . Cervical cancer Sister   . Breast cancer Sister   . Diabetes Sister   . Colon cancer Neg Hx   . Throat cancer Neg Hx   . Stomach cancer Neg Hx   . Kidney disease Neg Hx   . Liver disease Neg Hx     Review of Systems -   Constitutional: Negative for fever, chills, diaphoresis, activity change, appetite change, fatigue and unexpected weight change.  HENT: Negative for congestion, sore throat, rhinorrhea, sneezing, trouble swallowing and sinus pressure.  Eyes: Negative for photophobia and visual disturbance.  Respiratory: Negative for cough, chest tightness,  shortness of breath, wheezing and stridor.  Cardiovascular: Negative for chest pain, palpitations and leg swelling.  Gastrointestinal: Negative for nausea, vomiting, abdominal pain, diarrhea, constipation, blood in stool, abdominal distention and anal bleeding.  Genitourinary: Negative for dysuria, hematuria, flank pain and difficulty urinating.  Musculoskeletal: + tenderness to left knee, with decrease range of motion. Negative for myalgias, back pain, joint swelling, arthralgias and gait problem.  Skin: Negative for color change, pallor, rash and wound.  Neurological: Negative for dizziness, tremors, weakness and light-headedness.  Hematological: Negative for adenopathy. Does not bruise/bleed easily.  Psychiatric/Behavioral: Negative for behavioral problems, confusion, sleep disturbance, dysphoric mood, decreased concentration and agitation.      OBJECTIVE: Temp:  [97.5 F (36.4 C)-98.4 F (36.9 C)] 98 F (36.7 C) (08/14 1412) Pulse Rate:  [72-86] 75 (08/14 1412) Resp:  [16-20] 16 (08/14 1412) BP: (105-122)/(67-77) 122/69 (08/14 1412) SpO2:  [92 %-100 %] 94 % (08/14 1412) Physical Exam  Constitutional: He is oriented to person, place, and time. He appears well-developed and well-nourished. No distress.  HENT:  Mouth/Throat: Oropharynx is clear and moist. No oropharyngeal exudate.  Cardiovascular: Normal rate, regular rhythm and normal heart sounds. Exam reveals no gallop and no friction rub.  No murmur heard.  Pulmonary/Chest: Effort normal and breath sounds normal. No respiratory distress. He has no wheezes.  Abdominal: Soft. Obese contour. Bowel sounds are normal. He exhibits no distension. There is no tenderness.  Ext: left knee wrapped with ice packs in place Neurological: He is alert and oriented to person, place, and time.  Skin: Skin is warm and dry. No rash noted. No erythema.  Psychiatric: He has a normal mood and affect. His behavior is normal.     LABS: Results for  orders placed or performed during the hospital encounter of 09/21/18 (from the past 48 hour(s))  Body fluid culture     Status: None (Preliminary result)   Collection Time: 09/21/18 12:18 PM   Specimen: Synovium  Result Value Ref Range   Specimen Description      SYNOVIAL LT KNEE Performed at [facility] [address] [place], [place] [address]    Special Requests      NONE Performed at [facility], [place] [address] [place], [place] [address]    Gram Stain      FEW WBC PRESENT, PREDOMINANTLY PMN NO ORGANISMS SEEN    Culture      NO GROWTH < 24 HOURS Performed at [facility] [address] [place], [place] [address]    Report Status PENDING   Aerobic/Anaerobic Culture (surgical/deep wound)     Status: None (Preliminary  result)   Collection Time: 09/21/18 12:25 PM   Specimen: Synovial, Left Knee; Body Fluid  Result Value Ref Range   Specimen Description      TISSUE LT KNEE Performed at [facility] [address] [place], [place] [address]    Special Requests      NONE Performed at [facility], [place] [address], [place], [place] [address]    Gram Stain      FEW WBC PRESENT,BOTH PMN AND MONONUCLEAR NO ORGANISMS SEEN    Culture      NO GROWTH < 24 HOURS Performed at [facility] [address], [place], [place] [address]    Report Status PENDING   CBC     Status: Abnormal   Collection Time: 09/22/18  6:04 AM  Result Value Ref Range   WBC 6.5 4.0 - 10.5 K/uL   RBC 3.55 (L) 4.22 - 5.81 MIL/uL   Hemoglobin 10.5 (L) 13.0 - 17.0 g/dL   HCT 34.6 (L) 39.0 - 52.0 %   MCV 97.5 80.0 - 100.0 fL   MCH 29.6 26.0 - 34.0 pg   MCHC 30.3 30.0 - 36.0 g/dL   RDW 14.7 11.5 - 15.5 %   Platelets 232 150 - 400 K/uL   nRBC 0.0 0.0 - 0.2 %    Comment: Performed at [facility], [place] [address] [place], [place] [address]  Basic metabolic panel     Status: Abnormal    Collection Time: 09/22/18  6:04 AM  Result Value Ref Range   Sodium 138 135 - 145 mmol/L   Potassium 4.8 3.5 - 5.1 mmol/L   Chloride 106 98 - 111 mmol/L   CO2 26 22 - 32 mmol/L   Glucose, Bld 153 (H) 70 - 99 mg/dL   BUN 17 8 - 23 mg/dL   Creatinine, Ser 0.83 0.61 - 1.24 mg/dL   Calcium 9.2 8.9 - 10.3 mg/dL   GFR calc non Af Amer >60 >60 mL/min   GFR calc Af Amer >60 >60 mL/min   Anion gap 6 5 - 15    Comment: Performed at [facility], [place] [address] [place], [place] [address]    MICRO: pending IMAGING: [place] Ekg Site Rite  Result Date: 09/21/2018 If Site Rite image not attached, placement could not be confirmed due to current cardiac rhythm.   Assessment/Plan:    68yo M with chronic pji of right knee s/p tibial component resection plus abtx spacer - currently on vanco and cefazolin  - await cx results - will need to get picc line - plan on 6 wk. If cx negative - would treat with vanco plus ceftriaxone 2gm IV daily  Afib/hx of VTE= today at xarelto at 10mg  (lower dose per ortho) and tomorrow. But will need to increase xarelto to his home dose of 20mg  daily thereafter. High risk for clotting given his history  Dr Tommy Medal to see tomorrow

## 2018-09-22 NOTE — Evaluation (Signed)
Physical Therapy Evaluation Patient Details Name: [name] MRN: [id number] DOB: [birth year] Today's Date: 09/22/2018   History of Present Illness  Pt s/p L TKR resection and spacer placement.  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  Clinical Impression  Pt admitted as above and presenting with functional mobility limitations 2* decreased L LE strength/ROM, post op pain, 25% PWB and obesity.  Pt hopes to progress to dc home but has minimal assist available.    Follow Up Recommendations Follow surgeon's recommendation for DC plan and follow-up therapies    Equipment Recommendations  Rolling walker with 5" wheels(wide RW, pt is ~380lbs)    Recommendations for Other Services       Precautions / Restrictions Precautions Precautions: Fall;Knee Restrictions Weight Bearing Restrictions: Yes LLE Weight Bearing: Partial weight bearing LLE Partial Weight Bearing Percentage or Pounds: 25%      Mobility  Bed Mobility Overal bed mobility: Needs Assistance Bed Mobility: Supine to Sit     Supine to sit: Min assist     General bed mobility comments: cues for sequence and use of R LE to self assist.  Physical assist to mange L LE  Transfers Overall transfer level: Needs assistance Equipment used: Rolling walker (2 wheeled) Transfers: Sit to/from Stand Sit to Stand: Min assist;From elevated surface         General transfer comment: cues for LE management and use of UEs to self assist  Ambulation/Gait Ambulation/Gait assistance: Min assist;+2 safety/equipment Gait Distance (Feet): 60 Feet Assistive device: Rolling walker (2 wheeled) Gait Pattern/deviations: Step-to pattern;Decreased step length - right;Decreased step length - left;Shuffle;Trunk flexed Gait velocity: decr   General Gait Details: cues for sequence, posture and position from [employer]            Wheelchair Mobility    Modified Rankin (Stroke Patients Only)        Balance Overall balance assessment: Mild deficits observed, not formally tested                                           Pertinent Vitals/Pain Pain Assessment: 0-10 Pain Score: 6  Pain Location: L knee Pain Descriptors / Indicators: Aching;Sore Pain Intervention(s): Limited activity within patient's tolerance;Monitored during session;Premedicated before session;Ice applied    Home Living Family/patient expects to be discharged to:: Private residence Living Arrangements: Alone   Type of Home: House Home Access: Stairs to enter Entrance Stairs-Rails: Right Entrance Stairs-Number of Steps: 2.5 Home Layout: One level Home Equipment: Environmental consultant - 4 wheels      Prior Function Level of Independence: Independent with assistive device(s)         Comments: amb with cane vs Rollator     Hand Dominance        Extremity/Trunk Assessment   Upper Extremity Assessment Upper Extremity Assessment: Overall WFL for tasks assessed    Lower Extremity Assessment Lower Extremity Assessment: LLE deficits/detail LLE Deficits / Details: KI in place       Communication   Communication: No difficulties  Cognition Arousal/Alertness: Awake/alert Behavior During Therapy: WFL for tasks assessed/performed Overall Cognitive Status: Within Functional Limits for tasks assessed  General Comments      Exercises     Assessment/Plan    PT Assessment Patient needs continued PT services  PT Problem List Decreased strength;Decreased range of motion;Decreased activity tolerance;Decreased mobility;Decreased knowledge of use of DME;Obesity;Pain       PT Treatment Interventions DME instruction;Gait training;Stair training;Functional mobility training;Therapeutic activities;Therapeutic exercise;Patient/family education    PT Goals (Current goals can be found in the Care Plan section)  Acute Rehab PT Goals Patient Stated  Goal: REgain IND PT Goal Formulation: With patient Time For Goal Achievement: 09/29/18 Potential to Achieve Goals: Good    Frequency 7X/week   Barriers to discharge Decreased caregiver support Home alone    Co-evaluation               AM-PAC PT "6 Clicks" Mobility  Outcome Measure Help needed turning from your back to your side while in a flat bed without using bedrails?: A Little Help needed moving from lying on your back to sitting on the side of a flat bed without using bedrails?: A Little Help needed moving to and from a bed to a chair (including a wheelchair)?: A Little Help needed standing up from a chair using your arms (e.g., wheelchair or bedside chair)?: A Little Help needed to walk in hospital room?: A Little Help needed climbing 3-5 steps with a railing? : A Lot 6 Click Score: 17    End of Session Equipment Utilized During Treatment: Left knee immobilizer Activity Tolerance: Patient tolerated treatment well Patient left: in chair;with call bell/phone within reach;with chair alarm set Nurse Communication: Mobility status PT Visit Diagnosis: Unsteadiness on feet (R26.81);Difficulty in walking, not elsewhere classified (R26.2)    Time: 1438-8875 PT Time Calculation (min) (ACUTE ONLY): 27 min   Charges:   PT Evaluation $PT Eval Low Complexity: 1 Low PT Treatments $Gait Training: 8-22 mins        [name] PT Acute Rehabilitation Services Pager [phone] Office [phone]   [name] 09/22/2018, 2:44 PM

## 2018-09-23 DIAGNOSIS — Z89529 Acquired absence of unspecified knee: Secondary | ICD-10-CM

## 2018-09-23 DIAGNOSIS — T8459XD Infection and inflammatory reaction due to other internal joint prosthesis, subsequent encounter: Secondary | ICD-10-CM

## 2018-09-23 DIAGNOSIS — Z792 Long term (current) use of antibiotics: Secondary | ICD-10-CM

## 2018-09-23 DIAGNOSIS — B954 Other streptococcus as the cause of diseases classified elsewhere: Secondary | ICD-10-CM

## 2018-09-23 DIAGNOSIS — Z8619 Personal history of other infectious and parasitic diseases: Secondary | ICD-10-CM

## 2018-09-23 DIAGNOSIS — M0020 Other streptococcal arthritis, unspecified joint: Secondary | ICD-10-CM

## 2018-09-23 LAB — BASIC METABOLIC PANEL
Anion gap: 5 (ref 5–15)
BUN: 18 mg/dL (ref 8–23)
CO2: 27 mmol/L (ref 22–32)
Calcium: 9.5 mg/dL (ref 8.9–10.3)
Chloride: 107 mmol/L (ref 98–111)
Creatinine, Ser: 0.79 mg/dL (ref 0.61–1.24)
GFR calc Af Amer: >60 mL/min (ref >60)
GFR calc non Af Amer: >60 mL/min (ref >60)
Glucose, Bld: 140 mg/dL — ABNORMAL HIGH (ref 70–99)
Potassium: 4.6 mmol/L (ref 3.5–5.1)
Sodium: 139 mmol/L (ref 135–145)

## 2018-09-23 LAB — CBC
HCT: 33.7 % — ABNORMAL LOW (ref 39.0–52.0)
Hemoglobin: 10 g/dL — ABNORMAL LOW (ref 13.0–17.0)
MCH: 28.8 pg (ref 26.0–34.0)
MCHC: 29.7 g/dL — ABNORMAL LOW (ref 30.0–36.0)
MCV: 97.1 fL (ref 80.0–100.0)
Platelets: 268 10*3/uL (ref 150–400)
RBC: 3.47 MIL/uL — ABNORMAL LOW (ref 4.22–5.81)
RDW: 15 % (ref 11.5–15.5)
WBC: 7.7 10*3/uL (ref 4.0–10.5)
nRBC: 0 % (ref 0.0–0.2)

## 2018-09-23 MED ORDER — SODIUM CHLORIDE 0.9 % IV SOLN
2.0000 g | INTRAVENOUS | Status: DC
  Administered 2018-09-23 – 2018-09-25 (×3): 2 g via INTRAVENOUS
  Filled 2018-09-23 (×3): qty 2

## 2018-09-23 NOTE — Progress Notes (Signed)
Physical Therapy Treatment Patient Details Name: [name] MRN: [id number] DOB: January 05, 1951 Today's Date: 09/23/2018    History of Present Illness Pt s/p L TKR resection and spacer placement.  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    PT Comments    Pt continues motivated and progressing steadily with mobility  Follow Up Recommendations  Follow surgeon's recommendation for DC plan and follow-up therapies     Equipment Recommendations  Rolling walker with 5" wheels    Recommendations for Other Services       Precautions / Restrictions Precautions Precautions: Fall;Knee Restrictions Weight Bearing Restrictions: Yes LLE Weight Bearing: Partial weight bearing LLE Partial Weight Bearing Percentage or Pounds: 25%    Mobility  Bed Mobility               General bed mobility comments: Pt up in chair and requests back to same  Transfers Overall transfer level: Needs assistance Equipment used: Rolling walker (2 wheeled) Transfers: Sit to/from Stand Sit to Stand: Min assist;Mod assist         General transfer comment: cues for LE management and use of UEs to self assist  Ambulation/Gait Ambulation/Gait assistance: Min assist;Min guard Gait Distance (Feet): 90 Feet Assistive device: Rolling walker (2 wheeled) Gait Pattern/deviations: Step-to pattern;Decreased step length - right;Decreased step length - left;Shuffle;Trunk flexed Gait velocity: decr   General Gait Details: cues for sequence, posture and position from [employer]             Wheelchair Mobility    Modified Rankin (Stroke Patients Only)       Balance Overall balance assessment: Mild deficits observed, not formally tested                                          Cognition Arousal/Alertness: Awake/alert Behavior During Therapy: WFL for tasks assessed/performed Overall Cognitive Status: Within Functional Limits for tasks assessed                                         Exercises      General Comments        Pertinent Vitals/Pain Pain Assessment: 0-10 Pain Score: 5  Pain Location: L knee Pain Descriptors / Indicators: Aching;Sore Pain Intervention(s): Limited activity within patient's tolerance;Monitored during session;Premedicated before session;Ice applied    Home Living                      Prior Function            PT Goals (current goals can now be found in the care plan section) Acute Rehab PT Goals Patient Stated Goal: REgain IND PT Goal Formulation: With patient Time For Goal Achievement: 09/29/18 Potential to Achieve Goals: Good Progress towards PT goals: Progressing toward goals    Frequency    7X/week      PT Plan Current plan remains appropriate    Co-evaluation              AM-PAC PT "6 Clicks" Mobility   Outcome Measure  Help needed turning from your back to your side while in a flat bed without using bedrails?: A Little Help needed moving from lying on your back to sitting on the side of  a flat bed without using bedrails?: A Little Help needed moving to and from a bed to a chair (including a wheelchair)?: A Little Help needed standing up from a chair using your arms (e.g., wheelchair or bedside chair)?: A Little Help needed to walk in hospital room?: A Little Help needed climbing 3-5 steps with a railing? : A Lot 6 Click Score: 17    End of Session Equipment Utilized During Treatment: Left knee immobilizer Activity Tolerance: Patient tolerated treatment well Patient left: with call bell/phone within reach;with chair alarm set;in bed Nurse Communication: Mobility status PT Visit Diagnosis: Unsteadiness on feet (R26.81);Difficulty in walking, not elsewhere classified (R26.2)     Time: 8110-3159 PT Time Calculation (min) (ACUTE ONLY): 26 min  Charges:  $Gait Training: 23-37 mins                     [place] Pager [phone] Office [phone]    [place] 09/23/2018, 12:26 PM

## 2018-09-23 NOTE — Progress Notes (Signed)
Patient ID: [name], male   DOB: [birth year], 68 y.o.   MRN: [id number] Subjective: 2 Days Post-Op Procedure(s) (LRB): Resection of tibia versus both components with placement of antibiotic spacer (Left)    Patient reports pain as mild.  Had increased urinary frequency overnight, but no dysuria.  Otherwise no cmplaints  Objective:   VITALS:   Vitals:   09/22/18 2110 09/23/18 0623  BP: 128/90 119/80  Pulse: 90 81  Resp: 17 20  Temp: 98 F (36.7 C) 97.7 F (36.5 C)  SpO2: 95% 92%    Neurovascular intact Incision: dressing C/D/I  LABS Recent Labs    09/22/18 0604 09/23/18 0249  HGB 10.5* 10.0*  HCT 34.6* 33.7*  WBC 6.5 7.7  PLT 232 268    Recent Labs    09/22/18 0604 09/23/18 0249  NA 138 139  K 4.8 4.6  BUN 17 18  CREATININE 0.83 0.79  GLUCOSE 153* 140*    No results for input(s): LABPT, INR in the last 72 hours.   Assessment/Plan: 2 Days Post-Op Procedure(s) (LRB): Resection of tibial component placement of antibiotic spacer   Advance diet Up with therapy Continue ABX therapy due to infected left total knee  ID consult - aprepciate recs.Marland Kitchen awaiting PICC and final cultures   D/C pending selection of appropriate antibiotics Case management on board for home health

## 2018-09-23 NOTE — Progress Notes (Signed)
Physical Therapy Treatment Patient Details Name: [name] MRN: [id number] DOB: 06-27-1950 Today's Date: 09/23/2018    History of Present Illness Pt s/p L TKR resection and spacer placement.  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 Therex program introduced.   Follow Up Recommendations  Follow surgeon's recommendation for DC plan and follow-up therapies     Equipment Recommendations  Rolling walker with 5" wheels    Recommendations for Other Services       Precautions / Restrictions Precautions Precautions: Fall;Knee Restrictions LLE Weight Bearing: Partial weight bearing LLE Partial Weight Bearing Percentage or Pounds: 25-50%; order for 25% - pt states Dr Alvan Dame told him up to 50% but less if he can    Mobility  Bed Mobility Overal bed mobility: Needs Assistance Bed Mobility: Sit to Supine       Sit to supine: Min guard   General bed mobility comments: cues for sequence and use of Leg Lifter to self assist  Transfers Overall transfer level: Needs assistance Equipment used: Rolling walker (2 wheeled) Transfers: Sit to/from Stand Sit to Stand: Min assist         General transfer comment: cues for LE management and use of UEs to self assist  Ambulation/Gait Ambulation/Gait assistance: Min guard Gait Distance (Feet): 100 Feet Assistive device: Rolling walker (2 wheeled) Gait Pattern/deviations: Step-to pattern;Decreased step length - right;Decreased step length - left;Shuffle;Trunk flexed Gait velocity: decr   General Gait Details: cues for sequence, posture, PWB status and position from [employer]             Wheelchair Mobility    Modified Rankin (Stroke Patients Only)       Balance Overall balance assessment: Mild deficits observed, not formally tested                                          Cognition  Arousal/Alertness: Awake/alert Behavior During Therapy: WFL for tasks assessed/performed Overall Cognitive Status: Within Functional Limits for tasks assessed                                        Exercises General Exercises - Lower Extremity Ankle Circles/Pumps: AROM;Both;20 reps;Supine Quad Sets: AROM;Both;10 reps;Supine Hip ABduction/ADduction: AAROM;Left;20 reps;Supine(Pt able to self assist with leg lifter; KI in place) Straight Leg Raises: AAROM;Left;10 reps;Supine(Pt able to self assist with leg lifter; KI in place)    General Comments        Pertinent Vitals/Pain Pain Assessment: 0-10 Pain Score: 5  Pain Location: L knee Pain Descriptors / Indicators: Aching;Sore Pain Intervention(s): Limited activity within patient's tolerance;Monitored during session;Premedicated before session;Ice applied    Home Living                      Prior Function            PT Goals (current goals can now be found in the care plan section) Acute Rehab PT Goals Patient Stated Goal: REgain IND PT Goal Formulation: With patient Time For Goal Achievement: 09/29/18 Potential to Achieve Goals: Good Progress towards PT goals: Progressing toward goals    Frequency    7X/week  PT Plan Current plan remains appropriate    Co-evaluation              AM-PAC PT "6 Clicks" Mobility   Outcome Measure  Help needed turning from your back to your side while in a flat bed without using bedrails?: A Little Help needed moving from lying on your back to sitting on the side of a flat bed without using bedrails?: A Little Help needed moving to and from a bed to a chair (including a wheelchair)?: A Little Help needed standing up from a chair using your arms (e.g., wheelchair or bedside chair)?: A Little Help needed to walk in hospital room?: A Little Help needed climbing 3-5 steps with a railing? : A Lot 6 Click Score: 17    End of Session Equipment Utilized  During Treatment: Left knee immobilizer Activity Tolerance: Patient tolerated treatment well Patient left: in bed;with call bell/phone within reach Nurse Communication: Mobility status PT Visit Diagnosis: Unsteadiness on feet (R26.81);Difficulty in walking, not elsewhere classified (R26.2)     Time: 3254-9826 PT Time Calculation (min) (ACUTE ONLY): 50 min  Charges:  $Gait Training: 23-37 mins $Therapeutic Exercise: 8-22 mins                     [place] Pager [phone] Office [phone]    Makyna Niehoff 09/23/2018, 5:29 PM

## 2018-09-23 NOTE — TOC Initial Note (Signed)
Transition of Care [facility]) - Initial/Assessment Note    Patient Details  Name: [name] MRN: [id number] Date of Birth: 1950/08/31  Transition of Care (TOC) CM/SW Contact:    Joaquin Courts, RN Phone Number: 09/23/2018, 11:56 AM  Clinical Narrative:   CM spoke with patient at bedside, who reports he wishes to go to outpatient physical therapy after discharge rather then home health PT.  Patient states he has a 3-in-1 at home. [employer] arranged to deliver rolling walker to bedside for home use.                 Expected Discharge Plan: Home/Self Care Barriers to Discharge: Continued Medical Work up   Patient Goals and CMS Choice Patient states their goals for this hospitalization and ongoing recovery are:: to go home with outpatient physical therapy      Expected Discharge Plan and Services Expected Discharge Plan: Home/Self Care   Discharge Planning Services: CM Consult   Living arrangements for the past 2 months: Single Family Home                 DME Arranged: Walker rolling DME Agency: [employer] Date DME Agency Contacted: 09/23/18 Time DME Agency Contacted: 1156 Representative spoke with at DME Agency: [place]: NA [place] Agency: NA        Prior Living Arrangements/Services Living arrangements for the past 2 months: [place] with:: Self Patient language and need for interpreter reviewed:: Yes Do you feel safe going back to the place where you live?: Yes      Need for Family Participation in Patient Care: Yes (Comment) Care giver support system in place?: Yes (comment)   Criminal Activity/Legal Involvement Pertinent to Current Situation/Hospitalization: No - Comment as needed  Activities of Daily Living Home Assistive Devices/Equipment: Walker (specify type), CPAP, Eyeglasses ADL Screening (condition at time of admission) Patient's cognitive ability adequate to safely complete daily activities?: Yes Is the patient deaf or have  difficulty hearing?: No Does the patient have difficulty seeing, even when wearing glasses/contacts?: No Does the patient have difficulty concentrating, remembering, or making decisions?: No Patient able to express need for assistance with ADLs?: Yes Does the patient have difficulty dressing or bathing?: No Independently performs ADLs?: Yes (appropriate for developmental age) Does the patient have difficulty walking or climbing stairs?: Yes Weakness of Legs: Both Weakness of Arms/Hands: None  Permission Sought/Granted                  Emotional Assessment Appearance:: Appears stated age Attitude/Demeanor/Rapport: Engaged Affect (typically observed): Accepting Orientation: : Oriented to Place, Oriented to  Time, Oriented to Situation, Oriented to Self   Psych Involvement: No (comment)  Admission diagnosis:  Infection Left total knee arthroplasty Patient Active Problem List   Diagnosis Date Noted  . 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 S/P rev left TK 12/15/2015  . 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 Preoperative cardiovascular examination 04/17/2013  . OSA on CPAP 11/27/2012  . Chronic diastolic heart failure ([place]) 09/10/2012  . Chronic anticoagulation, with Xarelto 09/10/2012  . DVT (deep venous thrombosis), possible 09/10/2012  . SOB (shortness of breath) 08/26/2012  . Chest discomfort 08/26/2012  . Persistent atrial fibrillation 08/26/2012  . Super obesity 06/27/2012  . Expected blood loss anemia 06/27/2012  . S/P left TK revision 06/23/2012  . Septic arthritis of knee ([place])   . Cellulitis 09/19/2010  .  METHICILLIN RESISTANT STAPHYLOCOCCUS AUREUS INFECTION 09/10/2009  . 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 DM 07/04/2008  . CHRONIC KIDNEY DISEASE UNSPECIFIED 07/04/2008  . PULMONARY EMBOLISM, HX OF 07/04/2008  . INFECTION DUE TO INTERNAL  ORTH DEVICE NEC 03/02/2006   PCP:  Bernerd Limbo, MD Pharmacy:   [facility], [address] [place] [address] Phone: [phone] Fax: [phone]     Social Determinants of Health (SDOH) Interventions    Readmission Risk Interventions No flowsheet data found.

## 2018-09-23 NOTE — Progress Notes (Signed)
Subjective: No new complaints   Antibiotics:  Anti-infectives (From admission, onward)   Start     Dose/Rate Route Frequency Ordered Stop   09/22/18 2000  vancomycin (VANCOCIN) 1,500 mg in sodium chloride 0.9 % 500 mL IVPB     1,500 mg 250 mL/hr over 120 Minutes Intravenous Every 12 hours 09/22/18 1331     09/22/18 0300  ceFAZolin (ANCEF) IVPB 2g/100 mL premix     2 g 200 mL/hr over 30 Minutes Intravenous Every 8 hours 09/21/18 2056     09/21/18 2200  rifampin (RIFADIN) capsule 300 mg  Status:  Discontinued     300 mg Oral Every 12 hours 09/21/18 1556 09/21/18 2053   09/21/18 2200  vancomycin (VANCOCIN) 1,250 mg in sodium chloride 0.9 % 250 mL IVPB  Status:  Discontinued     1,250 mg 166.7 mL/hr over 90 Minutes Intravenous Every 12 hours 09/21/18 0959 09/22/18 1331   09/21/18 1800  ceFAZolin (ANCEF) IVPB 2g/100 mL premix  Status:  Discontinued     2 g 200 mL/hr over 30 Minutes Intravenous Every 6 hours 09/21/18 1556 09/21/18 2056   09/21/18 1206  tobramycin (NEBCIN) powder  Status:  Discontinued       As needed 09/21/18 1206 09/21/18 1345   09/21/18 1206  vancomycin (VANCOCIN) powder  Status:  Discontinued       As needed 09/21/18 1206 09/21/18 1345   09/21/18 1000  vancomycin (VANCOCIN) 2,000 mg in sodium chloride 0.9 % 500 mL IVPB     2,000 mg 250 mL/hr over 120 Minutes Intravenous  Once 09/21/18 0953 09/21/18 1324      Medications: Scheduled Meds: . celecoxib  200 mg Oral BID  . diltiazem  240 mg Oral Daily  . docusate sodium  100 mg Oral BID  . ferrous sulfate  325 mg Oral BID WC  . furosemide  20 mg Oral BID  . levothyroxine  125 mcg Oral Q0600  . metoprolol tartrate  50 mg Oral BID  . polyethylene glycol  17 g Oral BID  . potassium chloride  10 mEq Oral Daily  . rivaroxaban  20 mg Oral Q breakfast  . tamsulosin  0.4 mg Oral Daily   Continuous Infusions: . sodium chloride 30 mL/hr at 09/23/18 0200  .  ceFAZolin (ANCEF) IV 2 g (09/23/18 1201)  .  methocarbamol (ROBAXIN) IV    . vancomycin 1,500 mg (09/23/18 0837)   PRN Meds:.acetaminophen, alum & mag hydroxide-simeth, bisacodyl, diphenhydrAMINE, fentaNYL (SUBLIMAZE) injection, HYDROcodone-acetaminophen, HYDROcodone-acetaminophen, magnesium citrate, menthol-cetylpyridinium **OR** phenol, methocarbamol **OR** methocarbamol (ROBAXIN) IV, metoCLOPramide **OR** metoCLOPramide (REGLAN) injection, ondansetron **OR** ondansetron (ZOFRAN) IV    Objective: Weight change:   Intake/Output Summary (Last 24 hours) at 09/23/2018 1242 Last data filed at 09/23/2018 0900 Gross per 24 hour  Intake 2780.43 ml  Output 3700 ml  Net -919.57 ml   Blood pressure 119/80, pulse 81, temperature 97.7 F (36.5 C), temperature source Oral, resp. rate 20, height 5\' 8"  (1.727 m), weight (!) 175.7 kg, SpO2 92 %. Temp:  [97.7 F (36.5 C)-98 F (36.7 C)] 97.7 F (36.5 C) (08/15 0623) Pulse Rate:  [75-90] 81 (08/15 0623) Resp:  [16-20] 20 (08/15 0623) BP: (119-128)/(69-90) 119/80 (08/15 0623) SpO2:  [92 %-95 %] 92 % (08/15 2836)  Physical Exam: General: Alert and awake, oriented x3, not in any acute distress. HEENT: anicteric sclera, EOMI CVS regular rate, normal  Chest: , no wheezing, no respiratory distress Abdomen: soft non-distended,  Extremities:  Knee wrapped in bandage Skin: no rashes Neuro: nonfocal  CBC:    BMET Recent Labs    09/22/18 0604 09/23/18 0249  NA 138 139  K 4.8 4.6  CL 106 107  CO2 26 27  GLUCOSE 153* 140*  BUN 17 18  CREATININE 0.83 0.79  CALCIUM 9.2 9.5     Liver Panel  No results for input(s): PROT, ALBUMIN, AST, ALT, ALKPHOS, BILITOT, BILIDIR, IBILI in the last 72 hours.     Sedimentation Rate No results for input(s): ESRSEDRATE in the last 72 hours. C-Reactive Protein No results for input(s): CRP in the last 72 hours.  Micro Results: Recent Results (from the past 720 hour(s))  Surgical pcr screen     Status: None   Collection Time: 09/15/18 11:25 AM    Specimen: Nasal Mucosa; Nasal Swab  Result Value Ref Range Status   MRSA, PCR NEGATIVE NEGATIVE Final   Staphylococcus aureus NEGATIVE NEGATIVE Final    Comment: (NOTE) The Xpert SA Assay (FDA approved for NASAL specimens in patients 44 years of age and older), is one component of a comprehensive surveillance program. It is not intended to diagnose infection nor to guide or monitor treatment. Performed at [facility], [place] [address] [place], [place] [address]   SARS CORONAVIRUS 2 Nasal Swab Aptima Multi Swab     Status: None   Collection Time: 09/18/18  9:45 AM   Specimen: Aptima Multi Swab; Nasal Swab  Result Value Ref Range Status   SARS Coronavirus 2 NEGATIVE NEGATIVE Final    Comment: (NOTE) SARS-CoV-2 target nucleic acids are NOT DETECTED. The SARS-CoV-2 RNA is generally detectable in upper and lower respiratory specimens during the acute phase of infection. Negative results do not preclude SARS-CoV-2 infection, do not rule out co-infections with other pathogens, and should not be used as the sole basis for treatment or other patient management decisions. Negative results must be combined with clinical observations, patient history, and epidemiological information. The expected result is Negative. Fact Sheet for Patients: SugarRoll.be Fact Sheet for Healthcare Providers: https://www.woods-mathews.com/ This test is not yet approved or cleared by the [place] FDA and  has been authorized for detection and/or diagnosis of SARS-CoV-2 by FDA under an Emergency Use Authorization (EUA). This EUA will remain  in effect (meaning this test can be used) for the duration of the COVID-19 declaration under Section 56 4(b)(1) of the Act, 21 U.S.C. section 360bbb-3(b)(1), unless the authorization is terminated or revoked sooner. Performed at [facility], [place] [address] [place], [place] [address]   Anaerobic  culture     Status: None (Preliminary result)   Collection Time: 09/21/18 12:18 PM   Specimen: Synovium  Result Value Ref Range Status   Specimen Description   Final    SYNOVIAL LT KNEE Performed at [place] [address], [place], [place] [address]    Special Requests   Final    NONE Performed at [facility], [place] [address] [place], [place] [address]    Culture   Final    NO ANAEROBES ISOLATED; CULTURE IN PROGRESS FOR 5 DAYS   Report Status PENDING  Incomplete  Body fluid culture     Status: None (Preliminary result)   Collection Time: 09/21/18 12:18 PM   Specimen: Synovium  Result Value Ref Range Status   Specimen Description   Final    SYNOVIAL LT KNEE Performed at [place] [address] [place], [place]  [address]    Special Requests   Final    NONE Performed at [facility], [place] [address], [place], [place] [address]    Gram Stain   Final    FEW WBC PRESENT, PREDOMINANTLY PMN NO ORGANISMS SEEN    Culture   Final    NO GROWTH 2 DAYS Performed at [place] [address] [place], [place] [address]    Report Status PENDING  Incomplete  Aerobic/Anaerobic Culture (surgical/deep wound)     Status: None (Preliminary result)   Collection Time: 09/21/18 12:25 PM   Specimen: Synovial, Left Knee; Body Fluid  Result Value Ref Range Status   Specimen Description   Final    TISSUE LT KNEE Performed at [place] [address], [place], [place] [address]    Special Requests   Final    NONE Performed at [facility], [place] [address], [place], [place] [address]    Gram Stain   Final    FEW WBC PRESENT,BOTH PMN AND MONONUCLEAR NO ORGANISMS SEEN    Culture   Final    NO GROWTH 2 DAYS NO ANAEROBES ISOLATED; CULTURE IN PROGRESS FOR 5 DAYS Performed at [facility], [place] [address], [place], [place] [address]    Report Status PENDING   Incomplete    Studies/Results: [place] Ekg Site Rite  Result Date: 09/21/2018 If Site Rite image not attached, placement could not be confirmed due to current cardiac rhythm.     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  He had done well when I last saw him and been off antibiotics for 4 years after implantation of a new prosthetic knee.  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  Cultures so far unrevealing.  Continue vancomycin and exchange cefazolin for CTX for dosing convenience    LOS: 2 days   Alcide Evener 09/23/2018, 12:42 PM

## 2018-09-23 NOTE — Progress Notes (Signed)
Attempted PICC placement R cephalic vein.  Unable to thread into SVC, with sharp turn around in the brachiocephalic region then threading to mammary region.  Pt states he could feel something in his RCW/breast area.  States he had a PICC in 2010 that they had trouble with and had to have radiology place it.  Dressing applied to insertion site.  RN notifying MD.

## 2018-09-23 NOTE — Plan of Care (Signed)
  Problem: Clinical Measurements: Goal: Respiratory complications will improve Outcome: Progressing   Problem: Activity: Goal: Risk for activity intolerance will decrease Outcome: Progressing   Problem: Nutrition: Goal: Adequate nutrition will be maintained Outcome: Progressing   Problem: Elimination: Goal: Will not experience complications related to urinary retention Outcome: Progressing   Problem: Pain Managment: Goal: General experience of comfort will improve Outcome: Progressing   Problem: Safety: Goal: Ability to remain free from injury will improve Outcome: Progressing   Problem: Education: Goal: Knowledge of the prescribed therapeutic regimen will improve Outcome: Progressing

## 2018-09-24 DIAGNOSIS — Z885 Allergy status to narcotic agent status: Secondary | ICD-10-CM

## 2018-09-24 LAB — BODY FLUID CULTURE: Culture: NO GROWTH

## 2018-09-24 LAB — VANCOMYCIN, PEAK
Vancomycin Pk: 34 ug/mL (ref 30–40)
Vancomycin Pk: 33 ug/mL (ref 30–40)

## 2018-09-24 LAB — CREATININE, SERUM
Creatinine, Ser: 0.92 mg/dL (ref 0.61–1.24)
GFR calc Af Amer: >60 mL/min (ref >60)
GFR calc non Af Amer: >60 mL/min (ref >60)

## 2018-09-24 LAB — VANCOMYCIN, TROUGH: Vancomycin Tr: 18 ug/mL (ref 15–20)

## 2018-09-24 MED ORDER — HYDROCODONE-ACETAMINOPHEN 7.5-325 MG PO TABS: 1.0000 | ORAL_TABLET | ORAL | 0 refills | Status: DC | PRN

## 2018-09-24 MED ORDER — POLYETHYLENE GLYCOL 3350 17 G PO PACK: 17.0000 g | PACK | Freq: Two times a day (BID) | ORAL | 0 refills | Status: DC

## 2018-09-24 MED ORDER — METHOCARBAMOL 500 MG PO TABS: 500.0000 mg | ORAL_TABLET | Freq: Four times a day (QID) | ORAL | 0 refills | Status: DC | PRN

## 2018-09-24 MED ORDER — VANCOMYCIN IV (FOR PTA / DISCHARGE USE ONLY): 1250.0000 mg | Freq: Two times a day (BID) | INTRAVENOUS | 0 refills | Status: AC

## 2018-09-24 MED ORDER — FERROUS SULFATE 325 (65 FE) MG PO TABS: 325.0000 mg | ORAL_TABLET | Freq: Three times a day (TID) | ORAL | 0 refills | Status: DC

## 2018-09-24 MED ORDER — DOCUSATE SODIUM 100 MG PO CAPS: 100.0000 mg | ORAL_CAPSULE | Freq: Two times a day (BID) | ORAL | 0 refills | Status: DC

## 2018-09-24 MED ORDER — CEFTRIAXONE IV (FOR PTA / DISCHARGE USE ONLY): 2.0000 g | INTRAVENOUS | 0 refills | Status: AC

## 2018-09-24 MED ORDER — VANCOMYCIN HCL 10 G IV SOLR
1250.0000 mg | Freq: Two times a day (BID) | INTRAVENOUS | Status: DC
  Administered 2018-09-24 – 2018-09-25 (×2): 1250 mg via INTRAVENOUS
  Filled 2018-09-24 (×3): qty 1250

## 2018-09-24 NOTE — Progress Notes (Signed)
Subjective:  Pt w concerns that he will need an amputation down the road if efforts to treat his PJI fail   Antibiotics:  Anti-infectives (From admission, onward)   Start     Dose/Rate Route Frequency Ordered Stop   09/24/18 2200  vancomycin (VANCOCIN) 1,250 mg in sodium chloride 0.9 % 250 mL IVPB     1,250 mg 166.7 mL/hr over 90 Minutes Intravenous Every 12 hours 09/24/18 1440     09/23/18 1400  cefTRIAXone (ROCEPHIN) 2 g in sodium chloride 0.9 % 100 mL IVPB     2 g 200 mL/hr over 30 Minutes Intravenous Every 24 hours 09/23/18 1245     09/22/18 2000  vancomycin (VANCOCIN) 1,500 mg in sodium chloride 0.9 % 500 mL IVPB  Status:  Discontinued     1,500 mg 250 mL/hr over 120 Minutes Intravenous Every 12 hours 09/22/18 1331 09/24/18 1440   09/22/18 0300  ceFAZolin (ANCEF) IVPB 2g/100 mL premix  Status:  Discontinued     2 g 200 mL/hr over 30 Minutes Intravenous Every 8 hours 09/21/18 2056 09/23/18 1245   09/21/18 2200  rifampin (RIFADIN) capsule 300 mg  Status:  Discontinued     300 mg Oral Every 12 hours 09/21/18 1556 09/21/18 2053   09/21/18 2200  vancomycin (VANCOCIN) 1,250 mg in sodium chloride 0.9 % 250 mL IVPB  Status:  Discontinued     1,250 mg 166.7 mL/hr over 90 Minutes Intravenous Every 12 hours 09/21/18 0959 09/22/18 1331   09/21/18 1800  ceFAZolin (ANCEF) IVPB 2g/100 mL premix  Status:  Discontinued     2 g 200 mL/hr over 30 Minutes Intravenous Every 6 hours 09/21/18 1556 09/21/18 2056   09/21/18 1206  tobramycin (NEBCIN) powder  Status:  Discontinued       As needed 09/21/18 1206 09/21/18 1345   09/21/18 1206  vancomycin (VANCOCIN) powder  Status:  Discontinued       As needed 09/21/18 1206 09/21/18 1345   09/21/18 1000  vancomycin (VANCOCIN) 2,000 mg in sodium chloride 0.9 % 500 mL IVPB     2,000 mg 250 mL/hr over 120 Minutes Intravenous  Once 09/21/18 0953 09/21/18 1324      Medications: Scheduled Meds: . celecoxib  200 mg Oral BID  . diltiazem  240  mg Oral Daily  . docusate sodium  100 mg Oral BID  . ferrous sulfate  325 mg Oral BID WC  . furosemide  20 mg Oral BID  . levothyroxine  125 mcg Oral Q0600  . metoprolol tartrate  50 mg Oral BID  . polyethylene glycol  17 g Oral BID  . potassium chloride  10 mEq Oral Daily  . rivaroxaban  20 mg Oral Q breakfast  . tamsulosin  0.4 mg Oral Daily   Continuous Infusions: . sodium chloride 75 mL/hr at 09/24/18 1310  . cefTRIAXone (ROCEPHIN)  IV 2 g (09/24/18 1611)  . methocarbamol (ROBAXIN) IV    . vancomycin     PRN Meds:.acetaminophen, alum & mag hydroxide-simeth, bisacodyl, diphenhydrAMINE, fentaNYL (SUBLIMAZE) injection, HYDROcodone-acetaminophen, HYDROcodone-acetaminophen, magnesium citrate, menthol-cetylpyridinium **OR** phenol, methocarbamol **OR** methocarbamol (ROBAXIN) IV, metoCLOPramide **OR** metoCLOPramide (REGLAN) injection, ondansetron **OR** ondansetron (ZOFRAN) IV    Objective: Weight change:   Intake/Output Summary (Last 24 hours) at 09/24/2018 1643 Last data filed at 09/24/2018 1611 Gross per 24 hour  Intake 2797.1 ml  Output 3251 ml  Net -453.9 ml   Blood pressure 118/76, pulse 69, temperature 97.7 F (36.5 C),  temperature source Oral, resp. rate 18, height '5\' 8"'  (1.727 m), weight (!) 175.7 kg, SpO2 98 %. Temp:  [97.4 F (36.3 C)-98 F (36.7 C)] 97.7 F (36.5 C) (08/16 1319) Pulse Rate:  [65-76] 69 (08/16 1426) Resp:  [16-18] 18 (08/16 1326) BP: (90-131)/(66-80) 118/76 (08/16 1426) SpO2:  [95 %-98 %] 98 % (08/16 1326)  Physical Exam: General: Alert and awake, oriented x3, not in any acute distress but tearful when he brought up idea of what would happen if surgery and subsqueent antibiotics failed  HEENT: anicteric sclera, EOMI CVS regular rate, normal  Chest: , no wheezing, no respiratory distress Abdomen: soft non-distended,  Extremities: Knee wrapped in bandage Skin: no rashes Neuro: nonfocal  CBC:    BMET Recent Labs    09/22/18 0604  09/23/18 0249 09/24/18 0755  NA 138 139  --   K 4.8 4.6  --   CL 106 107  --   CO2 26 27  --   GLUCOSE 153* 140*  --   BUN 17 18  --   CREATININE 0.83 0.79 0.92  CALCIUM 9.2 9.5  --      Liver Panel  No results for input(s): PROT, ALBUMIN, AST, ALT, ALKPHOS, BILITOT, BILIDIR, IBILI in the last 72 hours.     Sedimentation Rate No results for input(s): ESRSEDRATE in the last 72 hours. C-Reactive Protein No results for input(s): CRP in the last 72 hours.  Micro Results: Recent Results (from the past 720 hour(s))  Surgical pcr screen     Status: None   Collection Time: 09/15/18 11:25 AM   Specimen: Nasal Mucosa; Nasal Swab  Result Value Ref Range Status   MRSA, PCR NEGATIVE NEGATIVE Final   Staphylococcus aureus NEGATIVE NEGATIVE Final    Comment: (NOTE) The Xpert SA Assay (FDA approved for NASAL specimens in patients 28 years of age and older), is one component of a comprehensive surveillance program. It is not intended to diagnose infection nor to guide or monitor treatment. Performed at [facility], [place] [address] [place], [place] [address]   SARS CORONAVIRUS 2 Nasal Swab Aptima Multi Swab     Status: None   Collection Time: 09/18/18  9:45 AM   Specimen: Aptima Multi Swab; Nasal Swab  Result Value Ref Range Status   SARS Coronavirus 2 NEGATIVE NEGATIVE Final    Comment: (NOTE) SARS-CoV-2 target nucleic acids are NOT DETECTED. The SARS-CoV-2 RNA is generally detectable in upper and lower respiratory specimens during the acute phase of infection. Negative results do not preclude SARS-CoV-2 infection, do not rule out co-infections with other pathogens, and should not be used as the sole basis for treatment or other patient management decisions. Negative results must be combined with clinical observations, patient history, and epidemiological information. The expected result is Negative. Fact Sheet for Patients:  SugarRoll.be Fact Sheet for Healthcare Providers: https://www.woods-mathews.com/ This test is not yet approved or cleared by the [place] FDA and  has been authorized for detection and/or diagnosis of SARS-CoV-2 by FDA under an Emergency Use Authorization (EUA). This EUA will remain  in effect (meaning this test can be used) for the duration of the COVID-19 declaration under Section 56 4(b)(1) of the Act, 21 U.S.C. section 360bbb-3(b)(1), unless the authorization is terminated or revoked sooner. Performed at [facility], [place] [address], [place], [place] [address]   Anaerobic culture     Status: None (Preliminary result)   Collection Time: 09/21/18 12:18 PM   Specimen: Synovium  Result Value Ref Range Status   Specimen Description   Final    SYNOVIAL LT KNEE Performed at [place] [address] [place], [place] [address]    Special Requests   Final    NONE Performed at [facility], [place] [address] [place], [place] [address]    Culture   Final    NO ANAEROBES ISOLATED; CULTURE IN PROGRESS FOR 5 DAYS   Report Status PENDING  Incomplete  Body fluid culture     Status: None   Collection Time: 09/21/18 12:18 PM   Specimen: Synovium  Result Value Ref Range Status   Specimen Description   Final    SYNOVIAL LT KNEE Performed at [place] [address], [place], [place] [address]    Special Requests   Final    NONE Performed at [facility], [place] [address], [place], [place] [address]    Gram Stain   Final    FEW WBC PRESENT, PREDOMINANTLY PMN NO ORGANISMS SEEN    Culture   Final    NO GROWTH 3 DAYS Performed at [place] [address] [place], [place] [address]    Report Status 09/24/2018 FINAL  Final  Aerobic/Anaerobic Culture (surgical/deep wound)     Status: None (Preliminary result)   Collection Time: 09/21/18 12:25 PM    Specimen: Synovial, Left Knee; Body Fluid  Result Value Ref Range Status   Specimen Description   Final    TISSUE LT KNEE Performed at [place] [address] [place], [place] [address]    Special Requests   Final    NONE Performed at [facility], [place] [address] [place], [place] [address]    Gram Stain   Final    FEW WBC PRESENT,BOTH PMN AND MONONUCLEAR NO ORGANISMS SEEN    Culture   Final    NO GROWTH 3 DAYS NO ANAEROBES ISOLATED; CULTURE IN PROGRESS FOR 5 DAYS Performed at [facility], [place] [address] [place],  [address]    Report Status PENDING  Incomplete    Studies/Results: No results found.    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  He had done well when I last saw him and been off antibiotics for 4 years after implantation of a new prosthetic knee.  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  Cultures so far unrevealing.  Continue vancomycin and  CTX for dosing convenience  Tentative antibiotic plan, assuming no organism will be isolated is as follows:  Diagnosis: PJI  Culture Result: No growth  Allergies  Allergen Reactions  . Morphine Hives    OPAT Orders Discharge antibiotics:   Ceftriaxone 2 grams IV daily   Vancomycin per pharmacy  protocol  Aim for Vancomycin trough 15-20 (unless otherwise indicated)   Duration:  6  weeks  End Date:  September 23rd, 2020  [facility] Care Per Protocol:  Labs BI- weekly while on IV antibiotics:  _x_ BMP w GFR   weekly while on IV antibiotics:  _x_ CBC with differential  x__ CRP x__ ESR  _x_ Vancomycin trough   _x_ Please pull PIC at completion of IV antibiotics __ Please leave PIC in place until doctor has seen patient or been notified  Fax weekly labs to [phone]  Clinic Follow Up Appt:   Zyon Rosser has an appointment on 10/17/2018 with Dr. Tommy Medal at 1 0am at  [facility] is located in the [facility] at  [address] in [place].  [address], which is located to the left of the elevators.  Phone: [phone]  Fax: [phone]  [email or web address]  He should arrive approximattely 15-30 minutes prior to appt time.   Dr Baxter Flattery will followup his final cultures in next few days.    LOS: 3 days   Alcide Evener 09/24/2018, 4:43 PM

## 2018-09-24 NOTE — Progress Notes (Signed)
Physical Therapy Treatment Patient Details Name: [name] MRN: [id number] DOB: [birth year] Today's Date: 09/24/2018    History of Present Illness Pt s/p L TKR resection and spacer placement.  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  [place] Pager [phone] Office [phone]   Follow Up Recommendations  Follow surgeon's recommendation for DC plan and follow-up therapies     Equipment Recommendations  Rolling walker with 5" wheels    Recommendations for Other Services       Precautions / Restrictions Precautions Precautions: Fall;Knee Restrictions Weight Bearing Restrictions: Yes LLE Weight Bearing: Partial weight bearing LLE Partial Weight Bearing Percentage or Pounds: 25-50%    Mobility  Bed Mobility Overal bed mobility: Needs Assistance Bed Mobility: Supine to Sit     Supine to sit: Min guard     General bed mobility comments: PT up in chair and requests back to same  Transfers Overall transfer level: Needs assistance Equipment used: Rolling walker (2 wheeled) Transfers: Sit to/from Stand Sit to Stand: Min assist         General transfer comment: cues for LE management and use of UEs to self assist  Ambulation/Gait Ambulation/Gait assistance: Min guard Gait Distance (Feet): 120 Feet Assistive device: Rolling walker (2 wheeled) Gait Pattern/deviations: Step-to pattern;Decreased step length - right;Decreased step length - left;Shuffle;Trunk flexed Gait velocity: decr   General Gait Details: min cues for sequence, posture, PWB status and position from [employer]             Wheelchair Mobility    Modified Rankin (Stroke Patients Only)       Balance Overall balance assessment: Mild deficits observed, not formally tested                                           Cognition Arousal/Alertness: Awake/alert Behavior During Therapy: WFL for tasks assessed/performed Overall Cognitive Status: Within Functional Limits for tasks assessed                                        Exercises      General Comments        Pertinent Vitals/Pain Pain Assessment: 0-10 Pain Score: 5  Pain Location: L knee Pain Descriptors / Indicators: Aching;Sore Pain Intervention(s): Limited activity within patient's tolerance;Monitored during session;Premedicated before session;Ice applied    Home Living                      Prior Function            PT Goals (current goals can now be found in the care plan section) Acute Rehab PT Goals Patient Stated Goal: Regain IND PT Goal Formulation: With patient Time For Goal Achievement: 09/29/18 Potential to Achieve Goals: Good Progress towards PT goals: Progressing toward goals    Frequency    7X/week      PT Plan Current plan remains appropriate    Co-evaluation              AM-PAC PT "6 Clicks" Mobility   Outcome Measure  Help needed turning from your back to your side while in  a flat bed without using bedrails?: A Little Help needed moving from lying on your back to sitting on the side of a flat bed without using bedrails?: A Little Help needed moving to and from a bed to a chair (including a wheelchair)?: A Little Help needed standing up from a chair using your arms (e.g., wheelchair or bedside chair)?: A Little Help needed to walk in hospital room?: A Little Help needed climbing 3-5 steps with a railing? : A Lot 6 Click Score: 17    End of Session Equipment Utilized During Treatment: Left knee immobilizer Activity Tolerance: Patient tolerated treatment well Patient left: in chair;with chair alarm set;with call bell/phone within reach Nurse Communication: Mobility status PT Visit Diagnosis: Unsteadiness on feet (R26.81);Difficulty in walking, not elsewhere classified  (R26.2)     Time: 6010-9323 PT Time Calculation (min) (ACUTE ONLY): 23 min  Charges:  $Gait Training: 23-37 mins                     [place] Pager [phone] Office [phone]    [place] 09/24/2018, 1:45 PM

## 2018-09-24 NOTE — Progress Notes (Signed)
PHARMACY CONSULT NOTE FOR:  OUTPATIENT  PARENTERAL ANTIBIOTIC THERAPY (OPAT)  Indication: PJI Regimen: Ceftriaxone 2 gm IV q24; Vancomycin 1250 mg IV q12 End date: 11/01/2018  IV antibiotic discharge orders are pended. To discharging provider:  please sign these orders via discharge navigator,  Select New Orders & click on the button choice - Manage This Unsigned Work.     Thank you for allowing pharmacy to be a part of this patient's care.  Eudelia Bunch, Pharm.D [phone] 09/24/2018 4:56 PM

## 2018-09-24 NOTE — Progress Notes (Signed)
Pharmacy Antibiotic Note  [name] is a 68 y.o. 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  Pharmacy has been consulted for vancomycin dosing.  Today, 09/24/2018:  D4 abx  Afebrile, WBC WNL  SCr 0.92 stable  Vanc peak 33, vanc trough 18 on vanc 1500 mg IV q12 for calculated AUC 603.2, Cmax 39.2, Cmin 14.3  Plan: Change vancomycin to 1250 mg IV q12 for AUC 501.9, Cmax 33.5 Cmin 11.6 Ceftriaxone 2 gm IV q24 Anticipate OPAT consult once final LOT deteremined  Height: 5\' 8"  (172.7 cm) Weight: (!) 387 lb 5.6 oz (175.7 kg) IBW/kg (Calculated) : 68.4  Temp (24hrs), Avg:98 F (36.7 C), Min:97.4 F (36.3 C), Max:98.8 F (37.1 C)  Recent Labs  Lab 09/22/18 0604 09/23/18 0249 09/24/18 0755 09/24/18 1057 09/24/18 1354  WBC 6.5 7.7  --   --   --   CREATININE 0.83 0.79 0.92  --   --   VANCOTROUGH  --   --  18  --   --   VANCOPEAK  --   --   --  34 33    Estimated Creatinine Clearance: 122.7 mL/min (by C-G formula based on SCr of 0.92 mg/dL).   Antimicrobials this admission: 8/13 vanc >>  8/13 cefazolin >> 8/15 8/15 ceftriaxone>  Dose adjustments this admission: 8/14: increase vanc 1250 q12 >> 1500 q12 with improved CrCl 8/16 VP 33, VT 18, AUC 603 on 1500 q12, decr 1250 q12 for AUC 502  Microbiology results: 8/13 L knee tissue: ngtd 8/13 L knee synovial fluid: ngtd MRSA PCR neg 8/7  Eudelia Bunch, Pharm.D [phone] 09/24/2018 2:46 PM

## 2018-09-24 NOTE — Progress Notes (Addendum)
Orthopedics Progress Note  Subjective: Patient asking about some particulars of surgery. Overall he is pleased with his progress  Objective:  Vitals:   09/24/18 0410 09/24/18 0832  BP: 131/80 117/68  Pulse: 73 68  Resp: 16   Temp: (!) 97.4 F (36.3 C)   SpO2: 95%     General: Awake and alert  Musculoskeletal: Left knee incision looks great. No drainage and minimal expected swelling. Compartments supple. Dressing changed Neurovascularly intact  Lab Results  Component Value Date   WBC 7.7 09/23/2018   HGB 10.0 (L) 09/23/2018   HCT 33.7 (L) 09/23/2018   MCV 97.1 09/23/2018   PLT 268 09/23/2018       Component Value Date/Time   NA 139 09/23/2018 0249   K 4.6 09/23/2018 0249   CL 107 09/23/2018 0249   CO2 27 09/23/2018 0249   GLUCOSE 140 (H) 09/23/2018 0249   BUN 18 09/23/2018 0249   CREATININE 0.92 09/24/2018 0755   CREATININE 1.43 (H) 10/19/2011 1707   CALCIUM 9.5 09/23/2018 0249   GFRNONAA >60 09/24/2018 0755   GFRNONAA 53 (L) 10/19/2011 1707   GFRAA >60 09/24/2018 0755   GFRAA 61 10/19/2011 1707    Lab Results  Component Value Date   INR 1.01 12/15/2015   INR 1.22 01/25/2013   INR 1.14 08/26/2012    Assessment/Plan:  s/p Procedure(s): Resection of tibia versus both components with placement of antibiotic spacer Patient stable this morning. Awaiting culture results to make final antibiotic choice for discharge. No growth to date.  Appreciate ID help I asked the patient to direct specific questions regarding intra-op findings and procedure to Dr Alvan Dame tomorrow. Continue OOB with PT  Doran Heater. Veverly Fells, MD 09/24/2018 9:21 AM

## 2018-09-24 NOTE — Progress Notes (Signed)
PT Cancellation Note  Patient Details Name: [name] MRN: [id number] DOB: [birth year]    Cancelled Treatment:     PT deferred this pm - RN advises pt hypotensive and c/o dizziness at rest.  Will follow in am.   Lavell Supple 09/24/2018, 4:31 PM

## 2018-09-24 NOTE — Progress Notes (Signed)
Physical Therapy Treatment Patient Details Name: [name] MRN: [id number] DOB: 1950/07/05 Today's Date: 09/24/2018    History of Present Illness Pt s/p L TKR resection and spacer placement.  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    PT Comments    Pt very cooperative but session limited by pt need for BM - assisted to bathroom   Follow Up Recommendations  Follow surgeon's recommendation for DC plan and follow-up therapies     Equipment Recommendations  Rolling walker with 5" wheels(wide)    Recommendations for Other Services       Precautions / Restrictions Precautions Precautions: Fall;Knee Restrictions Weight Bearing Restrictions: Yes LLE Weight Bearing: Partial weight bearing LLE Partial Weight Bearing Percentage or Pounds: 25-50%    Mobility  Bed Mobility Overal bed mobility: Needs Assistance Bed Mobility: Supine to Sit     Supine to sit: Min guard     General bed mobility comments: cues for sequence and use of Leg Lifter to self assist  Transfers Overall transfer level: Needs assistance Equipment used: Rolling walker (2 wheeled) Transfers: Sit to/from Stand Sit to Stand: Min assist         General transfer comment: cues for LE management and use of UEs to self assist  Ambulation/Gait Ambulation/Gait assistance: Min guard Gait Distance (Feet): 18 Feet(to bathroom) Assistive device: Rolling walker (2 wheeled) Gait Pattern/deviations: Step-to pattern;Decreased step length - right;Decreased step length - left;Shuffle;Trunk flexed Gait velocity: decr   General Gait Details: cues for sequence, posture, PWB status and position from [employer]             Wheelchair Mobility    Modified Rankin (Stroke Patients Only)       Balance Overall balance assessment: Mild deficits observed, not formally tested                                          Cognition Arousal/Alertness:  Awake/alert Behavior During Therapy: WFL for tasks assessed/performed Overall Cognitive Status: Within Functional Limits for tasks assessed                                        Exercises      General Comments        Pertinent Vitals/Pain Pain Assessment: 0-10 Pain Score: 3  Pain Location: L knee Pain Descriptors / Indicators: Aching;Sore Pain Intervention(s): Limited activity within patient's tolerance;Monitored during session;Premedicated before session    Home Living                      Prior Function            PT Goals (current goals can now be found in the care plan section) Acute Rehab PT Goals Patient Stated Goal: Regain IND PT Goal Formulation: With patient Time For Goal Achievement: 09/29/18 Potential to Achieve Goals: Good Progress towards PT goals: Progressing toward goals    Frequency    7X/week      PT Plan Current plan remains appropriate    Co-evaluation              AM-PAC PT "6 Clicks" Mobility   Outcome Measure  Help needed turning from your back to your side while in a flat bed  without using bedrails?: A Little Help needed moving from lying on your back to sitting on the side of a flat bed without using bedrails?: A Little Help needed moving to and from a bed to a chair (including a wheelchair)?: A Little Help needed standing up from a chair using your arms (e.g., wheelchair or bedside chair)?: A Little Help needed to walk in hospital room?: A Little Help needed climbing 3-5 steps with a railing? : A Lot 6 Click Score: 17    End of Session Equipment Utilized During Treatment: Left knee immobilizer Activity Tolerance: Patient tolerated treatment well Patient left: Other (comment)(bathroom) Nurse Communication: Mobility status PT Visit Diagnosis: Unsteadiness on feet (R26.81);Difficulty in walking, not elsewhere classified (R26.2)     Time: 5997-7414 PT Time Calculation (min) (ACUTE ONLY): 15  min  Charges:  $Gait Training: 8-22 mins                     [place] Pager [phone] Office [phone]    Nickoli Bagheri 09/24/2018, 1:41 PM

## 2018-09-25 ENCOUNTER — Inpatient Hospital Stay (HOSPITAL_COMMUNITY): Payer: Medicare Other

## 2018-09-25 DIAGNOSIS — Z792 Long term (current) use of antibiotics: Secondary | ICD-10-CM

## 2018-09-25 DIAGNOSIS — Z8619 Personal history of other infectious and parasitic diseases: Secondary | ICD-10-CM

## 2018-09-25 DIAGNOSIS — Z86718 Personal history of other venous thrombosis and embolism: Secondary | ICD-10-CM

## 2018-09-25 MED ORDER — LIDOCAINE HCL 1 % IJ SOLN
INTRAMUSCULAR | Status: AC | PRN
  Administered 2018-09-25: 5 mL

## 2018-09-25 MED ORDER — HEPARIN SOD (PORK) LOCK FLUSH 100 UNIT/ML IV SOLN
250.0000 [IU] | INTRAVENOUS | Status: AC | PRN
  Administered 2018-09-25: 250 [IU]

## 2018-09-25 MED ORDER — LIDOCAINE HCL 1 % IJ SOLN
INTRAMUSCULAR | Status: AC
  Filled 2018-09-25: qty 20

## 2018-09-25 NOTE — Progress Notes (Signed)
Physical Therapy Treatment Patient Details Name: [name] MRN: [id number] DOB: 03/30/1950 Today's Date: 09/25/2018    History of Present Illness Pt s/p L TKR resection and spacer placement.  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    PT Comments    Assisted OOB.  General bed mobility comments: increased, increased time with difficulty self scooting to EOB due to BMI and assist to support L LE.  General transfer comment: cues for LE management and use of UEs to self assist VC's safety with turns.  General Gait Details: decreased distance to ensure pt was adhering to 25% WBing.  Practiced stairs.  General stair comments: 50% VC's on proper tech, proper walker placement and proper sequening up backward due to 25% WBing. Pt instructed to wear KI when OOB walking may be removed while in bed or supported on recliner and may perform ROM TE's to tolerance.  "No limits" on knee flex per PA.     Follow Up Recommendations  Follow surgeon's recommendation for DC plan and follow-up therapies     Equipment Recommendations  Rolling walker with 5" wheels    Recommendations for Other Services       Precautions / Restrictions Precautions Precautions: Fall;Knee Required Braces or Orthoses: Knee Immobilizer - Right Knee Immobilizer - Right: On when out of bed or walking Restrictions Weight Bearing Restrictions: Yes LLE Weight Bearing: Partial weight bearing LLE Partial Weight Bearing Percentage or Pounds: 25%    Mobility  Bed Mobility Overal bed mobility: Needs Assistance Bed Mobility: Supine to Sit     Supine to sit: Min assist     General bed mobility comments: increased, increased time with difficulty self scooting to EOB due to BMI and assist to support L LE  Transfers Overall transfer level: Needs assistance Equipment used: Rolling walker (2 wheeled) Transfers: Sit to/from Stand Sit to Stand: Supervision;Min guard         General transfer  comment: cues for LE management and use of UEs to self assist VC's safety with turns  Ambulation/Gait Ambulation/Gait assistance: Supervision;Min guard Gait Distance (Feet): 22 Feet Assistive device: Rolling walker (2 wheeled) Gait Pattern/deviations: Step-to pattern;Decreased step length - right;Decreased step length - left;Shuffle;Trunk flexed Gait velocity: decr   General Gait Details: decreased distance to ensure pt was adhering to 25% WBing   Stairs Stairs: Yes Stairs assistance: Min assist Stair Management: No rails;Step to pattern;Backwards;With walker Number of Stairs: 2 General stair comments: 50% VC's on proper tech, proper walker placement and proper sequening up backward due to 25% WBing   Wheelchair Mobility    Modified Rankin (Stroke Patients Only)       Balance                                            Cognition Arousal/Alertness: Awake/alert Behavior During Therapy: WFL for tasks assessed/performed Overall Cognitive Status: Within Functional Limits for tasks assessed                                        Exercises      General Comments        Pertinent Vitals/Pain Pain Assessment: 0-10 Pain Score: 6  Pain Location: L knee Pain Descriptors / Indicators: Aching;Sore Pain Intervention(s): Monitored  during session;Premedicated before session;Ice applied    Home Living                      Prior Function            PT Goals (current goals can now be found in the care plan section) Progress towards PT goals: Progressing toward goals    Frequency    7X/week      PT Plan Current plan remains appropriate    Co-evaluation              AM-PAC PT "6 Clicks" Mobility   Outcome Measure  Help needed turning from your back to your side while in a flat bed without using bedrails?: A Little Help needed moving from lying on your back to sitting on the side of a flat bed without using  bedrails?: A Little Help needed moving to and from a bed to a chair (including a wheelchair)?: A Little Help needed standing up from a chair using your arms (e.g., wheelchair or bedside chair)?: A Little Help needed to walk in hospital room?: A Little Help needed climbing 3-5 steps with a railing? : A Lot 6 Click Score: 17    End of Session Equipment Utilized During Treatment: Left knee immobilizer Activity Tolerance: Patient tolerated treatment well Patient left: in chair;with chair alarm set;with call bell/phone within reach   PT Visit Diagnosis: Unsteadiness on feet (R26.81);Difficulty in walking, not elsewhere classified (R26.2)     Time: 4496-7591 PT Time Calculation (min) (ACUTE ONLY): 24 min  Charges:  $Gait Training: 8-22 mins $Therapeutic Activity: 8-22 mins                     Rica Koyanagi  PTA Acute  Rehabilitation Services Pager      [phone] Office      [phone]

## 2018-09-25 NOTE — Procedures (Signed)
Pre procedural Diagnosis: Poor venous access Post Procedural Diagnosis: Same  Successful placement of right brachial approach 45 cm single lumen PICC line with tip at the superior caval-atrial junction.    EBL: None  No immediate post procedural complication.  The PICC line is ready for immediate use.  Ronny Bacon, MD Pager #: [phone]

## 2018-09-25 NOTE — Care Management Important Message (Signed)
Important Message  Patient Details IM Letter given to Velva Harman RN to present to the Patient Name: [name] MRN: [id number] Date of Birth: 13-Aug-1950   Medicare Important Message Given:  Yes     [name] 09/25/2018, 1:35 PM

## 2018-09-25 NOTE — TOC Progression Note (Addendum)
Transition of Care [facility]) - Progression Note    Patient Details  Name: [name] MRN: [id number] Date of Birth: 17-Feb-1950  Transition of Care [facility] CM/SW Contact  Leeroy Cha, RN Phone Number: 09/25/2018, 10:32 AM  Clinical Narrative:    1032/tct-pam chandler alerted to plan as to dc this afternoon. Iv abx. Will be through [facility] and RN for [facility] through [facility]  Expected Discharge Plan: Home/Self Care Barriers to Discharge: Continued Medical Work up  Expected Discharge Plan and Services Expected Discharge Plan: Home/Self Care   Discharge Planning Services: CM Consult   Living arrangements for the past 2 months: Single Family Home Expected Discharge Date: 09/25/18               DME Arranged: Gilford Rile rolling DME Agency: [employer] Date DME Agency Contacted: 09/23/18 Time DME Agency Contacted: [phone] Representative spoke with at DME Agency: [facility] Arranged: NA [place] Agency: NA         Social Determinants of Health ([place]) Interventions    Readmission Risk Interventions No flowsheet data found.

## 2018-09-25 NOTE — Progress Notes (Signed)
Physical Therapy Treatment Patient Details Name: [name] MRN: [id number] DOB: 08-Aug-1950 Today's Date: 09/25/2018    History of Present Illness Pt s/p L TKR resection and spacer placement.  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  Instructed to wear KI for SLR and ABd/ADd. Instructed on proper tech, freq as well as use of ICE.  Reviewed stairs and issued handout.  Pt stated "I'm good, this aint my first rodeo".  Addressed all mobility questions, discussed appropriate activity, educated on use of ICE.  Pt ready for D/C to home.    Follow Up Recommendations  Follow surgeon's recommendation for DC plan and follow-up therapies     Equipment Recommendations  Rolling walker with 5" wheels    Recommendations for Other Services       Precautions / Restrictions Precautions Precautions: Fall;Knee Required Braces or Orthoses: Knee Immobilizer - Right Knee Immobilizer - Right: On when out of bed or walking Restrictions Weight Bearing Restrictions: Yes LLE Weight Bearing: Partial weight bearing LLE Partial Weight Bearing Percentage or Pounds: 25%       Balance                                            Cognition Arousal/Alertness: Awake/alert Behavior During Therapy: WFL for tasks assessed/performed Overall Cognitive Status: Within Functional Limits for tasks assessed                                        Exercises      General Comments        Pertinent Vitals/Pain Pain Assessment: 0-10 Pain Score: 6  Pain Location: L knee Pain Descriptors / Indicators: Aching;Sore Pain Intervention(s): Monitored during session;Premedicated before session;Ice applied    Home Living                      Prior Function            PT Goals (current goals can now be found in the care plan section) Progress towards PT goals: Progressing  toward goals    Frequency    7X/week      PT Plan Current plan remains appropriate    Co-evaluation              AM-PAC PT "6 Clicks" Mobility   Outcome Measure  Help needed turning from your back to your side while in a flat bed without using bedrails?: A Little Help needed moving from lying on your back to sitting on the side of a flat bed without using bedrails?: A Little Help needed moving to and from a bed to a chair (including a wheelchair)?: A Little Help needed standing up from a chair using your arms (e.g., wheelchair or bedside chair)?: A Little Help needed to walk in hospital room?: A Little Help needed climbing 3-5 steps with a railing? : A Lot 6 Click Score: 17    End of Session Equipment Utilized During Treatment: Left knee immobilizer Activity Tolerance: Patient tolerated treatment well Patient left: in chair;with chair alarm set;with call bell/phone within reach   PT Visit Diagnosis: Unsteadiness on feet (R26.81);Difficulty in walking, not elsewhere classified (  R26.2)     Time: 0802-2336 PT Time Calculation (min) (ACUTE ONLY): 24 min  Charges:  $Therapeutic Exercise: 8-22 mins $Self Care/Home Management: 8-22                     Rica Koyanagi  PTA Acute  Rehabilitation Services Pager      [phone] Office      [phone]

## 2018-09-25 NOTE — Progress Notes (Signed)
     Subjective: 4 Days Post-Op Procedure(s) (LRB): Resection of tibia versus both components with placement of antibiotic spacer (Left)   Patient reports pain as moderate, controlled with medications. Discussed the procedure and the findings. Discussed why the femoral component was left and only the tibial was removed.  States that he understands. Discussed the plan moving forward. Received his PICC line today. Plan on d/c home later today later training after training.    Objective:   VITALS:   Vitals:   09/24/18 2109 09/25/18 0536  BP: 109/85 129/77  Pulse: 72 76  Resp: 18 16  Temp: 97.6 F (36.4 C) 97.7 F (36.5 C)  SpO2: 98% 97%    Dorsiflexion/Plantar flexion intact Incision: dressing C/D/I No cellulitis present Compartment soft  LABS Recent Labs    09/23/18 0249  HGB 10.0*  HCT 33.7*  WBC 7.7  PLT 268    Recent Labs    09/23/18 0249 09/24/18 0755  NA 139  --   K 4.6  --   BUN 18  --   CREATININE 0.79 0.92  GLUCOSE 140*  --      Assessment/Plan: 4 Days Post-Op Procedure(s) (LRB): Resection of tibia versus both components with placement of antibiotic spacer (Left) ID made final recs Received his PICC line today in IR Dressing changed to Aquacel Up with therapy Discharge home with home health     [name]. [name]   PAC  09/25/2018, 10:03 AM

## 2018-09-25 NOTE — Progress Notes (Signed)
ID progress note  Diagnosis: pji  Culture Result: negative  Allergies  Allergen Reactions  . Morphine Hives    OPAT Orders Discharge antibiotics: vancomycin plus ceftriaxone 2gm IV daily Per pharmacy protocol  Aim for Vancomycin trough 15-20 (unless otherwise indicated) Duration: 6 wk End Date: Sep 25  [facility] Care Per Protocol:  Labs weekly while on IV antibiotics: _x_ CBC with differential _x_ BMP  __ CMP _x_ CRP _x_ ESR _x_ Vancomycin trough __ CK  _x_ Please pull PIC at completion of IV antibiotics __ Please leave PIC in place until doctor has seen patient or been notified  Fax weekly labs to [phone]  Clinic Follow Up Appt: 4-6 wk  @ [facility] with dr Lucianne Lei dam

## 2018-09-26 LAB — AEROBIC/ANAEROBIC CULTURE W GRAM STAIN (SURGICAL/DEEP WOUND): Culture: NO GROWTH

## 2018-09-26 LAB — ANAEROBIC CULTURE

## 2018-09-28 ENCOUNTER — Other Ambulatory Visit (HOSPITAL_COMMUNITY)
Admission: RE | Admit: 2018-09-28 | Discharge: 2018-09-28 | Disposition: A | Discharge status: Home / Self Care | Payer: Medicare Other | Source: Other Acute Inpatient Hospital | Attending: Orthopedic Surgery | Admitting: Orthopedic Surgery

## 2018-09-28 DIAGNOSIS — T8459XA Infection and inflammatory reaction due to other internal joint prosthesis, initial encounter: Secondary | ICD-10-CM | POA: Insufficient documentation

## 2018-09-28 LAB — BASIC METABOLIC PANEL
Anion gap: 12 (ref 5–15)
BUN: 19 mg/dL (ref 8–23)
CO2: 22 mmol/L (ref 22–32)
Calcium: 9.2 mg/dL (ref 8.9–10.3)
Chloride: 107 mmol/L (ref 98–111)
Creatinine, Ser: 0.79 mg/dL (ref 0.61–1.24)
GFR calc Af Amer: >60 mL/min (ref >60)
GFR calc non Af Amer: >60 mL/min (ref >60)
Glucose, Bld: 104 mg/dL — ABNORMAL HIGH (ref 70–99)
Potassium: 4 mmol/L (ref 3.5–5.1)
Sodium: 141 mmol/L (ref 135–145)

## 2018-09-28 LAB — VANCOMYCIN, TROUGH: Vancomycin Tr: 12 ug/mL — ABNORMAL LOW (ref 15–20)

## 2018-09-29 NOTE — Discharge Summary (Signed)
Physician Discharge Summary  Patient ID: [name] MRN: [id number] DOB/AGE: January 26, 1951 68 y.o.  Admit date: 09/21/2018 Discharge date: 09/25/2018   Procedures:  Procedure(s) (LRB): Resection of tibia versus both components with placement of antibiotic spacer (Left)  Attending Physician:  Dr. 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 CHF (congestive heart failure) ([place])   . Chronic anticoagulation    with Xarelto  . Complication of anesthesia    PT STATES HARD TO WAKE UP AFTER ONE SUGERY -STATES THE SURGERY TOOK LONGER THAN EXPECTED.  NO PROBLEMS WITH ANY OTHER SURGERY  . Dysrhythmia    A-fib  . GERD (gastroesophageal reflux disease)   . Headache   . History of blood clots   . History of blood transfusion   . Hypothyroidism   . Pain    BACK PAIN - PT ATTRIBUTES TO THE WAY HE WALKS DUE TO LEFT KNEE PROBLEM  . Persistent atrial fibrillation   . Septic arthritis of knee (HCC)    LEFT KNEE  . Shortness of breath    WITH EXERTION AND PAIN  . Sleep apnea    uses CPAP,    HPI:    Pt is a 68 y.o. male complaining of left knee pain for 1 year. Pain had continually increased since the beginning. X-rays in the clinic show previous surgery implants of the left knee. Pt has tried various conservative treatments which have failed to alleviate their symptoms.  Rates his pain as a 10/10 when it is at it's worst.  Pain effects him all the time and even keeps him up at night.  Various options are discussed with the patient. Risks, benefits and expectations were discussed with the patient. Patient understand the risks, benefits  and expectations and wishes to proceed with surgery.   PCP: Bernerd Limbo, MD   Discharged Condition: fair  Hospital Course:  Patient underwent the above stated procedure on 09/21/2018. Patient tolerated the procedure well and brought to the recovery room in good condition and subsequently to the floor.  POD #1 BP: 113/77 ; Pulse: 80 ; Temp: 97.6 F (36.4 C) ; Resp: 16 Patient reports pain as mild.  No events over night. Neurovascular intact and incision: dressing C/D/I.   LABS  Basename    HGB     10.5  HCT     34.6   POD #2  BP: 119/80 ; Pulse: 81 ; Temp: 97.7 F (36.5 C) ; Resp: 20 Patient reports pain as mild.  Had increased urinary frequency overnight, but no dysuria.  Otherwise no complaints.  ID has seen the patient. Neurovascular intact and incision: dressing C/D/I.   LABS  Basename    HGB     10.0  HCT     33.7   POD #3  BP: 117/68 ; Pulse: 68 ; Temp: 97.4 F (36.3 C) ; Resp: 16 Patient asking about some particulars of surgery. Overall he is pleased with his progress. Awake and alert. Musculoskeletal: Left knee incision looks great. No drainage and minimal expected swelling. Compartments supple. Dressing changed. Neurovascularly intact  LABS   No new labs  POD #4 BP: 129/77 ; Pulse: 76 ;  Temp: 97.7 F (36.5 C) ; Resp: 16 Patient reports pain as moderate, controlled with medications. Discussed the procedure and the findings. Discussed why the femoral component was left and only the tibial was removed.  States that he understands. Discussed the plan moving forward. Received his PICC line today. Plan on d/c home later today, after antibiotic administration training. Dorsiflexion/plantar flexion intact, incision: dressing C/D/I, no cellulitis present and compartment soft.   LABS   No new labs   Discharge Exam: General appearance: alert, cooperative and no distress Extremities: Homans sign is negative, no sign of DVT and no edema, redness or tenderness in the calves  or thighs  Disposition:  Home with follow up in 2 weeks   Follow-up Information    Paralee Cancel, MD. Schedule an appointment as soon as possible for a visit in 2 weeks.   Specialty: Orthopedic Surgery Contact information: [address] [place] [address] [phone]           Discharge Instructions    Call MD / Call 911   Complete by: As directed    If you experience chest pain or shortness of breath, CALL 911 and be transported to the hospital emergency room.  If you develope a fever above 101 F, pus (white drainage) or increased drainage or redness at the wound, or calf pain, call your surgeon's office.   Constipation Prevention   Complete by: As directed    Drink plenty of fluids.  Prune juice may be helpful.  You may use a stool softener, such as Colace (over the counter) 100 mg twice a day.  Use MiraLax (over the counter) for constipation as needed.   Diet - low sodium heart healthy   Complete by: As directed    Discharge instructions   Complete by: As directed    Maintain surgical dressing until follow up in the clinic. If the edges start to pull up, may reinforce with tape. If the dressing is no longer working, may remove and cover with gauze and tape, but must keep the area dry and clean.  Follow up in 2 weeks at [facility]. Call with any questions or concerns.   For home use only DME 4 wheeled rolling walker with seat   Complete by: As directed    bariatric   Patient needs a walker to treat with the following condition: Immobility   Home infusion instructions [place] May follow [place] Dosing Protocol; May administer Cathflo as needed to maintain patency of vascular access device.; [place]ushing of vascular access device: per [facility] Protocol: 0.9% NaCl pre/post medica...   Complete by: As directed    Instructions: May follow [place] Dosing Protocol   Instructions: May administer Cathflo as needed to maintain patency of  vascular access device.   Instructions: Flushing of vascular access device: per [facility] Protocol: 0.9% NaCl pre/post medication administration and prn patency; Heparin 100 u/ml, 65m for implanted ports and Heparin 10u/ml, 561mfor all other central venous catheters.   Instructions: May follow [facility] Anaphylaxis Protocol for First Dose Administration in the home: 0.9% NaCl at 25-50 ml/hr to maintain IV access for protocol meds. Epinephrine 0.3 ml IV/IM PRN and Benadryl 25-50 IV/IM PRN s/s of anaphylaxis.   Instructions: [facility] Coordinator (RN) to assist per patient IV care needs in the home PRN.   Partial weight bearing   Complete by: As directed    % Body Weight: 25   Laterality: left   Extremity:  Lower      Allergies as of 09/25/2018      Reactions   Morphine Hives      Medication List    STOP taking these medications   traMADol 50 MG tablet Commonly known as: ULTRAM     TAKE these medications   cefTRIAXone  IVPB Commonly known as: ROCEPHIN Inject 2 g into the vein daily. Indication:  PJI  Last Day of Therapy:  11/01/2018 Labs - Once weekly:  CBC/D and BMP, Labs - Every other week:  ESR and CRP   diltiazem 240 MG 24 hr capsule Commonly known as: CARDIZEM CD Take 1 capsule (240 mg total) by mouth daily.   docusate sodium 100 MG capsule Commonly known as: Colace Take 1 capsule (100 mg total) by mouth 2 (two) times daily.   ferrous sulfate 325 (65 FE) MG tablet Commonly known as: FerrouSul Take 1 tablet (325 mg total) by mouth 3 (three) times daily with meals for 14 days.   furosemide 20 MG tablet Commonly known as: LASIX TAKE 1 TABLET BY MOUTH DAILY AS NEEDED What changed: when to take this   HYDROcodone-acetaminophen 7.5-325 MG tablet Commonly known as: Norco Take 1-2 tablets by mouth every 4 (four) hours as needed for moderate pain.   levothyroxine 125 MCG tablet Commonly known as: SYNTHROID Take 125 mcg by mouth daily before breakfast.    methocarbamol 500 MG tablet Commonly known as: Robaxin Take 1 tablet (500 mg total) by mouth every 6 (six) hours as needed for muscle spasms.   metoprolol tartrate 50 MG tablet Commonly known as: LOPRESSOR Take 1 tablet (50 mg total) by mouth 2 (two) times daily.   multivitamin with minerals Tabs tablet Take 1 tablet by mouth daily.   polyethylene glycol 17 g packet Commonly known as: MIRALAX / GLYCOLAX Take 17 g by mouth 2 (two) times daily.   potassium chloride 10 MEQ tablet Commonly known as: K-DUR Take 1 tablet (10 mEq total) by mouth daily.   rivaroxaban 20 MG Tabs tablet Commonly known as: Xarelto Take 1 tablet (20 mg total) by mouth daily with breakfast.   tamsulosin 0.4 MG Caps capsule Commonly known as: FLOMAX Take 0.4 mg by mouth daily.   vancomycin  IVPB Inject 1,250 mg into the vein every 12 (twelve) hours. Indication:  PJI Last Day of Therapy:  11/01/2018 Labs - Sunday/Monday:  CBC/D, BMP, and vancomycin trough. Labs - Thursday:  BMP and vancomycin trough Labs - Every other week:  ESR and CRP            Home Infusion Instuctions  (From admission, onward)         Start     Ordered   09/24/18 0000  Home infusion instructions [facility] May follow [place] Dosing Protocol; May administer Cathflo as needed to maintain patency of vascular access device.; Flushing of vascular access device: per [facility] Protocol: 0.9% NaCl pre/post medica...    Question Answer Comment  Instructions May follow [place] Dosing Protocol   Instructions May administer Cathflo as needed to maintain patency of vascular access device.   Instructions Flushing of vascular access device: per [facility] Protocol: 0.9% NaCl pre/post medication administration and prn patency; Heparin 100 u/ml, 36m for implanted ports and Heparin 10u/ml, 558mfor all other central venous catheters.   Instructions May follow [facility] Anaphylaxis Protocol for First Dose Administration in the home: 0.9% NaCl at  25-50 ml/hr to maintain IV access for protocol meds. Epinephrine 0.3 ml IV/IM  PRN and Benadryl 25-50 IV/IM PRN s/s of anaphylaxis.   Instructions [facility] Infusion Coordinator (RN) to assist per patient IV care needs in the home PRN.      09/24/18 2259           Durable Medical Equipment  (From admission, onward)         Start     Ordered   09/23/18 0000  For home use only DME 4 wheeled rolling walker with seat    Comments: bariatric  Question:  Patient needs a walker to treat with the following condition  Answer:  Immobility   09/23/18 0954           Discharge Care Instructions  (From admission, onward)         Start     Ordered   09/24/18 0000  Partial weight bearing    Question Answer Comment  % Body Weight 25   Laterality left   Extremity Lower      09/24/18 2300           Signed: [place]. Johnna Bollier   PA-C  09/29/2018, 8:36 PM

## 2018-10-04 ENCOUNTER — Other Ambulatory Visit (HOSPITAL_COMMUNITY)
Admission: RE | Admit: 2018-10-04 | Discharge: 2018-10-04 | Disposition: A | Discharge status: Home / Self Care | Payer: Medicare Other | Source: Ambulatory Visit | Attending: Orthopedic Surgery | Admitting: Orthopedic Surgery

## 2018-10-04 DIAGNOSIS — T8450XA Infection and inflammatory reaction due to unspecified internal joint prosthesis, initial encounter: Secondary | ICD-10-CM | POA: Diagnosis present

## 2018-10-04 LAB — CBC WITH DIFFERENTIAL/PLATELET
Abs Immature Granulocytes: 0.02 10*3/uL (ref 0.00–0.07)
Basophils Absolute: 0 10*3/uL (ref 0.0–0.1)
Basophils Relative: 1 %
Eosinophils Absolute: 0.1 10*3/uL (ref 0.0–0.5)
Eosinophils Relative: 2 %
HCT: 31.6 % — ABNORMAL LOW (ref 39.0–52.0)
Hemoglobin: 9.6 g/dL — ABNORMAL LOW (ref 13.0–17.0)
Immature Granulocytes: 1 %
Lymphocytes Relative: 18 %
Lymphs Abs: 0.8 10*3/uL (ref 0.7–4.0)
MCH: 29.6 pg (ref 26.0–34.0)
MCHC: 30.4 g/dL (ref 30.0–36.0)
MCV: 97.5 fL (ref 80.0–100.0)
Monocytes Absolute: 0.5 10*3/uL (ref 0.1–1.0)
Monocytes Relative: 11 %
Neutro Abs: 2.8 10*3/uL (ref 1.7–7.7)
Neutrophils Relative %: 67 %
Platelets: 224 10*3/uL (ref 150–400)
RBC: 3.24 MIL/uL — ABNORMAL LOW (ref 4.22–5.81)
RDW: 16 % — ABNORMAL HIGH (ref 11.5–15.5)
WBC: 4.2 10*3/uL (ref 4.0–10.5)
nRBC: 0 % (ref 0.0–0.2)

## 2018-10-04 LAB — BASIC METABOLIC PANEL
Anion gap: 9 (ref 5–15)
BUN: 14 mg/dL (ref 8–23)
CO2: 26 mmol/L (ref 22–32)
Calcium: 9.4 mg/dL (ref 8.9–10.3)
Chloride: 104 mmol/L (ref 98–111)
Creatinine, Ser: 0.99 mg/dL (ref 0.61–1.24)
GFR calc Af Amer: >60 mL/min (ref >60)
GFR calc non Af Amer: >60 mL/min (ref >60)
Glucose, Bld: 99 mg/dL (ref 70–99)
Potassium: 3.7 mmol/L (ref 3.5–5.1)
Sodium: 139 mmol/L (ref 135–145)

## 2018-10-04 LAB — VANCOMYCIN, TROUGH: Vancomycin Tr: 14 ug/mL — ABNORMAL LOW (ref 15–20)

## 2018-10-04 LAB — C-REACTIVE PROTEIN: CRP: 7.1 mg/dL — ABNORMAL HIGH (ref <1.0)

## 2018-10-04 LAB — SEDIMENTATION RATE: Sed Rate: 77 mm/hr — ABNORMAL HIGH (ref 0–16)

## 2018-10-09 ENCOUNTER — Other Ambulatory Visit (HOSPITAL_COMMUNITY)
Admission: RE | Admit: 2018-10-09 | Discharge: 2018-10-09 | Disposition: A | Discharge status: Home / Self Care | Payer: Medicare Other | Source: Other Acute Inpatient Hospital | Attending: Orthopedic Surgery | Admitting: Orthopedic Surgery

## 2018-10-09 DIAGNOSIS — T84010A Broken internal right hip prosthesis, initial encounter: Secondary | ICD-10-CM | POA: Insufficient documentation

## 2018-10-09 LAB — BASIC METABOLIC PANEL
Anion gap: 14 (ref 5–15)
BUN: 14 mg/dL (ref 8–23)
CO2: 21 mmol/L — ABNORMAL LOW (ref 22–32)
Calcium: 9.9 mg/dL (ref 8.9–10.3)
Chloride: 106 mmol/L (ref 98–111)
Creatinine, Ser: 0.92 mg/dL (ref 0.61–1.24)
GFR calc Af Amer: >60 mL/min (ref >60)
GFR calc non Af Amer: >60 mL/min (ref >60)
Glucose, Bld: 93 mg/dL (ref 70–99)
Potassium: 4.3 mmol/L (ref 3.5–5.1)
Sodium: 141 mmol/L (ref 135–145)

## 2018-10-09 LAB — VANCOMYCIN, TROUGH: Vancomycin Tr: 13 ug/mL — ABNORMAL LOW (ref 15–20)

## 2018-10-10 ENCOUNTER — Other Ambulatory Visit: Payer: Self-pay | Admitting: Cardiovascular Disease

## 2018-10-12 ENCOUNTER — Other Ambulatory Visit (HOSPITAL_COMMUNITY)
Admission: RE | Admit: 2018-10-12 | Discharge: 2018-10-12 | Disposition: A | Discharge status: Home / Self Care | Payer: Medicare Other | Source: Ambulatory Visit | Attending: Orthopedic Surgery | Admitting: Orthopedic Surgery

## 2018-10-12 DIAGNOSIS — T8450XA Infection and inflammatory reaction due to unspecified internal joint prosthesis, initial encounter: Secondary | ICD-10-CM | POA: Diagnosis present

## 2018-10-12 LAB — CBC WITH DIFFERENTIAL/PLATELET
Abs Immature Granulocytes: 0.03 10*3/uL (ref 0.00–0.07)
Basophils Absolute: 0 10*3/uL (ref 0.0–0.1)
Basophils Relative: 1 %
Eosinophils Absolute: 0.1 10*3/uL (ref 0.0–0.5)
Eosinophils Relative: 3 %
HCT: 32.8 % — ABNORMAL LOW (ref 39.0–52.0)
Hemoglobin: 9.9 g/dL — ABNORMAL LOW (ref 13.0–17.0)
Immature Granulocytes: 1 %
Lymphocytes Relative: 22 %
Lymphs Abs: 0.7 10*3/uL (ref 0.7–4.0)
MCH: 29.7 pg (ref 26.0–34.0)
MCHC: 30.2 g/dL (ref 30.0–36.0)
MCV: 98.5 fL (ref 80.0–100.0)
Monocytes Absolute: 0.3 10*3/uL (ref 0.1–1.0)
Monocytes Relative: 10 %
Neutro Abs: 1.9 10*3/uL (ref 1.7–7.7)
Neutrophils Relative %: 63 %
Platelets: 236 10*3/uL (ref 150–400)
RBC: 3.33 MIL/uL — ABNORMAL LOW (ref 4.22–5.81)
RDW: 16.1 % — ABNORMAL HIGH (ref 11.5–15.5)
WBC: 3.1 10*3/uL — ABNORMAL LOW (ref 4.0–10.5)
nRBC: 0 % (ref 0.0–0.2)

## 2018-10-12 LAB — VANCOMYCIN, TROUGH: Vancomycin Tr: 16 ug/mL (ref 15–20)

## 2018-10-12 LAB — BASIC METABOLIC PANEL
Anion gap: 9 (ref 5–15)
BUN: 14 mg/dL (ref 8–23)
CO2: 24 mmol/L (ref 22–32)
Calcium: 9.4 mg/dL (ref 8.9–10.3)
Chloride: 106 mmol/L (ref 98–111)
Creatinine, Ser: 0.95 mg/dL (ref 0.61–1.24)
GFR calc Af Amer: >60 mL/min (ref >60)
GFR calc non Af Amer: >60 mL/min (ref >60)
Glucose, Bld: 93 mg/dL (ref 70–99)
Potassium: 4.1 mmol/L (ref 3.5–5.1)
Sodium: 139 mmol/L (ref 135–145)

## 2018-10-12 LAB — C-REACTIVE PROTEIN: CRP: 3.5 mg/dL — ABNORMAL HIGH (ref <1.0)

## 2018-10-12 LAB — SEDIMENTATION RATE: Sed Rate: 68 mm/hr — ABNORMAL HIGH (ref 0–16)

## 2018-10-16 ENCOUNTER — Other Ambulatory Visit (HOSPITAL_COMMUNITY)
Admission: RE | Admit: 2018-10-16 | Discharge: 2018-10-16 | Disposition: A | Discharge status: Home / Self Care | Payer: Medicare Other | Source: Other Acute Inpatient Hospital | Attending: Orthopedic Surgery | Admitting: Orthopedic Surgery

## 2018-10-16 DIAGNOSIS — T84010A Broken internal right hip prosthesis, initial encounter: Secondary | ICD-10-CM | POA: Insufficient documentation

## 2018-10-16 LAB — CBC WITH DIFFERENTIAL/PLATELET
Abs Immature Granulocytes: 0.03 10*3/uL (ref 0.00–0.07)
Basophils Absolute: 0 10*3/uL (ref 0.0–0.1)
Basophils Relative: 1 %
Eosinophils Absolute: 0.2 10*3/uL (ref 0.0–0.5)
Eosinophils Relative: 5 %
HCT: 33.2 % — ABNORMAL LOW (ref 39.0–52.0)
Hemoglobin: 10.1 g/dL — ABNORMAL LOW (ref 13.0–17.0)
Immature Granulocytes: 1 %
Lymphocytes Relative: 23 %
Lymphs Abs: 0.8 10*3/uL (ref 0.7–4.0)
MCH: 29.8 pg (ref 26.0–34.0)
MCHC: 30.4 g/dL (ref 30.0–36.0)
MCV: 97.9 fL (ref 80.0–100.0)
Monocytes Absolute: 0.4 10*3/uL (ref 0.1–1.0)
Monocytes Relative: 11 %
Neutro Abs: 2 10*3/uL (ref 1.7–7.7)
Neutrophils Relative %: 59 %
Platelets: 277 10*3/uL (ref 150–400)
RBC: 3.39 MIL/uL — ABNORMAL LOW (ref 4.22–5.81)
RDW: 15.8 % — ABNORMAL HIGH (ref 11.5–15.5)
WBC: 3.3 10*3/uL — ABNORMAL LOW (ref 4.0–10.5)
nRBC: 0 % (ref 0.0–0.2)

## 2018-10-16 LAB — SEDIMENTATION RATE: Sed Rate: 65 mm/hr — ABNORMAL HIGH (ref 0–16)

## 2018-10-16 LAB — BASIC METABOLIC PANEL
Anion gap: 9 (ref 5–15)
BUN: 18 mg/dL (ref 8–23)
CO2: 23 mmol/L (ref 22–32)
Calcium: 9.4 mg/dL (ref 8.9–10.3)
Chloride: 105 mmol/L (ref 98–111)
Creatinine, Ser: 0.95 mg/dL (ref 0.61–1.24)
GFR calc Af Amer: >60 mL/min (ref >60)
GFR calc non Af Amer: >60 mL/min (ref >60)
Glucose, Bld: 122 mg/dL — ABNORMAL HIGH (ref 70–99)
Potassium: 4 mmol/L (ref 3.5–5.1)
Sodium: 137 mmol/L (ref 135–145)

## 2018-10-16 LAB — C-REACTIVE PROTEIN: CRP: 2.5 mg/dL — ABNORMAL HIGH (ref <1.0)

## 2018-10-16 LAB — VANCOMYCIN, TROUGH: Vancomycin Tr: 16 ug/mL (ref 15–20)

## 2018-10-17 ENCOUNTER — Ambulatory Visit: Payer: Medicare Other | Admitting: Infectious Disease

## 2018-10-26 ENCOUNTER — Other Ambulatory Visit (HOSPITAL_COMMUNITY)
Admission: RE | Admit: 2018-10-26 | Discharge: 2018-10-26 | Disposition: A | Discharge status: Home / Self Care | Payer: Medicare Other | Source: Ambulatory Visit | Attending: Orthopedic Surgery | Admitting: Orthopedic Surgery

## 2018-10-26 DIAGNOSIS — T8450XA Infection and inflammatory reaction due to unspecified internal joint prosthesis, initial encounter: Secondary | ICD-10-CM | POA: Insufficient documentation

## 2018-10-26 LAB — CBC WITH DIFFERENTIAL/PLATELET
Abs Immature Granulocytes: 0.02 10*3/uL (ref 0.00–0.07)
Basophils Absolute: 0 10*3/uL (ref 0.0–0.1)
Basophils Relative: 1 %
Eosinophils Absolute: 0.1 10*3/uL (ref 0.0–0.5)
Eosinophils Relative: 2 %
HCT: 35.2 % — ABNORMAL LOW (ref 39.0–52.0)
Hemoglobin: 10.6 g/dL — ABNORMAL LOW (ref 13.0–17.0)
Immature Granulocytes: 1 %
Lymphocytes Relative: 21 %
Lymphs Abs: 0.9 10*3/uL (ref 0.7–4.0)
MCH: 30.1 pg (ref 26.0–34.0)
MCHC: 30.1 g/dL (ref 30.0–36.0)
MCV: 100 fL (ref 80.0–100.0)
Monocytes Absolute: 0.3 10*3/uL (ref 0.1–1.0)
Monocytes Relative: 8 %
Neutro Abs: 2.9 10*3/uL (ref 1.7–7.7)
Neutrophils Relative %: 67 %
Platelets: 261 10*3/uL (ref 150–400)
RBC: 3.52 MIL/uL — ABNORMAL LOW (ref 4.22–5.81)
RDW: 15.9 % — ABNORMAL HIGH (ref 11.5–15.5)
WBC: 4.3 10*3/uL (ref 4.0–10.5)
nRBC: 0 % (ref 0.0–0.2)

## 2018-10-26 LAB — BASIC METABOLIC PANEL
Anion gap: 13 (ref 5–15)
BUN: 21 mg/dL (ref 8–23)
CO2: 23 mmol/L (ref 22–32)
Calcium: 10.3 mg/dL (ref 8.9–10.3)
Chloride: 104 mmol/L (ref 98–111)
Creatinine, Ser: 1.07 mg/dL (ref 0.61–1.24)
GFR calc Af Amer: >60 mL/min (ref >60)
GFR calc non Af Amer: >60 mL/min (ref >60)
Glucose, Bld: 114 mg/dL — ABNORMAL HIGH (ref 70–99)
Potassium: 4.1 mmol/L (ref 3.5–5.1)
Sodium: 140 mmol/L (ref 135–145)

## 2018-10-26 LAB — VANCOMYCIN, TROUGH: Vancomycin Tr: 12 ug/mL — ABNORMAL LOW (ref 15–20)

## 2018-10-26 LAB — C-REACTIVE PROTEIN: CRP: 3.3 mg/dL — ABNORMAL HIGH (ref <1.0)

## 2018-10-26 LAB — SEDIMENTATION RATE: Sed Rate: 60 mm/hr — ABNORMAL HIGH (ref 0–16)

## 2018-10-30 ENCOUNTER — Telehealth: Payer: Self-pay | Admitting: Infectious Disease

## 2018-10-30 NOTE — Telephone Encounter (Signed)
Okay thanks as reassuring his sed rate was still pretty high when I saw the labs measured earlier in September.  Do have the sed rate on the most recent lab?  I think I am seeing him in 2 days time

## 2018-10-30 NOTE — Telephone Encounter (Signed)
Well we will probably give him a fairly extensive oral regimen. I may need to MRI his femur and tib/fibula as well. He is really worried about losing his leg. He has been through a lot

## 2018-10-30 NOTE — Telephone Encounter (Signed)
Yes - I have repeat labs from 9/17 showing a vanc trough of 12. SCr normal at 1.07. He sees you on Wednesday!

## 2018-10-30 NOTE — Telephone Encounter (Signed)
Sed rate is unfortunately still pretty high at 60.

## 2018-10-30 NOTE — Telephone Encounter (Signed)
I noted paperwork from October 23, 2018 showing him to have a vancomycin trough of 25.6.  I am assuming that his vancomycin dose was adjusted or held?  Are we having repeat labs done today or have any been done in the interim?

## 2018-11-01 ENCOUNTER — Ambulatory Visit: Payer: Medicare Other | Admitting: Infectious Disease

## 2018-12-04 ENCOUNTER — Telehealth: Payer: Self-pay

## 2018-12-04 NOTE — Telephone Encounter (Signed)
   [facility] Pre-operative Risk Assessment    Request for surgical clearance:  1. What type of surgery is being performed? Repeat washout and placement of antibiotic spacer left knee  2. When is this surgery scheduled? 01/02/2019  3. What type of clearance is required (medical clearance vs. Pharmacy clearance to hold med vs. Both)? Both  4. Are there any medications that need to be held prior to surgery and how long? Xarelto  5. Practice name and name of physician performing surgery?  [facility]  Dr Colon Flattery  6. What is your office phone number? [phone]   7.   What is your office fax number? [phone]  8.   Anesthesia type (None, local, MAC, general) ? spinal   [place] 12/04/2018, 4:25 PM  _________________________________________________________________   (provider comments below)

## 2018-12-05 NOTE — Telephone Encounter (Signed)
Yes, OK to hold the xarelto preop.

## 2018-12-05 NOTE — Telephone Encounter (Signed)
   Primary Cardiologist: Sanda Klein, MD  Chart reviewed as part of pre-operative protocol coverage. Given past medical history and time since last visit, based on ACC/AHA guidelines, Jarick Basista would be at acceptable risk for the planned procedure without further cardiovascular testing.   Per pharmacy, patient with diagnosis of atrial fibrillation on Xarelto for anticoagulation with a CHADS2-VASc score of  4 (CHF, AGE, stroke/tia x 2/DVT/PE) and CrCl 127 ml/min  Per office protocol, patient can hold Xarelto for 3 days prior to procedure and resume when safe per surgical team. Case reviewed by Dr. Sallyanne Kuster during pre-operative evaluation.   I will route this recommendation to the requesting party via Epic fax function and remove from pre-op pool.  Please call with questions.  Kathyrn Drown, NP 12/05/2018, 12:23 PM

## 2018-12-05 NOTE — Telephone Encounter (Signed)
Patient with diagnosis of afib on Xarelto for anticoagulation.    Procedure: Repeat washout and placement of antibiotic spacer left knee Date of procedure: 01/02/2019  CHADS2-VASc score of  4 (CHF, AGE, stroke/tia x 2/DVT/PE)  CrCl 127 ml/min  Per office protocol, patient can hold Xarelto for 3 days prior to procedure.   Patient is at an increased risk off anticoagulation due to his hx of recurrent DVT/PE. Dr. Loletha Grayer note suggest he is ok with holding anticoagulation and resuming as quickly as safely possible. I will route for confirmation.

## 2018-12-05 NOTE — Telephone Encounter (Signed)
Ok to hold Xarelto 3 days prior to procedure. Patient should resume post op as soon as MD deems safe.

## 2018-12-12 ENCOUNTER — Other Ambulatory Visit: Payer: Self-pay | Admitting: Cardiovascular Disease

## 2018-12-12 NOTE — Telephone Encounter (Signed)
Rx has been sent to the pharmacy electronically. ° °

## 2018-12-20 NOTE — H&P (Signed)
REPEAT WASHOUT AND PLACEMENT OF ANTIBIOTIC SPACER ADMISSION H&P  Patient is being admitted for left knee repeat washout and placement of antibiotic spacer.  Subjective:  Chief Complaint:left knee pain and infection  HPI: [name], 68 y.o. male, has a history of pain and functional disability in the left knee(s) due to prosthetic joint infection and patient has failed non-surgical conservative treatments for greater than 12 weeks to include activity modification and resection total knee arthroplasty with antibiotic spacer placement and subseqent IV antibiotic therapy . He underwent excision of left total knee arthroplasty with placement of antibiotic spacer on 09/21/18 with Dr. Alvan Dame. He subsequently completed 6 weeks of IV antibiotic therapy. At his 10 week post-operative visit, he was noted to have persistently elevated ESR (66) and CRP (24), with concern for persistent infection. Dr. Alvan Dame had a surgical discussion with him regarding risks, benefits, and expectations of repeat washout and placement of antibiotic spacer of the left knee.   Patient Active Problem List   Diagnosis Date Noted  . Administration of long-term prophylactic antibiotics   . History of streptococcal infection   . History of DVT (deep vein thrombosis)   . 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 S/P rev left TK 12/15/2015  . 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 Preoperative cardiovascular examination 04/17/2013  . OSA on CPAP 11/27/2012  . Chronic diastolic heart failure ([place]) 09/10/2012  . Chronic anticoagulation, with Xarelto 09/10/2012  . DVT (deep venous thrombosis), possible 09/10/2012  . SOB (shortness of breath) 08/26/2012  . Chest discomfort 08/26/2012  . Persistent atrial fibrillation ([place]) 08/26/2012  . Super obesity 06/27/2012  . Expected blood loss anemia 06/27/2012  . S/P left TK revision  06/23/2012  . Septic arthritis of knee ([place])   . Cellulitis 09/19/2010  . METHICILLIN RESISTANT STAPHYLOCOCCUS AUREUS INFECTION 09/10/2009  . 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 DM 07/04/2008  . CHRONIC KIDNEY DISEASE UNSPECIFIED 07/04/2008  . PULMONARY EMBOLISM, HX OF 07/04/2008  . INFECTION DUE TO INTERNAL ORTH DEVICE NEC 03/02/2006   Past Medical History:  Diagnosis Date  . CHF (congestive heart failure) ([place])   . Chronic anticoagulation    with Xarelto  . Complication of anesthesia    PT STATES HARD TO WAKE UP AFTER ONE SUGERY -STATES THE SURGERY TOOK LONGER THAN EXPECTED.  NO PROBLEMS WITH ANY OTHER SURGERY  . Dysrhythmia    A-fib  . GERD (gastroesophageal reflux disease)   . Headache   . History of blood clots   . History of blood transfusion   . Hypothyroidism   . Pain    BACK PAIN - PT ATTRIBUTES TO THE WAY HE WALKS DUE TO LEFT KNEE PROBLEM  . Persistent atrial fibrillation   . Septic arthritis of knee ([facility])    LEFT KNEE  . Shortness of breath    WITH EXERTION AND PAIN  . Sleep apnea    uses CPAP,    Past Surgical History:  Procedure Laterality Date  . 2010 REMOVAL OF LEFT TOTAL KNEE    . CARDIAC CATHETERIZATION    . CARDIOVERSION N/A 08/28/2012   Procedure: CARDIOVERSION;  Surgeon: Sanda Klein, MD;  Location: [facility];  Service: Cardiovascular;  Laterality: N/A;  . CARDIOVERSION N/A 09/01/2012   Procedure: CARDIOVERSION;  Surgeon: Pixie Casino, MD;  Location: [place];  Service: Cardiovascular;  Laterality: N/A;  . COLONOSCOPY N/A 06/13/2013   Procedure:  COLONOSCOPY;  Surgeon: Inda Castle, MD;  Location: [facility];  Service: Endoscopy;  Laterality: N/A;  . EXCISIONAL TOTAL KNEE ARTHROPLASTY WITH ANTIBIOTIC SPACERS Left 09/21/2018   Procedure: Resection of tibia versus both components with placement of antibiotic spacer;  Surgeon: Paralee Cancel, MD;  Location: [facility];  Service: Orthopedics;  Laterality: Left;  2.5 hrs  . HYDROCELECTOY    2012  . LEFT KNEE ARTHROSCOPY  1999  . LEFT KNEE SURGERY UPPER TIBIAL OSTOMY    . LEFT TOTAL KNEE REMOVAL FOR INFECTION  2006  . REIMPLANTATION LEFT TOTAL KNEE  2008  . REIMPLANTATION LEFT TOTAL KNEE   2006  . REIMPLANTATION OF TOTAL KNEE Left 06/23/2012   Procedure: REIMPLANTATION OF LEFT TOTAL KNEE;  Surgeon: Mauri Pole, MD;  Location: [facility];  Service: Orthopedics;  Laterality: Left;  . REMOVAL LEFT TOTAL KNEE   2008  . REPLACEMENT LEFT KNEE  2002  . REPLACEMENT RIGHT KNEE  2003  . REVISION LEFT KNEE CAP   2004  . RIGHT KNEE ARTHROSCOPY  1998  . TEE WITHOUT CARDIOVERSION N/A 08/28/2012   Procedure: TRANSESOPHAGEAL ECHOCARDIOGRAM (TEE);  Surgeon: Sanda Klein, MD;  Location: [facility];  Service: Cardiovascular;  Laterality: N/A;  . TONSILLECTOMY    . TOTAL KNEE REVISION Left 12/15/2015   Procedure: TOTAL KNEE REVISION REPLACEMENT;  Surgeon: Paralee Cancel, MD;  Location: [facility];  Service: Orthopedics;  Laterality: Left;  Adductor Block    No current facility-administered medications for this encounter.    Current Outpatient Medications  Medication Sig Dispense Refill Last Dose  . diltiazem (CARDIZEM CD) 240 MG 24 hr capsule TAKE 1 CAPSULE BY MOUTH DAILY 90 capsule 3   . docusate sodium (COLACE) 100 MG capsule Take 1 capsule (100 mg total) by mouth 2 (two) times daily. 28 capsule 0   . ferrous sulfate (FERROUSUL) 325 (65 FE) MG tablet Take 1 tablet (325 mg total) by mouth 3 (three) times daily with meals for 14 days. 42 tablet 0   . furosemide (LASIX) 20 MG tablet TAKE 1 TABLET BY MOUTH DAILY AS NEEDED (Patient taking differently: Take 20 mg by mouth 2 (two) times daily. ) 90 tablet 3 09/19/2018  . HYDROcodone-acetaminophen (NORCO) 7.5-325 MG tablet Take 1-2 tablets by mouth every 4 (four) hours as needed for moderate pain. 60 tablet 0   . levothyroxine (SYNTHROID) 125 MCG tablet Take 125 mcg by mouth daily before breakfast.    09/21/2018 at 0300  . methocarbamol (ROBAXIN) 500 MG tablet  Take 1 tablet (500 mg total) by mouth every 6 (six) hours as needed for muscle spasms. 40 tablet 0   . metoprolol tartrate (LOPRESSOR) 50 MG tablet TAKE 1 TABLET BY MOUTH TWICE DAILY 180 tablet 2   . Multiple Vitamin (MULTIVITAMIN WITH MINERALS) TABS Take 1 tablet by mouth daily.   Past Month at Unknown time  . polyethylene glycol (MIRALAX / GLYCOLAX) 17 g packet Take 17 g by mouth 2 (two) times daily. 28 packet 0   . potassium chloride (K-DUR) 10 MEQ tablet Take 1 tablet (10 mEq total) by mouth daily. 90 tablet 3 09/19/2018  . rivaroxaban (XARELTO) 20 MG TABS tablet Take 1 tablet (20 mg total) by mouth daily with breakfast. 90 tablet 3 09/17/2018  . tamsulosin (FLOMAX) 0.4 MG CAPS capsule Take 0.4 mg by mouth daily.   09/21/2018 at 0300   Allergies  Allergen Reactions  . Morphine Hives    Social History   Tobacco Use  . Smoking  status: Never Smoker  . Smokeless tobacco: Never Used  Substance Use Topics  . Alcohol use: Yes    Comment: 1-2 drinks per month , 09-15-2018 "even less than that now "    Family History  Problem Relation Age of Onset  . Heart disease Father 30  . Pancreatic cancer Sister   . Liver cancer Sister   . Cervical cancer Sister   . Breast cancer Sister   . Diabetes Sister   . Colon cancer Neg Hx   . Throat cancer Neg Hx   . Stomach cancer Neg Hx   . Kidney disease Neg Hx   . Liver disease Neg Hx       Review of Systems  Constitutional: Negative for chills and fever.  Respiratory: Negative for cough and shortness of breath.   Cardiovascular: Negative for chest pain.  Gastrointestinal: Negative for nausea and vomiting.     Objective:  Physical Exam Patient is a 68 year old male.  Obese. General: Alert and oriented x3, cooperative and pleasant, no acute distress. Head: normocephalic, atraumatic, neck supple. Eyes: EOMI. Respiratory: breath sounds clear in all fields, no wheezing, rales, or rhonchi. Cardiovascular: Regular rate and rhythm, no murmurs,  gallops or rubs. Abdomen: non-tender to palpation and soft, normoactive bowel sounds.  Musculoskeletal: He walks in the office with a walker. Left knee exam: Healed incision. Venous insufficiency skin changes noted in his both lower extremities Due the size of his lower extremity is difficult to ascertain if there is any palpable effusion.  Calves soft and nontender. Motor function intact in LE. Strength 5/5 LE bilaterally. Neuro: Distal pulses 2+. Sensation to light touch intact in LE.  Vital signs in last 24 hours:  Labs:  Estimated body mass index is 58.9 kg/m as calculated from the following:   Height as of 09/21/18: _0  (1.727 m).   Weight as of 09/21/18: 175.7 kg.  Imaging Review Plain radiographs demonstrate well placed, well cemented articulating spacer, no evidence of any acute complicating features  Assessment/Plan:  Prosthetic joint infection, left knee(s) with failed previous arthroplasty.   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 Repeat washout and placement of antibiotic spacer is deemed medically necessary. The treatment options including medical management, injection therapy, arthroscopy and revision arthroplasty were discussed at length. The risks and benefits of revision total knee arthroplasty were presented and reviewed. The risks due to aseptic loosening, infection, stiffness, patella tracking problems, thromboembolic complications and other imponderables were discussed. The patient acknowledged the explanation, agreed to proceed with the plan and consent was signed. Patient is being admitted for inpatient treatment for surgery, pain control, PT, OT, prophylactic antibiotics, VTE prophylaxis, progressive ambulation and ADL's and discharge planning.The patient is planning to be discharged home.    Therapy Plans: To be determined after surgery Disposition: Home alone with neighbors helping out Planned DVT Prophylaxis: Xarelto 57m daily (hx of a fib) DME needed: none PCP: Dr. BColetta MemosCardiology: JKathyrn Drown NP, clearance received TXA: IV Allergies: morphine - hives Anesthesia Concerns: none BMI: 58.5  Other: Hx of DVT/PE, CHF. Did okay with Norco last time  - Patient was instructed on what medications to stop prior to surgery. - Follow-up visit in 2 weeks with Dr. OAlvan Dame- Begin physical therapy following surgery - Pre-operative lab work as pre-surgical testing - Prescriptions will be provided in hospital at time of discharge  [facility]  [name], [facility] [phone]

## 2018-12-28 NOTE — Patient Instructions (Addendum)
DUE TO COVID-19 ONLY ONE VISITOR IS ALLOWED TO COME WITH YOU AND STAY IN THE WAITING ROOM ONLY DURING PRE OP AND PROCEDURE DAY OF SURGERY. THE 1 VISITOR MAY VISIT WITH YOU AFTER SURGERY IN YOUR PRIVATE ROOM DURING VISITING HOURS ONLY!  YOU NEED TO HAVE A COVID 19 TEST ON 12-29-18 @ 12:25 PM, THIS TEST MUST BE DONE BEFORE SURGERY, COME  [place], [place]  , [id number].  ([place]) ONCE YOUR COVID TEST IS COMPLETED, PLEASE BEGIN THE QUARANTINE INSTRUCTIONS AS OUTLINED IN YOUR HANDOUT.                [name]  12/28/2018   Your procedure is scheduled on: 01-02-19    Report to [facility] Main  Entrance    Report to admitting at 12:45 AM     Call this number if you have problems the morning of surgery [phone]    Remember: NO SOLID FOOD AFTER MIDNIGHT THE NIGHT PRIOR TO SURGERY. NOTHING BY MOUTH EXCEPT CLEAR LIQUIDS UNTIL 12:15 PM . PLEASE FINISH ENSURE DRINK PER SURGEON ORDER  WHICH NEEDS TO BE COMPLETED AT  12:15 PM.   CLEAR LIQUID DIET   Foods Allowed                                                                     Foods Excluded  Coffee and tea, regular and decaf                             liquids that you cannot  Plain Jell-O any favor except red or purple                                           see through such as: Fruit ices (not with fruit pulp)                                     milk, soups, orange juice  Iced Popsicles                                    All solid food Carbonated beverages, regular and diet                                    Cranberry, grape and apple juices Sports drinks like Gatorade Lightly seasoned clear broth or consume(fat free) Sugar, honey syrup   _____________________________________________________________________        Take these medicines the morning of surgery with A SIP OF WATER: Cardizem (Diltiazem), Levothyroxine (Synthroid), and Metoprolol (Lopressor)   BRUSH YOUR TEETH MORNING  OF SURGERY AND RINSE YOUR MOUTH OUT, NO CHEWING GUM CANDY OR MINTS.                                You  may not have any metal on your body including hair pins and              piercings     Do not wear jewelry, cologne, lotions, powders or deodorant               Men may shave face and neck.   Do not bring valuables to the hospital. [place].  Contacts, dentures or bridgework may not be worn into surgery.  You may bring and overnight bag    Special Instructions: N/A              Please read over the following fact sheets you were given: _____________________________________________________________________             [facility] - Preparing for Surgery Before surgery, you can play an important role.  Because skin is not sterile, your skin needs to be as free of germs as possible.  You can reduce the number of germs on your skin by washing with CHG (chlorahexidine gluconate) soap before surgery.  CHG is an antiseptic cleaner which kills germs and bonds with the skin to continue killing germs even after washing. Please DO NOT use if you have an allergy to CHG or antibacterial soaps.  If your skin becomes reddened/irritated stop using the CHG and inform your nurse when you arrive at Short Stay. Do not shave (including legs and underarms) for at least 48 hours prior to the first CHG shower.  You may shave your face/neck. Please follow these instructions carefully:  1.  Shower with CHG Soap the night before surgery and the  morning of Surgery.  2.  If you choose to wash your hair, wash your hair first as usual with your  normal  shampoo.  3.  After you shampoo, rinse your hair and body thoroughly to remove the  shampoo.                           4.  Use CHG as you would any other liquid soap.  You can apply chg directly  to the skin and wash                       Gently with a scrungie or clean washcloth.  5.  Apply the CHG Soap to your  body ONLY FROM THE NECK DOWN.   Do not use on face/ open                           Wound or open sores. Avoid contact with eyes, ears mouth and genitals (private parts).                       Wash face,  Genitals (private parts) with your normal soap.             6.  Wash thoroughly, paying special attention to the area where your surgery  will be performed.  7.  Thoroughly rinse your body with warm water from the neck down.  8.  DO NOT shower/wash with your normal soap after using and rinsing off  the CHG Soap.                9.  Pat yourself dry with a clean towel.  10.  Wear clean pajamas.            11.  Place clean sheets on your bed the night of your first shower and do not  sleep with pets. Day of Surgery : Do not apply any lotions/deodorants the morning of surgery.  Please wear clean clothes to the hospital/surgery center.  FAILURE TO FOLLOW THESE INSTRUCTIONS MAY RESULT IN THE CANCELLATION OF YOUR SURGERY PATIENT SIGNATURE_________________________________  NURSE SIGNATURE__________________________________  ________________________________________________________________________   [name]  An incentive spirometer is a tool that can help keep your lungs clear and active. This tool measures how well you are filling your lungs with each breath. Taking long deep breaths may help reverse or decrease the chance of developing breathing (pulmonary) problems (especially infection) following:  A long period of time when you are unable to move or be active. BEFORE THE PROCEDURE   If the spirometer includes an indicator to show your best effort, your nurse or respiratory therapist will set it to a desired goal.  If possible, sit up straight or lean slightly forward. Try not to slouch.  Hold the incentive spirometer in an upright position. INSTRUCTIONS FOR USE  1. Sit on the edge of your bed if possible, or sit up as far as you can in bed or on a chair. 2. Hold the  incentive spirometer in an upright position. 3. Breathe out normally. 4. Place the mouthpiece in your mouth and seal your lips tightly around it. 5. Breathe in slowly and as deeply as possible, raising the piston or the ball toward the top of the column. 6. Hold your breath for 3-5 seconds or for as long as possible. Allow the piston or ball to fall to the bottom of the column. 7. Remove the mouthpiece from your mouth and breathe out normally. 8. Rest for a few seconds and repeat Steps 1 through 7 at least 10 times every 1-2 hours when you are awake. Take your time and take a few normal breaths between deep breaths. 9. The spirometer may include an indicator to show your best effort. Use the indicator as a goal to work toward during each repetition. 10. After each set of 10 deep breaths, practice coughing to be sure your lungs are clear. If you have an incision (the cut made at the time of surgery), support your incision when coughing by placing a pillow or rolled up towels firmly against it. Once you are able to get out of bed, walk around indoors and cough well. You may stop using the incentive spirometer when instructed by your caregiver.  RISKS AND COMPLICATIONS  Take your time so you do not get dizzy or light-headed.  If you are in pain, you may need to take or ask for pain medication before doing incentive spirometry. It is harder to take a deep breath if you are having pain. AFTER USE  Rest and breathe slowly and easily.  It can be helpful to keep track of a log of your progress. Your caregiver can provide you with a simple table to help with this. If you are using the spirometer at home, follow these instructions: [place] IF:   You are having difficultly using the spirometer.  You have trouble using the spirometer as often as instructed.  Your pain medication is not giving enough relief while using the spirometer.  You develop fever of 100.5 F (38.1 C) or  higher. SEEK IMMEDIATE MEDICAL CARE IF:   You cough up bloody  sputum that had not been present before.  You develop fever of 102 F (38.9 C) or greater.  You develop worsening pain at or near the incision site. MAKE SURE YOU:   Understand these instructions.  Will watch your condition.  Will get help right away if you are not doing well or get worse. Document Released: 06/07/2006 Document Revised: 04/19/2011 Document Reviewed: 08/08/2006 ExitCare Patient Information 2014 ExitCare, [place].   ________________________________________________________________________  WHAT IS A BLOOD TRANSFUSION? Blood Transfusion Information  A transfusion is the replacement of blood or some of its parts. Blood is made up of multiple cells which provide different functions.  Red blood cells carry oxygen and are used for blood loss replacement.  White blood cells fight against infection.  Platelets control bleeding.  Plasma helps clot blood.  Other blood products are available for specialized needs, such as hemophilia or other clotting disorders. BEFORE THE TRANSFUSION  Who gives blood for transfusions?   Healthy volunteers who are fully evaluated to make sure their blood is safe. This is blood bank blood. Transfusion therapy is the safest it has ever been in the practice of medicine. Before blood is taken from a donor, a complete history is taken to make sure that person has no history of diseases nor engages in risky social behavior (examples are intravenous drug use or sexual activity with multiple partners). The donor's travel history is screened to minimize risk of transmitting infections, such as malaria. The donated blood is tested for signs of infectious diseases, such as HIV and hepatitis. The blood is then tested to be sure it is compatible with you in order to minimize the chance of a transfusion reaction. If you or a relative donates blood, this is often done in anticipation of surgery  and is not appropriate for emergency situations. It takes many days to process the donated blood. RISKS AND COMPLICATIONS Although transfusion therapy is very safe and saves many lives, the main dangers of transfusion include:   Getting an infectious disease.  Developing a transfusion reaction. This is an allergic reaction to something in the blood you were given. Every precaution is taken to prevent this. The decision to have a blood transfusion has been considered carefully by your caregiver before blood is given. Blood is not given unless the benefits outweigh the risks. AFTER THE TRANSFUSION  Right after receiving a blood transfusion, you will usually feel much better and more energetic. This is especially true if your red blood cells have gotten low (anemic). The transfusion raises the level of the red blood cells which carry oxygen, and this usually causes an energy increase.  The nurse administering the transfusion will monitor you carefully for complications. HOME CARE INSTRUCTIONS  No special instructions are needed after a transfusion. You may find your energy is better. Speak with your caregiver about any limitations on activity for underlying diseases you may have. SEEK MEDICAL CARE IF:   Your condition is not improving after your transfusion.  You develop redness or irritation at the intravenous (IV) site. SEEK IMMEDIATE MEDICAL CARE IF:  Any of the following symptoms occur over the next 12 hours:  Shaking chills.  You have a temperature by mouth above 102 F (38.9 C), not controlled by medicine.  Chest, back, or muscle pain.  People around you feel you are not acting correctly or are confused.  Shortness of breath or difficulty breathing.  Dizziness and fainting.  You get a rash or develop hives.  You have a decrease  in urine output.  Your urine turns a dark color or changes to pink, red, or brown. Any of the following symptoms occur over the next 10  days:  You have a temperature by mouth above 102 F (38.9 C), not controlled by medicine.  Shortness of breath.  Weakness after normal activity.  The white part of the eye turns yellow (jaundice).  You have a decrease in the amount of urine or are urinating less often.  Your urine turns a dark color or changes to pink, red, or brown. Document Released: 01/23/2000 Document Revised: 04/19/2011 Document Reviewed: 09/11/2007 [facility] Patient Information 2014 [place], [place].  _______________________________________________________________________

## 2018-12-28 NOTE — Progress Notes (Signed)
PCP -  Lyndal Rainbow  Cardiologist - Kathyrn Drown, PA w/ cardiac clearance in Epic dated 12/04/18   Chest x-ray -   EKG - 09-15-18  Stress Test -   ECHO -  Cardiac Cath -   Sleep Study -  CPAP -   Fasting Blood Sugar -  Checks Blood Sugar _____ times a day  Blood Thinner Instructions: Xarelto w/instructions to hold 3 days prior to surgery.   Aspirin Instructions:  Last Dose: 12-29-18  Anesthesia review:   Patient denies shortness of breath, fever, cough and chest pain at PAT appointment   Patient verbalized understanding of instructions that were given to them at the PAT appointment. Patient was also instructed that they will need to review over the PAT instructions again at home before surgery.

## 2018-12-29 ENCOUNTER — Other Ambulatory Visit (HOSPITAL_COMMUNITY)
Admission: RE | Admit: 2018-12-29 | Discharge: 2018-12-29 | Disposition: A | Discharge status: Home / Self Care | Payer: Medicare Other | Source: Ambulatory Visit | Attending: Orthopedic Surgery | Admitting: Orthopedic Surgery

## 2018-12-29 ENCOUNTER — Other Ambulatory Visit: Payer: Self-pay

## 2018-12-29 ENCOUNTER — Encounter (HOSPITAL_COMMUNITY): Payer: Self-pay

## 2018-12-29 ENCOUNTER — Encounter (HOSPITAL_COMMUNITY)
Admission: RE | Admit: 2018-12-29 | Discharge: 2018-12-29 | Disposition: A | Discharge status: Home / Self Care | Payer: Medicare Other | Source: Ambulatory Visit | Attending: Orthopedic Surgery | Admitting: Orthopedic Surgery

## 2018-12-29 DIAGNOSIS — Z79899 Other long term (current) drug therapy: Secondary | ICD-10-CM | POA: Insufficient documentation

## 2018-12-29 DIAGNOSIS — Z20828 Contact with and (suspected) exposure to other viral communicable diseases: Secondary | ICD-10-CM | POA: Insufficient documentation

## 2018-12-29 DIAGNOSIS — I509 Heart failure, unspecified: Secondary | ICD-10-CM | POA: Diagnosis not present

## 2018-12-29 DIAGNOSIS — E039 Hypothyroidism, unspecified: Secondary | ICD-10-CM | POA: Diagnosis not present

## 2018-12-29 DIAGNOSIS — K219 Gastro-esophageal reflux disease without esophagitis: Secondary | ICD-10-CM | POA: Insufficient documentation

## 2018-12-29 DIAGNOSIS — T8454XA Infection and inflammatory reaction due to internal left knee prosthesis, initial encounter: Secondary | ICD-10-CM | POA: Diagnosis not present

## 2018-12-29 DIAGNOSIS — G4730 Sleep apnea, unspecified: Secondary | ICD-10-CM | POA: Diagnosis not present

## 2018-12-29 DIAGNOSIS — I4891 Unspecified atrial fibrillation: Secondary | ICD-10-CM | POA: Insufficient documentation

## 2018-12-29 DIAGNOSIS — X58XXXA Exposure to other specified factors, initial encounter: Secondary | ICD-10-CM | POA: Insufficient documentation

## 2018-12-29 DIAGNOSIS — Z01818 Encounter for other preprocedural examination: Secondary | ICD-10-CM | POA: Diagnosis present

## 2018-12-29 DIAGNOSIS — Z79890 Hormone replacement therapy (postmenopausal): Secondary | ICD-10-CM | POA: Insufficient documentation

## 2018-12-29 DIAGNOSIS — Z7901 Long term (current) use of anticoagulants: Secondary | ICD-10-CM | POA: Diagnosis not present

## 2018-12-29 DIAGNOSIS — M009 Pyogenic arthritis, unspecified: Secondary | ICD-10-CM | POA: Diagnosis not present

## 2018-12-29 DIAGNOSIS — Z96651 Presence of right artificial knee joint: Secondary | ICD-10-CM | POA: Insufficient documentation

## 2018-12-29 DIAGNOSIS — Z86718 Personal history of other venous thrombosis and embolism: Secondary | ICD-10-CM | POA: Insufficient documentation

## 2018-12-29 LAB — CBC
HCT: 37.9 % — ABNORMAL LOW (ref 39.0–52.0)
Hemoglobin: 11.4 g/dL — ABNORMAL LOW (ref 13.0–17.0)
MCH: 28.9 pg (ref 26.0–34.0)
MCHC: 30.1 g/dL (ref 30.0–36.0)
MCV: 96.2 fL (ref 80.0–100.0)
Platelets: 274 10*3/uL (ref 150–400)
RBC: 3.94 MIL/uL — ABNORMAL LOW (ref 4.22–5.81)
RDW: 15.1 % (ref 11.5–15.5)
WBC: 4.9 10*3/uL (ref 4.0–10.5)
nRBC: 0 % (ref 0.0–0.2)

## 2018-12-29 LAB — BASIC METABOLIC PANEL
Anion gap: 10 (ref 5–15)
BUN: 22 mg/dL (ref 8–23)
CO2: 22 mmol/L (ref 22–32)
Calcium: 10 mg/dL (ref 8.9–10.3)
Chloride: 108 mmol/L (ref 98–111)
Creatinine, Ser: 1.08 mg/dL (ref 0.61–1.24)
GFR calc Af Amer: >60 mL/min (ref >60)
GFR calc non Af Amer: >60 mL/min (ref >60)
Glucose, Bld: 104 mg/dL — ABNORMAL HIGH (ref 70–99)
Potassium: 4.4 mmol/L (ref 3.5–5.1)
Sodium: 140 mmol/L (ref 135–145)

## 2018-12-29 LAB — SURGICAL PCR SCREEN
MRSA, PCR: NEGATIVE
Staphylococcus aureus: NEGATIVE

## 2018-12-31 LAB — NOVEL CORONAVIRUS, NAA (HOSP ORDER, SEND-OUT TO REF LAB; TAT 18-24 HRS): SARS-CoV-2, NAA: NOT DETECTED

## 2019-01-01 MED ORDER — DEXTROSE 5 % IV SOLN
3.0000 g | INTRAVENOUS | Status: AC
  Administered 2019-01-02: 14:00:00 3 g via INTRAVENOUS
  Filled 2019-01-01: qty 3

## 2019-01-01 NOTE — Progress Notes (Signed)
Anesthesia Chart Review   Case: [id number] Date/Time: 01/02/19 1459   Procedure: Repeat Washout and placement of antibiotic spacer left knee (Left Knee) - 2 hrs   Anesthesia type: Spinal   Pre-op diagnosis: Infected left total knee   [facility] ROOM 10 / [facility]   Surgeon: Paralee Cancel, MD      DISCUSSION:68 y.o. never smoker with h/o hypothyroidism, GERD, A-fib (on Xarelto), CHF, sleep apnea w/cpap, infected left total knee scheduled for above procedure 01/02/2019 with Dr. Paralee Cancel.   Pt cleared by cardiology 12/05/2018.  Per OV note, "Given past medical history and time since last visit, based on ACC/AHA guidelines, Khoi Lombardi would be at acceptable risk for the planned procedure without further cardiovascular testing.  Per pharmacy, patient with diagnosis ofatrial fibrillation on Xareltofor anticoagulation with a CHADS2-VASc score of4(CHF, AGE, stroke/tia x 2/DVT/PE) and CrCl127 ml/min Per office protocol, patient can holdXareltofor 3days prior to procedure and resume when safe per surgical team. Case reviewed by Dr. Sallyanne Kuster during pre-operative evaluation."  Anticipate pt can proceed with planned procedure barring acute status change.   VS: BP 124/69 (BP Location: Left Arm)   Pulse 84   Temp 36.8 C (Oral)   Resp 18   Ht 5\' 8"  (1.727 m)   Wt (!) 171.7 kg   SpO2 98%   BMI 57.57 kg/m   PROVIDERS: Bernerd Limbo, MD is PCP   Sanda Klein, MD is Cardiologist  LABS: Labs reviewed: Acceptable for surgery. (all labs ordered are listed, but only abnormal results are displayed)  Labs Reviewed  BASIC METABOLIC PANEL - Abnormal; Notable for the following components:      Result Value   Glucose, Bld 104 (*)    All other components within normal limits  CBC - Abnormal; Notable for the following components:   RBC 3.94 (*)    Hemoglobin 11.4 (*)    HCT 37.9 (*)    All other components within normal limits  SURGICAL PCR SCREEN  TYPE AND SCREEN      IMAGES:   EKG: 09/15/2018 Rate 72 bpm Atrial fibrillation with premature ventricular or aberrantly conducted complexes  CV:  Past Medical History:  Diagnosis Date  . CHF (congestive heart failure) ([place])   . Chronic anticoagulation    with Xarelto  . Complication of anesthesia    PT STATES HARD TO WAKE UP AFTER ONE SUGERY -STATES THE SURGERY TOOK LONGER THAN EXPECTED.  NO PROBLEMS WITH ANY OTHER SURGERY  . Dysrhythmia    A-fib  . GERD (gastroesophageal reflux disease)   . Headache   . History of blood clots   . History of blood transfusion   . Hypothyroidism   . Pain    BACK PAIN - PT ATTRIBUTES TO THE WAY HE WALKS DUE TO LEFT KNEE PROBLEM  . Persistent atrial fibrillation ([place])   . Septic arthritis of knee ([facility])    LEFT KNEE  . Shortness of breath    WITH EXERTION AND PAIN  . Sleep apnea    uses CPAP,    Past Surgical History:  Procedure Laterality Date  . 2010 REMOVAL OF LEFT TOTAL KNEE    . CARDIAC CATHETERIZATION    . CARDIOVERSION N/A 08/28/2012   Procedure: CARDIOVERSION;  Surgeon: Sanda Klein, MD;  Location: [facility];  Service: Cardiovascular;  Laterality: N/A;  . CARDIOVERSION N/A 09/01/2012   Procedure: CARDIOVERSION;  Surgeon: Pixie Casino, MD;  Location: [facility];  Service: Cardiovascular;  Laterality: N/A;  . COLONOSCOPY N/A  06/13/2013   Procedure: COLONOSCOPY;  Surgeon: Inda Castle, MD;  Location: [facility];  Service: Endoscopy;  Laterality: N/A;  . EXCISIONAL TOTAL KNEE ARTHROPLASTY WITH ANTIBIOTIC SPACERS Left 09/21/2018   Procedure: Resection of tibia versus both components with placement of antibiotic spacer;  Surgeon: Paralee Cancel, MD;  [facility];  Service: Orthopedics;  Laterality: Left;  2.5 hrs  . HYDROCELECTOY   2012  . LEFT KNEE ARTHROSCOPY  1999  . LEFT KNEE SURGERY UPPER TIBIAL OSTOMY    . LEFT TOTAL KNEE REMOVAL FOR INFECTION  2006  . REIMPLANTATION LEFT TOTAL KNEE  2008  . REIMPLANTATION LEFT TOTAL KNEE   2006  .  REIMPLANTATION OF TOTAL KNEE Left 06/23/2012   Procedure: REIMPLANTATION OF LEFT TOTAL KNEE;  Surgeon: Mauri Pole, MD;  [facility];  Service: Orthopedics;  Laterality: Left;  . REMOVAL LEFT TOTAL KNEE   2008  . REPLACEMENT LEFT KNEE  2002  . REPLACEMENT RIGHT KNEE  2003  . REVISION LEFT KNEE CAP   2004  . RIGHT KNEE ARTHROSCOPY  1998  . TEE WITHOUT CARDIOVERSION N/A 08/28/2012   Procedure: TRANSESOPHAGEAL ECHOCARDIOGRAM (TEE);  Surgeon: Sanda Klein, MD;  Location: [place];  Service: Cardiovascular;  Laterality: N/A;  . TONSILLECTOMY    . TOTAL KNEE REVISION Left 12/15/2015   Procedure: TOTAL KNEE REVISION REPLACEMENT;  Surgeon: Paralee Cancel, MD;  [facility];  Service: Orthopedics;  Laterality: Left;  Adductor Block    MEDICATIONS: . diltiazem (CARDIZEM CD) 240 MG 24 hr capsule  . docusate sodium (COLACE) 100 MG capsule  . ferrous sulfate (FERROUSUL) 325 (65 FE) MG tablet  . furosemide (LASIX) 20 MG tablet  . HYDROcodone-acetaminophen (NORCO) 7.5-325 MG tablet  . levothyroxine (SYNTHROID) 125 MCG tablet  . methocarbamol (ROBAXIN) 500 MG tablet  . metoprolol tartrate (LOPRESSOR) 50 MG tablet  . Multiple Vitamin (MULTIVITAMIN WITH MINERALS) TABS  . polyethylene glycol (MIRALAX / GLYCOLAX) 17 g packet  . rivaroxaban (XARELTO) 20 MG TABS tablet   No current facility-administered medications for this encounter.    Derrill Memo ON 01/02/2019] ceFAZolin (ANCEF) 3 g in dextrose 5 % 50 mL IVPB     Maia Plan [facility] Pre-Surgical Testing [phone] 01/01/19  11:01 AM

## 2019-01-02 ENCOUNTER — Inpatient Hospital Stay (HOSPITAL_COMMUNITY): Payer: Medicare Other | Admitting: Anesthesiology

## 2019-01-02 ENCOUNTER — Telehealth (HOSPITAL_COMMUNITY): Payer: Self-pay | Admitting: *Deleted

## 2019-01-02 ENCOUNTER — Inpatient Hospital Stay (HOSPITAL_COMMUNITY)
Admission: RE | Admit: 2019-01-02 | Discharge: 2019-01-04 | DRG: 486 | Disposition: A | Discharge status: Home Health | Payer: Medicare Other | Attending: Orthopedic Surgery | Admitting: Orthopedic Surgery

## 2019-01-02 ENCOUNTER — Encounter (HOSPITAL_COMMUNITY): Payer: Self-pay | Admitting: Anesthesiology

## 2019-01-02 ENCOUNTER — Inpatient Hospital Stay: Payer: Self-pay

## 2019-01-02 ENCOUNTER — Inpatient Hospital Stay (HOSPITAL_COMMUNITY): Payer: Medicare Other | Admitting: Physician Assistant

## 2019-01-02 ENCOUNTER — Encounter (HOSPITAL_COMMUNITY)
Admission: RE | Disposition: A | Discharge status: Home Health | Payer: Self-pay | Source: Home / Self Care | Attending: Orthopedic Surgery

## 2019-01-02 ENCOUNTER — Other Ambulatory Visit: Payer: Self-pay

## 2019-01-02 DIAGNOSIS — E039 Hypothyroidism, unspecified: Secondary | ICD-10-CM | POA: Diagnosis present

## 2019-01-02 DIAGNOSIS — T8453XA Infection and inflammatory reaction due to internal right knee prosthesis, initial encounter: Secondary | ICD-10-CM | POA: Diagnosis not present

## 2019-01-02 DIAGNOSIS — Z6841 Body Mass Index (BMI) 40.0 and over, adult: Secondary | ICD-10-CM

## 2019-01-02 DIAGNOSIS — Z89521 Acquired absence of right knee: Secondary | ICD-10-CM | POA: Diagnosis not present

## 2019-01-02 DIAGNOSIS — Z96651 Presence of right artificial knee joint: Secondary | ICD-10-CM | POA: Diagnosis present

## 2019-01-02 DIAGNOSIS — Z86711 Personal history of pulmonary embolism: Secondary | ICD-10-CM | POA: Diagnosis not present

## 2019-01-02 DIAGNOSIS — I5032 Chronic diastolic (congestive) heart failure: Secondary | ICD-10-CM | POA: Diagnosis present

## 2019-01-02 DIAGNOSIS — Z833 Family history of diabetes mellitus: Secondary | ICD-10-CM

## 2019-01-02 DIAGNOSIS — K219 Gastro-esophageal reflux disease without esophagitis: Secondary | ICD-10-CM | POA: Diagnosis present

## 2019-01-02 DIAGNOSIS — Z7901 Long term (current) use of anticoagulants: Secondary | ICD-10-CM | POA: Diagnosis not present

## 2019-01-02 DIAGNOSIS — Z792 Long term (current) use of antibiotics: Secondary | ICD-10-CM | POA: Diagnosis not present

## 2019-01-02 DIAGNOSIS — Z86718 Personal history of other venous thrombosis and embolism: Secondary | ICD-10-CM

## 2019-01-02 DIAGNOSIS — Y831 Surgical operation with implant of artificial internal device as the cause of abnormal reaction of the patient, or of later complication, without mention of misadventure at the time of the procedure: Secondary | ICD-10-CM | POA: Diagnosis present

## 2019-01-02 DIAGNOSIS — Z79899 Other long term (current) drug therapy: Secondary | ICD-10-CM | POA: Diagnosis not present

## 2019-01-02 DIAGNOSIS — T8454XA Infection and inflammatory reaction due to internal left knee prosthesis, initial encounter: Secondary | ICD-10-CM | POA: Diagnosis present

## 2019-01-02 DIAGNOSIS — Z79890 Hormone replacement therapy (postmenopausal): Secondary | ICD-10-CM

## 2019-01-02 DIAGNOSIS — Z20828 Contact with and (suspected) exposure to other viral communicable diseases: Secondary | ICD-10-CM | POA: Diagnosis present

## 2019-01-02 DIAGNOSIS — B954 Other streptococcus as the cause of diseases classified elsewhere: Secondary | ICD-10-CM | POA: Diagnosis not present

## 2019-01-02 DIAGNOSIS — Z8614 Personal history of Methicillin resistant Staphylococcus aureus infection: Secondary | ICD-10-CM

## 2019-01-02 DIAGNOSIS — Z800 Family history of malignant neoplasm of digestive organs: Secondary | ICD-10-CM

## 2019-01-02 DIAGNOSIS — Z885 Allergy status to narcotic agent status: Secondary | ICD-10-CM | POA: Diagnosis not present

## 2019-01-02 DIAGNOSIS — Z8249 Family history of ischemic heart disease and other diseases of the circulatory system: Secondary | ICD-10-CM

## 2019-01-02 DIAGNOSIS — I4819 Other persistent atrial fibrillation: Secondary | ICD-10-CM | POA: Diagnosis present

## 2019-01-02 DIAGNOSIS — Z89529 Acquired absence of unspecified knee: Secondary | ICD-10-CM

## 2019-01-02 HISTORY — PX: EXCISIONAL TOTAL KNEE ARTHROPLASTY WITH ANTIBIOTIC SPACERS: SHX5827

## 2019-01-02 LAB — TYPE AND SCREEN
ABO/RH(D): O POS
Antibody Screen: NEGATIVE

## 2019-01-02 LAB — GLUCOSE, CAPILLARY: Glucose-Capillary: 92 mg/dL (ref 70–99)

## 2019-01-02 SURGERY — REMOVAL, TOTAL ARTHROPLASTY HARDWARE, KNEE, WITH ANTIBIOTIC SPACER INSERTION
Anesthesia: Spinal | Site: Knee | Laterality: Left

## 2019-01-02 MED ORDER — CEFAZOLIN SODIUM-DEXTROSE 2-4 GM/100ML-% IV SOLN
2.0000 g | Freq: Four times a day (QID) | INTRAVENOUS | Status: AC
  Administered 2019-01-02 – 2019-01-03 (×2): 2 g via INTRAVENOUS
  Filled 2019-01-02 (×2): qty 100

## 2019-01-02 MED ORDER — OXYCODONE HCL 5 MG PO TABS
5.0000 mg | ORAL_TABLET | ORAL | Status: DC | PRN
  Administered 2019-01-02: 10 mg via ORAL

## 2019-01-02 MED ORDER — PROPOFOL 10 MG/ML IV BOLUS
INTRAVENOUS | Status: AC
  Filled 2019-01-02: qty 20

## 2019-01-02 MED ORDER — CELECOXIB 200 MG PO CAPS
200.0000 mg | ORAL_CAPSULE | Freq: Two times a day (BID) | ORAL | Status: DC
  Administered 2019-01-02 – 2019-01-04 (×4): 200 mg via ORAL
  Filled 2019-01-02 (×4): qty 1

## 2019-01-02 MED ORDER — FERROUS SULFATE 325 (65 FE) MG PO TABS
325.0000 mg | ORAL_TABLET | Freq: Two times a day (BID) | ORAL | Status: DC
  Administered 2019-01-03 – 2019-01-04 (×3): 325 mg via ORAL
  Filled 2019-01-02 (×3): qty 1

## 2019-01-02 MED ORDER — MIDAZOLAM HCL 5 MG/5ML IJ SOLN
INTRAMUSCULAR | Status: DC | PRN
  Administered 2019-01-02: 2 mg via INTRAVENOUS

## 2019-01-02 MED ORDER — DIPHENHYDRAMINE HCL 12.5 MG/5ML PO ELIX: 12.5000 mg | ORAL_SOLUTION | ORAL | Status: DC | PRN

## 2019-01-02 MED ORDER — METHOCARBAMOL 500 MG IVPB - SIMPLE MED
INTRAVENOUS | Status: AC
  Filled 2019-01-02: qty 50

## 2019-01-02 MED ORDER — PROPOFOL 10 MG/ML IV BOLUS
INTRAVENOUS | Status: DC | PRN
  Administered 2019-01-02 (×2): 30 mg via INTRAVENOUS

## 2019-01-02 MED ORDER — POVIDONE-IODINE 10 % EX SWAB
2.0000 "application " | Freq: Once | CUTANEOUS | Status: AC
  Administered 2019-01-02: 2 via TOPICAL

## 2019-01-02 MED ORDER — SODIUM CHLORIDE (PF) 0.9 % IJ SOLN
INTRAMUSCULAR | Status: AC
  Filled 2019-01-02: qty 50

## 2019-01-02 MED ORDER — TOBRAMYCIN SULFATE 1.2 G IJ SOLR
INTRAMUSCULAR | Status: DC | PRN
  Administered 2019-01-02: 3.6 g

## 2019-01-02 MED ORDER — BUPIVACAINE IN DEXTROSE 0.75-8.25 % IT SOLN
INTRATHECAL | Status: DC | PRN
  Administered 2019-01-02: 1.8 mL via INTRATHECAL

## 2019-01-02 MED ORDER — FENTANYL CITRATE (PF) 100 MCG/2ML IJ SOLN
25.0000 ug | INTRAMUSCULAR | Status: DC | PRN
  Administered 2019-01-02: 17:00:00 50 ug via INTRAVENOUS

## 2019-01-02 MED ORDER — METOCLOPRAMIDE HCL 5 MG/ML IJ SOLN: 5.0000 mg | Freq: Three times a day (TID) | INTRAMUSCULAR | Status: DC | PRN

## 2019-01-02 MED ORDER — DEXAMETHASONE SODIUM PHOSPHATE 10 MG/ML IJ SOLN: 10.0000 mg | Freq: Once | INTRAMUSCULAR | Status: DC

## 2019-01-02 MED ORDER — BISACODYL 10 MG RE SUPP: 10.0000 mg | Freq: Every day | RECTAL | Status: DC | PRN

## 2019-01-02 MED ORDER — DEXAMETHASONE SODIUM PHOSPHATE 10 MG/ML IJ SOLN
10.0000 mg | Freq: Once | INTRAMUSCULAR | Status: AC
  Administered 2019-01-03: 10 mg via INTRAVENOUS
  Filled 2019-01-02: qty 1

## 2019-01-02 MED ORDER — BUPIVACAINE-EPINEPHRINE 0.25% -1:200000 IJ SOLN
INTRAMUSCULAR | Status: AC
  Filled 2019-01-02: qty 1

## 2019-01-02 MED ORDER — ONDANSETRON HCL 4 MG PO TABS: 4.0000 mg | ORAL_TABLET | Freq: Four times a day (QID) | ORAL | Status: DC | PRN

## 2019-01-02 MED ORDER — TRANEXAMIC ACID-NACL 1000-0.7 MG/100ML-% IV SOLN
1000.0000 mg | Freq: Once | INTRAVENOUS | Status: AC
  Administered 2019-01-02: 1000 mg via INTRAVENOUS
  Filled 2019-01-02: qty 100

## 2019-01-02 MED ORDER — FENTANYL CITRATE (PF) 100 MCG/2ML IJ SOLN
50.0000 ug | INTRAMUSCULAR | Status: DC
  Administered 2019-01-02: 50 ug via INTRAVENOUS
  Filled 2019-01-02: qty 2

## 2019-01-02 MED ORDER — MIDAZOLAM HCL 2 MG/2ML IJ SOLN
1.0000 mg | INTRAMUSCULAR | Status: DC
  Administered 2019-01-02: 1 mg via INTRAVENOUS
  Filled 2019-01-02: qty 2

## 2019-01-02 MED ORDER — MENTHOL 3 MG MT LOZG: 1.0000 | LOZENGE | OROMUCOSAL | Status: DC | PRN

## 2019-01-02 MED ORDER — RIVAROXABAN 10 MG PO TABS
10.0000 mg | ORAL_TABLET | ORAL | Status: DC
  Administered 2019-01-03 – 2019-01-04 (×2): 10 mg via ORAL
  Filled 2019-01-02 (×2): qty 1

## 2019-01-02 MED ORDER — POLYETHYLENE GLYCOL 3350 17 G PO PACK
17.0000 g | PACK | Freq: Two times a day (BID) | ORAL | Status: DC
  Administered 2019-01-02 – 2019-01-03 (×3): 17 g via ORAL
  Filled 2019-01-02 (×4): qty 1

## 2019-01-02 MED ORDER — TRANEXAMIC ACID-NACL 1000-0.7 MG/100ML-% IV SOLN
1000.0000 mg | INTRAVENOUS | Status: AC
  Administered 2019-01-02: 1000 mg via INTRAVENOUS
  Filled 2019-01-02: qty 100

## 2019-01-02 MED ORDER — OXYCODONE HCL 5 MG PO TABS
5.0000 mg | ORAL_TABLET | ORAL | Status: DC | PRN
  Administered 2019-01-03 (×2): 10 mg via ORAL
  Filled 2019-01-02: qty 2

## 2019-01-02 MED ORDER — METOPROLOL TARTRATE 50 MG PO TABS
50.0000 mg | ORAL_TABLET | Freq: Two times a day (BID) | ORAL | Status: DC
  Administered 2019-01-02 – 2019-01-04 (×4): 50 mg via ORAL
  Filled 2019-01-02 (×4): qty 1

## 2019-01-02 MED ORDER — VANCOMYCIN HCL 1000 MG IV SOLR
INTRAVENOUS | Status: DC | PRN
  Administered 2019-01-02: 4000 mg

## 2019-01-02 MED ORDER — PROPOFOL 500 MG/50ML IV EMUL
INTRAVENOUS | Status: AC
  Filled 2019-01-02: qty 50

## 2019-01-02 MED ORDER — SODIUM CHLORIDE 0.9 % IV SOLN
INTRAVENOUS | Status: DC
  Administered 2019-01-02: 19:00:00 via INTRAVENOUS

## 2019-01-02 MED ORDER — LEVOTHYROXINE SODIUM 125 MCG PO TABS
125.0000 ug | ORAL_TABLET | Freq: Every day | ORAL | Status: DC
  Administered 2019-01-03 – 2019-01-04 (×2): 125 ug via ORAL
  Filled 2019-01-02 (×2): qty 1

## 2019-01-02 MED ORDER — PHENOL 1.4 % MT LIQD: 1.0000 | OROMUCOSAL | Status: DC | PRN

## 2019-01-02 MED ORDER — ONDANSETRON HCL 4 MG/2ML IJ SOLN: 4.0000 mg | Freq: Four times a day (QID) | INTRAMUSCULAR | Status: DC | PRN

## 2019-01-02 MED ORDER — ROPIVACAINE HCL 7.5 MG/ML IJ SOLN
INTRAMUSCULAR | Status: DC | PRN
  Administered 2019-01-02: 20 mL via PERINEURAL

## 2019-01-02 MED ORDER — METOCLOPRAMIDE HCL 5 MG PO TABS: 5.0000 mg | ORAL_TABLET | Freq: Three times a day (TID) | ORAL | Status: DC | PRN

## 2019-01-02 MED ORDER — FENTANYL CITRATE (PF) 100 MCG/2ML IJ SOLN
INTRAMUSCULAR | Status: AC
  Administered 2019-01-02: 50 ug via INTRAVENOUS
  Filled 2019-01-02: qty 2

## 2019-01-02 MED ORDER — OXYCODONE HCL 5 MG PO TABS
10.0000 mg | ORAL_TABLET | ORAL | Status: DC | PRN
  Administered 2019-01-03 – 2019-01-04 (×4): 10 mg via ORAL
  Filled 2019-01-02 (×5): qty 2

## 2019-01-02 MED ORDER — METHOCARBAMOL 500 MG IVPB - SIMPLE MED
500.0000 mg | Freq: Four times a day (QID) | INTRAVENOUS | Status: DC | PRN
  Administered 2019-01-02: 500 mg via INTRAVENOUS
  Filled 2019-01-02: qty 50

## 2019-01-02 MED ORDER — VANCOMYCIN HCL 1000 MG IV SOLR
INTRAVENOUS | Status: DC | PRN
  Administered 2019-01-02: 1000 mg via INTRAVENOUS

## 2019-01-02 MED ORDER — SODIUM CHLORIDE 0.9 % IR SOLN
Status: DC | PRN
  Administered 2019-01-02: 6000 mL

## 2019-01-02 MED ORDER — TOBRAMYCIN SULFATE 1.2 G IJ SOLR
INTRAMUSCULAR | Status: AC
  Filled 2019-01-02: qty 4.8

## 2019-01-02 MED ORDER — DILTIAZEM HCL ER COATED BEADS 240 MG PO CP24
240.0000 mg | ORAL_CAPSULE | Freq: Every day | ORAL | Status: DC
  Administered 2019-01-03 – 2019-01-04 (×2): 240 mg via ORAL
  Filled 2019-01-02 (×2): qty 1

## 2019-01-02 MED ORDER — VANCOMYCIN HCL 10 G IV SOLR
1250.0000 mg | Freq: Two times a day (BID) | INTRAVENOUS | Status: DC
  Administered 2019-01-02 – 2019-01-04 (×4): 1250 mg via INTRAVENOUS
  Filled 2019-01-02 (×6): qty 1250

## 2019-01-02 MED ORDER — PROPOFOL 500 MG/50ML IV EMUL
INTRAVENOUS | Status: DC | PRN
  Administered 2019-01-02: 100 ug/kg/min via INTRAVENOUS

## 2019-01-02 MED ORDER — MIDAZOLAM HCL 2 MG/2ML IJ SOLN
INTRAMUSCULAR | Status: AC
  Filled 2019-01-02: qty 2

## 2019-01-02 MED ORDER — ONDANSETRON HCL 4 MG/2ML IJ SOLN: 4.0000 mg | Freq: Once | INTRAMUSCULAR | Status: DC | PRN

## 2019-01-02 MED ORDER — KETOROLAC TROMETHAMINE 30 MG/ML IJ SOLN
INTRAMUSCULAR | Status: AC
  Filled 2019-01-02: qty 1

## 2019-01-02 MED ORDER — CHLORHEXIDINE GLUCONATE 4 % EX LIQD
60.0000 mL | Freq: Once | CUTANEOUS | Status: AC
  Administered 2019-01-02: 4 via TOPICAL

## 2019-01-02 MED ORDER — ALUM & MAG HYDROXIDE-SIMETH 200-200-20 MG/5ML PO SUSP: 15.0000 mL | ORAL | Status: DC | PRN

## 2019-01-02 MED ORDER — METHOCARBAMOL 500 MG PO TABS
500.0000 mg | ORAL_TABLET | Freq: Four times a day (QID) | ORAL | Status: DC | PRN
  Administered 2019-01-03 (×2): 500 mg via ORAL
  Filled 2019-01-02 (×2): qty 1

## 2019-01-02 MED ORDER — OXYCODONE HCL 5 MG PO TABS
10.0000 mg | ORAL_TABLET | ORAL | Status: DC | PRN
  Filled 2019-01-02: qty 2

## 2019-01-02 MED ORDER — PROPOFOL 500 MG/50ML IV EMUL
INTRAVENOUS | Status: AC
  Filled 2019-01-02: qty 100

## 2019-01-02 MED ORDER — DOCUSATE SODIUM 100 MG PO CAPS
100.0000 mg | ORAL_CAPSULE | Freq: Two times a day (BID) | ORAL | Status: DC
  Administered 2019-01-02 – 2019-01-04 (×4): 100 mg via ORAL
  Filled 2019-01-02 (×4): qty 1

## 2019-01-02 MED ORDER — ACETAMINOPHEN 325 MG PO TABS: 325.0000 mg | ORAL_TABLET | Freq: Four times a day (QID) | ORAL | Status: DC | PRN

## 2019-01-02 MED ORDER — FENTANYL CITRATE (PF) 100 MCG/2ML IJ SOLN
25.0000 ug | INTRAMUSCULAR | Status: DC | PRN
  Administered 2019-01-02: 25 ug via INTRAVENOUS
  Filled 2019-01-02: qty 2

## 2019-01-02 MED ORDER — VANCOMYCIN HCL 1000 MG IV SOLR
INTRAVENOUS | Status: AC
  Filled 2019-01-02: qty 4000

## 2019-01-02 MED ORDER — LACTATED RINGERS IV SOLN
INTRAVENOUS | Status: DC
  Administered 2019-01-02: 13:00:00 via INTRAVENOUS

## 2019-01-02 MED ORDER — MAGNESIUM CITRATE PO SOLN: 1.0000 | Freq: Once | ORAL | Status: DC | PRN

## 2019-01-02 SURGICAL SUPPLY — 56 items
BAG SPEC THK2 15X12 ZIP CLS (MISCELLANEOUS) ×1
BAG ZIPLOCK 12X15 (MISCELLANEOUS) ×2 IMPLANT
BLADE SAW SGTL 13.0X1.19X90.0M (BLADE) ×2 IMPLANT
BLADE SAW SGTL 81X20 HD (BLADE) ×2 IMPLANT
BNDG ELASTIC 6X5.8 VLCR STR LF (GAUZE/BANDAGES/DRESSINGS) ×2 IMPLANT
BOWL SMART MIX CTS (DISPOSABLE) ×2 IMPLANT
BRUSH FEMORAL CANAL (MISCELLANEOUS) ×2 IMPLANT
CEMENT HV SMART SET (Cement) ×4 IMPLANT
COVER SURGICAL LIGHT HANDLE (MISCELLANEOUS) ×2 IMPLANT
CUFF TOURN SGL QUICK 34 (TOURNIQUET CUFF) ×1
CUFF TRNQT CYL 34X4.125X (TOURNIQUET CUFF) ×1 IMPLANT
DERMABOND ADVANCED (GAUZE/BANDAGES/DRESSINGS)
DERMABOND ADVANCED .7 DNX12 (GAUZE/BANDAGES/DRESSINGS) IMPLANT
DRAPE U-SHAPE 47X51 STRL (DRAPES) ×2 IMPLANT
DRESSING AQUACEL AG SP 3.5X10 (GAUZE/BANDAGES/DRESSINGS) ×1 IMPLANT
DRSG AQUACEL AG SP 3.5X10 (GAUZE/BANDAGES/DRESSINGS) ×2
DRSG CURAD 3X16 NADH (PACKING) ×2 IMPLANT
DRSG PAD ABDOMINAL 8X10 ST (GAUZE/BANDAGES/DRESSINGS) ×2 IMPLANT
DURAPREP 26ML APPLICATOR (WOUND CARE) ×4 IMPLANT
ELECT REM PT RETURN 15FT ADLT (MISCELLANEOUS) ×2 IMPLANT
GAUZE SPONGE 4X4 12PLY STRL (GAUZE/BANDAGES/DRESSINGS) ×2 IMPLANT
GLOVE BIO SURGEON STRL SZ 6 (GLOVE) ×4 IMPLANT
GLOVE BIOGEL PI IND STRL 6.5 (GLOVE) ×1 IMPLANT
GLOVE BIOGEL PI IND STRL 8.5 (GLOVE) ×1 IMPLANT
GLOVE BIOGEL PI INDICATOR 6.5 (GLOVE) ×1
GLOVE BIOGEL PI INDICATOR 8.5 (GLOVE) ×1
GLOVE ECLIPSE 8.0 STRL XLNG CF (GLOVE) ×4 IMPLANT
GLOVE INDICATOR 7.5 STRL GRN (GLOVE) ×2 IMPLANT
GLOVE ORTHO TXT STRL SZ7.5 (GLOVE) ×4 IMPLANT
GOWN STRL REUS W/TWL LRG LVL3 (GOWN DISPOSABLE) ×4 IMPLANT
GOWN STRL REUS W/TWL XL LVL3 (GOWN DISPOSABLE) ×2 IMPLANT
HANDPIECE INTERPULSE COAX TIP (DISPOSABLE) ×2
INSERT TIBIAL KNEE SZ5 25MM (Insert) ×2 IMPLANT
JET LAVAGE IRRISEPT WOUND (IRRIGATION / IRRIGATOR) ×2
KIT STIMULAN RAPID CURE  10CC (Orthopedic Implant) ×1 IMPLANT
KIT STIMULAN RAPID CURE 10CC (Orthopedic Implant) ×1 IMPLANT
KIT TURNOVER KIT A (KITS) IMPLANT
LAVAGE JET IRRISEPT WOUND (IRRIGATION / IRRIGATOR) ×1 IMPLANT
MANIFOLD NEPTUNE II (INSTRUMENTS) ×2 IMPLANT
NS IRRIG 1000ML POUR BTL (IV SOLUTION) ×2 IMPLANT
PACK TOTAL KNEE CUSTOM (KITS) ×2 IMPLANT
PADDING CAST COTTON 6X4 STRL (CAST SUPPLIES) ×2 IMPLANT
PENCIL SMOKE EVACUATOR (MISCELLANEOUS) IMPLANT
PROTECTOR NERVE ULNAR (MISCELLANEOUS) ×2 IMPLANT
SET HNDPC FAN SPRY TIP SCT (DISPOSABLE) ×1 IMPLANT
SET PAD KNEE POSITIONER (MISCELLANEOUS) ×2 IMPLANT
STAPLER VISISTAT 35W (STAPLE) ×4 IMPLANT
SUT MNCRL AB 3-0 PS2 18 (SUTURE) ×2 IMPLANT
SUT STRATAFIX PDS+ 0 24IN (SUTURE) ×2 IMPLANT
SUT VIC AB 1 CT1 36 (SUTURE) ×4 IMPLANT
SUT VIC AB 2-0 CT1 27 (SUTURE) ×4
SUT VIC AB 2-0 CT1 TAPERPNT 27 (SUTURE) ×4 IMPLANT
TRAY FOLEY MTR SLVR 16FR STAT (SET/KITS/TRAYS/PACK) ×2 IMPLANT
WATER STERILE IRR 1000ML POUR (IV SOLUTION) ×2 IMPLANT
WRAP KNEE MAXI GEL POST OP (GAUZE/BANDAGES/DRESSINGS) ×2 IMPLANT
YANKAUER SUCT BULB TIP 10FT TU (MISCELLANEOUS) ×2 IMPLANT

## 2019-01-02 NOTE — Anesthesia Procedure Notes (Signed)
Anesthesia Regional Block: Adductor canal block   Pre-Anesthetic Checklist: ,, timeout performed, Correct Patient, Correct Site, Correct Laterality, Correct Procedure, Correct Position, site marked, Risks and benefits discussed,  Surgical consent,  Pre-op evaluation,  At surgeon's request and post-op pain management  Laterality: Left  Prep: chloraprep       Needles:  Injection technique: Single-shot  Needle Type: Echogenic Needle     Needle Length: 10cm  Needle Gauge: 21     Additional Needles:   Narrative:  Start time: 01/02/2019 12:56 PM End time: 01/02/2019 1:00 PM Injection made incrementally with aspirations every 5 mL.  Performed by: Personally  Anesthesiologist: Audry Pili, MD  Additional Notes: No pain on injection. No increased resistance to injection. Injection made in 5cc increments. Good needle visualization. Patient tolerated the procedure well.

## 2019-01-02 NOTE — Progress Notes (Signed)
Received order for PICC  

## 2019-01-02 NOTE — Anesthesia Postprocedure Evaluation (Signed)
Anesthesia Post Note  Patient: [name]  Procedure(s) Performed: Repeat Washout and placement of antibiotic spacer left knee (Left Knee)     Patient location during evaluation: PACU Anesthesia Type: Spinal Level of consciousness: awake and alert Pain management: pain level controlled Vital Signs Assessment: post-procedure vital signs reviewed and stable Respiratory status: spontaneous breathing and respiratory function stable Cardiovascular status: blood pressure returned to baseline and stable Postop Assessment: spinal receding and no apparent nausea or vomiting Anesthetic complications: no    Last Vitals:  Vitals:   01/02/19 1730 01/02/19 1800  BP: 118/79 (!) 112/53  Pulse: 63 92  Resp: 19 18  Temp:  (!) 36.4 C  SpO2: 99%     Last Pain:  Vitals:   01/02/19 1800  TempSrc: Oral  PainSc:                  Audry Pili

## 2019-01-02 NOTE — Brief Op Note (Signed)
01/02/2019  2:03 PM  PATIENT:  [name]  68 y.o. 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 and placement of tibial antibiotic spacer  PROCEDURE:  Procedure(s) with comments: Repeat Washout and placement of antibiotic spacer left knee (Left) - 2 hrs  SURGEON:  Surgeon(s) and Role:    Paralee Cancel, MD - Primary  PHYSICIAN ASSISTANT: Danae Orleans, PA-C  ANESTHESIA:   Regional plus spinal  EBL:  <50cc   BLOOD ADMINISTERED:none  DRAINS: none   LOCAL MEDICATIONS USED:  NONE  SPECIMEN:  Source of Specimen:  left knee synovial fluid  DISPOSITION OF SPECIMEN:  PATHOLOGY  COUNTS:  YES  TOURNIQUET:  60 min at 250 mmHg  DICTATION: .Other Dictation: Dictation Number [id number]  PLAN OF CARE: Admit to inpatient   PATIENT DISPOSITION:  PACU - hemodynamically stable.   Delay start of Pharmacological VTE agent (>24hrs) due to surgical blood loss or risk of bleeding: no

## 2019-01-02 NOTE — Progress Notes (Signed)
AssistedDr. Brock with left, ultrasound guided, adductor canal block. Side rails up, monitors on throughout procedure. See vital signs in flow sheet. Tolerated Procedure well.  

## 2019-01-02 NOTE — Interval H&P Note (Signed)
History and Physical Interval Note:  01/02/2019 12:30 PM  [name]  has presented today for surgery, with the diagnosis of Infected left total knee.  The various methods of treatment have been discussed with the patient and family. After consideration of risks, benefits and other options for treatment, the patient has consented to  Procedure(s) with comments: Repeat Washout and placement of antibiotic spacer left knee (Left) - 2 hrs as a surgical intervention.  The patient's history has been reviewed, patient examined, no change in status, stable for surgery.  I have reviewed the patient's chart and labs.  Questions were answered to the patient's satisfaction.     Mauri Pole

## 2019-01-02 NOTE — Progress Notes (Signed)
Attempt to place PICC in right arm.  Patient states that IV team failed in placing previous PICC and had to have PICC placed by interventional radiology.  Willing to allow this nurse to attempt.  Able to gain access to vein easily and thread PICC to almost mid chest area but unable to thread into SVC,  Attempted different positions; power flushing and use of radiology guide wire to no success.  Unable to place PICC centrally.  If PICC still desired recommend having interventional radiology place.  Thanks, [employer]

## 2019-01-02 NOTE — Transfer of Care (Signed)
Immediate Anesthesia Transfer of Care Note  Patient: [name]  Procedure(s) Performed: Repeat Washout and placement of antibiotic spacer left knee (Left Knee)  Patient Location: PACU  Anesthesia Type:Spinal  Level of Consciousness: awake and drowsy  Airway & Oxygen Therapy: Patient Spontanous Breathing and Patient connected to face mask oxygen  Post-op Assessment: Report given to RN and Post -op Vital signs reviewed and stable  Post vital signs: Reviewed and stable  Last Vitals:  Vitals Value Taken Time  BP    Temp    Pulse 92 01/02/19 1634  Resp 26 01/02/19 1634  SpO2 95 % 01/02/19 1634  Vitals shown include unvalidated device data.  Last Pain:  Vitals:   01/02/19 1126  TempSrc:   PainSc: 4          Complications: No apparent anesthesia complications

## 2019-01-02 NOTE — Anesthesia Procedure Notes (Signed)
Spinal  Patient location during procedure: OR Start time: 01/02/2019 2:04 PM End time: 01/02/2019 2:08 PM Staffing Anesthesiologist: Audry Pili, MD Performed: anesthesiologist  Preanesthetic Checklist Completed: patient identified, surgical consent, pre-op evaluation, timeout performed, IV checked, risks and benefits discussed and monitors and equipment checked Spinal Block Patient position: sitting Prep: DuraPrep Patient monitoring: heart rate, cardiac monitor, continuous pulse ox and blood pressure Approach: midline Location: L3-4 Injection technique: single-shot Needle Needle type: Quincke  Needle gauge: 22 G Needle length: 12.7 cm Additional Notes Consent was obtained prior to the procedure with all questions answered and concerns addressed. Risks including, but not limited to, bleeding, infection, nerve damage, paralysis, failed block, inadequate analgesia, allergic reaction, high spinal, itching, and headache were discussed and the patient wished to proceed. Functioning IV was confirmed and monitors were applied. Sterile prep and drape, including hand hygiene, mask, and sterile gloves were used. The patient was positioned and the spine was prepped. The skin was anesthetized with lidocaine. Free flow of clear CSF was obtained prior to injecting local anesthetic into the CSF. The spinal needle aspirated freely following injection. The needle was carefully withdrawn. The patient tolerated the procedure well.   Renold Don, MD

## 2019-01-02 NOTE — Progress Notes (Signed)
Pharmacy Antibiotic Note  [name] is a 68 y.o. male with hx prosthetic joint infection (left total knee), s/p placement of abx spacer on 09/21/18, who completed a 6 wks of abx with vancomycin and ceftriaxone from 09/21/18 - 11/01/18.  His ESR and CRP remain elevated s/p IV abx course and with concern for infection, he had repeat washout and placement of antibiotic spacer in the left knee on 01/02/19. Pharmacy has been consulted for vancomycin dosing.  Plan: - vanc 1gm IV x1 given in OR at 1442 (verified with OR staff). Vancomycin 1250 mg IV q12h  for est AUC 472 __________________________________________  Height: '5\' 8"'  (172.7 cm) Weight: (!) 376 lb 15.8 oz (171 kg) IBW/kg (Calculated) : 68.4  Temp (24hrs), Avg:97.8 F (36.6 C), Min:97.4 F (36.3 C), Max:98.2 F (36.8 C)  Recent Labs  Lab 12/29/18 1147  WBC 4.9  CREATININE 1.08    Estimated Creatinine Clearance: 102.7 mL/min (by C-G formula based on SCr of 1.08 mg/dL).    Allergies  Allergen Reactions  . Morphine Hives    Thank you for allowing pharmacy to be a part of this patient's care.  Lynelle Doctor 01/02/2019 4:46 PM

## 2019-01-02 NOTE — Anesthesia Preprocedure Evaluation (Addendum)
Anesthesia Evaluation  Patient identified by MRN, date of birth, ID band Patient awake    Reviewed: Allergy & Precautions, NPO status , Patient's Chart, lab work & pertinent test results, reviewed documented beta blocker date and time   History of Anesthesia Complications (+) PROLONGED EMERGENCE and history of anesthetic complications  Airway Mallampati: I  TM Distance: >3 FB Neck ROM: Full    Dental  (+) Dental Advisory Given, Teeth Intact   Pulmonary sleep apnea and Continuous Positive Airway Pressure Ventilation , PE   Pulmonary exam normal        Cardiovascular Normal cardiovascular exam+ dysrhythmias Atrial Fibrillation    '14 TTE - EF 50% to 55%. LA was severely dilated. RV was mildly dilated. RA was moderately dilated.   Pt cleared by cardiology 12/05/2018.  Per OV note, "Given past medical history and time since last visit, based on ACC/AHA guidelines,[name] be at acceptable risk for the planned procedure without further cardiovascular testing. Per pharmacy, patient with diagnosis ofatrial fibrillationon Xareltofor anticoagulationwith aCHADS2-VASc score of4(CHF, AGE, stroke/tia x 2/DVT/PE)andCrCl127 ml/min Per office protocol, patient can holdXareltofor 3days prior to procedureand resume when safe per surgical team. Case reviewed by Dr. Sallyanne Kuster during pre-operative evaluation."    Neuro/Psych  Headaches, negative psych ROS   GI/Hepatic Neg liver ROS, GERD  Controlled,  Endo/Other  diabetesHypothyroidism Morbid obesity  Renal/GU CRFRenal disease     Musculoskeletal  (+) Arthritis ,  Chronic back pain    Abdominal   Peds  Hematology  (+) anemia ,  Plt 274k    Anesthesia Other Findings Covid negative 11/20   Reproductive/Obstetrics                            Anesthesia Physical Anesthesia Plan  ASA: III  Anesthesia Plan: Spinal   Post-op Pain  Management:  Regional for Post-op pain   Induction:   PONV Risk Score and Plan: 1 and Treatment may vary due to age or medical condition and Propofol infusion  Airway Management Planned: Natural Airway and Simple Face Mask  Additional Equipment: None  Intra-op Plan:   Post-operative Plan:   Informed Consent: I have reviewed the patients History and Physical, chart, labs and discussed the procedure including the risks, benefits and alternatives for the proposed anesthesia with the patient or authorized representative who has indicated his/her understanding and acceptance.       Plan Discussed with: CRNA and Anesthesiologist  Anesthesia Plan Comments: ( )       Anesthesia Quick Evaluation

## 2019-01-02 NOTE — Op Note (Signed)
NAME: MIMS, MALTA MEDICAL RECORD [id number] ACCOUNT [id number] DATE OF BIRTH:Aug 07, 1950 FACILITY: [facility] LOCATION: [facility] PHYSICIAN:Carlota Philley D. 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  PROCEDURE:  Repeat excisional and non-excisional debridement of left total knee replacement, with removal of previously placed antibiotic spacer, and placement of new antibiotic tibial component.  This was a size 5 x 25 TC3 component.  SURGEON:  Paralee Cancel, MD  ASSISTANT:  Danae Orleans, PA-C.  Note that [name] was present for the entirety of the case from preoperative positioning, perioperative management of the operative extremity, general facilitation case and primary wound closure.  ANESTHESIA:  Regional plus spinal.  ESTIMATED BLOOD LOSS:  Less than 50 mL  DRAINS:  None.  BLOOD:  None given.   SPECIMEN:  I did send 10 mL of synovial fluid as well as culture swabs for Gram stain analysis with pathology.  TOURNIQUET:  Up for 60 minutes at 250 mmHg.  INDICATIONS:  The patient is a 69 year old male with a BMI of 57.  He had undergone a total knee arthroplasty that was complicated by failure of the implant and infection.  He underwent 2-stage reimplantation with revision components.  He presented to  the office earlier this year with recurrent infection.  At the time of his most recent resection I elected to not remove his femoral component as it was well fixed.  I am worried about the morbidity of removing his femoral component with bone loss  removing the sleeve portion  within the metaphysis.  For that reason, I left that component in place and I and D'd his knee and placed an antibiotic tibial poly plus removed his old patella.  He was treated with 6 weeks antibiotics and in the  postoperative period, he was noted to have some increasing pain, but more importantly elevated sedimentation rate and C-reactive protein.  I discussed with him, it was unsafe at this point to proceed with a final tibial component, but instead to redo the  I and D and try to cure this.  The same procedure would be performed with antibiotics and if this failed, then we would probably need to have further discussion about removing the femoral component.  The risks and benefits were discussed, specifically  the risk of recurrent persistent infection, DVT component failure and the need for future surgeries.  Consent was obtained.  PROCEDURE IN DETAIL:  The patient was brought to the operative theater.  Once adequate anesthesia was established, initial antibiotics were held until incision was made and Ancef and vancomycin were given.  His old incision was marked on the skin.  Left  lower extremity was prepped and draped in sterile fashion.  A timeout was performed identifying the patient, the planned procedure and extremity.  Leg was exsanguinated, tourniquet elevated to 250 mmHg.  His old incision was excised.  Soft tissue planes  created.  Median arthrotomy was made through very thickened scar tissue.  At this point, we encountered a murky synovial fluid that was blood-tinged.  It did not have obvious purulence, but did not look normal.  This correlated with his clinical  presentation in the office.  Given  these findings, we obtained fluid to send to pathology as well as his culture swabs.  At this point, the vast majority of this time was spent debriding the scar tissue within his needs for exposure purposes, but also debridement.  This was done extensively in the suprapatellar pouch, the medial  and lateral gutters.  I re-exposed the  patella with debridement.  Once I had the knee adequately debrided, which assisted with exposure and the knee, the knee was flexed and I then removed the tibial polyethylene and cement.  There was no significant bone loss done during this procedure,  however, we did note that the bone in the tibia was basically a cortical shell at this point.  I debrided down into the canal, opened this back up and used a canal brush irrigator to irrigate the canal further with about 500 mL of pulse lavage fluid.  We  then irrigated the knee with remaining 2500 mL with pulse lavage.  While this was going on, we mixed 1 gram of vancomycin and 1.2 of tobramycin into Stimulan beads.  These were configured on the back table.  At this point, we opened up a new  polyethylene insert, a 5 x 25 mm TC3 insert.  We all changed gloves.  While this was being done, we placed 900 mL of Irrisept antibacterial chlorhexidine solution in the knee and kept it in the knee for greater than 5 minutes.  We then mixed 2 batches of  cement with 2 grams of vancomycin and 2.4 mg of tobramycin.  I then cemented the polyethylene into the tibia and brought the knee to extension to allow the cement to cure.  Prior to placement of the tibial component, I did place antibiotic beads into  the canal of the tibia.  The knee was irrigated with another 3 liters of normal saline solution.  The remaining beads were placed in the medial and lateral gutters.  I also opened up a gram of vancomycin and sprinkled this throughout the wound, both in  the intra-articular surface as well as extraarticular.  The extensor mechanism was then reapproximated with #1 Vicryl and #1 Stratafix suture.  The remaining wound was closed with 2-0 Vicryl and staples on the skin.  The skin was then dressed with  Xeroform and a bulky dressing.  He was not placed in a knee immobilizer due to the size of the lower extremity.  Instead it was wrapped in  an Ace wrap with an ice pack.  He was then brought to the recovery room in stable condition tolerating the  procedure well.  Postoperatively we will order a PICC line.  We will have infectious disease get back involved and help select the antibiotic as well as duration.  Findings were reviewed with his sister in the note.  TN/NUANCE  D:01/02/2019 T:01/02/2019 JOB:[id number]

## 2019-01-02 NOTE — Significant Event (Signed)
Rapid Response Event Note  Overview: Time Called: 2008 Arrival Time: 2010 Event Type: MEWS  Initial Focused Assessment: Patient shivering in bed. HR reading on unit Dinamap of 166 bpm, patient has history of atrial fibrillation. Patient complaining of 8/10 pain in his left ankle and stating that it feels cold. Patient had debridement of the left knee today and other operative intervention to left knee.   Interventions: EKG performed, showed atrial fibrillation with a rate of 97 bpm. Most-likely Dinamap was picking up artifact from patient shivering.   Pedal pulse assessed on left foot and palpable, 2+. No edema noted. Left foot is warm to touch.  Rectal temperature checked. Patient's rectal temperature was 100.24F. Patient currently has multiple blankets on him because he feels cold.   Plan of Care (if not transferred): Patient's shivering is slowly decreasing.  PRN pain medications are available to treat patient's pain.  I recommend RN perform neurovascular checks to patient's left foot Q4H.   RN to consult rapid response for further needs.   Event Summary:  [name]

## 2019-01-03 ENCOUNTER — Inpatient Hospital Stay (HOSPITAL_COMMUNITY): Payer: Medicare Other

## 2019-01-03 ENCOUNTER — Encounter (HOSPITAL_COMMUNITY): Payer: Self-pay | Admitting: Orthopedic Surgery

## 2019-01-03 DIAGNOSIS — T8453XA Infection and inflammatory reaction due to internal right knee prosthesis, initial encounter: Secondary | ICD-10-CM

## 2019-01-03 DIAGNOSIS — Z792 Long term (current) use of antibiotics: Secondary | ICD-10-CM

## 2019-01-03 DIAGNOSIS — Z89521 Acquired absence of right knee: Secondary | ICD-10-CM

## 2019-01-03 DIAGNOSIS — B954 Other streptococcus as the cause of diseases classified elsewhere: Secondary | ICD-10-CM

## 2019-01-03 DIAGNOSIS — Z885 Allergy status to narcotic agent status: Secondary | ICD-10-CM

## 2019-01-03 LAB — CBC
HCT: 34.5 % — ABNORMAL LOW (ref 39.0–52.0)
Hemoglobin: 10.3 g/dL — ABNORMAL LOW (ref 13.0–17.0)
MCH: 29.3 pg (ref 26.0–34.0)
MCHC: 29.9 g/dL — ABNORMAL LOW (ref 30.0–36.0)
MCV: 98.3 fL (ref 80.0–100.0)
Platelets: 226 10*3/uL (ref 150–400)
RBC: 3.51 MIL/uL — ABNORMAL LOW (ref 4.22–5.81)
RDW: 14.9 % (ref 11.5–15.5)
WBC: 4.9 10*3/uL (ref 4.0–10.5)
nRBC: 0 % (ref 0.0–0.2)

## 2019-01-03 LAB — BASIC METABOLIC PANEL
Anion gap: 7 (ref 5–15)
BUN: 23 mg/dL (ref 8–23)
CO2: 25 mmol/L (ref 22–32)
Calcium: 9.6 mg/dL (ref 8.9–10.3)
Chloride: 108 mmol/L (ref 98–111)
Creatinine, Ser: 1.05 mg/dL (ref 0.61–1.24)
GFR calc Af Amer: >60 mL/min (ref >60)
GFR calc non Af Amer: >60 mL/min (ref >60)
Glucose, Bld: 111 mg/dL — ABNORMAL HIGH (ref 70–99)
Potassium: 5 mmol/L (ref 3.5–5.1)
Sodium: 140 mmol/L (ref 135–145)

## 2019-01-03 MED ORDER — SODIUM CHLORIDE 0.9 % IV SOLN
2.0000 g | INTRAVENOUS | Status: DC
  Administered 2019-01-03 – 2019-01-04 (×2): 2 g via INTRAVENOUS
  Filled 2019-01-03 (×2): qty 2

## 2019-01-03 MED ORDER — OXYCODONE HCL 5 MG PO TABS: 5.0000 mg | ORAL_TABLET | ORAL | 0 refills | Status: DC | PRN

## 2019-01-03 MED ORDER — LIDOCAINE HCL 1 % IJ SOLN
INTRAMUSCULAR | Status: AC
  Filled 2019-01-03: qty 20

## 2019-01-03 MED ORDER — POLYETHYLENE GLYCOL 3350 17 G PO PACK: 17.0000 g | PACK | Freq: Two times a day (BID) | ORAL | 0 refills | Status: DC

## 2019-01-03 MED ORDER — METHOCARBAMOL 500 MG PO TABS: 500.0000 mg | ORAL_TABLET | Freq: Four times a day (QID) | ORAL | 0 refills | Status: DC | PRN

## 2019-01-03 MED ORDER — VANCOMYCIN IV (FOR PTA / DISCHARGE USE ONLY): 1250.0000 mg | Freq: Two times a day (BID) | INTRAVENOUS | 0 refills | Status: AC

## 2019-01-03 MED ORDER — CEFTRIAXONE IV (FOR PTA / DISCHARGE USE ONLY): 2.0000 g | INTRAVENOUS | 0 refills | Status: AC

## 2019-01-03 MED ORDER — DOCUSATE SODIUM 100 MG PO CAPS: 100.0000 mg | ORAL_CAPSULE | Freq: Two times a day (BID) | ORAL | 0 refills | Status: DC

## 2019-01-03 MED ORDER — FERROUS SULFATE 325 (65 FE) MG PO TABS: 325.0000 mg | ORAL_TABLET | Freq: Three times a day (TID) | ORAL | 0 refills | Status: DC

## 2019-01-03 NOTE — TOC Initial Note (Signed)
Transition of Care [facility]) - Initial/Assessment Note    Patient Details  Name: [name] MRN: [id number] Date of Birth: [birth year]  Transition of Care [facility]) CM/SW Contact:    Lia Hopping, [place] Phone Number: 01/03/2019, 4:57 PM  Clinical Narrative:                 CSW met with the patient at bedside to discuss continued IV therapy and medication management at home. Patient reports he has received IV therapy in the past and is very familiar with the process. CSW reached out to [employer] with [facility] to help coordinate patient care for home. She will follow up with the patient.  RN unavailable through [facility] in the [place], [place] area.  [facility] will provide RN, this too will be arranged by [facility] Rep Pam.    Expected Discharge Plan: [place] Barriers to Discharge: Barriers Resolved   Patient Goals and CMS Choice Patient states their goals for this hospitalization and ongoing recovery are:: "Continue IV antibiotics" CMS Medicare.gov Compare Post Acute Care list provided to:: Patient Choice offered to / list presented to : Patient  Expected Discharge Plan and Services Expected Discharge Plan: [place] In-house Referral: Clinical Social Work   Post Acute Care Choice: [place] arrangements for the past 2 months: [place] Expected Discharge Date: 01/03/19                           [facility] Agency: [facility] Date [place]: 01/03/19 Time [place]: [id number] Representative spoke with at [place]: [place] Arrangements/Services Living arrangements for the past 2 months: [place] with:: Self Patient language and need for interpreter reviewed:: No Do you feel safe going back to the place where you live?: Yes      Need for Family Participation in Patient Care: Yes (Comment) Care giver support system in place?: Yes (comment) Current home  services: Home RN Criminal Activity/Legal Involvement Pertinent to Current Situation/Hospitalization: No - Comment as needed  Activities of Daily Living Home Assistive Devices/Equipment: Walker (specify type), CPAP, Cane (specify quad or straight), Crutches, Eyeglasses, Built-in shower seat, Grab bars in shower ADL Screening (condition at time of admission) Patient's cognitive ability adequate to safely complete daily activities?: Yes Is the patient deaf or have difficulty hearing?: No Does the patient have difficulty seeing, even when wearing glasses/contacts?: No Does the patient have difficulty concentrating, remembering, or making decisions?: No Patient able to express need for assistance with ADLs?: Yes Does the patient have difficulty dressing or bathing?: No Independently performs ADLs?: Yes (appropriate for developmental age) Does the patient have difficulty walking or climbing stairs?: Yes Weakness of Legs: Left Weakness of Arms/Hands: None  Permission Sought/Granted Permission sought to share information with : Other (comment)([facility]) Permission granted to share information with : Yes, Verbal Permission Granted              Emotional Assessment Appearance:: Appears stated age Attitude/Demeanor/Rapport: Engaged Affect (typically observed): Accepting, Calm Orientation: : Oriented to Self, Oriented to Place, Oriented to  Time, Oriented to Situation Alcohol / Substance Use: Not Applicable Psych Involvement: No (comment)  Admission diagnosis:  Infected left total knee Patient Active Problem List   Diagnosis Date Noted  . Administration of long-term prophylactic antibiotics   . History of streptococcal infection   . History of DVT (deep vein thrombosis)   .  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 S/P rev left TK 12/15/2015  . 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   . Preoperative cardiovascular examination 04/17/2013  . OSA on CPAP 11/27/2012  . Chronic diastolic heart failure ([place]) 09/10/2012  . Chronic anticoagulation, with Xarelto 09/10/2012  . DVT (deep venous thrombosis), possible 09/10/2012  . SOB (shortness of breath) 08/26/2012  . Chest discomfort 08/26/2012  . Persistent atrial fibrillation ([place]) 08/26/2012  . Super obesity 06/27/2012  . Expected blood loss anemia 06/27/2012  . S/P left TK revision 06/23/2012  . Septic arthritis of knee ([place])   . Cellulitis 09/19/2010  . METHICILLIN RESISTANT STAPHYLOCOCCUS AUREUS INFECTION 09/10/2009  . 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 DM 07/04/2008  . CHRONIC KIDNEY DISEASE UNSPECIFIED 07/04/2008  . PULMONARY EMBOLISM, HX OF 07/04/2008  . INFECTION DUE TO INTERNAL ORTH DEVICE NEC 03/02/2006   PCP:  Bernerd Limbo, MD Pharmacy:   [facility], [address] [place] [address] Phone: [phone] Fax: [phone]     Social Determinants of Health (SDOH) Interventions    Readmission Risk Interventions No flowsheet data found.

## 2019-01-03 NOTE — Evaluation (Signed)
Physical Therapy Evaluation Patient Details Name: [name] MRN: [id number] DOB: 1950/07/14 Today's Date: 01/03/2019   History of Present Illness  Pt s/p second L knee I&D with antibiotic spacer placement.  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  Clinical Impression  Pt admitted as above and presenting with functional mobility limitations 2* decreased L LE strength/ROM, post op pain and PWB on L LE.  Pt should progress to dc home with family assist.    Follow Up Recommendations Follow surgeon's recommendation for DC plan and follow-up therapies    Equipment Recommendations  None recommended by PT    Recommendations for Other Services       Precautions / Restrictions Precautions Precautions: Knee;Fall Restrictions Weight Bearing Restrictions: Yes LLE Weight Bearing: Partial weight bearing LLE Partial Weight Bearing Percentage or Pounds: 25%      Mobility  Bed Mobility Overal bed mobility: Needs Assistance Bed Mobility: Supine to Sit     Supine to sit: Min guard     General bed mobility comments: Pt self assisting L LE with belt and utilizing bed rails  Transfers Overall transfer level: Needs assistance Equipment used: Rolling walker (2 wheeled) Transfers: Sit to/from Stand Sit to Stand: Min guard         General transfer comment: min cues for use of UEs to self assist  Ambulation/Gait Ambulation/Gait assistance: Min guard Gait Distance (Feet): 90 Feet Assistive device: Rolling walker (2 wheeled) Gait Pattern/deviations: Step-to pattern;Step-through pattern;Decreased step length - right;Decreased step length - left;Shuffle;Trunk flexed Gait velocity: decr   General Gait Details: cues for posture, position from RW and PWB  Stairs            Wheelchair Mobility    Modified Rankin (Stroke Patients Only)       Balance Overall balance assessment: Mild deficits observed, not formally tested                                            Pertinent Vitals/Pain Pain Assessment: 0-10 Pain Score: 5  Pain Location: L knee Pain Descriptors / Indicators: Aching;Sore Pain Intervention(s): Limited activity within patient's tolerance;Monitored during session;Premedicated before session;Ice applied    Home Living Family/patient expects to be discharged to:: Private residence Living Arrangements: Alone   Type of Home: House Home Access: Stairs to enter Entrance Stairs-Rails: Right Entrance Stairs-Number of Steps: 2.5 Home Layout: One level Home Equipment: [place] - 4 wheels;Walker - 2 wheels      Prior Function Level of Independence: Independent with assistive device(s)         Comments: using RW since L TKR resection     Hand Dominance        Extremity/Trunk Assessment   Upper Extremity Assessment Upper Extremity Assessment: Overall WFL for tasks assessed    Lower Extremity Assessment Lower Extremity Assessment: LLE deficits/detail    Cervical / Trunk Assessment Cervical / Trunk Assessment: Normal  Communication   Communication: No difficulties  Cognition Arousal/Alertness: Awake/alert Behavior During Therapy: WFL for tasks assessed/performed Overall Cognitive Status: Within Functional Limits for tasks assessed                                        General Comments      Exercises  Assessment/Plan    PT Assessment Patient needs continued PT services  PT Problem List Decreased strength;Decreased range of motion;Decreased activity tolerance;Decreased balance;Decreased mobility;Pain;Decreased knowledge of use of DME;Obesity       PT Treatment Interventions DME instruction;Gait training;Stair training;Functional mobility training;Therapeutic activities;Therapeutic exercise;Patient/family education    PT Goals (Current goals can be found in the Care Plan section)  Acute Rehab PT Goals Patient Stated Goal: Get a new knee eventually and regain  IND PT Goal Formulation: With patient Time For Goal Achievement: 01/10/19 Potential to Achieve Goals: Good    Frequency 7X/week   Barriers to discharge        Co-evaluation               AM-PAC PT "6 Clicks" Mobility  Outcome Measure Help needed turning from your back to your side while in a flat bed without using bedrails?: A Little Help needed moving from lying on your back to sitting on the side of a flat bed without using bedrails?: A Little Help needed moving to and from a bed to a chair (including a wheelchair)?: A Little Help needed standing up from a chair using your arms (e.g., wheelchair or bedside chair)?: A Little Help needed to walk in hospital room?: A Little Help needed climbing 3-5 steps with a railing? : A Little 6 Click Score: 18    End of Session Equipment Utilized During Treatment: Gait belt Activity Tolerance: Patient tolerated treatment well Patient left: in chair;with call bell/phone within reach;with chair alarm set Nurse Communication: Mobility status PT Visit Diagnosis: Difficulty in walking, not elsewhere classified (R26.2)    Time: 1130-1150 PT Time Calculation (min) (ACUTE ONLY): 20 min   Charges:   PT Evaluation $PT Eval Low Complexity: 1 Low          [place] Pager [phone] Office [phone]   Neomi Laidler 01/03/2019, 5:22 PM

## 2019-01-03 NOTE — Plan of Care (Signed)
  Problem: Clinical Measurements: Goal: Respiratory complications will improve Outcome: Progressing Goal: Cardiovascular complication will be avoided Outcome: Progressing   Problem: Activity: Goal: Risk for activity intolerance will decrease Outcome: Progressing   Problem: Coping: Goal: Level of anxiety will decrease Outcome: Progressing   Problem: Pain Managment: Goal: General experience of comfort will improve Outcome: Progressing   Problem: Safety: Goal: Ability to remain free from injury will improve Outcome: Progressing   

## 2019-01-03 NOTE — Plan of Care (Signed)
  Problem: Health Behavior/Discharge Planning: Goal: Ability to manage health-related needs will improve Outcome: Progressing   Problem: Clinical Measurements: Goal: Ability to maintain clinical measurements within normal limits will improve Outcome: Progressing   Problem: Clinical Measurements: Goal: Will remain free from infection Outcome: Progressing   Problem: Clinical Measurements: Goal: Respiratory complications will improve Outcome: Progressing   Problem: Clinical Measurements: Goal: Cardiovascular complication will be avoided Outcome: Progressing   

## 2019-01-03 NOTE — Progress Notes (Addendum)
     Subjective: 1 Day Post-Op Procedure(s) (LRB): Repeat Washout and placement of antibiotic spacer left knee (Left)   Patient reports pain as moderate, pain controlled.  Patient had some incident of shaking yesterday, possibly related to pain.  This resolved throughout the night and is better today.  Failed attempt at [facility] line yesterday and will have to order interventional radiology to perform this when able.  We will place consult for infectious disease to see the patient help with treatment of the infection.     Objective:   VITALS:   Vitals:   01/03/19 0502 01/03/19 0915  BP: 108/62 109/62  Pulse: 86 81  Resp: 20 16  Temp: 98.4 F (36.9 C) 98.7 F (37.1 C)  SpO2: 91% 95%    Dorsiflexion/Plantar flexion intact Incision: dressing C/D/I  LABS Recent Labs    01/03/19 0319  HGB 10.3*  HCT 34.5*  WBC 4.9  PLT 226    Recent Labs    01/03/19 0319  NA 140  K 5.0  BUN 23  CREATININE 1.05  GLUCOSE 111*     Assessment/Plan: 1 Day Post-Op Procedure(s) (LRB): Repeat Washout and placement of antibiotic spacer left knee (Left) ID consult placed Ordered IR PICC line placement Advance diet Up with therapy D/C IV fluids Discharge home with home health eventually, when ready   [place]. Perry Brucato   PAC  01/03/2019, 9:17 AM

## 2019-01-03 NOTE — Progress Notes (Signed)
PHARMACY CONSULT NOTE FOR:  OUTPATIENT  PARENTERAL ANTIBIOTIC THERAPY (OPAT)  Indication: Prosthetic joint infection Regimen: Vancomycin 1250mg  IV q12, Rocephin 2gm q24 End date: 02/15/2019  IV antibiotic discharge orders are pended. To discharging provider:  please sign these orders via discharge navigator,  Select New Orders & click on the button choice - Manage This Unsigned Work.     Thank you for allowing pharmacy to be a part of this patient's care.  Minda Ditto PharmD 01/03/2019, 12:52 PM

## 2019-01-03 NOTE — Procedures (Signed)
Pre procedural Diagnosis: Poor venous access Post Procedural Diagnosis: Same  Successful placement of right brachial vein approach single lumen PICC line with tip at the superior caval-atrial junction.    EBL: None  No immediate post procedural complication.  The PICC line is ready for immediate use.  Ronny Bacon, MD Pager #: [phone]

## 2019-01-03 NOTE — Progress Notes (Signed)
      INFECTIOUS DISEASE ATTENDING ADDENDUM:   Date: 01/03/2019  Patient name: [name]  Medical record number: [id number]  Date of birth: [birth year]   Diagnosis: PJI  Culture Result: Pending  Allergies  Allergen Reactions  . Morphine Hives    OPAT Orders TENTATIVE Discharge antibiotics:    Vancomycin per pharmacy protocol  Aim for Vancomycin trough 15-20 (unless otherwise indicated)   Duration:  6 weeks End Date:  January 6th, 2020  [place] Per Protocol:  Labs BI- weekly while on IV antibiotics:  _x_ BMP w GFR _x_ Vancomycin trough  Labs  weekly while on IV antibiotics: x__ CBC with differential  x__ CRP x__ ESR    __ Please pull PIC at completion of IV antibiotics _x_ Please leave PIC in place until doctor has seen patient or been notified  Fax weekly labs to [phone]  Clinic Follow Up Appt:   [name] has an appointment on January 4th at 245 with Dr. Tommy [name].  The [facility] for Infectious Disease is located in the [facility] at  [address] in [place].  [address], which is located to the left of the elevators.  Phone: [phone]  Fax: [phone]  [email or web address]  He should arrive 15 minutes prior to the appt time.

## 2019-01-03 NOTE — Consult Note (Signed)
Date of Admission:  01/02/2019          Reason for Consult: recurrent pJI    Referring Provider: Dr. Alvan Dame   Assessment:  1. 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 Retained femoral component of PJ 3. Morbid obesity  Plan:  1. Continue vancomycin and add  back ceftriaxone 2. Follow-up culture results 3. I would plan on retreating with 6 weeks of IV antibiotics now followed by oral antibiotics again 4. 1 could consider the addition of rifampin particular if we find a staphylococcal species.  Would note that he had a group C strep grow remotely 5. I will put in tentative O. Pat orders for vancomycin and ceftriaxone x6 weeks 6. I will reschedule him for a clinic appointment and have reminded him that he can also conduct the visit via telephone if he is unable to make it in person.  He says he will not build to do a video visit.  Dr. Linus Salmons will be covering this weekend and is available for questions and will adjust antibiotics as needed.  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 celecoxib  200 mg Oral BID  . diltiazem  240 mg Oral Daily  . docusate sodium  100 mg Oral BID  . ferrous sulfate  325 mg Oral BID WC  . levothyroxine  125 mcg Oral QAC breakfast  . metoprolol tartrate  50 mg Oral BID  . polyethylene glycol  17 g Oral BID  . rivaroxaban  10 mg Oral Q24H   Continuous Infusions: . sodium chloride 75 mL/hr at 01/03/19 0609  . cefTRIAXone (ROCEPHIN)  IV    . methocarbamol (ROBAXIN) IV Stopped (01/02/19 1719)  . vancomycin 1,250 mg (01/03/19 0954)   PRN Meds:.acetaminophen, alum & mag hydroxide-simeth, bisacodyl, diphenhydrAMINE, fentaNYL (SUBLIMAZE) injection, magnesium citrate, menthol-cetylpyridinium **OR** phenol, methocarbamol **OR** methocarbamol (ROBAXIN) IV,  metoCLOPramide **OR** metoCLOPramide (REGLAN) injection, ondansetron **OR** ondansetron (ZOFRAN) IV, oxyCODONE, oxyCODONE  HPI: [name] is a 68 y.o. male  y.o. male well-known to me over the past TEN YEARS.  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  He had done well when I last saw him in clinic and been off antibiotics for 4 years after implantation of a new prosthetic knee.   Unfortunately he developed recurrence/vs new  infection which declared itself in August of 2020.   resection of the tibial component and insertion of antibiotic spacer.  Cultures remained negative and were uninformative.  He was sent home with 6 weeks of IV vancomycin and ceftriaxone which he completed.  He had a follow-up visit with me in the clinic but felt too weak to be able to make it.  He then was off antibiotics in the interim but was continue to have increasing pain in the knee persistently elevated inflammatory markers and follow-up with Dr. Alvan Dame.  Dr Alvan Dame noted that with surgery in August he had left the femoral component in place due to concerns re morbidity of removing it with bone loss.   He took [name] back to the operating room yesterday and performed I&D including debridement of extensive scar tissue.  He did encounter mercury all synovial blood-tinged fluid underneath the  median arthrotomy area.  This has been sent for culture.  The tibial polyethylene was removed along with the cement. He described the bone in the tibia as essentially a cortical shell.Marland Kitchen He debrided down into the canal and opened this up.  And antibiotic spacer was placed.   Review of Systems: ROS  Past Medical History:  Diagnosis Date  . CHF (congestive heart failure) ([place])   . Chronic anticoagulation    with Xarelto  . Complication of anesthesia    PT STATES HARD TO WAKE UP AFTER ONE  SUGERY -STATES THE SURGERY TOOK LONGER THAN EXPECTED.  NO PROBLEMS WITH ANY OTHER SURGERY  . Dysrhythmia    A-fib  . GERD (gastroesophageal reflux disease)   . Headache   . History of blood clots   . History of blood transfusion   . Hypothyroidism   . Pain    BACK PAIN - PT ATTRIBUTES TO THE WAY HE WALKS DUE TO LEFT KNEE PROBLEM  . Persistent atrial fibrillation ([place])   . Septic arthritis of knee ([facility])    LEFT KNEE  . Shortness of breath    WITH EXERTION AND PAIN  . Sleep apnea    uses CPAP,    Social History   Tobacco Use  . Smoking status: Never Smoker  . Smokeless tobacco: Never Used  Substance Use Topics  . Alcohol use: Yes    Comment: 1-2 drinks per month , 09-15-2018 "even less than that now "  . Drug use: No    Family History  Problem Relation Age of Onset  . Heart disease Father 35  . Pancreatic cancer Sister   . Liver cancer Sister   . Cervical cancer Sister   . Breast cancer Sister   . Diabetes Sister   . Colon cancer Neg Hx   . Throat cancer Neg Hx   . Stomach cancer Neg Hx   . Kidney disease Neg Hx   . Liver disease Neg Hx    Allergies  Allergen Reactions  . Morphine Hives    OBJECTIVE: Blood pressure 109/62, pulse 81, temperature 98.7 F (37.1 C), temperature source Oral, resp. rate 16, height 5\' 8"  (1.727 m), weight (!) 171 kg, SpO2 95 %.  Physical Exam  Lab Results Lab Results  Component Value Date   WBC 4.9 01/03/2019   HGB 10.3 (L) 01/03/2019   HCT 34.5 (L) 01/03/2019   MCV 98.3 01/03/2019   PLT 226 01/03/2019    Lab Results  Component Value Date   CREATININE 1.05 01/03/2019   BUN 23 01/03/2019   NA 140 01/03/2019   K 5.0 01/03/2019   CL 108 01/03/2019   CO2 25 01/03/2019    Lab Results  Component Value Date   ALT 10 01/25/2013   AST 13 01/25/2013   ALKPHOS 72 01/25/2013   BILITOT 0.4 01/25/2013     Microbiology: Recent Results (from the past 240 hour(s))  Surgical pcr screen     Status: None   Collection Time:  12/29/18 11:47 AM   Specimen: Vein; Nasal Swab  Result Value Ref Range Status   MRSA, PCR NEGATIVE NEGATIVE Final   Staphylococcus aureus NEGATIVE NEGATIVE Final    Comment: (NOTE) The Xpert SA Assay (FDA approved for NASAL specimens in patients 79 years of age and older), is one component of a comprehensive surveillance program. It is not intended to diagnose infection nor to guide or monitor treatment. Performed at [facility], [place] [address] [place], [place] [address]  Novel Coronavirus, NAA (Hosp order, Send-out to Ref Lab; TAT 18-24 hrs     Status: None   Collection Time: 12/29/18 12:22 PM   Specimen: Nasopharyngeal Swab; Respiratory  Result Value Ref Range Status   SARS-CoV-2, NAA NOT DETECTED NOT DETECTED Final    Comment: (NOTE) This nucleic acid amplification test was developed and its performance characteristics determined by [employer]. Nucleic acid amplification tests include PCR and TMA. This test has not been FDA cleared or approved. This test has been authorized by FDA under an Emergency Use Authorization (EUA). This test is only authorized for the duration of time the declaration that circumstances exist justifying the authorization of the emergency use of in vitro diagnostic tests for detection of SARS-CoV-2 virus and/or diagnosis of COVID-19 infection under section 564(b)(1) of the Act, 21 U.S.C. GF:7541899) (1), unless the authorization is terminated or revoked sooner. When diagnostic testing is negative, the possibility of a false negative result should be considered in the context of a patient's recent exposures and the presence of clinical signs and symptoms consistent with COVID-19. An individual without symptoms of COVID- 19 and who is not shedding SARS-CoV-2 vi rus would expect to have a negative (not detected) result in this assay. Performed At: [facility] [address] [place], [place] [id number] Rush Farmer MD [id number]    [place]  Final    Comment: Performed at [facility], [place] [address], [place], [place] [address]  Anaerobic culture     Status: None (Preliminary result)   Collection Time: 01/02/19  2:00 PM   Specimen: PATH Cytology Misc. fluid; Body Fluid  Result Value Ref Range Status   Specimen Description   Final    SYNOVIAL LT KNEE Performed at [place] [address], [place], [place] [address]    Special Requests   Final    PATIENT ON FOLLOWING VANC/ANCEF SAMPLE IN OR REFRIG FRM11/24/20 Performed at [facility], [place] [address] [place], [place] [address]    Gram Stain PENDING  Incomplete   Culture PENDING  Incomplete   Report Status PENDING  Incomplete    Alcide Evener, [place] for Infectious [place] [phone] pager  01/03/2019, 12:16 PM

## 2019-01-03 NOTE — Progress Notes (Signed)
Patient is uncontrollably shivering and states he is freezing. Oral temp was 98.5. Patient's HR 166 bpm on Dinamap. Pt's BP 156/127 on the Dinamap. BP 129/78 manual. Rn auscultated apical pulse it was 72 bpm.  Patient's RR 24. Patient not complaining of pain just concerned about the shivering. Patient denies chest pain and difficulty breathing. Rn assessed pedal pulses and posterior tibialis pulses bilaterally, palpable, +2. Feet are warm to the touch. Patient denies numbness and tingling in his lower extremities. Patient has a history of Afib.   RN notified RRRN of the MEWS 4 (red) and informed her of the above information. RRRN arrived on the unit to assess the patient. EKG performed and rectal temp checked.   RN will notify RRRN of any further needs.   RN gave 66mcg Fentanyl as ordered prn for severe pain at 2041. Patient c/o calf pain and that his toes feel cold. RN assessed patient's bilateral lower extremities and completed another neuro check. Patient also complains of mild numbness and tingling in toes on LLE, patient positive homans sign, + 2 pedal pulses and posterior tibialis pulses. RN notified Jenetta Loges, [place]. at [phone] Regarding all of the information above.   Orders received: give PO pain medicine, Modify prn oxycodone order to q3h, RN to continue to monitor.

## 2019-01-04 DIAGNOSIS — Z89529 Acquired absence of unspecified knee: Secondary | ICD-10-CM

## 2019-01-04 LAB — CBC
HCT: 33.6 % — ABNORMAL LOW (ref 39.0–52.0)
Hemoglobin: 10.2 g/dL — ABNORMAL LOW (ref 13.0–17.0)
MCH: 29.1 pg (ref 26.0–34.0)
MCHC: 30.4 g/dL (ref 30.0–36.0)
MCV: 96 fL (ref 80.0–100.0)
Platelets: 211 10*3/uL (ref 150–400)
RBC: 3.5 MIL/uL — ABNORMAL LOW (ref 4.22–5.81)
RDW: 14.9 % (ref 11.5–15.5)
WBC: 6.2 10*3/uL (ref 4.0–10.5)
nRBC: 0 % (ref 0.0–0.2)

## 2019-01-04 LAB — BASIC METABOLIC PANEL
Anion gap: 10 (ref 5–15)
BUN: 17 mg/dL (ref 8–23)
CO2: 21 mmol/L — ABNORMAL LOW (ref 22–32)
Calcium: 9.8 mg/dL (ref 8.9–10.3)
Chloride: 105 mmol/L (ref 98–111)
Creatinine, Ser: 0.79 mg/dL (ref 0.61–1.24)
GFR calc Af Amer: >60 mL/min (ref >60)
GFR calc non Af Amer: >60 mL/min (ref >60)
Glucose, Bld: 157 mg/dL — ABNORMAL HIGH (ref 70–99)
Potassium: 4.1 mmol/L (ref 3.5–5.1)
Sodium: 136 mmol/L (ref 135–145)

## 2019-01-04 LAB — C-REACTIVE PROTEIN: CRP: 17 mg/dL — ABNORMAL HIGH (ref <1.0)

## 2019-01-04 LAB — SEDIMENTATION RATE: Sed Rate: 16 mm/hr (ref 0–16)

## 2019-01-04 MED ORDER — HEPARIN SOD (PORK) LOCK FLUSH 100 UNIT/ML IV SOLN
250.0000 [IU] | INTRAVENOUS | Status: AC | PRN
  Administered 2019-01-04: 250 [IU]

## 2019-01-04 NOTE — Discharge Summary (Signed)
. Physician Discharge Summary  Patient ID: [name] MRN: [id number] DOB/AGE: Oct 30, 1950 68 y.o.  Admit date: 01/02/2019 Discharge date: 01/04/19  Admission Diagnoses: Infected left total knee Past Medical History:  Diagnosis Date  . CHF (congestive heart failure) ([place])   . Chronic anticoagulation    with Xarelto  . Complication of anesthesia    PT STATES HARD TO WAKE UP AFTER ONE SUGERY -STATES THE SURGERY TOOK LONGER THAN EXPECTED.  NO PROBLEMS WITH ANY OTHER SURGERY  . Dysrhythmia    A-fib  . GERD (gastroesophageal reflux disease)   . Headache   . History of blood clots   . History of blood transfusion   . Hypothyroidism   . Pain    BACK PAIN - PT ATTRIBUTES TO THE WAY HE WALKS DUE TO LEFT KNEE PROBLEM  . Persistent atrial fibrillation ([place])   . Septic arthritis of knee (H[facility])    LEFT KNEE  . Shortness of breath    WITH EXERTION AND PAIN  . 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 male who was admitted 01/02/2019 with a chief complaint of No chief complaint on file. , and found to have a diagnosis of Infected left total knee.  They were brought to the operating room on 01/02/2019 and underwent Procedure(s): Repeat Washout and placement of antibiotic spacer left knee.    They were given perioperative antibiotics:  Anti-infectives (From admission, onward)   Start     Dose/Rate Route Frequency Ordered Stop   01/03/19 1400  cefTRIAXone (ROCEPHIN) 2 g in sodium chloride 0.9 % 100 mL IVPB     2 g 200 mL/hr over 30 Minutes Intravenous Every 24 hours 01/03/19 1216     01/03/19 0000  cefTRIAXone (ROCEPHIN)  IVPB     2 g Intravenous Every 24 hours 01/03/19 1555 02/15/19 2359   01/03/19 0000  vancomycin IVPB     1,250 mg Intravenous Every 12 hours 01/03/19 1555 02/15/19 2359   01/02/19 2100  vancomycin (VANCOCIN) 1,250 mg in sodium chloride 0.9 % 250 mL IVPB     1,250 mg 166.7 mL/hr over 90 Minutes Intravenous Every 12 hours 01/02/19 1710     01/02/19 2000  ceFAZolin (ANCEF) IVPB 2g/100 mL premix     2 g 200 mL/hr over 30 Minutes Intravenous Every 6 hours 01/02/19 1810 01/03/19 0349   01/02/19 1529  vancomycin (VANCOCIN) powder  Status:  Discontinued       As needed 01/02/19 1529 01/02/19 1751   01/02/19 1528  tobramycin (NEBCIN) powder  Status:  Discontinued       As needed 01/02/19 1529 01/02/19 1751   01/02/19 0600  ceFAZolin (ANCEF) 3 g in dextrose 5 % 50 mL IVPB     3 g 100 mL/hr over 30 Minutes Intravenous On call to O.R. 01/01/19 0240 01/02/19 1432    .  They were given sequential compression devices, early ambulation, and Other (comment) ambulation for DVT prophylaxis.  Recent vital signs:  Patient Vitals for the past 24 hrs:  BP Temp Temp src Pulse Resp SpO2  01/04/19 0539 (!) 137/97 (!) 97.5 F (36.4 C) Oral 64 14 96 %  01/03/19 2101 125/80 98.4 F (36.9 C) Oral 76 18 94 %  01/03/19 1941 - - - 78 18 95 %  01/03/19 1257 104/70 98.4 F (36.9 C) Oral 79 16 96 %  .  Recent laboratory studies: Ir Picc Placement Right >5 Yrs Inc Img Guide  Result Date: 01/03/2019 INDICATION: Poor venous access. In need of durable intravenous access for medication administration. EXAM: ULTRASOUND AND FLUOROSCOPIC GUIDED PICC LINE INSERTION MEDICATIONS: None. CONTRAST:  None FLUOROSCOPY TIME:  24 seconds (20 mGy) COMPLICATIONS: None immediate. TECHNIQUE: The procedure, risks, benefits, and alternatives were explained to the patient and informed written consent was obtained. A timeout was performed prior to the initiation of the procedure. The right upper extremity was prepped with chlorhexidine in a  sterile fashion, and a sterile drape was applied covering the operative field. Maximum barrier sterile technique with sterile gowns and gloves were used for the procedure. A timeout was performed prior to the initiation of the procedure. Local anesthesia was provided with 1% lidocaine. Under direct ultrasound guidance, the brachial vein was accessed with a micropuncture kit after the overlying soft tissues were anesthetized with 1% lidocaine. After the overlying soft tissues were anesthetized, a small venotomy incision was created and a micropuncture kit was utilized to access the right brachial vein. Real-time ultrasound guidance was utilized for vascular access including the acquisition of a permanent ultrasound image documenting patency of the accessed vessel. A guidewire was advanced to the level of the superior caval-atrial junction for measurement purposes and the PICC line was cut to length. A peel-away sheath was placed and a 46 cm, 5 [place], single lumen was inserted to level of the superior caval-atrial junction. A post procedure spot fluoroscopic was obtained. The catheter easily aspirated and flushed and was sutured in place. A dressing was placed. The patient tolerated the procedure well without immediate post procedural complication. FINDINGS: After catheter placement, the tip lies within the superior cavoatrial junction. The catheter aspirates and flushes normally and is ready for immediate use. IMPRESSION: Successful ultrasound and fluoroscopic guided placement of a right brachial vein approach, 46 cm, 5 French, single lumen PICC with tip at the superior caval-atrial junction. The PICC line is ready for immediate use. Electronically Signed   By: Sandi Mariscal M.D.   On: 01/03/2019 15:16   [place] Ekg Site Rite  Result Date: 01/02/2019 If Site Rite image not attached, placement could not be confirmed due to current cardiac rhythm.   Discharge Medications:   Allergies as of 01/04/2019      Reactions    Morphine Hives      Medication List    STOP taking these medications   furosemide 20 MG tablet Commonly known as: LASIX   HYDROcodone-acetaminophen 7.5-325 MG tablet Commonly known as: Norco     TAKE these medications   cefTRIAXone  IVPB Commonly known as: ROCEPHIN Inject 2 g into the vein daily. Indication:  Prosthetic joint infection Last Day of Therapy:  02/15/2019 Labs - Once weekly:  CBC/D and BMP, CRP, ESR Labs - Every other week: BMET, GFR   diltiazem 240 MG 24 hr capsule Commonly known as: CARDIZEM CD TAKE 1 CAPSULE BY MOUTH DAILY   docusate sodium 100 MG capsule Commonly known as: Colace Take 1 capsule (100 mg total) by mouth 2 (two) times daily.   ferrous sulfate 325 (65 FE) MG tablet Commonly known as: FerrouSul Take 1 tablet (325 mg total) by mouth 3 (three) times daily with meals for 14 days.  levothyroxine 125 MCG tablet Commonly known as: SYNTHROID Take 125 mcg by mouth daily before breakfast.   methocarbamol 500 MG tablet Commonly known as: Robaxin Take 1 tablet (500 mg total) by mouth every 6 (six) hours as needed for muscle spasms.   metoprolol tartrate 50 MG tablet Commonly known as: LOPRESSOR TAKE 1 TABLET BY MOUTH TWICE DAILY   multivitamin with minerals Tabs tablet Take 1 tablet by mouth daily.   oxyCODONE 5 MG immediate release tablet Commonly known as: Oxy IR/ROXICODONE Take 1-2 tablets (5-10 mg total) by mouth every 4 (four) hours as needed for moderate pain or severe pain.   polyethylene glycol 17 g packet Commonly known as: MIRALAX / GLYCOLAX Take 17 g by mouth 2 (two) times daily.   rivaroxaban 20 MG Tabs tablet Commonly known as: Xarelto Take 1 tablet (20 mg total) by mouth daily with breakfast.   vancomycin  IVPB Inject 1,250 mg into the vein every 12 (twelve) hours. Indication:  Prosthetic joint infection Last Day of Therapy:  02/15/2019 Labs - _0 /25/20 1556          Diagnostic Studies: Ir Picc Placement Right >5 Yrs Inc Img Guide  Result Date: 01/03/2019 INDICATION: Poor venous access. In need of durable intravenous access for medication administration. EXAM: ULTRASOUND AND FLUOROSCOPIC GUIDED PICC LINE INSERTION MEDICATIONS: None. CONTRAST:  None FLUOROSCOPY TIME:  24 seconds (20 mGy) COMPLICATIONS: None immediate. TECHNIQUE: The procedure, risks, benefits, and alternatives were explained to the patient and informed written consent was obtained. A timeout was performed  prior to the initiation of the procedure. The right upper extremity was prepped with chlorhexidine in a sterile fashion, and a sterile drape was applied covering the operative field. Maximum barrier sterile technique with sterile gowns and gloves were used for the procedure. A timeout was performed prior to the initiation of the procedure.  Local anesthesia was provided with 1% lidocaine. Under direct ultrasound guidance, the brachial vein was accessed with a micropuncture kit after the overlying soft tissues were anesthetized with 1% lidocaine. After the overlying soft tissues were anesthetized, a small venotomy incision was created and a micropuncture kit was utilized to access the right brachial vein. Real-time ultrasound guidance was utilized for vascular access including the acquisition of a permanent ultrasound image documenting patency of the accessed vessel. A guidewire was advanced to the level of the superior caval-atrial junction for measurement purposes and the PICC line was cut to length. A peel-away sheath was placed and a 46 cm, 5 [place], single lumen was inserted to level of the superior caval-atrial junction. A post procedure spot fluoroscopic was obtained. The catheter easily aspirated and flushed and was sutured in place. A dressing was placed. The patient tolerated the procedure well without immediate post procedural complication. FINDINGS: After catheter placement, the tip lies within the superior cavoatrial junction. The catheter aspirates and flushes normally and is ready for immediate use. IMPRESSION: Successful ultrasound and fluoroscopic guided placement of a right brachial vein approach, 46 cm, 5 French, single lumen PICC with tip at the superior caval-atrial junction. The PICC line is ready for immediate use. Electronically Signed   By: Sandi Mariscal M.D.   On: 01/03/2019 15:16   [place] Ekg Site Rite  Result Date: 01/02/2019 If Site Rite image not attached, placement could not be confirmed  due to current cardiac rhythm.   They benefited maximally from their hospital stay and there were no complications.     Disposition: Discharge disposition: 01-Home or Self Care      Discharge Instructions    Call MD / Call 911   Complete by: As directed    If you experience chest pain or shortness of breath, CALL 911 and be transported to the hospital emergency room.  If you develope a fever above 101 F, pus (white drainage) or increased drainage or redness at the wound, or calf pain, call your surgeon's office.   Constipation Prevention   Complete by: As directed    Drink plenty of fluids.  Prune juice may be helpful.  You may use a stool softener, such as Colace (over the counter) 100 mg twice a day.  Use MiraLax (over the counter) for constipation as needed.   Diet - low sodium heart healthy   Complete by: As directed    Discharge instructions   Complete by: As directed    Maintain surgical dressing until follow up in the clinic. If the edges start to pull up, may reinforce with tape. If the dressing is no longer working, may remove and cover with gauze and tape, but must keep the area dry and clean.  Follow up in 2 weeks at [facility]. Call with any questions or concerns.   Home infusion instructions [facility] May follow [facility] Dosing Protocol; May administer Cathflo as needed to maintain patency of vascular access device.; Flushing of vascular access device: per [facility] Protocol: 0.9% NaCl pre/post medica...   Complete by: As directed    Instructions: May follow [place] Dosing Protocol   Instructions: May administer Cathflo as needed to maintain patency of vascular access device.   Instructions: Flushing of vascular access device: per [facility] Protocol: 0.9% NaCl pre/post medication administration and prn patency; Heparin 100 u/ml, 28m for implanted ports and Heparin 10u/ml, 554mfor all other central venous catheters.   Instructions: May follow [facility]  Anaphylaxis  Protocol for First Dose Administration in the home: 0.9% NaCl at 25-50 ml/hr to maintain IV access for protocol meds. Epinephrine 0.3 ml IV/IM PRN and Benadryl 25-50 IV/IM PRN s/s of anaphylaxis.   Instructions: [name] Coordinator (RN) to assist per patient IV care needs in the home PRN.   Partial weight bearing   Complete by: As directed    % Body Weight: 25   Laterality: left   Extremity: Lower     Follow-up Information    Paralee Cancel, MD. Schedule an appointment as soon as possible for a visit in 2 week(s).   Specialty: Orthopedic Surgery Contact information: [address] [place] [address] [phone]            Signed: Brynda Peon 01/04/2019, 12:21 PM

## 2019-01-04 NOTE — Discharge Instructions (Signed)
KEEP BANDAGE CLEAN AND DRY CALL OFFICE FOR F/U APPT [phone] OK TO APPLY ICE TO OPERATIVE AREA CONTACT OFFICE IF ANY WORSENING PAIN OR CONCERNS.

## 2019-01-04 NOTE — Progress Notes (Signed)
Physical Therapy Treatment Patient Details Name: [name] MRN: [id number] DOB: 1950/05/21 Today's Date: 01/04/2019    History of Present Illness Pt s/p second L knee I&D with antibiotic spacer placement.  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    PT Comments    Pt progressing well with mobility and states he is very comfortable with ability to function at home and states he is very familiar with home therex program after 13 surgeries on this knee.   Follow Up Recommendations  Follow surgeon's recommendation for DC plan and follow-up therapies     Equipment Recommendations  None recommended by PT    Recommendations for Other Services       Precautions / Restrictions Precautions Precautions: Knee;Fall Restrictions Weight Bearing Restrictions: Yes LLE Weight Bearing: Partial weight bearing LLE Partial Weight Bearing Percentage or Pounds: 25%    Mobility  Bed Mobility Overal bed mobility: Modified Independent Bed Mobility: Supine to Sit           General bed mobility comments: Pt self assisting L LE with belt and utilizing bed rails  Transfers Overall transfer level: Needs assistance Equipment used: Rolling walker (2 wheeled) Transfers: Sit to/from Stand Sit to Stand: Supervision         General transfer comment: min cues for use of UEs to self assist  Ambulation/Gait Ambulation/Gait assistance: Supervision Gait Distance (Feet): 180 Feet Assistive device: Rolling walker (2 wheeled) Gait Pattern/deviations: Step-to pattern;Step-through pattern;Decreased step length - right;Decreased step length - left;Shuffle;Trunk flexed Gait velocity: decr   General Gait Details: cues for posture, position from RW and PWB   Stairs Stairs: (Pt reports he now has ramp at home)           Wheelchair Mobility    Modified Rankin (Stroke Patients Only)       Balance                                             Cognition Arousal/Alertness: Awake/alert Behavior During Therapy: WFL for tasks assessed/performed Overall Cognitive Status: Within Functional Limits for tasks assessed                                        Exercises      General Comments        Pertinent Vitals/Pain Pain Assessment: 0-10 Pain Score: 4  Pain Location: L knee Pain Descriptors / Indicators: Aching;Sore Pain Intervention(s): Limited activity within patient's tolerance;Monitored during session;Premedicated before session;Ice applied    Home Living                      Prior Function            PT Goals (current goals can now be found in the care plan section) Acute Rehab PT Goals Patient Stated Goal: Get a new knee eventually and regain IND PT Goal Formulation: With patient Time For Goal Achievement: 01/10/19 Potential to Achieve Goals: Good Progress towards PT goals: Progressing toward goals    Frequency    7X/week      PT Plan Current plan remains appropriate    Co-evaluation              AM-PAC PT "6 Clicks" Mobility  Outcome Measure  Help needed turning from your back to your side while in a flat bed without using bedrails?: None Help needed moving from lying on your back to sitting on the side of a flat bed without using bedrails?: None Help needed moving to and from a bed to a chair (including a wheelchair)?: A Little Help needed standing up from a chair using your arms (e.g., wheelchair or bedside chair)?: A Little Help needed to walk in hospital room?: A Little Help needed climbing 3-5 steps with a railing? : A Little 6 Click Score: 20    End of Session Equipment Utilized During Treatment: Gait belt Activity Tolerance: Patient tolerated treatment well Patient left: in chair;with call bell/phone within reach;with chair alarm set Nurse Communication: Mobility status PT Visit Diagnosis: Difficulty in walking, not elsewhere classified (R26.2)      Time: [id number] PT Time Calculation (min) (ACUTE ONLY): 22 min  Charges:  $Gait Training: 8-22 mins                     [place] Pager [phone] Office [phone]    Cheila Wickstrom 01/04/2019, 3:22 PM

## 2019-01-04 NOTE — Progress Notes (Signed)
Subjective: 2 Days Post-Op Procedure(s) (LRB): Repeat Washout and placement of antibiotic spacer left knee (Left) Patient reports pain as 1 on 0-10 scale.   Mr. Sweger is doing well. He feels that his pain is well-controlled.  He has been able to tolerate food and liquid without difficulty.  He has been making progress with therapy.  He is concerned that the antibiotics were sent to the hospital rather than his house.    Objective: Vital signs in last 24 hours: Temp:  [97.5 F (36.4 C)-98.4 F (36.9 C)] 97.5 F (36.4 C) (11/26 0539) Pulse Rate:  [64-79] 64 (11/26 0539) Resp:  [14-18] 14 (11/26 0539) BP: (104-137)/(70-97) 137/97 (11/26 0539) SpO2:  [94 %-96 %] 96 % (11/26 0539)  Intake/Output from previous day: 11/25 0701 - 11/26 0700 In: 2021.7 [P.O.:480; I.V.:741.8; IV Piggyback:600] Out: [id number] [Urine:1725] Intake/Output this shift: Total I/O In: 240 [P.O.:240] Out: -   Recent Labs    01/03/19 0319 01/04/19 0239  HGB 10.3* 10.2*   Recent Labs    01/03/19 0319 01/04/19 0239  WBC 4.9 6.2  RBC 3.51* 3.50*  HCT 34.5* 33.6*  PLT 226 211   Recent Labs    01/03/19 0319 01/04/19 0239  NA 140 136  K 5.0 4.1  CL 108 105  CO2 25 21*  BUN 23 17  CREATININE 1.05 0.79  GLUCOSE 111* 157*  CALCIUM 9.6 9.8   No results for input(s): LABPT, INR in the last 72 hours.  ABD soft Neurovascular intact Sensation intact distally Intact pulses distally Dorsiflexion/Plantar flexion intact Bandage is clean, dry, and intact   Assessment/Plan: 2 Days Post-Op Procedure(s) (LRB): Repeat Washout and placement of antibiotic spacer left knee (Left) Advance diet Continue ABX therapy due to Post-op infection PICC line is in place and antibiotics have been delivered to the hospital Up with therapy.  ID consult is completed. Patient is doing well and would like to go home today. We will await ID to agree that the patient is ready for discharge. We also will have to see if he is  able to get a ride to go home today. If not, then he will likely be discharged tomorrow.  Continue with pain management as needed.     Brynda Peon 01/04/2019, 9:38 AM

## 2019-01-04 NOTE — Progress Notes (Signed)
I reviewed the cultures and they remain ngtd.  He is on vancomycin and ceftriaxone and OPAT order placed.   OK from ID standpoint for discharge.   Will continue to watch the cultures and can adjust as an outpatient, if indicated Thayer Headings, MD

## 2019-01-06 LAB — BODY FLUID CULTURE
Culture: NO GROWTH
Culture: NO GROWTH

## 2019-01-07 LAB — ANAEROBIC CULTURE

## 2019-01-08 ENCOUNTER — Other Ambulatory Visit (HOSPITAL_COMMUNITY)
Admission: RE | Admit: 2019-01-08 | Discharge: 2019-01-08 | Disposition: A | Discharge status: Home / Self Care | Payer: Medicare Other | Source: Ambulatory Visit | Attending: Orthopedic Surgery | Admitting: Orthopedic Surgery

## 2019-01-08 DIAGNOSIS — T8454XA Infection and inflammatory reaction due to internal left knee prosthesis, initial encounter: Secondary | ICD-10-CM | POA: Diagnosis present

## 2019-01-08 DIAGNOSIS — Y999 Unspecified external cause status: Secondary | ICD-10-CM | POA: Diagnosis not present

## 2019-01-08 LAB — BASIC METABOLIC PANEL
Anion gap: 11 (ref 5–15)
BUN: 18 mg/dL (ref 8–23)
CO2: 24 mmol/L (ref 22–32)
Calcium: 9.2 mg/dL (ref 8.9–10.3)
Chloride: 107 mmol/L (ref 98–111)
Creatinine, Ser: 0.89 mg/dL (ref 0.61–1.24)
GFR calc Af Amer: >60 mL/min (ref >60)
GFR calc non Af Amer: >60 mL/min (ref >60)
Glucose, Bld: 132 mg/dL — ABNORMAL HIGH (ref 70–99)
Potassium: 3.9 mmol/L (ref 3.5–5.1)
Sodium: 142 mmol/L (ref 135–145)

## 2019-01-08 LAB — VANCOMYCIN, TROUGH: Vancomycin Tr: 13 ug/mL — ABNORMAL LOW (ref 15–20)

## 2019-01-09 ENCOUNTER — Encounter (HOSPITAL_COMMUNITY): Payer: Self-pay | Admitting: Orthopedic Surgery

## 2019-01-11 ENCOUNTER — Other Ambulatory Visit (HOSPITAL_COMMUNITY)
Admission: RE | Admit: 2019-01-11 | Discharge: 2019-01-11 | Disposition: A | Discharge status: Home / Self Care | Payer: Medicare Other | Source: Other Acute Inpatient Hospital | Attending: Orthopedic Surgery | Admitting: Orthopedic Surgery

## 2019-01-11 DIAGNOSIS — Z96659 Presence of unspecified artificial knee joint: Secondary | ICD-10-CM | POA: Insufficient documentation

## 2019-01-11 DIAGNOSIS — T8459XA Infection and inflammatory reaction due to other internal joint prosthesis, initial encounter: Secondary | ICD-10-CM | POA: Diagnosis present

## 2019-01-11 LAB — CBC WITH DIFFERENTIAL/PLATELET
Abs Immature Granulocytes: 0.03 10*3/uL (ref 0.00–0.07)
Basophils Absolute: 0 10*3/uL (ref 0.0–0.1)
Basophils Relative: 1 %
Eosinophils Absolute: 0.1 10*3/uL (ref 0.0–0.5)
Eosinophils Relative: 3 %
HCT: 31.9 % — ABNORMAL LOW (ref 39.0–52.0)
Hemoglobin: 9.5 g/dL — ABNORMAL LOW (ref 13.0–17.0)
Immature Granulocytes: 1 %
Lymphocytes Relative: 18 %
Lymphs Abs: 0.8 10*3/uL (ref 0.7–4.0)
MCH: 29.2 pg (ref 26.0–34.0)
MCHC: 29.8 g/dL — ABNORMAL LOW (ref 30.0–36.0)
MCV: 98.2 fL (ref 80.0–100.0)
Monocytes Absolute: 0.6 10*3/uL (ref 0.1–1.0)
Monocytes Relative: 12 %
Neutro Abs: 3 10*3/uL (ref 1.7–7.7)
Neutrophils Relative %: 65 %
Platelets: 241 10*3/uL (ref 150–400)
RBC: 3.25 MIL/uL — ABNORMAL LOW (ref 4.22–5.81)
RDW: 15.5 % (ref 11.5–15.5)
WBC: 4.5 10*3/uL (ref 4.0–10.5)
nRBC: 0 % (ref 0.0–0.2)

## 2019-01-11 LAB — BASIC METABOLIC PANEL
Anion gap: 10 (ref 5–15)
BUN: 20 mg/dL (ref 8–23)
CO2: 25 mmol/L (ref 22–32)
Calcium: 9.1 mg/dL (ref 8.9–10.3)
Chloride: 108 mmol/L (ref 98–111)
Creatinine, Ser: 0.88 mg/dL (ref 0.61–1.24)
GFR calc Af Amer: >60 mL/min (ref >60)
GFR calc non Af Amer: >60 mL/min (ref >60)
Glucose, Bld: 129 mg/dL — ABNORMAL HIGH (ref 70–99)
Potassium: 4.3 mmol/L (ref 3.5–5.1)
Sodium: 143 mmol/L (ref 135–145)

## 2019-01-11 LAB — SEDIMENTATION RATE: Sed Rate: 75 mm/hr — ABNORMAL HIGH (ref 0–16)

## 2019-01-11 LAB — VANCOMYCIN, TROUGH: Vancomycin Tr: 12 ug/mL — ABNORMAL LOW (ref 15–20)

## 2019-01-11 LAB — C-REACTIVE PROTEIN: CRP: 6.1 mg/dL — ABNORMAL HIGH (ref <1.0)

## 2019-01-15 ENCOUNTER — Other Ambulatory Visit (HOSPITAL_COMMUNITY)
Admission: RE | Admit: 2019-01-15 | Discharge: 2019-01-15 | Disposition: A | Discharge status: Home / Self Care | Payer: Medicare Other | Source: Other Acute Inpatient Hospital | Attending: Orthopedic Surgery | Admitting: Orthopedic Surgery

## 2019-01-15 DIAGNOSIS — Z8619 Personal history of other infectious and parasitic diseases: Secondary | ICD-10-CM | POA: Diagnosis not present

## 2019-01-15 DIAGNOSIS — T8450XA Infection and inflammatory reaction due to unspecified internal joint prosthesis, initial encounter: Secondary | ICD-10-CM | POA: Diagnosis present

## 2019-01-15 LAB — BASIC METABOLIC PANEL
Anion gap: 12 (ref 5–15)
BUN: 19 mg/dL (ref 8–23)
CO2: 24 mmol/L (ref 22–32)
Calcium: 9.5 mg/dL (ref 8.9–10.3)
Chloride: 106 mmol/L (ref 98–111)
Creatinine, Ser: 0.86 mg/dL (ref 0.61–1.24)
GFR calc Af Amer: >60 mL/min (ref >60)
GFR calc non Af Amer: >60 mL/min (ref >60)
Glucose, Bld: 86 mg/dL (ref 70–99)
Potassium: 4.3 mmol/L (ref 3.5–5.1)
Sodium: 142 mmol/L (ref 135–145)

## 2019-01-15 LAB — CBC WITH DIFFERENTIAL/PLATELET
Abs Immature Granulocytes: 0 10*3/uL (ref 0.00–0.07)
Basophils Absolute: 0 10*3/uL (ref 0.0–0.1)
Basophils Relative: 1 %
Eosinophils Absolute: 0.1 10*3/uL (ref 0.0–0.5)
Eosinophils Relative: 2 %
HCT: 36.1 % — ABNORMAL LOW (ref 39.0–52.0)
Hemoglobin: 10.6 g/dL — ABNORMAL LOW (ref 13.0–17.0)
Immature Granulocytes: 0 %
Lymphocytes Relative: 18 %
Lymphs Abs: 0.8 10*3/uL (ref 0.7–4.0)
MCH: 28.8 pg (ref 26.0–34.0)
MCHC: 29.4 g/dL — ABNORMAL LOW (ref 30.0–36.0)
MCV: 98.1 fL (ref 80.0–100.0)
Monocytes Absolute: 0.4 10*3/uL (ref 0.1–1.0)
Monocytes Relative: 10 %
Neutro Abs: 2.9 10*3/uL (ref 1.7–7.7)
Neutrophils Relative %: 69 %
Platelets: 281 10*3/uL (ref 150–400)
RBC: 3.68 MIL/uL — ABNORMAL LOW (ref 4.22–5.81)
RDW: 15.8 % — ABNORMAL HIGH (ref 11.5–15.5)
WBC: 4.2 10*3/uL (ref 4.0–10.5)
nRBC: 0 % (ref 0.0–0.2)

## 2019-01-15 LAB — C-REACTIVE PROTEIN: CRP: 3.1 mg/dL — ABNORMAL HIGH (ref <1.0)

## 2019-01-15 LAB — VANCOMYCIN, TROUGH: Vancomycin Tr: 16 ug/mL (ref 15–20)

## 2019-01-18 ENCOUNTER — Other Ambulatory Visit (HOSPITAL_COMMUNITY)
Admission: RE | Admit: 2019-01-18 | Discharge: 2019-01-18 | Disposition: A | Discharge status: Home / Self Care | Payer: Medicare Other | Source: Other Acute Inpatient Hospital | Attending: Orthopedic Surgery | Admitting: Orthopedic Surgery

## 2019-01-18 DIAGNOSIS — T8450XA Infection and inflammatory reaction due to unspecified internal joint prosthesis, initial encounter: Secondary | ICD-10-CM | POA: Insufficient documentation

## 2019-01-18 LAB — BASIC METABOLIC PANEL
Anion gap: 11 (ref 5–15)
BUN: 20 mg/dL (ref 8–23)
CO2: 23 mmol/L (ref 22–32)
Calcium: 9.3 mg/dL (ref 8.9–10.3)
Chloride: 107 mmol/L (ref 98–111)
Creatinine, Ser: 0.93 mg/dL (ref 0.61–1.24)
GFR calc Af Amer: >60 mL/min (ref >60)
GFR calc non Af Amer: >60 mL/min (ref >60)
Glucose, Bld: 109 mg/dL — ABNORMAL HIGH (ref 70–99)
Potassium: 4 mmol/L (ref 3.5–5.1)
Sodium: 141 mmol/L (ref 135–145)

## 2019-01-18 LAB — VANCOMYCIN, TROUGH: Vancomycin Tr: 16 ug/mL (ref 15–20)

## 2019-01-22 ENCOUNTER — Other Ambulatory Visit (HOSPITAL_COMMUNITY)
Admission: RE | Admit: 2019-01-22 | Discharge: 2019-01-22 | Disposition: A | Discharge status: Home / Self Care | Payer: Medicare Other | Source: Other Acute Inpatient Hospital | Attending: Orthopedic Surgery | Admitting: Orthopedic Surgery

## 2019-01-22 DIAGNOSIS — T8450XA Infection and inflammatory reaction due to unspecified internal joint prosthesis, initial encounter: Secondary | ICD-10-CM | POA: Insufficient documentation

## 2019-01-22 LAB — BASIC METABOLIC PANEL
Anion gap: 11 (ref 5–15)
BUN: 14 mg/dL (ref 8–23)
CO2: 24 mmol/L (ref 22–32)
Calcium: 9.6 mg/dL (ref 8.9–10.3)
Chloride: 105 mmol/L (ref 98–111)
Creatinine, Ser: 0.98 mg/dL (ref 0.61–1.24)
GFR calc Af Amer: >60 mL/min (ref >60)
GFR calc non Af Amer: >60 mL/min (ref >60)
Glucose, Bld: 113 mg/dL — ABNORMAL HIGH (ref 70–99)
Potassium: 4.3 mmol/L (ref 3.5–5.1)
Sodium: 140 mmol/L (ref 135–145)

## 2019-01-22 LAB — CBC WITH DIFFERENTIAL/PLATELET
Abs Immature Granulocytes: 0.01 10*3/uL (ref 0.00–0.07)
Basophils Absolute: 0 10*3/uL (ref 0.0–0.1)
Basophils Relative: 1 %
Eosinophils Absolute: 0.1 10*3/uL (ref 0.0–0.5)
Eosinophils Relative: 2 %
HCT: 35 % — ABNORMAL LOW (ref 39.0–52.0)
Hemoglobin: 10.6 g/dL — ABNORMAL LOW (ref 13.0–17.0)
Immature Granulocytes: 0 %
Lymphocytes Relative: 17 %
Lymphs Abs: 0.7 10*3/uL (ref 0.7–4.0)
MCH: 29.4 pg (ref 26.0–34.0)
MCHC: 30.3 g/dL (ref 30.0–36.0)
MCV: 97 fL (ref 80.0–100.0)
Monocytes Absolute: 0.5 10*3/uL (ref 0.1–1.0)
Monocytes Relative: 13 %
Neutro Abs: 2.6 10*3/uL (ref 1.7–7.7)
Neutrophils Relative %: 67 %
Platelets: 267 10*3/uL (ref 150–400)
RBC: 3.61 MIL/uL — ABNORMAL LOW (ref 4.22–5.81)
RDW: 16.3 % — ABNORMAL HIGH (ref 11.5–15.5)
WBC: 3.8 10*3/uL — ABNORMAL LOW (ref 4.0–10.5)
nRBC: 0 % (ref 0.0–0.2)

## 2019-01-22 LAB — C-REACTIVE PROTEIN: CRP: 6.7 mg/dL — ABNORMAL HIGH (ref <1.0)

## 2019-01-22 LAB — SEDIMENTATION RATE: Sed Rate: 58 mm/hr — ABNORMAL HIGH (ref 0–16)

## 2019-01-22 LAB — VANCOMYCIN, TROUGH: Vancomycin Tr: 16 ug/mL (ref 15–20)

## 2019-01-25 ENCOUNTER — Other Ambulatory Visit (HOSPITAL_COMMUNITY)
Admit: 2019-01-25 | Discharge: 2019-01-25 | Disposition: A | Discharge status: Home / Self Care | Payer: Medicare Other | Source: Ambulatory Visit | Attending: Orthopedic Surgery | Admitting: Orthopedic Surgery

## 2019-01-25 DIAGNOSIS — Y792 Prosthetic and other implants, materials and accessory orthopedic devices associated with adverse incidents: Secondary | ICD-10-CM | POA: Insufficient documentation

## 2019-01-25 DIAGNOSIS — T8454XA Infection and inflammatory reaction due to internal left knee prosthesis, initial encounter: Secondary | ICD-10-CM | POA: Insufficient documentation

## 2019-01-25 LAB — BASIC METABOLIC PANEL
Anion gap: 10 (ref 5–15)
BUN: 15 mg/dL (ref 8–23)
CO2: 26 mmol/L (ref 22–32)
Calcium: 9.4 mg/dL (ref 8.9–10.3)
Chloride: 103 mmol/L (ref 98–111)
Creatinine, Ser: 1.05 mg/dL (ref 0.61–1.24)
GFR calc Af Amer: >60 mL/min (ref >60)
GFR calc non Af Amer: >60 mL/min (ref >60)
Glucose, Bld: 136 mg/dL — ABNORMAL HIGH (ref 70–99)
Potassium: 4.2 mmol/L (ref 3.5–5.1)
Sodium: 139 mmol/L (ref 135–145)

## 2019-01-25 LAB — VANCOMYCIN, TROUGH: Vancomycin Tr: 16 ug/mL (ref 15–20)

## 2019-01-29 ENCOUNTER — Other Ambulatory Visit (HOSPITAL_COMMUNITY)
Admission: RE | Admit: 2019-01-29 | Discharge: 2019-01-29 | Disposition: A | Discharge status: Home / Self Care | Payer: Medicare Other | Source: Other Acute Inpatient Hospital | Attending: Orthopedic Surgery | Admitting: Orthopedic Surgery

## 2019-01-29 DIAGNOSIS — T8450XA Infection and inflammatory reaction due to unspecified internal joint prosthesis, initial encounter: Secondary | ICD-10-CM | POA: Insufficient documentation

## 2019-01-29 LAB — CBC WITH DIFFERENTIAL/PLATELET
Abs Immature Granulocytes: 0 10*3/uL (ref 0.00–0.07)
Basophils Absolute: 0 10*3/uL (ref 0.0–0.1)
Basophils Relative: 1 %
Eosinophils Absolute: 0.1 10*3/uL (ref 0.0–0.5)
Eosinophils Relative: 3 %
HCT: 34.6 % — ABNORMAL LOW (ref 39.0–52.0)
Hemoglobin: 10.2 g/dL — ABNORMAL LOW (ref 13.0–17.0)
Immature Granulocytes: 0 %
Lymphocytes Relative: 18 %
Lymphs Abs: 0.6 10*3/uL — ABNORMAL LOW (ref 0.7–4.0)
MCH: 28.7 pg (ref 26.0–34.0)
MCHC: 29.5 g/dL — ABNORMAL LOW (ref 30.0–36.0)
MCV: 97.2 fL (ref 80.0–100.0)
Monocytes Absolute: 0.4 10*3/uL (ref 0.1–1.0)
Monocytes Relative: 12 %
Neutro Abs: 2 10*3/uL (ref 1.7–7.7)
Neutrophils Relative %: 66 %
Platelets: 295 10*3/uL (ref 150–400)
RBC: 3.56 MIL/uL — ABNORMAL LOW (ref 4.22–5.81)
RDW: 16.3 % — ABNORMAL HIGH (ref 11.5–15.5)
WBC: 3 10*3/uL — ABNORMAL LOW (ref 4.0–10.5)
nRBC: 0 % (ref 0.0–0.2)

## 2019-01-29 LAB — BASIC METABOLIC PANEL
Anion gap: 12 (ref 5–15)
BUN: 16 mg/dL (ref 8–23)
CO2: 22 mmol/L (ref 22–32)
Calcium: 9.5 mg/dL (ref 8.9–10.3)
Chloride: 110 mmol/L (ref 98–111)
Creatinine, Ser: 0.96 mg/dL (ref 0.61–1.24)
GFR calc Af Amer: >60 mL/min (ref >60)
GFR calc non Af Amer: >60 mL/min (ref >60)
Glucose, Bld: 103 mg/dL — ABNORMAL HIGH (ref 70–99)
Potassium: 4.2 mmol/L (ref 3.5–5.1)
Sodium: 144 mmol/L (ref 135–145)

## 2019-01-29 LAB — VANCOMYCIN, TROUGH: Vancomycin Tr: 17 ug/mL (ref 15–20)

## 2019-01-29 LAB — C-REACTIVE PROTEIN: CRP: 3.6 mg/dL — ABNORMAL HIGH (ref <1.0)

## 2019-01-29 LAB — SEDIMENTATION RATE: Sed Rate: 75 mm/hr — ABNORMAL HIGH (ref 0–16)

## 2019-02-01 ENCOUNTER — Other Ambulatory Visit (HOSPITAL_COMMUNITY)
Admit: 2019-02-01 | Discharge: 2019-02-01 | Disposition: A | Discharge status: Home / Self Care | Payer: Medicare Other | Source: Ambulatory Visit | Attending: Orthopedic Surgery | Admitting: Orthopedic Surgery

## 2019-02-01 DIAGNOSIS — T8450XA Infection and inflammatory reaction due to unspecified internal joint prosthesis, initial encounter: Secondary | ICD-10-CM | POA: Diagnosis present

## 2019-02-01 LAB — BASIC METABOLIC PANEL
Anion gap: 10 (ref 5–15)
BUN: 14 mg/dL (ref 8–23)
CO2: 24 mmol/L (ref 22–32)
Calcium: 9.8 mg/dL (ref 8.9–10.3)
Chloride: 106 mmol/L (ref 98–111)
Creatinine, Ser: 1.13 mg/dL (ref 0.61–1.24)
GFR calc Af Amer: >60 mL/min (ref >60)
GFR calc non Af Amer: >60 mL/min (ref >60)
Glucose, Bld: 134 mg/dL — ABNORMAL HIGH (ref 70–99)
Potassium: 4.5 mmol/L (ref 3.5–5.1)
Sodium: 140 mmol/L (ref 135–145)

## 2019-02-01 LAB — VANCOMYCIN, TROUGH: Vancomycin Tr: 19 ug/mL (ref 15–20)

## 2019-02-05 ENCOUNTER — Other Ambulatory Visit (HOSPITAL_COMMUNITY)
Admission: RE | Admit: 2019-02-05 | Discharge: 2019-02-05 | Disposition: A | Discharge status: Home / Self Care | Payer: Medicare Other | Source: Other Acute Inpatient Hospital | Attending: Orthopedic Surgery | Admitting: Orthopedic Surgery

## 2019-02-05 DIAGNOSIS — T8469XA Infection and inflammatory reaction due to internal fixation device of other site, initial encounter: Secondary | ICD-10-CM | POA: Insufficient documentation

## 2019-02-05 LAB — CBC WITH DIFFERENTIAL/PLATELET
Abs Immature Granulocytes: 0.01 10*3/uL (ref 0.00–0.07)
Basophils Absolute: 0 10*3/uL (ref 0.0–0.1)
Basophils Relative: 1 %
Eosinophils Absolute: 0.1 10*3/uL (ref 0.0–0.5)
Eosinophils Relative: 2 %
HCT: 38.1 % — ABNORMAL LOW (ref 39.0–52.0)
Hemoglobin: 11.1 g/dL — ABNORMAL LOW (ref 13.0–17.0)
Immature Granulocytes: 0 %
Lymphocytes Relative: 18 %
Lymphs Abs: 0.6 10*3/uL — ABNORMAL LOW (ref 0.7–4.0)
MCH: 28.9 pg (ref 26.0–34.0)
MCHC: 29.1 g/dL — ABNORMAL LOW (ref 30.0–36.0)
MCV: 99.2 fL (ref 80.0–100.0)
Monocytes Absolute: 0.4 10*3/uL (ref 0.1–1.0)
Monocytes Relative: 13 %
Neutro Abs: 2.3 10*3/uL (ref 1.7–7.7)
Neutrophils Relative %: 66 %
Platelets: 285 10*3/uL (ref 150–400)
RBC: 3.84 MIL/uL — ABNORMAL LOW (ref 4.22–5.81)
RDW: 16.2 % — ABNORMAL HIGH (ref 11.5–15.5)
WBC: 3.5 10*3/uL — ABNORMAL LOW (ref 4.0–10.5)
nRBC: 0 % (ref 0.0–0.2)

## 2019-02-05 LAB — BASIC METABOLIC PANEL
Anion gap: 10 (ref 5–15)
BUN: 20 mg/dL (ref 8–23)
CO2: 25 mmol/L (ref 22–32)
Calcium: 9.3 mg/dL (ref 8.9–10.3)
Chloride: 106 mmol/L (ref 98–111)
Creatinine, Ser: 1 mg/dL (ref 0.61–1.24)
GFR calc Af Amer: >60 mL/min (ref >60)
GFR calc non Af Amer: >60 mL/min (ref >60)
Glucose, Bld: 89 mg/dL (ref 70–99)
Potassium: 4.4 mmol/L (ref 3.5–5.1)
Sodium: 141 mmol/L (ref 135–145)

## 2019-02-05 LAB — VANCOMYCIN, TROUGH: Vancomycin Tr: 19 ug/mL (ref 15–20)

## 2019-02-05 LAB — C-REACTIVE PROTEIN: CRP: 3.9 mg/dL — ABNORMAL HIGH (ref <1.0)

## 2019-02-05 LAB — SEDIMENTATION RATE: Sed Rate: 53 mm/hr — ABNORMAL HIGH (ref 0–16)

## 2019-02-08 ENCOUNTER — Other Ambulatory Visit (HOSPITAL_COMMUNITY)
Admission: RE | Admit: 2019-02-08 | Discharge: 2019-02-08 | Disposition: A | Discharge status: Home / Self Care | Payer: Medicare Other | Source: Other Acute Inpatient Hospital | Attending: Orthopedic Surgery | Admitting: Orthopedic Surgery

## 2019-02-08 DIAGNOSIS — T847XXA Infection and inflammatory reaction due to other internal orthopedic prosthetic devices, implants and grafts, initial encounter: Secondary | ICD-10-CM | POA: Diagnosis present

## 2019-02-08 DIAGNOSIS — X58XXXA Exposure to other specified factors, initial encounter: Secondary | ICD-10-CM | POA: Diagnosis not present

## 2019-02-08 LAB — BASIC METABOLIC PANEL
Anion gap: 10 (ref 5–15)
BUN: 22 mg/dL (ref 8–23)
CO2: 26 mmol/L (ref 22–32)
Calcium: 9.5 mg/dL (ref 8.9–10.3)
Chloride: 106 mmol/L (ref 98–111)
Creatinine, Ser: 0.98 mg/dL (ref 0.61–1.24)
GFR calc Af Amer: >60 mL/min (ref >60)
GFR calc non Af Amer: >60 mL/min (ref >60)
Glucose, Bld: 75 mg/dL (ref 70–99)
Potassium: 4.1 mmol/L (ref 3.5–5.1)
Sodium: 142 mmol/L (ref 135–145)

## 2019-02-08 LAB — VANCOMYCIN, TROUGH: Vancomycin Tr: 19 ug/mL (ref 15–20)

## 2019-02-12 ENCOUNTER — Other Ambulatory Visit (HOSPITAL_COMMUNITY)
Admission: RE | Admit: 2019-02-12 | Discharge: 2019-02-12 | Disposition: A | Discharge status: Home / Self Care | Payer: Medicare Other | Source: Other Acute Inpatient Hospital | Attending: Orthopedic Surgery | Admitting: Orthopedic Surgery

## 2019-02-12 DIAGNOSIS — T8450XA Infection and inflammatory reaction due to unspecified internal joint prosthesis, initial encounter: Secondary | ICD-10-CM | POA: Insufficient documentation

## 2019-02-12 LAB — CBC WITH DIFFERENTIAL/PLATELET
Abs Immature Granulocytes: 0.01 10*3/uL (ref 0.00–0.07)
Basophils Absolute: 0 10*3/uL (ref 0.0–0.1)
Basophils Relative: 1 %
Eosinophils Absolute: 0.1 10*3/uL (ref 0.0–0.5)
Eosinophils Relative: 2 %
HCT: 37.4 % — ABNORMAL LOW (ref 39.0–52.0)
Hemoglobin: 11.3 g/dL — ABNORMAL LOW (ref 13.0–17.0)
Immature Granulocytes: 0 %
Lymphocytes Relative: 19 %
Lymphs Abs: 0.7 10*3/uL (ref 0.7–4.0)
MCH: 29.2 pg (ref 26.0–34.0)
MCHC: 30.2 g/dL (ref 30.0–36.0)
MCV: 96.6 fL (ref 80.0–100.0)
Monocytes Absolute: 0.4 10*3/uL (ref 0.1–1.0)
Monocytes Relative: 12 %
Neutro Abs: 2.5 10*3/uL (ref 1.7–7.7)
Neutrophils Relative %: 66 %
Platelets: 270 10*3/uL (ref 150–400)
RBC: 3.87 MIL/uL — ABNORMAL LOW (ref 4.22–5.81)
RDW: 15.9 % — ABNORMAL HIGH (ref 11.5–15.5)
WBC: 3.7 10*3/uL — ABNORMAL LOW (ref 4.0–10.5)
nRBC: 0 % (ref 0.0–0.2)

## 2019-02-12 LAB — BASIC METABOLIC PANEL
Anion gap: 10 (ref 5–15)
BUN: 20 mg/dL (ref 8–23)
CO2: 26 mmol/L (ref 22–32)
Calcium: 9.9 mg/dL (ref 8.9–10.3)
Chloride: 105 mmol/L (ref 98–111)
Creatinine, Ser: 1.04 mg/dL (ref 0.61–1.24)
GFR calc Af Amer: >60 mL/min (ref >60)
GFR calc non Af Amer: >60 mL/min (ref >60)
Glucose, Bld: 110 mg/dL — ABNORMAL HIGH (ref 70–99)
Potassium: 4.1 mmol/L (ref 3.5–5.1)
Sodium: 141 mmol/L (ref 135–145)

## 2019-02-12 LAB — SEDIMENTATION RATE: Sed Rate: 64 mm/hr — ABNORMAL HIGH (ref 0–16)

## 2019-02-12 LAB — VANCOMYCIN, TROUGH: Vancomycin Tr: 17 ug/mL (ref 15–20)

## 2019-02-12 LAB — C-REACTIVE PROTEIN: CRP: 4.1 mg/dL — ABNORMAL HIGH (ref <1.0)

## 2019-02-14 ENCOUNTER — Ambulatory Visit: Payer: Medicare Other | Admitting: Infectious Disease

## 2019-02-15 ENCOUNTER — Other Ambulatory Visit (HOSPITAL_COMMUNITY)
Admission: RE | Admit: 2019-02-15 | Discharge: 2019-02-15 | Disposition: A | Discharge status: Home / Self Care | Payer: Medicare Other | Source: Ambulatory Visit | Attending: Orthopedic Surgery | Admitting: Orthopedic Surgery

## 2019-02-15 DIAGNOSIS — T8450XA Infection and inflammatory reaction due to unspecified internal joint prosthesis, initial encounter: Secondary | ICD-10-CM | POA: Diagnosis present

## 2019-02-15 LAB — BASIC METABOLIC PANEL
Anion gap: 9 (ref 5–15)
BUN: 21 mg/dL (ref 8–23)
CO2: 25 mmol/L (ref 22–32)
Calcium: 9.5 mg/dL (ref 8.9–10.3)
Chloride: 103 mmol/L (ref 98–111)
Creatinine, Ser: 1 mg/dL (ref 0.61–1.24)
GFR calc Af Amer: >60 mL/min (ref >60)
GFR calc non Af Amer: >60 mL/min (ref >60)
Glucose, Bld: 86 mg/dL (ref 70–99)
Potassium: 5.2 mmol/L — ABNORMAL HIGH (ref 3.5–5.1)
Sodium: 137 mmol/L (ref 135–145)

## 2019-02-15 LAB — VANCOMYCIN, TROUGH: Vancomycin Tr: 18 ug/mL (ref 15–20)

## 2019-03-06 ENCOUNTER — Other Ambulatory Visit: Payer: Self-pay

## 2019-03-06 ENCOUNTER — Ambulatory Visit (HOSPITAL_COMMUNITY)
Admission: RE | Admit: 2019-03-06 | Discharge: 2019-03-06 | Disposition: A | Discharge status: Home / Self Care | Payer: Medicare Other | Source: Ambulatory Visit | Attending: Cardiovascular Disease | Admitting: Cardiovascular Disease

## 2019-03-06 ENCOUNTER — Other Ambulatory Visit (HOSPITAL_COMMUNITY): Payer: Self-pay | Admitting: Orthopedic Surgery

## 2019-03-06 DIAGNOSIS — M7989 Other specified soft tissue disorders: Secondary | ICD-10-CM

## 2019-03-06 DIAGNOSIS — M79662 Pain in left lower leg: Secondary | ICD-10-CM | POA: Insufficient documentation

## 2019-03-26 ENCOUNTER — Encounter (HOSPITAL_COMMUNITY)
Admission: RE | Admit: 2019-03-26 | Discharge: 2019-03-26 | Disposition: A | Discharge status: Home / Self Care | Payer: Medicare Other | Source: Ambulatory Visit | Attending: Orthopedic Surgery | Admitting: Orthopedic Surgery

## 2019-03-26 ENCOUNTER — Other Ambulatory Visit: Payer: Self-pay

## 2019-03-26 ENCOUNTER — Encounter (HOSPITAL_COMMUNITY): Payer: Self-pay

## 2019-03-26 ENCOUNTER — Other Ambulatory Visit (HOSPITAL_COMMUNITY): Payer: Self-pay | Admitting: *Deleted

## 2019-03-26 NOTE — Progress Notes (Signed)
PCP - Dr. Bernerd Limbo Cardiologist - Dr. Dani Gobble Croitoru  LOV-08/15/2018 epic  Chest x-ray - n/a EKG - 01/02/2019  epic Stress Test -n/a  ECHO - 10/11/2012 and 08/27/2012  epic Cardiac Cath - 15 years ago -negative results  Sleep Study - 09/26/2012- [facility] EPIC CPAP - yes  Fasting Blood Sugar - n/a Checks Blood Sugar __0___ times a day  Blood Thinner Instructions:Xarelto. Patient states that Dr. Alvan Dame instructed him to stop his Xarelto 3 days before surgery. Instructed patient to contact Dr. Victorino December office and get instructions from him about stopping Xarelto. Patient verbalized understanding. Aspirin Instructions:n/a Last Dose:? 3 days prior to surgery per Dr. Alvan Dame  Anesthesia review:  Chart to be reviewed by Konrad Felix, PA.  Patient has a history of Atrial fibrillation, DVT, CHF (2017), and OSA and on CPAP.  Patient states he has  shortness of breath all the time due to his A-Fib.but no c/o  fever, cough and chest pain at PAT appointment   Patient verbalized understanding of instructions that were given to them at the PAT appointment. Patient was also instructed that they will need to review over the PAT instructions again at home before surgery.

## 2019-03-26 NOTE — Patient Instructions (Signed)
DUE TO COVID-19 ONLY ONE VISITOR IS ALLOWED TO COME WITH YOU AND STAY IN THE WAITING ROOM ONLY DURING PRE OP AND PROCEDURE DAY OF SURGERY. THE 1 VISITOR MAY VISIT WITH YOU AFTER SURGERY IN YOUR PRIVATE ROOM DURING VISITING HOURS ONLY!  YOU NEED TO HAVE A COVID 19 TEST ON__  Friday 02/19/2021_____ @__   11:10 am_____, THIS TEST MUST BE DONE BEFORE SURGERY, COME  [name] , [id number].  ([place]) ONCE YOUR COVID TEST IS COMPLETED, PLEASE BEGIN THE QUARANTINE INSTRUCTIONS AS OUTLINED IN YOUR HANDOUT.                [name]     Your procedure is scheduled on: Tuesday 04/03/2019   Report to [facility] Main  Entrance    Report to admitting at 1200 noon     Call this number if you have problems the morning of surgery [phone]    Remember: Do not eat food :After Midnight.    NO SOLID FOOD AFTER MIDNIGHT THE NIGHT PRIOR TO SURGERY. NOTHING BY MOUTH EXCEPT CLEAR LIQUIDS UNTIL  1130 am .     PLEASE FINISH ENSURE DRINK PER SURGEON ORDER  WHICH NEEDS TO BE COMPLETED AT  1130 am .   CLEAR LIQUID DIET   Foods Allowed                                                                     Foods Excluded  Coffee and tea, regular and decaf                             liquids that you cannot  Plain Jell-O any favor except red or purple                                           see through such as: Fruit ices (not with fruit pulp)                                     milk, soups, orange juice  Iced Popsicles                                    All solid food Carbonated beverages, regular and diet                                    Cranberry, grape and apple juices Sports drinks like Gatorade Lightly seasoned clear broth or consume(fat free) Sugar, honey syrup  Sample Menu Breakfast                                Lunch  Supper Cranberry juice                    Beef broth                             Chicken broth Jell-O                                     Grape juice                           Apple juice Coffee or tea                        Jell-O                                      Popsicle                                                Coffee or tea                        Coffee or tea  _____________________________________________________________________      BRUSH YOUR TEETH MORNING OF SURGERY AND RINSE YOUR MOUTH OUT, NO CHEWING GUM CANDY OR MINTS.     Take these medicines the morning of surgery with A SIP OF WATER: Metoprolol tartrate (Lopressor), Diltiazem (Cardura), Levothyroxine (Synthroid)                                 You may not have any metal on your body including hair pins and              piercings  Do not wear jewelry, make-up, lotions, powders or perfumes, deodorant                         Men may shave face and neck.   Do not bring valuables to the hospital. [place].  Contacts, dentures or bridgework may not be worn into surgery.  Leave suitcase in the car. After surgery it may be brought to your room.                  Please read over the following fact sheets you were given: _____________________________________________________________________             [facility] - Preparing for Surgery Before surgery, you can play an important role.  Because skin is not sterile, your skin needs to be as free of germs as possible.  You can reduce the number of germs on your skin by washing with CHG (chlorahexidine gluconate) soap before surgery.  CHG is an antiseptic cleaner which kills germs and bonds with the skin to continue killing germs even after washing. Please DO NOT use if you have an allergy to CHG or antibacterial soaps.  If your skin becomes reddened/irritated stop using the  CHG and inform your nurse when you arrive at Short Stay. Do not shave (including legs and underarms) for at least 48 hours prior  to the first CHG shower.  You may shave your face/neck. Please follow these instructions carefully:  1.  Shower with CHG Soap the night before surgery and the  morning of Surgery.  2.  If you choose to wash your hair, wash your hair first as usual with your  normal  shampoo.  3.  After you shampoo, rinse your hair and body thoroughly to remove the  shampoo.                           4.  Use CHG as you would any other liquid soap.  You can apply chg directly  to the skin and wash                       Gently with a scrungie or clean washcloth.  5.  Apply the CHG Soap to your body ONLY FROM THE NECK DOWN.   Do not use on face/ open                           Wound or open sores. Avoid contact with eyes, ears mouth and genitals (private parts).                       Wash face,  Genitals (private parts) with your normal soap.             6.  Wash thoroughly, paying special attention to the area where your surgery  will be performed.  7.  Thoroughly rinse your body with warm water from the neck down.  8.  DO NOT shower/wash with your normal soap after using and rinsing off  the CHG Soap.                9.  Pat yourself dry with a clean towel.            10.  Wear clean pajamas.            11.  Place clean sheets on your bed the night of your first shower and do not  sleep with pets. Day of Surgery : Do not apply any lotions/deodorants the morning of surgery.  Please wear clean clothes to the hospital/surgery center.  FAILURE TO FOLLOW THESE INSTRUCTIONS MAY RESULT IN THE CANCELLATION OF YOUR SURGERY PATIENT SIGNATURE_________________________________  NURSE SIGNATURE__________________________________  ________________________________________________________________________  WHAT IS A BLOOD TRANSFUSION? Blood Transfusion Information  A transfusion is the replacement of blood or some of its parts. Blood is made up of multiple cells which provide different functions.  Red blood cells carry oxygen  and are used for blood loss replacement.  White blood cells fight against infection.  Platelets control bleeding.  Plasma helps clot blood.  Other blood products are available for specialized needs, such as hemophilia or other clotting disorders. BEFORE THE TRANSFUSION  Who gives blood for transfusions?   Healthy volunteers who are fully evaluated to make sure their blood is safe. This is blood bank blood. Transfusion therapy is the safest it has ever been in the practice of medicine. Before blood is taken from a donor, a complete history is taken to make sure that person has no history of diseases nor engages in risky social behavior (examples are intravenous  drug use or sexual activity with multiple partners). The donor's travel history is screened to minimize risk of transmitting infections, such as malaria. The donated blood is tested for signs of infectious diseases, such as HIV and hepatitis. The blood is then tested to be sure it is compatible with you in order to minimize the chance of a transfusion reaction. If you or a relative donates blood, this is often done in anticipation of surgery and is not appropriate for emergency situations. It takes many days to process the donated blood. RISKS AND COMPLICATIONS Although transfusion therapy is very safe and saves many lives, the main dangers of transfusion include:   Getting an infectious disease.  Developing a transfusion reaction. This is an allergic reaction to something in the blood you were given. Every precaution is taken to prevent this. The decision to have a blood transfusion has been considered carefully by your caregiver before blood is given. Blood is not given unless the benefits outweigh the risks. AFTER THE TRANSFUSION  Right after receiving a blood transfusion, you will usually feel much better and more energetic. This is especially true if your red blood cells have gotten low (anemic). The transfusion raises the level of  the red blood cells which carry oxygen, and this usually causes an energy increase.  The nurse administering the transfusion will monitor you carefully for complications. HOME CARE INSTRUCTIONS  No special instructions are needed after a transfusion. You may find your energy is better. Speak with your caregiver about any limitations on activity for underlying diseases you may have. SEEK MEDICAL CARE IF:   Your condition is not improving after your transfusion.  You develop redness or irritation at the intravenous (IV) site. SEEK IMMEDIATE MEDICAL CARE IF:  Any of the following symptoms occur over the next 12 hours:  Shaking chills.  You have a temperature by mouth above 102 F (38.9 C), not controlled by medicine.  Chest, back, or muscle pain.  People around you feel you are not acting correctly or are confused.  Shortness of breath or difficulty breathing.  Dizziness and fainting.  You get a rash or develop hives.  You have a decrease in urine output.  Your urine turns a dark color or changes to pink, red, or brown. Any of the following symptoms occur over the next 10 days:  You have a temperature by mouth above 102 F (38.9 C), not controlled by medicine.  Shortness of breath.  Weakness after normal activity.  The white part of the eye turns yellow (jaundice).  You have a decrease in the amount of urine or are urinating less often.  Your urine turns a dark color or changes to pink, red, or brown. Document Released: 01/23/2000 Document Revised: 04/19/2011 Document Reviewed: 09/11/2007 [facility] Patient Information 2014 [place], [place].  _______________________________________________________________________

## 2019-03-30 ENCOUNTER — Other Ambulatory Visit (HOSPITAL_COMMUNITY)
Admission: RE | Admit: 2019-03-30 | Discharge: 2019-03-30 | Disposition: A | Discharge status: Home / Self Care | Payer: Medicare Other | Source: Ambulatory Visit | Attending: Orthopedic Surgery | Admitting: Orthopedic Surgery

## 2019-03-30 ENCOUNTER — Other Ambulatory Visit: Payer: Self-pay

## 2019-03-30 ENCOUNTER — Encounter (HOSPITAL_COMMUNITY)
Admission: RE | Admit: 2019-03-30 | Discharge: 2019-03-30 | Disposition: A | Discharge status: Home / Self Care | Payer: Medicare Other | Source: Ambulatory Visit | Attending: Orthopedic Surgery | Admitting: Orthopedic Surgery

## 2019-03-30 DIAGNOSIS — Z20822 Contact with and (suspected) exposure to covid-19: Secondary | ICD-10-CM | POA: Diagnosis not present

## 2019-03-30 DIAGNOSIS — Z01812 Encounter for preprocedural laboratory examination: Secondary | ICD-10-CM | POA: Diagnosis present

## 2019-03-30 LAB — CBC
HCT: 35.1 % — ABNORMAL LOW (ref 39.0–52.0)
Hemoglobin: 10.5 g/dL — ABNORMAL LOW (ref 13.0–17.0)
MCH: 28.8 pg (ref 26.0–34.0)
MCHC: 29.9 g/dL — ABNORMAL LOW (ref 30.0–36.0)
MCV: 96.4 fL (ref 80.0–100.0)
Platelets: 271 10*3/uL (ref 150–400)
RBC: 3.64 MIL/uL — ABNORMAL LOW (ref 4.22–5.81)
RDW: 16.6 % — ABNORMAL HIGH (ref 11.5–15.5)
WBC: 4 10*3/uL (ref 4.0–10.5)
nRBC: 0 % (ref 0.0–0.2)

## 2019-03-30 LAB — SARS CORONAVIRUS 2 (TAT 6-24 HRS): SARS Coronavirus 2: NEGATIVE

## 2019-03-30 LAB — BASIC METABOLIC PANEL
Anion gap: 11 (ref 5–15)
BUN: 18 mg/dL (ref 8–23)
CO2: 23 mmol/L (ref 22–32)
Calcium: 10.1 mg/dL (ref 8.9–10.3)
Chloride: 108 mmol/L (ref 98–111)
Creatinine, Ser: 1.12 mg/dL (ref 0.61–1.24)
GFR calc Af Amer: >60 mL/min (ref >60)
GFR calc non Af Amer: >60 mL/min (ref >60)
Glucose, Bld: 127 mg/dL — ABNORMAL HIGH (ref 70–99)
Potassium: 4.2 mmol/L (ref 3.5–5.1)
Sodium: 142 mmol/L (ref 135–145)

## 2019-03-30 LAB — SURGICAL PCR SCREEN
MRSA, PCR: NEGATIVE
Staphylococcus aureus: NEGATIVE

## 2019-04-02 MED ORDER — DEXTROSE 5 % IV SOLN
3.0000 g | INTRAVENOUS | Status: AC
  Administered 2019-04-03: 3 g via INTRAVENOUS
  Filled 2019-04-02: qty 3

## 2019-04-02 NOTE — H&P (Signed)
[name] is an 69 y.o. male.    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 male complaining of left knee pain. He is 13 weeks out from repeat I&D w/ placement of antibiotic spacer in left knee. He was last seen on 1/26 due to severity of lower leg pain. States he is not doing any better. Pain is very severe. He would like something stronger for pain. At his last office visit a repeat sedimentation rate and C-reactive proteins were ordered.  Labs were reviewed from 1 February indicating a C-reactive protein that has normalized to 0.5 to but a persistently elevated sedimentation rate of 95.  Due to his pain Dr. Alvan Dame recommends that he repeat again the irrigation and debridement of his left knee with replacement of the cemented polyethylene insert. Dr. Alvan Dame expressed concerns regarding an attempt at resection of his femoral component and the morbidity associated with this. Socially he lives alone and has to bear weight. During irrigation Irricept will be used and perhaps even a Betadine wash. PICC line will be placed and IV antibiotics like to begin based on cultures taken in the operating room.   Risks, benefits and expectations were discussed with the patient. Patient understand the risks, benefits and expectations and wishes to proceed with surgery.   PCP: Bernerd Limbo, MD  D/C Plans:       Home   Post-op Meds:       No Rx given  Tranexamic Acid:      To be given - IV   Decadron:      Is to be given  FYI:      Xarelto (on pre-op)  Oxycodone  PICC line  CPAP   DME:   Pt already has equipment   PT:  [facility]    PMH: Past Medical History:  Diagnosis Date  . CHF (congestive heart failure) ([place])   . Chronic anticoagulation    with Xarelto  . Complication of anesthesia    PT STATES HARD TO WAKE UP AFTER ONE SUGERY  -STATES THE SURGERY TOOK LONGER THAN EXPECTED.  NO PROBLEMS WITH ANY OTHER SURGERY  . Complication of anesthesia    in 12/2018-states started convulsing after surgery, could not get warm   . Dysrhythmia    A-fib  . GERD (gastroesophageal reflux disease)   . Headache   . History of blood clots   . History of blood transfusion   . Hypothyroidism   . Pain    BACK PAIN - PT ATTRIBUTES TO THE WAY HE WALKS DUE TO LEFT KNEE PROBLEM  . Persistent atrial fibrillation ([place])   . Septic arthritis of knee ([facility])    LEFT KNEE  . Shortness of breath    WITH EXERTION AND PAIN  . Sleep apnea    uses CPAP,    PSH: Past Surgical History:  Procedure Laterality Date  . 2010 REMOVAL OF LEFT TOTAL KNEE    . CARDIAC CATHETERIZATION    . CARDIOVERSION N/A 08/28/2012   Procedure: CARDIOVERSION;  Surgeon: Sanda Klein, MD;  Location: [facility];  Service: Cardiovascular;  Laterality: N/A;  . CARDIOVERSION N/A 09/01/2012   Procedure: CARDIOVERSION;  Surgeon: Pixie Casino, MD;  Location: [facility];  Service: Cardiovascular;  Laterality: N/A;  . COLONOSCOPY N/A 06/13/2013   Procedure: COLONOSCOPY;  Surgeon: Inda Castle, MD;  Location: [facility];  Service: Endoscopy;  Laterality: N/A;  . EXCISIONAL TOTAL KNEE ARTHROPLASTY WITH ANTIBIOTIC SPACERS Left 09/21/2018   Procedure: Resection of tibia versus both components with placement of antibiotic spacer;  Surgeon: Paralee Cancel, MD;  Location: [facility];  Service: Orthopedics;  Laterality: Left;  2.5 hrs  . EXCISIONAL TOTAL KNEE ARTHROPLASTY WITH ANTIBIOTIC SPACERS Left 01/02/2019   Procedure: Repeat Washout and placement of antibiotic spacer left knee;  Surgeon: Paralee Cancel, MD;  Location: [facility];  Service: Orthopedics;  Laterality: Left;  2 hrs  . HYDROCELECTOY   2012  . LEFT KNEE ARTHROSCOPY  1999  . LEFT KNEE SURGERY UPPER TIBIAL OSTOMY    . LEFT TOTAL KNEE REMOVAL FOR INFECTION  2006  . REIMPLANTATION LEFT TOTAL KNEE  2008  . REIMPLANTATION LEFT TOTAL KNEE    2006  . REIMPLANTATION OF TOTAL KNEE Left 06/23/2012   Procedure: REIMPLANTATION OF LEFT TOTAL KNEE;  Surgeon: Mauri Pole, MD;  Location: [facility];  Service: Orthopedics;  Laterality: Left;  . REMOVAL LEFT TOTAL KNEE   2008  . REPLACEMENT LEFT KNEE  2002  . REPLACEMENT RIGHT KNEE  2003  . REVISION LEFT KNEE CAP   2004  . RIGHT KNEE ARTHROSCOPY  1998  . TEE WITHOUT CARDIOVERSION N/A 08/28/2012   Procedure: TRANSESOPHAGEAL ECHOCARDIOGRAM (TEE);  Surgeon: Sanda Klein, MD;  Location: [facility];  Service: Cardiovascular;  Laterality: N/A;  . TONSILLECTOMY    . TOTAL KNEE REVISION Left 12/15/2015   Procedure: TOTAL KNEE REVISION REPLACEMENT;  Surgeon: Paralee Cancel, MD;  Location: [facility];  Service: Orthopedics;  Laterality: Left;  Adductor Block    Social History:  reports that he has never smoked. He has never used smokeless tobacco. He reports current alcohol use. He reports that he does not use drugs.  Allergies:  Allergies  Allergen Reactions  . Morphine Hives    Medications: Current Facility-Administered Medications  Medication Dose Route Frequency Provider Last Rate Last Admin  . [START ON 04/03/2019] ceFAZolin (ANCEF) 3 g in dextrose 5 % 50 mL IVPB  3 g Intravenous On Call to OR Paralee Cancel, MD       Current Outpatient Medications  Medication Sig Dispense Refill  . diltiazem (CARDIZEM CD) 240 MG 24 hr capsule TAKE 1 CAPSULE BY MOUTH DAILY (Patient taking differently: Take 240 mg by mouth daily. ) 90 capsule 3  . levothyroxine (SYNTHROID) 125 MCG tablet Take 125 mcg by mouth daily before breakfast.     . metoprolol tartrate (LOPRESSOR) 50 MG tablet TAKE 1 TABLET BY MOUTH TWICE DAILY (Patient taking differently: Take 50 mg by mouth 2 (two) times daily. ) 180 tablet 2  . Oxycodone HCl 10 MG TABS Take 10 mg by mouth at bedtime.    . rivaroxaban (XARELTO) 20 MG TABS tablet Take 1 tablet (20 mg total) by mouth daily with breakfast. 90 tablet 3  . docusate sodium (COLACE) 100 MG capsule  Take 1 capsule (100 mg total) by mouth 2 (two) times daily. (Patient not taking: Reported on 03/26/2019) 28 capsule 0  . ferrous sulfate (FERROUSUL) 325 (65 FE) MG tablet Take 1 tablet (325 mg total) by mouth 3 (three) times daily with meals for 14 days. (Patient not taking: Reported on 03/26/2019) 42 tablet 0  . methocarbamol (ROBAXIN) 500 MG tablet Take 1 tablet (500 mg total) by mouth every 6 (six) hours as needed for muscle spasms. (Patient not taking: Reported on 03/26/2019) 40 tablet  0  . Multiple Vitamin (MULTIVITAMIN WITH MINERALS) TABS Take 1 tablet by mouth daily.    Marland Kitchen oxyCODONE (OXY IR/ROXICODONE) 5 MG immediate release tablet Take 1-2 tablets (5-10 mg total) by mouth every 4 (four) hours as needed for moderate pain or severe pain. (Patient not taking: Reported on 03/26/2019) 60 tablet 0  . polyethylene glycol (MIRALAX / GLYCOLAX) 17 g packet Take 17 g by mouth 2 (two) times daily. (Patient not taking: Reported on 03/26/2019) 28 packet 0       Review of Systems  Constitutional: Negative.   HENT: Negative.   Eyes: Negative.   Respiratory: Negative.   Cardiovascular: Negative.   Gastrointestinal: Positive for heartburn.  Genitourinary: Negative.   Musculoskeletal: Positive for back pain and joint pain.  Skin: Negative.   Neurological: Positive for headaches.  Endo/Heme/Allergies: Negative.   Psychiatric/Behavioral: Negative.        Physical Exam  Constitutional: He is oriented to person, place, and time. He appears well-developed.  HENT:  Head: Normocephalic.  Eyes: Pupils are equal, round, and reactive to light.  Neck: No JVD present. No tracheal deviation present. No thyromegaly present.  Cardiovascular: Normal rate, regular rhythm and intact distal pulses.  Respiratory: Effort normal and breath sounds normal. No respiratory distress. He has no wheezes.  GI: Soft. There is no abdominal tenderness. There is no guarding.  Musculoskeletal:     Cervical back: Neck supple.      Left knee: Swelling, erythema and bony tenderness present. No lacerations (healed previous incision). Decreased range of motion. Tenderness present.     Comments: Venous insufficiency skin changes noted in his both lower extremities Due the size of his lower extremity is difficult to ascertain if there is any palpable effusion.  Lymphadenopathy:    He has no cervical adenopathy.  Neurological: He is alert and oriented to person, place, and time.  Skin: Skin is warm and dry.  Psychiatric: He has a normal mood and affect.       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 Alvan Dame at [facility]. Risks benefits and expectations were discussed with the patient. Patient understand risks, benefits and expectations and wishes to proceed.   Garrett Pugh Brunella Wileman   PA-C  04/02/2019, 8:54 PM

## 2019-04-02 NOTE — Progress Notes (Signed)
Anesthesia Chart Review   Case: [id number] Date/Time: 04/03/19 1415   Procedure: IRRIGATION AND DEBRIDEMENT KNEE and placement of articulating spacer (Left Knee) - need 120 min   Anesthesia type: Spinal   Pre-op diagnosis: Left infected total knee arthroplasty   Location: [facility]   Surgeons: Paralee Cancel, MD      DISCUSSION:69 y.o. never smoker with h/o hypothyroidism, GERD, sleep apnea w/CPAP, atrial fibrillation (on Xarelto), CHF (2014 LVEF 50-55%), left infected total knee arthroplasty scheduled for above procedure 04/03/19 with Dr. Paralee Cancel.   H/o prolonged emergence.    S/p resection of left knee arthroplasty hardware and placement of antibiotic spacer 09/21/2018, s/p repeat washout and placement of antibiotic spacer 01/02/2019.  No anesthesia complications noted.  Prior to the most recent procedure he was cleared by cardiology.    Per cardiologist, "Given past medical history and time since last visit, based on ACC/AHA guidelines,Levert Cira Rue be at acceptable risk for the planned procedure without further cardiovascular testing.  Per pharmacy, patient with diagnosis ofatrial fibrillationon Xareltofor anticoagulationwith aCHADS2-VASc score of4(CHF, AGE, stroke/tia x 2/DVT/PE)andCrCl127 ml/min Per office protocol, patient can holdXareltofor 3days prior to procedureand resume when safe per surgical team. Case reviewed by Dr. Sallyanne Kuster during pre-operative evaluation."  Pt confirms with PAT nurse he was advised to hole Xarelto 3 days prior to procedure.  Stable since this clearance.    Anticipate pt can proceed with planned procedure barring acute status change.   VS: BP (!) 152/82 (BP Location: Right Arm)   Pulse 77   Temp 36.6 C (Oral)   Resp 18   Ht 5\' 8"  (1.727 m)   Wt (!) 168.3 kg   SpO2 98%   BMI 56.42 kg/m   PROVIDERS: Bernerd Limbo, MD is PCP   Sanda Klein, MD is Cardiologist  LABS: Labs reviewed: Acceptable for surgery. (all  labs ordered are listed, but only abnormal results are displayed)  Labs Reviewed  BASIC METABOLIC PANEL - Abnormal; Notable for the following components:      Result Value   Glucose, Bld 127 (*)    All other components within normal limits  CBC - Abnormal; Notable for the following components:   RBC 3.64 (*)    Hemoglobin 10.5 (*)    HCT 35.1 (*)    MCHC 29.9 (*)    RDW 16.6 (*)    All other components within normal limits  SURGICAL PCR SCREEN  TYPE AND SCREEN     IMAGES:   EKG: 01/02/2019 Rate 95 bpm Atrial fibrillation  Left axis deviation   CV: 2014 TTE - EF 50% to 55%. LA was severely dilated. RV was mildly dilated. RA was moderately dilated.  Past Medical History:  Diagnosis Date  . CHF (congestive heart failure) ([place])   . Chronic anticoagulation    with Xarelto  . Complication of anesthesia    PT STATES HARD TO WAKE UP AFTER ONE SUGERY -STATES THE SURGERY TOOK LONGER THAN EXPECTED.  NO PROBLEMS WITH ANY OTHER SURGERY  . Complication of anesthesia    in 12/2018-states started convulsing after surgery, could not get warm   . Dysrhythmia    A-fib  . GERD (gastroesophageal reflux disease)   . Headache   . History of blood clots   . History of blood transfusion   . Hypothyroidism   . Pain    BACK PAIN - PT ATTRIBUTES TO THE WAY HE WALKS DUE TO LEFT KNEE PROBLEM  . Persistent atrial fibrillation ([place])   .  Septic arthritis of knee ([facility])    LEFT KNEE  . Shortness of breath    WITH EXERTION AND PAIN  . Sleep apnea    uses CPAP,    Past Surgical History:  Procedure Laterality Date  . 2010 REMOVAL OF LEFT TOTAL KNEE    . CARDIAC CATHETERIZATION    . CARDIOVERSION N/A 08/28/2012   Procedure: CARDIOVERSION;  Surgeon: Sanda Klein, MD;  Location: [facility];  Service: Cardiovascular;  Laterality: N/A;  . CARDIOVERSION N/A 09/01/2012   Procedure: CARDIOVERSION;  Surgeon: Pixie Casino, MD;  Location: [facility];  Service: Cardiovascular;  Laterality: N/A;  .  COLONOSCOPY N/A 06/13/2013   Procedure: COLONOSCOPY;  Surgeon: Inda Castle, MD;  [facility];  Service: Endoscopy;  Laterality: N/A;  . EXCISIONAL TOTAL KNEE ARTHROPLASTY WITH ANTIBIOTIC SPACERS Left 09/21/2018   Procedure: Resection of tibia versus both components with placement of antibiotic spacer;  Surgeon: Paralee Cancel, MD;  Location[facility];  Service: Orthopedics;  Laterality: Left;  2.5 hrs  . EXCISIONAL TOTAL KNEE ARTHROPLASTY WITH ANTIBIOTIC SPACERS Left 01/02/2019   Procedure: Repeat Washout and placement of antibiotic spacer left knee;  Surgeon: Paralee Cancel, MD;  [facility];  Service: Orthopedics;  Laterality: Left;  2 hrs  . HYDROCELECTOY   2012  . LEFT KNEE ARTHROSCOPY  1999  . LEFT KNEE SURGERY UPPER TIBIAL OSTOMY    . LEFT TOTAL KNEE REMOVAL FOR INFECTION  2006  . REIMPLANTATION LEFT TOTAL KNEE  2008  . REIMPLANTATION LEFT TOTAL KNEE   2006  . REIMPLANTATION OF TOTAL KNEE Left 06/23/2012   Procedure: REIMPLANTATION OF LEFT TOTAL KNEE;  Surgeon: Mauri Pole, MD;  [facility];  Service: Orthopedics;  Laterality: Left;  . REMOVAL LEFT TOTAL KNEE   2008  . REPLACEMENT LEFT KNEE  2002  . REPLACEMENT RIGHT KNEE  2003  . REVISION LEFT KNEE CAP   2004  . RIGHT KNEE ARTHROSCOPY  1998  . TEE WITHOUT CARDIOVERSION N/A 08/28/2012   Procedure: TRANSESOPHAGEAL ECHOCARDIOGRAM (TEE);  Surgeon: Sanda Klein, MD;  Location: [facility];  Service: Cardiovascular;  Laterality: N/A;  . TONSILLECTOMY    . TOTAL KNEE REVISION Left 12/15/2015   Procedure: TOTAL KNEE REVISION REPLACEMENT;  Surgeon: Paralee Cancel, MD;  [facility];  Service: Orthopedics;  Laterality: Left;  Adductor Block    MEDICATIONS: . diltiazem (CARDIZEM CD) 240 MG 24 hr capsule  . docusate sodium (COLACE) 100 MG capsule  . ferrous sulfate (FERROUSUL) 325 (65 FE) MG tablet  . levothyroxine (SYNTHROID) 125 MCG tablet  . methocarbamol (ROBAXIN) 500 MG tablet  . metoprolol tartrate (LOPRESSOR) 50 MG  tablet  . Multiple Vitamin (MULTIVITAMIN WITH MINERALS) TABS  . oxyCODONE (OXY IR/ROXICODONE) 5 MG immediate release tablet  . Oxycodone HCl 10 MG TABS  . polyethylene glycol (MIRALAX / GLYCOLAX) 17 g packet  . rivaroxaban (XARELTO) 20 MG TABS tablet   No current facility-administered medications for this encounter.   Derrill Memo ON 04/03/2019] ceFAZolin (ANCEF) 3 g in dextrose 5 % 50 mL IVPB   Maia Plan [facility] Pre-Surgical Testing [phone] 04/02/19  10:11 AM

## 2019-04-02 NOTE — Anesthesia Preprocedure Evaluation (Addendum)
Anesthesia Evaluation  Patient identified by MRN, date of birth, ID band Patient awake    Reviewed: Allergy & Precautions, H&P , NPO status , Patient's Chart, lab work & pertinent test results  Airway Mallampati: II   Neck ROM: full    Dental   Pulmonary shortness of breath, sleep apnea ,    breath sounds clear to auscultation       Cardiovascular +CHF  + dysrhythmias Atrial Fibrillation  Rhythm:regular Rate:Normal  Takes xarelto   Neuro/Psych  Headaches,    GI/Hepatic GERD  ,  Endo/Other  diabetes, Type 2Hypothyroidism Morbid obesity  Renal/GU      Musculoskeletal  (+) Arthritis ,   Abdominal   Peds  Hematology   Anesthesia Other Findings   Reproductive/Obstetrics                             Anesthesia Physical Anesthesia Plan  ASA: III  Anesthesia Plan: Spinal and MAC   Post-op Pain Management:  Regional for Post-op pain   Induction: Intravenous  PONV Risk Score and Plan: 1 and Propofol infusion, Midazolam, Ondansetron and Treatment may vary due to age or medical condition  Airway Management Planned: Simple Face Mask  Additional Equipment:   Intra-op Plan:   Post-operative Plan:   Informed Consent: I have reviewed the patients History and Physical, chart, labs and discussed the procedure including the risks, benefits and alternatives for the proposed anesthesia with the patient or authorized representative who has indicated his/her understanding and acceptance.       Plan Discussed with: Anesthesiologist, Surgeon and CRNA  Anesthesia Plan Comments: (See PAT note 03/30/2019, Konrad Felix, PA-C)       Anesthesia Quick Evaluation

## 2019-04-03 ENCOUNTER — Encounter (HOSPITAL_COMMUNITY): Payer: Self-pay | Admitting: Orthopedic Surgery

## 2019-04-03 ENCOUNTER — Other Ambulatory Visit: Payer: Self-pay

## 2019-04-03 ENCOUNTER — Ambulatory Visit: Payer: Self-pay

## 2019-04-03 ENCOUNTER — Encounter (HOSPITAL_COMMUNITY)
Admission: AD | Disposition: A | Discharge status: Home Health | Payer: Self-pay | Source: Home / Self Care | Attending: Orthopedic Surgery

## 2019-04-03 ENCOUNTER — Ambulatory Visit (HOSPITAL_COMMUNITY): Payer: Medicare Other | Admitting: Physician Assistant

## 2019-04-03 ENCOUNTER — Inpatient Hospital Stay (HOSPITAL_COMMUNITY)
Admission: AD | Admit: 2019-04-03 | Discharge: 2019-04-06 | DRG: 467 | Disposition: A | Discharge status: Home Health | Payer: Medicare Other | Attending: Orthopedic Surgery | Admitting: Orthopedic Surgery

## 2019-04-03 ENCOUNTER — Ambulatory Visit (HOSPITAL_COMMUNITY): Payer: Medicare Other | Admitting: Anesthesiology

## 2019-04-03 DIAGNOSIS — S82109A Unspecified fracture of upper end of unspecified tibia, initial encounter for closed fracture: Secondary | ICD-10-CM | POA: Diagnosis present

## 2019-04-03 DIAGNOSIS — Z79890 Hormone replacement therapy (postmenopausal): Secondary | ICD-10-CM

## 2019-04-03 DIAGNOSIS — T8454XA Infection and inflammatory reaction due to internal left knee prosthesis, initial encounter: Principal | ICD-10-CM | POA: Diagnosis present

## 2019-04-03 DIAGNOSIS — Z6841 Body Mass Index (BMI) 40.0 and over, adult: Secondary | ICD-10-CM

## 2019-04-03 DIAGNOSIS — Z86718 Personal history of other venous thrombosis and embolism: Secondary | ICD-10-CM

## 2019-04-03 DIAGNOSIS — T8459XA Infection and inflammatory reaction due to other internal joint prosthesis, initial encounter: Secondary | ICD-10-CM

## 2019-04-03 DIAGNOSIS — Z96659 Presence of unspecified artificial knee joint: Secondary | ICD-10-CM

## 2019-04-03 DIAGNOSIS — Z7901 Long term (current) use of anticoagulants: Secondary | ICD-10-CM

## 2019-04-03 DIAGNOSIS — K219 Gastro-esophageal reflux disease without esophagitis: Secondary | ICD-10-CM | POA: Diagnosis present

## 2019-04-03 DIAGNOSIS — Y831 Surgical operation with implant of artificial internal device as the cause of abnormal reaction of the patient, or of later complication, without mention of misadventure at the time of the procedure: Secondary | ICD-10-CM | POA: Diagnosis present

## 2019-04-03 DIAGNOSIS — Z96652 Presence of left artificial knee joint: Secondary | ICD-10-CM

## 2019-04-03 DIAGNOSIS — Z79891 Long term (current) use of opiate analgesic: Secondary | ICD-10-CM

## 2019-04-03 DIAGNOSIS — Z79899 Other long term (current) drug therapy: Secondary | ICD-10-CM

## 2019-04-03 DIAGNOSIS — I509 Heart failure, unspecified: Secondary | ICD-10-CM | POA: Diagnosis present

## 2019-04-03 DIAGNOSIS — I4819 Other persistent atrial fibrillation: Secondary | ICD-10-CM | POA: Diagnosis present

## 2019-04-03 DIAGNOSIS — Z89529 Acquired absence of unspecified knee: Secondary | ICD-10-CM | POA: Diagnosis present

## 2019-04-03 DIAGNOSIS — E039 Hypothyroidism, unspecified: Secondary | ICD-10-CM | POA: Diagnosis present

## 2019-04-03 DIAGNOSIS — M549 Dorsalgia, unspecified: Secondary | ICD-10-CM | POA: Diagnosis present

## 2019-04-03 DIAGNOSIS — Z20822 Contact with and (suspected) exposure to covid-19: Secondary | ICD-10-CM | POA: Diagnosis present

## 2019-04-03 HISTORY — PX: IRRIGATION AND DEBRIDEMENT KNEE: SHX5185

## 2019-04-03 LAB — TYPE AND SCREEN
ABO/RH(D): O POS
Antibody Screen: NEGATIVE

## 2019-04-03 SURGERY — IRRIGATION AND DEBRIDEMENT KNEE
Anesthesia: Regional | Site: Knee | Laterality: Left

## 2019-04-03 MED ORDER — BUPIVACAINE HCL 0.25 % IJ SOLN
INTRAMUSCULAR | Status: AC
  Filled 2019-04-03: qty 1

## 2019-04-03 MED ORDER — CEFAZOLIN SODIUM-DEXTROSE 2-4 GM/100ML-% IV SOLN
2.0000 g | Freq: Four times a day (QID) | INTRAVENOUS | Status: AC
  Administered 2019-04-03 – 2019-04-04 (×2): 2 g via INTRAVENOUS
  Filled 2019-04-03 (×2): qty 100

## 2019-04-03 MED ORDER — VANCOMYCIN HCL 1000 MG IV SOLR
INTRAVENOUS | Status: DC | PRN
  Administered 2019-04-03: 1000 mg via TOPICAL

## 2019-04-03 MED ORDER — FENTANYL CITRATE (PF) 100 MCG/2ML IJ SOLN
INTRAMUSCULAR | Status: AC
  Filled 2019-04-03: qty 2

## 2019-04-03 MED ORDER — METOPROLOL TARTRATE 50 MG PO TABS
50.0000 mg | ORAL_TABLET | Freq: Two times a day (BID) | ORAL | Status: DC
  Administered 2019-04-03 – 2019-04-06 (×6): 50 mg via ORAL
  Filled 2019-04-03 (×6): qty 1

## 2019-04-03 MED ORDER — RIVAROXABAN 10 MG PO TABS
10.0000 mg | ORAL_TABLET | ORAL | Status: DC
  Administered 2019-04-04 – 2019-04-06 (×3): 10 mg via ORAL
  Filled 2019-04-03 (×3): qty 1

## 2019-04-03 MED ORDER — ROPIVACAINE HCL 7.5 MG/ML IJ SOLN
INTRAMUSCULAR | Status: DC | PRN
  Administered 2019-04-03: 10 mL via PERINEURAL

## 2019-04-03 MED ORDER — FENTANYL CITRATE (PF) 100 MCG/2ML IJ SOLN
25.0000 ug | INTRAMUSCULAR | Status: DC | PRN
  Administered 2019-04-03 (×4): 50 ug via INTRAVENOUS

## 2019-04-03 MED ORDER — PHENYLEPHRINE 40 MCG/ML (10ML) SYRINGE FOR IV PUSH (FOR BLOOD PRESSURE SUPPORT)
PREFILLED_SYRINGE | INTRAVENOUS | Status: AC
  Filled 2019-04-03: qty 10

## 2019-04-03 MED ORDER — LEVOTHYROXINE SODIUM 125 MCG PO TABS
125.0000 ug | ORAL_TABLET | Freq: Every day | ORAL | Status: DC
  Administered 2019-04-04 – 2019-04-06 (×3): 125 ug via ORAL
  Filled 2019-04-03 (×3): qty 1

## 2019-04-03 MED ORDER — DEXAMETHASONE SODIUM PHOSPHATE 10 MG/ML IJ SOLN
INTRAMUSCULAR | Status: AC
  Filled 2019-04-03: qty 1

## 2019-04-03 MED ORDER — PHENOL 1.4 % MT LIQD
1.0000 | OROMUCOSAL | Status: DC | PRN
  Filled 2019-04-03: qty 177

## 2019-04-03 MED ORDER — VANCOMYCIN HCL 1000 MG IV SOLR
INTRAVENOUS | Status: AC
  Filled 2019-04-03: qty 1000

## 2019-04-03 MED ORDER — PHENYLEPHRINE 40 MCG/ML (10ML) SYRINGE FOR IV PUSH (FOR BLOOD PRESSURE SUPPORT)
PREFILLED_SYRINGE | INTRAVENOUS | Status: AC
  Filled 2019-04-03: qty 20

## 2019-04-03 MED ORDER — MAGNESIUM CITRATE PO SOLN: 1.0000 | Freq: Once | ORAL | Status: DC | PRN

## 2019-04-03 MED ORDER — STERILE WATER FOR IRRIGATION IR SOLN
Status: DC | PRN
  Administered 2019-04-03: 2000 mL

## 2019-04-03 MED ORDER — FENTANYL CITRATE (PF) 250 MCG/5ML IJ SOLN
INTRAMUSCULAR | Status: AC
  Filled 2019-04-03: qty 5

## 2019-04-03 MED ORDER — SUGAMMADEX SODIUM 500 MG/5ML IV SOLN
INTRAVENOUS | Status: AC
  Filled 2019-04-03: qty 5

## 2019-04-03 MED ORDER — PHENYLEPHRINE 40 MCG/ML (10ML) SYRINGE FOR IV PUSH (FOR BLOOD PRESSURE SUPPORT)
PREFILLED_SYRINGE | INTRAVENOUS | Status: DC | PRN
  Administered 2019-04-03: 40 ug via INTRAVENOUS
  Administered 2019-04-03: 80 ug via INTRAVENOUS
  Administered 2019-04-03: 120 ug via INTRAVENOUS
  Administered 2019-04-03: 40 ug via INTRAVENOUS
  Administered 2019-04-03: 120 ug via INTRAVENOUS

## 2019-04-03 MED ORDER — VANCOMYCIN HCL 1250 MG/250ML IV SOLN
1250.0000 mg | Freq: Two times a day (BID) | INTRAVENOUS | Status: DC
  Administered 2019-04-04: 1250 mg via INTRAVENOUS
  Filled 2019-04-03: qty 250

## 2019-04-03 MED ORDER — BISACODYL 10 MG RE SUPP: 10.0000 mg | Freq: Every day | RECTAL | Status: DC | PRN

## 2019-04-03 MED ORDER — POVIDONE-IODINE 10 % EX SWAB
2.0000 "application " | Freq: Once | CUTANEOUS | Status: AC
  Administered 2019-04-03: 2 via TOPICAL

## 2019-04-03 MED ORDER — DIPHENHYDRAMINE HCL 12.5 MG/5ML PO ELIX: 12.5000 mg | ORAL_SOLUTION | ORAL | Status: DC | PRN

## 2019-04-03 MED ORDER — ALBUMIN HUMAN 5 % IV SOLN
INTRAVENOUS | Status: AC
  Filled 2019-04-03: qty 250

## 2019-04-03 MED ORDER — PHENYLEPHRINE HCL (PRESSORS) 10 MG/ML IV SOLN
INTRAVENOUS | Status: AC
  Filled 2019-04-03: qty 1

## 2019-04-03 MED ORDER — DOCUSATE SODIUM 100 MG PO CAPS
100.0000 mg | ORAL_CAPSULE | Freq: Two times a day (BID) | ORAL | Status: DC
  Administered 2019-04-03 – 2019-04-05 (×4): 100 mg via ORAL
  Filled 2019-04-03 (×5): qty 1

## 2019-04-03 MED ORDER — ONDANSETRON HCL 4 MG/2ML IJ SOLN
INTRAMUSCULAR | Status: AC
  Filled 2019-04-03: qty 2

## 2019-04-03 MED ORDER — CHLORHEXIDINE GLUCONATE 4 % EX LIQD: 60.0000 mL | Freq: Once | CUTANEOUS | Status: DC

## 2019-04-03 MED ORDER — ONDANSETRON HCL 4 MG/2ML IJ SOLN
INTRAMUSCULAR | Status: DC | PRN
  Administered 2019-04-03: 4 mg via INTRAVENOUS

## 2019-04-03 MED ORDER — FENTANYL CITRATE (PF) 100 MCG/2ML IJ SOLN
50.0000 ug | INTRAMUSCULAR | Status: DC
  Administered 2019-04-03: 50 ug via INTRAVENOUS

## 2019-04-03 MED ORDER — METOCLOPRAMIDE HCL 5 MG/ML IJ SOLN: 5.0000 mg | Freq: Three times a day (TID) | INTRAMUSCULAR | Status: DC | PRN

## 2019-04-03 MED ORDER — LACTATED RINGERS IV SOLN: INTRAVENOUS | Status: DC

## 2019-04-03 MED ORDER — METOCLOPRAMIDE HCL 5 MG PO TABS: 5.0000 mg | ORAL_TABLET | Freq: Three times a day (TID) | ORAL | Status: DC | PRN

## 2019-04-03 MED ORDER — PROPOFOL 10 MG/ML IV BOLUS
INTRAVENOUS | Status: DC | PRN
  Administered 2019-04-03: 200 mg via INTRAVENOUS
  Administered 2019-04-03: 50 mg via INTRAVENOUS

## 2019-04-03 MED ORDER — ACETAMINOPHEN 500 MG PO TABS
1000.0000 mg | ORAL_TABLET | Freq: Four times a day (QID) | ORAL | Status: AC
  Administered 2019-04-03 – 2019-04-04 (×4): 1000 mg via ORAL
  Filled 2019-04-03 (×4): qty 2

## 2019-04-03 MED ORDER — FENTANYL CITRATE (PF) 100 MCG/2ML IJ SOLN
INTRAMUSCULAR | Status: DC | PRN
  Administered 2019-04-03 (×5): 50 ug via INTRAVENOUS
  Administered 2019-04-03 (×2): 100 ug via INTRAVENOUS
  Administered 2019-04-03 (×2): 50 ug via INTRAVENOUS

## 2019-04-03 MED ORDER — SUCCINYLCHOLINE CHLORIDE 20 MG/ML IJ SOLN
INTRAMUSCULAR | Status: DC | PRN
  Administered 2019-04-03: 160 mg via INTRAVENOUS

## 2019-04-03 MED ORDER — ONDANSETRON HCL 4 MG PO TABS: 4.0000 mg | ORAL_TABLET | Freq: Four times a day (QID) | ORAL | Status: DC | PRN

## 2019-04-03 MED ORDER — DEXAMETHASONE SODIUM PHOSPHATE 10 MG/ML IJ SOLN
INTRAMUSCULAR | Status: DC | PRN
  Administered 2019-04-03: 10 mg via INTRAVENOUS

## 2019-04-03 MED ORDER — IRRISEPT - 450ML BOTTLE WITH 0.05% CHG IN STERILE WATER, USP 99.95% OPTIME
TOPICAL | Status: DC | PRN
  Administered 2019-04-03: 450 mL

## 2019-04-03 MED ORDER — HYDROMORPHONE HCL 1 MG/ML IJ SOLN
0.5000 mg | INTRAMUSCULAR | Status: DC | PRN
  Administered 2019-04-03: 1 mg via INTRAVENOUS
  Filled 2019-04-03: qty 1

## 2019-04-03 MED ORDER — METHOCARBAMOL 1000 MG/10ML IJ SOLN
500.0000 mg | Freq: Four times a day (QID) | INTRAVENOUS | Status: DC | PRN
  Filled 2019-04-03: qty 5

## 2019-04-03 MED ORDER — OXYCODONE HCL 5 MG PO TABS
5.0000 mg | ORAL_TABLET | Freq: Once | ORAL | Status: AC | PRN
  Administered 2019-04-03: 5 mg via ORAL

## 2019-04-03 MED ORDER — KETOROLAC TROMETHAMINE 30 MG/ML IJ SOLN
INTRAMUSCULAR | Status: AC
  Filled 2019-04-03: qty 1

## 2019-04-03 MED ORDER — MIDAZOLAM HCL 2 MG/2ML IJ SOLN
1.0000 mg | INTRAMUSCULAR | Status: DC
  Administered 2019-04-03: 1 mg via INTRAVENOUS

## 2019-04-03 MED ORDER — OXYCODONE HCL 5 MG PO TABS
ORAL_TABLET | ORAL | Status: AC
  Filled 2019-04-03: qty 1

## 2019-04-03 MED ORDER — VANCOMYCIN HCL 1000 MG IV SOLR
INTRAVENOUS | Status: AC
  Filled 2019-04-03: qty 3000

## 2019-04-03 MED ORDER — SUCCINYLCHOLINE CHLORIDE 200 MG/10ML IV SOSY
PREFILLED_SYRINGE | INTRAVENOUS | Status: AC
  Filled 2019-04-03: qty 10

## 2019-04-03 MED ORDER — KETAMINE HCL 10 MG/ML IJ SOLN
INTRAMUSCULAR | Status: DC | PRN
  Administered 2019-04-03 (×2): 10 mg via INTRAVENOUS
  Administered 2019-04-03: 80 mg via INTRAVENOUS
  Administered 2019-04-03: 10 mg via INTRAVENOUS

## 2019-04-03 MED ORDER — ONDANSETRON HCL 4 MG/2ML IJ SOLN: 4.0000 mg | Freq: Four times a day (QID) | INTRAMUSCULAR | Status: DC | PRN

## 2019-04-03 MED ORDER — OXYCODONE HCL 5 MG PO TABS
5.0000 mg | ORAL_TABLET | ORAL | Status: DC | PRN
  Administered 2019-04-04 – 2019-04-06 (×5): 10 mg via ORAL
  Filled 2019-04-03 (×5): qty 2

## 2019-04-03 MED ORDER — SODIUM CHLORIDE (PF) 0.9 % IJ SOLN
INTRAMUSCULAR | Status: AC
  Filled 2019-04-03: qty 50

## 2019-04-03 MED ORDER — METHOCARBAMOL 500 MG PO TABS
500.0000 mg | ORAL_TABLET | Freq: Four times a day (QID) | ORAL | Status: DC | PRN
  Administered 2019-04-04: 500 mg via ORAL
  Filled 2019-04-03: qty 1

## 2019-04-03 MED ORDER — MENTHOL 3 MG MT LOZG: 1.0000 | LOZENGE | OROMUCOSAL | Status: DC | PRN

## 2019-04-03 MED ORDER — VANCOMYCIN HCL 1000 MG IV SOLR
INTRAVENOUS | Status: DC | PRN
  Administered 2019-04-03: 3 g

## 2019-04-03 MED ORDER — OXYCODONE HCL 5 MG PO TABS
10.0000 mg | ORAL_TABLET | ORAL | Status: DC | PRN
  Administered 2019-04-05 (×2): 15 mg via ORAL
  Filled 2019-04-03 (×2): qty 3

## 2019-04-03 MED ORDER — ALUM & MAG HYDROXIDE-SIMETH 200-200-20 MG/5ML PO SUSP: 15.0000 mL | ORAL | Status: DC | PRN

## 2019-04-03 MED ORDER — DILTIAZEM HCL ER COATED BEADS 240 MG PO CP24
240.0000 mg | ORAL_CAPSULE | Freq: Every day | ORAL | Status: DC
  Administered 2019-04-04 – 2019-04-06 (×3): 240 mg via ORAL
  Filled 2019-04-03 (×3): qty 1

## 2019-04-03 MED ORDER — FERROUS SULFATE 325 (65 FE) MG PO TABS
325.0000 mg | ORAL_TABLET | Freq: Two times a day (BID) | ORAL | Status: DC
  Administered 2019-04-04 – 2019-04-06 (×5): 325 mg via ORAL
  Filled 2019-04-03 (×4): qty 1

## 2019-04-03 MED ORDER — POLYETHYLENE GLYCOL 3350 17 G PO PACK
17.0000 g | PACK | Freq: Two times a day (BID) | ORAL | Status: DC
  Administered 2019-04-03 – 2019-04-04 (×3): 17 g via ORAL
  Filled 2019-04-03 (×5): qty 1

## 2019-04-03 MED ORDER — ALBUMIN HUMAN 5 % IV SOLN: INTRAVENOUS | Status: DC | PRN

## 2019-04-03 MED ORDER — ACETAMINOPHEN 325 MG PO TABS: 325.0000 mg | ORAL_TABLET | Freq: Four times a day (QID) | ORAL | Status: DC | PRN

## 2019-04-03 MED ORDER — MIDAZOLAM HCL 2 MG/2ML IJ SOLN
INTRAMUSCULAR | Status: AC
  Filled 2019-04-03: qty 2

## 2019-04-03 MED ORDER — SUGAMMADEX SODIUM 500 MG/5ML IV SOLN
INTRAVENOUS | Status: DC | PRN
  Administered 2019-04-03: 400 mg via INTRAVENOUS

## 2019-04-03 MED ORDER — PROPOFOL 1000 MG/100ML IV EMUL
INTRAVENOUS | Status: AC
  Filled 2019-04-03: qty 100

## 2019-04-03 MED ORDER — VANCOMYCIN HCL 2000 MG/400ML IV SOLN
2000.0000 mg | Freq: Once | INTRAVENOUS | Status: AC
  Administered 2019-04-03: 2000 mg via INTRAVENOUS
  Filled 2019-04-03: qty 400

## 2019-04-03 MED ORDER — ROCURONIUM BROMIDE 10 MG/ML (PF) SYRINGE
PREFILLED_SYRINGE | INTRAVENOUS | Status: DC | PRN
  Administered 2019-04-03: 70 mg via INTRAVENOUS

## 2019-04-03 MED ORDER — DEXAMETHASONE SODIUM PHOSPHATE 10 MG/ML IJ SOLN
10.0000 mg | Freq: Once | INTRAMUSCULAR | Status: AC
  Administered 2019-04-04: 10 mg via INTRAVENOUS
  Filled 2019-04-03: qty 1

## 2019-04-03 MED ORDER — TRANEXAMIC ACID-NACL 1000-0.7 MG/100ML-% IV SOLN
1000.0000 mg | INTRAVENOUS | Status: AC
  Administered 2019-04-03: 1000 mg via INTRAVENOUS
  Filled 2019-04-03: qty 100

## 2019-04-03 MED ORDER — DEXAMETHASONE SODIUM PHOSPHATE 10 MG/ML IJ SOLN: 10.0000 mg | Freq: Once | INTRAMUSCULAR | Status: DC

## 2019-04-03 MED ORDER — TOBRAMYCIN SULFATE 1.2 G IJ SOLR
INTRAMUSCULAR | Status: DC | PRN
  Administered 2019-04-03: 3.6 g

## 2019-04-03 MED ORDER — OXYCODONE HCL 5 MG/5ML PO SOLN: 5.0000 mg | Freq: Once | ORAL | Status: AC | PRN

## 2019-04-03 MED ORDER — TRANEXAMIC ACID-NACL 1000-0.7 MG/100ML-% IV SOLN
1000.0000 mg | Freq: Once | INTRAVENOUS | Status: AC
  Administered 2019-04-03: 22:00:00 1000 mg via INTRAVENOUS
  Filled 2019-04-03: qty 100

## 2019-04-03 MED ORDER — KETAMINE HCL 10 MG/ML IJ SOLN
INTRAMUSCULAR | Status: AC
  Filled 2019-04-03: qty 1

## 2019-04-03 MED ORDER — SODIUM CHLORIDE 0.9 % IV SOLN: INTRAVENOUS | Status: DC

## 2019-04-03 MED ORDER — ROCURONIUM BROMIDE 10 MG/ML (PF) SYRINGE
PREFILLED_SYRINGE | INTRAVENOUS | Status: AC
  Filled 2019-04-03: qty 10

## 2019-04-03 MED ORDER — TOBRAMYCIN SULFATE 1.2 G IJ SOLR
INTRAMUSCULAR | Status: AC
  Filled 2019-04-03: qty 3.6

## 2019-04-03 MED ORDER — SODIUM CHLORIDE 0.9 % IR SOLN
Status: DC | PRN
  Administered 2019-04-03: 7000 mL

## 2019-04-03 MED ORDER — PHENYLEPHRINE HCL-NACL 10-0.9 MG/250ML-% IV SOLN
INTRAVENOUS | Status: DC | PRN
  Administered 2019-04-03: 50 ug/min via INTRAVENOUS

## 2019-04-03 MED ORDER — MIDAZOLAM HCL 5 MG/5ML IJ SOLN
INTRAMUSCULAR | Status: DC | PRN
  Administered 2019-04-03 (×2): 1 mg via INTRAVENOUS

## 2019-04-03 SURGICAL SUPPLY — 73 items
BAG ZIPLOCK 12X15 (MISCELLANEOUS) ×2 IMPLANT
BANDAGE ESMARK 6X9 LF (GAUZE/BANDAGES/DRESSINGS) ×1 IMPLANT
BLADE SAW SGTL 11.0X1.19X90.0M (BLADE) IMPLANT
BNDG CMPR MED 10X6 ELC LF (GAUZE/BANDAGES/DRESSINGS) ×1
BNDG ELASTIC 6X10 VLCR STRL LF (GAUZE/BANDAGES/DRESSINGS) ×1 IMPLANT
BNDG ELASTIC 6X5.8 VLCR STR LF (GAUZE/BANDAGES/DRESSINGS) ×2 IMPLANT
BNDG ESMARK 6X9 LF (GAUZE/BANDAGES/DRESSINGS) ×2
BOWL SMART MIX CTS (DISPOSABLE) IMPLANT
BRUSH FEMORAL CANAL (MISCELLANEOUS) ×1 IMPLANT
CEMENT HV SMART SET (Cement) ×7 IMPLANT
COVER BACK TABLE 60X90IN (DRAPES) ×1 IMPLANT
COVER SURGICAL LIGHT HANDLE (MISCELLANEOUS) ×2 IMPLANT
COVER WAND RF STERILE (DRAPES) IMPLANT
CUFF TOURN SGL QUICK 34 (TOURNIQUET CUFF) ×1
CUFF TRNQT CYL 34X4.125X (TOURNIQUET CUFF) ×1 IMPLANT
DRAPE EXTREMITY T 121X128X90 (DISPOSABLE) ×1 IMPLANT
DRAPE ORTHO SPLIT 77X108 STRL (DRAPES) ×2
DRAPE POUCH INSTRU U-SHP 10X18 (DRAPES) ×2 IMPLANT
DRAPE SHEET LG 3/4 BI-LAMINATE (DRAPES) ×2 IMPLANT
DRAPE SURG ORHT 6 SPLT 77X108 (DRAPES) IMPLANT
DRAPE U-SHAPE 47X51 STRL (DRAPES) ×2 IMPLANT
DRSG ADAPTIC 3X8 NADH LF (GAUZE/BANDAGES/DRESSINGS) ×2 IMPLANT
DRSG CURAD 3X16 NADH (PACKING) ×1 IMPLANT
DRSG PAD ABDOMINAL 8X10 ST (GAUZE/BANDAGES/DRESSINGS) ×2 IMPLANT
DURAPREP 26ML APPLICATOR (WOUND CARE) ×2 IMPLANT
ELECT REM PT RETURN 15FT ADLT (MISCELLANEOUS) ×2 IMPLANT
EVACUATOR 1/8 PVC DRAIN (DRAIN) ×2 IMPLANT
FACESHIELD WRAPAROUND (MASK) ×10 IMPLANT
FACESHIELD WRAPAROUND OR TEAM (MASK) ×5 IMPLANT
GAUZE SPONGE 4X4 12PLY STRL (GAUZE/BANDAGES/DRESSINGS) ×4 IMPLANT
GLOVE BIOGEL M 7.0 STRL (GLOVE) IMPLANT
GLOVE BIOGEL PI IND STRL 7.5 (GLOVE) ×1 IMPLANT
GLOVE BIOGEL PI IND STRL 8.5 (GLOVE) ×2 IMPLANT
GLOVE BIOGEL PI INDICATOR 7.5 (GLOVE) ×1
GLOVE BIOGEL PI INDICATOR 8.5 (GLOVE) ×2
GLOVE ECLIPSE 8.0 STRL XLNG CF (GLOVE) ×2 IMPLANT
GLOVE ORTHO TXT STRL SZ7.5 (GLOVE) ×4 IMPLANT
GOWN STRL REUS W/TWL LRG LVL3 (GOWN DISPOSABLE) ×2 IMPLANT
GOWN STRL REUS W/TWL XL LVL3 (GOWN DISPOSABLE) ×4 IMPLANT
HANDPIECE INTERPULSE COAX TIP (DISPOSABLE) ×1
IMMOBILIZER KNEE 20 (SOFTGOODS)
IMMOBILIZER KNEE 20 THIGH 36 (SOFTGOODS) IMPLANT
IMMOBILIZER KNEE 22 UNIV (SOFTGOODS) ×1 IMPLANT
INSERT TIB TC3 RP SZ5 20 ×1 IMPLANT
KIT BASIN OR (CUSTOM PROCEDURE TRAY) ×2 IMPLANT
KIT TURNOVER KIT A (KITS) IMPLANT
MANIFOLD NEPTUNE II (INSTRUMENTS) ×2 IMPLANT
NDL SAFETY ECLIPSE 18X1.5 (NEEDLE) ×1 IMPLANT
NEEDLE HYPO 18GX1.5 SHARP (NEEDLE) ×1
NS IRRIG 1000ML POUR BTL (IV SOLUTION) ×2 IMPLANT
PACK TOTAL JOINT (CUSTOM PROCEDURE TRAY) ×2 IMPLANT
PADDING CAST COTTON 6X4 STRL (CAST SUPPLIES) ×3 IMPLANT
PENCIL SMOKE EVACUATOR (MISCELLANEOUS) IMPLANT
PROTECTOR NERVE ULNAR (MISCELLANEOUS) ×2 IMPLANT
SET HNDPC FAN SPRY TIP SCT (DISPOSABLE) ×1 IMPLANT
SET PAD KNEE POSITIONER (MISCELLANEOUS) ×2 IMPLANT
STAPLER VISISTAT 35W (STAPLE) IMPLANT
STEM  REV  115X14 (Stem) ×1 IMPLANT
STEM REV 115X14 (Stem) IMPLANT
SUCTION FRAZIER HANDLE 12FR (TUBING) ×1
SUCTION TUBE FRAZIER 12FR DISP (TUBING) ×1 IMPLANT
SUT MNCRL AB 3-0 PS2 18 (SUTURE) ×1 IMPLANT
SUT STRATAFIX PDS+ 0 24IN (SUTURE) ×2 IMPLANT
SUT VIC AB 1 CT1 36 (SUTURE) ×6 IMPLANT
SUT VIC AB 2-0 CT1 27 (SUTURE) ×3
SUT VIC AB 2-0 CT1 TAPERPNT 27 (SUTURE) ×3 IMPLANT
SYR 50ML LL SCALE MARK (SYRINGE) ×2 IMPLANT
TOWEL OR 17X26 10 PK STRL BLUE (TOWEL DISPOSABLE) ×4 IMPLANT
TRAY FOLEY MTR SLVR 16FR STAT (SET/KITS/TRAYS/PACK) ×2 IMPLANT
TRAY REVISION SZ 4 (Knees) ×1 IMPLANT
TRAY SLEEVE POROUS 61 ×1 IMPLANT
WATER STERILE IRR 1000ML POUR (IV SOLUTION) ×2 IMPLANT
WRAP KNEE MAXI GEL POST OP (GAUZE/BANDAGES/DRESSINGS) ×2 IMPLANT

## 2019-04-03 NOTE — Anesthesia Procedure Notes (Signed)
Date/Time: 04/03/2019 2:06 PM Performed by: Sharlette Dense, CRNA Oxygen Delivery Method: Simple face mask

## 2019-04-03 NOTE — Progress Notes (Signed)
AssistedDr. Marcie Bal with left, ultrasound guided, adductor canal block. Side rails up, monitors on throughout procedure. See vital signs in flow sheet. Tolerated Procedure well.

## 2019-04-03 NOTE — Interval H&P Note (Signed)
History and Physical Interval Note:  04/03/2019 12:55 PM  [name]  has presented today for surgery, with the diagnosis of Left infected total knee arthroplasty.  The various methods of treatment have been discussed with the patient and family. After consideration of risks, benefits and other options for treatment, the patient has consented to  Procedure(s) with comments: IRRIGATION AND DEBRIDEMENT KNEE and placement of articulating spacer (Left) - need 120 min as a surgical intervention.  The patient's history has been reviewed, patient examined, no change in status, stable for surgery.  I have reviewed the patient's chart and labs.  Questions were answered to the patient's satisfaction.     Mauri Pole

## 2019-04-03 NOTE — Progress Notes (Signed)
Pharmacy Antibiotic Note  Matteus Street is a 69 y.o. male admitted on 04/03/2019 with infected left TKA.  Pharmacy has been consulted for vancomycin dosing.  Pt has persistent infection of left knee; underwent I&D with placement of antibiotic spacer ~13 weeks ago. Pt taken to OR today for repeat I&D and replacement of antibiotic spacer. Pharmacy consulted to dose vancomycin for PJI.  Patient had vancomycin powder placed in antibiotic spacer, no IV vancomycin documented during surgery.  Today, 04/03/19  SCr 1.12, CrCl > 80 mL/min  Afebrile  TBW = 169.2 kg  Plan:  Vancomycin 2000 mg LD followed by 1250 mg IV q12h  Goal vancomycin AUC 400-550   Follow renal function closely, check vancomycin levels once at steady state if indicated  Height: 5\' 8"  (172.7 cm) Weight: (!) 373 lb (169.2 kg) IBW/kg (Calculated) : 68.4  Temp (24hrs), Avg:98 F (36.7 C), Min:98 F (36.7 C), Max:98 F (36.7 C)  Recent Labs  Lab 03/30/19 1009  WBC 4.0  CREATININE 1.12    Estimated Creatinine Clearance: 97.1 mL/min (by C-G formula based on SCr of 1.12 mg/dL).    Allergies  Allergen Reactions  . Morphine Hives   Antimicrobials this admission: vancomycin 2/23 >>  Cefazolin pre-op x1 2/23  Powder tobramycin + vancomycin in ABX spacer  Dose adjustments this admission:  Microbiology results: 2/23 Wound:   Lenis Noon, PharmD 04/03/2019 6:44 PM

## 2019-04-03 NOTE — Brief Op Note (Signed)
04/03/2019  2:26 PM  PATIENT:  [name]  69 y.o. male  PRE-OPERATIVE DIAGNOSIS:  Left infected total knee arthroplasty  POST-OPERATIVE DIAGNOSIS:  Persistent/recurrent left infected total knee arthroplasty  PROCEDURE:  Procedure(s) with comments: IRRIGATION AND DEBRIDEMENT KNEE and placement of articulating spacer (Left) - need 120 min  SURGEON:  Surgeon(s) and Role:    Paralee Cancel, MD - Primary  PHYSICIAN ASSISTANT: Danae Orleans, PA-C  ANESTHESIA:   general  EBL:  <200 cc  BLOOD ADMINISTERED:none  DRAINS: none   LOCAL MEDICATIONS USED:  NONE  SPECIMEN:  Source of Specimen:  left knee synovial fluid  DISPOSITION OF SPECIMEN:  PATHOLOGY  COUNTS:  YES  TOURNIQUET:  84 min at 250 mmHg  DICTATION: .Other Dictation: Dictation Number [id number]  PLAN OF CARE: Admit to inpatient   PATIENT DISPOSITION:  PACU - hemodynamically stable.   Delay start of Pharmacological VTE agent (>24hrs) due to surgical blood loss or risk of bleeding: no

## 2019-04-03 NOTE — Discharge Instructions (Signed)
INSTRUCTIONS AFTER JOINT REPLACEMENT   o Remove items at home which could result in a fall. This includes throw rugs or furniture in walking pathways o ICE to the affected joint every three hours while awake for 30 minutes at a time, for at least the first 3-5 days, and then as needed for pain and swelling.  Continue to use ice for pain and swelling. You may notice swelling that will progress down to the foot and ankle.  This is normal after surgery.  Elevate your leg when you are not up walking on it.   o Continue to use the breathing machine you got in the hospital (incentive spirometer) which will help keep your temperature down.  It is common for your temperature to cycle up and down following surgery, especially at night when you are not up moving around and exerting yourself.  The breathing machine keeps your lungs expanded and your temperature down.   DIET:  As you were doing prior to hospitalization, we recommend a well-balanced diet.  DRESSING / WOUND CARE / SHOWERING  Keep the surgical dressing until follow up.  The dressing is water proof, so you can shower without any extra covering.  IF THE DRESSING FALLS OFF or the wound gets wet inside, change the dressing with sterile gauze.  Please use good hand washing techniques before changing the dressing.  Do not use any lotions or creams on the incision until instructed by your surgeon.    ACTIVITY  o Increase activity slowly as tolerated, but follow the weight bearing instructions below.   o No driving for 6 weeks or until further direction given by your physician.  You cannot drive while taking narcotics.  o No lifting or carrying greater than 10 lbs. until further directed by your surgeon. o Avoid periods of inactivity such as sitting longer than an hour when not asleep. This helps prevent blood clots.  o You may return to work once you are authorized by your doctor.     WEIGHT BEARING   Weight bearing as tolerated with assist  device (walker, cane, etc) as directed, use it as long as suggested by your surgeon or therapist, typically at least 4-6 weeks.   CONSTIPATION  Constipation is defined medically as fewer than three stools per week and severe constipation as less than one stool per week.  Even if you have a regular bowel pattern at home, your normal regimen is likely to be disrupted due to multiple reasons following surgery.  Combination of anesthesia, postoperative narcotics, change in appetite and fluid intake all can affect your bowels.   YOU MUST use at least one of the following options; they are listed in order of increasing strength to get the job done.  They are all available over the counter, and you may need to use some, POSSIBLY even all of these options:    Drink plenty of fluids (prune juice may be helpful) and high fiber foods Colace 100 mg by mouth twice a day  Senokot for constipation as directed and as needed Dulcolax (bisacodyl), take with full glass of water  Miralax (polyethylene glycol) once or twice a day as needed.  If you have tried all these things and are unable to have a bowel movement in the first 3-4 days after surgery call either your surgeon or your primary doctor.    If you experience loose stools or diarrhea, hold the medications until you stool forms back up.  If your symptoms do not get  better within 1 week or if they get worse, check with your doctor.  If you experience "the worst abdominal pain ever" or develop nausea or vomiting, please contact the office immediately for further recommendations for treatment.   ITCHING:  If you experience itching with your medications, try taking only a single pain pill, or even half a pain pill at a time.  You can also use Benadryl over the counter for itching or also to help with sleep.   TED HOSE STOCKINGS:  Use stockings on both legs until for at least 2 weeks or as directed by physician office. They may be removed at night for  sleeping.  MEDICATIONS:  See your medication summary on the "After Visit Summary" that nursing will review with you.  You may have some home medications which will be placed on hold until you complete the course of blood thinner medication.  It is important for you to complete the blood thinner medication as prescribed.  PRECAUTIONS:  If you experience chest pain or shortness of breath - call 911 immediately for transfer to the hospital emergency department.   If you develop a fever greater that 101 F, purulent drainage from wound, increased redness or drainage from wound, foul odor from the wound/dressing, or calf pain - CONTACT YOUR SURGEON.                                                   FOLLOW-UP APPOINTMENTS:  If you do not already have a post-op appointment, please call the office for an appointment to be seen by your surgeon.  Guidelines for how soon to be seen are listed in your "After Visit Summary", but are typically between 1-4 weeks after surgery.  OTHER INSTRUCTIONS:   Knee Replacement:  Do not place pillow under knee, focus on keeping the knee straight while resting. CPM instructions: 0-90 degrees, 2 hours in the morning, 2 hours in the afternoon, and 2 hours in the evening. Place foam block, curve side up under heel at all times except when in CPM or when walking.  DO NOT modify, tear, cut, or change the foam block in any way.  MAKE SURE YOU:  . Understand these instructions.  . Get help right away if you are not doing well or get worse.    Thank you for letting us be a part of your medical care team.  It is a privilege we respect greatly.  We hope these instructions will help you stay on track for a fast and full recovery!

## 2019-04-03 NOTE — Op Note (Signed)
NAME: [name], [name] MEDICAL RECORD [id number] ACCOUNT [id number] DATE OF BIRTH:Jun 21, 1950 FACILITY: [facility] LOCATION: [facility] PHYSICIAN:Chamya Hunton Marian Sorrow, MD  OPERATIVE REPORT  DATE OF PROCEDURE:  04/03/2019  PREOPERATIVE DIAGNOSIS:  History of left total knee replacement with infection.  POSTOPERATIVE DIAGNOSES/FINDINGS:    1.  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 2.  Proximal tibial fractures.  PROCEDURE:  Revision left total knee replacement.  COMPONENTS USED:  A DePuy Sigma tibial revision tray with a size 4 tibial tray, a 61 mm press-fit sleeve with a 14 x 115 mm press-fit stem.  SURGEON:  Paralee Cancel, MD  ASSISTANT:  Danae Orleans, PA-C  Note that Mr. [name] was present for the entirety of the case from preoperative position, perioperative management of the operative extremity, general facilitation of the case and primary wound closure.  ANESTHESIA:  Failed spinal then general anesthetic.  TOURNIQUET:  Up for 84 minutes at 250 mmHg.  ESTIMATED BLOOD LOSS:  Less  than 200 mL.  DRAINS:  None.  SPECIMENS:  Fluid from the joint was taken and sent to pathology for evaluation.  FINDINGS:   As noted below.  INDICATIONS  FOR PROCEDURE:  The patient is a 69 year old gentleman with a BMI of 56.  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  Most recently, he has had reimplantation  of a left knee replacement with infection.  Based on his size, mobility and other issues, I have been trying to treat him conservatively.  He has had at this point 2 other prior open excisional and nonexcisional debridement with Irrisept.  In the office  over the past few weeks, he has noted increasing pain in his knee, feeling a pop in his knee.  Radiographically, I was unable to identify any pathology.  His lab work most recently had a C-reactive protein of 0.5 with an  elevated sedimentation rate of  90.  For the reasons presented and the concern for persistent infection, we elected to take him back to the operating room with the planned I and D.  I have at this point to date maintained his femoral component due to the fact that we had previously  revised it with a press-fit femoral sleeve.  I have worried and expressed this to him that the removal of the sleeve could potentially result in complete destruction of the distal femur, making further reimplantation extremely challenging, if not  impossible.  I reviewed with him the risks of persistent recurrent infection.  We discussed the postoperative course as presented it to ourselves in the clinic prior to today.  Consent was obtained for benefit of management of his knee.  PROCEDURE IN DETAIL:  The patient was brought to the operative theater.  Once adequate anesthesia was established, preoperative antibiotics administered, he was positioned supine with a left thigh tourniquet placed.  The left lower extremity was prepped  and draped in sterile fashion.  A timeout was performed, identifying the patient, the planned procedure and extremity.  Leg was exsanguinated, tourniquet elevated to 250 mmHg.  His old incision was excised.  Soft tissue planes created.  When I made an  arthrotomy into the knee, the fluid in his joint looked clear.  There was blood tingeing to it, but there were no signs of purulence.  I spent the initial portion of this case at exposure, removing a significant amount of scar within the suprapatellar  pouch and the medial  and lateral gutters.  As I worked on the proximal medial tibia for exposure, we identified that the proximal tibia indeed had longitudinal fractured lines within the proximal metaphyseal region.  Once I had the knee exposed, we  removed the tibial component, which came out fairly easily.  Then I removed the remaining cement within the metaphyseal segment.  Again, these fracture  segments seemed to be within the proximal metaphyseal region of his remaining bone stock, but did not  appear to progress distally.  Given all the findings that I was seeing here, which included this extreme challenges with his overall morbidity related to his size, his mobility limitations with a previously performed cemented articulating spacer, as well  as the clinical protection picture including the CRP of 0.5 despite an elevated sedimentation rate with relatively clear synovial fluid.  Given this, as well as the proximal tibial findings, I elected to proceed with reimplantation.  I felt this was the  only way that I could get him mobilized to any degree.  Any other attempt at cemented spacer was not going to be a viable option.  Given these findings, I went ahead and prepared the proximal tibia for press-fit stems by reaming to 14 mm.  I then  evaluated for broaching to see if there would be any way to get some proximal support for rotation.  We broached up to a 61.  Once I had done this, we did a trial reduction to at least see the range at which our insert would be and that there would be no  need to move higher up on the sleeves.  Given the findings recognized, we removed the trial components.  We irrigated the knee with 6 L of normal saline solution as further debridement was carried out.  Following this, we irrigated the knee with 800 mL  of Irrisept, chlorhexidine-based antimicrobial fluid and let it sit in the knee for greater than 5 minutes.  While this was in, we changed our gloves and opened up the final components.  The final components were configured on the back table under my  direct supervision.  Once this was done, then with the proximal tibia exposed,  I impacted the tibial component with a press-fit stem.  This press-fit sleeve on this Sigma knee revision system, the proximal portion of the sleeve is coated for porous  coating.  I was able to get some of this to bite into the lateral  proximal tibia.  I elected to mixed 3 batches of cement with 3 g of vancomycin and then packed the cement around the anteromedial and posterior aspect of the tibia proximally.  The hope  was to get some antibiotic into the cement packed around here.  I did mix 3 g of vancomycin with 3.6 of tobramycin in the cement.  We brought the knee out to extension with a 20 mm insert and waited for the cement to fully cure.  Once the cement was  cured, we selected a 20 mm TC3 insert to match the size 5 femur that was placed in the tray and the knee was reduced.  We reirrigated the knee again after letting the tourniquet down.  No significant hemostasis was required.  We then spent time closing  the extensor mechanism with multiple #1 interrupted Vicryl sutures, as well as #1 Stratafix suture for support.  We then closed the subcutaneous layer with 2-0 Vicryl and a running Monocryl in the skin.  The skin was then cleaned, dried  and dressed  sterilely using surgical glue and Aquacel dressing.  The knee was wrapped in a sterile bulky dressing and a knee immobilizer.  He was brought to the recovery room.  Postoperatively, I will get a PICC line placed.  We will utilize antibiotics as he had done before for at least 6 weeks and then be on suppressive oral antibiotics for the rest of his life with hopes that this in fact is cured.  He will be partial  weightbearing 25-50%, which for him may be extremely challenging, but we need to protect his knee as much as possible and this has been stressed to the patient.  VN/NUANCE  D:04/03/2019 T:04/03/2019 JOB:[id number]

## 2019-04-03 NOTE — Progress Notes (Signed)
Received order for PICC.  Please note that this patients PICC needs to be placed by Interventional Radiology due to IV team not being successful in placing PICCs in past.  Thanks,

## 2019-04-03 NOTE — Transfer of Care (Signed)
Immediate Anesthesia Transfer of Care Note  Patient: [name]  Procedure(s) Performed: REIMPLANTATION OF LEFT KNEE TIBIAL COMPONENT. (Left Knee)  Patient Location: PACU  Anesthesia Type:General  Level of Consciousness: awake, alert  and oriented  Airway & Oxygen Therapy: Patient Spontanous Breathing and Patient connected to face mask oxygen  Post-op Assessment: Report given to RN and Post -op Vital signs reviewed and stable  Post vital signs: Reviewed and stable  Last Vitals:  Vitals Value Taken Time  BP    Temp    Pulse    Resp    SpO2      Last Pain:  Vitals:   04/03/19 1354  TempSrc: Oral  PainSc: 0-No pain         Complications: No apparent anesthesia complications

## 2019-04-03 NOTE — Anesthesia Procedure Notes (Signed)
Procedure Name: Intubation Date/Time: 04/03/2019 2:31 PM Performed by: Sharlette Dense, CRNA Patient Re-evaluated:Patient Re-evaluated prior to induction Oxygen Delivery Method: Circle system utilized Preoxygenation: Pre-oxygenation with 100% oxygen Induction Type: IV induction and Rapid sequence Laryngoscope Size: Miller and 3 Grade View: Grade I Tube type: Oral Tube size: 8.0 mm Number of attempts: 1 Airway Equipment and Method: Stylet Placement Confirmation: ETT inserted through vocal cords under direct vision,  positive ETCO2 and breath sounds checked- equal and bilateral Secured at: 23 cm Tube secured with: Tape Dental Injury: Teeth and Oropharynx as per pre-operative assessment

## 2019-04-03 NOTE — Anesthesia Procedure Notes (Signed)
Anesthesia Regional Block: Adductor canal block   Pre-Anesthetic Checklist: ,, timeout performed, Correct Patient, Correct Site, Correct Laterality, Correct Procedure, Correct Position, site marked, Risks and benefits discussed,  Surgical consent,  Pre-op evaluation,  At surgeon's request and post-op pain management  Laterality: Left  Prep: chloraprep       Needles:  Injection technique: Single-shot  Needle Type: Echogenic Needle     Needle Length: 9cm  Needle Gauge: 21     Additional Needles:   Narrative:  Start time: 04/03/2019 1:45 PM End time: 04/03/2019 1:51 PM Injection made incrementally with aspirations every 5 mL.  Performed by: Personally  Anesthesiologist: Albertha Ghee, MD  Additional Notes: Pt tolerated the procedure well.

## 2019-04-04 ENCOUNTER — Observation Stay (HOSPITAL_COMMUNITY): Payer: Medicare Other

## 2019-04-04 LAB — CBC
HCT: 30.4 % — ABNORMAL LOW (ref 39.0–52.0)
Hemoglobin: 8.9 g/dL — ABNORMAL LOW (ref 13.0–17.0)
MCH: 28.7 pg (ref 26.0–34.0)
MCHC: 29.3 g/dL — ABNORMAL LOW (ref 30.0–36.0)
MCV: 98.1 fL (ref 80.0–100.0)
Platelets: 219 10*3/uL (ref 150–400)
RBC: 3.1 MIL/uL — ABNORMAL LOW (ref 4.22–5.81)
RDW: 16.2 % — ABNORMAL HIGH (ref 11.5–15.5)
WBC: 4.6 10*3/uL (ref 4.0–10.5)
nRBC: 0 % (ref 0.0–0.2)

## 2019-04-04 LAB — BASIC METABOLIC PANEL
Anion gap: 6 (ref 5–15)
BUN: 16 mg/dL (ref 8–23)
CO2: 25 mmol/L (ref 22–32)
Calcium: 9.6 mg/dL (ref 8.9–10.3)
Chloride: 106 mmol/L (ref 98–111)
Creatinine, Ser: 0.77 mg/dL (ref 0.61–1.24)
GFR calc Af Amer: >60 mL/min (ref >60)
GFR calc non Af Amer: >60 mL/min (ref >60)
Glucose, Bld: 158 mg/dL — ABNORMAL HIGH (ref 70–99)
Potassium: 4 mmol/L (ref 3.5–5.1)
Sodium: 137 mmol/L (ref 135–145)

## 2019-04-04 MED ORDER — VANCOMYCIN HCL 1500 MG/300ML IV SOLN: 1500.0000 mg | Freq: Two times a day (BID) | INTRAVENOUS | Status: DC

## 2019-04-04 MED ORDER — LIDOCAINE HCL 1 % IJ SOLN
INTRAMUSCULAR | Status: AC
  Filled 2019-04-04: qty 20

## 2019-04-04 MED ORDER — HEPARIN SOD (PORK) LOCK FLUSH 100 UNIT/ML IV SOLN
INTRAVENOUS | Status: AC
  Filled 2019-04-04: qty 5

## 2019-04-04 MED ORDER — SODIUM CHLORIDE 0.9 % IV SOLN
2.0000 g | INTRAVENOUS | Status: DC
  Administered 2019-04-04 – 2019-04-06 (×3): 2 g via INTRAVENOUS
  Filled 2019-04-04: qty 20
  Filled 2019-04-04 (×2): qty 2

## 2019-04-04 MED ORDER — CHLORHEXIDINE GLUCONATE CLOTH 2 % EX PADS
6.0000 | MEDICATED_PAD | Freq: Every day | CUTANEOUS | Status: DC
  Administered 2019-04-04 – 2019-04-05 (×2): 6 via TOPICAL

## 2019-04-04 MED ORDER — RIFAMPIN 300 MG PO CAPS
300.0000 mg | ORAL_CAPSULE | Freq: Two times a day (BID) | ORAL | Status: DC
  Filled 2019-04-04: qty 1

## 2019-04-04 MED ORDER — LIDOCAINE HCL 1 % IJ SOLN
INTRAMUSCULAR | Status: DC | PRN
  Administered 2019-04-04 (×2): 3 mL

## 2019-04-04 MED ORDER — VANCOMYCIN HCL 1250 MG/250ML IV SOLN
1250.0000 mg | Freq: Two times a day (BID) | INTRAVENOUS | Status: DC
  Administered 2019-04-04 – 2019-04-06 (×4): 1250 mg via INTRAVENOUS
  Filled 2019-04-04 (×5): qty 250

## 2019-04-04 NOTE — Progress Notes (Addendum)
PHARMACY CONSULT NOTE FOR:  OUTPATIENT  PARENTERAL ANTIBIOTIC THERAPY (OPAT)  Indication: PJI Regimen: Vancomycin 1250 mg IV q12; Ceftriaxone 2 gm IV q24,  No rifampin due to drug interaction with xarelto  End date: 05/16/2019  IV antibiotic discharge orders are pended. To discharging provider:  please sign these orders via discharge navigator,  Select New Orders & click on the button choice - Manage This Unsigned Work.    Thank you for allowing pharmacy to be a part of this patient's care.  Eudelia Bunch, Pharm.D [phone] 04/04/2019 9:42 AM

## 2019-04-04 NOTE — Progress Notes (Signed)
     Subjective: 1 Day Post-Op Procedure(s) (LRB): REIMPLANTATION OF LEFT KNEE TIBIAL COMPONENT. (Left)   Patient reports pain as mild, pain controlled.  No reported events overnight.  Dr. Alvan Dame discussed the procedure, findings and expectations moving forward including his partial weightbearing status.  Discussed having interventional radiology consult today to give him a PICC line.  We will see how he does today with possible discharge home tomorrow.   Objective:   VITALS:   Vitals:   04/04/19 0228 04/04/19 0603  BP: 125/87 112/68  Pulse: 99 87  Resp: 16 16  Temp: 98.3 F (36.8 C) 98.2 F (36.8 C)  SpO2: 98% 95%    Dorsiflexion/Plantar flexion intact Incision: dressing C/D/I Compartment soft  LABS Recent Labs    04/04/19 0503  HGB 8.9*  HCT 30.4*  WBC 4.6  PLT 219    Recent Labs    04/04/19 0503  NA 137  K 4.0  BUN 16  CREATININE 0.77  GLUCOSE 158*     Assessment/Plan: 1 Day Post-Op Procedure(s) (LRB): REIMPLANTATION OF LEFT KNEE TIBIAL COMPONENT. (Left) Foley cath DC'd Partial weightbearing 50% left lower extremity. Order placed for interventional radiology to place PICC line Advance diet Up with therapy D/C IV fluids Discharge home with home health eventually, when ready  Morbid Obesity (BMI >40)  Estimated body mass index is 56.71 kg/m as calculated from the following:   Height as of this encounter: 5\' 8"  (1.727 m).   Weight as of this encounter: 169.2 kg. Patient also counseled that weight may inhibit the healing process Patient counseled that losing weight will help with future health issues         Danae Orleans PA-C  [place] is now [facility]  [place] [address], [address], [place], [place] [address] Phone: [phone] [email or web address] Facebook  [employer]

## 2019-04-04 NOTE — Progress Notes (Signed)
Physical Therapy Treatment Patient Details Name: [name] MRN: [id number] DOB: [birth year] Today's Date: 04/04/2019    History of Present Illness Pt s/p L TKA reimplantation.  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    PT Comments    exercise focused session. Pt tolerated well, pain controlled this pm. PICC line placed this morning. Anticipate steady progress in acute setting.  Follow Up Recommendations  Follow surgeon's recommendation for DC plan and follow-up therapies(prefers OP)     Equipment Recommendations  None recommended by PT    Recommendations for Other Services       Precautions / Restrictions Precautions Precautions: Fall;Knee Required Braces or Orthoses: Knee Immobilizer - Left Restrictions LLE Weight Bearing: Partial weight bearing LLE Partial Weight Bearing Percentage or Pounds: 25-50%    Mobility  Bed Mobility               General bed mobility comments: pt in chair on arrival   Transfers                    Ambulation/Gait                 Stairs             Wheelchair Mobility    Modified Rankin (Stroke Patients Only)       Balance                                            Cognition Arousal/Alertness: Awake/alert Behavior During Therapy: WFL for tasks assessed/performed Overall Cognitive Status: Within Functional Limits for tasks assessed                                        Exercises Total Joint Exercises Ankle Circles/Pumps: AROM;15 reps;Both Quad Sets: AROM;Both;10 reps Short Arc QuadSinclair Ship;Left;10 reps Heel Slides: AAROM;AROM;Left;10 reps Hip ABduction/ADduction: AROM;Left;10 reps Straight Leg Raises: AAROM;Left;10 reps    General Comments        Pertinent Vitals/Pain Pain Assessment: 0-10 Pain Score: 3  Pain Location: left knee Pain Descriptors / Indicators: Grimacing;Sore Pain Intervention(s): Limited activity within  patient's tolerance;Monitored during session    Home Living                      Prior Function            PT Goals (current goals can now be found in the care plan section) Acute Rehab PT Goals PT Goal Formulation: With patient Time For Goal Achievement: 04/17/19 Potential to Achieve Goals: Good Progress towards PT goals: Progressing toward goals    Frequency    7X/week      PT Plan Current plan remains appropriate    Co-evaluation              AM-PAC PT "6 Clicks" Mobility   Outcome Measure  Help needed turning from your back to your side while in a flat bed without using bedrails?: A Little Help needed moving from lying on your back to sitting on the side of a flat bed without using bedrails?: A Little Help needed moving to and from a bed to a chair (including a wheelchair)?: A Little Help needed standing up from a chair using your  arms (e.g., wheelchair or bedside chair)?: A Little Help needed to walk in hospital room?: A Little Help needed climbing 3-5 steps with a railing? : A Little 6 Click Score: 18    End of Session   Activity Tolerance: Patient tolerated treatment well Patient left: in chair;with call bell/phone within reach   PT Visit Diagnosis: Difficulty in walking, not elsewhere classified (R26.2)     Time: [id number] PT Time Calculation (min) (ACUTE ONLY): 21 min  Charges:  $Therapeutic Exercise: 8-22 mins                     Baxter Flattery, PT   Acute Rehab Dept [facility]): [id number]   04/04/2019    [facility] 04/04/2019, 4:45 PM

## 2019-04-04 NOTE — Procedures (Signed)
Interventional Radiology Procedure Note  Procedure: RUE SL PICC  Complications: None  Estimated Blood Loss: MIN  Findings: TIP SVCRA

## 2019-04-04 NOTE — Progress Notes (Signed)
Pt declined use of cpap tonight.  Pt prefers to wear his nasal cannula instead, currently set at 2lpm.  Pt encouraged to call should he change his mind, RN aware.  Cpap remains in room on standby.

## 2019-04-04 NOTE — Evaluation (Signed)
Physical Therapy Evaluation Patient Details Name: [name] MRN: [id number] DOB: March 11, 1950 Today's Date: 04/04/2019   History of Present Illness  Pt s/p L TKA revision.  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  Clinical Impression  Pt will benefit from PT in acute setting. He was able to amb ~ 77' this am with min-guard assist. Will continue to follow. Pt prefers to go to OPPT post acute    Follow Up Recommendations Follow surgeon's recommendation for DC plan and follow-up therapies    Equipment Recommendations  None recommended by PT    Recommendations for Other Services       Precautions / Restrictions Precautions Precautions: Fall;Knee Required Braces or Orthoses: Knee Immobilizer - Left Restrictions Weight Bearing Restrictions: Yes LLE Weight Bearing: Partial weight bearing      Mobility  Bed Mobility Overal bed mobility: Needs Assistance Bed Mobility: Supine to Sit     Supine to sit: Min assist     General bed mobility comments: assist with LLE, incr time  Transfers Overall transfer level: Needs assistance Equipment used: Rolling walker (2 wheeled) Transfers: Sit to/from Stand Sit to Stand: Min guard;From elevated surface         General transfer comment: pt requests bed ht elevated, min/guard for safety  Ambulation/Gait Ambulation/Gait assistance: Min guard;Supervision Gait Distance (Feet): 60 Feet Assistive device: Rolling walker (2 wheeled) Gait Pattern/deviations: Step-to pattern     General Gait Details: cues for sequence and PWB  Stairs            Wheelchair Mobility    Modified Rankin (Stroke Patients Only)       Balance                                             Pertinent Vitals/Pain Pain Assessment: 0-10 Pain Score: 5  Pain Location: left knee Pain Descriptors / Indicators: Grimacing;Sore Pain Intervention(s): Premedicated before session;Monitored during  session;Limited activity within patient's tolerance;Repositioned    Home Living Family/patient expects to be discharged to:: Private residence Living Arrangements: Alone   Type of Home: House Home Access: Ramped entrance     Home Layout: One level Home Equipment: Environmental consultant - 4 wheels;Walker - 2 wheels;Cane - single point      Prior Function                 Hand Dominance        Extremity/Trunk Assessment   Upper Extremity Assessment Upper Extremity Assessment: Overall WFL for tasks assessed    Lower Extremity Assessment Lower Extremity Assessment: LLE deficits/detail LLE Deficits / Details: ankle WFL, knee extension and hip flexion  2+ to 3/5       Communication   Communication: No difficulties  Cognition Arousal/Alertness: Awake/alert Behavior During Therapy: WFL for tasks assessed/performed Overall Cognitive Status: Within Functional Limits for tasks assessed                                        General Comments      Exercises     Assessment/Plan    PT Assessment Patient needs continued PT services  PT Problem List Decreased strength;Decreased range of motion;Decreased activity tolerance;Decreased mobility;Pain       PT Treatment Interventions DME instruction;Gait training;Functional  mobility training;Therapeutic activities;Patient/family education;Therapeutic exercise    PT Goals (Current goals can be found in the Care Plan section)  Acute Rehab PT Goals PT Goal Formulation: With patient Time For Goal Achievement: 04/17/19 Potential to Achieve Goals: Good    Frequency 7X/week   Barriers to discharge        Co-evaluation               AM-PAC PT "6 Clicks" Mobility  Outcome Measure Help needed turning from your back to your side while in a flat bed without using bedrails?: A Little Help needed moving from lying on your back to sitting on the side of a flat bed without using bedrails?: A Little Help needed moving to  and from a bed to a chair (including a wheelchair)?: A Little Help needed standing up from a chair using your arms (e.g., wheelchair or bedside chair)?: A Little Help needed to walk in hospital room?: A Little Help needed climbing 3-5 steps with a railing? : A Little 6 Click Score: 18    End of Session Equipment Utilized During Treatment: Left knee immobilizer Activity Tolerance: Patient tolerated treatment well Patient left: Other (comment)(in w/c to go for procedure)   PT Visit Diagnosis: Difficulty in walking, not elsewhere classified (R26.2)    Time: 1100-1116 PT Time Calculation (min) (ACUTE ONLY): 16 min   Charges:   PT Evaluation $PT Eval Low Complexity: [place], PT   Acute Rehab Dept [facility]): [id number]   04/04/2019   [facility] 04/04/2019, 11:38 AM

## 2019-04-05 ENCOUNTER — Encounter (HOSPITAL_COMMUNITY): Payer: Self-pay

## 2019-04-05 LAB — BASIC METABOLIC PANEL
Anion gap: 7 (ref 5–15)
BUN: 13 mg/dL (ref 8–23)
CO2: 25 mmol/L (ref 22–32)
Calcium: 10 mg/dL (ref 8.9–10.3)
Chloride: 109 mmol/L (ref 98–111)
Creatinine, Ser: 0.83 mg/dL (ref 0.61–1.24)
GFR calc Af Amer: >60 mL/min (ref >60)
GFR calc non Af Amer: >60 mL/min (ref >60)
Glucose, Bld: 138 mg/dL — ABNORMAL HIGH (ref 70–99)
Potassium: 3.9 mmol/L (ref 3.5–5.1)
Sodium: 141 mmol/L (ref 135–145)

## 2019-04-05 LAB — CBC
HCT: 30.3 % — ABNORMAL LOW (ref 39.0–52.0)
Hemoglobin: 8.9 g/dL — ABNORMAL LOW (ref 13.0–17.0)
MCH: 28.8 pg (ref 26.0–34.0)
MCHC: 29.4 g/dL — ABNORMAL LOW (ref 30.0–36.0)
MCV: 98.1 fL (ref 80.0–100.0)
Platelets: 217 10*3/uL (ref 150–400)
RBC: 3.09 MIL/uL — ABNORMAL LOW (ref 4.22–5.81)
RDW: 16.3 % — ABNORMAL HIGH (ref 11.5–15.5)
WBC: 6 10*3/uL (ref 4.0–10.5)
nRBC: 0 % (ref 0.0–0.2)

## 2019-04-05 MED ORDER — DOCUSATE SODIUM 100 MG PO CAPS: 100.0000 mg | ORAL_CAPSULE | Freq: Two times a day (BID) | ORAL | 0 refills | Status: DC

## 2019-04-05 MED ORDER — SODIUM CHLORIDE 0.9% FLUSH
10.0000 mL | INTRAVENOUS | Status: DC | PRN
  Administered 2019-04-06: 10 mL

## 2019-04-05 MED ORDER — ACETAMINOPHEN 500 MG PO TABS: 1000.0000 mg | ORAL_TABLET | Freq: Three times a day (TID) | ORAL | 0 refills | Status: DC

## 2019-04-05 MED ORDER — VANCOMYCIN IV (FOR PTA / DISCHARGE USE ONLY): 1250.0000 mg | Freq: Two times a day (BID) | INTRAVENOUS | 0 refills | Status: DC

## 2019-04-05 MED ORDER — LOPERAMIDE HCL 2 MG PO CAPS
4.0000 mg | ORAL_CAPSULE | Freq: Once | ORAL | Status: AC
  Administered 2019-04-05: 4 mg via ORAL
  Filled 2019-04-05: qty 2

## 2019-04-05 MED ORDER — OXYCODONE HCL 5 MG PO TABS: 5.0000 mg | ORAL_TABLET | ORAL | 0 refills | Status: DC | PRN

## 2019-04-05 MED ORDER — METHOCARBAMOL 500 MG PO TABS: 500.0000 mg | ORAL_TABLET | Freq: Four times a day (QID) | ORAL | 0 refills | Status: DC | PRN

## 2019-04-05 MED ORDER — CEFTRIAXONE IV (FOR PTA / DISCHARGE USE ONLY): 2.0000 g | INTRAVENOUS | 0 refills | Status: DC

## 2019-04-05 MED ORDER — POLYETHYLENE GLYCOL 3350 17 G PO PACK: 17.0000 g | PACK | Freq: Two times a day (BID) | ORAL | 0 refills | Status: DC

## 2019-04-05 MED ORDER — FERROUS SULFATE 325 (65 FE) MG PO TABS: 325.0000 mg | ORAL_TABLET | Freq: Three times a day (TID) | ORAL | 0 refills | Status: DC

## 2019-04-05 NOTE — TOC Progression Note (Signed)
Transition of Care [facility]) - Progression Note    Patient Details  Name: [name] MRN: [id number] Date of Birth: Oct 08, 1950  [facility]) CM/SW [place], LCSW Phone Number: 04/05/2019, 10:14 AM  Clinical Narrative:    [facility] [place] liaison Carolynn Sayers notified patient will need home infusion.  Home PT/RN will be provided by [facility]  Patient reports understanding the plan.    Expected Discharge Plan: [place] Barriers to Discharge: Continued Medical Work up  Expected Discharge Plan and Services Expected Discharge Plan: [place]   Discharge Planning Services: CM Consult Post Acute Care Choice: [place] arrangements for the past 2 months: Single Family Home Expected Discharge Date: 04/05/19               DME Arranged: (Has DME)         HH Arranged: PT, IV Antibiotics, RN HH Agency: [facility] [place], [facility] Date [place]: 04/05/19 Time [place]: [phone] Representative spoke with at [place] Determinants of Health ([place]) Interventions    Readmission Risk Interventions No flowsheet data found.

## 2019-04-05 NOTE — Progress Notes (Signed)
Patient ambulating with walker to bathroom. No complaint of pain.

## 2019-04-05 NOTE — Progress Notes (Signed)
Physical Therapy Treatment Patient Details Name: [name] MRN: [id number] DOB: 1950/02/11 Today's Date: 04/05/2019    History of Present Illness Pt s/p L TKA reimplantation.  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  Noted improvement in stability and follow through with  PWB.  Follow Up Recommendations  Follow surgeon's recommendation for DC plan and follow-up therapies     Equipment Recommendations  None recommended by PT    Recommendations for Other Services       Precautions / Restrictions Precautions Precautions: Fall;Knee Required Braces or Orthoses: Knee Immobilizer - Left Knee Immobilizer - Left: Discontinue once straight leg raise with < 10 degree lag Restrictions Weight Bearing Restrictions: Yes LLE Weight Bearing: Partial weight bearing LLE Partial Weight Bearing Percentage or Pounds: 25-50%    Mobility  Bed Mobility Overal bed mobility: Needs Assistance Bed Mobility: Sit to Supine       Sit to supine: Supervision   General bed mobility comments: use of bedrail but no physical assist  Transfers Overall transfer level: Needs assistance Equipment used: Rolling walker (2 wheeled) Transfers: Sit to/from Stand Sit to Stand: Min guard;From elevated surface         General transfer comment: steady assist only  Ambulation/Gait Ambulation/Gait assistance: Min Gaffer (Feet): 15 Feet Assistive device: Rolling walker (2 wheeled) Gait Pattern/deviations: Step-to pattern Gait velocity: decreased   General Gait Details: cues for sequence, pace and PWB   Stairs             Wheelchair Mobility    Modified Rankin (Stroke Patients Only)       Balance Overall balance assessment: Mild deficits observed, not formally tested                                          Cognition Arousal/Alertness:  Awake/alert Behavior During Therapy: WFL for tasks assessed/performed Overall Cognitive Status: Within Functional Limits for tasks assessed                                        Exercises Total Joint Exercises Ankle Circles/Pumps: AROM;15 reps;Both Quad Sets: AROM;Both;10 reps Short Arc QuadSinclair [name];Left;10 reps Heel Slides: AAROM;AROM;Left;10 reps Hip ABduction/ADduction: AROM;Left;10 reps Straight Leg Raises: Left;10 reps;AAROM;AROM    General Comments        Pertinent Vitals/Pain Pain Assessment: 0-10 Pain Score: 5  Pain Location: left knee Pain Descriptors / Indicators: Grimacing;Sore Pain Intervention(s): Monitored during session;Limited activity within patient's tolerance;Ice applied;Premedicated before session    Home Living                      Prior Function            PT Goals (current goals can now be found in the care plan section) Acute Rehab PT Goals PT Goal Formulation: With patient Time For Goal Achievement: 04/17/19 Potential to Achieve Goals: Good Progress towards PT goals: Progressing toward goals    Frequency    7X/week      PT Plan Current plan remains appropriate    Co-evaluation              AM-PAC PT "6  Clicks" Mobility   Outcome Measure  Help needed turning from your back to your side while in a flat bed without using bedrails?: A Little Help needed moving from lying on your back to sitting on the side of a flat bed without using bedrails?: A Little Help needed moving to and from a bed to a chair (including a wheelchair)?: A Little Help needed standing up from a chair using your arms (e.g., wheelchair or bedside chair)?: A Little Help needed to walk in hospital room?: A Little Help needed climbing 3-5 steps with a railing? : A Little 6 Click Score: 18    End of Session   Activity Tolerance: Patient tolerated treatment well Patient left: in bed;with call bell/phone within reach;with bed alarm  set Nurse Communication: Mobility status PT Visit Diagnosis: Difficulty in walking, not elsewhere classified (R26.2)     Time: [id number] PT Time Calculation (min) (ACUTE ONLY): 20 min  Charges:  $Gait Training: 8-22 mins $Therapeutic Exercise: 8-22 mins                     [place] Pager [phone] Office [phone]    [name] 04/05/2019, 4:09 PM

## 2019-04-05 NOTE — Progress Notes (Signed)
Pt declined use of CPAP while sleeping and instead wishes to wear nasal cannula as he has been the previous night.  CPAP remains in the room incase the Pt changes his mind.  Pt placed on 2Lpm nasal cannula with vital signs noted.

## 2019-04-05 NOTE — Progress Notes (Signed)
     Subjective: 2 Days Post-Op Procedure(s) (LRB): REIMPLANTATION OF LEFT KNEE TIBIAL COMPONENT. (Left)   Patient reports pain as mild, pain controlled medication.  No reported events throughout the night.  Bulky portion of the dressing was removed down and the Aquacel was left in place.  Dressing was clear of any drainage.  I stressed with the patient's the importance of partial weightbearing, he states he understands.  Patient states he would like to get a the hospital today and get home, he did receive his PICC line yesterday.  Patient be ready to discharge home, if he does well therapy.    Objective:   VITALS:   Vitals:   04/04/19 2320 04/05/19 0538  BP:  123/88  Pulse:  69  Resp:  19  Temp:  98 F (36.7 C)  SpO2: 96% 99%    Dorsiflexion/Plantar flexion intact Incision: dressing C/D/I No cellulitis present Compartment soft  LABS Recent Labs    04/04/19 0503 04/05/19 0340  HGB 8.9* 8.9*  HCT 30.4* 30.3*  WBC 4.6 6.0  PLT 219 217    Recent Labs    04/04/19 0503 04/05/19 0340  NA 137 141  K 4.0 3.9  BUN 16 13  CREATININE 0.77 0.83  GLUCOSE 158* 138*     Assessment/Plan: 2 Days Post-Op Procedure(s) (LRB): REIMPLANTATION OF LEFT KNEE TIBIAL COMPONENT. (Left)  Ace bandage removed Aquacel dressing remains in place, clean dry and intact Up with therapy Discharge home with home health         Danae Orleans PA-C  [place] is now [facility]  [place] [address] [place], [place], [place] [address] Phone: [phone] [email or web address] Facebook  [employer]

## 2019-04-05 NOTE — Progress Notes (Signed)
Physical Therapy Treatment Patient Details Name: [name] MRN: [id number] DOB: [birth year] Today's Date: 04/05/2019    History of Present Illness Pt s/p L TKA reimplantation.  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     Follow Up Recommendations  Follow surgeon's recommendation for DC plan and follow-up therapies     Equipment Recommendations  None recommended by PT    Recommendations for Other Services       Precautions / Restrictions Precautions Precautions: Fall;Knee Required Braces or Orthoses: Knee Immobilizer - Left Knee Immobilizer - Left: Discontinue once straight leg raise with < 10 degree lag Restrictions Weight Bearing Restrictions: Yes LLE Weight Bearing: Partial weight bearing LLE Partial Weight Bearing Percentage or Pounds: 25-50%    Mobility  Bed Mobility               General bed mobility comments: pt in bathroom on arrival  Transfers Overall transfer level: Needs assistance Equipment used: Rolling walker (2 wheeled) Transfers: Sit to/from Stand Sit to Stand: Min guard;From elevated surface         General transfer comment: steady assist only  Ambulation/Gait Ambulation/Gait assistance: Min Gaffer (Feet): 60 Feet Assistive device: Rolling walker (2 wheeled) Gait Pattern/deviations: Step-to pattern Gait velocity: decreased   General Gait Details: cues for sequence and PWB   Stairs             Wheelchair Mobility    Modified Rankin (Stroke Patients Only)       Balance Overall balance assessment: Mild deficits observed, not formally tested                                          Cognition Arousal/Alertness: Awake/alert Behavior During Therapy: WFL for tasks assessed/performed Overall Cognitive Status: Within Functional Limits for tasks  assessed                                        Exercises      General Comments        Pertinent Vitals/Pain Pain Assessment: 0-10 Pain Score: 6  Pain Location: left knee Pain Descriptors / Indicators: Grimacing;Sore Pain Intervention(s): Limited activity within patient's tolerance;Monitored during session;Premedicated before session;Ice applied    Home Living                      Prior Function            PT Goals (current goals can now be found in the care plan section) Acute Rehab PT Goals PT Goal Formulation: With patient Time For Goal Achievement: 04/17/19 Potential to Achieve Goals: Good Progress towards PT goals: Progressing toward goals    Frequency    7X/week      PT Plan Current plan remains appropriate    Co-evaluation              AM-PAC PT "6 Clicks" Mobility   Outcome Measure  Help needed turning from your back to your side while in a flat bed without using bedrails?: A Little Help needed moving from lying on your back to sitting on the side of a  flat bed without using bedrails?: A Little Help needed moving to and from a bed to a chair (including a wheelchair)?: A Little Help needed standing up from a chair using your arms (e.g., wheelchair or bedside chair)?: A Little Help needed to walk in hospital room?: A Little Help needed climbing 3-5 steps with a railing? : A Little 6 Click Score: 18    End of Session   Activity Tolerance: Patient tolerated treatment well Patient left: in chair;with call bell/phone within reach Nurse Communication: Mobility status PT Visit Diagnosis: Difficulty in walking, not elsewhere classified (R26.2)     Time: [id number] PT Time Calculation (min) (ACUTE ONLY): 19 min  Charges:  $Gait Training: 8-22 mins                     [place] Pager [phone] Office [phone]    Ioane Bhola 04/05/2019, 12:16 PM

## 2019-04-05 NOTE — Progress Notes (Signed)
Physical Therapy Treatment Patient Details Name: [name] MRN: [id number] DOB: [birth year] Today's Date: 04/05/2019    History of Present Illness Pt s/p L TKA reimplantation.  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    PT Comments    Pt with marked improvement in general stability and follow through with PWB status.   Follow Up Recommendations  Follow surgeon's recommendation for DC plan and follow-up therapies     Equipment Recommendations  None recommended by PT    Recommendations for Other Services       Precautions / Restrictions Precautions Precautions: Fall;Knee Required Braces or Orthoses: Knee Immobilizer - Left Knee Immobilizer - Left: Discontinue once straight leg raise with < 10 degree lag Restrictions Weight Bearing Restrictions: Yes LLE Weight Bearing: Partial weight bearing LLE Partial Weight Bearing Percentage or Pounds: 25-50%    Mobility  Bed Mobility Overal bed mobility: Needs Assistance Bed Mobility: Sit to Supine       Sit to supine: Supervision   General bed mobility comments: use of bedrail but no physical assist  Transfers Overall transfer level: Needs assistance Equipment used: Rolling walker (2 wheeled) Transfers: Sit to/from Stand Sit to Stand: Min guard;From elevated surface         General transfer comment: steady assist only  Ambulation/Gait Ambulation/Gait assistance: Min Gaffer (Feet): 75 Feet Assistive device: Rolling walker (2 wheeled) Gait Pattern/deviations: Step-to pattern Gait velocity: decreased   General Gait Details: cues for sequence, pace and PWB   Stairs             Wheelchair Mobility    Modified Rankin (Stroke Patients Only)       Balance Overall balance assessment: Mild deficits observed, not formally tested                                          Cognition Arousal/Alertness: Awake/alert Behavior During Therapy:  WFL for tasks assessed/performed Overall Cognitive Status: Within Functional Limits for tasks assessed                                        Exercises      General Comments        Pertinent Vitals/Pain Pain Assessment: 0-10 Pain Score: 5  Pain Location: left knee Pain Descriptors / Indicators: Grimacing;Sore Pain Intervention(s): Limited activity within patient's tolerance;Monitored during session;Premedicated before session    Home Living                      Prior Function            PT Goals (current goals can now be found in the care plan section) Acute Rehab PT Goals PT Goal Formulation: With patient Time For Goal Achievement: 04/17/19 Potential to Achieve Goals: Good Progress towards PT goals: Progressing toward goals    Frequency    7X/week      PT Plan Current plan remains appropriate    Co-evaluation              AM-PAC PT "6 Clicks" Mobility   Outcome Measure  Help needed turning from your back to your side while in a flat bed without using bedrails?: A Little Help needed moving from lying on your back to  sitting on the side of a flat bed without using bedrails?: A Little Help needed moving to and from a bed to a chair (including a wheelchair)?: A Little Help needed standing up from a chair using your arms (e.g., wheelchair or bedside chair)?: A Little Help needed to walk in hospital room?: A Little Help needed climbing 3-5 steps with a railing? : A Little 6 Click Score: 18    End of Session   Activity Tolerance: Patient tolerated treatment well Patient left: in bed;with call bell/phone within reach;with bed alarm set Nurse Communication: Mobility status PT Visit Diagnosis: Difficulty in walking, not elsewhere classified (R26.2)     Time: 1310-1330 PT Time Calculation (min) (ACUTE ONLY): 20 min  Charges:  $Gait Training: 8-22 mins                     [place] Pager  [phone] Office [phone]    Shakhia Gramajo 04/05/2019, 4:05 PM

## 2019-04-06 DIAGNOSIS — Z79899 Other long term (current) drug therapy: Secondary | ICD-10-CM | POA: Diagnosis not present

## 2019-04-06 DIAGNOSIS — Z79891 Long term (current) use of opiate analgesic: Secondary | ICD-10-CM | POA: Diagnosis not present

## 2019-04-06 DIAGNOSIS — Y831 Surgical operation with implant of artificial internal device as the cause of abnormal reaction of the patient, or of later complication, without mention of misadventure at the time of the procedure: Secondary | ICD-10-CM | POA: Diagnosis present

## 2019-04-06 DIAGNOSIS — S82109A Unspecified fracture of upper end of unspecified tibia, initial encounter for closed fracture: Secondary | ICD-10-CM | POA: Diagnosis present

## 2019-04-06 DIAGNOSIS — M549 Dorsalgia, unspecified: Secondary | ICD-10-CM | POA: Diagnosis present

## 2019-04-06 DIAGNOSIS — Z79890 Hormone replacement therapy (postmenopausal): Secondary | ICD-10-CM | POA: Diagnosis not present

## 2019-04-06 DIAGNOSIS — E039 Hypothyroidism, unspecified: Secondary | ICD-10-CM | POA: Diagnosis present

## 2019-04-06 DIAGNOSIS — Z6841 Body Mass Index (BMI) 40.0 and over, adult: Secondary | ICD-10-CM | POA: Diagnosis not present

## 2019-04-06 DIAGNOSIS — I4819 Other persistent atrial fibrillation: Secondary | ICD-10-CM | POA: Diagnosis present

## 2019-04-06 DIAGNOSIS — K219 Gastro-esophageal reflux disease without esophagitis: Secondary | ICD-10-CM | POA: Diagnosis present

## 2019-04-06 DIAGNOSIS — I509 Heart failure, unspecified: Secondary | ICD-10-CM | POA: Diagnosis present

## 2019-04-06 DIAGNOSIS — Z86718 Personal history of other venous thrombosis and embolism: Secondary | ICD-10-CM | POA: Diagnosis not present

## 2019-04-06 DIAGNOSIS — Z7901 Long term (current) use of anticoagulants: Secondary | ICD-10-CM | POA: Diagnosis not present

## 2019-04-06 DIAGNOSIS — T8454XA Infection and inflammatory reaction due to internal left knee prosthesis, initial encounter: Secondary | ICD-10-CM | POA: Diagnosis present

## 2019-04-06 DIAGNOSIS — Z20822 Contact with and (suspected) exposure to covid-19: Secondary | ICD-10-CM | POA: Diagnosis present

## 2019-04-06 LAB — CREATININE, SERUM
Creatinine, Ser: 0.89 mg/dL (ref 0.61–1.24)
GFR calc Af Amer: >60 mL/min (ref >60)
GFR calc non Af Amer: >60 mL/min (ref >60)

## 2019-04-06 NOTE — Progress Notes (Signed)
Physical Therapy Treatment Patient Details Name: [name] MRN: [id number] DOB: 05/26/1950 Today's Date: 04/06/2019    History of Present Illness Pt s/p L TKA reimplantation.  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    PT Comments    Pt progressing well with mobility and eager for dc home.  Pt performed HEP with assist - written instruction with progression provided and reviewed.     Follow Up Recommendations  Follow surgeon's recommendation for DC plan and follow-up therapies     Equipment Recommendations  None recommended by PT    Recommendations for Other Services       Precautions / Restrictions Precautions Precautions: Fall;Knee Required Braces or Orthoses: Knee Immobilizer - Left Knee Immobilizer - Left: Discontinue once straight leg raise with < 10 degree lag Restrictions Weight Bearing Restrictions: Yes LLE Weight Bearing: Partial weight bearing LLE Partial Weight Bearing Percentage or Pounds: 25-50%    Mobility  Bed Mobility               General bed mobility comments: Pt up in chair and requests back to same.  Pt reports no difficulty OOB this am  Transfers Overall transfer level: Needs assistance Equipment used: Rolling walker (2 wheeled) Transfers: Sit to/from Stand Sit to Stand: Supervision            Ambulation/Gait Ambulation/Gait assistance: Supervision Gait Distance (Feet): 75 Feet Assistive device: Rolling walker (2 wheeled) Gait Pattern/deviations: Step-to pattern Gait velocity: decreased   General Gait Details: cues for sequence, pace, position from RW and PWB   Stairs Stairs: Yes Stairs assistance: Min guard Stair Management: No rails;Step to pattern;Backwards;With walker Number of Stairs: 1 General stair comments: Pt self cues for sequence   Wheelchair Mobility    Modified Rankin (Stroke Patients Only)       Balance Overall balance assessment: Mild deficits observed, not formally  tested                                          Cognition Arousal/Alertness: Awake/alert Behavior During Therapy: WFL for tasks assessed/performed Overall Cognitive Status: Within Functional Limits for tasks assessed                                        Exercises Total Joint Exercises Ankle Circles/Pumps: AROM;15 reps;Both Quad Sets: AROM;Both;15 reps;Supine Heel Slides: AAROM;AROM;Left;20 reps;Supine Hip ABduction/ADduction: AROM;Left;10 reps Straight Leg Raises: Left;AAROM;15 reps;Supine Long Arc Quad: AAROM;Left;15 reps;Seated Knee Flexion: AAROM;Left;10 reps;Seated Goniometric ROM: AAROM L knee -4 - 60    General Comments        Pertinent Vitals/Pain Pain Assessment: 0-10 Pain Score: 4  Pain Location: left knee Pain Descriptors / Indicators: Sore;Aching Pain Intervention(s): Limited activity within patient's tolerance;Monitored during session;Premedicated before session;Ice applied    Home Living                      Prior Function            PT Goals (current goals can now be found in the care plan section) Acute Rehab PT Goals PT Goal Formulation: With patient Time For Goal Achievement: 04/17/19 Potential to Achieve Goals: Good Progress towards PT goals: Progressing toward goals    Frequency    7X/week  PT Plan Current plan remains appropriate    Co-evaluation              AM-PAC PT "6 Clicks" Mobility   Outcome Measure  Help needed turning from your back to your side while in a flat bed without using bedrails?: A Little Help needed moving from lying on your back to sitting on the side of a flat bed without using bedrails?: A Little Help needed moving to and from a bed to a chair (including a wheelchair)?: None Help needed standing up from a chair using your arms (e.g., wheelchair or bedside chair)?: None Help needed to walk in hospital room?: None Help needed climbing 3-5 steps with a  railing? : A Little 6 Click Score: 21    End of Session   Activity Tolerance: Patient tolerated treatment well Patient left: in chair;with call bell/phone within reach Nurse Communication: Mobility status PT Visit Diagnosis: Difficulty in walking, not elsewhere classified (R26.2)     Time: [id number] PT Time Calculation (min) (ACUTE ONLY): 45 min  Charges:  $Gait Training: 8-22 mins $Therapeutic Exercise: 23-37 mins                     [place] Pager [phone] Office [phone]    Leydy Worthey 04/06/2019, 11:26 AM

## 2019-04-06 NOTE — Progress Notes (Signed)
     Subjective: 3 Days Post-Op Procedure(s) (LRB): REIMPLANTATION OF LEFT KNEE TIBIAL COMPONENT. (Left)   Patient reports pain as mild, pain controlled. No events reported throughout the night.  Already has his PICC line.  Ready to be discharged home.     Objective:   VITALS:   Vitals:   04/05/19 2311 04/06/19 0556  BP:  117/83  Pulse: 81 75  Resp: 18 18  Temp:  98.3 F (36.8 C)  SpO2: 99% 99%    Dorsiflexion/Plantar flexion intact Incision: dressing C/D/I No cellulitis present Compartment soft  LABS Recent Labs    04/04/19 0503 04/05/19 0340  HGB 8.9* 8.9*  HCT 30.4* 30.3*  WBC 4.6 6.0  PLT 219 217    Recent Labs    04/04/19 0503 04/05/19 0340 04/06/19 0408  NA 137 141  --   K 4.0 3.9  --   BUN 16 13  --   CREATININE 0.77 0.83 0.89  GLUCOSE 158* 138*  --      Assessment/Plan: 3 Days Post-Op Procedure(s) (LRB): REIMPLANTATION OF LEFT KNEE TIBIAL COMPONENT. (Left)  Up with therapy Discharge home with home health Follow up in 2 weeks at [facility] Follow up with OLIN,Zeynep Fantroy D in 2 weeks.  Contact information:  [facility] [address], [address] [place] [id number]          Danae Orleans PA-C  [place] [facility] now [facility]  [place] [address], [place], [place], [place] [address] Phone: [phone] [email or web address] Facebook  [employer]

## 2019-04-06 NOTE — Anesthesia Postprocedure Evaluation (Signed)
Anesthesia Post Note  Patient: [name]  Procedure(s) Performed: REIMPLANTATION OF LEFT KNEE TIBIAL COMPONENT. (Left Knee)     Patient location during evaluation: PACU Anesthesia Type: Regional and General Level of consciousness: awake and alert Pain management: pain level controlled Vital Signs Assessment: post-procedure vital signs reviewed and stable Respiratory status: spontaneous breathing, nonlabored ventilation, respiratory function stable and patient connected to nasal cannula oxygen Cardiovascular status: blood pressure returned to baseline and stable Postop Assessment: no apparent nausea or vomiting Anesthetic complications: no    Last Vitals:  Vitals:   04/05/19 2311 04/06/19 0556  BP:  117/83  Pulse: 81 75  Resp: 18 18  Temp:  36.8 C  SpO2: 99% 99%    Last Pain:  Vitals:   04/06/19 0556  TempSrc: Oral  PainSc:    Pain Goal: Patients Stated Pain Goal: 2 (04/05/19 2121)                 [place]

## 2019-04-06 NOTE — Progress Notes (Signed)
Patient remained free of falls this shift. A&O x4. Vital signs stable. Patient cleared for discharge. Provided and went over paperwork with patient. No questions or concerns. PICC remains in place for at home antibiotic therapy. Infusing teaching was done 2/25. Tech accompanied patient downstairs to meet ride.

## 2019-04-08 LAB — AEROBIC/ANAEROBIC CULTURE W GRAM STAIN (SURGICAL/DEEP WOUND): Culture: NO GROWTH

## 2019-04-10 NOTE — Discharge Summary (Signed)
Physician Discharge Summary  Patient ID: [name] MRN: [id number] DOB/AGE: March 02, 1950 69 y.o.  Admit date: 04/03/2019 Discharge date: 04/06/2019   Procedures:  Procedure(s) (LRB): REIMPLANTATION OF LEFT KNEE TIBIAL COMPONENT. (Left)  Attending Physician:  Dr. Paralee Cancel   Admission Diagnoses:   Persistent infection left knee status post I&D with retention of his femoral component  Discharge Diagnoses:  Principal Problem:   S/P left TK revision Active Problems:   S/P rev left TK  Past Medical History:  Diagnosis Date  . CHF (congestive heart failure) ([place])   . Chronic anticoagulation    with Xarelto  . Complication of anesthesia    PT STATES HARD TO WAKE UP AFTER ONE SUGERY -STATES THE SURGERY TOOK LONGER THAN EXPECTED.  NO PROBLEMS WITH ANY OTHER SURGERY  . Complication of anesthesia    in 12/2018-states started convulsing after surgery, could not get warm   . Dysrhythmia    A-fib  . GERD (gastroesophageal reflux disease)   . Headache   . History of blood clots   . History of blood transfusion   . Hypothyroidism   . Pain    BACK PAIN - PT ATTRIBUTES TO THE WAY HE WALKS DUE TO LEFT KNEE PROBLEM  . Persistent atrial fibrillation ([place])   . Septic arthritis of knee ([facility])    LEFT KNEE  . Shortness of breath    WITH EXERTION AND PAIN  . Sleep apnea    uses CPAP,    HPI:    Pt is a 69 y.o. male complaining of left knee pain. He is 13 weeks out from repeat I&D w/ placement of antibiotic spacer in left knee. He was last seen on 1/26 due to severity of lower leg pain. States he is not doing any better. Pain is very severe. He would like something stronger for pain. At his last office visit a repeat sedimentation rate and C-reactive proteins were ordered.  Labs were reviewed from 1 February indicating a C-reactive protein that has normalized to 0.5 to but a persistently elevated sedimentation rate of 95.  Due to his pain Dr. Alvan Dame recommends that he repeat again the  irrigation and debridement of his left knee with replacement of the cemented polyethylene insert. Dr. Alvan Dame expressed concerns regarding an attempt at resection of his femoral component and the morbidity associated with this. Socially he lives alone and has to bear weight. During irrigation Irricept will be used and perhaps even a Betadine wash. PICC line will be placed and IV antibiotics like to begin based on cultures taken in the operating room.   Risks, benefits and expectations were discussed with the patient. Patient understand the risks, benefits and expectations and wishes to proceed with surgery.    PCP: Bernerd Limbo, MD   Discharged Condition: good  Hospital Course:  Patient underwent the above stated procedure on 04/03/2019. Patient tolerated the procedure well and brought to the recovery room in good condition and subsequently to the floor.  POD #1 BP: 112/68 ; Pulse: 87 ; Temp: 98.2 F (36.8 C) ; Resp: 16 Patient reports pain as mild, pain controlled.  No reported events overnight.  Dr. Alvan Dame discussed the procedure, findings and expectations moving forward including his partial weightbearing status.  Discussed having interventional radiology consult today to give him a PICC line. Dorsiflexion/plantar flexion intact, incision: dressing C/D/I and compartment soft.   LABS  Basename    HGB     8.9  HCT     30.4  POD #2  BP: 123/88 ; Pulse: 69 ; Temp: 98 F (36.7 C) ; Resp: 19 Patient reports pain as mild, pain controlled medication.  No reported events throughout the night.  Bulky portion of the dressing was removed down and the Aquacel was left in place.  Dressing was clear of any drainage.  I stressed with the patient's the importance of partial weightbearing, he states he understands.  Patient states he would like to get a the hospital today and get home, he did receive his PICC line yesterday. Dorsiflexion/plantar flexion intact, incision: dressing C/D/I, no cellulitis present  and compartment soft.   LABS  Basename    HGB     8.9  HCT     30.3   POD #3  BP: 117/83 ; Pulse: 75 ; Temp: 98.3 F (36.8 C) ; Resp: 18 Patient reports pain as mild, pain controlled. No events reported throughout the night.  Already has his PICC line.  Ready to be discharged home. Dorsiflexion/plantar flexion intact, incision: dressing C/D/I, no cellulitis present and compartment soft.   LABS   No new labs  Discharge Exam: General appearance: alert, cooperative and no distress Extremities: Homans sign is negative, no sign of DVT, no edema, redness or tenderness in the calves or thighs and no ulcers, gangrene or trophic changes  Disposition: Home with follow up in 2 weeks   Follow-up Information    Paralee Cancel, MD. Schedule an appointment as soon as possible for a visit in 2 weeks.   Specialty: Orthopedic Surgery Contact information: [address] [place] [address] [phone]           Discharge Instructions    Call MD / Call 911   Complete by: As directed    If you experience chest pain or shortness of breath, CALL 911 and be transported to the hospital emergency room.  If you develope a fever above 101 F, pus (white drainage) or increased drainage or redness at the wound, or calf pain, call your surgeon's office.   Change dressing   Complete by: As directed    Maintain surgical dressing until follow up in the clinic. If the edges start to pull up, may reinforce with tape. If the dressing is no longer working, may remove and cover with gauze and tape, but must keep the area dry and clean.  Call with any questions or concerns.   Constipation Prevention   Complete by: As directed    Drink plenty of fluids.  Prune juice may be helpful.  You may use a stool softener, such as Colace (over the counter) 100 mg twice a day.  Use MiraLax (over the counter) for constipation as needed.   Diet - low sodium heart healthy   Complete by: As directed     Discharge instructions   Complete by: As directed    Maintain surgical dressing until follow up in the clinic. If the edges start to pull up, may reinforce with tape. If the dressing is no longer working, may remove and cover with gauze and tape, but must keep the area dry and clean.  Follow up in 2 weeks at [facility] Call with any questions or concerns.   Home infusion instructions   Complete by: As directed    Instructions: Flushing of vascular access device: 0.9% NaCl pre/post medication administration and prn patency; Heparin 100 u/ml, 55m for implanted ports and Heparin 10u/ml, 525mfor all other central venous catheters.   Partial weight bearing  Complete by: As directed    % Body Weight: 50   Laterality: left   Extremity: Lower   TED hose   Complete by: As directed    Use stockings (TED hose) for 2 weeks on both leg(s).  You may remove them at night for sleeping.      Allergies as of 04/06/2019      Reactions   Morphine Hives      Medication List    TAKE these medications   acetaminophen 500 MG tablet Commonly known as: TYLENOL Take 2 tablets (1,000 mg total) by mouth every 8 (eight) hours.   cefTRIAXone  IVPB Commonly known as: ROCEPHIN Inject 2 g into the vein daily. Indication:  PJI Last Day of Therapy:  05/16/2019 Labs - Once weekly:  CBC/D and BMP, Labs - Every other week:  ESR and CRP   diltiazem 240 MG 24 hr capsule Commonly known as: CARDIZEM CD TAKE 1 CAPSULE BY MOUTH DAILY   docusate sodium 100 MG capsule Commonly known as: Colace Take 1 capsule (100 mg total) by mouth 2 (two) times daily.   ferrous sulfate 325 (65 FE) MG tablet Commonly known as: FerrouSul Take 1 tablet (325 mg total) by mouth 3 (three) times daily with meals for 14 days.   levothyroxine 125 MCG tablet Commonly known as: SYNTHROID Take 125 mcg by mouth daily before breakfast.   methocarbamol 500 MG tablet Commonly known as: Robaxin Take 1 tablet (500 mg total) by mouth every 6  (six) hours as needed for muscle spasms.   metoprolol tartrate 50 MG tablet Commonly known as: LOPRESSOR TAKE 1 TABLET BY MOUTH TWICE DAILY   multivitamin with minerals Tabs tablet Take 1 tablet by mouth daily.   oxyCODONE 5 MG immediate release tablet Commonly known as: Oxy IR/ROXICODONE Take 1-2 tablets (5-10 mg total) by mouth every 4 (four) hours as needed for moderate pain or severe pain. What changed: Another medication with the same name was removed. Continue taking this medication, and follow the directions you see here.   polyethylene glycol 17 g packet Commonly known as: MIRALAX / GLYCOLAX Take 17 g by mouth 2 (two) times daily.   rivaroxaban 20 MG Tabs tablet Commonly known as: Xarelto Take 1 tablet (20 mg total) by mouth daily with breakfast.   vancomycin  IVPB Inject 1,250 mg into the vein every 12 (twelve) hours. Indication:  PJI Last Day of Therapy:  05/16/2019 Labs - Sunday/Monday:  CBC/D, BMP, and vancomycin trough. Labs - Thursday:  BMP and vancomycin trough Labs - Every other week:  ESR and CRP            Home Infusion Instuctions  (From admission, onward)         Start     Ordered   04/05/19 0000  Home infusion instructions    Question:  Instructions  Answer:  Flushing of vascular access device: 0.9% NaCl pre/post medication administration and prn patency; Heparin 100 u/ml, 63m for implanted ports and Heparin 10u/ml, 519mfor all other central venous catheters.   04/05/19 0754           Discharge Care Instructions  (From admission, onward)         Start     Ordered   04/05/19 0000  Change dressing    Comments: Maintain surgical dressing until follow up in the clinic. If the edges start to pull up, may reinforce with tape. If the dressing is no longer working, may remove and cover  with gauze and tape, but must keep the area dry and clean.  Call with any questions or concerns.   04/05/19 0834   04/05/19 0000  Partial weight bearing    Question  Answer Comment  % Body Weight 50   Laterality left   Extremity Lower      04/05/19 0834           Signed: [place] Pugh. Elizebath Wever   PA-C  04/10/2019, 8:22 AM

## 2019-05-10 DIAGNOSIS — I509 Heart failure, unspecified: Secondary | ICD-10-CM

## 2019-05-10 HISTORY — DX: Heart failure, unspecified: I50.9

## 2019-05-10 NOTE — H&P (Signed)
TOTAL KNEE REVISION ADMISSION H&P  Patient is being admitted for left total knee irrigation and debridement with possible polyethylene exchange  Subjective:  Chief Complaint: Persistent left knee drainage, s/p left TKA revision/reimplantation  HPI: [name], 69 y.o. 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 He has had multiple revision arthroplasties, including resection with antibiotic spacers. Most recently, he underwent left total knee revision arthroplasty with placement of PICC line and a 6 week course of home antibiotic therapy. He was seen in the clinic post-operatively with persistent serous drainage. Dr. Alvan Dame discussed treatment options with him, including risks, benefits, and expectations. We will plan for repeat irrigation and debridement with possible polyethylene exchange. He will have a new PICC line placed during hospitalization, with plans for home IV antibiotic therapy.   Patient Active Problem List   Diagnosis Date Noted  . S/P left TK revision 04/03/2019  . Administration of long-term prophylactic antibiotics   . History of streptococcal infection   . History of DVT (deep vein thrombosis)   . S/P rev left TK 12/15/2015  . 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 Preoperative cardiovascular examination 04/17/2013  . OSA on CPAP 11/27/2012  . Chronic diastolic heart failure ([place]) 09/10/2012  . Chronic anticoagulation, with Xarelto 09/10/2012  . DVT (deep venous thrombosis), possible 09/10/2012  . SOB (shortness of breath) 08/26/2012  . Chest discomfort 08/26/2012  . Persistent atrial fibrillation ([place]) 08/26/2012  . Super obesity 06/27/2012  . Expected blood loss anemia 06/27/2012  . S/P left TK revision 06/23/2012  . Septic arthritis of knee ([place])   . Cellulitis 09/19/2010  . METHICILLIN  RESISTANT STAPHYLOCOCCUS AUREUS INFECTION 09/10/2009  . 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 DM 07/04/2008  . CHRONIC KIDNEY DISEASE UNSPECIFIED 07/04/2008  . PULMONARY EMBOLISM, HX OF 07/04/2008  . INFECTION DUE TO INTERNAL ORTH DEVICE NEC 03/02/2006   Past Medical History:  Diagnosis Date  . CHF (congestive heart failure) ([place])   . Chronic anticoagulation    with Xarelto  . Complication of anesthesia    PT STATES HARD TO WAKE UP AFTER ONE SUGERY -STATES THE SURGERY TOOK LONGER THAN EXPECTED.  NO PROBLEMS WITH ANY OTHER SURGERY  . Complication of anesthesia    in 12/2018-states started convulsing after surgery, could not get warm   . Dysrhythmia    A-fib  . GERD (gastroesophageal reflux disease)   . Headache   . History of blood clots   . History of blood transfusion   . Hypothyroidism   . Pain    BACK PAIN - PT ATTRIBUTES TO THE WAY HE WALKS DUE TO LEFT KNEE PROBLEM  . Persistent atrial fibrillation ([place])   . Septic arthritis of knee ([facility])    LEFT KNEE  . Shortness of breath    WITH EXERTION AND PAIN  . Sleep apnea    uses CPAP,    Past Surgical History:  Procedure Laterality Date  . 2010 REMOVAL OF LEFT TOTAL KNEE    . CARDIAC CATHETERIZATION    . CARDIOVERSION N/A 08/28/2012   Procedure: CARDIOVERSION;  Surgeon: Sanda Klein, MD;  Location: [place];  Service: Cardiovascular;  Laterality: N/A;  . CARDIOVERSION N/A 09/01/2012   Procedure: CARDIOVERSION;  Surgeon: Pixie Casino, MD;  [facility]: [facility];  Service: Cardiovascular;  Laterality: N/A;  . COLONOSCOPY N/A  06/13/2013   Procedure: COLONOSCOPY;  Surgeon: Inda Castle, MD;  Location: [facility];  Service: Endoscopy;  Laterality: N/A;  . EXCISIONAL TOTAL KNEE ARTHROPLASTY WITH ANTIBIOTIC SPACERS Left 09/21/2018   Procedure: Resection of tibia versus both components with placement of antibiotic spacer;  Surgeon: Paralee Cancel, MD;  Location: [facility];  Service: Orthopedics;  Laterality: Left;   2.5 hrs  . EXCISIONAL TOTAL KNEE ARTHROPLASTY WITH ANTIBIOTIC SPACERS Left 01/02/2019   Procedure: Repeat Washout and placement of antibiotic spacer left knee;  Surgeon: Paralee Cancel, MD;  Location: [facility];  Service: Orthopedics;  Laterality: Left;  2 hrs  . HYDROCELECTOY   2012  . IRRIGATION AND DEBRIDEMENT KNEE Left 04/03/2019   Procedure: REIMPLANTATION OF LEFT KNEE TIBIAL COMPONENT.;  Surgeon: Paralee Cancel, MD;  Location: [facility];  Service: Orthopedics;  Laterality: Left;  need 120 min  . LEFT KNEE ARTHROSCOPY  1999  . LEFT KNEE SURGERY UPPER TIBIAL OSTOMY    . LEFT TOTAL KNEE REMOVAL FOR INFECTION  2006  . REIMPLANTATION LEFT TOTAL KNEE  2008  . REIMPLANTATION LEFT TOTAL KNEE   2006  . REIMPLANTATION OF TOTAL KNEE Left 06/23/2012   Procedure: REIMPLANTATION OF LEFT TOTAL KNEE;  Surgeon: Mauri Pole, MD;  Location: [facility];  Service: Orthopedics;  Laterality: Left;  . REMOVAL LEFT TOTAL KNEE   2008  . REPLACEMENT LEFT KNEE  2002  . REPLACEMENT RIGHT KNEE  2003  . REVISION LEFT KNEE CAP   2004  . RIGHT KNEE ARTHROSCOPY  1998  . TEE WITHOUT CARDIOVERSION N/A 08/28/2012   Procedure: TRANSESOPHAGEAL ECHOCARDIOGRAM (TEE);  Surgeon: Sanda Klein, MD;  Location: [facility];  Service: Cardiovascular;  Laterality: N/A;  . TONSILLECTOMY    . TOTAL KNEE REVISION Left 12/15/2015   Procedure: TOTAL KNEE REVISION REPLACEMENT;  Surgeon: Paralee Cancel, MD;  Location: [facility];  Service: Orthopedics;  Laterality: Left;  Adductor Block    No current facility-administered medications for this encounter.   Current Outpatient Medications  Medication Sig Dispense Refill Last Dose  . acetaminophen (TYLENOL) 500 MG tablet Take 2 tablets (1,000 mg total) by mouth every 8 (eight) hours. 30 tablet 0   . cefTRIAXone (ROCEPHIN) IVPB Inject 2 g into the vein daily. Indication:  PJI Last Day of Therapy:  05/16/2019 Labs - Once weekly:  CBC/D and BMP, Labs - Every other week:  ESR and CRP 42 Units 0   . diltiazem  (CARDIZEM CD) 240 MG 24 hr capsule TAKE 1 CAPSULE BY MOUTH DAILY (Patient taking differently: Take 240 mg by mouth daily. ) 90 capsule 3   . docusate sodium (COLACE) 100 MG capsule Take 1 capsule (100 mg total) by mouth 2 (two) times daily. 28 capsule 0   . ferrous sulfate (FERROUSUL) 325 (65 FE) MG tablet Take 1 tablet (325 mg total) by mouth 3 (three) times daily with meals for 14 days. 42 tablet 0   . levothyroxine (SYNTHROID) 125 MCG tablet Take 125 mcg by mouth daily before breakfast.      . methocarbamol (ROBAXIN) 500 MG tablet Take 1 tablet (500 mg total) by mouth every 6 (six) hours as needed for muscle spasms. 40 tablet 0   . metoprolol tartrate (LOPRESSOR) 50 MG tablet TAKE 1 TABLET BY MOUTH TWICE DAILY (Patient taking differently: Take 50 mg by mouth 2 (two) times daily. ) 180 tablet 2   . Multiple Vitamin (MULTIVITAMIN WITH MINERALS) TABS Take 1 tablet by mouth daily.     Marland Kitchen oxyCODONE (  OXY IR/ROXICODONE) 5 MG immediate release tablet Take 1-2 tablets (5-10 mg total) by mouth every 4 (four) hours as needed for moderate pain or severe pain. 60 tablet 0   . polyethylene glycol (MIRALAX / GLYCOLAX) 17 g packet Take 17 g by mouth 2 (two) times daily. 28 packet 0   . rivaroxaban (XARELTO) 20 MG TABS tablet Take 1 tablet (20 mg total) by mouth daily with breakfast. 90 tablet 3   . vancomycin IVPB Inject 1,250 mg into the vein every 12 (twelve) hours. Indication:  PJI Last Day of Therapy:  05/16/2019 Labs - Sunday/Monday:  CBC/D, BMP, and vancomycin trough. Labs - Thursday:  BMP and vancomycin trough Labs - Every other week:  ESR and CRP 84 Units 0    Allergies  Allergen Reactions  . Morphine Hives    Social History   Tobacco Use  . Smoking status: Never Smoker  . Smokeless tobacco: Never Used  Substance Use Topics  . Alcohol use: Yes    Comment: 1-2 drinks per month , 09-15-2018 "even less than that now "    Family History  Problem Relation Age of Onset  . Heart disease Father 25  .  Pancreatic cancer Sister   . Liver cancer Sister   . Cervical cancer Sister   . Breast cancer Sister   . Diabetes Sister   . Colon cancer Neg Hx   . Throat cancer Neg Hx   . Stomach cancer Neg Hx   . Kidney disease Neg Hx   . Liver disease Neg Hx       Review of Systems  Constitutional: Negative for chills and fever.  Respiratory: Negative for cough and shortness of breath.   Gastrointestinal: Negative for nausea and vomiting.  Musculoskeletal: Positive for arthralgias.     Objective:  Physical Exam  Last reported BMI 68 Pleasant 69 year old male awake alert and oriented. He uses assist device for mobilization.  Left knee exam: His left knee does continue to have some swelling on it. There is no erythema. The distal aspect of his incision with sutures in place as well as glue has a very small persistent serous drainage. No active signs of purulence Though he has significant left lower extremity venous skin changes there is no active concern for cellulitis  Vital signs in last 24 hours:  Labs:  Estimated body mass index is 56.71 kg/m as calculated from the following:   Height as of 04/03/19: '5\' 8"'  (1.727 m).   Weight as of 04/03/19: 169.2 kg.  Imaging Review Plain radiographs demonstrate stable femoral component without evidence of obvious loosening. Cemented tibial component appears to be stable.    Assessment/Plan:  History of multiple left total knee revisions due to infection, with persistent serous drainage  The patient history, physical examination, clinical judgment of the provider and imaging studies are consistent with persistent serous drainage of the left knee(s), previous total knee arthroplasty. Left knee irrigation and debridement with polyethylene exchange is deemed medically necessary. The treatment options including medical management, injection therapy, arthroscopy and revision arthroplasty were discussed at length. The risks and benefits of I&D with  polyethylene exchange were presented and reviewed. The risks due to aseptic loosening, infection, stiffness, patella tracking problems, thromboembolic complications and other imponderables were discussed. The patient acknowledged the explanation, agreed to proceed with the plan and consent was signed. Patient is being admitted for inpatient treatment for surgery, pain control, PT, OT, prophylactic antibiotics, VTE prophylaxis, progressive ambulation and ADL's and discharge planning.The  patient is planning to be discharged home.   Therapy Plans: To be determined after surgery Disposition: Home alone with neighbors helping out Planned DVT Prophylaxis: Xarelto 1m daily (hx of a fib) DME needed: none PCP: Dr. BColetta MemosCardiology: JKathyrn Drown NP TXA: IV Allergies: morphine - hives Anesthesia Concerns: none BMI: 56.7  Other: Hx of DVT/PE, CHF. Did okay with Norco last time.  - Patient was instructed on what medications to stop prior to surgery. - Follow-up visit in 2 weeks with Dr. OAlvan Dame- Begin physical therapy following surgery - Pre-operative lab work as pre-surgical testing - Prescriptions will be provided in hospital at time of discharge  AGriffith Citron PA-C Orthopedic Surgery [facility] [place]([phone]

## 2019-05-14 ENCOUNTER — Encounter (HOSPITAL_COMMUNITY)
Admission: RE | Admit: 2019-05-14 | Discharge: 2019-05-14 | Disposition: A | Discharge status: Home / Self Care | Payer: Medicare Other | Source: Ambulatory Visit | Attending: Orthopedic Surgery | Admitting: Orthopedic Surgery

## 2019-05-14 ENCOUNTER — Other Ambulatory Visit (HOSPITAL_COMMUNITY)
Admission: RE | Admit: 2019-05-14 | Discharge: 2019-05-14 | Disposition: A | Discharge status: Home / Self Care | Payer: Medicare Other | Source: Ambulatory Visit | Attending: Orthopedic Surgery | Admitting: Orthopedic Surgery

## 2019-05-14 ENCOUNTER — Other Ambulatory Visit: Payer: Self-pay

## 2019-05-14 DIAGNOSIS — I4819 Other persistent atrial fibrillation: Secondary | ICD-10-CM | POA: Insufficient documentation

## 2019-05-14 DIAGNOSIS — Z01812 Encounter for preprocedural laboratory examination: Secondary | ICD-10-CM | POA: Insufficient documentation

## 2019-05-14 DIAGNOSIS — N189 Chronic kidney disease, unspecified: Secondary | ICD-10-CM | POA: Insufficient documentation

## 2019-05-14 DIAGNOSIS — I5032 Chronic diastolic (congestive) heart failure: Secondary | ICD-10-CM | POA: Insufficient documentation

## 2019-05-14 DIAGNOSIS — Z96652 Presence of left artificial knee joint: Secondary | ICD-10-CM | POA: Insufficient documentation

## 2019-05-14 DIAGNOSIS — Z20822 Contact with and (suspected) exposure to covid-19: Secondary | ICD-10-CM | POA: Insufficient documentation

## 2019-05-14 DIAGNOSIS — Z7901 Long term (current) use of anticoagulants: Secondary | ICD-10-CM | POA: Insufficient documentation

## 2019-05-14 DIAGNOSIS — G4733 Obstructive sleep apnea (adult) (pediatric): Secondary | ICD-10-CM | POA: Insufficient documentation

## 2019-05-14 DIAGNOSIS — E039 Hypothyroidism, unspecified: Secondary | ICD-10-CM | POA: Insufficient documentation

## 2019-05-14 DIAGNOSIS — Z79899 Other long term (current) drug therapy: Secondary | ICD-10-CM | POA: Insufficient documentation

## 2019-05-14 DIAGNOSIS — Z79890 Hormone replacement therapy (postmenopausal): Secondary | ICD-10-CM | POA: Insufficient documentation

## 2019-05-14 LAB — SARS CORONAVIRUS 2 (TAT 6-24 HRS): SARS Coronavirus 2: NEGATIVE

## 2019-05-14 LAB — COMPREHENSIVE METABOLIC PANEL
ALT: 13 U/L (ref 0–44)
AST: 17 U/L (ref 15–41)
Albumin: 3.4 g/dL — ABNORMAL LOW (ref 3.5–5.0)
Alkaline Phosphatase: 90 U/L (ref 38–126)
Anion gap: 9 (ref 5–15)
BUN: 24 mg/dL — ABNORMAL HIGH (ref 8–23)
CO2: 22 mmol/L (ref 22–32)
Calcium: 9.2 mg/dL (ref 8.9–10.3)
Chloride: 110 mmol/L (ref 98–111)
Creatinine, Ser: 1.03 mg/dL (ref 0.61–1.24)
GFR calc Af Amer: >60 mL/min (ref >60)
GFR calc non Af Amer: >60 mL/min (ref >60)
Glucose, Bld: 128 mg/dL — ABNORMAL HIGH (ref 70–99)
Potassium: 4 mmol/L (ref 3.5–5.1)
Sodium: 141 mmol/L (ref 135–145)
Total Bilirubin: 0.6 mg/dL (ref 0.3–1.2)
Total Protein: 6.8 g/dL (ref 6.5–8.1)

## 2019-05-14 LAB — PROTIME-INR
INR: 1.2 (ref 0.8–1.2)
Prothrombin Time: 15.2 seconds (ref 11.4–15.2)

## 2019-05-14 LAB — APTT: aPTT: 27 seconds (ref 24–36)

## 2019-05-14 NOTE — Patient Instructions (Addendum)
DUE TO COVID-19 ONLY ONE VISITOR IS ALLOWED TO COME WITH YOU AND STAY IN THE WAITING ROOM ONLY DURING PRE OP AND PROCEDURE DAY OF SURGERY. THE 1 VISITOR MAY VISIT WITH YOU AFTER SURGERY IN YOUR PRIVATE ROOM DURING VISITING HOURS ONLY!  YOU NEED TO HAVE A COVID 19 TEST ON_______ @_______ , THIS TEST MUST BE DONE BEFORE SURGERY, COME  [place], [address] , [id number].  ([place]) ONCE YOUR COVID TEST IS COMPLETED, PLEASE BEGIN THE QUARANTINE INSTRUCTIONS AS OUTLINED IN YOUR HANDOUT.                [name]  05/14/2019   Your procedure is scheduled on: 05-17-19   Report to [facility], The Main  Entrance   Report to admitting at     1030  AM     Call this number if you have problems the morning of surgery   [place], NO CHEWING GUM CANDY OR MINTS.  Do not eat food After Midnight.   YOU MAY HAVE CLEAR LIQUIDS FROM MIDNIGHT UNTIL 4:30AM.   At 4:30AM Please finish the prescribed Pre-Surgery Gatorade drink  . Nothing by mouth after you finish the Gatorade drink !   Take these medicines the morning of surgery with A SIP OF WATER: Metoprolol, Diltiazem, Synthroid, antibiotics. Follow MD instructions about Xarelto and when to hold it before surgery                                 You may not have any metal on your body including               piercings  Do not wear jewelry,  lotions, powders or  deodorant                     Men may shave face and neck.   Do not bring valuables to the hospital. [place].  Contacts, dentures or bridgework may not be worn into surgery.  Leave suitcase in the car. After surgery it may be brought to your room.     HOURS.  Name and phone number of your driver:  Special Instructions: N/A              Please read over the following fact sheets you were  given: _____________________________________________________________________             [facility] - Preparing for Surgery Before surgery, you can play an important role.   Because skin is not sterile, your skin needs to be as free of germs as possible.   You can reduce the number of germs on your skin by washing with CHG (chlorahexidine gluconate) soap before surgery.   CHG is an antiseptic cleaner which kills germs and bonds with the skin to continue killing germs even after washing. Please DO NOT use if you have an allergy to CHG or antibacterial soaps.   If your skin becomes reddened/irritated stop using the CHG and inform your nurse when you arrive at Short Stay. Do not shave (including legs and underarms) for at least 48 hours prior to the first CHG shower.  Please follow these instructions carefully:  1.  Shower with CHG Soap the night before  surgery and the  morning of Surgery.  2.  If you choose to wash your hair, wash your hair first as usual with your  normal  shampoo.  3.  After you shampoo, rinse your hair and body thoroughly to remove the  shampoo.                                         4.  Use CHG as you would any other liquid soap.  You can apply chg directly  to the skin and wash                       Gently with a scrungie or clean washcloth.  5.  Apply the CHG Soap to your body ONLY FROM THE NECK DOWN.   Do not use on face/ open                           Wound or open sores. Avoid contact with eyes, ears mouth and genitals (private parts).                       Wash face,  Genitals (private parts) with your normal soap.             6.  Wash thoroughly, paying special attention to the area where your surgery  will be performed.  7.  Thoroughly rinse your body with warm water from the neck down.  8.  DO NOT shower/wash with your normal soap after using and rinsing off  the CHG Soap.             9.  Pat yourself dry with a clean towel.            10.  Wear clean  pajamas.            11.  Place clean sheets on your bed the night of your first shower and do not  sleep with pets. Day of Surgery : Do not apply any lotions/deodorants the morning of surgery.  Please wear clean clothes to the hospital/surgery center.  FAILURE TO FOLLOW THESE INSTRUCTIONS MAY RESULT IN THE CANCELLATION OF YOUR SURGERY PATIENT SIGNATURE_________________________________  NURSE SIGNATURE__________________________________  ________________________________________________________________________   [name]  An incentive spirometer is a tool that can help keep your lungs clear and active. This tool measures how well you are filling your lungs with each breath. Taking long deep breaths may help reverse or decrease the chance of developing breathing (pulmonary) problems (especially infection) following:  A long period of time when you are unable to move or be active. BEFORE THE PROCEDURE   If the spirometer includes an indicator to show your best effort, your nurse or respiratory therapist will set it to a desired goal.  If possible, sit up straight or lean slightly forward. Try not to slouch.  Hold the incentive spirometer in an upright position. INSTRUCTIONS FOR USE  1. Sit on the edge of your bed if possible, or sit up as far as you can in bed or on a chair. 2. Hold the incentive spirometer in an upright position. 3. Breathe out normally. 4. Place the mouthpiece in your mouth and seal your lips tightly around it. 5. Breathe in slowly and as deeply as possible, raising the piston or the ball toward the top of  the column. 6. Hold your breath for 3-5 seconds or for as long as possible. Allow the piston or ball to fall to the bottom of the column. 7. Remove the mouthpiece from your mouth and breathe out normally. 8. Rest for a few seconds and repeat Steps 1 through 7 at least 10 times every 1-2 hours when you are awake. Take your time and take a few normal breaths  between deep breaths. 9. The spirometer may include an indicator to show your best effort. Use the indicator as a goal to work toward during each repetition. 10. After each set of 10 deep breaths, practice coughing to be sure your lungs are clear. If you have an incision (the cut made at the time of surgery), support your incision when coughing by placing a pillow or rolled up towels firmly against it. Once you are able to get out of bed, walk around indoors and cough well. You may stop using the incentive spirometer when instructed by your caregiver.  RISKS AND COMPLICATIONS  Take your time so you do not get dizzy or light-headed.  If you are in pain, you may need to take or ask for pain medication before doing incentive spirometry. It is harder to take a deep breath if you are having pain. AFTER USE  Rest and breathe slowly and easily.  It can be helpful to keep track of a log of your progress. Your caregiver can provide you with a simple table to help with this. If you are using the spirometer at home, follow these instructions: [place] IF:   You are having difficultly using the spirometer.  You have trouble using the spirometer as often as instructed.  Your pain medication is not giving enough relief while using the spirometer.  You develop fever of 100.5 F (38.1 C) or higher. SEEK IMMEDIATE MEDICAL CARE IF:   You cough up bloody sputum that had not been present before.  You develop fever of 102 F (38.9 C) or greater.  You develop worsening pain at or near the incision site. MAKE SURE YOU:   Understand these instructions.  Will watch your condition.  Will get help right away if you are not doing well or get worse. Document Released: 06/07/2006 Document Revised: 04/19/2011 Document Reviewed: 08/08/2006 [facility] Patient Information 2014 [place], [place].   ________________________________________________________________________

## 2019-05-15 NOTE — Progress Notes (Signed)
PCP - Bernerd Limbo   Clearance 12-21-18  Cardiologist - Croitoru, Mihai  Clearance 01-02-19 epic for knee surgery  Chest x-ray -  EKG - 01-02-19 epic Stress Test -  ECHO -  Cardiac Cath -  Cbc ,cmp, sed rate on chart 05-15-19   Sleep Study -  CPAP - YES  Fasting Blood Sugar -  Checks Blood Sugar _____ times a day  Blood Thinner Instructions: Xarelto Aspirin Instructions: Last Dose:  Anesthesia review: A fib  OSA   Patient denies shortness of breath, fever, cough and chest pain at PAT appointment  NONE   Patient verbalized understanding of instructions that were given to them at the PAT appointment. Patient was also instructed that they will need to review over the PAT instructions again at home before surgery.

## 2019-05-16 ENCOUNTER — Encounter (HOSPITAL_COMMUNITY): Payer: Self-pay

## 2019-05-16 ENCOUNTER — Other Ambulatory Visit: Payer: Self-pay

## 2019-05-16 ENCOUNTER — Encounter (HOSPITAL_COMMUNITY)
Admission: RE | Admit: 2019-05-16 | Discharge: 2019-05-16 | Disposition: A | Discharge status: Home / Self Care | Payer: Medicare Other | Source: Ambulatory Visit | Attending: Orthopedic Surgery | Admitting: Orthopedic Surgery

## 2019-05-16 HISTORY — DX: Pneumonia, unspecified organism: J18.9

## 2019-05-16 MED ORDER — DEXTROSE 5 % IV SOLN
3.0000 g | INTRAVENOUS | Status: AC
  Administered 2019-05-17: 3 g via INTRAVENOUS
  Filled 2019-05-16: qty 3

## 2019-05-17 ENCOUNTER — Inpatient Hospital Stay (HOSPITAL_COMMUNITY)
Admission: RE | Admit: 2019-05-17 | Discharge: 2019-05-21 | DRG: 486 | Disposition: A | Discharge status: Home Health | Payer: Medicare Other | Attending: Orthopedic Surgery | Admitting: Orthopedic Surgery

## 2019-05-17 ENCOUNTER — Inpatient Hospital Stay (HOSPITAL_COMMUNITY): Payer: Medicare Other | Admitting: Physician Assistant

## 2019-05-17 ENCOUNTER — Encounter (HOSPITAL_COMMUNITY)
Admission: RE | Disposition: A | Discharge status: Home / Self Care | Payer: Self-pay | Source: Home / Self Care | Attending: Orthopedic Surgery

## 2019-05-17 ENCOUNTER — Encounter (HOSPITAL_COMMUNITY): Payer: Self-pay | Admitting: Orthopedic Surgery

## 2019-05-17 ENCOUNTER — Inpatient Hospital Stay: Payer: Self-pay

## 2019-05-17 DIAGNOSIS — Z20822 Contact with and (suspected) exposure to covid-19: Secondary | ICD-10-CM | POA: Diagnosis not present

## 2019-05-17 DIAGNOSIS — Z96653 Presence of artificial knee joint, bilateral: Secondary | ICD-10-CM | POA: Diagnosis present

## 2019-05-17 DIAGNOSIS — K219 Gastro-esophageal reflux disease without esophagitis: Secondary | ICD-10-CM | POA: Diagnosis present

## 2019-05-17 DIAGNOSIS — Z6841 Body Mass Index (BMI) 40.0 and over, adult: Secondary | ICD-10-CM

## 2019-05-17 DIAGNOSIS — G4733 Obstructive sleep apnea (adult) (pediatric): Secondary | ICD-10-CM | POA: Diagnosis present

## 2019-05-17 DIAGNOSIS — Y838 Other surgical procedures as the cause of abnormal reaction of the patient, or of later complication, without mention of misadventure at the time of the procedure: Secondary | ICD-10-CM | POA: Diagnosis present

## 2019-05-17 DIAGNOSIS — Z7901 Long term (current) use of anticoagulants: Secondary | ICD-10-CM

## 2019-05-17 DIAGNOSIS — Z885 Allergy status to narcotic agent status: Secondary | ICD-10-CM

## 2019-05-17 DIAGNOSIS — Z79890 Hormone replacement therapy (postmenopausal): Secondary | ICD-10-CM

## 2019-05-17 DIAGNOSIS — Z86718 Personal history of other venous thrombosis and embolism: Secondary | ICD-10-CM | POA: Diagnosis not present

## 2019-05-17 DIAGNOSIS — Z86711 Personal history of pulmonary embolism: Secondary | ICD-10-CM

## 2019-05-17 DIAGNOSIS — T8459XD Infection and inflammatory reaction due to other internal joint prosthesis, subsequent encounter: Secondary | ICD-10-CM

## 2019-05-17 DIAGNOSIS — I5032 Chronic diastolic (congestive) heart failure: Secondary | ICD-10-CM | POA: Diagnosis not present

## 2019-05-17 DIAGNOSIS — E039 Hypothyroidism, unspecified: Secondary | ICD-10-CM | POA: Diagnosis present

## 2019-05-17 DIAGNOSIS — M25562 Pain in left knee: Secondary | ICD-10-CM | POA: Diagnosis present

## 2019-05-17 DIAGNOSIS — T8454XA Infection and inflammatory reaction due to internal left knee prosthesis, initial encounter: Principal | ICD-10-CM | POA: Diagnosis present

## 2019-05-17 DIAGNOSIS — Z79899 Other long term (current) drug therapy: Secondary | ICD-10-CM | POA: Diagnosis not present

## 2019-05-17 DIAGNOSIS — N189 Chronic kidney disease, unspecified: Secondary | ICD-10-CM | POA: Diagnosis present

## 2019-05-17 DIAGNOSIS — Z96659 Presence of unspecified artificial knee joint: Secondary | ICD-10-CM

## 2019-05-17 DIAGNOSIS — Z96652 Presence of left artificial knee joint: Secondary | ICD-10-CM

## 2019-05-17 DIAGNOSIS — I4891 Unspecified atrial fibrillation: Secondary | ICD-10-CM | POA: Diagnosis present

## 2019-05-17 HISTORY — PX: I & D KNEE WITH POLY EXCHANGE: SHX5024

## 2019-05-17 LAB — TYPE AND SCREEN
ABO/RH(D): O POS
Antibody Screen: NEGATIVE

## 2019-05-17 LAB — GLUCOSE, CAPILLARY: Glucose-Capillary: 98 mg/dL (ref 70–99)

## 2019-05-17 SURGERY — IRRIGATION AND DEBRIDEMENT KNEE WITH POLY EXCHANGE
Anesthesia: Spinal | Site: Knee | Laterality: Left

## 2019-05-17 MED ORDER — PROPOFOL 500 MG/50ML IV EMUL
INTRAVENOUS | Status: AC
  Filled 2019-05-17: qty 50

## 2019-05-17 MED ORDER — DILTIAZEM HCL ER COATED BEADS 240 MG PO CP24
240.0000 mg | ORAL_CAPSULE | Freq: Every day | ORAL | Status: DC
  Administered 2019-05-18 – 2019-05-21 (×4): 240 mg via ORAL
  Filled 2019-05-17 (×4): qty 1

## 2019-05-17 MED ORDER — PHENOL 1.4 % MT LIQD: 1.0000 | OROMUCOSAL | Status: DC | PRN

## 2019-05-17 MED ORDER — LEVOTHYROXINE SODIUM 125 MCG PO TABS
125.0000 ug | ORAL_TABLET | Freq: Every day | ORAL | Status: DC
  Administered 2019-05-18 – 2019-05-21 (×4): 125 ug via ORAL
  Filled 2019-05-17 (×4): qty 1

## 2019-05-17 MED ORDER — VANCOMYCIN IV (FOR PTA / DISCHARGE USE ONLY): 1250.0000 mg | Freq: Two times a day (BID) | INTRAVENOUS | Status: DC

## 2019-05-17 MED ORDER — PROMETHAZINE HCL 25 MG/ML IJ SOLN: 6.2500 mg | INTRAMUSCULAR | Status: DC | PRN

## 2019-05-17 MED ORDER — METHOCARBAMOL 500 MG PO TABS
500.0000 mg | ORAL_TABLET | Freq: Four times a day (QID) | ORAL | Status: DC | PRN
  Administered 2019-05-17 – 2019-05-20 (×6): 500 mg via ORAL
  Filled 2019-05-17 (×6): qty 1

## 2019-05-17 MED ORDER — SODIUM CHLORIDE (PF) 0.9 % IJ SOLN
INTRAMUSCULAR | Status: AC
  Filled 2019-05-17: qty 50

## 2019-05-17 MED ORDER — VANCOMYCIN HCL 1000 MG IV SOLR
INTRAVENOUS | Status: AC
  Filled 2019-05-17: qty 1000

## 2019-05-17 MED ORDER — DEXAMETHASONE SODIUM PHOSPHATE 10 MG/ML IJ SOLN
10.0000 mg | Freq: Once | INTRAMUSCULAR | Status: AC
  Administered 2019-05-18: 10 mg via INTRAVENOUS
  Filled 2019-05-17: qty 1

## 2019-05-17 MED ORDER — ALUM & MAG HYDROXIDE-SIMETH 200-200-20 MG/5ML PO SUSP: 15.0000 mL | ORAL | Status: DC | PRN

## 2019-05-17 MED ORDER — OXYCODONE HCL 5 MG PO TABS
10.0000 mg | ORAL_TABLET | ORAL | Status: DC | PRN
  Filled 2019-05-17: qty 2

## 2019-05-17 MED ORDER — BUPIVACAINE IN DEXTROSE 0.75-8.25 % IT SOLN
INTRATHECAL | Status: DC | PRN
  Administered 2019-05-17: 1.8 mL via INTRATHECAL

## 2019-05-17 MED ORDER — FENTANYL CITRATE (PF) 100 MCG/2ML IJ SOLN: 25.0000 ug | INTRAMUSCULAR | Status: DC | PRN

## 2019-05-17 MED ORDER — SODIUM CHLORIDE 0.9 % IR SOLN
Status: DC | PRN
  Administered 2019-05-17: 3000 mL

## 2019-05-17 MED ORDER — CEFTRIAXONE IV (FOR PTA / DISCHARGE USE ONLY): 2.0000 g | INTRAVENOUS | Status: DC

## 2019-05-17 MED ORDER — BUPIVACAINE HCL (PF) 0.25 % IJ SOLN
INTRAMUSCULAR | Status: DC | PRN
  Administered 2019-05-17: 30 mL

## 2019-05-17 MED ORDER — MENTHOL 3 MG MT LOZG: 1.0000 | LOZENGE | OROMUCOSAL | Status: DC | PRN

## 2019-05-17 MED ORDER — METOPROLOL TARTRATE 50 MG PO TABS
50.0000 mg | ORAL_TABLET | Freq: Two times a day (BID) | ORAL | Status: DC
  Administered 2019-05-17 – 2019-05-21 (×8): 50 mg via ORAL
  Filled 2019-05-17 (×8): qty 1

## 2019-05-17 MED ORDER — MIDAZOLAM HCL 2 MG/2ML IJ SOLN
INTRAMUSCULAR | Status: AC
  Administered 2019-05-17: 2 mg
  Filled 2019-05-17: qty 2

## 2019-05-17 MED ORDER — CELECOXIB 200 MG PO CAPS
200.0000 mg | ORAL_CAPSULE | Freq: Once | ORAL | Status: AC
  Administered 2019-05-17: 200 mg via ORAL
  Filled 2019-05-17: qty 1

## 2019-05-17 MED ORDER — CELECOXIB 200 MG PO CAPS
200.0000 mg | ORAL_CAPSULE | Freq: Two times a day (BID) | ORAL | Status: DC
  Administered 2019-05-17 – 2019-05-21 (×8): 200 mg via ORAL
  Filled 2019-05-17 (×8): qty 1

## 2019-05-17 MED ORDER — FENTANYL CITRATE (PF) 100 MCG/2ML IJ SOLN
INTRAMUSCULAR | Status: AC
  Administered 2019-05-17: 100 ug
  Filled 2019-05-17: qty 2

## 2019-05-17 MED ORDER — ACETAMINOPHEN 325 MG PO TABS
325.0000 mg | ORAL_TABLET | Freq: Four times a day (QID) | ORAL | Status: DC | PRN
  Administered 2019-05-18 – 2019-05-21 (×4): 650 mg via ORAL
  Filled 2019-05-17 (×4): qty 2

## 2019-05-17 MED ORDER — SODIUM CHLORIDE 0.9 % IV SOLN
2.0000 g | INTRAVENOUS | Status: DC
  Administered 2019-05-17 – 2019-05-20 (×4): 2 g via INTRAVENOUS
  Filled 2019-05-17: qty 2
  Filled 2019-05-17 (×4): qty 20

## 2019-05-17 MED ORDER — EPHEDRINE SULFATE-NACL 50-0.9 MG/10ML-% IV SOSY
PREFILLED_SYRINGE | INTRAVENOUS | Status: DC | PRN
  Administered 2019-05-17: 10 mg via INTRAVENOUS

## 2019-05-17 MED ORDER — IRRISEPT - 450ML BOTTLE WITH 0.05% CHG IN STERILE WATER, USP 99.95% OPTIME
TOPICAL | Status: DC | PRN
  Administered 2019-05-17: 450 mL

## 2019-05-17 MED ORDER — ONDANSETRON HCL 4 MG/2ML IJ SOLN
INTRAMUSCULAR | Status: DC | PRN
  Administered 2019-05-17: 4 mg via INTRAVENOUS

## 2019-05-17 MED ORDER — PROPOFOL 10 MG/ML IV BOLUS
INTRAVENOUS | Status: DC | PRN
  Administered 2019-05-17 (×2): 10 mg via INTRAVENOUS
  Administered 2019-05-17: 20 mg via INTRAVENOUS

## 2019-05-17 MED ORDER — OXYCODONE HCL 5 MG PO TABS
5.0000 mg | ORAL_TABLET | ORAL | Status: DC | PRN
  Administered 2019-05-17 – 2019-05-19 (×4): 10 mg via ORAL
  Filled 2019-05-17 (×3): qty 2

## 2019-05-17 MED ORDER — MIDAZOLAM HCL 5 MG/5ML IJ SOLN
INTRAMUSCULAR | Status: DC | PRN
  Administered 2019-05-17 (×2): 1 mg via INTRAVENOUS

## 2019-05-17 MED ORDER — CEFAZOLIN SODIUM-DEXTROSE 2-4 GM/100ML-% IV SOLN: 2.0000 g | Freq: Four times a day (QID) | INTRAVENOUS | Status: DC

## 2019-05-17 MED ORDER — FERROUS SULFATE 325 (65 FE) MG PO TABS
325.0000 mg | ORAL_TABLET | Freq: Two times a day (BID) | ORAL | Status: DC
  Administered 2019-05-17 – 2019-05-21 (×8): 325 mg via ORAL
  Filled 2019-05-17 (×8): qty 1

## 2019-05-17 MED ORDER — METOCLOPRAMIDE HCL 5 MG/ML IJ SOLN: 5.0000 mg | Freq: Three times a day (TID) | INTRAMUSCULAR | Status: DC | PRN

## 2019-05-17 MED ORDER — LACTATED RINGERS IV SOLN: INTRAVENOUS | Status: DC

## 2019-05-17 MED ORDER — FENTANYL CITRATE (PF) 100 MCG/2ML IJ SOLN
INTRAMUSCULAR | Status: AC
  Filled 2019-05-17: qty 4

## 2019-05-17 MED ORDER — PROPOFOL 500 MG/50ML IV EMUL
INTRAVENOUS | Status: DC | PRN
  Administered 2019-05-17: 60 ug/kg/min via INTRAVENOUS
  Administered 2019-05-17: 50 ug/kg/min via INTRAVENOUS

## 2019-05-17 MED ORDER — VANCOMYCIN HCL 1000 MG IV SOLR
INTRAVENOUS | Status: DC | PRN
  Administered 2019-05-17: 1000 mg

## 2019-05-17 MED ORDER — FENTANYL CITRATE (PF) 100 MCG/2ML IJ SOLN
25.0000 ug | INTRAMUSCULAR | Status: DC | PRN
  Administered 2019-05-17 (×2): 50 ug via INTRAVENOUS

## 2019-05-17 MED ORDER — ONDANSETRON HCL 4 MG PO TABS: 4.0000 mg | ORAL_TABLET | Freq: Four times a day (QID) | ORAL | Status: DC | PRN

## 2019-05-17 MED ORDER — DIPHENHYDRAMINE HCL 12.5 MG/5ML PO ELIX: 12.5000 mg | ORAL_SOLUTION | ORAL | Status: DC | PRN

## 2019-05-17 MED ORDER — KETOROLAC TROMETHAMINE 30 MG/ML IJ SOLN
INTRAMUSCULAR | Status: AC
  Filled 2019-05-17: qty 1

## 2019-05-17 MED ORDER — VANCOMYCIN HCL 1250 MG/250ML IV SOLN
1250.0000 mg | Freq: Two times a day (BID) | INTRAVENOUS | Status: DC
  Administered 2019-05-17 – 2019-05-18 (×3): 1250 mg via INTRAVENOUS
  Filled 2019-05-17 (×4): qty 250

## 2019-05-17 MED ORDER — DOCUSATE SODIUM 100 MG PO CAPS
100.0000 mg | ORAL_CAPSULE | Freq: Two times a day (BID) | ORAL | Status: DC
  Administered 2019-05-17 – 2019-05-21 (×8): 100 mg via ORAL
  Filled 2019-05-17 (×8): qty 1

## 2019-05-17 MED ORDER — METHOCARBAMOL 500 MG IVPB - SIMPLE MED
500.0000 mg | Freq: Four times a day (QID) | INTRAVENOUS | Status: DC | PRN
  Filled 2019-05-17: qty 50

## 2019-05-17 MED ORDER — ONDANSETRON HCL 4 MG/2ML IJ SOLN: 4.0000 mg | Freq: Four times a day (QID) | INTRAMUSCULAR | Status: DC | PRN

## 2019-05-17 MED ORDER — BUPIVACAINE HCL (PF) 0.25 % IJ SOLN
INTRAMUSCULAR | Status: AC
  Filled 2019-05-17: qty 30

## 2019-05-17 MED ORDER — POLYETHYLENE GLYCOL 3350 17 G PO PACK
17.0000 g | PACK | Freq: Two times a day (BID) | ORAL | Status: DC
  Administered 2019-05-17 – 2019-05-21 (×8): 17 g via ORAL
  Filled 2019-05-17 (×9): qty 1

## 2019-05-17 MED ORDER — TRANEXAMIC ACID-NACL 1000-0.7 MG/100ML-% IV SOLN
1000.0000 mg | INTRAVENOUS | Status: AC
  Administered 2019-05-17: 1000 mg via INTRAVENOUS
  Filled 2019-05-17: qty 100

## 2019-05-17 MED ORDER — SODIUM CHLORIDE 0.9 % IV SOLN: INTRAVENOUS | Status: DC

## 2019-05-17 MED ORDER — POVIDONE-IODINE 10 % EX SWAB
2.0000 "application " | Freq: Once | CUTANEOUS | Status: AC
  Administered 2019-05-17: 2 via TOPICAL

## 2019-05-17 MED ORDER — PROPOFOL 1000 MG/100ML IV EMUL
INTRAVENOUS | Status: AC
  Filled 2019-05-17: qty 100

## 2019-05-17 MED ORDER — SODIUM CHLORIDE (PF) 0.9 % IJ SOLN
INTRAMUSCULAR | Status: DC | PRN
  Administered 2019-05-17: 30 mL

## 2019-05-17 MED ORDER — RIVAROXABAN 10 MG PO TABS
10.0000 mg | ORAL_TABLET | ORAL | Status: DC
  Administered 2019-05-18 – 2019-05-21 (×4): 10 mg via ORAL
  Filled 2019-05-17 (×5): qty 1

## 2019-05-17 MED ORDER — BISACODYL 10 MG RE SUPP
10.0000 mg | Freq: Every day | RECTAL | Status: DC | PRN
  Administered 2019-05-19: 10 mg via RECTAL
  Filled 2019-05-17: qty 1

## 2019-05-17 MED ORDER — DEXAMETHASONE SODIUM PHOSPHATE 10 MG/ML IJ SOLN
10.0000 mg | Freq: Once | INTRAMUSCULAR | Status: AC
  Administered 2019-05-17: 10 mg via INTRAVENOUS

## 2019-05-17 MED ORDER — MAGNESIUM CITRATE PO SOLN: 1.0000 | Freq: Once | ORAL | Status: DC | PRN

## 2019-05-17 MED ORDER — LACTATED RINGERS IV SOLN: INTRAVENOUS | Status: DC | PRN

## 2019-05-17 MED ORDER — KETOROLAC TROMETHAMINE 30 MG/ML IJ SOLN
INTRAMUSCULAR | Status: DC | PRN
  Administered 2019-05-17: 30 mg

## 2019-05-17 MED ORDER — ACETAMINOPHEN 500 MG PO TABS
1000.0000 mg | ORAL_TABLET | Freq: Once | ORAL | Status: AC
  Administered 2019-05-17: 1000 mg via ORAL
  Filled 2019-05-17: qty 2

## 2019-05-17 MED ORDER — METOCLOPRAMIDE HCL 5 MG PO TABS: 5.0000 mg | ORAL_TABLET | Freq: Three times a day (TID) | ORAL | Status: DC | PRN

## 2019-05-17 SURGICAL SUPPLY — 47 items
BAG ZIPLOCK 12X15 (MISCELLANEOUS) ×2 IMPLANT
BNDG ELASTIC 4X5.8 VLCR STR LF (GAUZE/BANDAGES/DRESSINGS) IMPLANT
BNDG ELASTIC 6X5.8 VLCR STR LF (GAUZE/BANDAGES/DRESSINGS) ×2 IMPLANT
CANISTER WOUNDNEG PRESSURE 500 (CANNISTER) ×2 IMPLANT
COVER SURGICAL LIGHT HANDLE (MISCELLANEOUS) ×2 IMPLANT
COVER WAND RF STERILE (DRAPES) IMPLANT
CUFF TOURN SGL QUICK 34 (TOURNIQUET CUFF) ×2
CUFF TRNQT CYL 34X4.125X (TOURNIQUET CUFF) ×1 IMPLANT
DECANTER SPIKE VIAL GLASS SM (MISCELLANEOUS) ×2 IMPLANT
DERMABOND ADVANCED (GAUZE/BANDAGES/DRESSINGS)
DERMABOND ADVANCED .7 DNX12 (GAUZE/BANDAGES/DRESSINGS) IMPLANT
DRAPE U-SHAPE 47X51 STRL (DRAPES) ×2 IMPLANT
DRESSING PREVENA PLUS CUSTOM (GAUZE/BANDAGES/DRESSINGS) ×1 IMPLANT
DRSG AQUACEL AG ADV 3.5X10 (GAUZE/BANDAGES/DRESSINGS) IMPLANT
DRSG AQUACEL AG ADV 3.5X14 (GAUZE/BANDAGES/DRESSINGS) IMPLANT
DRSG PREVENA PLUS CUSTOM (GAUZE/BANDAGES/DRESSINGS) ×2
DURAPREP 26ML APPLICATOR (WOUND CARE) ×4 IMPLANT
ELECT REM PT RETURN 15FT ADLT (MISCELLANEOUS) ×2 IMPLANT
GLOVE BIOGEL PI IND STRL 7.5 (GLOVE) ×1 IMPLANT
GLOVE BIOGEL PI IND STRL 8.5 (GLOVE) ×1 IMPLANT
GLOVE BIOGEL PI INDICATOR 7.5 (GLOVE) ×1
GLOVE BIOGEL PI INDICATOR 8.5 (GLOVE) ×1
GLOVE ECLIPSE 8.0 STRL XLNG CF (GLOVE) ×2 IMPLANT
GLOVE ORTHO TXT STRL SZ7.5 (GLOVE) ×2 IMPLANT
GOWN STRL REUS W/TWL LRG LVL3 (GOWN DISPOSABLE) ×2 IMPLANT
GOWN STRL REUS W/TWL XL LVL3 (GOWN DISPOSABLE) ×2 IMPLANT
HANDPIECE INTERPULSE COAX TIP (DISPOSABLE) ×2
INSERT SZ 5 22.5 (Knees) ×2 IMPLANT
KIT DRSG PREVENA PLUS 7DAY 125 (MISCELLANEOUS) ×2 IMPLANT
KIT TURNOVER KIT A (KITS) IMPLANT
MANIFOLD NEPTUNE II (INSTRUMENTS) ×2 IMPLANT
NS IRRIG 1000ML POUR BTL (IV SOLUTION) ×2 IMPLANT
PACK TOTAL KNEE CUSTOM (KITS) ×2 IMPLANT
PENCIL SMOKE EVACUATOR (MISCELLANEOUS) IMPLANT
PROTECTOR NERVE ULNAR (MISCELLANEOUS) ×2 IMPLANT
SET HNDPC FAN SPRY TIP SCT (DISPOSABLE) ×1 IMPLANT
STAPLER VISISTAT 35W (STAPLE) IMPLANT
SUT MNCRL AB 3-0 PS2 18 (SUTURE) IMPLANT
SUT STRATAFIX 0 PDS 27 VIOLET (SUTURE) ×2
SUT VIC AB 1 CT1 36 (SUTURE) ×2 IMPLANT
SUT VIC AB 2-0 CT1 27 (SUTURE) ×6
SUT VIC AB 2-0 CT1 TAPERPNT 27 (SUTURE) ×3 IMPLANT
SUTURE STRATFX 0 PDS 27 VIOLET (SUTURE) ×1 IMPLANT
SWAB COLLECTION DEVICE MRSA (MISCELLANEOUS) IMPLANT
SWAB CULTURE ESWAB REG 1ML (MISCELLANEOUS) IMPLANT
TRAY FOLEY MTR SLVR 16FR STAT (SET/KITS/TRAYS/PACK) ×2 IMPLANT
WRAP KNEE MAXI GEL POST OP (GAUZE/BANDAGES/DRESSINGS) ×2 IMPLANT

## 2019-05-17 NOTE — Anesthesia Procedure Notes (Addendum)
Spinal  Patient location during procedure: OR Start time: 05/17/2019 12:28 PM End time: 05/17/2019 12:38 PM Staffing Performed: anesthesiologist  Anesthesiologist: Duane Boston, MD Preanesthetic Checklist Completed: patient identified, IV checked, risks and benefits discussed, surgical consent, monitors and equipment checked, pre-op evaluation and timeout performed Spinal Block Patient position: sitting Prep: DuraPrep Patient monitoring: cardiac monitor, continuous pulse ox and blood pressure Approach: midline Location: L2-3 Injection technique: single-shot Needle Needle type: Pencan  Needle gauge: 24 G Needle length: 15 cm Additional Notes Functioning IV was confirmed and monitors were applied. Sterile prep and drape, including hand hygiene and sterile gloves were used. The patient was positioned and the spine was prepped. The skin was anesthetized with lidocaine.  Free flow of clear CSF was obtained prior to injecting local anesthetic into the CSF.  The spinal needle aspirated freely following injection.  The needle was carefully withdrawn.  The patient tolerated the procedure well. Unable to locate SA space with 10 cm needle, successful with 15cm.

## 2019-05-17 NOTE — Evaluation (Signed)
Physical Therapy Evaluation Patient Details Name: [name] MRN: [id number] DOB: [birth year] Today's Date: 05/17/2019   History of Present Illness  Patient is 69 y.o. male s/p Lt TKR with I&D with polyexchange on 05/17/19. 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 He has had multiple revision arthroplasties, including resection with antibiotic spacers. Most recently he underwent TKR on 04/03/19 with 6 week antiobiotic treatment. Other significant PMH includes A-fib, hypothyroidism, GERD, headaches, CHF.    Clinical Impression  [name] is a 69 y.o. male POD 0 s/p Lt TKR with I&D and polyexchange. Patient reports modified independence with RW for mobility at baseline. Patient is now limited by functional impairments (see PT problem list below) and requires min assist for transfers and gait with RW. Patient was able to ambulate ~50 feet with RW and min guard/assist. Patient instructed in exercise to facilitate circulation . Patient will benefit from continued skilled PT interventions to address impairments and progress towards PLOF. Acute PT will follow to progress mobility and stair training in preparation for safe discharge home.     Follow Up Recommendations Follow surgeon's recommendation for DC plan and follow-up therapies    Equipment Recommendations  None recommended by PT    Recommendations for Other Services OT consult     Precautions / Restrictions Precautions Precautions: Fall;Knee Precaution Comments: wound vac Restrictions Weight Bearing Restrictions: No LLE Weight Bearing: Weight bearing as tolerated      Mobility  Bed Mobility Overal bed mobility: Needs Assistance Bed Mobility: Supine to Sit     Supine to sit: Min assist;HOB elevated     General bed mobility comments: assist for line management and to raise trunk upright. pt using bed rail and extra  time needed.  Transfers Overall transfer level: Needs assistance Equipment used: Rolling walker (2 wheeled) Transfers: Sit to/from Stand Sit to Stand: Min assist;+2 safety/equipment         General transfer comment: pt requires bed elevated slightly, 2+ min assist pt able to complete power up with bil UE use.   Ambulation/Gait Ambulation/Gait assistance: Min guard Gait Distance (Feet): 50 Feet Assistive device: Rolling walker (2 wheeled) Gait Pattern/deviations: Step-to pattern Gait velocity: decreased   General Gait Details: cues for safe proximity to RW and safe step pattern, min guard for safety.  Stairs     Wheelchair Mobility    Modified Rankin (Stroke Patients Only)       Balance Overall balance assessment: Needs assistance Sitting-balance support: Feet supported Sitting balance-Leahy Scale: Good     Standing balance support: Bilateral upper extremity supported;During functional activity Standing balance-Leahy Scale: Poor          Pertinent Vitals/Pain Pain Assessment: 0-10 Pain Score: 5  Pain Location: left knee Pain Descriptors / Indicators: Sore;Aching Pain Intervention(s): Limited activity within patient's tolerance;Monitored during session;Repositioned    Home Living Family/patient expects to be discharged to:: Private residence Living Arrangements: Alone   Type of Home: House Home Access: Ramped entrance     Home Layout: One level Home Equipment: Environmental consultant - 2 wheels;Walker - 4 wheels;Cane - single point Additional Comments: pt reports he is unable to sit fully on toliet, squats over it, and he "bird baths" at sink.     Prior Function Level of Independence: Independent with assistive device(s)         Comments: Pt using RW since LTR resection (pt admits to attempt to walk short distances without  walker)     Hand Dominance        Extremity/Trunk Assessment   Upper Extremity Assessment Upper Extremity Assessment: Overall WFL for  tasks assessed    Lower Extremity Assessment Lower Extremity Assessment: LLE deficits/detail LLE Deficits / Details: dorsiflexion 4+/5, knee extension 3/5, hip felxion 3/5 LLE Sensation: WNL LLE Coordination: WNL    Cervical / Trunk Assessment Cervical / Trunk Assessment: Other exceptions Cervical / Trunk Exceptions: large body habitus  Communication   Communication: No difficulties  Cognition Arousal/Alertness: Awake/alert Behavior During Therapy: WFL for tasks assessed/performed Overall Cognitive Status: Within Functional Limits for tasks assessed     General Comments      Exercises Total Joint Exercises Ankle Circles/Pumps: AROM;15 reps;Both   Assessment/Plan    PT Assessment Patient needs continued PT services  PT Problem List Decreased strength;Decreased range of motion;Decreased activity tolerance;Decreased mobility;Pain       PT Treatment Interventions DME instruction;Gait training;Functional mobility training;Therapeutic activities;Patient/family education;Therapeutic exercise    PT Goals (Current goals can be found in the Care Plan section)  Acute Rehab PT Goals Patient Stated Goal: to return home PT Goal Formulation: With patient Time For Goal Achievement: 05/24/19 Potential to Achieve Goals: Good    Frequency 7X/week   Barriers to discharge Decreased caregiver support         AM-PAC PT "6 Clicks" Mobility  Outcome Measure Help needed turning from your back to your side while in a flat bed without using bedrails?: A Little Help needed moving from lying on your back to sitting on the side of a flat bed without using bedrails?: A Little Help needed moving to and from a bed to a chair (including a wheelchair)?: A Little Help needed standing up from a chair using your arms (e.g., wheelchair or bedside chair)?: A Little Help needed to walk in hospital room?: A Little Help needed climbing 3-5 steps with a railing? : A Little 6 Click Score: 18    End of  Session Equipment Utilized During Treatment: Gait belt Activity Tolerance: Patient tolerated treatment well Patient left: in chair;with call bell/phone within reach;with chair alarm set Nurse Communication: Mobility status PT Visit Diagnosis: Difficulty in walking, not elsewhere classified (R26.2)    Time: [id number] PT Time Calculation (min) (ACUTE ONLY): 34 min   Charges:   PT Evaluation $PT Eval Moderate Complexity: 1 Mod PT Treatments $Gait Training: 8-22 mins        Verner Mould, DPT Physical Therapist with [facility] [phone]  05/17/2019 7:43 PM

## 2019-05-17 NOTE — Progress Notes (Signed)
Assisted Dr. Singer with left, ultrasound guided, adductor canal block. Side rails up, monitors on throughout procedure. See vital signs in flow sheet. Tolerated Procedure well.  

## 2019-05-17 NOTE — Anesthesia Procedure Notes (Signed)
Procedure Name: MAC Date/Time: 05/17/2019 12:29 PM Performed by: Deliah Boston, CRNA Pre-anesthesia Checklist: Patient identified, Emergency Drugs available, Suction available and Patient being monitored Patient Re-evaluated:Patient Re-evaluated prior to induction Oxygen Delivery Method: Simple face mask Placement Confirmation: positive ETCO2 and breath sounds checked- equal and bilateral

## 2019-05-17 NOTE — Interval H&P Note (Signed)
History and Physical Interval Note:  05/17/2019 10:31 AM  [name]  has presented today for surgery, with the diagnosis of Status post revision reimplantation left total knee with persistant wound drainage.  The various methods of treatment have been discussed with the patient and family. After consideration of risks, benefits and other options for treatment, the patient has consented to  Procedure(s) with comments: IRRIGATION AND DEBRIDEMENT KNEE WITH POLY EXCHANGE (Left) - 90 mins as a surgical intervention.  The patient's history has been reviewed, patient examined, no change in status, stable for surgery.  I have reviewed the patient's chart and labs.  Questions were answered to the patient's satisfaction.     Mauri Pole

## 2019-05-17 NOTE — Discharge Instructions (Signed)
Information on my medicine - XARELTO (Rivaroxaban)  This medication education was reviewed with me or my healthcare representative as part of my discharge preparation.  The pharmacist that spoke with me during my hospital stay was:    Why was Xarelto prescribed for you? Xarelto was prescribed for you to reduce the risk of blood clots forming after orthopedic surgery. The medical term for these abnormal blood clots is venous thromboembolism (VTE).  What do you need to know about xarelto ? Take your Xarelto ONCE DAILY at the same time every day. You may take it either with or without food.  If you have difficulty swallowing the tablet whole, you may crush it and mix in applesauce just prior to taking your dose.  Take Xarelto exactly as prescribed by your doctor and DO NOT stop taking Xarelto without talking to the doctor who prescribed the medication.  Stopping without other VTE prevention medication to take the place of Xarelto may increase your risk of developing a clot.  After discharge, you should have regular check-up appointments with your healthcare provider that is prescribing your Xarelto.    What do you do if you miss a dose? If you miss a dose, take it as soon as you remember on the same day then continue your regularly scheduled once daily regimen the next day. Do not take two doses of Xarelto on the same day.   Important Safety Information A possible side effect of Xarelto is bleeding. You should call your healthcare provider right away if you experience any of the following: ? Bleeding from an injury or your nose that does not stop. ? Unusual colored urine (red or dark brown) or unusual colored stools (red or black). ? Unusual bruising for unknown reasons. ? A serious fall or if you hit your head (even if there is no bleeding).  Some medicines may interact with Xarelto and might increase your risk of bleeding while on Xarelto. To help avoid this, consult your  healthcare provider or pharmacist prior to using any new prescription or non-prescription medications, including herbals, vitamins, non-steroidal anti-inflammatory drugs (NSAIDs) and supplements.  This website has more information on Xarelto: www.xarelto.com.   

## 2019-05-17 NOTE — Transfer of Care (Signed)
Immediate Anesthesia Transfer of Care Note  Patient: [name]  Procedure(s) Performed: Procedure(s) with comments: IRRIGATION AND DEBRIDEMENT KNEE WITH POLY EXCHANGE (Left) - 90 mins  Patient Location: PACU  Anesthesia Type:MAC, Regional and Spinal  Level of Consciousness: Patient easily awoken, sedated, comfortable, cooperative, following commands, responds to stimulation.   Airway & Oxygen Therapy: Patient spontaneously breathing, ventilating well, oxygen via simple oxygen mask.  Post-op Assessment: Report given to PACU RN, vital signs reviewed and stable .   Post vital signs: Reviewed and stable.  Complications: No apparent anesthesia complications Last Vitals:  Vitals Value Taken Time  BP 95/81 05/17/19 1500  Temp    Pulse 74 05/17/19 1503  Resp 21 05/17/19 1503  SpO2 99 % 05/17/19 1503  Vitals shown include unvalidated device data.  Last Pain:  Vitals:   05/17/19 1100  TempSrc: Oral      Patients Stated Pain Goal: 4 (88/64/84 7207)  Complications: No apparent anesthesia complications

## 2019-05-17 NOTE — Progress Notes (Signed)
Pharmacy Antibiotic Note  [name] is a 69 y.o. 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  Pharmacy has been consulted for vancomycin dosing.    [name] is known to pharmacy from previous admission.  On 2/24/20201 OPAT consult for PJI: Vanc 1250 q12 and Ceftriaxone 2 gm q24 to end 05/16/2019.  No Rifampin due to drug interaction with xarelto. 4/8 s/p repeat I&D L knee w/ poly exchange. He will get new PICC line and plans for home IV abx therapy.  Plan: Continue PTA home health doses Vancomycin 1250 mg IV q12 Ceftriaxone 2 gm IV q24 F/u w/ home health in AM ([employer] liaison Carolynn Sayers)   Height: 5\' 8"  (172.7 cm) Weight: (!) 167.5 kg (369 lb 4.8 oz) IBW/kg (Calculated) : 68.4  Temp (24hrs), Avg:97.7 F (36.5 C), Min:97.3 F (36.3 C), Max:98.6 F (37 C)  Recent Labs  Lab 05/14/19 1154  CREATININE 1.03    Estimated Creatinine Clearance: 104.9 mL/min (by C-G formula based on SCr of 1.03 mg/dL).    Allergies  Allergen Reactions  . Morphine Hives    Thank you for allowing pharmacy to be a part of this patient's care.  Eudelia Bunch, Pharm.D [phone] 05/17/2019 7:02 PM

## 2019-05-17 NOTE — Anesthesia Preprocedure Evaluation (Addendum)
Anesthesia Evaluation  Patient identified by MRN, date of birth, ID band Patient awake    Reviewed: Allergy & Precautions, NPO status , Patient's Chart, lab work & pertinent test results  History of Anesthesia Complications (+) PROLONGED EMERGENCE and history of anesthetic complications  Airway Mallampati: II  TM Distance: >3 FB     Dental no notable dental hx. (+) Dental Advisory Given   Pulmonary sleep apnea and Continuous Positive Airway Pressure Ventilation , PE   Pulmonary exam normal        Cardiovascular hypertension, Pt. on medications and Pt. on home beta blockers Normal cardiovascular exam+ dysrhythmias Atrial Fibrillation    '14 TTE - EF 50% to 55%. LA was severely dilated. RV was mildly dilated. RA was moderately dilated.   Pt cleared by cardiology 12/05/2018.  Per OV note, "Given past medical history and time since last visit, based on ACC/AHA guidelines,[name] be at acceptable risk for the planned procedure without further cardiovascular testing. Per pharmacy, patient with diagnosis ofatrial fibrillationon Xareltofor anticoagulationwith aCHADS2-VASc score of4(CHF, AGE, stroke/tia x 2/DVT/PE)andCrCl127 ml/min Per office protocol, patient can holdXareltofor 3days prior to procedureand resume when safe per surgical team. Case reviewed by Dr. Sallyanne Kuster during pre-operative evaluation."    Neuro/Psych  Headaches, negative psych ROS   GI/Hepatic Neg liver ROS, GERD  Controlled,  Endo/Other  diabetesHypothyroidism Morbid obesity  Renal/GU CRFRenal disease     Musculoskeletal  (+) Arthritis ,  Chronic back pain    Abdominal   Peds  Hematology  (+) anemia ,  Plt 274k    Anesthesia Other Findings Covid negative 11/20   Reproductive/Obstetrics                            Anesthesia Physical  Anesthesia Plan  ASA: III  Anesthesia Plan: Spinal    Post-op Pain Management:  Regional for Post-op pain   Induction:   PONV Risk Score and Plan: 1 and Treatment may vary due to age or medical condition, Propofol infusion and Ondansetron  Airway Management Planned: Natural Airway and Simple Face Mask  Additional Equipment:   Intra-op Plan:   Post-operative Plan:   Informed Consent: I have reviewed the patients History and Physical, chart, labs and discussed the procedure including the risks, benefits and alternatives for the proposed anesthesia with the patient or authorized representative who has indicated his/her understanding and acceptance.     Dental advisory given  Plan Discussed with: CRNA and Anesthesiologist  Anesthesia Plan Comments: ( )       Anesthesia Quick Evaluation

## 2019-05-18 ENCOUNTER — Encounter: Payer: Self-pay | Admitting: *Deleted

## 2019-05-18 ENCOUNTER — Inpatient Hospital Stay: Payer: Self-pay

## 2019-05-18 ENCOUNTER — Inpatient Hospital Stay (HOSPITAL_COMMUNITY): Payer: Medicare Other

## 2019-05-18 LAB — BASIC METABOLIC PANEL
Anion gap: 10 (ref 5–15)
BUN: 18 mg/dL (ref 8–23)
CO2: 22 mmol/L (ref 22–32)
Calcium: 9.6 mg/dL (ref 8.9–10.3)
Chloride: 106 mmol/L (ref 98–111)
Creatinine, Ser: 0.94 mg/dL (ref 0.61–1.24)
GFR calc Af Amer: >60 mL/min (ref >60)
GFR calc non Af Amer: >60 mL/min (ref >60)
Glucose, Bld: 145 mg/dL — ABNORMAL HIGH (ref 70–99)
Potassium: 4.4 mmol/L (ref 3.5–5.1)
Sodium: 138 mmol/L (ref 135–145)

## 2019-05-18 LAB — CBC
HCT: 30.8 % — ABNORMAL LOW (ref 39.0–52.0)
Hemoglobin: 9 g/dL — ABNORMAL LOW (ref 13.0–17.0)
MCH: 29 pg (ref 26.0–34.0)
MCHC: 29.2 g/dL — ABNORMAL LOW (ref 30.0–36.0)
MCV: 99.4 fL (ref 80.0–100.0)
Platelets: 229 10*3/uL (ref 150–400)
RBC: 3.1 MIL/uL — ABNORMAL LOW (ref 4.22–5.81)
RDW: 16.2 % — ABNORMAL HIGH (ref 11.5–15.5)
WBC: 3.7 10*3/uL — ABNORMAL LOW (ref 4.0–10.5)
nRBC: 0 % (ref 0.0–0.2)

## 2019-05-18 MED ORDER — CHLORHEXIDINE GLUCONATE CLOTH 2 % EX PADS
6.0000 | MEDICATED_PAD | Freq: Every day | CUTANEOUS | Status: DC
  Administered 2019-05-18 – 2019-05-20 (×3): 6 via TOPICAL

## 2019-05-18 MED ORDER — LIDOCAINE HCL 1 % IJ SOLN
INTRAMUSCULAR | Status: AC
  Filled 2019-05-18: qty 20

## 2019-05-18 MED ORDER — HEPARIN SOD (PORK) LOCK FLUSH 100 UNIT/ML IV SOLN
INTRAVENOUS | Status: AC
  Filled 2019-05-18: qty 5

## 2019-05-18 NOTE — Progress Notes (Addendum)
Order received for PICC by [facility].  Patient is not a candidate for bedside PICC due to inability to thread centrally (refer to [facility]).  Please refer patient to Interventional Radiology for insertion.  Last 2 PICCs that patient has had placed have been done by IR.

## 2019-05-18 NOTE — Op Note (Signed)
NAME: Garrett, [name] MEDICAL RECORD [id number] ACCOUNT [id number] DATE OF BIRTH:[birth year] FACILITY: [facility] LOCATION: [facility] PHYSICIAN:Lonney Revak D. Alane Hanssen, MD  OPERATIVE REPORT  DATE OF PROCEDURE:  05/17/2019  PREOPERATIVE DIAGNOSIS:  Status post multiple surgical procedures to salvage a left total knee replacement with persistent wound drainage despite IV antibiotics and local wound care.  POSTOPERATIVE DIAGNOSIS:  Status post multiple surgical procedures to salvage a left total knee replacement with persistent wound drainage despite IV antibiotics and local wound care.  PROCEDURES:   1.  Excision and nonexcisional debridement of left knee, including skin, subcutaneous tissue, nonviable scar tissue. 2.  Associated single component revision, replacing the polyethylene liner from a size 5 x 20 mm to a size 5.0 x 22.5 mm TC3 insert.  SURGEON:  Garrett Cancel, MD  ASSISTANT:  Griffith Citron, PA-C.  Note that Ms. [name] was present for the entirety of the case from preoperative positioning, perioperative management of the operative extremity, general facilitation of the case and primary wound closure.  ANESTHESIA:  Spinal.  SPECIMENS:  None due to the fact he had been on IV antibiotics for the last 5 weeks.  COMPLICATIONS:  None.  TOURNIQUET TIME:  Per anesthetic record at 250 mmHg.  ESTIMATED BLOOD LOSS:  Minimal.  INDICATIONS  FOR PROCEDURE:  The patient is a 69 year old male who I had known for some time.  He was initially seen and evaluated on a referral basis for an infected left total knee replacement.  He has undergone multiple surgical procedures, most  recently over the past year, trying to manage recurrent infection.  Most recently about 6 weeks ago, he underwent reimplantation of the left knee based on the tibial fracture management.  In the postoperative period, he developed some inferior wound  drainage.  Despite attempts at local treatment in the office, I felt it was  at this point in our best interest to take him to the operating room to work on this.  The drainage has always been a serous clear fluid, no purulence.  He has had no associated  constitutional symptoms.  The indications were reviewed with him in the office, the importance of trying to salvage his knee joint, critical at this point given his medical comorbidities.  DESCRIPTION OF PROCEDURE:  The patient was brought to the operative theater.  Once adequate anesthesia, preoperative antibiotics administered, he was positioned supine.  A left thigh tourniquet was placed.  The left lower extremity was prepped and draped  in sterile fashion.  Timeout was performed identifying the patient, the planned procedure and extremity.  His old incision was incised.  I did not excise any based on the tension over the proximal tibia other than proximally.  Soft tissue exposure  initially encountered in the prepatellar space, subcutaneous level, clear synovial fluid, most like a bursitis.  He did appear to have a palpable effusion.  However, when we went into this joint and made an arthrotomy, again there was still a lot of this  clear synovial fluid with a lot of fibrous tissue.  No active purulence, no foul smell.  Following initial exposure and debridement of the suprapatellar region, as well as the medial and lateral gutters, with the proximal tibial exposure, the knee was  flexed.  I removed the old polyethylene insert.  Further debridement posterior carried out.  We then irrigated the knee with nonexcisional debridement with 3 L of normal saline solution with pulse lavage.  This was followed by 400+ mL of Irrisept  chlorhexidine fluid,  which remained in the wound for 5 minutes.  Following this, we reirrigated the knee with 3 L normal saline solution.  A new polyethylene insert was opened and placed in the knee.  I did not see any evidence of any complications or  concerns within the femur or the tibia.  Once the final  component was placed in the knee, the extensor mechanism was reapproximated with a combination of #1 Vicryl and #1 Stratafix sutures.  The remainder of the wound was closed with 2-0 Vicryl.  On the  distal third of his incision, I reapproximated this with 2-0 nylon.  The remainder of the wound was closed with staples.  The knee was then cleaned, dried and dressed sterilely.  We utilized a Prevena suction dressing to help with any potential  postoperative seroma or bleeding to try to decrease wound tension for healing of the skin.  The knee was wrapped in a sterile bulky Jones dressing.  He was then brought to the recovery room in stable condition, tolerating the procedure well.  We will see  him back in the office in 2 weeks.  I am going to redo his IV PICC line and have him continue antibiotics for 6 weeks based on his history.  VN/NUANCE  D:05/18/2019 T:05/18/2019 JOB:[id number]

## 2019-05-18 NOTE — Brief Op Note (Signed)
05/17/2019  2:00pm  PATIENT:  [name]  69 y.o. male  PRE-OPERATIVE DIAGNOSIS:  Status post revision reimplantation left total knee with persistant wound drainage  POST-OPERATIVE DIAGNOSIS:  Status post revision reimplantation left total knee with persistant wound drainage  PROCEDURE:  Procedure(s) with comments: IRRIGATION AND DEBRIDEMENT KNEE WITH POLY EXCHANGE (Left) - 90 mins  SURGEON:  Surgeon(s) and Role:    Paralee Cancel, MD - Primary  PHYSICIAN ASSISTANT: Griffith Citron, PA-C  ANESTHESIA:   spinal  EBL:  25 mL   BLOOD ADMINISTERED:none  DRAINS: none   LOCAL MEDICATIONS USED:  MARCAINE     SPECIMEN:  No Specimen  DISPOSITION OF SPECIMEN:  N/A  COUNTS:  YES  TOURNIQUET: per anesthesia record at 250 mmHg  DICTATION: .Other Dictation: Dictation Number [id number]  PLAN OF CARE: Admit to inpatient   PATIENT DISPOSITION:  PACU - hemodynamically stable.   Delay start of Pharmacological VTE agent (>24hrs) due to surgical blood loss or risk of bleeding: no

## 2019-05-18 NOTE — Care Plan (Signed)
Interventional Radiology Brief Note  Existing RUE PICC in good working condition, not need for line exchange at this time.  We gave patient a card with our scheduler's number on it.  Should he experience any issues or decreased function, he should call and we'll bring him in as an out-pt for PICC exchange.   Signed,  Criselda Peaches, MD

## 2019-05-18 NOTE — Progress Notes (Signed)
Physical Therapy Treatment Patient Details Name: [name] MRN: [id number] DOB: Oct 30, 1950 Today's Date: 05/18/2019    History of Present Illness Patient is 69 y.o. male s/p Lt TKR with I&D with polyexchange on 05/17/19. 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 He has had multiple revision arthroplasties, including resection with antibiotic spacers. Most recently he underwent TKR on 04/03/19 with 6 week antiobiotic treatment. Other significant PMH includes A-fib, hypothyroidism, GERD, headaches, CHF.    PT Comments    Pt very motivated and with good pain control.  Pt progressing well with mobility and looking forward to return home  Follow Up Recommendations  Follow surgeon's recommendation for DC plan and follow-up therapies     Equipment Recommendations  None recommended by PT    Recommendations for Other Services OT consult     Precautions / Restrictions Precautions Precautions: Fall;Knee Precaution Comments: wound vac Required Braces or Orthoses: Knee Immobilizer - Left Knee Immobilizer - Left: Discontinue once straight leg raise with < 10 degree lag Restrictions Weight Bearing Restrictions: No LLE Weight Bearing: Weight bearing as tolerated    Mobility  Bed Mobility Overal bed mobility: Needs Assistance Bed Mobility: Supine to Sit     Supine to sit: HOB elevated;Min guard     General bed mobility comments: Pt up in chair and left in bathroom at end of session  Transfers Overall transfer level: Needs assistance Equipment used: Rolling walker (2 wheeled) Transfers: Sit to/from Stand Sit to Stand: Min guard         General transfer comment: min cues for use of UEs to self assist  Ambulation/Gait Ambulation/Gait assistance: Min guard;Supervision Gait Distance (Feet): 400 Feet Assistive device: Rolling walker (2 wheeled) Gait Pattern/deviations: Step-to  pattern;Step-through pattern;Shuffle;Trunk flexed Gait velocity: decreased   General Gait Details: cues for safe proximity to RW and safe step pattern, min guard for safety.   Stairs             Wheelchair Mobility    Modified Rankin (Stroke Patients Only)       Balance Overall balance assessment: Needs assistance Sitting-balance support: Feet supported Sitting balance-Leahy Scale: Good     Standing balance support: Bilateral upper extremity supported;During functional activity Standing balance-Leahy Scale: Fair                              Cognition Arousal/Alertness: Awake/alert Behavior During Therapy: WFL for tasks assessed/performed Overall Cognitive Status: Within Functional Limits for tasks assessed                                        Exercises Total Joint Exercises Ankle Circles/Pumps: AROM;15 reps;Both Quad Sets: AROM;Both;Supine;15 reps Heel Slides: AAROM;AROM;Left;Supine;15 reps Straight Leg Raises: Left;AAROM;Supine;20 reps Goniometric ROM: AAROM L knee 0 - 70    General Comments        Pertinent Vitals/Pain Pain Assessment: 0-10 Pain Score: 4  Pain Location: left knee Pain Descriptors / Indicators: Sore;Aching Pain Intervention(s): Limited activity within patient's tolerance;Monitored during session;Premedicated before session;Ice applied    Home Living                      Prior Function            PT Goals (current goals can now be  found in the care plan section) Acute Rehab PT Goals Patient Stated Goal: to return home PT Goal Formulation: With patient Time For Goal Achievement: 05/24/19 Potential to Achieve Goals: Good Progress towards PT goals: Progressing toward goals    Frequency    7X/week      PT Plan Current plan remains appropriate    Co-evaluation              AM-PAC PT "6 Clicks" Mobility   Outcome Measure  Help needed turning from your back to your side while  in a flat bed without using bedrails?: A Little Help needed moving from lying on your back to sitting on the side of a flat bed without using bedrails?: A Little Help needed moving to and from a bed to a chair (including a wheelchair)?: A Little Help needed standing up from a chair using your arms (e.g., wheelchair or bedside chair)?: A Little Help needed to walk in hospital room?: A Little Help needed climbing 3-5 steps with a railing? : A Little 6 Click Score: 18    End of Session Equipment Utilized During Treatment: Gait belt Activity Tolerance: Patient tolerated treatment well Patient left: Other (comment)(bathroom) Nurse Communication: Mobility status PT Visit Diagnosis: Difficulty in walking, not elsewhere classified (R26.2)     Time: [id number] PT Time Calculation (min) (ACUTE ONLY): 32 min  Charges:  $Gait Training: 8-22 mins $Therapeutic Exercise: 8-22 mins                     Arthur Pager [phone] Office [phone]    S[place] Pete 05/18/2019, 3:17 PM

## 2019-05-18 NOTE — Anesthesia Postprocedure Evaluation (Signed)
Anesthesia Post Note  Patient: [name]  Procedure(s) Performed: IRRIGATION AND DEBRIDEMENT KNEE WITH POLY EXCHANGE (Left Knee)     Patient location during evaluation: PACU Anesthesia Type: Spinal Level of consciousness: awake and alert Pain management: pain level controlled Vital Signs Assessment: post-procedure vital signs reviewed and stable Respiratory status: spontaneous breathing and respiratory function stable Cardiovascular status: blood pressure returned to baseline and stable Postop Assessment: spinal receding Anesthetic complications: no                  Natelie Ostrosky DANIEL

## 2019-05-18 NOTE — Plan of Care (Signed)
Care plan initiated.

## 2019-05-18 NOTE — Progress Notes (Signed)
Physical Therapy Treatment Patient Details Name: [name] MRN: [id number] DOB: [birth year] Today's Date: 05/18/2019    History of Present Illness Patient is 69 y.o. male s/p Lt TKR with I&D with polyexchange on 05/17/19. 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 He has had multiple revision arthroplasties, including resection with antibiotic spacers. Most recently he underwent TKR on 04/03/19 with 6 week antiobiotic treatment. Other significant PMH includes A-fib, hypothyroidism, GERD, headaches, CHF.    PT Comments    Pt motivated and progressing well with mobility.  Pt reports difficulty with incision healing is reason for his return and most recent surgery.  Wound vac is in place and have requested clarification on limitations on knee flexion.   Follow Up Recommendations  Follow surgeon's recommendation for DC plan and follow-up therapies     Equipment Recommendations  None recommended by PT    Recommendations for Other Services OT consult     Precautions / Restrictions Precautions Precautions: Fall;Knee Precaution Comments: wound vac Required Braces or Orthoses: Knee Immobilizer - Left Knee Immobilizer - Left: Discontinue once straight leg raise with < 10 degree lag Restrictions Weight Bearing Restrictions: No LLE Weight Bearing: Weight bearing as tolerated    Mobility  Bed Mobility Overal bed mobility: Needs Assistance Bed Mobility: Supine to Sit     Supine to sit: HOB elevated;Min guard     General bed mobility comments: use of bedrail and assist with line management  Transfers Overall transfer level: Needs assistance Equipment used: Rolling walker (2 wheeled) Transfers: Sit to/from Stand Sit to Stand: Min guard         General transfer comment: min cues for use of UEs to self assist  Ambulation/Gait Ambulation/Gait assistance: Min guard Gait  Distance (Feet): 200 Feet Assistive device: Rolling walker (2 wheeled) Gait Pattern/deviations: Step-to pattern;Step-through pattern;Shuffle;Trunk flexed Gait velocity: decreased   General Gait Details: cues for safe proximity to RW and safe step pattern, min guard for safety.   Stairs             Wheelchair Mobility    Modified Rankin (Stroke Patients Only)       Balance Overall balance assessment: Needs assistance Sitting-balance support: Feet supported Sitting balance-Leahy Scale: Good     Standing balance support: Bilateral upper extremity supported;During functional activity Standing balance-Leahy Scale: Poor                              Cognition Arousal/Alertness: Awake/alert Behavior During Therapy: WFL for tasks assessed/performed Overall Cognitive Status: Within Functional Limits for tasks assessed                                        Exercises Total Joint Exercises Ankle Circles/Pumps: AROM;15 reps;Both Quad Sets: AROM;Both;Supine;10 reps    General Comments        Pertinent Vitals/Pain Pain Assessment: 0-10 Pain Score: 5  Pain Location: left knee Pain Descriptors / Indicators: Sore;Aching Pain Intervention(s): Limited activity within patient's tolerance;Monitored during session;Premedicated before session;Ice applied    Home Living                      Prior Function            PT Goals (current goals can now be found in  the care plan section) Acute Rehab PT Goals Patient Stated Goal: to return home PT Goal Formulation: With patient Time For Goal Achievement: 05/24/19 Potential to Achieve Goals: Good Progress towards PT goals: Progressing toward goals    Frequency    7X/week      PT Plan Current plan remains appropriate    Co-evaluation              AM-PAC PT "6 Clicks" Mobility   Outcome Measure  Help needed turning from your back to your side while in a flat bed without  using bedrails?: A Little Help needed moving from lying on your back to sitting on the side of a flat bed without using bedrails?: A Little Help needed moving to and from a bed to a chair (including a wheelchair)?: A Little Help needed standing up from a chair using your arms (e.g., wheelchair or bedside chair)?: A Little Help needed to walk in hospital room?: A Little Help needed climbing 3-5 steps with a railing? : A Little 6 Click Score: 18    End of Session Equipment Utilized During Treatment: Gait belt Activity Tolerance: Patient tolerated treatment well Patient left: in chair;with call bell/phone within reach;with chair alarm set Nurse Communication: Mobility status PT Visit Diagnosis: Difficulty in walking, not elsewhere classified (R26.2)     Time: [id number] PT Time Calculation (min) (ACUTE ONLY): 28 min  Charges:  $Gait Training: 8-22 mins $Therapeutic Exercise: 8-22 mins                     Debe Coder PT Acute Rehabilitation Services Pager [phone] Office [phone]    Wilson Sample 05/18/2019, 12:59 PM

## 2019-05-18 NOTE — TOC Initial Note (Signed)
Transition of Care [facility]) - Initial/Assessment Note    Patient Details  Name: [name] MRN: [id number] Date of Birth: [birth year]  Transition of Care [facility]) CM/SW Contact:    Lia Hopping, [place] Phone Number: 05/18/2019, 11:24 AM  Clinical Narrative:  Patient admitted for left total knee irrigation and debridement with possible polyethylene exchange.                  Patient will continue IV infusion at home. Carolynn Sayers actively following and assisting . CSW confirm [facility] will continue RN/PT services.  No other needs identified. CSW will sign off.   Expected Discharge Plan: [place] Barriers to Discharge: Continued Medical Work up   Patient Goals and CMS Choice        Expected Discharge Plan and Services Expected Discharge Plan: [place]                           DME Agency: [employer]       HH Arranged: PT, RN, IV Antibiotics HH Agency: [facility] [place] Date [place]: 05/18/19 Time [place]: 1007 Representative spoke with at [place]: Ted Mcalpine  Prior Living Arrangements/Services     Patient language and need for interpreter reviewed:: No Do you feel safe going back to the place where you live?: Yes      Need for Family Participation in Patient Care: Yes (Comment) Care giver support system in place?: Yes (comment)   Criminal Activity/Legal Involvement Pertinent to Current Situation/Hospitalization: No - Comment as needed  Activities of Daily Living Home Assistive Devices/Equipment: CPAP, Walker (specify type), Eyeglasses ADL Screening (condition at time of admission) Patient's cognitive ability adequate to safely complete daily activities?: Yes Is the patient deaf or have difficulty hearing?: No Does the patient have difficulty seeing, even when wearing glasses/contacts?: No Does the patient have difficulty concentrating, remembering, or making  decisions?: No Patient able to express need for assistance with ADLs?: Yes Does the patient have difficulty dressing or bathing?: No Independently performs ADLs?: Yes (appropriate for developmental age) Does the patient have difficulty walking or climbing stairs?: Yes Weakness of Legs: Left Weakness of Arms/Hands: None  Permission Sought/Granted Permission sought to share information with : Case Manager Permission granted to share information with : Yes, Verbal Permission Granted     Permission granted to share info w AGENCY: [facility]        Emotional Assessment Appearance:: Appears stated age Attitude/Demeanor/Rapport: Engaged Affect (typically observed): Accepting Orientation: : Oriented to Self, Oriented to Place, Oriented to  Time, Oriented to Situation Alcohol / Substance Use: Not Applicable Psych Involvement: No (comment)  Admission diagnosis:  S/P revision of total knee, left [Z96.652] Patient Active Problem List   Diagnosis Date Noted  . S/P left TK revision 04/03/2019  . Administration of long-term prophylactic antibiotics   . History of streptococcal infection   . History of DVT (deep vein thrombosis)   . S/P rev left TK 12/15/2015  . 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 Preoperative cardiovascular examination 04/17/2013  . OSA on CPAP 11/27/2012  . Chronic diastolic heart failure ([place]) 09/10/2012  . Chronic anticoagulation, with Xarelto 09/10/2012  . DVT (deep venous thrombosis), possible 09/10/2012  . SOB (shortness of breath) 08/26/2012  . Chest discomfort 08/26/2012  . Persistent atrial fibrillation ([place]) 08/26/2012  . Super obesity 06/27/2012  . Expected blood loss anemia 06/27/2012  .  S/P left TK revision 06/23/2012  . Septic arthritis of knee ([place])   . Cellulitis 09/19/2010  . METHICILLIN RESISTANT STAPHYLOCOCCUS AUREUS INFECTION 09/10/2009  . 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 DM 07/04/2008  . CHRONIC KIDNEY DISEASE  UNSPECIFIED 07/04/2008  . PULMONARY EMBOLISM, HX OF 07/04/2008  . INFECTION DUE TO INTERNAL ORTH DEVICE NEC 03/02/2006   PCP:  Bernerd Limbo, MD Pharmacy:   [facility], [address] [place] [address] Phone: [phone] Fax: [phone]     Social Determinants of Health (SDOH) Interventions    Readmission Risk Interventions No flowsheet data found.

## 2019-05-18 NOTE — Progress Notes (Signed)
OT Cancellation Note  Patient Details Name: [name] MRN: [id number] DOB: 1950/12/20   Cancelled Treatment:    Reason Eval/Treat Not Completed: OT screened, no needs identified, will sign off--pt with hx of multiple knee surgeries and reports no need for OT services, no concern for ADL engagement.  Please re-consult if needed.   Jolaine Artist, OT Acute Rehabilitation Services Pager [phone] Office [phone]    Delight Stare 05/18/2019, 9:27 AM

## 2019-05-18 NOTE — Progress Notes (Signed)
Rehab Admissions Coordinator Note:  Per PT request, this patient was screened by Raechel Ache for appropriateness for an Inpatient Acute Rehab Consult. With pt's current working diagnosis, he does not appear to have the medical necessity to warrant an IP Rehab stay. He is already Min G for ambulation on POD 0. Anticipate he will progress quickly and will no longer require intensive inpatient rehab services. AC will not pursue CIR consult for this patient.   Raechel Ache 05/18/2019, 7:25 AM  I can be reached at [phone].

## 2019-05-18 NOTE — Progress Notes (Signed)
     Subjective: 1 Day Post-Op Procedure(s) (LRB): IRRIGATION AND DEBRIDEMENT KNEE WITH POLY EXCHANGE (Left)   Patient reports pain as mild, pain controlled with medication.  No reported events overnight.  Dr. Alvan Dame discussed the procedure, findings and expectations moving forward.  Dr. Alvan Dame described the [facility] dressing and discussed how he will be able to go home with this dressing.  Patient will continue with home health for antibiotics.  Order placed for IR to place a new PICC line for the home antibiotics.     Objective:   VITALS:   Vitals:   05/18/19 0156 05/18/19 0623  BP: 116/77 132/84  Pulse: 73 88  Resp: 16 16  Temp: 97.7 F (36.5 C) 97.7 F (36.5 C)  SpO2: 98% 98%    Dorsiflexion/Plantar flexion intact Incision: dressing C/D/I No cellulitis present Compartment soft  LABS Recent Labs    05/18/19 0329  HGB 9.0*  HCT 30.8*  WBC 3.7*  PLT 229    Recent Labs    05/18/19 0329  NA 138  K 4.4  BUN 18  CREATININE 0.94  GLUCOSE 145*     Assessment/Plan: 1 Day Post-Op Procedure(s) (LRB): IRRIGATION AND DEBRIDEMENT KNEE WITH POLY EXCHANGE (Left) Order placed for IR PICC placement Home health orders for antibiotics OPA T order placed for home antibiotic Foley cath d/c'ed Advance diet Up with therapy D/C IV fluids Discharge home, probably Sat or Sun  Morbid Obesity (BMI >40)  Estimated body mass index is 56.15 kg/m as calculated from the following:   Height as of this encounter: 5\' 8"  (1.727 m).   Weight as of this encounter: 167.5 kg. Patient also counseled that weight may inhibit the healing process Patient counseled that losing weight will help with future health issues             Danae Orleans PA-C  [facility]  [place] [address], [place], [place] [address] Phone: [phone] [email or web address] Facebook  [employer]

## 2019-05-19 LAB — CBC
HCT: 31.1 % — ABNORMAL LOW (ref 39.0–52.0)
Hemoglobin: 9.1 g/dL — ABNORMAL LOW (ref 13.0–17.0)
MCH: 29 pg (ref 26.0–34.0)
MCHC: 29.3 g/dL — ABNORMAL LOW (ref 30.0–36.0)
MCV: 99 fL (ref 80.0–100.0)
Platelets: 278 10*3/uL (ref 150–400)
RBC: 3.14 MIL/uL — ABNORMAL LOW (ref 4.22–5.81)
RDW: 16.6 % — ABNORMAL HIGH (ref 11.5–15.5)
WBC: 8.3 10*3/uL (ref 4.0–10.5)
nRBC: 0 % (ref 0.0–0.2)

## 2019-05-19 LAB — BASIC METABOLIC PANEL
Anion gap: 9 (ref 5–15)
BUN: 21 mg/dL (ref 8–23)
CO2: 23 mmol/L (ref 22–32)
Calcium: 10 mg/dL (ref 8.9–10.3)
Chloride: 106 mmol/L (ref 98–111)
Creatinine, Ser: 0.92 mg/dL (ref 0.61–1.24)
GFR calc Af Amer: >60 mL/min (ref >60)
GFR calc non Af Amer: >60 mL/min (ref >60)
Glucose, Bld: 132 mg/dL — ABNORMAL HIGH (ref 70–99)
Potassium: 4.5 mmol/L (ref 3.5–5.1)
Sodium: 138 mmol/L (ref 135–145)

## 2019-05-19 LAB — VANCOMYCIN, TROUGH: Vancomycin Tr: 21 ug/mL (ref 15–20)

## 2019-05-19 MED ORDER — VANCOMYCIN HCL IN DEXTROSE 1-5 GM/200ML-% IV SOLN
1000.0000 mg | Freq: Two times a day (BID) | INTRAVENOUS | Status: DC
  Administered 2019-05-19 – 2019-05-21 (×5): 1000 mg via INTRAVENOUS
  Filled 2019-05-19 (×6): qty 200

## 2019-05-19 NOTE — Progress Notes (Signed)
Physical Therapy Treatment Patient Details Name: [name] MRN: [id number] DOB: 10-26-1950 Today's Date: 05/19/2019    History of Present Illness Patient is 69 y.o. male s/p Lt TKR with I&D with polyexchange on 05/17/19. 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 He has had multiple revision arthroplasties, including resection with antibiotic spacers. Most recently he underwent TKR on 04/03/19 with 6 week antiobiotic treatment. Other significant PMH includes A-fib, hypothyroidism, GERD, headaches, CHF.    PT Comments    Pt mobilizing well and feeling increased confidence - pt states "this is the best this knee has felt in six years".   Follow Up Recommendations  Follow surgeon's recommendation for DC plan and follow-up therapies     Equipment Recommendations  None recommended by PT    Recommendations for Other Services OT consult     Precautions / Restrictions Precautions Precautions: Fall;Knee Precaution Comments: wound vac Required Braces or Orthoses: Knee Immobilizer - Left Knee Immobilizer - Left: Discontinue once straight leg raise with < 10 degree lag Restrictions Weight Bearing Restrictions: No LLE Weight Bearing: Weight bearing as tolerated    Mobility  Bed Mobility               General bed mobility comments: Pt up in chair and requests back to same  Transfers Overall transfer level: Needs assistance Equipment used: Rolling walker (2 wheeled) Transfers: Sit to/from Stand Sit to Stand: Min guard         General transfer comment: min cues for use of UEs to self assist  Ambulation/Gait Ambulation/Gait assistance: Min guard;Supervision Gait Distance (Feet): 400 Feet Assistive device: Rolling walker (2 wheeled) Gait Pattern/deviations: Step-to pattern;Step-through pattern;Shuffle;Trunk flexed Gait velocity: decreased   General Gait Details: cues  for posture and position from [employer]             Wheelchair Mobility    Modified Rankin (Stroke Patients Only)       Balance Overall balance assessment: Needs assistance Sitting-balance support: Feet supported Sitting balance-Leahy Scale: Good     Standing balance support: Bilateral upper extremity supported;During functional activity Standing balance-Leahy Scale: Fair                              Cognition Arousal/Alertness: Awake/alert Behavior During Therapy: WFL for tasks assessed/performed Overall Cognitive Status: Within Functional Limits for tasks assessed                                        Exercises      General Comments        Pertinent Vitals/Pain Pain Assessment: 0-10 Pain Score: 3  Pain Location: left knee Pain Descriptors / Indicators: Sore;Aching Pain Intervention(s): Limited activity within patient's tolerance;Monitored during session;Premedicated before session;Ice applied    Home Living                      Prior Function            PT Goals (current goals can now be found in the care plan section) Acute Rehab PT Goals Patient Stated Goal: to return home PT Goal Formulation: With patient Time For Goal Achievement: 05/24/19 Potential to Achieve Goals: Good Progress towards PT goals: Progressing toward goals    Frequency  7X/week      PT Plan Current plan remains appropriate    Co-evaluation              AM-PAC PT "6 Clicks" Mobility   Outcome Measure  Help needed turning from your back to your side while in a flat bed without using bedrails?: A Little Help needed moving from lying on your back to sitting on the side of a flat bed without using bedrails?: A Little Help needed moving to and from a bed to a chair (including a wheelchair)?: A Little Help needed standing up from a chair using your arms (e.g., wheelchair or bedside chair)?: A Little Help needed to walk in  hospital room?: A Little Help needed climbing 3-5 steps with a railing? : A Little 6 Click Score: 18    End of Session Equipment Utilized During Treatment: Gait belt Activity Tolerance: Patient tolerated treatment well Patient left: in chair;with call bell/phone within reach Nurse Communication: Mobility status PT Visit Diagnosis: Difficulty in walking, not elsewhere classified (R26.2)     Time: [id number] PT Time Calculation (min) (ACUTE ONLY): 18 min  Charges:  $Gait Training: 8-22 mins                     [place] Pager [phone] Office [phone]    Ebonye Reade 05/19/2019, 4:42 PM

## 2019-05-19 NOTE — Progress Notes (Signed)
Subjective: 2 Days Post-Op Procedure(s) (LRB): IRRIGATION AND DEBRIDEMENT KNEE WITH POLY EXCHANGE (Left) Patient reports pain as minimal.   He states he has had a lot of drainage in the VAC  Objective: Vital signs in last 24 hours: Temp:  [97.5 F (36.4 C)-97.8 F (36.6 C)] 97.8 F (36.6 C) (04/10 0539) Pulse Rate:  [80-91] 82 (04/10 0539) Resp:  [15-20] 15 (04/10 0539) BP: (108-128)/(70-94) 128/92 (04/10 0539) SpO2:  [94 %-96 %] 94 % (04/10 0539)  Intake/Output from previous day: 04/09 0701 - 04/10 0700 In: 2108.7 [P.O.:1080; I.V.:403.2; IV Piggyback:625.5] Out: [id number] [Urine:1125; Drains:120] Intake/Output this shift: No intake/output data recorded.  Recent Labs    05/18/19 0329 05/19/19 0558  HGB 9.0* 9.1*   Recent Labs    05/18/19 0329 05/19/19 0558  WBC 3.7* 8.3  RBC 3.10* 3.14*  HCT 30.8* 31.1*  PLT 229 278   Recent Labs    05/18/19 0329 05/19/19 0558  NA 138 138  K 4.4 4.5  CL 106 106  CO2 22 23  BUN 18 21  CREATININE 0.94 0.92  GLUCOSE 145* 132*  CALCIUM 9.6 10.0   No results for input(s): LABPT, INR in the last 72 hours.  No cellulitis present Compartment soft VAC functioning well   Assessment/Plan: 2 Days Post-Op Procedure(s) (LRB): IRRIGATION AND DEBRIDEMENT KNEE WITH POLY EXCHANGE (Left) Plan for discharge tomorrow  Change to the portable pump tomorrow     Gaynelle Arabian 05/19/2019, 7:36 AM

## 2019-05-19 NOTE — Progress Notes (Signed)
Pharmacy Antibiotic Note  [name] is a 69 y.o. 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  Pharmacy has been consulted for vancomycin dosing.    [name] is known to pharmacy from previous admission.  On 2/24/20201 OPAT consult for PJI: Vanc 1250 q12 and Ceftriaxone 2 gm q24 to end 05/16/2019.  No Rifampin due to drug interaction with xarelto. 4/8 s/p repeat I&D L knee w/ poly exchange. He will get new PICC line and plans for home IV abx therapy thru 06/29/19.  05/19/2019: -Afebrile with WBC WNL -Scr 0.92, stable -Vancomycin trough 21 on 1250mg  IV q12h (goal 15-20)  Plan: Decrease Vancomycin 1000 mg IV q12 Ceftriaxone 2 gm IV q24 Noted plans for discharge tomorrow- OPAT completed  Height: 5\' 8"  (172.7 cm) Weight: (!) 167.5 kg (369 lb 4.8 oz) IBW/kg (Calculated) : 68.4  Temp (24hrs), Avg:97.6 F (36.4 C), Min:97.5 F (36.4 C), Max:97.8 F (36.6 C)  Recent Labs  Lab 05/14/19 1154 05/18/19 0329 05/19/19 0558 05/19/19 0948  WBC  --  3.7* 8.3  --   CREATININE 1.03 0.94 0.92  --   VANCOTROUGH  --   --   --  21*    Estimated Creatinine Clearance: 117.4 mL/min (by C-G formula based on SCr of 0.92 mg/dL).    Allergies  Allergen Reactions  . Morphine Hives    Thank you for allowing pharmacy to be a part of this patient's care.  Netta Cedars, PharmD, BCPS 05/19/2019 10:44 AM

## 2019-05-19 NOTE — Plan of Care (Signed)
  Problem: Pain Managment: °Goal: General experience of comfort will improve °Outcome: Progressing °  °Problem: Education: °Goal: Knowledge of the prescribed therapeutic regimen will improve °Outcome: Progressing °  °

## 2019-05-19 NOTE — Progress Notes (Signed)
CRITICAL VALUE ALERT  Critical Value:  Vanc Trough, 21  Date & Time Notied:  05/19/19; 1027  Provider Notified: notified Pharmacist, Christine    Orders Received/Actions taken: Pharmacy to adjust. Hold 10am dose   Christine to notify clinical pharmacist to adjust dose.

## 2019-05-19 NOTE — Progress Notes (Signed)
Physical Therapy Treatment Patient Details Name: [name] MRN: [id number] DOB: [birth year] Today's Date: 05/19/2019    History of Present Illness Patient is 69 y.o. male s/p Lt TKR with I&D with polyexchange on 05/17/19. 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 He has had multiple revision arthroplasties, including resection with antibiotic spacers. Most recently he underwent TKR on 04/03/19 with 6 week antiobiotic treatment. Other significant PMH includes A-fib, hypothyroidism, GERD, headaches, CHF.    PT Comments    Pt continues to progress well with mobility and hopeful for dc home tomorrow.   Follow Up Recommendations  Follow surgeon's recommendation for DC plan and follow-up therapies     Equipment Recommendations  None recommended by PT    Recommendations for Other Services OT consult     Precautions / Restrictions Precautions Precautions: Fall;Knee Precaution Comments: wound vac Required Braces or Orthoses: Knee Immobilizer - Left Knee Immobilizer - Left: Discontinue once straight leg raise with < 10 degree lag Restrictions Weight Bearing Restrictions: No LLE Weight Bearing: Weight bearing as tolerated    Mobility  Bed Mobility               General bed mobility comments: Pt up in chair and requests back to same  Transfers Overall transfer level: Needs assistance Equipment used: Rolling walker (2 wheeled) Transfers: Sit to/from Stand Sit to Stand: Min guard         General transfer comment: min cues for use of UEs to self assist  Ambulation/Gait Ambulation/Gait assistance: Min guard;Supervision Gait Distance (Feet): 400 Feet Assistive device: Rolling walker (2 wheeled) Gait Pattern/deviations: Step-to pattern;Step-through pattern;Shuffle;Trunk flexed Gait velocity: decreased   General Gait Details: cues for safe proximity to RW and safe step  pattern, min guard for safety.   Stairs             Wheelchair Mobility    Modified Rankin (Stroke Patients Only)       Balance Overall balance assessment: Needs assistance Sitting-balance support: Feet supported Sitting balance-Leahy Scale: Good     Standing balance support: Bilateral upper extremity supported;During functional activity Standing balance-Leahy Scale: Fair                              Cognition Arousal/Alertness: Awake/alert Behavior During Therapy: WFL for tasks assessed/performed Overall Cognitive Status: Within Functional Limits for tasks assessed                                        Exercises Total Joint Exercises Ankle Circles/Pumps: AROM;15 reps;Both Quad Sets: AROM;Both;Supine;15 reps Heel Slides: AAROM;AROM;Left;Supine;15 reps Straight Leg Raises: Left;Supine;20 reps;AAROM;AROM Long Arc Quad: AAROM;Left;Seated;10 reps    General Comments        Pertinent Vitals/Pain Pain Assessment: 0-10 Pain Score: 4  Pain Location: left knee Pain Descriptors / Indicators: Sore;Aching Pain Intervention(s): Limited activity within patient's tolerance;Monitored during session;Premedicated before session;Ice applied    Home Living                      Prior Function            PT Goals (current goals can now be found in the care plan section) Acute Rehab PT Goals Patient Stated Goal: to return home PT Goal Formulation: With patient Time  For Goal Achievement: 05/24/19 Potential to Achieve Goals: Good Progress towards PT goals: Progressing toward goals    Frequency    7X/week      PT Plan Current plan remains appropriate    Co-evaluation              AM-PAC PT "6 Clicks" Mobility   Outcome Measure  Help needed turning from your back to your side while in a flat bed without using bedrails?: A Little Help needed moving from lying on your back to sitting on the side of a flat bed without  using bedrails?: A Little Help needed moving to and from a bed to a chair (including a wheelchair)?: A Little Help needed standing up from a chair using your arms (e.g., wheelchair or bedside chair)?: A Little Help needed to walk in hospital room?: A Little Help needed climbing 3-5 steps with a railing? : A Little 6 Click Score: 18    End of Session Equipment Utilized During Treatment: Gait belt Activity Tolerance: Patient tolerated treatment well Patient left: in chair;with call bell/phone within reach Nurse Communication: Mobility status PT Visit Diagnosis: Difficulty in walking, not elsewhere classified (R26.2)     Time: [id number] PT Time Calculation (min) (ACUTE ONLY): 27 min  Charges:  $Gait Training: 8-22 mins $Therapeutic Exercise: 8-22 mins                     Debe Coder PT Acute Rehabilitation Services Pager [phone] Office [phone]    Trequan Marsolek 05/19/2019, 12:49 PM

## 2019-05-20 MED ORDER — CEFTRIAXONE IV (FOR PTA / DISCHARGE USE ONLY): 2.0000 g | INTRAVENOUS | 0 refills | Status: AC

## 2019-05-20 MED ORDER — SODIUM CHLORIDE 0.9% FLUSH
10.0000 mL | INTRAVENOUS | Status: DC | PRN
  Administered 2019-05-20 – 2019-05-21 (×2): 10 mL

## 2019-05-20 MED ORDER — VANCOMYCIN IV (FOR PTA / DISCHARGE USE ONLY): 1000.0000 mg | Freq: Two times a day (BID) | INTRAVENOUS | 0 refills | Status: AC

## 2019-05-20 MED ORDER — OXYCODONE HCL 5 MG PO TABS: 5.0000 mg | ORAL_TABLET | ORAL | 0 refills | Status: DC | PRN

## 2019-05-20 MED ORDER — HEPARIN SOD (PORK) LOCK FLUSH 100 UNIT/ML IV SOLN
250.0000 [IU] | INTRAVENOUS | Status: DC | PRN
  Filled 2019-05-20: qty 2.5

## 2019-05-20 MED ORDER — SODIUM CHLORIDE 0.9% FLUSH
10.0000 mL | Freq: Two times a day (BID) | INTRAVENOUS | Status: DC
  Administered 2019-05-20: 10 mL

## 2019-05-20 NOTE — Progress Notes (Signed)
Pt stable at time of d/c instructions and education. Pt to d/c with picc line in place. Iv team came to flush and perform picc care prior to d/c. Pt placed on portable wound vac. No needs at this time. Pt awaiting ride.

## 2019-05-20 NOTE — Progress Notes (Signed)
Dierdre Highman notified of portable wound vac filling up quickly. It was decided that pt would stay and be monitored. Rn will hook pt back up to bigger wound vac. Pt overall otherwise stable and understanding of the situation.

## 2019-05-20 NOTE — Plan of Care (Signed)
  Problem: Activity: Goal: Range of joint motion will improve Outcome: Progressing   Problem: Pain Managment: Goal: General experience of comfort will improve Outcome: Progressing   Problem: Skin Integrity: Goal: Risk for impaired skin integrity will decrease Outcome: Progressing   Problem: Elimination: Goal: Will not experience complications related to bowel motility Outcome: Progressing

## 2019-05-20 NOTE — Progress Notes (Signed)
Physical Therapy Treatment Patient Details Name: [name] MRN: [id number] DOB: [birth year] Today's Date: 05/20/2019    History of Present Illness Patient is 69 y.o. male s/p Lt TKR with I&D with polyexchange on 05/17/19. 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 He has had multiple revision arthroplasties, including resection with antibiotic spacers. Most recently he underwent TKR on 04/03/19 with 6 week antiobiotic treatment. Other significant PMH includes A-fib, hypothyroidism, GERD, headaches, CHF.    PT Comments    Pt mobilizing well and eager for dc home.   Follow Up Recommendations  Follow surgeon's recommendation for DC plan and follow-up therapies     Equipment Recommendations  None recommended by PT    Recommendations for Other Services OT consult     Precautions / Restrictions Precautions Precautions: Fall;Knee Precaution Comments: wound vac Required Braces or Orthoses: Knee Immobilizer - Left Knee Immobilizer - Left: Discontinue once straight leg raise with < 10 degree lag Restrictions Weight Bearing Restrictions: No LLE Weight Bearing: Weight bearing as tolerated    Mobility  Bed Mobility               General bed mobility comments: Pt up in chair and requests back to same  Transfers Overall transfer level: Modified independent Equipment used: Rolling walker (2 wheeled) Transfers: Sit to/from Stand Sit to Stand: Modified independent (Device/Increase time)         General transfer comment: Pt self-cues  Ambulation/Gait Ambulation/Gait assistance: Supervision;Modified independent (Device/Increase time) Gait Distance (Feet): 400 Feet Assistive device: Rolling walker (2 wheeled) Gait Pattern/deviations: Step-to pattern;Step-through pattern;Shuffle;Trunk flexed Gait velocity: decreased   General Gait Details: min cues for posture and position  from [employer]             Wheelchair Mobility    Modified Rankin (Stroke Patients Only)       Balance Overall balance assessment: Needs assistance Sitting-balance support: Feet supported Sitting balance-Leahy Scale: Good     Standing balance support: Bilateral upper extremity supported;During functional activity Standing balance-Leahy Scale: Fair                              Cognition Arousal/Alertness: Awake/alert Behavior During Therapy: WFL for tasks assessed/performed Overall Cognitive Status: Within Functional Limits for tasks assessed                                        Exercises Total Joint Exercises Ankle Circles/Pumps: AROM;15 reps;Both Quad Sets: AROM;Both;Supine;15 reps Heel Slides: AAROM;AROM;Left;Supine;20 reps Straight Leg Raises: Left;Supine;20 reps;AAROM;AROM Long Arc Quad: Left;Seated;AAROM;AROM;20 reps    General Comments        Pertinent Vitals/Pain Pain Assessment: 0-10 Pain Score: 3  Pain Location: left knee Pain Descriptors / Indicators: Sore;Aching Pain Intervention(s): Limited activity within patient's tolerance;Monitored during session;Premedicated before session;Ice applied    Home Living                      Prior Function            PT Goals (current goals can now be found in the care plan section) Acute Rehab PT Goals Patient Stated Goal: to return home PT Goal Formulation: With patient Time For Goal Achievement: 05/24/19 Potential to Achieve Goals: Good Progress towards PT goals: Progressing  toward goals    Frequency    7X/week      PT Plan Current plan remains appropriate    Co-evaluation              AM-PAC PT "6 Clicks" Mobility   Outcome Measure  Help needed turning from your back to your side while in a flat bed without using bedrails?: A Little Help needed moving from lying on your back to sitting on the side of a flat bed without using bedrails?: A  Little Help needed moving to and from a bed to a chair (including a wheelchair)?: A Little Help needed standing up from a chair using your arms (e.g., wheelchair or bedside chair)?: None Help needed to walk in hospital room?: None Help needed climbing 3-5 steps with a railing? : A Little 6 Click Score: 20    End of Session Equipment Utilized During Treatment: Gait belt Activity Tolerance: Patient tolerated treatment well Patient left: in chair;with call bell/phone within reach Nurse Communication: Mobility status PT Visit Diagnosis: Difficulty in walking, not elsewhere classified (R26.2)     Time: [id number] PT Time Calculation (min) (ACUTE ONLY): 13 min  Charges:  $Gait Training: 8-22 mins $Therapeutic Exercise: 8-22 mins                     Debe Coder PT Acute Rehabilitation Services Pager [phone] Office [phone]    Byrl Latin 05/20/2019, 12:23 PM

## 2019-05-20 NOTE — Progress Notes (Signed)
Physical Therapy Treatment Patient Details Name: [name] MRN: [id number] DOB: 01-13-1951 Today's Date: 05/20/2019    History of Present Illness Patient is 69 y.o. male s/p Lt TKR with I&D with polyexchange on 05/17/19. 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 He has had multiple revision arthroplasties, including resection with antibiotic spacers. Most recently he underwent TKR on 04/03/19 with 6 week antiobiotic treatment. Other significant PMH includes A-fib, hypothyroidism, GERD, headaches, CHF.    PT Comments    Pt performed HEP with assist but ambulation ltd by need for bathroom.   Follow Up Recommendations  Follow surgeon's recommendation for DC plan and follow-up therapies     Equipment Recommendations  None recommended by PT    Recommendations for Other Services OT consult     Precautions / Restrictions Precautions Precautions: Fall;Knee Precaution Comments: wound vac Required Braces or Orthoses: Knee Immobilizer - Left Knee Immobilizer - Left: Discontinue once straight leg raise with < 10 degree lag Restrictions Weight Bearing Restrictions: No LLE Weight Bearing: Weight bearing as tolerated    Mobility  Bed Mobility               General bed mobility comments: Pt up in chair and requests back to same  Transfers Overall transfer level: Needs assistance Equipment used: Rolling walker (2 wheeled) Transfers: Sit to/from Stand Sit to Stand: Modified independent (Device/Increase time)            Ambulation/Gait Ambulation/Gait assistance: Supervision Gait Distance (Feet): 15 Feet Assistive device: Rolling walker (2 wheeled) Gait Pattern/deviations: Step-to pattern;Step-through pattern;Shuffle;Trunk flexed Gait velocity: decreased   General Gait Details: cues for posture and position from [employer]             Wheelchair Mobility     Modified Rankin (Stroke Patients Only)       Balance Overall balance assessment: Needs assistance Sitting-balance support: Feet supported Sitting balance-Leahy Scale: Good     Standing balance support: Bilateral upper extremity supported;During functional activity Standing balance-Leahy Scale: Fair                              Cognition Arousal/Alertness: Awake/alert Behavior During Therapy: WFL for tasks assessed/performed Overall Cognitive Status: Within Functional Limits for tasks assessed                                        Exercises Total Joint Exercises Ankle Circles/Pumps: AROM;15 reps;Both Quad Sets: AROM;Both;Supine;15 reps Heel Slides: AAROM;AROM;Left;Supine;20 reps Straight Leg Raises: Left;Supine;20 reps;AAROM;AROM Long Arc Quad: Left;Seated;AAROM;AROM;20 reps    General Comments        Pertinent Vitals/Pain Pain Assessment: 0-10 Pain Score: 3  Pain Location: left knee Pain Descriptors / Indicators: Sore;Aching Pain Intervention(s): Limited activity within patient's tolerance;Monitored during session;Premedicated before session;Ice applied    Home Living                      Prior Function            PT Goals (current goals can now be found in the care plan section) Acute Rehab PT Goals Patient Stated Goal: to return home PT Goal Formulation: With patient Time For Goal Achievement: 05/24/19 Potential to Achieve Goals: Good Progress towards PT goals: Progressing toward goals  Frequency    7X/week      PT Plan Current plan remains appropriate    Co-evaluation              AM-PAC PT "6 Clicks" Mobility   Outcome Measure  Help needed turning from your back to your side while in a flat bed without using bedrails?: A Little Help needed moving from lying on your back to sitting on the side of a flat bed without using bedrails?: A Little Help needed moving to and from a bed to a chair  (including a wheelchair)?: A Little Help needed standing up from a chair using your arms (e.g., wheelchair or bedside chair)?: A Little Help needed to walk in hospital room?: A Little Help needed climbing 3-5 steps with a railing? : A Little 6 Click Score: 18    End of Session   Activity Tolerance: Patient tolerated treatment well Patient left: in chair;with call bell/phone within reach Nurse Communication: Mobility status PT Visit Diagnosis: Difficulty in walking, not elsewhere classified (R26.2)     Time: [id number] PT Time Calculation (min) (ACUTE ONLY): 15 min  Charges:  $Therapeutic Exercise: 8-22 mins                     Debe Coder PT Acute Rehabilitation Services Pager [phone] Office [phone]    Roselene Gray 05/20/2019, 12:15 PM

## 2019-05-20 NOTE — Progress Notes (Signed)
Subjective: 3 Days Post-Op Procedure(s) (LRB): IRRIGATION AND DEBRIDEMENT KNEE WITH POLY EXCHANGE (Left) Patient reports pain as mild.  No c/o. Feels ready to go home. Pain well controlled. Denies CP, SOB, fever, chills, N/V.  Objective: Vital signs in last 24 hours: Temp:  [97.6 F (36.4 C)-97.9 F (36.6 C)] 97.9 F (36.6 C) (04/11 0519) Pulse Rate:  [69-87] 69 (04/11 0519) Resp:  [16-18] 18 (04/11 0519) BP: (129-131)/(93-100) 131/100 (04/11 0519) SpO2:  [97 %-98 %] 97 % (04/11 0519)  Intake/Output from previous day: 04/10 0701 - 04/11 0700 In: 663.7 [I.V.:213.7; IV Piggyback:400] Out: 1500 [Urine:1500] Intake/Output this shift: Total I/O In: 257.5 [P.O.:240; I.V.:17.5] Out: 500 [Urine:500]  Recent Labs    05/18/19 0329 05/19/19 0558  HGB 9.0* 9.1*   Recent Labs    05/18/19 0329 05/19/19 0558  WBC 3.7* 8.3  RBC 3.10* 3.14*  HCT 30.8* 31.1*  PLT 229 278   Recent Labs    05/18/19 0329 05/19/19 0558  NA 138 138  K 4.4 4.5  CL 106 106  CO2 22 23  BUN 18 21  CREATININE 0.94 0.92  GLUCOSE 145* 132*  CALCIUM 9.6 10.0   No results for input(s): LABPT, INR in the last 72 hours.  Neurologically intact ABD soft Neurovascular intact Sensation intact distally Intact pulses distally Dorsiflexion/Plantar flexion intact Incision: dressing C/D/I and no drainage No cellulitis present Compartment soft No calf pain or sign of DVT. Prevena in place.   Assessment/Plan: 3 Days Post-Op Procedure(s) (LRB): IRRIGATION AND DEBRIDEMENT KNEE WITH POLY EXCHANGE (Left) Advance diet Up with therapy D/C IV fluids D/C home today IV abx x 6 weeks Has PICC Nursing to change prevena prior to D/C, portable unit at bedside  Cecilie Kicks 05/20/2019, 9:50 AM

## 2019-05-21 MED ORDER — METHOCARBAMOL 500 MG PO TABS: 500.0000 mg | ORAL_TABLET | Freq: Four times a day (QID) | ORAL | 0 refills | Status: DC | PRN

## 2019-05-21 MED ORDER — FERROUS SULFATE 325 (65 FE) MG PO TABS: 325.0000 mg | ORAL_TABLET | Freq: Three times a day (TID) | ORAL | 0 refills | Status: DC

## 2019-05-21 MED ORDER — POLYETHYLENE GLYCOL 3350 17 G PO PACK: 17.0000 g | PACK | Freq: Two times a day (BID) | ORAL | 0 refills | Status: DC

## 2019-05-21 MED ORDER — ACETAMINOPHEN 500 MG PO TABS: 1000.0000 mg | ORAL_TABLET | Freq: Three times a day (TID) | ORAL | 0 refills | Status: DC

## 2019-05-21 MED ORDER — HEPARIN SOD (PORK) LOCK FLUSH 100 UNIT/ML IV SOLN
250.0000 [IU] | INTRAVENOUS | Status: AC | PRN
  Administered 2019-05-21: 250 [IU]
  Filled 2019-05-21: qty 2.5

## 2019-05-21 MED ORDER — DOCUSATE SODIUM 100 MG PO CAPS: 100.0000 mg | ORAL_CAPSULE | Freq: Two times a day (BID) | ORAL | 0 refills | Status: DC

## 2019-05-21 NOTE — TOC Transition Note (Addendum)
Transition of Care [facility]) - CM/SW Discharge Note   Patient Details  Name: [name] MRN: [id number] Date of Birth: January 16, 1951  [facility] CM/SW Contact:  Lia Hopping, [place] Phone Number: 05/21/2019, 1:35 PM   Clinical Narrative:    Home Health arranged for RN- IV home infusion care and physical therapy.  [employer] brought the patient 2 additional Prevena Wound VAC canisters for home.  Home Health team will order routine canisters.   H.Health team and IV infusion team aware of patient d/c today.  No other needs identified.   Final next level of care: [place] Barriers to Discharge: Barriers Resolved   Patient Goals and CMS Choice Patient states their goals for this hospitalization and ongoing recovery are:: Continue Home IV antibiotcs CMS Medicare.gov Compare Post Acute Care list provided to:: Patient Choice offered to / list presented to : Patient  Discharge Placement  Home                     Discharge Plan and Services   Discharge Planning Services: CM Consult Post Acute Care Choice: Home Health            DME Agency: [employer]       HH Arranged: PT, RN, IV Antibiotics HH Agency: [facility] Date [facility] Agency Contacted: 05/18/19 Time [place]: 1007 Representative spoke with at [place]: [facility]/ [place] ([place]) Interventions     Readmission Risk Interventions No flowsheet data found.

## 2019-05-21 NOTE — Care Management Important Message (Signed)
Important Message  Patient Details IM Letter given to Kathrin Greathouse SW to present to the Patient Name: [name] MRN: [id number] Date of Birth: 12/16/1950   Medicare Important Message Given:  Yes     [name] 05/21/2019, 2:14 PM

## 2019-05-21 NOTE — Progress Notes (Signed)
Physical Therapy Treatment Patient Details Name: [name] MRN: [id number] DOB: December 05, 1950 Today's Date: 05/21/2019    History of Present Illness Patient is 69 y.o. male s/p Lt TKR with I&D with polyexchange on 05/17/19. 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 He has had multiple revision arthroplasties, including resection with antibiotic spacers. Most recently he underwent TKR on 04/03/19 with 6 week antiobiotic treatment. Other significant PMH includes A-fib, hypothyroidism, GERD, headaches, CHF.    PT Comments    Pt reports remaining overnight due to wound vac filling quickly.  Pt ambulated good distance in hallway however declined exercises at this time (believes knee motion contributing to more drainage into vac) so deferred for today.   Follow Up Recommendations  Follow surgeon's recommendation for DC plan and follow-up therapies     Equipment Recommendations  None recommended by PT    Recommendations for Other Services       Precautions / Restrictions Precautions Precautions: Fall;Knee Precaution Comments: wound vac Required Braces or Orthoses: (pt reports he has not been wearing KI) Restrictions LLE Weight Bearing: Weight bearing as tolerated    Mobility  Bed Mobility Overal bed mobility: Needs Assistance Bed Mobility: Supine to Sit     Supine to sit: Supervision        Transfers Overall transfer level: Modified independent Equipment used: Rolling walker (2 wheeled) Transfers: Sit to/from Stand              Ambulation/Gait Ambulation/Gait assistance: Supervision Gait Distance (Feet): 400 Feet Assistive device: Rolling walker (2 wheeled) Gait Pattern/deviations: Step-through pattern;Decreased stride length Gait velocity: decreased   General Gait Details: min cues for posture and position from [employer]             Wheelchair  Mobility    Modified Rankin (Stroke Patients Only)       Balance                                            Cognition Arousal/Alertness: Awake/alert Behavior During Therapy: WFL for tasks assessed/performed Overall Cognitive Status: Within Functional Limits for tasks assessed                                        Exercises      General Comments        Pertinent Vitals/Pain Pain Assessment: 0-10 Pain Score: 3  Pain Location: left knee Pain Descriptors / Indicators: Sore;Aching Pain Intervention(s): Repositioned;Monitored during session    Home Living                      Prior Function            PT Goals (current goals can now be found in the care plan section) Progress towards PT goals: Progressing toward goals    Frequency    7X/week      PT Plan Current plan remains appropriate    Co-evaluation              AM-PAC PT "6 Clicks" Mobility   Outcome Measure  Help needed turning from your back to your side while in a flat bed without using bedrails?: A Little Help needed moving from  lying on your back to sitting on the side of a flat bed without using bedrails?: A Little Help needed moving to and from a bed to a chair (including a wheelchair)?: A Little Help needed standing up from a chair using your arms (e.g., wheelchair or bedside chair)?: A Little Help needed to walk in hospital room?: A Little Help needed climbing 3-5 steps with a railing? : A Little 6 Click Score: 18    End of Session Equipment Utilized During Treatment: Gait belt Activity Tolerance: Patient tolerated treatment well Patient left: with call bell/phone within reach;in bed Nurse Communication: Mobility status PT Visit Diagnosis: Difficulty in walking, not elsewhere classified (R26.2)     Time: [id number] PT Time Calculation (min) (ACUTE ONLY): 13 min  Charges:  $Gait Training: 8-22 mins                     Arlyce Dice,  DPT Acute Rehabilitation Services Office: [place] 05/21/2019, 1:08 PM

## 2019-05-21 NOTE — Progress Notes (Signed)
     Subjective: 4 Days Post-Op Procedure(s) (LRB): IRRIGATION AND DEBRIDEMENT KNEE WITH POLY EXCHANGE (Left)   Patient reports pain as mild, pain controlled. No reported events throughout the night. Issues yesterday when he was about to leave yesterday in that the wound vac filled up.  Since that time it has been minimal drainage.  Discussed with [employer] rep and he is bringing more canisters for the patient.      Objective:   VITALS:   Vitals:   05/20/19 2124 05/21/19 0548  BP: 121/74 123/64  Pulse: 76 88  Resp: 16 15  Temp: 98 F (36.7 C) 97.6 F (36.4 C)  SpO2: 99% 98%    Dorsiflexion/Plantar flexion intact Incision: scant drainage No cellulitis present Compartment soft  LABS Recent Labs    05/19/19 0558  HGB 9.1*  HCT 31.1*  WBC 8.3  PLT 278    Recent Labs    05/19/19 0558  NA 138  K 4.5  BUN 21  CREATININE 0.92  GLUCOSE 132*     Assessment/Plan: 4 Days Post-Op Procedure(s) (LRB): IRRIGATION AND DEBRIDEMENT KNEE WITH POLY EXCHANGE (Left)   Up with therapy Discharge home with home health  Follow up in 5 days at [facility] Follow up with OLIN,Margarie Mcguirt D in 5 days.  Contact information:  [facility] [address], [address] [place] [id number]             Danae Orleans PA-C  [facility]  [place] [address], [place], [place], [place] [address] Phone: [phone] [email or web address] Facebook  [employer]

## 2019-05-24 NOTE — Discharge Summary (Signed)
Physician Discharge Summary  Patient ID: [name] MRN: [id number] DOB/AGE: February 14, 1950 69 y.o.  Admit date: 05/17/2019 Discharge date: 05/21/2019   Procedures:  Procedure(s) (LRB): IRRIGATION AND DEBRIDEMENT KNEE WITH POLY EXCHANGE (Left)  Attending Physician:  Dr. Paralee Cancel   Admission Diagnoses:   Persistent left knee drainage, s/p left TKA revision/reimplantation  Discharge Diagnoses:  Active Problems:   S/P rev left TK  Past Medical History:  Diagnosis Date  . CHF (congestive heart failure) ([place])   . Chronic anticoagulation    with Xarelto  . Complication of anesthesia    PT STATES HARD TO WAKE UP AFTER ONE SUGERY -STATES THE SURGERY TOOK LONGER THAN EXPECTED.  NO PROBLEMS WITH ANY OTHER SURGERY  . Complication of anesthesia    in 12/2018-states started  shaking after surgery, could not get warm   . Dysrhythmia    A-fib  . GERD (gastroesophageal reflux disease)   . Headache   . History of blood clots   . History of blood transfusion   . Hypothyroidism   . Pain    BACK PAIN - PT ATTRIBUTES TO THE WAY HE WALKS DUE TO LEFT KNEE PROBLEM  . Persistent atrial fibrillation ([place])   . Pneumonia    walking   35 years ago  . Septic arthritis of knee ([facility])    LEFT KNEE  . Shortness of breath    WITH EXERTION AND PAIN  . Sleep apnea    uses CPAP,    HPI:    [name], 69 y.o. 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 He has had multiple revision arthroplasties, including resection with antibiotic spacers. Most recently, he underwent left total knee revision arthroplasty with placement of PICC line and a 6 week course of home antibiotic therapy. He was seen in the clinic post-operatively with persistent serous drainage. Dr. Alvan Dame discussed treatment options with him, including risks,  benefits, and expectations. We will plan for repeat irrigation and debridement with possible polyethylene exchange. He will have a new PICC line placed during hospitalization, with plans for home IV antibiotic therapy.  PCP: Bernerd Limbo, MD   Discharged Condition: good  Hospital Course:  Patient underwent the above stated procedure on 05/17/2019. Patient tolerated the procedure well and brought to the recovery room in good condition and subsequently to the floor.  POD #1 BP: 132/84 ; Pulse: 88 ; Temp: 97.7 F (36.5 C) ; Resp: 16 Patient reports pain as mild, pain controlled with medication.  No reported events overnight.  Dr. Alvan Dame discussed the procedure, findings and expectations moving forward.  Dr. Alvan Dame described the [facility] dressing and discussed how he will be able to go home with this dressing.  Patient will continue with home health for antibiotics.  Order placed for IR to place a new PICC line for the home antibiotics. Dorsiflexion/plantar flexion intact, incision: dressing C/D/I, no cellulitis present and compartment soft.   LABS  Basename    HGB     9.0  HCT     30.8   POD #2  BP: 128/92 ; Pulse: 82 ; Temp: 97.8 F (36.6 C) ; Resp: 15  Patient reports pain as minimal.  He states he has had a lot of drainage in the VAC. No cellulitis present.  Compartment soft. VAC functioning well  LABS  [employer]  HGB     9.1  HCT     31.1   POD #3  BP: 131/100 ; Pulse: 69 ; Temp: 97.9 F (36.6 C) ; Resp: 18 Patient reports pain as mild.  No c/o. Feels ready to go home. Pain well controlled. Denies CP, SOB, fever, chills, N/V. Neurologically intact. ABD soft. Neurovascular intact. Sensation intact distally. Intact pulses distally. Dorsiflexion/Plantar flexion intact. Incision: dressing C/D/I and no drainage. No cellulitis present. Compartment soft. No calf pain or sign of DVT. Prevena in place.  LABS   No new labs  POD #4  BP: 123/64 ; Pulse: 88 ; Temp: 97.6 F (36.4 C) ; Resp:  15 Patient reports pain as mild, pain controlled. No reported events throughout the night. Issues yesterday when he was about to leave yesterday in that the wound vac filled up.  Since that time it has been minimal drainage.  Discussed with [employer] rep and he is bringing more canisters for the patient. Dorsiflexion/plantar flexion intact, incision: dressing C/D/I, no cellulitis present and compartment soft.   LABS   No new labs    Discharge Exam: General appearance: alert, cooperative and no distress Extremities: Homans sign is negative, no sign of DVT, no edema, redness or tenderness in the calves or thighs and no ulcers, gangrene or trophic changes  Disposition: Home with follow up in 2 weeks   Follow-up Information    Paralee Cancel, MD. Schedule an appointment as soon as possible for a visit on 05/25/2019.   Specialty: Orthopedic Surgery Contact information: [address] [place] [address] [phone]        [facility] Follow up.   Specialty: Home Health Services Why: agency will provide home health physical therapy and nurse.  Contact information: [place] [address] [phone]           Discharge Instructions    Call MD / Call 911   Complete by: As directed    If you experience chest pain or shortness of breath, CALL 911 and be transported to the hospital emergency room.  If you develope a fever above 101 F, pus (white drainage) or increased drainage or redness at the wound, or calf pain, call your surgeon's office.   Call MD / Call 911   Complete by: As directed    If you experience chest pain or shortness of breath, CALL 911 and be transported to the hospital emergency room.  If you develope a fever above 101 F, pus (white drainage) or increased drainage or redness at the wound, or calf pain, call your surgeon's office.   Constipation Prevention   Complete by: As directed    Drink plenty of fluids.  Prune juice may  be helpful.  You may use a stool softener, such as Colace (over the counter) 100 mg twice a day.  Use MiraLax (over the counter) for constipation as needed.   Constipation Prevention   Complete by: As directed    Drink plenty of fluids.  Prune juice may be helpful.  You may use a stool softener, such as Colace (over the counter) 100 mg twice a day.  Use MiraLax (over the counter) for constipation as needed.   Diet - low sodium heart healthy   Complete by: As directed    Diet - low sodium heart healthy   Complete by: As directed    Discharge instructions   Complete by: As directed    Wound vac dressing remain in  place until follow up in the clinic.  Follow [facility] in 5 [facility] at [facility]. Call with any questions or concerns.   Home infusion instructions   Complete by: As directed    Instructions: Flushing of vascular access device: 0.9% NaCl pre/post medication administration and prn patency; Heparin 100 u/ml, 36m for implanted ports and Heparin 10u/ml, 559mfor all other central venous catheters.   Increase activity slowly as tolerated   Complete by: As directed    Increase activity slowly as tolerated   Complete by: As directed    TED hose   Complete by: As directed    Use stockings (TED hose) for 2 weeks on both leg(s).  You may remove them at night for sleeping.      Allergies as of 05/21/2019      Reactions   Morphine Hives      Medication List    TAKE these medications   acetaminophen 500 MG tablet Commonly known as: TYLENOL Take 2 tablets (1,000 mg total) by mouth every 8 (eight) hours.   cefTRIAXone  IVPB Commonly known as: ROCEPHIN Inject 2 g into the vein daily. Indication:  Prosthetic Joint Infection Last Day of Therapy:  06/29/19 Labs - Once weekly:  CBC/D and BMP, Labs - Every other week:  ESR and CRP What changed: additional instructions   diltiazem 240 MG 24 hr capsule Commonly known as: CARDIZEM CD TAKE 1 CAPSULE BY MOUTH DAILY   docusate sodium 100 MG  capsule Commonly known as: Colace Take 1 capsule (100 mg total) by mouth 2 (two) times daily.   ferrous sulfate 325 (65 FE) MG tablet Commonly known as: FerrouSul Take 1 tablet (325 mg total) by mouth 3 (three) times daily with meals for 14 days.   levothyroxine 125 MCG tablet Commonly known as: SYNTHROID Take 125 mcg by mouth daily before breakfast.   methocarbamol 500 MG tablet Commonly known as: Robaxin Take 1 tablet (500 mg total) by mouth every 6 (six) hours as needed for muscle spasms.   metoprolol tartrate 50 MG tablet Commonly known as: LOPRESSOR TAKE 1 TABLET BY MOUTH TWICE DAILY   multivitamin with minerals Tabs tablet Take 1 tablet by mouth daily.   oxyCODONE 5 MG immediate release tablet Commonly known as: Oxy IR/ROXICODONE Take 1-2 tablets (5-10 mg total) by mouth every 4 (four) hours as needed for moderate pain or severe pain.   polyethylene glycol 17 g packet Commonly known as: MIRALAX / GLYCOLAX Take 17 g by mouth 2 (two) times daily.   rivaroxaban 20 MG Tabs tablet Commonly known as: Xarelto Take 1 tablet (20 mg total) by mouth daily with breakfast.   vancomycin  IVPB Inject 1,000 mg into the vein every 12 (twelve) hours. Indication:  Prosthetic Joint Infection Last Day of Therapy:  06/29/19 Labs - Sunday/Monday:  CBC/D, BMP, and vancomycin trough. Labs - Thursday:  BMP and vancomycin trough Labs - Every other week:  ESR and CRP What changed:   how much to take  additional instructions            Home Infusion Instuctions  (From admission, onward)         Start     Ordered   05/20/19 0000  Home infusion instructions    Question:  Instructions  Answer:  Flushing of vascular access device: 0.9% NaCl pre/post medication administration and prn patency; Heparin 100 u/ml, 107m33mor implanted ports and Heparin 10u/ml, 107ml42mr all other central venous catheters.   05/20/19 1008  Signed: [name]. Charlotte Fidalgo   PA-C  05/24/2019, 9:45 AM

## 2019-08-08 ENCOUNTER — Encounter (HOSPITAL_COMMUNITY): Payer: Self-pay

## 2019-08-08 NOTE — Patient Instructions (Addendum)
DUE TO COVID-19 ONLY ONE VISITOR IS ALLOWED TO COME WITH YOU AND STAY IN THE WAITING ROOM ONLY DURING PRE OP AND PROCEDURE DAY OF SURGERY. THE 2 VISITORS  MAY VISIT WITH YOU AFTER SURGERY IN YOUR PRIVATE ROOM DURING VISITING HOURS ONLY!  YOU NEED TO HAVE A COVID 19 TEST ON_7/2/21______ @_9 :35______, THIS TEST MUST BE DONE BEFORE SURGERY, COME  [address] , [id number].  ([place]) ONCE YOUR COVID TEST IS COMPLETED, PLEASE BEGIN THE QUARANTINE INSTRUCTIONS AS OUTLINED IN YOUR HANDOUT.                [name]    Your procedure is scheduled on: 08/14/19   Report to [facility] Main  Entrance   Report to admitting at 3:00 pm      Call this number if you have problems the morning of surgery [place], [place].   Do not eat food After Midnight.   YOU MAY HAVE CLEAR LIQUIDS FROM MIDNIGHT UNTIL 2:00 PM    CLEAR LIQUID DIET   Foods Allowed                                                                     Foods Excluded  Coffee and tea, regular and decaf                             liquids that you cannot  Plain Jell-O any favor except red or purple                                           see through such as: Fruit ices (not with fruit pulp)                                     milk, soups, orange juice  Iced Popsicles                                    All solid food Carbonated beverages, regular and diet                                    Cranberry, grape and apple juices Sports drinks like Gatorade Lightly seasoned clear broth or consume(fat free) Sugar, honey syrup   At 2:00 PM  Please finish the prescribed Pre-Surgery  drink  . Nothing by mouth after you finish the  drink !    Take these medicines the morning of surgery with A SIP OF WATER: Metoprolol, Diltiazem, Levothyroxine. Bring mask and tubing to the hospital with you  You may not have any metal on your body including hair pins and              piercings  Do not wear jewelry, make-up, lotions, powders or perfumes, deodorant         .              Men may shave face and neck.   Do not bring valuables to the hospital. [place].  Contacts, dentures or bridgework may not be worn into surgery.       Patients discharged the day of surgery will not be allowed to drive home  . IF YOU ARE HAVING SURGERY AND GOING HOME THE SAME DAY, YOU MUST HAVE AN ADULT TO DRIVE YOU HOME AND BE WITH YOU FOR 24 HOURS  . YOU MAY GO HOME BY TAXI OR UBER OR ORTHERWISE, BUT AN ADULT MUST ACCOMPANY YOU HOME AND STAY WITH YOU FOR 24 HOURS.  Name and phone number of your driver:  Special Instructions: N/A              Please read over the following fact sheets you were given: _____________________________________________________________________             [facility] - Preparing for Surgery  Before surgery, you can play an important role.   Because skin is not sterile, your skin needs to be as free of germs as possible.   You can reduce the number of germs on your skin by washing with CHG (chlorahexidine gluconate) soap before surgery.   CHG is an antiseptic cleaner which kills germs and bonds with the skin to continue killing germs even after washing. Please DO NOT use if you have an allergy to CHG or antibacterial soaps.   If your skin becomes reddened/irritated stop using the CHG and inform your nurse when you arrive at Short Stay. Do not shave (including legs and underarms) for at least 48 hours prior to the first CHG shower.    Please follow these instructions carefully:  1.  Shower with CHG Soap the night before surgery and the  morning of Surgery.  2.  If you choose to wash your hair, wash your hair first as usual with your  normal  shampoo.  3.  After you shampoo, rinse your hair and body  thoroughly to remove the  shampoo.                                        4.  Use CHG as you would any other liquid soap.  You can apply chg directly  to the skin and wash                       Gently with a scrungie or clean washcloth.  5.  Apply the CHG Soap to your body ONLY FROM THE NECK DOWN.   Do not use on face/ open                           Wound or open sores. Avoid contact with eyes, ears mouth and genitals (private parts).                       Wash  face,  Genitals (private parts) with your normal soap.             6.  Wash thoroughly, paying special attention to the area where your surgery  will be performed.  7.  Thoroughly rinse your body with warm water from the neck down.  8.  DO NOT shower/wash with your normal soap after using and rinsing off  the CHG Soap.             9.  Pat yourself dry with a clean towel.            10.  Wear clean pajamas.            11.  Place clean sheets on your bed the night of your first shower and do not  sleep with pets. Day of Surgery : Do not apply any lotions/deodorants the morning of surgery.  Please wear clean clothes to the hospital/surgery center.  FAILURE TO FOLLOW THESE INSTRUCTIONS MAY RESULT IN THE CANCELLATION OF YOUR SURGERY PATIENT SIGNATURE_________________________________  NURSE SIGNATURE__________________________________  ________________________________________________________________________

## 2019-08-09 ENCOUNTER — Encounter (INDEPENDENT_AMBULATORY_CARE_PROVIDER_SITE_OTHER): Payer: Self-pay

## 2019-08-09 ENCOUNTER — Encounter (HOSPITAL_COMMUNITY): Payer: Self-pay

## 2019-08-09 ENCOUNTER — Other Ambulatory Visit: Payer: Self-pay

## 2019-08-09 ENCOUNTER — Encounter (HOSPITAL_COMMUNITY)
Admission: RE | Admit: 2019-08-09 | Discharge: 2019-08-09 | Disposition: A | Discharge status: Home / Self Care | Payer: Medicare Other | Source: Ambulatory Visit | Attending: Orthopedic Surgery | Admitting: Orthopedic Surgery

## 2019-08-09 DIAGNOSIS — I10 Essential (primary) hypertension: Secondary | ICD-10-CM | POA: Diagnosis not present

## 2019-08-09 DIAGNOSIS — Z01818 Encounter for other preprocedural examination: Secondary | ICD-10-CM | POA: Diagnosis present

## 2019-08-09 LAB — BASIC METABOLIC PANEL
Anion gap: 8 (ref 5–15)
BUN: 21 mg/dL (ref 8–23)
CO2: 25 mmol/L (ref 22–32)
Calcium: 9.5 mg/dL (ref 8.9–10.3)
Chloride: 108 mmol/L (ref 98–111)
Creatinine, Ser: 1.22 mg/dL (ref 0.61–1.24)
GFR calc Af Amer: >60 mL/min (ref >60)
GFR calc non Af Amer: >60 mL/min (ref >60)
Glucose, Bld: 99 mg/dL (ref 70–99)
Potassium: 4.4 mmol/L (ref 3.5–5.1)
Sodium: 141 mmol/L (ref 135–145)

## 2019-08-09 LAB — CBC
HCT: 37.4 % — ABNORMAL LOW (ref 39.0–52.0)
Hemoglobin: 11.5 g/dL — ABNORMAL LOW (ref 13.0–17.0)
MCH: 30.1 pg (ref 26.0–34.0)
MCHC: 30.7 g/dL (ref 30.0–36.0)
MCV: 97.9 fL (ref 80.0–100.0)
Platelets: 213 10*3/uL (ref 150–400)
RBC: 3.82 MIL/uL — ABNORMAL LOW (ref 4.22–5.81)
RDW: 15.2 % (ref 11.5–15.5)
WBC: 4.3 10*3/uL (ref 4.0–10.5)
nRBC: 0 % (ref 0.0–0.2)

## 2019-08-09 NOTE — Progress Notes (Signed)
COVID Vaccine Completed:Yes Date COVID Vaccine completed:05/11/19 COVID vaccine manufacturer: Pfizer     PCP - Dr. Mariann Laster Cardiologist - Dr. Jerilynn Mages. [place]  Chest x-ray - no EKG - 08/09/19 Stress Test - no ECHO - 2014 Cardiac Cath - 2006  Sleep Study - yes CPAP - yes  Fasting Blood Sugar - NA Checks Blood Sugar _____ times a day  Blood Thinner Instructions:Xarelto Aspirin Instructions:Dr. Alvan Dame said stop 3 days prior to DOS Last Dose:08/11/19  Anesthesia review:   Patient denies shortness of breath, fever, cough and chest pain at PAT appointment  yes   Patient verbalized understanding of instructions that were given to them at the PAT appointment. Patient was also instructed that they will need to review over the PAT instructions again at home before surgery.  Yes  Pt uses a walker. His BMI is 56.15 and is SOB with most ADLs.

## 2019-08-10 ENCOUNTER — Other Ambulatory Visit (HOSPITAL_COMMUNITY)
Admission: RE | Admit: 2019-08-10 | Discharge: 2019-08-10 | Disposition: A | Discharge status: Home / Self Care | Payer: Medicare Other | Source: Ambulatory Visit | Attending: Orthopedic Surgery | Admitting: Orthopedic Surgery

## 2019-08-10 DIAGNOSIS — Z20822 Contact with and (suspected) exposure to covid-19: Secondary | ICD-10-CM | POA: Insufficient documentation

## 2019-08-10 DIAGNOSIS — Z01812 Encounter for preprocedural laboratory examination: Secondary | ICD-10-CM | POA: Insufficient documentation

## 2019-08-10 LAB — SARS CORONAVIRUS 2 (TAT 6-24 HRS): SARS Coronavirus 2: NEGATIVE

## 2019-08-14 ENCOUNTER — Other Ambulatory Visit: Payer: Self-pay

## 2019-08-14 ENCOUNTER — Ambulatory Visit (HOSPITAL_COMMUNITY): Payer: Medicare Other | Admitting: Anesthesiology

## 2019-08-14 ENCOUNTER — Encounter (HOSPITAL_COMMUNITY): Payer: Self-pay | Admitting: Orthopedic Surgery

## 2019-08-14 ENCOUNTER — Inpatient Hospital Stay (HOSPITAL_COMMUNITY)
Admission: RE | Admit: 2019-08-14 | Discharge: 2019-08-16 | DRG: 464 | Disposition: A | Discharge status: Home / Self Care | Payer: Medicare Other | Attending: Orthopedic Surgery | Admitting: Orthopedic Surgery

## 2019-08-14 ENCOUNTER — Encounter (HOSPITAL_COMMUNITY)
Admission: RE | Disposition: A | Discharge status: Home / Self Care | Payer: Self-pay | Source: Home / Self Care | Attending: Orthopedic Surgery

## 2019-08-14 ENCOUNTER — Ambulatory Visit (HOSPITAL_COMMUNITY): Payer: Medicare Other | Admitting: Emergency Medicine

## 2019-08-14 DIAGNOSIS — T8454XA Infection and inflammatory reaction due to internal left knee prosthesis, initial encounter: Principal | ICD-10-CM | POA: Diagnosis present

## 2019-08-14 DIAGNOSIS — Z8614 Personal history of Methicillin resistant Staphylococcus aureus infection: Secondary | ICD-10-CM

## 2019-08-14 DIAGNOSIS — S81009A Unspecified open wound, unspecified knee, initial encounter: Secondary | ICD-10-CM | POA: Diagnosis present

## 2019-08-14 DIAGNOSIS — Z86718 Personal history of other venous thrombosis and embolism: Secondary | ICD-10-CM

## 2019-08-14 DIAGNOSIS — G4733 Obstructive sleep apnea (adult) (pediatric): Secondary | ICD-10-CM | POA: Diagnosis present

## 2019-08-14 DIAGNOSIS — E039 Hypothyroidism, unspecified: Secondary | ICD-10-CM | POA: Diagnosis present

## 2019-08-14 DIAGNOSIS — Z79899 Other long term (current) drug therapy: Secondary | ICD-10-CM

## 2019-08-14 DIAGNOSIS — I5032 Chronic diastolic (congestive) heart failure: Secondary | ICD-10-CM | POA: Diagnosis present

## 2019-08-14 DIAGNOSIS — Z96651 Presence of right artificial knee joint: Secondary | ICD-10-CM | POA: Diagnosis present

## 2019-08-14 DIAGNOSIS — T8459XA Infection and inflammatory reaction due to other internal joint prosthesis, initial encounter: Secondary | ICD-10-CM

## 2019-08-14 DIAGNOSIS — Z888 Allergy status to other drugs, medicaments and biological substances status: Secondary | ICD-10-CM

## 2019-08-14 DIAGNOSIS — I4819 Other persistent atrial fibrillation: Secondary | ICD-10-CM | POA: Diagnosis present

## 2019-08-14 DIAGNOSIS — I110 Hypertensive heart disease with heart failure: Secondary | ICD-10-CM | POA: Diagnosis present

## 2019-08-14 DIAGNOSIS — Z79891 Long term (current) use of opiate analgesic: Secondary | ICD-10-CM

## 2019-08-14 DIAGNOSIS — K219 Gastro-esophageal reflux disease without esophagitis: Secondary | ICD-10-CM | POA: Diagnosis present

## 2019-08-14 DIAGNOSIS — Z6841 Body Mass Index (BMI) 40.0 and over, adult: Secondary | ICD-10-CM

## 2019-08-14 DIAGNOSIS — Z792 Long term (current) use of antibiotics: Secondary | ICD-10-CM

## 2019-08-14 DIAGNOSIS — T8459XD Infection and inflammatory reaction due to other internal joint prosthesis, subsequent encounter: Secondary | ICD-10-CM

## 2019-08-14 DIAGNOSIS — Y831 Surgical operation with implant of artificial internal device as the cause of abnormal reaction of the patient, or of later complication, without mention of misadventure at the time of the procedure: Secondary | ICD-10-CM | POA: Diagnosis present

## 2019-08-14 DIAGNOSIS — Z79890 Hormone replacement therapy (postmenopausal): Secondary | ICD-10-CM

## 2019-08-14 DIAGNOSIS — Z86711 Personal history of pulmonary embolism: Secondary | ICD-10-CM

## 2019-08-14 DIAGNOSIS — Z7901 Long term (current) use of anticoagulants: Secondary | ICD-10-CM

## 2019-08-14 HISTORY — PX: I & D EXTREMITY: SHX5045

## 2019-08-14 LAB — TYPE AND SCREEN
ABO/RH(D): O POS
Antibody Screen: NEGATIVE

## 2019-08-14 SURGERY — IRRIGATION AND DEBRIDEMENT EXTREMITY
Anesthesia: General | Laterality: Left

## 2019-08-14 MED ORDER — DEXAMETHASONE SODIUM PHOSPHATE 10 MG/ML IJ SOLN
10.0000 mg | Freq: Once | INTRAMUSCULAR | Status: AC
  Administered 2019-08-15: 10 mg via INTRAVENOUS
  Filled 2019-08-14: qty 1

## 2019-08-14 MED ORDER — DEXTROSE 5 % IV SOLN
3.0000 g | INTRAVENOUS | Status: AC
  Administered 2019-08-14: 3 g via INTRAVENOUS
  Filled 2019-08-14: qty 3

## 2019-08-14 MED ORDER — FENTANYL CITRATE (PF) 250 MCG/5ML IJ SOLN
INTRAMUSCULAR | Status: AC
  Filled 2019-08-14: qty 5

## 2019-08-14 MED ORDER — HYDROMORPHONE HCL 1 MG/ML IJ SOLN
INTRAMUSCULAR | Status: AC
  Administered 2019-08-14: 0.5 mg via INTRAVENOUS
  Filled 2019-08-14: qty 1

## 2019-08-14 MED ORDER — ONDANSETRON HCL 4 MG/2ML IJ SOLN: 4.0000 mg | Freq: Four times a day (QID) | INTRAMUSCULAR | Status: DC | PRN

## 2019-08-14 MED ORDER — SODIUM CHLORIDE 0.9 % IR SOLN
Status: DC | PRN
  Administered 2019-08-14: 3000 mL

## 2019-08-14 MED ORDER — POLYETHYLENE GLYCOL 3350 17 G PO PACK
17.0000 g | PACK | Freq: Two times a day (BID) | ORAL | Status: DC
  Administered 2019-08-14 – 2019-08-16 (×3): 17 g via ORAL
  Filled 2019-08-14 (×3): qty 1

## 2019-08-14 MED ORDER — METHOCARBAMOL 500 MG PO TABS
500.0000 mg | ORAL_TABLET | Freq: Four times a day (QID) | ORAL | Status: DC | PRN
  Administered 2019-08-14 – 2019-08-15 (×3): 500 mg via ORAL
  Filled 2019-08-14 (×3): qty 1

## 2019-08-14 MED ORDER — BISACODYL 10 MG RE SUPP: 10.0000 mg | Freq: Every day | RECTAL | Status: DC | PRN

## 2019-08-14 MED ORDER — CHLORHEXIDINE GLUCONATE 0.12 % MT SOLN
15.0000 mL | Freq: Once | OROMUCOSAL | Status: AC
  Administered 2019-08-14: 15 mL via OROMUCOSAL

## 2019-08-14 MED ORDER — DEXAMETHASONE SODIUM PHOSPHATE 10 MG/ML IJ SOLN
INTRAMUSCULAR | Status: AC
  Filled 2019-08-14: qty 1

## 2019-08-14 MED ORDER — ORAL CARE MOUTH RINSE: 15.0000 mL | Freq: Once | OROMUCOSAL | Status: AC

## 2019-08-14 MED ORDER — METOCLOPRAMIDE HCL 5 MG/ML IJ SOLN: 5.0000 mg | Freq: Three times a day (TID) | INTRAMUSCULAR | Status: DC | PRN

## 2019-08-14 MED ORDER — DEXAMETHASONE SODIUM PHOSPHATE 10 MG/ML IJ SOLN
10.0000 mg | Freq: Once | INTRAMUSCULAR | Status: AC
  Administered 2019-08-14: 10 mg via INTRAVENOUS

## 2019-08-14 MED ORDER — PROPOFOL 10 MG/ML IV BOLUS
INTRAVENOUS | Status: DC | PRN
Start: 2019-08-14 — End: 2019-08-14
  Administered 2019-08-14: 200 mg via INTRAVENOUS

## 2019-08-14 MED ORDER — FENTANYL CITRATE (PF) 100 MCG/2ML IJ SOLN
25.0000 ug | INTRAMUSCULAR | Status: DC | PRN
  Administered 2019-08-14: 50 ug via INTRAVENOUS

## 2019-08-14 MED ORDER — POVIDONE-IODINE 10 % EX SWAB
2.0000 "application " | Freq: Once | CUTANEOUS | Status: AC
  Administered 2019-08-14: 2 via TOPICAL

## 2019-08-14 MED ORDER — DOCUSATE SODIUM 100 MG PO CAPS
100.0000 mg | ORAL_CAPSULE | Freq: Two times a day (BID) | ORAL | Status: DC
  Administered 2019-08-14 – 2019-08-16 (×4): 100 mg via ORAL
  Filled 2019-08-14 (×4): qty 1

## 2019-08-14 MED ORDER — FERROUS SULFATE 325 (65 FE) MG PO TABS
325.0000 mg | ORAL_TABLET | Freq: Two times a day (BID) | ORAL | Status: DC
  Administered 2019-08-15 – 2019-08-16 (×3): 325 mg via ORAL
  Filled 2019-08-14 (×3): qty 1

## 2019-08-14 MED ORDER — ONDANSETRON HCL 4 MG PO TABS: 4.0000 mg | ORAL_TABLET | Freq: Four times a day (QID) | ORAL | Status: DC | PRN

## 2019-08-14 MED ORDER — DIPHENHYDRAMINE HCL 12.5 MG/5ML PO ELIX
12.5000 mg | ORAL_SOLUTION | ORAL | Status: DC | PRN
  Administered 2019-08-14: 25 mg via ORAL
  Filled 2019-08-14: qty 10

## 2019-08-14 MED ORDER — LIDOCAINE HCL (CARDIAC) PF 100 MG/5ML IV SOSY
PREFILLED_SYRINGE | INTRAVENOUS | Status: DC | PRN
  Administered 2019-08-14: 100 mg via INTRAVENOUS

## 2019-08-14 MED ORDER — FENTANYL CITRATE (PF) 250 MCG/5ML IJ SOLN
INTRAMUSCULAR | Status: DC | PRN
  Administered 2019-08-14 (×3): 50 ug via INTRAVENOUS
  Administered 2019-08-14: 100 ug via INTRAVENOUS

## 2019-08-14 MED ORDER — OXYCODONE HCL 5 MG/5ML PO SOLN: 5.0000 mg | Freq: Once | ORAL | Status: DC | PRN

## 2019-08-14 MED ORDER — LACTATED RINGERS IV SOLN: INTRAVENOUS | Status: DC

## 2019-08-14 MED ORDER — OXYCODONE HCL 5 MG PO TABS
5.0000 mg | ORAL_TABLET | ORAL | Status: DC | PRN
  Administered 2019-08-15 – 2019-08-16 (×3): 10 mg via ORAL
  Filled 2019-08-14 (×2): qty 2

## 2019-08-14 MED ORDER — VANCOMYCIN HCL 10 G IV SOLR
2500.0000 mg | Freq: Once | INTRAVENOUS | Status: AC
  Administered 2019-08-14: 2500 mg via INTRAVENOUS
  Filled 2019-08-14: qty 2000

## 2019-08-14 MED ORDER — CELECOXIB 200 MG PO CAPS
200.0000 mg | ORAL_CAPSULE | Freq: Two times a day (BID) | ORAL | Status: DC
  Administered 2019-08-14 – 2019-08-16 (×4): 200 mg via ORAL
  Filled 2019-08-14 (×4): qty 1

## 2019-08-14 MED ORDER — ALUM & MAG HYDROXIDE-SIMETH 200-200-20 MG/5ML PO SUSP: 15.0000 mL | ORAL | Status: DC | PRN

## 2019-08-14 MED ORDER — METHOCARBAMOL 500 MG IVPB - SIMPLE MED
500.0000 mg | Freq: Four times a day (QID) | INTRAVENOUS | Status: DC | PRN
  Administered 2019-08-14: 500 mg via INTRAVENOUS
  Filled 2019-08-14: qty 50

## 2019-08-14 MED ORDER — FENTANYL CITRATE (PF) 100 MCG/2ML IJ SOLN: 25.0000 ug | INTRAMUSCULAR | Status: DC | PRN

## 2019-08-14 MED ORDER — LIDOCAINE 2% (20 MG/ML) 5 ML SYRINGE
INTRAMUSCULAR | Status: AC
  Filled 2019-08-14: qty 5

## 2019-08-14 MED ORDER — MIDAZOLAM HCL 2 MG/2ML IJ SOLN
INTRAMUSCULAR | Status: AC
  Filled 2019-08-14: qty 2

## 2019-08-14 MED ORDER — 0.9 % SODIUM CHLORIDE (POUR BTL) OPTIME
TOPICAL | Status: DC | PRN
  Administered 2019-08-14: 1000 mL

## 2019-08-14 MED ORDER — VANCOMYCIN HCL 1250 MG/250ML IV SOLN
1250.0000 mg | Freq: Two times a day (BID) | INTRAVENOUS | Status: DC
  Administered 2019-08-15 – 2019-08-16 (×3): 1250 mg via INTRAVENOUS
  Filled 2019-08-14 (×4): qty 250

## 2019-08-14 MED ORDER — METOPROLOL TARTRATE 50 MG PO TABS
50.0000 mg | ORAL_TABLET | Freq: Two times a day (BID) | ORAL | Status: DC
  Administered 2019-08-14 – 2019-08-16 (×4): 50 mg via ORAL
  Filled 2019-08-14 (×4): qty 1

## 2019-08-14 MED ORDER — ONDANSETRON HCL 4 MG/2ML IJ SOLN
INTRAMUSCULAR | Status: AC
  Filled 2019-08-14: qty 2

## 2019-08-14 MED ORDER — ACETAMINOPHEN 325 MG PO TABS: 325.0000 mg | ORAL_TABLET | ORAL | Status: DC | PRN

## 2019-08-14 MED ORDER — TRANEXAMIC ACID-NACL 1000-0.7 MG/100ML-% IV SOLN
1000.0000 mg | INTRAVENOUS | Status: AC
  Administered 2019-08-14: 1000 mg via INTRAVENOUS
  Filled 2019-08-14: qty 100

## 2019-08-14 MED ORDER — ACETAMINOPHEN 160 MG/5ML PO SOLN: 325.0000 mg | ORAL | Status: DC | PRN

## 2019-08-14 MED ORDER — DILTIAZEM HCL ER COATED BEADS 240 MG PO CP24
240.0000 mg | ORAL_CAPSULE | Freq: Every day | ORAL | Status: DC
  Administered 2019-08-15 – 2019-08-16 (×2): 240 mg via ORAL
  Filled 2019-08-14 (×2): qty 1

## 2019-08-14 MED ORDER — ONDANSETRON HCL 4 MG/2ML IJ SOLN
INTRAMUSCULAR | Status: DC | PRN
Start: 2019-08-14 — End: 2019-08-14
  Administered 2019-08-14: 4 mg via INTRAVENOUS

## 2019-08-14 MED ORDER — FENTANYL CITRATE (PF) 100 MCG/2ML IJ SOLN
INTRAMUSCULAR | Status: AC
  Administered 2019-08-14: 50 ug via INTRAVENOUS
  Filled 2019-08-14: qty 2

## 2019-08-14 MED ORDER — RIVAROXABAN 10 MG PO TABS
10.0000 mg | ORAL_TABLET | ORAL | Status: DC
  Administered 2019-08-15 – 2019-08-16 (×2): 10 mg via ORAL
  Filled 2019-08-14 (×2): qty 1

## 2019-08-14 MED ORDER — OXYCODONE HCL 5 MG PO TABS
10.0000 mg | ORAL_TABLET | ORAL | Status: DC | PRN
  Administered 2019-08-14: 15 mg via ORAL
  Filled 2019-08-14: qty 2
  Filled 2019-08-14: qty 3

## 2019-08-14 MED ORDER — OXYCODONE HCL 5 MG PO TABS: 5.0000 mg | ORAL_TABLET | Freq: Once | ORAL | Status: DC | PRN

## 2019-08-14 MED ORDER — METHOCARBAMOL 500 MG IVPB - SIMPLE MED
INTRAVENOUS | Status: AC
  Filled 2019-08-14: qty 50

## 2019-08-14 MED ORDER — PHENOL 1.4 % MT LIQD: 1.0000 | OROMUCOSAL | Status: DC | PRN

## 2019-08-14 MED ORDER — BUPIVACAINE HCL (PF) 0.5 % IJ SOLN
INTRAMUSCULAR | Status: AC
  Filled 2019-08-14: qty 30

## 2019-08-14 MED ORDER — MAGNESIUM CITRATE PO SOLN: 1.0000 | Freq: Once | ORAL | Status: DC | PRN

## 2019-08-14 MED ORDER — ONDANSETRON HCL 4 MG/2ML IJ SOLN: 4.0000 mg | Freq: Once | INTRAMUSCULAR | Status: DC | PRN

## 2019-08-14 MED ORDER — LEVOTHYROXINE SODIUM 125 MCG PO TABS
125.0000 ug | ORAL_TABLET | Freq: Every day | ORAL | Status: DC
  Administered 2019-08-15 – 2019-08-16 (×2): 125 ug via ORAL
  Filled 2019-08-14 (×2): qty 1

## 2019-08-14 MED ORDER — SODIUM CHLORIDE 0.9 % IV SOLN: INTRAVENOUS | Status: DC

## 2019-08-14 MED ORDER — TRANEXAMIC ACID-NACL 1000-0.7 MG/100ML-% IV SOLN
1000.0000 mg | Freq: Once | INTRAVENOUS | Status: AC
  Administered 2019-08-14: 1000 mg via INTRAVENOUS
  Filled 2019-08-14: qty 100

## 2019-08-14 MED ORDER — MENTHOL 3 MG MT LOZG: 1.0000 | LOZENGE | OROMUCOSAL | Status: DC | PRN

## 2019-08-14 MED ORDER — IRRISEPT - 450ML BOTTLE WITH 0.05% CHG IN STERILE WATER, USP 99.95% OPTIME
TOPICAL | Status: DC | PRN
  Administered 2019-08-14: 450 mL

## 2019-08-14 MED ORDER — HYDROMORPHONE HCL 1 MG/ML IJ SOLN
0.2500 mg | INTRAMUSCULAR | Status: DC | PRN
  Administered 2019-08-14: 0.5 mg via INTRAVENOUS

## 2019-08-14 MED ORDER — ACETAMINOPHEN 500 MG PO TABS
1000.0000 mg | ORAL_TABLET | Freq: Four times a day (QID) | ORAL | Status: AC
  Administered 2019-08-14 – 2019-08-15 (×4): 1000 mg via ORAL
  Filled 2019-08-14 (×4): qty 2

## 2019-08-14 MED ORDER — ACETAMINOPHEN 325 MG PO TABS: 325.0000 mg | ORAL_TABLET | Freq: Four times a day (QID) | ORAL | Status: DC | PRN

## 2019-08-14 MED ORDER — METOCLOPRAMIDE HCL 5 MG PO TABS: 5.0000 mg | ORAL_TABLET | Freq: Three times a day (TID) | ORAL | Status: DC | PRN

## 2019-08-14 MED ORDER — FENTANYL CITRATE (PF) 100 MCG/2ML IJ SOLN
INTRAMUSCULAR | Status: AC
  Filled 2019-08-14: qty 2

## 2019-08-14 MED ORDER — MEPERIDINE HCL 50 MG/ML IJ SOLN: 6.2500 mg | INTRAMUSCULAR | Status: DC | PRN

## 2019-08-14 MED ORDER — MIDAZOLAM HCL 5 MG/5ML IJ SOLN
INTRAMUSCULAR | Status: DC | PRN
  Administered 2019-08-14: 2 mg via INTRAVENOUS

## 2019-08-14 MED ORDER — PROPOFOL 10 MG/ML IV BOLUS
INTRAVENOUS | Status: AC
  Filled 2019-08-14: qty 20

## 2019-08-14 MED ORDER — FENTANYL CITRATE (PF) 100 MCG/2ML IJ SOLN
INTRAMUSCULAR | Status: DC | PRN
  Administered 2019-08-14 (×2): 50 ug via INTRAVENOUS

## 2019-08-14 SURGICAL SUPPLY — 49 items
ADH SKN CLS APL DERMABOND .7 (GAUZE/BANDAGES/DRESSINGS) ×1
BAG ZIPLOCK 12X15 (MISCELLANEOUS) ×2 IMPLANT
BLADE SAW SGTL 11.0X1.19X90.0M (BLADE) IMPLANT
BNDG ELASTIC 6X10 VLCR STRL LF (GAUZE/BANDAGES/DRESSINGS) ×2 IMPLANT
BNDG ELASTIC 6X5.8 VLCR STR LF (GAUZE/BANDAGES/DRESSINGS) ×2 IMPLANT
COVER SURGICAL LIGHT HANDLE (MISCELLANEOUS) ×2 IMPLANT
COVER WAND RF STERILE (DRAPES) ×2 IMPLANT
CUFF TOURN SGL QUICK 34 (TOURNIQUET CUFF) ×2
CUFF TRNQT CYL 34X4.125X (TOURNIQUET CUFF) ×1 IMPLANT
DERMABOND ADVANCED (GAUZE/BANDAGES/DRESSINGS) ×1
DERMABOND ADVANCED .7 DNX12 (GAUZE/BANDAGES/DRESSINGS) ×1 IMPLANT
DRAPE SHEET LG 3/4 BI-LAMINATE (DRAPES) ×2 IMPLANT
DRSG ADAPTIC 3X8 NADH LF (GAUZE/BANDAGES/DRESSINGS) IMPLANT
DRSG AQUACEL AG ADV 3.5X10 (GAUZE/BANDAGES/DRESSINGS) ×2 IMPLANT
DRSG AQUACEL AG ADV 3.5X14 (GAUZE/BANDAGES/DRESSINGS) ×2 IMPLANT
DRSG PAD ABDOMINAL 8X10 ST (GAUZE/BANDAGES/DRESSINGS) ×4 IMPLANT
DRSG TEGADERM 4X4.75 (GAUZE/BANDAGES/DRESSINGS) IMPLANT
DURAPREP 26ML APPLICATOR (WOUND CARE) ×4 IMPLANT
EVACUATOR 1/8 PVC DRAIN (DRAIN) IMPLANT
GAUZE SPONGE 2X2 8PLY STRL LF (GAUZE/BANDAGES/DRESSINGS) IMPLANT
GAUZE SPONGE 4X4 12PLY STRL (GAUZE/BANDAGES/DRESSINGS) ×2 IMPLANT
GAUZE XEROFORM 5X9 LF (GAUZE/BANDAGES/DRESSINGS) ×2 IMPLANT
GLOVE BIOGEL PI IND STRL 7.5 (GLOVE) ×1 IMPLANT
GLOVE BIOGEL PI IND STRL 8.5 (GLOVE) ×1 IMPLANT
GLOVE BIOGEL PI INDICATOR 7.5 (GLOVE) ×1
GLOVE BIOGEL PI INDICATOR 8.5 (GLOVE) ×1
GLOVE ECLIPSE 8.0 STRL XLNG CF (GLOVE) ×2 IMPLANT
GLOVE ORTHO TXT STRL SZ7.5 (GLOVE) ×4 IMPLANT
GOWN STRL REUS W/TWL 2XL LVL3 (GOWN DISPOSABLE) ×2 IMPLANT
GOWN STRL REUS W/TWL LRG LVL3 (GOWN DISPOSABLE) ×2 IMPLANT
HANDPIECE INTERPULSE COAX TIP (DISPOSABLE) ×2
KIT TURNOVER KIT A (KITS) IMPLANT
MANIFOLD NEPTUNE II (INSTRUMENTS) ×2 IMPLANT
PACK TOTAL KNEE CUSTOM (KITS) ×2 IMPLANT
PADDING CAST COTTON 6X4 STRL (CAST SUPPLIES) ×4 IMPLANT
PENCIL SMOKE EVACUATOR (MISCELLANEOUS) ×2 IMPLANT
PROTECTOR NERVE ULNAR (MISCELLANEOUS) ×2 IMPLANT
SET HNDPC FAN SPRY TIP SCT (DISPOSABLE) ×1 IMPLANT
SPONGE GAUZE 2X2 STER 10/PKG (GAUZE/BANDAGES/DRESSINGS)
STAPLER VISISTAT 35W (STAPLE) ×2 IMPLANT
SUT ETHILON 2 0 PS N (SUTURE) ×4 IMPLANT
SUT MNCRL AB 3-0 PS2 18 (SUTURE) ×2 IMPLANT
SUT MNCRL AB 4-0 PS2 18 (SUTURE) ×2 IMPLANT
SUT STRATAFIX PDS+ 0 24IN (SUTURE) ×2 IMPLANT
SUT VIC AB 1 CT1 36 (SUTURE) ×4 IMPLANT
SUT VIC AB 2-0 CT1 27 (SUTURE) ×10
SUT VIC AB 2-0 CT1 TAPERPNT 27 (SUTURE) ×5 IMPLANT
SWAB COLLECTION DEVICE MRSA (MISCELLANEOUS) ×4 IMPLANT
SWAB CULTURE ESWAB REG 1ML (MISCELLANEOUS) ×4 IMPLANT

## 2019-08-14 NOTE — Brief Op Note (Signed)
08/14/2019  4:55 PM  PATIENT:  [name]  69 y.o. male  PRE-OPERATIVE DIAGNOSIS:  Left knee persistant drainage in setting of chronic left knee infection  POST-OPERATIVE DIAGNOSIS:  Left knee persistant drainage in setting of chronic left knee infection  PROCEDURE:  Procedure(s) with comments: IRRIGATION AND DEBRIDEMENT LEFT KNEE WOUND (Left) - 60 mins  SURGEON:  Surgeon(s) and Role:    Paralee Cancel, MD - Primary  PHYSICIAN ASSISTANT: Griffith Citron, PA-C  ANESTHESIA:   general  EBL:  20 mL   BLOOD ADMINISTERED:none  DRAINS: none   LOCAL MEDICATIONS USED:  NONE  SPECIMEN:  Source of Specimen:  left knee synovial fluid  DISPOSITION OF SPECIMEN:  PATHOLOGY  COUNTS:  YES  TOURNIQUET:   Total Tourniquet Time Documented: Thigh (Right) - 52 minutes Total: Thigh (Right) - 52 minutes   DICTATION: .Other Dictation: Dictation Number [id number]  PLAN OF CARE: Admit to inpatient   PATIENT DISPOSITION:  PACU - hemodynamically stable.   Delay start of Pharmacological VTE agent (>24hrs) due to surgical blood loss or risk of bleeding: no

## 2019-08-14 NOTE — H&P (Signed)
TOTAL KNEE IRRIGATION AND DEBRIDEMENT ADMISSION H&P  Patient is being admitted for left  total knee arthroplasty irrigation and debridement.  Subjective:  Chief Complaint: Persistent left knee drainage, s/p left TKA revision/reimplantation  HPI: [name], 69 y.o. 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 He has had several revision arthroplasties, including partial resection with antibiotic spacers.  Most recently, he underwent left knee I&D and polyethylene exchange with wound vac placement on 05/17/19 by Dr. Alvan Dame. He has been on doxycycline 100 BID. He was seen in the clinic post-operatively and had slow healing with persistent drainage from the distal incision. Dr. Alvan Dame discussed treatment options with him, including non-surgical treatment with IV vs PO antibiotics, repeat I&D, and total resection arthroplasty. He reviewed risks & benefits, and the patient elected to proceed with repeat I&D. We will plan for repeat irrigation and debridement with possible polyethylene exchange and possible wound vac placement. He may have a new PICC line placed during hospitalization, with plans for home IV vs PO antibiotic therapy.  Patient Active Problem List   Diagnosis Date Noted  . S/P left TK revision 04/03/2019  . Administration of long-term prophylactic antibiotics   . History of streptococcal infection   . History of DVT (deep vein thrombosis)   . S/P rev left TK 12/15/2015  . 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 Preoperative cardiovascular examination 04/17/2013  . OSA on CPAP 11/27/2012  . Chronic diastolic heart failure ([place]) 09/10/2012  . Chronic anticoagulation, with Xarelto 09/10/2012  . DVT (deep venous thrombosis), possible 09/10/2012  . SOB (shortness of breath) 08/26/2012  . Chest discomfort 08/26/2012    . Persistent atrial fibrillation ([place]) 08/26/2012  . Super obesity 06/27/2012  . Expected blood loss anemia 06/27/2012  . S/P left TK revision 06/23/2012  . Septic arthritis of knee ([place])   . Cellulitis 09/19/2010  . METHICILLIN RESISTANT STAPHYLOCOCCUS AUREUS INFECTION 09/10/2009  . 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 DM 07/04/2008  . CHRONIC KIDNEY DISEASE UNSPECIFIED 07/04/2008  . PULMONARY EMBOLISM, HX OF 07/04/2008  . INFECTION DUE TO INTERNAL ORTH DEVICE NEC 03/02/2006   Past Medical History:  Diagnosis Date  . CHF (congestive heart failure) ([place]) 05/2019  . Chronic anticoagulation    with Xarelto  . Complication of anesthesia    PT STATES HARD TO WAKE UP AFTER ONE SUGERY -STATES THE SURGERY TOOK LONGER THAN EXPECTED.  NO PROBLEMS WITH ANY OTHER SURGERY  . Complication of anesthesia    in 12/2018-states started  shaking after surgery, could not get warm   . Dysrhythmia    A-fib  . GERD (gastroesophageal reflux disease)   . History of blood clots   . History of blood transfusion   . Hypothyroidism   . Pain    BACK PAIN - PT ATTRIBUTES TO THE WAY HE WALKS DUE TO LEFT KNEE PROBLEM  . Persistent atrial fibrillation ([place])   . Septic arthritis of knee ([facility])    LEFT KNEE  . Shortness of breath    WITH EXERTION AND PAIN  . Sleep apnea    uses CPAP,    Past Surgical History:  Procedure Laterality Date  . 2010 REMOVAL OF LEFT TOTAL KNEE    . CARDIAC CATHETERIZATION  2006  . CARDIOVERSION N/A 08/28/2012   Procedure: CARDIOVERSION;  Surgeon: Sanda Klein, MD;  Location: [facility];  Service: Cardiovascular;  Laterality: N/A;  . CARDIOVERSION N/A 09/01/2012   Procedure: CARDIOVERSION;  Surgeon: Pixie Casino, MD;  Location: [facility];  Service: Cardiovascular;  Laterality: N/A;  . COLONOSCOPY N/A 06/13/2013   Procedure: COLONOSCOPY;  Surgeon: Inda Castle, MD;  Location: [facility];  Service: Endoscopy;  Laterality: N/A;  . EXCISIONAL TOTAL KNEE  ARTHROPLASTY WITH ANTIBIOTIC SPACERS Left 09/21/2018   Procedure: Resection of tibia versus both components with placement of antibiotic spacer;  Surgeon: Paralee Cancel, MD;  Location: [facility];  Service: Orthopedics;  Laterality: Left;  2.5 hrs  . EXCISIONAL TOTAL KNEE ARTHROPLASTY WITH ANTIBIOTIC SPACERS Left 01/02/2019   Procedure: Repeat Washout and placement of antibiotic spacer left knee;  Surgeon: Paralee Cancel, MD;  Location: [facility];  Service: Orthopedics;  Laterality: Left;  2 hrs  . HYDROCELECTOY   2012  . I & D KNEE WITH POLY EXCHANGE Left 05/17/2019   Procedure: IRRIGATION AND DEBRIDEMENT KNEE WITH POLY EXCHANGE;  Surgeon: Paralee Cancel, MD;  Location: [facility];  Service: Orthopedics;  Laterality: Left;  90 mins  . IRRIGATION AND DEBRIDEMENT KNEE Left 04/03/2019   Procedure: REIMPLANTATION OF LEFT KNEE TIBIAL COMPONENT.;  Surgeon: Paralee Cancel, MD;  Location: [facility];  Service: Orthopedics;  Laterality: Left;  need 120 min  . LEFT KNEE ARTHROSCOPY  1999  . LEFT KNEE SURGERY UPPER TIBIAL OSTOMY    . LEFT TOTAL KNEE REMOVAL FOR INFECTION  2006  . REIMPLANTATION LEFT TOTAL KNEE  2008  . REIMPLANTATION LEFT TOTAL KNEE   2006  . REIMPLANTATION OF TOTAL KNEE Left 06/23/2012   Procedure: REIMPLANTATION OF LEFT TOTAL KNEE;  Surgeon: Mauri Pole, MD;  Location: [facility];  Service: Orthopedics;  Laterality: Left;  . REMOVAL LEFT TOTAL KNEE   2008  . REPLACEMENT LEFT KNEE  2002  . REPLACEMENT RIGHT KNEE  2003  . REVISION LEFT KNEE CAP   2004  . RIGHT KNEE ARTHROSCOPY  1998  . TEE WITHOUT CARDIOVERSION N/A 08/28/2012   Procedure: TRANSESOPHAGEAL ECHOCARDIOGRAM (TEE);  Surgeon: Sanda Klein, MD;  Location: [facility];  Service: Cardiovascular;  Laterality: N/A;  . TONSILLECTOMY    . TOTAL KNEE REVISION Left 12/15/2015   Procedure: TOTAL KNEE REVISION REPLACEMENT;  Surgeon: Paralee Cancel, MD;  Location: [facility];  Service: Orthopedics;  Laterality: Left;  Adductor Block    No current facility-administered  medications for this encounter.   Current Outpatient Medications  Medication Sig Dispense Refill Last Dose  . acetaminophen (TYLENOL) 500 MG tablet Take 2 tablets (1,000 mg total) by mouth every 8 (eight) hours. 30 tablet 0   . diltiazem (CARDIZEM CD) 240 MG 24 hr capsule TAKE 1 CAPSULE BY MOUTH DAILY (Patient taking differently: Take 240 mg by mouth daily. ) 90 capsule 3   . docusate sodium (COLACE) 100 MG capsule Take 1 capsule (100 mg total) by mouth 2 (two) times daily. 28 capsule 0   . doxycycline (VIBRAMYCIN) 100 MG capsule Take 100 mg by mouth 2 (two) times daily.     . ferrous sulfate (FERROUSUL) 325 (65 FE) MG tablet Take 1 tablet (325 mg total) by mouth 3 (three) times daily with meals for 14 days. 42 tablet 0   . levothyroxine (SYNTHROID) 125 MCG tablet Take 125 mcg by mouth daily before breakfast.      . methocarbamol (ROBAXIN) 500 MG tablet Take 1 tablet (500 mg total) by mouth every 6 (six) hours as needed for muscle spasms. 40 tablet  0   . metoprolol tartrate (LOPRESSOR) 50 MG tablet TAKE 1 TABLET BY MOUTH TWICE DAILY (Patient taking differently: Take 50 mg by mouth 2 (two) times daily. ) 180 tablet 2   . Multiple Vitamin (MULTIVITAMIN WITH MINERALS) TABS Take 1 tablet by mouth daily.     Marland Kitchen oxyCODONE (OXY IR/ROXICODONE) 5 MG immediate release tablet Take 1-2 tablets (5-10 mg total) by mouth every 4 (four) hours as needed for moderate pain or severe pain. 60 tablet 0   . polyethylene glycol (MIRALAX / GLYCOLAX) 17 g packet Take 17 g by mouth 2 (two) times daily. 28 packet 0   . rivaroxaban (XARELTO) 20 MG TABS tablet Take 1 tablet (20 mg total) by mouth daily with breakfast. 90 tablet 3    Allergies  Allergen Reactions  . Morphine Hives    Social History   Tobacco Use  . Smoking status: Never Smoker  . Smokeless tobacco: Never Used  Substance Use Topics  . Alcohol use: Not Currently    Comment: not since 1999    Family History  Problem Relation Age of Onset  . Heart  disease Father 50  . Pancreatic cancer Sister   . Liver cancer Sister   . Cervical cancer Sister   . Breast cancer Sister   . Diabetes Sister   . Colon cancer Neg Hx   . Throat cancer Neg Hx   . Stomach cancer Neg Hx   . Kidney disease Neg Hx   . Liver disease Neg Hx       Review of Systems  Constitutional: Negative for chills and fever.  Respiratory: Negative for cough and shortness of breath.   Gastrointestinal: Negative for nausea and vomiting.  Musculoskeletal: Positive for arthralgias.     Objective:  Physical Exam Patient is a 69 year old male.  Morbidly obese and well developed. General: Alert and oriented x3, cooperative and pleasant, no acute distress. Head: normocephalic, atraumatic, neck supple. Eyes: EOMI. Respiratory: breath sounds clear in all fields, no wheezing, rales, or rhonchi. Cardiovascular: Regular rate and rhythm, no murmurs, gallops or rubs. Abdomen: non-tender to palpation and soft, normoactive bowel sounds.  Musculoskeletal: Left Knee Exam: Left knee with large anterior knee scar. He has a small area about the distal incision which is open and drains some serosanguinous fluid. No erythema. Moderate swelling about the knee.  Calves soft and nontender. Motor function intact in LE. Strength 5/5 LE bilaterally. Neuro: Distal pulses 2+. Sensation to light touch intact in LE.  Vital signs in last 24 hours:    Labs:  Estimated body mass index is 60.58 kg/m as calculated from the following:   Height as of 08/09/19: 5\' 6"  (1.676 m).   Weight as of 08/09/19: 170.2 kg.  Imaging Review  Assessment/Plan:  Chronic prosthetic joint infection, left knee(s) with failed previous arthroplasty.   The patient history, physical examination, clinical judgment of the provider and imaging studies are consistent with chronic prosthetic joint infection left knee(s), previous total knee arthroplasty. Revision total knee arthroplasty is deemed medically necessary. The  treatment options including medical management, injection therapy, arthroscopy and revision arthroplasty were discussed at length. The risks and benefits of revision total knee arthroplasty were presented and reviewed. The risks due to aseptic loosening, infection, stiffness, patella tracking problems, thromboembolic complications and other imponderables were discussed. The patient acknowledged the explanation, agreed to proceed with the plan and consent was signed. Patient is being admitted for inpatient treatment for surgery, pain control, PT, OT, prophylactic antibiotics, VTE  prophylaxis, progressive ambulation and ADL's and discharge planning.The patient is planning to be discharged home.   Disposition: Home alone, possible HHRN Planned DVT Prophylaxis: Xarelto 20 mg daily PCP: Dr. Coletta Memos Cardiologist: Dr. Sallyanne Kuster TXA: IV Allergies: morphine - hives Anesthesia Concerns: none BMI: 58.5 Not diabetic.  Other: Hx of atrial fibrillation. Hx of blood clots. Oxycodone okay.  Griffith Citron, PA-C Orthopedic Surgery [facility] [place] [phone]

## 2019-08-14 NOTE — Anesthesia Preprocedure Evaluation (Addendum)
Anesthesia Evaluation  Patient identified by MRN, date of birth, ID band Patient awake    Reviewed: Allergy & Precautions, NPO status , Patient's Chart, lab work & pertinent test results  History of Anesthesia Complications (+) PROLONGED EMERGENCE and history of anesthetic complications  Airway Mallampati: II  TM Distance: >3 FB     Dental no notable dental hx. (+) Dental Advisory Given   Pulmonary sleep apnea and Continuous Positive Airway Pressure Ventilation , PE   Pulmonary exam normal        Cardiovascular hypertension, Pt. on medications and Pt. on home beta blockers +CHF  Normal cardiovascular exam+ dysrhythmias Atrial Fibrillation    '14 TTE - EF 50% to 55%. LA was severely dilated. RV was mildly dilated. RA was moderately dilated.   Pt cleared by cardiology 12/05/2018.  Per OV note, "Given past medical history and time since last visit, based on ACC/AHA guidelines,[name] be at acceptable risk for the planned procedure without further cardiovascular testing. Per pharmacy, patient with diagnosis ofatrial fibrillationon Xareltofor anticoagulationwith aCHADS2-VASc score of4(CHF, AGE, stroke/tia x 2/DVT/PE)andCrCl127 ml/min Per office protocol, patient can holdXareltofor 3days prior to procedureand resume when safe per surgical team. Case reviewed by Dr. Sallyanne Kuster during pre-operative evaluation."    Neuro/Psych  Headaches, negative psych ROS   GI/Hepatic Neg liver ROS, GERD  Controlled,  Endo/Other  diabetesHypothyroidism Morbid obesity  Renal/GU CRFRenal disease     Musculoskeletal  (+) Arthritis ,  Chronic back pain    Abdominal   Peds  Hematology  (+) Blood dyscrasia, anemia ,      Anesthesia Other Findings Covid negative 11/20   Reproductive/Obstetrics                             Anesthesia Physical  Anesthesia Plan  ASA: III  Anesthesia  Plan: General   Post-op Pain Management:    Induction:   PONV Risk Score and Plan: 1 and Treatment may vary due to age or medical condition, Propofol infusion and Ondansetron  Airway Management Planned: Oral ETT and LMA  Additional Equipment:   Intra-op Plan:   Post-operative Plan: Extubation in OR  Informed Consent: I have reviewed the patients History and Physical, chart, labs and discussed the procedure including the risks, benefits and alternatives for the proposed anesthesia with the patient or authorized representative who has indicated his/her understanding and acceptance.     Dental advisory given  Plan Discussed with: CRNA, Anesthesiologist and Surgeon  Anesthesia Plan Comments: ( )        Anesthesia Quick Evaluation

## 2019-08-14 NOTE — Plan of Care (Signed)
Plan of care 

## 2019-08-14 NOTE — Interval H&P Note (Signed)
History and Physical Interval Note:  08/14/2019 12:40 PM  [name]  has presented today for surgery, with the diagnosis of Left knee persistant drainage.  The various methods of treatment have been discussed with the patient and family. After consideration of risks, benefits and other options for treatment, the patient has consented to  Procedure(s) with comments: IRRIGATION AND DEBRIDEMENT LEFT KNEE WOUND (Left) - 60 mins as a surgical intervention.  The patient's history has been reviewed, patient examined, no change in status, stable for surgery.  I have reviewed the patient's chart and labs.  Questions were answered to the patient's satisfaction.     Mauri Pole

## 2019-08-14 NOTE — Op Note (Signed)
NAME: JOHANNES, EVERAGE MEDICAL RECORD SA:[id number] ACCOUNT [id number] DATE OF BIRTH:1950/05/01 FACILITY: [facility] LOCATION: [facility] PHYSICIAN:Oseph Imburgia D. 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  PROCEDURE:  Open excision and non-excisional debridement of left knee with primary wound closure.  We excised approximately 8 inches of his incision, including skin and nonviable tissue.  I then excised sharply nonviable tissue inside the knee.   Following this, we did a non-excisional debridement with 3 L normal saline solution and 450 mL of Irrisept, chlorhexidine fluid, which remained in the wound for 5 minutes.  SURGEON:  Paralee Cancel, MD  ASSISTANT:  Griffith Citron, PA-C  Note that Ms. [name] was present for the entirety of the case from preoperative positioning, perioperative management of the operative extremity, general facilitation of the case and primary wound closure.  ANESTHESIA:  General.  SPECIMENS:  Synovial fluid sent from the knee was sent to pathology for evaluation, Gram stain culture to determine if there is any resistance formed.  DRAINS:  None.  TOURNIQUET TIME:  52 minutes at 300 mmHg.  INDICATIONS:  The patient is a 69 year old male with multiple surgical procedure performed on his left knee.  I have taken him to the operating room most recently in 05/2019 to try to close his wound with application of wound VAC.  He has had persistent  drainage in a 1-2 mm area in the inferior aspect of the incision that I was unable to get to seal.  He has been on chronic suppressive oral  antibiotics following a second course of IV vancomycin.  Due to the persistence of the drainage, I felt it was  indicated to go to the operating room to try to wash the knee out and get this wound to heal.  The risks of recurrent and persistent infection discussed and reviewed.  Consent was obtained for management of this wound.  PROCEDURE IN DETAIL:  The patient was brought to the operative theater.  Once adequate anesthesia, preoperative antibiotics, Ancef administered, he was positioned supine with a left thigh tourniquet placed.  The left lower extremity was prepped and  draped in sterile fashion.  A timeout was performed, identifying the patient, the planned procedure, and extremity.  At this point, the leg was exsanguinated and tourniquet elevated to 300 mmHg.  I excised a portion of the incision to enter to above the  patella, excising skin down through the area of the inferior aspect of the wound where there was this persistent drainage.  I then created soft tissue planes and encountering in this area bursal fluid or old seroma fluid.  I then opened the joint with  the intention of washing the joint out no matter without exchange of polyethylene.  When we opened the joint, there was synovial fluid present that had an inflammatory appearance, but no obvious purulence.  For this reason, I did send fluid off, about 8  mL in a syringe, as well as culture swabs for evaluation for Gram stain and culture.  Once this was done, we sharply excise nonviable tissue throughout the knee.  Once I had the knee adequately debrided, we irrigated the knee with 3  L of normal saline  solution.  Once this was done, we used 450 mL of the IrriSept and left it in the knee wound for 5 minutes.  Once this was done, we reapproximated the extensor mechanism with #1 Vicryl and then supported this with a #1 Stratafix suture.  The remainder of  the wound was closed with 2-0 Vicryl.  At the distal aspect where we had had areas of  concern, I used 2-0 nylon to try to evert the skin edges better and then we used staples on the remaining portion of the incision that was opened.  Once this was done,  we clean and dried the wound and dressed it with Xeroform and a bulky sterile dressing.  Once this was placed, he was awoken from anesthesia, extubated and brought to the recovery room in stable condition.  We will keep him in the hospital for 1-2 days  for IV antibiotics, trying to identify cultures and determine oral antibiotics for suppression.  At this point, the goal is suppression to prevent sepsis or other complications as the morbidity associated with any further surgical procedures is quite  extensive.  VN/NUANCE  D:08/14/2019 T:08/14/2019 JOB:[id number]

## 2019-08-14 NOTE — Anesthesia Postprocedure Evaluation (Signed)
Anesthesia Post Note  Patient: [name]  Procedure(s) Performed: IRRIGATION AND DEBRIDEMENT LEFT KNEE WOUND (Left )     Patient location during evaluation: PACU Anesthesia Type: General Level of consciousness: awake and alert Pain management: pain level controlled Vital Signs Assessment: post-procedure vital signs reviewed and stable Respiratory status: spontaneous breathing, nonlabored ventilation, respiratory function stable and patient connected to nasal cannula oxygen Cardiovascular status: blood pressure returned to baseline and stable Postop Assessment: no apparent nausea or vomiting Anesthetic complications: no   No complications documented.  Last Vitals:  Vitals:   08/14/19 1734 08/14/19 1855  BP: (!) 144/87 109/86  Pulse: 75 87  Resp: 20 17  Temp: 36.6 C 36.8 C  SpO2: 95% 94%    Last Pain:  Vitals:   08/14/19 1855  TempSrc: Oral  PainSc:                  [name]

## 2019-08-14 NOTE — Anesthesia Procedure Notes (Signed)
Procedure Name: LMA Insertion Date/Time: 08/14/2019 2:46 PM Performed by: Glory Buff, CRNA Pre-anesthesia Checklist: Patient identified, Emergency Drugs available, Suction available and Patient being monitored Patient Re-evaluated:Patient Re-evaluated prior to induction Oxygen Delivery Method: Circle system utilized Preoxygenation: Pre-oxygenation with 100% oxygen Induction Type: IV induction LMA: LMA inserted LMA Size: 5.0 Number of attempts: 1 Placement Confirmation: positive ETCO2 Tube secured with: Tape Dental Injury: Teeth and Oropharynx as per pre-operative assessment

## 2019-08-14 NOTE — Progress Notes (Signed)
Pharmacy Antibiotic Note  [name] is a 69 y.o. male admitted on 08/14/2019 with wound infection.  Pharmacy has been consulted for Vancomycin dosing. Hx chronic L knee infection, I&D 7/6. Multiple revision surgeries, latest I&D with polyethylene exchange/Wound Vac 05/17/19 treated with Doxycycline as outpt.  Plan: Vancomycin 2500mg  x1, then 1250mg  q12  Height: 5\' 6"  (167.6 cm) Weight: (!) 170.2 kg (375 lb 5 oz) IBW/kg (Calculated) : 63.8  Temp (24hrs), Avg:97.8 F (36.6 C), Min:97.4 F (36.3 C), Max:98 F (36.7 C)  Recent Labs  Lab 08/09/19 0843  WBC 4.3  CREATININE 1.22    Estimated Creatinine Clearance: 87.2 mL/min (by C-G formula based on SCr of 1.22 mg/dL).    Allergies  Allergen Reactions  . Morphine Hives    Antimicrobials this admission: 7/6 Vancomycin >>   Dose adjustments this admission:  Microbiology results: 7/6 Wound Cx: sent   Thank you for allowing pharmacy to be a part of this patient's care.  Minda Ditto PharmD 08/14/2019 6:08 PM

## 2019-08-14 NOTE — Transfer of Care (Signed)
Immediate Anesthesia Transfer of Care Note  Patient: [name]  Procedure(s) Performed: IRRIGATION AND DEBRIDEMENT LEFT KNEE WOUND (Left )  Patient Location: PACU  Anesthesia Type:General  Level of Consciousness: drowsy, patient cooperative and responds to stimulation  Airway & Oxygen Therapy: Patient Spontanous Breathing and Patient connected to face mask oxygen  Post-op Assessment: Report given to RN and Post -op Vital signs reviewed and stable  Post vital signs: Reviewed and stable  Last Vitals:  Vitals Value Taken Time  BP    Temp    Pulse 82 08/14/19 1610  Resp 17 08/14/19 1610  SpO2 97 % 08/14/19 1610  Vitals shown include unvalidated device data.  Last Pain:  Vitals:   08/14/19 1326  TempSrc:   PainSc: 1       Patients Stated Pain Goal: 4 (44/36/01 6580)  Complications: No complications documented.

## 2019-08-15 ENCOUNTER — Encounter (HOSPITAL_COMMUNITY): Payer: Self-pay | Admitting: Orthopedic Surgery

## 2019-08-15 DIAGNOSIS — Z79891 Long term (current) use of opiate analgesic: Secondary | ICD-10-CM | POA: Diagnosis not present

## 2019-08-15 DIAGNOSIS — Z79890 Hormone replacement therapy (postmenopausal): Secondary | ICD-10-CM | POA: Diagnosis not present

## 2019-08-15 DIAGNOSIS — T8459XA Infection and inflammatory reaction due to other internal joint prosthesis, initial encounter: Secondary | ICD-10-CM

## 2019-08-15 DIAGNOSIS — Z96651 Presence of right artificial knee joint: Secondary | ICD-10-CM | POA: Diagnosis not present

## 2019-08-15 DIAGNOSIS — I110 Hypertensive heart disease with heart failure: Secondary | ICD-10-CM | POA: Diagnosis not present

## 2019-08-15 DIAGNOSIS — G4733 Obstructive sleep apnea (adult) (pediatric): Secondary | ICD-10-CM | POA: Diagnosis not present

## 2019-08-15 DIAGNOSIS — Z7901 Long term (current) use of anticoagulants: Secondary | ICD-10-CM | POA: Diagnosis not present

## 2019-08-15 DIAGNOSIS — I4819 Other persistent atrial fibrillation: Secondary | ICD-10-CM | POA: Diagnosis not present

## 2019-08-15 DIAGNOSIS — Z79899 Other long term (current) drug therapy: Secondary | ICD-10-CM | POA: Diagnosis not present

## 2019-08-15 DIAGNOSIS — Y831 Surgical operation with implant of artificial internal device as the cause of abnormal reaction of the patient, or of later complication, without mention of misadventure at the time of the procedure: Secondary | ICD-10-CM | POA: Diagnosis present

## 2019-08-15 DIAGNOSIS — Z8614 Personal history of Methicillin resistant Staphylococcus aureus infection: Secondary | ICD-10-CM | POA: Diagnosis not present

## 2019-08-15 DIAGNOSIS — Z86711 Personal history of pulmonary embolism: Secondary | ICD-10-CM | POA: Diagnosis not present

## 2019-08-15 DIAGNOSIS — Z888 Allergy status to other drugs, medicaments and biological substances status: Secondary | ICD-10-CM | POA: Diagnosis not present

## 2019-08-15 DIAGNOSIS — K219 Gastro-esophageal reflux disease without esophagitis: Secondary | ICD-10-CM | POA: Diagnosis not present

## 2019-08-15 DIAGNOSIS — Z86718 Personal history of other venous thrombosis and embolism: Secondary | ICD-10-CM | POA: Diagnosis not present

## 2019-08-15 DIAGNOSIS — T8454XA Infection and inflammatory reaction due to internal left knee prosthesis, initial encounter: Secondary | ICD-10-CM | POA: Diagnosis not present

## 2019-08-15 DIAGNOSIS — I5032 Chronic diastolic (congestive) heart failure: Secondary | ICD-10-CM | POA: Diagnosis not present

## 2019-08-15 DIAGNOSIS — Z96659 Presence of unspecified artificial knee joint: Secondary | ICD-10-CM

## 2019-08-15 DIAGNOSIS — Z792 Long term (current) use of antibiotics: Secondary | ICD-10-CM | POA: Diagnosis not present

## 2019-08-15 DIAGNOSIS — Z6841 Body Mass Index (BMI) 40.0 and over, adult: Secondary | ICD-10-CM | POA: Diagnosis not present

## 2019-08-15 DIAGNOSIS — E039 Hypothyroidism, unspecified: Secondary | ICD-10-CM | POA: Diagnosis not present

## 2019-08-15 LAB — BASIC METABOLIC PANEL
Anion gap: 8 (ref 5–15)
BUN: 22 mg/dL (ref 8–23)
CO2: 24 mmol/L (ref 22–32)
Calcium: 9.3 mg/dL (ref 8.9–10.3)
Chloride: 105 mmol/L (ref 98–111)
Creatinine, Ser: 1.14 mg/dL (ref 0.61–1.24)
GFR calc Af Amer: >60 mL/min (ref >60)
GFR calc non Af Amer: >60 mL/min (ref >60)
Glucose, Bld: 155 mg/dL — ABNORMAL HIGH (ref 70–99)
Potassium: 5 mmol/L (ref 3.5–5.1)
Sodium: 137 mmol/L (ref 135–145)

## 2019-08-15 LAB — CBC
HCT: 34.6 % — ABNORMAL LOW (ref 39.0–52.0)
Hemoglobin: 10.5 g/dL — ABNORMAL LOW (ref 13.0–17.0)
MCH: 29.7 pg (ref 26.0–34.0)
MCHC: 30.3 g/dL (ref 30.0–36.0)
MCV: 98 fL (ref 80.0–100.0)
Platelets: 192 10*3/uL (ref 150–400)
RBC: 3.53 MIL/uL — ABNORMAL LOW (ref 4.22–5.81)
RDW: 14.5 % (ref 11.5–15.5)
WBC: 3.8 10*3/uL — ABNORMAL LOW (ref 4.0–10.5)
nRBC: 0 % (ref 0.0–0.2)

## 2019-08-15 NOTE — Evaluation (Signed)
Physical Therapy Evaluation Patient Details Name: [name] MRN: [id number] DOB: Apr 17, 1950 Today's Date: 08/15/2019   History of Present Illness  69yo  male adm with slow healing with persistent drainage from the distal incision, s/p L knee I and D on 08/14/19 per Dr Alvan Dame.   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 He has had several revision arthroplasties, including partial resection with antibiotic spacers. Most recently, he underwent left knee I&D and polyethylene exchange with wound vac placement on 05/17/19 by Dr. Alvan Dame.  Clinical Impression  Pt admitted with above diagnosis.  Pt well known to me from previous admissions, he is very pleasant and cooperative, motivated to mobilize. Anticipate pt will make steady progress in acute setting. Continue to follow  Pt currently with functional limitations due to the deficits listed below (see PT Problem List). Pt will benefit from skilled PT to increase their independence and safety with mobility to allow discharge to the venue listed below.       Follow Up Recommendations Follow surgeon's recommendation for DC plan and follow-up therapies    Equipment Recommendations  None recommended by PT    Recommendations for Other Services       Precautions / Restrictions Precautions Precautions: Fall;Knee Precaution Comments: no ROM restrictions however we discussed avoiding excessive flexion/stress across incision Restrictions Weight Bearing Restrictions: Yes LLE Weight Bearing: Partial weight bearing LLE Partial Weight Bearing Percentage or Pounds: 50%      Mobility  Bed Mobility Overal bed mobility: Needs Assistance Bed Mobility: Supine to Sit     Supine to sit: Supervision;HOB elevated     General bed mobility comments: for  safety, incr time, use of rail  Transfers Overall transfer level: Needs assistance Equipment used: Rolling walker (2 wheeled) Transfers: Sit to/from Stand Sit to Stand: Min guard         General transfer comment: for safety  Ambulation/Gait Ambulation/Gait assistance: Min guard Gait Distance (Feet): 90 Feet Assistive device: Rolling walker (2 wheeled) Gait Pattern/deviations: Step-to pattern     General Gait Details: initial cues for sequence and PWB  Stairs            Wheelchair Mobility    Modified Rankin (Stroke Patients Only)       Balance                                             Pertinent Vitals/Pain Pain Assessment: 0-10 Pain Score: 4  Pain Location: L knee with activity Pain Descriptors / Indicators: Discomfort;Sore Pain Intervention(s): Premedicated before session;Monitored during session;Limited activity within patient's tolerance    Home Living Family/patient expects to be discharged to:: Private residence Living Arrangements: Alone   Type of Home: House Home Access: Ramped entrance       Home Equipment: Environmental consultant - 2 wheels;Walker - 4 wheels;Cane - single point      Prior Function Level of Independence: Independent with assistive device(s)         Comments: Pt using RW at home prior to this knee surgery     Hand Dominance        Extremity/Trunk Assessment   Upper Extremity Assessment Upper Extremity Assessment: Overall WFL for tasks assessed    Lower  Extremity Assessment Lower Extremity Assessment: LLE deficits/detail LLE Deficits / Details: grossly 3/5 L knee. flexion not fully tested d/t precarious closure, delayed healing. pt flexes actively to ~ 85 degrees sittign EOB       Communication   Communication: No difficulties  Cognition Arousal/Alertness: Awake/alert Behavior During Therapy: WFL for tasks assessed/performed Overall Cognitive Status: Within Functional Limits for tasks assessed                                         General Comments      Exercises Total Joint Exercises Ankle Circles/Pumps: AROM;Both;10 reps   Assessment/Plan    PT Assessment Patient needs continued PT services  PT Problem List Decreased strength;Decreased range of motion;Decreased activity tolerance;Decreased balance;Decreased knowledge of use of DME;Decreased mobility;Pain       PT Treatment Interventions DME instruction;Gait training;Functional mobility training;Therapeutic activities;Patient/family education;Therapeutic exercise    PT Goals (Current goals can be found in the Care Plan section)  Acute Rehab PT Goals Patient Stated Goal: knee healed PT Goal Formulation: With patient Time For Goal Achievement: 08/22/19 Potential to Achieve Goals: Good    Frequency 7X/week   Barriers to discharge        Co-evaluation               AM-PAC PT "6 Clicks" Mobility  Outcome Measure Help needed turning from your back to your side while in a flat bed without using bedrails?: A Little Help needed moving from lying on your back to sitting on the side of a flat bed without using bedrails?: A Little Help needed moving to and from a bed to a chair (including a wheelchair)?: A Little Help needed standing up from a chair using your arms (e.g., wheelchair or bedside chair)?: A Little Help needed to walk in hospital room?: A Little Help needed climbing 3-5 steps with a railing? : A Lot 6 Click Score: 17    End of Session Equipment Utilized During Treatment: Gait belt Activity Tolerance: Patient tolerated treatment well Patient left: in bed;with call bell/phone within reach;with bed alarm set   PT Visit Diagnosis: Difficulty in walking, not elsewhere classified (R26.2)    Time: 9381-0175 PT Time Calculation (min) (ACUTE ONLY): 24 min   Charges:   PT Evaluation $PT Eval Low Complexity: 1 Low PT Treatments $Gait Training: 8-22 mins        Baxter Flattery, PT  Acute Rehab  Dept ([place]) [phone] Pager [phone]  08/15/2019   [facility] 08/15/2019, 1:02 PM

## 2019-08-15 NOTE — Progress Notes (Signed)
     Subjective: 1 Day Post-Op Procedure(s) (LRB): IRRIGATION AND DEBRIDEMENT LEFT KNEE WOUND (Left)   Patient reports pain as mild, pain controlled with medications.  No reported events throughout the night.  Dr. Alvan Dame discussed the procedure, findings and expectations moving forward.  Long discussion between the patient and Dr. Alvan Dame was had about how to proceed.  Topics of discussion included, but not limited to allowing the area to heal and treat with oral antibiotics, amputation, possibility of other surgeries, possibility of something happening to the bone and causing other injuries and more.  At this he will stay today and we will check on him and the results of the cultures tomorrow. Plan to not discharge the patient today to underlying medical co-morbidities, review labs/cultures, pain control and need for inpatient therapy to meet goal of being discharged home safely with family/caregiver.   Past Medical History:  Diagnosis Date  . CHF (congestive heart failure) ([place]) 05/2019  . Chronic anticoagulation    with Xarelto  . Complication of anesthesia    PT STATES HARD TO WAKE UP AFTER ONE SUGERY -STATES THE SURGERY TOOK LONGER THAN EXPECTED.  NO PROBLEMS WITH ANY OTHER SURGERY  . Complication of anesthesia    in 12/2018-states started  shaking after surgery, could not get warm   . Dysrhythmia    A-fib  . GERD (gastroesophageal reflux disease)   . History of blood clots   . History of blood transfusion   . Hypothyroidism   . Pain    BACK PAIN - PT ATTRIBUTES TO THE WAY HE WALKS DUE TO LEFT KNEE PROBLEM  . Persistent atrial fibrillation ([place])   . Septic arthritis of knee ([facility])    LEFT KNEE  . Shortness of breath    WITH EXERTION AND PAIN  . Sleep apnea    uses CPAP,      Objective:   VITALS:   Vitals:   08/15/19 0202 08/15/19 0510  BP: 123/81 100/77  Pulse: 73 60  Resp: 19 17  Temp: 97.7 F (36.5 C) (!) 97.5 F (36.4 C)  SpO2: 96% 97%    Dorsiflexion/Plantar  flexion intact Incision: dressing C/D/I No cellulitis present Compartment soft  LABS Recent Labs    08/15/19 0301  HGB 10.5*  HCT 34.6*  WBC 3.8*  PLT 192    Recent Labs    08/15/19 0301  NA 137  K 5.0  BUN 22  CREATININE 1.14  GLUCOSE 155*     Assessment/Plan: 1 Day Post-Op Procedure(s) (LRB): IRRIGATION AND DEBRIDEMENT LEFT KNEE WOUND (Left) Foley cath d/c'ed Advance diet Up with therapy D/C IV fluids Discharge home eventually when ready   Morbid Obesity (BMI >40)  Estimated body mass index is 60.58 kg/m as calculated from the following:   Height as of this encounter: 5\' 6"  (1.676 m).   Weight as of this encounter: 170.2 kg. Patient also counseled that weight may inhibit the healing process Patient counseled that losing weight will help with future health issues      Danae Orleans PA-C  [facility]  [place] [address], [address], [place], [place] [address] Phone: [phone] [email or web address] Facebook  [employer]

## 2019-08-16 LAB — CBC
HCT: 33.6 % — ABNORMAL LOW (ref 39.0–52.0)
Hemoglobin: 10.3 g/dL — ABNORMAL LOW (ref 13.0–17.0)
MCH: 30 pg (ref 26.0–34.0)
MCHC: 30.7 g/dL (ref 30.0–36.0)
MCV: 98 fL (ref 80.0–100.0)
Platelets: 189 10*3/uL (ref 150–400)
RBC: 3.43 MIL/uL — ABNORMAL LOW (ref 4.22–5.81)
RDW: 14.3 % (ref 11.5–15.5)
WBC: 7.1 10*3/uL (ref 4.0–10.5)
nRBC: 0 % (ref 0.0–0.2)

## 2019-08-16 LAB — BASIC METABOLIC PANEL
Anion gap: 7 (ref 5–15)
BUN: 23 mg/dL (ref 8–23)
CO2: 26 mmol/L (ref 22–32)
Calcium: 9.6 mg/dL (ref 8.9–10.3)
Chloride: 105 mmol/L (ref 98–111)
Creatinine, Ser: 1.05 mg/dL (ref 0.61–1.24)
GFR calc Af Amer: >60 mL/min (ref >60)
GFR calc non Af Amer: >60 mL/min (ref >60)
Glucose, Bld: 147 mg/dL — ABNORMAL HIGH (ref 70–99)
Potassium: 4.9 mmol/L (ref 3.5–5.1)
Sodium: 138 mmol/L (ref 135–145)

## 2019-08-16 MED ORDER — FERROUS SULFATE 325 (65 FE) MG PO TABS: 325.0000 mg | ORAL_TABLET | Freq: Three times a day (TID) | ORAL | 0 refills | Status: DC

## 2019-08-16 MED ORDER — OXYCODONE HCL 5 MG PO TABS: 5.0000 mg | ORAL_TABLET | ORAL | 0 refills | Status: DC | PRN

## 2019-08-16 MED ORDER — DOXYCYCLINE HYCLATE 100 MG PO CAPS: 100.0000 mg | ORAL_CAPSULE | Freq: Two times a day (BID) | ORAL | 3 refills | Status: DC

## 2019-08-16 MED ORDER — ACETAMINOPHEN 500 MG PO TABS
1000.0000 mg | ORAL_TABLET | Freq: Three times a day (TID) | ORAL | 0 refills | Status: DC
Start: 2019-08-16 — End: 2019-11-19

## 2019-08-16 MED ORDER — DOCUSATE SODIUM 100 MG PO CAPS
100.0000 mg | ORAL_CAPSULE | Freq: Two times a day (BID) | ORAL | 0 refills | Status: DC
Start: 2019-08-16 — End: 2019-09-20

## 2019-08-16 MED ORDER — POLYETHYLENE GLYCOL 3350 17 G PO PACK
17.0000 g | PACK | Freq: Two times a day (BID) | ORAL | 0 refills | Status: DC
Start: 2019-08-16 — End: 2019-09-20

## 2019-08-16 MED ORDER — METHOCARBAMOL 500 MG PO TABS: 500.0000 mg | ORAL_TABLET | Freq: Four times a day (QID) | ORAL | 0 refills | Status: DC | PRN

## 2019-08-16 NOTE — Plan of Care (Signed)
  Problem: Health Behavior/Discharge Planning: Goal: Ability to manage health-related needs will improve Outcome: Progressing   Problem: Clinical Measurements: Goal: Ability to maintain clinical measurements within normal limits will improve Outcome: Progressing Goal: Will remain free from infection Outcome: Progressing Goal: Diagnostic test results will improve Outcome: Progressing   Problem: Activity: Goal: Risk for activity intolerance will decrease Outcome: Progressing   Problem: Elimination: Goal: Will not experience complications related to bowel motility Outcome: Progressing   Problem: Pain Managment: Goal: General experience of comfort will improve Outcome: Progressing   Problem: Education: Goal: Knowledge of the prescribed therapeutic regimen will improve Outcome: Progressing   Problem: Activity: Goal: Ability to avoid complications of mobility impairment will improve Outcome: Progressing Goal: Range of joint motion will improve Outcome: Progressing   Problem: Clinical Measurements: Goal: Postoperative complications will be avoided or minimized Outcome: Progressing   Problem: Pain Management: Goal: Pain level will decrease with appropriate interventions Outcome: Progressing   Problem: Skin Integrity: Goal: Will show signs of wound healing Outcome: Progressing

## 2019-08-16 NOTE — Progress Notes (Signed)
Patient discharged to home w/ family. Given all belongings, instructions. Verbalized understanding of instructions. Escorted to pov via w/c. 

## 2019-08-16 NOTE — Progress Notes (Addendum)
Patient ID: [name], male   DOB: 1950-03-14, 69 y.o.   MRN: [id number] Subjective: 2 Days Post-Op Procedure(s) (LRB): IRRIGATION AND DEBRIDEMENT LEFT KNEE WOUND (Left)    Patient reports pain as mild.  Got a little better sleep last night, no events.  Objective:   VITALS:   Vitals:   08/15/19 2141 08/16/19 0456  BP: 127/75 (!) 138/93  Pulse: 67 72  Resp: 16 16  Temp: 98.2 F (36.8 C) 97.9 F (36.6 C)  SpO2: 91% 95%    Neurovascular intact Incision: His incision looked good this am, operative dresssing removed, no sign of drainage, staple and suture line intact LABS Recent Labs    08/15/19 0301 08/16/19 0236  HGB 10.5* 10.3*  HCT 34.6* 33.6*  WBC 3.8* 7.1  PLT 192 189    Recent Labs    08/15/19 0301 08/16/19 0236  NA 137 138  K 5.0 4.9  BUN 22 23  CREATININE 1.14 1.05  GLUCOSE 155* 147*    No results for input(s): LABPT, INR in the last 72 hours.   Assessment/Plan: 2 Days Post-Op Procedure(s) (LRB): IRRIGATION AND DEBRIDEMENT LEFT KNEE WOUND (Left)   Up with therapy Continue ABX therapy due to chronic left total knee infection RTC 7/14 for wound check Limited weight bearing  Historical medical issues:  Congestive heart failure non clinical significant for this admission Atrial fibrillation not clinically relevant for this admission - heart rate and rhythm normal throughout hospital stay

## 2019-08-16 NOTE — Discharge Summary (Signed)
Patient ID: [name] MRN: [id number] DOB/AGE: 1950-03-10 69 y.o.  Admit date: 08/14/2019 Discharge date: 08/16/2019  Admission Diagnoses:  Active Problems:   Open knee wound   Infection of total knee replacement [facility])   Discharge Diagnoses:  Same  Past Medical History:  Diagnosis Date   CHF (congestive heart failure) ([place]) 05/2019   Chronic anticoagulation    with Xarelto   Complication of anesthesia    PT STATES HARD TO WAKE UP AFTER ONE SUGERY -[place].  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   Shortness of breath    WITH EXERTION AND PAIN   Sleep apnea    uses CPAP,    Surgeries: Procedure(s): IRRIGATION AND DEBRIDEMENT LEFT KNEE WOUND on 08/14/2019   Consultants: N/A  Discharged Condition: Improved  Hospital Course: [name] is an 69 y.o. male who was admitted 08/14/2019 for operative treatment ofChronic total knee infection with drainage. Patient had persistent drainage and wound was not healing.  After pre-op clearance the patient was taken to the operating room on 08/14/2019 and underwent  Procedure(s): IRRIGATION AND DEBRIDEMENT LEFT KNEE WOUND.    Patient was given perioperative antibiotics:  Anti-infectives (From admission, onward)   Start     Dose/Rate Route Frequency Ordered Stop   08/16/19 0000  doxycycline (VIBRAMYCIN) 100 MG capsule     Discontinue     100 mg Oral 2 times daily 08/16/19 1247     08/15/19 0800  vancomycin (VANCOREADY) IVPB 1250 mg/250 mL     Discontinue     1,250 mg 166.7  mL/hr over 90 Minutes Intravenous Every 12 hours 08/14/19 1840     08/14/19 1930  vancomycin (VANCOCIN) 2,500 mg in sodium chloride 0.9 % 500 mL IVPB        2,500 mg 250 mL/hr over 120 Minutes Intravenous  Once 08/14/19 1836 08/14/19 2221   08/14/19 1300  ceFAZolin (ANCEF) 3 g in dextrose 5 % 50 mL IVPB        3 g 100 mL/hr over 30 Minutes Intravenous On call to O.R. 08/14/19 1252 08/14/19 1517       Patient was given sequential compression devices, early ambulation, and chemoprophylaxis to prevent DVT.  Patient benefited maximally from hospital stay and there were no complications.    Recent vital signs:  Patient Vitals for the past 24 hrs:  BP Temp Temp src Pulse Resp SpO2  08/16/19 0456 (!) 138/93 97.9 F (36.6 C) Oral 72 16 95 %  08/15/19 2141 127/75 98.2 F (36.8 C) Oral 67 16 91 %  08/15/19 1725 104/72 97.8 F (36.6 C) -- 65 18 92 %  08/15/19 1355 109/68 97.9 F (36.6 C) -- 60 18 97 %     Recent laboratory studies:  Recent Labs    08/15/19 0301 08/15/19 0301 08/16/19 0236  WBC 3.8*  --  7.1  HGB 10.5*  --  10.3*  HCT 34.6*  --  33.6*  PLT 192  --  189  NA 137  --  138  K 5.0  --  4.9  CL 105  --  105  CO2 24  --  26  BUN 22  --  23  CREATININE 1.14  --  1.05  GLUCOSE 155*  --  147*  CALCIUM 9.3   < > 9.6   < > = values in this interval not displayed.     Discharge Medications:   Allergies as of 08/16/2019      Reactions   Morphine Hives      Medication List    TAKE these medications   acetaminophen 500 MG tablet Commonly known as: TYLENOL Take 2 tablets (1,000 mg total) by mouth every 8 (eight) hours.   diltiazem 240 MG 24 hr capsule Commonly known as: CARDIZEM CD TAKE 1 CAPSULE BY MOUTH DAILY   docusate sodium 100 MG capsule Commonly known as: Colace Take 1 capsule (100 mg total) by mouth 2 (two) times daily.   doxycycline 100 MG capsule Commonly known as: VIBRAMYCIN Take 1 capsule (100 mg total) by mouth 2 (two) times daily.   ferrous  sulfate 325 (65 FE) MG tablet Commonly known as: FerrouSul Take 1 tablet (325 mg total) by mouth 3 (three) times daily with meals for 14 days. What changed: when to take this   levothyroxine 125 MCG tablet Commonly known as: SYNTHROID Take 125 mcg by mouth daily before breakfast.   methocarbamol 500 MG tablet Commonly known as: Robaxin Take 1 tablet (500 mg total) by mouth every 6 (six) hours as needed for muscle spasms.   metoprolol tartrate 50 MG tablet Commonly known as: LOPRESSOR TAKE 1 TABLET BY MOUTH TWICE DAILY   multivitamin with minerals Tabs tablet Take 1 tablet by mouth daily.   oxyCODONE 5 MG immediate release tablet Commonly known as: Oxy IR/ROXICODONE Take 1-2 tablets (5-10 mg total) by mouth every 4 (four) hours as needed for moderate pain or severe pain.   polyethylene glycol 17 g packet Commonly known as: MIRALAX / GLYCOLAX Take 17 g by mouth 2 (two) times daily.   rivaroxaban 20 MG Tabs tablet Commonly known as: Xarelto Take 1 tablet (20 mg total) by mouth daily with breakfast.            Discharge Care Instructions  (From admission, onward)         Start     Ordered   08/16/19 0000  Change dressing       Comments: Maintain surgical dressing until follow up in the clinic. If the edges start to pull up, may reinforce with tape. If the dressing is no longer working, may remove and cover with gauze and tape, but must keep the area dry and clean.  Call with any questions or concerns.   08/16/19 1248   08/16/19 0000  Change dressing       Comments: Maintain surgical dressing until follow up in the clinic. If the edges start to pull up, may reinforce with tape. If the dressing is no longer working, may remove and cover with gauze and tape, but must keep the area dry and clean.  Call with any questions or concerns.   08/16/19 1248   08/16/19 0000  Partial weight bearing       Question Answer Comment  % Body Weight 50   Laterality left   Extremity Lower       08/16/19 1248  Diagnostic Studies: No results found.  Disposition: Discharge disposition: 01-Home or Self Care       Discharge Instructions    Call MD / Call 911   Complete by: As directed    If you experience chest pain or shortness of breath, CALL 911 and be transported to the hospital emergency room.  If you develope a fever above 101 F, pus (white drainage) or increased drainage or redness at the wound, or calf pain, call your surgeon's office.   Change dressing   Complete by: As directed    Maintain surgical dressing until follow up in the clinic. If the edges start to pull up, may reinforce with tape. If the dressing is no longer working, may remove and cover with gauze and tape, but must keep the area dry and clean.  Call with any questions or concerns.   Change dressing   Complete by: As directed    Maintain surgical dressing until follow up in the clinic. If the edges start to pull up, may reinforce with tape. If the dressing is no longer working, may remove and cover with gauze and tape, but must keep the area dry and clean.  Call with any questions or concerns.   Constipation Prevention   Complete by: As directed    Drink plenty of fluids.  Prune juice may be helpful.  You may use a stool softener, such as Colace (over the counter) 100 mg twice a day.  Use MiraLax (over the counter) for constipation as needed.   Diet - low sodium heart healthy   Complete by: As directed    Discharge instructions   Complete by: As directed    Maintain surgical dressing until follow up in the clinic. If the edges start to pull up, may reinforce with tape. If the dressing is no longer working, may remove and cover with gauze and tape, but must keep the area dry and clean.  Follow up in 2 weeks at [facility]. Call with any questions or concerns.   Partial weight bearing   Complete by: As directed    % Body Weight: 50   Laterality: left   Extremity: Lower   TED hose    Complete by: As directed    Use stockings (TED hose) for 2 weeks on both leg(s).  You may remove them at night for sleeping.   TED hose   Complete by: As directed    Use stockings (TED hose) for 2 weeks on both leg(s).  You may remove them at night for sleeping.       Follow-up Information    Paralee Cancel, MD. Schedule an appointment as soon as possible for a visit on 08/22/2019.   Specialty: Orthopedic Surgery Why: For wound check. Contact information: [address] [place] [address] [phone]                Signed: Lucille Passy [facility] 08/16/2019, 12:49 PM

## 2019-08-19 LAB — AEROBIC/ANAEROBIC CULTURE W GRAM STAIN (SURGICAL/DEEP WOUND)
Culture: NO GROWTH
Gram Stain: NONE SEEN

## 2019-09-20 ENCOUNTER — Encounter: Payer: Self-pay | Admitting: Cardiovascular Disease

## 2019-09-20 ENCOUNTER — Ambulatory Visit: Payer: Medicare Other | Admitting: Cardiovascular Disease

## 2019-09-20 ENCOUNTER — Other Ambulatory Visit: Payer: Self-pay

## 2019-09-20 VITALS — BP 110/65 | HR 76 | Ht 68.0 in | Wt 376.8 lb

## 2019-09-20 DIAGNOSIS — E669 Obesity, unspecified: Secondary | ICD-10-CM | POA: Diagnosis not present

## 2019-09-20 DIAGNOSIS — Z7901 Long term (current) use of anticoagulants: Secondary | ICD-10-CM

## 2019-09-20 DIAGNOSIS — I4819 Other persistent atrial fibrillation: Secondary | ICD-10-CM

## 2019-09-20 DIAGNOSIS — G4733 Obstructive sleep apnea (adult) (pediatric): Secondary | ICD-10-CM | POA: Diagnosis not present

## 2019-09-20 DIAGNOSIS — I5032 Chronic diastolic (congestive) heart failure: Secondary | ICD-10-CM | POA: Diagnosis not present

## 2019-09-20 NOTE — Patient Instructions (Signed)

## 2019-09-21 ENCOUNTER — Encounter: Payer: Self-pay | Admitting: Cardiovascular Disease
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
## 2019-09-27 ENCOUNTER — Telehealth: Payer: Self-pay | Admitting: Licensed Clinical Social Worker

## 2019-09-27 NOTE — Telephone Encounter (Signed)
CSW consulted to speak with pt regarding housing concerns.    CSW called and spoke with pt who confirms that he is being evicted from his current living situation.  States he is staying at a friend/former coworkers house where he has lived for 14 years but now they want to renovate the house and allow his daughter to live there.  Pt states he was originally given 8 weeks to move out but thinks it has been about that long- has not been told anything further regarding moving out since then.  Pt states he has an appt tomorrow with a realtor and he is trying to get a home in [place]- he is hopeful to know something early next week whether or not he got it.  Pt does not think he needs help at this time but is agreeable to CSW touching base with him next week regarding his progress.  CSW will continue to follow and assist as needed  Jorge Ny, [facility] Desk#: [phone] Cell#: [phone]

## 2019-10-03 ENCOUNTER — Telehealth (HOSPITAL_COMMUNITY): Payer: Self-pay | Admitting: Licensed Clinical Social Worker

## 2019-10-03 NOTE — Telephone Encounter (Signed)
CSW attempted to call pt to see how his housing search is going- unable to reach on either number and unable to leave a message- will try again at a later time  Jorge Ny, [facility] Desk#: [phone] Cell#: [phone]

## 2019-10-08 ENCOUNTER — Other Ambulatory Visit: Payer: Self-pay | Admitting: Cardiovascular Disease

## 2019-10-08 ENCOUNTER — Telehealth: Payer: Self-pay | Admitting: Licensed Clinical Social Worker

## 2019-10-08 NOTE — Telephone Encounter (Signed)
CSW called pt to check in regarding housing.  Was able to speak with pt who reports the house he looked at last week didn't work out but that he has been able to speak to his friend who owns the house who has now told him he is willing to allow the pt to stay in the house until he finds somewhere else to live.  Pt is continuing to look for housing with a realtor and family assistance so no help needed at this time.  CSW encouraged pt to reach out if anything changes and he is concerned with losing housing before having somewhere to go.  Will continue to follow and assist as needed  Jorge Ny, [facility] Desk#: [phone] Cell#: [phone]

## 2019-11-01 NOTE — Progress Notes (Addendum)
COVID Vaccine Completed: Date COVID Vaccine completed: COVID vaccine manufacturer: [place]   PCP - Bernerd Limbo, MD Cardiologist - Sanda Klein, MD LOV 09/20/19 f/u in 1 year  Chest x-ray -  EKG - 08-09-19 Stress Test -  ECHO -  Cardiac Cath -  Pacemaker/ICD device last checked:  Sleep Study -  CPAP -   Fasting Blood Sugar -  Checks Blood Sugar _____ times a day  Blood Thinner Instructions: Xarelto Aspirin Instructions: Last Dose: 11/02/19 3 days prior to surgery  Anesthesia review: Hx of DVT, PE, CKD, DM, A-fib, CHF  Patient denies shortness of breath, fever, cough and chest pain at PAT appointment  Able to perform ADL's w/o SOB   Patient verbalized understanding of instructions that were given to them at the PAT appointment. Patient was also instructed that they will need to review over the PAT instructions again at home before surgery.

## 2019-11-01 NOTE — Patient Instructions (Addendum)
DUE TO COVID-19 ONLY ONE VISITOR IS ALLOWED TO COME WITH YOU AND STAY IN THE WAITING ROOM ONLY DURING PRE OP AND PROCEDURE DAY OF SURGERY. THE 1 VISITOR  MAY VISIT WITH YOU AFTER SURGERY IN YOUR PRIVATE ROOM DURING VISITING HOURS ONLY!  YOU NEED TO HAVE A COVID 19 TEST ON 11-02-19 @ 1:00 PM, THIS TEST MUST BE DONE BEFORE SURGERY,  COVID TESTING SITE [place]  [id number], IT IS ON THE RIGHT GOING OUT [address] APPROXIMATELY  2 MINUTES PAST [employer] ON THE RIGHT. ONCE YOUR COVID TEST IS COMPLETED,  PLEASE BEGIN THE QUARANTINE INSTRUCTIONS AS OUTLINED IN YOUR HANDOUT.                Gerasimos Plotts  11/01/2019   Your procedure is scheduled on: 11-06-19   Report to [facility] Main  Entrance    Report to admitting at 6:30 AM     Call this number if you have problems the morning of surgery [phone]    REMEMBER: AFTER MIDNIGHT YOU [place] 6:00 AM. PLEASE FINISH G2 DRINK PER SURGEON ORDER   WHICH NEEDS TO BE COMPLETED AT 6:00 AM .      CLEAR LIQUID DIET   Foods Allowed                                                                    Coffee and tea, regular and decaf                            Fruit ices (not with fruit pulp)                                      Iced Popsicles                                    Carbonated beverages, regular and diet                                    Cranberry, grape and apple juices Sports drinks like Gatorade Lightly seasoned clear broth or consume(fat free) Sugar, honey syrup ___________________________________________________________________    Take these medicines the morning of surgery with A SIP OF WATER: Diltiazem (Cardizem), Levothryoxine (Synthroid), Metoprolol (Lopressor)   BRUSH YOUR TEETH MORNING OF SURGERY AND RINSE YOUR MOUTH OUT. NO CHEWING GUM CANDY OR MINTS.                                 You may not have any metal on your body including hair pins and               piercings     Do not wear jewelry, cologne, lotions, powders or deodorant              Men may shave face and neck.   Do not bring valuables to  the hospital. [facility] IS NOT             RESPONSIBLE   FOR VALUABLES.  Contacts, dentures or bridgework may not be worn into surgery.  You may bring a small overnight bag     Special Instructions: N/A              Please read over the following fact sheets you were given: _____________________________________________________________________  [facility] - Preparing for Surgery Before surgery, you can play an important role.  Because skin is not sterile, your skin needs to be as free of germs as possible.  You can reduce the number of germs on your skin by washing with CHG (chlorahexidine gluconate) soap before surgery.  CHG is an antiseptic cleaner which kills germs and bonds with the skin to continue killing germs even after washing. Please DO NOT use if you have an allergy to CHG or antibacterial soaps.  If your skin becomes reddened/irritated stop using the CHG and inform your nurse when you arrive at Short Stay. Do not shave (including legs and underarms) for at least 48 hours prior to the first CHG shower.  You may shave your face/neck. Please follow these instructions carefully:  1.  Shower with CHG Soap the night before surgery and the  morning of Surgery.  2.  If you choose to wash your hair, wash your hair first as usual with your  normal  shampoo.  3.  After you shampoo, rinse your hair and body thoroughly to remove the  shampoo.                           4.  Use CHG as you would any other liquid soap.  You can apply chg directly  to the skin and wash                       Gently with a scrungie or clean washcloth.  5.  Apply the CHG Soap to your body ONLY FROM THE NECK DOWN.   Do not use on face/ open                           Wound or open sores. Avoid contact with eyes, ears mouth and genitals (private parts).                        Wash face,  Genitals (private parts) with your normal soap.             6.  Wash thoroughly, paying special attention to the area where your surgery  will be performed.  7.  Thoroughly rinse your body with warm water from the neck down.  8.  DO NOT shower/wash with your normal soap after using and rinsing off  the CHG Soap.                9.  Pat yourself dry with a clean towel.            10.  Wear clean pajamas.            11.  Place clean sheets on your bed the night of your first shower and do not  sleep with pets. Day of Surgery : Do not apply any lotions/deodorants the morning of surgery.  Please wear clean clothes to the hospital/surgery center.  FAILURE TO FOLLOW THESE  INSTRUCTIONS MAY RESULT IN THE CANCELLATION OF YOUR SURGERY PATIENT SIGNATURE_________________________________  NURSE SIGNATURE__________________________________  ________________________________________________________________________    WHAT IS A BLOOD TRANSFUSION? Blood Transfusion Information  A transfusion is the replacement of blood or some of its parts. Blood is made up of multiple cells which provide different functions.  Red blood cells carry oxygen and are used for blood loss replacement.  White blood cells fight against infection.  Platelets control bleeding.  Plasma helps clot blood.  Other blood products are available for specialized needs, such as hemophilia or other clotting disorders. BEFORE THE TRANSFUSION  Who gives blood for transfusions?   Healthy volunteers who are fully evaluated to make sure their blood is safe. This is blood bank blood. Transfusion therapy is the safest it has ever been in the practice of medicine. Before blood is taken from a donor, a complete history is taken to make sure that person has no history of diseases nor engages in risky social behavior (examples are intravenous drug use or sexual activity with multiple partners). The donor's travel history  is screened to minimize risk of transmitting infections, such as malaria. The donated blood is tested for signs of infectious diseases, such as HIV and hepatitis. The blood is then tested to be sure it is compatible with you in order to minimize the chance of a transfusion reaction. If you or a relative donates blood, this is often done in anticipation of surgery and is not appropriate for emergency situations. It takes many days to process the donated blood. RISKS AND COMPLICATIONS Although transfusion therapy is very safe and saves many lives, the main dangers of transfusion include:   Getting an infectious disease.  Developing a transfusion reaction. This is an allergic reaction to something in the blood you were given. Every precaution is taken to prevent this. The decision to have a blood transfusion has been considered carefully by your caregiver before blood is given. Blood is not given unless the benefits outweigh the risks. AFTER THE TRANSFUSION  Right after receiving a blood transfusion, you will usually feel much better and more energetic. This is especially true if your red blood cells have gotten low (anemic). The transfusion raises the level of the red blood cells which carry oxygen, and this usually causes an energy increase.  The nurse administering the transfusion will monitor you carefully for complications. HOME CARE INSTRUCTIONS  No special instructions are needed after a transfusion. You may find your energy is better. Speak with your caregiver about any limitations on activity for underlying diseases you may have. SEEK MEDICAL CARE IF:   Your condition is not improving after your transfusion.  You develop redness or irritation at the intravenous (IV) site. SEEK IMMEDIATE MEDICAL CARE IF:  Any of the following symptoms occur over the next 12 hours:  Shaking chills.  You have a temperature by mouth above 102 F (38.9 C), not controlled by medicine.  Chest, back, or  muscle pain.  People around you feel you are not acting correctly or are confused.  Shortness of breath or difficulty breathing.  Dizziness and fainting.  You get a rash or develop hives.  You have a decrease in urine output.  Your urine turns a dark color or changes to pink, red, or brown. Any of the following symptoms occur over the next 10 days:  You have a temperature by mouth above 102 F (38.9 C), not controlled by medicine.  Shortness of breath.  Weakness after normal activity.  The white  part of the eye turns yellow (jaundice).  You have a decrease in the amount of urine or are urinating less often.  Your urine turns a dark color or changes to pink, red, or brown. Document Released: 01/23/2000 Document Revised: 04/19/2011 Document Reviewed: 09/11/2007 [facility] Patient Information 2014 [place], [place].  _______________________________________________________________________

## 2019-11-02 ENCOUNTER — Other Ambulatory Visit: Payer: Self-pay

## 2019-11-02 ENCOUNTER — Encounter (HOSPITAL_COMMUNITY): Payer: Self-pay

## 2019-11-02 ENCOUNTER — Encounter (HOSPITAL_COMMUNITY)
Admission: RE | Admit: 2019-11-02 | Discharge: 2019-11-02 | Disposition: A | Discharge status: Home / Self Care | Payer: Medicare Other | Source: Ambulatory Visit | Attending: Orthopedic Surgery | Admitting: Orthopedic Surgery

## 2019-11-02 ENCOUNTER — Other Ambulatory Visit (HOSPITAL_COMMUNITY)
Admission: RE | Admit: 2019-11-02 | Discharge: 2019-11-02 | Disposition: A | Discharge status: Home / Self Care | Payer: Medicare Other | Source: Ambulatory Visit | Attending: Orthopedic Surgery | Admitting: Orthopedic Surgery

## 2019-11-02 DIAGNOSIS — Z20822 Contact with and (suspected) exposure to covid-19: Secondary | ICD-10-CM | POA: Insufficient documentation

## 2019-11-02 DIAGNOSIS — Z01812 Encounter for preprocedural laboratory examination: Secondary | ICD-10-CM | POA: Diagnosis present

## 2019-11-02 LAB — BASIC METABOLIC PANEL
Anion gap: 12 (ref 5–15)
BUN: 19 mg/dL (ref 8–23)
CO2: 24 mmol/L (ref 22–32)
Calcium: 9.7 mg/dL (ref 8.9–10.3)
Chloride: 107 mmol/L (ref 98–111)
Creatinine, Ser: 1.25 mg/dL — ABNORMAL HIGH (ref 0.61–1.24)
GFR calc Af Amer: >60 mL/min (ref >60)
GFR calc non Af Amer: 59 mL/min — ABNORMAL LOW (ref >60)
Glucose, Bld: 110 mg/dL — ABNORMAL HIGH (ref 70–99)
Potassium: 4.8 mmol/L (ref 3.5–5.1)
Sodium: 143 mmol/L (ref 135–145)

## 2019-11-02 LAB — HEMOGLOBIN A1C
Hgb A1c MFr Bld: 5.5 % (ref 4.8–5.6)
Mean Plasma Glucose: 111.15 mg/dL

## 2019-11-02 LAB — CBC
HCT: 35.2 % — ABNORMAL LOW (ref 39.0–52.0)
Hemoglobin: 10.5 g/dL — ABNORMAL LOW (ref 13.0–17.0)
MCH: 29.6 pg (ref 26.0–34.0)
MCHC: 29.8 g/dL — ABNORMAL LOW (ref 30.0–36.0)
MCV: 99.2 fL (ref 80.0–100.0)
Platelets: 414 10*3/uL — ABNORMAL HIGH (ref 150–400)
RBC: 3.55 MIL/uL — ABNORMAL LOW (ref 4.22–5.81)
RDW: 15.5 % (ref 11.5–15.5)
WBC: 4.9 10*3/uL (ref 4.0–10.5)
nRBC: 0 % (ref 0.0–0.2)

## 2019-11-02 LAB — SARS CORONAVIRUS 2 (TAT 6-24 HRS): SARS Coronavirus 2: NEGATIVE

## 2019-11-05 MED ORDER — DEXTROSE 5 % IV SOLN
3.0000 g | INTRAVENOUS | Status: AC
  Administered 2019-11-06: 3 g via INTRAVENOUS
  Filled 2019-11-05: qty 3

## 2019-11-05 NOTE — H&P (Signed)
[name] is an 69 y.o. male.    Chief Complaint: Infected left TKA with persistent drainage  Procedure:  I&D of infected left TKA  HPI: Pt is a 69 y.o. male complaining of persistent drainage of an infected left TKA.  He had been changing a wound vac twice a week which had been pulling out the drainage and slowly closing the wound.  Recently the swelling and drainage increased.  Dr. Alvan Dame saw the patient and it was determined best to I&D the knee with attempted wound closure. Various options are discussed with the patient and the I&D was chosen procedure at this time.  Risks, benefits and expectations were discussed with the patient. Patient understand the risks, benefits and expectations and wishes to proceed with surgery.    PCP: Bernerd Limbo, MD  D/C Plans:       Home  Post-op Meds:       No Rx given   Tranexamic Acid:      To be given - IV   Decadron:      Is to be given  FYI:     Xarelto  Oxycodone  DME:   Pt already has equipment  PT:   [facility]    PMH: Past Medical History:  Diagnosis Date  . CHF (congestive heart failure) ([name]) 05/2019  . Chronic anticoagulation    with Xarelto  . Complication of anesthesia    PT STATES HARD TO WAKE UP AFTER ONE SUGERY -STATES THE SURGERY TOOK LONGER THAN EXPECTED.  NO PROBLEMS WITH ANY OTHER SURGERY  . Complication of anesthesia    in 12/2018-states started  shaking after surgery, could not get warm   . Dysrhythmia    A-fib  . GERD (gastroesophageal reflux disease)   . History of blood clots   . History of blood transfusion   . Hypothyroidism   . Pain    BACK PAIN - PT ATTRIBUTES TO THE WAY HE WALKS DUE TO LEFT KNEE PROBLEM  . Persistent atrial fibrillation ([name])   . Septic arthritis of knee ([facility])    LEFT KNEE  . Shortness of breath    WITH EXERTION AND PAIN  . Sleep apnea    uses CPAP,    PSH: Past Surgical History:  Procedure Laterality Date  . 2010 REMOVAL OF LEFT TOTAL KNEE    . CARDIAC  CATHETERIZATION  2006  . CARDIOVERSION N/A 08/28/2012   Procedure: CARDIOVERSION;  Surgeon: Sanda Klein, MD;  Location: [name];  Service: Cardiovascular;  Laterality: N/A;  . CARDIOVERSION N/A 09/01/2012   Procedure: CARDIOVERSION;  Surgeon: Pixie Casino, MD;  Location: [name];  Service: Cardiovascular;  Laterality: N/A;  . COLONOSCOPY N/A 06/13/2013   Procedure: COLONOSCOPY;  Surgeon: Inda Castle, MD;  Location: [name];  Service: [name];  Laterality: N/A;  . EXCISIONAL TOTAL KNEE ARTHROPLASTY WITH ANTIBIOTIC SPACERS Left 09/21/2018   Procedure: Resection of tibia versus both components with placement of antibiotic spacer;  Surgeon: Paralee Cancel, MD;  Location: [name];  Service: Orthopedics;  Laterality: Left;  2.5 hrs  . EXCISIONAL TOTAL KNEE ARTHROPLASTY WITH ANTIBIOTIC SPACERS Left 01/02/2019   Procedure: Repeat Washout and placement of antibiotic spacer left knee;  Surgeon: Paralee Cancel, MD;  Location: [name];  Service: Orthopedics;  Laterality: Left;  2 hrs  . HYDROCELECTOY   2012  . I & D EXTREMITY Left 08/14/2019   Procedure: IRRIGATION AND DEBRIDEMENT LEFT KNEE WOUND;  Surgeon: Paralee Cancel, MD;  Location: [name];  Service: Orthopedics;  Laterality: Left;  60 mins  . I & D KNEE WITH POLY EXCHANGE Left 05/17/2019   Procedure: IRRIGATION AND DEBRIDEMENT KNEE WITH POLY EXCHANGE;  Surgeon: Paralee Cancel, MD;  Location: [name];  Service: Orthopedics;  Laterality: Left;  90 mins  . IRRIGATION AND DEBRIDEMENT KNEE Left 04/03/2019   Procedure: REIMPLANTATION OF LEFT KNEE TIBIAL COMPONENT.;  Surgeon: Paralee Cancel, MD;  Location: [name];  Service: Orthopedics;  Laterality: Left;  need 120 min  . LEFT KNEE ARTHROSCOPY  1999  . LEFT KNEE SURGERY UPPER TIBIAL OSTOMY    . LEFT TOTAL KNEE REMOVAL FOR INFECTION  2006  . REIMPLANTATION LEFT TOTAL KNEE  2008  . REIMPLANTATION LEFT TOTAL KNEE   2006  . REIMPLANTATION OF TOTAL KNEE Left 06/23/2012   Procedure: REIMPLANTATION OF LEFT TOTAL  KNEE;  Surgeon: Garrett Pole, MD;  Location: [name];  Service: Orthopedics;  Laterality: Left;  . REMOVAL LEFT TOTAL KNEE   2008  . REPLACEMENT LEFT KNEE  2002  . REPLACEMENT RIGHT KNEE  2003  . REVISION LEFT KNEE CAP   2004  . RIGHT KNEE ARTHROSCOPY  1998  . TEE WITHOUT CARDIOVERSION N/A 08/28/2012   Procedure: TRANSESOPHAGEAL ECHOCARDIOGRAM (TEE);  Surgeon: Sanda Klein, MD;  Location: [facility];  Service: Cardiovascular;  Laterality: N/A;  . TONSILLECTOMY    . TOTAL KNEE REVISION Left 12/15/2015   Procedure: TOTAL KNEE REVISION REPLACEMENT;  Surgeon: Paralee Cancel, MD;  Location: [name];  Service: Orthopedics;  Laterality: Left;  Adductor Block    Social History:  reports that he has never smoked. He has never used smokeless tobacco. He reports previous alcohol use. He reports that he does not use drugs.  Allergies:  Allergies  Allergen Reactions  . Morphine Hives    Medications: Current Facility-Administered Medications  Medication Dose Route Frequency Provider Last Rate Last Admin  . [START ON 11/06/2019] ceFAZolin (ANCEF) 3 g in dextrose 5 % 50 mL IVPB  3 g Intravenous On Call to OR Minda Ditto, [name]       Current Outpatient Medications  Medication Sig Dispense Refill  . diltiazem (CARDIZEM CD) 240 MG 24 hr capsule TAKE 1 CAPSULE BY MOUTH DAILY (Patient taking differently: Take 240 mg by mouth daily. ) 90 capsule 3  . doxycycline (VIBRAMYCIN) 100 MG capsule Take 1 capsule (100 mg total) by mouth 2 (two) times daily. 60 capsule 3  . ferrous sulfate (FERROUSUL) 325 (65 FE) MG tablet Take 1 tablet (325 mg total) by mouth 3 (three) times daily with meals for 14 days. (Patient taking differently: Take 325 mg by mouth daily. ) 42 tablet 0  . furosemide (LASIX) 20 MG tablet Take 20 mg by mouth daily as needed (fluid retention.).     [name] levothyroxine (SYNTHROID) 125 MCG tablet Take 125 mcg by mouth daily before breakfast.     . metoprolol tartrate (LOPRESSOR) 50 MG tablet TAKE 1 TABLET BY  MOUTH TWICE DAILY (Patient taking differently: Take 50 mg by mouth 2 (two) times daily. ) 180 tablet 2  . Multiple Vitamin (MULTIVITAMIN WITH MINERALS) TABS Take 1 tablet by mouth daily.    [name] 20 MG TABS tablet TAKE 1 TABLET BY MOUTH DAILY WITH BREAKFAST (Patient taking differently: No sig reported) 90 tablet 3  . acetaminophen (TYLENOL) 500 MG tablet Take 2 tablets (1,000 mg total) by mouth every 8 (eight) hours. (Patient not taking: Reported on 10/31/2019) 30 tablet 0  . methocarbamol (ROBAXIN) 500 MG  tablet Take 1 tablet (500 mg total) by mouth every 6 (six) hours as needed for muscle spasms. 40 tablet 0  . oxyCODONE (OXY IR/ROXICODONE) 5 MG immediate release tablet Take 1-2 tablets (5-10 mg total) by mouth every 4 (four) hours as needed for moderate pain or severe pain. (Patient not taking: Reported on 10/31/2019) 60 tablet 0      Review of Systems  Constitutional: Negative.   HENT: Negative.   Eyes: Negative.   Respiratory: Negative.   Cardiovascular: Negative.   Gastrointestinal: Positive for heartburn.  Genitourinary: Negative.   Musculoskeletal: Positive for joint pain.  Skin: Negative.   Neurological: Negative.   Endo/Heme/Allergies: Negative.   Psychiatric/Behavioral: Negative.        Physical Exam Constitutional:      Appearance: He is well-developed.  HENT:     Head: Normocephalic.  Eyes:     Pupils: Pupils are equal, round, and reactive to light.  Neck:     Thyroid: No thyromegaly.     Vascular: No JVD.     Trachea: No tracheal deviation.  Cardiovascular:     Rate and Rhythm: Normal rate and regular rhythm.  Pulmonary:     Effort: Pulmonary effort is normal. No respiratory distress.     Breath sounds: Normal breath sounds. No wheezing.  Abdominal:     Palpations: Abdomen is soft.     Tenderness: There is no abdominal tenderness. There is no guarding.  Musculoskeletal:     Cervical back: Neck supple.     Left knee: Swelling, effusion and bony  tenderness present. Decreased range of motion. Tenderness present.  Lymphadenopathy:     Cervical: No cervical adenopathy.  Skin:    General: Skin is warm and dry.  Neurological:     Mental Status: He is alert and oriented to person, place, and time.        Assessment/Plan Assessment: Infected left TKA with persistent drainage  Plan: Patient will undergo a I&D of infected left TKA  on 11/06/2019 per Dr. Alvan Dame at [name]. Risks benefits and expectations were discussed with the patient. Patient understand risks, benefits and expectations and wishes to proceed.   [place] Kaimen Peine   PA-C  11/05/2019, 8:00 AM

## 2019-11-06 ENCOUNTER — Observation Stay: Payer: Self-pay

## 2019-11-06 ENCOUNTER — Other Ambulatory Visit: Payer: Self-pay

## 2019-11-06 ENCOUNTER — Ambulatory Visit (HOSPITAL_COMMUNITY): Payer: Medicare Other | Admitting: Certified Registered Nurse Anesthetist

## 2019-11-06 ENCOUNTER — Encounter (HOSPITAL_COMMUNITY)
Admission: RE | Disposition: A | Discharge status: Skilled Nursing Facility | Payer: Self-pay | Source: Home / Self Care | Attending: Orthopedic Surgery

## 2019-11-06 ENCOUNTER — Inpatient Hospital Stay (HOSPITAL_COMMUNITY)
Admission: RE | Admit: 2019-11-06 | Discharge: 2019-11-22 | DRG: 464 | Disposition: A | Discharge status: Skilled Nursing Facility | Payer: Medicare Other | Attending: Orthopedic Surgery | Admitting: Orthopedic Surgery

## 2019-11-06 ENCOUNTER — Ambulatory Visit (HOSPITAL_COMMUNITY): Payer: Medicare Other | Admitting: Physician Assistant

## 2019-11-06 ENCOUNTER — Encounter (HOSPITAL_COMMUNITY): Payer: Self-pay | Admitting: Orthopedic Surgery

## 2019-11-06 DIAGNOSIS — L905 Scar conditions and fibrosis of skin: Secondary | ICD-10-CM | POA: Diagnosis present

## 2019-11-06 DIAGNOSIS — Z86718 Personal history of other venous thrombosis and embolism: Secondary | ICD-10-CM

## 2019-11-06 DIAGNOSIS — Z96659 Presence of unspecified artificial knee joint: Secondary | ICD-10-CM

## 2019-11-06 DIAGNOSIS — Z885 Allergy status to narcotic agent status: Secondary | ICD-10-CM

## 2019-11-06 DIAGNOSIS — Z79890 Hormone replacement therapy (postmenopausal): Secondary | ICD-10-CM

## 2019-11-06 DIAGNOSIS — I4819 Other persistent atrial fibrillation: Secondary | ICD-10-CM | POA: Diagnosis present

## 2019-11-06 DIAGNOSIS — Z79899 Other long term (current) drug therapy: Secondary | ICD-10-CM

## 2019-11-06 DIAGNOSIS — M00862 Arthritis due to other bacteria, left knee: Secondary | ICD-10-CM

## 2019-11-06 DIAGNOSIS — Z8249 Family history of ischemic heart disease and other diseases of the circulatory system: Secondary | ICD-10-CM

## 2019-11-06 DIAGNOSIS — T8454XA Infection and inflammatory reaction due to internal left knee prosthesis, initial encounter: Principal | ICD-10-CM

## 2019-11-06 DIAGNOSIS — T8459XD Infection and inflammatory reaction due to other internal joint prosthesis, subsequent encounter: Secondary | ICD-10-CM

## 2019-11-06 DIAGNOSIS — Z20822 Contact with and (suspected) exposure to covid-19: Secondary | ICD-10-CM | POA: Diagnosis present

## 2019-11-06 DIAGNOSIS — K219 Gastro-esophageal reflux disease without esophagitis: Secondary | ICD-10-CM | POA: Diagnosis present

## 2019-11-06 DIAGNOSIS — Z7901 Long term (current) use of anticoagulants: Secondary | ICD-10-CM

## 2019-11-06 DIAGNOSIS — Y831 Surgical operation with implant of artificial internal device as the cause of abnormal reaction of the patient, or of later complication, without mention of misadventure at the time of the procedure: Secondary | ICD-10-CM | POA: Diagnosis present

## 2019-11-06 DIAGNOSIS — Z833 Family history of diabetes mellitus: Secondary | ICD-10-CM

## 2019-11-06 DIAGNOSIS — M009 Pyogenic arthritis, unspecified: Secondary | ICD-10-CM | POA: Diagnosis present

## 2019-11-06 DIAGNOSIS — I708 Atherosclerosis of other arteries: Secondary | ICD-10-CM | POA: Diagnosis present

## 2019-11-06 DIAGNOSIS — G4730 Sleep apnea, unspecified: Secondary | ICD-10-CM | POA: Diagnosis present

## 2019-11-06 DIAGNOSIS — E039 Hypothyroidism, unspecified: Secondary | ICD-10-CM | POA: Diagnosis present

## 2019-11-06 HISTORY — PX: IRRIGATION AND DEBRIDEMENT KNEE: SHX5185

## 2019-11-06 LAB — CREATININE, SERUM
Creatinine, Ser: 1.15 mg/dL (ref 0.61–1.24)
GFR calc Af Amer: >60 mL/min (ref >60)
GFR calc non Af Amer: >60 mL/min (ref >60)

## 2019-11-06 LAB — TYPE AND SCREEN
ABO/RH(D): O POS
Antibody Screen: NEGATIVE

## 2019-11-06 SURGERY — IRRIGATION AND DEBRIDEMENT KNEE
Anesthesia: General | Site: Knee | Laterality: Left

## 2019-11-06 MED ORDER — ONDANSETRON HCL 4 MG PO TABS
4.0000 mg | ORAL_TABLET | Freq: Four times a day (QID) | ORAL | Status: DC | PRN
  Filled 2019-11-06: qty 1

## 2019-11-06 MED ORDER — METOCLOPRAMIDE HCL 5 MG/ML IJ SOLN: 5.0000 mg | Freq: Three times a day (TID) | INTRAMUSCULAR | Status: DC | PRN

## 2019-11-06 MED ORDER — ONDANSETRON HCL 4 MG/2ML IJ SOLN
INTRAMUSCULAR | Status: AC
  Filled 2019-11-06: qty 2

## 2019-11-06 MED ORDER — CHLORHEXIDINE GLUCONATE 0.12 % MT SOLN
15.0000 mL | Freq: Once | OROMUCOSAL | Status: AC
  Administered 2019-11-06: 15 mL via OROMUCOSAL

## 2019-11-06 MED ORDER — ACETAMINOPHEN 325 MG PO TABS
325.0000 mg | ORAL_TABLET | Freq: Four times a day (QID) | ORAL | Status: DC | PRN
  Administered 2019-11-09: 650 mg via ORAL
  Filled 2019-11-06: qty 2

## 2019-11-06 MED ORDER — DEXAMETHASONE SODIUM PHOSPHATE 10 MG/ML IJ SOLN
INTRAMUSCULAR | Status: AC
  Filled 2019-11-06: qty 1

## 2019-11-06 MED ORDER — MIDAZOLAM HCL 2 MG/2ML IJ SOLN
INTRAMUSCULAR | Status: AC
  Filled 2019-11-06: qty 2

## 2019-11-06 MED ORDER — PHENYLEPHRINE 40 MCG/ML (10ML) SYRINGE FOR IV PUSH (FOR BLOOD PRESSURE SUPPORT)
PREFILLED_SYRINGE | INTRAVENOUS | Status: DC | PRN
  Administered 2019-11-06: 80 ug via INTRAVENOUS
  Administered 2019-11-06 (×2): 120 ug via INTRAVENOUS

## 2019-11-06 MED ORDER — FERROUS SULFATE 325 (65 FE) MG PO TABS
325.0000 mg | ORAL_TABLET | Freq: Two times a day (BID) | ORAL | Status: DC
  Administered 2019-11-06 – 2019-11-22 (×27): 325 mg via ORAL
  Filled 2019-11-06 (×30): qty 1

## 2019-11-06 MED ORDER — POLYETHYLENE GLYCOL 3350 17 G PO PACK
17.0000 g | PACK | Freq: Two times a day (BID) | ORAL | Status: DC
  Administered 2019-11-06 – 2019-11-21 (×24): 17 g via ORAL
  Filled 2019-11-06 (×29): qty 1

## 2019-11-06 MED ORDER — BISACODYL 10 MG RE SUPP: 10.0000 mg | Freq: Every day | RECTAL | Status: DC | PRN

## 2019-11-06 MED ORDER — FENTANYL CITRATE (PF) 100 MCG/2ML IJ SOLN
INTRAMUSCULAR | Status: AC
  Filled 2019-11-06: qty 2

## 2019-11-06 MED ORDER — IRRISEPT - 450ML BOTTLE WITH 0.05% CHG IN STERILE WATER, USP 99.95% OPTIME
TOPICAL | Status: DC | PRN
  Administered 2019-11-06: 450 mL

## 2019-11-06 MED ORDER — HYDROMORPHONE HCL 1 MG/ML IJ SOLN
0.5000 mg | INTRAMUSCULAR | Status: DC | PRN
  Administered 2019-11-13: 1 mg via INTRAVENOUS
  Filled 2019-11-06: qty 1
  Filled 2019-11-06: qty 0.5

## 2019-11-06 MED ORDER — ONDANSETRON HCL 4 MG/2ML IJ SOLN: 4.0000 mg | Freq: Four times a day (QID) | INTRAMUSCULAR | Status: DC | PRN

## 2019-11-06 MED ORDER — ONDANSETRON HCL 4 MG/2ML IJ SOLN
INTRAMUSCULAR | Status: DC | PRN
  Administered 2019-11-06: 4 mg via INTRAVENOUS

## 2019-11-06 MED ORDER — DILTIAZEM HCL ER COATED BEADS 120 MG PO CP24
240.0000 mg | ORAL_CAPSULE | Freq: Every day | ORAL | Status: DC
  Administered 2019-11-07 – 2019-11-22 (×14): 240 mg via ORAL
  Filled 2019-11-06: qty 2
  Filled 2019-11-06: qty 1
  Filled 2019-11-06: qty 2
  Filled 2019-11-06 (×2): qty 1
  Filled 2019-11-06: qty 2
  Filled 2019-11-06: qty 1
  Filled 2019-11-06: qty 2
  Filled 2019-11-06 (×4): qty 1
  Filled 2019-11-06: qty 2
  Filled 2019-11-06: qty 1

## 2019-11-06 MED ORDER — VANCOMYCIN HCL 10 G IV SOLR
2500.0000 mg | Freq: Once | INTRAVENOUS | Status: AC
  Administered 2019-11-06: 2500 mg via INTRAVENOUS
  Filled 2019-11-06: qty 2000

## 2019-11-06 MED ORDER — VANCOMYCIN HCL 1250 MG/250ML IV SOLN
1250.0000 mg | Freq: Two times a day (BID) | INTRAVENOUS | Status: DC
  Administered 2019-11-07 – 2019-11-08 (×3): 1250 mg via INTRAVENOUS
  Filled 2019-11-06 (×4): qty 250

## 2019-11-06 MED ORDER — PROPOFOL 10 MG/ML IV BOLUS
INTRAVENOUS | Status: DC | PRN
  Administered 2019-11-06: 200 mg via INTRAVENOUS

## 2019-11-06 MED ORDER — CEFAZOLIN SODIUM-DEXTROSE 2-4 GM/100ML-% IV SOLN
2.0000 g | Freq: Four times a day (QID) | INTRAVENOUS | Status: AC
  Administered 2019-11-06 (×2): 2 g via INTRAVENOUS
  Filled 2019-11-06 (×2): qty 100

## 2019-11-06 MED ORDER — MAGNESIUM CITRATE PO SOLN: 1.0000 | Freq: Once | ORAL | Status: DC | PRN

## 2019-11-06 MED ORDER — ALUM & MAG HYDROXIDE-SIMETH 200-200-20 MG/5ML PO SUSP: 15.0000 mL | ORAL | Status: DC | PRN

## 2019-11-06 MED ORDER — DOCUSATE SODIUM 100 MG PO CAPS
100.0000 mg | ORAL_CAPSULE | Freq: Two times a day (BID) | ORAL | Status: DC
  Administered 2019-11-06 – 2019-11-16 (×20): 100 mg via ORAL
  Filled 2019-11-06 (×21): qty 1

## 2019-11-06 MED ORDER — POVIDONE-IODINE 10 % EX SOLN
CUTANEOUS | Status: DC | PRN
  Administered 2019-11-06: 1 via TOPICAL

## 2019-11-06 MED ORDER — TRANEXAMIC ACID-NACL 1000-0.7 MG/100ML-% IV SOLN
1000.0000 mg | INTRAVENOUS | Status: AC
  Administered 2019-11-06: 1000 mg via INTRAVENOUS
  Filled 2019-11-06: qty 100

## 2019-11-06 MED ORDER — KETOROLAC TROMETHAMINE 30 MG/ML IJ SOLN
30.0000 mg | Freq: Once | INTRAMUSCULAR | Status: AC | PRN
  Administered 2019-11-06: 30 mg via INTRAVENOUS

## 2019-11-06 MED ORDER — VANCOMYCIN HCL 1000 MG IV SOLR
INTRAVENOUS | Status: DC | PRN
  Administered 2019-11-06: 1000 mg via TOPICAL

## 2019-11-06 MED ORDER — RIVAROXABAN 10 MG PO TABS
20.0000 mg | ORAL_TABLET | Freq: Every day | ORAL | Status: DC
  Administered 2019-11-07 – 2019-11-13 (×7): 20 mg via ORAL
  Filled 2019-11-06 (×8): qty 2

## 2019-11-06 MED ORDER — ORAL CARE MOUTH RINSE: 15.0000 mL | Freq: Once | OROMUCOSAL | Status: AC

## 2019-11-06 MED ORDER — PHENYLEPHRINE HCL-NACL 10-0.9 MG/250ML-% IV SOLN
INTRAVENOUS | Status: DC | PRN
  Administered 2019-11-06: 40 ug/min via INTRAVENOUS

## 2019-11-06 MED ORDER — LACTATED RINGERS IV SOLN: INTRAVENOUS | Status: DC

## 2019-11-06 MED ORDER — TRANEXAMIC ACID-NACL 1000-0.7 MG/100ML-% IV SOLN
1000.0000 mg | Freq: Once | INTRAVENOUS | Status: AC
  Administered 2019-11-06: 1000 mg via INTRAVENOUS
  Filled 2019-11-06: qty 100

## 2019-11-06 MED ORDER — DIPHENHYDRAMINE HCL 12.5 MG/5ML PO ELIX: 12.5000 mg | ORAL_SOLUTION | ORAL | Status: DC | PRN

## 2019-11-06 MED ORDER — PHENOL 1.4 % MT LIQD: 1.0000 | OROMUCOSAL | Status: DC | PRN

## 2019-11-06 MED ORDER — METOCLOPRAMIDE HCL 5 MG PO TABS: 5.0000 mg | ORAL_TABLET | Freq: Three times a day (TID) | ORAL | Status: DC | PRN

## 2019-11-06 MED ORDER — OXYCODONE HCL 5 MG PO TABS
5.0000 mg | ORAL_TABLET | ORAL | Status: DC | PRN
  Administered 2019-11-06 – 2019-11-16 (×11): 10 mg via ORAL
  Filled 2019-11-06 (×12): qty 2
  Filled 2019-11-06: qty 1
  Filled 2019-11-06: qty 2

## 2019-11-06 MED ORDER — LIDOCAINE 2% (20 MG/ML) 5 ML SYRINGE
INTRAMUSCULAR | Status: DC | PRN
  Administered 2019-11-06: 100 mg via INTRAVENOUS

## 2019-11-06 MED ORDER — METOPROLOL TARTRATE 50 MG PO TABS
50.0000 mg | ORAL_TABLET | Freq: Two times a day (BID) | ORAL | Status: DC
  Administered 2019-11-06 – 2019-11-22 (×26): 50 mg via ORAL
  Filled 2019-11-06 (×31): qty 1

## 2019-11-06 MED ORDER — DEXAMETHASONE SODIUM PHOSPHATE 10 MG/ML IJ SOLN
10.0000 mg | Freq: Once | INTRAMUSCULAR | Status: AC
  Administered 2019-11-06: 10 mg via INTRAVENOUS

## 2019-11-06 MED ORDER — OXYCODONE HCL 5 MG PO TABS
10.0000 mg | ORAL_TABLET | ORAL | Status: DC | PRN
  Administered 2019-11-09 – 2019-11-12 (×9): 10 mg via ORAL
  Administered 2019-11-13: 15 mg via ORAL
  Administered 2019-11-15: 10 mg via ORAL
  Filled 2019-11-06 (×9): qty 2

## 2019-11-06 MED ORDER — SODIUM CHLORIDE 0.9 % IV SOLN: INTRAVENOUS | Status: DC

## 2019-11-06 MED ORDER — PROMETHAZINE HCL 25 MG/ML IJ SOLN: 6.2500 mg | INTRAMUSCULAR | Status: DC | PRN

## 2019-11-06 MED ORDER — METHOCARBAMOL 500 MG PO TABS
500.0000 mg | ORAL_TABLET | Freq: Four times a day (QID) | ORAL | Status: DC | PRN
  Administered 2019-11-06 – 2019-11-14 (×11): 500 mg via ORAL
  Filled 2019-11-06 (×11): qty 1

## 2019-11-06 MED ORDER — LEVOTHYROXINE SODIUM 25 MCG PO TABS
125.0000 ug | ORAL_TABLET | Freq: Every day | ORAL | Status: DC
  Administered 2019-11-07 – 2019-11-22 (×16): 125 ug via ORAL
  Filled 2019-11-06 (×16): qty 1

## 2019-11-06 MED ORDER — MEPERIDINE HCL 50 MG/ML IJ SOLN: 6.2500 mg | INTRAMUSCULAR | Status: DC | PRN

## 2019-11-06 MED ORDER — MENTHOL 3 MG MT LOZG: 1.0000 | LOZENGE | OROMUCOSAL | Status: DC | PRN

## 2019-11-06 MED ORDER — SODIUM CHLORIDE 0.9 % IR SOLN
Status: DC | PRN
  Administered 2019-11-06 (×2): 3000 mL

## 2019-11-06 MED ORDER — FENTANYL CITRATE (PF) 100 MCG/2ML IJ SOLN
25.0000 ug | INTRAMUSCULAR | Status: DC | PRN
  Administered 2019-11-06 (×2): 50 ug via INTRAVENOUS

## 2019-11-06 MED ORDER — KETOROLAC TROMETHAMINE 30 MG/ML IJ SOLN
INTRAMUSCULAR | Status: AC
  Filled 2019-11-06: qty 1

## 2019-11-06 MED ORDER — 0.9 % SODIUM CHLORIDE (POUR BTL) OPTIME
TOPICAL | Status: DC | PRN
  Administered 2019-11-06: 1000 mL

## 2019-11-06 MED ORDER — FENTANYL CITRATE (PF) 100 MCG/2ML IJ SOLN
INTRAMUSCULAR | Status: DC | PRN
Start: 2019-11-06 — End: 2019-11-06
  Administered 2019-11-06 (×4): 50 ug via INTRAVENOUS
  Administered 2019-11-06: 25 ug via INTRAVENOUS
  Administered 2019-11-06: 50 ug via INTRAVENOUS
  Administered 2019-11-06: 25 ug via INTRAVENOUS
  Administered 2019-11-06 (×2): 50 ug via INTRAVENOUS

## 2019-11-06 MED ORDER — METHOCARBAMOL 1000 MG/10ML IJ SOLN
500.0000 mg | Freq: Four times a day (QID) | INTRAVENOUS | Status: DC | PRN
  Filled 2019-11-06: qty 5

## 2019-11-06 MED ORDER — DEXAMETHASONE SODIUM PHOSPHATE 10 MG/ML IJ SOLN
10.0000 mg | Freq: Once | INTRAMUSCULAR | Status: AC
  Administered 2019-11-07: 10 mg via INTRAVENOUS
  Filled 2019-11-06: qty 1

## 2019-11-06 MED ORDER — PROPOFOL 1000 MG/100ML IV EMUL
INTRAVENOUS | Status: AC
  Filled 2019-11-06: qty 100

## 2019-11-06 MED ORDER — ACETAMINOPHEN 500 MG PO TABS
1000.0000 mg | ORAL_TABLET | Freq: Four times a day (QID) | ORAL | Status: AC
  Administered 2019-11-06 – 2019-11-07 (×3): 1000 mg via ORAL
  Filled 2019-11-06 (×3): qty 2

## 2019-11-06 MED ORDER — PHENYLEPHRINE HCL (PRESSORS) 10 MG/ML IV SOLN
INTRAVENOUS | Status: AC
  Filled 2019-11-06: qty 1

## 2019-11-06 MED ORDER — FUROSEMIDE 20 MG PO TABS: 20.0000 mg | ORAL_TABLET | Freq: Every day | ORAL | Status: DC | PRN

## 2019-11-06 MED ORDER — MIDAZOLAM HCL 5 MG/5ML IJ SOLN
INTRAMUSCULAR | Status: DC | PRN
  Administered 2019-11-06: 2 mg via INTRAVENOUS

## 2019-11-06 MED ORDER — VANCOMYCIN HCL 1000 MG IV SOLR
INTRAVENOUS | Status: AC
  Filled 2019-11-06: qty 1000

## 2019-11-06 SURGICAL SUPPLY — 47 items
BAG ZIPLOCK 12X15 (MISCELLANEOUS) ×2 IMPLANT
BLADE SAW SGTL 11.0X1.19X90.0M (BLADE) IMPLANT
BNDG ELASTIC 6X15 VLCR STRL LF (GAUZE/BANDAGES/DRESSINGS) ×2 IMPLANT
BNDG ELASTIC 6X5.8 VLCR STR LF (GAUZE/BANDAGES/DRESSINGS) ×2 IMPLANT
COVER SURGICAL LIGHT HANDLE (MISCELLANEOUS) ×2 IMPLANT
COVER WAND RF STERILE (DRAPES) ×2 IMPLANT
CUFF TOURN SGL QUICK 34 (TOURNIQUET CUFF)
CUFF TRNQT CYL 34X4.125X (TOURNIQUET CUFF) IMPLANT
DERMABOND ADVANCED (GAUZE/BANDAGES/DRESSINGS)
DERMABOND ADVANCED .7 DNX12 (GAUZE/BANDAGES/DRESSINGS) IMPLANT
DRAPE SHEET LG 3/4 BI-LAMINATE (DRAPES) IMPLANT
DRSG ADAPTIC 3X8 NADH LF (GAUZE/BANDAGES/DRESSINGS) IMPLANT
DRSG AQUACEL AG ADV 3.5X10 (GAUZE/BANDAGES/DRESSINGS) IMPLANT
DRSG PAD ABDOMINAL 8X10 ST (GAUZE/BANDAGES/DRESSINGS) ×6 IMPLANT
DRSG TEGADERM 4X4.75 (GAUZE/BANDAGES/DRESSINGS) IMPLANT
DURAPREP 26ML APPLICATOR (WOUND CARE) ×4 IMPLANT
EVACUATOR 1/8 PVC DRAIN (DRAIN) IMPLANT
GAUZE SPONGE 2X2 8PLY STRL LF (GAUZE/BANDAGES/DRESSINGS) IMPLANT
GAUZE SPONGE 4X4 12PLY STRL (GAUZE/BANDAGES/DRESSINGS) ×4 IMPLANT
GLOVE BIO SURGEON STRL SZ 6 (GLOVE) ×2 IMPLANT
GLOVE BIOGEL PI IND STRL 6.5 (GLOVE) ×1 IMPLANT
GLOVE BIOGEL PI IND STRL 7.5 (GLOVE) ×1 IMPLANT
GLOVE BIOGEL PI IND STRL 8.5 (GLOVE) IMPLANT
GLOVE BIOGEL PI INDICATOR 6.5 (GLOVE) ×1
GLOVE BIOGEL PI INDICATOR 7.5 (GLOVE) ×1
GLOVE BIOGEL PI INDICATOR 8.5 (GLOVE)
GLOVE ECLIPSE 8.0 STRL XLNG CF (GLOVE) IMPLANT
GLOVE ORTHO TXT STRL SZ7.5 (GLOVE) ×4 IMPLANT
GOWN STRL REUS W/TWL 2XL LVL3 (GOWN DISPOSABLE) IMPLANT
GOWN STRL REUS W/TWL LRG LVL3 (GOWN DISPOSABLE) ×4 IMPLANT
HANDPIECE INTERPULSE COAX TIP (DISPOSABLE) ×2
KIT TURNOVER KIT A (KITS) IMPLANT
MANIFOLD NEPTUNE II (INSTRUMENTS) ×2 IMPLANT
PACK TOTAL KNEE CUSTOM (KITS) ×2 IMPLANT
PADDING CAST COTTON 6X4 STRL (CAST SUPPLIES) ×4 IMPLANT
PENCIL SMOKE EVACUATOR (MISCELLANEOUS) ×2 IMPLANT
PROTECTOR NERVE ULNAR (MISCELLANEOUS) ×2 IMPLANT
SET HNDPC FAN SPRY TIP SCT (DISPOSABLE) ×1 IMPLANT
SPONGE GAUZE 2X2 STER 10/PKG (GAUZE/BANDAGES/DRESSINGS)
STAPLER VISISTAT 35W (STAPLE) IMPLANT
SUT MNCRL AB 4-0 PS2 18 (SUTURE) IMPLANT
SUT STRATAFIX PDS+ 0 24IN (SUTURE) ×2 IMPLANT
SUT VIC AB 1 CT1 36 (SUTURE) ×4 IMPLANT
SUT VIC AB 2-0 CT1 27 (SUTURE) ×6
SUT VIC AB 2-0 CT1 TAPERPNT 27 (SUTURE) ×3 IMPLANT
SWAB COLLECTION DEVICE MRSA (MISCELLANEOUS) IMPLANT
SWAB CULTURE ESWAB REG 1ML (MISCELLANEOUS) IMPLANT

## 2019-11-06 NOTE — Progress Notes (Signed)
Received order for PICC  Patient gets PICC placed by IR due to IV team unable to place centrally in past;  Please see FYI.  If PICC desired please refer to IR.  Floor RN notified

## 2019-11-06 NOTE — Transfer of Care (Signed)
Immediate Anesthesia Transfer of Care Note  Patient: [name]  Procedure(s) Performed: IRRIGATION AND DEBRIDEMENT KNEE (Left Knee)  Patient Location: PACU  Anesthesia Type:General  Level of Consciousness: drowsy and patient cooperative  Airway & Oxygen Therapy: Patient Spontanous Breathing and Patient connected to face mask oxygen  Post-op Assessment: Report given to RN and Post -op Vital signs reviewed and stable  Post vital signs: Reviewed and stable  Last Vitals:  Vitals Value Taken Time  BP 121/82 11/06/19 1150  Temp    Pulse 94 11/06/19 1154  Resp 26 11/06/19 1154  SpO2 97 % 11/06/19 1154  Vitals shown include unvalidated device data.  Last Pain:  Vitals:   11/06/19 1150  TempSrc:   PainSc: (P) 8       Patients Stated Pain Goal: 4 (34/14/43 6016)  Complications: No complications documented.

## 2019-11-06 NOTE — Anesthesia Procedure Notes (Addendum)
Procedure Name: LMA Insertion Date/Time: 11/06/2019 8:52 AM Performed by: Montel Clock, CRNA Pre-anesthesia Checklist: Patient identified, Emergency Drugs available, Suction available, Patient being monitored and Timeout performed Patient Re-evaluated:Patient Re-evaluated prior to induction Oxygen Delivery Method: Circle system utilized Preoxygenation: Pre-oxygenation with 100% oxygen Induction Type: IV induction LMA: LMA with gastric port inserted LMA Size: 5.0 Number of attempts: 1 Dental Injury: Teeth and Oropharynx as per pre-operative assessment  Comments: Foam donut under chin to improve seal.

## 2019-11-06 NOTE — Anesthesia Postprocedure Evaluation (Signed)
Anesthesia Post Note  Patient: [name]  Procedure(s) Performed: IRRIGATION AND DEBRIDEMENT KNEE (Left Knee)     Patient location during evaluation: PACU Anesthesia Type: General Level of consciousness: awake and alert Pain management: pain level controlled Vital Signs Assessment: post-procedure vital signs reviewed and stable Respiratory status: spontaneous breathing, nonlabored ventilation, respiratory function stable and patient connected to nasal cannula oxygen Cardiovascular status: blood pressure returned to baseline and stable Postop Assessment: no apparent nausea or vomiting Anesthetic complications: no   No complications documented.  Last Vitals:  Vitals:   11/06/19 1323 11/06/19 1416  BP: 110/79 111/83  Pulse: 94 98  Resp: 16 18  Temp: 36.7 C 36.7 C  SpO2: 98% 96%    Last Pain:  Vitals:   11/06/19 1416  TempSrc: Oral  PainSc:                  Effie Berkshire

## 2019-11-06 NOTE — Interval H&P Note (Signed)
History and Physical Interval Note:  11/06/2019 7:45 AM  [name]  has presented today for surgery, with the diagnosis of Chronic infected left total knee.  The various methods of treatment have been discussed with the patient and family. After consideration of risks, benefits and other options for treatment, the patient has consented to  Procedure(s) with comments: IRRIGATION AND DEBRIDEMENT KNEE (Left) - 90 mins as a surgical intervention.  The patient's history has been reviewed, patient examined, no change in status, stable for surgery.  I have reviewed the patient's chart and labs.  Questions were answered to the patient's satisfaction.     Mauri Pole

## 2019-11-06 NOTE — Brief Op Note (Signed)
11/06/2019  4:19 PM  PATIENT:  [name]  69 y.o. male  PRE-OPERATIVE DIAGNOSIS:  Chronic infected left total knee replacement  POST-OPERATIVE DIAGNOSIS:  Chronic infected left total knee replacement  PROCEDURE:  Procedure(s) with comments: IRRIGATION AND DEBRIDEMENT KNEE (Left) - 90 mins  SURGEON:  Surgeon(s) and Role:    Paralee Cancel, MD - Primary  PHYSICIAN ASSISTANT: Danae Orleans, PA-C  ANESTHESIA:   general  EBL:  100 mL   BLOOD ADMINISTERED:none  DRAINS: none   LOCAL MEDICATIONS USED:  NONE  SPECIMEN:  Source of Specimen:  left knee synovial fluid  DISPOSITION OF SPECIMEN:  PATHOLOGY  COUNTS:  YES  TOURNIQUET:   Total Tourniquet Time Documented: Thigh (Left) - 90 minutes Total: Thigh (Left) - 90 minutes   DICTATION: .Other Dictation: Dictation Number [id number]  PLAN OF CARE: Admit to inpatient   PATIENT DISPOSITION:  PACU - hemodynamically stable.   Delay start of Pharmacological VTE agent (>24hrs) due to surgical blood loss or risk of bleeding: no

## 2019-11-06 NOTE — Progress Notes (Signed)
Pharmacy Antibiotic Note  [name] is a 69 y.o. male admitted on 11/06/2019 with persistent drainage of an infected left TKA.  He had been changing a wound vac twice a week.  Presented on 9/28 for I&D of TKA; cefazolin given periop.  Pharmacy has been consulted for Vancomycin dosing. SCr elevated at 1.25 (11/02/19) Afebrile  Plan: Recheck SCr today Vancomycin 2500mg  IV x1 then 1250 mg IV q12h. Follow up renal function, culture results, and clinical course.   Height: 5\' 8"  (172.7 cm) Weight: (!) 176.1 kg (388 lb 5 oz) IBW/kg (Calculated) : 68.4  Temp (24hrs), Avg:97.9 F (36.6 C), Min:97.6 F (36.4 C), Max:98.4 F (36.9 C)  Recent Labs  Lab 11/02/19 1036  WBC 4.9  CREATININE 1.25*    Estimated Creatinine Clearance: 89.2 mL/min (A) (by C-G formula based on SCr of 1.25 mg/dL (H)).    Allergies  Allergen Reactions   Morphine Hives    Antimicrobials this admission: PTA Doxycycline 7/8 >> 9/27 9/28 Cefazolin x1 9/28 Vancomycin >>   Dose adjustments this admission:  Microbiology results: 9/28 Fluid culture:   Thank you for allowing pharmacy to be a part of this patients care.  Gretta Arab PharmD, BCPS Clinical Pharmacist [facility] main pharmacy [phone] 11/06/2019 1:19 PM

## 2019-11-06 NOTE — Op Note (Signed)
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  PROCEDURE:  Open excisional and non-excisional debridement of left knee.  This involved excising approximately 10-12 inch incision from skin to subcutaneous nonviable tissue.  Soft tissue planes were created.  The nonexcisional debridement included two 3  L bags of normal saline solution, a Betadine saline bath, as well as the IrriSept chlorhexidine bath.  SURGEON:  Paralee Cancel, MD  ASSISTANT:  Danae Orleans, PA-C.  Note that [name] was present for the entirety of the case from preoperative positioning, perioperative management of the operative extremity, general facilitation of the case and primary wound closure.  ANESTHESIA:  General.  SPECIMENS:  Fluid from the knee were obtained, as well as swabs to send to pathology for evaluation.  TOURNIQUET:  Up for 90 minutes at 250 mmHg.  ESTIMATED BLOOD LOSS:  Less  than 100 mL.  INDICATIONS  FOR PROCEDURE:  The patient is a pleasant 69 year old male with longstanding problems of his left knee, including infection and treatment, complicated by fracture and multiple surgical procedures.  Most recently, he has undergone revision of  his left knee to a more stable construct of the tibial component in the setting of chronic infection due to the fact that his proximal tibia had evidence of fracture related to previously placed cement block.  Unfortunately, from a social standpoint,  proceeding with more definitive treatment including resection, which would  be very morbid versus amputation again with added morbidity, it has been an extreme challenge to manage him clinically.  He has had persistent wound drainage indicative of chronic  infection.  We discussed at this point in repeating an I and D of the knee to see if we could buy some further time for social issues to be worked out in terms of help at home.  I discussed the case with Dr. Meridee Score for consideration of potential  amputation as definitive treatment down the road.  However, at this point, our mission is to try to provide more time.  DESCRIPTION OF PROCEDURE:  The patient was brought to the operative theater.  Once adequate anesthesia, preoperative antibiotics, Ancef administered, as well as tranexamic acid, he was positioned supine with a left thigh tourniquet placed.  The left  lower extremity was then prepped and draped in sterile fashion.  His wound VAC was removed in the OR.  He did have active purulent drainage from a couple of small pinpoint areas in the wound.  The leg was exsanguinated, tourniquet elevated to 250 mmHg.   Following a timeout, identifying the patient, the planned procedure and extremity, his old incision was excised as identified.  Soft tissue planes were created.  This was not easy due to the relate scarring in the subcutaneous area.  As we entered into  the subcutaneous area, we found that he had significant purulence, as well as necrotic nonviable tissue.  This area was excised sharply with the Bovie.  Once the superficial aspect of the joint was relatively clean, we did do a median arthrotomy.  He was  noted to have disruption of the infrapatellar region of about a centimeter or so of the patella tendon and  medial retinaculum.  With the arthrotomy, we encountered more purulent fluid.  At this point, with the knee relatively open, the vast majority of the case was carried out at an extensive debridement of the subpatellar the suprapatellar pouch, medial and lateral  gutters.  Once I felt that I debrided all sharply as much of the  nonviable tissues as I possibly could, we irrigated the knee first with 3 L normal saline solution.  I then placed in the knee for about 5 minutes a combination of Betadine and normal saline.  This was then evacuated from the wound and then 1 bottle of  IrriSept, about 500 mL was placed in the wound and kept for 5 minutes as well.  After the combination of the Betadine bath and soaked, as well as the IrriSept, we washed the knee out again with saline to prevent any soft tissue issues.  Once this was  complete, we then worked on reapproximating the extension mechanism which was not easy related to the significant scarring and damage related to his infections,  particularly in the infrapatellar region.  This was done with #1 Vicryl and also a Stratafix  suture to provide some further support.  The remainder of the wound was then closed in layers with 2-0 Vicryl and staples on the skin.  The skin was then dressed sterilely and a bulky dressing with Xeroform overlying.  He will be admitted to the  hospital at least for 1-2 nights as we work this process, considering revisiting PICC line and IV antibiotics for 6 weeks based on cultures.  If we have issues with his wound prior to discharge, we will arrange for home health nursing for wound VAC  changes, as well as for physical therapy.  VN/NUANCE  D:11/06/2019 T:11/06/2019 JOB:[id number]

## 2019-11-06 NOTE — Anesthesia Preprocedure Evaluation (Addendum)
Anesthesia Evaluation  Patient identified by MRN, date of birth, ID band Patient awake    Reviewed: Allergy & Precautions, NPO status , Patient's Chart, lab work & pertinent test results  Airway Mallampati: III       Dental  (+) Poor Dentition   Pulmonary sleep apnea and Continuous Positive Airway Pressure Ventilation ,    Pulmonary exam normal        Cardiovascular + dysrhythmias Atrial Fibrillation  Rhythm:Irregular Rate:Normal     Neuro/Psych negative neurological ROS  negative psych ROS   GI/Hepatic   Endo/Other  Hypothyroidism Morbid obesity  Renal/GU   negative genitourinary   Musculoskeletal   Abdominal (+) + obese,   Peds  Hematology   Anesthesia Other Findings   Reproductive/Obstetrics                            Anesthesia Physical Anesthesia Plan  ASA: III  Anesthesia Plan: General   Post-op Pain Management:    Induction: Intravenous  PONV Risk Score and Plan: 3 and Ondansetron, Midazolam and Treatment may vary due to age or medical condition  Airway Management Planned: LMA  Additional Equipment: None  Intra-op Plan:   Post-operative Plan: Extubation in OR  Informed Consent: I have reviewed the patients History and Physical, chart, labs and discussed the procedure including the risks, benefits and alternatives for the proposed anesthesia with the patient or authorized representative who has indicated his/her understanding and acceptance.     Dental advisory given  Plan Discussed with: CRNA  Anesthesia Plan Comments:         Anesthesia Quick Evaluation

## 2019-11-06 NOTE — Plan of Care (Signed)
POC initiated 

## 2019-11-06 NOTE — Evaluation (Signed)
Physical Therapy Evaluation Patient Details Name: [name] MRN: [id number] DOB: 1950/08/06 Today's Date: 11/06/2019   History of Present Illness  Patient is 69 y.o. male s/p Lt TKR with I&D on 11/06/19. Patient has history of left total knee arthroplasty in 9381 complicated by prosthetic joint infection, leading to multiple subsequent surgeries. He has had multiple revision arthroplasties, including resection with antibiotic spacers. He underwent TKR on 04/03/19 with 6 week antiobiotic treatment and then had I&D with polyexchange on 05/17/19 with wound vac placement. On 08/14/19 he had subsequent I&D. Other significant PMH includes A-fib, hypothyroidism, GERD, headaches, CHF.    Clinical Impression  [name] is a 69 y.o. male POD 0 s/p Lt TKR (I&D). Patient reports independence with use of RW for household and limited community mobility at baseline. Patient is now limited by functional impairments (see PT problem list below) and requires min assist for transfers and gait with RW. Patient was able to ambulate ~100 feet with RW and min guard assist. Patient instructed in exercise to facilitate ROM and circulation. Patient will benefit from continued skilled PT interventions to address impairments and progress towards PLOF. Acute PT will follow to progress mobility and stair training in preparation for safe discharge home.     Follow Up Recommendations Follow surgeon's recommendation for DC plan and follow-up therapies    Equipment Recommendations  None recommended by PT    Recommendations for Other Services       Precautions / Restrictions Precautions Precautions: Fall Restrictions Weight Bearing Restrictions: No Other Position/Activity Restrictions: WBAT      Mobility  Bed Mobility Overal bed mobility: Needs Assistance Bed Mobility: Supine to Sit     Supine to sit: Min guard;HOB elevated     General bed mobility comments: no assist required, pt using bed rail, takes  extra time.  Transfers Overall transfer level: Needs assistance Equipment used: Rolling walker (2 wheeled) Transfers: Sit to/from Stand Sit to Stand: Min guard;From elevated surface;Min assist         General transfer comment: light assist required for power up, pt steady in standing.  Ambulation/Gait Ambulation/Gait assistance: Min guard Gait Distance (Feet): 100 Feet Assistive device: Rolling walker (2 wheeled) Gait Pattern/deviations: Step-to pattern;Decreased stride length;Decreased weight shift to left;Wide base of support Gait velocity: decr   General Gait Details: cues for step pattern and to maintain safe proximitiy to RW; pt with good safety awareness and no overt LOB.  Stairs            Wheelchair Mobility    Modified Rankin (Stroke Patients Only)       Balance Overall balance assessment: Needs assistance Sitting-balance support: Feet supported Sitting balance-Leahy Scale: Good     Standing balance support: During functional activity;Bilateral upper extremity supported Standing balance-Leahy Scale: Fair                               Pertinent Vitals/Pain Pain Assessment: No/denies pain    Home Living Family/patient expects to be discharged to:: Private residence Living Arrangements: Alone Available Help at Discharge: Family Type of Home: House Home Access: [place]: One [place]: Environmental consultant - 2 wheels;Walker - 4 wheels;Cane - single point Additional Comments: pt reports he is unable to sit fully on toliet, squats over it, and he "bird baths" at sink.     Prior Function Level of Independence: Independent with assistive device(s)  Comments: Pt using RW at home prior to this knee surgery     Hand Dominance   Dominant Hand: Right    Extremity/Trunk Assessment   Upper Extremity Assessment Upper Extremity Assessment: Overall WFL for tasks assessed    Lower Extremity Assessment Lower  Extremity Assessment: LLE deficits/detail LLE Deficits / Details: pt limited by pain for SLR, good quad activation and no buckling in weight bearing LLE Sensation: WNL LLE Coordination: WNL    Cervical / Trunk Assessment Cervical / Trunk Assessment: Other exceptions Cervical / Trunk Exceptions: body habitus  Communication   Communication: No difficulties  Cognition Arousal/Alertness: Awake/alert Behavior During Therapy: WFL for tasks assessed/performed Overall Cognitive Status: Within Functional Limits for tasks assessed                                        General Comments      Exercises Total Joint Exercises Ankle Circles/Pumps: AROM;Both;15 reps;Seated Quad Sets: AROM;Left;10 reps;Seated Heel Slides: AROM;Left;10 reps;Seated   Assessment/Plan    PT Assessment Patient needs continued PT services  PT Problem List Decreased strength;Decreased range of motion;Decreased activity tolerance;Decreased balance;Decreased mobility;Decreased knowledge of use of DME;Decreased knowledge of precautions;Obesity       PT Treatment Interventions DME instruction;Gait training;Stair training;Functional mobility training;Therapeutic activities;Therapeutic exercise;Balance training;Patient/family education    PT Goals (Current goals can be found in the Care Plan section)  Acute Rehab PT Goals Patient Stated Goal: return home and stay independent PT Goal Formulation: With patient Time For Goal Achievement: 11/13/19 Potential to Achieve Goals: Good    Frequency 7X/week   Barriers to discharge        Co-evaluation               AM-PAC PT "6 Clicks" Mobility  Outcome Measure Help needed turning from your back to your side while in a flat bed without using bedrails?: None Help needed moving from lying on your back to sitting on the side of a flat bed without using bedrails?: A Little Help needed moving to and from a bed to a chair (including a wheelchair)?: A  Little Help needed standing up from a chair using your arms (e.g., wheelchair or bedside chair)?: A Little Help needed to walk in hospital room?: A Little Help needed climbing 3-5 steps with a railing? : A Little 6 Click Score: 19    End of Session Equipment Utilized During Treatment: Gait belt Activity Tolerance: Patient tolerated treatment well Patient left: in chair;with call bell/phone within reach;with chair alarm set Nurse Communication: Mobility status PT Visit Diagnosis: Muscle weakness (generalized) (M62.81);Difficulty in walking, not elsewhere classified (R26.2)    Time: 1736-1800 PT Time Calculation (min) (ACUTE ONLY): 24 min   Charges:   PT Evaluation $PT Eval Low Complexity: 1 Low PT Treatments $Therapeutic Exercise: 8-22 mins    Verner Mould, DPT Acute Rehabilitation Services  Office [phone] Pager [phone]  11/06/2019 6:38 PM

## 2019-11-07 ENCOUNTER — Encounter (HOSPITAL_COMMUNITY): Payer: Self-pay | Admitting: Orthopedic Surgery

## 2019-11-07 LAB — BASIC METABOLIC PANEL
Anion gap: 8 (ref 5–15)
BUN: 20 mg/dL (ref 8–23)
CO2: 22 mmol/L (ref 22–32)
Calcium: 9 mg/dL (ref 8.9–10.3)
Chloride: 106 mmol/L (ref 98–111)
Creatinine, Ser: 0.99 mg/dL (ref 0.61–1.24)
GFR calc Af Amer: >60 mL/min (ref >60)
GFR calc non Af Amer: >60 mL/min (ref >60)
Glucose, Bld: 175 mg/dL — ABNORMAL HIGH (ref 70–99)
Potassium: 4.5 mmol/L (ref 3.5–5.1)
Sodium: 136 mmol/L (ref 135–145)

## 2019-11-07 LAB — CBC
HCT: 28.9 % — ABNORMAL LOW (ref 39.0–52.0)
Hemoglobin: 8.4 g/dL — ABNORMAL LOW (ref 13.0–17.0)
MCH: 29.8 pg (ref 26.0–34.0)
MCHC: 29.1 g/dL — ABNORMAL LOW (ref 30.0–36.0)
MCV: 102.5 fL — ABNORMAL HIGH (ref 80.0–100.0)
Platelets: 316 10*3/uL (ref 150–400)
RBC: 2.82 MIL/uL — ABNORMAL LOW (ref 4.22–5.81)
RDW: 15.2 % (ref 11.5–15.5)
WBC: 6.6 10*3/uL (ref 4.0–10.5)
nRBC: 0 % (ref 0.0–0.2)

## 2019-11-07 MED ORDER — RIFAMPIN 300 MG PO CAPS
600.0000 mg | ORAL_CAPSULE | Freq: Every day | ORAL | Status: DC
  Administered 2019-11-07: 600 mg via ORAL
  Filled 2019-11-07: qty 2

## 2019-11-07 MED ORDER — SODIUM CHLORIDE 0.9 % IV SOLN
2.0000 g | INTRAVENOUS | Status: DC
  Administered 2019-11-07: 2 g via INTRAVENOUS
  Filled 2019-11-07: qty 2

## 2019-11-07 NOTE — TOC Initial Note (Signed)
Transition of Care [facility]) - Initial/Assessment Note    Patient Details  Name: [name] MRN: [id number] Date of Birth: July 28, 1950  [facility] CM/SW Contact:    Lennart Pall, LCSW Phone Number: 11/07/2019, 12:00 PM  Clinical Narrative:      Met with pt this morning to discuss anticipated dc needs.  Pt has had several rounds of discharges home with IV abx and HH follow up and feels comfortable with this being set up once again.  Pt does express concerns about his future needs if he has to undergo an amputation.  Notes that MD had discussed this with him as a very likely path in the next few months.   Pt feels comfortable managing at his current home with this discharge but feels he may need ALF if/when amputation is done.  We reviewed different living options in the area and the different levels of care.  Will gather information on community resources/ options for pt to use in planning.  TOC to continue following.           Expected Discharge Plan: [place] Barriers to Discharge: Continued Medical Work up   Patient Goals and CMS Choice Patient states their goals for this hospitalization and ongoing recovery are:: return home CMS Medicare.gov Compare Post Acute Care list provided to:: Patient Choice offered to / list presented to : Patient  Expected Discharge Plan and Services Expected Discharge Plan: [place] In-house Referral: Clinical Social Work   Post Acute Care Choice: Durable Medical Equipment Living arrangements for the past 2 months: Single Family Home                                      Prior Living Arrangements/Services Living arrangements for the past 2 months: Single Family Home Lives with:: Self Patient language and need for interpreter reviewed:: Yes Do you feel safe going back to the place where you live?: Yes      Need for Family Participation in Patient Care: No (Comment) Care giver support system  in place?: No (comment)   Criminal Activity/Legal Involvement Pertinent to Current Situation/Hospitalization: No - Comment as needed  Activities of Daily Living Home Assistive Devices/Equipment: Walker (specify type), Cane (specify quad or straight), Eyeglasses, Grab bars in shower, Grab bars around toilet, Raised toilet seat with rails, CPAP ADL Screening (condition at time of admission) Patient's cognitive ability adequate to safely complete daily activities?: Yes Is the patient deaf or have difficulty hearing?: No Does the patient have difficulty seeing, even when wearing glasses/contacts?: No Does the patient have difficulty concentrating, remembering, or making decisions?: Yes Patient able to express need for assistance with ADLs?: Yes Does the patient have difficulty dressing or bathing?: No Independently performs ADLs?: Yes (appropriate for developmental age) Does the patient have difficulty walking or climbing stairs?: Yes Weakness of Legs: Left Weakness of Arms/Hands: None  Permission Sought/Granted                  Emotional Assessment Appearance:: Appears stated age Attitude/Demeanor/Rapport: Engaged, Gracious Affect (typically observed): Accepting, Pleasant, Calm Orientation: : Oriented to Self, Oriented to Place, Oriented to  Time, Oriented to Situation Alcohol / Substance Use: Not Applicable Psych Involvement: No (comment)  Admission diagnosis:  Septic joint of left knee joint ([place]) [M00.9] Patient Active Problem List   Diagnosis Date Noted  . Septic joint of  left knee joint ([place]) 11/06/2019  . 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[place]) 08/15/2019  . Open knee wound 08/14/2019  . S/P left TK revision 04/03/2019  . Administration of long-term prophylactic antibiotics   . History of streptococcal infection   . History of DVT (deep vein thrombosis)   . S/P rev left TK 12/15/2015  . 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 Preoperative cardiovascular examination  04/17/2013  . OSA on CPAP 11/27/2012  . Chronic diastolic heart failure ([place]) 09/10/2012  . Chronic anticoagulation, with Xarelto 09/10/2012  . DVT (deep venous thrombosis), possible 09/10/2012  . SOB (shortness of breath) 08/26/2012  . Chest discomfort 08/26/2012  . Persistent atrial fibrillation ([place]) 08/26/2012  . Super obesity 06/27/2012  . Expected blood loss anemia 06/27/2012  . S/P left TK revision 06/23/2012  . Septic arthritis of knee ([place])   . Cellulitis 09/19/2010  . METHICILLIN RESISTANT STAPHYLOCOCCUS AUREUS INFECTION 09/10/2009  . 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 DM 07/04/2008  . CHRONIC KIDNEY DISEASE UNSPECIFIED 07/04/2008  . PULMONARY EMBOLISM, HX OF 07/04/2008  . INFECTION DUE TO INTERNAL ORTH DEVICE NEC 03/02/2006   PCP:  Bernerd Limbo, MD Pharmacy:   [facility], [address] [place] [address] Phone: [phone] Fax: [phone]     Social Determinants of Health (SDOH) Interventions    Readmission Risk Interventions No flowsheet data found.

## 2019-11-07 NOTE — Progress Notes (Signed)
° ° ° °  Subjective: 1 Day Post-Op Procedure(s) (LRB): IRRIGATION AND DEBRIDEMENT KNEE (Left)   Patient reports pain as moderate, controlled. No reported events throughout the night.  Dr. Alvan Dame discussed the procedure, findings and expectations moving forward.  Plan on PICC line.  Pharmacy consult on antibiotics.  OPAT order placed.    Objective:   VITALS:   Vitals:   11/07/19 0130 11/07/19 0613  BP: 114/77 (!) 144/91  Pulse: 87 88  Resp: 16 17  Temp:  97.8 F (36.6 C)  SpO2: 95% 99%    Dorsiflexion/Plantar flexion intact Incision: dressing C/D/I No cellulitis present Compartment soft  LABS Recent Labs    11/07/19 0313  HGB 8.4*  HCT 28.9*  WBC 6.6  PLT 316    Recent Labs    11/06/19 1349 11/07/19 0313  NA  --  136  K  --  4.5  BUN  --  20  CREATININE 1.15 0.99  GLUCOSE  --  175*     Assessment/Plan: 1 Day Post-Op Procedure(s) (LRB): IRRIGATION AND DEBRIDEMENT KNEE (Left)  Foley cath d/c'ed Advance diet Up with therapy Discharge home with home health when ready         Danae Orleans PA-C  [facility]   [place] [address], [address], [place], [place] [address] Phone: [phone] [email or web address]   [employer]

## 2019-11-07 NOTE — Progress Notes (Signed)
Physical Therapy Treatment Patient Details Name: [name] MRN: [id number] DOB: 1950-04-02 Today's Date: 11/07/2019    History of Present Illness Patient is 69 y.o. male s/p Lt TKR with I&D on 11/06/19. 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 He has had multiple revision arthroplasties, including resection with antibiotic spacers. He underwent TKR on 04/03/19 with 6 week antiobiotic treatment and then had I&D with polyexchange on 05/17/19 with wound vac placement. On 08/14/19 he had subsequent I&D. Other significant PMH includes A-fib, hypothyroidism, GERD, headaches, CHF.    PT Comments    Patient making good progress with acute PT. Pt increased distance for ambulation today and maintained safe step pattern and safe walker management. Pt denied increase in knee pain or fatigue throughout. Educated on gentle ROM exercise and quad strengthening exercises. He will continue to benefit from skilled PT to progress mobility and improve independence.    Follow Up Recommendations  Follow surgeon's recommendation for DC plan and follow-up therapies     Equipment Recommendations  None recommended by PT    Recommendations for Other Services       Precautions / Restrictions Precautions Precautions: Fall Restrictions Weight Bearing Restrictions: No LLE Weight Bearing: Weight bearing as tolerated Other Position/Activity Restrictions: WBAT    Mobility  Bed Mobility Overal bed mobility: Needs Assistance Bed Mobility: Supine to Sit     Supine to sit: HOB elevated;Supervision     General bed mobility comments: no assist required, pt using bed rail, takes extra time.  Transfers Overall transfer level: Needs assistance Equipment used: Rolling walker (2 wheeled) Transfers: Sit to/from Stand Sit to Stand: Min guard;Min assist         General transfer comment: light assist required  for power up, pt steady in standing.  Ambulation/Gait Ambulation/Gait assistance: Min guard Gait Distance (Feet): 180 Feet Assistive device: Rolling walker (2 wheeled) Gait Pattern/deviations: Step-to pattern;Decreased stride length;Decreased weight shift to left;Wide base of support Gait velocity: decr   General Gait Details: pt maintained good proximity to RW and safe step pattern. no overt LOB or buckling at Lt knee noted.    Stairs             Wheelchair Mobility    Modified Rankin (Stroke Patients Only)       Balance Overall balance assessment: Needs assistance Sitting-balance support: Feet supported Sitting balance-Leahy Scale: Good     Standing balance support: During functional activity;Bilateral upper extremity supported Standing balance-Leahy Scale: Fair                              Cognition Arousal/Alertness: Awake/alert Behavior During Therapy: WFL for tasks assessed/performed Overall Cognitive Status: Within Functional Limits for tasks assessed                                        Exercises Total Joint Exercises Ankle Circles/Pumps: AROM;Both;15 reps;Seated Quad Sets: AROM;Left;10 reps;Seated Heel Slides: AROM;Left;10 reps;Seated Long Arc Quad: AROM;Left;10 reps;Seated    General Comments        Pertinent Vitals/Pain Pain Assessment: 0-10 Pain Score: 5  Pain Location: Lt knee Pain Descriptors / Indicators: Aching;Discomfort;Sore Pain Intervention(s): Limited activity within patient's tolerance;Monitored during session;Ice applied;Repositioned;Premedicated before session    Home Living  Prior Function            PT Goals (current goals can now be found in the care plan section) Acute Rehab PT Goals Patient Stated Goal: return home and stay independent PT Goal Formulation: With patient Time For Goal Achievement: 11/13/19 Potential to Achieve Goals: Good Progress towards PT  goals: Progressing toward goals    Frequency    7X/week      PT Plan Current plan remains appropriate    Co-evaluation              AM-PAC PT "6 Clicks" Mobility   Outcome Measure  Help needed turning from your back to your side while in a flat bed without using bedrails?: None Help needed moving from lying on your back to sitting on the side of a flat bed without using bedrails?: A Little Help needed moving to and from a bed to a chair (including a wheelchair)?: A Little Help needed standing up from a chair using your arms (e.g., wheelchair or bedside chair)?: A Little Help needed to walk in hospital room?: A Little Help needed climbing 3-5 steps with a railing? : A Little 6 Click Score: 19    End of Session Equipment Utilized During Treatment: Gait belt Activity Tolerance: Patient tolerated treatment well Patient left: in chair;with call bell/phone within reach;with chair alarm set Nurse Communication: Mobility status PT Visit Diagnosis: Muscle weakness (generalized) (M62.81);Difficulty in walking, not elsewhere classified (R26.2)     Time: 7282-0601 PT Time Calculation (min) (ACUTE ONLY): 29 min  Charges:  $Gait Training: 8-22 mins $Therapeutic Exercise: 8-22 mins                     Verner Mould, DPT Acute Rehabilitation Services  Office [phone] Pager [phone]  11/07/2019 12:25 PM

## 2019-11-07 NOTE — Progress Notes (Signed)
Physical Therapy Treatment Patient Details Name: [name] MRN: [id number] DOB: Nov 01, 1950 Today's Date: 11/07/2019    History of Present Illness Patient is 69 y.o. male s/p Lt TKR with I&D on 11/06/19. Patient has history of left total knee arthroplasty in 0569 complicated by prosthetic joint infection, leading to multiple subsequent surgeries. He has had multiple revision arthroplasties, including resection with antibiotic spacers. He underwent TKR on 04/03/19 with 6 week antiobiotic treatment and then had I&D with polyexchange on 05/17/19 with wound vac placement. On 08/14/19 he had subsequent I&D. Other significant PMH includes A-fib, hypothyroidism, GERD, headaches, CHF.    PT Comments    Patient seen for additional session to progress gait and tolerated increased distance with no increase in pain. Reviewed exercises for circulation and and quad strengthening. He will continue to benefit from skilled PT interventions to progress independence with mobility. Acute will progress as able. Recommend HHPT follow up.     Follow Up Recommendations  Follow surgeon's recommendation for DC plan and follow-up therapies;Home health PT     Equipment Recommendations  None recommended by PT    Recommendations for Other Services       Precautions / Restrictions Precautions Precautions: Fall Restrictions Weight Bearing Restrictions: No LLE Weight Bearing: Weight bearing as tolerated Other Position/Activity Restrictions: WBAT    Mobility  Bed Mobility               General bed mobility comments: pt OOB in recliner at start and ended in recliner.   Transfers Overall transfer level: Needs assistance Equipment used: Rolling walker (2 wheeled) Transfers: Sit to/from Stand Sit to Stand: Min guard;Min assist         General transfer comment: light assist required for power up, pt steady in standing. increased assist needed to rise from toilet.  Ambulation/Gait Ambulation/Gait  assistance: Min guard Gait Distance (Feet): 210 Feet Assistive device: Rolling walker (2 wheeled) Gait Pattern/deviations: Step-to pattern;Decreased stride length;Decreased weight shift to left;Wide base of support;Step-through pattern Gait velocity: decr   General Gait Details: pt with safe use of RW during gait, no overt LOB noted throughout and pt steady. Pt progressed to step through pattern and stayed within RW for safety.    Stairs             Wheelchair Mobility    Modified Rankin (Stroke Patients Only)       Balance Overall balance assessment: Needs assistance Sitting-balance support: Feet supported Sitting balance-Leahy Scale: Good     Standing balance support: During functional activity;Bilateral upper extremity supported Standing balance-Leahy Scale: Fair                              Cognition Arousal/Alertness: Awake/alert Behavior During Therapy: WFL for tasks assessed/performed Overall Cognitive Status: Within Functional Limits for tasks assessed                                        Exercises Total Joint Exercises Ankle Circles/Pumps: AROM;Both;15 reps;Seated    General Comments        Pertinent Vitals/Pain Pain Assessment: 0-10 Pain Score: 4  Pain Location: Lt knee Pain Descriptors / Indicators: Aching;Discomfort;Sore Pain Intervention(s): Limited activity within patient's tolerance;Monitored during session;Repositioned;Ice applied;Premedicated before session    Home Living  Prior Function            PT Goals (current goals can now be found in the care plan section) Acute Rehab PT Goals Patient Stated Goal: return home and stay independent PT Goal Formulation: With patient Time For Goal Achievement: 11/13/19 Potential to Achieve Goals: Good Progress towards PT goals: Progressing toward goals    Frequency    7X/week      PT Plan Current plan remains appropriate     Co-evaluation              AM-PAC PT "6 Clicks" Mobility   Outcome Measure  Help needed turning from your back to your side while in a flat bed without using bedrails?: None Help needed moving from lying on your back to sitting on the side of a flat bed without using bedrails?: A Little Help needed moving to and from a bed to a chair (including a wheelchair)?: A Little Help needed standing up from a chair using your arms (e.g., wheelchair or bedside chair)?: A Little Help needed to walk in hospital room?: A Little Help needed climbing 3-5 steps with a railing? : A Little 6 Click Score: 19    End of Session Equipment Utilized During Treatment: Gait belt Activity Tolerance: Patient tolerated treatment well Patient left: in chair;with call bell/phone within reach;with chair alarm set Nurse Communication: Mobility status PT Visit Diagnosis: Muscle weakness (generalized) (M62.81);Difficulty in walking, not elsewhere classified (R26.2)     Time: 2725-3664 PT Time Calculation (min) (ACUTE ONLY): 19 min  Charges:  $Gait Training: 8-22 mins                     Verner Mould, DPT Acute Rehabilitation Services  Office [phone] Pager [phone]  11/07/2019 4:50 PM

## 2019-11-07 NOTE — Progress Notes (Addendum)
PHARMACY CONSULT NOTE FOR:  OUTPATIENT  PARENTERAL ANTIBIOTIC THERAPY (OPAT)  Indication: Prosthetic joint infection Regimen: Vancomycin 1250mg  IV q12h End date: 12/20/2019  IV antibiotic discharge orders are pended. To discharging provider:  please sign these orders via discharge navigator,  Select New Orders & click on the button choice - Manage This Unsigned Work.     Thank you for allowing pharmacy to be a part of this patient's care.  Gretta Arab PharmD, BCPS Clinical Pharmacist [facility] main pharmacy [phone] 11/07/2019 9:45 AM

## 2019-11-08 DIAGNOSIS — G4730 Sleep apnea, unspecified: Secondary | ICD-10-CM | POA: Diagnosis present

## 2019-11-08 DIAGNOSIS — Z79899 Other long term (current) drug therapy: Secondary | ICD-10-CM | POA: Diagnosis not present

## 2019-11-08 DIAGNOSIS — T8454XA Infection and inflammatory reaction due to internal left knee prosthesis, initial encounter: Secondary | ICD-10-CM | POA: Diagnosis not present

## 2019-11-08 DIAGNOSIS — L905 Scar conditions and fibrosis of skin: Secondary | ICD-10-CM | POA: Diagnosis present

## 2019-11-08 DIAGNOSIS — Z20822 Contact with and (suspected) exposure to covid-19: Secondary | ICD-10-CM | POA: Diagnosis not present

## 2019-11-08 DIAGNOSIS — E039 Hypothyroidism, unspecified: Secondary | ICD-10-CM | POA: Diagnosis not present

## 2019-11-08 DIAGNOSIS — Y831 Surgical operation with implant of artificial internal device as the cause of abnormal reaction of the patient, or of later complication, without mention of misadventure at the time of the procedure: Secondary | ICD-10-CM | POA: Diagnosis present

## 2019-11-08 DIAGNOSIS — K219 Gastro-esophageal reflux disease without esophagitis: Secondary | ICD-10-CM | POA: Diagnosis not present

## 2019-11-08 DIAGNOSIS — Z79890 Hormone replacement therapy (postmenopausal): Secondary | ICD-10-CM | POA: Diagnosis not present

## 2019-11-08 DIAGNOSIS — Z7901 Long term (current) use of anticoagulants: Secondary | ICD-10-CM | POA: Diagnosis not present

## 2019-11-08 DIAGNOSIS — I708 Atherosclerosis of other arteries: Secondary | ICD-10-CM | POA: Diagnosis present

## 2019-11-08 DIAGNOSIS — Z86718 Personal history of other venous thrombosis and embolism: Secondary | ICD-10-CM | POA: Diagnosis not present

## 2019-11-08 DIAGNOSIS — Z885 Allergy status to narcotic agent status: Secondary | ICD-10-CM | POA: Diagnosis not present

## 2019-11-08 DIAGNOSIS — Z8249 Family history of ischemic heart disease and other diseases of the circulatory system: Secondary | ICD-10-CM | POA: Diagnosis not present

## 2019-11-08 DIAGNOSIS — Z833 Family history of diabetes mellitus: Secondary | ICD-10-CM | POA: Diagnosis not present

## 2019-11-08 DIAGNOSIS — I4819 Other persistent atrial fibrillation: Secondary | ICD-10-CM | POA: Diagnosis not present

## 2019-11-08 DIAGNOSIS — M009 Pyogenic arthritis, unspecified: Secondary | ICD-10-CM | POA: Diagnosis not present

## 2019-11-08 LAB — BASIC METABOLIC PANEL
Anion gap: 7 (ref 5–15)
BUN: 18 mg/dL (ref 8–23)
CO2: 24 mmol/L (ref 22–32)
Calcium: 9.2 mg/dL (ref 8.9–10.3)
Chloride: 106 mmol/L (ref 98–111)
Creatinine, Ser: 0.88 mg/dL (ref 0.61–1.24)
GFR calc Af Amer: >60 mL/min (ref >60)
GFR calc non Af Amer: >60 mL/min (ref >60)
Glucose, Bld: 128 mg/dL — ABNORMAL HIGH (ref 70–99)
Potassium: 4.9 mmol/L (ref 3.5–5.1)
Sodium: 137 mmol/L (ref 135–145)

## 2019-11-08 LAB — CBC
HCT: 29.4 % — ABNORMAL LOW (ref 39.0–52.0)
Hemoglobin: 8.7 g/dL — ABNORMAL LOW (ref 13.0–17.0)
MCH: 29.9 pg (ref 26.0–34.0)
MCHC: 29.6 g/dL — ABNORMAL LOW (ref 30.0–36.0)
MCV: 101 fL — ABNORMAL HIGH (ref 80.0–100.0)
Platelets: 344 10*3/uL (ref 150–400)
RBC: 2.91 MIL/uL — ABNORMAL LOW (ref 4.22–5.81)
RDW: 15.5 % (ref 11.5–15.5)
WBC: 7.6 10*3/uL (ref 4.0–10.5)
nRBC: 0.3 % — ABNORMAL HIGH (ref 0.0–0.2)

## 2019-11-08 MED ORDER — SODIUM CHLORIDE 0.9 % IV SOLN
2.0000 g | INTRAVENOUS | Status: DC
  Administered 2019-11-08 – 2019-11-22 (×78): 2 g via INTRAVENOUS
  Filled 2019-11-08: qty 2000
  Filled 2019-11-08 (×3): qty 2
  Filled 2019-11-08: qty 2000
  Filled 2019-11-08: qty 2
  Filled 2019-11-08: qty 2000
  Filled 2019-11-08 (×2): qty 2
  Filled 2019-11-08: qty 2000
  Filled 2019-11-08 (×3): qty 2
  Filled 2019-11-08: qty 2000
  Filled 2019-11-08 (×2): qty 2
  Filled 2019-11-08 (×2): qty 2000
  Filled 2019-11-08: qty 2
  Filled 2019-11-08 (×4): qty 2000
  Filled 2019-11-08: qty 2
  Filled 2019-11-08 (×2): qty 2000
  Filled 2019-11-08 (×2): qty 2
  Filled 2019-11-08: qty 2000
  Filled 2019-11-08 (×2): qty 2
  Filled 2019-11-08: qty 2000
  Filled 2019-11-08: qty 2
  Filled 2019-11-08: qty 2000
  Filled 2019-11-08 (×2): qty 2
  Filled 2019-11-08 (×2): qty 2000
  Filled 2019-11-08 (×2): qty 2
  Filled 2019-11-08: qty 2000
  Filled 2019-11-08 (×7): qty 2
  Filled 2019-11-08 (×3): qty 2000
  Filled 2019-11-08: qty 2
  Filled 2019-11-08 (×3): qty 2000
  Filled 2019-11-08 (×2): qty 2
  Filled 2019-11-08 (×2): qty 2000
  Filled 2019-11-08 (×2): qty 2
  Filled 2019-11-08: qty 2000
  Filled 2019-11-08: qty 2
  Filled 2019-11-08 (×3): qty 2000
  Filled 2019-11-08 (×2): qty 2
  Filled 2019-11-08 (×3): qty 2000
  Filled 2019-11-08 (×2): qty 2
  Filled 2019-11-08: qty 2000
  Filled 2019-11-08 (×2): qty 2
  Filled 2019-11-08 (×3): qty 2000
  Filled 2019-11-08 (×2): qty 2
  Filled 2019-11-08: qty 2000
  Filled 2019-11-08: qty 2
  Filled 2019-11-08: qty 2000
  Filled 2019-11-08: qty 2
  Filled 2019-11-08 (×6): qty 2000

## 2019-11-08 MED ORDER — SODIUM CHLORIDE 0.9 % IV SOLN
2.0000 g | INTRAVENOUS | Status: DC
  Administered 2019-11-08: 2 g via INTRAVENOUS
  Filled 2019-11-08: qty 2
  Filled 2019-11-08: qty 2000

## 2019-11-08 NOTE — Plan of Care (Signed)

## 2019-11-08 NOTE — Progress Notes (Signed)
PHARMACY CONSULT NOTE FOR:  OUTPATIENT  PARENTERAL ANTIBIOTIC THERAPY (OPAT)  Indication: Prosthetic joint infection Regimen: Ampicillin 12 gm every 24 hours as a continuous infusion  End date: 12/20/2019  IV antibiotic discharge orders are pended. To discharging provider:  please sign these orders via discharge navigator,  Select New Orders & click on the button choice - Manage This Unsigned Work.     Thank you for allowing pharmacy to be a part of this patient's care.  Jimmy Footman, PharmD, BCPS, BCIDP Infectious Diseases Clinical Pharmacist Phone: [phone] 11/08/2019 2:13 PM

## 2019-11-08 NOTE — Progress Notes (Signed)
Patient ID: [name], male   DOB: 04-17-1950, 69 y.o.   MRN: [id number] Subjective: 2 Days Post-Op Procedure(s) (LRB): IRRIGATION AND DEBRIDEMENT KNEE (Left)    Patient reports pain as mild. No events  Objective:   VITALS:   Vitals:   11/07/19 2134 11/08/19 0538  BP: 114/70 (!) 126/93  Pulse: 89 73  Resp: 18 18  Temp: 98.5 F (36.9 C) (!) 97.5 F (36.4 C)  SpO2: 95% 96%    Neurovascular intact Incision: no drainage  Dressing removed today No active drainage at this point Redressed with guaze and ACE wrap   LABS Recent Labs    11/07/19 0313 11/08/19 0335  HGB 8.4* 8.7*  HCT 28.9* 29.4*  WBC 6.6 7.6  PLT 316 344    Recent Labs    11/06/19 1349 11/07/19 0313 11/08/19 0335  NA  --  136 137  K  --  4.5 4.9  BUN  --  20 18  CREATININE 1.15 0.99 0.88  GLUCOSE  --  175* 128*    No results for input(s): LABPT, INR in the last 72 hours.   Assessment/Plan: 2 Days Post-Op Procedure(s) (LRB): IRRIGATION AND DEBRIDEMENT KNEE (Left)   Advance diet Up with therapy Continue ABX therapy due to Culture taken of surgical site during joint revision Discharge home with home health   Will follow hs wound for drainage  Home IV antibiotic selection pending cultures and speciation  Needs PIC live prior to going home [facility] team working with for short discharge needs as wells as listening to his long term needs and providing assistance where applicable  D/C tomorrow verus Saturday depending on all the above

## 2019-11-08 NOTE — Progress Notes (Signed)
Physical Therapy Treatment Patient Details Name: [name] MRN: [id number] DOB: 10/16/1950 Today's Date: 11/08/2019    History of Present Illness Patient is 69 y.o. male s/p Lt TKR with I&D on 11/06/19. Patient has history of left total knee arthroplasty in 6226 complicated by prosthetic joint infection, leading to multiple subsequent surgeries. He has had multiple revision arthroplasties, including resection with antibiotic spacers. He underwent TKR on 04/03/19 with 6 week antiobiotic treatment and then had I&D with polyexchange on 05/17/19 with wound vac placement. On 08/14/19 he had subsequent I&D. Other significant PMH includes A-fib, hypothyroidism, GERD, headaches, CHF.    PT Comments    Pt continues motivated but expressing concerns regarding future AKA.   Follow Up Recommendations  Follow surgeons recommendation for DC plan and follow-up therapies;Home health PT     Equipment Recommendations  None recommended by PT    Recommendations for Other Services       Precautions / Restrictions Precautions Precautions: Fall Restrictions Weight Bearing Restrictions: Yes LLE Weight Bearing: Partial weight bearing    Mobility  Bed Mobility Overal bed mobility: Needs Assistance Bed Mobility: Supine to Sit     Supine to sit: HOB elevated;Supervision        Transfers Overall transfer level: Needs assistance Equipment used: Rolling walker (2 wheeled) Transfers: Sit to/from Stand Sit to Stand: Min guard         General transfer comment: steady assist only  Ambulation/Gait Ambulation/Gait assistance: Min guard Gait Distance (Feet): 250 Feet Assistive device: Rolling walker (2 wheeled) Gait Pattern/deviations: Step-to pattern;Decreased stride length;Decreased weight shift to left;Wide base of support;Step-through pattern Gait velocity: decr   General Gait Details: pt with safe use of RW during gait, no overt LOB noted throughout and pt steady. Pt progressed to step  through pattern and stayed within RW for safety.    Stairs             Wheelchair Mobility    Modified Rankin (Stroke Patients Only)       Balance Overall balance assessment: Needs assistance Sitting-balance support: Feet supported Sitting balance-Leahy Scale: Good     Standing balance support: During functional activity;Bilateral upper extremity supported Standing balance-Leahy Scale: Fair                              Cognition Arousal/Alertness: Awake/alert Behavior During Therapy: WFL for tasks assessed/performed Overall Cognitive Status: Within Functional Limits for tasks assessed                                        Exercises      General Comments        Pertinent Vitals/Pain Pain Assessment: 0-10 Pain Score: 4  Pain Location: Lt knee Pain Descriptors / Indicators: Aching;Discomfort;Sore Pain Intervention(s): Limited activity within patient's tolerance;Monitored during session;Premedicated before session;Ice applied    Home Living                      Prior Function            PT Goals (current goals can now be found in the care plan section) Acute Rehab PT Goals Patient Stated Goal: return home and stay independent PT Goal Formulation: With patient Time For Goal Achievement: 11/13/19 Potential to Achieve Goals: Good Progress towards PT goals: Progressing toward goals    Frequency  7X/week      PT Plan Current plan remains appropriate    Co-evaluation              AM-PAC PT "6 Clicks" Mobility   Outcome Measure  Help needed turning from your back to your side while in a flat bed without using bedrails?: None Help needed moving from lying on your back to sitting on the side of a flat bed without using bedrails?: A Little Help needed moving to and from a bed to a chair (including a wheelchair)?: A Little Help needed standing up from a chair using your arms (e.g., wheelchair or bedside  chair)?: A Little Help needed to walk in hospital room?: A Little Help needed climbing 3-5 steps with a railing? : A Little 6 Click Score: 19    End of Session Equipment Utilized During Treatment: Gait belt Activity Tolerance: Patient tolerated treatment well Patient left: in chair;with call bell/phone within reach;with chair alarm set Nurse Communication: Mobility status PT Visit Diagnosis: Muscle weakness (generalized) (M62.81);Difficulty in walking, not elsewhere classified (R26.2)     Time: 7670-1100 PT Time Calculation (min) (ACUTE ONLY): 25 min  Charges:  $Gait Training: 8-22 mins                     [place] Pager [phone] Office [phone]    Roshelle Traub 11/08/2019, 9:02 AM

## 2019-11-08 NOTE — Progress Notes (Signed)
Adriana Mccallum paged/ notified that IR orders are needed for PICC Line placement, IV team is unable to place PICC.

## 2019-11-08 NOTE — Progress Notes (Signed)
Physical Therapy Treatment Patient Details Name: [name] MRN: [id number] DOB: 08/07/1950 Today's Date: 11/08/2019    History of Present Illness Patient is 69 y.o. male s/p Lt TKR with I&D on 11/06/19. Patient has history of left total knee arthroplasty in 2951 complicated by prosthetic joint infection, leading to multiple subsequent surgeries. He has had multiple revision arthroplasties, including resection with antibiotic spacers. He underwent TKR on 04/03/19 with 6 week antiobiotic treatment and then had I&D with polyexchange on 05/17/19 with wound vac placement. On 08/14/19 he had subsequent I&D. Other significant PMH includes A-fib, hypothyroidism, GERD, headaches, CHF.    PT Comments    Pt continues very cooperative and pain well controlled but does state "The knee feels weaker and I'm getting a lot of strange sensations that I'm not used to".   Follow Up Recommendations  Follow surgeon's recommendation for DC plan and follow-up therapies;Home health PT     Equipment Recommendations  None recommended by PT    Recommendations for Other Services       Precautions / Restrictions Precautions Precautions: Fall Restrictions Weight Bearing Restrictions: Yes LLE Weight Bearing: Partial weight bearing Other Position/Activity Restrictions: WBAT    Mobility  Bed Mobility Overal bed mobility: Needs Assistance Bed Mobility: Sit to Supine       Sit to supine: Min assist   General bed mobility comments: assist for L LE only  Transfers Overall transfer level: Needs assistance Equipment used: Rolling walker (2 wheeled) Transfers: Sit to/from Stand Sit to Stand: Min guard         General transfer comment: steady assist only  Ambulation/Gait Ambulation/Gait assistance: Min guard Gait Distance (Feet): 250 Feet Assistive device: Rolling walker (2 wheeled) Gait Pattern/deviations: Step-to pattern;Decreased stride length;Decreased weight shift to left;Wide base of  support;Step-through pattern Gait velocity: decr   General Gait Details: pt with safe use of RW during gait, no overt LOB noted throughout and pt steady. Pt progressed to step through pattern and stayed within RW for safety.    Stairs             Wheelchair Mobility    Modified Rankin (Stroke Patients Only)       Balance Overall balance assessment: Needs assistance Sitting-balance support: Feet supported Sitting balance-Leahy Scale: Good     Standing balance support: During functional activity;Bilateral upper extremity supported Standing balance-Leahy Scale: Fair                              Cognition Arousal/Alertness: Awake/alert Behavior During Therapy: WFL for tasks assessed/performed Overall Cognitive Status: Within Functional Limits for tasks assessed                                        Exercises      General Comments        Pertinent Vitals/Pain Pain Assessment: 0-10 Pain Score: 4  Pain Location: Lt knee Pain Descriptors / Indicators: Aching;Discomfort;Sore Pain Intervention(s): Limited activity within patient's tolerance;Monitored during session;Premedicated before session;Ice applied    Home Living                      Prior Function            PT Goals (current goals can now be found in the care plan section) Acute Rehab PT Goals Patient Stated Goal: return home  and stay independent PT Goal Formulation: With patient Time For Goal Achievement: 11/13/19 Potential to Achieve Goals: Good Progress towards PT goals: Progressing toward goals    Frequency    7X/week      PT Plan Current plan remains appropriate    Co-evaluation              AM-PAC PT "6 Clicks" Mobility   Outcome Measure  Help needed turning from your back to your side while in a flat bed without using bedrails?: None Help needed moving from lying on your back to sitting on the side of a flat bed without using bedrails?:  A Little Help needed moving to and from a bed to a chair (including a wheelchair)?: A Little Help needed standing up from a chair using your arms (e.g., wheelchair or bedside chair)?: A Little Help needed to walk in hospital room?: A Little Help needed climbing 3-5 steps with a railing? : A Little 6 Click Score: 19    End of Session Equipment Utilized During Treatment: Gait belt Activity Tolerance: Patient tolerated treatment well Patient left: with call bell/phone within reach;in bed;with bed alarm set Nurse Communication: Mobility status PT Visit Diagnosis: Muscle weakness (generalized) (M62.81);Difficulty in walking, not elsewhere classified (R26.2)     Time: 2426-8341 PT Time Calculation (min) (ACUTE ONLY): 22 min  Charges:  $Gait Training: 8-22 mins                     [place] Pager [phone] Office [phone]    Eriberto Felch 11/08/2019, 3:06 PM

## 2019-11-09 ENCOUNTER — Inpatient Hospital Stay (HOSPITAL_COMMUNITY): Payer: Medicare Other

## 2019-11-09 LAB — BODY FLUID CULTURE

## 2019-11-09 MED ORDER — LIDOCAINE HCL 1 % IJ SOLN
INTRAMUSCULAR | Status: AC
  Filled 2019-11-09: qty 20

## 2019-11-09 MED ORDER — LIDOCAINE HCL 1 % IJ SOLN
INTRAMUSCULAR | Status: AC | PRN
  Administered 2019-11-09: 10 mL via INTRADERMAL

## 2019-11-09 MED ORDER — AMPICILLIN IV (FOR PTA / DISCHARGE USE ONLY): 12.0000 g | INTRAVENOUS | 0 refills | Status: AC

## 2019-11-09 NOTE — TOC Transition Note (Signed)
Transition of Care [facility]) - CM/SW Discharge Note   Patient Details  Name: [name] MRN: [id number] Date of Birth: Jul 16, 1950  [facility] CM/SW Contact:  Lennart Pall, LCSW Phone Number: 11/09/2019, 1:44 PM   Clinical Narrative:    Have reviewed dc referrals with pt and provided him with written information on ALF and ILF communities (future planning).  Anticipate dc tomorrow.  [employer] to provide IV abx teaching and home supply coverage.  [facility] to provide RN and PT.  No further TOC needs.   Final next level of care: [place] Barriers to Discharge: Continued Medical Work up   Patient Goals and CMS Choice Patient states their goals for this hospitalization and ongoing recovery are:: return home CMS Medicare.gov Compare Post Acute Care list provided to:: Patient Choice offered to / list presented to : Patient  Discharge Placement                       Discharge Plan and Services In-house Referral: Clinical Social Work   Post Acute Care Choice: Durable Medical Equipment                    HH Arranged: RN, PT, IV Antibiotics HH Agency: [facility], Other - See comment (and [employer]) Date HH Agency Contacted: 11/08/19 Time HH Agency Contacted: 1200 Representative spoke with at [place]: Carolynn Sayers with [employer];  Amy Hyatt with [facility] [place]  Social Determinants of Health (SDOH) Interventions     Readmission Risk Interventions Readmission Risk Prevention Plan 11/09/2019  Transportation Screening Complete  PCP or Specialist Appt within 5-7 Days Complete  Home Care Screening Complete  Medication Review (RN CM) Complete  Some recent data might be hidden

## 2019-11-09 NOTE — Plan of Care (Signed)

## 2019-11-09 NOTE — Progress Notes (Signed)
Physical Therapy Treatment Patient Details Name: [name] MRN: [id number] DOB: 15-Aug-1950 Today's Date: 11/09/2019    History of Present Illness Patient is 69 y.o. male s/p Lt TKR with I&D on 11/06/19. Patient has history of left total knee arthroplasty in 2725 complicated by prosthetic joint infection, leading to multiple subsequent surgeries. He has had multiple revision arthroplasties, including resection with antibiotic spacers. He underwent TKR on 04/03/19 with 6 week antiobiotic treatment and then had I&D with polyexchange on 05/17/19 with wound vac placement. On 08/14/19 he had subsequent I&D. Other significant PMH includes A-fib, hypothyroidism, GERD, headaches, CHF.    PT Comments    Pt continues cooperative and performing mobility tasks at approx same level but stating he is feeling a lot of different things from his L knee and does not really trust it anymore.    Follow Up Recommendations  Follow surgeon's recommendation for DC plan and follow-up therapies;Home health PT     Equipment Recommendations  None recommended by PT    Recommendations for Other Services       Precautions / Restrictions Precautions Precautions: Fall Restrictions Weight Bearing Restrictions: Yes LLE Weight Bearing: Partial weight bearing    Mobility  Bed Mobility               General bed mobility comments: Pt up in chair and requests back to same  Transfers Overall transfer level: Needs assistance Equipment used: Rolling walker (2 wheeled) Transfers: Sit to/from Stand Sit to Stand: Min guard         General transfer comment: steady assist only  Ambulation/Gait Ambulation/Gait assistance: Min guard Gait Distance (Feet): 250 Feet Assistive device: Rolling walker (2 wheeled) Gait Pattern/deviations: Step-to pattern;Decreased stride length;Decreased weight shift to left;Wide base of support;Step-through pattern Gait velocity: decr   General Gait Details: pt with safe use of  RW during gait, no overt LOB noted throughout and pt steady. Pt progressed to step through pattern and stayed within RW for safety.    Stairs             Wheelchair Mobility    Modified Rankin (Stroke Patients Only)       Balance Overall balance assessment: Needs assistance Sitting-balance support: Feet supported Sitting balance-Leahy Scale: Good     Standing balance support: During functional activity;Bilateral upper extremity supported Standing balance-Leahy Scale: Fair                              Cognition Arousal/Alertness: Awake/alert Behavior During Therapy: WFL for tasks assessed/performed Overall Cognitive Status: Within Functional Limits for tasks assessed                                        Exercises      General Comments        Pertinent Vitals/Pain Pain Assessment: 0-10 Pain Score: 5  Pain Location: Lt knee Pain Descriptors / Indicators: Aching;Discomfort;Sore;Burning Pain Intervention(s): Limited activity within patient's tolerance;Monitored during session;Premedicated before session;Ice applied    Home Living                      Prior Function            PT Goals (current goals can now be found in the care plan section) Acute Rehab PT Goals Patient Stated Goal: return home and stay independent PT  Goal Formulation: With patient Time For Goal Achievement: 11/13/19 Potential to Achieve Goals: Good Progress towards PT goals: Progressing toward goals    Frequency    7X/week      PT Plan Current plan remains appropriate    Co-evaluation              AM-PAC PT "6 Clicks" Mobility   Outcome Measure  Help needed turning from your back to your side while in a flat bed without using bedrails?: None Help needed moving from lying on your back to sitting on the side of a flat bed without using bedrails?: A Little Help needed moving to and from a bed to a chair (including a wheelchair)?: A  Little Help needed standing up from a chair using your arms (e.g., wheelchair or bedside chair)?: A Little Help needed to walk in hospital room?: A Little Help needed climbing 3-5 steps with a railing? : A Little 6 Click Score: 19    End of Session Equipment Utilized During Treatment: Gait belt Activity Tolerance: Patient tolerated treatment well Patient left: with call bell/phone within reach;in chair Nurse Communication: Mobility status PT Visit Diagnosis: Muscle weakness (generalized) (M62.81);Difficulty in walking, not elsewhere classified (R26.2)     Time: 4327-6147 PT Time Calculation (min) (ACUTE ONLY): 21 min  Charges:  $Gait Training: 8-22 mins                     [place] Pager [phone] Office [phone]    [name] 11/09/2019, 11:43 AM

## 2019-11-09 NOTE — Procedures (Signed)
Interventional Radiology Procedure Note  Procedure: Right arm PowerPICC, DL, 40 cm   Complications: None  Estimated Blood Loss: None  Recommendations: - Routine line care   Signed,  Criselda Peaches, MD

## 2019-11-09 NOTE — Progress Notes (Signed)
RT in to see pt. on floor for CPAP placement, RN made this RT aware they would be able to place on pt. Ffr h/s use and had already refilled humidifier with s/w, made aware to notify if needed.

## 2019-11-10 MED ORDER — CHLORHEXIDINE GLUCONATE CLOTH 2 % EX PADS
6.0000 | MEDICATED_PAD | Freq: Every day | CUTANEOUS | Status: DC
  Administered 2019-11-10 – 2019-11-22 (×10): 6 via TOPICAL

## 2019-11-10 NOTE — Progress Notes (Signed)
Physical Therapy Treatment Patient Details Name: [name] MRN: [id number] DOB: [birth year] Today's Date: 11/10/2019    History of Present Illness Patient is 69 y.o. male s/p Lt TKR with I&D on 11/06/19. Patient has history of left total knee arthroplasty in 5053 complicated by prosthetic joint infection, leading to multiple subsequent surgeries. He has had multiple revision arthroplasties, including resection with antibiotic spacers. He underwent TKR on 04/03/19 with 6 week antiobiotic treatment and then had I&D with polyexchange on 05/17/19 with wound vac placement. On 08/14/19 he had subsequent I&D. Other significant PMH includes A-fib, hypothyroidism, GERD, headaches, CHF.    PT Comments    Pt continues motivated and up to mobilize despite increased discomfort.  Pt largely IND but requires assist to bring L LE up onto bed.   Follow Up Recommendations  Follow surgeon's recommendation for DC plan and follow-up therapies;Home health PT     Equipment Recommendations  None recommended by PT    Recommendations for Other Services       Precautions / Restrictions Precautions Precautions: Fall Restrictions LLE Weight Bearing: Partial weight bearing Other Position/Activity Restrictions: WBAT    Mobility  Bed Mobility Overal bed mobility: Needs Assistance Bed Mobility: Sit to Supine       Sit to supine: Min assist   General bed mobility comments: assist to bring L LE into bed  Transfers Overall transfer level: Needs assistance Equipment used: Rolling walker (2 wheeled) Transfers: Sit to/from Stand Sit to Stand: Min guard;Supervision            Ambulation/Gait Ambulation/Gait assistance: Min Gaffer (Feet): 250 Feet Assistive device: Rolling walker (2 wheeled) Gait Pattern/deviations: Step-to pattern;Decreased stride length;Decreased weight shift to left;Wide base of support;Step-through pattern Gait velocity: decr   General Gait Details: pt  with safe use of RW during gait, no overt LOB noted throughout and pt steady. Pt progressed to step through pattern and stayed within RW for safety.    Stairs             Wheelchair Mobility    Modified Rankin (Stroke Patients Only)       Balance Overall balance assessment: Needs assistance Sitting-balance support: Feet supported Sitting balance-Leahy Scale: Good     Standing balance support: During functional activity;Bilateral upper extremity supported Standing balance-Leahy Scale: Fair                              Cognition Arousal/Alertness: Awake/alert Behavior During Therapy: WFL for tasks assessed/performed Overall Cognitive Status: Within Functional Limits for tasks assessed                                        Exercises      General Comments        Pertinent Vitals/Pain Pain Assessment: 0-10 Pain Score: 6  Pain Location: Lt knee Pain Descriptors / Indicators: Aching;Discomfort;Sore;Burning Pain Intervention(s): Limited activity within patient's tolerance;Monitored during session;Premedicated before session    Home Living                      Prior Function            PT Goals (current goals can now be found in the care plan section) Acute Rehab PT Goals Patient Stated Goal: return home and stay independent PT Goal Formulation: With patient Time For Goal  Achievement: 11/13/19 Potential to Achieve Goals: Good Progress towards PT goals: Progressing toward goals    Frequency    7X/week      PT Plan Current plan remains appropriate    Co-evaluation              AM-PAC PT "6 Clicks" Mobility   Outcome Measure  Help needed turning from your back to your side while in a flat bed without using bedrails?: None Help needed moving from lying on your back to sitting on the side of a flat bed without using bedrails?: A Little Help needed moving to and from a bed to a chair (including a  wheelchair)?: A Little Help needed standing up from a chair using your arms (e.g., wheelchair or bedside chair)?: A Little Help needed to walk in hospital room?: A Little Help needed climbing 3-5 steps with a railing? : A Little 6 Click Score: 19    End of Session Equipment Utilized During Treatment: Gait belt Activity Tolerance: Patient tolerated treatment well;Patient limited by fatigue Patient left: with call bell/phone within reach;in chair Nurse Communication: Mobility status PT Visit Diagnosis: Muscle weakness (generalized) (M62.81);Difficulty in walking, not elsewhere classified (R26.2)     Time: 6759-1638 PT Time Calculation (min) (ACUTE ONLY): 26 min  Charges:  $Gait Training: 8-22 mins                     [place] Pager [phone] Office [phone]    Candida Vetter 11/10/2019, 3:59 PM

## 2019-11-10 NOTE — Plan of Care (Signed)
  Problem: Education: Goal: Knowledge of General Education information will improve Description: Including pain rating scale, medication(s)/side effects and non-pharmacologic comfort measures Outcome: Progressing   Problem: Clinical Measurements: Goal: Will remain free from infection Outcome: Progressing   

## 2019-11-10 NOTE — Progress Notes (Signed)
Physical Therapy Treatment Patient Details Name: [name] MRN: [id number] DOB: December 21, 1950 Today's Date: 11/10/2019    History of Present Illness Patient is 69 y.o. male s/p Lt TKR with I&D on 11/06/19. Patient has history of left total knee arthroplasty in 5400 complicated by prosthetic joint infection, leading to multiple subsequent surgeries. He has had multiple revision arthroplasties, including resection with antibiotic spacers. He underwent TKR on 04/03/19 with 6 week antiobiotic treatment and then had I&D with polyexchange on 05/17/19 with wound vac placement. On 08/14/19 he had subsequent I&D. Other significant PMH includes A-fib, hypothyroidism, GERD, headaches, CHF.    PT Comments    Pt continues cooperative but c/o increased fatigue and expressing frustration with situation.   Follow Up Recommendations  Follow surgeon's recommendation for DC plan and follow-up therapies;Home health PT     Equipment Recommendations  None recommended by PT    Recommendations for Other Services       Precautions / Restrictions Precautions Precautions: Fall Restrictions Weight Bearing Restrictions: Yes LLE Weight Bearing: Partial weight bearing LLE Partial Weight Bearing Percentage or Pounds: 50% Other Position/Activity Restrictions: WBAT    Mobility  Bed Mobility               General bed mobility comments: Pt up in chair and requests back to same  Transfers Overall transfer level: Needs assistance Equipment used: Rolling walker (2 wheeled) Transfers: Sit to/from Stand Sit to Stand: Min guard         General transfer comment: steady assist only  Ambulation/Gait Ambulation/Gait assistance: Min guard Gait Distance (Feet): 200 Feet Assistive device: Rolling walker (2 wheeled) Gait Pattern/deviations: Step-to pattern;Decreased stride length;Decreased weight shift to left;Wide base of support;Step-through pattern Gait velocity: decr   General Gait Details: pt with safe  use of RW during gait, no overt LOB noted throughout and pt steady. Pt progressed to step through pattern and stayed within RW for safety.    Stairs             Wheelchair Mobility    Modified Rankin (Stroke Patients Only)       Balance Overall balance assessment: Needs assistance Sitting-balance support: Feet supported Sitting balance-Leahy Scale: Good     Standing balance support: During functional activity;Bilateral upper extremity supported Standing balance-Leahy Scale: Fair                              Cognition Arousal/Alertness: Awake/alert Behavior During Therapy: WFL for tasks assessed/performed Overall Cognitive Status: Within Functional Limits for tasks assessed                                        Exercises      General Comments        Pertinent Vitals/Pain Pain Assessment: 0-10 Pain Score: 5  Pain Location: Lt knee Pain Descriptors / Indicators: Aching;Discomfort;Sore;Burning Pain Intervention(s): Limited activity within patient's tolerance;Monitored during session;Premedicated before session;Ice applied    Home Living                      Prior Function            PT Goals (current goals can now be found in the care plan section) Acute Rehab PT Goals Patient Stated Goal: return home and stay independent PT Goal Formulation: With patient Time For Goal Achievement: 11/13/19  Potential to Achieve Goals: Good Progress towards PT goals: Progressing toward goals    Frequency    7X/week      PT Plan Current plan remains appropriate    Co-evaluation              AM-PAC PT "6 Clicks" Mobility   Outcome Measure  Help needed turning from your back to your side while in a flat bed without using bedrails?: None Help needed moving from lying on your back to sitting on the side of a flat bed without using bedrails?: A Little Help needed moving to and from a bed to a chair (including a  wheelchair)?: A Little Help needed standing up from a chair using your arms (e.g., wheelchair or bedside chair)?: A Little Help needed to walk in hospital room?: A Little Help needed climbing 3-5 steps with a railing? : A Little 6 Click Score: 19    End of Session Equipment Utilized During Treatment: Gait belt Activity Tolerance: Patient tolerated treatment well;Patient limited by fatigue Patient left: with call bell/phone within reach;in chair Nurse Communication: Mobility status PT Visit Diagnosis: Muscle weakness (generalized) (M62.81);Difficulty in walking, not elsewhere classified (R26.2)     Time: 5217-4715 PT Time Calculation (min) (ACUTE ONLY): 25 min  Charges:  $Gait Training: 8-22 mins                     Debe Coder PT [place] Pager [phone] Office [phone]    Han Vejar 11/10/2019, 12:25 PM

## 2019-11-10 NOTE — Progress Notes (Signed)
Subjective: 4 Days Post-Op Procedure(s) (LRB): IRRIGATION AND DEBRIDEMENT KNEE (Left) Patient seen in rounds this morning for Dr. Alvan Dame. Patient reports pain as moderate.   Patient states that his knee feels hot swollen Patient reports numbness in his left foot that has been pretty consistent since   Objective: Vital signs in last 24 hours: Temp:  [97.5 F (36.4 C)-98.3 F (36.8 C)] 97.9 F (36.6 C) (10/02 0817) Pulse Rate:  [82-106] 87 (10/02 0817) Resp:  [16-18] 17 (10/02 0817) BP: (109-144)/(74-98) 144/98 (10/02 0817) SpO2:  [97 %-100 %] 98 % (10/02 0817)  Intake/Output from previous day: 10/01 0701 - 10/02 0700 In: 1986.9 [P.O.:1170; I.V.:258.2; IV Piggyback:558.7] Out: 1300 [Urine:1300] Intake/Output this shift: No intake/output data recorded.  Recent Labs    11/08/19 0335  HGB 8.7*   Recent Labs    11/08/19 0335  WBC 7.6  RBC 2.91*  HCT 29.4*  PLT 344   Recent Labs    11/08/19 0335  NA 137  K 4.9  CL 106  CO2 24  BUN 18  CREATININE 0.88  GLUCOSE 128*  CALCIUM 9.2   No results for input(s): LABPT, INR in the last 72 hours.  On exam of his left leg wound VAC site looks good noticeable swelling of the left leg compared to the contralateral side.  It is warm to touch.  Limited range of motion of the left knee.  He is able dorsiflex plantarflex his left ankle with no pain.  Able move all toes.  Neurovascularly intact of left lower extremity.  Assessment/Plan: 4 Days Post-Op Procedure(s) (LRB): IRRIGATION AND DEBRIDEMENT KNEE (Left) Advance diet Up with therapy Continue ABX therapy due to Culture taken of surgical site during joint revision Discharge home with home health Waiting for home health to be set up for patient, IV antibiotics pending cultures Hopefully discharge home today, waiting for social work to see patient    Derrick Ravel [phone] 11/10/2019, 8:31 AM

## 2019-11-11 MED ORDER — SODIUM CHLORIDE 0.9% FLUSH
10.0000 mL | INTRAVENOUS | Status: DC | PRN
  Administered 2019-11-21: 10 mL

## 2019-11-11 MED ORDER — SODIUM CHLORIDE 0.9% FLUSH
10.0000 mL | Freq: Two times a day (BID) | INTRAVENOUS | Status: DC
  Administered 2019-11-11 – 2019-11-22 (×5): 10 mL

## 2019-11-11 NOTE — Plan of Care (Signed)
  Problem: Education: Goal: Knowledge of General Education information will improve Description: Including pain rating scale, medication(s)/side effects and non-pharmacologic comfort measures Outcome: Progressing   Problem: Clinical Measurements: Goal: Will remain free from infection Outcome: Progressing   

## 2019-11-11 NOTE — Progress Notes (Signed)
Pt. seen for CPAP, humidifier refilled with s/w, is able to place on independently, has been tolerating well, made aware to notify if needed.

## 2019-11-11 NOTE — Progress Notes (Signed)
Physical Therapy Treatment Patient Details Name: [name] MRN: [id number] DOB: 02-03-1951 Today's Date: 11/11/2019    History of Present Illness Patient is 69 y.o. male s/p Lt TKR with I&D on 11/06/19. Patient has history of left total knee arthroplasty in 8185 complicated by prosthetic joint infection, leading to multiple subsequent surgeries. He has had multiple revision arthroplasties, including resection with antibiotic spacers. He underwent TKR on 04/03/19 with 6 week antiobiotic treatment and then had I&D with polyexchange on 05/17/19 with wound vac placement. On 08/14/19 he had subsequent I&D. Other significant PMH includes A-fib, hypothyroidism, GERD, headaches, CHF.    PT Comments    Pt continues very cooperative and up to ambulate in halls this am.  Pt states pain slightly improved from yesterday but leg continues very heavy and with min knee flexion tolerated 2* edema.   Follow Up Recommendations  Follow surgeon's recommendation for DC plan and follow-up therapies;Home health PT     Equipment Recommendations  None recommended by PT    Recommendations for Other Services       Precautions / Restrictions Precautions Precautions: Fall Restrictions Weight Bearing Restrictions: Yes LLE Weight Bearing: Partial weight bearing LLE Partial Weight Bearing Percentage or Pounds: 50%    Mobility  Bed Mobility Overal bed mobility: Needs Assistance Bed Mobility: Supine to Sit     Supine to sit: HOB elevated;Supervision     General bed mobility comments: Pt utilizing bed rail to self assist to EOB sitting  Transfers Overall transfer level: Needs assistance Equipment used: Rolling walker (2 wheeled) Transfers: Sit to/from Stand Sit to Stand: Supervision            Ambulation/Gait Ambulation/Gait assistance: Min guard;Supervision Gait Distance (Feet): 250 Feet Assistive device: Rolling walker (2 wheeled) Gait Pattern/deviations: Step-to pattern;Decreased stride  length;Decreased weight shift to left;Wide base of support;Step-through pattern Gait velocity: decr   General Gait Details: pt with safe use of RW during gait, no overt LOB noted throughout and pt steady. Pt progressed to step through pattern and stayed within RW for safety.    Stairs             Wheelchair Mobility    Modified Rankin (Stroke Patients Only)       Balance Overall balance assessment: Needs assistance Sitting-balance support: Feet supported Sitting balance-Leahy Scale: Good     Standing balance support: During functional activity;Bilateral upper extremity supported Standing balance-Leahy Scale: Fair                              Cognition Arousal/Alertness: Awake/alert Behavior During Therapy: WFL for tasks assessed/performed Overall Cognitive Status: Within Functional Limits for tasks assessed                                        Exercises      General Comments        Pertinent Vitals/Pain Pain Assessment: 0-10 Pain Score: 5  Pain Location: Lt knee Pain Descriptors / Indicators: Aching;Discomfort;Sore;Burning Pain Intervention(s): Limited activity within patient's tolerance;Monitored during session;Premedicated before session    Home Living                      Prior Function            PT Goals (current goals can now be found in the care plan section) Acute  Rehab PT Goals Patient Stated Goal: return home and stay independent PT Goal Formulation: With patient Time For Goal Achievement: 11/13/19 Potential to Achieve Goals: Good Progress towards PT goals: Progressing toward goals    Frequency    7X/week      PT Plan Current plan remains appropriate    Co-evaluation              AM-PAC PT "6 Clicks" Mobility   Outcome Measure  Help needed turning from your back to your side while in a flat bed without using bedrails?: None Help needed moving from lying on your back to sitting on  the side of a flat bed without using bedrails?: A Little Help needed moving to and from a bed to a chair (including a wheelchair)?: A Little Help needed standing up from a chair using your arms (e.g., wheelchair or bedside chair)?: A Little Help needed to walk in hospital room?: A Little Help needed climbing 3-5 steps with a railing? : A Little 6 Click Score: 19    End of Session   Activity Tolerance: Patient tolerated treatment well Patient left: Other (comment) (in bathroom) Nurse Communication: Mobility status PT Visit Diagnosis: Muscle weakness (generalized) (M62.81);Difficulty in walking, not elsewhere classified (R26.2)     Time: 0211-1735 PT Time Calculation (min) (ACUTE ONLY): 20 min  Charges:  $Gait Training: 8-22 mins                     [place] Pager [phone] Office [phone]    Corinna Burkman 11/11/2019, 12:30 PM

## 2019-11-11 NOTE — Progress Notes (Signed)
     Subjective: 5 Days Post-Op Procedure(s) (LRB): IRRIGATION AND DEBRIDEMENT KNEE (Left)   Patient reports pain as moderate and feels the knee is swelling.  States that he had issues with PT yesterday and felt as if he was going to fall.  States that he only was able to get to the door and unsure of the left knee.  Questions the purpose of the antibiotic treatments as he has been dealing with the knee for many years and many surgeries.  States that he is ready for more definitive treatment.       Objective:   VITALS:   Vitals:   11/10/19 2056 11/11/19 0452  BP: 109/69 104/72  Pulse: (!) 110 100  Resp: 18 20  Temp: 98 F (36.7 C) 98.2 F (36.8 C)  SpO2: 99% 97%   Generalized swelling about the left knee Dorsiflexion/Plantar flexion intact Wound vac dressing in place with proper suction, no significant fluid in the canister No cellulitis present  LABS No results for input(s): HGB, HCT, WBC, PLT in the last 72 hours.  No results for input(s): NA, K, BUN, CREATININE, GLUCOSE in the last 72 hours.   Assessment/Plan: 5 Days Post-Op Procedure(s) (LRB): IRRIGATION AND DEBRIDEMENT KNEE (Left)   Up with therapy  Plan on keeping today to see if he progresses with PT. Discussed talking with Dr. Alvan Dame regarding future expectations.         Danae Orleans PA-C  [facility]  [place] [address] [place], [place], [place] [address] Phone: [phone] [email or web address]  [employer]

## 2019-11-12 LAB — ANAEROBIC CULTURE

## 2019-11-12 NOTE — Progress Notes (Signed)
Physical Therapy Treatment Patient Details Name: [name] MRN: [id number] DOB: 03-Jun-1950 Today's Date: 11/12/2019    History of Present Illness Patient is 69 y.o. male s/p Lt TKR with I&D on 11/06/19. Patient has history of left total knee arthroplasty in 0737 complicated by prosthetic joint infection, leading to multiple subsequent surgeries. He has had multiple revision arthroplasties, including resection with antibiotic spacers. He underwent TKR on 04/03/19 with 6 week antiobiotic treatment and then had I&D with polyexchange on 05/17/19 with wound vac placement. On 08/14/19 he had subsequent I&D. Other significant PMH includes A-fib, hypothyroidism, GERD, headaches, CHF.    PT Comments    Pt requested therapist return to ambulate again if time.  Pt remains pleasant and always willing to mobilize.  Pt awaiting discussion with MD about plan of care.   Follow Up Recommendations  Follow surgeon's recommendation for DC plan and follow-up therapies;Home health PT     Equipment Recommendations  None recommended by PT    Recommendations for Other Services       Precautions / Restrictions Precautions Precautions: Fall Precaution Comments: NPWT L knee Restrictions Weight Bearing Restrictions: Yes LLE Weight Bearing: Partial weight bearing LLE Partial Weight Bearing Percentage or Pounds: 50%    Mobility  Bed Mobility Overal bed mobility: Needs Assistance Bed Mobility: Supine to Sit     Supine to sit: Supervision;HOB elevated Sit to supine: Min assist   General bed mobility comments: assist for L LE  Transfers Overall transfer level: Needs assistance Equipment used: Rolling walker (2 wheeled) Transfers: Sit to/from Stand Sit to Stand: Supervision         General transfer comment: supervision for safety  Ambulation/Gait Ambulation/Gait assistance: Supervision Gait Distance (Feet): 220 Feet Assistive device: Rolling walker (2 wheeled) Gait Pattern/deviations:  Decreased stride length;Decreased weight shift to left;Wide base of support;Step-through pattern     General Gait Details: pt steady with RW, required 2 standing short rest breaks, pt also a little shaky with fatigue   Stairs             Wheelchair Mobility    Modified Rankin (Stroke Patients Only)       Balance                                            Cognition Arousal/Alertness: Awake/alert Behavior During Therapy: WFL for tasks assessed/performed Overall Cognitive Status: Within Functional Limits for tasks assessed                                        Exercises      General Comments        Pertinent Vitals/Pain Pain Assessment: 0-10 Pain Score: 5  Pain Location: Lt knee Pain Descriptors / Indicators: Aching;Discomfort;Burning Pain Intervention(s): Repositioned;Monitored during session    Home Living                      Prior Function            PT Goals (current goals can now be found in the care plan section) Progress towards PT goals: Progressing toward goals    Frequency    7X/week      PT Plan Current plan remains appropriate    Co-evaluation  AM-PAC PT "6 Clicks" Mobility   Outcome Measure  Help needed turning from your back to your side while in a flat bed without using bedrails?: None Help needed moving from lying on your back to sitting on the side of a flat bed without using bedrails?: A Little Help needed moving to and from a bed to a chair (including a wheelchair)?: A Little Help needed standing up from a chair using your arms (e.g., wheelchair or bedside chair)?: A Little Help needed to walk in hospital room?: A Little Help needed climbing 3-5 steps with a railing? : A Little 6 Click Score: 19    End of Session Equipment Utilized During Treatment: Gait belt Activity Tolerance: Patient tolerated treatment well Patient left: in bed;with call bell/phone within  reach;with nursing/sitter in room;with bed alarm set Nurse Communication: Mobility status PT Visit Diagnosis: Muscle weakness (generalized) (M62.81);Difficulty in walking, not elsewhere classified (R26.2)     Time: 8882-8003 PT Time Calculation (min) (ACUTE ONLY): 16 min  Charges:  $Gait Training: 8-22 mins                    Arlyce Dice, DPT Acute Rehabilitation Services Pager: [phone] Office: [phone]  York Ram E 11/12/2019, 3:27 PM

## 2019-11-12 NOTE — Care Management Important Message (Signed)
Important Message  Patient Details IM Letter given to the Patient Name: [name] MRN: [id number] Date of Birth: 1950/08/13   Medicare Important Message Given:  Yes     [name] 11/12/2019, 2:38 PM

## 2019-11-12 NOTE — Progress Notes (Signed)
     Subjective: 6 Days Post-Op Procedure(s) (LRB): IRRIGATION AND DEBRIDEMENT KNEE (Left)   Patient reports pain as moderate, concerned that the leg is going to give out on him during PT. Doesn't understand why he is continuing with abx treatment and continues to ask what the plan is moving forward.     Objective:   VITALS:   Vitals:   11/11/19 2138 11/12/19 0503  BP: 113/68 116/80  Pulse: 84 81  Resp: 18 18  Temp: 98.7 F (37.1 C) (!) 97.3 F (36.3 C)  SpO2: 96% 95%    Left knee: Swelling Pain Wound vac appears to be pulling a small amount of fluid with proper suction     LABS No results for input(s): HGB, HCT, WBC, PLT in the last 72 hours.  No results for input(s): NA, K, BUN, CREATININE, GLUCOSE in the last 72 hours.   Assessment/Plan: 6 Days Post-Op Procedure(s) (LRB): IRRIGATION AND DEBRIDEMENT KNEE (Left)    Unsafe to d/c home yet, as he is unstable with PT Plan on continued use of abx Plan on continued PT to work on safety of ambulation       Danae Orleans PA-C  [facility] [address], [address], [place], [place] [address] Phone: [phone] [email or web address] Facebook  [employer]

## 2019-11-12 NOTE — Progress Notes (Signed)
Physical Therapy Treatment Patient Details Name: [name] MRN: [id number] DOB: 24-Jul-1950 Today's Date: 11/12/2019    History of Present Illness Patient is 69 y.o. male s/p Lt TKR with I&D on 11/06/19. Patient has history of left total knee arthroplasty in 5885 complicated by prosthetic joint infection, leading to multiple subsequent surgeries. He has had multiple revision arthroplasties, including resection with antibiotic spacers. He underwent TKR on 04/03/19 with 6 week antiobiotic treatment and then had I&D with polyexchange on 05/17/19 with wound vac placement. On 08/14/19 he had subsequent I&D. Other significant PMH includes A-fib, hypothyroidism, GERD, headaches, CHF.    PT Comments    Pt appears depressed today and talking about how he likely needs an amputation.  Pt very agreeable to mobilize however and ambulated in hallway.  Rest breaks encouraged as pt fatigues quickly and has pain.    Follow Up Recommendations  Follow surgeon's recommendation for DC plan and follow-up therapies;Home health PT     Equipment Recommendations  None recommended by PT    Recommendations for Other Services       Precautions / Restrictions Precautions Precautions: Fall Precaution Comments: NPWT L knee Restrictions Weight Bearing Restrictions: Yes LLE Weight Bearing: Partial weight bearing    Mobility  Bed Mobility Overal bed mobility: Needs Assistance Bed Mobility: Sit to Supine       Sit to supine: Min assist   General bed mobility comments: assist for L LE; (pt up in bathroom on arrival)  Transfers Overall transfer level: Needs assistance Equipment used: Rolling walker (2 wheeled) Transfers: Sit to/from Stand Sit to Stand: Supervision         General transfer comment: supervision for safety  Ambulation/Gait Ambulation/Gait assistance: Min guard;Supervision Gait Distance (Feet): 160 Feet Assistive device: Rolling walker (2 wheeled) Gait Pattern/deviations: Decreased  stride length;Decreased weight shift to left;Wide base of support;Step-through pattern     General Gait Details: pt steady with RW, required 3 standing short rest breaks, pt also a little shaky with fatigue; pt states he has always been one to push through so encouraged taking breaks as needed   Stairs             Wheelchair Mobility    Modified Rankin (Stroke Patients Only)       Balance                                            Cognition Arousal/Alertness: Awake/alert Behavior During Therapy: WFL for tasks assessed/performed Overall Cognitive Status: Within Functional Limits for tasks assessed                                        Exercises      General Comments        Pertinent Vitals/Pain Pain Assessment: 0-10 Pain Score: 5  Pain Location: Lt knee Pain Descriptors / Indicators: Aching;Discomfort;Burning Pain Intervention(s): Monitored during session;Repositioned;Ice applied    Home Living                      Prior Function            PT Goals (current goals can now be found in the care plan section) Progress towards PT goals: Progressing toward goals    Frequency    7X/week  PT Plan Current plan remains appropriate    Co-evaluation              AM-PAC PT "6 Clicks" Mobility   Outcome Measure  Help needed turning from your back to your side while in a flat bed without using bedrails?: None Help needed moving from lying on your back to sitting on the side of a flat bed without using bedrails?: A Little Help needed moving to and from a bed to a chair (including a wheelchair)?: A Little Help needed standing up from a chair using your arms (e.g., wheelchair or bedside chair)?: A Little Help needed to walk in hospital room?: A Little Help needed climbing 3-5 steps with a railing? : A Little 6 Click Score: 19    End of Session Equipment Utilized During Treatment: Gait belt Activity  Tolerance: Patient tolerated treatment well Patient left: in bed;with call bell/phone within reach;with nursing/sitter in room Nurse Communication: Mobility status PT Visit Diagnosis: Muscle weakness (generalized) (M62.81);Difficulty in walking, not elsewhere classified (R26.2)     Time: 4718-5501 PT Time Calculation (min) (ACUTE ONLY): 19 min  Charges:  $Gait Training: 8-22 mins                    Arlyce Dice, DPT Acute Rehabilitation Services Pager: [phone] Office: [phone]  [place] Ram E 11/12/2019, 1:47 PM

## 2019-11-13 NOTE — Plan of Care (Signed)
  Problem: Education: Goal: Knowledge of General Education information will improve Description Including pain rating scale, medication(s)/side effects and non-pharmacologic comfort measures Outcome: Progressing   

## 2019-11-13 NOTE — Progress Notes (Signed)
Physical Therapy Treatment Patient Details Name: [name] MRN: [id number] DOB: 1950-02-19 Today's Date: 11/13/2019    History of Present Illness Patient is 69 y.o. male s/p Lt TKR with I&D on 11/06/19. Patient has history of left total knee arthroplasty in 4098 complicated by prosthetic joint infection, leading to multiple subsequent surgeries. He has had multiple revision arthroplasties, including resection with antibiotic spacers. He underwent TKR on 04/03/19 with 6 week antiobiotic treatment and then had I&D with polyexchange on 05/17/19 with wound vac placement. On 08/14/19 he had subsequent I&D. Other significant PMH includes A-fib, hypothyroidism, GERD, headaches, CHF.    PT Comments    incr gait distance/tolerance  this pm. Pt is always pleasant, motivated and willing to work with PT. Continue to follow in acute setting.  Follow Up Recommendations  Follow surgeon's recommendation for DC plan and follow-up therapies;Home health PT     Equipment Recommendations  None recommended by PT    Recommendations for Other Services       Precautions / Restrictions Precautions Precautions: Fall Precaution Comments: NPWT L knee Restrictions LLE Weight Bearing: Partial weight bearing LLE Partial Weight Bearing Percentage or Pounds: 50%    Mobility  Bed Mobility   Bed Mobility: Sit to Supine       Sit to supine: Min assist   General bed mobility comments: assist for L LE  Transfers Overall transfer level: Needs assistance Equipment used: Rolling walker (2 wheeled) Transfers: Sit to/from Stand Sit to Stand: Supervision         General transfer comment: supervision for safety, uses momentum sto stand   Ambulation/Gait Ambulation/Gait assistance: Supervision Gait Distance (Feet): 180 Feet Assistive device: Rolling walker (2 wheeled) Gait Pattern/deviations: Decreased stride length;Decreased weight shift to left;Wide base of support;Step-through pattern Gait velocity:  decr   General Gait Details:  no standing rest needed,  however pt with  incr WOB/fatigue  after ~ 80'   Stairs             Wheelchair Mobility    Modified Rankin (Stroke Patients Only)       Balance             Standing balance-Leahy Scale: Fair Standing balance comment: able to stand and don mask with both hands, close supervision for safety                             Cognition Arousal/Alertness: Awake/alert Behavior During Therapy: WFL for tasks assessed/performed Overall Cognitive Status: Within Functional Limits for tasks assessed                                        Exercises Total Joint Exercises Ankle Circles/Pumps:  (pt performing on his own, return demo's)    General Comments        Pertinent Vitals/Pain Pain Assessment: 0-10 Pain Score: 4  Pain Location: Lt knee Pain Descriptors / Indicators: Aching;Discomfort;Burning    Home Living                      Prior Function            PT Goals (current goals can now be found in the care plan section) Acute Rehab PT Goals Patient Stated Goal: return home and stay independent PT Goal Formulation: With patient Time For Goal Achievement: 11/13/19 Potential to Achieve  Goals: Good Progress towards PT goals: Progressing toward goals    Frequency    7X/week      PT Plan Current plan remains appropriate    Co-evaluation              AM-PAC PT "6 Clicks" Mobility   Outcome Measure  Help needed turning from your back to your side while in a flat bed without using bedrails?: None Help needed moving from lying on your back to sitting on the side of a flat bed without using bedrails?: A Little Help needed moving to and from a bed to a chair (including a wheelchair)?: A Little Help needed standing up from a chair using your arms (e.g., wheelchair or bedside chair)?: A Little Help needed to walk in hospital room?: A Little Help needed climbing 3-5  steps with a railing? : A Little 6 Click Score: 19    End of Session Equipment Utilized During Treatment: Gait belt Activity Tolerance: Patient tolerated treatment well Patient left: in bed;with call bell/phone within reach   PT Visit Diagnosis: Muscle weakness (generalized) (M62.81);Difficulty in walking, not elsewhere classified (R26.2)     Time: 6701-4103 PT Time Calculation (min) (ACUTE ONLY): 12 min  Charges:  $Gait Training: 8-22 mins                     Baxter Flattery, PT  Acute Rehab Dept ([place]) [phone] Pager [phone]  11/13/2019    [facility] 11/13/2019, 4:41 PM

## 2019-11-13 NOTE — Plan of Care (Signed)

## 2019-11-13 NOTE — Progress Notes (Signed)
Physical Therapy Treatment Patient Details Name: [name] MRN: [id number] DOB: 1951/01/03 Today's Date: 11/13/2019    History of Present Illness Patient is 69 y.o. male s/p Lt TKR with I&D on 11/06/19. Patient has history of left total knee arthroplasty in 6237 complicated by prosthetic joint infection, leading to multiple subsequent surgeries. He has had multiple revision arthroplasties, including resection with antibiotic spacers. He underwent TKR on 04/03/19 with 6 week antiobiotic treatment and then had I&D with polyexchange on 05/17/19 with wound vac placement. On 08/14/19 he had subsequent I&D. Other significant PMH includes A-fib, hypothyroidism, GERD, headaches, CHF.    PT Comments    Pt progressing toward goals. Frustrated about uncertainty of plan. He is hopeful to see Dr. Alvan Dame today. Continue to follow in acute setting.   Follow Up Recommendations  Follow surgeon's recommendation for DC plan and follow-up therapies;Home health PT     Equipment Recommendations  None recommended by PT    Recommendations for Other Services       Precautions / Restrictions Precautions Precautions: Fall Precaution Comments: NPWT L knee Restrictions LLE Weight Bearing: Partial weight bearing LLE Partial Weight Bearing Percentage or Pounds: 50%    Mobility  Bed Mobility   Bed Mobility: Sit to Supine       Sit to supine: Min assist   General bed mobility comments: assist for L LE  Transfers Overall transfer level: Needs assistance Equipment used: Rolling walker (2 wheeled) Transfers: Sit to/from Stand Sit to Stand: Supervision         General transfer comment: supervision for safety, uses momentum sto stand   Ambulation/Gait Ambulation/Gait assistance: Supervision Gait Distance (Feet): 170 Feet Assistive device: Rolling walker (2 wheeled) Gait Pattern/deviations: Decreased stride length;Decreased weight shift to left;Wide base of support;Step-through pattern Gait  velocity: decr   General Gait Details: slow but steady gait with RW, incr pain with incr distance but tolerable per pt. no standing rest needed,  however noted incr WOB after ~ 60'   Stairs             Wheelchair Mobility    Modified Rankin (Stroke Patients Only)       Balance                                            Cognition Arousal/Alertness: Awake/alert Behavior During Therapy: WFL for tasks assessed/performed Overall Cognitive Status: Within Functional Limits for tasks assessed                                        Exercises Total Joint Exercises Ankle Circles/Pumps:  (pt performing on his own, return demo's)    General Comments        Pertinent Vitals/Pain Pain Assessment: 0-10 Pain Score: 5  Pain Location: Lt knee Pain Descriptors / Indicators: Aching;Discomfort;Burning Pain Intervention(s): Limited activity within patient's tolerance;Monitored during session;Premedicated before session;Repositioned    Home Living                      Prior Function            PT Goals (current goals can now be found in the care plan section) Acute Rehab PT Goals Patient Stated Goal: return home and stay independent PT Goal Formulation: With patient Time For  Goal Achievement: 11/13/19 Potential to Achieve Goals: Good Progress towards PT goals: Progressing toward goals    Frequency    7X/week      PT Plan Current plan remains appropriate    Co-evaluation              AM-PAC PT "6 Clicks" Mobility   Outcome Measure  Help needed turning from your back to your side while in a flat bed without using bedrails?: None Help needed moving from lying on your back to sitting on the side of a flat bed without using bedrails?: A Little Help needed moving to and from a bed to a chair (including a wheelchair)?: A Little Help needed standing up from a chair using your arms (e.g., wheelchair or bedside chair)?: A  Little Help needed to walk in hospital room?: A Little Help needed climbing 3-5 steps with a railing? : A Little 6 Click Score: 19    End of Session   Activity Tolerance: Patient tolerated treatment well Patient left: in bed;with call bell/phone within reach   PT Visit Diagnosis: Muscle weakness (generalized) (M62.81);Difficulty in walking, not elsewhere classified (R26.2)     Time: 9826-4158 PT Time Calculation (min) (ACUTE ONLY): 14 min  Charges:  $Gait Training: 8-22 mins                     Baxter Flattery, PT  Acute Rehab Dept ([place]) [phone] Pager [phone]  11/13/2019    [facility] 11/13/2019, 11:10 AM

## 2019-11-14 NOTE — Progress Notes (Signed)
Physical Therapy Treatment Patient Details Name: [name] MRN: [id number] DOB: March 23, 1950 Today's Date: 11/14/2019    History of Present Illness Patient is 69 y.o. male s/p Lt TKR with I&D on 11/06/19. Patient has history of left total knee arthroplasty in 9381 complicated by prosthetic joint infection, leading to multiple subsequent surgeries. He has had multiple revision arthroplasties, including resection with antibiotic spacers. He underwent TKR on 04/03/19 with 6 week antiobiotic treatment and then had I&D with polyexchange on 05/17/19 with wound vac placement. On 08/14/19 he had subsequent I&D. Other significant PMH includes A-fib, hypothyroidism, GERD, headaches, CHF.    PT Comments    POD # 8 Assisted OOB to amb.  General bed mobility comments: self able OOB but assist for L LE up onto bed.  General transfer comment: Supervision for safet.  prefers to use forward momentum with hands on walker vs push up due to body habitus.  General Gait Details: tolerating a functional distance but difficult to achieve true 50% WB due to BMI  Pt shared "they are going to take my leg.  I don't want you to feel sorry for me.  I have been through so much with this leg I and think it's best.  I am coming to grips and just want to get it done".    Follow Up Recommendations  Follow surgeon's recommendation for DC plan and follow-up therapies;Home health PT     Equipment Recommendations  None recommended by PT    Recommendations for Other Services       Precautions / Restrictions Precautions Precautions: Fall Precaution Comments: NPWT L knee Restrictions Weight Bearing Restrictions: Yes LLE Weight Bearing: Partial weight bearing LLE Partial Weight Bearing Percentage or Pounds: 50%    Mobility  Bed Mobility Overal bed mobility: Needs Assistance Bed Mobility: Supine to Sit;Sit to Supine     Supine to sit: Supervision;HOB elevated Sit to supine: Min assist   General bed mobility  comments: self able OOB but assist for L LE up onto bed  Transfers Overall transfer level: Needs assistance Equipment used: Rolling walker (2 wheeled) Transfers: Sit to/from [facility] Sit to Stand: Supervision Stand pivot transfers: Supervision       General transfer comment: Supervision for safet.  prefers to use forward momentum with hands on walker vs push up due to body habitus  Ambulation/Gait Ambulation/Gait assistance: Supervision Gait Distance (Feet): 210 Feet Assistive device: Rolling walker (2 wheeled) Gait Pattern/deviations: Decreased stride length;Decreased weight shift to left;Wide base of support;Step-through pattern Gait velocity: decreased   General Gait Details: tolerating a functional distance but difficult to achieve true 50% WB due to BMI   Stairs             Wheelchair Mobility    Modified Rankin (Stroke Patients Only)       Balance                                            Cognition Arousal/Alertness: Awake/alert Behavior During Therapy: WFL for tasks assessed/performed Overall Cognitive Status: Within Functional Limits for tasks assessed                                 General Comments: AxO x 3 very pleasant "been through alot" multiple issues with this knee  Exercises      General Comments        Pertinent Vitals/Pain Pain Assessment: 0-10 Pain Score: 4  Pain Location: L knee  "feel hot" Pain Descriptors / Indicators: Burning Pain Intervention(s): Monitored during session;Repositioned    Home Living                      Prior Function            PT Goals (current goals can now be found in the care plan section) Progress towards PT goals: Progressing toward goals    Frequency    7X/week      PT Plan Current plan remains appropriate    Co-evaluation              AM-PAC PT "6 Clicks" Mobility   Outcome Measure  Help needed turning from  your back to your side while in a flat bed without using bedrails?: None Help needed moving from lying on your back to sitting on the side of a flat bed without using bedrails?: A Little Help needed moving to and from a bed to a chair (including a wheelchair)?: A Little Help needed standing up from a chair using your arms (e.g., wheelchair or bedside chair)?: A Little Help needed to walk in hospital room?: A Little Help needed climbing 3-5 steps with a railing? : A Little 6 Click Score: 19    End of Session Equipment Utilized During Treatment: Gait belt Activity Tolerance: Patient tolerated treatment well Patient left: in bed;with call bell/phone within reach Nurse Communication: Mobility status PT Visit Diagnosis: Muscle weakness (generalized) (M62.81);Difficulty in walking, not elsewhere classified (R26.2)     Time: 8016-5537 PT Time Calculation (min) (ACUTE ONLY): 19 min  Charges:  $Gait Training: 8-22 mins                     {Trace Wirick  PTA Acute  Rehabilitation [employer]      [phone] Office      [phone]

## 2019-11-14 NOTE — Progress Notes (Signed)
° ° ° °  Subjective: 8 Days Post-Op Procedure(s) (LRB): IRRIGATION AND DEBRIDEMENT KNEE (Left)   Patient reports pain as moderate.  Dr. Alvan Dame discussed the case with Dr. Sharol Given and the patient yesterday and it is agreed upon for the patient to be transferred to [facility] for Dr. Sharol Given to amputate the left leg above the knee on Friday.  The patient is in agreement with this plan as he is not wanting to deal with the knee and infection any longer. This has been confirmed with the patient many times and he has not changed his mind. He realizes that this will change his life significantly, but he says he doesn't have much of a life dealing with the knee in the current state.      Objective:   VITALS:   Vitals:   11/13/19 2057 11/14/19 0447  BP: 110/68 110/84  Pulse: 78 93  Resp: 18 18  Temp: 98.5 F (36.9 C) 98.5 F (36.9 C)  SpO2: 94% 95%    Left knee swelling. Drainage into the wound vac sponge   LABS No results for input(s): HGB, HCT, WBC, PLT in the last 72 hours.  No results for input(s): NA, K, BUN, CREATININE, GLUCOSE in the last 72 hours.   Assessment/Plan: 8 Days Post-Op Procedure(s) (LRB): IRRIGATION AND DEBRIDEMENT KNEE (Left)   Plan on transferring the patient to [facility]. Plan is for Dr. Sharol Given to preform a AKA on the left leg on Friday         Danae Orleans PA-C  [facility]   [place] [address], [place], [place], [place] [address] Phone: [phone] [email or web address] Facebook   [employer]

## 2019-11-14 NOTE — Progress Notes (Signed)
     Subjective: 7 Days Post-Op Procedure(s) (LRB): IRRIGATION AND DEBRIDEMENT KNEE (Left)   Patient reports pain as moderate, Difficulty sleeping last night.  Wondering what the plan is regarding the knee.  Doesn't feel that the knee is stable while working with PT, felt like it was going to go out from under him.     Objective:    Left knee swelling. Drainage into the wound vac sponge   LABS No results for input(s): HGB, HCT, WBC, PLT in the last 72 hours.  No results for input(s): NA, K, BUN, CREATININE, GLUCOSE in the last 72 hours.   Assessment/Plan: 7 Days Post-Op Procedure(s) (LRB): IRRIGATION AND DEBRIDEMENT KNEE (Left)   Up with therapy  HH was set up for the patient, but he has not been able to d/chome yet.  Will discuss with Dr. Alvan Dame. Patient basically states that he can't go through any other washouts after 18 surgeries on the knee already        Danae Orleans PA-C  [facility]  [place] [address], [place], [place] [address] Phone: [phone] [email or web address] Facebook  [employer]

## 2019-11-14 NOTE — TOC Transition Note (Signed)
Transition of Care [facility]) - CM/SW Discharge Note   Patient Details  Name: [name] MRN: [id number] Date of Birth: 04-07-1950  Transition of Care [facility]) CM/SW Contact:  Lennart Pall, LCSW Phone Number: 11/14/2019, 1:51 PM   Clinical Narrative:    Met with pt who reports plan is now for AKA on Friday per Dr. Sharol Given at [facility].  He is in full agreement with this plan and has spoken with me several times about wanting to proceed.  We discussed expected need for rehab following surgery.  TOC at [facility] will follow for this transition.   Final next level of care: Acute to Acute Transfer Barriers to Discharge: Continued Medical Work up   Patient Goals and CMS Choice Patient states their goals for this hospitalization and ongoing recovery are:: return home CMS Medicare.gov Compare Post Acute Care list provided to:: Patient Choice offered to / list presented to : Patient  Discharge Placement                       Discharge Plan and Services In-house Referral: Clinical Social Work   Post Acute Care Choice: Durable Medical Equipment                    HH Arranged: RN, PT, IV Antibiotics HH Agency: [facility], Other - See comment (and [facility]) Date HH Agency Contacted: 11/08/19 Time HH Agency Contacted: 1200 Representative spoke with at [place]: Carolynn Sayers with [facility];  Amy Hyatt with [facility]  Social Determinants of Health (SDOH) Interventions     Readmission Risk Interventions Readmission Risk Prevention Plan 11/09/2019  Transportation Screening Complete  PCP or Specialist Appt within 5-7 Days Complete  Home Care Screening Complete  Medication Review (RN CM) Complete  Some recent data might be hidden

## 2019-11-15 NOTE — Progress Notes (Signed)
Physical Therapy Treatment Patient Details Name: [name] MRN: [id number] DOB: 08/04/1950 Today's Date: 11/15/2019    History of Present Illness Patient is 69 y.o. male s/p Lt TKR with I&D on 11/06/19. Patient has history of left total knee arthroplasty in 7342 complicated by prosthetic joint infection, leading to multiple subsequent surgeries. He has had multiple revision arthroplasties, including resection with antibiotic spacers. He underwent TKR on 04/03/19 with 6 week antiobiotic treatment and then had I&D with polyexchange on 05/17/19 with wound vac placement. On 08/14/19 he had subsequent I&D. Other significant PMH includes A-fib, hypothyroidism, GERD, headaches, CHF.    PT Comments    Assisted OOB to amb in hallway.  Pt in good spirits but "not looking forward to the next few days".  Pt plans to tranfer to [facility] for AKA.   Follow Up Recommendations  Follow surgeon's recommendation for DC plan and follow-up therapies;Home health PT     Equipment Recommendations  None recommended by PT    Recommendations for Other Services       Precautions / Restrictions Precautions Precautions: Fall Precaution Comments: NPWT L knee Restrictions LLE Weight Bearing: Partial weight bearing LLE Partial Weight Bearing Percentage or Pounds: 50 Other Position/Activity Restrictions: WBAT    Mobility  Bed Mobility Overal bed mobility: Needs Assistance Bed Mobility: Supine to Sit;Sit to Supine     Supine to sit: Supervision;HOB elevated Sit to supine: Min assist   General bed mobility comments: self able OOB but assist for L LE up onto bed  Transfers Overall transfer level: Needs assistance Equipment used: Rolling walker (2 wheeled) Transfers: Sit to/from [facility] Sit to Stand: Supervision Stand pivot transfers: Supervision       General transfer comment: Supervision for safet.  prefers to use forward momentum with hands on walker vs push up due to body  habitus  Ambulation/Gait Ambulation/Gait assistance: Supervision Gait Distance (Feet): 225 Feet Assistive device: Rolling walker (2 wheeled) Gait Pattern/deviations: Decreased stride length;Decreased weight shift to left;Wide base of support;Step-through pattern Gait velocity: decreased   General Gait Details: tolerating a functional distance but difficult to achieve true 50% WB due to BMI   Stairs             Wheelchair Mobility    Modified Rankin (Stroke Patients Only)       Balance                                            Cognition Arousal/Alertness: Awake/alert Behavior During Therapy: WFL for tasks assessed/performed Overall Cognitive Status: Within Functional Limits for tasks assessed                                 General Comments: AxO x 3 very pleasant "been through alot" multiple issues with this knee      Exercises      General Comments        Pertinent Vitals/Pain Pain Assessment: Faces Faces Pain Scale: Hurts even more Pain Location: L knee  "feel hot" Pain Descriptors / Indicators: Burning Pain Intervention(s): Monitored during session;Repositioned    Home Living                      Prior Function            PT Goals (current  goals can now be found in the care plan section) Progress towards PT goals: Progressing toward goals    Frequency    7X/week      PT Plan Current plan remains appropriate    Co-evaluation              AM-PAC PT "6 Clicks" Mobility   Outcome Measure  Help needed turning from your back to your side while in a flat bed without using bedrails?: None Help needed moving from lying on your back to sitting on the side of a flat bed without using bedrails?: A Little Help needed moving to and from a bed to a chair (including a wheelchair)?: A Little Help needed standing up from a chair using your arms (e.g., wheelchair or bedside chair)?: A Little Help needed  to walk in hospital room?: A Little Help needed climbing 3-5 steps with a railing? : A Little 6 Click Score: 19    End of Session Equipment Utilized During Treatment: Gait belt Activity Tolerance: Patient tolerated treatment well Patient left: in bed;with call bell/phone within reach Nurse Communication: Mobility status PT Visit Diagnosis: Muscle weakness (generalized) (M62.81);Difficulty in walking, not elsewhere classified (R26.2)     Time: 1610-9604 PT Time Calculation (min) (ACUTE ONLY): 26 min  Charges:  $Gait Training: 8-22 mins $Therapeutic Activity: 8-22 mins                     {Kemesha Mosey  PTA Acute  Rehabilitation Services Pager      [phone] Office      [phone]

## 2019-11-15 NOTE — Care Management Important Message (Signed)
Important Message  Patient Details  IM Letter given to the Patient Name: [name] MRN: [id number] Date of Birth: 09-13-1950   Medicare Important Message Given:  Yes     [name] 11/15/2019, 12:58 PM

## 2019-11-15 NOTE — Progress Notes (Signed)
Patient ID: [name], male   DOB: Jun 27, 1950, 69 y.o.   MRN: [id number]  Very pleasant 69 year old male with history of index left total knee arthroplasty in 2003 by Dr. Dannielle Burn.  This was complicated by infection.  He has been a patient of mine since 2013.  We have had multiple surgical attempts at joint salvage including multiple operating room attempts at I&D and revision surgeries.  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  He has had persistent wound drainage.  His most recent hospitalization was an attempt for repeat I&D to provide further time for social planning for definitive management of his left knee. We have had lengthy discussions in the office over the last year plus regarding definitive management of his left knee.  The discussions of the lead to the decision that ultimate definitive treatment would require above-the-knee amputation based on surgical factors chronic infection despite attempts at surgical management as well as remaining bone stock. During this hospitalization he has come to the conclusion that he is ready to proceed with definitive management.  He is tired of dealing with the persistent pain discomfort and chronic infection of his left knee.  He will work with his family members on definitive social situation but would likely require short-term stay in local nursing facility.  In preparation for this definitive management I have discussed his case with Dr. Meridee Score.  Given Dr. Jess Barters niche expertise in lower extremity amputations associated with infections I have asked for his involvement in definitive management.  I appreciate his willingness to help due to the complexity of his left knee issues including the size of his lower extremity, the chronic infectious state of the knee and the potential need for future repeat surgeries as well as the remaining implants.  Dr. Sharol Given has graciously  accepted the willingness to help [name] get through this process.  He is currently scheduled to be transferred to [facility] for definitive management on Friday with Dr. Sharol Given.  We have reviewed that throughout the hospital stay as well as in the clinic before and as noted Mr. Messman is ready to proceed.  We will place the urgent orders including consent for Dr. Sharol Given as well as make the patient n.p.o. after midnight for surgery tomorrow.

## 2019-11-15 NOTE — Plan of Care (Signed)

## 2019-11-16 MED ORDER — DEXTROSE 5 % IV SOLN
3.0000 g | INTRAVENOUS | Status: AC
  Administered 2019-11-17: 3 g via INTRAVENOUS
  Filled 2019-11-16 (×3): qty 3000

## 2019-11-16 MED ORDER — POVIDONE-IODINE 10 % EX SWAB: 2.0000 "application " | Freq: Once | CUTANEOUS | Status: DC

## 2019-11-16 MED ORDER — CHLORHEXIDINE GLUCONATE 4 % EX LIQD
60.0000 mL | Freq: Once | CUTANEOUS | Status: AC
  Administered 2019-11-17: 4 via TOPICAL
  Filled 2019-11-16: qty 15

## 2019-11-16 MED ORDER — ENSURE PRE-SURGERY PO LIQD
296.0000 mL | Freq: Once | ORAL | Status: AC
  Administered 2019-11-16: 296 mL via ORAL
  Filled 2019-11-16: qty 296

## 2019-11-16 NOTE — Progress Notes (Signed)
Placed patient on CPAP for the night via auto-mode.  

## 2019-11-16 NOTE — H&P (View-Only) (Signed)
  ORTHOPAEDIC CONSULTATION  REQUESTING PHYSICIAN: Olin, Matthew, MD  Chief Complaint: Chronic septic left total knee arthroplasty.  HPI: [name] is a 69 y.o. male who presents with chronic septic left total knee arthroplasty.  Patient has undergone multiple surgical interventions for limb salvage and has persistent infection has failed conservative limb salvage intervention and is seen in consultation for above-knee amputation.  Past Medical History:  Diagnosis Date  . CHF (congestive heart failure) (HCC) 05/2019  . Chronic anticoagulation    with Xarelto  . Complication of anesthesia    PT STATES HARD TO WAKE UP AFTER ONE SUGERY -STATES THE SURGERY TOOK LONGER THAN EXPECTED.  NO PROBLEMS WITH ANY OTHER SURGERY  . Complication of anesthesia    in 12/2018-states started  shaking after surgery, could not get warm   . Dysrhythmia    A-fib  . GERD (gastroesophageal reflux disease)   . History of blood clots   . History of blood transfusion   . Hypothyroidism   . Pain    BACK PAIN - PT ATTRIBUTES TO THE WAY HE WALKS DUE TO LEFT KNEE PROBLEM  . Persistent atrial fibrillation (HCC)   . Septic arthritis of knee (HCC)    LEFT KNEE  . Shortness of breath    WITH EXERTION AND PAIN  . Sleep apnea    uses CPAP,   Past Surgical History:  Procedure Laterality Date  . 2010 REMOVAL OF LEFT TOTAL KNEE    . CARDIAC CATHETERIZATION  2006  . CARDIOVERSION N/A 08/28/2012   Procedure: CARDIOVERSION;  Surgeon: Mihai Croitoru, MD;  Location: [facility] OR;  Service: Cardiovascular;  Laterality: N/A;  . CARDIOVERSION N/A 09/01/2012   Procedure: CARDIOVERSION;  Surgeon: Kenneth C. Hilty, MD;  Location: [facility] OR;  Service: Cardiovascular;  Laterality: N/A;  . COLONOSCOPY N/A 06/13/2013   Procedure: COLONOSCOPY;  Surgeon: Robert D Kaplan, MD;  Location: [facility];  Service: Endoscopy;  Laterality: N/A;  . EXCISIONAL TOTAL KNEE ARTHROPLASTY WITH ANTIBIOTIC SPACERS Left 09/21/2018   Procedure:  Resection of tibia versus both components with placement of antibiotic spacer;  Surgeon: Olin, Matthew, MD;  Location: [facility] ORS;  Service: Orthopedics;  Laterality: Left;  2.5 hrs  . EXCISIONAL TOTAL KNEE ARTHROPLASTY WITH ANTIBIOTIC SPACERS Left 01/02/2019   Procedure: Repeat Washout and placement of antibiotic spacer left knee;  Surgeon: Olin, Matthew, MD;  Location: [facility];  Service: Orthopedics;  Laterality: Left;  2 hrs  . HYDROCELECTOY   2012  . I & D EXTREMITY Left 08/14/2019   Procedure: IRRIGATION AND DEBRIDEMENT LEFT KNEE WOUND;  Surgeon: Olin, Matthew, MD;  Location: [facility];  Service: Orthopedics;  Laterality: Left;  60 mins  . I & D KNEE WITH POLY EXCHANGE Left 05/17/2019   Procedure: IRRIGATION AND DEBRIDEMENT KNEE WITH POLY EXCHANGE;  Surgeon: Olin, Matthew, MD;  Location: [facility];  Service: Orthopedics;  Laterality: Left;  90 mins  . IRRIGATION AND DEBRIDEMENT KNEE Left 04/03/2019   Procedure: REIMPLANTATION OF LEFT KNEE TIBIAL COMPONENT.;  Surgeon: Olin, Matthew, MD;  Location: [facility];  Service: Orthopedics;  Laterality: Left;  need 120 min  . IRRIGATION AND DEBRIDEMENT KNEE Left 11/06/2019   Procedure: IRRIGATION AND DEBRIDEMENT KNEE;  Surgeon: Olin, Matthew, MD;  Location: [facility];  Service: Orthopedics;  Laterality: Left;  90 mins  . LEFT KNEE ARTHROSCOPY  1999  . LEFT KNEE SURGERY UPPER TIBIAL OSTOMY    . LEFT TOTAL KNEE REMOVAL FOR INFECTION  2006  . REIMPLANTATION LEFT TOTAL KNEE    2008  . REIMPLANTATION LEFT TOTAL KNEE   2006  . REIMPLANTATION OF TOTAL KNEE Left 06/23/2012   Procedure: REIMPLANTATION OF LEFT TOTAL KNEE;  Surgeon: Mauri Pole, MD;  Location: [facility];  Service: Orthopedics;  Laterality: Left;  . REMOVAL LEFT TOTAL KNEE   2008  . REPLACEMENT LEFT KNEE  2002  . REPLACEMENT RIGHT KNEE  2003  . REVISION LEFT KNEE CAP   2004  . RIGHT KNEE ARTHROSCOPY  1998  . TEE WITHOUT CARDIOVERSION N/A 08/28/2012   Procedure: TRANSESOPHAGEAL ECHOCARDIOGRAM (TEE);  Surgeon: Sanda Klein, MD;  Location: [place];  Service: Cardiovascular;  Laterality: N/A;  . TONSILLECTOMY    . TOTAL KNEE REVISION Left 12/15/2015   Procedure: TOTAL KNEE REVISION REPLACEMENT;  Surgeon: Paralee Cancel, MD;  Location: [facility];  Service: Orthopedics;  Laterality: Left;  Adductor Block   Social History   Socioeconomic History  . Marital status: Single    Spouse name: Not on file  . Number of children: Not on file  . Years of education: Not on file  . Highest education level: Not on file  Occupational History  . Not on file  Tobacco Use  . Smoking status: Never Smoker  . Smokeless tobacco: Never Used  Vaping Use  . Vaping Use: Never used  Substance and Sexual Activity  . Alcohol use: Not Currently    Comment: not since 1999  . Drug use: No  . Sexual activity: Yes    Partners: Female  Other Topics Concern  . Not on file  Social History Narrative  . Not on file   Social Determinants of Health   Financial Resource Strain:   . Difficulty of Paying Living Expenses: Not on file  Food Insecurity:   . Worried About Charity fundraiser in the Last Year: Not on file  . Ran Out of Food in the Last Year: Not on file  Transportation Needs:   . Lack of Transportation (Medical): Not on file  . Lack of Transportation (Non-Medical): Not on file  Physical Activity:   . Days of Exercise per Week: Not on file  . Minutes of Exercise per Session: Not on file  Stress:   . Feeling of Stress : Not on file  Social Connections:   . Frequency of Communication with Friends and Family: Not on file  . Frequency of Social Gatherings with Friends and Family: Not on file  . Attends Religious Services: Not on file  . Active Member of Clubs or Organizations: Not on file  . Attends Archivist Meetings: Not on file  . Marital Status: Not on file   Family History  Problem Relation Age of Onset  . Heart disease Father 17  . Pancreatic cancer Sister   . Liver cancer Sister   . Cervical cancer  Sister   . Breast cancer Sister   . Diabetes Sister   . Colon cancer Neg Hx   . Throat cancer Neg Hx   . Stomach cancer Neg Hx   . Kidney disease Neg Hx   . Liver disease Neg Hx    - negative except otherwise stated in the family history section Allergies  Allergen Reactions  . Morphine Hives   Prior to Admission medications   Medication Sig Start Date End Date Taking? Authorizing Provider  diltiazem (CARDIZEM CD) 240 MG 24 hr capsule TAKE 1 CAPSULE BY MOUTH DAILY Patient taking differently: Take 240 mg by mouth daily.  10/10/18  Yes Croitoru, Mihai,  MD  doxycycline (VIBRAMYCIN) 100 MG capsule Take 1 capsule (100 mg total) by mouth 2 (two) times daily. 08/16/19  Yes Babish, Rodman Key, PA-C  ferrous sulfate (FERROUSUL) 325 (65 FE) MG tablet Take 1 tablet (325 mg total) by mouth 3 (three) times daily with meals for 14 days. Patient taking differently: Take 325 mg by mouth daily.  08/16/19 10/30/20 Yes Babish, Rodman Key, PA-C  levothyroxine (SYNTHROID) 125 MCG tablet Take 125 mcg by mouth daily before breakfast.  10/31/15  Yes [provider]  methocarbamol (ROBAXIN) 500 MG tablet Take 1 tablet (500 mg total) by mouth every 6 (six) hours as needed for muscle spasms. 08/16/19  Yes Babish, Rodman Key, PA-C  metoprolol tartrate (LOPRESSOR) 50 MG tablet TAKE 1 TABLET BY MOUTH TWICE DAILY Patient taking differently: Take 50 mg by mouth 2 (two) times daily.  12/12/18  Yes Croitoru, Mihai, MD  Multiple Vitamin (MULTIVITAMIN WITH MINERALS) TABS Take 1 tablet by mouth daily.   Yes [provider]  XARELTO 20 MG TABS tablet TAKE 1 TABLET BY MOUTH DAILY WITH BREAKFAST Patient taking differently: No sig reported 10/08/19  Yes Croitoru, Mihai, MD  acetaminophen (TYLENOL) 500 MG tablet Take 2 tablets (1,000 mg total) by mouth every 8 (eight) hours. Patient not taking: Reported on 10/31/2019 08/16/19   Danae Orleans, PA-C  ampicillin IVPB Inject 12 g into the vein daily. As a continuous infusion.  Indication:  Prosthetic joint infection  First Dose: Yes Last Day of Therapy:  12/20/19 Labs - Once weekly:  CBC/D and BMP, Labs - Every other week:  ESR and CRP Method of administration: Ambulatory Pump (Continuous Infusion) Method of administration may be changed at the discretion of home infusion pharmacist based upon assessment of the patient and/or caregiver's ability to self-administer the medication ordered. 11/09/19 12/21/19  Danae Orleans, PA-C  furosemide (LASIX) 20 MG tablet Take 20 mg by mouth daily as needed (fluid retention.).  08/29/19   [provider]  oxyCODONE (OXY IR/ROXICODONE) 5 MG immediate release tablet Take 1-2 tablets (5-10 mg total) by mouth every 4 (four) hours as needed for moderate pain or severe pain. Patient not taking: Reported on 10/31/2019 08/16/19   Danae Orleans, PA-C   No results found. - pertinent xrays, CT, MRI studies were reviewed and independently interpreted  Positive ROS: All other systems have been reviewed and were otherwise negative with the exception of those mentioned in the HPI and as above.  Physical Exam: General: Alert, no acute distress Psychiatric: Patient is competent for consent with normal mood and affect Lymphatic: No axillary or cervical lymphadenopathy Cardiovascular: No pedal edema Respiratory: No cyanosis, no use of accessory musculature GI: No organomegaly, abdomen is soft and non-tender    Images:  _0 @  Labs:  Lab Results  Component Value Date   HGBA1C 5.5 11/02/2019   HGBA1C 5.3 08/26/2012   HGBA1C  05/16/2008    5.6 (NOTE) The ADA recommends the following therapeutic goal for glycemic control related to Hgb A1c measurement: Goal of therapy: <6.5 Hgb A1c  Reference: American Diabetes Association: Clinical Practice Recommendations 2010, Diabetes Care, 2010, 33: (Suppl  1).   ESRSEDRATE 64 (H) 02/12/2019   ESRSEDRATE 53 (H) 02/05/2019   ESRSEDRATE 75 (H) 01/29/2019   CRP 4.1 (H) 02/12/2019    CRP 3.9 (H) 02/05/2019   CRP 3.6 (H) 01/29/2019   REPTSTATUS 11/09/2019 FINAL 11/06/2019   REPTSTATUS 11/12/2019 FINAL 11/06/2019   GRAMSTAIN  11/06/2019    RARE WBC PRESENT,BOTH PMN AND MONONUCLEAR RARE GRAM POSITIVE  COCCI IN PAIRS Performed at [facility], [address] [place], [place] [address]    GRAMSTAIN  11/06/2019    ABUNDANT WBC PRESENT,BOTH PMN AND MONONUCLEAR FEW GRAM POSITIVE COCCI    CULT FEW ENTEROCOCCUS FAECALIS 11/06/2019   CULT  11/06/2019    NO ANAEROBES ISOLATED Performed at  Hospital Lab, [address] [place],  [address]    LABORGA ENTEROCOCCUS FAECALIS 11/06/2019    Lab Results  Component Value Date   ALBUMIN 3.4 (L) 05/14/2019   ALBUMIN 3.8 01/25/2013   ALBUMIN 3.4 (L) 08/30/2012    Neurologic: Patient does not have protective sensation bilateral lower extremities.   MUSCULOSKELETAL:   Skin: Examination of the wound VAC is intact with good suction.  Patient has brawny edema from venous insufficiency.  He has a good dorsalis pedis pulse.  Review of the radiographs shows a longstem revision total knee arthroplasty.  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  Plan: Plan: We will plan for a left above-knee amputation tomorrow.  Discussed with the patient the complex nature of the amputation with need to incise the bone to remove the stemmed femoral implant and need for suture cables to reapproximate the femur.  Risks and benefits were discussed including persistent infection need for additional surgery.  Patient states he understands wished to proceed at this time we will plan for surgery tomorrow morning Saturday at 930.  Thank you for the consult and the opportunity to see [name]  Brendia Dampier, MD [facility] [phone] 5:06 PM     

## 2019-11-16 NOTE — Plan of Care (Signed)

## 2019-11-16 NOTE — TOC Progression Note (Signed)
Transition of Care [facility]) - Progression Note    Patient Details  Name: [name] MRN: [id number] Date of Birth: 1950/11/07  [facility] of [facility]) CM/SW Contact  Sharin Mons, RN Phone Number: 11/16/2019, 12:12 PM  Clinical Narrative:    Transferred from [facility] S/p 5 Days Post-Op Procedure, I and D L knee  Plan: surgery L AKA, 10/8 with Dr. Sharol Given  [facility] team following for [facility] needs .Marland Kitchen...  Expected Discharge Plan: [place] Barriers to Discharge: Continued Medical Work up  Expected Discharge Plan and Services Expected Discharge Plan: [place] In-house Referral: Clinical Social Work   Post Acute Care Choice: Durable Medical Equipment Living arrangements for the past 2 months: [place]: RN, PT, IV Antibiotics [place], Other - See comment (and [employer]) Date HH Agency Contacted: 11/08/19 Time HH Agency Contacted: 1200 Representative spoke with at [place]: Carolynn Sayers with [employer];  Amy Hyatt with [facility]   Social Determinants of Health (SDOH) Interventions    Readmission Risk Interventions Readmission Risk Prevention Plan 11/09/2019  Transportation Screening Complete  PCP or Specialist Appt within 5-7 Days Complete  Home Care Screening Complete  Medication Review (RN CM) Complete  Some recent data might be hidden

## 2019-11-16 NOTE — Progress Notes (Signed)
Wound vac cannister changed- output 200

## 2019-11-16 NOTE — Progress Notes (Signed)
PT Cancellation Note  Patient Details Name: [name] MRN: [id number] DOB: [birth year]   Cancelled Treatment:    Reason Eval/Treat Not Completed: Patient at procedure or test/unavailable (planned AKA today, will reassess post op).  Wyona Almas, PT, DPT Acute Rehabilitation Services Pager [phone] Office [phone]    Deno Etienne 11/16/2019, 2:11 PM

## 2019-11-16 NOTE — Progress Notes (Signed)
[id number] Pt was made NPO since yesterday 6pm at [facility] long.

## 2019-11-16 NOTE — Progress Notes (Signed)
Patient has been NPO since MN- patient and staff was under the impression that he would be going to surgery this evening.  The OR was contacted and the nurse was made aware that the patient is not on the surgery schedule for today.  Dr Sharol Given was contacted and made aware of the patient being admitted on [address]. Dr Sharol Given will see the patient today and plan of care is for the patient to have surgery tomorrow if possible.  The patient was informed and change in plans and will be ordering a tray from dietary.

## 2019-11-16 NOTE — Consult Note (Signed)
ORTHOPAEDIC CONSULTATION  REQUESTING PHYSICIAN: Paralee Cancel, MD  Chief Complaint: Chronic septic left total knee arthroplasty.  HPI: [name] is a 69 y.o. male who presents with chronic septic left total knee arthroplasty.  Patient has undergone multiple surgical interventions for limb salvage and has persistent infection has failed conservative limb salvage intervention and is seen in consultation for above-knee amputation.  Past Medical History:  Diagnosis Date  . CHF (congestive heart failure) ([place]) 05/2019  . Chronic anticoagulation    with Xarelto  . Complication of anesthesia    PT STATES HARD TO WAKE UP AFTER ONE SUGERY -STATES THE SURGERY TOOK LONGER THAN EXPECTED.  NO PROBLEMS WITH ANY OTHER SURGERY  . Complication of anesthesia    in 12/2018-states started  shaking after surgery, could not get warm   . Dysrhythmia    A-fib  . GERD (gastroesophageal reflux disease)   . History of blood clots   . History of blood transfusion   . Hypothyroidism   . Pain    BACK PAIN - PT ATTRIBUTES TO THE WAY HE WALKS DUE TO LEFT KNEE PROBLEM  . Persistent atrial fibrillation ([place])   . Septic arthritis of knee ([facility])    LEFT KNEE  . Shortness of breath    WITH EXERTION AND PAIN  . Sleep apnea    uses CPAP,   Past Surgical History:  Procedure Laterality Date  . 2010 REMOVAL OF LEFT TOTAL KNEE    . CARDIAC CATHETERIZATION  2006  . CARDIOVERSION N/A 08/28/2012   Procedure: CARDIOVERSION;  Surgeon: Sanda Klein, MD;  Location: [place];  Service: Cardiovascular;  Laterality: N/A;  . CARDIOVERSION N/A 09/01/2012   Procedure: CARDIOVERSION;  Surgeon: Pixie Casino, MD;  Location: [place];  Service: Cardiovascular;  Laterality: N/A;  . COLONOSCOPY N/A 06/13/2013   Procedure: COLONOSCOPY;  Surgeon: Inda Castle, MD;  [facility];  Service: Endoscopy;  Laterality: N/A;  . EXCISIONAL TOTAL KNEE ARTHROPLASTY WITH ANTIBIOTIC SPACERS Left 09/21/2018   Procedure:  Resection of tibia versus both components with placement of antibiotic spacer;  Surgeon: Paralee Cancel, MD;  [facility];  Service: Orthopedics;  Laterality: Left;  2.5 hrs  . EXCISIONAL TOTAL KNEE ARTHROPLASTY WITH ANTIBIOTIC SPACERS Left 01/02/2019   Procedure: Repeat Washout and placement of antibiotic spacer left knee;  Surgeon: Paralee Cancel, MD;  [facility];  Service: Orthopedics;  Laterality: Left;  2 hrs  . HYDROCELECTOY   2012  . I & D EXTREMITY Left 08/14/2019   Procedure: IRRIGATION AND DEBRIDEMENT LEFT KNEE WOUND;  Surgeon: Paralee Cancel, MD;  [facility];  Service: Orthopedics;  Laterality: Left;  60 mins  . I & D KNEE WITH POLY EXCHANGE Left 05/17/2019   Procedure: IRRIGATION AND DEBRIDEMENT KNEE WITH POLY EXCHANGE;  Surgeon: Paralee Cancel, MD;  [facility];  Service: Orthopedics;  Laterality: Left;  90 mins  . IRRIGATION AND DEBRIDEMENT KNEE Left 04/03/2019   Procedure: REIMPLANTATION OF LEFT KNEE TIBIAL COMPONENT.;  Surgeon: Paralee Cancel, MD;  [facility];  Service: Orthopedics;  Laterality: Left;  need 120 min  . IRRIGATION AND DEBRIDEMENT KNEE Left 11/06/2019   Procedure: IRRIGATION AND DEBRIDEMENT KNEE;  Surgeon: Paralee Cancel, MD;  [facility];  Service: Orthopedics;  Laterality: Left;  90 mins  . LEFT KNEE ARTHROSCOPY  1999  . LEFT KNEE SURGERY UPPER TIBIAL OSTOMY    . LEFT TOTAL KNEE REMOVAL FOR INFECTION  2006  . REIMPLANTATION LEFT TOTAL KNEE  2008  . REIMPLANTATION LEFT TOTAL KNEE   2006  . REIMPLANTATION OF TOTAL KNEE Left 06/23/2012   Procedure: REIMPLANTATION OF LEFT TOTAL KNEE;  Surgeon: Mauri Pole, MD;  [facility];  Service: Orthopedics;  Laterality: Left;  . REMOVAL LEFT TOTAL KNEE   2008  . REPLACEMENT LEFT KNEE  2002  . REPLACEMENT RIGHT KNEE  2003  . REVISION LEFT KNEE CAP   2004  . RIGHT KNEE ARTHROSCOPY  1998  . TEE WITHOUT CARDIOVERSION N/A 08/28/2012   Procedure: TRANSESOPHAGEAL ECHOCARDIOGRAM (TEE);  Surgeon: Sanda Klein, MD;  Location: [place];  Service: Cardiovascular;  Laterality: N/A;  . TONSILLECTOMY    . TOTAL KNEE REVISION Left 12/15/2015   Procedure: TOTAL KNEE REVISION REPLACEMENT;  Surgeon: Paralee Cancel, MD;  [facility];  Service: Orthopedics;  Laterality: Left;  Adductor Block   Social History   Socioeconomic History  . Marital status: Single    Spouse name: Not on file  . Number of children: Not on file  . Years of education: Not on file  . Highest education level: Not on file  Occupational History  . Not on file  Tobacco Use  . Smoking status: Never Smoker  . Smokeless tobacco: Never Used  Vaping Use  . Vaping Use: Never used  Substance and Sexual Activity  . Alcohol use: Not Currently    Comment: not since 1999  . Drug use: No  . Sexual activity: Yes    Partners: Female  Other Topics Concern  . Not on file  Social History Narrative  . Not on file   Social Determinants of Health   Financial Resource Strain:   . Difficulty of Paying Living Expenses: Not on file  Food Insecurity:   . Worried About Charity fundraiser in the Last Year: Not on file  . Ran Out of Food in the Last Year: Not on file  Transportation Needs:   . Lack of Transportation (Medical): Not on file  . Lack of Transportation (Non-Medical): Not on file  Physical Activity:   . Days of Exercise per Week: Not on file  . Minutes of Exercise per Session: Not on file  Stress:   . Feeling of Stress : Not on file  Social Connections:   . Frequency of Communication with Friends and Family: Not on file  . Frequency of Social Gatherings with Friends and Family: Not on file  . Attends Religious Services: Not on file  . Active Member of Clubs or Organizations: Not on file  . Attends Archivist Meetings: Not on file  . Marital Status: Not on file   Family History  Problem Relation Age of Onset  . Heart disease Father 3  . Pancreatic cancer Sister   . Liver cancer Sister   . Cervical cancer  Sister   . Breast cancer Sister   . Diabetes Sister   . Colon cancer Neg Hx   . Throat cancer Neg Hx   . Stomach cancer Neg Hx   . Kidney disease Neg Hx   . Liver disease Neg Hx    - negative except otherwise stated in the family history section Allergies  Allergen Reactions  . Morphine Hives   Prior to Admission medications   Medication Sig Start Date End Date Taking? Authorizing Provider  diltiazem (CARDIZEM CD) 240 MG 24 hr capsule TAKE 1 CAPSULE BY MOUTH DAILY Patient taking differently: Take 240 mg by mouth daily.  10/10/18  Yes Croitoru, Mihai,  MD  doxycycline (VIBRAMYCIN) 100 MG capsule Take 1 capsule (100 mg total) by mouth 2 (two) times daily. 08/16/19  Yes Babish, Rodman Key, PA-C  ferrous sulfate (FERROUSUL) 325 (65 FE) MG tablet Take 1 tablet (325 mg total) by mouth 3 (three) times daily with meals for 14 days. Patient taking differently: Take 325 mg by mouth daily.  08/16/19 10/30/20 Yes Babish, Rodman Key, PA-C  levothyroxine (SYNTHROID) 125 MCG tablet Take 125 mcg by mouth daily before breakfast.  10/31/15  Yes [provider]  methocarbamol (ROBAXIN) 500 MG tablet Take 1 tablet (500 mg total) by mouth every 6 (six) hours as needed for muscle spasms. 08/16/19  Yes Babish, Rodman Key, PA-C  metoprolol tartrate (LOPRESSOR) 50 MG tablet TAKE 1 TABLET BY MOUTH TWICE DAILY Patient taking differently: Take 50 mg by mouth 2 (two) times daily.  12/12/18  Yes Croitoru, Mihai, MD  Multiple Vitamin (MULTIVITAMIN WITH MINERALS) TABS Take 1 tablet by mouth daily.   Yes [provider]  XARELTO 20 MG TABS tablet TAKE 1 TABLET BY MOUTH DAILY WITH BREAKFAST Patient taking differently: No sig reported 10/08/19  Yes Croitoru, Mihai, MD  acetaminophen (TYLENOL) 500 MG tablet Take 2 tablets (1,000 mg total) by mouth every 8 (eight) hours. Patient not taking: Reported on 10/31/2019 08/16/19   Danae Orleans, PA-C  ampicillin IVPB Inject 12 g into the vein daily. As a continuous infusion.  Indication:  Prosthetic joint infection  First Dose: Yes Last Day of Therapy:  12/20/19 Labs - Once weekly:  CBC/D and BMP, Labs - Every other week:  ESR and CRP Method of administration: Ambulatory Pump (Continuous Infusion) Method of administration may be changed at the discretion of home infusion pharmacist based upon assessment of the patient and/or caregiver's ability to self-administer the medication ordered. 11/09/19 12/21/19  Danae Orleans, PA-C  furosemide (LASIX) 20 MG tablet Take 20 mg by mouth daily as needed (fluid retention.).  08/29/19   [provider]  oxyCODONE (OXY IR/ROXICODONE) 5 MG immediate release tablet Take 1-2 tablets (5-10 mg total) by mouth every 4 (four) hours as needed for moderate pain or severe pain. Patient not taking: Reported on 10/31/2019 08/16/19   Danae Orleans, PA-C   No results found. - pertinent xrays, CT, MRI studies were reviewed and independently interpreted  Positive ROS: All other systems have been reviewed and were otherwise negative with the exception of those mentioned in the HPI and as above.  Physical Exam: General: Alert, no acute distress Psychiatric: Patient is competent for consent with normal mood and affect Lymphatic: No axillary or cervical lymphadenopathy Cardiovascular: No pedal edema Respiratory: No cyanosis, no use of accessory musculature GI: No organomegaly, abdomen is soft and non-tender    Images:  _0 @  Labs:  Lab Results  Component Value Date   HGBA1C 5.5 11/02/2019   HGBA1C 5.3 08/26/2012   HGBA1C  05/16/2008    5.6 (NOTE) The ADA recommends the following therapeutic goal for glycemic control related to Hgb A1c measurement: Goal of therapy: <6.5 Hgb A1c  Reference: American Diabetes Association: Clinical Practice Recommendations 2010, Diabetes Care, 2010, 33: (Suppl  1).   ESRSEDRATE 64 (H) 02/12/2019   ESRSEDRATE 53 (H) 02/05/2019   ESRSEDRATE 75 (H) 01/29/2019   CRP 4.1 (H) 02/12/2019    CRP 3.9 (H) 02/05/2019   CRP 3.6 (H) 01/29/2019   REPTSTATUS 11/09/2019 FINAL 11/06/2019   REPTSTATUS 11/12/2019 FINAL 11/06/2019   GRAMSTAIN  11/06/2019    RARE WBC PRESENT,BOTH PMN AND MONONUCLEAR RARE GRAM POSITIVE  COCCI IN PAIRS Performed at [facility], [place] [address] [place], [place] [address]    GRAMSTAIN  11/06/2019    ABUNDANT WBC PRESENT,BOTH PMN AND MONONUCLEAR FEW GRAM POSITIVE COCCI    CULT FEW ENTEROCOCCUS FAECALIS 11/06/2019   CULT  11/06/2019    NO ANAEROBES ISOLATED Performed at [facility], [place] [address] [place], [place] [address]    LABORGA ENTEROCOCCUS FAECALIS 11/06/2019    Lab Results  Component Value Date   ALBUMIN 3.4 (L) 05/14/2019   ALBUMIN 3.8 01/25/2013   ALBUMIN 3.4 (L) 08/30/2012    Neurologic: Patient does not have protective sensation bilateral lower extremities.   MUSCULOSKELETAL:   Skin: Examination of the wound VAC is intact with good suction.  Patient has brawny edema from venous insufficiency.  He has a good dorsalis pedis pulse.  Review of the radiographs shows a longstem revision total knee arthroplasty.  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  Plan: Plan: We will plan for a left above-knee amputation tomorrow.  Discussed with the patient the complex nature of the amputation with need to incise the bone to remove the stemmed femoral implant and need for suture cables to reapproximate the femur.  Risks and benefits were discussed including persistent infection need for additional surgery.  Patient states he understands wished to proceed at this time we will plan for surgery tomorrow morning Saturday at 930.  Thank you for the consult and the opportunity to see [name], [place] [phone] 5:06 PM

## 2019-11-16 NOTE — Progress Notes (Signed)
Pt left via carelink. Report called to Myra at [facility].

## 2019-11-17 ENCOUNTER — Encounter (HOSPITAL_COMMUNITY)
Admission: RE | Disposition: A | Discharge status: Skilled Nursing Facility | Payer: Self-pay | Source: Home / Self Care | Attending: Orthopedic Surgery

## 2019-11-17 ENCOUNTER — Inpatient Hospital Stay (HOSPITAL_COMMUNITY): Payer: Medicare Other | Admitting: Anesthesiology

## 2019-11-17 ENCOUNTER — Encounter (HOSPITAL_COMMUNITY): Payer: Self-pay | Admitting: Orthopedic Surgery

## 2019-11-17 DIAGNOSIS — M009 Pyogenic arthritis, unspecified: Secondary | ICD-10-CM | POA: Diagnosis not present

## 2019-11-17 DIAGNOSIS — T8454XA Infection and inflammatory reaction due to internal left knee prosthesis, initial encounter: Principal | ICD-10-CM

## 2019-11-17 HISTORY — PX: AMPUTATION: SHX166

## 2019-11-17 LAB — POCT I-STAT, CHEM 8
BUN: 14 mg/dL (ref 8–23)
Calcium, Ion: 1.32 mmol/L (ref 1.15–1.40)
Chloride: 106 mmol/L (ref 98–111)
Creatinine, Ser: 0.9 mg/dL (ref 0.61–1.24)
Glucose, Bld: 118 mg/dL — ABNORMAL HIGH (ref 70–99)
HCT: 25 % — ABNORMAL LOW (ref 39.0–52.0)
Hemoglobin: 8.5 g/dL — ABNORMAL LOW (ref 13.0–17.0)
Potassium: 4.3 mmol/L (ref 3.5–5.1)
Sodium: 138 mmol/L (ref 135–145)
TCO2: 25 mmol/L (ref 22–32)

## 2019-11-17 LAB — PREPARE RBC (CROSSMATCH)

## 2019-11-17 SURGERY — AMPUTATION, ABOVE KNEE
Anesthesia: General | Site: Knee | Laterality: Left

## 2019-11-17 MED ORDER — METOCLOPRAMIDE HCL 5 MG PO TABS: 5.0000 mg | ORAL_TABLET | Freq: Three times a day (TID) | ORAL | Status: DC | PRN

## 2019-11-17 MED ORDER — CEFAZOLIN SODIUM-DEXTROSE 2-4 GM/100ML-% IV SOLN: 2.0000 g | Freq: Four times a day (QID) | INTRAVENOUS | Status: DC

## 2019-11-17 MED ORDER — PHENYLEPHRINE HCL-NACL 10-0.9 MG/250ML-% IV SOLN
INTRAVENOUS | Status: AC
  Filled 2019-11-17: qty 250

## 2019-11-17 MED ORDER — ACETAMINOPHEN 325 MG PO TABS
325.0000 mg | ORAL_TABLET | Freq: Four times a day (QID) | ORAL | Status: DC | PRN
  Administered 2019-11-21: 650 mg via ORAL
  Filled 2019-11-17: qty 2

## 2019-11-17 MED ORDER — PROPOFOL 10 MG/ML IV BOLUS
INTRAVENOUS | Status: DC | PRN
  Administered 2019-11-17: 180 mg via INTRAVENOUS

## 2019-11-17 MED ORDER — METHOCARBAMOL 500 MG PO TABS
500.0000 mg | ORAL_TABLET | Freq: Four times a day (QID) | ORAL | Status: DC | PRN
  Administered 2019-11-17 – 2019-11-22 (×9): 500 mg via ORAL
  Filled 2019-11-17 (×10): qty 1

## 2019-11-17 MED ORDER — SODIUM CHLORIDE 0.9 % IV SOLN: INTRAVENOUS | Status: DC

## 2019-11-17 MED ORDER — METHOCARBAMOL 1000 MG/10ML IJ SOLN
500.0000 mg | Freq: Four times a day (QID) | INTRAVENOUS | Status: DC | PRN
  Filled 2019-11-17: qty 5

## 2019-11-17 MED ORDER — HYDROMORPHONE HCL 1 MG/ML IJ SOLN
INTRAMUSCULAR | Status: AC
Start: 2019-11-17 — End: 2019-11-18
  Administered 2019-11-18: 0.5 mg via INTRAVENOUS
  Filled 2019-11-17: qty 1

## 2019-11-17 MED ORDER — METOCLOPRAMIDE HCL 5 MG/ML IJ SOLN: 5.0000 mg | Freq: Three times a day (TID) | INTRAMUSCULAR | Status: DC | PRN

## 2019-11-17 MED ORDER — ONDANSETRON HCL 4 MG/2ML IJ SOLN: 4.0000 mg | Freq: Four times a day (QID) | INTRAMUSCULAR | Status: DC | PRN

## 2019-11-17 MED ORDER — LIDOCAINE 2% (20 MG/ML) 5 ML SYRINGE
INTRAMUSCULAR | Status: DC | PRN
  Administered 2019-11-17: 60 mg via INTRAVENOUS

## 2019-11-17 MED ORDER — PROPOFOL 10 MG/ML IV BOLUS
INTRAVENOUS | Status: AC
  Filled 2019-11-17: qty 20

## 2019-11-17 MED ORDER — SODIUM CHLORIDE 0.9% IV SOLUTION: Freq: Once | INTRAVENOUS | Status: DC

## 2019-11-17 MED ORDER — KETAMINE HCL 50 MG/5ML IJ SOSY
PREFILLED_SYRINGE | INTRAMUSCULAR | Status: AC
  Filled 2019-11-17: qty 5

## 2019-11-17 MED ORDER — FENTANYL CITRATE (PF) 100 MCG/2ML IJ SOLN
INTRAMUSCULAR | Status: AC
  Filled 2019-11-17: qty 2

## 2019-11-17 MED ORDER — OXYCODONE HCL 5 MG PO TABS
5.0000 mg | ORAL_TABLET | ORAL | Status: DC | PRN
  Administered 2019-11-19 – 2019-11-20 (×2): 5 mg via ORAL
  Administered 2019-11-21 (×3): 10 mg via ORAL
  Administered 2019-11-21: 5 mg via ORAL
  Administered 2019-11-21: 10 mg via ORAL
  Administered 2019-11-22 (×2): 5 mg via ORAL
  Filled 2019-11-17: qty 2
  Filled 2019-11-17 (×2): qty 1
  Filled 2019-11-17 (×2): qty 2
  Filled 2019-11-17: qty 1
  Filled 2019-11-17: qty 2
  Filled 2019-11-17: qty 1

## 2019-11-17 MED ORDER — LIDOCAINE 2% (20 MG/ML) 5 ML SYRINGE
INTRAMUSCULAR | Status: AC
  Filled 2019-11-17: qty 5

## 2019-11-17 MED ORDER — CHLORHEXIDINE GLUCONATE 0.12 % MT SOLN
15.0000 mL | Freq: Once | OROMUCOSAL | Status: AC
  Administered 2019-11-17: 15 mL via OROMUCOSAL

## 2019-11-17 MED ORDER — KETAMINE HCL 10 MG/ML IJ SOLN
INTRAMUSCULAR | Status: DC | PRN
  Administered 2019-11-17: 50 mg via INTRAVENOUS

## 2019-11-17 MED ORDER — MIDAZOLAM HCL 5 MG/5ML IJ SOLN
INTRAMUSCULAR | Status: DC | PRN
  Administered 2019-11-17: 2 mg via INTRAVENOUS

## 2019-11-17 MED ORDER — DOCUSATE SODIUM 100 MG PO CAPS
100.0000 mg | ORAL_CAPSULE | Freq: Two times a day (BID) | ORAL | Status: DC
  Administered 2019-11-17 – 2019-11-21 (×9): 100 mg via ORAL
  Filled 2019-11-17 (×10): qty 1

## 2019-11-17 MED ORDER — ONDANSETRON HCL 4 MG/2ML IJ SOLN: 4.0000 mg | Freq: Once | INTRAMUSCULAR | Status: DC | PRN

## 2019-11-17 MED ORDER — HYDROMORPHONE HCL 1 MG/ML IJ SOLN
0.2500 mg | INTRAMUSCULAR | Status: DC | PRN
  Administered 2019-11-17 (×2): 0.5 mg via INTRAVENOUS

## 2019-11-17 MED ORDER — FENTANYL CITRATE (PF) 100 MCG/2ML IJ SOLN
25.0000 ug | INTRAMUSCULAR | Status: DC | PRN
  Administered 2019-11-17 (×2): 50 ug via INTRAVENOUS

## 2019-11-17 MED ORDER — CHLORHEXIDINE GLUCONATE 0.12 % MT SOLN
OROMUCOSAL | Status: AC
  Filled 2019-11-17: qty 15

## 2019-11-17 MED ORDER — OXYCODONE HCL 5 MG PO TABS
5.0000 mg | ORAL_TABLET | Freq: Once | ORAL | Status: AC | PRN
  Administered 2019-11-17: 5 mg via ORAL

## 2019-11-17 MED ORDER — ONDANSETRON HCL 4 MG PO TABS: 4.0000 mg | ORAL_TABLET | Freq: Four times a day (QID) | ORAL | Status: DC | PRN

## 2019-11-17 MED ORDER — MIDAZOLAM HCL 2 MG/2ML IJ SOLN
INTRAMUSCULAR | Status: AC
  Filled 2019-11-17: qty 2

## 2019-11-17 MED ORDER — PHENYLEPHRINE HCL-NACL 10-0.9 MG/250ML-% IV SOLN
INTRAVENOUS | Status: DC | PRN
  Administered 2019-11-17: 100 ug/min via INTRAVENOUS

## 2019-11-17 MED ORDER — LACTATED RINGERS IV SOLN: INTRAVENOUS | Status: DC

## 2019-11-17 MED ORDER — OXYCODONE HCL 5 MG PO TABS
10.0000 mg | ORAL_TABLET | ORAL | Status: DC | PRN
  Administered 2019-11-17 – 2019-11-18 (×5): 15 mg via ORAL
  Administered 2019-11-18: 10 mg via ORAL
  Administered 2019-11-18 – 2019-11-19 (×4): 15 mg via ORAL
  Administered 2019-11-20: 10 mg via ORAL
  Filled 2019-11-17 (×3): qty 3
  Filled 2019-11-17: qty 2
  Filled 2019-11-17 (×3): qty 3
  Filled 2019-11-17 (×2): qty 2
  Filled 2019-11-17 (×6): qty 3

## 2019-11-17 MED ORDER — HYDROMORPHONE HCL 1 MG/ML IJ SOLN
0.5000 mg | INTRAMUSCULAR | Status: DC | PRN
  Administered 2019-11-17 (×2): 1 mg via INTRAVENOUS
  Administered 2019-11-18: 0.5 mg via INTRAVENOUS
  Filled 2019-11-17: qty 0.5
  Filled 2019-11-17 (×2): qty 1
  Filled 2019-11-17: qty 0.5

## 2019-11-17 MED ORDER — HYDROMORPHONE HCL 1 MG/ML IJ SOLN
0.5000 mg | INTRAMUSCULAR | Status: DC | PRN
  Administered 2019-11-17: 0.5 mg via INTRAVENOUS

## 2019-11-17 MED ORDER — PHENYLEPHRINE 40 MCG/ML (10ML) SYRINGE FOR IV PUSH (FOR BLOOD PRESSURE SUPPORT)
PREFILLED_SYRINGE | INTRAVENOUS | Status: AC
  Filled 2019-11-17: qty 10

## 2019-11-17 MED ORDER — MAGNESIUM CITRATE PO SOLN: 1.0000 | Freq: Once | ORAL | Status: DC | PRN

## 2019-11-17 MED ORDER — FENTANYL CITRATE (PF) 250 MCG/5ML IJ SOLN
INTRAMUSCULAR | Status: AC
  Filled 2019-11-17: qty 5

## 2019-11-17 MED ORDER — VASOPRESSIN 20 UNIT/ML IV SOLN
INTRAVENOUS | Status: AC
  Filled 2019-11-17: qty 1

## 2019-11-17 MED ORDER — VASOPRESSIN 20 UNIT/ML IV SOLN
INTRAVENOUS | Status: DC | PRN
  Administered 2019-11-17: 1 [IU] via INTRAVENOUS
  Administered 2019-11-17: 2 [IU] via INTRAVENOUS
  Administered 2019-11-17: 1 [IU] via INTRAVENOUS

## 2019-11-17 MED ORDER — PHENYLEPHRINE 40 MCG/ML (10ML) SYRINGE FOR IV PUSH (FOR BLOOD PRESSURE SUPPORT)
PREFILLED_SYRINGE | INTRAVENOUS | Status: DC | PRN
  Administered 2019-11-17 (×2): 80 ug via INTRAVENOUS
  Administered 2019-11-17: 120 ug via INTRAVENOUS
  Administered 2019-11-17 (×2): 80 ug via INTRAVENOUS

## 2019-11-17 MED ORDER — OXYCODONE HCL 5 MG/5ML PO SOLN: 5.0000 mg | Freq: Once | ORAL | Status: AC | PRN

## 2019-11-17 MED ORDER — FENTANYL CITRATE (PF) 100 MCG/2ML IJ SOLN
INTRAMUSCULAR | Status: DC | PRN
  Administered 2019-11-17: 25 ug via INTRAVENOUS
  Administered 2019-11-17: 50 ug via INTRAVENOUS
  Administered 2019-11-17: 25 ug via INTRAVENOUS

## 2019-11-17 MED ORDER — 0.9 % SODIUM CHLORIDE (POUR BTL) OPTIME
TOPICAL | Status: DC | PRN
  Administered 2019-11-17: 1000 mL

## 2019-11-17 MED ORDER — ORAL CARE MOUTH RINSE: 15.0000 mL | Freq: Once | OROMUCOSAL | Status: AC

## 2019-11-17 MED ORDER — OXYCODONE HCL 5 MG PO TABS
ORAL_TABLET | ORAL | Status: AC
  Administered 2019-11-18: 15 mg via ORAL
  Filled 2019-11-17: qty 1

## 2019-11-17 MED ORDER — TRANEXAMIC ACID-NACL 1000-0.7 MG/100ML-% IV SOLN
INTRAVENOUS | Status: DC | PRN
  Administered 2019-11-17: 1000 mg via INTRAVENOUS

## 2019-11-17 MED ORDER — TRANEXAMIC ACID-NACL 1000-0.7 MG/100ML-% IV SOLN
INTRAVENOUS | Status: AC
  Filled 2019-11-17: qty 100

## 2019-11-17 MED ORDER — POLYETHYLENE GLYCOL 3350 17 G PO PACK: 17.0000 g | PACK | Freq: Every day | ORAL | Status: DC | PRN

## 2019-11-17 MED ORDER — ONDANSETRON HCL 4 MG/2ML IJ SOLN
INTRAMUSCULAR | Status: AC
  Filled 2019-11-17: qty 2

## 2019-11-17 MED ORDER — ONDANSETRON HCL 4 MG/2ML IJ SOLN
INTRAMUSCULAR | Status: DC | PRN
  Administered 2019-11-17: 4 mg via INTRAVENOUS

## 2019-11-17 MED ORDER — BISACODYL 10 MG RE SUPP: 10.0000 mg | Freq: Every day | RECTAL | Status: DC | PRN

## 2019-11-17 MED ORDER — ALBUMIN HUMAN 5 % IV SOLN: INTRAVENOUS | Status: DC | PRN

## 2019-11-17 SURGICAL SUPPLY — 42 items
BLADE SAW RECIP 87.9 MT (BLADE) ×2 IMPLANT
BLADE SURG 21 STRL SS (BLADE) ×2 IMPLANT
BNDG COHESIVE 6X5 TAN STRL LF (GAUZE/BANDAGES/DRESSINGS) ×2 IMPLANT
BUR WHEEL 25.0 DIAMOND (BURR) ×1 IMPLANT
CANISTER WOUND CARE 500ML ATS (WOUND CARE) IMPLANT
CANISTER WOUNDNEG PRESSURE 500 (CANNISTER) ×1 IMPLANT
COVER SURGICAL LIGHT HANDLE (MISCELLANEOUS) ×2 IMPLANT
DRAPE DERMATAC (DRAPES) ×4 IMPLANT
DRAPE INCISE IOBAN 66X45 STRL (DRAPES) ×3 IMPLANT
DRAPE U-SHAPE 47X51 STRL (DRAPES) ×2 IMPLANT
DRESSING PREVENA PLUS CUSTOM (GAUZE/BANDAGES/DRESSINGS) ×1 IMPLANT
DRSG PREVENA PLUS CUSTOM (GAUZE/BANDAGES/DRESSINGS) ×2
DURAPREP 26ML APPLICATOR (WOUND CARE) ×2 IMPLANT
ELECT REM PT RETURN 9FT ADLT (ELECTROSURGICAL) ×2
ELECTRODE REM PT RTRN 9FT ADLT (ELECTROSURGICAL) ×1 IMPLANT
GLOVE BIOGEL PI IND STRL 7.5 (GLOVE) ×1 IMPLANT
GLOVE BIOGEL PI IND STRL 9 (GLOVE) ×1 IMPLANT
GLOVE BIOGEL PI INDICATOR 7.5 (GLOVE) ×1
GLOVE BIOGEL PI INDICATOR 9 (GLOVE) ×4
GLOVE SURG ORTHO 9.0 STRL STRW (GLOVE) ×3 IMPLANT
GOWN STRL REUS W/ TWL LRG LVL3 (GOWN DISPOSABLE) ×1 IMPLANT
GOWN STRL REUS W/ TWL XL LVL3 (GOWN DISPOSABLE) ×2 IMPLANT
GOWN STRL REUS W/TWL LRG LVL3 (GOWN DISPOSABLE)
GOWN STRL REUS W/TWL XL LVL3 (GOWN DISPOSABLE) ×6
KIT BASIN OR (CUSTOM PROCEDURE TRAY) ×2 IMPLANT
KIT TURNOVER KIT B (KITS) ×2 IMPLANT
MANIFOLD NEPTUNE II (INSTRUMENTS) ×1 IMPLANT
MAT PREVALON FULL STRYKER (MISCELLANEOUS) ×1 IMPLANT
NS IRRIG 1000ML POUR BTL (IV SOLUTION) ×2 IMPLANT
PACK ORTHO EXTREMITY (CUSTOM PROCEDURE TRAY) ×2 IMPLANT
PAD ARMBOARD 7.5X6 YLW CONV (MISCELLANEOUS) ×2 IMPLANT
PREVENA RESTOR ARTHOFORM 46X30 (CANNISTER) ×1 IMPLANT
PREVENA RESTOR AXIOFORM 29X28 (GAUZE/BANDAGES/DRESSINGS) ×1 IMPLANT
SPONGE LAP 18X18 X RAY DECT (DISPOSABLE) ×3 IMPLANT
STAPLER VISISTAT 35W (STAPLE) ×1 IMPLANT
STOCKINETTE IMPERVIOUS LG (DRAPES) ×1 IMPLANT
SUT ETHILON 2 0 PSLX (SUTURE) ×6 IMPLANT
SUT SILK 2 0 (SUTURE) ×2
SUT SILK 2-0 18XBRD TIE 12 (SUTURE) ×1 IMPLANT
TOWEL GREEN STERILE FF (TOWEL DISPOSABLE) ×2 IMPLANT
TUBE CONNECTING 20X1/4 (TUBING) ×2 IMPLANT
YANKAUER SUCT BULB TIP NO VENT (SUCTIONS) ×2 IMPLANT

## 2019-11-17 NOTE — Anesthesia Procedure Notes (Signed)
Procedure Name: LMA Insertion Date/Time: 11/17/2019 9:01 AM Performed by: Tranesha Lessner T, CRNA Pre-anesthesia Checklist: Patient identified, Emergency Drugs available, Suction available and Patient being monitored Patient Re-evaluated:Patient Re-evaluated prior to induction Oxygen Delivery Method: Circle system utilized Preoxygenation: Pre-oxygenation with 100% oxygen Induction Type: IV induction LMA: LMA inserted LMA Size: 5.0 Number of attempts: 1 Placement Confirmation: positive ETCO2 and breath sounds checked- equal and bilateral Tube secured with: Tape Dental Injury: Teeth and Oropharynx as per pre-operative assessment

## 2019-11-17 NOTE — Progress Notes (Signed)
PT places self on CPAP

## 2019-11-17 NOTE — Anesthesia Preprocedure Evaluation (Addendum)
Anesthesia Evaluation  Patient identified by MRN, date of birth, ID band Patient awake    Reviewed: Allergy & Precautions, NPO status , Patient's Chart, lab work & pertinent test results  History of Anesthesia Complications (+) PROLONGED EMERGENCE and history of anesthetic complications  Airway Mallampati: II  TM Distance: >3 FB Neck ROM: Full    Dental  (+) Dental Advisory Given   Pulmonary sleep apnea and Continuous Positive Airway Pressure Ventilation , PE   Pulmonary exam normal        Cardiovascular hypertension, Pt. on medications and Pt. on home beta blockers +CHF  Normal cardiovascular exam+ dysrhythmias Atrial Fibrillation    '14 TTE - EF 50% to 55%. LA was severely dilated. RV was mildly dilated. RA was moderately dilated.     Neuro/Psych  Headaches, negative psych ROS   GI/Hepatic Neg liver ROS, GERD  Controlled,  Endo/Other  diabetes, Type 2Hypothyroidism Morbid obesity  Renal/GU CRFRenal disease     Musculoskeletal  (+) Arthritis ,  Chronic back pain    Abdominal (+) + obese,   Peds  Hematology  (+) Blood dyscrasia, anemia ,   Anesthesia Other Findings Covid negative 11/02/19  Reproductive/Obstetrics                            Anesthesia Physical  Anesthesia Plan  ASA: III  Anesthesia Plan: General   Post-op Pain Management:    Induction: Intravenous  PONV Risk Score and Plan: 3 and Treatment may vary due to age or medical condition, Ondansetron and Dexamethasone  Airway Management Planned: LMA  Additional Equipment: None  Intra-op Plan:   Post-operative Plan: Extubation in OR  Informed Consent: I have reviewed the patients History and Physical, chart, labs and discussed the procedure including the risks, benefits and alternatives for the proposed anesthesia with the patient or authorized representative who has indicated his/her understanding and acceptance.      Dental advisory given  Plan Discussed with: CRNA, Anesthesiologist and Surgeon  Anesthesia Plan Comments: ( )      Anesthesia Quick Evaluation

## 2019-11-17 NOTE — Anesthesia Postprocedure Evaluation (Signed)
Anesthesia Post Note  Patient: [name]  Procedure(s) Performed: AMPUTATION ABOVE KNEE (Left Knee)     Patient location during evaluation: PACU Anesthesia Type: General Level of consciousness: awake and alert Pain management: pain level controlled Vital Signs Assessment: post-procedure vital signs reviewed and stable Respiratory status: spontaneous breathing, nonlabored ventilation and respiratory function stable Cardiovascular status: stable Postop Assessment: no apparent nausea or vomiting Anesthetic complications: no   No complications documented.  Last Vitals:  Vitals:   11/17/19 1118 11/17/19 1125  BP: 101/68   Pulse: 90   Resp: 15   Temp:  36.5 C  SpO2: 98%                  Audry Pili

## 2019-11-17 NOTE — Interval H&P Note (Signed)
History and Physical Interval Note:  11/17/2019 8:02 AM  [name]  has presented today for surgery, with the diagnosis of Septic left total knee.  The various methods of treatment have been discussed with the patient and family. After consideration of risks, benefits and other options for treatment, the patient has consented to  Procedure(s): AMPUTATION ABOVE KNEE (Left) as a surgical intervention.  The patient's history has been reviewed, patient examined, no change in status, stable for surgery.  I have reviewed the patient's chart and labs.  Questions were answered to the patient's satisfaction.     Newt Minion

## 2019-11-17 NOTE — Transfer of Care (Signed)
Immediate Anesthesia Transfer of Care Note  Patient: [name]  Procedure(s) Performed: AMPUTATION ABOVE KNEE (Left Knee)  Patient Location: PACU  Anesthesia Type:General  Level of Consciousness: awake, alert  and oriented  Airway & Oxygen Therapy: Patient Spontanous Breathing and Patient connected to nasal cannula oxygen  Post-op Assessment: Report given to RN, Post -op Vital signs reviewed and stable and Patient moving all extremities  Post vital signs: Reviewed and stable  Last Vitals:  Vitals Value Taken Time  BP 121/77 11/17/19 1102  Temp    Pulse 84 11/17/19 1105  Resp 13 11/17/19 1105  SpO2 98 % 11/17/19 1105  Vitals shown include unvalidated device data.  Last Pain:  Vitals:   11/17/19 0824  TempSrc:   PainSc: 5       Patients Stated Pain Goal: 3 (26/94/85 4627)  Complications: No complications documented.

## 2019-11-17 NOTE — Op Note (Signed)
11/17/2019  10:54 AM  PATIENT:  [name]    PRE-OPERATIVE DIAGNOSIS:  Septic left total knee  POST-OPERATIVE DIAGNOSIS:  Same  PROCEDURE:  AMPUTATION ABOVE KNEE Removal of the revision femoral component using a rim be diamond tip saw. Application of Prevena incisional wound VAC x2 sponges   SURGEON:  Newt Minion, MD  PHYSICIAN ASSISTANT:None ANESTHESIA:   General  PREOPERATIVE INDICATIONS:  [name] is a  69 y.o. male with a diagnosis of Septic left total knee who failed conservative measures and elected for surgical management.  Patient underwent aggressive extensive surgical intervention for limb salvage with revisions of a infected total knee arthroplasty.  Despite excellent aggressive surgical intervention for limb salvage patient was unable to clear his infection and he presents at this time for a above-the-knee amputation.  The risks benefits and alternatives were discussed with the patient preoperatively including but not limited to the risks of infection, bleeding, nerve injury, cardiopulmonary complications, the need for revision surgery, among others, and the patient was willing to proceed.  OPERATIVE IMPLANTS: Praveena wound VAC 2 sponges  @ENCIMAGES @  OPERATIVE FINDINGS: Infection extended proximally in the soft tissue which required extensive soft tissue resection to healthy viable tissue.  Patient's hemoglobin dropped to 8.5 and he was typed and crossed to receive 1 unit of packed red blood cells.  OPERATIVE PROCEDURE: Patient was brought the operating room and underwent a general anesthetic.  After adequate levels anesthesia obtained patient's left lower extremity was prepped using DuraPrep draped into a sterile field a timeout was called.  The surgical incision with the draining infection was prepped out of the sterile field using [place].  A transverse incision was made across the thigh proximal to the surgical incision and this was carried  proximally to stay out of the suprapatellar pouch.  This incision curved proximally and a large posterior flap was created to allow for wound coverage.  This was carried down to the neurovascular bundle medially this was clamped and suture ligated with 2-0 silk the saphenous vein was suture ligated.  A saw was used to amputate through the femur.  The proximal aspect of the revision total knee was well fixed in the bone and removing the proximal aspect would require significant bone damage.  The extension stem on the femoral component was separated at the trunnion and a REM be diamond tipped saw was used to cut through the trunnion to preserve the femur.  This allowed maintenance of the large as long as possible femur for prosthetic fitting.  The amputation was completed the wound was irrigated with normal saline the incisions were kept out of the infected field of the infected total knee.  After further debridement hemostasis was obtained.  The deep and superficial fascia layers were closed using 2-0 nylon the skin was closed using staples the Prevena customizable and axial form dressing was applied this was then covered with derma tack draped this had a good suction fit patient was extubated taken the PACU in stable condition.   DISCHARGE PLANNING:  Antibiotic duration: Continue IV antibiotics  Weightbearing: Nonweightbearing on the left  Pain medication: Opioid pathway with Dilaudid  Dressing care/ Wound VAC: Continue wound VAC for 1 week  Ambulatory devices: Walker  Discharge to: Anticipate discharge to skilled nursing.  Follow-up: In the office 1 week post operative.

## 2019-11-18 ENCOUNTER — Encounter (HOSPITAL_COMMUNITY): Payer: Self-pay | Admitting: Orthopedic Surgery

## 2019-11-18 LAB — TYPE AND SCREEN
ABO/RH(D): O POS
Antibody Screen: NEGATIVE
Unit division: 0
Unit division: 0

## 2019-11-18 LAB — BPAM RBC
Blood Product Expiration Date: 202111052359
Blood Product Expiration Date: 202111052359
ISSUE DATE / TIME: 202110091052
ISSUE DATE / TIME: 202110092001
Unit Type and Rh: 5100
Unit Type and Rh: 5100

## 2019-11-18 LAB — HEMOGLOBIN AND HEMATOCRIT, BLOOD
HCT: 27.9 % — ABNORMAL LOW (ref 39.0–52.0)
Hemoglobin: 8.4 g/dL — ABNORMAL LOW (ref 13.0–17.0)

## 2019-11-18 NOTE — Progress Notes (Signed)
Patient ID: [name], male   DOB: [birth year], 69 y.o.   MRN: [id number] Postoperative day 1 left above-the-knee amputation there is a small amount of serosanguineous drainage in the wound VAC canister it does not have a very good seal but is working. Anticipate discharge to skilled nursing. Patient without complaints this morning.

## 2019-11-18 NOTE — Evaluation (Signed)
Physical Therapy Re-Evaluation Patient Details Name: [name] MRN: [id number] DOB: 1950-02-25 Today's Date: 11/18/2019   History of Present Illness  69 y.o male presenting with septic L TKA s/p AKA on 10/9 by Dr. Sharol Given. PMHx significant for Lt TKR with I&D 11/06/19, L TKA in 1607 complicated by prosthetic joint infection resuliting in multiple subsequent sx., A-fib, hypothyroidism, GERD, and CHF.   Clinical Impression   .Patient is s/p above surgery resulting in functional limitations due to the deficits listed below (see PT Problem List). Comes from home where he lives alone, but has family assist intermittently; Was walking with assistive devices prior to admission; Presents to PT/OT with pain limiting mobility, spasm, decr functional independence, decline from baseline independence; He is familiar with rehab from dealing with his L knee problems for years; Despite pain, he worked hard on therapy evals; He is realistic in his plan to consider going to an ALF for a longer term plan; He definitely needs post-acute rehab, and agrees to SNF -- I'm wondering if CIR for initial post-acute rehab is worth considering; Will ask Rehab AC to weigh in;  Patient will benefit from skilled PT to increase their independence and safety with mobility to allow discharge to the venue listed below.       Follow Up Recommendations Other (comment) (Post-Acute Rehab)    Equipment Recommendations  Wheelchair (measurements PT);Wheelchair cushion (measurements PT);Hospital bed;Other (comment) (perhaps sliding board)    Recommendations for Other Services Rehab consult;Other (comment) (Chaplain (help with grief); ?Amputee Supprt Grp visit?)     Precautions / Restrictions Precautions Precautions: Fall Precaution Comments: VAC L REsidual Limb Restrictions LLE Weight Bearing: Non weight bearing      Mobility  Bed Mobility Overal bed mobility: Needs Assistance Bed Mobility: Supine to Sit;Sit to Supine      Supine to sit: Max assist;+2 for physical assistance;HOB elevated Sit to supine: Max assist;+2 for safety/equipment   General bed mobility comments: Max A +2 for supine <> EOB with use of bed rail and chuck pad. Cues for relaxation, focused breathing and technique.   Transfers Overall transfer level: Needs assistance Equipment used:  (bed pad) Transfers: Lateral/Scoot Transfers          Lateral/Scoot Transfers: +2 physical assistance;Max assist General transfer comment: Patient unable to progress beyond EOB this date 2/2 pain. Simulated lateral scoot technique, moving towards [facility]   Ambulation/Gait                Stairs            Wheelchair Mobility    Modified Rankin (Stroke Patients Only)       Balance     Sitting balance-Leahy Scale: Fair       Standing balance-Leahy Scale: Zero                               Pertinent Vitals/Pain Pain Assessment: Faces Faces Pain Scale: Hurts whole lot Pain Location: L residual limb Pain Descriptors / Indicators: Sharp Pain Intervention(s): Limited activity within patient's tolerance;Monitored during session;RN gave pain meds during session;Repositioned    Home Living Family/patient expects to be discharged to:: Private residence Living Arrangements: Alone Available Help at Discharge: Family Type of Home: House Home Access: Ramped entrance     Home Layout: One level Home Equipment: Environmental consultant - 2 wheels;Walker - 4 wheels;Cane - single point Additional Comments: pt reports he is unable to sit fully on toliet, squats over  it, and he "bird baths" at sink.     Prior Function Level of Independence: Independent with assistive device(s)         Comments: Pt using RW at home prior to this knee surgery     Hand Dominance   Dominant Hand: Right    Extremity/Trunk Assessment   Upper Extremity Assessment Upper Extremity Assessment: Defer to OT evaluation    Lower Extremity Assessment Lower  Extremity Assessment: Generalized weakness;LLE deficits/detail LLE Deficits / Details: L AKA with extensive NPWT dressing at distal aspect of LLE; Very painful with all motion; noting spasm as well    Cervical / Trunk Assessment Cervical / Trunk Assessment: Other exceptions Cervical / Trunk Exceptions: body habitus  Communication   Communication: No difficulties  Cognition Arousal/Alertness: Awake/alert Behavior During Therapy: WFL for tasks assessed/performed Overall Cognitive Status: Within Functional Limits for tasks assessed                                 General Comments: AxO x 3 very pleasant "been through alot" multiple issues with this knee; quietly tearful during session and requesting not to look at his LLE      General Comments      Exercises     Assessment/Plan    PT Assessment Patient needs continued PT services  PT Problem List Decreased strength;Decreased range of motion;Decreased activity tolerance;Decreased balance;Decreased mobility;Decreased knowledge of use of DME;Decreased knowledge of precautions;Obesity       PT Treatment Interventions DME instruction;Gait training;Stair training;Functional mobility training;Therapeutic activities;Therapeutic exercise;Balance training;Patient/family education    PT Goals (Current goals can be found in the Care Plan section)  Acute Rehab PT Goals Patient Stated Goal: Patient in agreement with SNF rehab PT Goal Formulation: With patient Time For Goal Achievement: 12/02/19 Potential to Achieve Goals: Good    Frequency Min 3X/week   Barriers to discharge        Co-evaluation               AM-PAC PT "6 Clicks" Mobility  Outcome Measure Help needed turning from your back to your side while in a flat bed without using bedrails?: A Lot Help needed moving from lying on your back to sitting on the side of a flat bed without using bedrails?: A Lot Help needed moving to and from a bed to a chair  (including a wheelchair)?: A Lot Help needed standing up from a chair using your arms (e.g., wheelchair or bedside chair)?: Total Help needed to walk in hospital room?: Total Help needed climbing 3-5 steps with a railing? : Total 6 Click Score: 9    End of Session Equipment Utilized During Treatment:  (bed pad) Activity Tolerance: No increased pain;Patient tolerated treatment well Patient left: in bed;with call bell/phone within reach Nurse Communication: Mobility status PT Visit Diagnosis: Muscle weakness (generalized) (M62.81);Difficulty in walking, not elsewhere classified (R26.2)    Time: 1327-1410 PT Time Calculation (min) (ACUTE ONLY): 43 min   Charges:   PT Evaluation $PT Re-evaluation: 1 Re-eval          Roney Marion, PT  Acute Rehabilitation Services Pager [phone] Office [place] 11/18/2019, 7:12 PM

## 2019-11-18 NOTE — Progress Notes (Signed)
Pt places self on CPAP 

## 2019-11-18 NOTE — Plan of Care (Signed)

## 2019-11-18 NOTE — Plan of Care (Signed)
  Problem: Education: Goal: Knowledge of General Education information will improve Description: Including pain rating scale, medication(s)/side effects and non-pharmacologic comfort measures Outcome: Progressing   Problem: Activity: Goal: Risk for activity intolerance will decrease Outcome: Progressing   Problem: Nutrition: Goal: Adequate nutrition will be maintained Outcome: Progressing   Problem: Coping: Goal: Level of anxiety will decrease Outcome: Progressing   Problem: Pain Managment: Goal: General experience of comfort will improve Outcome: Progressing   Problem: Skin Integrity: Goal: Risk for impaired skin integrity will decrease Outcome: Progressing   

## 2019-11-18 NOTE — Progress Notes (Signed)
Occupational Therapy Evaluation Patient Details Name: [name] MRN: [id number] DOB: 21-Jan-1951 Today's Date: 11/18/2019    History of Present Illness 69 y.o male presenting with septic L TKA s/p AKA on 10/9 by Dr. Sharol Given. PMHx significant for Lt TKR with I&D 11/06/19, L TKA in 4627 complicated by prosthetic joint infection resuliting in multiple subsequent sx., A-fib, hypothyroidism, GERD, and CHF.    Clinical Impression   PTA patient was living alone in a private residence and was Mod I with BADLs/IADLs with AD. Patient currently presents below baseline level of function requiring Max A +2 for bed mobility and LB BADLs. Unable to progress beyond EOB this date 2/2 pain in L residual limb. Patient education on desensitization of L residual limb with patient reporting phantom limb sensation. Patient has not yet been able to look at residual limb. Patient also limited by decreased strength, decreased activity tolerance, and increased need for assistance from others. Patient would benefit from continued acute OT services in prep for safe d/c to next level of care with recommendation for SNF rehab. Patient in agreement with hopes of transitioning to ALF post SNF stay.     Follow Up Recommendations  SNF    Equipment Recommendations  Other (comment) (Defer to next level of care)    Recommendations for Other Services       Precautions / Restrictions Precautions Precautions: Fall Restrictions Weight Bearing Restrictions: Yes LLE Weight Bearing: Non weight bearing      Mobility Bed Mobility Overal bed mobility: Needs Assistance Bed Mobility: Supine to Sit;Sit to Supine     Supine to sit: Max assist;+2 for physical assistance;HOB elevated Sit to supine: Max assist;+2 for safety/equipment   General bed mobility comments: Max A +2 for supine <> EOB with use of bed rail and chuck pad. Cues for pursed lip breathing and technique.   Transfers Overall transfer level: Needs assistance                General transfer comment: Patient unable to progress beyond EOB this date 2/2 pain.     Balance Overall balance assessment: Needs assistance Sitting-balance support: Feet supported Sitting balance-Leahy Scale: Fair Sitting balance - Comments: UE support on bed surface. Patient able to complete lateral leans R<>L with minimal external assist to return to neutral position.                                    ADL either performed or assessed with clinical judgement   ADL Overall ADL's : Needs assistance/impaired                 Upper Body Dressing : Minimal assistance;Sitting   Lower Body Dressing: Maximal assistance;+2 for physical assistance;Bed level                 General ADL Comments: Patient unable to progress beyond EOB activity this date 2/2 pain in L residual limb. Patient sat EOB ~15 min with and without unilateral UE support on bed surface.      Vision         Perception     Praxis      Pertinent Vitals/Pain Pain Assessment: Faces Faces Pain Scale: Hurts whole lot Pain Location: L residual limb Pain Descriptors / Indicators: Sharp Pain Intervention(s): Limited activity within patient's tolerance;Monitored during session;RN gave pain meds during session;Relaxation;Repositioned     Hand Dominance Right   Extremity/Trunk Assessment Upper  Extremity Assessment Upper Extremity Assessment: Overall WFL for tasks assessed       Cervical / Trunk Assessment Cervical / Trunk Assessment: Other exceptions Cervical / Trunk Exceptions: body habitus   Communication Communication Communication: No difficulties   Cognition Arousal/Alertness: Awake/alert Behavior During Therapy: WFL for tasks assessed/performed Overall Cognitive Status: Within Functional Limits for tasks assessed                                     General Comments  Wound vac at L residual limb    Exercises     Shoulder Instructions       Home Living Family/patient expects to be discharged to:: Private residence Living Arrangements: Alone Available Help at Discharge: Family Type of Home: House Home Access: Ramped entrance     Home Layout: One level     Bathroom Shower/Tub: Teacher, early years/pre: Handicapped height Bathroom Accessibility: No   Home Equipment: Environmental consultant - 2 wheels;Walker - 4 wheels;Cane - single point   Additional Comments: pt reports he is unable to sit fully on toliet, squats over it, and he "bird baths" at sink.       Prior Functioning/Environment Level of Independence: Independent with assistive device(s)        Comments: Pt using RW at home prior to this knee surgery        OT Problem List: Decreased strength;Decreased activity tolerance;Impaired balance (sitting and/or standing);Decreased knowledge of use of DME or AE;Decreased knowledge of precautions;Obesity;Pain      OT Treatment/Interventions: Self-care/ADL training;Therapeutic exercise;Energy conservation;DME and/or AE instruction;Therapeutic activities;Patient/family education;Balance training    OT Goals(Current goals can be found in the care plan section) Acute Rehab OT Goals Patient Stated Goal: Patient in agreement with SNF rehab OT Goal Formulation: With patient Time For Goal Achievement: 12/02/19 Potential to Achieve Goals: Fair ADL Goals Pt Will Perform Upper Body Dressing: with set-up Pt Will Perform Lower Body Dressing: with mod assist;with adaptive equipment;sitting/lateral leans;sit to/from stand Pt Will Transfer to Toilet: with mod assist;bedside commode Pt Will Perform Toileting - Clothing Manipulation and hygiene: with mod assist;sitting/lateral leans;with adaptive equipment Additional ADL Goal #1: Patient will self-report use of 3 L residual limb desensitization techniques.  OT Frequency: Min 2X/week   Barriers to D/C: Decreased caregiver support;Inaccessible home environment           Co-evaluation              AM-PAC OT "6 Clicks" Daily Activity     Outcome Measure Help from another person eating meals?: None Help from another person taking care of personal grooming?: A Little Help from another person toileting, which includes using toliet, bedpan, or urinal?: Total Help from another person bathing (including washing, rinsing, drying)?: A Lot Help from another person to put on and taking off regular upper body clothing?: A Little Help from another person to put on and taking off regular lower body clothing?: Total 6 Click Score: 14   End of Session Nurse Communication: Patient requests pain meds  Activity Tolerance: Patient limited by pain Patient left: in bed;with call bell/phone within reach;with bed alarm set  OT Visit Diagnosis: Unsteadiness on feet (R26.81);Other abnormalities of gait and mobility (R26.89);Pain Pain - Right/Left: Left Pain - part of body:  (Residual limb)                Time: 1328-1410 OT Time Calculation (min): 42 min Charges:  OT General Charges $OT Visit: 1 Visit OT Evaluation $OT Eval Moderate Complexity: 1 Mod OT Treatments $Self Care/Home Management : 8-22 mins  [name] H. OTR/L Supplemental OT, Department of rehab services [phone]  Kalyani Maeda R H. 11/18/2019, 2:38 PM

## 2019-11-19 MED ORDER — RIVAROXABAN 20 MG PO TABS
20.0000 mg | ORAL_TABLET | Freq: Every day | ORAL | Status: DC
  Administered 2019-11-19 – 2019-11-22 (×4): 20 mg via ORAL
  Filled 2019-11-19 (×4): qty 1

## 2019-11-19 NOTE — NC FL2 (Addendum)
[place] LEVEL OF CARE SCREENING TOOL     IDENTIFICATION  Patient Name: [name] Birthdate: January 16, 1951 Sex: male Admission Date (Current Location): 11/06/2019  [facility] and [place] Number:  Herbalist and Address:  [facility] [place] [place] [address], [place], [place] [address]      Provider Number: [id number]  Attending Physician Name and Address:  Paralee Cancel, MD  Relative Name and Phone Number:       Current Level of Care: Hospital Recommended Level of Care: [place] Prior Approval Number:    Date Approved/Denied:   PASRR Number:  [id number]  Discharge Plan: SNF    Current Diagnoses: Patient Active Problem List   Diagnosis Date Noted  . Infection of prosthetic left knee joint ([place]) 11/08/2019  . Septic joint of left knee joint ([place]) 11/06/2019  . 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[place]) 08/15/2019  . Open knee wound 08/14/2019  . S/P left TK revision 04/03/2019  . Administration of long-term prophylactic antibiotics   . History of streptococcal infection   . History of DVT (deep vein thrombosis)   . S/P rev left TK 12/15/2015  . 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 Preoperative cardiovascular examination 04/17/2013  . OSA on CPAP 11/27/2012  . Chronic diastolic heart failure ([place]) 09/10/2012  . Chronic anticoagulation, with Xarelto 09/10/2012  . DVT (deep venous thrombosis), possible 09/10/2012  . SOB (shortness of breath) 08/26/2012  . Chest discomfort 08/26/2012  . Persistent atrial fibrillation ([place]) 08/26/2012  . Super obesity 06/27/2012  . Expected blood loss anemia 06/27/2012  . S/P left TK revision 06/23/2012  . Septic arthritis of knee ([place])   . Cellulitis 09/19/2010  . METHICILLIN RESISTANT STAPHYLOCOCCUS AUREUS INFECTION 09/10/2009  . 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 DM 07/04/2008  . CHRONIC KIDNEY DISEASE UNSPECIFIED 07/04/2008  . PULMONARY  EMBOLISM, HX OF 07/04/2008  . INFECTION DUE TO INTERNAL ORTH DEVICE NEC 03/02/2006    Orientation RESPIRATION BLADDER Height & Weight     Self, Time, Situation, Place  Normal Continent Weight: (!) 176.1 kg Height:  5\' 8"  (172.7 cm)  BEHAVIORAL SYMPTOMS/MOOD NEUROLOGICAL BOWEL NUTRITION STATUS      Continent Diet (Refer to d/c summary)  AMBULATORY STATUS COMMUNICATION OF NEEDS Skin   Extensive Assist Verbally Surgical wounds, Wound Vac (10/9 s/p L  AMPUTATION ABOVE KNEE with Application of Prevena)                       Personal Care Assistance Level of Assistance  Bathing, Feeding, Dressing Bathing Assistance: Maximum assistance Feeding assistance: Limited assistance Dressing Assistance: Maximum assistance     Functional Limitations Info  Sight, Hearing, Speech Sight Info: Adequate Hearing Info: Adequate Speech Info: Adequate    SPECIAL CARE FACTORS FREQUENCY  PT (By licensed PT), OT (By licensed OT)     PT Frequency: 5x/week, evaluate and treat OT Frequency: 5x/week, evaluate and treat            Contractures Contractures Info: Not present    Additional Factors Info  Code Status, Allergies Code Status Info: Full Code Allergies Info: Morphine           Current Medications (11/19/2019):  This is the current hospital active medication list Current Facility-Administered Medications  Medication Dose Route Frequency Provider Last Rate Last Admin  . 0.9 %  sodium chloride infusion (Manually program via Guardrails IV Fluids)   Intravenous Once  Newt Minion, MD      . 0.9 %  sodium chloride infusion   Intravenous Continuous Newt Minion, MD 75 mL/hr at 11/18/19 2001 New Bag at 11/18/19 2001  . 0.9 %  sodium chloride infusion   Intravenous Continuous Newt Minion, MD 10 mL/hr at 11/17/19 1313 New Bag at 11/17/19 1313  . acetaminophen (TYLENOL) tablet 325-650 mg  325-650 mg Oral Q6H PRN Newt Minion, MD      . alum & mag hydroxide-simeth (MAALOX/MYLANTA)  200-200-20 MG/5ML suspension 15 mL  15 mL Oral Q4H PRN Newt Minion, MD      . ampicillin (OMNIPEN) 2 g in sodium chloride 0.9 % 100 mL IVPB  2 g Intravenous Q4H Newt Minion, MD 300 mL/hr at 11/19/19 0930 2 g at 11/19/19 0930  . bisacodyl (DULCOLAX) suppository 10 mg  10 mg Rectal Daily PRN Newt Minion, MD      . Chlorhexidine Gluconate Cloth 2 % PADS 6 each  6 each Topical Daily Newt Minion, MD   6 each at 11/16/19 1048  . diltiazem (CARDIZEM CD) 24 hr capsule 240 mg  240 mg Oral Daily Newt Minion, MD   240 mg at 11/19/19 0923  . diphenhydrAMINE (BENADRYL) 12.5 MG/5ML elixir 12.5-25 mg  12.5-25 mg Oral Q4H PRN Newt Minion, MD      . docusate sodium (COLACE) capsule 100 mg  100 mg Oral BID Newt Minion, MD   100 mg at 11/19/19 0924  . ferrous sulfate tablet 325 mg  325 mg Oral BID WC Newt Minion, MD   325 mg at 11/19/19 1610  . furosemide (LASIX) tablet 20 mg  20 mg Oral Daily PRN Newt Minion, MD      . HYDROmorphone (DILAUDID) injection 0.5-1 mg  0.5-1 mg Intravenous Q2H PRN Newt Minion, MD   0.5 mg at 11/18/19 1357  . levothyroxine (SYNTHROID) tablet 125 mcg  125 mcg Oral Q0600 Newt Minion, MD   125 mcg at 11/19/19 [phone]  . magnesium citrate solution 1 Bottle  1 Bottle Oral Once PRN Newt Minion, MD      . menthol-cetylpyridinium (CEPACOL) lozenge 3 mg  1 lozenge Oral PRN Newt Minion, MD       Or  . phenol (CHLORASEPTIC) mouth spray 1 spray  1 spray Mouth/Throat PRN Newt Minion, MD      . methocarbamol (ROBAXIN) tablet 500 mg  500 mg Oral Q6H PRN Newt Minion, MD   500 mg at 11/19/19 0501   Or  . methocarbamol (ROBAXIN) 500 mg in dextrose 5 % 50 mL IVPB  500 mg Intravenous Q6H PRN Newt Minion, MD      . metoCLOPramide (REGLAN) tablet 5-10 mg  5-10 mg Oral Q8H PRN Newt Minion, MD       Or  . metoCLOPramide (REGLAN) injection 5-10 mg  5-10 mg Intravenous Q8H PRN Newt Minion, MD      . metoprolol tartrate (LOPRESSOR) tablet 50 mg  50 mg Oral BID  Newt Minion, MD   50 mg at 11/18/19 0856  . ondansetron (ZOFRAN) tablet 4 mg  4 mg Oral Q6H PRN Newt Minion, MD       Or  . ondansetron [facility]) injection 4 mg  4 mg Intravenous Q6H PRN Newt Minion, MD      . oxyCODONE (Oxy IR/ROXICODONE) immediate release tablet 10-15 mg  10-15 mg  Oral Q4H PRN Newt Minion, MD   15 mg at 11/19/19 9242  . oxyCODONE (Oxy IR/ROXICODONE) immediate release tablet 5-10 mg  5-10 mg Oral Q4H PRN Newt Minion, MD      . polyethylene glycol (MIRALAX / GLYCOLAX) packet 17 g  17 g Oral BID Newt Minion, MD   17 g at 11/19/19 0924  . polyethylene glycol (MIRALAX / GLYCOLAX) packet 17 g  17 g Oral Daily PRN Newt Minion, MD      . rivaroxaban [facility]) tablet 20 mg  20 mg Oral Daily Newt Minion, MD   20 mg at 11/19/19 1013  . sodium chloride flush (NS) 0.9 % injection 10-40 mL  10-40 mL Intracatheter Q12H Newt Minion, MD   10 mL at 11/18/19 0856  . sodium chloride flush (NS) 0.9 % injection 10-40 mL  10-40 mL Intracatheter PRN Newt Minion, MD         Discharge Medications: Please see discharge summary for a list of discharge medications.  Relevant Imaging Results:  Relevant Lab Results:   Additional Information SS# [id number]  Sharin Mons, RN

## 2019-11-19 NOTE — Progress Notes (Signed)
Inpatient Rehab Admissions Coordinator Note:   Per PT recommendations, pt was screened for CIR candidacy by Shann Medal, PT, DPT.  At this time we are recommending a CIR consult. I will contact the MD for an order.  Please contact me with questions.   Shann Medal, PT, DPT [phone] 11/19/19 12:51 PM

## 2019-11-19 NOTE — Progress Notes (Signed)
Patient ID: [name], male   DOB: [birth year], 69 y.o.   MRN: [id number]  Visited with Mr. Parker this am to check in with him. He appears to be doing well relatively comfortable other than movement.  I appreciate Dr Jess Barters assistance with his amputation  D/C plan per Dr Sharol Given

## 2019-11-19 NOTE — Progress Notes (Signed)
Patient ID: [name], male   DOB: November 14, 1950, 69 y.o.   MRN: [id number] Patient is postoperative day two left above-knee amputation there is 100 cc of the wound VAC canister.  Patient has had some leakage around the track pad nursing was given instructions of how to reinforce the track pad differently.  Anticipate discharge to skilled nursing facility.

## 2019-11-19 NOTE — TOC Progression Note (Addendum)
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 From home alone. Supportive family. RNCM received consult for possible SNF placement at time of discharge. RNCM spoke with patient regarding PT recommendation of SNF placement at time of discharge. Patient reported that he is currently unable to care for self independently  given his current physical needs and fall risk. Patient expressed understanding of PT recommendation and is agreeable to SNF placement at time of discharge. Patient reports no preference for SNF. RNCM discussed insurance authorization process and provided Medicare SNF ratings list. Patient expressed being hopeful for rehab and to feel better soon. No further questions reported at this time. RNCM to continue to follow and assist with discharge planning needs.   Expected Discharge Plan: [place] Barriers to Discharge: Continued Medical Work up  Expected Discharge Plan and Services Expected Discharge Plan: [place] In-house Referral: Clinical Social Work   Post Acute Care Choice: Durable Medical Equipment Living arrangements for the past 2 months: [place]: RN, PT, IV Antibiotics [place], Other - See comment (and [facility]) Date HH Agency Contacted: 11/08/19 Time HH Agency Contacted: 1200 Representative spoke with at [place]: Carolynn Sayers with [facility];  Amy Hyatt with [facility]   Social Determinants of Health (SDOH) Interventions    Readmission Risk Interventions Readmission Risk Prevention Plan 11/09/2019  Transportation Screening Complete  PCP or Specialist Appt within 5-7 Days Complete  Home Care  Screening Complete  Medication Review (RN CM) Complete  Some recent data might be hidden

## 2019-11-19 NOTE — Progress Notes (Signed)
Physical Therapy Note  I have reached out to a contact at [facility] re: setting up for a peer visit from the [place]. Will keep the Care Team updated re: scheduling a possible visit.  Roney Marion, [place]  Acute Rehabilitation Services Pager [phone] Office [phone]

## 2019-11-19 NOTE — Progress Notes (Signed)
RT placed CPAP at bedside. Patient stated he will place himself on when ready. RT instructed patient to have RT called if assistance is needed. RT will monitor as needed.

## 2019-11-20 ENCOUNTER — Ambulatory Visit: Payer: Medicare Other | Admitting: Cardiovascular Disease

## 2019-11-20 LAB — SARS CORONAVIRUS 2 BY RT PCR (HOSPITAL ORDER, PERFORMED IN ~~LOC~~ HOSPITAL LAB): SARS Coronavirus 2: NEGATIVE

## 2019-11-20 LAB — SURGICAL PATHOLOGY

## 2019-11-20 MED ORDER — OXYCODONE HCL 5 MG PO TABS: 5.0000 mg | ORAL_TABLET | ORAL | 0 refills | Status: DC | PRN

## 2019-11-20 NOTE — Progress Notes (Signed)
CPAP at bedside. Patient stated he would place himself on when ready without assistance. RT will monitor as needed.

## 2019-11-20 NOTE — Progress Notes (Signed)
Physical Therapy Treatment Patient Details Name: [name] MRN: [id number] DOB: 1950-12-07 Today's Date: 11/20/2019    History of Present Illness 69 y.o male presenting with septic L TKA s/p AKA on 10/9 by Dr. Sharol Given. 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     PT Comments    Requested per NT and RN for transfer back to bed due to patient request to return to bed. Patient requested use of STEDY to return to bed, brought STEDY in and positioned appropriately. It was apparent to patient that body girth would not going to work with width of STEDY. Plan was changed to [facility] with +3 assistance for safety and assistance with L residual limb. Patient tolerated transfer well with some increase in pain. Patient will continue to benefit from skilled PT services during acute stay to improve functional mobility prior to discharge. Recommend SNF upon discharge to maximize functional mobility. PT will continue to follow.    Follow Up Recommendations  SNF     Equipment Recommendations  Wheelchair (measurements PT);Wheelchair cushion (measurements PT);Hospital bed;Other (comment) (sliding board)    Recommendations for Other Services       Precautions / Restrictions Precautions Precautions: Fall Precaution Comments: VAC L residual Restrictions Weight Bearing Restrictions: Yes LLE Weight Bearing: Non weight bearing    Mobility  Bed Mobility Overal bed mobility: Needs Assistance Bed Mobility: Rolling Rolling: Mod assist;+2 for physical assistance   Supine to sit: Max assist;+2 for physical assistance;HOB elevated     General bed mobility comments: attempted R roll but pt was unable to tolerate due to increased pain. HOB elevated and pivoted to EOB with max A +2 at trunk and hips  Transfers Overall transfer level: Needs assistance Equipment used:  Rolling [place] (2 wheeled) Transfers: Lateral/Scoot Transfers Sit to Stand: Max assist;+2 physical assistance        Lateral/Scoot Transfers: +2 physical assistance;Max assist General transfer comment: Pt verbalized wanting to try STEDY for transfer. Brought in [place], positioned appropriately. It as apparent to patient that body girth was not going to work with width of STEDY. Plan was changed to utilize the [facility] with +3 assistance for safety and assistance with L residual limb   Ambulation/Gait             General Gait Details: unable   Stairs             Wheelchair Mobility    Modified Rankin (Stroke Patients Only)       Balance Overall balance assessment: Needs assistance Sitting-balance support: Feet supported Sitting balance-Leahy Scale: Fair                                      Cognition Arousal/Alertness: Awake/alert Behavior During Therapy: WFL for tasks assessed/performed Overall Cognitive Status: Within Functional Limits for tasks assessed                                 General Comments: pt emotional about whole situation. Had not seen residual limb yet but did during this session and did touch it which was encouraging      Exercises Total Joint Exercises Ankle Circles/Pumps: AROM;Right;20 reps;Seated Amputee Exercises Gluteal Sets: AROM;10 reps Hip ABduction/ADduction:  (attempted but pt had increased  pain) Hip Flexion/Marching: AROM;Left;10 reps;Supine    General Comments General comments (skin integrity, edema, etc.): discussed phantom pain and mgmt      Pertinent Vitals/Pain Pain Assessment: Faces Pain Score: 5  Faces Pain Scale: Hurts whole lot Pain Location: L residual limb Pain Descriptors / Indicators: Sharp Pain Intervention(s): Monitored during session;Repositioned    Home Living                      Prior Function            PT Goals (current goals can now be found in the care  plan section) Acute Rehab PT Goals Patient Stated Goal: Patient in agreement with SNF rehab PT Goal Formulation: With patient Time For Goal Achievement: 12/02/19 Potential to Achieve Goals: Good Progress towards PT goals: Progressing toward goals    Frequency    Min 3X/week      PT Plan Current plan remains appropriate    Co-evaluation              AM-PAC PT "6 Clicks" Mobility   Outcome Measure  Help needed turning from your back to your side while in a flat bed without using bedrails?: A Lot Help needed moving from lying on your back to sitting on the side of a flat bed without using bedrails?: A Lot Help needed moving to and from a bed to a chair (including a wheelchair)?: Total Help needed standing up from a chair using your arms (e.g., wheelchair or bedside chair)?: Total Help needed to walk in hospital room?: Total Help needed climbing 3-5 steps with a railing? : Total 6 Click Score: 8    End of Session Equipment Utilized During Treatment: Other (comment) [facility]) Activity Tolerance: Patient tolerated treatment well Patient left: in bed;with call bell/phone within reach;with bed alarm set;with family/visitor present Nurse Communication: Need for lift equipment PT Visit Diagnosis: Muscle weakness (generalized) (M62.81);Difficulty in walking, not elsewhere classified (R26.2)     Time: 3016-0109 PT Time Calculation (min) (ACUTE ONLY): 20 min  Charges:  $Therapeutic Activity: 8-22 mins                     Perrin Maltese, PT, DPT Acute Rehabilitation Services Pager [phone] Office [phone]    Melene Plan Allred 11/20/2019, 11:21 AM

## 2019-11-20 NOTE — TOC Progression Note (Addendum)
Transition of Care [facility]) - Progression Note    Patient Details  Name: [name] MRN: [id number] Date of Birth: 04-Dec-1950  Transition of Care [facility]) CM/SW Contact  Sharin Mons, RN Phone Number: 11/20/2019, 3:36 PM  Clinical Narrative:    Pt with one noted bed offer, [place]. NCM shared with pt and pt accepted offer. NCM made [facility] admissions made aware and extended bed offer, however, bed will not be available until Thursday. Insurance authorization is pending also...  TOC team will continue to monitor and follow.  11/20/2019 @1658   SNF Approval for 3 days, 10/12-10/14 ( next review), Navi ID [id number], Arist Dagout SICC   Expected Discharge Plan: [place] Barriers to Discharge: No SNF bed, Ship broker, Continued Medical Work up  Expected Discharge Plan and Services Expected Discharge Plan: [place] In-house Referral: Clinical Social Work   Post Acute Care Choice: Museum/gallery conserv[employer] Living arrangements for the past 2 months: Single Family Home                           HH Arranged: RN, PT, IV Antibiotics HH Agency: [facility], Other - See comment (and [facility]) Date HH Agency Contacted: 11/08/19 Time HH Agency Contacted: 1200 Representative spoke with at [place]: Carolynn Sayers with [facility];  Amy Hyatt with [facility]   Social Determinants of Health (SDOH) Interventions    Readmission Risk Interventions Readmission Risk Prevention Plan 11/09/2019  Transportation Screening Complete  PCP or Specialist Appt within 5-7 Days Complete  Home Care Screening Complete  Medication Review (RN CM) Complete  Some recent data might be hidden

## 2019-11-20 NOTE — Progress Notes (Signed)
Physical Therapy Treatment Patient Details Name: [name] MRN: [id number] DOB: 12/15/1950 Today's Date: 11/20/2019    History of Present Illness 69 y.o male presenting with septic L TKA s/p AKA on 10/9 by Dr. Sharol Given. 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     PT Comments    Pt scooted to chair for first time today. Was very anxious about this and required max A +2 and use of sliding board. Pt attempted standing on RLE, 2 trials for strengthening but unable to clear buttocks from bed. Will require maximove for return to bed. Pt and nsg staff educated on this. PT will continue to follow.    Follow Up Recommendations  SNF     Equipment Recommendations  Wheelchair (measurements PT);Wheelchair cushion (measurements PT);Hospital bed;Other (comment) (sliding board)    Recommendations for Other Services       Precautions / Restrictions Precautions Precautions: Fall Precaution Comments: VAC L REsidual Limb Restrictions Weight Bearing Restrictions: Yes LLE Weight Bearing: Non weight bearing    Mobility  Bed Mobility Overal bed mobility: Needs Assistance Bed Mobility: Supine to Sit     Supine to sit: Max assist;+2 for physical assistance;HOB elevated     General bed mobility comments: attempted R roll but pt was unable to tolerate due to increased pain. HOB elevated and pivoted to EOB with max A +2 at trunk and hips  Transfers Overall transfer level: Needs assistance Equipment used: Rolling walker (2 wheeled) Transfers: Lateral/Scoot Transfers Sit to Stand: Max assist;+2 physical assistance        Lateral/Scoot Transfers: +2 physical assistance;Max assist General transfer comment: pt attempted sit>stand with use of RW for UE strengthening and RLE activation. 2 trials, pt was unable to clear buttocks from bed but was able to fwd wt  shift on to RLE. Then used slide board to scoot to R with max A +2 and use of pad.   Ambulation/Gait             General Gait Details: unable   Stairs             Wheelchair Mobility    Modified Rankin (Stroke Patients Only)       Balance Overall balance assessment: Needs assistance Sitting-balance support: Feet supported Sitting balance-Leahy Scale: Fair                                      Cognition Arousal/Alertness: Awake/alert Behavior During Therapy: WFL for tasks assessed/performed;Anxious Overall Cognitive Status: Within Functional Limits for tasks assessed                                 General Comments: pt emotional about whole situation. Had not seen residual limb yet but did during this session and did touch it which was encouraging      Exercises Total Joint Exercises Ankle Circles/Pumps: AROM;Right;20 reps;Seated Amputee Exercises Gluteal Sets: AROM;10 reps Hip ABduction/ADduction:  (attempted but pt had increased pain) Hip Flexion/Marching: AROM;Left;10 reps;Supine    General Comments General comments (skin integrity, edema, etc.): discussed phantom pain and mgmt      Pertinent Vitals/Pain Pain Assessment: 0-10 Pain Score: 5  Pain Location: L residual limb Pain Descriptors / Indicators: Sharp Pain Intervention(s): Limited activity  within patient's tolerance;Monitored during session    Home Living                      Prior Function            PT Goals (current goals can now be found in the care plan section) Acute Rehab PT Goals Patient Stated Goal: Patient in agreement with SNF rehab PT Goal Formulation: With patient Time For Goal Achievement: 12/02/19 Potential to Achieve Goals: Good Progress towards PT goals: Progressing toward goals    Frequency    Min 3X/week      PT Plan Discharge plan needs to be updated    Co-evaluation              AM-PAC PT "6 Clicks" Mobility    Outcome Measure  Help needed turning from your back to your side while in a flat bed without using bedrails?: A Lot Help needed moving from lying on your back to sitting on the side of a flat bed without using bedrails?: A Lot Help needed moving to and from a bed to a chair (including a wheelchair)?: Total Help needed standing up from a chair using your arms (e.g., wheelchair or bedside chair)?: Total Help needed to walk in hospital room?: Total Help needed climbing 3-5 steps with a railing? : Total 6 Click Score: 8    End of Session Equipment Utilized During Treatment:  (bed pad, sliding board) Activity Tolerance: Patient limited by pain Patient left: with call bell/phone within reach;in chair Nurse Communication: Mobility status PT Visit Diagnosis: Muscle weakness (generalized) (M62.81);Difficulty in walking, not elsewhere classified (R26.2)     Time: 0900-0930 PT Time Calculation (min) (ACUTE ONLY): 30 min  Charges:  $Therapeutic Activity: 23-37 mins                     Leighton Roach, [place]  Pager [phone] Office [place] 11/20/2019, 11:09 AM

## 2019-11-20 NOTE — Plan of Care (Signed)
  Problem: Education: Goal: Knowledge of General Education information will improve Description: Including pain rating scale, medication(s)/side effects and non-pharmacologic comfort measures Outcome: Progressing   Problem: Health Behavior/Discharge Planning: Goal: Ability to manage health-related needs will improve Outcome: Progressing   Problem: Activity: Goal: Risk for activity intolerance will decrease Outcome: Progressing   Problem: Coping: Goal: Level of anxiety will decrease Outcome: Progressing   Problem: Elimination: Goal: Will not experience complications related to bowel motility Outcome: Progressing   

## 2019-11-20 NOTE — Progress Notes (Signed)
   11/19/19 1158  Clinical Encounter Type  Visited With Patient  Visit Type Initial  Referral From Nurse  Consult/Referral To Chaplain  Spiritual Encounters  Spiritual Needs Other (Comment) (Life Changes )  Chaplain visited with [name]. We talked a little about his life changing transitions and he asked if I could come back Friday to talk more.  Chaplain Byrant Valent Morgan-Simpson [phone]

## 2019-11-20 NOTE — Plan of Care (Signed)

## 2019-11-20 NOTE — Progress Notes (Signed)
Patient is postop day 3 status post above-knee amputation.  Wound VAC is not beeping but does not have green checks.  Does appear to be suctioning fluid.  There is a little over 100 cc in the canister.  Patient is alert and awake appropriate to conversation   Vital signs stable afebrile.   Plan is to discharge to skilled nursing facility patient is in agreement this plan as he will need help for more than likely more than 2 weeks.  Will be discharged on Praveena pump.  Will order Covid test today and leave a pain prescription on his chart anticipate discharge when nursing home bed available

## 2019-11-21 MED ORDER — NAPHAZOLINE-GLYCERIN 0.012-0.2 % OP SOLN: 1.0000 [drp] | Freq: Four times a day (QID) | OPHTHALMIC | 0 refills | Status: DC | PRN

## 2019-11-21 MED ORDER — NAPHAZOLINE-GLYCERIN 0.012-0.2 % OP SOLN
1.0000 [drp] | Freq: Four times a day (QID) | OPHTHALMIC | Status: DC | PRN
  Administered 2019-11-22: 1 [drp] via OPHTHALMIC
  Filled 2019-11-21: qty 15

## 2019-11-21 NOTE — Plan of Care (Signed)

## 2019-11-21 NOTE — TOC Progression Note (Addendum)
Transition of Care [facility]) - Progression Note    Patient Details  Name: [name] MRN: [id number] Date of Birth: [birth year]  Transition of Care [facility]) CM/SW Contact  Sharin Mons, RN Phone Number: 11/21/2019, 11:52 AM  Clinical Narrative:    Insurance auth.  approval received on 11/20/2019 @ 1658 for 3 days , next review 10/14, Arist Dagout SICC ( [phone], [email or web address]). Navi ID [id number]. NCM made pt aware. Per [facility] admissions rep. bed will be available 10/14, pending medical readiness. TOC team will continue to monitor.   Expected Discharge Plan: [place] [facility]) Barriers to Discharge: No SNF bed  Expected Discharge Plan and Services Expected Discharge Plan: [place] [facility]) In-house Referral: Clinical Social Work   Post Acute Care Choice: Durable Medical Equipment Living arrangements for the past 2 months: Single Family Home Expected Discharge Date: 11/22/19                         HH Arranged: RN, PT, IV Antibiotics [place], Other - See comment (and [facility]) Date HH Agency Contacted: 11/08/19 Time HH Agency Contacted: 1200 Representative spoke with at [place]: Carolynn Sayers with [employer];  Amy Hyatt with [facility]   Social Determinants of Health (SDOH) Interventions    Readmission Risk Interventions Readmission Risk Prevention Plan 11/09/2019  Transportation Screening Complete  PCP or Specialist Appt within 5-7 Days Complete  Home Care Screening Complete  Medication Review (RN CM) Complete  Some recent data might be hidden

## 2019-11-21 NOTE — Progress Notes (Signed)
Pt has CPAP at bedside. He stated he could put it on himself without any assistance.

## 2019-11-21 NOTE — Progress Notes (Signed)
Is postop day 4 status post above-knee amputation.  He is complaining of dry eyes this morning.  Denies any sharp pain or visual changes.  Also complaining of some soreness in his stump this morning.  Vital signs stable afebrile wound VAC does not have any green checks but is not alarming no further drainage since yesterday.  Patient is lying in bed speaking on the phone.  Will order Clear Eyes today.  Hopefully this will help with some of his irritation.  Plan for discharge to skilled nursing tomorrow if insurance approved.  Will be discharged with Prevena pump if working well enough to do this.  Follow-up in office in 1 week

## 2019-11-21 NOTE — Plan of Care (Signed)

## 2019-11-21 NOTE — Care Management Important Message (Signed)
Important Message  Patient Details  Name: [name] MRN: [id number] Date of Birth: July 25, 1950   Medicare Important Message Given:  Yes - Important Message mailed due to current National Emergency  Verbal consent obtained due to current National Emergency  Relationship to patient: Self Contact Name: Karlo Goeden Call Date: 11/21/19  Time: 1201 Phone: [phone] Outcome: Spoke with contact Important Message mailed to: Patient address on file    [place] 11/21/2019, 12:01 PM

## 2019-11-22 MED ORDER — HEPARIN SOD (PORK) LOCK FLUSH 100 UNIT/ML IV SOLN
250.0000 [IU] | INTRAVENOUS | Status: AC | PRN
  Administered 2019-11-22 (×2): 250 [IU]
  Filled 2019-11-22: qty 2.5

## 2019-11-22 NOTE — Progress Notes (Signed)
Repiort given to [employer], Staff nurse at [facility], all questions and concerns were fully answered.

## 2019-11-22 NOTE — Progress Notes (Signed)
Physical Therapy Treatment Patient Details Name: [name] MRN: [id number] DOB: [birth year] Today's Date: 11/22/2019    History of Present Illness 69 y.o male presenting with septic L TKA s/p AKA on 10/9 by Dr. Sharol Given. 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     PT Comments    Pt making progress with bed mobility, requiring less assist for supine<>sitting (+32modA), but continues to require +46maxA for lateral seated scooting along edge of bed. Pt reports moderate dizziness with transition to edge of bed, and DBP elevated (seated BP 132/112). Pt tolerated sitting EOB ~8 minutes with Supervision and performed static sitting and weight shifting/reaching activities. Pt returned to supine and BP improved to 110/83 (92). RN notified pt wound dressing on LLE filled with dark fluid and pt asking questions about dressing change, MD also notified. Pt given HEP handout for amputee exercises to continue TID as able as verbally reviewed with pt during session. Pt continues to benefit from PT services to progress toward functional mobility goals. D/C recs below remain appropriate at this time.  Follow Up Recommendations  SNF     Equipment Recommendations  Wheelchair (measurements PT);Wheelchair cushion (measurements PT);Hospital bed;Other (comment) (sliding board)    Recommendations for Other Services Rehab consult;Other (comment)     Precautions / Restrictions Precautions Precautions: Fall Precaution Comments: VAC L residual Restrictions Weight Bearing Restrictions: Yes LLE Weight Bearing: Non weight bearing    Mobility  Bed Mobility Overal bed mobility: Needs Assistance Bed Mobility: Rolling Rolling: Mod assist   Supine to sit: Mod assist;+2 for physical assistance;HOB elevated Sit to supine: Mod assist;+2 for physical assistance;HOB elevated    General bed mobility comments: trunk and hip assist, pt with heavy use of bed features  Transfers Overall transfer level: Needs assistance              Lateral/Scoot Transfers: +2 physical assistance;Max assist General transfer comment: +12maxA for lateral seated scoot along EOB with transfer pad assist  Ambulation/Gait                 Stairs             Wheelchair Mobility    Modified Rankin (Stroke Patients Only)       Balance Overall balance assessment: Needs assistance Sitting-balance support: Feet supported Sitting balance-Leahy Scale: Fair Sitting balance - Comments: UE support on bed surface. Patient able to complete lateral leans R<>L with minimal external assist to return to neutral position.                                     Cognition Arousal/Alertness: Awake/alert Behavior During Therapy: WFL for tasks assessed/performed Overall Cognitive Status: Within Functional Limits for tasks assessed                                        Exercises Total Joint Exercises Gluteal Sets: AROM;10 reps;Supine Amputee Exercises Hip Extension: AROM;5 reps;Supine;Left Other Exercises Other Exercises: Reviewed amputee HEP (see link)    General Comments General comments (skin integrity, edema, etc.): increased dark fluid/blood observed in L LE dressing, RN/MD notified      Pertinent Vitals/Pain Pain Assessment: 0-10 Pain Score: 5  Pain Location: L residual limb  Pain Descriptors / Indicators: Tightness;Numbness Pain Intervention(s): Monitored during session;Repositioned (pt deferring ice)    Home Living                      Prior Function            PT Goals (current goals can now be found in the care plan section) Acute Rehab PT Goals Patient Stated Goal: Patient in agreement with SNF rehab PT Goal Formulation: With patient Time For Goal Achievement: 12/02/19 Potential to Achieve Goals: Good Progress  towards PT goals: Progressing toward goals    Frequency    Min 3X/week      PT Plan Current plan remains appropriate    Co-evaluation              AM-PAC PT "6 Clicks" Mobility   Outcome Measure  Help needed turning from your back to your side while in a flat bed without using bedrails?: A Lot Help needed moving from lying on your back to sitting on the side of a flat bed without using bedrails?: A Lot Help needed moving to and from a bed to a chair (including a wheelchair)?: Total Help needed standing up from a chair using your arms (e.g., wheelchair or bedside chair)?: Total Help needed to walk in hospital room?: Total Help needed climbing 3-5 steps with a railing? : Total 6 Click Score: 8    End of Session   Activity Tolerance: Patient tolerated treatment well Patient left: in bed;with call bell/phone within reach;with bed alarm set Nurse Communication: Mobility status (pt asking about dressing change - RN/MD notified) PT Visit Diagnosis: Muscle weakness (generalized) (M62.81);Difficulty in walking, not elsewhere classified (R26.2)     Time: 5974-1638 PT Time Calculation (min) (ACUTE ONLY): 18 min  Charges:  $Therapeutic Activity: 8-22 mins                     Semaj Coburn P., PTA Acute Rehabilitation Services Pager: [phone] Office: [place] 11/22/2019, 11:46 AM

## 2019-11-22 NOTE — Discharge Summary (Signed)
Discharge Diagnoses:  Active Problems:   Septic arthritis of knee ([name])   Septic joint of left knee joint ([name])   Infection of prosthetic left knee joint ([name])   Surgeries: Procedure(s): AMPUTATION ABOVE KNEE on 11/17/2019    Consultants: Treatment Team:  Newt Minion, MD Paralee Cancel, MD  Discharged Condition: Improved  Hospital Course: [name] is an 69 y.o. male who was admitted 11/06/2019 with a chief complaint of  Septic left Knee prosthesis, with a final diagnosis of Septic left total knee.  Patient was brought to the operating room on 11/17/2019 and underwent Procedure(s): AMPUTATION ABOVE KNEE.    Patient was given perioperative antibiotics:  Anti-infectives (From admission, onward)   Start     Dose/Rate Route Frequency Ordered Stop   11/17/19 1300  ceFAZolin (ANCEF) IVPB 2g/100 mL premix  Status:  Discontinued        2 g 200 mL/hr over 30 Minutes Intravenous Every 6 hours 11/17/19 1251 11/17/19 1259   11/17/19 0600  ceFAZolin (ANCEF) 3 g in dextrose 5 % 50 mL IVPB        3 g 100 mL/hr over 30 Minutes Intravenous On call to O.R. 11/16/19 1729 11/17/19 0905   11/09/19 0000  ampicillin IVPB        12 g Intravenous Every 24 hours 11/09/19 1218 12/21/19 2359   11/08/19 2200  ampicillin (OMNIPEN) 2 g in sodium chloride 0.9 % 100 mL IVPB        2 g 300 mL/hr over 20 Minutes Intravenous Every 4 hours 11/08/19 1813     11/08/19 1600  ampicillin (OMNIPEN) 2 g in sodium chloride 0.9 % 100 mL IVPB  Status:  Discontinued        2 g 300 mL/hr over 20 Minutes Intravenous Every 4 hours 11/08/19 1316 11/08/19 1813   11/07/19 1000  rifampin (RIFADIN) capsule 600 mg  Status:  Discontinued        600 mg Oral Daily 11/07/19 0843 11/07/19 1358   11/07/19 0900  cefTRIAXone (ROCEPHIN) 2 g in sodium chloride 0.9 % 100 mL IVPB  Status:  Discontinued        2 g 200 mL/hr over 30 Minutes Intravenous Every 24 hours 11/07/19 0843 11/07/19 1400   11/07/19 0400  vancomycin (VANCOREADY)  IVPB 1250 mg/250 mL  Status:  Discontinued        1,250 mg 166.7 mL/hr over 90 Minutes Intravenous Every 12 hours 11/06/19 2006 11/08/19 1316   11/06/19 1500  ceFAZolin (ANCEF) IVPB 2g/100 mL premix        2 g 200 mL/hr over 30 Minutes Intravenous Every 6 hours 11/06/19 1322 11/06/19 2047   11/06/19 1400  vancomycin (VANCOCIN) 2,500 mg in sodium chloride 0.9 % 500 mL IVPB        2,500 mg 250 mL/hr over 120 Minutes Intravenous  Once 11/06/19 1318 11/06/19 1816   11/06/19 1044  vancomycin (VANCOCIN) powder  Status:  Discontinued          As needed 11/06/19 1044 11/06/19 1309   11/06/19 0600  ceFAZolin (ANCEF) 3 g in dextrose 5 % 50 mL IVPB        3 g 100 mL/hr over 30 Minutes Intravenous On call to O.R. 11/05/19 0740 11/06/19 0904    .  Patient was given sequential compression devices, early ambulation, and aspirin for DVT prophylaxis.  Recent vital signs:  Patient Vitals for the past 24 hrs:  BP Temp Temp src Pulse Resp SpO2  11/22/19 0445 116/78 97.9 F (36.6 C) Oral 88 19 98 %  11/21/19 2001 115/77 98.1 F (36.7 C) Oral 85 17 100 %  11/21/19 1806 113/74 98.9 F (37.2 C) Oral -- 19 97 %  11/21/19 1005 107/71 -- -- 97 15 100 %  11/21/19 0732 99/71 98.5 F (36.9 C) Oral 89 15 97 %  .  Recent laboratory studies: No results found.  Discharge Medications:   Allergies as of 11/22/2019      Reactions   Morphine Hives      Medication List    STOP taking these medications   doxycycline 100 MG capsule Commonly known as: VIBRAMYCIN     TAKE these medications   ampicillin  IVPB Inject 12 g into the vein daily. As a continuous infusion. Indication:  Prosthetic joint infection  First Dose: Yes Last Day of Therapy:  12/20/19 Labs - Once weekly:  CBC/D and BMP, Labs - Every other week:  ESR and CRP Method of administration: Ambulatory Pump (Continuous Infusion) Method of administration may be changed at the discretion of home infusion pharmacist based upon assessment of the  patient and/or caregiver's ability to self-administer the medication ordered.   diltiazem 240 MG 24 hr capsule Commonly known as: CARDIZEM CD TAKE 1 CAPSULE BY MOUTH DAILY   ferrous sulfate 325 (65 FE) MG tablet Commonly known as: FerrouSul Take 1 tablet (325 mg total) by mouth 3 (three) times daily with meals for 14 days. What changed: when to take this   furosemide 20 MG tablet Commonly known as: LASIX Take 20 mg by mouth daily as needed (fluid retention.).   levothyroxine 125 MCG tablet Commonly known as: SYNTHROID Take 125 mcg by mouth daily before breakfast.   methocarbamol 500 MG tablet Commonly known as: Robaxin Take 1 tablet (500 mg total) by mouth every 6 (six) hours as needed for muscle spasms.   metoprolol tartrate 50 MG tablet Commonly known as: LOPRESSOR TAKE 1 TABLET BY MOUTH TWICE DAILY   multivitamin with minerals Tabs tablet Take 1 tablet by mouth daily.   naphazoline-glycerin 0.012-0.2 % Soln Commonly known as: CLEAR EYES REDNESS Place 1-2 drops into both eyes 4 (four) times daily as needed for eye irritation.   oxyCODONE 5 MG immediate release tablet Commonly known as: Oxy IR/ROXICODONE Take 1-2 tablets (5-10 mg total) by mouth every 4 (four) hours as needed for moderate pain (pain score 4-6).   Xarelto 20 MG Tabs tablet Generic drug: rivaroxaban TAKE 1 TABLET BY MOUTH DAILY WITH BREAKFAST            Discharge Care Instructions  (From admission, onward)         Start     Ordered   11/09/19 0000  Change dressing on IV access line weekly and PRN  (Home infusion instructions - [facility] )        11/09/19 1218          Diagnostic Studies: IR PICC PLACEMENT RIGHT >5 YRS INC IMG GUIDE  Result Date: 11/12/2019 INDICATION: 69 year old male with infected left prosthetic knee joint. He presents for PICC placement for durable intravenous access. EXAM: PICC LINE PLACEMENT WITH ULTRASOUND AND FLUOROSCOPIC GUIDANCE MEDICATIONS: None.  ANESTHESIA/SEDATION: None. FLUOROSCOPY TIME:  Fluoroscopy Time: 0 minutes 6 seconds (11 mGy). COMPLICATIONS: None immediate. PROCEDURE: The patient was advised of the possible risks and complications and agreed to undergo the procedure. The patient was then brought to the angiographic suite for the procedure. The right arm was prepped  with chlorhexidine, draped in the usual sterile fashion using maximum barrier technique (cap and mask, sterile gown, sterile gloves, large sterile sheet, hand hygiene and cutaneous antisepsis) and infiltrated locally with 1% Lidocaine. Ultrasound demonstrated patency of the right brachial vein, and this was documented with an image. Under real-time ultrasound guidance, this vein was accessed with a 21 gauge micropuncture needle and image documentation was performed. A 0.018 wire was introduced in to the vein. Over this, a 6 [place] dual lumen power-injectable PICC was advanced to the lower SVC/right atrial junction. Fluoroscopy during the procedure and fluoro spot radiograph confirms appropriate catheter position. The catheter was flushed and covered with a sterile dressing. Catheter length: 40 cm IMPRESSION: Successful right arm Power PICC line placement with ultrasound and fluoroscopic guidance. The catheter is ready for use. Electronically Signed   By: Jacqulynn Cadet M.D.   On: 11/12/2019 10:04   [place] EKG SITE RITE  Result Date: 11/06/2019 If Site Rite image not attached, placement could not be confirmed due to current cardiac rhythm.   Patient benefited maximally from their hospital stay and there were no complications.     Disposition: Discharge disposition: 03-[facility]illed [place]      Discharge Instructions    [facility] pharmacist to adjust dose for Vancomycin, Aminoglycosides and other anti-infective therapies as requested by physician.   Complete by: As directed    [facility] to provide Cath Flo 71m   Complete by: As directed     Administer for PICC line occlusion and as ordered by physician for other access device issues.   Anaphylaxis Kit: Provided to treat any anaphylactic reaction to the medication being provided to the patient if First Dose or when requested by physician   Complete by: As directed    Epinephrine 1675mml vial / amp: Administer 0.75m51m0.75ml62mubcutaneously once for moderate to severe anaphylaxis, nurse to call physician and pharmacy when reaction occurs and call 911 if needed for immediate care   Diphenhydramine 50mg12mIV vial: Administer 25-50mg 35mM PRN for first dose reaction, rash, itching, mild reaction, nurse to call physician and pharmacy when reaction occurs   Sodium Chloride 0.9% NS 500ml I94mdminister if needed for hypovolemic blood pressure drop or as ordered by physician after call to physician with anaphylactic reaction   Call MD / Call 911   Complete by: As directed    If you experience chest pain or shortness of breath, CALL 911 and be transported to the hospital emergency room.  If you develope a fever above 101 F, pus (white drainage) or increased drainage or redness at the wound, or calf pain, call your surgeon's office.   Call MD / Call 911   Complete by: As directed    If you experience chest pain or shortness of breath, CALL 911 and be transported to the hospital emergency room.  If you develope a fever above 101 F, pus (white drainage) or increased drainage or redness at the wound, or calf pain, call your surgeon's office.   Change dressing on IV access line weekly and PRN   Complete by: As directed    Constipation Prevention   Complete by: As directed    Drink plenty of fluids.  Prune juice may be helpful.  You may use a stool softener, such as Colace (over the counter) 100 mg twice a day.  Use MiraLax (over the counter) for constipation as needed.   Constipation Prevention   Complete by: As directed  Drink plenty of fluids.  Prune juice may be helpful.  You may use a stool  softener, such as Colace (over the counter) 100 mg twice a day.  Use MiraLax (over the counter) for constipation as needed.   Diet - low sodium heart healthy   Complete by: As directed    Diet - low sodium heart healthy   Complete by: As directed    Discharge instructions   Complete by: As directed    Please call office if prevena pump is beeping or stops working   Discharge instructions   Complete by: As directed    Keep dressing dry. Call office if wound vac beeps or stops functioning .   Flush IV access with Sodium Chloride 0.9% and Heparin 10 units/ml or 100 units/ml   Complete by: As directed    Home infusion instructions - [facility]   Complete by: As directed    Instructions: Flush IV access with Sodium Chloride 0.9% and Heparin 10units/ml or 100units/ml   Change dressing on IV access line: Weekly and PRN   Instructions Cath Flo 80m: Administer for PICC Line occlusion and as ordered by physician for other access device   [facility] pharmacist to adjust dose for: Vancomycin, Aminoglycosides and other anti-infective therapies as requested by physician   Increase activity slowly as tolerated   Complete by: As directed    Increase activity slowly as tolerated   Complete by: As directed    Method of administration may be changed at the discretion of home infusion pharmacist based upon assessment of the patient and/or caregiver's ability to self-administer the medication ordered   Complete by: As directed    Negative Pressure Wound Therapy - Incisional   Complete by: As directed    Show patient how to attach prevena pump      Follow-up Information    OParalee Cancel MD. Schedule an appointment as soon as possible for a visit in 2 weeks.   Specialty: Orthopedic Surgery Contact information: [phone]       [facility] Follow up.   Specialty: HSnyderWhy: to provide home  health nursing and physical therapy  Contact information: [place] [phone]       Ratasha Fabre, MBevely Palmer PUtahFollow up in 1 week(s).   Specialty: Orthopedic Surgery Contact information: [phone]               Signed: MBevely PalmerPersons 11/22/2019, 6:52 AM

## 2019-11-22 NOTE — Discharge Summary (Signed)
Discharge Diagnoses:  Active Problems:   Septic arthritis of knee ([place])   Septic joint of left knee joint ([place])   Infection of prosthetic left knee joint ([place])   Surgeries: Procedure(s): AMPUTATION ABOVE KNEE on 11/17/2019    Consultants: Treatment Team:  Newt Minion, MD Paralee Cancel, MD  Discharged Condition: Improved  Hospital Course: [name] is an 69 y.o. male who was admitted 11/06/2019 with a chief complaint of septic left knee, with a final diagnosis of Septic left total knee.  Patient was brought to the operating room on 11/17/2019 and underwent Procedure(s): AMPUTATION ABOVE KNEE.    Patient was given perioperative antibiotics:  Anti-infectives (From admission, onward)   Start     Dose/Rate Route Frequency Ordered Stop   11/17/19 1300  ceFAZolin (ANCEF) IVPB 2g/100 mL premix  Status:  Discontinued        2 g 200 mL/hr over 30 Minutes Intravenous Every 6 hours 11/17/19 1251 11/17/19 1259   11/17/19 0600  ceFAZolin (ANCEF) 3 g in dextrose 5 % 50 mL IVPB        3 g 100 mL/hr over 30 Minutes Intravenous On call to O.R. 11/16/19 1729 11/17/19 0905   11/09/19 0000  ampicillin IVPB        12 g Intravenous Every 24 hours 11/09/19 1218 12/21/19 2359   11/08/19 2200  ampicillin (OMNIPEN) 2 g in sodium chloride 0.9 % 100 mL IVPB        2 g 300 mL/hr over 20 Minutes Intravenous Every 4 hours 11/08/19 1813     11/08/19 1600  ampicillin (OMNIPEN) 2 g in sodium chloride 0.9 % 100 mL IVPB  Status:  Discontinued        2 g 300 mL/hr over 20 Minutes Intravenous Every 4 hours 11/08/19 1316 11/08/19 1813   11/07/19 1000  rifampin (RIFADIN) capsule 600 mg  Status:  Discontinued        600 mg Oral Daily 11/07/19 0843 11/07/19 1358   11/07/19 0900  cefTRIAXone (ROCEPHIN) 2 g in sodium chloride 0.9 % 100 mL IVPB  Status:  Discontinued        2 g 200 mL/hr over 30 Minutes Intravenous Every 24 hours 11/07/19 0843 11/07/19 1400   11/07/19 0400  vancomycin (VANCOREADY) IVPB 1250  mg/250 mL  Status:  Discontinued        1,250 mg 166.7 mL/hr over 90 Minutes Intravenous Every 12 hours 11/06/19 2006 11/08/19 1316   11/06/19 1500  ceFAZolin (ANCEF) IVPB 2g/100 mL premix        2 g 200 mL/hr over 30 Minutes Intravenous Every 6 hours 11/06/19 1322 11/06/19 2047   11/06/19 1400  vancomycin (VANCOCIN) 2,500 mg in sodium chloride 0.9 % 500 mL IVPB        2,500 mg 250 mL/hr over 120 Minutes Intravenous  Once 11/06/19 1318 11/06/19 1816   11/06/19 1044  vancomycin (VANCOCIN) powder  Status:  Discontinued          As needed 11/06/19 1044 11/06/19 1309   11/06/19 0600  ceFAZolin (ANCEF) 3 g in dextrose 5 % 50 mL IVPB        3 g 100 mL/hr over 30 Minutes Intravenous On call to O.R. 11/05/19 0740 11/06/19 0904    .  Patient was given sequential compression devices, early ambulation, and aspirin for DVT prophylaxis.  Recent vital signs:  Patient Vitals for the past 24 hrs:  BP Temp Temp src Pulse Resp SpO2  11/22/19 0821  113/75 98.2 F (36.8 C) Oral 72 17 98 %  11/22/19 0445 116/78 97.9 F (36.6 C) Oral 88 19 98 %  11/21/19 2001 115/77 98.1 F (36.7 C) Oral 85 17 100 %  11/21/19 1806 113/74 98.9 F (37.2 C) Oral -- 19 97 %  .  Recent laboratory studies: No results found.  Discharge Medications:   Allergies as of 11/22/2019      Reactions   Morphine Hives      Medication List    STOP taking these medications   doxycycline 100 MG capsule Commonly known as: VIBRAMYCIN     TAKE these medications   ampicillin  IVPB Inject 12 g into the vein daily. As a continuous infusion. Indication:  Prosthetic joint infection  First Dose: Yes Last Day of Therapy:  12/20/19 Labs - Once weekly:  CBC/D and BMP, Labs - Every other week:  ESR and CRP Method of administration: Ambulatory Pump (Continuous Infusion) Method of administration may be changed at the discretion of home infusion pharmacist based upon assessment of the patient and/or caregiver's ability to  self-administer the medication ordered.   diltiazem 240 MG 24 hr capsule Commonly known as: CARDIZEM CD TAKE 1 CAPSULE BY MOUTH DAILY   ferrous sulfate 325 (65 FE) MG tablet Commonly known as: FerrouSul Take 1 tablet (325 mg total) by mouth 3 (three) times daily with meals for 14 days. What changed: when to take this   furosemide 20 MG tablet Commonly known as: LASIX Take 20 mg by mouth daily as needed (fluid retention.).   levothyroxine 125 MCG tablet Commonly known as: SYNTHROID Take 125 mcg by mouth daily before breakfast.   methocarbamol 500 MG tablet Commonly known as: Robaxin Take 1 tablet (500 mg total) by mouth every 6 (six) hours as needed for muscle spasms.   metoprolol tartrate 50 MG tablet Commonly known as: LOPRESSOR TAKE 1 TABLET BY MOUTH TWICE DAILY   multivitamin with minerals Tabs tablet Take 1 tablet by mouth daily.   naphazoline-glycerin 0.012-0.2 % Soln Commonly known as: CLEAR EYES REDNESS Place 1-2 drops into both eyes 4 (four) times daily as needed for eye irritation.   oxyCODONE 5 MG immediate release tablet Commonly known as: Oxy IR/ROXICODONE Take 1-2 tablets (5-10 mg total) by mouth every 4 (four) hours as needed for moderate pain (pain score 4-6).   Xarelto 20 MG Tabs tablet Generic drug: rivaroxaban TAKE 1 TABLET BY MOUTH DAILY WITH BREAKFAST            Discharge Care Instructions  (From admission, onward)         Start     Ordered   11/09/19 0000  Change dressing on IV access line weekly and PRN  (Home infusion instructions - [facility] )        11/09/19 1218          Diagnostic Studies: IR PICC PLACEMENT RIGHT >5 YRS INC IMG GUIDE  Result Date: 11/12/2019 INDICATION: 69 year old male with infected left prosthetic knee joint. He presents for PICC placement for durable intravenous access. EXAM: PICC LINE PLACEMENT WITH ULTRASOUND AND FLUOROSCOPIC GUIDANCE MEDICATIONS: None. ANESTHESIA/SEDATION: None. FLUOROSCOPY  TIME:  Fluoroscopy Time: 0 minutes 6 seconds (11 mGy). COMPLICATIONS: None immediate. PROCEDURE: The patient was advised of the possible risks and complications and agreed to undergo the procedure. The patient was then brought to the angiographic suite for the procedure. The right arm was prepped with chlorhexidine, draped in the usual sterile fashion using maximum barrier technique (  cap and mask, sterile gown, sterile gloves, large sterile sheet, hand hygiene and cutaneous antisepsis) and infiltrated locally with 1% Lidocaine. Ultrasound demonstrated patency of the right brachial vein, and this was documented with an image. Under real-time ultrasound guidance, this vein was accessed with a 21 gauge micropuncture needle and image documentation was performed. A 0.018 wire was introduced in to the vein. Over this, a 6 [place] dual lumen power-injectable PICC was advanced to the lower SVC/right atrial junction. Fluoroscopy during the procedure and fluoro spot radiograph confirms appropriate catheter position. The catheter was flushed and covered with a sterile dressing. Catheter length: 40 cm IMPRESSION: Successful right arm Power PICC line placement with ultrasound and fluoroscopic guidance. The catheter is ready for use. Electronically Signed   By: Jacqulynn Cadet M.D.   On: 11/12/2019 10:04   [place] EKG SITE RITE  Result Date: 11/06/2019 If Site Rite image not attached, placement could not be confirmed due to current cardiac rhythm.   Patient benefited maximally from their hospital stay and there were no complications.     Disposition: Discharge disposition: 03-Skilled [place]      Discharge Instructions    [facility] pharmacist to adjust dose for Vancomycin, Aminoglycosides and other anti-infective therapies as requested by physician.   Complete by: As directed    [facility] to provide Cath Flo 14m   Complete by: As directed    Administer for PICC line occlusion  and as ordered by physician for other access device issues.   Anaphylaxis Kit: Provided to treat any anaphylactic reaction to the medication being provided to the patient if First Dose or when requested by physician   Complete by: As directed    Epinephrine 159mml vial / amp: Administer 0.60m78m0.60ml560mubcutaneously once for moderate to severe anaphylaxis, nurse to call physician and pharmacy when reaction occurs and call 911 if needed for immediate care   Diphenhydramine 50mg54mIV vial: Administer 25-50mg 75mM PRN for first dose reaction, rash, itching, mild reaction, nurse to call physician and pharmacy when reaction occurs   Sodium Chloride 0.9% NS 500ml I41mdminister if needed for hypovolemic blood pressure drop or as ordered by physician after call to physician with anaphylactic reaction   Call MD / Call 911   Complete by: As directed    If you experience chest pain or shortness of breath, CALL 911 and be transported to the hospital emergency room.  If you develope a fever above 101 F, pus (white drainage) or increased drainage or redness at the wound, or calf pain, call your surgeon's office.   Call MD / Call 911   Complete by: As directed    If you experience chest pain or shortness of breath, CALL 911 and be transported to the hospital emergency room.  If you develope a fever above 101 F, pus (white drainage) or increased drainage or redness at the wound, or calf pain, call your surgeon's office.   Change dressing on IV access line weekly and PRN   Complete by: As directed    Constipation Prevention   Complete by: As directed    Drink plenty of fluids.  Prune juice may be helpful.  You may use a stool softener, such as Colace (over the counter) 100 mg twice a day.  Use MiraLax (over the counter) for constipation as needed.   Constipation Prevention   Complete by: As directed    Drink plenty of fluids.  Prune juice may be helpful.  You  may use a stool softener, such as Colace (over the  counter) 100 mg twice a day.  Use MiraLax (over the counter) for constipation as needed.   Diet - low sodium heart healthy   Complete by: As directed    Diet - low sodium heart healthy   Complete by: As directed    Discharge instructions   Complete by: As directed    Please call office if prevena pump is beeping or stops working   Discharge instructions   Complete by: As directed    Keep dressing dry. Call office if wound vac beeps or stops functioning .   Flush IV access with Sodium Chloride 0.9% and Heparin 10 units/ml or 100 units/ml   Complete by: As directed    Home infusion instructions - [facility]   Complete by: As directed    Instructions: Flush IV access with Sodium Chloride 0.9% and Heparin 10units/ml or 100units/ml   Change dressing on IV access line: Weekly and PRN   Instructions Cath Flo 97m: Administer for PICC Line occlusion and as ordered by physician for other access device   [facility] pharmacist to adjust dose for: Vancomycin, Aminoglycosides and other anti-infective therapies as requested by physician   Increase activity slowly as tolerated   Complete by: As directed    Increase activity slowly as tolerated   Complete by: As directed    Method of administration may be changed at the discretion of home infusion pharmacist based upon assessment of the patient and/or caregiver's ability to self-administer the medication ordered   Complete by: As directed       Contact information for follow-up providers    OParalee Cancel MD. Schedule an appointment as soon as possible for a visit in 2 weeks.   Specialty: Orthopedic Surgery Contact information: [phone]       [facility] Follow up.   Specialty: [place] to provide home health nursing and physical therapy  Contact information: [phone] [phone]       Kammi Hechler,  MBevely Palmer PUtahFollow up in 1 week(s).   Specialty: Orthopedic Surgery Contact information: [place] [phone]           Contact information for after-discharge care    [facility]   Service: Skilled Nursing Contact information: [phone]                  Signed: MBevely PalmerPersons 11/22/2019, 11:43 AM

## 2019-11-22 NOTE — TOC Transition Note (Signed)
Transition of Care [facility]) - CM/SW Discharge Note   Patient Details  Name: [name] MRN: [id number] Date of Birth: 1950-04-03  Transition of Care [facility]) CM/SW Contact:  Sharin Mons, RN Phone Number: 11/22/2019, 11:29 AM   Clinical Narrative:    Patient will DC to: SNF, [place] date: 11/22/2019 Family notified: yes Transport by: Corey Harold   Per MD patient ready for DC today . RN, patient, patient's family, and facility notified of DC. Discharge Summary and FL2 sent to facility. RN to call report prior to discharge [phone]). DC packet on chart. Ambulance transport requested for patient, timed pick up for 2pm.  RNCM will sign off for now as intervention is no longer needed. Please consult [place] again if new needs arise.    Final next level of care: Skilled Nursing Facility Barriers to Discharge: No Barriers Identified   Patient Goals and CMS Choice Patient states their goals for this hospitalization and ongoing recovery are:: return home CMS Medicare.gov Compare Post Acute Care list provided to:: Patient Choice offered to / list presented to : Patient  Discharge Placement                       Discharge Plan and Services In-house Referral: Clinical Social Work   Post Acute Care Choice: Durable Medical Equipment                    HH Arranged: RN, PT, IV Antibiotics HH Agency: [facility], Other - See comment (and [facility]) Date HH Agency Contacted: 11/08/19 Time HH Agency Contacted: 1200 Representative spoke with at [place]: Carolynn Sayers with [facility];  Amy Hyatt with [facility]  Social Determinants of Health (SDOH) Interventions     Readmission Risk Interventions Readmission Risk Prevention Plan 11/09/2019  Transportation Screening Complete  PCP or Specialist Appt within 5-7 Days Complete  Home Care Screening Complete  Medication Review (RN CM) Complete  Some recent data might be hidden

## 2019-11-22 NOTE — Progress Notes (Signed)
OT Cancellation Note  Patient Details Name: [name] MRN: [id number] DOB: Nov 14, 1950   Cancelled Treatment:    Reason Eval/Treat Not Completed: Other (comment) (Patient preparing for d/c to SNF.)   Simranjit Thayer H. OTR/L Supplemental OT, Department of rehab services [phone]  Adith Tejada R H. 11/22/2019, 1:44 PM

## 2019-11-22 NOTE — Progress Notes (Signed)
Discharge summary packet/pertinent documents provided to [facility] staff. Pt D/C to [facility] as ordered.pt remains alert/oriented in no apparent distress. No complaints.

## 2019-11-26 ENCOUNTER — Encounter: Payer: Self-pay | Admitting: Physician Assistant

## 2019-11-26 ENCOUNTER — Telehealth: Payer: Self-pay | Admitting: Orthopedic Surgery

## 2019-11-26 ENCOUNTER — Ambulatory Visit (INDEPENDENT_AMBULATORY_CARE_PROVIDER_SITE_OTHER): Payer: Medicare Other | Admitting: Physician Assistant

## 2019-11-26 VITALS — Ht 68.0 in | Wt 388.0 lb

## 2019-11-26 DIAGNOSIS — T8459XD Infection and inflammatory reaction due to other internal joint prosthesis, subsequent encounter: Secondary | ICD-10-CM

## 2019-11-26 DIAGNOSIS — Z96659 Presence of unspecified artificial knee joint: Secondary | ICD-10-CM

## 2019-11-26 NOTE — Telephone Encounter (Signed)
Kathlee Nations with [facility] called stating the pt has some draining at his incision site and the pt often itches near the wound so Kathlee Nations is worried about infection. Kathlee Nations would like a CB with any orders for the wound to prevent infection  Kathlee Nations CB# [phone]

## 2019-11-26 NOTE — Telephone Encounter (Signed)
Patient needs to be seen if increased drainage

## 2019-11-26 NOTE — Telephone Encounter (Signed)
Please advise 

## 2019-11-26 NOTE — Telephone Encounter (Signed)
I spoke with Kathlee Nations. She is going to have patient transported this afternoon for wound to be looked at. She is concerned that he is having some dehiscence.  I put on Mary Anne's schedule at 3, however, they have to call transport and will get him here as soon as they can.

## 2019-11-26 NOTE — Progress Notes (Signed)
Office Visit Note   Patient: [name]           Date of Birth: 1950-02-18           MRN: [id number] Visit Date: 11/26/2019              Requested by: Bernerd Limbo, MD [place],  [place] [address] PCP: Bernerd Limbo, MD  Chief Complaint  Patient presents with  . Left Leg - Routine Post Op    11/17/19 left AKA       HPI: This is a pleasant gentleman who 9 days status post left above-knee amputation.  He comes in today because his nursing facility was concerned about bloody drainage from the central portion of the wound  Assessment & Plan: Visit Diagnoses: No diagnosis found.  Plan: Should continue to do daily cleansing and dressing changes may have to do more than once a day as needed.  Patient will follow up in 1 week.  Because of his chronic anticoagulation not unexpected that he should have some bloody drainage  Follow-Up Instructions: No follow-ups on file.   Ortho Exam  Patient is alert, oriented, no adenopathy, well-dressed, normal affect, normal respiratory effort. On examination wound is well approximated there is no necrosis no foul odor some minimal bloody drainage to the central portion of the wound which appears to be old blood.  No cellulitis or signs of infection  Imaging: No results found. No images are attached to the encounter.  Labs: Lab Results  Component Value Date   HGBA1C 5.5 11/02/2019   HGBA1C 5.3 08/26/2012   HGBA1C  05/16/2008    5.6 (NOTE) The ADA recommends the following therapeutic goal for glycemic control related to Hgb A1c measurement: Goal of therapy: <6.5 Hgb A1c  Reference: American Diabetes Association: Clinical Practice Recommendations 2010, Diabetes Care, 2010, 33: (Suppl  1).   ESRSEDRATE 64 (H) 02/12/2019   ESRSEDRATE 53 (H) 02/05/2019   ESRSEDRATE 75 (H) 01/29/2019   CRP 4.1 (H) 02/12/2019   CRP 3.9 (H) 02/05/2019   CRP 3.6 (H) 01/29/2019   REPTSTATUS 11/09/2019 FINAL 11/06/2019    REPTSTATUS 11/12/2019 FINAL 11/06/2019   GRAMSTAIN  11/06/2019    RARE WBC PRESENT,BOTH PMN AND MONONUCLEAR RARE GRAM POSITIVE COCCI IN PAIRS Performed at [facility], [place] [address] [place], [place] [address]    GRAMSTAIN  11/06/2019    ABUNDANT WBC PRESENT,BOTH PMN AND MONONUCLEAR FEW GRAM POSITIVE COCCI    CULT FEW ENTEROCOCCUS FAECALIS 11/06/2019   CULT  11/06/2019    NO ANAEROBES ISOLATED Performed at [facility], [place] [address] [place], [place] [address]    LABORGA ENTEROCOCCUS FAECALIS 11/06/2019     Lab Results  Component Value Date   ALBUMIN 3.4 (L) 05/14/2019   ALBUMIN 3.8 01/25/2013   ALBUMIN 3.4 (L) 08/30/2012    Lab Results  Component Value Date   MG 2.1 09/02/2012   MG 2.2 08/30/2012   MG 2.1 08/29/2012   No results found for: VD25OH  No results found for: PREALBUMIN CBC EXTENDED Latest Ref Rng & Units 11/18/2019 11/17/2019 11/08/2019  WBC 4.0 - 10.5 K/uL - - 7.6  RBC 4.22 - 5.81 MIL/uL - - 2.91(L)  HGB 13.0 - 17.0 g/dL 8.4(L) 8.5(L) 8.7(L)  HCT 39 - 52 % 27.9(L) 25.0(L) 29.4(L)  PLT 150 - 400 K/uL - - 344  NEUTROABS 1.7 - 7.7 K/uL - - -  LYMPHSABS 0.7 - 4.0 K/uL - - -  Body mass index is 59 kg/m.  Orders:  No orders of the defined types were placed in this encounter.  No orders of the defined types were placed in this encounter.    Procedures: No procedures performed  Clinical Data: No additional findings.  ROS:  All other systems negative, except as noted in the HPI. Review of Systems  Objective: Vital Signs: Ht 5\' 8"  (1.727 m)   Wt (!) 388 lb (176 kg)   BMI 59.00 kg/m   Specialty Comments:  No specialty comments available.  PMFS History: Patient Active Problem List   Diagnosis Date Noted  . Infection of prosthetic left knee joint ([place]) 11/08/2019  . Septic joint of left knee joint ([place]) 11/06/2019  . 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[place]) 08/15/2019  . Open knee wound 08/14/2019  . S/P left TK  revision 04/03/2019  . Administration of long-term prophylactic antibiotics   . History of streptococcal infection   . History of DVT (deep vein thrombosis)   . S/P rev left TK 12/15/2015  . 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 Preoperative cardiovascular examination 04/17/2013  . OSA on CPAP 11/27/2012  . Chronic diastolic heart failure ([place]) 09/10/2012  . Chronic anticoagulation, with Xarelto 09/10/2012  . DVT (deep venous thrombosis), possible 09/10/2012  . SOB (shortness of breath) 08/26/2012  . Chest discomfort 08/26/2012  . Persistent atrial fibrillation ([place]) 08/26/2012  . Super obesity 06/27/2012  . Expected blood loss anemia 06/27/2012  . S/P left TK revision 06/23/2012  . Septic arthritis of knee ([place])   . Cellulitis 09/19/2010  . METHICILLIN RESISTANT STAPHYLOCOCCUS AUREUS INFECTION 09/10/2009  . 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 DM 07/04/2008  . CHRONIC KIDNEY DISEASE UNSPECIFIED 07/04/2008  . PULMONARY EMBOLISM, HX OF 07/04/2008  . INFECTION DUE TO INTERNAL ORTH DEVICE NEC 03/02/2006   Past Medical History:  Diagnosis Date  . CHF (congestive heart failure) ([place]) 05/2019  . Chronic anticoagulation    with Xarelto  . Complication of anesthesia    PT STATES HARD TO WAKE UP AFTER ONE SUGERY -STATES THE SURGERY TOOK LONGER THAN EXPECTED.  NO PROBLEMS WITH ANY OTHER SURGERY  . Complication of anesthesia    in 12/2018-states started  shaking after surgery, could not get warm   . Dysrhythmia    A-fib  . GERD (gastroesophageal reflux disease)   . History of blood clots   . History of blood transfusion   . Hypothyroidism   . Pain    BACK PAIN - PT ATTRIBUTES TO THE WAY HE WALKS DUE TO LEFT KNEE PROBLEM  . Persistent atrial fibrillation ([place])   . Septic arthritis of knee ([facility])    LEFT KNEE  . Shortness of breath    WITH EXERTION AND PAIN  . Sleep apnea    uses CPAP,    Family History  Problem Relation Age of Onset  . Heart disease  Father 81  . Pancreatic cancer Sister   . Liver cancer Sister   . Cervical cancer Sister   . Breast cancer Sister   . Diabetes Sister   . Colon cancer Neg Hx   . Throat cancer Neg Hx   . Stomach cancer Neg Hx   . Kidney disease Neg Hx   . Liver disease Neg Hx     Past Surgical History:  Procedure Laterality Date  . 2010 REMOVAL OF LEFT TOTAL KNEE    . AMPUTATION Left 11/17/2019   Procedure: AMPUTATION ABOVE KNEE;  Surgeon: Newt Minion, MD;  Location: [facility];  Service: Orthopedics;  Laterality: Left;  . CARDIAC CATHETERIZATION  2006  . CARDIOVERSION N/A 08/28/2012   Procedure: CARDIOVERSION;  Surgeon: Sanda Klein, MD;  Location: [facility];  Service: Cardiovascular;  Laterality: N/A;  . CARDIOVERSION N/A 09/01/2012   Procedure: CARDIOVERSION;  Surgeon: Pixie Casino, MD;  Location: [facility];  Service: Cardiovascular;  Laterality: N/A;  . COLONOSCOPY N/A 06/13/2013   Procedure: COLONOSCOPY;  Surgeon: Inda Castle, MD;  [facility];  Service: Endoscopy;  Laterality: N/A;  . EXCISIONAL TOTAL KNEE ARTHROPLASTY WITH ANTIBIOTIC SPACERS Left 09/21/2018   Procedure: Resection of tibia versus both components with placement of antibiotic spacer;  Surgeon: Paralee Cancel, MD;  [facility];  Service: Orthopedics;  Laterality: Left;  2.5 hrs  . EXCISIONAL TOTAL KNEE ARTHROPLASTY WITH ANTIBIOTIC SPACERS Left 01/02/2019   Procedure: Repeat Washout and placement of antibiotic spacer left knee;  Surgeon: Paralee Cancel, MD;  [facility];  Service: Orthopedics;  Laterality: Left;  2 hrs  . HYDROCELECTOY   2012  . I & D EXTREMITY Left 08/14/2019   Procedure: IRRIGATION AND DEBRIDEMENT LEFT KNEE WOUND;  Surgeon: Paralee Cancel, MD;  [facility];  Service: Orthopedics;  Laterality: Left;  60 mins  . I & D KNEE WITH POLY EXCHANGE Left 05/17/2019   Procedure: IRRIGATION AND DEBRIDEMENT KNEE WITH POLY EXCHANGE;  Surgeon: Paralee Cancel, MD;  [facility];  Service: Orthopedics;  Laterality:  Left;  90 mins  . IRRIGATION AND DEBRIDEMENT KNEE Left 04/03/2019   Procedure: REIMPLANTATION OF LEFT KNEE TIBIAL COMPONENT.;  Surgeon: Paralee Cancel, MD;  [facility];  Service: Orthopedics;  Laterality: Left;  need 120 min  . IRRIGATION AND DEBRIDEMENT KNEE Left 11/06/2019   Procedure: IRRIGATION AND DEBRIDEMENT KNEE;  Surgeon: Paralee Cancel, MD;  [facility];  Service: Orthopedics;  Laterality: Left;  90 mins  . LEFT KNEE ARTHROSCOPY  1999  . LEFT KNEE SURGERY UPPER TIBIAL OSTOMY    . LEFT TOTAL KNEE REMOVAL FOR INFECTION  2006  . REIMPLANTATION LEFT TOTAL KNEE  2008  . REIMPLANTATION LEFT TOTAL KNEE   2006  . REIMPLANTATION OF TOTAL KNEE Left 06/23/2012   Procedure: REIMPLANTATION OF LEFT TOTAL KNEE;  Surgeon: Mauri Pole, MD;  [facility];  Service: Orthopedics;  Laterality: Left;  . REMOVAL LEFT TOTAL KNEE   2008  . REPLACEMENT LEFT KNEE  2002  . REPLACEMENT RIGHT KNEE  2003  . REVISION LEFT KNEE CAP   2004  . RIGHT KNEE ARTHROSCOPY  1998  . TEE WITHOUT CARDIOVERSION N/A 08/28/2012   Procedure: TRANSESOPHAGEAL ECHOCARDIOGRAM (TEE);  Surgeon: Sanda Klein, MD;  Location: [facility];  Service: Cardiovascular;  Laterality: N/A;  . TONSILLECTOMY    . TOTAL KNEE REVISION Left 12/15/2015   Procedure: TOTAL KNEE REVISION REPLACEMENT;  Surgeon: Paralee Cancel, MD;  [facility];  Service: Orthopedics;  Laterality: Left;  Adductor Block   Social History   Occupational History  . Not on file  Tobacco Use  . Smoking status: Never Smoker  . Smokeless tobacco: Never Used  Vaping Use  . Vaping Use: Never used  Substance and Sexual Activity  . Alcohol use: Not Currently    Comment: not since 1999  . Drug use: No  . Sexual activity: Yes    Partners: Female

## 2019-12-10 ENCOUNTER — Ambulatory Visit: Payer: Medicare Other | Admitting: Physician Assistant

## 2019-12-17 ENCOUNTER — Encounter: Payer: Self-pay | Admitting: Physician Assistant

## 2019-12-17 ENCOUNTER — Ambulatory Visit (INDEPENDENT_AMBULATORY_CARE_PROVIDER_SITE_OTHER): Payer: Medicare Other | Admitting: Physician Assistant

## 2019-12-17 VITALS — Ht 68.0 in | Wt 388.0 lb

## 2019-12-17 DIAGNOSIS — Z89529 Acquired absence of unspecified knee: Secondary | ICD-10-CM

## 2019-12-17 NOTE — Progress Notes (Signed)
Office Visit Note   Patient: [name]           Date of Birth: 04/20/1950           MRN: [id number] Visit Date: 12/17/2019              Requested by: Bernerd Limbo, MD [place],  [place] [id number] PCP: Bernerd Limbo, MD  Chief Complaint  Patient presents with  . Left Leg - Routine Post Op    11/17/19 left AKA       HPI: Patient is a pleasant 69 year old gentleman who is 1 month status post left above-knee amputation.  He has been residing in a nursing facility.  He is here to have his stitches and staples removed.  Assessment & Plan: Visit Diagnoses: No diagnosis found.  Plan: A prescription to work with Cala Bradford was provided to him today.  He will follow-up for reevaluation in 2 weeks  Follow-Up Instructions: No follow-ups on file.   Ortho Exam  Patient is alert, oriented, no adenopathy, well-dressed, normal affect, normal respiratory effort. Left above-knee amputation wound edges has healed he does have a moderate soft tissue swelling but no cellulitis.  Stitches and staples were removed without difficulty today.  No foul odor no drainage except for bloody drainage where the stitches were removed and the stitch line Patient is a new left transfemoral amputee.  Patient's current comorbidities are not expected to impact the ability to function with the prescribed prosthesis. Patient verbally communicates a strong desire to use a prosthesis. Patient currently requires mobility aids to ambulate without a prosthesis.  Expects not to use mobility aids with a new prosthesis.  Patient is a K2 level ambulator that will use a prosthesis to walk around their home and the community over low level environmental barriers.     Imaging: No results found. No images are attached to the encounter.  Labs: Lab Results  Component Value Date   HGBA1C 5.5 11/02/2019   HGBA1C 5.3 08/26/2012   HGBA1C  05/16/2008    5.6 (NOTE) The ADA recommends the  following therapeutic goal for glycemic control related to Hgb A1c measurement: Goal of therapy: <6.5 Hgb A1c  Reference: American Diabetes Association: Clinical Practice Recommendations 2010, Diabetes Care, 2010, 33: (Suppl  1).   ESRSEDRATE 64 (H) 02/12/2019   ESRSEDRATE 53 (H) 02/05/2019   ESRSEDRATE 75 (H) 01/29/2019   CRP 4.1 (H) 02/12/2019   CRP 3.9 (H) 02/05/2019   CRP 3.6 (H) 01/29/2019   REPTSTATUS 11/09/2019 FINAL 11/06/2019   REPTSTATUS 11/12/2019 FINAL 11/06/2019   GRAMSTAIN  11/06/2019    RARE WBC PRESENT,BOTH PMN AND MONONUCLEAR RARE GRAM POSITIVE COCCI IN PAIRS Performed at [facility], [place] [address], [place], [place] [address]    GRAMSTAIN  11/06/2019    ABUNDANT WBC PRESENT,BOTH PMN AND MONONUCLEAR FEW GRAM POSITIVE COCCI    CULT FEW ENTEROCOCCUS FAECALIS 11/06/2019   CULT  11/06/2019    NO ANAEROBES ISOLATED Performed at [facility], [place] [address], [place], [place] [address]    LABORGA ENTEROCOCCUS FAECALIS 11/06/2019     Lab Results  Component Value Date   ALBUMIN 3.4 (L) 05/14/2019   ALBUMIN 3.8 01/25/2013   ALBUMIN 3.4 (L) 08/30/2012    Lab Results  Component Value Date   MG 2.1 09/02/2012   MG 2.2 08/30/2012   MG 2.1 08/29/2012   No results found for: VD25OH  No results found for: PREALBUMIN CBC  EXTENDED Latest Ref Rng & Units 11/18/2019 11/17/2019 11/08/2019  WBC 4.0 - 10.5 K/uL - - 7.6  RBC 4.22 - 5.81 MIL/uL - - 2.91(L)  HGB 13.0 - 17.0 g/dL 8.4(L) 8.5(L) 8.7(L)  HCT 39 - 52 % 27.9(L) 25.0(L) 29.4(L)  PLT 150 - 400 K/uL - - 344  NEUTROABS 1.7 - 7.7 K/uL - - -  LYMPHSABS 0.7 - 4.0 K/uL - - -     Body mass index is 59 kg/m.  Orders:  No orders of the defined types were placed in this encounter.  No orders of the defined types were placed in this encounter.    Procedures: No procedures performed  Clinical Data: No additional findings.  ROS:  All other systems negative, except as noted in the HPI. Review of  Systems  Objective: Vital Signs: Ht 5\' 8"  (1.727 m)   Wt (!) 388 lb (176 kg)   BMI 59.00 kg/m   Specialty Comments:  No specialty comments available.  PMFS History: Patient Active Problem List   Diagnosis Date Noted  . Infection of prosthetic left knee joint ([place]) 11/08/2019  . Septic joint of left knee joint ([place]) 11/06/2019  . 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[place]) 08/15/2019  . Open knee wound 08/14/2019  . S/P left TK revision 04/03/2019  . Administration of long-term prophylactic antibiotics   . History of streptococcal infection   . History of DVT (deep vein thrombosis)   . S/P rev left TK 12/15/2015  . 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 Preoperative cardiovascular examination 04/17/2013  . OSA on CPAP 11/27/2012  . Chronic diastolic heart failure ([place]) 09/10/2012  . Chronic anticoagulation, with Xarelto 09/10/2012  . DVT (deep venous thrombosis), possible 09/10/2012  . SOB (shortness of breath) 08/26/2012  . Chest discomfort 08/26/2012  . Persistent atrial fibrillation ([place]) 08/26/2012  . Super obesity 06/27/2012  . Expected blood loss anemia 06/27/2012  . S/P left TK revision 06/23/2012  . Septic arthritis of knee ([place])   . Cellulitis 09/19/2010  . METHICILLIN RESISTANT STAPHYLOCOCCUS AUREUS INFECTION 09/10/2009  . 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 DM 07/04/2008  . CHRONIC KIDNEY DISEASE UNSPECIFIED 07/04/2008  . PULMONARY EMBOLISM, HX OF 07/04/2008  . INFECTION DUE TO INTERNAL ORTH DEVICE NEC 03/02/2006   Past Medical History:  Diagnosis Date  . CHF (congestive heart failure) ([place]) 05/2019  . Chronic anticoagulation    with Xarelto  . Complication of anesthesia    PT STATES HARD TO WAKE UP AFTER ONE SUGERY -STATES THE SURGERY TOOK LONGER THAN EXPECTED.  NO PROBLEMS WITH ANY OTHER SURGERY  . Complication of anesthesia    in 12/2018-states started  shaking after surgery, could not get warm   . Dysrhythmia    A-fib  . GERD  (gastroesophageal reflux disease)   . History of blood clots   . History of blood transfusion   . Hypothyroidism   . Pain    BACK PAIN - PT ATTRIBUTES TO THE WAY HE WALKS DUE TO LEFT KNEE PROBLEM  . Persistent atrial fibrillation ([place])   . Septic arthritis of knee ([facility])    LEFT KNEE  . Shortness of breath    WITH EXERTION AND PAIN  . Sleep apnea    uses CPAP,    Family History  Problem Relation Age of Onset  . Heart disease Father 2  . Pancreatic cancer Sister   . Liver cancer Sister   . Cervical cancer Sister   .  Breast cancer Sister   . Diabetes Sister   . Colon cancer Neg Hx   . Throat cancer Neg Hx   . Stomach cancer Neg Hx   . Kidney disease Neg Hx   . Liver disease Neg Hx     Past Surgical History:  Procedure Laterality Date  . 2010 REMOVAL OF LEFT TOTAL KNEE    . AMPUTATION Left 11/17/2019   Procedure: AMPUTATION ABOVE KNEE;  Surgeon: Newt Minion, MD;  Location: [facility];  Service: Orthopedics;  Laterality: Left;  . CARDIAC CATHETERIZATION  2006  . CARDIOVERSION N/A 08/28/2012   Procedure: CARDIOVERSION;  Surgeon: Sanda Klein, MD;  Location: [facility];  Service: Cardiovascular;  Laterality: N/A;  . CARDIOVERSION N/A 09/01/2012   Procedure: CARDIOVERSION;  Surgeon: Pixie Casino, MD;  Location: [facility];  Service: Cardiovascular;  Laterality: N/A;  . COLONOSCOPY N/A 06/13/2013   Procedure: COLONOSCOPY;  Surgeon: Inda Castle, MD;  [facility];  Service: Endoscopy;  Laterality: N/A;  . EXCISIONAL TOTAL KNEE ARTHROPLASTY WITH ANTIBIOTIC SPACERS Left 09/21/2018   Procedure: Resection of tibia versus both components with placement of antibiotic spacer;  Surgeon: Paralee Cancel, MD;  [facility];  Service: Orthopedics;  Laterality: Left;  2.5 hrs  . EXCISIONAL TOTAL KNEE ARTHROPLASTY WITH ANTIBIOTIC SPACERS Left 01/02/2019   Procedure: Repeat Washout and placement of antibiotic spacer left knee;  Surgeon: Paralee Cancel, MD;  [facility];  Service:  Orthopedics;  Laterality: Left;  2 hrs  . HYDROCELECTOY   2012  . I & D EXTREMITY Left 08/14/2019   Procedure: IRRIGATION AND DEBRIDEMENT LEFT KNEE WOUND;  Surgeon: Paralee Cancel, MD;  [facility];  Service: Orthopedics;  Laterality: Left;  60 mins  . I & D KNEE WITH POLY EXCHANGE Left 05/17/2019   Procedure: IRRIGATION AND DEBRIDEMENT KNEE WITH POLY EXCHANGE;  Surgeon: Paralee Cancel, MD;  [facility];  Service: Orthopedics;  Laterality: Left;  90 mins  . IRRIGATION AND DEBRIDEMENT KNEE Left 04/03/2019   Procedure: REIMPLANTATION OF LEFT KNEE TIBIAL COMPONENT.;  Surgeon: Paralee Cancel, MD;  [facility];  Service: Orthopedics;  Laterality: Left;  need 120 min  . IRRIGATION AND DEBRIDEMENT KNEE Left 11/06/2019   Procedure: IRRIGATION AND DEBRIDEMENT KNEE;  Surgeon: Paralee Cancel, MD;  [facility];  Service: Orthopedics;  Laterality: Left;  90 mins  . LEFT KNEE ARTHROSCOPY  1999  . LEFT KNEE SURGERY UPPER TIBIAL OSTOMY    . LEFT TOTAL KNEE REMOVAL FOR INFECTION  2006  . REIMPLANTATION LEFT TOTAL KNEE  2008  . REIMPLANTATION LEFT TOTAL KNEE   2006  . REIMPLANTATION OF TOTAL KNEE Left 06/23/2012   Procedure: REIMPLANTATION OF LEFT TOTAL KNEE;  Surgeon: Mauri Pole, MD;  [facility];  Service: Orthopedics;  Laterality: Left;  . REMOVAL LEFT TOTAL KNEE   2008  . REPLACEMENT LEFT KNEE  2002  . REPLACEMENT RIGHT KNEE  2003  . REVISION LEFT KNEE CAP   2004  . RIGHT KNEE ARTHROSCOPY  1998  . TEE WITHOUT CARDIOVERSION N/A 08/28/2012   Procedure: TRANSESOPHAGEAL ECHOCARDIOGRAM (TEE);  Surgeon: Sanda Klein, MD;  Location: [facility];  Service: Cardiovascular;  Laterality: N/A;  . TONSILLECTOMY    . TOTAL KNEE REVISION Left 12/15/2015   Procedure: TOTAL KNEE REVISION REPLACEMENT;  Surgeon: Paralee Cancel, MD;  [facility];  Service: Orthopedics;  Laterality: Left;  Adductor Block   Social History   Occupational History  . Not on file  Tobacco  Use  . Smoking status: Never Smoker    . Smokeless tobacco: Never Used  Vaping Use  . Vaping Use: Never used  Substance and Sexual Activity  . Alcohol use: Not Currently    Comment: not since 1999  . Drug use: No  . Sexual activity: Yes    Partners: Female

## 2019-12-31 ENCOUNTER — Encounter: Payer: Self-pay | Admitting: Physician Assistant

## 2019-12-31 ENCOUNTER — Ambulatory Visit (INDEPENDENT_AMBULATORY_CARE_PROVIDER_SITE_OTHER): Payer: Medicare Other | Admitting: Physician Assistant

## 2019-12-31 ENCOUNTER — Telehealth: Payer: Self-pay

## 2019-12-31 VITALS — Ht 68.0 in | Wt 388.0 lb

## 2019-12-31 DIAGNOSIS — Z89522 Acquired absence of left knee: Secondary | ICD-10-CM

## 2019-12-31 DIAGNOSIS — Z89529 Acquired absence of unspecified knee: Secondary | ICD-10-CM

## 2019-12-31 NOTE — Progress Notes (Signed)
Office Visit Note   Patient: [name]           Date of Birth: February 01, 1951           MRN: [id number] Visit Date: 12/31/2019              Requested by: Bernerd Limbo, MD [place] [address] [place],  [place] [address] PCP: Bernerd Limbo, MD  Chief Complaint  Patient presents with  . 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  He is at a nursing facility.  He states his biggest concern right now is that when he transferred from his rehab facility to a long care facility none of his medications came with him.  He is working especially with his primary care doctor to have his medications either refilled or released to him apparently the doctor at the facility is working on this as well he has no complaints about his amputation stump other than he is getting quite a bit of phantom pain.  Currently supposed to be taking 600 mg of gabapentin 3 times daily  Assessment & Plan: Visit Diagnoses: No diagnosis found.  Plan: Have given him a prescription to work with [employer] for prosthetic.  Should follow-up with [place] in 4 weeks.  Follow-Up Instructions: No follow-ups on file.   Ortho Exam  Patient is alert, oriented, no adenopathy, well-dressed, normal affect, normal respiratory effort. Examination well-healed amputation no wound dehiscence no cellulitis no signs of infection no necrosis  Imaging: No results found. No images are attached to the encounter.  Labs: Lab Results  Component Value Date   HGBA1C 5.5 11/02/2019   HGBA1C 5.3 08/26/2012   HGBA1C  05/16/2008    5.6 (NOTE) The ADA recommends the following therapeutic goal for glycemic control related to Hgb A1c measurement: Goal of therapy: <6.5 Hgb A1c  Reference: American Diabetes Association: Clinical Practice Recommendations 2010, Diabetes Care, 2010, 33: (Suppl  1).   ESRSEDRATE 64 (H) 02/12/2019   ESRSEDRATE 53 (H) 02/05/2019    ESRSEDRATE 75 (H) 01/29/2019   CRP 4.1 (H) 02/12/2019   CRP 3.9 (H) 02/05/2019   CRP 3.6 (H) 01/29/2019   REPTSTATUS 11/09/2019 FINAL 11/06/2019   REPTSTATUS 11/12/2019 FINAL 11/06/2019   GRAMSTAIN  11/06/2019    RARE WBC PRESENT,BOTH PMN AND MONONUCLEAR RARE GRAM POSITIVE COCCI IN PAIRS Performed at [facility], [place] [address] [place], [place] [address]    GRAMSTAIN  11/06/2019    ABUNDANT WBC PRESENT,BOTH PMN AND MONONUCLEAR FEW GRAM POSITIVE COCCI    CULT FEW ENTEROCOCCUS FAECALIS 11/06/2019   CULT  11/06/2019    NO ANAEROBES ISOLATED Performed at [facility], [place] [address] [place], [place] [address]    LABORGA ENTEROCOCCUS FAECALIS 11/06/2019     Lab Results  Component Value Date   ALBUMIN 3.4 (L) 05/14/2019   ALBUMIN 3.8 01/25/2013   ALBUMIN 3.4 (L) 08/30/2012    Lab Results  Component Value Date   MG 2.1 09/02/2012   MG 2.2 08/30/2012   MG 2.1 08/29/2012   No results found for: VD25OH  No results found for: PREALBUMIN CBC EXTENDED Latest Ref Rng & Units 11/18/2019 11/17/2019 11/08/2019  WBC 4.0 - 10.5 K/uL - - 7.6  RBC 4.22 - 5.81 MIL/uL - - 2.91(L)  HGB 13.0 - 17.0 g/dL 8.4(L) 8.5(L) 8.7(L)  HCT 39 - 52 %  27.9(L) 25.0(L) 29.4(L)  PLT 150 - 400 K/uL - - 344  NEUTROABS 1.7 - 7.7 K/uL - - -  LYMPHSABS 0.7 - 4.0 K/uL - - -     Body mass index is 59 kg/m.  Orders:  No orders of the defined types were placed in this encounter.  No orders of the defined types were placed in this encounter.    Procedures: No procedures performed  Clinical Data: No additional findings.  ROS:  All other systems negative, except as noted in the HPI. Review of Systems  Objective: Vital Signs: Ht 5\' 8"  (1.727 m)   Wt (!) 388 lb (176 kg)   BMI 59.00 kg/m   Specialty Comments:  No specialty comments available.  PMFS History: Patient Active Problem List   Diagnosis Date Noted  . Infection of prosthetic left knee joint ([place]) 11/08/2019  . Septic  joint of left knee joint ([place]) 11/06/2019  . 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[place]) 08/15/2019  . Open knee wound 08/14/2019  . S/P left TK revision 04/03/2019  . Administration of long-term prophylactic antibiotics   . History of streptococcal infection   . History of DVT (deep vein thrombosis)   . S/P rev left TK 12/15/2015  . 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 Preoperative cardiovascular examination 04/17/2013  . OSA on CPAP 11/27/2012  . Chronic diastolic heart failure ([place]) 09/10/2012  . Chronic anticoagulation, with Xarelto 09/10/2012  . DVT (deep venous thrombosis), possible 09/10/2012  . SOB (shortness of breath) 08/26/2012  . Chest discomfort 08/26/2012  . Persistent atrial fibrillation ([place]) 08/26/2012  . Super obesity 06/27/2012  . Expected blood loss anemia 06/27/2012  . S/P left TK revision 06/23/2012  . Septic arthritis of knee ([place])   . Cellulitis 09/19/2010  . METHICILLIN RESISTANT STAPHYLOCOCCUS AUREUS INFECTION 09/10/2009  . 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 DM 07/04/2008  . CHRONIC KIDNEY DISEASE UNSPECIFIED 07/04/2008  . PULMONARY EMBOLISM, HX OF 07/04/2008  . INFECTION DUE TO INTERNAL ORTH DEVICE NEC 03/02/2006   Past Medical History:  Diagnosis Date  . CHF (congestive heart failure) ([place]) 05/2019  . Chronic anticoagulation    with Xarelto  . Complication of anesthesia    PT STATES HARD TO WAKE UP AFTER ONE SUGERY -STATES THE SURGERY TOOK LONGER THAN EXPECTED.  NO PROBLEMS WITH ANY OTHER SURGERY  . Complication of anesthesia    in 12/2018-states started  shaking after surgery, could not get warm   . Dysrhythmia    A-fib  . GERD (gastroesophageal reflux disease)   . History of blood clots   . History of blood transfusion   . Hypothyroidism   . Pain    BACK PAIN - PT ATTRIBUTES TO THE WAY HE WALKS DUE TO LEFT KNEE PROBLEM  . Persistent atrial fibrillation ([place])   . Septic arthritis of knee ([facility])    LEFT KNEE  .  Shortness of breath    WITH EXERTION AND PAIN  . Sleep apnea    uses CPAP,    Family History  Problem Relation Age of Onset  . Heart disease Father 77  . Pancreatic cancer Sister   . Liver cancer Sister   . Cervical cancer Sister   . Breast cancer Sister   . Diabetes Sister   . Colon cancer Neg Hx   . Throat cancer Neg Hx   . Stomach cancer Neg Hx   . Kidney disease Neg Hx   . Liver disease Neg  Hx     Past Surgical History:  Procedure Laterality Date  . 2010 REMOVAL OF LEFT TOTAL KNEE    . AMPUTATION Left 11/17/2019   Procedure: AMPUTATION ABOVE KNEE;  Surgeon: Newt Minion, MD;  Location: [facility];  Service: Orthopedics;  Laterality: Left;  . CARDIAC CATHETERIZATION  2006  . CARDIOVERSION N/A 08/28/2012   Procedure: CARDIOVERSION;  Surgeon: Sanda Klein, MD;  Location: [facility];  Service: Cardiovascular;  Laterality: N/A;  . CARDIOVERSION N/A 09/01/2012   Procedure: CARDIOVERSION;  Surgeon: Pixie Casino, MD;  Location: [facility];  Service: Cardiovascular;  Laterality: N/A;  . COLONOSCOPY N/A 06/13/2013   Procedure: COLONOSCOPY;  Surgeon: Inda Castle, MD;  [facility];  Service: Endoscopy;  Laterality: N/A;  . EXCISIONAL TOTAL KNEE ARTHROPLASTY WITH ANTIBIOTIC SPACERS Left 09/21/2018   Procedure: Resection of tibia versus both components with placement of antibiotic spacer;  Surgeon: Paralee Cancel, MD;  [facility];  Service: Orthopedics;  Laterality: Left;  2.5 hrs  . EXCISIONAL TOTAL KNEE ARTHROPLASTY WITH ANTIBIOTIC SPACERS Left 01/02/2019   Procedure: Repeat Washout and placement of antibiotic spacer left knee;  Surgeon: Paralee Cancel, MD;  [facility];  Service: Orthopedics;  Laterality: Left;  2 hrs  . HYDROCELECTOY   2012  . I & D EXTREMITY Left 08/14/2019   Procedure: IRRIGATION AND DEBRIDEMENT LEFT KNEE WOUND;  Surgeon: Paralee Cancel, MD;  [facility];  Service: Orthopedics;  Laterality: Left;  60 mins  . I & D KNEE WITH POLY EXCHANGE Left 05/17/2019     Procedure: IRRIGATION AND DEBRIDEMENT KNEE WITH POLY EXCHANGE;  Surgeon: Paralee Cancel, MD;  [facility];  Service: Orthopedics;  Laterality: Left;  90 mins  . IRRIGATION AND DEBRIDEMENT KNEE Left 04/03/2019   Procedure: REIMPLANTATION OF LEFT KNEE TIBIAL COMPONENT.;  Surgeon: Paralee Cancel, MD;  [facility];  Service: Orthopedics;  Laterality: Left;  need 120 min  . IRRIGATION AND DEBRIDEMENT KNEE Left 11/06/2019   Procedure: IRRIGATION AND DEBRIDEMENT KNEE;  Surgeon: Paralee Cancel, MD;  [facility];  Service: Orthopedics;  Laterality: Left;  90 mins  . LEFT KNEE ARTHROSCOPY  1999  . LEFT KNEE SURGERY UPPER TIBIAL OSTOMY    . LEFT TOTAL KNEE REMOVAL FOR INFECTION  2006  . REIMPLANTATION LEFT TOTAL KNEE  2008  . REIMPLANTATION LEFT TOTAL KNEE   2006  . REIMPLANTATION OF TOTAL KNEE Left 06/23/2012   Procedure: REIMPLANTATION OF LEFT TOTAL KNEE;  Surgeon: Mauri Pole, MD;  [facility];  Service: Orthopedics;  Laterality: Left;  . REMOVAL LEFT TOTAL KNEE   2008  . REPLACEMENT LEFT KNEE  2002  . REPLACEMENT RIGHT KNEE  2003  . REVISION LEFT KNEE CAP   2004  . RIGHT KNEE ARTHROSCOPY  1998  . TEE WITHOUT CARDIOVERSION N/A 08/28/2012   Procedure: TRANSESOPHAGEAL ECHOCARDIOGRAM (TEE);  Surgeon: Sanda Klein, MD;  Location: [facility];  Service: Cardiovascular;  Laterality: N/A;  . TONSILLECTOMY    . TOTAL KNEE REVISION Left 12/15/2015   Procedure: TOTAL KNEE REVISION REPLACEMENT;  Surgeon: Paralee Cancel, MD;  [facility];  Service: Orthopedics;  Laterality: Left;  Adductor Block   Social History   Occupational History  . Not on file  Tobacco Use  . Smoking status: Never Smoker  . Smokeless tobacco: Never Used  Vaping Use  . Vaping Use: Never used  Substance and Sexual Activity  . Alcohol use: Not Currently    Comment: not since 1999  .  Drug use: No  . Sexual activity: Yes    Partners: Female

## 2019-12-31 NOTE — Telephone Encounter (Signed)
Tanya from [facility] calling in to get orders from patient    Nursing 1x a week for  Weeks   1x a month for 1 month   1 prn

## 2019-12-31 NOTE — Telephone Encounter (Signed)
Called and lm on vm to advise verbal ok for orders as requested below for left AKA

## 2020-01-02 ENCOUNTER — Telehealth: Payer: Self-pay | Admitting: Orthopedic Surgery

## 2020-01-02 NOTE — Telephone Encounter (Signed)
Received call from Edmon Crape (OT) needing verbal orders for OT 2 Wk 4 and 1 Wk 3   The number to contact [name] is [phone]

## 2020-01-02 NOTE — Telephone Encounter (Signed)
Called and lm on vm to give verbal ok for orders below.

## 2020-01-07 ENCOUNTER — Telehealth: Payer: Self-pay

## 2020-01-07 NOTE — Telephone Encounter (Signed)
I called and advised verbal ok for PT order requested below.

## 2020-01-07 NOTE — Telephone Encounter (Signed)
Richardson Landry, PT with [facility] would like verbal orders for 1 x week for 8 weeks.  Cb# [phone].  Please advise.  Thank you.

## 2020-01-28 ENCOUNTER — Ambulatory Visit: Payer: Medicare Other | Admitting: Physician Assistant

## 2020-01-29 ENCOUNTER — Encounter: Payer: Self-pay | Admitting: Physician Assistant

## 2020-01-29 ENCOUNTER — Ambulatory Visit (INDEPENDENT_AMBULATORY_CARE_PROVIDER_SITE_OTHER): Payer: Medicare Other | Admitting: Physician Assistant

## 2020-01-29 DIAGNOSIS — Z89529 Acquired absence of unspecified knee: Secondary | ICD-10-CM

## 2020-01-29 NOTE — Progress Notes (Addendum)
Office Visit Note   Patient: [name]           Date of Birth: [birth year]           MRN: [id number] Visit Date: 01/29/2020              Requested by: Bernerd Limbo, MD [place] [address] [place],  [place] [address] PCP: Bernerd Limbo, MD  No chief complaint on file.     HPI: This is a pleasant 69 year old gentleman who is a little over 2 months status post left above-knee amputation.  Overall he is doing well.  He has been working with Museum/gallery curator on getting a new prosthetic.  Assessment & Plan: Visit Diagnoses: No diagnosis found.  Plan: Patient may follow-up as needed.  I did give him information for a support group for amputees.  Follow-Up Instructions: No follow-ups on file.   Ortho Exam  Patient is alert, oriented, no adenopathy, well-dressed, normal affect, normal respiratory effort. Well-healed surgical incision.  Mild to moderate soft tissue swelling but the amputation stump is soft.  No cellulitis no signs of infection  Patient is a new left transfemoral amputee.  Patient's current comorbidities are not expected to impact the ability to function with the prescribed prosthesis. Patient verbally communicates a strong desire to use a prosthesis. Patient currently requires mobility aids to ambulate without a prosthesis.  Expects not to use mobility aids with a new prosthesis.  Patient is a K3 level ambulator that spends a lot of time walking around on uneven terrain over obstacles, up and down stairs, and ambulates with a variable cadence.    Imaging: No results found. No images are attached to the encounter.  Labs: Lab Results  Component Value Date   HGBA1C 5.5 11/02/2019   HGBA1C 5.3 08/26/2012   HGBA1C  05/16/2008    5.6 (NOTE) The ADA recommends the following therapeutic goal for glycemic control related to Hgb A1c measurement: Goal of therapy: <6.5 Hgb A1c  Reference: American Diabetes Association: Clinical Practice Recommendations 2010,  Diabetes Care, 2010, 33: (Suppl  1).   ESRSEDRATE 64 (H) 02/12/2019   ESRSEDRATE 53 (H) 02/05/2019   ESRSEDRATE 75 (H) 01/29/2019   CRP 4.1 (H) 02/12/2019   CRP 3.9 (H) 02/05/2019   CRP 3.6 (H) 01/29/2019   REPTSTATUS 11/09/2019 FINAL 11/06/2019   REPTSTATUS 11/12/2019 FINAL 11/06/2019   GRAMSTAIN  11/06/2019    RARE WBC PRESENT,BOTH PMN AND MONONUCLEAR RARE GRAM POSITIVE COCCI IN PAIRS Performed at [facility], [place] [address], [place], [place] [address]    GRAMSTAIN  11/06/2019    ABUNDANT WBC PRESENT,BOTH PMN AND MONONUCLEAR FEW GRAM POSITIVE COCCI    CULT FEW ENTEROCOCCUS FAECALIS 11/06/2019   CULT  11/06/2019    NO ANAEROBES ISOLATED Performed at [facility], [place] [address], [place], [place] [address]    LABORGA ENTEROCOCCUS FAECALIS 11/06/2019     Lab Results  Component Value Date   ALBUMIN 3.4 (L) 05/14/2019   ALBUMIN 3.8 01/25/2013   ALBUMIN 3.4 (L) 08/30/2012    Lab Results  Component Value Date   MG 2.1 09/02/2012   MG 2.2 08/30/2012   MG 2.1 08/29/2012   No results found for: VD25OH  No results found for: PREALBUMIN CBC EXTENDED Latest Ref Rng & Units 11/18/2019 11/17/2019 11/08/2019  WBC 4.0 - 10.5 K/uL - - 7.6  RBC 4.22 - 5.81 MIL/uL - - 2.91(L)  HGB 13.0 - 17.0 g/dL 8.4(L) 8.5(L) 8.7(L)  HCT 39.0 - 52.0 % 27.9(L) 25.0(L) 29.4(L)  PLT 150 - 400 K/uL - - 344  NEUTROABS 1.7 - 7.7 K/uL - - -  LYMPHSABS 0.7 - 4.0 K/uL - - -     There is no height or weight on file to calculate BMI.  Orders:  No orders of the defined types were placed in this encounter.  No orders of the defined types were placed in this encounter.    Procedures: No procedures performed  Clinical Data: No additional findings.  ROS:  All other systems negative, except as noted in the HPI. Review of Systems  Objective: Vital Signs: There were no vitals taken for this visit.  Specialty Comments:  No specialty comments available.  PMFS History: Patient  Active Problem List   Diagnosis Date Noted  . Infection of prosthetic left knee joint ([place]) 11/08/2019  . Septic joint of left knee joint ([place]) 11/06/2019  . 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[place]) 08/15/2019  . Open knee wound 08/14/2019  . S/P left TK revision 04/03/2019  . Administration of long-term prophylactic antibiotics   . History of streptococcal infection   . History of DVT (deep vein thrombosis)   . S/P rev left TK 12/15/2015  . 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 Preoperative cardiovascular examination 04/17/2013  . OSA on CPAP 11/27/2012  . Chronic diastolic heart failure ([place]) 09/10/2012  . Chronic anticoagulation, with Xarelto 09/10/2012  . DVT (deep venous thrombosis), possible 09/10/2012  . SOB (shortness of breath) 08/26/2012  . Chest discomfort 08/26/2012  . Persistent atrial fibrillation ([place]) 08/26/2012  . Super obesity 06/27/2012  . Expected blood loss anemia 06/27/2012  . S/P left TK revision 06/23/2012  . Septic arthritis of knee ([place])   . Cellulitis 09/19/2010  . METHICILLIN RESISTANT STAPHYLOCOCCUS AUREUS INFECTION 09/10/2009  . 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 DM 07/04/2008  . CHRONIC KIDNEY DISEASE UNSPECIFIED 07/04/2008  . PULMONARY EMBOLISM, HX OF 07/04/2008  . INFECTION DUE TO INTERNAL ORTH DEVICE NEC 03/02/2006   Past Medical History:  Diagnosis Date  . CHF (congestive heart failure) ([place]) 05/2019  . Chronic anticoagulation    with Xarelto  . Complication of anesthesia    PT STATES HARD TO WAKE UP AFTER ONE SUGERY -STATES THE SURGERY TOOK LONGER THAN EXPECTED.  NO PROBLEMS WITH ANY OTHER SURGERY  . Complication of anesthesia    in 12/2018-states started  shaking after surgery, could not get warm   . Dysrhythmia    A-fib  . GERD (gastroesophageal reflux disease)   . History of blood clots   . History of blood transfusion   . Hypothyroidism   . Pain    BACK PAIN - PT ATTRIBUTES TO THE WAY HE WALKS DUE TO  LEFT KNEE PROBLEM  . Persistent atrial fibrillation ([place])   . Septic arthritis of knee ([facility])    LEFT KNEE  . Shortness of breath    WITH EXERTION AND PAIN  . Sleep apnea    uses CPAP,    Family History  Problem Relation Age of Onset  . Heart disease Father 29  . Pancreatic cancer Sister   . Liver cancer Sister   . Cervical cancer Sister   . Breast cancer Sister   . Diabetes Sister   . Colon cancer Neg Hx   . Throat cancer Neg Hx   . Stomach cancer Neg Hx   . Kidney disease Neg Hx   . Liver disease Neg Hx  Past Surgical History:  Procedure Laterality Date  . 2010 REMOVAL OF LEFT TOTAL KNEE    . AMPUTATION Left 11/17/2019   Procedure: AMPUTATION ABOVE KNEE;  Surgeon: Newt Minion, MD;  Location: [facility];  Service: Orthopedics;  Laterality: Left;  . CARDIAC CATHETERIZATION  2006  . CARDIOVERSION N/A 08/28/2012   Procedure: CARDIOVERSION;  Surgeon: Sanda Klein, MD;  Location: [facility];  Service: Cardiovascular;  Laterality: N/A;  . CARDIOVERSION N/A 09/01/2012   Procedure: CARDIOVERSION;  Surgeon: Pixie Casino, MD;  Location: [facility];  Service: Cardiovascular;  Laterality: N/A;  . COLONOSCOPY N/A 06/13/2013   Procedure: COLONOSCOPY;  Surgeon: Inda Castle, MD;  [facility];  Service: Endoscopy;  Laterality: N/A;  . EXCISIONAL TOTAL KNEE ARTHROPLASTY WITH ANTIBIOTIC SPACERS Left 09/21/2018   Procedure: Resection of tibia versus both components with placement of antibiotic spacer;  Surgeon: Paralee Cancel, MD;  [facility];  Service: Orthopedics;  Laterality: Left;  2.5 hrs  . EXCISIONAL TOTAL KNEE ARTHROPLASTY WITH ANTIBIOTIC SPACERS Left 01/02/2019   Procedure: Repeat Washout and placement of antibiotic spacer left knee;  Surgeon: Paralee Cancel, MD;  [facility];  Service: Orthopedics;  Laterality: Left;  2 hrs  . HYDROCELECTOY   2012  . I & D EXTREMITY Left 08/14/2019   Procedure: IRRIGATION AND DEBRIDEMENT LEFT KNEE WOUND;  Surgeon: Paralee Cancel, MD;   [facility];  Service: Orthopedics;  Laterality: Left;  60 mins  . I & D KNEE WITH POLY EXCHANGE Left 05/17/2019   Procedure: IRRIGATION AND DEBRIDEMENT KNEE WITH POLY EXCHANGE;  Surgeon: Paralee Cancel, MD;  [facility];  Service: Orthopedics;  Laterality: Left;  90 mins  . IRRIGATION AND DEBRIDEMENT KNEE Left 04/03/2019   Procedure: REIMPLANTATION OF LEFT KNEE TIBIAL COMPONENT.;  Surgeon: Paralee Cancel, MD;  [facility];  Service: Orthopedics;  Laterality: Left;  need 120 min  . IRRIGATION AND DEBRIDEMENT KNEE Left 11/06/2019   Procedure: IRRIGATION AND DEBRIDEMENT KNEE;  Surgeon: Paralee Cancel, MD;  [facility];  Service: Orthopedics;  Laterality: Left;  90 mins  . LEFT KNEE ARTHROSCOPY  1999  . LEFT KNEE SURGERY UPPER TIBIAL OSTOMY    . LEFT TOTAL KNEE REMOVAL FOR INFECTION  2006  . REIMPLANTATION LEFT TOTAL KNEE  2008  . REIMPLANTATION LEFT TOTAL KNEE   2006  . REIMPLANTATION OF TOTAL KNEE Left 06/23/2012   Procedure: REIMPLANTATION OF LEFT TOTAL KNEE;  Surgeon: Mauri Pole, MD;  [facility];  Service: Orthopedics;  Laterality: Left;  . REMOVAL LEFT TOTAL KNEE   2008  . REPLACEMENT LEFT KNEE  2002  . REPLACEMENT RIGHT KNEE  2003  . REVISION LEFT KNEE CAP   2004  . RIGHT KNEE ARTHROSCOPY  1998  . TEE WITHOUT CARDIOVERSION N/A 08/28/2012   Procedure: TRANSESOPHAGEAL ECHOCARDIOGRAM (TEE);  Surgeon: Sanda Klein, MD;  Location: [facility];  Service: Cardiovascular;  Laterality: N/A;  . TONSILLECTOMY    . TOTAL KNEE REVISION Left 12/15/2015   Procedure: TOTAL KNEE REVISION REPLACEMENT;  Surgeon: Paralee Cancel, MD;  [facility];  Service: Orthopedics;  Laterality: Left;  Adductor Block   Social History   Occupational History  . Not on file  Tobacco Use  . Smoking status: Never Smoker  . Smokeless tobacco: Never Used  Vaping Use  . Vaping Use: Never used  Substance and Sexual Activity  . Alcohol use: Not Currently    Comment: not since 1999  . Drug use: No  .  Sexual activity: Yes    Partners: Female

## 2020-04-24 ENCOUNTER — Emergency Department (HOSPITAL_COMMUNITY)
Admission: EM | Admit: 2020-04-24 | Discharge: 2020-04-25 | Disposition: A | Discharge status: Home / Self Care | Payer: Medicare Other | Attending: Emergency Medicine | Admitting: Emergency Medicine

## 2020-04-24 ENCOUNTER — Other Ambulatory Visit: Payer: Self-pay

## 2020-04-24 ENCOUNTER — Encounter (HOSPITAL_COMMUNITY): Payer: Self-pay

## 2020-04-24 DIAGNOSIS — Z86711 Personal history of pulmonary embolism: Secondary | ICD-10-CM | POA: Insufficient documentation

## 2020-04-24 DIAGNOSIS — E039 Hypothyroidism, unspecified: Secondary | ICD-10-CM | POA: Diagnosis not present

## 2020-04-24 DIAGNOSIS — I4891 Unspecified atrial fibrillation: Secondary | ICD-10-CM | POA: Insufficient documentation

## 2020-04-24 DIAGNOSIS — Z86718 Personal history of other venous thrombosis and embolism: Secondary | ICD-10-CM | POA: Diagnosis not present

## 2020-04-24 DIAGNOSIS — R2241 Localized swelling, mass and lump, right lower limb: Secondary | ICD-10-CM | POA: Diagnosis present

## 2020-04-24 DIAGNOSIS — Z79899 Other long term (current) drug therapy: Secondary | ICD-10-CM | POA: Insufficient documentation

## 2020-04-24 DIAGNOSIS — E119 Type 2 diabetes mellitus without complications: Secondary | ICD-10-CM | POA: Diagnosis not present

## 2020-04-24 DIAGNOSIS — I5032 Chronic diastolic (congestive) heart failure: Secondary | ICD-10-CM | POA: Insufficient documentation

## 2020-04-24 DIAGNOSIS — Z7901 Long term (current) use of anticoagulants: Secondary | ICD-10-CM | POA: Diagnosis not present

## 2020-04-24 DIAGNOSIS — Z96653 Presence of artificial knee joint, bilateral: Secondary | ICD-10-CM | POA: Insufficient documentation

## 2020-04-24 DIAGNOSIS — L03115 Cellulitis of right lower limb: Secondary | ICD-10-CM | POA: Diagnosis not present

## 2020-04-24 MED ORDER — CLOTRIMAZOLE 1 % EX CREA: TOPICAL_CREAM | CUTANEOUS | 0 refills | Status: DC

## 2020-04-24 MED ORDER — DOXYCYCLINE HYCLATE 100 MG PO CAPS: 100.0000 mg | ORAL_CAPSULE | Freq: Two times a day (BID) | ORAL | 0 refills | Status: DC

## 2020-04-24 NOTE — Discharge Instructions (Addendum)
You were seen in the emergency department today for right lower leg redness  We are sending you home with doxycycline which is an antibiotic to take twice daily for cellulitis.  We are also sending you home with clotrimazole to apply to the back of your knee twice per day for the next 2 weeks for possible yeast involvement.  We have prescribed you new medication(s) today. Discuss the medications prescribed today with your pharmacist as they can have adverse effects and interactions with your other medicines including over the counter and prescribed medications. Seek medical evaluation if you start to experience new or abnormal symptoms after taking one of these medicines, seek care immediately if you start to experience difficulty breathing, feeling of your throat closing, facial swelling, or rash as these could be indications of a more serious allergic reaction  Please try to keep skin clean and dry.  Please follow with your primary care provider for recheck of this area within 3 days.  Return to the ED for new or worsening symptoms including but not limited to spreading redness, increased pain, fever, chills, trouble moving the right knee/ankle, or any other concerns.

## 2020-04-24 NOTE — ED Triage Notes (Signed)
Pt arrives EMS from [facility] with c/o right leg redness, swelling and possible recurrent cellulitis. Left above knee amputee .

## 2020-04-24 NOTE — ED Provider Notes (Signed)
[facility] DEPT Provider Note   CSN: [id number] Arrival date & time: 04/24/20  1857     History Chief Complaint  Patient presents with  . Cellulitis    [name] is a 70 y.o. 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 Patient states he developed right lower leg redness to the lower leg and to the medial knee area, has started to become mildly uncomfortable over the past few days. He is concerned it is infected. He is not currently on abx. He denies fever, chills, nausea, vomiting, cough, dyspnea, or abdominal pain. He states that he has some swelling in the right lower extremity, but this is not particularly new. He remains on xarelto.   Prior left knee septic arthritis   HPI     Past Medical History:  Diagnosis Date  . CHF (congestive heart failure) ([place]) 05/2019  . Chronic anticoagulation    with Xarelto  . Complication of anesthesia    PT STATES HARD TO WAKE UP AFTER ONE SUGERY -STATES THE SURGERY TOOK LONGER THAN EXPECTED.  NO PROBLEMS WITH ANY OTHER SURGERY  . Complication of anesthesia    in 12/2018-states started  shaking after surgery, could not get warm   . Dysrhythmia    A-fib  . GERD (gastroesophageal reflux disease)   . History of blood clots   . History of blood transfusion   . Hypothyroidism   . Pain    BACK PAIN - PT ATTRIBUTES TO THE WAY HE WALKS DUE TO LEFT KNEE PROBLEM  . Persistent atrial fibrillation ([place])   . Septic arthritis of knee ([facility])    LEFT KNEE  . Shortness of breath    WITH EXERTION AND PAIN  . Sleep apnea    uses CPAP,    Patient Active Problem List   Diagnosis Date Noted  . Infection of prosthetic left knee joint ([place]) 11/08/2019  . Septic joint of  left knee joint ([place]) 11/06/2019  . 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[place]) 08/15/2019  . Open knee wound 08/14/2019  . S/P left TK revision 04/03/2019  . Administration of long-term prophylactic antibiotics   . History of streptococcal infection   . History of DVT (deep vein thrombosis)   . S/P rev left TK 12/15/2015  . 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 Preoperative cardiovascular examination 04/17/2013  . OSA on CPAP 11/27/2012  . Chronic diastolic heart failure ([place]) 09/10/2012  . Chronic anticoagulation, with Xarelto 09/10/2012  . DVT (deep venous thrombosis), possible 09/10/2012  . SOB (shortness of breath) 08/26/2012  . Chest discomfort 08/26/2012  . Persistent atrial fibrillation ([place]) 08/26/2012  . Super obesity 06/27/2012  . Expected blood loss anemia 06/27/2012  . S/P left TK revision 06/23/2012  . Septic arthritis of knee ([place])   . Cellulitis 09/19/2010  . METHICILLIN RESISTANT STAPHYLOCOCCUS AUREUS INFECTION 09/10/2009  . 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 DM 07/04/2008  . CHRONIC KIDNEY DISEASE UNSPECIFIED 07/04/2008  . PULMONARY EMBOLISM, HX OF 07/04/2008  . INFECTION DUE TO INTERNAL ORTH DEVICE NEC 03/02/2006    Past Surgical History:  Procedure Laterality Date  . 2010 REMOVAL OF LEFT TOTAL KNEE    . AMPUTATION Left 11/17/2019   Procedure: AMPUTATION ABOVE  KNEE;  Surgeon: Newt Minion, MD;  Location: [facility];  Service: Orthopedics;  Laterality: Left;  . CARDIAC CATHETERIZATION  2006  . CARDIOVERSION N/A 08/28/2012   Procedure: CARDIOVERSION;  Surgeon: Sanda Klein, MD;  Location: [facility];  Service: Cardiovascular;  Laterality: N/A;  . CARDIOVERSION N/A 09/01/2012   Procedure: CARDIOVERSION;  Surgeon: Pixie Casino, MD;  Location: [facility];  Service: Cardiovascular;  Laterality: N/A;  . COLONOSCOPY N/A 06/13/2013   Procedure: COLONOSCOPY;  Surgeon: Inda Castle, MD;  Location: [facility];  Service: Endoscopy;  Laterality: N/A;   . EXCISIONAL TOTAL KNEE ARTHROPLASTY WITH ANTIBIOTIC SPACERS Left 09/21/2018   Procedure: Resection of tibia versus both components with placement of antibiotic spacer;  Surgeon: Paralee Cancel, MD;  Location: [facility];  Service: Orthopedics;  Laterality: Left;  2.5 hrs  . EXCISIONAL TOTAL KNEE ARTHROPLASTY WITH ANTIBIOTIC SPACERS Left 01/02/2019   Procedure: Repeat Washout and placement of antibiotic spacer left knee;  Surgeon: Paralee Cancel, MD;  Location: [facility];  Service: Orthopedics;  Laterality: Left;  2 hrs  . HYDROCELECTOY   2012  . I & D EXTREMITY Left 08/14/2019   Procedure: IRRIGATION AND DEBRIDEMENT LEFT KNEE WOUND;  Surgeon: Paralee Cancel, MD;  Location: [facility];  Service: Orthopedics;  Laterality: Left;  60 mins  . I & D KNEE WITH POLY EXCHANGE Left 05/17/2019   Procedure: IRRIGATION AND DEBRIDEMENT KNEE WITH POLY EXCHANGE;  Surgeon: Paralee Cancel, MD;  Location: [facility];  Service: Orthopedics;  Laterality: Left;  90 mins  . IRRIGATION AND DEBRIDEMENT KNEE Left 04/03/2019   Procedure: REIMPLANTATION OF LEFT KNEE TIBIAL COMPONENT.;  Surgeon: Paralee Cancel, MD;  Location: [facility];  Service: Orthopedics;  Laterality: Left;  need 120 min  . IRRIGATION AND DEBRIDEMENT KNEE Left 11/06/2019   Procedure: IRRIGATION AND DEBRIDEMENT KNEE;  Surgeon: Paralee Cancel, MD;  Location: [facility];  Service: Orthopedics;  Laterality: Left;  90 mins  . LEFT KNEE ARTHROSCOPY  1999  . LEFT KNEE SURGERY UPPER TIBIAL OSTOMY    . LEFT TOTAL KNEE REMOVAL FOR INFECTION  2006  . REIMPLANTATION LEFT TOTAL KNEE  2008  . REIMPLANTATION LEFT TOTAL KNEE   2006  . REIMPLANTATION OF TOTAL KNEE Left 06/23/2012   Procedure: REIMPLANTATION OF LEFT TOTAL KNEE;  Surgeon: Mauri Pole, MD;  Location: [facility];  Service: Orthopedics;  Laterality: Left;  . REMOVAL LEFT TOTAL KNEE   2008  . REPLACEMENT LEFT KNEE  2002  . REPLACEMENT RIGHT KNEE  2003  . REVISION LEFT KNEE CAP   2004  . RIGHT KNEE ARTHROSCOPY  1998  . TEE WITHOUT  CARDIOVERSION N/A 08/28/2012   Procedure: TRANSESOPHAGEAL ECHOCARDIOGRAM (TEE);  Surgeon: Sanda Klein, MD;  Location: [facility];  Service: Cardiovascular;  Laterality: N/A;  . TONSILLECTOMY    . TOTAL KNEE REVISION Left 12/15/2015   Procedure: TOTAL KNEE REVISION REPLACEMENT;  Surgeon: Paralee Cancel, MD;  Location: [facility];  Service: Orthopedics;  Laterality: Left;  Adductor Block       Family History  Problem Relation Age of Onset  . Heart disease Father 74  . Pancreatic cancer Sister   . Liver cancer Sister   . Cervical cancer Sister   . Breast cancer Sister   . Diabetes Sister   . Colon cancer Neg Hx   . Throat cancer Neg Hx   . Stomach cancer Neg Hx   . Kidney disease Neg Hx   . Liver disease Neg Hx     Social  History   Tobacco Use  . Smoking status: Never Smoker  . Smokeless tobacco: Never Used  Vaping Use  . Vaping Use: Never used  Substance Use Topics  . Alcohol use: Not Currently    Comment: not since 1999  . Drug use: No    Home Medications Prior to Admission medications   Medication Sig Start Date End Date Taking? Authorizing Provider  diltiazem (CARDIZEM CD) 240 MG 24 hr capsule TAKE 1 CAPSULE BY MOUTH DAILY Patient taking differently: Take 240 mg by mouth daily.  10/10/18   Croitoru, Mihai, MD  ferrous sulfate (FERROUSUL) 325 (65 FE) MG tablet Take 1 tablet (325 mg total) by mouth 3 (three) times daily with meals for 14 days. Patient taking differently: Take 325 mg by mouth daily.  08/16/19 10/30/20  Danae Orleans, PA-C  furosemide (LASIX) 20 MG tablet Take 20 mg by mouth daily as needed (fluid retention.).  08/29/19   [provider]  levothyroxine (SYNTHROID) 125 MCG tablet Take 125 mcg by mouth daily before breakfast.  10/31/15   [provider]  methocarbamol (ROBAXIN) 500 MG tablet Take 1 tablet (500 mg total) by mouth every 6 (six) hours as needed for muscle spasms. 08/16/19   Danae Orleans, PA-C  metoprolol tartrate (LOPRESSOR) 50 MG tablet  TAKE 1 TABLET BY MOUTH TWICE DAILY Patient taking differently: Take 50 mg by mouth 2 (two) times daily.  12/12/18   Croitoru, Mihai, MD  Multiple Vitamin (MULTIVITAMIN WITH MINERALS) TABS Take 1 tablet by mouth daily.    [provider]  naphazoline-glycerin (CLEAR EYES REDNESS) 0.012-0.2 % SOLN Place 1-2 drops into both eyes 4 (four) times daily as needed for eye irritation. 11/21/19   Persons, Bevely Palmer, PA  oxyCODONE (OXY IR/ROXICODONE) 5 MG immediate release tablet Take 1-2 tablets (5-10 mg total) by mouth every 4 (four) hours as needed for moderate pain (pain score 4-6). 11/20/19   Persons, Bevely Palmer, PA  XARELTO 20 MG TABS tablet TAKE 1 TABLET BY MOUTH DAILY WITH BREAKFAST Patient taking differently: No sig reported 10/08/19   Croitoru, Mihai, MD    Allergies    Morphine  Review of Systems   Review of Systems  Constitutional: Negative for chills and fever.  Respiratory: Negative for shortness of breath.   Cardiovascular: Negative for chest pain.  Gastrointestinal: Negative for abdominal pain and vomiting.  Musculoskeletal: Positive for myalgias.  Skin: Positive for color change.  Neurological: Negative for syncope.  All other systems reviewed and are negative.   Physical Exam Updated Vital Signs BP (!) 121/92 (BP Location: Left Arm)   Pulse (!) 57   Temp 97.6 F (36.4 C) (Oral)   Resp 18   Ht 5\' 8"  (1.727 m)   Wt (!) 170.1 kg   SpO2 98%   BMI 57.02 kg/m   Physical Exam Vitals and nursing note reviewed.  Constitutional:      General: He is not in acute distress.    Appearance: He is well-developed. He is not toxic-appearing.  HENT:     Head: Normocephalic and atraumatic.  Eyes:     General:        Right eye: No discharge.        Left eye: No discharge.     Conjunctiva/sclera: Conjunctivae normal.  Cardiovascular:     Rate and Rhythm: Normal rate and regular rhythm.     Comments: 2+  Pulmonary:     Effort: Pulmonary effort is normal. No respiratory  distress.  Breath sounds: Normal breath sounds. No wheezing, rhonchi or rales.  Abdominal:     General: There is no distension.     Palpations: Abdomen is soft.     Tenderness: There is no abdominal tenderness.  Musculoskeletal:     Cervical back: Neck supple.     Comments: LLE: AKA- no erythema/warmth/drainage.  RLE: 2+ pitting edema to the right lower leg. Erythema & warmth to the lower leg and dorsal foot more so anteriorly extending to the medial proximal lower leg and mildly to the medial distal thigh. There are some raised areas of skin. Pictured below. Some findings of yeast to the posterior knee. Well healed scar to anterior right knee. Patient has intact AROM throughout the hip, knee, ankle, and all digits of the RLE. No vesicles. No pustules. Mild right lower leg tenderness. Otherwise nontender. Compartments are soft.   Skin:    General: Skin is warm and dry.     Findings: No rash.  Neurological:     Mental Status: He is alert.     Comments: Clear speech.   Psychiatric:        Behavior: Behavior normal.          ED Results / Procedures / Treatments   Labs (all labs ordered are listed, but only abnormal results are displayed) Labs Reviewed - No data to display  EKG None  Radiology No results found.  Procedures Procedures   Medications Ordered in ED Medications - No data to display  ED Course  I have reviewed the triage vital signs and the nursing notes.  Pertinent labs & imaging results that were available during my care of the patient were reviewed by me and considered in my medical decision making (see chart for details).    MDM Rules/Calculators/A&P                         Patient presents to the ED with complaints of right lower leg erythema.  Nontoxic, vitals without significant abnormality.   Additional history obtained:  Additional history obtained from chart review & nursing note review.   ED Course:  Pitting edema present, patient states  similar to prior, compliant with xarelto, low suspicion for DVT. 2+ pulses in the foot, good cap refill, well perfused, doubt arterial clot. No significant open wounds. Good ROM throughout all joints- doubt septic joint. Posterior knee with some candida findings- will give topical antifungal and lower leg with findings of cellulitis- will start doxycyline. PCP follow up. I discussed  treatment plan, need for follow-up, and return precautions with the patient. Provided opportunity for questions, patient confirmed understanding and is in agreement with plan.   Findings and plan of care discussed with supervising physician Dr. Tyrone Nine who has evaluated patient, provided guidance, & is in agreement.   Portions of this note were generated with [employer]. Dictation errors may occur despite best attempts at proofreading.  Final Clinical Impression(s) / ED Diagnoses Final diagnoses:  Cellulitis of right leg    Rx / DC Orders ED Discharge Orders         Ordered    doxycycline (VIBRAMYCIN) 100 MG capsule  2 times daily        04/24/20 2241    clotrimazole (LOTRIMIN) 1 % cream        04/24/20 2241           Vantasia Pinkney, [place] R, PA-C 04/24/20 2242    Deno Etienne, DO 04/24/20 2243

## 2020-05-27 ENCOUNTER — Emergency Department (HOSPITAL_COMMUNITY)
Admission: EM | Admit: 2020-05-27 | Discharge: 2020-05-27 | Disposition: A | Discharge status: Home / Self Care | Payer: Medicare Other | Attending: Emergency Medicine | Admitting: Emergency Medicine

## 2020-05-27 ENCOUNTER — Encounter (HOSPITAL_COMMUNITY): Payer: Self-pay | Admitting: Emergency Medicine

## 2020-05-27 DIAGNOSIS — Z79899 Other long term (current) drug therapy: Secondary | ICD-10-CM | POA: Insufficient documentation

## 2020-05-27 DIAGNOSIS — Z8679 Personal history of other diseases of the circulatory system: Secondary | ICD-10-CM | POA: Insufficient documentation

## 2020-05-27 DIAGNOSIS — E039 Hypothyroidism, unspecified: Secondary | ICD-10-CM | POA: Insufficient documentation

## 2020-05-27 DIAGNOSIS — I5032 Chronic diastolic (congestive) heart failure: Secondary | ICD-10-CM | POA: Insufficient documentation

## 2020-05-27 DIAGNOSIS — Z7901 Long term (current) use of anticoagulants: Secondary | ICD-10-CM | POA: Insufficient documentation

## 2020-05-27 DIAGNOSIS — Z96652 Presence of left artificial knee joint: Secondary | ICD-10-CM | POA: Insufficient documentation

## 2020-05-27 DIAGNOSIS — I872 Venous insufficiency (chronic) (peripheral): Secondary | ICD-10-CM | POA: Diagnosis not present

## 2020-05-27 DIAGNOSIS — Z85038 Personal history of other malignant neoplasm of large intestine: Secondary | ICD-10-CM | POA: Insufficient documentation

## 2020-05-27 DIAGNOSIS — R2241 Localized swelling, mass and lump, right lower limb: Secondary | ICD-10-CM | POA: Diagnosis present

## 2020-05-27 MED ORDER — CEPHALEXIN 500 MG PO CAPS: 500.0000 mg | ORAL_CAPSULE | Freq: Four times a day (QID) | ORAL | 0 refills | Status: DC

## 2020-05-27 NOTE — Discharge Instructions (Signed)
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 Take the antibiotics that are prescribed, but see your primary care doctor or the wound care clinic for optimal management. Below is some tip on managing:  "Gentle skin cleansing and frequent use of bland emollients are indicated for the symptomatic treatment of skin dryness and pruritus associated with stasis dermatitis. Patients should gently wash their legs daily using mild nonsoap (synthetic) liquid cleansers to remove scale, bacteria, and crusts. Petrolium gel, fragnence free is preferred moisturizer."

## 2020-05-27 NOTE — ED Provider Notes (Signed)
[facility] DEPT Provider Note   CSN: [id number] Arrival date & time: 05/27/20  4580     History Chief Complaint  Patient presents with  . Leg Swelling  . Leg Pain    [name] is a 70 y.o. male.  HPI    70 year old male comes in a chief complaint of leg swelling and pain.  He has history of CHF, A. 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  He is complaining of right leg swelling and pain.  Patient has been treated as cellulitis.  He is currently wearing compression stockings because of increased swelling.  Patient is taking Xarelto for A. fib.  He has never had DVT before. Review of system is negative for any fevers or chills.  Past Medical History:  Diagnosis Date  . CHF (congestive heart failure) ([place]) 05/2019  . Chronic anticoagulation    with Xarelto  . Complication of anesthesia    PT STATES HARD TO WAKE UP AFTER ONE SUGERY -STATES THE SURGERY TOOK LONGER THAN EXPECTED.  NO PROBLEMS WITH ANY OTHER SURGERY  . Complication of anesthesia    in 12/2018-states started  shaking after surgery, could not get warm   . Dysrhythmia    A-fib  . GERD (gastroesophageal reflux disease)   . History of blood clots   . History of blood transfusion   . Hypothyroidism   . Pain    BACK PAIN - PT ATTRIBUTES TO THE WAY HE WALKS DUE TO LEFT KNEE PROBLEM  . Persistent atrial fibrillation ([place])   . Septic arthritis of knee ([facility])    LEFT KNEE  . Shortness of breath    WITH EXERTION AND PAIN  . Sleep apnea    uses CPAP,    Patient Active Problem List   Diagnosis Date Noted  . Infection of prosthetic left knee joint ([place]) 11/08/2019  . Septic joint of left knee joint ([place]) 11/06/2019  . 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[place]) 08/15/2019  . Open knee wound 08/14/2019  . S/P left TK revision 04/03/2019  . Administration of long-term prophylactic antibiotics   . History of streptococcal infection   . History of DVT (deep  vein thrombosis)   . S/P rev left TK 12/15/2015  . 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 Preoperative cardiovascular examination 04/17/2013  . OSA on CPAP 11/27/2012  . Chronic diastolic heart failure ([place]) 09/10/2012  . Chronic anticoagulation, with Xarelto 09/10/2012  . DVT (deep venous thrombosis), possible 09/10/2012  . SOB (shortness of breath) 08/26/2012  . Chest discomfort 08/26/2012  . Persistent atrial fibrillation ([place]) 08/26/2012  . Super obesity 06/27/2012  . Expected blood loss anemia 06/27/2012  . S/P left TK revision 06/23/2012  . Septic arthritis of knee ([place])   . Cellulitis 09/19/2010  . METHICILLIN RESISTANT STAPHYLOCOCCUS AUREUS INFECTION 09/10/2009  . 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 DM 07/04/2008  . CHRONIC KIDNEY DISEASE UNSPECIFIED 07/04/2008  . PULMONARY EMBOLISM, HX OF 07/04/2008  . INFECTION DUE TO INTERNAL ORTH DEVICE NEC 03/02/2006    Past Surgical History:  Procedure Laterality Date  . 2010 REMOVAL OF LEFT TOTAL KNEE    . AMPUTATION Left 11/17/2019   Procedure: AMPUTATION ABOVE KNEE;  Surgeon: Newt Minion, MD;  Location: [facility];  Service: Orthopedics;  Laterality: Left;  . CARDIAC CATHETERIZATION  2006  . CARDIOVERSION N/A 08/28/2012   Procedure: CARDIOVERSION;  Surgeon: Sanda Klein, MD;  Location: [facility];  Service: Cardiovascular;  Laterality: N/A;  . CARDIOVERSION N/A 09/01/2012   Procedure: CARDIOVERSION;  Surgeon: Pixie Casino, MD;  Location: [facility];  Service: Cardiovascular;  Laterality: N/A;  . COLONOSCOPY N/A 06/13/2013   Procedure: COLONOSCOPY;  Surgeon: Inda Castle, MD;  Location: [facility];  Service: Endoscopy;  Laterality: N/A;  . EXCISIONAL TOTAL KNEE ARTHROPLASTY WITH ANTIBIOTIC SPACERS Left 09/21/2018   Procedure: Resection of tibia versus both components with placement of antibiotic spacer;  Surgeon: Paralee Cancel, MD;  Location: [facility];  Service: Orthopedics;  Laterality: Left;  2.5 hrs  .  EXCISIONAL TOTAL KNEE ARTHROPLASTY WITH ANTIBIOTIC SPACERS Left 01/02/2019   Procedure: Repeat Washout and placement of antibiotic spacer left knee;  Surgeon: Paralee Cancel, MD;  Location: [facility];  Service: Orthopedics;  Laterality: Left;  2 hrs  . HYDROCELECTOY   2012  . I & D EXTREMITY Left 08/14/2019   Procedure: IRRIGATION AND DEBRIDEMENT LEFT KNEE WOUND;  Surgeon: Paralee Cancel, MD;  Location: [facility];  Service: Orthopedics;  Laterality: Left;  60 mins  . I & D KNEE WITH POLY EXCHANGE Left 05/17/2019   Procedure: IRRIGATION AND DEBRIDEMENT KNEE WITH POLY EXCHANGE;  Surgeon: Paralee Cancel, MD;  Location: [facility];  Service: Orthopedics;  Laterality: Left;  90 mins  . IRRIGATION AND DEBRIDEMENT KNEE Left 04/03/2019   Procedure: REIMPLANTATION OF LEFT KNEE TIBIAL COMPONENT.;  Surgeon: Paralee Cancel, MD;  Location: [facility];  Service: Orthopedics;  Laterality: Left;  need 120 min  . IRRIGATION AND DEBRIDEMENT KNEE Left 11/06/2019   Procedure: IRRIGATION AND DEBRIDEMENT KNEE;  Surgeon: Paralee Cancel, MD;  Location: [facility];  Service: Orthopedics;  Laterality: Left;  90 mins  . LEFT KNEE ARTHROSCOPY  1999  . LEFT KNEE SURGERY UPPER TIBIAL OSTOMY    . LEFT TOTAL KNEE REMOVAL FOR INFECTION  2006  . REIMPLANTATION LEFT TOTAL KNEE  2008  . REIMPLANTATION LEFT TOTAL KNEE   2006  . REIMPLANTATION OF TOTAL KNEE Left 06/23/2012   Procedure: REIMPLANTATION OF LEFT TOTAL KNEE;  Surgeon: Mauri Pole, MD;  Location: [facility];  Service: Orthopedics;  Laterality: Left;  . REMOVAL LEFT TOTAL KNEE   2008  . REPLACEMENT LEFT KNEE  2002  . REPLACEMENT RIGHT KNEE  2003  . REVISION LEFT KNEE CAP   2004  . RIGHT KNEE ARTHROSCOPY  1998  . TEE WITHOUT CARDIOVERSION N/A 08/28/2012   Procedure: TRANSESOPHAGEAL ECHOCARDIOGRAM (TEE);  Surgeon: Sanda Klein, MD;  Location: [facility];  Service: Cardiovascular;  Laterality: N/A;  . TONSILLECTOMY    . TOTAL KNEE REVISION Left 12/15/2015   Procedure: TOTAL KNEE REVISION REPLACEMENT;   Surgeon: Paralee Cancel, MD;  Location: [facility];  Service: Orthopedics;  Laterality: Left;  Adductor Block       Family History  Problem Relation Age of Onset  . Heart disease Father 28  . Pancreatic cancer Sister   . Liver cancer Sister   . Cervical cancer Sister   . Breast cancer Sister   . Diabetes Sister   . Colon cancer Neg Hx   . Throat cancer Neg Hx   . Stomach cancer Neg Hx   . Kidney disease Neg Hx   . Liver disease Neg Hx     Social History   Tobacco Use  . Smoking status: Never Smoker  . Smokeless tobacco: Never Used  Vaping Use  . Vaping Use: Never used  Substance Use Topics  . Alcohol use: Not Currently    Comment: not since  1999  . Drug use: No    Home Medications Prior to Admission medications   Medication Sig Start Date End Date Taking? Authorizing Provider  cephALEXin (KEFLEX) 500 MG capsule Take 1 capsule (500 mg total) by mouth 4 (four) times daily. 05/27/20   Varney Biles, MD  clotrimazole (LOTRIMIN) 1 % cream Apply to affected area (back of the knee) 2 times daily 04/24/20   Petrucelli, Samantha R, PA-C  diltiazem (CARDIZEM CD) 240 MG 24 hr capsule TAKE 1 CAPSULE BY MOUTH DAILY Patient taking differently: Take 240 mg by mouth daily.  10/10/18   Croitoru, Mihai, MD  doxycycline (VIBRAMYCIN) 100 MG capsule Take 1 capsule (100 mg total) by mouth 2 (two) times daily. 04/24/20   Petrucelli, Samantha R, PA-C  ferrous sulfate (FERROUSUL) 325 (65 FE) MG tablet Take 1 tablet (325 mg total) by mouth 3 (three) times daily with meals for 14 days. Patient taking differently: Take 325 mg by mouth daily.  08/16/19 10/30/20  Danae Orleans, PA-C  furosemide (LASIX) 20 MG tablet Take 20 mg by mouth daily as needed (fluid retention.).  08/29/19   [provider]  levothyroxine (SYNTHROID) 125 MCG tablet Take 125 mcg by mouth daily before breakfast.  10/31/15   [provider]  methocarbamol (ROBAXIN) 500 MG tablet Take 1 tablet (500 mg total) by mouth every 6  (six) hours as needed for muscle spasms. 08/16/19   Danae Orleans, PA-C  metoprolol tartrate (LOPRESSOR) 50 MG tablet TAKE 1 TABLET BY MOUTH TWICE DAILY Patient taking differently: Take 50 mg by mouth 2 (two) times daily.  12/12/18   Croitoru, Mihai, MD  Multiple Vitamin (MULTIVITAMIN WITH MINERALS) TABS Take 1 tablet by mouth daily.    [provider]  naphazoline-glycerin (CLEAR EYES REDNESS) 0.012-0.2 % SOLN Place 1-2 drops into both eyes 4 (four) times daily as needed for eye irritation. 11/21/19   Persons, Bevely Palmer, PA  oxyCODONE (OXY IR/ROXICODONE) 5 MG immediate release tablet Take 1-2 tablets (5-10 mg total) by mouth every 4 (four) hours as needed for moderate pain (pain score 4-6). 11/20/19   Persons, Bevely Palmer, PA  XARELTO 20 MG TABS tablet TAKE 1 TABLET BY MOUTH DAILY WITH BREAKFAST Patient taking differently: No sig reported 10/08/19   Croitoru, Mihai, MD    Allergies    Morphine  Review of Systems   Review of Systems  Constitutional: Positive for activity change.  Respiratory: Negative for shortness of breath.   Cardiovascular: Negative for chest pain.  Gastrointestinal: Negative for nausea and vomiting.  Musculoskeletal: Positive for arthralgias and myalgias.  Skin: Positive for rash.    Physical Exam Updated Vital Signs BP 98/74   Pulse (!) 55   Temp 97.6 F (36.4 C) (Oral)   Resp 16   SpO2 99%   Physical Exam Vitals and nursing note reviewed.  Constitutional:      Appearance: He is well-developed.  HENT:     Head: Atraumatic.  Cardiovascular:     Rate and Rhythm: Normal rate.  Pulmonary:     Effort: Pulmonary effort is normal.  Musculoskeletal:        General: Swelling and tenderness present.     Cervical back: Neck supple.     Comments: Pt has erythema with mild edema. Minimal warmth to touch  Skin:    General: Skin is warm.     Findings: Rash present.  Neurological:     Mental Status: He is alert and oriented to person, place, and time.  ED Results / Procedures / Treatments   Labs (all labs ordered are listed, but only abnormal results are displayed) Labs Reviewed - No data to display  EKG None  Radiology No results found.  Procedures Procedures   Medications Ordered in ED Medications - No data to display  ED Course  I have reviewed the triage vital signs and the nursing notes.  Pertinent labs & imaging results that were available during my care of the patient were reviewed by me and considered in my medical decision making (see chart for details).    MDM Rules/Calculators/A&P                          Pt comes in with cc of leg swelling and pain.  He was recently treated for cellulitis.  He has left BKA.  Patient has history of CHF and wears compression stockings.  On exam, there is erythema and mild warmth to touch.  There is also pitting edema that is 1+ in nature.  Patient is on Xarelto for A. fib.  DVT concerns lower secondary to him being on anticoagulation.  Initial clinical impression is that patient likely has stasis dermatitis.  We will initiate conservative measurements.  Patient has been provided with wound care follow-up so that he can get optimal management of his stasis dermatitis before it becomes more complicated.  Advised patient to see PCP and wound care.  They can get the DVT study if he is not improving with simple stasis dermatitis and cellulitis treatment.  Final Clinical Impression(s) / ED Diagnoses Final diagnoses:  Venous stasis dermatitis of right lower extremity    Rx / DC Orders ED Discharge Orders         Ordered    cephALEXin (KEFLEX) 500 MG capsule  4 times daily,   Status:  Discontinued        05/27/20 1052    cephALEXin (KEFLEX) 500 MG capsule  4 times daily        05/27/20 1055           Varney Biles, MD 05/27/20 1124

## 2020-05-27 NOTE — ED Triage Notes (Signed)
Patient here from [facility] assisted living reporting right leg swelling and oozing started 1 week ago. Report hx of same last month, given antibiotics with no relief.

## 2020-06-05 ENCOUNTER — Encounter: Payer: Medicare Other | Attending: Internal Medicine | Admitting: Internal Medicine

## 2020-06-05 ENCOUNTER — Other Ambulatory Visit: Payer: Self-pay

## 2020-06-05 DIAGNOSIS — I872 Venous insufficiency (chronic) (peripheral): Secondary | ICD-10-CM | POA: Diagnosis not present

## 2020-06-05 DIAGNOSIS — L97818 Non-pressure chronic ulcer of other part of right lower leg with other specified severity: Secondary | ICD-10-CM | POA: Insufficient documentation

## 2020-06-05 DIAGNOSIS — I509 Heart failure, unspecified: Secondary | ICD-10-CM | POA: Insufficient documentation

## 2020-06-05 DIAGNOSIS — Z8614 Personal history of Methicillin resistant Staphylococcus aureus infection: Secondary | ICD-10-CM | POA: Diagnosis not present

## 2020-06-05 DIAGNOSIS — Z7901 Long term (current) use of anticoagulants: Secondary | ICD-10-CM | POA: Diagnosis not present

## 2020-06-05 DIAGNOSIS — I890 Lymphedema, not elsewhere classified: Secondary | ICD-10-CM | POA: Diagnosis not present

## 2020-06-05 DIAGNOSIS — Z885 Allergy status to narcotic agent status: Secondary | ICD-10-CM | POA: Insufficient documentation

## 2020-06-05 DIAGNOSIS — Z993 Dependence on wheelchair: Secondary | ICD-10-CM | POA: Diagnosis not present

## 2020-06-05 DIAGNOSIS — Z86718 Personal history of other venous thrombosis and embolism: Secondary | ICD-10-CM | POA: Diagnosis not present

## 2020-06-05 DIAGNOSIS — I110 Hypertensive heart disease with heart failure: Secondary | ICD-10-CM | POA: Diagnosis not present

## 2020-06-05 NOTE — Progress Notes (Signed)
HERSH, MINNEY ([id number]) Visit Report for 06/05/2020 Allergy List Details Patient Name: [name], [name]. Date of Service: 06/05/2020 10:00 AM Medical Record Number: [id number] Patient Account Number: [id number] Date of Birth/Sex: [birth year] (70 y.o. M) Treating RN: Donnamarie Poag Primary Care Briggett Tuccillo: Bernerd Limbo Other Clinician: Jeanine Luz Referring Velora Horstman: Varney Biles Treating Adoria Kawamoto/Extender: Ricard Dillon Weeks in Treatment: 0 Allergies Active Allergies morphine Reaction: rash Severity: Moderate Allergy Notes Electronic Signature(s) Signed: 06/05/2020 2:09:49 PM By: Donnamarie Poag Entered By: Donnamarie Poag on 06/05/2020 10:24:30 [name] ([id number]) -------------------------------------------------------------------------------- Arrival Information Details Patient Name: [name], [name]. Date of Service: 06/05/2020 10:00 AM Medical Record Number: [id number] Patient Account Number: [id number] Date of Birth/Sex: 1950-02-27 (70 y.o. M) Treating RN: Donnamarie Poag Primary Care Ahilyn Nell: Bernerd Limbo Other Clinician: Jeanine Luz Referring Mussa Groesbeck: Varney Biles Treating Jiaire Rosebrook/Extender: Tito Dine in Treatment: 0 Visit Information Patient Arrived: Wheel Chair Arrival Time: 09:59 Accompanied By: self Transfer Assistance: None Patient Identification Verified: Yes Secondary Verification Process Completed: Yes Patient Has Alerts: Yes Patient Alerts: Patient on Blood Thinner NOT diabetic [place] [facility] ABI non compressible Rleg Left AKA Electronic Signature(s) Signed: 06/05/2020 11:01:34 AM By: Donnamarie Poag Previous Signature: 06/05/2020 11:01:06 AM Version By: Donnamarie Poag Entered By: Donnamarie Poag on 06/05/2020 11:01:34 Caruthers, Guy Sandifer ([id number]) -------------------------------------------------------------------------------- Clinic Level of Care Assessment Details Patient Name: [name], [name]. Date of Service: 06/05/2020  10:00 AM Medical Record Number: [id number] Patient Account Number: [id number] Date of Birth/Sex: 21-Jul-1950 (70 y.o. M) Treating RN: Dolan Amen Primary Care Shaneta Cervenka: Bernerd Limbo Other Clinician: Jeanine Luz Referring Kiante Ciavarella: Varney Biles Treating Andres Vest/Extender: Tito Dine in Treatment: 0 Clinic Level of Care Assessment Items TOOL 1 Quantity Score X - Use when EandM and Procedure is performed on INITIAL visit 1 0 ASSESSMENTS - Nursing Assessment / Reassessment X - General Physical Exam (combine w/ comprehensive assessment (listed just below) when performed on new 1 20 pt. evals) X- 1 25 Comprehensive Assessment (HX, ROS, Risk Assessments, Wounds Hx, etc.) ASSESSMENTS - Wound and Skin Assessment / Reassessment X - Dermatologic / Skin Assessment (not related to wound area) 1 10 ASSESSMENTS - Ostomy and/or Continence Assessment and Care []  - Incontinence Assessment and Management 0 []  - 0 Ostomy Care Assessment and Management (repouching, etc.) PROCESS - Coordination of Care X - Simple Patient / Family Education for ongoing care 1 15 []  - 0 Complex (extensive) Patient / Family Education for ongoing care X- 1 10 Staff obtains Programmer, systems, Records, Test Results / Process Orders []  - 0 Staff telephones HHA, Nursing Homes / Clarify orders / etc []  - 0 Routine Transfer to another Facility (non-emergent condition) []  - 0 Routine Hospital Admission (non-emergent condition) []  - 0 New Admissions / Biomedical engineer / Ordering NPWT, Apligraf, etc. []  - 0 Emergency Hospital Admission (emergent condition) PROCESS - Special Needs []  - Pediatric / Minor Patient Management 0 []  - 0 Isolation Patient Management []  - 0 Hearing / Language / Visual special needs []  - 0 Assessment of Community assistance (transportation, D/C planning, etc.) []  - 0 Additional assistance / Altered mentation []  - 0 Support Surface(s) Assessment (bed, cushion, seat,  etc.) INTERVENTIONS - Miscellaneous []  - External ear exam 0 []  - 0 Patient Transfer (multiple staff / Civil Service fast streamer / Similar devices) []  - 0 Simple Staple / Suture removal (25 or less) []  - 0 Complex Staple / Suture removal (26 or more) []  - 0 Hypo/Hyperglycemic Management (do not check if billed separately) X-  1 15 Ankle / Brachial Index (ABI) - do not check if billed separately Has the patient been seen at the hospital within the last three years: Yes Total Score: 95 Level Of Care: New/Established - Level 3 HAO, PARRA ([id number]) Electronic Signature(s) Signed: 06/05/2020 5:05:31 PM By: Georges Mouse, Minus Breeding RN Entered By: Georges Mouse, Minus Breeding on 06/05/2020 11:05:14 JERMONTE, VANDENBROEK ([id number]) -------------------------------------------------------------------------------- Compression Therapy Details Patient Name: [name], [name]. Date of Service: 06/05/2020 10:00 AM Medical Record Number: [id number] Patient Account Number: [id number] Date of Birth/Sex: [birth year] (70 y.o. M) Treating RN: Dolan Amen Primary Care Jermond Burkemper: Bernerd Limbo Other Clinician: Jeanine Luz Referring Irish Piech: Varney Biles Treating Tuleen Mandelbaum/Extender: Tito Dine in Treatment: 0 Compression Therapy Performed for Wound Assessment: Wound #1 Right,Distal,Posterior Lower Leg Performed By: Cora Daniels, RN Compression Type: Three Layer Post Procedure Diagnosis Same as Pre-procedure Electronic Signature(s) Signed: 06/05/2020 5:05:31 PM By: Georges Mouse, Minus Breeding RN Entered By: Georges Mouse, Minus Breeding on 06/05/2020 11:03:02 JOURDAIN, VIELMAN ([id number]) -------------------------------------------------------------------------------- Compression Therapy Details Patient Name: [name], [name]. Date of Service: 06/05/2020 10:00 AM Medical Record Number: [id number] Patient Account Number: [id number] Date of Birth/Sex: 10-07-1950 (70 y.o. M) Treating RN: Dolan Amen Primary  Care Kaylan Yates: Bernerd Limbo Other Clinician: Jeanine Luz Referring Shermon Bozzi: Varney Biles Treating Ranbir Chew/Extender: Tito Dine in Treatment: 0 Compression Therapy Performed for Wound Assessment: Wound #2 Right,Midline,Anterior Lower Leg Performed By: Clinician Dolan Amen, RN Compression Type: Three Layer Post Procedure Diagnosis Same as Pre-procedure Electronic Signature(s) Signed: 06/05/2020 5:05:31 PM By: Georges Mouse, Minus Breeding RN Entered By: Georges Mouse, Minus Breeding on 06/05/2020 11:03:02 COBI, TAFEL ([id number]) -------------------------------------------------------------------------------- Lower Extremity Assessment Details Patient Name: [name], [name]. Date of Service: 06/05/2020 10:00 AM Medical Record Number: [id number] Patient Account Number: [id number] Date of Birth/Sex: 1950-09-29 (70 y.o. M) Treating RN: Donnamarie Poag Primary Care Marcianna Daily: Bernerd Limbo Other Clinician: Jeanine Luz Referring Angelus Hoopes: Varney Biles Treating Elmus Mathes/Extender: Tito Dine in Treatment: 0 Edema Assessment Assessed: [Left: No] [Right: Yes] Edema: [Left: Ye] [Right: s] Calf Left: Right: Point of Measurement: 34.5 cm From Medial Instep 52 cm Ankle Left: Right: Point of Measurement: 18 cm From Medial Instep 27.5 cm Knee To Floor Left: Right: From Medial Instep 44 cm Vascular Assessment Pulses: Dorsalis Pedis Doppler Audible: [Right:Yes] Posterior Tibial Doppler Audible: [Right:Yes] Notes NON COMPRESSIBLE >220 RIGHT LEG Electronic Signature(s) Signed: 06/05/2020 2:09:49 PM By: Donnamarie Poag Entered By: Donnamarie Poag on 06/05/2020 10:24:11 Ephraim, Guy Sandifer ([id number]) -------------------------------------------------------------------------------- Multi Wound Chart Details Patient Name: [name], [name]. Date of Service: 06/05/2020 10:00 AM Medical Record Number: [id number] Patient Account Number: [id number] Date of Birth/Sex: [birth year] (70 y.o.  M) Treating RN: Dolan Amen Primary Care Kashena Novitski: Bernerd Limbo Other Clinician: Jeanine Luz Referring Kaysee Hergert: Varney Biles Treating Suheily Birks/Extender: Tito Dine in Treatment: 0 Vital Signs Height(in): 68 Pulse(bpm): 59 Weight(lbs): 365 Blood Pressure(mmHg): 124/78 Body Mass Index(BMI): 55 Temperature(F): 97.9 Respiratory Rate(breaths/min): 20 Photos: [N/A:N/A] Wound Location: Right Lower Leg Right, Midline, Anterior Lower Leg N/A Wounding Event: Shear/Friction Shear/Friction N/A Primary Etiology: Lymphedema Lymphedema N/A Date Acquired: 04/08/2020 04/08/2020 N/A Weeks of Treatment: 0 0 N/A Wound Status: Open Open N/A Clustered Wound: No Yes N/A Measurements L x W x D (cm) 4.5x7x0.1 6x9x0.1 N/A Area (cm) : 24.74 42.412 N/A Volume (cm) : 2.474 4.241 N/A Classification: Full Thickness Without Exposed Full Thickness Without Exposed N/A Support Structures Support Structures Exudate Amount: Medium Medium N/A Exudate Type: Serosanguineous Serosanguineous N/A Exudate Color: red, brown red, brown N/A Granulation Amount: Large (  67-100%) Large (67-100%) N/A Granulation Quality: Red, Pink Red, Pink N/A Necrotic Amount: Small (1-33%) Small (1-33%) N/A Exposed Structures: Fat Layer (Subcutaneous Tissue): Fat Layer (Subcutaneous Tissue): N/A Yes Yes Fascia: No Fascia: No Tendon: No Tendon: No Muscle: No Muscle: No Joint: No Joint: No Bone: No Bone: No Procedures Performed: Compression Therapy Compression Therapy N/A Treatment Notes Electronic Signature(s) Signed: 06/05/2020 5:07:17 PM By: Linton Ham MD Entered By: Linton Ham on 06/05/2020 11:16:04 Lowndes, Guy Sandifer ([id number]) -------------------------------------------------------------------------------- [place] Details Patient Name: [name], JUBB. Date of Service: 06/05/2020 10:00 AM Medical Record Number: [id number] Patient Account Number: [id number] Date of  Birth/Sex: 1950/11/04 (70 y.o. M) Treating RN: Dolan Amen Primary Care Jullie Arps: Bernerd Limbo Other Clinician: Jeanine Luz Referring Toshiye Kever: Varney Biles Treating Montrez Marietta/Extender: Tito Dine in Treatment: 0 Active Inactive Orientation to the Wound Care Program Nursing Diagnoses: Knowledge deficit related to the wound healing center program Goals: Patient/caregiver will verbalize understanding of the [place] Program Date Initiated: 06/05/2020 Target Resolution Date: 06/05/2020 Goal Status: Active Interventions: Provide education on orientation to the wound center Notes: Wound/Skin Impairment Nursing Diagnoses: Impaired tissue integrity Goals: Patient/caregiver will verbalize understanding of skin care regimen Date Initiated: 06/05/2020 Target Resolution Date: 06/05/2020 Goal Status: Active Ulcer/skin breakdown will have a volume reduction of 30% by week 4 Date Initiated: 06/05/2020 Target Resolution Date: 07/05/2020 Goal Status: Active Ulcer/skin breakdown will have a volume reduction of 50% by week 8 Date Initiated: 06/05/2020 Target Resolution Date: 08/05/2020 Goal Status: Active Ulcer/skin breakdown will have a volume reduction of 80% by week 12 Date Initiated: 06/05/2020 Target Resolution Date: 09/04/2020 Goal Status: Active Ulcer/skin breakdown will heal within 14 weeks Date Initiated: 06/05/2020 Target Resolution Date: 10/05/2020 Goal Status: Active Interventions: Assess patient/caregiver ability to obtain necessary supplies Assess patient/caregiver ability to perform ulcer/skin care regimen upon admission and as needed Assess ulceration(s) every visit Provide education on ulcer and skin care Treatment Activities: Referred to DME Carroll Ranney for dressing supplies : 06/05/2020 Skin care regimen initiated : 06/05/2020 Notes: Electronic Signature(s) Signed: 06/05/2020 5:05:31 PM By: Georges Mouse, Minus Breeding RN Entered By: Georges Mouse,  Minus Breeding on 06/05/2020 11:00:40 KALVYN, DESA ([id number], JUNGWIRTH ([id number]) -------------------------------------------------------------------------------- Pain Assessment Details Patient Name: [name], [name]. Date of Service: 06/05/2020 10:00 AM Medical Record Number: [id number] Patient Account Number: [id number] Date of Birth/Sex: [birth year] (70 y.o. M) Treating RN: Donnamarie Poag Primary Care Zamirah Denny: Bernerd Limbo Other Clinician: Jeanine Luz Referring Izabellah Dadisman: Varney Biles Treating Adrie Picking/Extender: Tito Dine in Treatment: 0 Active Problems Location of Pain Severity and Description of Pain Patient Has Paino Yes Site Locations Pain Location: Pain in Ulcers Rate the pain. Current Pain Level: 6 Pain Management and Medication Current Pain Management: Electronic Signature(s) Signed: 06/05/2020 2:09:49 PM By: Donnamarie Poag Entered By: Donnamarie Poag on 06/05/2020 10:02:06 [name] ([id number]) -------------------------------------------------------------------------------- Patient/Caregiver Education Details Patient Name: [name], [name]. Date of Service: 06/05/2020 10:00 AM Medical Record Number: [id number] Patient Account Number: [id number] Date of Birth/Gender: 1950-04-24 (70 y.o. M) Treating RN: Dolan Amen Primary Care Physician: Bernerd Limbo Other Clinician: Jeanine Luz Referring Physician: Varney Biles Treating Physician/Extender: Tito Dine in Treatment: 0 Education Assessment Education Provided To: Patient Education Topics Provided Welcome To [facility] [place]: Methods: Explain/Verbal Responses: State content correctly Wound/Skin Impairment: Methods: Explain/Verbal Responses: State content correctly Electronic Signature(s) Signed: 06/05/2020 5:05:31 PM By: Georges Mouse, Minus Breeding RN Entered By: Georges Mouse, Minus Breeding on 06/05/2020 11:06:38 [name], [name]  ([id number]) -------------------------------------------------------------------------------- Wound Assessment Details  Patient Name: [name], [name]. Date of Service: 06/05/2020 10:00 AM Medical Record Number: [id number] Patient Account Number: [id number] Date of Birth/Sex: [birth year] (70 y.o. M) Treating RN: Donnamarie Poag Primary Care Clotee Schlicker: Bernerd Limbo Other Clinician: Jeanine Luz Referring Virgle Arth: Varney Biles Treating Esteban Kobashigawa/Extender: Tito Dine in Treatment: 0 Wound Status Wound Number: 1 Primary Etiology: Lymphedema Wound Location: Right Lower Leg Wound Status: Open Wounding Event: Shear/Friction Date Acquired: 04/08/2020 Weeks Of Treatment: 0 Clustered Wound: No Photos Wound Measurements Length: (cm) 4.5 Width: (cm) 7 Depth: (cm) 0.1 Area: (cm) 24.74 Volume: (cm) 2.474 % Reduction in Area: % Reduction in Volume: Tunneling: No Undermining: No Wound Description Classification: Full Thickness Without Exposed Support Structu Exudate Amount: Medium Exudate Type: Serosanguineous Exudate Color: red, brown res Foul Odor After Cleansing: No Slough/Fibrino Yes Wound Bed Granulation Amount: Large (67-100%) Exposed Structure Granulation Quality: Red, Pink Fascia Exposed: No Necrotic Amount: Small (1-33%) Fat Layer (Subcutaneous Tissue) Exposed: Yes Necrotic Quality: Adherent Slough Tendon Exposed: No Muscle Exposed: No Joint Exposed: No Bone Exposed: No Electronic Signature(s) Signed: 06/05/2020 2:09:49 PM By: Donnamarie Poag Entered By: Donnamarie Poag on 06/05/2020 10:14:34 Hilgert, Guy Sandifer ([id number]) -------------------------------------------------------------------------------- Wound Assessment Details Patient Name: [name], LONA. Date of Service: 06/05/2020 10:00 AM Medical Record Number: [id number] Patient Account Number: [id number] Date of Birth/Sex: 08/31/1950 (70 y.o. M) Treating RN: Donnamarie Poag Primary Care Jhordan Mckibben: Bernerd Limbo Other  Clinician: Jeanine Luz Referring Albina Gosney: Varney Biles Treating Litha Lamartina/Extender: Tito Dine in Treatment: 0 Wound Status Wound Number: 2 Primary Etiology: Lymphedema Wound Location: Right, Midline, Anterior Lower Leg Wound Status: Open Wounding Event: Shear/Friction Date Acquired: 04/08/2020 Weeks Of Treatment: 0 Clustered Wound: Yes Photos Wound Measurements Length: (cm) 6 Width: (cm) 9 Depth: (cm) 0.1 Area: (cm) 42.412 Volume: (cm) 4.241 % Reduction in Area: % Reduction in Volume: Tunneling: No Undermining: No Wound Description Classification: Full Thickness Without Exposed Support Structu Exudate Amount: Medium Exudate Type: Serosanguineous Exudate Color: red, brown res Foul Odor After Cleansing: No Slough/Fibrino Yes Wound Bed Granulation Amount: Large (67-100%) Exposed Structure Granulation Quality: Red, Pink Fascia Exposed: No Necrotic Amount: Small (1-33%) Fat Layer (Subcutaneous Tissue) Exposed: Yes Necrotic Quality: Adherent Slough Tendon Exposed: No Muscle Exposed: No Joint Exposed: No Bone Exposed: No Electronic Signature(s) Signed: 06/05/2020 2:09:49 PM By: Donnamarie Poag Entered By: Donnamarie Poag on 06/05/2020 10:16:45 Capers, Guy Sandifer ([id number]) -------------------------------------------------------------------------------- [place] Details Patient Name: JOAHAN, SWATZELL. Date of Service: 06/05/2020 10:00 AM Medical Record Number: [id number] Patient Account Number: [id number] Date of Birth/Sex: 03/26/1950 (70 y.o. M) Treating RN: Donnamarie Poag Primary Care Sherley Leser: Bernerd Limbo Other Clinician: Jeanine Luz Referring Tyreon Frigon: Varney Biles Treating Paco Cislo/Extender: Tito Dine in Treatment: 0 Vital Signs Time Taken: 10:00 Temperature (F): 97.9 Height (in): 68 Pulse (bpm): 77 Source: Stated Respiratory Rate (breaths/min): 20 Weight (lbs): 365 Blood Pressure (mmHg): 124/78 Source: Stated Reference Range:  80 - 120 mg / dl Body Mass Index (BMI): 55.5 Electronic Signature(s) Signed: 06/05/2020 2:09:49 PM By: Donnamarie Poag Entered ByDonnamarie Poag on 06/05/2020 10:02:37

## 2020-06-05 NOTE — Progress Notes (Signed)
BRADY, SCHILLER ([id number]) Visit Report for 06/05/2020 Chief Complaint Document Details Patient Name: [name], [name]. Date of Service: 06/05/2020 10:00 AM Medical Record Number: [id number] Patient Account Number: [id number] Date of Birth/Sex: Jul 27, 1950 (70 y.o. M) Treating RN: Dolan Amen Primary Care Provider: Bernerd Limbo Other Clinician: Jeanine Luz Referring Provider: Varney Biles Treating Provider/Extender: Tito Dine in Treatment: 0 Information Obtained from: Patient Chief Complaint 06/05/2020; patient is here for review of wounds on the right lower leg Electronic Signature(s) Signed: 06/05/2020 5:07:17 PM By: Linton Ham MD Entered By: Linton Ham on 06/05/2020 11:16:27 WENDELIN, BRADT ([id number]) -------------------------------------------------------------------------------- HPI Details Patient Name: [name]. Date of Service: 06/05/2020 10:00 AM Medical Record Number: [id number] Patient Account Number: [id number] Date of Birth/Sex: December 12, 1950 (70 y.o. 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 He is wheelchair dependent but can transfer himself. He was seen in the ER on 04/24/2020 with right leg cellulitis given doxycycline. He was back in the ER on 05/27/2020 with stasis dermatitis however they went ahead and gave him a course of Keflex. He says his wounds started sometime in March where he had wheelchair trauma with another resident. This may have happened twice. He has [facility]  home health where not exactly sure what they are putting on the wound but it is probably collagen. He denies any prior history of pelvic trauma, radiation, malignancy etc. I am not able to really get a history of how long the amount of edema in his right leg is been present. Past medical history includes left above-knee amputation, congestive heart failure, hypothyroidism, history of MRSA, A. fib on Xarelto. Patient claims he is not a diabetic but there is some parts of his assisted living records that suggest he has diabetes although his last hemoglobin A1c that I can see was not indicative. ABIs were noncompressible Electronic Signature(s) Signed: 06/05/2020 5:07:17 PM By: Linton Ham MD Entered By: Linton Ham on 06/05/2020 11:20:32 ABDOUL, ENCINAS ([id number]) -------------------------------------------------------------------------------- Physical Exam Details Patient Name: [name]. Date of Service: 06/05/2020 10:00 AM Medical Record Number: [id number] Patient Account Number: [id number] Date of Birth/Sex: 1950/03/29 (70 y.o. M) Treating RN: Dolan Amen Primary Care Provider: Bernerd Limbo Other Clinician: Jeanine Luz Referring Provider: Varney Biles Treating Provider/Extender: Ricard Dillon Weeks in Treatment: 0 Constitutional Sitting or standing Blood Pressure is within target range for patient.. Pulse regular and within target range for patient.Marland Kitchen Respirations regular, non- labored and within target range.. Temperature is normal and within the target range for the patient.Marland Kitchen appears in no distress. Cardiovascular Pedal pulses are palpable on the right both dorsalis pedis and posterior tibial. Capillary refill time is normal. There is tense nonpitting edema in the right lower leg this extends up into his thigh especially superiorly.. Integumentary (Hair, Skin) Skin changes in the right lower leg of chronic venous insufficiency also some polypoid areas  suggestive of lymphedema skin changes. Psychiatric No evidence of depression, anxiety, or agitation. Calm, cooperative, and communicative. Appropriate interactions and affect.. Notes Wound exam; the patient has several open areas. There are 2 anteriorly. Medially there is a fairly  large area extending medially into the posterior part of the calf. All of these are weepy. Not particularly infected looking. Electronic Signature(s) Signed: 06/05/2020 5:07:17 PM By: Linton Ham MD Entered By: Linton Ham on 06/05/2020 11:23:04 BREYLAN, LEFEVERS ([id number]) -------------------------------------------------------------------------------- Physician Orders Details Patient Name: [name]. Date of Service: 06/05/2020 10:00 AM Medical Record Number: [id number] Patient Account Number: [id number] Date of Birth/Sex: 06/15/1950 (70 y.o. M) Treating RN: Dolan Amen Primary Care Provider: Bernerd Limbo Other Clinician: Jeanine Luz Referring Provider: Varney Biles Treating Provider/Extender: Tito Dine in Treatment: 0 Verbal / Phone Orders: No Diagnosis Coding Follow-up Appointments o Return Appointment in 1 week. [place]: - [facility] for wound care. May utilize formulary equivalent dressing for wound treatment orders unless otherwise specified. Home Health Nurse may visit PRN to address patientos wound care needs. o Scheduled days for dressing changes to be completed; exception, patient has scheduled wound care visit that day. - Monday/Tuesday dressing change-Patient seen in office on Thursdays Edema Control - Lymphedema / Segmental Compressive Device / Other Right Lower Extremity o Optional: One layer of unna paste to top of compression wrap (to act as an anchor). o 3 Layer Compression System for Lymphedema. o Elevate legs to the level of the heart and pump ankles as often as possible o Elevate  leg(s) parallel to the floor when sitting. Wound Treatment Wound #1 - Lower Leg Wound Laterality: Right, Posterior, Distal Cleanser: Soap and Water 2 x Per Week/15 Days Discharge Instructions: Gently cleanse wound with antibacterial soap, rinse and pat dry prior to dressing wounds Peri-Wound Care: Triamcinolone Acetonide Cream, 0.1%, 15 (g) tube 2 x Per Week/15 Days Primary Dressing: Silvercel 4 1/4x 4 1/4 (in/in) 2 x Per Week/15 Days Discharge Instructions: Apply Silvercel 4 1/4x 4 1/4 (in/in) as instructed Secondary Dressing: ABD Pad 5x9 (in/in) 2 x Per Week/15 Days Discharge Instructions: Cover with ABD pad Compression Wrap: Profore Lite LF 3 Multilayer Compression Bandaging System 2 x Per Week/15 Days Discharge Instructions: Apply 3 multi-layer wrap as prescribed. Wound #2 - Lower Leg Wound Laterality: Right, Midline, Anterior Cleanser: Soap and Water 2 x Per Week/15 Days Discharge Instructions: Gently cleanse wound with antibacterial soap, rinse and pat dry prior to dressing wounds Peri-Wound Care: Triamcinolone Acetonide Cream, 0.1%, 15 (g) tube 2 x Per Week/15 Days Primary Dressing: Silvercel 4 1/4x 4 1/4 (in/in) 2 x Per Week/15 Days Discharge Instructions: Apply Silvercel 4 1/4x 4 1/4 (in/in) as instructed Secondary Dressing: ABD Pad 5x9 (in/in) 2 x Per Week/15 Days Discharge Instructions: Cover with ABD pad Compression Wrap: Profore Lite LF 3 Multilayer Compression Bandaging System 2 x Per Week/15 Days Discharge Instructions: Apply 3 multi-layer wrap as prescribed. Electronic Signature(s) Signed: 06/05/2020 5:05:31 PM By: Georges Mouse, Minus Breeding RN Signed: 06/05/2020 5:07:17 PM By: Linton Ham MD Entered By: Georges Mouse, Minus Breeding on 06/05/2020 11:14:12 TAYON, PAREKH ([id number], IDDINGS ([id number]) -------------------------------------------------------------------------------- Problem List Details Patient Name: MCCARTNEY, CHUBA. Date of Service: 06/05/2020 10:00  AM Medical Record Number: [id number] Patient Account Number: [id number] Date of Birth/Sex: 06-11-1950 (70 y.o. M) Treating RN: Dolan Amen Primary Care Provider: Bernerd Limbo Other Clinician: Jeanine Luz Referring Provider: Varney Biles Treating Provider/Extender: Tito Dine in Treatment: 0 Active Problems ICD-10 Encounter Code Description Active Date MDM Diagnosis I89.0 Lymphedema, not elsewhere classified 06/05/2020 No Yes I87.331 Chronic venous hypertension (idiopathic) with ulcer and inflammation of 06/05/2020 No Yes right lower extremity L97.818 Non-pressure  chronic ulcer of other part of right lower leg with other 06/05/2020 No Yes specified severity Inactive Problems Resolved Problems Electronic Signature(s) Signed: 06/05/2020 5:07:17 PM By: Linton Ham MD Entered By: Linton Ham on 06/05/2020 11:15:55 Philyaw, Guy Sandifer ([id number]) -------------------------------------------------------------------------------- Progress Note Details Patient Name: THADDEAUS, MONICA. Date of Service: 06/05/2020 10:00 AM Medical Record Number: [id number] Patient Account Number: [id number] Date of Birth/Sex: 1950-09-14 (70 y.o. 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 He is wheelchair dependent but can transfer himself. He was seen in the ER on 04/24/2020 with right leg cellulitis given  doxycycline. He was back in the ER on 05/27/2020 with stasis dermatitis however they went ahead and gave him a course of Keflex. He says his wounds started sometime in March where he had wheelchair trauma with another resident. This may have happened twice. He has [facility] where not exactly sure what they are putting on the wound but it is probably collagen. He denies any prior history of pelvic trauma, radiation, malignancy etc. I am not able to really get a history of how long the amount of edema in his right leg is been present. Past medical history includes left above-knee amputation, congestive heart failure, hypothyroidism, history of MRSA, A. fib on Xarelto. Patient claims he is not a diabetic but there is some parts of his assisted living records that suggest he has diabetes although his last hemoglobin A1c that I can see was not indicative. ABIs were noncompressible Patient History Information obtained from Patient. Allergies morphine (Severity: Moderate, Reaction: rash) Social History Never smoker, Marital Status - Single, Alcohol Use - Never, Drug Use - No History, Caffeine Use - Daily. Medical History Respiratory Patient has history of Sleep Apnea - NO CPAP used Cardiovascular Patient has history of Arrhythmia - AFIB, Congestive Heart Failure, Deep Vein Thrombosis - HX Musculoskeletal Patient has history of Osteoarthritis - bilateral knee replacements hx Hospitalization/Surgery History - left knee replacements 2003. - right knee replacement 2004. - left AKA post sepsis artificial joint knee. Medical And Surgical History Notes Endocrine A1C 2021 5.5-patient states not diabetic Review of Systems (ROS) Constitutional Symptoms (General Health) Denies complaints or symptoms of Fatigue, Fever, Chills, Marked Weight Change. Eyes Denies complaints or symptoms of Dry Eyes, Vision Changes, Glasses / Contacts. Ear/Nose/Mouth/Throat Denies complaints or symptoms of Difficult  clearing ears, Sinusitis. Hematologic/Lymphatic Denies complaints or symptoms of Bleeding / Clotting Disorders, Human Immunodeficiency Virus. Respiratory Complains or has symptoms of Shortness of Breath. Cardiovascular Complains or has symptoms of LE edema - hx and current right, HX cardioversion Gastrointestinal Denies complaints or symptoms of Frequent diarrhea, Nausea, Vomiting, GERD Endocrine Complains or has symptoms of Thyroid disease. [address] GEOFFREY, [name] ([id number]) Denies complaints or symptoms of Kidney failure/ Dialysis, Incontinence/dribbling. Immunological Denies complaints or symptoms of Hives, Itching. Integumentary (Skin) Denies complaints or symptoms of Wounds, Bleeding or bruising tendency, Breakdown, Swelling. Neurologic Denies complaints or symptoms of Numbness/parasthesias, Focal/Weakness. Psychiatric Denies complaints or symptoms of Anxiety, Claustrophobia. Objective Constitutional  Sitting or standing Blood Pressure is within target range for patient.. Pulse regular and within target range for patient.Marland Kitchen Respirations regular, non- labored and within target range.. Temperature is normal and within the target range for the patient.Marland Kitchen appears in no distress. Vitals Time Taken: 10:00 AM, Height: 68 in, Source: Stated, Weight: 365 lbs, Source: Stated, BMI: 55.5, Temperature: 97.9 F, Pulse: 77 bpm, Respiratory Rate: 20 breaths/min, Blood Pressure: 124/78 mmHg. Cardiovascular Pedal pulses are palpable on the right both dorsalis pedis and posterior tibial. Capillary refill time is normal. There is tense nonpitting edema in the right lower leg this extends up into his thigh especially superiorly.Marland Kitchen Psychiatric No evidence of depression, anxiety, or agitation. Calm, cooperative, and communicative. Appropriate interactions and affect.. General Notes: Wound exam; the patient has several open areas. There are 2 anteriorly. Medially there is a fairly large area  extending medially into the posterior part of the calf. All of these are weepy. Not particularly infected looking. Integumentary (Hair, Skin) Skin changes in the right lower leg of chronic venous insufficiency also some polypoid areas suggestive of lymphedema skin changes. Wound #1 status is Open. Original cause of wound was Shear/Friction. The date acquired was: 04/08/2020. The wound is located on the Right,Distal,Posterior Lower Leg. The wound measures 4.5cm length x 7cm width x 0.1cm depth; 24.74cm^2 area and 2.474cm^3 volume. There is Fat Layer (Subcutaneous Tissue) exposed. There is no tunneling or undermining noted. There is a medium amount of serosanguineous drainage noted. There is large (67-100%) red, pink granulation within the wound bed. There is a small (1-33%) amount of necrotic tissue within the wound bed including Adherent Slough. Wound #2 status is Open. Original cause of wound was Shear/Friction. The date acquired was: 04/08/2020. The wound is located on the Right,Midline,Anterior Lower Leg. The wound measures 6cm length x 9cm width x 0.1cm depth; 42.412cm^2 area and 4.241cm^3 volume. There is Fat Layer (Subcutaneous Tissue) exposed. There is no tunneling or undermining noted. There is a medium amount of serosanguineous drainage noted. There is large (67-100%) red, pink granulation within the wound bed. There is a small (1-33%) amount of necrotic tissue within the wound bed including Adherent Slough. 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 There was a Three Layer Compression TORIE, DONICA ([id number]) Therapy Procedure by Dolan Amen, RN. Post  procedure Diagnosis Wound #1: Same as Pre-Procedure Wound #2 Pre-procedure diagnosis of Wound #2 is a Lymphedema located on the Right,Midline,Anterior Lower Leg . There was a Three Layer Compression Therapy Procedure by Dolan Amen, RN. Post procedure Diagnosis Wound #2: Same as Pre-Procedure Plan Follow-up Appointments: Return Appointment in 1 week. Home Health: [place]: - [facility] for wound care. May utilize formulary equivalent dressing for wound treatment orders unless otherwise specified. Home Health Nurse may visit PRN to address patient s wound care needs. Scheduled days for dressing changes to be completed; exception, patient has scheduled wound care visit that day. - Monday/Tuesday dressing change-Patient seen in office on Thursdays Edema Control - Lymphedema / Segmental Compressive Device / Other: Optional: One layer of unna paste to top of compression wrap (to act as an anchor). 3 Layer Compression System for Lymphedema. Elevate legs to the level of the heart  and pump ankles as often as possible Elevate leg(s) parallel to the floor when sitting. WOUND #1: - Lower Leg Wound Laterality: Right, Posterior, Distal Cleanser: Soap and Water 2 x Per Week/15 Days Discharge Instructions: Gently cleanse wound with antibacterial soap, rinse and pat dry prior to dressing wounds Peri-Wound Care: Triamcinolone Acetonide Cream, 0.1%, 15 (g) tube 2 x Per Week/15 Days Primary Dressing: Silvercel 4 1/4x 4 1/4 (in/in) 2 x Per Week/15 Days Discharge Instructions: Apply Silvercel 4 1/4x 4 1/4 (in/in) as instructed Secondary Dressing: ABD Pad 5x9 (in/in) 2 x Per Week/15 Days Discharge Instructions: Cover with ABD pad Compression Wrap: Profore Lite LF 3 Multilayer Compression Bandaging System 2 x Per Week/15 Days Discharge Instructions: Apply 3 multi-layer wrap as prescribed. WOUND #2: - Lower Leg Wound Laterality: Right, Midline, Anterior Cleanser:  Soap and Water 2 x Per Week/15 Days Discharge Instructions: Gently cleanse wound with antibacterial soap, rinse and pat dry prior to dressing wounds Peri-Wound Care: Triamcinolone Acetonide Cream, 0.1%, 15 (g) tube 2 x Per Week/15 Days Primary Dressing: Silvercel 4 1/4x 4 1/4 (in/in) 2 x Per Week/15 Days Discharge Instructions: Apply Silvercel 4 1/4x 4 1/4 (in/in) as instructed Secondary Dressing: ABD Pad 5x9 (in/in) 2 x Per Week/15 Days Discharge Instructions: Cover with ABD pad Compression Wrap: Profore Lite LF 3 Multilayer Compression Bandaging System 2 x Per Week/15 Days Discharge Instructions: Apply 3 multi-layer wrap as prescribed. #1 I am putting silver alginate ABDs under 3 layer compression 2. Although the patient's ABDs were noncompressible his pedal pulses were easily palpable and his capillary refill time was normal foot was warm. I think he should be able to tolerate 3 layer compression which is after all going to be necessary to reduce the amount of swelling in the right leg so the superficial wounds he has will heal 3. I have also asked him to keep his leg elevated when he can although he is wheelchair-bound 4. He had to contact the assisted living to find out that he has [facility]. We will see if they could we they can change this once before we see him next week. 5. The patient is morbidly obese but no obvious evidence of congestive heart failure at the bedside. I spent 45 minutes in review of this patient's past medical history, face-to-face evaluation and preparation of this record Electronic Signature(s) Signed: 06/05/2020 5:07:17 PM By: Linton Ham MD Entered By: Linton Ham on 06/05/2020 11:26:39 CHAMBERLAIN, MARKSON ([id number]) -------------------------------------------------------------------------------- ROS/PFSH Details Patient Name: ASKIA, ZENK. Date of Service: 06/05/2020 10:00 AM Medical Record Number: [id number] Patient Account Number:  [id number] Date of Birth/Sex: July 20, 1950 (70 y.o. M) Treating RN: Donnamarie Poag Primary Care Provider: Bernerd Limbo Other Clinician: Jeanine Luz Referring Provider: Varney Biles Treating Provider/Extender: Tito Dine in Treatment: 0 Information Obtained From Patient Constitutional Symptoms (General Health) Complaints and Symptoms: Negative for: Fatigue; Fever; Chills; Marked Weight Change Eyes Complaints and Symptoms: Negative for: Dry Eyes; Vision Changes; Glasses / Contacts Ear/Nose/Mouth/Throat Complaints and Symptoms: Negative for: Difficult clearing ears; Sinusitis Hematologic/Lymphatic Complaints and Symptoms: Negative for: Bleeding / Clotting Disorders; Human Immunodeficiency Virus Respiratory Complaints and Symptoms: Positive for: Shortness of Breath Medical History: Positive for: Sleep Apnea - NO CPAP used Cardiovascular Complaints and Symptoms: Positive for: LE edema - hx and current right Review of System Notes: HX cardioversion Medical History: Positive for: Arrhythmia - AFIB; Congestive Heart Failure; Deep Vein Thrombosis - HX Gastrointestinal Complaints and Symptoms: Negative for: Frequent diarrhea; Nausea; Vomiting Review of System  Notes: GERD Endocrine Complaints and Symptoms: Positive for: Thyroid disease Medical History: Past Medical History Notes: A1C 2021 5.5-patient states not diabetic Genitourinary KEAWE, FELLNER ([id number]) Complaints and Symptoms: Negative for: Kidney failure/ Dialysis; Incontinence/dribbling Immunological Complaints and Symptoms: Negative for: Hives; Itching Integumentary (Skin) Complaints and Symptoms: Negative for: Wounds; Bleeding or bruising tendency; Breakdown; Swelling Neurologic Complaints and Symptoms: Negative for: Numbness/parasthesias; Focal/Weakness Psychiatric Complaints and Symptoms: Negative for: Anxiety; Claustrophobia Musculoskeletal Medical History: Positive for: Osteoarthritis  - bilateral knee replacements hx Oncologic Immunizations Pneumococcal Vaccine: Received Pneumococcal Vaccination: Yes Implantable Devices None Hospitalization / Surgery History Type of Hospitalization/Surgery left knee replacements 2003 right knee replacement 2004 left AKA post sepsis artificial joint knee Family and Social History Never smoker; Marital Status - Single; Alcohol Use: Never; Drug Use: No History; Caffeine Use: Daily; Financial Concerns: No; Food, Clothing or Shelter Needs: No; Support System Lacking: No; Transportation Concerns: No Electronic Signature(s) Signed: 06/05/2020 2:09:49 PM By: Donnamarie Poag Signed: 06/05/2020 5:07:17 PM By: Linton Ham MD Entered By: Donnamarie Poag on 06/05/2020 11:03:32 TUAN, DELSIGNORE ([id number]) -------------------------------------------------------------------------------- [place] Details Patient Name: CONSTANCE, ANCHETA. Date of Service: 06/05/2020 Medical Record Number: [id number] Patient Account Number: [id number] Date of Birth/Sex: 27-Sep-1950 (70 y.o. M) Treating RN: Dolan Amen Primary Care Provider: Bernerd Limbo Other Clinician: Jeanine Luz Referring Provider: Varney Biles Treating Provider/Extender: Tito Dine in Treatment: 0 Diagnosis Coding ICD-10 Codes Code Description I89.0 Lymphedema, not elsewhere classified I87.331 Chronic venous hypertension (idiopathic) with ulcer and inflammation of right lower extremity L97.818 Non-pressure chronic ulcer of other part of right lower leg with other specified severity Facility Procedures CPT4 Code: [id number] Description: 99213 - WOUND CARE VISIT-LEV 3 EST PT Modifier: Quantity: 1 CPT4 Code: [id number] Description: (Facility Use Only) [facility] LWR RT LEG Modifier: Quantity: 1 Physician Procedures CPT4 Code Description: [id number] - WC PHYS LEVEL 4 - NEW PT Modifier: Quantity: 1 CPT4 Code Description: ICD-10 Diagnosis Description  I89.0 Lymphedema, not elsewhere classified I87.331 Chronic venous hypertension (idiopathic) with ulcer and inflammation of righ L97.818 Non-pressure chronic ulcer of other part of right lower leg with  other speci Modifier: t lower extremit fied severity Quantity: y Engineer, maintenance) Signed: 06/05/2020 5:07:17 PM By: Linton Ham MD Entered By: Linton Ham on 06/05/2020 11:27:09

## 2020-06-05 NOTE — Progress Notes (Signed)
[name] ([id number]) Visit Report for 06/05/2020 Abuse/Suicide Risk Screen Details Patient Name: [name], [name]. Date of Service: 06/05/2020 10:00 AM Medical Record Number: [id number] Patient Account Number: [id number] Date of Birth/Sex: 1950-12-25 (70 y.o. M) Treating RN: Donnamarie Poag Primary Care Mycah Formica: Bernerd Limbo Other Clinician: Jeanine Luz Referring Christion Leonhard: Varney Biles Treating Zyden Suman/Extender: Tito Dine in Treatment: 0 Abuse/Suicide Risk Screen Items Answer ABUSE RISK SCREEN: Has anyone close to you tried to hurt or harm you recentlyo No Do you feel uncomfortable with anyone in your familyo No Has anyone forced you do things that you didnot want to doo No Electronic Signature(s) Signed: 06/05/2020 2:09:49 PM By: Donnamarie Poag Entered By: Donnamarie Poag on 06/05/2020 10:28:11 Geiler, Guy Sandifer ([id number]) -------------------------------------------------------------------------------- Activities of Daily Living Details Patient Name: [name], [name]. Date of Service: 06/05/2020 10:00 AM Medical Record Number: [id number] Patient Account Number: [id number] Date of Birth/Sex: 09/08/1950 (70 y.o. M) Treating RN: Donnamarie Poag Primary Care Graceann Boileau: Bernerd Limbo Other Clinician: Jeanine Luz Referring Cimone Fahey: Varney Biles Treating Naasir Carreira/Extender: Tito Dine in Treatment: 0 Activities of Daily Living Items Answer Activities of Daily Living (Please select one for each item) Drive Automobile Need Assistance Take Medications Need Assistance Use Telephone Completely Able Care for Appearance Completely Able Use Toilet Completely Able Bath / Shower Need Assistance Dress Self Need Assistance Feed Self Completely Able Walk Need Assistance Get In / Out Bed Completely Able Housework Not Able Prepare Meals Need [place] for Self Need Assistance Notes Lives at assisted living-uses W/C Electronic  Signature(s) Signed: 06/05/2020 10:59:37 AM By: Donnamarie Poag Entered By: Donnamarie Poag on 06/05/2020 10:59:37 Crombie, Guy Sandifer ([id number]) -------------------------------------------------------------------------------- Education Screening Details Patient Name: [name], [name]. Date of Service: 06/05/2020 10:00 AM Medical Record Number: [id number] Patient Account Number: [id number] Date of Birth/Sex: 1950/11/08 (70 y.o. M) Treating RN: Donnamarie Poag Primary Care Maude Gloor: Bernerd Limbo Other Clinician: Jeanine Luz Referring Kateryn Marasigan: Varney Biles Treating Janny Crute/Extender: Tito Dine in Treatment: 0 Primary Learner Assessed: Patient Learning Preferences/Education Level/Primary Language Learning Preference: Explanation Highest Education Level: College or Above Preferred Language: English Cognitive Barrier Language Barrier: No Translator Needed: No Memory Deficit: No Emotional Barrier: No Cultural/Religious Beliefs Affecting Medical Care: No Physical Barrier Impaired Vision: No Impaired Hearing: No Decreased Hand dexterity: No Knowledge/Comprehension Knowledge Level: High Comprehension Level: High Ability to understand written instructions: High Ability to understand verbal instructions: High Motivation Anxiety Level: Calm Cooperation: Cooperative Education Importance: Acknowledges Need Interest in Health Problems: Asks Questions Perception: Coherent Willingness to Engage in Self-Management High Activities: Readiness to Engage in Self-Management High Activities: Electronic Signature(s) Signed: 06/05/2020 2:09:49 PM By: Donnamarie Poag Entered By: Donnamarie Poag on 06/05/2020 10:26:09 JOVEN, MOM ([id number]) -------------------------------------------------------------------------------- Fall Risk Assessment Details Patient Name: [name]. Date of Service: 06/05/2020 10:00 AM Medical Record Number: [id number] Patient Account Number: [id number] Date of  Birth/Sex: [birth year] (70 y.o. M) Treating RN: Donnamarie Poag Primary Care Elion Hocker: Bernerd Limbo Other Clinician: Jeanine Luz Referring Daniyal Tabor: Varney Biles Treating Abigale Dorow/Extender: Tito Dine in Treatment: 0 Fall Risk Assessment Items Have you had 2 or more falls in the last 12 monthso 0 No Have you had any fall that resulted in injury in the last 12 monthso 0 No FALLS RISK SCREEN History of falling - immediate or within 3 months 0 No Secondary diagnosis (Do you have 2 or more medical diagnoseso) 0 No Ambulatory aid None/bed rest/wheelchair/nurse 0 No Crutches/cane/walker 15 Yes Furniture 0 No Intravenous therapy  Access/Saline/Heparin Lock 0 No Gait/Transferring Normal/ bed rest/ wheelchair 0 No Weak (short steps with or without shuffle, stooped but able to lift head while walking, may 0 No seek support from furniture) Impaired (short steps with shuffle, may have difficulty arising from chair, head down, impaired 20 Yes balance) Mental Status Oriented to own ability 0 Yes Notes left BKA, 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 Date of Service: 06/05/2020 10:00 AM Medical Record Number: [id number] Patient Account Number: [id number] Date of Birth/Sex: Jan 23, 1951 (70 y.o. M) Treating RN: Donnamarie Poag Primary Care Tuvia Woodrick: Bernerd Limbo Other Clinician: Jeanine Luz Referring Timmie Calix: Varney Biles Treating Shalin Vonbargen/Extender: Tito Dine in Treatment: 0 Foot Assessment Items Site Locations + = Sensation present, - = Sensation absent, C = Callus, U = Ulcer R = Redness, W = Warmth, M = Maceration, PU = Pre-ulcerative lesion F = Fissure, S = Swelling, D =  Dryness Assessment Right: Left: Other Deformity: No No Prior Foot Ulcer: No No Prior Amputation: No Yes Charcot Joint: No No Ambulatory Status: Ambulatory With Help Assistance Device: Wheelchair Gait: Unsteady Notes 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 Date of Service: 06/05/2020 10:00 AM Medical Record Number: [id number] Patient Account Number: [id number] Date of Birth/Sex: 1950-02-13 (70 y.o. M) Treating RN: Donnamarie Poag Primary Care Dwayna Kentner: Bernerd Limbo Other Clinician: Jeanine Luz Referring Geramy Lamorte: Varney Biles Treating Nas Wafer/Extender: Tito Dine in Treatment: 0 Height (in): 68 Weight (lbs): 365 Body Mass Index (BMI): 55.5 Nutrition Risk Screening Items Score Screening NUTRITION RISK SCREEN: I have an illness or condition that made me change the kind and/or amount of food I eat 0 No I eat fewer than two meals per day 0 No I eat few fruits and vegetables, or milk products 0 No I have three or more drinks of beer, liquor or wine almost every day 0 No I have tooth or mouth problems that make it hard for me to eat 0 No I don't always have enough money to buy the food I need 0 No I eat alone most of the time 0 No I take three or more different prescribed or over-the-counter drugs a day 1 Yes Without wanting to, I have lost or gained 10 pounds in the last six months 0 No I am not always physically able to shop, cook and/or feed myself 0 No Nutrition Protocols Good Risk Protocol 0 No interventions needed Moderate Risk Protocol High Risk Proctocol Risk Level: Good Risk Score: 1 Electronic Signature(s) Signed: 06/05/2020 2:09:49 PM By: Donnamarie Poag Entered  ByDonnamarie Poag on 06/05/2020 10:27:07

## 2020-06-12 ENCOUNTER — Encounter: Payer: Medicare Other | Attending: Physician Assistant | Admitting: Physician Assistant

## 2020-06-12 ENCOUNTER — Other Ambulatory Visit: Payer: Self-pay

## 2020-06-12 DIAGNOSIS — I110 Hypertensive heart disease with heart failure: Secondary | ICD-10-CM | POA: Diagnosis not present

## 2020-06-12 DIAGNOSIS — Z7901 Long term (current) use of anticoagulants: Secondary | ICD-10-CM | POA: Insufficient documentation

## 2020-06-12 DIAGNOSIS — I509 Heart failure, unspecified: Secondary | ICD-10-CM | POA: Insufficient documentation

## 2020-06-12 DIAGNOSIS — I890 Lymphedema, not elsewhere classified: Secondary | ICD-10-CM | POA: Diagnosis not present

## 2020-06-12 DIAGNOSIS — I87331 Chronic venous hypertension (idiopathic) with ulcer and inflammation of right lower extremity: Secondary | ICD-10-CM | POA: Diagnosis not present

## 2020-06-12 DIAGNOSIS — Z993 Dependence on wheelchair: Secondary | ICD-10-CM | POA: Insufficient documentation

## 2020-06-12 DIAGNOSIS — L97818 Non-pressure chronic ulcer of other part of right lower leg with other specified severity: Secondary | ICD-10-CM | POA: Diagnosis present

## 2020-06-12 DIAGNOSIS — Z89612 Acquired absence of left leg above knee: Secondary | ICD-10-CM | POA: Insufficient documentation

## 2020-06-12 NOTE — Progress Notes (Addendum)
ARMAND, WILLEMS ([id number]) Visit Report for 06/12/2020 Arrival Information Details Patient Name: [name], [name]. Date of Service: 06/12/2020 2:45 PM Medical Record Number: [id number] Patient Account Number: [id number] Date of Birth/Sex: 01/27/1951 (70 y.o. M) Treating RN: Carlene Coria Primary Care Maanav Kassabian: Coletta Memos, DAVID Other Clinician: Referring Zayin Valadez: Coletta Memos, DAVID Treating Derriona Branscom/Extender: Skipper Cliche in Treatment: 1 Visit Information History Since Last Visit All ordered tests and consults were completed: No Patient Arrived: Wheel Chair Added or deleted any medications: No Arrival Time: 15:12 Any new allergies or adverse reactions: No Accompanied By: self Had a fall or experienced change in No Transfer Assistance: None activities of daily living that may affect Patient Identification Verified: Yes risk of falls: Secondary Verification Process Completed: Yes Signs or symptoms of abuse/neglect since last visito No Patient Requires Transmission-Based No Hospitalized since last visit: No Precautions: Implantable device outside of the clinic excluding No Patient Has Alerts: Yes cellular tissue based products placed in the center Patient Alerts: Patient on Blood Thinner since last visit: NOT diabetic Has Dressing in Place as Prescribed: Yes XARELTO Has Compression in Place as Prescribed: Yes Lives at [facility] Pain Present Now: No ABI non compressible Rleg Left AKA Electronic Signature(s) Signed: 06/23/2020 8:33:59 AM By: Carlene Coria RN Entered By: Carlene Coria on 06/12/2020 15:30:22 PAUL, GARDEA ([id number]) -------------------------------------------------------------------------------- Clinic Level of Care Assessment Details Patient Name: Garrett, [name]. Date of Service: 06/12/2020 2:45 PM Medical Record Number: [id number] Patient Account Number: [id number] Date of Birth/Sex: 30-Jun-1950 (70 y.o. M) Treating RN: Dolan Amen Primary Care Sabastien Tyler:  Bernerd Limbo Other Clinician: Referring Shaelee Forni: Coletta Memos, DAVID Treating Devun Anna/Extender: Skipper Cliche in Treatment: 1 Clinic Level of Care Assessment Items TOOL 1 Quantity Score []  - Use when EandM and Procedure is performed on INITIAL visit 0 ASSESSMENTS - Nursing Assessment / Reassessment []  - General Physical Exam (combine w/ comprehensive assessment (listed just below) when performed on new 0 pt. evals) []  - 0 Comprehensive Assessment (HX, ROS, Risk Assessments, Wounds Hx, etc.) ASSESSMENTS - Wound and Skin Assessment / Reassessment []  - Dermatologic / Skin Assessment (not related to wound area) 0 ASSESSMENTS - Ostomy and/or Continence Assessment and Care []  - Incontinence Assessment and Management 0 []  - 0 Ostomy Care Assessment and Management (repouching, etc.) PROCESS - Coordination of Care []  - Simple Patient / Family Education for ongoing care 0 []  - 0 Complex (extensive) Patient / Family Education for ongoing care []  - 0 Staff obtains Programmer, systems, Records, Test Results / Process Orders []  - 0 Staff telephones HHA, Nursing Homes / Clarify orders / etc []  - 0 Routine Transfer to another Facility (non-emergent condition) []  - 0 Routine Hospital Admission (non-emergent condition) []  - 0 New Admissions / Biomedical engineer / Ordering NPWT, Apligraf, etc. []  - 0 Emergency Hospital Admission (emergent condition) PROCESS - Special Needs []  - Pediatric / Minor Patient Management 0 []  - 0 Isolation Patient Management []  - 0 Hearing / Language / Visual special needs []  - 0 Assessment of Community assistance (transportation, D/C planning, etc.) []  - 0 Additional assistance / Altered mentation []  - 0 Support Surface(s) Assessment (bed, cushion, seat, etc.) INTERVENTIONS - Miscellaneous []  - External ear exam 0 []  - 0 Patient Transfer (multiple staff / Civil Service fast streamer / Similar devices) []  - 0 Simple Staple / Suture removal (25 or less) []  - 0 Complex Staple /  Suture removal (26 or more) []  - 0 Hypo/Hyperglycemic Management (do not check if billed separately) []  - 0 Ankle / Brachial  Index (ABI) - do not check if billed separately Has the patient been seen at the hospital within the last three years: Yes Total Score: 0 Level Of Care: ____ [name] ([id number]) Electronic Signature(s) Signed: 06/12/2020 5:11:22 PM By: Georges Mouse, Minus Breeding RN Entered By: Georges Mouse, Minus Breeding on 06/12/2020 15:58:44 DABID, GODOWN ([id number]) -------------------------------------------------------------------------------- Compression Therapy Details Patient Name: Garrett, [name]. Date of Service: 06/12/2020 2:45 PM Medical Record Number: [id number] Patient Account Number: [id number] Date of Birth/Sex: 05-04-1950 (70 y.o. M) Treating RN: Dolan Amen Primary Care Malea Swilling: Bernerd Limbo Other Clinician: Referring Aneshia Jacquet: Coletta Memos, DAVID Treating Anjeanette Petzold/Extender: Skipper Cliche in Treatment: 1 Compression Therapy Performed for Wound Assessment: Wound #1 Right,Distal,Posterior Lower Leg Performed By: Clinician Dolan Amen, RN Compression Type: Three Layer Post Procedure Diagnosis Same as Pre-procedure Notes pt tolerating wrap well Electronic Signature(s) Signed: 06/12/2020 5:11:22 PM By: Georges Mouse, Minus Breeding RN Entered By: Georges Mouse, Kenia on 06/12/2020 15:56:57 HAZEM, KENNER ([id number]) -------------------------------------------------------------------------------- Compression Therapy Details Patient Name: Garrett, [name]. Date of Service: 06/12/2020 2:45 PM Medical Record Number: [id number] Patient Account Number: [id number] Date of Birth/Sex: 04/02/1950 (70 y.o. M) Treating RN: Dolan Amen Primary Care Alex Mcmanigal: Bernerd Limbo Other Clinician: Referring Adisa Vigeant: Coletta Memos, DAVID Treating Bradin Mcadory/Extender: Skipper Cliche in Treatment: 1 Compression Therapy Performed for Wound Assessment: Wound #2 Right,Midline,Anterior Lower  Leg Performed By: Clinician Dolan Amen, RN Compression Type: Three Layer Post Procedure Diagnosis Same as Pre-procedure Notes pt tolerating wrap well Electronic Signature(s) Signed: 06/12/2020 5:11:22 PM By: Georges Mouse, Minus Breeding RN Entered By: Georges Mouse, Kenia on 06/12/2020 15:56:57 NIKAN, ELLINGSON ([id number]) -------------------------------------------------------------------------------- Lower Extremity Assessment Details Patient Name: [name], [name]. Date of Service: 06/12/2020 2:45 PM Medical Record Number: [id number] Patient Account Number: [id number] Date of Birth/Sex: 11-Oct-1950 (70 y.o. M) Treating RN: Carlene Coria Primary Care Aasiyah Auerbach: Bernerd Limbo Other Clinician: Referring Tatyana Biber: Coletta Memos, DAVID Treating Gerlean Cid/Extender: Skipper Cliche in Treatment: 1 Edema Assessment Assessed: [Left: No] [Right: No] Edema: [Left: Ye] [Right: s] Calf Left: Right: Point of Measurement: 34.5 cm From Medial Instep 52 cm Ankle Left: Right: Point of Measurement: 18 cm From Medial Instep 26 cm Vascular Assessment Pulses: Dorsalis Pedis Palpable: [Right:Yes] Electronic Signature(s) Signed: 06/23/2020 8:33:59 AM By: Carlene Coria RN Entered By: Carlene Coria on 06/12/2020 15:33:48 Iiams, Guy Sandifer ([id number]) -------------------------------------------------------------------------------- Multi Wound Chart Details Patient Name: JLON, BETKER. Date of Service: 06/12/2020 2:45 PM Medical Record Number: [id number] Patient Account Number: [id number] Date of Birth/Sex: 1950-07-11 (70 y.o. M) Treating RN: Dolan Amen Primary Care Jolaine Fryberger: Bernerd Limbo Other Clinician: Referring Haillie Radu: Coletta Memos, DAVID Treating Vianka Ertel/Extender: Skipper Cliche in Treatment: 1 Vital Signs Height(in): 68 Pulse(bpm): 26 Weight(lbs): 365 Blood Pressure(mmHg): 100/68 Body Mass Index(BMI): 55 Temperature(F): 97.8 Respiratory Rate(breaths/min): 18 Photos: [N/A:N/A] Wound Location:  Right, Distal, Posterior Lower Leg Right, Midline, Anterior Lower Leg N/A Wounding Event: Shear/Friction Shear/Friction N/A Primary Etiology: Lymphedema Lymphedema N/A Comorbid History: Sleep Apnea, Arrhythmia, Sleep Apnea, Arrhythmia, N/A Congestive Heart Failure, Deep Vein Congestive Heart Failure, Deep Vein Thrombosis, Osteoarthritis Thrombosis, Osteoarthritis Date Acquired: 04/08/2020 04/08/2020 N/A Weeks of Treatment: 1 1 N/A Wound Status: Open Open N/A Clustered Wound: No Yes N/A Measurements L x W x D (cm) 2.2x4.5x0.4 12x10x0.1 N/A Area (cm) : 7.775 94.248 N/A Volume (cm) : 3.11 9.425 N/A % Reduction in Area: 68.60% -122.20% N/A % Reduction in Volume: -25.70% -122.20% N/A Classification: Full Thickness Without Exposed Full Thickness Without Exposed N/A Support Structures Support Structures Exudate Amount: Medium Medium N/A Exudate Type: Serosanguineous Serosanguineous N/A Exudate Color:  red, brown red, brown N/A Granulation Amount: Large (67-100%) Large (67-100%) N/A Granulation Quality: Red, Pink Red, Pink N/A Necrotic Amount: Small (1-33%) Small (1-33%) N/A Exposed Structures: Fat Layer (Subcutaneous Tissue): Fat Layer (Subcutaneous Tissue): N/A Yes Yes Fascia: No Fascia: No Tendon: No Tendon: No Muscle: No Muscle: No Joint: No Joint: No Bone: No Bone: No Epithelialization: None None N/A Treatment Notes Electronic Signature(s) Signed: 06/12/2020 5:11:22 PM By: Georges Mouse, Minus Breeding RN Entered By: Georges Mouse, Minus Breeding on 06/12/2020 15:56:29 BERTIE, SIMIEN ([id number]) -------------------------------------------------------------------------------- Salmon Details Patient Name: KENTARO, ALEWINE. Date of Service: 06/12/2020 2:45 PM Medical Record Number: [id number] Patient Account Number: [id number] Date of Birth/Sex: December 08, 1950 (70 y.o. M) Treating RN: Dolan Amen Primary Care Analiya Porco: Bernerd Limbo Other Clinician: Referring Thaine Garriga: Coletta Memos,  DAVID Treating Bronwyn Belasco/Extender: Skipper Cliche in Treatment: 1 Active Inactive Wound/Skin Impairment Nursing Diagnoses: Impaired tissue integrity Goals: Patient/caregiver will verbalize understanding of skin care regimen Date Initiated: 06/05/2020 Target Resolution Date: 06/05/2020 Goal Status: Active Ulcer/skin breakdown will have a volume reduction of 30% by week 4 Date Initiated: 06/05/2020 Target Resolution Date: 07/05/2020 Goal Status: Active Ulcer/skin breakdown will have a volume reduction of 50% by week 8 Date Initiated: 06/05/2020 Target Resolution Date: 08/05/2020 Goal Status: Active Ulcer/skin breakdown will have a volume reduction of 80% by week 12 Date Initiated: 06/05/2020 Target Resolution Date: 09/04/2020 Goal Status: Active Ulcer/skin breakdown will heal within 14 weeks Date Initiated: 06/05/2020 Target Resolution Date: 10/05/2020 Goal Status: Active Interventions: Assess patient/caregiver ability to obtain necessary supplies Assess patient/caregiver ability to perform ulcer/skin care regimen upon admission and as needed Assess ulceration(s) every visit Provide education on ulcer and skin care Treatment Activities: Referred to DME Estefany Goebel for dressing supplies : 06/05/2020 Skin care regimen initiated : 06/05/2020 Notes: Electronic Signature(s) Signed: 06/12/2020 5:11:22 PM By: Georges Mouse, Minus Breeding RN Entered By: Georges Mouse, Minus Breeding on 06/12/2020 15:56:03 PABLO, STAUFFER ([id number]) -------------------------------------------------------------------------------- Pain Assessment Details Patient Name: NIRAJ, KUDRNA. Date of Service: 06/12/2020 2:45 PM Medical Record Number: [id number] Patient Account Number: [id number] Date of Birth/Sex: 05-07-1950 (70 y.o. M) Treating RN: Carlene Coria Primary Care Macallister Ashmead: Bernerd Limbo Other Clinician: Referring Brileigh Sevcik: Coletta Memos, DAVID Treating Abdulrahim Siddiqi/Extender: Skipper Cliche in Treatment: 1 Active Problems Location  of Pain Severity and Description of Pain Patient Has Paino No Site Locations Pain Management and Medication Current Pain Management: Electronic Signature(s) Signed: 06/23/2020 8:33:59 AM By: Carlene Coria RN Entered By: Carlene Coria on 06/12/2020 15:30:48 Ohnemus, Guy Sandifer ([id number]) -------------------------------------------------------------------------------- Patient/Caregiver Education Details Patient Name: MAJD, TISSUE. Date of Service: 06/12/2020 2:45 PM Medical Record Number: [id number] Patient Account Number: [id number] Date of Birth/Gender: [birth year] (70 y.o. M) Treating RN: Dolan Amen Primary Care Physician: Bernerd Limbo Other Clinician: Referring Physician: Coletta Memos, DAVID Treating Physician/Extender: Skipper Cliche in Treatment: 1 Education Assessment Education Provided To: Patient Education Topics Provided Wound/Skin Impairment: Methods: Explain/Verbal Responses: State content correctly Electronic Signature(s) Signed: 06/12/2020 5:11:22 PM By: Georges Mouse, Minus Breeding RN Entered By: Georges Mouse, Minus Breeding on 06/12/2020 15:58:58 HEBERT, DOOLING ([id number]) -------------------------------------------------------------------------------- Wound Assessment Details Patient Name: WARD, BOISSONNEAULT. Date of Service: 06/12/2020 2:45 PM Medical Record Number: [id number] Patient Account Number: [id number] Date of Birth/Sex: [birth year] (70 y.o. M) Treating RN: Carlene Coria Primary Care Keyaira Clapham: Bernerd Limbo Other Clinician: Referring Cadyn Rodger: Coletta Memos, DAVID Treating Jewel Mcafee/Extender: Skipper Cliche in Treatment: 1 Wound Status Wound Number: 1 Primary Lymphedema Etiology: Wound Location: Right, Distal, Posterior Lower Leg Wound Open Wounding Event: Shear/Friction Status: Date Acquired: 04/08/2020 Comorbid Sleep Apnea, Arrhythmia, Congestive  Heart Failure, Weeks Of Treatment: 1 History: Deep Vein Thrombosis, Osteoarthritis Clustered Wound: No Photos Wound  Measurements Length: (cm) 2.2 Width: (cm) 4.5 Depth: (cm) 0.4 Area: (cm) 7.775 Volume: (cm) 3.11 % Reduction in Area: 68.6% % Reduction in Volume: -25.7% Epithelialization: None Tunneling: No Undermining: No Wound Description Classification: Full Thickness Without Exposed Support Structu Exudate Amount: Medium Exudate Type: Serosanguineous Exudate Color: red, brown res Foul Odor After Cleansing: No Slough/Fibrino Yes Wound Bed Granulation Amount: Large (67-100%) Exposed Structure Granulation Quality: Red, Pink Fascia Exposed: No Necrotic Amount: Small (1-33%) Fat Layer (Subcutaneous Tissue) Exposed: Yes Necrotic Quality: Adherent Slough Tendon Exposed: No Muscle Exposed: No Joint Exposed: No Bone Exposed: No Electronic Signature(s) Signed: 06/23/2020 8:33:59 AM By: Carlene Coria RN Entered By: Carlene Coria on 06/12/2020 15:32:20 Berent, Guy Sandifer ([id number]) -------------------------------------------------------------------------------- Wound Assessment Details Patient Name: LAYTEN, AIKEN. Date of Service: 06/12/2020 2:45 PM Medical Record Number: [id number] Patient Account Number: [id number] Date of Birth/Sex: February 20, 1950 (70 y.o. M) Treating RN: Carlene Coria Primary Care Jamarcus Laduke: Coletta Memos, DAVID Other Clinician: Referring Carrine Kroboth: Coletta Memos, DAVID Treating Harmonie Verrastro/Extender: Skipper Cliche in Treatment: 1 Wound Status Wound Number: 2 Primary Lymphedema Etiology: Wound Location: Right, Midline, Anterior Lower Leg Wound Open Wounding Event: Shear/Friction Status: Date Acquired: 04/08/2020 Comorbid Sleep Apnea, Arrhythmia, Congestive Heart Failure, Weeks Of Treatment: 1 History: Deep Vein Thrombosis, Osteoarthritis Clustered Wound: Yes Photos Wound Measurements Length: (cm) 12 Width: (cm) 10 Depth: (cm) 0.1 Area: (cm) 94.248 Volume: (cm) 9.425 % Reduction in Area: -122.2% % Reduction in Volume: -122.2% Epithelialization: None Tunneling: No Undermining:  No Wound Description Classification: Full Thickness Without Exposed Support Structu Exudate Amount: Medium Exudate Type: Serosanguineous Exudate Color: red, brown res Foul Odor After Cleansing: No Slough/Fibrino Yes Wound Bed Granulation Amount: Large (67-100%) Exposed Structure Granulation Quality: Red, Pink Fascia Exposed: No Necrotic Amount: Small (1-33%) Fat Layer (Subcutaneous Tissue) Exposed: Yes Necrotic Quality: Adherent Slough Tendon Exposed: No Muscle Exposed: No Joint Exposed: No Bone Exposed: No Electronic Signature(s) Signed: 06/23/2020 8:33:59 AM By: Carlene Coria RN Entered By: Carlene Coria on 06/12/2020 15:32:49 Reier, Guy Sandifer ([id number]) -------------------------------------------------------------------------------- Vitals Details Patient Name: JARRIUS, HUARACHA. Date of Service: 06/12/2020 2:45 PM Medical Record Number: [id number] Patient Account Number: [id number] Date of Birth/Sex: 1950/05/07 (70 y.o. M) Treating RN: Carlene Coria Primary Care Lenis Nettleton: Coletta Memos, DAVID Other Clinician: Referring Tamarah Bhullar: Coletta Memos, DAVID Treating Jaria Conway/Extender: Skipper Cliche in Treatment: 1 Vital Signs Time Taken: 15:30 Temperature (F): 97.8 Height (in): 68 Pulse (bpm): 67 Weight (lbs): 365 Respiratory Rate (breaths/min): 18 Body Mass Index (BMI): 55.5 Blood Pressure (mmHg): 100/68 Reference Range: 80 - 120 mg / dl Electronic Signature(s) Signed: 06/23/2020 8:33:59 AM By: Carlene Coria RN Entered By: Carlene Coria on 06/12/2020 15:30:43

## 2020-06-12 NOTE — Progress Notes (Addendum)
ODAI, WIMMER ([id number]) Visit Report for 06/12/2020 Chief Complaint Document Details Patient Name: [name], [name]. Date of Service: 06/12/2020 2:45 PM Medical Record Number: [id number] Patient Account Number: [id number] Date of Birth/Sex: 09-20-1950 (70 y.o. M) Treating RN: Dolan Amen Primary Care Provider: Bernerd Limbo Other Clinician: Referring Provider: Coletta Memos, DAVID Treating Provider/Extender: Skipper Cliche in Treatment: 1 Information Obtained from: Patient Chief Complaint 06/05/2020; patient is here for review of wounds on the right lower leg Electronic Signature(s) Signed: 06/12/2020 3:08:38 PM By: Worthy Keeler PA-C Entered By: Worthy Keeler on 06/12/2020 15:08:38 [name], [name] ([id number]) -------------------------------------------------------------------------------- HPI Details Patient Name: [name], [name]. Date of Service: 06/12/2020 2:45 PM Medical Record Number: [id number] Patient Account Number: [id number] Date of Birth/Sex: Feb 23, 1950 (70 y.o. 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 He is wheelchair dependent but can transfer himself. He was seen in the ER on 04/24/2020 with right leg cellulitis given doxycycline. He was back in the ER on 05/27/2020 with stasis dermatitis however they went ahead and gave him a course of Keflex. He says his wounds started sometime in March where he had wheelchair trauma with another resident. This may have happened twice. He has [facility] where not exactly sure what they are putting  on the wound but it is probably collagen. He denies any prior history of pelvic trauma, radiation, malignancy etc. I am not able to really get a history of how long the amount of edema in his right leg is been present. Past medical history includes left above-knee amputation, congestive heart failure, hypothyroidism, history of MRSA, A. fib on Xarelto. Patient claims he is not a diabetic but there is some parts of his assisted living records that suggest he has diabetes although his last hemoglobin A1c that I can see was not indicative. ABIs were noncompressible 06/12/2020 upon evaluation today patient appears to be doing well about the same in regard to his wounds. He probably has a little bit of improvement based on what I am seeing done little bit more epithelialized around some of the edges but he still has a lot of drainage. Fortunately home health is coming out which is great news. There is no sign of active infection at this time which is great news. Electronic Signature(s) Signed: 06/12/2020 3:58:52 PM By: Worthy Keeler PA-C Entered By: Worthy Keeler on 06/12/2020 15:58:52 [name], [name] ([id number]) -------------------------------------------------------------------------------- Physical Exam Details Patient Name: [name], [name]. Date of Service: 06/12/2020 2:45 PM Medical Record Number: [id number] Patient Account Number: [id number] Date of Birth/Sex: 10-07-1950 (70 y.o. M) Treating RN: Dolan Amen Primary Care Provider: Bernerd Limbo Other Clinician: Referring Provider: Coletta Memos, DAVID Treating Provider/Extender: Skipper Cliche in Treatment: 1 Constitutional Obese and well-hydrated in no acute distress. Respiratory normal breathing without difficulty. Psychiatric this patient is able to make decisions and demonstrates good insight into disease process. Alert and Oriented x 3. pleasant and cooperative. Notes Patient's wound bed showed evidence of again continued drainage  although I think he is doing a good job with the compression wraps I think this does need to be changed on  a regular basis the good news is home health is coming out and taking care of this now. Overall I am extremely pleased with where he stands. Electronic Signature(s) Signed: 06/12/2020 3:59:10 PM By: Lenda Kelp PA-C Entered By: Lenda Kelp on 06/12/2020 15:59:10 Buchbinder, Rex Kras ([id number]) -------------------------------------------------------------------------------- Physician Orders Details Patient Name: [name], [name]. Date of Service: 06/12/2020 2:45 PM Medical Record Number: [id number] Patient Account Number: [id number] Date of Birth/Sex: [birth year] (70 y.o. M) Treating RN: Rogers Blocker Primary Care Provider: Everlene Other, DAVID Other Clinician: Referring Provider: Everlene Other, DAVID Treating Provider/Extender: Rowan Blase in Treatment: 1 Verbal / Phone Orders: No Diagnosis Coding ICD-10 Coding Code Description I89.0 Lymphedema, not elsewhere classified I87.331 Chronic venous hypertension (idiopathic) with ulcer and inflammation of right lower extremity L97.818 Non-pressure chronic ulcer of other part of right lower leg with other specified severity Follow-up Appointments o Return Appointment in 2 weeks. Home Health o Home Health Company: - [facility] o [facility] for wound care. May utilize formulary equivalent dressing for wound treatment orders unless otherwise specified. Home Health Nurse may visit PRN to address patientos wound care needs. o Scheduled days for dressing changes to be completed; exception, patient has scheduled wound care visit that day. - Tuesday and Thursday-patient will not be seen in office for 2 weeks Edema Control - Lymphedema / Segmental Compressive Device / Other o Elevate legs to the level of the heart and pump ankles as often as possible o Elevate leg(s) parallel to the floor when sitting. Wound Treatment Wound  #1 - Lower Leg Wound Laterality: Right, Posterior, Distal Cleanser: Soap and Water 2 x Per Week/15 Days Discharge Instructions: Gently cleanse wound with antibacterial soap, rinse and pat dry prior to dressing wounds Peri-Wound Care: Triamcinolone Acetonide Cream, 0.1%, 15 (g) tube 2 x Per Week/15 Days Primary Dressing: Silvercel 4 1/4x 4 1/4 (in/in) 2 x Per Week/15 Days Discharge Instructions: Apply Silvercel 4 1/4x 4 1/4 (in/in) as instructed Secondary Dressing: ABD Pad 5x9 (in/in) 2 x Per Week/15 Days Discharge Instructions: Cover with ABD pad Compression Wrap: Profore Lite LF 3 Multilayer Compression Bandaging System 2 x Per Week/15 Days Discharge Instructions: Apply 3 multi-layer wrap as prescribed. Wound #2 - Lower Leg Wound Laterality: Right, Midline, Anterior Cleanser: Soap and Water 2 x Per Week/15 Days Discharge Instructions: Gently cleanse wound with antibacterial soap, rinse and pat dry prior to dressing wounds Peri-Wound Care: Triamcinolone Acetonide Cream, 0.1%, 15 (g) tube 2 x Per Week/15 Days Primary Dressing: Silvercel 4 1/4x 4 1/4 (in/in) 2 x Per Week/15 Days Discharge Instructions: Apply Silvercel 4 1/4x 4 1/4 (in/in) as instructed Secondary Dressing: ABD Pad 5x9 (in/in) 2 x Per Week/15 Days Discharge Instructions: Cover with ABD pad Compression Wrap: Profore Lite LF 3 Multilayer Compression Bandaging System 2 x Per Week/15 Days Discharge Instructions: Apply 3 multi-layer wrap as prescribed. [name], [name] ([id number]) Electronic Signature(s) Signed: 06/12/2020 5:11:22 PM By: Lajean Manes RN Signed: 06/13/2020 6:22:42 PM By: Lenda Kelp PA-C Entered By: Phillis Haggis, Dondra Prader on 06/12/2020 15:58:40 [name], [name] ([id number]) -------------------------------------------------------------------------------- Problem List Details Patient Name: [name], [name]. Date of Service: 06/12/2020 2:45 PM Medical Record Number: [id number] Patient Account Number:  [id number] Date of Birth/Sex: Jul 06, 1950 (70 y.o. M) Treating RN: Rogers Blocker Primary Care Provider: Tracey Harries Other Clinician: Referring Provider: Everlene Other, DAVID Treating Provider/Extender: Rowan Blase in Treatment: 1 Active Problems ICD-10 Encounter Code Description Active Date MDM Diagnosis I89.0 Lymphedema, not elsewhere classified 06/05/2020 No Yes  I62.703 Chronic venous hypertension (idiopathic) with ulcer and inflammation of 06/05/2020 No Yes right lower extremity L97.818 Non-pressure chronic ulcer of other part of right lower leg with other 06/05/2020 No Yes specified severity Inactive Problems Resolved Problems Electronic Signature(s) Signed: 06/12/2020 3:08:33 PM By: Worthy Keeler PA-C Entered By: Worthy Keeler on 06/12/2020 15:08:32 [name], [name] ([id number]) -------------------------------------------------------------------------------- Progress Note Details Patient Name: [name], [name]. Date of Service: 06/12/2020 2:45 PM Medical Record Number: [id number] Patient Account Number: [id number] Date of Birth/Sex: [birth year] (70 y.o. 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 He is wheelchair dependent but can transfer himself. He was seen in the ER on 04/24/2020 with right leg cellulitis given doxycycline. He was back in the ER on 05/27/2020 with stasis dermatitis however they went ahead and gave him a  course of Keflex. He says his wounds started sometime in March where he had wheelchair trauma with another resident. This may have happened twice. He has [facility] where not exactly sure what they are putting on the wound but it is probably collagen. He denies any prior history of pelvic trauma, radiation, malignancy etc. I am not able to really get a history of how long the amount of edema in his right leg is been present. Past medical history includes left above-knee amputation, congestive heart failure, hypothyroidism, history of MRSA, A. fib on Xarelto. Patient claims he is not a diabetic but there is some parts of his assisted living records that suggest he has diabetes although his last hemoglobin A1c that I can see was not indicative. ABIs were noncompressible 06/12/2020 upon evaluation today patient appears to be doing well about the same in regard to his wounds. He probably has a little bit of improvement based on what I am seeing done little bit more epithelialized around some of the edges but he still has a lot of drainage. Fortunately home health is coming out which is great news. There is no sign of active infection at this time which is great news. Objective Constitutional Obese and well-hydrated in no acute distress. Vitals Time Taken: 3:30 PM, Height: 68 in, Weight: 365 lbs, BMI: 55.5, Temperature: 97.8 F, Pulse: 67 bpm, Respiratory Rate: 18 breaths/min, Blood Pressure: 100/68 mmHg. Respiratory normal breathing without difficulty. Psychiatric this patient is able to make decisions and demonstrates good insight into disease process. Alert and Oriented x 3. pleasant and cooperative. General Notes: Patient's wound bed showed evidence of again continued drainage although I think he is doing a good job with the compression wraps I think this does need to be changed on a regular basis the good news is home health is coming out and taking care of this now. Overall I am  extremely pleased with where he stands. Integumentary (Hair, Skin) Wound #1 status is Open. Original cause of wound was Shear/Friction. The date acquired was: 04/08/2020. The wound has been in treatment 1 weeks. The wound is located on the Right,Distal,Posterior Lower Leg. The wound measures 2.2cm length x  4.5cm width x 0.4cm depth; 7.775cm^2 area and 3.11cm^3 volume. There is Fat Layer (Subcutaneous Tissue) exposed. There is no tunneling or undermining noted. There is a medium amount of serosanguineous drainage noted. There is large (67-100%) red, pink granulation within the wound bed. There is a small (1-33%) amount of necrotic tissue within the wound bed including Adherent Slough. Wound #2 status is Open. Original cause of wound was Shear/Friction. The date acquired was: 04/08/2020. The wound has been in treatment 1 weeks. The wound is located on the Right,Midline,Anterior Lower Leg. The wound measures 12cm length x 10cm width x 0.1cm depth; Orchard, Giles J. ([id number]) 94.248cm^2 area and 9.425cm^3 volume. There is Fat Layer (Subcutaneous Tissue) exposed. There is no tunneling or undermining noted. There is a medium amount of serosanguineous drainage noted. There is large (67-100%) red, pink granulation within the wound bed. There is a small (1-33%) amount of necrotic tissue within the wound bed including Adherent Slough. 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 There was a Three Layer Compression Therapy Procedure by Dolan Amen, RN. Post procedure Diagnosis Wound #1: Same as Pre-Procedure Notes: pt tolerating wrap well. Wound #2 Pre-procedure diagnosis of  Wound #2 is a Lymphedema located on the Right,Midline,Anterior Lower Leg . There was a Three Layer Compression Therapy Procedure by Dolan Amen, RN. Post procedure Diagnosis Wound #2: Same as Pre-Procedure Notes: pt tolerating wrap well. Plan Follow-up Appointments: Return Appointment in 2 weeks. Home Health: Hom 1. I would recommend that we continue with the wound care measures as before including the silver alginate dressing I think that is a good option. 2. I am also can recommend that we have the patient continue with the 3 layer compression wrap. We will see how things do along with the ABD pad. If he still continues to drain significantly we may need to increase this to a 4-layer wrap possibly even initiating XtraSorb. We will see patient back for reevaluation in 1 week here in the clinic. If anything worsens or changes patient will contact our office for additional recommendations. [place]: - [facility] for wound care. May utilize formulary equivalent dressing for wound treatment orders unless otherwise specified. Home Health Nurse may visit PRN to address patient s wound care needs. Scheduled days for dressing changes to be completed; exception, patient has scheduled wound care visit that day. - Tuesday and Thursday- patient will not be seen in office for 2 weeks Edema Control - Lymphedema / Segmental Compressive Device / Other: Elevate legs to the level of the heart and pump ankles as often as possible Elevate leg(s) parallel to the floor when sitting. WOUND #1: - Lower Leg Wound Laterality: Right, Posterior, Distal Cleanser: Soap and Water 2 x Per Week/15 Days Discharge Instructions: Gently cleanse wound with antibacterial soap, rinse and pat dry prior to dressing wounds Peri-Wound Care: Triamcinolone Acetonide Cream, 0.1%, 15 (g) tube 2 x Per Week/15 Days Primary Dressing: Silvercel 4 1/4x 4 1/4 (in/in) 2 x Per Week/15 Days Discharge  Instructions: Apply Silvercel 4 1/4x 4 1/4 (in/in) as instructed Secondary Dressing: ABD Pad 5x9 (in/in) 2 x Per Week/15 Days Discharge Instructions: Cover with ABD pad Compression Wrap: Profore Lite LF 3 Multilayer Compression Bandaging  System 2 x Per Week/15 Days Discharge Instructions: Apply 3 multi-layer wrap as prescribed. WOUND #2: - Lower Leg Wound Laterality: Right, Midline, Anterior Cleanser: Soap and Water 2 x Per Week/15 Days Discharge Instructions: Gently cleanse wound with antibacterial soap, rinse and pat dry prior to dressing wounds [name], SHEDD ([id number]) Peri-Wound Care: Triamcinolone Acetonide Cream, 0.1%, 15 (g) tube 2 x Per Week/15 Days Primary Dressing: Silvercel 4 1/4x 4 1/4 (in/in) 2 x Per Week/15 Days Discharge Instructions: Apply Silvercel 4 1/4x 4 1/4 (in/in) as instructed Secondary Dressing: ABD Pad 5x9 (in/in) 2 x Per Week/15 Days Discharge Instructions: Cover with ABD pad Compression Wrap: Profore Lite LF 3 Multilayer Compression Bandaging System 2 x Per Week/15 Days Discharge Instructions: Apply 3 multi-layer wrap as prescribed. 1. I would recommend that we continue with the wound care measures as before including the silver alginate dressing I think that is a good option. 2. I am also can recommend that we have the patient continue with the 3 layer compression wrap. We will see how things do along with the ABD pad. If he still continues to drain significantly we may need to increase this to a 4-layer wrap possibly even initiating XtraSorb. We will see patient back for reevaluation in 2 weeks here in the clinic. If anything worsens or changes patient will contact our office for additional recommendations. Electronic Signature(s) Signed: 06/12/2020 3:59:45 PM By: Worthy Keeler PA-C Entered By: Worthy Keeler on 06/12/2020 15:59:45 Domzalski, Guy Sandifer ([id number]) -------------------------------------------------------------------------------- SuperBill  Details Patient Name: JOSEL, KEO. Date of Service: 06/12/2020 Medical Record Number: [id number] Patient Account Number: [id number] Date of Birth/Sex: 02-21-1950 (70 y.o. M) Treating RN: Dolan Amen Primary Care Provider: Bernerd Limbo Other Clinician: Referring Provider: Coletta Memos, DAVID Treating Provider/Extender: Skipper Cliche in Treatment: 1 Diagnosis Coding ICD-10 Codes Code Description I89.0 Lymphedema, not elsewhere classified I87.331 Chronic venous hypertension (idiopathic) with ulcer and inflammation of right lower extremity L97.818 Non-pressure chronic ulcer of other part of right lower leg with other specified severity Facility Procedures CPT4 Code: [id number] Description: (Facility Use Only) [phone] - [facility] RT LEG Modifier: Quantity: 1 Physician Procedures CPT4 Code Description: [id number] - WC PHYS LEVEL 3 - EST PT Modifier: Quantity: 1 CPT4 Code Description: ICD-10 Diagnosis Description I89.0 Lymphedema, not elsewhere classified I87.331 Chronic venous hypertension (idiopathic) with ulcer and inflammation of righ L97.818 Non-pressure chronic ulcer of other part of right lower leg with  other speci Modifier: t lower extremit fied severity Quantity: y Engineer, maintenance) Signed: 06/12/2020 3:59:56 PM By: Worthy Keeler PA-C Entered By: Worthy Keeler on 06/12/2020 15:59:56

## 2020-06-26 ENCOUNTER — Other Ambulatory Visit: Payer: Self-pay

## 2020-06-26 ENCOUNTER — Encounter: Payer: Medicare Other | Admitting: Physician Assistant

## 2020-06-26 DIAGNOSIS — I87331 Chronic venous hypertension (idiopathic) with ulcer and inflammation of right lower extremity: Secondary | ICD-10-CM | POA: Diagnosis not present

## 2020-06-26 NOTE — Progress Notes (Addendum)
SUSHANTH, DIGANGI ([id number]) Visit Report for 06/26/2020 Chief Complaint Document Details Patient Name: LAQUINCY, BRY. Date of Service: 06/26/2020 1:30 PM Medical Record Number: [id number] Patient Account Number: [id number] Date of Birth/Sex: [birth year] (70 y.o. M) Treating RN: Dolan Amen Primary Care Provider: Bernerd Limbo Other Clinician: Referring Provider: Bernerd Limbo Treating Provider/Extender: Skipper Cliche in Treatment: 3 Information Obtained from: Patient Chief Complaint 06/05/2020; patient is here for review of wounds on the right lower leg Electronic Signature(s) Signed: 06/26/2020 2:03:36 PM By: Worthy Keeler PA-C Entered By: Worthy Keeler on 06/26/2020 14:03:36 Gauss, Guy Sandifer ([id number]) -------------------------------------------------------------------------------- HPI Details Patient Name: WINSOR, ESS. Date of Service: 06/26/2020 1:30 PM Medical Record Number: [id number] Patient Account Number: [id number] Date of Birth/Sex: 1950-09-25 (70 y.o. 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 He is wheelchair dependent but can transfer himself. He was seen in the ER on 04/24/2020 with right leg cellulitis given doxycycline. He was back in the ER on 05/27/2020 with stasis dermatitis however they went ahead and gave him a course of Keflex. He says his wounds started sometime in March where he had wheelchair trauma with another resident. This may have happened twice. He has [facility] where not exactly sure what they are  putting on the wound but it is probably collagen. He denies any prior history of pelvic trauma, radiation, malignancy etc. I am not able to really get a history of how long the amount of edema in his right leg is been present. Past medical history includes left above-knee amputation, congestive heart failure, hypothyroidism, history of MRSA, A. fib on Xarelto. Patient claims he is not a diabetic but there is some parts of his assisted living records that suggest he has diabetes although his last hemoglobin A1c that I can see was not indicative. ABIs were noncompressible 06/12/2020 upon evaluation today patient appears to be doing well about the same in regard to his wounds. He probably has a little bit of improvement based on what I am seeing done little bit more epithelialized around some of the edges but he still has a lot of drainage. Fortunately home health is coming out which is great news. There is no sign of active infection at this time which is great news. 06/26/2020 upon evaluation today patient appears to be doing well with regard to his leg ulcer. In fact the anterior is healed the posterior is smaller. I think we are headed in the correct direction. Electronic Signature(s) Signed: 06/26/2020 2:15:10 PM By: Worthy Keeler PA-C Entered By: Worthy Keeler on 06/26/2020 14:15:09 TORRANCE, HON ([id number]) -------------------------------------------------------------------------------- Physical Exam Details Patient Name: ENRI, CHUTE. Date of Service: 06/26/2020 1:30 PM Medical Record Number: [id number] Patient Account Number: [id number] Date of Birth/Sex: 12/04/1950 (70 y.o. M) Treating RN: Dolan Amen Primary Care Provider: Bernerd Limbo Other Clinician: Referring Provider: Bernerd Limbo Treating Provider/Extender: Skipper Cliche in Treatment: 3 Constitutional Well-nourished and well-hydrated in no acute distress. Respiratory normal breathing without  difficulty. Psychiatric this patient is able to make decisions and demonstrates good insight into disease process. Alert and Oriented x 3.  pleasant and cooperative. Notes Patient's wounds again are showing signs of excellent improvement and overall very pleased with the progress he is made and I do not see any signs of infection at this time. No fever chills noted. Overall I think the patient is making excellent progress. Electronic Signature(s) Signed: 06/26/2020 2:17:35 PM By: Worthy Keeler PA-C Entered By: Worthy Keeler on 06/26/2020 14:17:34 Panek, Guy Sandifer ([id number]) -------------------------------------------------------------------------------- Physician Orders Details Patient Name: SEDALE, JENIFER. Date of Service: 06/26/2020 1:30 PM Medical Record Number: [id number] Patient Account Number: [id number] Date of Birth/Sex: [birth year] (70 y.o. M) Treating RN: Dolan Amen Primary Care Provider: Bernerd Limbo Other Clinician: Referring Provider: Bernerd Limbo Treating Provider/Extender: Skipper Cliche in Treatment: 3 Verbal / Phone Orders: No Diagnosis Coding ICD-10 Coding Code Description I89.0 Lymphedema, not elsewhere classified I87.331 Chronic venous hypertension (idiopathic) with ulcer and inflammation of right lower extremity L97.818 Non-pressure chronic ulcer of other part of right lower leg with other specified severity Follow-up Appointments o Return Appointment in 1 week. [place]: - [facility] [place] [facility] for wound care. May utilize formulary equivalent dressing for wound treatment orders unless otherwise specified. Home Health Nurse may visit PRN to address patientos wound care needs. o Scheduled days for dressing changes to be completed; exception, patient has scheduled wound care visit that day. - Monday or Tuesday-pt seen in office Thursdays Edema Control - Lymphedema / Segmental Compressive Device /  Other o Optional: One layer of unna paste to top of compression wrap (to act as an anchor). - Apply unna paste as anchor 2 cm below knee bend o Elevate legs to the level of the heart and pump ankles as often as possible o Elevate leg(s) parallel to the floor when sitting. Wound Treatment Wound #1 - Lower Leg Wound Laterality: Right, Posterior, Distal Cleanser: Soap and Water 2 x Per Week/15 Days Discharge Instructions: Gently cleanse wound with antibacterial soap, rinse and pat dry prior to dressing wounds Peri-Wound Care: Triamcinolone Acetonide Cream, 0.1%, 15 (g) tube 2 x Per Week/15 Days Discharge Instructions: On legs Primary Dressing: Silvercel 4 1/4x 4 1/4 (in/in) 2 x Per Week/15 Days Discharge Instructions: Apply Silvercel 4 1/4x 4 1/4 (in/in) as instructed Secondary Dressing: ABD Pad 5x9 (in/in) 2 x Per Week/15 Days Discharge Instructions: Cover with ABD pad Compression Wrap: Profore Lite LF 3 Multilayer Compression Bandaging System 2 x Per Week/15 Days Discharge Instructions: Apply 3 multi-layer wrap as prescribed. Electronic Signature(s) Signed: 06/26/2020 5:03:18 PM By: Worthy Keeler PA-C Signed: 06/26/2020 5:24:26 PM By: Georges Mouse, Minus Breeding RN Entered By: Georges Mouse, Minus Breeding on 06/26/2020 14:14:37 Rathke, Guy Sandifer ([id number]) -------------------------------------------------------------------------------- Problem List Details Patient Name: MACK, ALVIDREZ. Date of Service: 06/26/2020 1:30 PM Medical Record Number: [id number] Patient Account Number: [id number] Date of Birth/Sex: 1950-11-08 (70 y.o. M) Treating RN: Dolan Amen Primary Care Provider: Bernerd Limbo Other Clinician: Referring Provider: Bernerd Limbo Treating Provider/Extender: Skipper Cliche in Treatment: 3 Active Problems ICD-10 Encounter Code Description Active Date MDM Diagnosis I89.0 Lymphedema, not elsewhere classified 06/05/2020 No Yes I87.331 Chronic venous hypertension (idiopathic)  with ulcer and inflammation of 06/05/2020 No Yes right lower extremity L97.818 Non-pressure chronic ulcer of other part of right lower leg with other 06/05/2020 No Yes specified severity Inactive Problems Resolved Problems Electronic Signature(s) Signed: 06/26/2020 2:03:30 PM By: Worthy Keeler PA-C Entered By: Worthy Keeler on 06/26/2020 14:03:30 Gremillion, Guy Sandifer ([id number]) -------------------------------------------------------------------------------- Progress Note Details Patient Name: Annabelle Harman  J. Date of Service: 06/26/2020 1:30 PM Medical Record Number: [id number] Patient Account Number: [id number] Date of Birth/Sex: October 17, 1950 (70 y.o. 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 He is wheelchair dependent but can transfer himself. He was seen in the ER on 04/24/2020 with right leg cellulitis given doxycycline. He was back in the ER on 05/27/2020 with stasis dermatitis however they went ahead and gave him a course of Keflex. He says his wounds started sometime in March where he had wheelchair trauma with another resident. This may have happened twice. He has [facility] home [facility] where not exactly sure what they are putting on the wound but it is probably collagen. He denies any prior history of pelvic trauma, radiation, malignancy etc. I am not able to really get a history of how long the amount of edema in his right leg is  been present. Past medical history includes left above-knee amputation, congestive heart failure, hypothyroidism, history of MRSA, A. fib on Xarelto. Patient claims he is not a diabetic but there is some parts of his assisted living records that suggest he has diabetes although his last hemoglobin A1c that I can see was not indicative. ABIs were noncompressible 06/12/2020 upon evaluation today patient appears to be doing well about the same in regard to his wounds. He probably has a little bit of improvement based on what I am seeing done little bit more epithelialized around some of the edges but he still has a lot of drainage. Fortunately home health is coming out which is great news. There is no sign of active infection at this time which is great news. 06/26/2020 upon evaluation today patient appears to be doing well with regard to his leg ulcer. In fact the anterior is healed the posterior is smaller. I think we are headed in the correct direction. Objective Constitutional Well-nourished and well-hydrated in no acute distress. Vitals Time Taken: 1:51 PM, Height: 68 in, Weight: 365 lbs, BMI: 55.5, Temperature: 98 F, Pulse: 71 bpm, Respiratory Rate: 20 breaths/min, Blood Pressure: 102/70 mmHg. Respiratory normal breathing without difficulty. Psychiatric this patient is able to make decisions and demonstrates good insight into disease process. Alert and Oriented x 3. pleasant and cooperative. General Notes: Patient's wounds again are showing signs of excellent improvement and overall very pleased with the progress he is made and I do not see any signs of infection at this time. No fever chills noted. Overall I think the patient is making excellent progress. Integumentary (Hair, Skin) Wound #1 status is Open. Original cause of wound was Shear/Friction. The date acquired was: 04/08/2020. The wound has been in treatment 3 weeks. The wound is located on the Right,Distal,Posterior Lower Leg. The  wound measures 1cm length x 1cm width x 0.1cm depth; 0.785cm^2 area and 0.079cm^3 volume. There is Fat Layer (Subcutaneous Tissue) exposed. There is no tunneling or undermining noted. There is a medium amount of serosanguineous drainage noted. There is large (67-100%) red, pink granulation within the wound bed. There is no necrotic  tissue within the wound bed. BENY, BURAS ([id number]) Wound #2 status is Healed - Epithelialized. Original cause of wound was Shear/Friction. The date acquired was: 04/08/2020. The wound has been in treatment 3 weeks. The wound is located on the Right,Midline,Anterior Lower Leg. The wound measures 0cm length x 0cm width x 0cm depth; 0cm^2 area and 0cm^3 volume. There is no tunneling or undermining noted. There is a none present amount of drainage noted. There is no granulation within the wound bed. There is no necrotic tissue within the wound bed. 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 There was a Three Layer Compression Therapy Procedure by Dolan Amen, RN. Post procedure Diagnosis Wound #1: Same as Pre-Procedure Notes: pt tolerating wrap well. Plan Follow-up Appointments: Return Appointment in 1 week. Home Health: [place]: - [facility] [place] for wound care. May utilize formulary equivalent dressing for wound treatment orders unless otherwise specified. Home Health Nurse may visit PRN to address patient s wound care needs. Scheduled days for dressing changes to be completed; exception, patient has scheduled wound care visit that day. - Monday or Tuesday-pt seen in office Thursdays Edema  Control - Lymphedema / Segmental Compressive Device / Other: Optional: One layer of unna paste to top of compression wrap (to act as an anchor). - Apply unna paste as anchor 2 cm below knee bend Elevate legs to the level of the heart and pump ankles as often as possible Elevate leg(s) parallel to the floor when sitting. WOUND #1: - Lower Leg Wound Laterality: Right, Posterior, Distal Cleanser: Soap and Water 2 x Per Week/15 Days Discharge Instructions: Gently cleanse wound with antibacterial soap, rinse and pat dry prior to dressing wounds Peri-Wound Care: Triamcinolone Acetonide Cream, 0.1%, 15 (g) tube 2 x Per Week/15 Days Discharge Instructions: On legs Primary Dressing: Silvercel 4 1/4x 4 1/4 (in/in) 2 x Per Week/15 Days Discharge Instructions: Apply Silvercel 4 1/4x 4 1/4 (in/in) as instructed Secondary Dressing: ABD Pad 5x9 (in/in) 2 x Per Week/15 Days Discharge Instructions: Cover with ABD pad Compression Wrap: Profore Lite LF 3 Multilayer Compression Bandaging System 2 x Per Week/15 Days Discharge Instructions: Apply 3 multi-layer wrap as prescribed. 1. Would recommend that we going to continue with the wound care measures as before specifically with regard to the silver cell which I think is doing a great job. 2. I am also can recommend the ABD pad to cover. 3. we will continue with 3 layer compression wrap we do need to have an owner to anchor and we will send this back to home health to make sure that he is not as well to keep it from sliding down. We will see patient back for reevaluation in 1 week here in the clinic. If anything worsens or changes patient will contact our office for additional recommendations. Electronic Signature(s) JAQUAWN, KEEF ([id number]) Signed: 06/26/2020 2:18:28 PM By: Worthy Keeler PA-C Entered By: Worthy Keeler on 06/26/2020 14:18:28 KERON, [place]  ([id number]) -------------------------------------------------------------------------------- SuperBill Details Patient Name: GRANDERSON, FLADGER. Date of Service: 06/26/2020 Medical Record Number: [id number] Patient Account Number: [id number] Date of Birth/Sex: 05/06/1950 (70 y.o. M) Treating RN: Dolan Amen Primary Care Provider: Bernerd Limbo Other Clinician: Referring Provider:  Bernerd Limbo Treating Provider/Extender: Jeri Cos Weeks in Treatment: 3 Diagnosis Coding ICD-10 Codes Code Description I89.0 Lymphedema, not elsewhere classified I87.331 Chronic venous hypertension (idiopathic) with ulcer and inflammation of right lower extremity L97.818 Non-pressure chronic ulcer of other part of right lower leg with other specified severity Facility Procedures CPT4 Code: [id number] Description: (Facility Use Only) [phone] - APPLY MULTLAY COMPRS LWR RT LEG Modifier: Quantity: 1 Physician Procedures CPT4 Code Description: [id number] - WC PHYS LEVEL 3 - EST PT Modifier: Quantity: 1 CPT4 Code Description: ICD-10 Diagnosis Description I89.0 Lymphedema, not elsewhere classified I87.331 Chronic venous hypertension (idiopathic) with ulcer and inflammation of righ L97.818 Non-pressure chronic ulcer of other part of right lower leg with  other speci Modifier: t lower extremit fied severity Quantity: y Engineer, maintenance) Signed: 06/26/2020 2:18:39 PM By: Worthy Keeler PA-C Entered By: Worthy Keeler on 06/26/2020 14:18:39

## 2020-06-30 NOTE — Progress Notes (Signed)
ONEY, FOLZ ([id number]) Visit Report for 06/26/2020 Arrival Information Details Patient Name: [name], [name]. Date of Service: 06/26/2020 1:30 PM Medical Record Number: [id number] Patient Account Number: [id number] Date of Birth/Sex: [birth year] (70 y.o. M) Treating RN: Carlene Coria Primary Care Shanise Balch: Bernerd Limbo Other Clinician: Referring Mauria Asquith: Bernerd Limbo Treating Hasnain Manheim/Extender: Skipper Cliche in Treatment: 3 Visit Information History Since Last Visit All ordered tests and consults were completed: No Patient Arrived: Wheel Chair Added or deleted any medications: No Arrival Time: 13:43 Any new allergies or adverse reactions: No Accompanied By: self Had a fall or experienced change in No Transfer Assistance: None activities of daily living that may affect Patient Identification Verified: Yes risk of falls: Secondary Verification Process Completed: Yes Signs or symptoms of abuse/neglect since last visito No Patient Requires Transmission-Based No Hospitalized since last visit: No Precautions: Implantable device outside of the clinic excluding No Patient Has Alerts: Yes cellular tissue based products placed in the center Patient Alerts: Patient on Blood Thinner since last visit: NOT diabetic Has Dressing in Place as Prescribed: Yes XARELTO Has Compression in Place as Prescribed: Yes Lives at [facility] Pain Present Now: No ABI non compressible Rleg Left AKA Electronic Signature(s) Signed: 06/30/2020 2:18:56 PM By: Carlene Coria RN Entered By: Carlene Coria on 06/26/2020 13:51:08 Burak, Guy Sandifer ([id number]) -------------------------------------------------------------------------------- Clinic Level of Care Assessment Details Patient Name: [name], [name]. Date of Service: 06/26/2020 1:30 PM Medical Record Number: [id number] Patient Account Number: [id number] Date of Birth/Sex: 27-Jan-1951 (70 y.o. M) Treating RN: Dolan Amen Primary Care Carisha Kantor:  Bernerd Limbo Other Clinician: Referring Lousie Calico: Bernerd Limbo Treating Cuyler Vandyken/Extender: Skipper Cliche in Treatment: 3 Clinic Level of Care Assessment Items TOOL 1 Quantity Score '[]'  - Use when EandM and Procedure is performed on INITIAL visit 0 ASSESSMENTS - Nursing Assessment / Reassessment '[]'  - General Physical Exam (combine w/ comprehensive assessment (listed just below) when performed on new 0 pt. evals) '[]'  - 0 Comprehensive Assessment (HX, ROS, Risk Assessments, Wounds Hx, etc.) ASSESSMENTS - Wound and Skin Assessment / Reassessment '[]'  - Dermatologic / Skin Assessment (not related to wound area) 0 ASSESSMENTS - Ostomy and/or Continence Assessment and Care '[]'  - Incontinence Assessment and Management 0 '[]'  - 0 Ostomy Care Assessment and Management (repouching, etc.) PROCESS - Coordination of Care '[]'  - Simple Patient / Family Education for ongoing care 0 '[]'  - 0 Complex (extensive) Patient / Family Education for ongoing care '[]'  - 0 Staff obtains Programmer, systems, Records, Test Results / Process Orders '[]'  - 0 Staff telephones HHA, Nursing Homes / Clarify orders / etc '[]'  - 0 Routine Transfer to another Facility (non-emergent condition) '[]'  - 0 Routine Hospital Admission (non-emergent condition) '[]'  - 0 New Admissions / Biomedical engineer / Ordering NPWT, Apligraf, etc. '[]'  - 0 Emergency Hospital Admission (emergent condition) PROCESS - Special Needs '[]'  - Pediatric / Minor Patient Management 0 '[]'  - 0 Isolation Patient Management '[]'  - 0 Hearing / Language / Visual special needs '[]'  - 0 Assessment of Community assistance (transportation, D/C planning, etc.) '[]'  - 0 Additional assistance / Altered mentation '[]'  - 0 Support Surface(s) Assessment (bed, cushion, seat, etc.) INTERVENTIONS - Miscellaneous '[]'  - External ear exam 0 '[]'  - 0 Patient Transfer (multiple staff / Civil Service fast streamer / Similar devices) '[]'  - 0 Simple Staple / Suture removal (25 or less) '[]'  - 0 Complex Staple /  Suture removal (26 or more) '[]'  - 0 Hypo/Hyperglycemic Management (do not check if billed separately) '[]'  - 0 Ankle / Brachial  Index (ABI) - do not check if billed separately Has the patient been seen at the hospital within the last three years: Yes Total Score: 0 Level Of Care: ____ [name] ([id number]) Electronic Signature(s) Signed: 06/26/2020 5:24:26 PM By: Georges Mouse, Minus Breeding RN Entered By: Georges Mouse, Minus Breeding on 06/26/2020 14:14:42 Parekh, Guy Sandifer ([id number]) -------------------------------------------------------------------------------- Compression Therapy Details Patient Name: [name], [name]. Date of Service: 06/26/2020 1:30 PM Medical Record Number: [id number] Patient Account Number: [id number] Date of Birth/Sex: 01/09/1951 (70 y.o. M) Treating RN: Dolan Amen Primary Care Gerald Kuehl: Bernerd Limbo Other Clinician: Referring Haniah Penny: Bernerd Limbo Treating Madilynn Montante/Extender: Skipper Cliche in Treatment: 3 Compression Therapy Performed for Wound Assessment: Wound #1 Right,Distal,Posterior Lower Leg Performed By: Clinician Dolan Amen, RN Compression Type: Three Layer Post Procedure Diagnosis Same as Pre-procedure Notes pt tolerating wrap well Electronic Signature(s) Signed: 06/26/2020 5:24:26 PM By: Georges Mouse, Minus Breeding RN Entered By: Georges Mouse, Kenia on 06/26/2020 14:11:43 Birchall, Guy Sandifer ([id number]) -------------------------------------------------------------------------------- Encounter Discharge Information Details Patient Name: [name], [name]. Date of Service: 06/26/2020 1:30 PM Medical Record Number: [id number] Patient Account Number: [id number] Date of Birth/Sex: January 16, 1951 (70 y.o. M) Treating RN: Donnamarie Poag Primary Care Siani Utke: Bernerd Limbo Other Clinician: Referring Obed Samek: Bernerd Limbo Treating Shaylan Tutton/Extender: Skipper Cliche in Treatment: 3 Encounter Discharge Information Items Discharge Condition: Stable Ambulatory  Status: Wheelchair Discharge Destination: Other (Note Required) Telephoned: No Orders Sent: Yes Transportation: Other Accompanied By: self Schedule Follow-up Appointment: Yes Clinical Summary of Care: Electronic Signature(s) Signed: 06/26/2020 3:56:48 PM By: Donnamarie Poag Entered By: Donnamarie Poag on 06/26/2020 14:23:01 Tony, Guy Sandifer ([id number]) -------------------------------------------------------------------------------- Lower Extremity Assessment Details Patient Name: [name], [name]. Date of Service: 06/26/2020 1:30 PM Medical Record Number: [id number] Patient Account Number: [id number] Date of Birth/Sex: 11/22/1950 (70 y.o. M) Treating RN: Carlene Coria Primary Care Calixto Pavel: Bernerd Limbo Other Clinician: Referring Kortlynn Poust: Bernerd Limbo Treating Payal Stanforth/Extender: Skipper Cliche in Treatment: 3 Edema Assessment Assessed: Shirlyn Goltz: No] Patrice Paradise: Yes] Edema: [Left: Ye] [Right: s] Calf Left: Right: Point of Measurement: 34.5 cm From Medial Instep 55 cm Ankle Left: Right: Point of Measurement: 18 cm From Medial Instep 28 cm Vascular Assessment Pulses: Dorsalis Pedis Palpable: [Right:Yes] Electronic Signature(s) Signed: 06/30/2020 2:18:56 PM By: Carlene Coria RN Entered By: Carlene Coria on 06/26/2020 Burns, Guy Sandifer ([id number]) -------------------------------------------------------------------------------- Multi Wound Chart Details Patient Name: [name], [name]. Date of Service: 06/26/2020 1:30 PM Medical Record Number: [id number] Patient Account Number: [id number] Date of Birth/Sex: December 30, 1950 (70 y.o. M) Treating RN: Dolan Amen Primary Care Amiri Riechers: Bernerd Limbo Other Clinician: Referring Makell Drohan: Bernerd Limbo Treating Kaius Daino/Extender: Skipper Cliche in Treatment: 3 Vital Signs Height(in): 68 Pulse(bpm): 50 Weight(lbs): 365 Blood Pressure(mmHg): 102/70 Body Mass Index(BMI): 55 Temperature(F): 98 Respiratory Rate(breaths/min): 20 Photos:  [N/A:N/A] Wound Location: Right, Distal, Posterior Lower Leg Right, Midline, Anterior Lower Leg N/A Wounding Event: Shear/Friction Shear/Friction N/A Primary Etiology: Lymphedema Lymphedema N/A Comorbid History: Sleep Apnea, Arrhythmia, Sleep Apnea, Arrhythmia, N/A Congestive Heart Failure, Deep Vein Congestive Heart Failure, Deep Vein Thrombosis, Osteoarthritis Thrombosis, Osteoarthritis Date Acquired: 04/08/2020 04/08/2020 N/A Weeks of Treatment: 3 3 N/A Wound Status: Open Healed - Epithelialized N/A Clustered Wound: No Yes N/A Measurements L x W x D (cm) 1x1x0.1 0x0x0 N/A Area (cm) : 0.785 0 N/A Volume (cm) : 0.079 0 N/A % Reduction in Area: 96.80% 100.00% N/A % Reduction in Volume: 96.80% 100.00% N/A Classification: Full Thickness Without Exposed Full Thickness Without Exposed N/A Support Structures Support Structures Exudate Amount: Medium None Present N/A Exudate Type: Serosanguineous N/A N/A  Exudate Color: red, brown N/A N/A Granulation Amount: Large (67-100%) None Present (0%) N/A Granulation Quality: Red, Pink N/A N/A Necrotic Amount: None Present (0%) None Present (0%) N/A Exposed Structures: Fat Layer (Subcutaneous Tissue): Fascia: No N/A Yes Fat Layer (Subcutaneous Tissue): Fascia: No No Tendon: No Tendon: No Muscle: No Muscle: No Joint: No Joint: No Bone: No Bone: No Epithelialization: Medium (34-66%) Large (67-100%) N/A Treatment Notes Electronic Signature(s) Signed: 06/26/2020 5:24:26 PM By: Georges Mouse, Minus Breeding RN Entered By: Georges Mouse, Minus Breeding on 06/26/2020 14:11:15 Newlon, Guy Sandifer ([id number]) -------------------------------------------------------------------------------- [place] Details Patient Name: [name], [name]. Date of Service: 06/26/2020 1:30 PM Medical Record Number: [id number] Patient Account Number: [id number] Date of Birth/Sex: [birth year] (70 y.o. M) Treating RN: Dolan Amen Primary Care Karson Chicas: Bernerd Limbo Other  Clinician: Referring Lanyah Spengler: Bernerd Limbo Treating Brentley Horrell/Extender: Skipper Cliche in Treatment: 3 Active Inactive Wound/Skin Impairment Nursing Diagnoses: Impaired tissue integrity Goals: Patient/caregiver will verbalize understanding of skin care regimen Date Initiated: 06/05/2020 Date Inactivated: 06/26/2020 Target Resolution Date: 06/05/2020 Goal Status: Met Ulcer/skin breakdown will have a volume reduction of 30% by week 4 Date Initiated: 06/05/2020 Date Inactivated: 06/26/2020 Target Resolution Date: 07/05/2020 Goal Status: Met Ulcer/skin breakdown will have a volume reduction of 50% by week 8 Date Initiated: 06/05/2020 Target Resolution Date: 08/05/2020 Goal Status: Active Ulcer/skin breakdown will have a volume reduction of 80% by week 12 Date Initiated: 06/05/2020 Target Resolution Date: 09/04/2020 Goal Status: Active Ulcer/skin breakdown will heal within 14 weeks Date Initiated: 06/05/2020 Target Resolution Date: 10/05/2020 Goal Status: Active Interventions: Assess patient/caregiver ability to obtain necessary supplies Assess patient/caregiver ability to perform ulcer/skin care regimen upon admission and as needed Assess ulceration(s) every visit Provide education on ulcer and skin care Treatment Activities: Referred to DME Taylar Hartsough for dressing supplies : 06/05/2020 Skin care regimen initiated : 06/05/2020 Notes: Electronic Signature(s) Signed: 06/26/2020 5:24:26 PM By: Georges Mouse, Minus Breeding RN Entered By: Georges Mouse, Minus Breeding on 06/26/2020 14:10:43 Jagoda, Guy Sandifer ([id number]) -------------------------------------------------------------------------------- Pain Assessment Details Patient Name: DOWELL, HOON. Date of Service: 06/26/2020 1:30 PM Medical Record Number: [id number] Patient Account Number: [id number] Date of Birth/Sex: October 02, 1950 (70 y.o. M) Treating RN: Carlene Coria Primary Care Tayona Sarnowski: Bernerd Limbo Other Clinician: Referring Jamila Slatten: Bernerd Limbo Treating Camiyah Friberg/Extender: Skipper Cliche in Treatment: 3 Active Problems Location of Pain Severity and Description of Pain Patient Has Paino No Site Locations Pain Management and Medication Current Pain Management: Electronic Signature(s) Signed: 06/30/2020 2:18:56 PM By: Carlene Coria RN Entered By: Carlene Coria on 06/26/2020 13:51:29 Piscopo, Guy Sandifer ([id number]) -------------------------------------------------------------------------------- Patient/Caregiver Education Details Patient Name: [name], [name]. Date of Service: 06/26/2020 1:30 PM Medical Record Number: [id number] Patient Account Number: [id number] Date of Birth/Gender: 06/11/1950 (70 y.o. M) Treating RN: Dolan Amen Primary Care Physician: Bernerd Limbo Other Clinician: Referring Physician: Bernerd Limbo Treating Physician/Extender: Skipper Cliche in Treatment: 3 Education Assessment Education Provided To: Patient Education Topics Provided Wound/Skin Impairment: Methods: Explain/Verbal Responses: State content correctly Electronic Signature(s) Signed: 06/26/2020 5:24:26 PM By: Georges Mouse, Minus Breeding RN Entered By: Georges Mouse, Minus Breeding on 06/26/2020 14:14:57 [name], [name] ([id number]) -------------------------------------------------------------------------------- Wound Assessment Details Patient Name: [name], [name]. Date of Service: 06/26/2020 1:30 PM Medical Record Number: [id number] Patient Account Number: [id number] Date of Birth/Sex: [birth year] (70 y.o. M) Treating RN: Carlene Coria Primary Care Mattheus Rauls: Bernerd Limbo Other Clinician: Referring Pike Scantlebury: Bernerd Limbo Treating Anvay Tennis/Extender: Skipper Cliche in Treatment: 3 Wound Status Wound Number: 1 Primary Lymphedema Etiology: Wound Location: Right, Distal, Posterior Lower Leg Wound Open Wounding  Event: Shear/Friction Status: Date Acquired: 04/08/2020 Comorbid Sleep Apnea, Arrhythmia, Congestive Heart Failure, Weeks Of  Treatment: 3 History: Deep Vein Thrombosis, Osteoarthritis Clustered Wound: No Photos Wound Measurements Length: (cm) 1 Width: (cm) 1 Depth: (cm) 0.1 Area: (cm) 0.785 Volume: (cm) 0.079 % Reduction in Area: 96.8% % Reduction in Volume: 96.8% Epithelialization: Medium (34-66%) Tunneling: No Undermining: No Wound Description Classification: Full Thickness Without Exposed Support Structu Exudate Amount: Medium Exudate Type: Serosanguineous Exudate Color: red, brown res Foul Odor After Cleansing: No Slough/Fibrino No Wound Bed Granulation Amount: Large (67-100%) Exposed Structure Granulation Quality: Red, Pink Fascia Exposed: No Necrotic Amount: None Present (0%) Fat Layer (Subcutaneous Tissue) Exposed: Yes Tendon Exposed: No Muscle Exposed: No Joint Exposed: No Bone Exposed: No Treatment Notes Wound #1 (Lower Leg) Wound Laterality: Right, Posterior, Distal Cleanser Soap and Water Discharge Instruction: Gently cleanse wound with antibacterial soap, rinse and pat dry prior to dressing wounds Peri-Wound Care Triamcinolone Acetonide Cream, 0.1%, 15 (g) tube [name], [name] ([id number]) Discharge Instruction: On legs Topical Primary Dressing Silvercel 4 1/4x 4 1/4 (in/in) Discharge Instruction: Apply Silvercel 4 1/4x 4 1/4 (in/in) as instructed Secondary Dressing ABD Pad 5x9 (in/in) Discharge Instruction: Cover with ABD pad Secured With Compression Wrap Profore Lite LF 3 Multilayer Compression [place] Discharge Instruction: Apply 3 multi-layer wrap as prescribed. Compression Stockings Add-Ons Electronic Signature(s) Signed: 06/30/2020 2:18:56 PM By: Carlene Coria RN Entered By: Carlene Coria on 06/26/2020 13:55:45 [name], [name] ([id number]) -------------------------------------------------------------------------------- Wound Assessment Details Patient Name: [name], [name]. Date of Service: 06/26/2020 1:30 PM Medical Record Number: [id number] Patient  Account Number: [id number] Date of Birth/Sex: 25-Sep-1950 (70 y.o. M) Treating RN: Carlene Coria Primary Care Terri Malerba: Bernerd Limbo Other Clinician: Referring Kasai Beltran: Bernerd Limbo Treating Jaquaya Coyle/Extender: Skipper Cliche in Treatment: 3 Wound Status Wound Number: 2 Primary Lymphedema Etiology: Wound Location: Right, Midline, Anterior Lower Leg Wound Healed - Epithelialized Wounding Event: Shear/Friction Status: Date Acquired: 04/08/2020 Comorbid Sleep Apnea, Arrhythmia, Congestive Heart Failure, Weeks Of Treatment: 3 History: Deep Vein Thrombosis, Osteoarthritis Clustered Wound: Yes Photos Wound Measurements Length: (cm) 0 Width: (cm) 0 Depth: (cm) 0 Area: (cm) 0 Volume: (cm) 0 % Reduction in Area: 100% % Reduction in Volume: 100% Epithelialization: Large (67-100%) Tunneling: No Undermining: No Wound Description Classification: Full Thickness Without Exposed Support Structure Exudate Amount: None Present s Foul Odor After Cleansing: No Slough/Fibrino No Wound Bed Granulation Amount: None Present (0%) Exposed Structure Necrotic Amount: None Present (0%) Fascia Exposed: No Fat Layer (Subcutaneous Tissue) Exposed: No Tendon Exposed: No Muscle Exposed: No Joint Exposed: No Bone Exposed: No Electronic Signature(s) Signed: 06/26/2020 5:24:26 PM By: Charlett Nose RN Signed: 06/30/2020 2:18:56 PM By: Carlene Coria RN Entered By: Georges Mouse, Minus Breeding on 06/26/2020 14:10:13 Barretta, Guy Sandifer ([id number]) -------------------------------------------------------------------------------- Vitals Details Patient Name: ELO, MARMOLEJOS. Date of Service: 06/26/2020 1:30 PM Medical Record Number: [id number] Patient Account Number: [id number] Date of Birth/Sex: [birth year] (70 y.o. M) Treating RN: Carlene Coria Primary Care Rolland Steinert: Bernerd Limbo Other Clinician: Referring Joylynn Defrancesco: Bernerd Limbo Treating Burdette Gergely/Extender: Skipper Cliche in Treatment: 3 Vital  Signs Time Taken: 13:51 Temperature (F): 98 Height (in): 68 Pulse (bpm): 71 Weight (lbs): 365 Respiratory Rate (breaths/min): 20 Body Mass Index (BMI): 55.5 Blood Pressure (mmHg): 102/70 Reference Range: 80 - 120 mg / dl Electronic Signature(s) Signed: 06/30/2020 2:18:56 PM By: Carlene Coria RN Entered By: Carlene Coria on 06/26/2020 13:51:24

## 2020-07-03 ENCOUNTER — Encounter: Payer: Medicare Other | Admitting: Internal Medicine

## 2020-07-03 ENCOUNTER — Other Ambulatory Visit: Payer: Self-pay

## 2020-07-03 DIAGNOSIS — I87331 Chronic venous hypertension (idiopathic) with ulcer and inflammation of right lower extremity: Secondary | ICD-10-CM | POA: Diagnosis not present

## 2020-07-04 NOTE — Progress Notes (Signed)
[name], [name] ([id number]) Visit Report for 07/03/2020 HPI Details Patient Name: [name], [name]. Date of Service: 07/03/2020 12:30 PM Medical Record Number: [id number] Patient Account Number: [id number] Date of Birth/Sex: 1950/05/17 (70 y.o. 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 He is wheelchair dependent but can transfer himself. He was seen in the ER on 04/24/2020 with right leg cellulitis given doxycycline. He was back in the ER on 05/27/2020 with stasis dermatitis however they went ahead and gave him a course of Keflex. He says his wounds started sometime in March where he had wheelchair trauma with another resident. This may have happened twice. He has [facility] where not exactly sure what they are putting on the wound but it is probably collagen. He denies any prior history of pelvic trauma, radiation, malignancy etc. I am not able to really get a history of how long the amount of edema in his right leg is been present. Past medical history includes left above-knee amputation, congestive heart failure, hypothyroidism, history of MRSA, A. fib on Xarelto. Patient claims he is not a diabetic but there is some parts of his assisted living records that suggest he has diabetes although his last hemoglobin A1c that I can see was not indicative. ABIs were noncompressible 06/12/2020 upon evaluation today patient appears to be doing well about the same in regard to his wounds. He probably has a little bit of improvement based on what I am seeing  done little bit more epithelialized around some of the edges but he still has a lot of drainage. Fortunately home health is coming out which is great news. There is no sign of active infection at this time which is great news. 06/26/2020 upon evaluation today patient appears to be doing well with regard to his leg ulcer. In fact the anterior is healed the posterior is smaller. I think we are headed in the correct direction. 5/26; right lower leg. This actually looks quite good using Hydrofera Blue under 3 layer compression. He has significant lymphedema Electronic Signature(s) Signed: 07/04/2020 4:35:25 PM By: Linton Ham MD Entered By: Linton Ham on 07/03/2020 13:15:13 [name], [name] ([id number]) -------------------------------------------------------------------------------- Physical Exam Details Patient Name: [name], [name]. Date of Service: 07/03/2020 12:30 PM Medical Record Number: [id number] Patient Account Number: [id number] Date of Birth/Sex: 01-31-1951 (70 y.o. M) Treating RN: Dolan Amen Primary Care Provider: Bernerd Limbo Other Clinician: Referring Provider: Bernerd Limbo Treating Provider/Extender: Tito Dine in Treatment: 4 Constitutional Sitting or standing Blood Pressure is within target range for patient.. Pulse regular and within target range for patient.Marland Kitchen Respirations regular, non- labored and within target range.. Temperature is normal and within the target range for the patient.Marland Kitchen appears in no distress. Notes Wound exam areas on the posterior medial lower extremity. There is measuring smaller very clean. No debridement. The edema control in his leg is marginal at best although it appears to be contracting the wound therefore no change in the compression. There is no evidence of cellulitis Electronic Signature(s) Signed: 07/04/2020 4:35:25 PM By: Linton Ham MD Entered By: Linton Ham on 07/03/2020 13:17:45 [name], [name]  [name]  ([id number]) -------------------------------------------------------------------------------- Physician Orders Details Patient Name: [name], [name]. Date of Service: 07/03/2020 12:30 PM Medical Record Number: [id number] Patient Account Number: [id number] Date of Birth/Sex: Jul 04, 1950 (70 y.o. M) Treating RN: Dolan Amen Primary Care Provider: Bernerd Limbo Other Clinician: Referring Provider: Bernerd Limbo Treating Provider/Extender: Tito Dine in Treatment: 4 Verbal / Phone Orders: No Diagnosis Coding Follow-up Appointments o Return Appointment in 2 weeks. [place]: - [facility] for wound care. May utilize formulary equivalent dressing for wound treatment orders unless otherwise specified. Home Health Nurse may visit PRN to address patientos wound care needs. o Scheduled days for dressing changes to be completed; exception, patient has scheduled wound care visit that day. - Monday or Tuesday-pt seen in office Thursdays Edema Control - Lymphedema / Segmental Compressive Device / Other o Optional: One layer of unna paste to top of compression wrap (to act as an anchor). - Apply unna paste as anchor 2 cm below knee bend o Elevate legs to the level of the heart and pump ankles as often as possible o Elevate leg(s) parallel to the floor when sitting. Wound Treatment Wound #1 - Lower Leg Wound Laterality: Right, Posterior, Distal Cleanser: Soap and Water 2 x Per Week/15 Days Discharge Instructions: Gently cleanse wound with antibacterial soap, rinse and pat dry prior to dressing wounds Peri-Wound Care: Triamcinolone Acetonide Cream, 0.1%, 15 (g) tube 2 x Per Week/15 Days Discharge Instructions: On legs Primary Dressing: Silvercel 4 1/4x 4 1/4 (in/in) 2 x Per Week/15 Days Discharge Instructions: Apply Silvercel 4 1/4x 4 1/4 (in/in) as instructed Secondary Dressing: ABD Pad 5x9 (in/in) 2 x Per Week/15  Days Discharge Instructions: Cover with ABD pad Compression Wrap: Profore Lite LF 3 Multilayer Compression Bandaging System 2 x Per Week/15 Days Discharge Instructions: Apply 3 multi-layer wrap as prescribed. Electronic Signature(s) Signed: 07/03/2020 4:56:26 PM By: Georges Mouse, Minus Breeding RN Signed: 07/04/2020 4:35:25 PM By: Linton Ham MD Entered By: Georges Mouse, Minus Breeding on 07/03/2020 13:02:19 [name], [name] ([id number]) -------------------------------------------------------------------------------- Problem List Details Patient Name: KAVONTE, BEARSE. Date of Service: 07/03/2020 12:30 PM Medical Record Number: [id number] Patient Account Number: [id number] Date of Birth/Sex: October 27, 1950 (70 y.o. M) Treating RN: Dolan Amen Primary Care Provider: Bernerd Limbo Other Clinician: Referring Provider: Bernerd Limbo Treating Provider/Extender: Tito Dine in Treatment: 4 Active Problems ICD-10 Encounter Code Description Active Date MDM Diagnosis I89.0 Lymphedema, not elsewhere classified 06/05/2020 No Yes I87.331 Chronic venous hypertension (idiopathic) with ulcer and inflammation of 06/05/2020 No Yes right lower extremity L97.818 Non-pressure chronic ulcer of other part of right lower leg with other 06/05/2020 No Yes specified severity Inactive Problems Resolved Problems Electronic Signature(s) Signed: 07/04/2020 4:35:25 PM By: Linton Ham MD Entered By: Linton Ham on 07/03/2020 13:13:27 Gimbel, Guy Sandifer ([id number]) -------------------------------------------------------------------------------- Progress Note Details Patient Name: [name], [name]. Date of Service: 07/03/2020 12:30 PM Medical Record Number: [id number] Patient Account Number: [id number] Date of Birth/Sex: [birth year] (70 y.o. 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 He is wheelchair dependent but can transfer himself. He was  seen in the ER on 04/24/2020 with right leg cellulitis given doxycycline. He was back in the ER on 05/27/2020 with stasis dermatitis however they went ahead and gave him a course of Keflex. He says his wounds started sometime in March where he had wheelchair trauma with another resident. This may have happened twice. He has [facility] home [facility] where not exactly sure what they are putting on the wound but it is probably collagen. He denies any prior history of pelvic trauma, radiation, malignancy etc. I am not able to really get a history of how long the amount of edema in his right leg is been present. Past medical history includes left above-knee amputation, congestive heart failure, hypothyroidism, history of MRSA, A. fib on Xarelto. Patient claims he is not a diabetic but there is some parts of his assisted living records that suggest he has diabetes although his last hemoglobin A1c that I can see was not indicative. ABIs were noncompressible 06/12/2020 upon evaluation today patient appears to be doing well about the same in regard to his wounds. He probably has a little bit of improvement based on what I am seeing done little bit more epithelialized around some of the edges but he still has a lot of drainage. Fortunately home health is coming out which is great news. There is no sign of active infection at this time which is great news. 06/26/2020 upon evaluation today patient appears to be doing well with regard to his leg ulcer. In fact the anterior is healed the posterior is smaller. I think we are headed in the correct  direction. 5/26; right lower leg. This actually looks quite good using Hydrofera Blue under 3 layer compression. He has significant lymphedema Objective Constitutional Sitting or standing Blood Pressure is within target range for patient.. Pulse regular and within target range for patient.Marland Kitchen Respirations regular, non- labored and within target range.. Temperature is normal and within the target range for the patient.Marland Kitchen appears in no distress. Vitals Time Taken: 12:39 PM, Height: 68 in, Weight: 365 lbs, BMI: 55.5, Temperature: 98.3 F, Pulse: 90 bpm, Respiratory Rate: 20 breaths/min, Blood Pressure: 131/78 mmHg. General Notes: Wound exam areas on the posterior medial lower extremity. There is measuring smaller very clean. No debridement. The edema control in his leg is marginal at best although it appears to be contracting the wound therefore no change in the compression. There is no evidence of cellulitis Integumentary (Hair, Skin) Wound #1 status is Open. Original cause of wound was Shear/Friction. The date acquired was: 04/08/2020. The wound has been in treatment 4 weeks. The wound is located on the Right,Distal,Posterior Lower Leg. The wound measures 0.7cm length x 0.8cm width x 0.2cm depth; 0.44cm^2 area and 0.088cm^3 volume. There is Fat Layer (Subcutaneous Tissue) exposed. There is no tunneling or undermining noted. There is a medium amount of serosanguineous drainage noted. There is large (67-100%) red, pink granulation within the wound bed. There is no necrotic tissue within the wound bed. Assessment Active Problems [name], [name] ([id number]) ICD-10 Lymphedema, not elsewhere classified Chronic venous hypertension (idiopathic) with ulcer and inflammation of right lower extremity Non-pressure chronic ulcer of other part of right lower leg with other specified severity Procedures Wound #1 Pre-procedure diagnosis of Wound #1 is a Lymphedema located on the Right,Distal,Posterior Lower Leg  . There was a Three Layer Compression Therapy Procedure by Dolan Amen, RN. Post procedure Diagnosis Wound #1: Same as Pre-Procedure Notes: pt tolerating wrap well. Plan Follow-up Appointments: Return Appointment in 2 weeks.  Home Health: [place]: - [facility] for wound care. May utilize formulary equivalent dressing for wound treatment orders unless otherwise specified. Home Health Nurse may visit PRN to address patient s wound care needs. Scheduled days for dressing changes to be completed; exception, patient has scheduled wound care visit that day. - Monday or Tuesday-pt seen in office Thursdays Edema Control - Lymphedema / Segmental Compressive Device / Other: Optional: One layer of unna paste to top of compression wrap (to act as an anchor). - Apply unna paste as anchor 2 cm below knee bend Elevate legs to the level of the heart and pump ankles as often as possible Elevate leg(s) parallel to the floor when sitting. WOUND #1: - Lower Leg Wound Laterality: Right, Posterior, Distal Cleanser: Soap and Water 2 x Per Week/15 Days Discharge Instructions: Gently cleanse wound with antibacterial soap, rinse and pat dry prior to dressing wounds Peri-Wound Care: Triamcinolone Acetonide Cream, 0.1%, 15 (g) tube 2 x Per Week/15 Days Discharge Instructions: On legs Primary Dressing: Silvercel 4 1/4x 4 1/4 (in/in) 2 x Per Week/15 Days Discharge Instructions: Apply Silvercel 4 1/4x 4 1/4 (in/in) as instructed Secondary Dressing: ABD Pad 5x9 (in/in) 2 x Per Week/15 Days Discharge Instructions: Cover with ABD pad Compression Wrap: Profore Lite LF 3 Multilayer Compression Bandaging System 2 x Per Week/15 Days Discharge Instructions: Apply 3 multi-layer wrap as prescribed. 1. Primary dressing is silver alginate ABD under 3 layer compression 2. I had some thoughts about increasing him to 4-layer compression his pulses are palpable and I think he would be able  to tolerate it although his ABIs are noncompressible Electronic Signature(s) Signed: 07/04/2020 4:35:25 PM By: Linton Ham MD Entered By: Linton Ham on 07/03/2020 13:18:42 Wildrick, Guy Sandifer ([id number]) -------------------------------------------------------------------------------- SuperBill Details Patient Name: [name], [name]. Date of Service: 07/03/2020 Medical Record Number: [id number] Patient Account Number: [id number] Date of Birth/Sex: [birth year] (70 y.o. M) Treating RN: Dolan Amen Primary Care Provider: Bernerd Limbo Other Clinician: Referring Provider: Bernerd Limbo Treating Provider/Extender: Tito Dine in Treatment: 4 Diagnosis Coding ICD-10 Codes Code Description I89.0 Lymphedema, not elsewhere classified I87.331 Chronic venous hypertension (idiopathic) with ulcer and inflammation of right lower extremity L97.818 Non-pressure chronic ulcer of other part of right lower leg with other specified severity Facility Procedures CPT4 Code: 67544920 Description: (Facility Use Only) [phone] - [facility] L[facility] RT LEG Modifier: Quantity: 1 Physician Procedures CPT4 Code Description: [id number] [facility] - WC PHYS LEVEL 3 - EST PT Modifier: Quantity: 1 CPT4 Code Description: ICD-10 Diagnosis Description I89.0 Lymphedema, not elsewhere classified I87.331 Chronic venous hypertension (idiopathic) with ulcer and inflammation of righ L97.818 Non-pressure chronic ulcer of other part of right lower leg with  other speci Modifier: t lower extremit fied severity Quantity: y Engineer, maintenance) Signed: 07/04/2020 4:35:25 PM By: Linton Ham MD Entered By: Linton Ham on 07/03/2020 13:19:02

## 2020-07-08 NOTE — Progress Notes (Addendum)
REID, REGAS ([id number]) Visit Report for 07/03/2020 Arrival Information Details Patient Name: [name], [name]. Date of Service: 07/03/2020 12:30 PM Medical Record Number: [id number] Patient Account Number: [id number] Date of Birth/Sex: 19-Oct-1950 (70 y.o. M) Treating RN: Carlene Coria Primary Care Gilberte Gorley: Bernerd Limbo Other Clinician: Referring Laurance Heide: Bernerd Limbo Treating Emerald Gehres/Extender: Tito Dine in Treatment: 4 Visit Information History Since Last Visit All ordered tests and consults were completed: No Patient Arrived: Wheel Chair Added or deleted any medications: No Arrival Time: 12:35 Any new allergies or adverse reactions: No Accompanied By: self Had a fall or experienced change in No Transfer Assistance: None activities of daily living that may affect Patient Identification Verified: Yes risk of falls: Secondary Verification Process Completed: Yes Signs or symptoms of abuse/neglect since last visito No Patient Requires Transmission-Based No Hospitalized since last visit: No Precautions: Implantable device outside of the clinic excluding No Patient Has Alerts: Yes cellular tissue based products placed in the center Patient Alerts: Patient on Blood Thinner since last visit: NOT diabetic Has Dressing in Place as Prescribed: Yes XARELTO Has Compression in Place as Prescribed: Yes Lives at [facility] Pain Present Now: No ABI non compressible Rleg Left AKA Electronic Signature(s) Signed: 07/08/2020 3:50:09 PM By: Carlene Coria RN Entered By: Carlene Coria on 07/03/2020 12:39:36 [name], [name] ([id number]) -------------------------------------------------------------------------------- Clinic Level of Care Assessment Details Patient Name: [name], [name]. Date of Service: 07/03/2020 12:30 PM Medical Record Number: [id number] Patient Account Number: [id number] Date of Birth/Sex: 09/28/1950 (70 y.o. M) Treating RN: Dolan Amen Primary Care  Tangelia Sanson: Bernerd Limbo Other Clinician: Referring Raylynn Hersh: Bernerd Limbo Treating Sissy Goetzke/Extender: Tito Dine in Treatment: 4 Clinic Level of Care Assessment Items TOOL 1 Quantity Score []  - Use when EandM and Procedure is performed on INITIAL visit 0 ASSESSMENTS - Nursing Assessment / Reassessment []  - General Physical Exam (combine w/ comprehensive assessment (listed just below) when performed on new 0 pt. evals) []  - 0 Comprehensive Assessment (HX, ROS, Risk Assessments, Wounds Hx, etc.) ASSESSMENTS - Wound and Skin Assessment / Reassessment []  - Dermatologic / Skin Assessment (not related to wound area) 0 ASSESSMENTS - Ostomy and/or Continence Assessment and Care []  - Incontinence Assessment and Management 0 []  - 0 Ostomy Care Assessment and Management (repouching, etc.) PROCESS - Coordination of Care []  - Simple Patient / Family Education for ongoing care 0 []  - 0 Complex (extensive) Patient / Family Education for ongoing care []  - 0 Staff obtains Programmer, systems, Records, Test Results / Process Orders []  - 0 Staff telephones HHA, Nursing Homes / Clarify orders / etc []  - 0 Routine Transfer to another Facility (non-emergent condition) []  - 0 Routine Hospital Admission (non-emergent condition) []  - 0 New Admissions / Biomedical engineer / Ordering NPWT, Apligraf, etc. []  - 0 Emergency Hospital Admission (emergent condition) PROCESS - Special Needs []  - Pediatric / Minor Patient Management 0 []  - 0 Isolation Patient Management []  - 0 Hearing / Language / Visual special needs []  - 0 Assessment of Community assistance (transportation, D/C planning, etc.) []  - 0 Additional assistance / Altered mentation []  - 0 Support Surface(s) Assessment (bed, cushion, seat, etc.) INTERVENTIONS - Miscellaneous []  - External ear exam 0 []  - 0 Patient Transfer (multiple staff / Civil Service fast streamer / Similar devices) []  - 0 Simple Staple / Suture removal (25 or less) []  -  0 Complex Staple / Suture removal (26 or more) []  - 0 Hypo/Hyperglycemic Management (do not check if billed separately) []  - 0 Ankle /  Brachial Index (ABI) - do not check if billed separately Has the patient been seen at the hospital within the last three years: Yes Total Score: 0 Level Of Care: ____ [name] ([id number]) Electronic Signature(s) Signed: 07/03/2020 4:56:26 PM By: Georges Mouse, Minus Breeding RN Entered By: Georges Mouse, Minus Breeding on 07/03/2020 13:02:25 MANRAJ, YEO ([id number]) -------------------------------------------------------------------------------- Compression Therapy Details Patient Name: [name], [name]. Date of Service: 07/03/2020 12:30 PM Medical Record Number: [id number] Patient Account Number: [id number] Date of Birth/Sex: [birth year] (70 y.o. M) Treating RN: Dolan Amen Primary Care Esta Carmon: Bernerd Limbo Other Clinician: Referring Meghna Hagmann: Bernerd Limbo Treating Emilly Lavey/Extender: Tito Dine in Treatment: 4 Compression Therapy Performed for Wound Assessment: Wound #1 Right,Distal,Posterior Lower Leg Performed By: Clinician Dolan Amen, RN Compression Type: Three Layer Post Procedure Diagnosis Same as Pre-procedure Notes pt tolerating wrap well Electronic Signature(s) Signed: 07/03/2020 4:56:26 PM By: Georges Mouse, Minus Breeding RN Entered By: Georges Mouse, Kenia on 07/03/2020 13:01:36 [name], [name] ([id number]) -------------------------------------------------------------------------------- Encounter Discharge Information Details Patient Name: [name], [name]. Date of Service: 07/03/2020 12:30 PM Medical Record Number: [id number] Patient Account Number: [id number] Date of Birth/Sex: [birth year] (70 y.o. M) Treating RN: Dolan Amen Primary Care Calum Cormier: Bernerd Limbo Other Clinician: Referring Zacaria Pousson: Bernerd Limbo Treating Heberto Sturdevant/Extender: Tito Dine in Treatment: 4 Encounter Discharge Information  Items Discharge Condition: Stable Ambulatory Status: Wheelchair Discharge Destination: Home Transportation: Private Auto Accompanied By: self Schedule Follow-up Appointment: Yes Clinical Summary of Care: Electronic Signature(s) Signed: 07/03/2020 4:37:07 PM By: Jeanine Luz Entered By: Jeanine Luz on 07/03/2020 13:26:50 Lindler, [name] ([id number]) -------------------------------------------------------------------------------- Lower Extremity Assessment Details Patient Name: [name], [name]. Date of Service: 07/03/2020 12:30 PM Medical Record Number: [id number] Patient Account Number: [id number] Date of Birth/Sex: 02-17-1950 (70 y.o. M) Treating RN: Carlene Coria Primary Care Morley Gaumer: Bernerd Limbo Other Clinician: Referring Ardean Melroy: Bernerd Limbo Treating Trew Sunde/Extender: Tito Dine in Treatment: 4 Edema Assessment Assessed: Shirlyn Goltz: No] [Right: Yes] Edema: [Left: Ye] [Right: s] Calf Left: Right: Point of Measurement: 34.5 cm From Medial Instep 55.2 cm Ankle Left: Right: Point of Measurement: 18 cm From Medial Instep 28.4 cm Vascular Assessment Pulses: Dorsalis Pedis Palpable: [Right:Yes] Electronic Signature(s) Signed: 07/08/2020 3:50:09 PM By: Carlene Coria RN Entered By: Carlene Coria on 07/03/2020 12:45:57 Leckrone, [name] ([id number]) -------------------------------------------------------------------------------- Multi Wound Chart Details Patient Name: [name], [name]. Date of Service: 07/03/2020 12:30 PM Medical Record Number: [id number] Patient Account Number: [id number] Date of Birth/Sex: November 15, 1950 (70 y.o. M) Treating RN: Dolan Amen Primary Care Clayborn Milnes: Bernerd Limbo Other Clinician: Referring Tamari Redwine: Bernerd Limbo Treating Madelyne Millikan/Extender: Tito Dine in Treatment: 4 Vital Signs Height(in): 51 Pulse(bpm): 90 Weight(lbs): 365 Blood Pressure(mmHg): 131/78 Body Mass Index(BMI): 55 Temperature(F): 98.3 Respiratory  Rate(breaths/min): 20 Photos: [N/A:N/A] Wound Location: Right, Distal, Posterior Lower Leg N/A N/A Wounding Event: Shear/Friction N/A N/A Primary Etiology: Lymphedema N/A N/A Comorbid History: Sleep Apnea, Arrhythmia, N/A N/A Congestive Heart Failure, Deep Vein Thrombosis, Osteoarthritis Date Acquired: 04/08/2020 N/A N/A Weeks of Treatment: 4 N/A N/A Wound Status: Open N/A N/A Measurements L x W x D (cm) 0.7x0.8x0.2 N/A N/A Area (cm) : 0.44 N/A N/A Volume (cm) : 0.088 N/A N/A % Reduction in Area: 98.20% N/A N/A % Reduction in Volume: 96.40% N/A N/A Classification: Full Thickness Without Exposed N/A N/A Support Structures Exudate Amount: Medium N/A N/A Exudate Type: Serosanguineous N/A N/A Exudate Color: red, brown N/A N/A Granulation Amount: Large (67-100%) N/A N/A Granulation Quality: Red, Pink N/A N/A Necrotic Amount: None Present (0%) N/A N/A Exposed Structures:  Fat Layer (Subcutaneous Tissue): N/A N/A Yes Fascia: No Tendon: No Muscle: No Joint: No Bone: No Epithelialization: Medium (34-66%) N/A N/A Procedures Performed: Compression Therapy N/A N/A Treatment Notes Electronic Signature(s) Signed: 07/04/2020 4:35:25 PM By: Linton Ham MD Entered By: Linton Ham on 07/03/2020 13:13:34 Banos, [name] ([id number]) -------------------------------------------------------------------------------- Multi-Disciplinary Care Plan Details Patient Name: [name], [name]. Date of Service: 07/03/2020 12:30 PM Medical Record Number: [id number] Patient Account Number: [id number] Date of Birth/Sex: 1951-01-25 (70 y.o. M) Treating RN: Dolan Amen Primary Care Ramesses Crampton: Bernerd Limbo Other Clinician: Referring Ainslee Sou: Bernerd Limbo Treating Atha Mcbain/Extender: Tito Dine in Treatment: 4 Active Inactive Electronic Signature(s) Signed: 08/21/2020 6:01:58 PM By: Gretta Cool BSN, RN, CWS, Kim RN, BSN Signed: 10/15/2020 9:46:07 AM By: Dolan Amen RN Previous Signature:  07/03/2020 4:56:26 PM Version By: Georges Mouse, Minus Breeding RN Entered By: Gretta Cool, BSN, RN, CWS, Kim on 08/21/2020 18:01:57 [name], [name] ([id number]) -------------------------------------------------------------------------------- Pain Assessment Details Patient Name: [name], [name]. Date of Service: 07/03/2020 12:30 PM Medical Record Number: [id number] Patient Account Number: [id number] Date of Birth/Sex: 04-Nov-1950 (70 y.o. M) Treating RN: Carlene Coria Primary Care Nachman Sundt: Bernerd Limbo Other Clinician: Referring Chestine Belknap: Bernerd Limbo Treating Chiara Coltrin/Extender: Tito Dine in Treatment: 4 Active Problems Location of Pain Severity and Description of Pain Patient Has Paino No Site Locations Pain Management and Medication Current Pain Management: Electronic Signature(s) Signed: 07/08/2020 3:50:09 PM By: Carlene Coria RN Entered By: Carlene Coria on 07/03/2020 12:39:57 Kasal, [name] ([id number]) -------------------------------------------------------------------------------- Patient/Caregiver Education Details Patient Name: [name], [name]. Date of Service: 07/03/2020 12:30 PM Medical Record Number: [id number] Patient Account Number: [id number] Date of Birth/Gender: May 02, 1950 (70 y.o. M) Treating RN: Dolan Amen Primary Care Physician: Bernerd Limbo Other Clinician: Referring Physician: Bernerd Limbo Treating Physician/Extender: Tito Dine in Treatment: 4 Education Assessment Education Provided To: Patient Education Topics Provided Wound/Skin Impairment: Methods: Explain/Verbal Responses: State content correctly Electronic Signature(s) Signed: 07/03/2020 4:56:26 PM By: Georges Mouse, Minus Breeding RN Entered By: Georges Mouse, Minus Breeding on 07/03/2020 13:02:40 [name], [name] ([id number]) -------------------------------------------------------------------------------- Wound Assessment Details Patient Name: [name], [name]. Date of Service: 07/03/2020 12:30  PM Medical Record Number: [id number] Patient Account Number: [id number] Date of Birth/Sex: [birth year] (70 y.o. M) Treating RN: Carlene Coria Primary Care Nick Armel: Bernerd Limbo Other Clinician: Referring Annebelle Bostic: Bernerd Limbo Treating Leani Myron/Extender: Tito Dine in Treatment: 4 Wound Status Wound Number: 1 Primary Lymphedema Etiology: Wound Location: Right, Distal, Posterior Lower Leg Wound Open Wounding Event: Shear/Friction Status: Date Acquired: 04/08/2020 Comorbid Sleep Apnea, Arrhythmia, Congestive Heart Failure, Weeks Of Treatment: 4 History: Deep Vein Thrombosis, Osteoarthritis Clustered Wound: No Photos Wound Measurements Length: (cm) 0.7 Width: (cm) 0.8 Depth: (cm) 0.2 Area: (cm) 0.44 Volume: (cm) 0.088 % Reduction in Area: 98.2% % Reduction in Volume: 96.4% Epithelialization: Medium (34-66%) Tunneling: No Undermining: No Wound Description Classification: Full Thickness Without Exposed Support Structu Exudate Amount: Medium Exudate Type: Serosanguineous Exudate Color: red, brown res Foul Odor After Cleansing: No Slough/Fibrino No Wound Bed Granulation Amount: Large (67-100%) Exposed Structure Granulation Quality: Red, Pink Fascia Exposed: No Necrotic Amount: None Present (0%) Fat Layer (Subcutaneous Tissue) Exposed: Yes Tendon Exposed: No Muscle Exposed: No Joint Exposed: No Bone Exposed: No Electronic Signature(s) Signed: 07/08/2020 3:50:09 PM By: Carlene Coria RN Entered By: Carlene Coria on 07/03/2020 12:45:31 Amster, [name] ([id number]) -------------------------------------------------------------------------------- Vitals Details Patient Name: MANAV, PIEROTTI. Date of Service: 07/03/2020 12:30 PM Medical Record Number: [id number] Patient Account Number: [id number] Date of Birth/Sex: 09-19-1950 (70 y.o. M) Treating RN: Epps,  Morey Hummingbird Primary Care Zane Pellecchia: Bernerd Limbo Other Clinician: Referring Yarelly Kuba: Bernerd Limbo Treating  Rea Reser/Extender: Tito Dine in Treatment: 4 Vital Signs Time Taken: 12:39 Temperature (F): 98.3 Height (in): 68 Pulse (bpm): 90 Weight (lbs): 365 Respiratory Rate (breaths/min): 20 Body Mass Index (BMI): 55.5 Blood Pressure (mmHg): 131/78 Reference Range: 80 - 120 mg / dl Electronic Signature(s) Signed: 07/08/2020 3:50:09 PM By: Carlene Coria RN Entered By: Carlene Coria on 07/03/2020 12:39:51

## 2020-07-10 ENCOUNTER — Ambulatory Visit: Payer: Medicare Other | Admitting: Physician Assistant

## 2020-07-17 ENCOUNTER — Ambulatory Visit: Payer: Medicare Other | Admitting: Physician Assistant

## 2020-07-31 ENCOUNTER — Ambulatory Visit: Payer: Medicare Other | Admitting: Physician Assistant

## 2020-08-14 ENCOUNTER — Emergency Department (HOSPITAL_COMMUNITY): Payer: Medicare HMO

## 2020-08-14 ENCOUNTER — Inpatient Hospital Stay (HOSPITAL_COMMUNITY)
Admission: EM | Admit: 2020-08-14 | Discharge: 2020-08-23 | DRG: 565 | Disposition: A | Discharge status: Skilled Nursing Facility | Payer: Medicare HMO | Source: Skilled Nursing Facility | Attending: Internal Medicine | Admitting: Internal Medicine

## 2020-08-14 ENCOUNTER — Observation Stay (HOSPITAL_COMMUNITY): Payer: Medicare HMO

## 2020-08-14 ENCOUNTER — Encounter (HOSPITAL_COMMUNITY): Payer: Self-pay

## 2020-08-14 DIAGNOSIS — Z66 Do not resuscitate: Secondary | ICD-10-CM | POA: Diagnosis present

## 2020-08-14 DIAGNOSIS — W19XXXA Unspecified fall, initial encounter: Secondary | ICD-10-CM | POA: Diagnosis present

## 2020-08-14 DIAGNOSIS — K5900 Constipation, unspecified: Secondary | ICD-10-CM | POA: Diagnosis present

## 2020-08-14 DIAGNOSIS — D509 Iron deficiency anemia, unspecified: Secondary | ICD-10-CM | POA: Diagnosis present

## 2020-08-14 DIAGNOSIS — R062 Wheezing: Secondary | ICD-10-CM | POA: Diagnosis not present

## 2020-08-14 DIAGNOSIS — Z89612 Acquired absence of left leg above knee: Secondary | ICD-10-CM

## 2020-08-14 DIAGNOSIS — Z79899 Other long term (current) drug therapy: Secondary | ICD-10-CM

## 2020-08-14 DIAGNOSIS — Z6841 Body Mass Index (BMI) 40.0 and over, adult: Secondary | ICD-10-CM

## 2020-08-14 DIAGNOSIS — I5032 Chronic diastolic (congestive) heart failure: Secondary | ICD-10-CM | POA: Diagnosis present

## 2020-08-14 DIAGNOSIS — Z79890 Hormone replacement therapy (postmenopausal): Secondary | ICD-10-CM

## 2020-08-14 DIAGNOSIS — R059 Cough, unspecified: Secondary | ICD-10-CM

## 2020-08-14 DIAGNOSIS — Z96651 Presence of right artificial knee joint: Secondary | ICD-10-CM | POA: Diagnosis present

## 2020-08-14 DIAGNOSIS — Z8049 Family history of malignant neoplasm of other genital organs: Secondary | ICD-10-CM

## 2020-08-14 DIAGNOSIS — K219 Gastro-esophageal reflux disease without esophagitis: Secondary | ICD-10-CM | POA: Diagnosis present

## 2020-08-14 DIAGNOSIS — R296 Repeated falls: Secondary | ICD-10-CM | POA: Diagnosis present

## 2020-08-14 DIAGNOSIS — Z885 Allergy status to narcotic agent status: Secondary | ICD-10-CM

## 2020-08-14 DIAGNOSIS — Z803 Family history of malignant neoplasm of breast: Secondary | ICD-10-CM

## 2020-08-14 DIAGNOSIS — T8744 Infection of amputation stump, left lower extremity: Principal | ICD-10-CM | POA: Diagnosis present

## 2020-08-14 DIAGNOSIS — Z8249 Family history of ischemic heart disease and other diseases of the circulatory system: Secondary | ICD-10-CM

## 2020-08-14 DIAGNOSIS — I493 Ventricular premature depolarization: Secondary | ICD-10-CM | POA: Diagnosis present

## 2020-08-14 DIAGNOSIS — Z800 Family history of malignant neoplasm of digestive organs: Secondary | ICD-10-CM

## 2020-08-14 DIAGNOSIS — Z7901 Long term (current) use of anticoagulants: Secondary | ICD-10-CM

## 2020-08-14 DIAGNOSIS — Z86718 Personal history of other venous thrombosis and embolism: Secondary | ICD-10-CM

## 2020-08-14 DIAGNOSIS — Z833 Family history of diabetes mellitus: Secondary | ICD-10-CM

## 2020-08-14 DIAGNOSIS — D539 Nutritional anemia, unspecified: Secondary | ICD-10-CM | POA: Diagnosis present

## 2020-08-14 DIAGNOSIS — I4819 Other persistent atrial fibrillation: Secondary | ICD-10-CM | POA: Diagnosis present

## 2020-08-14 DIAGNOSIS — Z8614 Personal history of Methicillin resistant Staphylococcus aureus infection: Secondary | ICD-10-CM

## 2020-08-14 DIAGNOSIS — E039 Hypothyroidism, unspecified: Secondary | ICD-10-CM | POA: Diagnosis present

## 2020-08-14 DIAGNOSIS — G4733 Obstructive sleep apnea (adult) (pediatric): Secondary | ICD-10-CM | POA: Diagnosis present

## 2020-08-14 DIAGNOSIS — L03116 Cellulitis of left lower limb: Secondary | ICD-10-CM

## 2020-08-14 DIAGNOSIS — T8740 Infection of amputation stump, unspecified extremity: Secondary | ICD-10-CM

## 2020-08-14 DIAGNOSIS — Z20822 Contact with and (suspected) exposure to covid-19: Secondary | ICD-10-CM | POA: Diagnosis present

## 2020-08-14 DIAGNOSIS — L0390 Cellulitis, unspecified: Secondary | ICD-10-CM | POA: Diagnosis present

## 2020-08-14 DIAGNOSIS — R7982 Elevated C-reactive protein (CRP): Secondary | ICD-10-CM | POA: Diagnosis present

## 2020-08-14 DIAGNOSIS — R17 Unspecified jaundice: Secondary | ICD-10-CM

## 2020-08-14 DIAGNOSIS — Y835 Amputation of limb(s) as the cause of abnormal reaction of the patient, or of later complication, without mention of misadventure at the time of the procedure: Secondary | ICD-10-CM | POA: Diagnosis present

## 2020-08-14 LAB — CBC WITH DIFFERENTIAL/PLATELET
Abs Immature Granulocytes: 0.02 10*3/uL (ref 0.00–0.07)
Basophils Absolute: 0 10*3/uL (ref 0.0–0.1)
Basophils Relative: 0 %
Eosinophils Absolute: 0 10*3/uL (ref 0.0–0.5)
Eosinophils Relative: 1 %
HCT: 37.5 % — ABNORMAL LOW (ref 39.0–52.0)
Hemoglobin: 11.6 g/dL — ABNORMAL LOW (ref 13.0–17.0)
Immature Granulocytes: 0 %
Lymphocytes Relative: 11 %
Lymphs Abs: 0.6 10*3/uL — ABNORMAL LOW (ref 0.7–4.0)
MCH: 31.4 pg (ref 26.0–34.0)
MCHC: 30.9 g/dL (ref 30.0–36.0)
MCV: 101.6 fL — ABNORMAL HIGH (ref 80.0–100.0)
Monocytes Absolute: 0.8 10*3/uL (ref 0.1–1.0)
Monocytes Relative: 15 %
Neutro Abs: 4.1 10*3/uL (ref 1.7–7.7)
Neutrophils Relative %: 73 %
Platelets: 165 10*3/uL (ref 150–400)
RBC: 3.69 MIL/uL — ABNORMAL LOW (ref 4.22–5.81)
RDW: 14.9 % (ref 11.5–15.5)
WBC: 5.6 10*3/uL (ref 4.0–10.5)
nRBC: 0 % (ref 0.0–0.2)

## 2020-08-14 LAB — LACTIC ACID, PLASMA
Lactic Acid, Venous: <0.3 mmol/L — ABNORMAL LOW (ref 0.5–1.9)
Lactic Acid, Venous: 1.2 mmol/L (ref 0.5–1.9)

## 2020-08-14 LAB — COMPREHENSIVE METABOLIC PANEL
ALT: 14 U/L (ref 0–44)
AST: 27 U/L (ref 15–41)
Albumin: 3.2 g/dL — ABNORMAL LOW (ref 3.5–5.0)
Alkaline Phosphatase: 63 U/L (ref 38–126)
Anion gap: 8 (ref 5–15)
BUN: 20 mg/dL (ref 8–23)
CO2: 25 mmol/L (ref 22–32)
Calcium: 9.2 mg/dL (ref 8.9–10.3)
Chloride: 106 mmol/L (ref 98–111)
Creatinine, Ser: 1.22 mg/dL (ref 0.61–1.24)
GFR, Estimated: >60 mL/min (ref >60)
Glucose, Bld: 101 mg/dL — ABNORMAL HIGH (ref 70–99)
Potassium: 4.8 mmol/L (ref 3.5–5.1)
Sodium: 139 mmol/L (ref 135–145)
Total Bilirubin: 1.7 mg/dL — ABNORMAL HIGH (ref 0.3–1.2)
Total Protein: 6.9 g/dL (ref 6.5–8.1)

## 2020-08-14 LAB — HIV ANTIBODY (ROUTINE TESTING W REFLEX): HIV Screen 4th Generation wRfx: NONREACTIVE

## 2020-08-14 LAB — APTT: aPTT: 48 seconds — ABNORMAL HIGH (ref 24–36)

## 2020-08-14 LAB — SARS CORONAVIRUS 2 (TAT 6-24 HRS): SARS Coronavirus 2: NEGATIVE

## 2020-08-14 LAB — PROTIME-INR
INR: 3 — ABNORMAL HIGH (ref 0.8–1.2)
Prothrombin Time: 30.8 seconds — ABNORMAL HIGH (ref 11.4–15.2)

## 2020-08-14 MED ORDER — BISACODYL 5 MG PO TBEC: 5.0000 mg | DELAYED_RELEASE_TABLET | Freq: Every day | ORAL | Status: DC | PRN

## 2020-08-14 MED ORDER — DILTIAZEM HCL ER COATED BEADS 120 MG PO CP24
240.0000 mg | ORAL_CAPSULE | Freq: Every day | ORAL | Status: DC
  Administered 2020-08-15 – 2020-08-23 (×9): 240 mg via ORAL
  Filled 2020-08-14 (×9): qty 2

## 2020-08-14 MED ORDER — RIVAROXABAN 20 MG PO TABS
20.0000 mg | ORAL_TABLET | Freq: Every day | ORAL | Status: DC
  Administered 2020-08-15: 20 mg via ORAL
  Filled 2020-08-14 (×2): qty 1

## 2020-08-14 MED ORDER — FUROSEMIDE 20 MG PO TABS
20.0000 mg | ORAL_TABLET | Freq: Every day | ORAL | Status: DC
  Administered 2020-08-15 – 2020-08-23 (×9): 20 mg via ORAL
  Filled 2020-08-14 (×9): qty 1

## 2020-08-14 MED ORDER — VANCOMYCIN HCL 10 G IV SOLR
2500.0000 mg | Freq: Once | INTRAVENOUS | Status: AC
  Administered 2020-08-14: 2500 mg via INTRAVENOUS
  Filled 2020-08-14: qty 2500

## 2020-08-14 MED ORDER — METOPROLOL TARTRATE 50 MG PO TABS
50.0000 mg | ORAL_TABLET | Freq: Two times a day (BID) | ORAL | Status: DC
  Administered 2020-08-14 – 2020-08-23 (×18): 50 mg via ORAL
  Filled 2020-08-14 (×18): qty 1

## 2020-08-14 MED ORDER — ACETAMINOPHEN 650 MG RE SUPP: 650.0000 mg | Freq: Four times a day (QID) | RECTAL | Status: DC | PRN

## 2020-08-14 MED ORDER — ACETAMINOPHEN 325 MG PO TABS
650.0000 mg | ORAL_TABLET | Freq: Four times a day (QID) | ORAL | Status: DC | PRN
  Administered 2020-08-14 – 2020-08-16 (×3): 650 mg via ORAL
  Filled 2020-08-14 (×3): qty 2

## 2020-08-14 MED ORDER — FERROUS SULFATE 325 (65 FE) MG PO TABS: 325.0000 mg | ORAL_TABLET | Freq: Three times a day (TID) | ORAL | Status: DC

## 2020-08-14 MED ORDER — VANCOMYCIN HCL IN DEXTROSE 1-5 GM/200ML-% IV SOLN
1000.0000 mg | Freq: Two times a day (BID) | INTRAVENOUS | Status: DC
  Administered 2020-08-15 – 2020-08-16 (×3): 1000 mg via INTRAVENOUS
  Filled 2020-08-14 (×5): qty 200

## 2020-08-14 MED ORDER — LEVOTHYROXINE SODIUM 25 MCG PO TABS
125.0000 ug | ORAL_TABLET | Freq: Every day | ORAL | Status: DC
  Administered 2020-08-15 – 2020-08-23 (×9): 125 ug via ORAL
  Filled 2020-08-14 (×9): qty 1

## 2020-08-14 MED ORDER — ACETAMINOPHEN 325 MG PO TABS
650.0000 mg | ORAL_TABLET | Freq: Once | ORAL | Status: AC
  Administered 2020-08-14: 650 mg via ORAL
  Filled 2020-08-14: qty 2

## 2020-08-14 MED ORDER — SENNOSIDES-DOCUSATE SODIUM 8.6-50 MG PO TABS: 1.0000 | ORAL_TABLET | Freq: Every evening | ORAL | Status: DC | PRN

## 2020-08-14 MED ORDER — FUROSEMIDE 20 MG PO TABS: 20.0000 mg | ORAL_TABLET | Freq: Every day | ORAL | Status: DC | PRN

## 2020-08-14 NOTE — Progress Notes (Signed)
Pharmacy Antibiotic Note  [name] is a 70 y.o. male admitted on 08/14/2020 presenting with leg pain s/p left leg AKA stump fall. Pt reports having weakness with chills and low grade fever x 1 week.  Pharmacy has been consulted for initial and maintenance vancomycin dosing for suspected cellulitis/concern for sepsis.  Plan: TBW 167.4 kg (IBW 68.4), BMI 56.11, Ht 68 inches Scr 1.22 CrCL 55 mL/min using IBW  Loading Dose: Vancomycin 2500 mg IV bolus Maintenance Dose: Vancomycin 1000 mg IV q12h (eAUC 475.2) Goal AUC 400-550  Monitor renal function, cx results, clinical progression, and LOT  Height: 5\' 8"  (172.7 cm) Weight: (!) 167.4 kg (369 lb) IBW/kg (Calculated) : 68.4  Temp (24hrs), Avg:100.8 F (38.2 C), Min:100.8 F (38.2 C), Max:100.8 F (38.2 C)  Recent Labs  Lab 08/14/20 1254  WBC 5.6  CREATININE 1.22     Allergies  Allergen Reactions   Morphine Hives    Antimicrobials this admission: 7/7 Vancomycin >>  Microbiology results: 7/7 BCx: pending 7/7 UCx: pending   Thank you for allowing pharmacy to be a part of this patient's care.  Marlowe Alt, PharmD Candidate 08/14/2020 3:01 PM

## 2020-08-14 NOTE — ED Provider Notes (Signed)
[facility] EMERGENCY DEPARTMENT Provider Note   CSN: [id number] Arrival date & time: 08/14/20  1241     History Chief Complaint  Patient presents with   Leg Pain    [name] is a 70 y.o. male.  Patient with history of left below-knee amputation, atrial fibrillation on Xarelto --presents to the emergency department after having a fall this morning.  Patient reports having low-grade fevers, weakness and cough over the past 4 days.  Patient fell once yesterday and again today.  He lives in an assisted living and after the fall today, EMS was called for transport.  Patient is noted increasing redness to the stump area of the left leg.  He states that he thought this was because he fell on it.  EMS reports recent negative COVID test.  Patient denies shortness of breath.  No abdominal pain, vomiting, or diarrhea.  EMS reports frequent PVCs on EKG.  No treatments prior to arrival.  The onset of this condition was acute. The course is constant. Aggravating factors: none. Alleviating factors: none.        Past Medical History:  Diagnosis Date   CHF (congestive heart failure) ([place]) 05/2019   Chronic anticoagulation    with Xarelto   Complication of anesthesia    PT STATES HARD TO WAKE UP AFTER ONE SUGERY -STATES THE SURGERY TOOK LONGER THAN EXPECTED.  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   Shortness of breath    WITH EXERTION AND PAIN   Sleep apnea    uses CPAP,    Patient Active Problem List   Diagnosis Date Noted   Infection of prosthetic left  knee joint ([place]) 11/08/2019   Septic joint of left knee joint ([place]) 11/06/2019   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[place]) 08/15/2019   Open knee wound 08/14/2019   S/P left TK revision 04/03/2019   Administration of long-term prophylactic antibiotics    History of streptococcal infection    History of DVT (deep vein thrombosis)    S/P rev left TK 12/15/2015   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   Preoperative cardiovascular examination 04/17/2013   OSA on CPAP 11/27/2012   Chronic diastolic heart failure ([place]) 09/10/2012   Chronic anticoagulation, with Xarelto 09/10/2012   DVT (deep venous thrombosis), possible 09/10/2012   SOB (shortness of breath) 08/26/2012   Chest discomfort 08/26/2012   Persistent atrial fibrillation ([place]) 08/26/2012   Super obesity 06/27/2012   Expected blood loss anemia 06/27/2012   S/P left TK revision 06/23/2012   Septic arthritis of knee ([place])    Cellulitis 09/19/2010   METHICILLIN RESISTANT STAPHYLOCOCCUS AUREUS INFECTION 09/10/2009   STREPTOCOCCUS INFECTION CCE & UNS SITE GROUP C 07/04/2008   DM 07/04/2008   CHRONIC KIDNEY DISEASE UNSPECIFIED 07/04/2008   PULMONARY EMBOLISM, HX OF 07/04/2008   INFECTION DUE TO INTERNAL ORTH DEVICE NEC 03/02/2006    Past Surgical History:  Procedure Laterality Date   2010 REMOVAL OF LEFT  TOTAL KNEE     AMPUTATION Left 11/17/2019   Procedure: AMPUTATION ABOVE KNEE;  Surgeon: Newt Minion, MD;  Location: [place];  Service: Orthopedics;  Laterality: Left;   CARDIAC CATHETERIZATION  2006   CARDIOVERSION N/A 08/28/2012   Procedure: CARDIOVERSION;  Surgeon: Sanda Klein, MD;  Location: [place];  Service: Cardiovascular;  Laterality: N/A;   CARDIOVERSION N/A 09/01/2012   Procedure: CARDIOVERSION;  Surgeon: Pixie Casino, MD;  Location: [place];  Service: Cardiovascular;  Laterality: N/A;   COLONOSCOPY N/A 06/13/2013   Procedure: COLONOSCOPY;  Surgeon: Inda Castle, MD;  Location: [facility];  Service: Endoscopy;   Laterality: N/A;   EXCISIONAL TOTAL KNEE ARTHROPLASTY WITH ANTIBIOTIC SPACERS Left 09/21/2018   Procedure: Resection of tibia versus both components with placement of antibiotic spacer;  Surgeon: Paralee Cancel, MD;  Location: [facility];  Service: Orthopedics;  Laterality: Left;  2.5 hrs   EXCISIONAL TOTAL KNEE ARTHROPLASTY WITH ANTIBIOTIC SPACERS Left 01/02/2019   Procedure: Repeat Washout and placement of antibiotic spacer left knee;  Surgeon: Paralee Cancel, MD;  Location: [facility];  Service: Orthopedics;  Laterality: Left;  2 hrs   HYDROCELECTOY   2012   I & D EXTREMITY Left 08/14/2019   Procedure: IRRIGATION AND DEBRIDEMENT LEFT KNEE WOUND;  Surgeon: Paralee Cancel, MD;  Location: [facility];  Service: Orthopedics;  Laterality: Left;  60 mins   I & D KNEE WITH POLY EXCHANGE Left 05/17/2019   Procedure: IRRIGATION AND DEBRIDEMENT KNEE WITH POLY EXCHANGE;  Surgeon: Paralee Cancel, MD;  Location: [facility];  Service: Orthopedics;  Laterality: Left;  90 mins   IRRIGATION AND DEBRIDEMENT KNEE Left 04/03/2019   Procedure: REIMPLANTATION OF LEFT KNEE TIBIAL COMPONENT.;  Surgeon: Paralee Cancel, MD;  Location: [facility];  Service: Orthopedics;  Laterality: Left;  need 120 min   IRRIGATION AND DEBRIDEMENT KNEE Left 11/06/2019   Procedure: IRRIGATION AND DEBRIDEMENT KNEE;  Surgeon: Paralee Cancel, MD;  Location: [facility];  Service: Orthopedics;  Laterality: Left;  90 mins   LEFT KNEE ARTHROSCOPY  1999   LEFT KNEE SURGERY UPPER TIBIAL OSTOMY     LEFT TOTAL KNEE REMOVAL FOR INFECTION  2006   REIMPLANTATION LEFT TOTAL KNEE  2008   REIMPLANTATION LEFT TOTAL KNEE   2006   REIMPLANTATION OF TOTAL KNEE Left 06/23/2012   Procedure: REIMPLANTATION OF LEFT TOTAL KNEE;  Surgeon: Mauri Pole, MD;  Location: [facility];  Service: Orthopedics;  Laterality: Left;   REMOVAL LEFT TOTAL KNEE   2008   REPLACEMENT LEFT KNEE  2002   REPLACEMENT RIGHT KNEE  2003   REVISION LEFT KNEE CAP   2004   RIGHT KNEE ARTHROSCOPY  1998   TEE WITHOUT  CARDIOVERSION N/A 08/28/2012   Procedure: TRANSESOPHAGEAL ECHOCARDIOGRAM (TEE);  Surgeon: Sanda Klein, MD;  Location: [place];  Service: Cardiovascular;  Laterality: N/A;   TONSILLECTOMY     TOTAL KNEE REVISION Left 12/15/2015   Procedure: TOTAL KNEE REVISION REPLACEMENT;  Surgeon: Paralee Cancel, MD;  Location: [facility];  Service: Orthopedics;  Laterality: Left;  Adductor Block       Family History  Problem Relation Age of Onset   Heart disease Father 88   Pancreatic cancer Sister    Liver cancer Sister    Cervical cancer Sister    Breast cancer Sister    Diabetes Sister    Colon cancer Neg Hx    Throat cancer Neg Hx    Stomach cancer Neg Hx    Kidney disease  Neg Hx    Liver disease Neg Hx     Social History   Tobacco Use   Smoking status: Never   Smokeless tobacco: Never  Vaping Use   Vaping Use: Never used  Substance Use Topics   Alcohol use: Not Currently    Comment: not since 1999   Drug use: No    Home Medications Prior to Admission medications   Medication Sig Start Date End Date Taking? Authorizing Provider  cephALEXin (KEFLEX) 500 MG capsule Take 1 capsule (500 mg total) by mouth 4 (four) times daily. 05/27/20   Varney Biles, MD  clotrimazole (LOTRIMIN) 1 % cream Apply to affected area (back of the knee) 2 times daily 04/24/20   Petrucelli, Samantha R, PA-C  diltiazem (CARDIZEM CD) 240 MG 24 hr capsule TAKE 1 CAPSULE BY MOUTH DAILY Patient taking differently: Take 240 mg by mouth daily.  10/10/18   Croitoru, Mihai, MD  doxycycline (VIBRAMYCIN) 100 MG capsule Take 1 capsule (100 mg total) by mouth 2 (two) times daily. 04/24/20   Petrucelli, Samantha R, PA-C  ferrous sulfate (FERROUSUL) 325 (65 FE) MG tablet Take 1 tablet (325 mg total) by mouth 3 (three) times daily with meals for 14 days. Patient taking differently: Take 325 mg by mouth daily.  08/16/19 10/30/20  Danae Orleans, PA-C  furosemide (LASIX) 20 MG tablet Take 20 mg by mouth daily as needed (fluid  retention.).  08/29/19   [provider]  levothyroxine (SYNTHROID) 125 MCG tablet Take 125 mcg by mouth daily before breakfast.  10/31/15   [provider]  methocarbamol (ROBAXIN) 500 MG tablet Take 1 tablet (500 mg total) by mouth every 6 (six) hours as needed for muscle spasms. 08/16/19   Danae Orleans, PA-C  metoprolol tartrate (LOPRESSOR) 50 MG tablet TAKE 1 TABLET BY MOUTH TWICE DAILY Patient taking differently: Take 50 mg by mouth 2 (two) times daily.  12/12/18   Croitoru, Mihai, MD  Multiple Vitamin (MULTIVITAMIN WITH MINERALS) TABS Take 1 tablet by mouth daily.    [provider]  naphazoline-glycerin (CLEAR EYES REDNESS) 0.012-0.2 % SOLN Place 1-2 drops into both eyes 4 (four) times daily as needed for eye irritation. 11/21/19   Persons, Bevely Palmer, PA  oxyCODONE (OXY IR/ROXICODONE) 5 MG immediate release tablet Take 1-2 tablets (5-10 mg total) by mouth every 4 (four) hours as needed for moderate pain (pain score 4-6). 11/20/19   Persons, Bevely Palmer, PA  XARELTO 20 MG TABS tablet TAKE 1 TABLET BY MOUTH DAILY WITH BREAKFAST Patient taking differently: No sig reported 10/08/19   Croitoru, Mihai, MD    Allergies    Morphine  Review of Systems   Review of Systems  Constitutional:  Positive for chills and fever.  HENT:  Negative for rhinorrhea and sore throat.   Eyes:  Negative for redness.  Respiratory:  Positive for cough. Negative for shortness of breath.   Cardiovascular:  Positive for leg swelling. Negative for chest pain.  Gastrointestinal:  Negative for abdominal pain, diarrhea, nausea and vomiting.  Genitourinary:  Negative for dysuria and hematuria.  Musculoskeletal:  Positive for myalgias.  Skin:  Positive for color change. Negative for rash.  Neurological:  Negative for headaches.   Physical Exam Updated Vital Signs BP 123/73 (BP Location: Right Wrist)   Pulse 94   Temp (!) 100.8 F (38.2 C) (Oral)   Resp 20   Ht 5\' 8"  (1.727 m)   Wt (!) 167.4  kg   SpO2 97%   BMI  56.11 kg/m   Physical Exam Vitals and nursing note reviewed.  Constitutional:      General: He is not in acute distress.    Appearance: He is well-developed.  HENT:     Head: Normocephalic and atraumatic.  Eyes:     General:        Right eye: No discharge.        Left eye: No discharge.     Conjunctiva/sclera: Conjunctivae normal.  Cardiovascular:     Rate and Rhythm: Normal rate. Rhythm irregular.     Heart sounds: Normal heart sounds.  Pulmonary:     Effort: Pulmonary effort is normal.     Breath sounds: Normal breath sounds.  Abdominal:     Palpations: Abdomen is soft.     Tenderness: There is no abdominal tenderness.  Musculoskeletal:     Cervical back: Normal range of motion and neck supple.  Skin:    General: Skin is warm and dry.     Comments: End of stump is erythematous, warm to touch, consistent with cellulitis. No drainage. No lymphangitis.   Neurological:     Mental Status: He is alert.    ED Results / Procedures / Treatments   Labs (all labs ordered are listed, but only abnormal results are displayed) Labs Reviewed  LACTIC ACID, PLASMA - Abnormal; Notable for the following components:      Result Value   Lactic Acid, Venous <0.3 (*)    All other components within normal limits  COMPREHENSIVE METABOLIC PANEL - Abnormal; Notable for the following components:   Glucose, Bld 101 (*)    Albumin 3.2 (*)    Total Bilirubin 1.7 (*)    All other components within normal limits  CBC WITH DIFFERENTIAL/PLATELET - Abnormal; Notable for the following components:   RBC 3.69 (*)    Hemoglobin 11.6 (*)    HCT 37.5 (*)    MCV 101.6 (*)    Lymphs Abs 0.6 (*)    All other components within normal limits  PROTIME-INR - Abnormal; Notable for the following components:   Prothrombin Time 30.8 (*)    INR 3.0 (*)    All other components within normal limits  APTT - Abnormal; Notable for the following components:   aPTT 48 (*)    All other components  within normal limits  URINE CULTURE  CULTURE, BLOOD (ROUTINE X 2)  CULTURE, BLOOD (ROUTINE X 2)  LACTIC ACID, PLASMA  URINALYSIS, ROUTINE W REFLEX MICROSCOPIC    EKG EKG Interpretation  Date/Time:  Thursday August 14 2020 14:02:31 EDT Ventricular Rate:  94 PR Interval:    QRS Duration: 115 QT Interval:  342 QTC Calculation: 428 R Axis:   -35 Text Interpretation: Atrial fibrillation Nonspecific intraventricular conduction delay Low voltage, precordial leads Confirmed by Madalyn Rob [phone]) on 08/14/2020 2:28:41 PM  Radiology DG Chest 1 View  Result Date: 08/14/2020 CLINICAL DATA:  Fall, weakness, chills and low-grade fever EXAM: CHEST  1 VIEW COMPARISON:  Radiograph 01/25/2013 FINDINGS: Enlarged cardiomediastinal silhouette. Central pulmonary vascular congestion without overt pulmonary edema. Low lung volumes. There is no large pleural effusion or visible pneumothorax. There is no acute osseous abnormality. IMPRESSION: Cardiomegaly with central pulmonary vascular congestion. Low lung volumes. Electronically Signed   By: Maurine Simmering   On: 08/14/2020 13:54   DG Femur Min 2 Views Left  Result Date: 08/14/2020 CLINICAL DATA:  cough, stump cellulitis EXAM: LEFT FEMUR 2 VIEWS COMPARISON:  Left knee radiograph 04/04/2019 FINDINGS: There has been interval  above knee amputation with residual femoral stem hardware and bony callus. There is soft tissue swelling. There is minimal periosteal reaction without evidence of bony destruction. IMPRESSION: Interval above knee amputation with residual femoral stem hardware can bony callus. Minimal periosteal reaction along the distal femoral stump without evidence of bony destruction. Soft tissue swelling is noted. Electronically Signed   By: Maurine Simmering   On: 08/14/2020 13:52    Procedures Procedures   Medications Ordered in ED Medications - No data to display  ED Course  I have reviewed the triage vital signs and the nursing notes.  Pertinent labs &  imaging results that were available during my care of the patient were reviewed by me and considered in my medical decision making (see chart for details).  Patient seen and examined. Work-up initiated. Medications ordered. Concern for sepsis. Abx ordered. Will likely need admission.   Vital signs reviewed and are as follows: BP 123/73 (BP Location: Right Wrist)   Pulse 94   Temp (!) 100.8 F (38.2 C) (Oral)   Resp 20   Ht 5\' 8"  (1.727 m)   Wt (!) 167.4 kg   SpO2 97%   BMI 56.11 kg/m   3:06 PM patient updated on results to this point.  He is agreeable to admission to the emergency department for IV antibiotics.  Lactate is normal.  Spoke with internal medicine teaching service who will see patient.    MDM Rules/Calculators/A&P                          Admit.    Final Clinical Impression(s) / ED Diagnoses Final diagnoses:  Cellulitis of left leg    Rx / DC Orders ED Discharge Orders     None        Carlisle Cater, PA-C 08/14/20 1507    Lucrezia Starch, MD 08/19/20 1524

## 2020-08-14 NOTE — ED Notes (Signed)
Pt transported to XRAY °

## 2020-08-14 NOTE — ED Notes (Signed)
Patient transported to X-ray 

## 2020-08-14 NOTE — ED Notes (Signed)
Provider at bedside

## 2020-08-14 NOTE — Hospital Course (Addendum)
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 Admitted for cellulitis at amputation site.  Initial XR showed some periosteal reaction, concerning for osteomyelitis. Pt on Vancomycin (7/7) and Ceftriaxone (7/8). Body habitus limited receipt of MRI. 08/15/20 CT L Femur showing 2 fluid pockets as possible abscesses, and no definite evidence of osteomyelitis. Ortho Dr Sharol Given consulted, recommended one week of IV abx followed by PO abx w/ discharge, and clinic f/u on Tu AM 7/19.  Developed open wound at lateral aspect on 7/11. Consulted Ortho again who felt that as long as pt remains aseptic and wound drains, continue course.  08/21/20 CT L Femur showing unchanged fluid pockets vs abscesses after 8 days of IV abx.  Also w/u possible liver failure with macrocytic anemia w/out B vitamin deficiencies, low albumin and total protein, and elevated PT/INR/aPTT. Pt offered further w/u but declines this for now. Asymptomatic.  Dispo follow up: Infection of above-the-knee amputation stump Infection of above-the-knee amputation stump: L.AKA. Received 8 days of IV antibiotics, sent out on p.o. doxycycline and Augmentin.  Underlying abscesses unresolved, but cellulitis has improved. Patient to follow-up with Dr. Sharol Given in office on Tuesday AM.   Iron deficiency anemia: Patient is on iron supplementation.Studies suggest mild IDA/anemia of chronic inflammation, continued oral iron at discharge. Stable Hgb while admitted. Continue to treat and monitor.   Cirrhosis? Elevated Bilirubin likely in setting of infection prompted RUQ [place], nodular hepatic contour was suggestive of liver disease.  Offered further work-up to patient, he declined CT.  Discussed further work-up in the future if patient would like to pursue this.

## 2020-08-14 NOTE — H&P (Addendum)
Date: 08/14/2020               Patient Name:  [name] MRN: [id number]  DOB: [birth year] Age / Sex: 70 y.o., male   PCP: Bernerd Limbo, MD              Medical Service: Internal Medicine Teaching Service              Attending Physician: Dr. Velna Ochs, MD    First Contact: Baldwin [place], MS3 Pager: [phone]  Second Contact: Dr. Tamsen Snider Pager: [phone]            After Hours (After 5p/  First Contact Pager: [phone]  weekends / holidays): Second Contact Pager: [phone]   Chief Complaint: Pain at amputation site  History of Present Illness: [name] is a 70 y/o male with L above the knee amputation, CHF, Afib on chronic anticoagulation Xarelto, GERD, DVT, PE, hypothyroidism, and sleep apnea. He has had several I&Ds for left knee septic arthritis and prior MRSA infection.  The pt notes that he developed warmth, redness, and "achy" pain at the distal aspect of his left leg on Monday. Pain progressed to 8/10 and he came to the ED today; pain was not improved by his outpatient 5mg  Oxycodone q4hrs. He also developed chills and subjective fevers over the last few days. The staff at his facility [facility] on [address] [place] [place]) took his temperature which was 98.61F. The pt also fell twice in the last four days while trying to sit down in a chair, but did not injure himself. He has not moved his bowels in two days which is longer than his usual, but is passing gas. He denies abdominal pain, changes in urination, chest pain, SOB, confusion or any other rashes.  Pt had amputation above the knee on 11/17/19.    Meds: Current Facility-Administered Medications  Medication Dose Route Frequency Provider Last Rate Last Admin   acetaminophen (TYLENOL) tablet 650 mg  650 mg Oral Q6H PRN Madalyn Rob, MD       Or   acetaminophen (TYLENOL) suppository 650 mg  650 mg Rectal Q6H PRN Madalyn Rob, MD       bisacodyl (DULCOLAX) EC tablet 5 mg  5 mg Oral Daily PRN Madalyn Rob, MD        [START ON 08/15/2020] diltiazem (CARDIZEM CD) 24 hr capsule 240 mg  240 mg Oral Daily Madalyn Rob, MD       furosemide (LASIX) tablet 20 mg  20 mg Oral Daily Madalyn Rob, MD       Derrill Memo ON 08/15/2020] levothyroxine (SYNTHROID) tablet 125 mcg  125 mcg Oral QAC breakfast Madalyn Rob, MD       metoprolol tartrate (LOPRESSOR) tablet 50 mg  50 mg Oral BID Madalyn Rob, MD       [START ON 08/15/2020] rivaroxaban (XARELTO) tablet 20 mg  20 mg Oral Q breakfast Madalyn Rob, MD       senna-docusate (Senokot-S) tablet 1 tablet  1 tablet Oral QHS PRN Madalyn Rob, MD       Derrill Memo ON 08/15/2020] vancomycin (VANCOCIN) IVPB 1000 mg/200 mL premix  1,000 mg Intravenous Q12H Madalyn Rob, MD       Current Outpatient Medications  Medication Sig Dispense Refill   diltiazem (CARDIZEM CD) 240 MG 24 hr capsule TAKE 1 CAPSULE BY MOUTH DAILY (Patient taking differently: Take 240 mg by mouth daily.) 90 capsule 3   ferrous sulfate (FERROUSUL) 325 (65 FE) MG tablet Take 1  tablet (325 mg total) by mouth 3 (three) times daily with meals for 14 days. (Patient taking differently: Take 325 mg by mouth daily.) 42 tablet 0   furosemide (LASIX) 20 MG tablet Take 20 mg by mouth daily.     gabapentin (NEURONTIN) 800 MG tablet Take 800 mg by mouth 3 (three) times daily.     levothyroxine (SYNTHROID) 125 MCG tablet Take 125 mcg by mouth daily before breakfast.      metoprolol tartrate (LOPRESSOR) 50 MG tablet TAKE 1 TABLET BY MOUTH TWICE DAILY (Patient taking differently: Take 50 mg by mouth 2 (two) times daily.) 180 tablet 2   Multiple Vitamin (MULTIVITAMIN WITH MINERALS) TABS Take 1 tablet by mouth daily.     naphazoline-glycerin (CLEAR EYES REDNESS) 0.012-0.2 % SOLN Place 1-2 drops into both eyes 4 (four) times daily as needed for eye irritation.  0   XARELTO 20 MG TABS tablet TAKE 1 TABLET BY MOUTH DAILY WITH BREAKFAST (Patient taking differently: Take 20 mg by mouth daily.) 90 tablet 3   clotrimazole (LOTRIMIN) 1 % cream Apply to  affected area (back of the knee) 2 times daily (Patient not taking: Reported on 08/14/2020) 15 g 0   methocarbamol (ROBAXIN) 500 MG tablet Take 1 tablet (500 mg total) by mouth every 6 (six) hours as needed for muscle spasms. (Patient not taking: Reported on 08/14/2020) 40 tablet 0   oxyCODONE (OXY IR/ROXICODONE) 5 MG immediate release tablet Take 1-2 tablets (5-10 mg total) by mouth every 4 (four) hours as needed for moderate pain (pain score 4-6). (Patient not taking: Reported on 08/14/2020) 30 tablet 0    Allergies: Allergies as of 08/14/2020 - Review Complete 08/14/2020  Allergen Reaction Noted   Morphine Hives 08/19/2008   Past Medical History:  Diagnosis Date   CHF (congestive heart failure) ([place]) 05/2019   Chronic anticoagulation    with Xarelto   Complication of anesthesia    PT STATES HARD TO WAKE UP AFTER ONE SUGERY -Tolu.  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   Shortness of breath    WITH EXERTION AND PAIN   Sleep apnea    uses CPAP,   Past Surgical History:  Procedure Laterality Date   2010 REMOVAL OF LEFT TOTAL KNEE     AMPUTATION Left 11/17/2019   Procedure: AMPUTATION ABOVE KNEE;  Surgeon: Newt Minion, MD;  Location: [place];  Service: Orthopedics;  Laterality: Left;   CARDIAC CATHETERIZATION  2006   CARDIOVERSION N/A 08/28/2012   Procedure: CARDIOVERSION;  Surgeon: Sanda Klein, MD;  Location: [place];  Service: Cardiovascular;  Laterality: N/A;   CARDIOVERSION N/A 09/01/2012   Procedure: CARDIOVERSION;  Surgeon: Pixie Casino, MD;  Location: [place];  Service: Cardiovascular;  Laterality: N/A;   COLONOSCOPY N/A 06/13/2013   Procedure: COLONOSCOPY;  Surgeon: Inda Castle, MD;  Location: [facility];  Service: Endoscopy;  Laterality: N/A;   EXCISIONAL TOTAL KNEE ARTHROPLASTY  WITH ANTIBIOTIC SPACERS Left 09/21/2018   Procedure: Resection of tibia versus both components with placement of antibiotic spacer;  Surgeon: Paralee Cancel, MD;  Location: [facility];  Service: Orthopedics;  Laterality: Left;  2.5 hrs   EXCISIONAL TOTAL KNEE ARTHROPLASTY WITH ANTIBIOTIC SPACERS Left 01/02/2019   Procedure: Repeat Washout and placement of antibiotic spacer left knee;  Surgeon: Paralee Cancel, MD;  Location: [facility];  Service: Orthopedics;  Laterality: Left;  2 hrs   HYDROCELECTOY   2012   I & D EXTREMITY Left 08/14/2019   Procedure: IRRIGATION AND DEBRIDEMENT LEFT KNEE WOUND;  Surgeon: Paralee Cancel, MD;  Location: [facility];  Service: Orthopedics;  Laterality: Left;  60 mins   I & D KNEE WITH POLY EXCHANGE Left 05/17/2019   Procedure: IRRIGATION AND DEBRIDEMENT KNEE WITH POLY EXCHANGE;  Surgeon: Paralee Cancel, MD;  Location: [facility];  Service: Orthopedics;  Laterality: Left;  90 mins   IRRIGATION AND DEBRIDEMENT KNEE Left 04/03/2019   Procedure: REIMPLANTATION OF LEFT KNEE TIBIAL COMPONENT.;  Surgeon: Paralee Cancel, MD;  Location: [facility];  Service: Orthopedics;  Laterality: Left;  need 120 min   IRRIGATION AND DEBRIDEMENT KNEE Left 11/06/2019   Procedure: IRRIGATION AND DEBRIDEMENT KNEE;  Surgeon: Paralee Cancel, MD;  Location: [facility];  Service: Orthopedics;  Laterality: Left;  90 mins   LEFT KNEE ARTHROSCOPY  1999   LEFT KNEE SURGERY UPPER TIBIAL OSTOMY     LEFT TOTAL KNEE REMOVAL FOR INFECTION  2006   REIMPLANTATION LEFT TOTAL KNEE  2008   REIMPLANTATION LEFT TOTAL KNEE   2006   REIMPLANTATION OF TOTAL KNEE Left 06/23/2012   Procedure: REIMPLANTATION OF LEFT TOTAL KNEE;  Surgeon: Mauri Pole, MD;  Location: [facility];  Service: Orthopedics;   Laterality: Left;   REMOVAL LEFT TOTAL KNEE   2008   REPLACEMENT LEFT KNEE  2002   REPLACEMENT RIGHT KNEE  2003   REVISION LEFT KNEE CAP   2004   RIGHT KNEE ARTHROSCOPY  1998   TEE WITHOUT CARDIOVERSION N/A 08/28/2012   Procedure: TRANSESOPHAGEAL ECHOCARDIOGRAM (TEE);  Surgeon: Sanda Klein, MD;  Location: [place];  Service: Cardiovascular;  Laterality: N/A;   TONSILLECTOMY     TOTAL KNEE REVISION Left 12/15/2015   Procedure: TOTAL KNEE REVISION REPLACEMENT;  Surgeon: Paralee Cancel, MD;  Location: [facility];  Service: Orthopedics;  Laterality: Left;  Adductor Block   Family History  Problem Relation Age of Onset   Heart disease Father 61   Pancreatic cancer Sister    Liver cancer Sister    Cervical cancer Sister    Breast cancer Sister    Diabetes Sister    Colon cancer Neg Hx    Throat cancer Neg Hx    Stomach cancer Neg Hx    Kidney disease Neg Hx    Liver disease Neg Hx    Social History   Socioeconomic History   Marital status: Single    Spouse name: Not on file   Number of children: Not on file   Years of education: Not on file   Highest education level: Not on file  Occupational History   Not on file  Tobacco Use   Smoking status: Never   Smokeless tobacco: Never  Vaping Use   Vaping Use: Never used  Substance and Sexual Activity   Alcohol use: Not Currently    Comment: not since 1999   Drug use: No   Sexual activity: Yes    Partners: Female  Other Topics Concern  Not on file  Social History Narrative   Not on file   Social Determinants of Health   Financial Resource Strain: Not on file  Food Insecurity: Not on file  Transportation Needs: Not on file  Physical Activity: Not on file  Stress: Not on file  Social Connections: Not on file  Intimate Partner Violence: Not on file    Review of Systems: Pertinent items noted in HPI and remainder of comprehensive ROS otherwise negative.  Physical Exam: Blood pressure 112/76, pulse 98, temperature (!) 100.8  F (38.2 C), temperature source Oral, resp. rate (!) 28, height 5\' 8"  (1.727 m), weight (!) 167.4 kg, SpO2 95 %.  General: Pleasant, well-appearing middle-age man laying in bed. No acute distress. Head: Normocephalic. Atraumatic. CV: normal rate, irregularly irregular rhythm, No murmurs, rubs, or gallops. RLE with compression stocking. Pulmonary: Lungs CTAB. Normal effort. No wheezing or rales. Abdominal: Soft, nontender, nondistended. Normal bowel sounds. Extremities: Palpable pulses. Normal ROM. L AKA Skin: L distal stump warm, tender and erythematous.  Neuro: A&Ox3. Moves all extremities. Normal sensation. No focal deficit. Psych: Normal mood and affect  Imaging results:  DG Chest 1 View  Result Date: 08/14/2020 CLINICAL DATA:  Fall, weakness, chills and low-grade fever EXAM: CHEST  1 VIEW COMPARISON:  Radiograph 01/25/2013 FINDINGS: Enlarged cardiomediastinal silhouette. Central pulmonary vascular congestion without overt pulmonary edema. Low lung volumes. There is no large pleural effusion or visible pneumothorax. There is no acute osseous abnormality. IMPRESSION: Cardiomegaly with central pulmonary vascular congestion. Low lung volumes. Electronically Signed   By: Maurine Simmering   On: 08/14/2020 13:54   DG Femur Min 2 Views Left  Result Date: 08/14/2020 CLINICAL DATA:  cough, stump cellulitis EXAM: LEFT FEMUR 2 VIEWS COMPARISON:  Left knee radiograph 04/04/2019 FINDINGS: There has been interval above knee amputation with residual femoral stem hardware and bony callus. There is soft tissue swelling. There is minimal periosteal reaction without evidence of bony destruction. IMPRESSION: Interval above knee amputation with residual femoral stem hardware can bony callus. Minimal periosteal reaction along the distal femoral stump without evidence of bony destruction. Soft tissue swelling is noted. Electronically Signed   By: Maurine Simmering   On: 08/14/2020 13:52    Other results: EKG: Rate 90 bpm,  Afib, Axis at -30. No signs of ischemia.   Assessment & Plan by Problem: Active Problems:   Cellulitis  Cellulitis Pt with erythematous, warm, tender rash over the distal aspect (stump) of his left leg with AKA. Has extensive hx of septic arthritis and I&Ds with Ortho, with multiple surgeries in the last year. Low-grade fever at 100.8 without hemodynamic instability or signs of dehydration. XR of the L femur showed minimal periosteal reaction along the distal femoral stump w/out evidence of bony destruction, in addition to soft tissue swelling. Will obtain MRI to better evaluate for osteomyelitis.  -Continue 1000mg  Vancomycin (begun in ED) -Will monitor rash and nurse did outline borders with marker to track improvement -Will check A1C for possibly undiagnosed DM given recurrent infections and body habitus  CHF Last available ECHO from 2014 showed EF 50-55%. CXR today showed pulmonary vascular congestion. Takes Lasix 20mg  as needed for volume overload. Per chart review, his CHF has been difficult to assess due to severe obesity. Wearing compression stocking on RLE today.  -Will do 20mg  Lasix daily and monitor fluid status -Continue Cardizem 240mg  daily -Continue Metoprolol tartate 50mg  BID  Afib Severely dilated L [facility] per Cardiology notes. Continues on 20mg  Xarelto long term (also  has hx of recurrent venous thromboembolic disease). -Continue 20mg  Xarelto daily  Constipation -Begin Senokot-S for mild constipation and Dulcolax prn for moderated constipation   Macrocytic anemia CBC today showed Hgb at 11.6 (up from previous values in 2021 in 8s) with MCV at 101.6. Has been taking Ferrous sulfate. Will check iron studies to see if patient needs continuation of iron supplementation.  -Will check B12, Folate, Iron and TIBC and Ferritin   This is a Careers information officer Note.  The care of the patient was discussed with Dr. Court Joy and the assessment and plan was formulated with their assistance.   Please see their note for official documentation of the patient encounter.    Attestation for Student Documentation:  I personally was present and performed or re-performed the history, physical exam and medical decision-making activities of this service and have verified that the service and findings are accurately documented in the student's note.  Madalyn Rob, MD 08/14/2020, 6:38 PM

## 2020-08-14 NOTE — ED Triage Notes (Signed)
Fell onto left leg AKA stump today causing pain,has been experiencing Weakness with chills and low grade fever x 1 week. Hx of afib with afib and PVC's noted on monitor.

## 2020-08-14 NOTE — Progress Notes (Signed)
RN to assist pt. With placing on cpap mask.

## 2020-08-14 NOTE — Progress Notes (Signed)
Pt seen in room, alert/oriented in no apparent distress. Welcome guide/menu provided with instructions, Pt verbalized understanding of instructions. Hospital valuables policy has benn discussed with no complaints. Hospital bed in lowest position with 3 side rails up, room phone//call bell within reach,  and all wheels locked.

## 2020-08-14 NOTE — ED Notes (Signed)
Pt chest shaved in order to obtain EKG and continue cardiac monitoring.

## 2020-08-14 NOTE — ED Notes (Signed)
Pt notified of the need for a urine sample, pt stated that he will let someone know when he needs to urinate.

## 2020-08-15 ENCOUNTER — Observation Stay (HOSPITAL_COMMUNITY): Payer: Medicare HMO

## 2020-08-15 DIAGNOSIS — T8740 Infection of amputation stump, unspecified extremity: Secondary | ICD-10-CM

## 2020-08-15 LAB — HEMOGLOBIN A1C
Hgb A1c MFr Bld: 5.3 % (ref 4.8–5.6)
Mean Plasma Glucose: 105.41 mg/dL

## 2020-08-15 LAB — CBC WITH DIFFERENTIAL/PLATELET
Abs Immature Granulocytes: 0.02 10*3/uL (ref 0.00–0.07)
Basophils Absolute: 0 10*3/uL (ref 0.0–0.1)
Basophils Relative: 0 %
Eosinophils Absolute: 0.1 10*3/uL (ref 0.0–0.5)
Eosinophils Relative: 1 %
HCT: 33.1 % — ABNORMAL LOW (ref 39.0–52.0)
Hemoglobin: 10.4 g/dL — ABNORMAL LOW (ref 13.0–17.0)
Immature Granulocytes: 0 %
Lymphocytes Relative: 13 %
Lymphs Abs: 0.7 10*3/uL (ref 0.7–4.0)
MCH: 32.1 pg (ref 26.0–34.0)
MCHC: 31.4 g/dL (ref 30.0–36.0)
MCV: 102.2 fL — ABNORMAL HIGH (ref 80.0–100.0)
Monocytes Absolute: 0.8 10*3/uL (ref 0.1–1.0)
Monocytes Relative: 16 %
Neutro Abs: 3.5 10*3/uL (ref 1.7–7.7)
Neutrophils Relative %: 70 %
Platelets: 157 10*3/uL (ref 150–400)
RBC: 3.24 MIL/uL — ABNORMAL LOW (ref 4.22–5.81)
RDW: 15.1 % (ref 11.5–15.5)
WBC: 5.1 10*3/uL (ref 4.0–10.5)
nRBC: 0 % (ref 0.0–0.2)

## 2020-08-15 LAB — URINALYSIS, ROUTINE W REFLEX MICROSCOPIC
Bilirubin Urine: NEGATIVE
Glucose, UA: NEGATIVE mg/dL
Hgb urine dipstick: NEGATIVE
Ketones, ur: NEGATIVE mg/dL
Leukocytes,Ua: NEGATIVE
Nitrite: NEGATIVE
Protein, ur: NEGATIVE mg/dL
Specific Gravity, Urine: 1.023 (ref 1.005–1.030)
pH: 5 (ref 5.0–8.0)

## 2020-08-15 LAB — COMPREHENSIVE METABOLIC PANEL
ALT: 15 U/L (ref 0–44)
AST: 19 U/L (ref 15–41)
Albumin: 2.6 g/dL — ABNORMAL LOW (ref 3.5–5.0)
Alkaline Phosphatase: 58 U/L (ref 38–126)
Anion gap: 8 (ref 5–15)
BUN: 18 mg/dL (ref 8–23)
CO2: 24 mmol/L (ref 22–32)
Calcium: 8.9 mg/dL (ref 8.9–10.3)
Chloride: 105 mmol/L (ref 98–111)
Creatinine, Ser: 1.19 mg/dL (ref 0.61–1.24)
GFR, Estimated: >60 mL/min (ref >60)
Glucose, Bld: 108 mg/dL — ABNORMAL HIGH (ref 70–99)
Potassium: 4.3 mmol/L (ref 3.5–5.1)
Sodium: 137 mmol/L (ref 135–145)
Total Bilirubin: 1.8 mg/dL — ABNORMAL HIGH (ref 0.3–1.2)
Total Protein: 5.7 g/dL — ABNORMAL LOW (ref 6.5–8.1)

## 2020-08-15 LAB — BILIRUBIN, FRACTIONATED(TOT/DIR/INDIR)
Bilirubin, Direct: 0.4 mg/dL — ABNORMAL HIGH (ref 0.0–0.2)
Indirect Bilirubin: 1.3 mg/dL — ABNORMAL HIGH (ref 0.3–0.9)
Total Bilirubin: 1.7 mg/dL — ABNORMAL HIGH (ref 0.3–1.2)

## 2020-08-15 LAB — C-REACTIVE PROTEIN: CRP: 25.9 mg/dL — ABNORMAL HIGH (ref <1.0)

## 2020-08-15 LAB — VITAMIN B12: Vitamin B-12: 709 pg/mL (ref 180–914)

## 2020-08-15 LAB — SEDIMENTATION RATE: Sed Rate: 96 mm/hr — ABNORMAL HIGH (ref 0–16)

## 2020-08-15 LAB — IRON AND TIBC
Iron: 22 ug/dL — ABNORMAL LOW (ref 45–182)
Saturation Ratios: 7 % — ABNORMAL LOW (ref 17.9–39.5)
TIBC: 335 ug/dL (ref 250–450)
UIBC: 313 ug/dL

## 2020-08-15 LAB — FOLATE: Folate: 32 ng/mL (ref >5.9)

## 2020-08-15 LAB — FERRITIN: Ferritin: 221 ng/mL (ref 24–336)

## 2020-08-15 MED ORDER — HEPARIN (PORCINE) 25000 UT/250ML-% IV SOLN
1650.0000 [IU]/h | INTRAVENOUS | Status: DC
  Filled 2020-08-15: qty 250

## 2020-08-15 MED ORDER — SODIUM CHLORIDE 0.9 % IV SOLN
2.0000 g | INTRAVENOUS | Status: DC
  Administered 2020-08-15 – 2020-08-16 (×2): 2 g via INTRAVENOUS
  Filled 2020-08-15 (×2): qty 20

## 2020-08-15 NOTE — Consult Note (Signed)
ORTHOPAEDIC CONSULTATION  REQUESTING PHYSICIAN: Velna Ochs, MD  Chief Complaint: Cellulitis left above-the-knee amputation.  HPI: [name] is a 70 y.o. male who presents with cellulitis left above-the-knee amputation.  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  In order to maintain length of his femur for possible prosthetic fitting and for sitting balance the most proximal tip of the implant was left in the femur to maintain the length of the femur.  The proximal aspect of the femoral implant was not near the zone of knee infection.  Past Medical History:  Diagnosis Date   CHF (congestive heart failure) ([place]) 05/2019   Chronic anticoagulation    with Xarelto   Complication of anesthesia    PT STATES HARD TO WAKE UP AFTER ONE SUGERY -STATES THE SURGERY TOOK LONGER THAN EXPECTED.  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   Shortness of breath    WITH EXERTION AND PAIN   Sleep apnea    uses CPAP,   Past Surgical History:  Procedure Laterality Date   2010 REMOVAL OF LEFT TOTAL KNEE     AMPUTATION Left 11/17/2019   Procedure: AMPUTATION ABOVE KNEE;  Surgeon: Newt Minion, MD;  Location: [place];  Service: Orthopedics;  Laterality: Left;   CARDIAC CATHETERIZATION  2006   CARDIOVERSION N/A 08/28/2012   Procedure: CARDIOVERSION;  Surgeon: Sanda Klein, MD;  Location: [place];  Service: Cardiovascular;  Laterality: N/A;   CARDIOVERSION N/A 09/01/2012   Procedure: CARDIOVERSION;  Surgeon: Pixie Casino, MD;   Location: [facility];  Service: Cardiovascular;  Laterality: N/A;   COLONOSCOPY N/A 06/13/2013   Procedure: COLONOSCOPY;  Surgeon: Inda Castle, MD;  [facility];  Service: Endoscopy;  Laterality: N/A;   EXCISIONAL TOTAL KNEE ARTHROPLASTY WITH ANTIBIOTIC SPACERS Left 09/21/2018   Procedure: Resection of tibia versus both components with placement of antibiotic spacer;  Surgeon: Paralee Cancel, MD;  Location: [facility];  Service: Orthopedics;  Laterality: Left;  2.5 hrs   EXCISIONAL TOTAL KNEE ARTHROPLASTY WITH ANTIBIOTIC SPACERS Left 01/02/2019   Procedure: Repeat Washout and placement of antibiotic spacer left knee;  Surgeon: Paralee Cancel, MD;  Location: [facility];  Service: Orthopedics;  Laterality: Left;  2 hrs   HYDROCELECTOY   2012   I & D EXTREMITY Left 08/14/2019   Procedure: IRRIGATION AND DEBRIDEMENT LEFT KNEE WOUND;  Surgeon: Paralee Cancel, MD;  Location: [facility];  Service: Orthopedics;  Laterality: Left;  60 mins   I & D KNEE WITH POLY EXCHANGE Left 05/17/2019   Procedure: IRRIGATION AND DEBRIDEMENT KNEE WITH POLY EXCHANGE;  Surgeon: Paralee Cancel, MD;  Location: [facility];  Service: Orthopedics;  Laterality: Left;  90 mins   IRRIGATION AND DEBRIDEMENT KNEE Left 04/03/2019   Procedure: REIMPLANTATION OF LEFT KNEE TIBIAL COMPONENT.;  Surgeon: Paralee Cancel, MD;  Location: [facility];  Service: Orthopedics;  Laterality: Left;  need 120 min   IRRIGATION AND  DEBRIDEMENT KNEE Left 11/06/2019   Procedure: IRRIGATION AND DEBRIDEMENT KNEE;  Surgeon: Paralee Cancel, MD;  Location: [facility];  Service: Orthopedics;  Laterality: Left;  90 mins   LEFT KNEE ARTHROSCOPY  1999   LEFT KNEE SURGERY UPPER TIBIAL OSTOMY     LEFT TOTAL KNEE REMOVAL FOR INFECTION  2006   REIMPLANTATION LEFT TOTAL KNEE  2008   REIMPLANTATION LEFT TOTAL KNEE   2006   REIMPLANTATION OF TOTAL KNEE Left 06/23/2012   Procedure: REIMPLANTATION OF LEFT TOTAL KNEE;  Surgeon: Mauri Pole, MD;  Location[facility];  Service: Orthopedics;  Laterality:  Left;   REMOVAL LEFT TOTAL KNEE   2008   REPLACEMENT LEFT KNEE  2002   REPLACEMENT RIGHT KNEE  2003   REVISION LEFT KNEE CAP   2004   RIGHT KNEE ARTHROSCOPY  1998   TEE WITHOUT CARDIOVERSION N/A 08/28/2012   Procedure: TRANSESOPHAGEAL ECHOCARDIOGRAM (TEE);  Surgeon: Sanda Klein, MD;  Location: [place];  Service: Cardiovascular;  Laterality: N/A;   TONSILLECTOMY     TOTAL KNEE REVISION Left 12/15/2015   Procedure: TOTAL KNEE REVISION REPLACEMENT;  Surgeon: Paralee Cancel, MD;  Location[facility];  Service: Orthopedics;  Laterality: Left;  Adductor Block   Social History   Socioeconomic History   Marital status: Single    Spouse name: Not on file   Number of children: Not on file   Years of education: Not on file   Highest education level: Not on file  Occupational History   Not on file  Tobacco Use   Smoking status: Never   Smokeless tobacco: Never  Vaping Use   Vaping Use: Never used  Substance and Sexual Activity   Alcohol use: Not Currently    Comment: not since 1999   Drug use: No   Sexual activity: Yes    Partners: Female  Other Topics Concern   Not on file  Social History Narrative   Not on file   Social Determinants of Health   Financial Resource Strain: Not on file  Food Insecurity: Not on file  Transportation Needs: Not on file  Physical Activity: Not on file  Stress: Not on file  Social Connections: Not on file   Family History  Problem Relation Age of Onset   Heart disease Father 41   Pancreatic cancer Sister    Liver cancer Sister    Cervical cancer Sister    Breast cancer Sister    Diabetes Sister    Colon cancer Neg Hx    Throat cancer Neg Hx    Stomach cancer Neg Hx    Kidney disease Neg Hx    Liver disease Neg Hx    - negative except otherwise stated in the family history section Allergies  Allergen Reactions   Morphine Hives   Prior to Admission medications   Medication Sig Start Date End Date Taking? Authorizing Provider  diltiazem  (CARDIZEM CD) 240 MG 24 hr capsule TAKE 1 CAPSULE BY MOUTH DAILY Patient taking differently: Take 240 mg by mouth daily. 10/10/18  Yes Croitoru, Mihai, MD  ferrous sulfate (FERROUSUL) 325 (65 FE) MG tablet Take 1 tablet (325 mg total) by mouth 3 (three) times daily with meals for 14 days. Patient taking differently: Take 325 mg by mouth daily. 08/16/19 10/30/20 Yes Babish, Rodman Key, PA-C  furosemide (LASIX) 20 MG tablet Take 20 mg by mouth daily. 07/28/16  Yes [provider]  gabapentin (NEURONTIN) 800 MG tablet Take 800 mg by mouth 3 (three) times  daily. 07/21/20  Yes [provider]  levothyroxine (SYNTHROID) 125 MCG tablet Take 125 mcg by mouth daily before breakfast.  10/31/15  Yes [provider]  metoprolol tartrate (LOPRESSOR) 50 MG tablet TAKE 1 TABLET BY MOUTH TWICE DAILY Patient taking differently: Take 50 mg by mouth 2 (two) times daily. 12/12/18  Yes Croitoru, Mihai, MD  Multiple Vitamin (MULTIVITAMIN WITH MINERALS) TABS Take 1 tablet by mouth daily.   Yes [provider]  naphazoline-glycerin (CLEAR EYES REDNESS) 0.012-0.2 % SOLN Place 1-2 drops into both eyes 4 (four) times daily as needed for eye irritation. 11/21/19  Yes Persons, Bevely Palmer, PA  XARELTO 20 MG TABS tablet TAKE 1 TABLET BY MOUTH DAILY WITH BREAKFAST Patient taking differently: Take 20 mg by mouth daily. 10/08/19  Yes Croitoru, Mihai, MD  clotrimazole (LOTRIMIN) 1 % cream Apply to affected area (back of the knee) 2 times daily Patient not taking: Reported on 08/14/2020 04/24/20   Petrucelli, Glynda Jaeger, PA-C  methocarbamol (ROBAXIN) 500 MG tablet Take 1 tablet (500 mg total) by mouth every 6 (six) hours as needed for muscle spasms. Patient not taking: Reported on 08/14/2020 08/16/19   Danae Orleans, PA-C  oxyCODONE (OXY IR/ROXICODONE) 5 MG immediate release tablet Take 1-2 tablets (5-10 mg total) by mouth every 4 (four) hours as needed for moderate pain (pain score 4-6). Patient not taking: Reported  on 08/14/2020 11/20/19   Persons, Bevely Palmer, [place]   DG Chest 1 View  Result Date: 08/14/2020 CLINICAL DATA:  Fall, weakness, chills and low-grade fever EXAM: CHEST  1 VIEW COMPARISON:  Radiograph 01/25/2013 FINDINGS: Enlarged cardiomediastinal silhouette. Central pulmonary vascular congestion without overt pulmonary edema. Low lung volumes. There is no large pleural effusion or visible pneumothorax. There is no acute osseous abnormality. IMPRESSION: Cardiomegaly with central pulmonary vascular congestion. Low lung volumes. Electronically Signed   By: Maurine Simmering   On: 08/14/2020 13:54   CT FEMUR LEFT WO CONTRAST  Result Date: 08/15/2020 CLINICAL DATA:  Cellulitis.  Concern for osteomyelitis EXAM: CT OF THE LOWER LEFT EXTREMITY WITHOUT CONTRAST TECHNIQUE: Multidetector CT imaging of the lower left extremity was performed according to the standard protocol. COMPARISON:  X-ray 08/14/2020 FINDINGS: Bones/Joint/Cartilage Postsurgical changes from above knee amputation of the left lower extremity. Residual femoral stem hardware is seen in the distal femoral diaphysis. Bony callus formation and adjacent heterotopic ossification along the posteromedial margin of the distal femoral resection margin. No definite bony destructive changes. No fracture or dislocation. Hip joint is intact. Ligaments Suboptimally assessed by CT. Muscles and Tendons Mild atrophy of the thigh musculature. Post amputation changes musculotendinous structures distally. No intramuscular fluid collection is seen. Soft tissues Soft tissue swelling at the distal stump. Low-density collection abutting the resection margin of the distal femur measuring approximately 8.1 x 2.3 x 6.1 cm (series 10, image 106). Additional collection within the soft tissues more distally at the distal stump measures approximately 4.6 x 2.6 x 4.0 cm (series 10, image 120). Adjacent soft tissue edema. Skin thickening of the distal stump. No soft tissue gas. Enlarged left  inguinal lymph nodes measuring up to 2.6 cm short axis. IMPRESSION: 1. Postsurgical changes from above knee amputation of the left lower extremity. Skin thickening with soft tissue swelling and edema at the distal stump is concerning for cellulitis. No definite evidence of acute osteomyelitis by CT. 2. Low-density collection abutting the resection margin of the distal femur measuring up to 8.1 cm, nonspecific and may represent postoperative seroma or abscess. 3. Additional  collection more distally within the soft tissues of the distal stump measuring up to 4.6 cm with surrounding edema. 4. Enlarged left inguinal lymph nodes measuring up to 2.6 cm short axis, likely reactive. Electronically Signed   By: Davina Poke D.O.   On: 08/15/2020 15:25   DG Femur Min 2 Views Left  Result Date: 08/14/2020 CLINICAL DATA:  cough, stump cellulitis EXAM: LEFT FEMUR 2 VIEWS COMPARISON:  Left knee radiograph 04/04/2019 FINDINGS: There has been interval above knee amputation with residual femoral stem hardware and bony callus. There is soft tissue swelling. There is minimal periosteal reaction without evidence of bony destruction. IMPRESSION: Interval above knee amputation with residual femoral stem hardware can bony callus. Minimal periosteal reaction along the distal femoral stump without evidence of bony destruction. Soft tissue swelling is noted. Electronically Signed   By: Maurine Simmering   On: 08/14/2020 13:52   US Abdomen Limited RUQ (LIVER/GB)  Result Date: 08/15/2020 CLINICAL DATA:  Elevated bilirubin in a 70 year old male. EXAM: ULTRASOUND ABDOMEN LIMITED RIGHT UPPER QUADRANT COMPARISON:  No recent comparison is available. Most recent study for comparison a CT evaluation from December of 2014. FINDINGS: Gallbladder: Question of wall thickening of the gallbladder. Structures poorly evaluated due to patient body habitus. Wall thickness may be as much as 5 mm though the gallbladder is contracted. Cholelithiasis with 1.2  cm gallstone and other small gallstones layering dependently. No reported tenderness over the gallbladder based on sonographer assessment. Common bile duct: Diameter: 6.3 mm. Liver: Liver is very poorly evaluated due to beam attenuation and patient body habitus. Suggestion of nodular hepatic contours raising the question of liver disease. Anechoic lesion in the anterior LEFT liver shows enhanced through transmission and is well-circumscribed along the anterior surface of the liver measuring 0.9 x 1.3 x 1.0 cm. Portal vein is patent on color Doppler imaging with normal direction of blood flow towards the liver. Other: No visible ascites. IMPRESSION: Very limited evaluation shows contracted gallbladder with thickening of the wall more likely related to under distension of the gallbladder. There is evidence of cholelithiasis but no sign of tenderness by report over the gallbladder. Nodular hepatic contour is suggested raising the question of liver disease. Parenchyma is very poorly evaluated due to patient body habitus. CT may be helpful to evaluate both the gallbladder and liver. Electronically Signed   By: Zetta Bills M.D.   On: 08/15/2020 15:03   - pertinent xrays, CT, MRI studies were reviewed and independently interpreted  Positive ROS: All other systems have been reviewed and were otherwise negative with the exception of those mentioned in the HPI and as above.  Physical Exam: General: Alert, no acute distress Psychiatric: Patient is competent for consent with normal mood and affect Lymphatic: No axillary or cervical lymphadenopathy Cardiovascular: No pedal edema Respiratory: No cyanosis, no use of accessory musculature GI: No organomegaly, abdomen is soft and non-tender    Images:  @ENCIMAGES @  Labs:  Lab Results  Component Value Date   HGBA1C 5.3 08/15/2020   HGBA1C 5.5 11/02/2019   HGBA1C 5.3 08/26/2012   ESRSEDRATE 64 (H) 02/12/2019   ESRSEDRATE 53 (H) 02/05/2019   ESRSEDRATE 75  (H) 01/29/2019   CRP 25.9 (H) 08/15/2020   CRP 4.1 (H) 02/12/2019   CRP 3.9 (H) 02/05/2019   REPTSTATUS PENDING 08/14/2020   GRAMSTAIN  11/06/2019    RARE WBC PRESENT,BOTH PMN AND MONONUCLEAR RARE GRAM POSITIVE COCCI IN PAIRS Performed at [facility], [place] [address] [place], [place]  [place]  11/06/2019    ABUNDANT WBC PRESENT,BOTH PMN AND MONONUCLEAR FEW GRAM POSITIVE COCCI    CULT  08/14/2020    NO GROWTH < 24 HOURS Performed at [facility] [address] [place], [place] [address]    LABORGA ENTEROCOCCUS FAECALIS 11/06/2019    Lab Results  Component Value Date   ALBUMIN 2.6 (L) 08/15/2020   ALBUMIN 3.2 (L) 08/14/2020   ALBUMIN 3.4 (L) 05/14/2019     CBC EXTENDED Latest Ref Rng & Units 08/15/2020 08/14/2020 11/18/2019  WBC 4.0 - 10.5 K/uL 5.1 5.6 -  RBC 4.22 - 5.81 MIL/uL 3.24(L) 3.69(L) -  HGB 13.0 - 17.0 g/dL 10.4(L) 11.6(L) 8.4(L)  HCT 39.0 - 52.0 % 33.1(L) 37.5(L) 27.9(L)  PLT 150 - 400 K/uL 157 165 -  NEUTROABS 1.7 - 7.7 K/uL 3.5 4.1 -  LYMPHSABS 0.7 - 4.0 K/uL 0.7 0.6(L) -    Neurologic: Patient does not have protective sensation bilateral lower extremities.   MUSCULOSKELETAL:   Skin: Examination patient has cellulitis involving the entire residual left above-the-knee amputation.  There is no draining abscess no ulcers.  Patient has healed the surgical incision quite well.  Review of the radiographs do not show any lytic changes between the tip of the retained hardware and the bone, however there is some hypertrophic callus on the medial aspect of the femur.  There is hypertrophic bone is consistent with the interoperative findings.  In reviewing previous three-phase bone scans the area of amputation was not involved in the area of infection.  Patient's white blood cell count is 5.1 and the C-reactive protein is elevated at 25.9  Assessment: Assessment: Cellulitis left above-the-knee amputation, 9 months status post surgery,  with a normal white cell count and elevated C-reactive protein  Plan: Discussed with the patient we have 2 options.  I could proceed with a revision of the above-the-knee amputation tomorrow morning or he could try a weeks worth of IV antibiotics to see if the cellulitis resolves.  The radiographs do not appear consistent with chronic osteomyelitis and a more proximal amputation would make it difficult for sitting balance.  Patient states he would like to try IV antibiotics for a week and I will follow-up a week from Monday to reevaluate for possible surgical intervention.  Thank you for the consult and the opportunity to see [name], [place] [phone] 5:27 PM

## 2020-08-15 NOTE — Progress Notes (Addendum)
ANTICOAGULATION CONSULT NOTE - Initial Consult  Pharmacy Consult for Heparin Indication: atrial fibrillation and pulmonary embolus  Allergies  Allergen Reactions   Morphine Hives    Patient Measurements: Height: 5\' 8"  (172.7 cm) Weight: (!) 167.4 kg (369 lb) IBW/kg (Calculated) : 68.4 Heparin Dosing Weight: 110kg  Vital Signs: Temp: 99.5 F (37.5 C) (07/08 0841) Temp Source: Oral (07/08 0841) BP: 104/55 (07/08 0841) Pulse Rate: 88 (07/08 0841)  Labs: Recent Labs    08/14/20 1254 08/15/20 0259  HGB 11.6* 10.4*  HCT 37.5* 33.1*  PLT 165 157  APTT 48*  --   LABPROT 30.8*  --   INR 3.0*  --   CREATININE 1.22 1.19    Estimated Creatinine Clearance: 89.5 mL/min (by C-G formula based on SCr of 1.19 mg/dL).   Medical History: Past Medical History:  Diagnosis Date   CHF (congestive heart failure) ([place]) 05/2019   Chronic anticoagulation    with Xarelto   Complication of anesthesia    PT STATES HARD TO WAKE UP AFTER ONE SUGERY -[place].  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 (Patient taking differently: Take 325 mg by mouth daily.) 42 tablet 0 08/14/2020   furosemide (LASIX) 20 MG tablet Take 20 mg by mouth daily.   08/14/2020   gabapentin (NEURONTIN) 800 MG tablet Take 800 mg by mouth 3 (three) times daily.   08/14/2020   levothyroxine (SYNTHROID) 125 MCG tablet Take 125 mcg by mouth daily before breakfast.    08/14/2020   metoprolol tartrate (LOPRESSOR) 50 MG tablet TAKE 1 TABLET BY MOUTH TWICE DAILY (Patient taking differently: Take 50 mg by mouth 2 (two) times daily.) 180 tablet 2 08/14/2020 at 0800   Multiple Vitamin (MULTIVITAMIN WITH MINERALS) TABS Take 1 tablet by mouth daily.   08/14/2020   naphazoline-glycerin (CLEAR EYES REDNESS) 0.012-0.2 % SOLN Place 1-2 drops into both eyes 4 (four) times daily as needed for eye irritation.  0 unknown   XARELTO 20 MG TABS tablet TAKE 1 TABLET BY MOUTH DAILY WITH BREAKFAST (Patient taking differently: Take 20 mg by mouth daily.) 90 tablet 3 08/14/2020 at 0800   clotrimazole (LOTRIMIN) 1 % cream Apply to affected area (back of the knee) 2 times daily (Patient not taking: Reported on 08/14/2020) 15 g 0 Not Taking   methocarbamol (ROBAXIN) 500 MG tablet Take 1 tablet (500 mg total) by mouth every 6 (  six) hours as needed for muscle spasms. (Patient not taking: Reported on 08/14/2020) 40 tablet 0 Not Taking   oxyCODONE (OXY IR/ROXICODONE) 5 MG immediate release tablet Take 1-2 tablets (5-10 mg total) by mouth every 4 (four) hours as needed for moderate pain (pain score 4-6). (Patient not taking: Reported on 08/14/2020) 30 tablet 0 Not Taking   Scheduled:   diltiazem  240 mg Oral Daily   furosemide  20 mg Oral Daily   levothyroxine  125 mcg Oral QAC breakfast   metoprolol tartrate  50 mg Oral BID   rivaroxaban  20 mg Oral Q breakfast   Infusions:   cefTRIAXone (ROCEPHIN)  IV 2 g (08/15/20 1015)   vancomycin 1,000 mg (08/15/20 0881)    Assessment: Pt with a hx of PE and afib who was admitted for pain at amputation site. Ortho has been  consulted. Xarelto is being transition to heparin for possible procedure. We will monitor with PTT until level is correlated. Unfortunately, he got a dose this AM so will start bridge tomorrow.    Baseline PTT 48, INR 3 d/t Xarelto Hgb 10.4, plt 157 Last dose of Xarelto on 7/8 @0835   Goal of Therapy:  Heparin level 0.3-0.7 units/ml aPTT 66-102 seconds Monitor platelets by anticoagulation protocol: Yes   Plan:  Heparin infusion 1650 units/hr in AM Check 6 hr PTT Daily PTT/HL  Onnie Boer, PharmD, BCIDP, AAHIVP, CPP Infectious Disease Pharmacist 08/15/2020 4:29 PM

## 2020-08-15 NOTE — Care Management Obs Status (Signed)
[place] NOTIFICATION   Patient Details  Name: Adryan Shin MRN: [id number] Date of Birth: 18-May-1950   Medicare Observation Status Notification Given:  Yes  Explained medicare observation, patient stated he is not able to quite understand this, at this time, , he does not want anyone else to be called.   Verdell Carmine, RN 08/15/2020, 4:58 PM

## 2020-08-15 NOTE — Progress Notes (Addendum)
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   The pt was seen this AM and notes that he slept well overnight with the exception of feeling warm. He notes that his initial pain at the site of infection is improved after beginning the antibiotics. He has not moved his bowels yet but senses he will be able to this AM. Endorses good appetite. In response to questions about any SOB pt notes he is "always SOB," but felt well on current [place].   Objective: Vital signs in last 24 hours: Vitals:   08/14/20 1836 08/14/20 2238 08/15/20 0223 08/15/20 0841  BP: (!) 115/94 130/77 124/72 (!) 104/55  Pulse: (!) 106 82 95 88  Resp: 20 20 (!) 24 15  Temp: 99.2 F (37.3 C) 100.2 F (37.9 C) 99.8 F (37.7 C) 99.5 F (37.5 C)  TempSrc: Oral Oral Axillary Oral  SpO2: 93% 98% 92% 92%  Weight:      Height:       Weight change:   Intake/Output Summary (Last 24 hours) at 08/15/2020 1349 Last data filed at 08/15/2020 1243 Gross per 24 hour  Intake 531.71 ml  Output 1050 ml  Net -518.29 ml   General: Pleasant, well-appearing middle-aged gentleman laying in bed around 15 degrees. No acute distress. Head: Normocephalic. Atraumatic. CV: Irregularly irregular. No murmurs, rubs, or gallops.  Pulmonary: Lungs CTAB. Normal effort. No wheezing or rales. On [place]. Abdominal: Soft, nontender, nondistended. Normal bowel sounds. Extremities: Palpable pulses. Normal ROM. Skin: Warm and dry. No obvious rash or lesions. Neuro: A&Ox3. Moves all extremities. Normal sensation. No focal deficit. Psych: Normal mood and affect   Micro Results: Recent Results (from the past 240 hour(s))  Blood culture (routine x 2)     Status: None (Preliminary result)   Collection Time: 08/14/20  2:11 PM   Specimen: BLOOD  Result Value Ref Range Status   Specimen  Description BLOOD SITE NOT SPECIFIED  Final   Special Requests   Final    BOTTLES DRAWN AEROBIC AND ANAEROBIC Blood Culture results may not be optimal due to an excessive volume of blood received in culture bottles   Culture   Final    NO GROWTH < 24 HOURS Performed at [facility], [place] [address], [place], [place] [address]    Report Status PENDING  Incomplete  Blood culture (routine x 2)     Status: None (Preliminary result)   Collection Time: 08/14/20  2:24 PM   Specimen: BLOOD  Result Value Ref Range Status   Specimen Description BLOOD SITE NOT SPECIFIED  Final   Special Requests   Final    BOTTLES DRAWN AEROBIC AND ANAEROBIC Blood Culture results may not be optimal due to an excessive volume of blood received in culture bottles   Culture   Final    NO GROWTH < 24 HOURS Performed at [facility], [place] [address] [place], [place] [address]    Report Status PENDING  Incomplete  SARS CORONAVIRUS 2 (TAT 6-24 HRS) Nasopharyngeal Nasopharyngeal Swab     Status: None   Collection Time: 08/14/20  5:02 PM   Specimen: Nasopharyngeal Swab  Result Value Ref Range Status   SARS Coronavirus 2 NEGATIVE NEGATIVE Final    Comment: (NOTE) SARS-CoV-2 target nucleic acids are NOT DETECTED.  The SARS-CoV-2 RNA is  generally detectable in upper and lower respiratory specimens during the acute phase of infection. Negative results do not preclude SARS-CoV-2 infection, do not rule out co-infections with other pathogens, and should not be used as the sole basis for treatment or other patient management decisions. Negative results must be combined with clinical observations, patient history, and epidemiological information. The expected result is Negative.  Fact Sheet for Patients: SugarRoll.be  Fact Sheet for Healthcare Providers: https://www.woods-mathews.com/  This test is not yet approved or cleared by the [place] FDA and  has been  authorized for detection and/or diagnosis of SARS-CoV-2 by FDA under an Emergency Use Authorization (EUA). This EUA will remain  in effect (meaning this test can be used) for the duration of the COVID-19 declaration under Se ction 564(b)(1) of the Act, 21 U.S.C. section 360bbb-3(b)(1), unless the authorization is terminated or revoked sooner.  Performed at [facility], [place] [address], [place], [place] [address]    Studies/Results: DG Chest 1 View  Result Date: 08/14/2020 CLINICAL DATA:  Fall, weakness, chills and low-grade fever EXAM: CHEST  1 VIEW COMPARISON:  Radiograph 01/25/2013 FINDINGS: Enlarged cardiomediastinal silhouette. Central pulmonary vascular congestion without overt pulmonary edema. Low lung volumes. There is no large pleural effusion or visible pneumothorax. There is no acute osseous abnormality. IMPRESSION: Cardiomegaly with central pulmonary vascular congestion. Low lung volumes. Electronically Signed   By: Maurine Simmering   On: 08/14/2020 13:54   DG Femur Min 2 Views Left  Result Date: 08/14/2020 CLINICAL DATA:  cough, stump cellulitis EXAM: LEFT FEMUR 2 VIEWS COMPARISON:  Left knee radiograph 04/04/2019 FINDINGS: There has been interval above knee amputation with residual femoral stem hardware and bony callus. There is soft tissue swelling. There is minimal periosteal reaction without evidence of bony destruction. IMPRESSION: Interval above knee amputation with residual femoral stem hardware can bony callus. Minimal periosteal reaction along the distal femoral stump without evidence of bony destruction. Soft tissue swelling is noted. Electronically Signed   By: Maurine Simmering   On: 08/14/2020 13:52   Medications: I have reviewed the patient's current medications. Scheduled Meds:  diltiazem  240 mg Oral Daily   furosemide  20 mg Oral Daily   levothyroxine  125 mcg Oral QAC breakfast   metoprolol tartrate  50 mg Oral BID   rivaroxaban  20 mg Oral Q breakfast   Continuous  Infusions:  cefTRIAXone (ROCEPHIN)  IV 2 g (08/15/20 1015)   vancomycin 1,000 mg (08/15/20 0609)   PRN Meds:.acetaminophen **OR** acetaminophen, bisacodyl, senna-docusate Assessment/Plan: Active Problems:   Cellulitis  Cellulitis Pt with erythematous, warm rash over distal aspect of amputation site of L leg (AKA), minimally improved since yesterday. Afebrile since initial ED temp of 100.8. No leukocytosis. Pt with extensive history of septic arthritis, I&Ds and multiple total knee replacements for septic arthritis. Review of last 5 synovial fluid cultures (09/21/18-11/06/19) revealed Gram positive cocci and Amp/Gent/Vanc sensitive Enterococcus faecalis. Left Femur XR yesterday showed periosteal reaction at distal aspect. MRI not able to be done due to body habitus.  -Continue 1040m Vancomycin BID -Adding 2g Ceftriaxone daily for possible osteomyelitis and broader coverage -Ordered CRP and ESR for further characterization of possible osteomyelitis. -Will f/u with CT L femur w/o contrast  CHF Benign physical exam this AM, though limited by body habitus. CXR yesterday showed pulmonary vascular congestion.  -Continue 273mLasix daily. K at 4.3. -Continue 50108metoprolol tartate 34m51mD  Afib Severely dilated L [facility] per Cards notes. On chronic anticoagulation (also  for recurrent venous thromboembolic disease) -Continue 295m Cardizem -Continue 283mXarelto  Macrocytic anemia CBC this AM showed HGB down to 10.4 from 11.6 yesterday with MCV of 102.2. Pt has been taking ferrous sulfate as outpatient. Overnight labs showed normal B12 at 709, Folate normal at 32, Ferritin normal at 221, Iron low at 22 with a 7% Saturation ratio. In light of macrocytic anemia without B12 or folate deficiency, suspect anemia of chronic disease, as discussed further below.  Isolated hyperbilirubinemia, mild  Total bilirubin increased at 1.7, in setting of Total protein down to 5.7, Albumin at low at 2.6, initial  PT/INR/aPTT all elevated at 30.8/3.0/48, and a macrocytic anemia without B vitamin deficiencies, do suspect there is an element of liver failure. Direct bilirubin slightly elevated at 0.4 and Indirect Bilirubin elevated at 1.3. Pt's social hx not high risk for alcoholic fatty liver disease. Pt's A1C today (and previously) normal at 5.3, make NAFLD felt less likely. Could have hepatic congestion from R sided HF (no available ECHO in last 8 years). Nevertheless would like to rule out possible hepatic etiologies. Hemolysis also on the differential but no current signs or symptoms. -Ordered RUQ USKoreaHypothyroidism -Continue 12533mLevothyroxine   Constipation -Senna S daily prn for mild constipation, and Dulcolax daily prn for moderate constipation   This is a [employer] information officerte.  The care of the patient was discussed with Dr. GuiPhilipp Ovensd the assessment and plan formulated with their assistance.  Please see their attached note for official documentation of the daily encounter.   LOS: 0 days   [place] Pepperedical Student 08/15/2020, 1:49 PM

## 2020-08-15 NOTE — TOC Initial Note (Signed)
Transition of Care [facility]) - Initial/Assessment Note    Patient Details  Name: [name] MRN: [id number] Date of Birth: August 03, 1950  [facility] CM/SW Contact:    Verdell Carmine, RN Phone Number: 08/15/2020, 4:31 PM  Clinical Narrative:                  Admitted from assisted living, fell there and was transported via EMS for evaluation, on xaralto for atrial fibrillation. Cellulitis of stump, on IV antibiotics. Plan to DC back to [facility] ,perhaps with PT, on oxygen currently, may need this as well post admission. CM will follow for needs.   Expected Discharge Plan: Assisted Living Barriers to Discharge: Continued Medical Work up   Patient Goals and CMS Choice        Expected Discharge Plan and Services Expected Discharge Plan: Assisted Living       Living arrangements for the past 2 months: [place]                                      Prior Living Arrangements/Services Living arrangements for the past 2 months: [place] Lives with:: Self Patient language and need for interpreter reviewed:: Yes        Need for Family Participation in Patient Care: Yes (Comment) Care giver support system in place?: Yes (comment)   Criminal Activity/Legal Involvement Pertinent to Current Situation/Hospitalization: No - Comment as needed  Activities of Daily Living      Permission Sought/Granted                  Emotional Assessment       Orientation: : Oriented to Place, Oriented to Self, Oriented to  Time, Oriented to Situation Alcohol / Substance Use: Not Applicable Psych Involvement: No (comment)  Admission diagnosis:  Cough [R05.9] Cellulitis [L03.90] Cellulitis of left leg [L03.116] Patient Active Problem List   Diagnosis Date Noted   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[place])    Infection of prosthetic left knee joint ([place]) 11/08/2019   Septic joint of left knee joint ([place]) 11/06/2019   Infection of  total knee replacement ([place]) 08/15/2019   Open knee wound 08/14/2019   S/P left TK revision 04/03/2019   Administration of long-term prophylactic antibiotics    History of streptococcal infection    History of DVT (deep vein thrombosis)    S/P rev left TK 12/15/2015   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   Preoperative cardiovascular examination 04/17/2013   OSA on CPAP 11/27/2012   Chronic diastolic heart failure ([place]) 09/10/2012   Chronic anticoagulation, with Xarelto 09/10/2012   DVT (deep venous thrombosis), possible 09/10/2012   SOB (shortness of breath) 08/26/2012   Chest discomfort 08/26/2012   Persistent atrial fibrillation ([place]) 08/26/2012   Super obesity 06/27/2012   Expected blood loss anemia 06/27/2012   S/P left TK revision 06/23/2012   Septic arthritis of knee ([place])    Cellulitis 09/19/2010   METHICILLIN RESISTANT STAPHYLOCOCCUS AUREUS INFECTION 09/10/2009   STREPTOCOCCUS INFECTION CCE & UNS SITE GROUP C 07/04/2008   DM 07/04/2008   CHRONIC KIDNEY DISEASE UNSPECIFIED 07/04/2008   PULMONARY EMBOLISM, HX OF 07/04/2008   INFECTION DUE TO INTERNAL ORTH DEVICE NEC 03/02/2006   PCP:  Bernerd Limbo, MD Pharmacy:   [facility], [place] - [address] [place]  [place] Phone: [phone] Fax: [phone]     Social Determinants of Health (SDOH) Interventions    Readmission Risk Interventions Readmission Risk Prevention Plan 11/09/2019  Transportation Screening Complete  PCP or Specialist Appt within 5-7 Days Complete  Home Care Screening Complete  Medication Review (RN CM) Complete  Some recent data might be hidden

## 2020-08-16 ENCOUNTER — Encounter (HOSPITAL_COMMUNITY): Payer: Self-pay | Admitting: Internal Medicine

## 2020-08-16 DIAGNOSIS — E039 Hypothyroidism, unspecified: Secondary | ICD-10-CM | POA: Diagnosis present

## 2020-08-16 DIAGNOSIS — T8740 Infection of amputation stump, unspecified extremity: Secondary | ICD-10-CM | POA: Diagnosis not present

## 2020-08-16 DIAGNOSIS — W19XXXA Unspecified fall, initial encounter: Secondary | ICD-10-CM | POA: Diagnosis present

## 2020-08-16 DIAGNOSIS — Z8249 Family history of ischemic heart disease and other diseases of the circulatory system: Secondary | ICD-10-CM | POA: Diagnosis not present

## 2020-08-16 DIAGNOSIS — L03116 Cellulitis of left lower limb: Secondary | ICD-10-CM | POA: Diagnosis present

## 2020-08-16 DIAGNOSIS — Z7901 Long term (current) use of anticoagulants: Secondary | ICD-10-CM | POA: Diagnosis not present

## 2020-08-16 DIAGNOSIS — I493 Ventricular premature depolarization: Secondary | ICD-10-CM | POA: Diagnosis present

## 2020-08-16 DIAGNOSIS — D539 Nutritional anemia, unspecified: Secondary | ICD-10-CM | POA: Diagnosis present

## 2020-08-16 DIAGNOSIS — K5900 Constipation, unspecified: Secondary | ICD-10-CM | POA: Diagnosis present

## 2020-08-16 DIAGNOSIS — Z79899 Other long term (current) drug therapy: Secondary | ICD-10-CM | POA: Diagnosis not present

## 2020-08-16 DIAGNOSIS — Z66 Do not resuscitate: Secondary | ICD-10-CM | POA: Diagnosis present

## 2020-08-16 DIAGNOSIS — I4819 Other persistent atrial fibrillation: Secondary | ICD-10-CM | POA: Diagnosis present

## 2020-08-16 DIAGNOSIS — Y835 Amputation of limb(s) as the cause of abnormal reaction of the patient, or of later complication, without mention of misadventure at the time of the procedure: Secondary | ICD-10-CM | POA: Diagnosis present

## 2020-08-16 DIAGNOSIS — Z89612 Acquired absence of left leg above knee: Secondary | ICD-10-CM | POA: Diagnosis not present

## 2020-08-16 DIAGNOSIS — Z8049 Family history of malignant neoplasm of other genital organs: Secondary | ICD-10-CM | POA: Diagnosis not present

## 2020-08-16 DIAGNOSIS — D509 Iron deficiency anemia, unspecified: Secondary | ICD-10-CM | POA: Diagnosis present

## 2020-08-16 DIAGNOSIS — Z6841 Body Mass Index (BMI) 40.0 and over, adult: Secondary | ICD-10-CM | POA: Diagnosis not present

## 2020-08-16 DIAGNOSIS — Z800 Family history of malignant neoplasm of digestive organs: Secondary | ICD-10-CM | POA: Diagnosis not present

## 2020-08-16 DIAGNOSIS — I5032 Chronic diastolic (congestive) heart failure: Secondary | ICD-10-CM | POA: Diagnosis present

## 2020-08-16 DIAGNOSIS — Z803 Family history of malignant neoplasm of breast: Secondary | ICD-10-CM | POA: Diagnosis not present

## 2020-08-16 DIAGNOSIS — Z885 Allergy status to narcotic agent status: Secondary | ICD-10-CM | POA: Diagnosis not present

## 2020-08-16 DIAGNOSIS — R296 Repeated falls: Secondary | ICD-10-CM | POA: Diagnosis present

## 2020-08-16 DIAGNOSIS — T8744 Infection of amputation stump, left lower extremity: Secondary | ICD-10-CM | POA: Diagnosis present

## 2020-08-16 DIAGNOSIS — Z20822 Contact with and (suspected) exposure to covid-19: Secondary | ICD-10-CM | POA: Diagnosis present

## 2020-08-16 DIAGNOSIS — G4733 Obstructive sleep apnea (adult) (pediatric): Secondary | ICD-10-CM | POA: Diagnosis present

## 2020-08-16 DIAGNOSIS — K219 Gastro-esophageal reflux disease without esophagitis: Secondary | ICD-10-CM | POA: Diagnosis present

## 2020-08-16 LAB — CBC
HCT: 32 % — ABNORMAL LOW (ref 39.0–52.0)
Hemoglobin: 10.3 g/dL — ABNORMAL LOW (ref 13.0–17.0)
MCH: 31.4 pg (ref 26.0–34.0)
MCHC: 32.2 g/dL (ref 30.0–36.0)
MCV: 97.6 fL (ref 80.0–100.0)
Platelets: 149 10*3/uL — ABNORMAL LOW (ref 150–400)
RBC: 3.28 MIL/uL — ABNORMAL LOW (ref 4.22–5.81)
RDW: 14.6 % (ref 11.5–15.5)
WBC: 4.7 10*3/uL (ref 4.0–10.5)
nRBC: 0 % (ref 0.0–0.2)

## 2020-08-16 LAB — COMPREHENSIVE METABOLIC PANEL
ALT: 17 U/L (ref 0–44)
AST: 19 U/L (ref 15–41)
Albumin: 2.5 g/dL — ABNORMAL LOW (ref 3.5–5.0)
Alkaline Phosphatase: 62 U/L (ref 38–126)
Anion gap: 6 (ref 5–15)
BUN: 14 mg/dL (ref 8–23)
CO2: 24 mmol/L (ref 22–32)
Calcium: 9 mg/dL (ref 8.9–10.3)
Chloride: 106 mmol/L (ref 98–111)
Creatinine, Ser: 0.99 mg/dL (ref 0.61–1.24)
GFR, Estimated: >60 mL/min (ref >60)
Glucose, Bld: 111 mg/dL — ABNORMAL HIGH (ref 70–99)
Potassium: 3.8 mmol/L (ref 3.5–5.1)
Sodium: 136 mmol/L (ref 135–145)
Total Bilirubin: 1.1 mg/dL (ref 0.3–1.2)
Total Protein: 6.2 g/dL — ABNORMAL LOW (ref 6.5–8.1)

## 2020-08-16 LAB — GLUCOSE, CAPILLARY: Glucose-Capillary: 99 mg/dL (ref 70–99)

## 2020-08-16 LAB — URINE CULTURE: Culture: NO GROWTH

## 2020-08-16 MED ORDER — IPRATROPIUM-ALBUTEROL 0.5-2.5 (3) MG/3ML IN SOLN
3.0000 mL | Freq: Three times a day (TID) | RESPIRATORY_TRACT | Status: DC
  Administered 2020-08-16: 3 mL via RESPIRATORY_TRACT
  Filled 2020-08-16 (×2): qty 3

## 2020-08-16 MED ORDER — RIVAROXABAN 20 MG PO TABS
20.0000 mg | ORAL_TABLET | Freq: Every day | ORAL | Status: DC
  Administered 2020-08-16 – 2020-08-23 (×8): 20 mg via ORAL
  Filled 2020-08-16 (×8): qty 1

## 2020-08-16 MED ORDER — BISACODYL 5 MG PO TBEC
5.0000 mg | DELAYED_RELEASE_TABLET | Freq: Every day | ORAL | Status: DC
  Administered 2020-08-17: 5 mg via ORAL
  Filled 2020-08-16 (×2): qty 1

## 2020-08-16 MED ORDER — POTASSIUM CHLORIDE CRYS ER 20 MEQ PO TBCR
20.0000 meq | EXTENDED_RELEASE_TABLET | Freq: Once | ORAL | Status: AC
  Administered 2020-08-16: 20 meq via ORAL
  Filled 2020-08-16: qty 1

## 2020-08-16 MED ORDER — SODIUM CHLORIDE 0.9 % IV SOLN: 2.0000 g | INTRAVENOUS | Status: DC

## 2020-08-16 MED ORDER — POTASSIUM CHLORIDE CRYS ER 20 MEQ PO TBCR: 20.0000 meq | EXTENDED_RELEASE_TABLET | Freq: Every day | ORAL | Status: DC

## 2020-08-16 MED ORDER — SODIUM CHLORIDE 0.9 % IV SOLN
2.0000 g | INTRAVENOUS | Status: DC
  Administered 2020-08-17 – 2020-08-22 (×6): 2 g via INTRAVENOUS
  Filled 2020-08-16 (×6): qty 20

## 2020-08-16 MED ORDER — IPRATROPIUM-ALBUTEROL 0.5-2.5 (3) MG/3ML IN SOLN
3.0000 mL | Freq: Four times a day (QID) | RESPIRATORY_TRACT | Status: DC
  Administered 2020-08-16: 3 mL via RESPIRATORY_TRACT
  Filled 2020-08-16: qty 3

## 2020-08-16 MED ORDER — SODIUM CHLORIDE 0.9 % IV SOLN: 2.0000 g | Freq: Three times a day (TID) | INTRAVENOUS | Status: DC

## 2020-08-16 MED ORDER — VANCOMYCIN HCL 1250 MG/250ML IV SOLN
1250.0000 mg | Freq: Two times a day (BID) | INTRAVENOUS | Status: DC
  Administered 2020-08-16 – 2020-08-22 (×12): 1250 mg via INTRAVENOUS
  Filled 2020-08-16 (×12): qty 250

## 2020-08-16 MED ORDER — SENNOSIDES-DOCUSATE SODIUM 8.6-50 MG PO TABS
1.0000 | ORAL_TABLET | Freq: Every day | ORAL | Status: DC
  Filled 2020-08-16: qty 1

## 2020-08-16 NOTE — Progress Notes (Signed)
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 Currently receiving Vancomycin and Ceftriaxone.  Pt notes that he is doing well this AM and had good conversations with his Orthopaedist, Dr. Sharol Given, both yesterday afternoon and this AM. Dr. Sharol Given noted that cellulitis is responding well to treatment and that further surgery is not indicated at this time.  The pt notes that he is not in pain, is eating well, and is breathing well. He has not had a bowel movement since admission but did not know he could ask for his prn Senna S and Dulcolax. Denies CP, abdominal pain, and SOB.   The pt received a RUQ [place] for evaluation of possible cirrhosis yesterday. Further direct questioning about social history revealed that the pt has not had any alcohol since 1999, however, he did consume around 6 drinks most nights of the week for roughly 6 years prior to 1999. In additional conversation about the possibility of cirrhosis, the pt noted that he would not like to pursue further w/u at this time, but would like to think about it more during this admission. He is also DNR.  Objective: Vital signs in last 24 hours: Vitals:   08/15/20 1700 08/15/20 2159 08/16/20 0300 08/16/20 0700  BP: 127/74 133/78  124/77  Pulse: (!) 103 89  98  Resp: '18 18  19  ' Temp: 100.2 F (37.9 C) (!) 100.6 F (38.1 C) 99 F (37.2 C) 99.5 F (37.5 C)  TempSrc: Oral Oral Oral Oral  SpO2: 92% 93%  96%  Weight:      Height:       Weight change:   Intake/Output Summary (Last 24 hours) at 08/16/2020 0944 Last data filed at 08/16/2020 0347 Gross per 24 hour  Intake 315 ml  Output 1300 ml  Net -985 ml   General: Pleasant, well-appearing middle-age gentleman laying comfortably in bed. No acute distress. Head: Normocephalic. Atraumatic. CV:  Irregularly irregular. No murmurs, rubs, or gallops. No LE edema, wearing compression stocking on R. Pulmonary: Lungs CTAB. Normal effort. Diffuse wheezing. Abdominal: Soft, nontender, nondistended. Normal bowel sounds. Extremities: Palpable pulses. Normal ROM. L AKA. Skin: Warm, erythematous, indurated skin circumferentially around amputation site, now extending 7" from distal aspect Neuro: A&Ox3. Moves all extremities. Normal sensation. No focal deficit. Psych: Normal mood and affect   Micro Results: Recent Results (from the past 240 hour(s))  Blood culture (routine x 2)     Status: None (Preliminary result)   Collection Time: 08/14/20  2:11 PM   Specimen: BLOOD  Result Value Ref Range Status   Specimen Description BLOOD SITE NOT SPECIFIED  Final   Special Requests   Final    BOTTLES DRAWN AEROBIC AND ANAEROBIC Blood Culture results may not be optimal due to an excessive volume of blood received in culture bottles   Culture   Final    NO GROWTH 2 DAYS Performed at [facility], [place] [address] [place], [place] [address]    Report Status PENDING  Incomplete  Blood culture (routine x 2)     Status: None (Preliminary result)   Collection Time: 08/14/20  2:24 PM   Specimen: BLOOD  Result Value Ref Range Status   Specimen Description BLOOD SITE NOT SPECIFIED  Final   Special Requests   Final    BOTTLES  DRAWN AEROBIC AND ANAEROBIC Blood Culture results may not be optimal due to an excessive volume of blood received in culture bottles   Culture   Final    NO GROWTH 2 DAYS Performed at [facility] [address] [place], [place] [address]    Report Status PENDING  Incomplete  SARS CORONAVIRUS 2 (TAT 6-24 HRS) Nasopharyngeal Nasopharyngeal Swab     Status: None   Collection Time: 08/14/20  5:02 PM   Specimen: Nasopharyngeal Swab  Result Value Ref Range Status   SARS Coronavirus 2 NEGATIVE NEGATIVE Final    Comment: (NOTE) SARS-CoV-2 target nucleic acids are NOT  DETECTED.  The SARS-CoV-2 RNA is generally detectable in upper and lower respiratory specimens during the acute phase of infection. Negative results do not preclude SARS-CoV-2 infection, do not rule out co-infections with other pathogens, and should not be used as the sole basis for treatment or other patient management decisions. Negative results must be combined with clinical observations, patient history, and epidemiological information. The expected result is Negative.  Fact Sheet for Patients: SugarRoll.be  Fact Sheet for Healthcare Providers: https://www.woods-mathews.com/  This test is not yet approved or cleared by the [place] FDA and  has been authorized for detection and/or diagnosis of SARS-CoV-2 by FDA under an Emergency Use Authorization (EUA). This EUA will remain  in effect (meaning this test can be used) for the duration of the COVID-19 declaration under Se ction 564(b)(1) of the Act, 21 U.S.C. section 360bbb-3(b)(1), unless the authorization is terminated or revoked sooner.  Performed at [facility], [place] [address], [place], [place] [address]    Studies/Results: DG Chest 1 View  Result Date: 08/14/2020 CLINICAL DATA:  Fall, weakness, chills and low-grade fever EXAM: CHEST  1 VIEW COMPARISON:  Radiograph 01/25/2013 FINDINGS: Enlarged cardiomediastinal silhouette. Central pulmonary vascular congestion without overt pulmonary edema. Low lung volumes. There is no large pleural effusion or visible pneumothorax. There is no acute osseous abnormality. IMPRESSION: Cardiomegaly with central pulmonary vascular congestion. Low lung volumes. Electronically Signed   By: Maurine Simmering   On: 08/14/2020 13:54   CT FEMUR LEFT WO CONTRAST  Result Date: 08/15/2020 CLINICAL DATA:  Cellulitis.  Concern for osteomyelitis EXAM: CT OF THE LOWER LEFT EXTREMITY WITHOUT CONTRAST TECHNIQUE: Multidetector CT imaging of the lower left extremity  was performed according to the standard protocol. COMPARISON:  X-ray 08/14/2020 FINDINGS: Bones/Joint/Cartilage Postsurgical changes from above knee amputation of the left lower extremity. Residual femoral stem hardware is seen in the distal femoral diaphysis. Bony callus formation and adjacent heterotopic ossification along the posteromedial margin of the distal femoral resection margin. No definite bony destructive changes. No fracture or dislocation. Hip joint is intact. Ligaments Suboptimally assessed by CT. Muscles and Tendons Mild atrophy of the thigh musculature. Post amputation changes musculotendinous structures distally. No intramuscular fluid collection is seen. Soft tissues Soft tissue swelling at the distal stump. Low-density collection abutting the resection margin of the distal femur measuring approximately 8.1 x 2.3 x 6.1 cm (series 10, image 106). Additional collection within the soft tissues more distally at the distal stump measures approximately 4.6 x 2.6 x 4.0 cm (series 10, image 120). Adjacent soft tissue edema. Skin thickening of the distal stump. No soft tissue gas. Enlarged left inguinal lymph nodes measuring up to 2.6 cm short axis. IMPRESSION: 1. Postsurgical changes from above knee amputation of the left lower extremity. Skin thickening with soft tissue swelling and edema at the distal stump is concerning for  cellulitis. No definite evidence of acute osteomyelitis by CT. 2. Low-density collection abutting the resection margin of the distal femur measuring up to 8.1 cm, nonspecific and may represent postoperative seroma or abscess. 3. Additional collection more distally within the soft tissues of the distal stump measuring up to 4.6 cm with surrounding edema. 4. Enlarged left inguinal lymph nodes measuring up to 2.6 cm short axis, likely reactive. Electronically Signed   By: Davina Poke D.O.   On: 08/15/2020 15:25   DG Femur Min 2 Views Left  Result Date: 08/14/2020 CLINICAL DATA:   cough, stump cellulitis EXAM: LEFT FEMUR 2 VIEWS COMPARISON:  Left knee radiograph 04/04/2019 FINDINGS: There has been interval above knee amputation with residual femoral stem hardware and bony callus. There is soft tissue swelling. There is minimal periosteal reaction without evidence of bony destruction. IMPRESSION: Interval above knee amputation with residual femoral stem hardware can bony callus. Minimal periosteal reaction along the distal femoral stump without evidence of bony destruction. Soft tissue swelling is noted. Electronically Signed   By: Maurine Simmering   On: 08/14/2020 13:52   US Abdomen Limited RUQ (LIVER/GB)  Result Date: 08/15/2020 CLINICAL DATA:  Elevated bilirubin in a 70 year old male. EXAM: ULTRASOUND ABDOMEN LIMITED RIGHT UPPER QUADRANT COMPARISON:  No recent comparison is available. Most recent study for comparison a CT evaluation from December of 2014. FINDINGS: Gallbladder: Question of wall thickening of the gallbladder. Structures poorly evaluated due to patient body habitus. Wall thickness may be as much as 5 mm though the gallbladder is contracted. Cholelithiasis with 1.2 cm gallstone and other small gallstones layering dependently. No reported tenderness over the gallbladder based on sonographer assessment. Common bile duct: Diameter: 6.3 mm. Liver: Liver is very poorly evaluated due to beam attenuation and patient body habitus. Suggestion of nodular hepatic contours raising the question of liver disease. Anechoic lesion in the anterior LEFT liver shows enhanced through transmission and is well-circumscribed along the anterior surface of the liver measuring 0.9 x 1.3 x 1.0 cm. Portal vein is patent on color Doppler imaging with normal direction of blood flow towards the liver. Other: No visible ascites. IMPRESSION: Very limited evaluation shows contracted gallbladder with thickening of the wall more likely related to under distension of the gallbladder. There is evidence of  cholelithiasis but no sign of tenderness by report over the gallbladder. Nodular hepatic contour is suggested raising the question of liver disease. Parenchyma is very poorly evaluated due to patient body habitus. CT may be helpful to evaluate both the gallbladder and liver. Electronically Signed   By: Zetta Bills M.D.   On: 08/15/2020 15:03   Medications: I have reviewed the patient's current medications. 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 Less warm, erythematous and indurated than yesterday. Continues to be afebrile. No leukocytosis. ESR and CRP elevated yesterday at 96 and 25.9, respectively. MRI ordered yesterday but not able to be received due to body habitus. CT L Femur showed "No definite evidence of acute osteomyelitis," 8cm seroma vs abscess abutting resection site of distal femur, and additional 4.5cm fluid collection w/in the soft tissue.  Consulted pt's Orthopaedist, Dr. Sharol Given, who evaluated the pt clinically and pt's XR, CT, and does not suspect chronic osteomyelitis and therefore does not recommend surgical intervention at this time. Dr Sharol Given recommended continuing IV abx for one week, discharge with PO abx, and f/u in clinic in 2  weeks. -Continue 1031m Vancomycin BID -Continue 20068mCeftriaxone daily  -Trending CRP and ESR in additional to clinical evaluation for cellulitis resolution  Possible cirrhosis Have been working up findings on CBC and CMP consistent with liver failure (Macrocytic anemia without Vitamin B deficiencies; mild isolated hyperbilirubinemia with elevated direct and indirect bilirubin; low synthetic function indicated by mild thrombocytopenia, low albumin, increased PT, INR, aPTT). RUQ [place]day showed liver to be "very poorly evaluated," but with nodular hepatic contours suggestive of liver disease. No jaundice, scleral icterus or ascites. Discussed new RUQ US[place]dings with pt and inquired further about alcohol use history, as above in last paragraph of Subjective. Prior alcohol use more likely as current etiology. NAFLD felt less likely given no history of DM and A1C at 5.3. No ECHO in 8 years but known hx of HF as possible etiology as well.  -Pt will consider further evaluation with CT, but defined conservative goals of care, in keeping with his DNR status.  CHF Benign physical exam this AM, no crackles nor RLE edema appreciated. K decreased to 3.8 from 4.8 on admission. Creatinine stable at 0.99. -Continue 2045masix today.  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 Transitioned pt from Xarelto to Heparin yesterday for possible surgery, which is now not indicated. -Transition back to 16mg X54mto today -Continue 240mg Ca17mem  Wheezing Soft wheezing with expiration during conversation, and diffuse wheezing appreciated on auscultation. Pulse ox showing 96% on RA.  -Continue prn 2LNC for O2 sat >92% -Ordered Ipratropium-albuterol 3mL, q6h25mfor 6 doses  Hypothyroidism -Continue 125mcg Lev24mroxine  Constipation Unaware of his prn laxatives. Hasn't stooled in  three days. -Ordered Senna S and Dulcolax today, and made pt aware of prn available laxatives  This is a Medical StCareers information officere care of the patient was discussed with Drs. Lennon Boutwell and Court Joyn andC.H. Robinson Worldwidessessment and plan formulated with their assistance.  Please see their attached note for official documentation of the daily encounter.   LOS: 0 days   Bain, SchuLondon PepperStudent 08/16/2020, 9:44 AM  Attestation for Student Documentation:  I personally was present and performed or re-performed the history, physical exam and medical decision-making activities of this service and have verified that the service and findings are accurately documented in the student's note.  Booker Bhatnagar, JamMadalyn [id number], 5:29 PM

## 2020-08-16 NOTE — Progress Notes (Signed)
Patient ID: [name], male   DOB: 12/25/1950, 70 y.o.   MRN: [id number] Patient is seen in follow-up for cellulitis left above-the-knee amputation.  Examination the skin is wrinkling and the cellulitis is resolving after only 1 day of the IV antibiotics.  Patient is making better than expected progress.  Patient would like to continue his IV antibiotics for 1 week and discharged on oral antibiotics, and I will follow-up in the office in 2 weeks.

## 2020-08-16 NOTE — Progress Notes (Signed)
Pharmacy Antibiotic Note  [name] is a 70 y.o. male admitted on 08/14/2020 presenting with leg pain s/p left leg AKA stump fall. Pt reports having weakness with chills and low grade fever x 1 week.  Pharmacy has been consulted vancomycin dosing for suspected cellulitis/concern for sepsis. The patient is also on ceftriaxone IV 2g q24h.   The patient has improved on IV antibiotics thus far, therefore, will continue for 1 week then reassess.   Plan: -Vancomycin IV 1250 mg q12h (estimated AUC 490, goal 400-550) -Monitor renal function, cultures, clinical status, and length of therapy -Obtain vancomycin peaks and troughs as clinically indicated  Height: 5\' 8"  (172.7 cm) Weight: (!) 167.4 kg (369 lb) IBW/kg (Calculated) : 68.4  Temp (24hrs), Avg:99.8 F (37.7 C), Min:99 F (37.2 C), Max:100.6 F (38.1 C)  Recent Labs  Lab 08/14/20 1254 08/14/20 1937 08/15/20 0259 08/16/20 0124  WBC 5.6  --  5.1 4.7  CREATININE 1.22  --  1.19 0.99  LATICACIDVEN <0.3* 1.2  --   --       Allergies  Allergen Reactions   Morphine Hives    Antimicrobials this admission: 7/7 Vancomycin >> 7/8 Ceftriaxone >>   Microbiology results: 7/7 BCx: NGTD 7/7 UCx: NGTD 7/7 COVID: negative   Thank you for allowing pharmacy to be a part of this patient's care.  Shauna Hugh, PharmD, [place]  PGY-2 Pharmacy Resident 08/16/2020 3:24 PM  Please check [email or web address] for unit-specific pharmacy phone numbers.

## 2020-08-16 NOTE — Progress Notes (Signed)
RT note. Patient placed on cpap for the night

## 2020-08-16 NOTE — Plan of Care (Signed)
  Problem: Activity: Goal: Risk for activity intolerance will decrease Outcome: Progressing   Problem: Safety: Goal: Ability to remain free from injury will improve Outcome: Progressing   Problem: Pain Managment: Goal: General experience of comfort will improve Outcome: Progressing   Problem: Skin Integrity: Goal: Risk for impaired skin integrity will decrease Outcome: Progressing   Problem: Education: Goal: Knowledge of General Education information will improve Description: Including pain rating scale, medication(s)/side effects and non-pharmacologic comfort measures Outcome: Progressing

## 2020-08-17 DIAGNOSIS — T8740 Infection of amputation stump, unspecified extremity: Secondary | ICD-10-CM | POA: Diagnosis not present

## 2020-08-17 LAB — CBC WITH DIFFERENTIAL/PLATELET
Abs Immature Granulocytes: 0.03 10*3/uL (ref 0.00–0.07)
Basophils Absolute: 0 10*3/uL (ref 0.0–0.1)
Basophils Relative: 0 %
Eosinophils Absolute: 0.1 10*3/uL (ref 0.0–0.5)
Eosinophils Relative: 3 %
HCT: 31.9 % — ABNORMAL LOW (ref 39.0–52.0)
Hemoglobin: 10.2 g/dL — ABNORMAL LOW (ref 13.0–17.0)
Immature Granulocytes: 1 %
Lymphocytes Relative: 17 %
Lymphs Abs: 0.7 10*3/uL (ref 0.7–4.0)
MCH: 31.2 pg (ref 26.0–34.0)
MCHC: 32 g/dL (ref 30.0–36.0)
MCV: 97.6 fL (ref 80.0–100.0)
Monocytes Absolute: 0.4 10*3/uL (ref 0.1–1.0)
Monocytes Relative: 10 %
Neutro Abs: 2.9 10*3/uL (ref 1.7–7.7)
Neutrophils Relative %: 69 %
Platelets: 161 10*3/uL (ref 150–400)
RBC: 3.27 MIL/uL — ABNORMAL LOW (ref 4.22–5.81)
RDW: 14.7 % (ref 11.5–15.5)
WBC: 4.1 10*3/uL (ref 4.0–10.5)
nRBC: 0 % (ref 0.0–0.2)

## 2020-08-17 LAB — COMPREHENSIVE METABOLIC PANEL
ALT: 18 U/L (ref 0–44)
AST: 18 U/L (ref 15–41)
Albumin: 2.3 g/dL — ABNORMAL LOW (ref 3.5–5.0)
Alkaline Phosphatase: 64 U/L (ref 38–126)
Anion gap: 11 (ref 5–15)
BUN: 11 mg/dL (ref 8–23)
CO2: 24 mmol/L (ref 22–32)
Calcium: 9.1 mg/dL (ref 8.9–10.3)
Chloride: 104 mmol/L (ref 98–111)
Creatinine, Ser: 0.92 mg/dL (ref 0.61–1.24)
GFR, Estimated: >60 mL/min (ref >60)
Glucose, Bld: 107 mg/dL — ABNORMAL HIGH (ref 70–99)
Potassium: 3.9 mmol/L (ref 3.5–5.1)
Sodium: 139 mmol/L (ref 135–145)
Total Bilirubin: 0.7 mg/dL (ref 0.3–1.2)
Total Protein: 5.9 g/dL — ABNORMAL LOW (ref 6.5–8.1)

## 2020-08-17 LAB — SEDIMENTATION RATE: Sed Rate: 93 mm/hr — ABNORMAL HIGH (ref 0–16)

## 2020-08-17 LAB — C-REACTIVE PROTEIN: CRP: 27.2 mg/dL — ABNORMAL HIGH (ref <1.0)

## 2020-08-17 LAB — VANCOMYCIN, PEAK: Vancomycin Pk: 40 ug/mL (ref 30–40)

## 2020-08-17 MED ORDER — BISACODYL 5 MG PO TBEC: 5.0000 mg | DELAYED_RELEASE_TABLET | Freq: Every day | ORAL | Status: DC | PRN

## 2020-08-17 MED ORDER — IPRATROPIUM-ALBUTEROL 0.5-2.5 (3) MG/3ML IN SOLN: 3.0000 mL | RESPIRATORY_TRACT | Status: DC | PRN

## 2020-08-17 MED ORDER — SENNOSIDES-DOCUSATE SODIUM 8.6-50 MG PO TABS: 1.0000 | ORAL_TABLET | Freq: Every evening | ORAL | Status: DC | PRN

## 2020-08-17 MED ORDER — IPRATROPIUM-ALBUTEROL 0.5-2.5 (3) MG/3ML IN SOLN
3.0000 mL | RESPIRATORY_TRACT | Status: DC | PRN
  Administered 2020-08-17: 3 mL via RESPIRATORY_TRACT
  Filled 2020-08-17: qty 3

## 2020-08-17 NOTE — Evaluation (Signed)
Physical Therapy Evaluation Patient Details Name: [name] MRN: [id number] DOB: 11/04/1950 Today's Date: 08/17/2020   History of Present Illness  70 yo male [facility] with falls x2 one on L residual limb, + cellulitis on antibiotics. PMHx significant for Lt TKR with I&D 11/06/19, L TKA in 6433 complicated by prosthetic joint infection resuliting in multiple subsequent sx now s/p L AKA 11/2019, A-fib, hypothyroidism, GERD, and CHF.  Clinical Impression   Pt presents with debility, LLE residual limb pain and redness, moderate difficulty performing bed mobility tasks, fair sitting balance, and decreased activity tolerance vs baseline. Pt to benefit from acute PT to address deficits. Pt required mod assist for moving to/from EOB, tolerated EOB sitting x10 minutes performing functional activities of bed pad change, urination in urinal. At baseline, pt is independent with transferring to/from his w/c. PT recommending ST-SNF prior to return to ILF, pt feels he will be able to go home but PT does not recommend this based on assist levels on eval. PT to progress mobility as tolerated, and will continue to follow acutely.      Follow Up Recommendations SNF;Supervision for mobility/OOB    Equipment Recommendations  None recommended by PT    Recommendations for Other Services       Precautions / Restrictions Precautions Precautions: Fall Precaution Comments: L AKA Restrictions Weight Bearing Restrictions: No      Mobility  Bed Mobility Overal bed mobility: Needs Assistance Bed Mobility: Supine to Sit;Sit to Supine     Supine to sit: Mod assist;HOB elevated Sit to supine: Mod assist;HOB elevated   General bed mobility comments: Mod assist for trunk and LE management, boost assist up in bed with use of trendelenburg function and bed pads.    Transfers                 General transfer comment: NT - EOB sitting x10 minutes  Ambulation/Gait                 Stairs            Wheelchair Mobility    Modified Rankin (Stroke Patients Only)       Balance Overall balance assessment: Needs assistance;History of Falls Sitting-balance support: Single extremity supported;Feet supported Sitting balance-Leahy Scale: Fair Sitting balance - Comments: able to laterally weight shift with UE support       Standing balance comment: nt                             Pertinent Vitals/Pain Pain Assessment: Faces Faces Pain Scale: Hurts a little bit Pain Location: L residual limb Pain Descriptors / Indicators: Aching Pain Intervention(s): Limited activity within patient's tolerance;Monitored during session;Repositioned    Home Living Family/patient expects to be discharged to:: Private residence Living Arrangements: Alone Available Help at Discharge: Friend(s);Available PRN/intermittently Type of Home: Independent living facility ([facility]) Home Access: Level entry     Home Layout: One level Home Equipment: [place] - 2 wheels;Walker - 4 wheels;Cane - single point;Wheelchair - manual;Grab bars - toilet      Prior Function Level of Independence: Needs assistance   Gait / Transfers Assistance Needed: pt reports being transfer-level with w/c, propels w/c himself  ADL's / Homemaking Assistance Needed: [facility] handles medication management, house cleaning, goes to dining hall for meals.        Hand Dominance   Dominant Hand: Right    Extremity/Trunk Assessment  Upper Extremity Assessment Upper Extremity Assessment: Defer to OT evaluation    Lower Extremity Assessment Lower Extremity Assessment: Generalized weakness;LLE deficits/detail LLE Deficits / Details: L AKA; TTP distal end of residual limb, redness noted LLE: Unable to fully assess due to pain    Cervical / Trunk Assessment Cervical / Trunk Assessment: Other exceptions Cervical / Trunk Exceptions: abdominal obesity  Communication    Communication: No difficulties  Cognition Arousal/Alertness: Awake/alert Behavior During Therapy: WFL for tasks assessed/performed Overall Cognitive Status: Within Functional Limits for tasks assessed                                 General Comments: Lacks some insight into deficits      General Comments General comments (skin integrity, edema, etc.): Bed pads wet, PT assisted pt in changing pads. Pt with sheet line indentations in back, sat EOB x10 minutes for pressure relief. SpO287-90% on RA, DOE 2/4    Exercises     Assessment/Plan    PT Assessment Patient needs continued PT services  PT Problem List Decreased strength;Decreased mobility;Decreased activity tolerance;Decreased balance;Decreased knowledge of use of DME;Pain;Decreased safety awareness       PT Treatment Interventions DME instruction;Therapeutic activities;Gait training;Therapeutic exercise;Patient/family education;Balance training;Stair training;Functional mobility training;Neuromuscular re-education    PT Goals (Current goals can be found in the Care Plan section)  Acute Rehab PT Goals Patient Stated Goal: return to ILF when ready PT Goal Formulation: With patient Time For Goal Achievement: 08/31/20 Potential to Achieve Goals: Good    Frequency Min 3X/week   Barriers to discharge        Co-evaluation               AM-PAC PT "6 Clicks" Mobility  Outcome Measure Help needed turning from your back to your side while in a flat bed without using bedrails?: A Little Help needed moving from lying on your back to sitting on the side of a flat bed without using bedrails?: A Lot Help needed moving to and from a bed to a chair (including a wheelchair)?: A Lot Help needed standing up from a chair using your arms (e.g., wheelchair or bedside chair)?: A Lot Help needed to walk in hospital room?: Total Help needed climbing 3-5 steps with a railing? : Total 6 Click Score: 11    End of Session    Activity Tolerance: Patient tolerated treatment well;Patient limited by fatigue Patient left: in bed;with call bell/phone within reach;with bed alarm set Nurse Communication: Mobility status PT Visit Diagnosis: Other abnormalities of gait and mobility (R26.89);History of falling (Z91.81);Pain Pain - Right/Left: Left Pain - part of body: Leg    Time: 1131-1157 (x4 minutes removed from total time, pt received breathing treatment mid-eval) PT Time Calculation (min) (ACUTE ONLY): 26 min   Charges:   PT Evaluation $PT Eval Low Complexity: 1 Low PT Treatments $Therapeutic Activity: 8-22 mins       Stacie Glaze, PT DPT Acute Rehabilitation Services Pager [phone]  Office [phone]   Roxine Caddy E Ruffin Pyo 08/17/2020, 12:19 PM

## 2020-08-17 NOTE — Progress Notes (Signed)
RT note. Patient refusing cpap tonight. CPAP is in patients room if he decides to change his mind. RT will continue to monitor, pt. Placed on 2L [place] sat 91%

## 2020-08-17 NOTE — Progress Notes (Signed)
    Subjective:  Overnight Events: Placed on CPAP overnight  Patient would like to work with PT/OT today and get up into chair. Patient reports having 2 bowel movements overnight.  Reports a little bit of cough this morning, but says this is chronic and not an acute change.   Objective:  Vital signs in last 24 hours: Vitals:   08/16/20 2005 08/16/20 2011 08/17/20 0014 08/17/20 0422  BP: 115/64   128/66  Pulse: 84   (!) 101  Resp: 17   15  Temp: 100 F (37.8 C)   98.9 F (37.2 C)  TempSrc: Oral   Oral  SpO2: 93% 92% 92% 94%  Weight:      Height:       Supplemental O2: Room Air SpO2: 94 % O2 Flow Rate (L/min): 12 L/min FiO2 (%): 92 %   Physical Exam:   General: Well appearing, morbidly obese Cardiovascular: Irregularly irregular pulse, no murmurs rubs or gallops Pulmonary : Breathing comfortably lying flat in bed, no adventitious breath sounds Musculoskeletal: Left AKA Skin: Warm and erythematous left stump, improving since yesterdays assessment  Assessment/Plan:   Principal Problem:   Infection of above knee amputation stump [facility]) Active Problems:   Cellulitis   Patient Summary: Hospital day 3 for [name] a 70 y.o. person living with CHF, A. 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  #Left stump cellulitis Unable to obtain MRI on this admission due to body habitus, but CT femur left showing 2 collections of fluid, possibly representing abscess.  Had periosteal reaction on x-ray but no definite signs of acute osteomyelitis by CT.  Improving, but still has a large area of cellulitis and will continue IV antibiotics.   - Continue vancomycin and ceftriaxone  #HFpEF Last echo from 2014 showed EF to 50 to 55%. - Continue 20 mg Lasix daily - Strict ins and outs - Daily weights  #Atrial  fibrillation Hx of atrial fibrillation, rate controlled and on Xarelto.  Also has a history of recurrent VTE.  - Continue diltiazem 240 mg daily - Continue metoprolol tartrate 50 mg twice daily  #Hypothyroidism -Continue home levothyroxine 125 MCG   Diet: Heart Healthy IVF: None,None VTE: NOAC Code: DNR PT/OT recs: SNF  Dispo: Anticipated discharge to Skilled nursing facility in 4 days pending improvement in cellulitis.   Tamsen Snider, MD PGY3 Internal Medicine Pager: [phone] Please contact the on call pager after 5 pm and on weekends at [phone].

## 2020-08-17 NOTE — Progress Notes (Signed)
RT note. Patient placed on cpap

## 2020-08-17 NOTE — Plan of Care (Signed)

## 2020-08-18 DIAGNOSIS — T8740 Infection of amputation stump, unspecified extremity: Secondary | ICD-10-CM | POA: Diagnosis not present

## 2020-08-18 DIAGNOSIS — L03116 Cellulitis of left lower limb: Secondary | ICD-10-CM | POA: Diagnosis not present

## 2020-08-18 LAB — CBC WITH DIFFERENTIAL/PLATELET
Abs Immature Granulocytes: 0 10*3/uL (ref 0.00–0.07)
Basophils Absolute: 0.1 10*3/uL (ref 0.0–0.1)
Basophils Relative: 2 %
Eosinophils Absolute: 0.2 10*3/uL (ref 0.0–0.5)
Eosinophils Relative: 6 %
HCT: 33.5 % — ABNORMAL LOW (ref 39.0–52.0)
Hemoglobin: 10.5 g/dL — ABNORMAL LOW (ref 13.0–17.0)
Lymphocytes Relative: 12 %
Lymphs Abs: 0.4 10*3/uL — ABNORMAL LOW (ref 0.7–4.0)
MCH: 31.1 pg (ref 26.0–34.0)
MCHC: 31.3 g/dL (ref 30.0–36.0)
MCV: 99.1 fL (ref 80.0–100.0)
Monocytes Absolute: 0.2 10*3/uL (ref 0.1–1.0)
Monocytes Relative: 6 %
Neutro Abs: 2.5 10*3/uL (ref 1.7–7.7)
Neutrophils Relative %: 74 %
Platelets: 211 10*3/uL (ref 150–400)
RBC: 3.38 MIL/uL — ABNORMAL LOW (ref 4.22–5.81)
RDW: 15 % (ref 11.5–15.5)
WBC: 3.4 10*3/uL — ABNORMAL LOW (ref 4.0–10.5)
nRBC: 0 % (ref 0.0–0.2)
nRBC: 2 /100 WBC — ABNORMAL HIGH

## 2020-08-18 LAB — COMPREHENSIVE METABOLIC PANEL
ALT: 22 U/L (ref 0–44)
AST: 22 U/L (ref 15–41)
Albumin: 2.3 g/dL — ABNORMAL LOW (ref 3.5–5.0)
Alkaline Phosphatase: 64 U/L (ref 38–126)
Anion gap: 8 (ref 5–15)
BUN: 10 mg/dL (ref 8–23)
CO2: 24 mmol/L (ref 22–32)
Calcium: 8.9 mg/dL (ref 8.9–10.3)
Chloride: 105 mmol/L (ref 98–111)
Creatinine, Ser: 0.9 mg/dL (ref 0.61–1.24)
GFR, Estimated: >60 mL/min (ref >60)
Glucose, Bld: 127 mg/dL — ABNORMAL HIGH (ref 70–99)
Potassium: 3.6 mmol/L (ref 3.5–5.1)
Sodium: 137 mmol/L (ref 135–145)
Total Bilirubin: 0.9 mg/dL (ref 0.3–1.2)
Total Protein: 6.4 g/dL — ABNORMAL LOW (ref 6.5–8.1)

## 2020-08-18 LAB — VANCOMYCIN, PEAK: Vancomycin Pk: 32 ug/mL (ref 30–40)

## 2020-08-18 LAB — VANCOMYCIN, TROUGH: Vancomycin Tr: 17 ug/mL (ref 15–20)

## 2020-08-18 MED ORDER — POTASSIUM CHLORIDE CRYS ER 20 MEQ PO TBCR
40.0000 meq | EXTENDED_RELEASE_TABLET | Freq: Once | ORAL | Status: AC
  Administered 2020-08-18: 40 meq via ORAL
  Filled 2020-08-18: qty 2

## 2020-08-18 NOTE — Progress Notes (Signed)
Physical Therapy Treatment Patient Details Name: [name] MRN: [id number] DOB: Mar 18, 1950 Today's Date: 08/18/2020    History of Present Illness 70 yo male presents to [facility] on 7/7 with falls x2 one on L residual limb, + cellulitis on antibiotics. 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    PT Comments    Pt was seen for progression of mobility but pt declines to get OOB.  He is able to do there ex with RLE and minimal work on LLE, mainly working on hip abd/add.  Follow along with him to increase time standing on RLE and work on bed to chair transfers as tolerated.  Focus on BLE strength, standing balance and endurance standing.  Follow Up Recommendations  SNF;Supervision for mobility/OOB     Equipment Recommendations  None recommended by PT    Recommendations for Other Services       Precautions / Restrictions Precautions Precautions: Fall Precaution Comments: L AKA Restrictions Weight Bearing Restrictions: No    Mobility  Bed Mobility Overal bed mobility: Needs Assistance             General bed mobility comments: scoting laterally on bed withmin assist    Transfers                 General transfer comment: declined  Ambulation/Gait                 Stairs             Wheelchair Mobility    Modified Rankin (Stroke Patients Only)       Balance Overall balance assessment: Needs assistance                                          Cognition Arousal/Alertness: Awake/alert Behavior During Therapy: WFL for tasks assessed/performed Overall Cognitive Status: Within Functional Limits for tasks assessed                                        Exercises General Exercises - Lower Extremity Ankle Circles/Pumps: AROM;AAROM;5 reps Quad Sets:  AROM;10 reps Gluteal Sets: AROM;10 reps Heel Slides: AAROM;10 reps Hip ABduction/ADduction: AROM;AAROM;10 reps    General Comments        Pertinent Vitals/Pain Pain Assessment: Faces Faces Pain Scale: Hurts a little bit Pain Location: L residual limb Pain Descriptors / Indicators: Guarding Pain Intervention(s): Monitored during session;Repositioned    Home Living                      Prior Function            PT Goals (current goals can now be found in the care plan section) Acute Rehab PT Goals Patient Stated Goal: get home Progress towards PT goals: Not progressing toward goals - comment    Frequency    Min 3X/week      PT Plan Current plan remains appropriate    Co-evaluation              AM-PAC PT "6 Clicks" Mobility   Outcome Measure  Help needed turning from your back to your side while in a flat  bed without using bedrails?: A Little Help needed moving from lying on your back to sitting on the side of a flat bed without using bedrails?: A Lot Help needed moving to and from a bed to a chair (including a wheelchair)?: A Lot Help needed standing up from a chair using your arms (e.g., wheelchair or bedside chair)?: A Lot Help needed to walk in hospital room?: Total Help needed climbing 3-5 steps with a railing? : Total 6 Click Score: 11    End of Session   Activity Tolerance: Patient tolerated treatment well;Patient limited by fatigue Patient left: in bed;with call bell/phone within reach;with bed alarm set Nurse Communication: Mobility status PT Visit Diagnosis: Other abnormalities of gait and mobility (R26.89);History of falling (Z91.81);Pain Pain - Right/Left: Left Pain - part of body: Leg     Time: 2637-8588 PT Time Calculation (min) (ACUTE ONLY): 18 min  Charges:  $Therapeutic Exercise: 8-22 mins $Therapeutic Activity: 8-22 mins                    Ramond Dial 08/18/2020, 7:33 PM  Mee Hives, PT MS Acute Rehab Dept. Number:  [place] and [place]

## 2020-08-18 NOTE — Evaluation (Signed)
Occupational Therapy Evaluation Patient Details Name: [name] MRN: [id number] DOB: October 13, 1950 Today's Date: 08/18/2020    History of Present Illness 70 yo male presents to [facility] on 7/7 with falls x2 one on L residual limb, + cellulitis on antibiotics. 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   Clinical Impression   Pt presents with decline in function and safety with ADLs and ADL mobility with impaired strength, balance and endurance. PTA pt lived at an [employer]) and reports that he was Ind with ADLs (sink baths and some assist with socks) and used a w/c for mobility (SPTs). Pt currently requires min guard A with UB ADLs, max - total A with LB ADLs, total A with toileting and pt refused attempting sit - stand or SPTs, Educated pt on the importance/benefits of OOB activity, however pt continues to refuse and stated "let's just try tomorrow". Pt would benefit from acute OT services to address impairments to maximize level of function and safety    Follow Up Recommendations  SNF    Equipment Recommendations  Other (comment) (TBD at next venue of care)    Recommendations for Other Services       Precautions / Restrictions Precautions Precautions: Fall Precaution Comments: L AKA Restrictions Weight Bearing Restrictions: No      Mobility Bed Mobility Overal bed mobility: Needs Assistance Bed Mobility: Supine to Sit;Sit to Supine     Supine to sit: Min assist;HOB elevated Sit to supine: Min assist   General bed mobility comments: min A with LE mgt, used bed controls to scoot to [facility] using rails    Transfers                 General transfer comment: pt refused attempting sit - stand or SPTs, Educated pt on the importance/benefits of OOB activity, however pt continues to refuse and stated "let's just  try tomorrow"    Balance Overall balance assessment: Needs assistance;History of Falls Sitting-balance support: Single extremity supported;Feet supported Sitting balance-Leahy Scale: Fair                                     ADL either performed or assessed with clinical judgement   ADL Overall ADL's : Needs assistance/impaired Eating/Feeding: Independent;Sitting   Grooming: Wash/dry hands;Wash/dry face;Min guard;Sitting   Upper Body Bathing: Min guard;Sitting   Lower Body Bathing: Maximal assistance   Upper Body Dressing : Min guard;Sitting   Lower Body Dressing: Total assistance     Toilet Transfer Details (indicate cue type and reason): pt declined attempting Toileting- Clothing Manipulation and Hygiene: Total assistance;Bed level         General ADL Comments: pt educated on LB ADL techniques while seated     Vision Baseline Vision/History: Wears glasses Wears Glasses: Reading only Patient Visual Report: No change from baseline       Perception     Praxis      Pertinent Vitals/Pain Pain Assessment: Faces Faces Pain Scale: Hurts a little bit Pain Location: L residual limb Pain Descriptors / Indicators: Aching;Discomfort Pain Intervention(s): Monitored during session;Repositioned     Hand Dominance Right   Extremity/Trunk Assessment Upper Extremity Assessment Upper Extremity Assessment: Generalized weakness   Lower Extremity Assessment Lower Extremity Assessment: Defer to PT evaluation  Cervical / Trunk Assessment Cervical / Trunk Assessment: Other exceptions Cervical / Trunk Exceptions: abdominal obesity   Communication Communication Communication: No difficulties   Cognition Arousal/Alertness: Awake/alert Behavior During Therapy: WFL for tasks assessed/performed Overall Cognitive Status: Within Functional Limits for tasks assessed                                 General Comments: Lacks some insight into  deficits   General Comments       Exercises     Shoulder Instructions      Home Living Family/patient expects to be discharged to:: Private residence Living Arrangements: Alone Available Help at Discharge: Friend(s);Available PRN/intermittently Type of Home: Independent living facility Home Access: Level entry     Home Layout: One level     Bathroom Shower/Tub: Occupational psychologist: Handicapped height     Home Equipment: Environmental consultant - 2 wheels;Walker - 4 wheels;Cane - single point;Wheelchair - manual;Grab bars - toilet   Additional Comments: pt reports  he "bird baths" at sink and uses a BSC      Prior Functioning/Environment Level of Independence: Needs assistance  Gait / Transfers Assistance Needed: pt reports being transfer-level with w/c, propels w/c himself ADL's / Homemaking Assistance Needed: [facility] handles medication management, house cleaning, goes to dining hall for meals. Some assist with donning socks   Comments: pt uses w/c        OT Problem List: Decreased strength;Impaired balance (sitting and/or standing);Pain;Decreased coordination;Decreased activity tolerance;Decreased knowledge of use of DME or AE      OT Treatment/Interventions: Self-care/ADL training;Patient/family education;Therapeutic exercise;Balance training;Therapeutic activities;DME and/or AE instruction    OT Goals(Current goals can be found in the care plan section) Acute Rehab OT Goals Patient Stated Goal: return to ILF when ready OT Goal Formulation: With patient Time For Goal Achievement: 09/01/20 Potential to Achieve Goals: Good ADL Goals Pt Will Perform Grooming: with supervision;with set-up;sitting Pt Will Perform Upper Body Bathing: with supervision;with set-up;sitting Pt Will Perform Lower Body Bathing: with mod assist;with min assist;sitting/lateral leans Pt Will Perform Upper Body Dressing: with supervision;with set-up;sitting Pt Will Transfer to Toilet: with  max assist;with mod assist;stand pivot transfer;squat pivot transfer;with transfer board;bedside commode  OT Frequency: Min 2X/week   Barriers to D/C:            Co-evaluation              AM-PAC OT "6 Clicks" Daily Activity     Outcome Measure Help from another person eating meals?: None Help from another person taking care of personal grooming?: A Little Help from another person toileting, which includes using toliet, bedpan, or urinal?: Total Help from another person bathing (including washing, rinsing, drying)?: A Lot Help from another person to put on and taking off regular upper body clothing?: A Little Help from another person to put on and taking off regular lower body clothing?: Total 6 Click Score: 14   End of Session Nurse Communication: Mobility status  Activity Tolerance: Patient limited by fatigue Patient left: in bed;with call bell/phone within reach  OT Visit Diagnosis: Other abnormalities of gait and mobility (R26.89);Muscle weakness (generalized) (M62.81);Pain Pain - Right/Left: Left Pain - part of body: Leg                Time: 5681-2751 OT Time Calculation (min): 26 min Charges:  OT General Charges $OT Visit: 1 Visit OT Evaluation $OT Eval Low Complexity:  1 Low OT Treatments $Self Care/Home Management : 8-22 mins    Emmit Alexanders [facility] 08/18/2020, 2:03 PM

## 2020-08-18 NOTE — Progress Notes (Signed)
RT placed patient on CPAP.  Patient tolerating CPAP settings well at this time.  RT will monitor as needed.

## 2020-08-18 NOTE — Plan of Care (Signed)

## 2020-08-18 NOTE — Progress Notes (Addendum)
   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  Pt notes this AM that the overnight nurse noted new swelling at site of cellulitis. Pt denies any pain, and is not sure if wound is draining. He also notes that his breathing been ok, but didn't wear his CPAP last night because it is too noisy.  Had a large bowel movement. He gets hot easily. Wants to know what the swelling is.   Objective:  Vital signs in last 24 hours: Vitals:   08/17/20 2053 08/17/20 2240 08/18/20 0500 08/18/20 0700  BP: 125/73   113/68  Pulse: 61   100  Resp: 18   19  Temp: 98.6 F (37 C)   98.3 F (36.8 C)  TempSrc: Oral   Oral  SpO2: 95% 91%    Weight:   (!) 184.8 kg   Height:       Weight change:   Intake/Output Summary (Last 24 hours) at 08/18/2020 1412 Last data filed at 08/18/2020 1327 Gross per 24 hour  Intake 720 ml  Output 1370 ml  Net -650 ml   General: Pleasant, well-appearing middle-age man laying comfortably in bed. No acute distress. Head: Normocephalic. Atraumatic. CV: Irregularly irregular rhythm. No murmurs, rubs, or gallops. No LE edema appreciated on R, wearing compression stocking. Pulmonary: Lungs CTAB. Normal effort. No wheezing or rales. Abdominal: Soft, nontender, nondistended. Normal bowel sounds. Extremities: Palpable pulses. Normal ROM. Skin: Erythematous borders at left amputation site continuing to improve but now with new open wound draining serosanguinous fluid on the distal, lateral aspect with local induration  Neuro: A&Ox3. Moves all extremities. Normal sensation. No focal deficit. 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 Pt's erythematous borders have been regressing daily while on Vancomycin and Ceftriaxone, but now with overnight open wound development leaking serosanguinous fluid with local induration. Have consulted Ortho for additional evaluation. -Continue Vancomycin and Ceftriaxone  CHF Last ECHO from 2014. Benign physical exam this AM without crackles nor RLE edema. K decreased to 3.6 from 4.8 on admission. Creatinine stable at 0.90 -Continue 20mg  Lasix today -Ordered 38meq K today -Continue 50mg  Metoprolol tartrate BID   Afib Hx of afib, rate controlled and on chronic anticoagulation. Also w/ hx of recurrent VTE. -Continue 240mg  Diltiazem -Continue 20mg  Xarelto daily  Hypothyroidism -Continue home 173mcg Levothyroxine    LOS: 2 days   London Pepper, Medical Student 08/18/2020, 2:12 PM

## 2020-08-18 NOTE — Progress Notes (Signed)
Pharmacy Antibiotic Note  [name] is a 70 y.o. male admitted on 08/14/2020 presenting with leg pain s/p left leg AKA stump fall. Pt reports having weakness with chills and low grade fever x 1 week.  Pharmacy has been consulted vancomycin dosing for suspected cellulitis/concern for sepsis. The patient is also on ceftriaxone IV 2g q24h.    Patient developed open would with sersanguinous fluid overnight. Otherwise improving. Vanc levels 7/11 with Auc 417 at goal. Renal function stable.   Plan: Continue Vancomycin IV 1250 mg q12h (AUC 417, goal 400-550) Monitor renal function, cultures, clinical status, and length of therapy Obtain vancomycin peaks and troughs as clinically indicated  Height: 5\' 8"  (172.7 cm) Weight: (!) 184.8 kg (407 lb 6.6 oz) IBW/kg (Calculated) : 68.4  Temp (24hrs), Avg:98.6 F (37 C), Min:98.3 F (36.8 C), Max:98.8 F (37.1 C)  Recent Labs  Lab 08/14/20 1254 08/14/20 1937 08/15/20 0259 08/16/20 0124 08/17/20 0401 08/17/20 1901 08/18/20 0839 08/18/20 1805  WBC 5.6  --  5.1 4.7 4.1  --  3.4*  --   CREATININE 1.22  --  1.19 0.99 0.92  --  0.90  --   LATICACIDVEN <0.3* 1.2  --   --   --   --   --   --   VANCOTROUGH  --   --   --   --   --   --   --  17  VANCOPEAK  --   --   --   --   --  40 32  --       Allergies  Allergen Reactions   Morphine Hives    Antimicrobials this admission: 7/7 Vancomycin >> 7/8 Ceftriaxone >>   Vancomycin levels 7/11 VP 32/ VT 17; AUC 417  > clinically ,improving, no change   Microbiology results: 7/7 BCx: NGTD 7/7 UCx: NGTD 7/7 COVID: negative   Thank you for allowing pharmacy to be a part of this patient's care.   Benetta Spar, PharmD, BCPS, BCCP Clinical Pharmacist  Please check AMION for all [place] phone numbers After 10:00 PM, call [place] [phone]

## 2020-08-18 NOTE — Progress Notes (Signed)
RN noticed a blister on pt's left stump site upon assessment. Pt denies any pain on the stump. Upon turning the pt.,blister popped with thick reddish brown fluid. Site was cleaned with saline and covered with foam dressing.   Pt also having loose stools. Per pt, he had gone 4x today on the bedpan.Pt requested for anti-diarrheal medication. MD notified. No further orders noted. Laxatives/stool softeners have been discontinued yesterday.

## 2020-08-19 DIAGNOSIS — T8740 Infection of amputation stump, unspecified extremity: Secondary | ICD-10-CM | POA: Diagnosis not present

## 2020-08-19 LAB — CBC WITH DIFFERENTIAL/PLATELET
Abs Immature Granulocytes: 0.02 10*3/uL (ref 0.00–0.07)
Basophils Absolute: 0 10*3/uL (ref 0.0–0.1)
Basophils Relative: 1 %
Eosinophils Absolute: 0.2 10*3/uL (ref 0.0–0.5)
Eosinophils Relative: 4 %
HCT: 33.6 % — ABNORMAL LOW (ref 39.0–52.0)
Hemoglobin: 10.3 g/dL — ABNORMAL LOW (ref 13.0–17.0)
Immature Granulocytes: 1 %
Lymphocytes Relative: 19 %
Lymphs Abs: 0.7 10*3/uL (ref 0.7–4.0)
MCH: 30.6 pg (ref 26.0–34.0)
MCHC: 30.7 g/dL (ref 30.0–36.0)
MCV: 99.7 fL (ref 80.0–100.0)
Monocytes Absolute: 0.4 10*3/uL (ref 0.1–1.0)
Monocytes Relative: 10 %
Neutro Abs: 2.5 10*3/uL (ref 1.7–7.7)
Neutrophils Relative %: 65 %
Platelets: 242 10*3/uL (ref 150–400)
RBC: 3.37 MIL/uL — ABNORMAL LOW (ref 4.22–5.81)
RDW: 15.1 % (ref 11.5–15.5)
WBC: 3.8 10*3/uL — ABNORMAL LOW (ref 4.0–10.5)
nRBC: 0 % (ref 0.0–0.2)

## 2020-08-19 LAB — CULTURE, BLOOD (ROUTINE X 2)
Culture: NO GROWTH
Culture: NO GROWTH

## 2020-08-19 LAB — COMPREHENSIVE METABOLIC PANEL
ALT: 22 U/L (ref 0–44)
AST: 22 U/L (ref 15–41)
Albumin: 2.3 g/dL — ABNORMAL LOW (ref 3.5–5.0)
Alkaline Phosphatase: 64 U/L (ref 38–126)
Anion gap: 8 (ref 5–15)
BUN: 12 mg/dL (ref 8–23)
CO2: 25 mmol/L (ref 22–32)
Calcium: 9.3 mg/dL (ref 8.9–10.3)
Chloride: 107 mmol/L (ref 98–111)
Creatinine, Ser: 1.03 mg/dL (ref 0.61–1.24)
GFR, Estimated: >60 mL/min (ref >60)
Glucose, Bld: 99 mg/dL (ref 70–99)
Potassium: 4.4 mmol/L (ref 3.5–5.1)
Sodium: 140 mmol/L (ref 135–145)
Total Bilirubin: 0.6 mg/dL (ref 0.3–1.2)
Total Protein: 6.6 g/dL (ref 6.5–8.1)

## 2020-08-19 LAB — C-REACTIVE PROTEIN: CRP: 19.8 mg/dL — ABNORMAL HIGH (ref <1.0)

## 2020-08-19 LAB — SEDIMENTATION RATE: Sed Rate: 128 mm/hr — ABNORMAL HIGH (ref 0–16)

## 2020-08-19 NOTE — Progress Notes (Signed)
RT placed patient on CPAP. Patient tolerating CPAP settings well. RT will monitor as needed.

## 2020-08-19 NOTE — TOC Progression Note (Signed)
Transition of Care [facility]) - Progression Note    Patient Details  Name: [name] MRN: [id number] Date of Birth: 12/24/1950  [facility] of [facility]) CM/SW Contact  Loletha Grayer Beverely Pace, RN Phone Number: 08/19/2020, 2:12 PM  Clinical Narrative:   [facility] manager spoke with patient concerning discharge plan. He has stated that he is not interested in going to a SNF for shortterm rehab, wants to return to his plaace at [place] and have [place] services. CM contacted [place] to confirm which [place] is used. Patient is agreeable that CM contact [facility] with referral for HHPT/OT. TOC team will continue to follow.      Expected Discharge Plan: Assisted Living Barriers to Discharge: Continued Medical Work up  Expected Discharge Plan and Services Expected Discharge Plan: Assisted Living In-house Referral:  (has wheelchair and RW) Discharge Planning Services: CM Consult Post Acute Care Choice: [place] arrangements for the past 2 months: Assisted Living Facility                 DME Arranged: N/A DME Agency: NA       HH Arranged: PT, OT HH Agency: [facility] Date [name]: 08/19/20 Time [place]: 1407 Representative spoke with at [place]: [place] ([place]) Interventions    Readmission Risk Interventions Readmission Risk Prevention Plan 11/09/2019  Transportation Screening Complete  PCP or Specialist Appt within 5-7 Days Complete  Home Care Screening Complete  Medication Review (RN CM) Complete  Some recent data might be hidden

## 2020-08-19 NOTE — TOC Progression Note (Signed)
Transition of Care [facility]) - Progression Note    Patient Details  Name: [name] MRN: [id number] Date of Birth: 1950/04/24  Transition of Care [facility]) CM/SW Contact  Milinda Antis, [place] Phone Number: 08/19/2020, 1:20 PM  Clinical Narrative:    13:18- CSW spoke with patient in reference to PT/ OT recommendation for SNF.  The patient would like to go back to [facility] ([place]) and receive HHPT there.    13:23-  CSW contacted [facility] to inquire about whether a new referral was needed for the patient to return there was no response.  13:30-  CSW notified CM about the patient's request for home health.     Expected Discharge Plan: Assisted Living Barriers to Discharge: Continued Medical Work up  Expected Discharge Plan and Services Expected Discharge Plan: Assisted Living       Living arrangements for the past 2 months: Assisted Living Facility                                       Social Determinants of Health (SDOH) Interventions    Readmission Risk Interventions Readmission Risk Prevention Plan 11/09/2019  Transportation Screening Complete  PCP or Specialist Appt within 5-7 Days Complete  Home Care Screening Complete  Medication Review (RN CM) Complete  Some recent data might be hidden

## 2020-08-19 NOTE — Progress Notes (Addendum)
   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   Pt notes this AM that the open wound, which developed yesterday, is still not painful. He is not aware of it changing. Ortho was re consulted and came by this AM to see the pt.  He is otherwise feeling comfortable and has no new concerns since yesterday. He did use his CPAP last night.  Objective:  Vital signs in last 24 hours: Vitals:   08/18/20 2347 08/19/20 0300 08/19/20 0500 08/19/20 0757  BP:    113/71  Pulse: 85   72  Resp: 16   17  Temp:  (P) 98 F (36.7 C)  97.7 F (36.5 C)  TempSrc:  (P) Oral  Oral  SpO2: 92%   95%  Weight:   (!) 183 kg   Height:       Weight change: -1.8 kg  Intake/Output Summary (Last 24 hours) at 08/19/2020 1306 Last data filed at 08/19/2020 1154 Gross per 24 hour  Intake 240 ml  Output 1750 ml  Net -1510 ml   General: Pleasant, well-appearing gentleman laying comfortably in bed. No acute distress. Head: Normocephalic. Atraumatic. CV: Irregularly irregular. No murmurs, rubs, or gallops. No RLE edema and wearing compression socks. Pulmonary: Lungs CTAB. Normal effort. No wheezing or rales. Abdominal: Soft, nontender, nondistended. Normal bowel sounds. Extremities: Palpable radial and DP pulses. Normal ROM. Skin: Warm and dry. Improving erythematous borders at left AKA but with two open, dressed wounds weeping serosanguinous fluid Neuro: A&Ox3. Moves all extremities. Normal sensation. No focal deficit. 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 Erythematous borders have been improving but Ortho was consulted for new open wounds which developed two nights ago. Ortho's evaluation is to continue IV abx as long as pt  remains aseptic and wound continues to drain, until primary Ortho Dr. Sharol Given returns next week to further evaluate. -Continue Vancomycin and Ceftriaxone  CHF Last ECHO from 2014. Benign physical exam this AM without crackles nor RLE edema. K improved to 4.4 after 19meq Potassium yesterday. Creatinine stable at 1.03. -Continue 20mg  Lasix -Continue 50mg  Metoprolol tartate BID  Afib Hx of Afib, rate controlled and on chronic anticoagulation. Also w/ hx of recurrent VTE. -Continue 240mg  Diltiazem -Continue 20mg  Xarelto daily  Hypothyroidism -Continue home 16mcg Levothyroxine     LOS: 3 days   London Pepper, Medical Student 08/19/2020, 1:06 PM

## 2020-08-19 NOTE — Progress Notes (Addendum)
Patient was seen this morning he is status post above-knee amputation.  Medicine noticed drainage from the amputation stump.  Asked to reconsult.  Vital signs are stable afebrile today review his most recent labs his CRP has decreased but his ESR has increased.  Examination of his stump erythema has receded from the markers a bit since being on antibiotics.  He does have 2 spots of drainage.  I would did express some purulent drainage from these areas.  This is new.  CT scan was concerning for abscesses distal to the amputation stump.   Dr. Sharol Given is currently out of town.  I will speak with one of his partners to see if they can evaluate this to see if any further treatment is needed prior to Dr. Jess Barters return  I reviewed findings and CT with Dr Erlinda Hong. He feels as long as patient is not septic and wound continues to drain patient can continue on current plan until dr Sharol Given returns next week .

## 2020-08-20 ENCOUNTER — Encounter (HOSPITAL_COMMUNITY): Payer: Self-pay | Admitting: Internal Medicine

## 2020-08-20 ENCOUNTER — Other Ambulatory Visit: Payer: Self-pay

## 2020-08-20 DIAGNOSIS — T8740 Infection of amputation stump, unspecified extremity: Secondary | ICD-10-CM | POA: Diagnosis not present

## 2020-08-20 LAB — CBC WITH DIFFERENTIAL/PLATELET
Abs Immature Granulocytes: 0.03 10*3/uL (ref 0.00–0.07)
Basophils Absolute: 0 10*3/uL (ref 0.0–0.1)
Basophils Relative: 1 %
Eosinophils Absolute: 0.1 10*3/uL (ref 0.0–0.5)
Eosinophils Relative: 4 %
HCT: 35.4 % — ABNORMAL LOW (ref 39.0–52.0)
Hemoglobin: 10.8 g/dL — ABNORMAL LOW (ref 13.0–17.0)
Immature Granulocytes: 1 %
Lymphocytes Relative: 21 %
Lymphs Abs: 0.8 10*3/uL (ref 0.7–4.0)
MCH: 30.5 pg (ref 26.0–34.0)
MCHC: 30.5 g/dL (ref 30.0–36.0)
MCV: 100 fL (ref 80.0–100.0)
Monocytes Absolute: 0.3 10*3/uL (ref 0.1–1.0)
Monocytes Relative: 9 %
Neutro Abs: 2.4 10*3/uL (ref 1.7–7.7)
Neutrophils Relative %: 64 %
Platelets: 275 10*3/uL (ref 150–400)
RBC: 3.54 MIL/uL — ABNORMAL LOW (ref 4.22–5.81)
RDW: 15.1 % (ref 11.5–15.5)
WBC: 3.7 10*3/uL — ABNORMAL LOW (ref 4.0–10.5)
nRBC: 0 % (ref 0.0–0.2)

## 2020-08-20 LAB — COMPREHENSIVE METABOLIC PANEL
ALT: 22 U/L (ref 0–44)
AST: 24 U/L (ref 15–41)
Albumin: 2.3 g/dL — ABNORMAL LOW (ref 3.5–5.0)
Alkaline Phosphatase: 60 U/L (ref 38–126)
Anion gap: 9 (ref 5–15)
BUN: 15 mg/dL (ref 8–23)
CO2: 22 mmol/L (ref 22–32)
Calcium: 9.3 mg/dL (ref 8.9–10.3)
Chloride: 109 mmol/L (ref 98–111)
Creatinine, Ser: 1.02 mg/dL (ref 0.61–1.24)
GFR, Estimated: >60 mL/min (ref >60)
Glucose, Bld: 91 mg/dL (ref 70–99)
Potassium: 4.4 mmol/L (ref 3.5–5.1)
Sodium: 140 mmol/L (ref 135–145)
Total Bilirubin: 0.6 mg/dL (ref 0.3–1.2)
Total Protein: 6.4 g/dL — ABNORMAL LOW (ref 6.5–8.1)

## 2020-08-20 MED ORDER — PNEUMOCOCCAL VAC POLYVALENT 25 MCG/0.5ML IJ INJ
0.5000 mL | INJECTION | INTRAMUSCULAR | Status: DC
  Filled 2020-08-20: qty 0.5

## 2020-08-20 NOTE — Progress Notes (Signed)
   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  Pt notes that he is continuing to feel well and has developed no new concerns overnight. Pt notes his open wound at the amputation site is continuing to drain and it continues to not be painful. Has not had any fevers.  Continuing to use his CPAP at night.   Objective:  Vital signs in last 24 hours: Vitals:   08/19/20 1928 08/19/20 2310 08/20/20 0500 08/20/20 0717  BP: 111/73   121/75  Pulse: 70 72  80  Resp: 20 18  17   Temp: 98.3 F (36.8 C)   98.7 F (37.1 C)  TempSrc: Oral   Oral  SpO2: 97% 98%  97%  Weight:   (!) 182.5 kg   Height:       Weight change: -0.5 kg No intake or output data in the 24 hours ending 08/20/20 1345  General: Pleasant, well-appearing gentleman laying comfortably in bed. No acute distress. Head: Normocephalic. Atraumatic. CV: RRR. No murmurs, rubs, or gallops. No RLE edema Pulmonary: Lungs CTAB. Normal effort. No wheezing or rales. Abdominal: Soft, nontender, nondistended. Normal bowel sounds. Extremities: Palpable radial and DP pulses. Normal ROM. L AKA. Skin: Warm and dry. Erythematous borders with continued improvement at amputation site. Open wound still draining serosanguinous fluid. Lateral fluctuance.  Neuro: A&Ox3. Moves all extremities. Normal sensation. No focal deficit. 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 Erythematous borders continuing to improve.  Open wound still draining and pt remains afebrile. Ortho re-consulted yesterday with recs to continue IV abx and f/u with [name] next week in clinic for further evaluation. -Will re-image to observe both fluid collections' progress before discharging on PO abx -Continue IV Vancomycin and Ceftriaxone  CHF Lash ECHO from 2014. Benign physical exam this AM without crackles nor RLE edema. K stable at 4.4 and Creatinine stable at 1.02. -Continue 20mg  Lasix -Continue 50mg  Metoprolol tartate BID  Afib Hx of Afib, rate controlled and on chronic anticoagulation. Also w/ hx of recurrent VTE. -Continue 240mg  Diltiazem -Continue 20mg  Xarelto  Hypothyroidism -Continue home 166mcg Levothyroxine     LOS: 4 days   [name], Medical Student 08/20/2020, 1:45 PM

## 2020-08-20 NOTE — Progress Notes (Signed)
Pt placed on cpap 

## 2020-08-20 NOTE — Progress Notes (Signed)
Physical Therapy Treatment Patient Details Name: [name] MRN: [id number] DOB: Jan 23, 1951 Today's Date: 08/20/2020    History of Present Illness 70 yo male presents to [facility] on 7/7 with falls x2 one on L residual limb, + cellulitis on antibiotics. 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    PT Comments    Pt was seen for progressing from bed to chair, and was quite light headed initially.  Talked with him about his limited time OOB, and he agreed to get up in the chair.  He is assisted with maxi-move to chair with nursing, and asked for PT to return to assist nursing with his return to bed.  Agreed, repositioning on chair and working on comfort with reclining angle of chair.  Follow up with pt on return to bed, work with nursing to educate them for pt safety.   Follow Up Recommendations  SNF;Supervision for mobility/OOB     Equipment Recommendations  None recommended by PT    Recommendations for Other Services       Precautions / Restrictions Precautions Precautions: Fall Restrictions Weight Bearing Restrictions: Yes LLE Weight Bearing: Non weight bearing    Mobility  Bed Mobility Overal bed mobility: Needs Assistance Bed Mobility: Supine to Sit     Supine to sit: Min assist Sit to supine: Min assist   General bed mobility comments: sat on side of bed with LUE supported due to feeling light headed but sitting BP was 145/78    Transfers Overall transfer level: Needs assistance Equipment used: 2 person hand held assist Transfers:  (lift)           General transfer comment: used maximove due to pt being unable to lift off RLE on side of bed  Ambulation/Gait             General Gait Details: unable   Stairs             Wheelchair Mobility    Modified Rankin (Stroke Patients Only)        Balance Overall balance assessment: Needs assistance Sitting-balance support: Single extremity supported;Bilateral upper extremity supported Sitting balance-Leahy Scale: Fair                                      Cognition Arousal/Alertness: Awake/alert Behavior During Therapy: WFL for tasks assessed/performed Overall Cognitive Status: Within Functional Limits for tasks assessed                                 General Comments: asked mult times if he could just stand up      Exercises      General Comments General comments (skin integrity, edema, etc.): Pt has L leg in loose bandage, nursing in to replace it and assist to transfer to recliner      Pertinent Vitals/Pain Pain Assessment: Faces Faces Pain Scale: Hurts even more Pain Location: L residual limb Pain Descriptors / Indicators: Grimacing;Guarding Pain Intervention(s): Limited activity within patient's tolerance;Monitored during session;Premedicated before session;Repositioned    Home Living                      Prior Function  PT Goals (current goals can now be found in the care plan section) Acute Rehab PT Goals Patient Stated Goal: get home Progress towards PT goals: Progressing toward goals    Frequency    Min 3X/week      PT Plan Current plan remains appropriate;Other (comment) (PT will return to assist nursing to get pt back to bed)    Co-evaluation              AM-PAC PT "6 Clicks" Mobility   Outcome Measure  Help needed turning from your back to your side while in a flat bed without using bedrails?: A Little Help needed moving from lying on your back to sitting on the side of a flat bed without using bedrails?: A Little Help needed moving to and from a bed to a chair (including a wheelchair)?: A Lot Help needed standing up from a chair using your arms (e.g., wheelchair or bedside chair)?: A Lot Help needed to walk in hospital room?:  Total Help needed climbing 3-5 steps with a railing? : Total 6 Click Score: 12    End of Session Equipment Utilized During Treatment: Gait belt Activity Tolerance: Patient tolerated treatment well;Other (comment) (LLE sensitive to use of sling with maximove) Patient left: with chair alarm set;in chair;with call bell/phone within reach;with nursing/sitter in room Nurse Communication: Mobility status PT Visit Diagnosis: Other abnormalities of gait and mobility (R26.89);History of falling (Z91.81);Pain Pain - Right/Left: Left Pain - part of body: Leg     Time: 1305-1350 PT Time Calculation (min) (ACUTE ONLY): 45 min  Charges:  $Therapeutic Activity: 23-37 mins $Neuromuscular Re-education: 8-22 mins                    Ramond Dial 08/20/2020, 4:43 PM  Mee Hives, PT MS Acute Rehab Dept. Number: [place] and [place]

## 2020-08-20 NOTE — Progress Notes (Signed)
Physical Therapy Treatment Patient Details Name: [name] MRN: [id number] DOB: [birth year] Today's Date: 08/20/2020    History of Present Illness 70 yo male presents to [facility] on 7/7 with falls x2 one on L residual limb, + cellulitis on antibiotics. 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    PT Comments    Pt was repositioned to get back to bed with lift, and nursing aware of safety and technique to move him.  Pt tolerated the chair well, agreed to be up again on next visit.  Rolls in bed with min assist, comfortable with repositioning after going to bed with lift.  Follow for acute PT goals.   Follow Up Recommendations  SNF;Supervision for mobility/OOB     Equipment Recommendations  None recommended by PT    Recommendations for Other Services       Precautions / Restrictions Precautions Precautions: Fall Restrictions Weight Bearing Restrictions: Yes LLE Weight Bearing: Non weight bearing    Mobility  Bed Mobility Overal bed mobility: Needs Assistance Bed Mobility: Sit to Supine     Supine to sit: Min assist Sit to supine: Min assist   General bed mobility comments: returning to bed with lift    Transfers Overall transfer level: Needs assistance Equipment used: 2 person hand held assist Transfers:  (lift)           General transfer comment: maximove due to being unable to stand on RLE  Ambulation/Gait             General Gait Details: unable   Stairs             Wheelchair Mobility    Modified Rankin (Stroke Patients Only)       Balance Overall balance assessment: Needs assistance Sitting-balance support: Single extremity supported;Bilateral upper extremity supported Sitting balance-Leahy Scale: Fair                                      Cognition  Arousal/Alertness: Awake/alert Behavior During Therapy: WFL for tasks assessed/performed Overall Cognitive Status: Within Functional Limits for tasks assessed                                 General Comments: asked mult times if he could just stand up      Exercises      General Comments General comments (skin integrity, edema, etc.): pt was more comfortable with transition to bed due to being more reclined in lift and with less stress near amputation      Pertinent Vitals/Pain Pain Assessment: Faces Faces Pain Scale: Hurts little more Pain Location: L residual limb Pain Descriptors / Indicators: Grimacing;Guarding Pain Intervention(s): Limited activity within patient's tolerance;Monitored during session;Repositioned;Premedicated before session    Home Living                      Prior Function            PT Goals (current goals can now be found in the care plan section) Acute Rehab PT Goals Patient Stated Goal: get home Progress towards PT goals: Progressing toward goals    Frequency    Min 3X/week      PT Plan  Current plan remains appropriate;Other (comment) (Nursing educated on lift)    Co-evaluation              AM-PAC PT "6 Clicks" Mobility   Outcome Measure  Help needed turning from your back to your side while in a flat bed without using bedrails?: A Little Help needed moving from lying on your back to sitting on the side of a flat bed without using bedrails?: A Little Help needed moving to and from a bed to a chair (including a wheelchair)?: A Lot Help needed standing up from a chair using your arms (e.g., wheelchair or bedside chair)?: A Lot Help needed to walk in hospital room?: Total Help needed climbing 3-5 steps with a railing? : Total 6 Click Score: 12    End of Session Equipment Utilized During Treatment: Gait belt Activity Tolerance: Patient tolerated treatment well;Other (comment) (LLE sensitive to use of sling  with maximove) Patient left: with call bell/phone within reach;with nursing/sitter in room;in bed;with bed alarm set Nurse Communication: Mobility status PT Visit Diagnosis: Other abnormalities of gait and mobility (R26.89);History of falling (Z91.81);Pain Pain - Right/Left: Left Pain - part of body: Leg     Time: 6468-0321 PT Time Calculation (min) (ACUTE ONLY): 18 min  Charges:  $Therapeutic Activity: 8-22 mins $Neuromuscular Re-education: 8-22 mins             Ramond Dial 08/20/2020, 4:52 PM  Mee Hives, PT MS Acute Rehab Dept. Number: [place] and [place]

## 2020-08-21 ENCOUNTER — Inpatient Hospital Stay (HOSPITAL_COMMUNITY): Payer: Medicare HMO

## 2020-08-21 DIAGNOSIS — T8740 Infection of amputation stump, unspecified extremity: Secondary | ICD-10-CM | POA: Diagnosis not present

## 2020-08-21 LAB — COMPREHENSIVE METABOLIC PANEL
ALT: 22 U/L (ref 0–44)
AST: 19 U/L (ref 15–41)
Albumin: 2.4 g/dL — ABNORMAL LOW (ref 3.5–5.0)
Alkaline Phosphatase: 71 U/L (ref 38–126)
Anion gap: 5 (ref 5–15)
BUN: 14 mg/dL (ref 8–23)
CO2: 25 mmol/L (ref 22–32)
Calcium: 9.3 mg/dL (ref 8.9–10.3)
Chloride: 109 mmol/L (ref 98–111)
Creatinine, Ser: 0.86 mg/dL (ref 0.61–1.24)
GFR, Estimated: >60 mL/min (ref >60)
Glucose, Bld: 101 mg/dL — ABNORMAL HIGH (ref 70–99)
Potassium: 4.3 mmol/L (ref 3.5–5.1)
Sodium: 139 mmol/L (ref 135–145)
Total Bilirubin: 0.8 mg/dL (ref 0.3–1.2)
Total Protein: 7 g/dL (ref 6.5–8.1)

## 2020-08-21 LAB — CBC WITH DIFFERENTIAL/PLATELET
Abs Immature Granulocytes: 0.02 10*3/uL (ref 0.00–0.07)
Basophils Absolute: 0 10*3/uL (ref 0.0–0.1)
Basophils Relative: 1 %
Eosinophils Absolute: 0.1 10*3/uL (ref 0.0–0.5)
Eosinophils Relative: 3 %
HCT: 35 % — ABNORMAL LOW (ref 39.0–52.0)
Hemoglobin: 10.9 g/dL — ABNORMAL LOW (ref 13.0–17.0)
Immature Granulocytes: 1 %
Lymphocytes Relative: 18 %
Lymphs Abs: 0.7 10*3/uL (ref 0.7–4.0)
MCH: 30.9 pg (ref 26.0–34.0)
MCHC: 31.1 g/dL (ref 30.0–36.0)
MCV: 99.2 fL (ref 80.0–100.0)
Monocytes Absolute: 0.4 10*3/uL (ref 0.1–1.0)
Monocytes Relative: 10 %
Neutro Abs: 2.7 10*3/uL (ref 1.7–7.7)
Neutrophils Relative %: 67 %
Platelets: 312 10*3/uL (ref 150–400)
RBC: 3.53 MIL/uL — ABNORMAL LOW (ref 4.22–5.81)
RDW: 15 % (ref 11.5–15.5)
WBC: 4 10*3/uL (ref 4.0–10.5)
nRBC: 0 % (ref 0.0–0.2)

## 2020-08-21 MED ORDER — PNEUMOCOCCAL VAC POLYVALENT 25 MCG/0.5ML IJ INJ
0.5000 mL | INJECTION | Freq: Once | INTRAMUSCULAR | Status: DC
  Filled 2020-08-21: qty 0.5

## 2020-08-21 NOTE — Progress Notes (Signed)
ORTHOPAEDIC CONSULTATION  REQUESTING PHYSICIAN: Velna Ochs, MD  PCP:  Bernerd Limbo, MD  Chief Complaint: Left AKA stump abscess  HPI: [name] is a 70 y.o. male who requested evaluation of his left AKA stump infection by member of the [facility] team.  Patient had multiple surgeries of the left knee including left knee replacement by Dr. Alvan Dame with multiple revisions secondary to septic arthritis.  Most recently patient had an above-the-knee amputation by Dr. Sharol Given to on 11/17/2019.  He reported he had a few falls directly onto his stump and had increased pain and redness.  He was brought to the emergency room from his assisted living facility and found to have cellulitis of the stump.  Dr. Sharol Given has consulted and his team initially planned for conservative management with IV antibiotics.  Patient did have good improvement and was recommended outpatient follow-up.  Patient then developed some drainage and repeat CT showed similar size of fluid collection.  Patient reports he feels well with no significant pain.  Maybe a little bit of warmth at the stump.  He was asking if Dr. Alvan Dame was available to do a revision above-the-knee amputation.  Past Medical History:  Diagnosis Date   CHF (congestive heart failure) ([place]) 05/2019   Chronic anticoagulation    with Xarelto   Complication of anesthesia    PT STATES HARD TO WAKE UP AFTER ONE SUGERY -STATES THE SURGERY TOOK LONGER THAN EXPECTED.  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   Shortness of breath    WITH EXERTION AND  PAIN   Sleep apnea    uses CPAP,   Past Surgical History:  Procedure Laterality Date   2010 REMOVAL OF LEFT TOTAL KNEE     AMPUTATION Left 11/17/2019   Procedure: AMPUTATION ABOVE KNEE;  Surgeon: Newt Minion, MD;  Location: [place];  Service: Orthopedics;  Laterality: Left;   CARDIAC CATHETERIZATION  2006   CARDIOVERSION N/A 08/28/2012   Procedure: CARDIOVERSION;  Surgeon: Sanda Klein, MD;  Location: [place];  Service: Cardiovascular;  Laterality: N/A;   CARDIOVERSION N/A 09/01/2012   Procedure: CARDIOVERSION;  Surgeon: Pixie Casino, MD;  Location: [place];  Service: Cardiovascular;  Laterality: N/A;   COLONOSCOPY N/A 06/13/2013   Procedure: COLONOSCOPY;  Surgeon: Inda Castle, MD;  Location: [facility];  Service: Endoscopy;  Laterality: N/A;   EXCISIONAL TOTAL KNEE ARTHROPLASTY WITH ANTIBIOTIC SPACERS Left 09/21/2018   Procedure: Resection of tibia versus both components with placement of antibiotic spacer;  Surgeon: Paralee Cancel, MD;  Location: [facility];  Service: Orthopedics;  Laterality: Left;  2.5 hrs   EXCISIONAL TOTAL KNEE ARTHROPLASTY WITH ANTIBIOTIC SPACERS Left 01/02/2019   Procedure: Repeat Washout and placement of antibiotic spacer left knee;  Surgeon: Paralee Cancel, MD;  Location: [facility];  Service: Orthopedics;  Laterality: Left;  2 hrs   HYDROCELECTOY   2012   I & D EXTREMITY Left 08/14/2019   Procedure: IRRIGATION AND DEBRIDEMENT LEFT  KNEE WOUND;  Surgeon: Paralee Cancel, MD;  Location: [facility];  Service: Orthopedics;  Laterality: Left;  60 mins   I & D KNEE WITH POLY EXCHANGE Left 05/17/2019   Procedure: IRRIGATION AND DEBRIDEMENT KNEE WITH POLY EXCHANGE;  Surgeon: Paralee Cancel, MD;  Location: [facility];  Service: Orthopedics;  Laterality: Left;  90 mins   IRRIGATION AND DEBRIDEMENT KNEE Left 04/03/2019   Procedure: REIMPLANTATION OF LEFT KNEE TIBIAL COMPONENT.;  Surgeon: Paralee Cancel, MD;  Location: [facility];  Service: Orthopedics;  Laterality: Left;  need 120 min   IRRIGATION AND  DEBRIDEMENT KNEE Left 11/06/2019   Procedure: IRRIGATION AND DEBRIDEMENT KNEE;  Surgeon: Paralee Cancel, MD;  Location: [facility];  Service: Orthopedics;  Laterality: Left;  90 mins   LEFT KNEE ARTHROSCOPY  1999   LEFT KNEE SURGERY UPPER TIBIAL OSTOMY     LEFT TOTAL KNEE REMOVAL FOR INFECTION  2006   REIMPLANTATION LEFT TOTAL KNEE  2008   REIMPLANTATION LEFT TOTAL KNEE   2006   REIMPLANTATION OF TOTAL KNEE Left 06/23/2012   Procedure: REIMPLANTATION OF LEFT TOTAL KNEE;  Surgeon: Mauri Pole, MD;  Location: [facility];  Service: Orthopedics;  Laterality: Left;   REMOVAL LEFT TOTAL KNEE   2008   REPLACEMENT LEFT KNEE  2002   REPLACEMENT RIGHT KNEE  2003   REVISION LEFT KNEE CAP   2004   RIGHT KNEE ARTHROSCOPY  1998   TEE WITHOUT CARDIOVERSION N/A 08/28/2012   Procedure: TRANSESOPHAGEAL ECHOCARDIOGRAM (TEE);  Surgeon: Sanda Klein, MD;  Location: [place];  Service: Cardiovascular;  Laterality: N/A;   TONSILLECTOMY     TOTAL KNEE REVISION Left 12/15/2015   Procedure: TOTAL KNEE REVISION REPLACEMENT;  Surgeon: Paralee Cancel, MD;  Location: [facility];  Service: Orthopedics;  Laterality: Left;  Adductor Block   Social History   Socioeconomic History   Marital status: Single    Spouse name: Not on file   Number of children: Not on file   Years of education: Not on file   Highest education level: Not on file  Occupational History   Not on file  Tobacco Use   Smoking status: Never   Smokeless tobacco: Never  Vaping Use   Vaping Use: Never used  Substance and Sexual Activity   Alcohol use: Not Currently    Comment: not since 1999. However, had around 6 drinks a day between 1993-1999, roughly.   Drug use: No   Sexual activity: Yes    Partners: Female  Other Topics Concern   Not on file  Social History Narrative   Not on file   Social Determinants of Health   Financial Resource Strain: Not on file  Food Insecurity: Not on file  Transportation Needs: Not on file  Physical Activity: Not on  file  Stress: Not on file  Social Connections: Not on file   Family History  Problem Relation Age of Onset   Heart disease Father 4   Pancreatic cancer Sister    Liver cancer Sister    Cervical cancer Sister    Breast cancer Sister    Diabetes Sister    Colon cancer Neg Hx    Throat cancer Neg Hx    Stomach cancer Neg Hx    Kidney disease Neg Hx    Liver disease Neg Hx    Allergies  Allergen Reactions   Morphine Hives   Prior to Admission medications   Medication Sig Start Date End Date Taking? Authorizing Provider  diltiazem (CARDIZEM CD)  240 MG 24 hr capsule TAKE 1 CAPSULE BY MOUTH DAILY Patient taking differently: Take 240 mg by mouth daily. 10/10/18  Yes Croitoru, Mihai, MD  ferrous sulfate (FERROUSUL) 325 (65 FE) MG tablet Take 1 tablet (325 mg total) by mouth 3 (three) times daily with meals for 14 days. Patient taking differently: Take 325 mg by mouth daily. 08/16/19 10/30/20 Yes Babish, Rodman Key, PA-C  furosemide (LASIX) 20 MG tablet Take 20 mg by mouth daily. 07/28/16  Yes [provider]  gabapentin (NEURONTIN) 800 MG tablet Take 800 mg by mouth 3 (three) times daily. 07/21/20  Yes [provider]  levothyroxine (SYNTHROID) 125 MCG tablet Take 125 mcg by mouth daily before breakfast.  10/31/15  Yes [provider]  metoprolol tartrate (LOPRESSOR) 50 MG tablet TAKE 1 TABLET BY MOUTH TWICE DAILY Patient taking differently: Take 50 mg by mouth 2 (two) times daily. 12/12/18  Yes Croitoru, Mihai, MD  Multiple Vitamin (MULTIVITAMIN WITH MINERALS) TABS Take 1 tablet by mouth daily.   Yes [provider]  naphazoline-glycerin (CLEAR EYES REDNESS) 0.012-0.2 % SOLN Place 1-2 drops into both eyes 4 (four) times daily as needed for eye irritation. 11/21/19  Yes Persons, Bevely Palmer, PA  XARELTO 20 MG TABS tablet TAKE 1 TABLET BY MOUTH DAILY WITH BREAKFAST Patient taking differently: Take 20 mg by mouth daily. 10/08/19  Yes Croitoru, Dani Gobble, MD   CT FEMUR  LEFT WO CONTRAST  Result Date: 08/21/2020 CLINICAL DATA:  Follow-up cellulitis EXAM: CT OF THE LOWER LEFT EXTREMITY WITHOUT CONTRAST TECHNIQUE: Multidetector CT imaging of the lower left extremity was performed according to the standard protocol. COMPARISON:  CT 08/15/2020 FINDINGS: Bones/Joint/Cartilage Postsurgical changes from above knee amputation of the left lower extremity. Residual femoral stem hardware remains within the distal femoral metaphysis. Bony callus formation and adjacent heterotopic ossification along the posteromedial margin of the distal femoral resection margin. No new bony destructive changes. No fracture or dislocation. Hip joint intact. Ligaments Suboptimally assessed by CT. Muscles and Tendons Post amputation changes of the discal musculotendinous structures with mild atrophy of the thigh musculature. No intramuscular fluid collection. Soft tissues Skin thickening and subcutaneous edema at the distal stump. Low-density collection abutting the resection margin of the distal femur measures approximately 7.8 x 2.3 x 6.0 cm (series 11, image 105), previously measured 8.1 x 2.3 x 6.1 cm. The more distal fluid collection within the soft tissues has decreased in size now measuring approximately 3.7 x 1.4 x 3.4 cm (series 10, image 88), previously measured up to 4.5 cm. No soft tissue gas. Redemonstrated left inguinal lymphadenopathy. IMPRESSION: 1. Postsurgical changes from above knee amputation of the left lower extremity. No evidence to suggest osteomyelitis by CT. 2. Low-density collection abutting the resection margin of the distal femur is essentially stable in size from recent prior CT. The more distal fluid collection has slightly decreased in size now measuring approximately 3.7 x 1.4 x 3.4 cm, previously measured up to 4.5 cm. 3. Skin thickening and subcutaneous edema at the distal stump, likely representing cellulitis. No new fluid collections. No soft tissue gas. 4. Redemonstrated  left inguinal lymphadenopathy, likely reactive. Electronically Signed   By: Davina Poke D.O.   On: 08/21/2020 11:08    Positive ROS: All other systems have been reviewed and were otherwise negative with the exception of those mentioned in the HPI and as above.  Physical Exam: General: Alert, no acute distress, large body habitus Cardiovascular: No pedal edema Respiratory: No cyanosis, no use of accessory  musculature GI: No organomegaly, abdomen is soft and non-tender Skin: No lesions in the area of chief complaint Neurologic: Sensation intact distally Psychiatric: Patient is competent for consent with normal mood and affect Lymphatic: No axillary or cervical lymphadenopathy  MUSCULOSKELETAL:  Left lower extremity:  Status post AKA with well-healed incision.  Minimal cellulitis noted on exam today.  Small draining wound on the lateral distal aspect of the stump with clearish yellow fluid.  No frank purulence noted.  Sensation intact to the left stump.  Hip flexion intact.  Assessment: Infection of above-the-knee amputation stump  Plan: Discussed with the patient that his fluid collection on CT is remained stable, does have some drainage, and remains stable medically.  From perspective of myself and Dr. Stann Mainland we would not recommend any acute intervention at this point from our team.  Dr. Alvan Dame is out of town until next week.  I would recommend continuing the plan to follow-up with Dr. Sharol Given for reevaluation, and continued antibiotics as previously recommended by Dr. Sharol Given Dr. Erlinda Hong .     Faythe Casa, [place]    08/21/2020 3:40 PM

## 2020-08-21 NOTE — Progress Notes (Signed)
Occupational Therapy Treatment Patient Details Name: [name] MRN: [id number] DOB: 1950-09-19 Today's Date: 08/21/2020    History of present illness 70 yo male presents to [facility] on 7/7 with falls x2 one on L residual limb, + cellulitis on antibiotics. 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   OT comments  Pt eager to get OOB with therapy assist. Sessions focused on bed mobility to sit EOB, grooming, UB dressing, simulated LB bathing seated EOB, ADL A/E education (reacher and LH bath sponge). Pt was able to describe transfer technique, but was unable to achieve complete stand from EOB despite assist and BUE support. The pt was also able to complete lateral scooting along EOB. pt was only able to complete OOB transfer to recliner with use of a mechanical lift due to deficits in strength, activity tolerance. Pt with Poor insight into deficits and believes he can return to ALF at current level of care. OT will continue to follow acutely to maximize level of function and safety  Follow Up Recommendations  SNF    Equipment Recommendations  Other (comment);3 in 1 bedside commode (TBD at SNF)    Recommendations for Other Services      Precautions / Restrictions Precautions Precautions: Fall Precaution Comments: L AKA Restrictions Weight Bearing Restrictions: Yes LLE Weight Bearing: Non weight bearing       Mobility Bed Mobility Overal bed mobility: Needs Assistance Bed Mobility: Supine to Sit     Supine to sit: Min assist;+2 for physical assistance;HOB elevated     General bed mobility comments: pt pulling up with BUE on therapists, heavy use of bed rails and increased time/use of momentum    Transfers Overall transfer level: Needs assistance Equipment used: 2 person hand held assist Transfers: Sit to/from  Stand;Lateral/Scoot Transfers Sit to Stand: Mod assist;+2 safety/equipment;+2 physical assistance;From elevated surface        Lateral/Scoot Transfers: Min guard General transfer comment: modA to initiate sit-stand from elevated bed. pt attempted x2 as highly motivated, but unable to come to complete stand due to lack of strength/stability even with BUE support on recliner. pt then able to complete x3 lateral scoots along EOB, but limited due to pain. eventually used maximove to safely transfer to chair    Balance Overall balance assessment: Needs assistance Sitting-balance support: Single extremity supported;Bilateral upper extremity supported Sitting balance-Leahy Scale: Fair Sitting balance - Comments: able to laterally weight shift with UE support. lean side to side for simulated LB bathing tasks     Standing balance-Leahy Scale: Poor Standing balance comment: unable to complete full stand even with totalA of 2                           ADL either performed or assessed with clinical judgement   ADL Overall ADL's : Needs assistance/impaired     Grooming: Wash/dry hands;Wash/dry face;Min guard;Sitting           Upper Body Dressing : Min Probation officer Details (indicate cue type and reason): total A using mechanical lift to recliner Toileting- Clothing Manipulation and Hygiene: Total assistance;Bed level               Vision Baseline Vision/History: Wears glasses Wears Glasses: Reading only Patient Visual Report: No change from baseline  Perception     Praxis      Cognition Arousal/Alertness: Awake/alert Behavior During Therapy: WFL for tasks assessed/performed Overall Cognitive Status: Within Functional Limits for tasks assessed                                 General Comments: pt with decreased insight to deficits and need for assist. stating his plan is to return home to ALF, asking to complete stand  with poor insight to safety        Exercises Exercises: General Lower Extremity General Exercises - Lower Extremity Ankle Circles/Pumps: AROM;Right;10 reps;Seated Long Arc Quad: AROM;Right;10 reps;Seated Heel Slides: AROM;Right;10 reps;Seated Hip ABduction/ADduction: AROM;Right;10 reps;Seated Hip Flexion/Marching: AROM;Right;5 reps;Seated Heel Raises: AROM;Right;15 reps;Seated   Shoulder Instructions       General Comments pt wanting to pivot to recliner, eventually had to use maximove, but would benefit from drop arm recliner    Pertinent Vitals/ Pain       Pain Assessment: Faces Faces Pain Scale: Hurts little more Pain Location: L residual limb Pain Descriptors / Indicators: Grimacing;Guarding Pain Intervention(s): Limited activity within patient's tolerance;Monitored during session;Repositioned;Premedicated before session  Home Living                                          Prior Functioning/Environment              Frequency  Min 2X/week        Progress Toward Goals  OT Goals(current goals can now be found in the care plan section)  Progress towards OT goals: OT to reassess next treatment  Acute Rehab OT Goals Patient Stated Goal: get home  Plan Discharge plan remains appropriate    Co-evaluation    PT/OT/SLP Co-Evaluation/Treatment: Yes Reason for Co-Treatment: For patient/therapist safety;To address functional/ADL transfers PT goals addressed during session: Balance;Proper use of DME;Strengthening/ROM OT goals addressed during session: ADL's and self-care;Proper use of Adaptive equipment and DME      AM-PAC OT "6 Clicks" Daily Activity     Outcome Measure   Help from another person eating meals?: None Help from another person taking care of personal grooming?: A Little Help from another person toileting, which includes using toliet, bedpan, or urinal?: Total Help from another person bathing (including washing, rinsing,  drying)?: A Lot Help from another person to put on and taking off regular upper body clothing?: A Little Help from another person to put on and taking off regular lower body clothing?: Total 6 Click Score: 14    End of Session Equipment Utilized During Treatment: Other (comment) (Maxi move)  OT Visit Diagnosis: Other abnormalities of gait and mobility (R26.89);Muscle weakness (generalized) (M62.81);Pain Pain - Right/Left: Left Pain - part of body: Leg (residual limb)   Activity Tolerance Patient limited by fatigue   Patient Left with call bell/phone within reach;in chair   Nurse Communication          Time: 5465-0354 OT Time Calculation (min): 42 min  Charges: OT General Charges $OT Visit: 1 Visit OT Treatments $Therapeutic Activity: 8-22 mins     Britt Bottom 08/21/2020, 4:09 PM

## 2020-08-21 NOTE — Progress Notes (Signed)
Physical Therapy Treatment Patient Details Name: [name] MRN: [id number] DOB: 1950/10/28 Today's Date: 08/21/2020    History of Present Illness 70 yo male presents to [facility] on 7/7 with falls x2 one on L residual limb, + cellulitis on antibiotics. PMHx significant for Lt TKR with I&D 11/06/19, L TKA in 6283 complicated by prosthetic joint infection resuliting in multiple subsequent sx now s/p L AKA 11/2019, A-fib, hypothyroidism, GERD, and CHF.    PT Comments    The pt was eager to participate in session with focus on OOB mobility and transfers at this time. The pt was able to describe transfer technique, but was unable to generate complete stand from EOB despite assist and BUE support. The pt was also able to complete short bout of lateral scooting along EOB, and may benefit from drop-arm recliner to facilitate OOB mobility, but was limited by pain at this time. Currently, the pt was only able to complete OOB transfer to recliner with use of a lift due to deficits in strength, stability, and power at this time. Recommend SNF for rehab to maximize pt independence.     Follow Up Recommendations  SNF;Supervision for mobility/OOB     Equipment Recommendations  None recommended by PT    Recommendations for Other Services       Precautions / Restrictions Precautions Precautions: Fall Precaution Comments: L AKA Restrictions Weight Bearing Restrictions: Yes LLE Weight Bearing: Non weight bearing    Mobility  Bed Mobility Overal bed mobility: Needs Assistance Bed Mobility: Supine to Sit     Supine to sit: Min assist;+2 for physical assistance;HOB elevated     General bed mobility comments: pt pulling up with BUE on therapists, heavy use of bed rails and increased time/use of momentum    Transfers Overall transfer level: Needs assistance Equipment used: 2 person hand held assist Transfers: Sit to/from Stand;Lateral/Scoot Transfers (lift to recliner) Sit to Stand: Mod  assist;+2 safety/equipment;+2 physical assistance;From elevated surface        Lateral/Scoot Transfers: Min guard General transfer comment: modA to initiate sit-stand from elevated bed. pt attempted x2 as highly motivated, but unable to come to complete stand due to lack of strength/stability even with BUE support on recliner. pt then able to complete x3 lateral scoots along EOB, but limited due to pain. eventually used maximove to safely transfer to chair  Ambulation/Gait             General Gait Details: unable   Stairs             Wheelchair Mobility    Modified Rankin (Stroke Patients Only)       Balance Overall balance assessment: Needs assistance Sitting-balance support: Single extremity supported;Bilateral upper extremity supported Sitting balance-Leahy Scale: Fair Sitting balance - Comments: able to laterally weight shift with UE support     Standing balance-Leahy Scale: Poor Standing balance comment: unable to complete full stand even with totalA of 2                            Cognition Arousal/Alertness: Awake/alert Behavior During Therapy: WFL for tasks assessed/performed Overall Cognitive Status: Within Functional Limits for tasks assessed                                 General Comments: pt with slightly decreased insight to deficits and need for assist. stating his plan is  to return home to ALF, asking to complete stand with poor insight to safety      Exercises General Exercises - Lower Extremity Ankle Circles/Pumps: AROM;Right;10 reps;Seated Long Arc Quad: AROM;Right;10 reps;Seated Heel Slides: AROM;Right;10 reps;Seated Hip ABduction/ADduction: AROM;Right;10 reps;Seated Hip Flexion/Marching: AROM;Right;5 reps;Seated Heel Raises: AROM;Right;15 reps;Seated    General Comments General comments (skin integrity, edema, etc.): pt wanting to pivot to recliner, eventually had to use maximove, but would benefit from drop  arm recliner      Pertinent Vitals/Pain Pain Assessment: Faces Faces Pain Scale: Hurts little more Pain Location: L residual limb Pain Descriptors / Indicators: Grimacing;Guarding Pain Intervention(s): Limited activity within patient's tolerance;Monitored during session;Repositioned    Home Living                      Prior Function            PT Goals (current goals can now be found in the care plan section) Acute Rehab PT Goals Patient Stated Goal: get home PT Goal Formulation: With patient Time For Goal Achievement: 08/31/20 Potential to Achieve Goals: Good Progress towards PT goals: Progressing toward goals    Frequency    Min 3X/week      PT Plan Current plan remains appropriate;Other (comment)    Co-evaluation PT/OT/SLP Co-Evaluation/Treatment: Yes Reason for Co-Treatment: For patient/therapist safety;To address functional/ADL transfers PT goals addressed during session: Balance;Proper use of DME;Strengthening/ROM        AM-PAC PT "6 Clicks" Mobility   Outcome Measure  Help needed turning from your back to your side while in a flat bed without using bedrails?: A Little Help needed moving from lying on your back to sitting on the side of a flat bed without using bedrails?: A Little Help needed moving to and from a bed to a chair (including a wheelchair)?: A Lot Help needed standing up from a chair using your arms (e.g., wheelchair or bedside chair)?: A Lot Help needed to walk in hospital room?: Total Help needed climbing 3-5 steps with a railing? : Total 6 Click Score: 12    End of Session Equipment Utilized During Treatment: Gait belt (lift) Activity Tolerance: Patient tolerated treatment well Patient left: with call bell/phone within reach;in chair Nurse Communication: Mobility status PT Visit Diagnosis: Other abnormalities of gait and mobility (R26.89);History of falling (Z91.81);Pain Pain - Right/Left: Left Pain - part of body: Leg      Time: 0929-5747 PT Time Calculation (min) (ACUTE ONLY): 43 min  Charges:  $Therapeutic Exercise: 8-22 mins $Therapeutic Activity: 8-22 mins                    Inocencio Homes, PT, DPT   Acute Rehabilitation Department Pager #: [phone]   Otho Bellows 08/21/2020, 3:14 PM

## 2020-08-21 NOTE — Progress Notes (Signed)
   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  Pt notes that he continues to feel well. He is disappointed to learn that the fluid collections (seen on original CT L Femur) have remained on repeat CT today after a week of IV abx.  He notes that he has not had any pain at the amputation site and that his wound continues to drain. Has not had any fevers.  Has used his CPAP at night.  Objective:  Vital signs in last 24 hours: Vitals:   08/20/20 0717 08/20/20 1357 08/20/20 2134 08/21/20 0829  BP: 121/75 (!) 105/53 131/80 135/80  Pulse: 80 62 86 88  Resp: 17 19 20 17   Temp: 98.7 F (37.1 C) 98.3 F (36.8 C) 98.1 F (36.7 C) 98.2 F (36.8 C)  TempSrc: Oral Oral Oral Oral  SpO2: 97% 95% 95% 97%  Weight:      Height:       Weight change:   Intake/Output Summary (Last 24 hours) at 08/21/2020 1055 Last data filed at 08/21/2020 0647 Gross per 24 hour  Intake --  Output 1675 ml  Net -1675 ml   General: Pleasant, well-appearing gentleman laying comfortably in bed. No acute distress. Head: Normocephalic. Atraumatic. CV: Irregular. No murmurs, rubs, or gallops. No RLE edema, wearing compression stocking Pulmonary: Lungs CTAB. Normal effort. No wheezing or rales. Abdominal: Soft, nontender, nondistended. Normal bowel sounds. Extremities: Palpable radial and DP pulses. Normal ROM. Left AKA. Skin: Warm and dry. Pocket of fluctuance at amputation site, roughly 4-5 inches wide. Erythematous borders much improved. Neuro: A&Ox3. Moves all extremities. Normal sensation. No focal deficit. 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  08/21/20 CT L Femur showing "low density collection abutting the resection margin of the distal femur essentially stable in size from recent prior CT," at 8x2x6cm.  In light of pt's disappointment with the unchanged fluid collection concerning for abscess after 8 days of vancomycin and ceftriaxone, pt requests second Ortho opinion. Requests calling office of Dr. Alvan Dame, whose on call PA will see the pt later. Pt remains afebrile without leukocytosis. -Continue Vancomycin and Ceftriaxone  CHF Benign physical exam this AM without RLE edema. K stable at 4.3 and Creatinine stable at 0.86 -Continue 20mg  Lasix -Continue 50mg  Metoprolol tartate  Afib Hx of Afib, rate controlled and on chronic anticoagulation. Also w/ hx of recurrent VTE. -Continue 240mg  Diltiazem -Continue 20mg  Xarelto  Hypothyroidism -Continue home 172mcg Levothyroxine    LOS: 5 days   London Pepper, Medical Student 08/21/2020, 10:55 AM

## 2020-08-21 NOTE — Plan of Care (Signed)

## 2020-08-21 NOTE — Progress Notes (Signed)
Pharmacy Antibiotic Note  [name] is a 70 y.o. male admitted on 08/14/2020 presenting with leg pain s/p left leg AKA stump fall. Pt reports having weakness with chills and low grade fever x 1 week.  Pharmacy has been consulted vancomycin dosing for suspected cellulitis/concern for sepsis. The patient is also on ceftriaxone IV 2g q24h.    Drainage documented. Otherwise improving. Vanc levels 7/11 with Auc 417 at goal. Renal function stable.   Plan: Continue Vancomycin IV 1250 mg q12h (AUC 417, goal 400-550) Monitor renal function, cultures, clinical status, and length of therapy (pending Dr. Sharol Given assessment) Obtain Vanc levels qMon or if significant change in renal function   Height: 5\' 8"  (172.7 cm) Weight: (!) 182.5 kg (402 lb 5.4 oz) IBW/kg (Calculated) : 68.4  Temp (24hrs), Avg:98.2 F (36.8 C), Min:98.1 F (36.7 C), Max:98.3 F (36.8 C)  Recent Labs  Lab 08/14/20 1254 08/14/20 1937 08/15/20 0259 08/17/20 0401 08/17/20 1901 08/18/20 0839 08/18/20 1805 08/19/20 0216 08/20/20 0333 08/21/20 0124  WBC 5.6  --    < > 4.1  --  3.4*  --  3.8* 3.7* 4.0  CREATININE 1.22  --    < > 0.92  --  0.90  --  1.03 1.02 0.86  LATICACIDVEN <0.3* 1.2  --   --   --   --   --   --   --   --   VANCOTROUGH  --   --   --   --   --   --  17  --   --   --   VANCOPEAK  --   --   --   --  40 32  --   --   --   --    < > = values in this interval not displayed.      Allergies  Allergen Reactions   Morphine Hives    Antimicrobials this admission: 7/7 Vancomycin >> 7/8 Ceftriaxone >>   Vancomycin levels 7/11 VP 32/ VT 17; AUC 417  > clinically ,improving, no change   Microbiology results: 7/7 BCx: NGTD 7/7 UCx: NGTD 7/7 COVID: negative   Thank you for allowing pharmacy to be a part of this patient's care.   Lestine Box, PharmD PGY2 Infectious Diseases Pharmacy Resident   Please check [email or web address] for unit-specific pharmacy phone numbers   Please check AMION for all  [place] phone numbers After 10:00 PM, call [place] [phone]

## 2020-08-22 ENCOUNTER — Other Ambulatory Visit (HOSPITAL_COMMUNITY): Payer: Self-pay

## 2020-08-22 DIAGNOSIS — T8740 Infection of amputation stump, unspecified extremity: Secondary | ICD-10-CM | POA: Diagnosis not present

## 2020-08-22 MED ORDER — AMOXICILLIN-POT CLAVULANATE 875-125 MG PO TABS
1.0000 | ORAL_TABLET | Freq: Two times a day (BID) | ORAL | 0 refills | Status: DC
  Filled 2020-08-22: qty 20, 10d supply, fill #0

## 2020-08-22 MED ORDER — DOXYCYCLINE HYCLATE 100 MG PO TABS
100.0000 mg | ORAL_TABLET | Freq: Two times a day (BID) | ORAL | 0 refills | Status: DC
  Filled 2020-08-22: qty 20, 10d supply, fill #0

## 2020-08-22 MED ORDER — DOXYCYCLINE HYCLATE 100 MG PO TABS
100.0000 mg | ORAL_TABLET | Freq: Two times a day (BID) | ORAL | Status: DC
  Administered 2020-08-22 – 2020-08-23 (×2): 100 mg via ORAL
  Filled 2020-08-22 (×2): qty 1

## 2020-08-22 MED ORDER — AMOXICILLIN-POT CLAVULANATE 875-125 MG PO TABS
1.0000 | ORAL_TABLET | Freq: Two times a day (BID) | ORAL | Status: DC
  Administered 2020-08-22 – 2020-08-23 (×2): 1 via ORAL
  Filled 2020-08-22 (×2): qty 1

## 2020-08-22 NOTE — Plan of Care (Signed)

## 2020-08-22 NOTE — Progress Notes (Signed)
    Subjective:  Overnight Events: No acute overnight events  Patient with no acute concerns this morning and ready for discharge.  Patient unable to discharge because facility had no one available to accept patient.  Patient updated we made attempt today to facilitate discharge.   Objective:  Vital signs in last 24 hours: Vitals:   08/21/20 2316 08/22/20 0337 08/22/20 0900 08/22/20 1556  BP:  130/83 118/76 128/76  Pulse: 71 70 80 83  Resp: 20 16 17 16   Temp:  97.6 F (36.4 C) 98.3 F (36.8 C) 98 F (36.7 C)  TempSrc:  Oral Oral Oral  SpO2: 93% 95% 93% 96%  Weight:      Height:       Supplemental O2: Room Air SpO2: 96 % O2 Flow Rate (L/min): 2 L/min FiO2 (%): 92 %   Physical Exam:   General: Pleasant, moderately obese man lying in bed, no acute distress CV: Regular, no murmurs rubs or gallops Pulmonary: Normal work of breathing, no wheezing or rales Extremities: Compression stocking on right lower extremity, picture of left AKA stump below, serosanguineous and purulent fluid was expressed, overlying erythema continues to improve.     Assessment/Plan:   Principal Problem:   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[place]) Active Problems:   Cellulitis   Cellulitis of left leg   Patient Summary: Hospital day 8 for Garnie Borchardt a 70 y.o. person living with CHF, A. 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  #Cellulitis of amputation site Patient switched to oral antibiotics, doxycycline and Augmentin with plans to discharge with follow-up with Dr. Sharol Given on Monday. SNF unable to take patient today and was told it would be ready for patient on Monday.  We will continue p.o. antibiotics and monitor.    #HFpEF - Home medication 20 mg Lasix as needed.  No weight for last two days, will need to follow up with nursing. Shows net negative 8L on admission,  however unable to correlate weight.  Kidney function is stable. Will continue 20 mg Lasix daily and continue to monitor.  #Atrial fibrillation -Continue 240 mg diltiazem -Continue 20 mg Xarelto  #Hypothyroidism - Continue home 125 mcg levothyroxine  Dispo: Anticipated discharge to  assisted living facility , medically stable ready for discharge pending acceptance at his assisted living facility.  Tamsen Snider, MD PGY3 Internal Medicine Pager: [phone] Please contact the on call pager after 5 pm and on weekends at [phone].

## 2020-08-22 NOTE — Care Management Important Message (Signed)
Important Message  Patient Details  Name: [name] MRN: [id number] Date of Birth: 03-Dec-1950   Medicare Important Message Given:  Yes - Important Message mailed due to current National Emergency   Verbal consent obtained due to current National Emergency  Relationship to patient: Self Contact Name: [place] Call Date: 08/22/20  Time: 1108 Phone: [phone] Outcome: No Answer/Busy Important Message mailed to: Patient address on file   Delorse Lek 08/22/2020, 11:08 AM

## 2020-08-22 NOTE — TOC Progression Note (Addendum)
Transition of Care [facility]) - Progression Note    Patient Details  Name: [name] MRN: [id number] Date of Birth: 09/12/1950  Transition of Care [facility]) CM/SW Contact  Milinda Antis, [place] Phone Number: 08/22/2020, 11:54 AM  Clinical Narrative:    11:33-  CSW received information that the patient was medically ready to d/c today.  CSW called [facility], [phone]) the assisted living facility, and was informed by the receptionist that everyone was at lunch or giving out medications and the receptionist was unaware of what was needed for the patient to transfer back to the facility.  CSW left information with receptionist and requested a returned call to get needed information to d/c patient back to the facility.   14:30-  CSW called the facility after not receiving a returned call.  CSW spoke with the receptionist and was informed that CSW's number was given to someone and the call would be returned.  15:45- CSW called the facility.   After being on hold for 12 minutes, CSW was informed that the facility will need the d/c summary faxed to [phone] and someone at the facility will have to review and confirm receipt of fax.  CSW was then informed that the person who can approve and confirm receipt has left for the day and will not be back until Monday.  CSW informed MD of the above information.     Expected Discharge Plan: Assisted Living Barriers to Discharge: Continued Medical Work up  Expected Discharge Plan and Services Expected Discharge Plan: Assisted Living In-house Referral:  (has wheelchair and RW) Discharge Planning Services: CM Consult Post Acute Care Choice: [place] arrangements for the past 2 months: Assisted Living Facility                 DME Arranged: N/A DME Agency: NA       HH Arranged: PT, OT HH Agency: [facility] Date [place]: 08/19/20 Time [place]: 1407 Representative spoke with at [place]:  [place] ([place]) Interventions    Readmission Risk Interventions Readmission Risk Prevention Plan 11/09/2019  Transportation Screening Complete  PCP or Specialist Appt within 5-7 Days Complete  Home Care Screening Complete  Medication Review (RN CM) Complete  Some recent data might be hidden

## 2020-08-23 ENCOUNTER — Encounter (HOSPITAL_COMMUNITY): Payer: Self-pay | Admitting: Internal Medicine

## 2020-08-23 DIAGNOSIS — T8740 Infection of amputation stump, unspecified extremity: Secondary | ICD-10-CM | POA: Diagnosis not present

## 2020-08-23 LAB — BASIC METABOLIC PANEL
Anion gap: 7 (ref 5–15)
BUN: 18 mg/dL (ref 8–23)
CO2: 25 mmol/L (ref 22–32)
Calcium: 10 mg/dL (ref 8.9–10.3)
Chloride: 106 mmol/L (ref 98–111)
Creatinine, Ser: 1.02 mg/dL (ref 0.61–1.24)
GFR, Estimated: >60 mL/min (ref >60)
Glucose, Bld: 104 mg/dL — ABNORMAL HIGH (ref 70–99)
Potassium: 4.4 mmol/L (ref 3.5–5.1)
Sodium: 138 mmol/L (ref 135–145)

## 2020-08-23 LAB — CBC
HCT: 39.7 % (ref 39.0–52.0)
Hemoglobin: 12.2 g/dL — ABNORMAL LOW (ref 13.0–17.0)
MCH: 30.7 pg (ref 26.0–34.0)
MCHC: 30.7 g/dL (ref 30.0–36.0)
MCV: 100 fL (ref 80.0–100.0)
Platelets: 377 10*3/uL (ref 150–400)
RBC: 3.97 MIL/uL — ABNORMAL LOW (ref 4.22–5.81)
RDW: 14.6 % (ref 11.5–15.5)
WBC: 3.8 10*3/uL — ABNORMAL LOW (ref 4.0–10.5)
nRBC: 0 % (ref 0.0–0.2)

## 2020-08-23 LAB — MAGNESIUM: Magnesium: 2.1 mg/dL (ref 1.7–2.4)

## 2020-08-23 MED ORDER — AMOXICILLIN-POT CLAVULANATE 875-125 MG PO TABS: 1.0000 | ORAL_TABLET | Freq: Two times a day (BID) | ORAL | 0 refills | Status: AC

## 2020-08-23 MED ORDER — DOXYCYCLINE HYCLATE 100 MG PO TABS: 100.0000 mg | ORAL_TABLET | Freq: Two times a day (BID) | ORAL | 0 refills | Status: AC

## 2020-08-23 NOTE — Progress Notes (Signed)
Patient ID: [name], male   DOB: 1950/10/01, 70 y.o.   MRN: [id number] On IV ABX.  Stump with some drainage. Dr. Sharol Given plan in notes reviewed. Has tip of stem of revision TKA still present. Dr. Sharol Given back Monday

## 2020-08-23 NOTE — Discharge Summary (Signed)
Name: [name] MRN: [id number] DOB: 01/18/1951 70 y.o. PCP: Bernerd Limbo, MD  Date of Admission: 08/14/2020 12:41 PM Date of Discharge:   08/23/2020 Attending Physician: Sid Falcon, MD  Discharge Diagnosis: Cellulitis of amputation site CHF Atrial fibrillation Hypothyroidism  Discharge Medications: Allergies as of 08/23/2020       Reactions   Morphine Hives        Medication List     TAKE these medications    amoxicillin-clavulanate 875-125 MG tablet Commonly known as: Augmentin Take 1 tablet by mouth 2 (two) times daily for 10 days.   diltiazem 240 MG 24 hr capsule Commonly known as: CARDIZEM CD TAKE 1 CAPSULE BY MOUTH DAILY   doxycycline 100 MG tablet Commonly known as: VIBRA-TABS Take 1 tablet (100 mg total) by mouth 2 (two) times daily for 10 days.   ferrous sulfate 325 (65 FE) MG tablet Commonly known as: FerrouSul Take 1 tablet (325 mg total) by mouth 3 (three) times daily with meals for 14 days. What changed: when to take this   furosemide 20 MG tablet Commonly known as: LASIX Take 20 mg by mouth daily.   gabapentin 800 MG tablet Commonly known as: NEURONTIN Take 800 mg by mouth 3 (three) times daily.   levothyroxine 125 MCG tablet Commonly known as: SYNTHROID Take 125 mcg by mouth daily before breakfast.   metoprolol tartrate 50 MG tablet Commonly known as: LOPRESSOR TAKE 1 TABLET BY MOUTH TWICE DAILY   multivitamin with minerals Tabs tablet Take 1 tablet by mouth daily.   naphazoline-glycerin 0.012-0.2 % Soln Commonly known as: CLEAR EYES REDNESS Place 1-2 drops into both eyes 4 (four) times daily as needed for eye irritation.   Xarelto 20 MG Tabs tablet Generic drug: rivaroxaban TAKE 1 TABLET BY MOUTH DAILY WITH BREAKFAST What changed:  how much to take when to take this        Disposition and follow-up:   Mr.Cejay Cecilie Lowers was discharged from [facility] in Stable condition.  At the hospital  follow up visit please address:  Infection of above-the-knee amputation stump   Infection of above-the-knee amputation stump: L.AKA. Received 8 days of IV antibiotics, sent out on p.o. doxycycline and Augmentin.  Underlying abscesses unresolved, but cellulitis has improved. Patient to follow-up with Dr. Sharol Given in office on Monday.   Iron deficiency anemia: Patient is on iron supplementation.Studies suggest mild IDA/anemia of chronic inflammation, continued oral iron at discharge. Stable Hgb while admitted. Continue to treat and monitor.  2.  Labs / imaging needed at time of follow-up: None  3.  Pending labs/ test needing follow-up: None  Follow-up Appointments:  Follow-up Information     Newt Minion, MD Follow up in 1 week(s).   Specialty: Orthopedic Surgery Contact information: [place] [address] [address] [place], [place] Follow up.   Specialty: Home Health Services Why: Someone from [facility] will contact you to arrange start date and time for your therapy. Contact information: [address] [place] [address] [phone]                 Hospital Course by problem list:  Infection of above-the-knee stump -  70 y/o male with CHF, Afib and recurrent septic joint arthritis of L knee preceding L AKA. Admitted for cellulitis at amputation site. XR showed some periosteal reaction, concerning for osteomyelitis. Pt started on Vancomycin (7/7) and  Ceftriaxone (7/8). Body habitus limited receipt of MRI. CT L Femur showing 2 fluid pockets as possible abscesses, and no definite evidence of osteomyelitis. Ortho Dr Sharol Given consulted, recommended one week of IV abx followed by PO abx w/ discharge, and clinic f/u on Mo 7/18. Developed open wound at lateral aspect on 7/11. Consulted Ortho again who felt that as long as pt remains aseptic and wound drains, continue course.   Elevated Billirubin  Also w/u possible liver  failure for low macrocytic anemia w/out B vitamin deficiencies, low albumin and total protein, and elevated PT/INR/aPTT. RUQ ultrasound showed nodular hepatic contour.  Patient declined further workup  Iron deficiency anemia Patient is on iron supplementation.Studies suggest mild IDA/anemia of chronic inflammation, continued oral iron at discharge. Stable Hgb while admitted. Continue to treat and monitor.   Discharge Exam:   BP 127/88 (BP Location: Right Wrist)   Pulse 80   Temp 98.7 F (37.1 C) (Oral)   Resp 17   Ht 5\' 8"  (1.727 m)   Wt (!) 179.1 kg   SpO2 96%   BMI 60.04 kg/m   Discharge exam:  General: Pleasant, well-appearing gentleman laying comfortably in bed. No acute distress. Head: Normocephalic. Atraumatic. CV: RRR. No murmurs, rubs, or gallops. No LE edema Pulmonary: Lungs CTAB. Normal effort. No wheezing or rales. Abdominal: Soft, nontender, nondistended. Normal bowel sounds. Extremities: Compression stocking on right lower extremity, serosanguineous and purulent fluid was expressed, overlying erythema continues to improve. Skin: Warm and dry. No obvious rash or lesions. Neuro: A&Ox3. Moves all extremities. Normal sensation. No focal deficit. Psych: Normal mood and affect   Pertinent Labs, Studies, and Procedures:   DG Chest 1 View  Result Date: 08/14/2020 CLINICAL DATA:  Fall, weakness, chills and low-grade fever EXAM: CHEST  1 VIEW COMPARISON:  Radiograph 01/25/2013 FINDINGS: Enlarged cardiomediastinal silhouette. Central pulmonary vascular congestion without overt pulmonary edema. Low lung volumes. There is no large pleural effusion or visible pneumothorax. There is no acute osseous abnormality. IMPRESSION: Cardiomegaly with central pulmonary vascular congestion. Low lung volumes. Electronically Signed   By: Maurine Simmering   On: 08/14/2020 13:54   CT FEMUR LEFT WO CONTRAST  Result Date: 08/15/2020 CLINICAL DATA:  Cellulitis.  Concern for osteomyelitis EXAM: CT OF THE  LOWER LEFT EXTREMITY WITHOUT CONTRAST TECHNIQUE: Multidetector CT imaging of the lower left extremity was performed according to the standard protocol. COMPARISON:  X-ray 08/14/2020 FINDINGS: Bones/Joint/Cartilage Postsurgical changes from above knee amputation of the left lower extremity. Residual femoral stem hardware is seen in the distal femoral diaphysis. Bony callus formation and adjacent heterotopic ossification along the posteromedial margin of the distal femoral resection margin. No definite bony destructive changes. No fracture or dislocation. Hip joint is intact. Ligaments Suboptimally assessed by CT. Muscles and Tendons Mild atrophy of the thigh musculature. Post amputation changes musculotendinous structures distally. No intramuscular fluid collection is seen. Soft tissues Soft tissue swelling at the distal stump. Low-density collection abutting the resection margin of the distal femur measuring approximately 8.1 x 2.3 x 6.1 cm (series 10, image 106). Additional collection within the soft tissues more distally at the distal stump measures approximately 4.6 x 2.6 x 4.0 cm (series 10, image 120). Adjacent soft tissue edema. Skin thickening of the distal stump. No soft tissue gas. Enlarged left inguinal lymph nodes measuring up to 2.6 cm short axis. IMPRESSION: 1. Postsurgical changes from above knee amputation of the left lower extremity. Skin thickening with soft tissue swelling and edema at the distal stump is concerning  for cellulitis. No definite evidence of acute osteomyelitis by CT. 2. Low-density collection abutting the resection margin of the distal femur measuring up to 8.1 cm, nonspecific and may represent postoperative seroma or abscess. 3. Additional collection more distally within the soft tissues of the distal stump measuring up to 4.6 cm with surrounding edema. 4. Enlarged left inguinal lymph nodes measuring up to 2.6 cm short axis, likely reactive. Electronically Signed   By: Davina Poke D.O.   On: 08/15/2020 15:25   DG Femur Min 2 Views Left  Result Date: 08/14/2020 CLINICAL DATA:  cough, stump cellulitis EXAM: LEFT FEMUR 2 VIEWS COMPARISON:  Left knee radiograph 04/04/2019 FINDINGS: There has been interval above knee amputation with residual femoral stem hardware and bony callus. There is soft tissue swelling. There is minimal periosteal reaction without evidence of bony destruction. IMPRESSION: Interval above knee amputation with residual femoral stem hardware can bony callus. Minimal periosteal reaction along the distal femoral stump without evidence of bony destruction. Soft tissue swelling is noted. Electronically Signed   By: Maurine Simmering   On: 08/14/2020 13:52   US Abdomen Limited RUQ (LIVER/GB)  Result Date: 08/15/2020 CLINICAL DATA:  Elevated bilirubin in a 70 year old male. EXAM: ULTRASOUND ABDOMEN LIMITED RIGHT UPPER QUADRANT COMPARISON:  No recent comparison is available. Most recent study for comparison a CT evaluation from December of 2014. FINDINGS: Gallbladder: Question of wall thickening of the gallbladder. Structures poorly evaluated due to patient body habitus. Wall thickness may be as much as 5 mm though the gallbladder is contracted. Cholelithiasis with 1.2 cm gallstone and other small gallstones layering dependently. No reported tenderness over the gallbladder based on sonographer assessment. Common bile duct: Diameter: 6.3 mm. Liver: Liver is very poorly evaluated due to beam attenuation and patient body habitus. Suggestion of nodular hepatic contours raising the question of liver disease. Anechoic lesion in the anterior LEFT liver shows enhanced through transmission and is well-circumscribed along the anterior surface of the liver measuring 0.9 x 1.3 x 1.0 cm. Portal vein is patent on color Doppler imaging with normal direction of blood flow towards the liver. Other: No visible ascites. IMPRESSION: Very limited evaluation shows contracted gallbladder with  thickening of the wall more likely related to under distension of the gallbladder. There is evidence of cholelithiasis but no sign of tenderness by report over the gallbladder. Nodular hepatic contour is suggested raising the question of liver disease. Parenchyma is very poorly evaluated due to patient body habitus. CT may be helpful to evaluate both the gallbladder and liver. Electronically Signed   By: Zetta Bills M.D.   On: 08/15/2020 15:03      Discharge Instructions: Discharge Instructions     Call MD for:  difficulty breathing, headache or visual disturbances   Complete by: As directed    Call MD for:  redness, tenderness, or signs of infection (pain, swelling, redness, odor or green/yellow discharge around incision site)   Complete by: As directed    Call MD for:  severe uncontrolled pain   Complete by: As directed    Call MD for:  temperature >100.4   Complete by: As directed    Diet - low sodium heart healthy   Complete by: As directed    Increase activity slowly   Complete by: As directed       See Dr Sharol Given on Tuesday 08/26/20 in clinic for further evaluation of unchanged fluid collections. Management of PO Abx w Dr Sharol Given.  Signed: Wyonia Fontanella N, DO  08/23/2020, 12:17 PM   [phone]

## 2020-08-23 NOTE — NC FL2 (Signed)
[place] LEVEL OF CARE SCREENING TOOL     IDENTIFICATION  Patient Name: [name] Birthdate: [birth year] Sex: male Admission Date (Current Location): 08/14/2020  [facility] and [place] Number:      Facility and Address:  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) 11/08/2019   Septic joint of left knee joint ([place]) 11/06/2019   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[place]) 08/15/2019   Open knee wound 08/14/2019   S/P left TK revision 04/03/2019   Administration of long-term prophylactic antibiotics    History of streptococcal infection    History of DVT (deep vein thrombosis)    S/P rev left TK 12/15/2015   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   Preoperative cardiovascular examination 04/17/2013   OSA on CPAP 11/27/2012   Chronic diastolic heart failure ([place]) 09/10/2012   Chronic anticoagulation, with Xarelto 09/10/2012   DVT (deep venous thrombosis), possible 09/10/2012   SOB (shortness of breath) 08/26/2012   Chest discomfort 08/26/2012   Persistent atrial fibrillation ([place]) 08/26/2012   Super obesity 06/27/2012   Expected blood loss anemia 06/27/2012   S/P left TK revision 06/23/2012   Septic arthritis of knee ([place])    Cellulitis 09/19/2010   METHICILLIN RESISTANT STAPHYLOCOCCUS AUREUS INFECTION 09/10/2009   STREPTOCOCCUS INFECTION CCE & UNS SITE GROUP C 07/04/2008    DM 07/04/2008   CHRONIC KIDNEY DISEASE UNSPECIFIED 07/04/2008   PULMONARY EMBOLISM, HX OF 07/04/2008   INFECTION DUE TO INTERNAL ORTH DEVICE NEC 03/02/2006    Orientation RESPIRATION BLADDER Height & Weight     Self, Time, Situation, Place  Normal Continent Weight: (!) 394 lb 13.5 oz (179.1 kg) Height:  5\' 8"  (172.7 cm)  BEHAVIORAL SYMPTOMS/MOOD NEUROLOGICAL BOWEL NUTRITION STATUS      Continent Diet (see discharge summary)  AMBULATORY STATUS COMMUNICATION OF NEEDS Skin   Supervision Verbally Other (Comment) (L AKA)                       Personal Care Assistance Level of Assistance  Bathing, Dressing Bathing Assistance: Limited assistance   Dressing Assistance: Limited assistance     Functional Limitations Info             [place]  PT (By licensed PT), OT (By licensed OT)     PT Frequency: per facility OT Frequency: per facility            Contractures      Additional Factors Info  Code Status Code Status Info: DNR             Current Medications (08/23/2020):  This is the current hospital active medication list Current Facility-Administered Medications  Medication Dose Route Frequency Provider Last Rate Last Admin   acetaminophen (TYLENOL) tablet 650 mg  650 mg Oral Q6H PRN Madalyn Rob, MD   650 mg at 08/16/20 2115  Or   acetaminophen (TYLENOL) suppository 650 mg  650 mg Rectal Q6H PRN Madalyn Rob, MD       amoxicillin-clavulanate (AUGMENTIN) 875-125 MG per tablet 1 tablet  1 tablet Oral Q12H Madalyn Rob, MD   1 tablet at 08/23/20 0934   bisacodyl (DULCOLAX) EC tablet 5 mg  5 mg Oral Daily PRN Madalyn Rob, MD       diltiazem (CARDIZEM CD) 24 hr capsule 240 mg  240 mg Oral Daily Madalyn Rob, MD   240 mg at 08/23/20 0934   doxycycline (VIBRA-TABS) tablet 100 mg  100 mg Oral Q12H Madalyn Rob, MD   100 mg at 08/23/20 0934   furosemide (LASIX) tablet 20 mg  20 mg Oral Daily Madalyn Rob, MD   20 mg at 08/23/20 0934    ipratropium-albuterol (DUONEB) 0.5-2.5 (3) MG/3ML nebulizer solution 3 mL  3 mL Nebulization Q4H PRN Madalyn Rob, MD       levothyroxine (SYNTHROID) tablet 125 mcg  125 mcg Oral QAC breakfast Madalyn Rob, MD   125 mcg at 08/23/20 0600   metoprolol tartrate (LOPRESSOR) tablet 50 mg  50 mg Oral BID Madalyn Rob, MD   50 mg at 08/23/20 0934   pneumococcal 23 valent vaccine (PNEUMOVAX-23) injection 0.5 mL  0.5 mL Intramuscular Once Benetta Spar D, RPH       rivaroxaban Alveda Reasons) tablet 20 mg  20 mg Oral Q breakfast Madalyn Rob, MD   20 mg at 08/23/20 0934   senna-docusate (Senokot-S) tablet 1 tablet  1 tablet Oral QHS PRN Madalyn Rob, MD         Discharge Medications: Please see discharge summary for a list of discharge medications.  Relevant Imaging Results:  Relevant Lab Results:   Additional Information    [place], LCSWA

## 2020-08-23 NOTE — TOC Transition Note (Signed)
Transition of Care [facility]) - CM/SW Discharge Note   Patient Details  Name: [name] MRN: [id number] Date of Birth: 1950-03-04  Transition of Care [facility] CM/SW Contact:  Ina Homes, [place] Phone Number: 08/23/2020, 4:13 PM   Clinical Narrative:     SW spoke with [place] ([place]), reports pt able to return today 7/16. D/c packet in chart, requested MD to sign DNR, bedside RN updated, PTAR called.   Final next level of care: Assisted Living Barriers to Discharge: Barriers Resolved   Patient Goals and CMS Choice     Choice offered to / list presented to : Patient  Discharge Placement                Patient to be transferred to facility by: PTAR      Discharge Plan and Services In-house Referral:  (has wheelchair and RW) Discharge Planning Services: CM Consult Post Acute Care Choice: Home Health          DME Arranged: N/A DME Agency: NA       HH Arranged: PT, OT HH Agency: [place] Date [place]: 08/19/20 Time [place]: 1407 Representative spoke with at [place]: [place] ([place]) Interventions     Readmission Risk Interventions Readmission Risk Prevention Plan 11/09/2019  Transportation Screening Complete  PCP or Specialist Appt within 5-7 Days Complete  Home Care Screening Complete  Medication Review (RN CM) Complete  Some recent data might be hidden

## 2020-08-23 NOTE — TOC Progression Note (Signed)
Transition of Care [facility]) - Progression Note    Patient Details  Name: [name] MRN: [id number] Date of Birth: 26-Oct-1950  Transition of Care [facility]) CM/SW [place], [place] Phone Number: 08/23/2020, 1:44 PM  Clinical Narrative:     SW informed by bedside RN that pt has d/c order and PTAR was called. SW informed the bedside RN, per CSW notes yesterday 7/15, pt's facility unable to accept back over the weekend due to admin unavailable until Monday 7/18. SW was informed that the MD called for PTAR, PTAR arrived on unit to get pt but facility is not prepared to accept pt today as they do not have admin.   SW spoke with [name] [facility] [phone]) confirmed they do not have admin to review d/c summary and FL2 for pt to return today. [name] reports she will attempt to locate staff to help facilitate pt returning this weekend, otherwise it will be Monday when admin back in office.  Expected Discharge Plan: Assisted Living Barriers to Discharge: Continued Medical Work up  Expected Discharge Plan and Services Expected Discharge Plan: Assisted Living In-house Referral:  (has wheelchair and RW) Discharge Planning Services: CM Consult Post Acute Care Choice: [place] arrangements for the past 2 months: [place] Expected Discharge Date: 08/23/20               DME Arranged: N/A DME Agency: NA       HH Arranged: PT, OT HH Agency: [place] Date HH Agency Contacted: 08/19/20 Time [place]: [id number] Representative spoke with at [place]: [place] ([place]) Interventions    Readmission Risk Interventions Readmission Risk Prevention Plan 11/09/2019  Transportation Screening Complete  PCP or Specialist Appt within 5-7 Days Complete  Home Care Screening Complete  Medication Review (RN CM) Complete  Some recent data might be hidden

## 2020-08-26 ENCOUNTER — Ambulatory Visit: Payer: Medicare HMO | Admitting: Family

## 2020-08-26 ENCOUNTER — Encounter: Payer: Self-pay | Admitting: Family

## 2020-08-26 ENCOUNTER — Other Ambulatory Visit: Payer: Self-pay

## 2020-08-26 DIAGNOSIS — L03116 Cellulitis of left lower limb: Secondary | ICD-10-CM | POA: Diagnosis not present

## 2020-08-26 DIAGNOSIS — Z89612 Acquired absence of left leg above knee: Secondary | ICD-10-CM | POA: Diagnosis not present

## 2020-08-26 DIAGNOSIS — S78112A Complete traumatic amputation at level between left hip and knee, initial encounter: Secondary | ICD-10-CM

## 2020-08-26 NOTE — Progress Notes (Signed)
Office Visit Note   Patient: [name]           Date of Birth: [birth year]           MRN: [id number] Visit Date: 08/26/2020              Requested by: Bernerd Limbo, MD [place],  [place] [address] PCP: Bernerd Limbo, MD  Chief Complaint  Patient presents with   Left Leg - Pain      HPI: The patient is a 70 year old gentleman who presents today in hospitalization follow-up.  He had been treated for cellulitis to his left above-knee amputation.  He continues on Augmentin and doxycycline.  Overall he is feeling better.  He does not have a shrinker that he is wearing on his left residual limb he does have prosthesis but prefers not to wear it.  Assessment & Plan: Visit Diagnoses:  1. Cellulitis of left leg   2. Above knee amputation of left lower extremity ([place])     Plan: Complete oral antibiotics.  Did provide an order to [facility] clinic for new shrinker.  He will follow-up in the office in 2 weeks for clinical reevaluation overall cellulitis is resolving.  Follow-Up Instructions: No follow-ups on file.   Ortho Exam  Patient is alert, oriented, no adenopathy, well-dressed, normal affect, normal respiratory effort. On examination of the left residual limb there is resolving cellulitis with dry peeling skin there is no erythema or warmth no ascending cellulitis. No drainage.  Imaging: No results found. No images are attached to the encounter.  Labs: Lab Results  Component Value Date   HGBA1C 5.3 08/15/2020   HGBA1C 5.5 11/02/2019   HGBA1C 5.3 08/26/2012   ESRSEDRATE 128 (H) 08/19/2020   ESRSEDRATE 93 (H) 08/17/2020   ESRSEDRATE 96 (H) 08/15/2020   CRP 19.8 (H) 08/19/2020   CRP 27.2 (H) 08/17/2020   CRP 25.9 (H) 08/15/2020   REPTSTATUS 08/19/2020 FINAL 08/14/2020   GRAMSTAIN  11/06/2019    RARE WBC PRESENT,BOTH PMN AND MONONUCLEAR RARE GRAM POSITIVE COCCI IN PAIRS Performed at [facility], [place] [address],  [place], [place] [address]    GRAMSTAIN  11/06/2019    ABUNDANT WBC PRESENT,BOTH PMN AND MONONUCLEAR FEW GRAM POSITIVE COCCI    CULT  08/14/2020    NO GROWTH 5 DAYS Performed at [facility], [place] [address], [place], [place] [address]    LABORGA ENTEROCOCCUS FAECALIS 11/06/2019     Lab Results  Component Value Date   ALBUMIN 2.4 (L) 08/21/2020   ALBUMIN 2.3 (L) 08/20/2020   ALBUMIN 2.3 (L) 08/19/2020    Lab Results  Component Value Date   MG 2.1 08/23/2020   MG 2.1 09/02/2012   MG 2.2 08/30/2012   No results found for: VD25OH  No results found for: PREALBUMIN CBC EXTENDED Latest Ref Rng & Units 08/23/2020 08/21/2020 08/20/2020  WBC 4.0 - 10.5 K/uL 3.8(L) 4.0 3.7(L)  RBC 4.22 - 5.81 MIL/uL 3.97(L) 3.53(L) 3.54(L)  HGB 13.0 - 17.0 g/dL 12.2(L) 10.9(L) 10.8(L)  HCT 39.0 - 52.0 % 39.7 35.0(L) 35.4(L)  PLT 150 - 400 K/uL 377 312 275  NEUTROABS 1.7 - 7.7 K/uL - 2.7 2.4  LYMPHSABS 0.7 - 4.0 K/uL - 0.7 0.8     There is no height or weight on file to calculate BMI.  Orders:  No orders of the defined types were placed in this encounter.  No orders of the defined types were  placed in this encounter.    Procedures: No procedures performed  Clinical Data: No additional findings.  ROS:  All other systems negative, except as noted in the HPI. Review of Systems  Constitutional:  Negative for chills and fever.  Cardiovascular:  Positive for leg swelling.  Skin:  Negative for color change and wound.   Objective: Vital Signs: There were no vitals taken for this visit.  Specialty Comments:  No specialty comments available.  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[place])    Infection of prosthetic left knee joint ([place]) 11/08/2019   Septic joint of left knee joint ([place]) 11/06/2019   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[place]) 08/15/2019   Open knee wound 08/14/2019   S/P  left TK revision 04/03/2019   Administration of long-term prophylactic antibiotics    History of streptococcal infection    History of DVT (deep vein thrombosis)    S/P rev left TK 12/15/2015   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   Preoperative cardiovascular examination 04/17/2013   OSA on CPAP 11/27/2012   Chronic diastolic heart failure ([place]) 09/10/2012   Chronic anticoagulation, with Xarelto 09/10/2012   DVT (deep venous thrombosis), possible 09/10/2012   SOB (shortness of breath) 08/26/2012   Chest discomfort 08/26/2012   Persistent atrial fibrillation ([place]) 08/26/2012   Super obesity 06/27/2012   Expected blood loss anemia 06/27/2012   S/P left TK revision 06/23/2012   Septic arthritis of knee ([place])    Cellulitis 09/19/2010   METHICILLIN RESISTANT STAPHYLOCOCCUS AUREUS INFECTION 09/10/2009   STREPTOCOCCUS INFECTION CCE & UNS SITE GROUP C 07/04/2008   DM 07/04/2008   CHRONIC KIDNEY DISEASE UNSPECIFIED 07/04/2008   PULMONARY EMBOLISM, HX OF 07/04/2008   INFECTION DUE TO INTERNAL ORTH DEVICE NEC 03/02/2006   Past Medical History:  Diagnosis Date   CHF (congestive heart failure) ([place]) 05/2019   Chronic anticoagulation    with Xarelto   Complication of anesthesia    PT STATES HARD TO WAKE UP AFTER ONE SUGERY -[place].  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   Shortness of breath    WITH EXERTION AND PAIN   Sleep apnea    uses CPAP,    Family History  Problem Relation Age of Onset   Heart disease Father 54   Pancreatic cancer  Sister    Liver cancer Sister    Cervical cancer Sister    Breast cancer Sister    Diabetes Sister    Colon cancer Neg Hx    Throat cancer Neg Hx    Stomach cancer Neg Hx    Kidney disease Neg Hx    Liver disease Neg Hx     Past Surgical History:  Procedure Laterality Date   2010 REMOVAL OF LEFT TOTAL KNEE     AMPUTATION Left 11/17/2019   Procedure: AMPUTATION  ABOVE KNEE;  Surgeon: Newt Minion, MD;  Location: [place];  Service: Orthopedics;  Laterality: Left;   CARDIAC CATHETERIZATION  2006   CARDIOVERSION N/A 08/28/2012   Procedure: CARDIOVERSION;  Surgeon: Sanda Klein, MD;  Location: [place];  Service: Cardiovascular;  Laterality: N/A;   CARDIOVERSION N/A 09/01/2012   Procedure: CARDIOVERSION;  Surgeon: Pixie Casino, MD;  Location: [place];  Service: Cardiovascular;  Laterality: N/A;   COLONOSCOPY N/A 06/13/2013   Procedure: COLONOSCOPY;  Surgeon: Inda Castle, MD;  [facility];  Service: Endoscopy;  Laterality: N/A;   EXCISIONAL TOTAL KNEE ARTHROPLASTY WITH ANTIBIOTIC SPACERS Left 09/21/2018   Procedure: Resection of tibia versus both components with placement of antibiotic spacer;  Surgeon: Paralee Cancel, MD;  [facility]: [facility];  Service: Orthopedics;  Laterality: Left;  2.5 hrs   EXCISIONAL TOTAL KNEE ARTHROPLASTY WITH ANTIBIOTIC SPACERS Left 01/02/2019   Procedure: Repeat Washout and placement of antibiotic spacer left knee;  Surgeon: Paralee Cancel, MD;  [facility];  Service: Orthopedics;  Laterality: Left;  2 hrs   HYDROCELECTOY   2012   I & D EXTREMITY Left 08/14/2019   Procedure: IRRIGATION AND DEBRIDEMENT LEFT KNEE WOUND;  Surgeon: Paralee Cancel, MD;  [facility];  Service: Orthopedics;  Laterality: Left;  60 mins   I & D KNEE WITH POLY EXCHANGE Left 05/17/2019   Procedure: IRRIGATION AND DEBRIDEMENT KNEE WITH POLY EXCHANGE;  Surgeon: Paralee Cancel, MD;  [facility];  Service: Orthopedics;  Laterality: Left;  90 mins   IRRIGATION AND DEBRIDEMENT KNEE  Left 04/03/2019   Procedure: REIMPLANTATION OF LEFT KNEE TIBIAL COMPONENT.;  Surgeon: Paralee Cancel, MD;  [facility];  Service: Orthopedics;  Laterality: Left;  need 120 min   IRRIGATION AND DEBRIDEMENT KNEE Left 11/06/2019   Procedure: IRRIGATION AND DEBRIDEMENT KNEE;  Surgeon: Paralee Cancel, MD;  [facility];  Service: Orthopedics;  Laterality: Left;  90 mins   LEFT KNEE ARTHROSCOPY  1999   LEFT KNEE SURGERY UPPER TIBIAL OSTOMY     LEFT TOTAL KNEE REMOVAL FOR INFECTION  2006   REIMPLANTATION LEFT TOTAL KNEE  2008   REIMPLANTATION LEFT TOTAL KNEE   2006   REIMPLANTATION OF TOTAL KNEE Left 06/23/2012   Procedure: REIMPLANTATION OF LEFT TOTAL KNEE;  Surgeon: Mauri Pole, MD;  [facility];  Service: Orthopedics;  Laterality: Left;   REMOVAL LEFT TOTAL KNEE   2008   REPLACEMENT LEFT KNEE  2002   REPLACEMENT RIGHT KNEE  2003   REVISION LEFT KNEE CAP   2004   RIGHT KNEE ARTHROSCOPY  1998   TEE WITHOUT CARDIOVERSION N/A 08/28/2012   Procedure: TRANSESOPHAGEAL ECHOCARDIOGRAM (TEE);  Surgeon: Sanda Klein, MD;  Location: [place];  Service: Cardiovascular;  Laterality: N/A;   TONSILLECTOMY     TOTAL KNEE REVISION Left 12/15/2015   Procedure: TOTAL KNEE REVISION REPLACEMENT;  Surgeon: Paralee Cancel, MD;  [facility];  Service: Orthopedics;  Laterality: Left;  Adductor Block   Social History   Occupational History   Not on file  Tobacco Use   Smoking status: Never   Smokeless tobacco: Never  Vaping Use   Vaping Use: Never used  Substance and Sexual Activity   Alcohol use: Not Currently    Comment: not since 1999. However, had around 6 drinks a day between 1993-1999, roughly.   Drug use: No   Sexual activity: Yes    Partners: Female

## 2020-10-16 ENCOUNTER — Telehealth: Payer: Self-pay | Admitting: Orthopedic Surgery

## 2020-10-16 NOTE — Telephone Encounter (Signed)
[name] with [facility] called stating when a therapist was visiting the pt she noticed a quarter sized red scab that's cool to the touch and bruising. [name] with like a CB to approve orders of a nurse assessment at the pts home.  [name] CB#[phone]

## 2020-10-16 NOTE — Telephone Encounter (Signed)
I called and [facility] and advised that I will call pt to make  an appt for eval. He is a resident at [facility] and states that he does not have transportation for an appt today or tomorrow. He states that he does have an appt with his PCP on Tuesday at 9:30 and wants to start there. I advised the pt that if this changes we are happy to see him at any time. He has a hx of BKA and has developed a new area on the other leg with swelling. Pt states that he would like to see his PCP first and will update me what he wants to do after tha appt. I will hold this message and follow up Tuesday.

## 2020-10-21 NOTE — Telephone Encounter (Signed)
Pt was evaluated by his PCP today. I tried to call and follow up with pt no answer and no voice mail. As advised last week to call at any time we are happy to see the pt in the office. Per office visit note no signs of infection and HHn will continue to provide wound care.

## 2020-11-13 ENCOUNTER — Ambulatory Visit: Payer: Medicare Other | Admitting: Cardiovascular Disease

## 2020-12-02 ENCOUNTER — Telehealth: Payer: Self-pay | Admitting: Orthopedic Surgery

## 2020-12-02 NOTE — Telephone Encounter (Signed)
Patient called needing a Rx for a new prosthetic lt leg. The number to contact patient is [phone]

## 2020-12-03 NOTE — Telephone Encounter (Signed)
done

## 2020-12-11 ENCOUNTER — Telehealth: Payer: Self-pay | Admitting: Orthopedic Surgery

## 2020-12-11 NOTE — Telephone Encounter (Signed)
Amy with [facility] called stating when PT went to visit the pt today, they saw a dime size wound that's has been draining. Amy states its been draining onto his compression sock so she would like to get a nursing order approved. Amy would like a CB to ok the orders please.  Amy# [phone]

## 2020-12-12 NOTE — Telephone Encounter (Signed)
Can you please call this pt and make an appt for eval? Last office visit 08/2020 if new issue needs eval

## 2020-12-15 ENCOUNTER — Telehealth: Payer: Self-pay | Admitting: Orthopedic Surgery

## 2020-12-15 NOTE — Telephone Encounter (Signed)
Pt states his right leg is it in a lot of pain and is having drainage, smell, etc and might be infected. Pt's current appt is sch'd for 12/23/20. Pt did have [facility] coming out to see him but he was told by them that they were no longer allowed to come out per Dr. Sharol Given until he comes in to see him. Pt did not ask for a sooner appt, just wanted [facility] to come out until his next appt with Sharol Given. Pt was agitated on the phone and stated if he can't get this fixed Dr. Sharol Given will no longer be his doctor. The best call back number is [phone].

## 2020-12-15 NOTE — Telephone Encounter (Signed)
I tried to call the pt at the number provided he did not answer. Will hold message and try again.

## 2020-12-15 NOTE — Telephone Encounter (Signed)
I called pt and advised we need to see in office since last visit was 08/2020 and having changes. Pt states he has no transportation and no funds for this. I advised that I would call transportation services with [facility] and arrange for him for an appt on 12/17/20 at [place] to arrange this and will have pt to sign the [place] at appt and fax back.

## 2020-12-16 ENCOUNTER — Telehealth: Payer: Self-pay | Admitting: Orthopedic Surgery

## 2020-12-16 ENCOUNTER — Ambulatory Visit: Payer: Medicare HMO | Admitting: Orthopedic Surgery

## 2020-12-16 NOTE — Telephone Encounter (Signed)
I spoke with [name] at [facility]. She was informed that I will call her tomorrow and give her a verbal over the phone for nursing orders. She asked once he is evaluated if it can be written on his nursing home papers to have a PRN order to clean with regular saline and place dry nonstick dressing over top of wound. She says it does not look bad but it gets stuck on his compression sock and reopens, so it won't ever heal. She says that he doesn't need any sort of cream as it is not infected. I will hold message pending until after appt tomorrow.

## 2020-12-16 NOTE — Telephone Encounter (Signed)
Received call from [place] with [facility] needing treatment orders for patient's right lower leg. The number to contact Janett Billow is [phone]

## 2020-12-17 ENCOUNTER — Encounter: Payer: Self-pay | Admitting: Family

## 2020-12-17 ENCOUNTER — Ambulatory Visit: Payer: Medicare HMO | Admitting: Family

## 2020-12-17 DIAGNOSIS — I87331 Chronic venous hypertension (idiopathic) with ulcer and inflammation of right lower extremity: Secondary | ICD-10-CM

## 2020-12-17 DIAGNOSIS — I872 Venous insufficiency (chronic) (peripheral): Secondary | ICD-10-CM | POA: Diagnosis not present

## 2020-12-17 DIAGNOSIS — L97919 Non-pressure chronic ulcer of unspecified part of right lower leg with unspecified severity: Secondary | ICD-10-CM | POA: Diagnosis not present

## 2020-12-17 NOTE — Telephone Encounter (Signed)
I spoke with Janett Billow gave her instructions to do dynaflex wrap once a week. He needs to come back to see [place] in 2 weeks for a recheck and another wrap.

## 2020-12-17 NOTE — Progress Notes (Signed)
Office Visit Note   Patient: [name]           Date of Birth: January 10, 1951           MRN: [id number] Visit Date: 12/17/2020              Requested by: Bernerd Limbo, MD [address] [place],  [place] [id number] PCP: Bernerd Limbo, MD  No chief complaint on file.     HPI: The patient is a 70 year old gentleman seen today for concern of ulceration to his right lower extremity.  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  He has issues with edema to his right lower extremity chronically.  Venous insufficiency as well as some issues with congestive heart failure.  He today is in a compression garment his home health physical therapy had noticed drainage through his compression garment on the right he states has been ongoing for about a week now  He relates that he currently has an ill fitting prosthesis for his above-knee amputation on the left his amputation was October 2021.  He feels unsafe in his prosthesis states that this often turns in he does not use this often due to instability with where he is also reporting that he has quite a hard time applying his socket  Assessment & Plan: Visit Diagnoses: No diagnosis found.  Plan: We will place him in a Dynaflex compression wrap on the right plan to have this changed in 1 week the patient currently has assistance from home health they will change in 1 week he will follow-up with [place] in 2 weeks for reevaluation.  Anticipate getting him back into his compression garments at that time  We will give him an order for new prosthesis set up.  He is currently working with [employer] clinic.  Follow-Up Instructions: No follow-ups on file.   Ortho Exam  Patient is alert, oriented, no adenopathy, well-dressed, normal affect, normal respiratory effort. On examination of the left residual limb this is well-healed there is no open area no erythema no weeping.  On examination of the right lower extremity he does have  pitting edema up to the thigh.  He has venous ulceration anterior shin this is the size of a half dollar and filled in with full granulation tissue there is some surrounding maceration.  No surrounding erythema no warmth no sign of infection  Imaging: No results found. No images are attached to the encounter.  Labs: Lab Results  Component Value Date   HGBA1C 5.3 08/15/2020   HGBA1C 5.5 11/02/2019   HGBA1C 5.3 08/26/2012   ESRSEDRATE 128 (H) 08/19/2020   ESRSEDRATE 93 (H) 08/17/2020   ESRSEDRATE 96 (H) 08/15/2020   CRP 19.8 (H) 08/19/2020   CRP 27.2 (H) 08/17/2020   CRP 25.9 (H) 08/15/2020   REPTSTATUS 08/19/2020 FINAL 08/14/2020   GRAMSTAIN  11/06/2019    RARE WBC PRESENT,BOTH PMN AND MONONUCLEAR RARE GRAM POSITIVE COCCI IN PAIRS Performed at [facility], [place] [address] [place], [place] [address]    GRAMSTAIN  11/06/2019    ABUNDANT WBC PRESENT,BOTH PMN AND MONONUCLEAR FEW GRAM POSITIVE COCCI    CULT  08/14/2020    NO GROWTH 5 DAYS Performed at [facility], [place] [address], [place],  [address]    LABORGA ENTEROCOCCUS FAECALIS 11/06/2019     Lab Results  Component Value Date   ALBUMIN 2.4 (L) 08/21/2020   ALBUMIN 2.3 (L) 08/20/2020   ALBUMIN 2.3 (  L) 08/19/2020    Lab Results  Component Value Date   MG 2.1 08/23/2020   MG 2.1 09/02/2012   MG 2.2 08/30/2012   No results found for: VD25OH  No results found for: PREALBUMIN CBC EXTENDED Latest Ref Rng & Units 08/23/2020 08/21/2020 08/20/2020  WBC 4.0 - 10.5 K/uL 3.8(L) 4.0 3.7(L)  RBC 4.22 - 5.81 MIL/uL 3.97(L) 3.53(L) 3.54(L)  HGB 13.0 - 17.0 g/dL 12.2(L) 10.9(L) 10.8(L)  HCT 39.0 - 52.0 % 39.7 35.0(L) 35.4(L)  PLT 150 - 400 K/uL 377 312 275  NEUTROABS 1.7 - 7.7 K/uL - 2.7 2.4  LYMPHSABS 0.7 - 4.0 K/uL - 0.7 0.8     There is no height or weight on file to calculate BMI.  Orders:  No orders of the defined types were placed in this encounter.  No orders of the defined types were placed in  this encounter.    Procedures: No procedures performed  Clinical Data: No additional findings.  ROS:  All other systems negative, except as noted in the HPI. Review of Systems  Constitutional:  Negative for chills and fever.  Cardiovascular:  Positive for leg swelling.  Skin:  Positive for wound.   Objective: Vital Signs: There were no vitals taken for this visit.  Specialty Comments:  No specialty comments available.  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[place])    Infection of prosthetic left knee joint ([place]) 11/08/2019   Septic joint of left knee joint ([place]) 11/06/2019   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[place]) 08/15/2019   Open knee wound 08/14/2019   S/P left TK revision 04/03/2019   Administration of long-term prophylactic antibiotics    History of streptococcal infection    History of DVT (deep vein thrombosis)    S/P rev left TK 12/15/2015   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   Preoperative cardiovascular examination 04/17/2013   OSA on CPAP 11/27/2012   Chronic diastolic heart failure ([place]) 09/10/2012   Chronic anticoagulation, with Xarelto 09/10/2012   DVT (deep venous thrombosis), possible 09/10/2012   SOB (shortness of breath) 08/26/2012   Chest discomfort 08/26/2012   Persistent atrial fibrillation ([place]) 08/26/2012   Super obesity 06/27/2012   Expected blood loss anemia 06/27/2012   S/P left TK revision 06/23/2012   Septic arthritis of knee ([place])    Cellulitis 09/19/2010   METHICILLIN RESISTANT STAPHYLOCOCCUS AUREUS INFECTION 09/10/2009   STREPTOCOCCUS INFECTION CCE & UNS SITE GROUP C 07/04/2008   DM 07/04/2008   CHRONIC KIDNEY DISEASE UNSPECIFIED 07/04/2008   PULMONARY EMBOLISM, HX OF 07/04/2008   INFECTION DUE TO INTERNAL ORTH DEVICE NEC 03/02/2006   Past Medical History:  Diagnosis Date   CHF (congestive heart failure) ([address]) 05/2019   Chronic  anticoagulation    with Xarelto   Complication of anesthesia    PT STATES HARD TO WAKE UP AFTER ONE SUGERY -[place].  NO PROBLEMS WITH ANY OTHER SURGERY   Complication of anesthesia    in 12/2018-states started  shaking after surgery, could not get warm    Dysrhythmia    A-fib   GERD (gastroesophageal reflux disease)    History of blood clots    History of blood transfusion    Hypothyroidism    Pain    BACK PAIN - PT ATTRIBUTES TO THE WAY HE WALKS DUE TO LEFT KNEE PROBLEM   Persistent atrial  fibrillation (HCC)    Septic arthritis of knee (H[facility])    LEFT KNEE   Shortness of breath    WITH EXERTION AND PAIN   Sleep apnea    uses CPAP,    Family History  Problem Relation Age of Onset   Heart disease Father 83   Pancreatic cancer Sister    Liver cancer Sister    Cervical cancer Sister    Breast cancer Sister    Diabetes Sister    Colon cancer Neg Hx    Throat cancer Neg Hx    Stomach cancer Neg Hx    Kidney disease Neg Hx    Liver disease Neg Hx     Past Surgical History:  Procedure Laterality Date   2010 REMOVAL OF LEFT TOTAL KNEE     AMPUTATION Left 11/17/2019   Procedure: AMPUTATION ABOVE KNEE;  Surgeon: Newt Minion, MD;  Location: [place];  Service: Orthopedics;  Laterality: Left;   CARDIAC CATHETERIZATION  2006   CARDIOVERSION N/A 08/28/2012   Procedure: CARDIOVERSION;  Surgeon: Sanda Klein, MD;  Location: [place];  Service: Cardiovascular;  Laterality: N/A;   CARDIOVERSION N/A 09/01/2012   Procedure: CARDIOVERSION;  Surgeon: Pixie Casino, MD;  Location: [facility];  Service: Cardiovascular;  Laterality: N/A;   COLONOSCOPY N/A 06/13/2013   Procedure: COLONOSCOPY;  Surgeon: Inda Castle, MD;  [facility];  Service: Endoscopy;  Laterality: N/A;   EXCISIONAL TOTAL KNEE ARTHROPLASTY WITH ANTIBIOTIC SPACERS Left 09/21/2018   Procedure: Resection of tibia versus both components with placement of antibiotic spacer;  Surgeon: Paralee Cancel, MD;  [facility];  Service: Orthopedics;  Laterality: Left;  2.5 hrs   EXCISIONAL TOTAL KNEE ARTHROPLASTY WITH ANTIBIOTIC SPACERS Left 01/02/2019   Procedure: Repeat Washout and placement of antibiotic spacer left knee;  Surgeon: Paralee Cancel, MD;  [facility];  Service: Orthopedics;  Laterality: Left;  2 hrs   HYDROCELECTOY   2012   I & D EXTREMITY Left 08/14/2019   Procedure: IRRIGATION AND DEBRIDEMENT LEFT KNEE WOUND;  Surgeon: Paralee Cancel, MD;  [facility];  Service: Orthopedics;  Laterality: Left;  60 mins   I & D KNEE WITH POLY EXCHANGE Left 05/17/2019   Procedure: IRRIGATION AND DEBRIDEMENT KNEE WITH POLY EXCHANGE;  Surgeon: Paralee Cancel, MD;  [facility];  Service: Orthopedics;  Laterality: Left;  90 mins   IRRIGATION AND DEBRIDEMENT KNEE Left 04/03/2019   Procedure: REIMPLANTATION OF LEFT KNEE TIBIAL COMPONENT.;  Surgeon: Paralee Cancel, MD;  [facility];  Service: Orthopedics;  Laterality: Left;  need 120 min   IRRIGATION AND DEBRIDEMENT KNEE Left 11/06/2019   Procedure: IRRIGATION AND DEBRIDEMENT KNEE;  Surgeon: Paralee Cancel, MD;  [facility];  Service: Orthopedics;  Laterality: Left;  90 mins   LEFT KNEE ARTHROSCOPY  1999   LEFT KNEE SURGERY UPPER TIBIAL OSTOMY     LEFT TOTAL KNEE REMOVAL FOR INFECTION  2006   REIMPLANTATION LEFT TOTAL KNEE  2008   REIMPLANTATION LEFT TOTAL KNEE   2006   REIMPLANTATION OF TOTAL KNEE Left 06/23/2012   Procedure: REIMPLANTATION OF LEFT TOTAL KNEE;  Surgeon: Mauri Pole, MD;  [facility];  Service: Orthopedics;  Laterality: Left;   REMOVAL LEFT TOTAL KNEE   2008   REPLACEMENT LEFT KNEE  2002   REPLACEMENT RIGHT KNEE  2003   REVISION LEFT KNEE CAP   2004   RIGHT KNEE ARTHROSCOPY  1998   TEE WITHOUT CARDIOVERSION N/A 08/28/2012  Procedure: TRANSESOPHAGEAL ECHOCARDIOGRAM (TEE);  Surgeon: Sanda Klein, MD;  Location: [place];  Service: Cardiovascular;  Laterality: N/A;   TONSILLECTOMY     TOTAL KNEE REVISION  Left 12/15/2015   Procedure: TOTAL KNEE REVISION REPLACEMENT;  Surgeon: Paralee Cancel, MD;  [facility];  Service: Orthopedics;  Laterality: Left;  Adductor Block   Social History   Occupational History   Not on file  Tobacco Use   Smoking status: Never   Smokeless tobacco: Never  Vaping Use   Vaping Use: Never used  Substance and Sexual Activity   Alcohol use: Not Currently    Comment: not since 1999. However, had around 6 drinks a day between 1993-1999, roughly.   Drug use: No   Sexual activity: Yes    Partners: Female

## 2020-12-17 NOTE — Telephone Encounter (Signed)
Per Junie Panning, give instructions to [facility] nursing to rewrap right leg in dynaflex in one week. F/u in office here in 2 weeks for another wrap.

## 2020-12-22 ENCOUNTER — Ambulatory Visit: Payer: Medicare HMO | Admitting: Orthopedic Surgery

## 2020-12-23 ENCOUNTER — Ambulatory Visit: Payer: Medicare HMO | Admitting: Orthopedic Surgery

## 2021-01-15 ENCOUNTER — Inpatient Hospital Stay (HOSPITAL_COMMUNITY)
Admission: EM | Admit: 2021-01-15 | Discharge: 2021-01-21 | DRG: 871 | Disposition: A | Discharge status: Nursing Home (Includes ICF) | Payer: Medicare HMO | Source: Skilled Nursing Facility | Attending: Internal Medicine | Admitting: Internal Medicine

## 2021-01-15 ENCOUNTER — Emergency Department (HOSPITAL_COMMUNITY): Payer: Medicare HMO

## 2021-01-15 ENCOUNTER — Encounter (HOSPITAL_COMMUNITY): Payer: Self-pay

## 2021-01-15 ENCOUNTER — Other Ambulatory Visit: Payer: Self-pay

## 2021-01-15 DIAGNOSIS — Z803 Family history of malignant neoplasm of breast: Secondary | ICD-10-CM | POA: Diagnosis not present

## 2021-01-15 DIAGNOSIS — G8929 Other chronic pain: Secondary | ICD-10-CM | POA: Diagnosis present

## 2021-01-15 DIAGNOSIS — J9602 Acute respiratory failure with hypercapnia: Secondary | ICD-10-CM

## 2021-01-15 DIAGNOSIS — Z833 Family history of diabetes mellitus: Secondary | ICD-10-CM | POA: Diagnosis not present

## 2021-01-15 DIAGNOSIS — J9601 Acute respiratory failure with hypoxia: Secondary | ICD-10-CM

## 2021-01-15 DIAGNOSIS — Z8249 Family history of ischemic heart disease and other diseases of the circulatory system: Secondary | ICD-10-CM | POA: Diagnosis not present

## 2021-01-15 DIAGNOSIS — Z89612 Acquired absence of left leg above knee: Secondary | ICD-10-CM | POA: Diagnosis not present

## 2021-01-15 DIAGNOSIS — I4819 Other persistent atrial fibrillation: Secondary | ICD-10-CM | POA: Diagnosis present

## 2021-01-15 DIAGNOSIS — E039 Hypothyroidism, unspecified: Secondary | ICD-10-CM | POA: Diagnosis present

## 2021-01-15 DIAGNOSIS — U071 COVID-19: Secondary | ICD-10-CM | POA: Diagnosis present

## 2021-01-15 DIAGNOSIS — J1282 Pneumonia due to coronavirus disease 2019: Secondary | ICD-10-CM | POA: Diagnosis present

## 2021-01-15 DIAGNOSIS — Z79890 Hormone replacement therapy (postmenopausal): Secondary | ICD-10-CM | POA: Diagnosis not present

## 2021-01-15 DIAGNOSIS — G4733 Obstructive sleep apnea (adult) (pediatric): Secondary | ICD-10-CM

## 2021-01-15 DIAGNOSIS — Z800 Family history of malignant neoplasm of digestive organs: Secondary | ICD-10-CM | POA: Diagnosis not present

## 2021-01-15 DIAGNOSIS — Z79899 Other long term (current) drug therapy: Secondary | ICD-10-CM

## 2021-01-15 DIAGNOSIS — Z7901 Long term (current) use of anticoagulants: Secondary | ICD-10-CM | POA: Diagnosis not present

## 2021-01-15 DIAGNOSIS — R0902 Hypoxemia: Secondary | ICD-10-CM

## 2021-01-15 DIAGNOSIS — D539 Nutritional anemia, unspecified: Secondary | ICD-10-CM

## 2021-01-15 DIAGNOSIS — M79604 Pain in right leg: Secondary | ICD-10-CM | POA: Diagnosis present

## 2021-01-15 DIAGNOSIS — E662 Morbid (severe) obesity with alveolar hypoventilation: Secondary | ICD-10-CM | POA: Diagnosis present

## 2021-01-15 DIAGNOSIS — A4189 Other specified sepsis: Secondary | ICD-10-CM | POA: Diagnosis present

## 2021-01-15 DIAGNOSIS — I5032 Chronic diastolic (congestive) heart failure: Secondary | ICD-10-CM | POA: Diagnosis present

## 2021-01-15 DIAGNOSIS — K219 Gastro-esophageal reflux disease without esophagitis: Secondary | ICD-10-CM | POA: Diagnosis present

## 2021-01-15 DIAGNOSIS — Z8049 Family history of malignant neoplasm of other genital organs: Secondary | ICD-10-CM | POA: Diagnosis not present

## 2021-01-15 DIAGNOSIS — M79605 Pain in left leg: Secondary | ICD-10-CM | POA: Diagnosis present

## 2021-01-15 DIAGNOSIS — Z8614 Personal history of Methicillin resistant Staphylococcus aureus infection: Secondary | ICD-10-CM

## 2021-01-15 DIAGNOSIS — Z6841 Body Mass Index (BMI) 40.0 and over, adult: Secondary | ICD-10-CM | POA: Diagnosis not present

## 2021-01-15 DIAGNOSIS — J159 Unspecified bacterial pneumonia: Secondary | ICD-10-CM | POA: Diagnosis present

## 2021-01-15 DIAGNOSIS — Z885 Allergy status to narcotic agent status: Secondary | ICD-10-CM

## 2021-01-15 DIAGNOSIS — Z86718 Personal history of other venous thrombosis and embolism: Secondary | ICD-10-CM

## 2021-01-15 DIAGNOSIS — A419 Sepsis, unspecified organism: Secondary | ICD-10-CM | POA: Diagnosis not present

## 2021-01-15 LAB — CBC WITH DIFFERENTIAL/PLATELET
Abs Immature Granulocytes: 0.02 10*3/uL (ref 0.00–0.07)
Basophils Absolute: 0 10*3/uL (ref 0.0–0.1)
Basophils Relative: 0 %
Eosinophils Absolute: 0 10*3/uL (ref 0.0–0.5)
Eosinophils Relative: 0 %
HCT: 33.9 % — ABNORMAL LOW (ref 39.0–52.0)
Hemoglobin: 10.1 g/dL — ABNORMAL LOW (ref 13.0–17.0)
Immature Granulocytes: 0 %
Lymphocytes Relative: 9 %
Lymphs Abs: 0.6 10*3/uL — ABNORMAL LOW (ref 0.7–4.0)
MCH: 30.6 pg (ref 26.0–34.0)
MCHC: 29.8 g/dL — ABNORMAL LOW (ref 30.0–36.0)
MCV: 102.7 fL — ABNORMAL HIGH (ref 80.0–100.0)
Monocytes Absolute: 0.6 10*3/uL (ref 0.1–1.0)
Monocytes Relative: 10 %
Neutro Abs: 5.3 10*3/uL (ref 1.7–7.7)
Neutrophils Relative %: 81 %
Platelets: 186 10*3/uL (ref 150–400)
RBC: 3.3 MIL/uL — ABNORMAL LOW (ref 4.22–5.81)
RDW: 16.4 % — ABNORMAL HIGH (ref 11.5–15.5)
WBC: 6.6 10*3/uL (ref 4.0–10.5)
nRBC: 0 % (ref 0.0–0.2)

## 2021-01-15 LAB — COMPREHENSIVE METABOLIC PANEL
ALT: 14 U/L (ref 0–44)
AST: 18 U/L (ref 15–41)
Albumin: 3.1 g/dL — ABNORMAL LOW (ref 3.5–5.0)
Alkaline Phosphatase: 67 U/L (ref 38–126)
Anion gap: 5 (ref 5–15)
BUN: 19 mg/dL (ref 8–23)
CO2: 30 mmol/L (ref 22–32)
Calcium: 9.1 mg/dL (ref 8.9–10.3)
Chloride: 103 mmol/L (ref 98–111)
Creatinine, Ser: 1.1 mg/dL (ref 0.61–1.24)
GFR, Estimated: >60 mL/min (ref >60)
Glucose, Bld: 121 mg/dL — ABNORMAL HIGH (ref 70–99)
Potassium: 4.3 mmol/L (ref 3.5–5.1)
Sodium: 138 mmol/L (ref 135–145)
Total Bilirubin: 0.8 mg/dL (ref 0.3–1.2)
Total Protein: 7 g/dL (ref 6.5–8.1)

## 2021-01-15 LAB — FIBRINOGEN: Fibrinogen: 626 mg/dL — ABNORMAL HIGH (ref 210–475)

## 2021-01-15 LAB — D-DIMER, QUANTITATIVE: D-Dimer, Quant: 0.38 ug/mL-FEU (ref 0.00–0.50)

## 2021-01-15 LAB — PROCALCITONIN: Procalcitonin: 0.2 ng/mL

## 2021-01-15 LAB — TRIGLYCERIDES: Triglycerides: 31 mg/dL (ref <150)

## 2021-01-15 LAB — BRAIN NATRIURETIC PEPTIDE: B Natriuretic Peptide: 260.9 pg/mL — ABNORMAL HIGH (ref 0.0–100.0)

## 2021-01-15 LAB — FERRITIN: Ferritin: 92 ng/mL (ref 24–336)

## 2021-01-15 LAB — LACTATE DEHYDROGENASE: LDH: 150 U/L (ref 98–192)

## 2021-01-15 LAB — LACTIC ACID, PLASMA: Lactic Acid, Venous: 1 mmol/L (ref 0.5–1.9)

## 2021-01-15 LAB — RESP PANEL BY RT-PCR (FLU A&B, COVID) ARPGX2
Influenza A by PCR: NEGATIVE
Influenza B by PCR: NEGATIVE
SARS Coronavirus 2 by RT PCR: POSITIVE — AB

## 2021-01-15 LAB — C-REACTIVE PROTEIN: CRP: 12.4 mg/dL — ABNORMAL HIGH (ref <1.0)

## 2021-01-15 MED ORDER — GUAIFENESIN-DM 100-10 MG/5ML PO SYRP: 10.0000 mL | ORAL_SOLUTION | ORAL | Status: DC | PRN

## 2021-01-15 MED ORDER — CEFEPIME HCL 2 G IJ SOLR
2.0000 g | Freq: Once | INTRAMUSCULAR | Status: AC
  Administered 2021-01-15: 2 g via INTRAVENOUS
  Filled 2021-01-15: qty 2

## 2021-01-15 MED ORDER — IPRATROPIUM-ALBUTEROL 20-100 MCG/ACT IN AERS
1.0000 | INHALATION_SPRAY | Freq: Three times a day (TID) | RESPIRATORY_TRACT | Status: DC
  Administered 2021-01-16 – 2021-01-20 (×13): 1 via RESPIRATORY_TRACT
  Filled 2021-01-15: qty 4

## 2021-01-15 MED ORDER — VANCOMYCIN HCL 1250 MG/250ML IV SOLN
1250.0000 mg | Freq: Two times a day (BID) | INTRAVENOUS | Status: DC
Start: 2021-01-15 — End: 2021-01-16
  Administered 2021-01-15 – 2021-01-16 (×2): 1250 mg via INTRAVENOUS
  Filled 2021-01-15 (×3): qty 250

## 2021-01-15 MED ORDER — SODIUM CHLORIDE 0.9 % IV SOLN
2.0000 g | Freq: Three times a day (TID) | INTRAVENOUS | Status: DC
  Administered 2021-01-15 – 2021-01-16 (×3): 2 g via INTRAVENOUS
  Filled 2021-01-15 (×3): qty 2

## 2021-01-15 MED ORDER — IPRATROPIUM-ALBUTEROL 20-100 MCG/ACT IN AERS
1.0000 | INHALATION_SPRAY | Freq: Four times a day (QID) | RESPIRATORY_TRACT | Status: DC
  Filled 2021-01-15: qty 4

## 2021-01-15 MED ORDER — ACETAMINOPHEN 325 MG PO TABS: 650.0000 mg | ORAL_TABLET | Freq: Four times a day (QID) | ORAL | Status: DC | PRN

## 2021-01-15 MED ORDER — ASCORBIC ACID 500 MG PO TABS
500.0000 mg | ORAL_TABLET | Freq: Every day | ORAL | Status: DC
  Administered 2021-01-15 – 2021-01-20 (×6): 500 mg via ORAL
  Filled 2021-01-15 (×6): qty 1

## 2021-01-15 MED ORDER — VANCOMYCIN HCL 2000 MG/400ML IV SOLN
2000.0000 mg | Freq: Once | INTRAVENOUS | Status: AC
  Administered 2021-01-15: 2000 mg via INTRAVENOUS
  Filled 2021-01-15: qty 400

## 2021-01-15 MED ORDER — SODIUM CHLORIDE 0.9 % IV SOLN
200.0000 mg | Freq: Once | INTRAVENOUS | Status: DC
  Filled 2021-01-15: qty 40

## 2021-01-15 MED ORDER — ACETAMINOPHEN 500 MG PO TABS
1000.0000 mg | ORAL_TABLET | Freq: Once | ORAL | Status: AC
  Administered 2021-01-15: 1000 mg via ORAL
  Filled 2021-01-15: qty 2

## 2021-01-15 MED ORDER — SODIUM CHLORIDE 0.9 % IV SOLN: 100.0000 mg | Freq: Every day | INTRAVENOUS | Status: DC

## 2021-01-15 MED ORDER — SODIUM CHLORIDE 0.9 % IV SOLN
1.0000 mg/kg | Freq: Two times a day (BID) | INTRAVENOUS | Status: DC
  Administered 2021-01-15 – 2021-01-16 (×2): 191.25 mg via INTRAVENOUS
  Filled 2021-01-15 (×3): qty 3.06

## 2021-01-15 MED ORDER — ZINC SULFATE 220 (50 ZN) MG PO CAPS
220.0000 mg | ORAL_CAPSULE | Freq: Every day | ORAL | Status: DC
  Administered 2021-01-15 – 2021-01-20 (×6): 220 mg via ORAL
  Filled 2021-01-15 (×6): qty 1

## 2021-01-15 MED ORDER — PREDNISONE 20 MG PO TABS: 50.0000 mg | ORAL_TABLET | Freq: Every day | ORAL | Status: DC

## 2021-01-15 NOTE — ED Notes (Signed)
Hospitalist at the bedside 

## 2021-01-15 NOTE — Progress Notes (Signed)
Pharmacy Antibiotic Note  [name] is a 70 y.o. male admitted on 01/15/2021 with pneumonia, Covid positive.  Pharmacy has been consulted for Cefepime & Vancomycin dosing.  Plan: Cefepime 2gm q8 Vanc 2gm x1, then 1250mg  q12, AUC 472, SCr 1.1,  Weight: (!) 191.4 kg (422 lb) (Per Bariatric bed)  Temp (24hrs), Avg:102.9 F (39.4 C), Min:102.9 F (39.4 C), Max:102.9 F (39.4 C)  Recent Labs  Lab 01/15/21 0945  WBC 6.6  CREATININE 1.10  LATICACIDVEN 1.0    Estimated Creatinine Clearance: 105.4 mL/min (by C-G formula based on SCr of 1.1 mg/dL).    Allergies  Allergen Reactions   Morphine Hives    Antimicrobials this admission: 12/8 Cefepime >>  12/8 Vancomycin  >>   Dose adjustments this admission:  Microbiology results: 12/8 BCx: sent  Thank you for allowing pharmacy to be a part of this patient's care.  Minda Ditto PharmD 01/15/2021 1:47 PM

## 2021-01-15 NOTE — Progress Notes (Signed)
A consult was received from an ED physician for Cefepime & Vancomycin per pharmacy dosing.  The patient's profile has been reviewed for ht/wt/allergies/indication/available labs.    A one time order has been placed for Cefepime 2gm, Vancomycin 2gm.  Further antibiotics/pharmacy consults should be ordered by admitting physician if indicated.                       Thank you,  Minda Ditto PharmD 01/15/2021  11:45 AM

## 2021-01-15 NOTE — ED Notes (Signed)
Delay in off loading pt from EMS stretcher Staff waiting on bariatric bed

## 2021-01-15 NOTE — ED Notes (Signed)
Changed pts purwick

## 2021-01-15 NOTE — H&P (Signed)
History and Physical    [name] FYB:[id number] DOB: 04/30/1950 DOA: 01/15/2021  PCP: Bernerd Limbo, MD  Patient coming from: [place]  Chief Complaint: dyspnea  HPI: [name] is a 70 y.o. male with medical history significant of CHF, a fib on xarelto, hypothyroidism, morbid obesity. History is limited as patient says he doesn't know why he's here. Per chart review, the patient has had 2 days of dyspnea and cough at his SNF. He was tested for COVID yesterday and was found to be positive. He had a COVID booster, but otherwise had symptomatic treatment. This morning he was apparently febrile, tachypneic and tachycardic. The facility was concerned and called for EMS.     ED Course: He was started on NRB, but weaned to 4L [place]. He was found to be febrile to 102.9. He was covid positive. CXR was concerning for PNA. He was started on remdes and CAP coverage. [facility] was called for admission.   Review of Systems: Review of systems is otherwise negative for all not mentioned in HPI.   PMHx Past Medical History:  Diagnosis Date   CHF (congestive heart failure) ([place]) 05/2019   Chronic anticoagulation    with Xarelto   Complication of anesthesia    PT STATES HARD TO WAKE UP AFTER ONE SUGERY -STATES THE SURGERY TOOK LONGER THAN EXPECTED.  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   Shortness of breath    WITH EXERTION AND PAIN   Sleep apnea    uses CPAP,    PSHx Past Surgical History:  Procedure Laterality Date   2010 REMOVAL OF LEFT TOTAL KNEE     AMPUTATION Left  11/17/2019   Procedure: AMPUTATION ABOVE KNEE;  Surgeon: Newt Minion, MD;  Location: [place];  Service: Orthopedics;  Laterality: Left;   CARDIAC CATHETERIZATION  2006   CARDIOVERSION N/A 08/28/2012   Procedure: CARDIOVERSION;  Surgeon: Sanda Klein, MD;  Location: [place];  Service: Cardiovascular;  Laterality: N/A;   CARDIOVERSION N/A 09/01/2012   Procedure: CARDIOVERSION;  Surgeon: Pixie Casino, MD;  Location: [place];  Service: Cardiovascular;  Laterality: N/A;   COLONOSCOPY N/A 06/13/2013   Procedure: COLONOSCOPY;  Surgeon: Inda Castle, MD;  [facility];  Service: Endoscopy;  Laterality: N/A;   EXCISIONAL TOTAL KNEE ARTHROPLASTY WITH ANTIBIOTIC SPACERS Left 09/21/2018   Procedure: Resection of tibia versus both components with placement of antibiotic spacer;  Surgeon: Paralee Cancel, MD;  [facility];  Service: Orthopedics;  Laterality: Left;  2.5 hrs   EXCISIONAL TOTAL KNEE ARTHROPLASTY WITH ANTIBIOTIC SPACERS Left 01/02/2019   Procedure: Repeat Washout and placement of antibiotic spacer left knee;  Surgeon: Paralee Cancel, MD;  [facility];  Service: Orthopedics;  Laterality: Left;  2 hrs   HYDROCELECTOY   2012   I & D EXTREMITY Left 08/14/2019   Procedure: IRRIGATION AND DEBRIDEMENT LEFT KNEE WOUND;  Surgeon: Paralee Cancel, MD;  [facility];  Service: Orthopedics;  Laterality: Left;  60 mins   I & D KNEE WITH POLY EXCHANGE Left 05/17/2019   Procedure: IRRIGATION AND DEBRIDEMENT KNEE WITH POLY EXCHANGE;  Surgeon: Paralee Cancel, MD;  [facility];  Service: Orthopedics;  Laterality: Left;  90 mins   IRRIGATION AND DEBRIDEMENT KNEE Left 04/03/2019   Procedure: REIMPLANTATION OF LEFT KNEE TIBIAL COMPONENT.;  Surgeon: Paralee Cancel, MD;  [facility];  Service: Orthopedics;  Laterality: Left;  need 120 min   IRRIGATION AND DEBRIDEMENT KNEE Left 11/06/2019   Procedure: IRRIGATION AND DEBRIDEMENT KNEE;  Surgeon: Paralee Cancel, MD;  [facility];  Service:  Orthopedics;  Laterality: Left;  90 mins   LEFT KNEE ARTHROSCOPY  1999   LEFT KNEE SURGERY UPPER TIBIAL OSTOMY     LEFT TOTAL KNEE REMOVAL FOR INFECTION  2006   REIMPLANTATION LEFT TOTAL KNEE  2008   REIMPLANTATION LEFT TOTAL KNEE   2006   REIMPLANTATION OF TOTAL KNEE Left 06/23/2012   Procedure: REIMPLANTATION OF LEFT TOTAL KNEE;  Surgeon: Mauri Pole, MD;  [facility];  Service: Orthopedics;  Laterality: Left;   REMOVAL LEFT TOTAL KNEE   2008   REPLACEMENT LEFT KNEE  2002   REPLACEMENT RIGHT KNEE  2003   REVISION LEFT KNEE CAP   2004   RIGHT KNEE ARTHROSCOPY  1998   TEE WITHOUT CARDIOVERSION N/A 08/28/2012   Procedure: TRANSESOPHAGEAL ECHOCARDIOGRAM (TEE);  Surgeon: Sanda Klein, MD;  Location: [place];  Service: Cardiovascular;  Laterality: N/A;   TONSILLECTOMY     TOTAL KNEE REVISION Left 12/15/2015   Procedure: TOTAL KNEE REVISION REPLACEMENT;  Surgeon: Paralee Cancel, MD;  [facility];  Service: Orthopedics;  Laterality: Left;  Adductor Block    SocHx  reports that he has never smoked. He has never used smokeless tobacco. He reports that he does not currently use alcohol. He reports that he does not use drugs.  Allergies  Allergen Reactions   Morphine Hives    FamHx Family History  Problem Relation Age of Onset   Heart disease Father 12   Pancreatic cancer Sister    Liver cancer Sister    Cervical cancer Sister    Breast cancer Sister    Diabetes Sister    Colon cancer Neg Hx    Throat cancer Neg Hx    Stomach cancer Neg Hx    Kidney disease Neg Hx    Liver disease Neg Hx     Prior to Admission medications   Medication Sig Start Date End Date Taking? Authorizing Provider  diltiazem (CARDIZEM CD) 240 MG 24 hr capsule TAKE 1 CAPSULE BY MOUTH DAILY 10/10/18   Croitoru, Mihai, MD  diltiazem (TIAZAC) 240 MG 24 hr capsule Take 240 mg by mouth daily. 12/26/20   [provider]  ferrous sulfate (FERROUSUL) 325 (65 FE) MG tablet Take 1 tablet (325 mg total)  by mouth 3 (three) times daily with meals for 14 days. 08/16/19 10/30/20  Danae Orleans, PA-C  furosemide (LASIX) 20 MG tablet Take 20 mg by mouth daily. 07/28/16   [provider]  gabapentin (NEURONTIN) 800 MG tablet Take 800 mg by mouth 3 (three) times daily. 07/21/20   [provider]  levothyroxine (SYNTHROID) 125 MCG tablet Take 125 mcg by mouth daily before breakfast.  10/31/15   [provider]  methocarbamol (ROBAXIN) 500 MG tablet Take 1,000 mg by mouth 2 (two) times daily. 12/10/20   [provider]  metoprolol tartrate (LOPRESSOR) 50 MG tablet TAKE  1 TABLET BY MOUTH TWICE DAILY 12/12/18   Croitoru, Mihai, MD  Multiple Vitamin (MULTIVITAMIN WITH MINERALS) TABS Take 1 tablet by mouth daily.    [provider]  naphazoline-glycerin (CLEAR EYES REDNESS) 0.012-0.2 % SOLN Place 1-2 drops into both eyes 4 (four) times daily as needed for eye irritation. 11/21/19   Persons, Bevely Palmer, PA  potassium chloride (KLOR-CON M) 10 MEQ tablet Take 10 mEq by mouth daily. 01/06/21   [provider]  XARELTO 20 MG TABS tablet TAKE 1 TABLET BY MOUTH DAILY WITH BREAKFAST 10/08/19   Croitoru, Dani Gobble, MD    Physical Exam: Vitals:   01/15/21 1030 01/15/21 1115 01/15/21 1129 01/15/21 1130  BP: 101/60 95/64  107/68  Pulse: (!) 133 (!) 114  (!) 47  Resp: (!) 23 (!) 23  (!) 23  Temp:      SpO2: (!) 79% 90%  93%  Weight:   (!) 191.4 kg     General: 70 y.o. male resting in bed in NAD Eyes: PERRL, normal sclera ENMT: Nares patent w/o discharge, orophaynx clear, dentition normal, ears w/o discharge/lesions/ulcers Neck: Supple, trachea midline Cardiovascular: tachy, +S1, S2, no m/g/r, equal pulses throughout Respiratory: decreased at bases, slightly increased WOB on 4L [place] GI: BS+, obese, soft, NT, no masses noted, no organomegaly noted MSK: No c/c; LAKA, trace edema RLE Neuro: A&O x 3, no focal deficits but only intermittently giving effort in exam Psyc: slow  to interact, he is alert, but does not want to engage  Labs on Admission: I have personally reviewed following labs and imaging studies  CBC: Recent Labs  Lab 01/15/21 0945  WBC 6.6  NEUTROABS 5.3  HGB 10.1*  HCT 33.9*  MCV 102.7*  PLT 852   Basic Metabolic Panel: Recent Labs  Lab 01/15/21 0945  NA 138  K 4.3  CL 103  CO2 30  GLUCOSE 121*  BUN 19  CREATININE 1.10  CALCIUM 9.1   GFR: Estimated Creatinine Clearance: 105.4 mL/min (by C-G formula based on SCr of 1.1 mg/dL). Liver Function Tests: Recent Labs  Lab 01/15/21 0945  AST 18  ALT 14  ALKPHOS 67  BILITOT 0.8  PROT 7.0  ALBUMIN 3.1*   No results for input(s): LIPASE, AMYLASE in the last 168 hours. No results for input(s): AMMONIA in the last 168 hours. Coagulation Profile: No results for input(s): INR, PROTIME in the last 168 hours. Cardiac Enzymes: No results for input(s): CKTOTAL, CKMB, CKMBINDEX, TROPONINI in the last 168 hours. BNP (last 3 results) No results for input(s): PROBNP in the last 8760 hours. HbA1C: No results for input(s): HGBA1C in the last 72 hours. CBG: No results for input(s): GLUCAP in the last 168 hours. Lipid Profile: Recent Labs    01/15/21 0945  TRIG 31   Thyroid Function Tests: No results for input(s): TSH, T4TOTAL, FREET4, T3FREE, THYROIDAB in the last 72 hours. Anemia Panel: Recent Labs    01/15/21 0945  FERRITIN 92   Urine analysis:    Component Value Date/Time   COLORURINE AMBER (A) 08/15/2020 1313   APPEARANCEUR HAZY (A) 08/15/2020 1313   LABSPEC 1.023 08/15/2020 1313   PHURINE 5.0 08/15/2020 1313   GLUCOSEU NEGATIVE 08/15/2020 1313   HGBUR NEGATIVE 08/15/2020 1313   BILIRUBINUR NEGATIVE 08/15/2020 1313   KETONESUR NEGATIVE 08/15/2020 1313   PROTEINUR NEGATIVE 08/15/2020 1313   UROBILINOGEN 0.2 08/26/2012 1731   NITRITE NEGATIVE 08/15/2020 1313   LEUKOCYTESUR NEGATIVE 08/15/2020 1313    Radiological Exams on Admission: DG Chest  Port 1 View  Result  Date: 01/15/2021 CLINICAL DATA:  Shortness of breath EXAM: PORTABLE CHEST 1 VIEW COMPARISON:  08/14/2020 FINDINGS: Transverse diameter of heart is increased. Infiltrate is seen in the left lower lung fields. There is poor inspiration. Right lung is clear. There is no significant pleural effusion or pneumothorax. IMPRESSION: Cardiomegaly. Increased density in the left lower lung fields suggests atelectasis/pneumonia. Electronically Signed   By: Elmer Picker M.D.   On: 01/15/2021 09:37    EKG: Independently reviewed. afib w/o st elevations   Assessment/Plan COVID 19 PNA w/ possible superimposed bacterial PNA Sepsis secondary to above     - admit to inpt, tele     - continue remdes, add guaifenesin, steroids, nebs     - can continue current abx for now; rpt AM procal, if negative, d/c abx     - IS, flutter  Chronic HFpEF     - not in exacerbation     - continue home regimen  Hypothyroidism     - continue home regimen  Morbid obesity     - counsel on diet, lifestyle changes; follow up outpt  A fib on xarelto     - continue home regimen and anticoagulation  DVT prophylaxis: xarelto  Code Status: FULL  Family Communication: None at bedside  Consults called: None   Status is: Inpatient  Remains inpatient appropriate because: severity of illness  Jonnie Finner DO [facility]  If 7PM-7AM, please contact night-coverage [email or web address]  01/15/2021, 11:52 AM

## 2021-01-15 NOTE — ED Notes (Signed)
Pt placed on cardiac monitor 

## 2021-01-15 NOTE — ED Notes (Signed)
Pharmacy re-paged for Remdesivir

## 2021-01-15 NOTE — ED Provider Notes (Signed)
[facility] DEPT Provider Note   CSN: [id number] Arrival date & time: 01/15/21  0815     History Chief Complaint  Patient presents with   Shortness of Breath   Cough    [name] is a 70 y.o. male.  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   [facility]  Per EMS, saturations were ok on room air but was placed on non-rebreather given tachypnea and tachycardia.  Pt reports waking up yesterday with cough productive of clear sputum.  Did not know if he had fevers yesterday, but does have fevers today.  Reports congestion, sore throat.  Denies nausea, vomiting, diarrhea, significant body aches.  He reports he received his COVID booster 2 days ago.  Has fatigue, generalized weakness.  Has chronic pain in his legs that is not changed.  No urinary symptoms.  Past Medical History:  Diagnosis Date   CHF (congestive heart failure) ([place]) 05/2019   Chronic anticoagulation    with Xarelto   Complication of anesthesia    PT STATES HARD TO WAKE UP AFTER ONE SUGERY -STATES THE SURGERY TOOK LONGER THAN EXPECTED.  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   Shortness of breath    WITH EXERTION AND PAIN   Sleep apnea    uses CPAP,    Patient Active  Problem List   Diagnosis Date Noted   COVID-19 01/15/2021   Cellulitis of left leg    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[place])    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   Septic joint of left knee joint ([place]) 11/06/2019   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[place]) 08/15/2019   Open knee wound 08/14/2019   S/P left TK revision 04/03/2019   Administration of long-term prophylactic antibiotics    History of streptococcal infection    History of DVT (deep vein thrombosis)    S/P rev left TK 12/15/2015   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   Preoperative cardiovascular examination 04/17/2013   OSA on CPAP 11/27/2012   Chronic diastolic heart failure ([place]) 09/10/2012   Chronic anticoagulation, with Xarelto 09/10/2012   DVT (deep venous thrombosis), possible 09/10/2012   SOB (shortness of breath) 08/26/2012   Chest discomfort 08/26/2012   Persistent atrial fibrillation ([place]) 08/26/2012   Super obesity 06/27/2012   Expected blood loss anemia 06/27/2012   S/P left TK revision 06/23/2012   Septic arthritis of knee ([place])    Cellulitis 09/19/2010   METHICILLIN RESISTANT STAPHYLOCOCCUS AUREUS INFECTION 09/10/2009   STREPTOCOCCUS INFECTION CCE & UNS SITE GROUP C 07/04/2008   DM 07/04/2008   CHRONIC KIDNEY DISEASE UNSPECIFIED 07/04/2008   PULMONARY EMBOLISM, HX  OF 07/04/2008   INFECTION DUE TO INTERNAL ORTH DEVICE NEC 03/02/2006    Past Surgical History:  Procedure Laterality Date   2010 REMOVAL OF LEFT TOTAL KNEE     AMPUTATION Left 11/17/2019   Procedure: AMPUTATION ABOVE KNEE;  Surgeon: Newt Minion, MD;  Location: [facility];  Service: Orthopedics;  Laterality: Left;   CARDIAC CATHETERIZATION  2006   CARDIOVERSION N/A 08/28/2012   Procedure: CARDIOVERSION;  Surgeon: Sanda Klein, MD;  Location: [facility];  Service: Cardiovascular;  Laterality: N/A;   CARDIOVERSION N/A 09/01/2012   Procedure: CARDIOVERSION;  Surgeon: Pixie Casino, MD;  Location: [facility];  Service:  Cardiovascular;  Laterality: N/A;   COLONOSCOPY N/A 06/13/2013   Procedure: COLONOSCOPY;  Surgeon: Inda Castle, MD;  Location: [facility];  Service: Endoscopy;  Laterality: N/A;   EXCISIONAL TOTAL KNEE ARTHROPLASTY WITH ANTIBIOTIC SPACERS Left 09/21/2018   Procedure: Resection of tibia versus both components with placement of antibiotic spacer;  Surgeon: Paralee Cancel, MD;  Location: [facility];  Service: Orthopedics;  Laterality: Left;  2.5 hrs   EXCISIONAL TOTAL KNEE ARTHROPLASTY WITH ANTIBIOTIC SPACERS Left 01/02/2019   Procedure: Repeat Washout and placement of antibiotic spacer left knee;  Surgeon: Paralee Cancel, MD;  Location: [facility];  Service: Orthopedics;  Laterality: Left;  2 hrs   HYDROCELECTOY   2012   I & D EXTREMITY Left 08/14/2019   Procedure: IRRIGATION AND DEBRIDEMENT LEFT KNEE WOUND;  Surgeon: Paralee Cancel, MD;  Location: [facility];  Service: Orthopedics;  Laterality: Left;  60 mins   I & D KNEE WITH POLY EXCHANGE Left 05/17/2019   Procedure: IRRIGATION AND DEBRIDEMENT KNEE WITH POLY EXCHANGE;  Surgeon: Paralee Cancel, MD;  Location: [facility];  Service: Orthopedics;  Laterality: Left;  90 mins   IRRIGATION AND DEBRIDEMENT KNEE Left 04/03/2019   Procedure: REIMPLANTATION OF LEFT KNEE TIBIAL COMPONENT.;  Surgeon: Paralee Cancel, MD;  Location: [facility];  Service: Orthopedics;  Laterality: Left;  need 120 min   IRRIGATION AND DEBRIDEMENT KNEE Left 11/06/2019   Procedure: IRRIGATION AND DEBRIDEMENT KNEE;  Surgeon: Paralee Cancel, MD;  Location[facility];  Service: Orthopedics;  Laterality: Left;  90 mins   LEFT KNEE ARTHROSCOPY  1999   LEFT KNEE SURGERY UPPER TIBIAL OSTOMY     LEFT TOTAL KNEE REMOVAL FOR INFECTION  2006   REIMPLANTATION LEFT TOTAL KNEE  2008   REIMPLANTATION LEFT TOTAL KNEE   2006   REIMPLANTATION OF TOTAL KNEE Left 06/23/2012   Procedure: REIMPLANTATION OF LEFT TOTAL KNEE;  Surgeon: Mauri Pole, MD;  [facility];  Service: Orthopedics;  Laterality: Left;   REMOVAL LEFT  TOTAL KNEE   2008   REPLACEMENT LEFT KNEE  2002   REPLACEMENT RIGHT KNEE  2003   REVISION LEFT KNEE CAP   2004   RIGHT KNEE ARTHROSCOPY  1998   TEE WITHOUT CARDIOVERSION N/A 08/28/2012   Procedure: TRANSESOPHAGEAL ECHOCARDIOGRAM (TEE);  Surgeon: Sanda Klein, MD;  Location: [place];  Service: Cardiovascular;  Laterality: N/A;   TONSILLECTOMY     TOTAL KNEE REVISION Left 12/15/2015   Procedure: TOTAL KNEE REVISION REPLACEMENT;  Surgeon: Paralee Cancel, MD;  Location: [facility];  Service: Orthopedics;  Laterality: Left;  Adductor Block       Family History  Problem Relation Age of Onset   Heart disease Father 19   Pancreatic cancer Sister    Liver cancer Sister    Cervical cancer Sister    Breast cancer Sister  Diabetes Sister    Colon cancer Neg Hx    Throat cancer Neg Hx    Stomach cancer Neg Hx    Kidney disease Neg Hx    Liver disease Neg Hx     Social History   Tobacco Use   Smoking status: Never   Smokeless tobacco: Never  Vaping Use   Vaping Use: Never used  Substance Use Topics   Alcohol use: Not Currently    Comment: not since 1999. However, had around 6 drinks a day between 1993-1999, roughly.   Drug use: No    Home Medications Prior to Admission medications   Medication Sig Start Date End Date Taking? Authorizing Provider  acetaminophen (TYLENOL) 500 MG tablet Take 500 mg by mouth 3 (three) times daily as needed for mild pain or fever.   Yes [provider]  CAPSAICIN EX Apply 1 application topically 3 (three) times daily as needed (Phantom Limb Pain). 0.025%   Yes [provider]  diltiazem (CARDIZEM CD) 240 MG 24 hr capsule TAKE 1 CAPSULE BY MOUTH DAILY Patient taking differently: Take 240 mg by mouth daily. 10/10/18  Yes Croitoru, Mihai, MD  furosemide (LASIX) 20 MG tablet Take 20 mg by mouth daily. 07/28/16  Yes [provider]  gabapentin (NEURONTIN) 800 MG tablet Take 800 mg by mouth 3 (three) times daily. 07/21/20  Yes [provider]  levothyroxine (SYNTHROID) 125 MCG tablet Take 125 mcg by mouth daily before breakfast.  10/31/15  Yes [provider]  melatonin 5 MG TABS Take 10 mg by mouth at bedtime.   Yes [provider]  methocarbamol (ROBAXIN) 500 MG tablet Take 1,000 mg by mouth 4 (four) times daily as needed for muscle spasms. 12/10/20  Yes [provider]  metoprolol tartrate (LOPRESSOR) 50 MG tablet TAKE 1 TABLET BY MOUTH TWICE DAILY Patient taking differently: Take 50 mg by mouth 2 (two) times daily. 12/12/18  Yes Croitoru, Mihai, MD  Multiple Vitamin (MULTIVITAMIN WITH MINERALS) TABS Take 1 tablet by mouth daily.   Yes [provider]  naphazoline-glycerin (CLEAR EYES REDNESS) 0.012-0.2 % SOLN Place 1-2 drops into both eyes 4 (four) times daily as needed for eye irritation. 11/21/19  Yes Persons, Bevely Palmer, [place]  oxycodone (OXY-IR) 5 MG capsule Take 5 mg by mouth every 6 (six) hours as needed for pain.   Yes [provider]  potassium chloride (KLOR-CON M) 10 MEQ tablet Take 10 mEq by mouth daily. 01/06/21  Yes [provider]  vitamin B-12 (CYANOCOBALAMIN) 1000 MCG tablet Take 1,000 mcg by mouth daily.   Yes [provider]  XARELTO 20 MG TABS tablet TAKE 1 TABLET BY MOUTH DAILY WITH BREAKFAST Patient taking differently: Take 20 mg by mouth daily. 10/08/19  Yes Croitoru, Mihai, MD  ferrous sulfate (FERROUSUL) 325 (65 FE) MG tablet Take 1 tablet (325 mg total) by mouth 3 (three) times daily with meals for 14 days. Patient not taking: Reported on 01/15/2021 08/16/19 10/30/20  Danae Orleans, PA-C    Allergies    Morphine  Review of Systems   Review of Systems  Constitutional:  Positive for appetite change, fatigue and fever.  HENT:  Positive for congestion and sore throat.   Eyes:  Negative for visual disturbance.  Respiratory:  Positive for cough. Negative for shortness of breath.   Cardiovascular:  Negative for chest pain.   Gastrointestinal:  Negative for abdominal pain, diarrhea, nausea and vomiting.  Genitourinary:  Negative for difficulty urinating and dysuria.  Musculoskeletal:  Negative for back pain and neck stiffness.  Skin:  Negative for rash.  Neurological:  Negative for syncope and headaches.   Physical Exam Updated Vital Signs BP 124/89   Pulse 88   Temp 98.5 F (36.9 C) (Oral)   Resp 20   Wt (!) 191.4 kg Comment: Per Bariatric bed  SpO2 94%   BMI 64.16 kg/m   Physical Exam Vitals and nursing note reviewed.  Constitutional:      General: He is not in acute distress.    Appearance: He is well-developed. He is not diaphoretic.  HENT:     Head: Normocephalic and atraumatic.  Eyes:     Conjunctiva/sclera: Conjunctivae normal.  Cardiovascular:     Rate and Rhythm: Normal rate and regular rhythm.     Heart sounds: Normal heart sounds. No murmur heard.   No friction rub. No gallop.  Pulmonary:     Effort: Pulmonary effort is normal. No respiratory distress.     Breath sounds: Normal breath sounds. No wheezing or rales.  Abdominal:     General: There is no distension.     Palpations: Abdomen is soft.     Tenderness: There is no abdominal tenderness. There is no guarding.  Musculoskeletal:     Cervical back: Normal range of motion.  Skin:    General: Skin is warm and dry.  Neurological:     Mental Status: He is alert and oriented to person, place, and time.    ED Results / Procedures / Treatments   Labs (all labs ordered are listed, but only abnormal results are displayed) Labs Reviewed  RESP PANEL BY RT-PCR (FLU A&B, COVID) ARPGX2 - Abnormal; Notable for the following components:      Result Value   SARS Coronavirus 2 by RT PCR POSITIVE (*)    All other components within normal limits  CBC WITH DIFFERENTIAL/PLATELET - Abnormal; Notable for the following components:   RBC 3.30 (*)    Hemoglobin 10.1 (*)    HCT 33.9 (*)    MCV 102.7 (*)    MCHC 29.8 (*)    RDW 16.4 (*)     Lymphs Abs 0.6 (*)    All other components within normal limits  COMPREHENSIVE METABOLIC PANEL - Abnormal; Notable for the following components:   Glucose, Bld 121 (*)    Albumin 3.1 (*)    All other components within normal limits  FIBRINOGEN - Abnormal; Notable for the following components:   Fibrinogen 626 (*)    All other components within normal limits  C-REACTIVE PROTEIN - Abnormal; Notable for the following components:   CRP 12.4 (*)    All other components within normal limits  BRAIN NATRIURETIC PEPTIDE - Abnormal; Notable for the following components:   B Natriuretic Peptide 260.9 (*)    All other components within normal limits  CULTURE, BLOOD (ROUTINE X 2)  CULTURE, BLOOD (ROUTINE X 2)  LACTIC ACID, PLASMA  D-DIMER, QUANTITATIVE  PROCALCITONIN  LACTATE DEHYDROGENASE  FERRITIN  TRIGLYCERIDES  LACTIC ACID, PLASMA    EKG EKG Interpretation  Date/Time:  Thursday January 15 2021 08:34:36 EST Ventricular Rate:  109 PR Interval:    QRS Duration: 118 QT Interval:  330 QTC Calculation: 445 R Axis:   -50 Text Interpretation: Atrial fibrillation Ventricular premature complex Left anterior fascicular block Low voltage, precordial leads Nonspecific T abnormalities, lateral leads No significant change since last tracing Confirmed by Gareth Morgan [phone]) on 01/15/2021 4:55:07 PM  Radiology DG Chest [facility]  1 View  Result Date: 01/15/2021 CLINICAL DATA:  Shortness of breath EXAM: PORTABLE CHEST 1 VIEW COMPARISON:  08/14/2020 FINDINGS: Transverse diameter of heart is increased. Infiltrate is seen in the left lower lung fields. There is poor inspiration. Right lung is clear. There is no significant pleural effusion or pneumothorax. IMPRESSION: Cardiomegaly. Increased density in the left lower lung fields suggests atelectasis/pneumonia. Electronically Signed   By: Elmer Picker M.D.   On: 01/15/2021 09:37    Procedures .Critical Care Performed by: Gareth Morgan,  MD Authorized by: Gareth Morgan, MD   Critical care provider statement:    Critical care time (minutes):  30   Critical care was time spent personally by me on the following activities:  Development of treatment plan with patient or surrogate, evaluation of patient's response to treatment, examination of patient, ordering and review of laboratory studies, ordering and review of radiographic studies, ordering and performing treatments and interventions, pulse oximetry, re-evaluation of patient's condition and review of old charts   Medications Ordered in ED Medications  remdesivir 200 mg in sodium chloride 0.9% 250 mL IVPB (has no administration in time range)  remdesivir 100 mg in sodium chloride 0.9 % 100 mL IVPB (has no administration in time range)  Ipratropium-Albuterol (COMBIVENT) respimat 1 puff (has no administration in time range)  methylPREDNISolone sodium succinate (SOLU-MEDROL) 191.25 mg in sodium chloride 0.9 % 50 mL IVPB (0 mg Intravenous Stopped 01/15/21 1608)    Followed by  predniSONE (DELTASONE) tablet 50 mg (has no administration in time range)  guaiFENesin-dextromethorphan (ROBITUSSIN DM) 100-10 MG/5ML syrup 10 mL (has no administration in time range)  ascorbic acid (VITAMIN C) tablet 500 mg (500 mg Oral Given 01/15/21 1519)  zinc sulfate capsule 220 mg (220 mg Oral Given 01/15/21 1519)  acetaminophen (TYLENOL) tablet 650 mg (has no administration in time range)  ceFEPIme (MAXIPIME) 2 g in sodium chloride 0.9 % 100 mL IVPB (has no administration in time range)  vancomycin (VANCOREADY) IVPB 1250 mg/250 mL (has no administration in time range)  acetaminophen (TYLENOL) tablet 1,000 mg (1,000 mg Oral Given 01/15/21 0944)  ceFEPIme (MAXIPIME) 2 g in sodium chloride 0.9 % 100 mL IVPB (0 g Intravenous Stopped 01/15/21 1322)  vancomycin (VANCOREADY) IVPB 2000 mg/400 mL (0 mg Intravenous Stopped 01/15/21 1518)    ED Course  I have reviewed the triage vital signs and the nursing  notes.  Pertinent labs & imaging results that were available during my care of the patient were reviewed by me and considered in my medical decision making (see chart for details).    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  Arrives to ED febrile and on nonrebreather, transitioned to 4L [place] with oxygen saturations around 90%.   Labs ordered per COVID 19 protocol. Given hypoxia, ordered remdesivir per pharmacy consult.  XR with possible infiltrates.  We will also cover for bacterial pneumonia with vancomycin and cefepime. Will admit for continued care.    Final Clinical Impression(s) / ED Diagnoses Final diagnoses:  [id number]  Hypoxia    Rx / DC Orders ED Discharge Orders  None        Gareth Morgan, MD 01/15/21 1657

## 2021-01-15 NOTE — ED Triage Notes (Signed)
EMS reports from [facility], called out for Covid positive and SOB and cough since yesterday. Pt received Covid booster yesterday during day.  BP  HR 120 RR 22 Sp02 90 RA (98 on rebreather @ 10)

## 2021-01-15 NOTE — Progress Notes (Signed)
Pt demonstrated good effort, understanding and demonstration of Flutter Valve. Device provided for use.

## 2021-01-16 DIAGNOSIS — I4819 Other persistent atrial fibrillation: Secondary | ICD-10-CM

## 2021-01-16 DIAGNOSIS — Z6841 Body Mass Index (BMI) 40.0 and over, adult: Secondary | ICD-10-CM

## 2021-01-16 DIAGNOSIS — E039 Hypothyroidism, unspecified: Secondary | ICD-10-CM

## 2021-01-16 DIAGNOSIS — D539 Nutritional anemia, unspecified: Secondary | ICD-10-CM

## 2021-01-16 LAB — CBC WITH DIFFERENTIAL/PLATELET
Abs Immature Granulocytes: 0.02 10*3/uL (ref 0.00–0.07)
Basophils Absolute: 0 10*3/uL (ref 0.0–0.1)
Basophils Relative: 0 %
Eosinophils Absolute: 0 10*3/uL (ref 0.0–0.5)
Eosinophils Relative: 0 %
HCT: 31.9 % — ABNORMAL LOW (ref 39.0–52.0)
Hemoglobin: 9.5 g/dL — ABNORMAL LOW (ref 13.0–17.0)
Immature Granulocytes: 0 %
Lymphocytes Relative: 9 %
Lymphs Abs: 0.4 10*3/uL — ABNORMAL LOW (ref 0.7–4.0)
MCH: 30.7 pg (ref 26.0–34.0)
MCHC: 29.8 g/dL — ABNORMAL LOW (ref 30.0–36.0)
MCV: 103.2 fL — ABNORMAL HIGH (ref 80.0–100.0)
Monocytes Absolute: 0.1 10*3/uL (ref 0.1–1.0)
Monocytes Relative: 2 %
Neutro Abs: 4.2 10*3/uL (ref 1.7–7.7)
Neutrophils Relative %: 89 %
Platelets: 180 10*3/uL (ref 150–400)
RBC: 3.09 MIL/uL — ABNORMAL LOW (ref 4.22–5.81)
RDW: 15.8 % — ABNORMAL HIGH (ref 11.5–15.5)
WBC: 4.7 10*3/uL (ref 4.0–10.5)
nRBC: 0 % (ref 0.0–0.2)

## 2021-01-16 LAB — EXPECTORATED SPUTUM ASSESSMENT W GRAM STAIN, RFLX TO RESP C

## 2021-01-16 LAB — COMPREHENSIVE METABOLIC PANEL
ALT: 13 U/L (ref 0–44)
AST: 17 U/L (ref 15–41)
Albumin: 2.9 g/dL — ABNORMAL LOW (ref 3.5–5.0)
Alkaline Phosphatase: 58 U/L (ref 38–126)
Anion gap: 6 (ref 5–15)
BUN: 18 mg/dL (ref 8–23)
CO2: 27 mmol/L (ref 22–32)
Calcium: 9.2 mg/dL (ref 8.9–10.3)
Chloride: 104 mmol/L (ref 98–111)
Creatinine, Ser: 0.92 mg/dL (ref 0.61–1.24)
GFR, Estimated: >60 mL/min (ref >60)
Glucose, Bld: 148 mg/dL — ABNORMAL HIGH (ref 70–99)
Potassium: 4.6 mmol/L (ref 3.5–5.1)
Sodium: 137 mmol/L (ref 135–145)
Total Bilirubin: 0.5 mg/dL (ref 0.3–1.2)
Total Protein: 6.9 g/dL (ref 6.5–8.1)

## 2021-01-16 LAB — FERRITIN: Ferritin: 104 ng/mL (ref 24–336)

## 2021-01-16 LAB — C-REACTIVE PROTEIN: CRP: 21.8 mg/dL — ABNORMAL HIGH (ref <1.0)

## 2021-01-16 LAB — LACTATE DEHYDROGENASE: LDH: 139 U/L (ref 98–192)

## 2021-01-16 LAB — PROCALCITONIN: Procalcitonin: 0.24 ng/mL

## 2021-01-16 LAB — D-DIMER, QUANTITATIVE: D-Dimer, Quant: 0.39 ug/mL-FEU (ref 0.00–0.50)

## 2021-01-16 LAB — MRSA NEXT GEN BY PCR, NASAL: MRSA by PCR Next Gen: NOT DETECTED

## 2021-01-16 MED ORDER — SODIUM CHLORIDE 0.9 % IV SOLN
100.0000 mg | Freq: Every day | INTRAVENOUS | Status: AC
  Administered 2021-01-17 – 2021-01-20 (×4): 100 mg via INTRAVENOUS
  Filled 2021-01-16 (×4): qty 20

## 2021-01-16 MED ORDER — ENSURE MAX PROTEIN PO LIQD
11.0000 [oz_av] | Freq: Two times a day (BID) | ORAL | Status: DC
  Administered 2021-01-16 – 2021-01-20 (×8): 11 [oz_av] via ORAL
  Filled 2021-01-16 (×10): qty 330

## 2021-01-16 MED ORDER — SODIUM CHLORIDE 0.9 % IV SOLN
100.0000 mg | Freq: Once | INTRAVENOUS | Status: AC
  Administered 2021-01-16: 100 mg via INTRAVENOUS
  Filled 2021-01-16: qty 20

## 2021-01-16 MED ORDER — RIVAROXABAN 20 MG PO TABS
20.0000 mg | ORAL_TABLET | Freq: Every day | ORAL | Status: DC
  Administered 2021-01-17 – 2021-01-20 (×4): 20 mg via ORAL
  Filled 2021-01-16 (×4): qty 1

## 2021-01-16 MED ORDER — CHLORHEXIDINE GLUCONATE CLOTH 2 % EX PADS
6.0000 | MEDICATED_PAD | Freq: Every day | CUTANEOUS | Status: DC
  Administered 2021-01-17 – 2021-01-20 (×5): 6 via TOPICAL

## 2021-01-16 MED ORDER — AZITHROMYCIN 250 MG PO TABS
500.0000 mg | ORAL_TABLET | Freq: Every day | ORAL | Status: AC
  Administered 2021-01-16 – 2021-01-20 (×5): 500 mg via ORAL
  Filled 2021-01-16 (×5): qty 2

## 2021-01-16 MED ORDER — DILTIAZEM HCL ER COATED BEADS 240 MG PO CP24
240.0000 mg | ORAL_CAPSULE | Freq: Every day | ORAL | Status: DC
  Administered 2021-01-16 – 2021-01-20 (×5): 240 mg via ORAL
  Filled 2021-01-16: qty 2
  Filled 2021-01-16 (×2): qty 1
  Filled 2021-01-16: qty 2
  Filled 2021-01-16 (×2): qty 1

## 2021-01-16 MED ORDER — SODIUM CHLORIDE 0.9 % IV SOLN
100.0000 mg | Freq: Every day | INTRAVENOUS | Status: DC
  Administered 2021-01-16: 100 mg via INTRAVENOUS
  Filled 2021-01-16: qty 20

## 2021-01-16 MED ORDER — CHLORHEXIDINE GLUCONATE 0.12 % MT SOLN
15.0000 mL | Freq: Two times a day (BID) | OROMUCOSAL | Status: DC
  Administered 2021-01-16 – 2021-01-20 (×8): 15 mL via OROMUCOSAL
  Filled 2021-01-16 (×9): qty 15

## 2021-01-16 MED ORDER — LEVOTHYROXINE SODIUM 25 MCG PO TABS
125.0000 ug | ORAL_TABLET | Freq: Every day | ORAL | Status: DC
  Administered 2021-01-16 – 2021-01-20 (×5): 125 ug via ORAL
  Filled 2021-01-16 (×5): qty 1

## 2021-01-16 MED ORDER — SODIUM CHLORIDE 0.9 % IV SOLN
1.0000 g | INTRAVENOUS | Status: AC
  Administered 2021-01-16 – 2021-01-19 (×4): 1 g via INTRAVENOUS
  Filled 2021-01-16 (×4): qty 10

## 2021-01-16 MED ORDER — ORAL CARE MOUTH RINSE
15.0000 mL | Freq: Two times a day (BID) | OROMUCOSAL | Status: DC
  Administered 2021-01-16 – 2021-01-20 (×6): 15 mL via OROMUCOSAL

## 2021-01-16 MED ORDER — PROSOURCE PLUS PO LIQD
30.0000 mL | Freq: Two times a day (BID) | ORAL | Status: DC
Start: 2021-01-16 — End: 2021-01-21
  Administered 2021-01-16 – 2021-01-20 (×10): 30 mL via ORAL
  Filled 2021-01-16 (×10): qty 30

## 2021-01-16 MED ORDER — FUROSEMIDE 10 MG/ML IJ SOLN
20.0000 mg | Freq: Every day | INTRAMUSCULAR | Status: DC
  Administered 2021-01-16 – 2021-01-20 (×5): 20 mg via INTRAVENOUS
  Filled 2021-01-16 (×5): qty 2

## 2021-01-16 MED ORDER — METOPROLOL TARTRATE 50 MG PO TABS
50.0000 mg | ORAL_TABLET | Freq: Two times a day (BID) | ORAL | Status: DC
  Administered 2021-01-16 – 2021-01-20 (×10): 50 mg via ORAL
  Filled 2021-01-16: qty 2
  Filled 2021-01-16 (×3): qty 1
  Filled 2021-01-16 (×2): qty 2
  Filled 2021-01-16 (×2): qty 1
  Filled 2021-01-16: qty 2
  Filled 2021-01-16: qty 1

## 2021-01-16 MED ORDER — ENOXAPARIN SODIUM 300 MG/3ML IJ SOLN
1.0000 mg/kg | Freq: Two times a day (BID) | INTRAMUSCULAR | Status: AC
  Administered 2021-01-16 (×2): 190 mg via SUBCUTANEOUS
  Filled 2021-01-16 (×2): qty 1.9

## 2021-01-16 MED ORDER — RIVAROXABAN 20 MG PO TABS: 20.0000 mg | ORAL_TABLET | Freq: Every day | ORAL | Status: DC

## 2021-01-16 MED ORDER — ADULT MULTIVITAMIN W/MINERALS CH
1.0000 | ORAL_TABLET | Freq: Every day | ORAL | Status: DC
  Administered 2021-01-16 – 2021-01-20 (×5): 1 via ORAL
  Filled 2021-01-16 (×5): qty 1

## 2021-01-16 MED ORDER — SODIUM CHLORIDE 0.9 % IV SOLN
1.0000 mg/kg | Freq: Two times a day (BID) | INTRAVENOUS | Status: DC
  Administered 2021-01-16 – 2021-01-18 (×4): 191.25 mg via INTRAVENOUS
  Filled 2021-01-16 (×5): qty 3.06

## 2021-01-16 NOTE — Hospital Course (Signed)
[name] is a 70 y.o. male with medical history significant of CHF, a fib on xarelto, hypothyroidism, morbid obesity.  Patient was initially a poor historian on admission.  He was developing worsening dyspnea and cough at his SNF prior to admission. He underwent work-up in the ER was found to be positive for COVID-19.  He was also found to be hypoxic requiring nonrebreather. He was started on remdesivir, steroids, and admitted for further continuation of oxygen and weaning as able.

## 2021-01-16 NOTE — Assessment & Plan Note (Signed)
-   Complicates overall prognosis and care - Body mass index is 64.16 kg/m.

## 2021-01-16 NOTE — Progress Notes (Signed)
Progress Note    [name]   ZOX:[id number]  DOB: [birth year]  DOA: 01/15/2021     1 PCP: Bernerd Limbo, MD  Initial CC: Shortness of breath  Hospital Course: [name] is a 70 y.o. male with medical history significant of CHF, a fib on xarelto, hypothyroidism, morbid obesity.  Patient was initially a poor historian on admission.  He was developing worsening dyspnea and cough at his SNF prior to admission. He underwent work-up in the ER was found to be positive for COVID-19.  He was also found to be hypoxic requiring nonrebreather. He was started on remdesivir, steroids, and admitted for further continuation of oxygen and weaning as able.  Interval History:  Seen this morning resting in bed with BiPAP in place.  He was awake, alert, and able to carry on conversation easily and answer questions.  We removed his BiPAP with respiratory therapy in the room and transitioned him to [place] high flow.  He was coughing up some blood-tinged sputum.  Assessment & Plan: * Pneumonia due to COVID-19 virus - At risk for progression of disease with underlying comorbidities of CHF, A. fib, morbid obesity - Continue remdesivir, steroids, oxygen - Continue trending inflammatory markers -If does worsen clinically, will consider Tocilizumab as well -PCT borderline but does have some appearance of left lower lobe infiltrate.  We will continue antibiotics for now but lower suspicion for MDRO, so will d/c vanc and cefepime and use rocephin/azithro  - follow up sputum culture   Persistent atrial fibrillation (HCC) - Continue diltiazem and Xarelto - Hold metoprolol for now, will resume as able  Macrocytic anemia - Follow-up folate and B12  OSA on CPAP - Patient stated to me that he has not been using CPAP at home for quite a while - Required BiPAP on admission - See [id number]  Chronic diastolic heart failure ([place]) - Does not appear to be volume overloaded or in exacerbation.  Lower  extremity edema is baseline - Continue Lasix - Metoprolol on hold for now, resuming as able  Morbid obesity with BMI of 60.0-69.9, adult ([place]) - Complicates overall prognosis and care - Body mass index is 64.16 kg/m.  Hypothyroidism - Continue Synthroid   Old records reviewed in assessment of this patient  Antimicrobials: Cefepime 01/15/2021 >> 01/16/2021 Vancomycin 01/15/2021 >> 01/16/2021 Remdesivir 01/16/21 >> current Rocephin 01/16/21 >> current  Azithro 01/16/21 >> current  DVT prophylaxis: Lovenox then transition to Xarelto  Code Status:   Code Status: Full Code  Disposition Plan:   Status is: Inpatient  Objective: Blood pressure 118/60, pulse (!) 42, temperature 98.2 F (36.8 C), temperature source Oral, resp. rate 15, weight (!) 191.4 kg, SpO2 95 %.  Examination:  Physical Exam Constitutional:      General: He is not in acute distress.    Appearance: Normal appearance. He is obese.     Comments: Pleasant adult gentleman lying in bed with BiPAP in place but in no distress  HENT:     Head: Normocephalic and atraumatic.     Mouth/Throat:     Mouth: Mucous membranes are moist.  Eyes:     Extraocular Movements: Extraocular movements intact.  Cardiovascular:     Rate and Rhythm: Tachycardia present. Rhythm irregular.     Heart sounds: Normal heart sounds.  Pulmonary:     Effort: Pulmonary effort is normal.     Comments: Distant but coarse breath sounds bilateral anterior lung fields Abdominal:     General: Bowel sounds are  normal. There is no distension.     Palpations: Abdomen is soft.     Tenderness: There is no abdominal tenderness.  Musculoskeletal:     Cervical back: Normal range of motion and neck supple.     Right lower leg: Edema present.     Comments: Chronic 3+ right lower extremity edema at baseline per patient  Skin:    General: Skin is warm and dry.  Neurological:     Mental Status: He is alert and oriented to person, place, and time.   Psychiatric:        Mood and Affect: Mood normal.        Behavior: Behavior normal.     Consultants:    Procedures:    Data Reviewed: I have personally reviewed labs and imaging studies    LOS: 1 day  Time spent: Greater than 50% of the 35 minute visit was spent in counseling/coordination of care for the patient as laid out in the A&P.   Dwyane Dee, MD [facility] 01/16/2021, 12:20 PM

## 2021-01-16 NOTE — Assessment & Plan Note (Signed)
-   Patient stated to me that he has not been using CPAP at home for quite a while - Required BiPAP on admission - See COVID-19

## 2021-01-16 NOTE — Assessment & Plan Note (Addendum)
-   Does not appear to be volume overloaded or in exacerbation.  Lower extremity edema is baseline - Continue Lasix - Continue metoprolol

## 2021-01-16 NOTE — Progress Notes (Incomplete)
Pt transitioned from BIPAP to nasal cannula.  Currently on 6L [place], tolerating well.

## 2021-01-16 NOTE — Plan of Care (Signed)

## 2021-01-16 NOTE — Progress Notes (Signed)
Received pt on 4L [place]. Pt began to desat to low 80s. Increased oxygen to 6L [place], pt sating 92%.  RT called and came to put pt on his CPAP with 6L.  Pt began to desat to low 80s, placed call to RT again and came to adjust CPAP settings. Pt maintaining 91%. NP notified of increased oxygen requirements. Rapid Response RN notified. Pt began desating to low 80s, RN switched pt to NRB 15L. NP notified, placed call to RT. Order placed for patient transfer to higher level of care.

## 2021-01-16 NOTE — Progress Notes (Signed)
Initial Nutrition Assessment  DOCUMENTATION CODES:   Morbid obesity  INTERVENTION:  - will order Ensure Max BID, each supplement provides 150 kcal and 30 grams of protein. - will order 30 ml Prosource Plus BID, each supplement provides 100 kcal and 15 grams protein.  - will order 1 tablet multivitamin with minerals/day.    NUTRITION DIAGNOSIS:   Increased nutrient needs related to acute illness, catabolic illness as evidenced by estimated needs.  GOAL:   Patient will meet greater than or equal to 90% of their needs  MONITOR:   PO intake, Supplement acceptance, Labs, Weight trends  REASON FOR ASSESSMENT:   Malnutrition Screening Tool  ASSESSMENT:   70 y.o. male with medical history of CHF, a fib on xarelto, hypothyroidism, morbid obesity, GERD, and sleep apnea. He was noted to be a poor historian on admission. He presented from SNF due to worsening dyspnea and cough. In the ED he was found to be COVID-19 positive and was hypoxic requiring NRB mask.  Diet advanced from NPO to Heart Healthy yesterday at 1356 with no meal completion percentages documented in the flow sheet.   He has not been seen by a [place] RD since 08/2012.  Weight yesterday was documented as 422 lb which is significantly up from the other two weights documented during 2022: 394 lb on 08/23/20 and 374 lb on 04/24/20.   Labs reviewed. Medications reviewed; 500 mg ascorbic acid/day, 20 mg IV lasix/day, 125 mcg oral synthroid/day, 191 mg solu-medrol BID, 100 mg IV remdesivir x1 dose/day (12/9-12/12), 220 mg zinc sulfate/day.     NUTRITION - FOCUSED PHYSICAL EXAM:  Unable to complete at this time.  Diet Order:   Diet Order             Diet Heart Room service appropriate? Yes; Fluid consistency: Thin  Diet effective now                   EDUCATION NEEDS:   No education needs have been identified at this time  Skin:  Skin Assessment: Reviewed RN Assessment  Last BM:  PTA/unknown  Height:    Ht Readings from Last 1 Encounters:  08/14/20 5\' 8"  (1.727 m)    Weight:   Wt Readings from Last 1 Encounters:  01/15/21 (!) 191.4 kg     Estimated Nutritional Needs:  Kcal:  2100-2400 kcal Protein:  105-120 grams Fluid:  >/= 2.3 L/day      Jarome Matin, MS, RD, LDN, CNSC Inpatient Clinical Dietitian RD pager # available in [facility]  After hours/weekend pager # available in [facility]

## 2021-01-16 NOTE — Assessment & Plan Note (Addendum)
-   Follow-up folate (9.8) and B12 (1257)

## 2021-01-16 NOTE — Assessment & Plan Note (Addendum)
-   At risk for progression of disease with underlying comorbidities of CHF, A. fib, morbid obesity - Continue remdesivir, steroids, oxygen - Continue trending inflammatory markers -If does worsen clinically, will consider Tocilizumab as well -PCT borderline but does have some appearance of left lower lobe infiltrate.  We will continue antibiotics for now but lower suspicion for MDRO, so will d/c vanc and cefepime and use rocephin/azithro  - follow up sputum culture: Moderate normal flora

## 2021-01-16 NOTE — Assessment & Plan Note (Signed)
Continue Synthroid °

## 2021-01-16 NOTE — Progress Notes (Addendum)
    OVERNIGHT PROGRESS REPORT   HPI; Mr. [name] is a 70 year old male with medical history of CHF, A.-Fib, (on Xarelto), hypothyroidism, morbid obesity.  In the ER he was on nonrebreather weaned to 4 L nasal cannula with a fever of 102.36F found to be COVID-positive with concern for pneumonia.   During this course tonight he has required additional oxygen during which his CPAP was inadequate to maintain oxygenation and he was put on BiPAP.  He is transferred to progressive status (accommodated in SDU/ICU.)  Current vital signs are heart rate 106 - SPO2 99% on BiPAP (This is immediately after activity of moving)     Gershon Cull MSNA MSN [place]

## 2021-01-16 NOTE — Assessment & Plan Note (Addendum)
-   Continue diltiazem and Xarelto - continue metoprolol

## 2021-01-17 LAB — CBC WITH DIFFERENTIAL/PLATELET
Abs Immature Granulocytes: 0.03 10*3/uL (ref 0.00–0.07)
Basophils Absolute: 0 10*3/uL (ref 0.0–0.1)
Basophils Relative: 0 %
Eosinophils Absolute: 0 10*3/uL (ref 0.0–0.5)
Eosinophils Relative: 0 %
HCT: 32.4 % — ABNORMAL LOW (ref 39.0–52.0)
Hemoglobin: 9.8 g/dL — ABNORMAL LOW (ref 13.0–17.0)
Immature Granulocytes: 1 %
Lymphocytes Relative: 10 %
Lymphs Abs: 0.5 10*3/uL — ABNORMAL LOW (ref 0.7–4.0)
MCH: 29.9 pg (ref 26.0–34.0)
MCHC: 30.2 g/dL (ref 30.0–36.0)
MCV: 98.8 fL (ref 80.0–100.0)
Monocytes Absolute: 0.2 10*3/uL (ref 0.1–1.0)
Monocytes Relative: 5 %
Neutro Abs: 4.4 10*3/uL (ref 1.7–7.7)
Neutrophils Relative %: 84 %
Platelets: 196 10*3/uL (ref 150–400)
RBC: 3.28 MIL/uL — ABNORMAL LOW (ref 4.22–5.81)
RDW: 15.4 % (ref 11.5–15.5)
WBC: 5.2 10*3/uL (ref 4.0–10.5)
nRBC: 0 % (ref 0.0–0.2)

## 2021-01-17 LAB — COMPREHENSIVE METABOLIC PANEL
ALT: 14 U/L (ref 0–44)
AST: 15 U/L (ref 15–41)
Albumin: 2.9 g/dL — ABNORMAL LOW (ref 3.5–5.0)
Alkaline Phosphatase: 59 U/L (ref 38–126)
Anion gap: 7 (ref 5–15)
BUN: 30 mg/dL — ABNORMAL HIGH (ref 8–23)
CO2: 29 mmol/L (ref 22–32)
Calcium: 9.4 mg/dL (ref 8.9–10.3)
Chloride: 103 mmol/L (ref 98–111)
Creatinine, Ser: 0.91 mg/dL (ref 0.61–1.24)
GFR, Estimated: >60 mL/min (ref >60)
Glucose, Bld: 157 mg/dL — ABNORMAL HIGH (ref 70–99)
Potassium: 4.3 mmol/L (ref 3.5–5.1)
Sodium: 139 mmol/L (ref 135–145)
Total Bilirubin: 0.4 mg/dL (ref 0.3–1.2)
Total Protein: 7 g/dL (ref 6.5–8.1)

## 2021-01-17 LAB — VITAMIN B12: Vitamin B-12: 1257 pg/mL — ABNORMAL HIGH (ref 180–914)

## 2021-01-17 LAB — D-DIMER, QUANTITATIVE: D-Dimer, Quant: <0.27 ug/mL-FEU (ref 0.00–0.50)

## 2021-01-17 LAB — FOLATE: Folate: 9.8 ng/mL (ref >5.9)

## 2021-01-17 LAB — LACTATE DEHYDROGENASE: LDH: 130 U/L (ref 98–192)

## 2021-01-17 LAB — MAGNESIUM: Magnesium: 2.2 mg/dL (ref 1.7–2.4)

## 2021-01-17 LAB — FERRITIN: Ferritin: 147 ng/mL (ref 24–336)

## 2021-01-17 LAB — C-REACTIVE PROTEIN: CRP: 17.5 mg/dL — ABNORMAL HIGH (ref <1.0)

## 2021-01-17 NOTE — Progress Notes (Signed)
Pt transferred to 1410. Placed back on 6lpm [place] HFNC BIPAP in room asked pt if he will be wearing machine tonight states "he's not sure it all depends on if it blows in his eyes or not"

## 2021-01-17 NOTE — Progress Notes (Signed)
Progress Note    [name]   IRC:[id number]  DOB: 1950-10-25  DOA: 01/15/2021     2 PCP: Bernerd Limbo, MD  Initial CC: Shortness of breath  Hospital Course: [name] is a 70 y.o. male with medical history significant of CHF, a fib on xarelto, hypothyroidism, morbid obesity.  Patient was initially a poor historian on admission.  He was developing worsening dyspnea and cough at his SNF prior to admission. He underwent work-up in the ER was found to be positive for COVID-19.  He was also found to be hypoxic requiring nonrebreather. He was started on remdesivir, steroids, and admitted for further continuation of oxygen and weaning as able.  Interval History:  No events overnight.  He is currently on 6 L oxygen this morning.  Breathing is also improved he states.  Appetite also is improving.  Assessment & Plan: * Pneumonia due to COVID-19 virus - At risk for progression of disease with underlying comorbidities of CHF, A. fib, morbid obesity - Continue remdesivir, steroids, oxygen - Continue trending inflammatory markers -If does worsen clinically, will consider Tocilizumab as well -PCT borderline but does have some appearance of left lower lobe infiltrate.  We will continue antibiotics for now but lower suspicion for MDRO, so will d/c vanc and cefepime and use rocephin/azithro  - follow up sputum culture   Persistent atrial fibrillation (HCC) - Continue diltiazem and Xarelto - continue metoprolol  Macrocytic anemia - Follow-up folate (9.8) and B12 (1257)  OSA on CPAP - Patient stated to me that he has not been using CPAP at home for quite a while - Required BiPAP on admission - See [id number]  Chronic diastolic heart failure ([place]) - Does not appear to be volume overloaded or in exacerbation.  Lower extremity edema is baseline - Continue Lasix - Metoprolol on hold for now, resuming as able  Morbid obesity with BMI of 60.0-69.9, adult ([place]) - Complicates  overall prognosis and care - Body mass index is 64.16 kg/m.  Hypothyroidism - Continue Synthroid    Old records reviewed in assessment of this patient  Antimicrobials: Cefepime 01/15/2021 >> 01/16/2021 Vancomycin 01/15/2021 >> 01/16/2021 Remdesivir 01/16/21 >> current Rocephin 01/16/21 >> current  Azithro 01/16/21 >> current  DVT prophylaxis: Xarelto  Code Status:   Code Status: Full Code  Disposition Plan:   Status is: Inpatient  Objective: Blood pressure 131/72, pulse 81, temperature 98.6 F (37 C), temperature source Axillary, resp. rate (!) 22, weight (!) 191.4 kg, SpO2 92 %.  Examination:  Physical Exam Constitutional:      General: He is not in acute distress.    Appearance: Normal appearance. He is obese.     Comments: Pleasant adult gentleman lying in bed with BiPAP in place but in no distress  HENT:     Head: Normocephalic and atraumatic.     Mouth/Throat:     Mouth: Mucous membranes are moist.  Eyes:     Extraocular Movements: Extraocular movements intact.  Cardiovascular:     Rate and Rhythm: Normal rate. Rhythm irregular.     Heart sounds: Normal heart sounds.  Pulmonary:     Effort: Pulmonary effort is normal.     Comments: Distant but coarse breath sounds bilateral anterior lung fields Abdominal:     General: Bowel sounds are normal. There is no distension.     Palpations: Abdomen is soft.     Tenderness: There is no abdominal tenderness.  Musculoskeletal:     Cervical back: Normal range  of motion and neck supple.     Right lower leg: Edema present.     Comments: Chronic 3+ right lower extremity edema at baseline per patient  Skin:    General: Skin is warm and dry.  Neurological:     Mental Status: He is alert and oriented to person, place, and time.  Psychiatric:        Mood and Affect: Mood normal.        Behavior: Behavior normal.     Consultants:    Procedures:    Data Reviewed: I have personally reviewed labs and imaging studies     LOS: 2 days  Time spent: Greater than 50% of the 35 minute visit was spent in counseling/coordination of care for the patient as laid out in the A&P.   Dwyane Dee, MD [facility] 01/17/2021, 3:43 PM

## 2021-01-17 NOTE — Progress Notes (Signed)
RT inquired again about bipap. Pt states he doesn't like the air blowing on his face and eyes. I told him I can adjust the mask to see if that helps and told him the those exhalation ports need to remain open on the mask to get rid of co2. Pt said he doesn't want to wear it. No resp distress noted. Machine remain in the room standby.

## 2021-01-17 NOTE — Progress Notes (Signed)
Physical Therapy Treatment Patient Details Name: [name] MRN: [id number] DOB: [birth year] Today's Date: 01/17/2021   History of Present Illness Pt admitted from [facility] ALF 2* SOB and dx with Covid PNA.  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    PT Comments    Pt admitted as above and presenting with functional mobility limitations 2* generalized weakness, presence of L AKA; and obesity.  This date, pt up to sit on EOB and then stood multiple times with use of RW and assist. Pt very motivated and should progress to dc back to prior ALF.   Recommendations for follow up therapy are one component of a multi-disciplinary discharge planning process, led by the attending physician.  Recommendations may be updated based on patient status, additional functional criteria and insurance authorization.  Follow Up Recommendations  Home health PT (at ALF)     Assistance Recommended at Discharge Intermittent Supervision/Assistance  Equipment Recommendations  None recommended by PT    Recommendations for Other Services       Precautions / Restrictions Precautions Precautions: Fall Restrictions Weight Bearing Restrictions: No     Mobility  Bed Mobility Overal bed mobility: Needs Assistance Bed Mobility: Supine to Sit     Supine to sit: Min assist;Mod assist;+2 for physical assistance;+2 for safety/equipment     General bed mobility comments: Use of bedrail and assist to bring trunk to upright sitting on air bed    Transfers Overall transfer level: Needs assistance Equipment used: Rolling walker (2 wheels) Transfers: Sit to/from Stand Sit to Stand: Min assist;Mod assist;+2 safety/equipment;From elevated surface           General transfer comment: assist to bring wt up and fwd and to stabilize in standing.  Pt stood x 2    Ambulation/Gait               General Gait Details: Pt stood at bedside x 2 with  RW and assist ~ 90 seconds total.  Pt then did multiple partial sit to stands to scoot up to top of bed   Stairs             Wheelchair Mobility    Modified Rankin (Stroke Patients Only)       Balance Overall balance assessment: Needs assistance Sitting-balance support: No upper extremity supported;Feet supported Sitting balance-Leahy Scale: Good     Standing balance support: Bilateral upper extremity supported Standing balance-Leahy Scale: Poor                              Cognition Arousal/Alertness: Awake/alert Behavior During Therapy: WFL for tasks assessed/performed Overall Cognitive Status: Within Functional Limits for tasks assessed                                          Exercises      General Comments        Pertinent Vitals/Pain Pain Assessment: No/denies pain    Home Living Family/patient expects to be discharged to:: Assisted living                 Home Equipment: Rollator (4 wheels);Rolling Walker (2 wheels);Wheelchair - manual;Cane - single point;Grab bars - toilet      Prior Function  PT Goals (current goals can now be found in the care plan section) Acute Rehab PT Goals Patient Stated Goal: Regain IND PT Goal Formulation: With patient Time For Goal Achievement: 01/31/21 Potential to Achieve Goals: Fair    Frequency    Min 3X/week      PT Plan      Co-evaluation              AM-PAC PT "6 Clicks" Mobility   Outcome Measure  Help needed turning from your back to your side while in a flat bed without using bedrails?: A Lot Help needed moving from lying on your back to sitting on the side of a flat bed without using bedrails?: A Lot Help needed moving to and from a bed to a chair (including a wheelchair)?: A Lot Help needed standing up from a chair using your arms (e.g., wheelchair or bedside chair)?: A Lot Help needed to walk in hospital room?: Total Help needed climbing  3-5 steps with a railing? : Total 6 Click Score: 10    End of Session Equipment Utilized During Treatment: Gait belt Activity Tolerance: Patient tolerated treatment well Patient left: in bed;with call bell/phone within reach;with nursing/sitter in room Nurse Communication: Mobility status PT Visit Diagnosis: Difficulty in walking, not elsewhere classified (R26.2);Muscle weakness (generalized) (M62.81)     Time: 9767-3419 PT Time Calculation (min) (ACUTE ONLY): 41 min  Charges:  $Therapeutic Activity: 8-22 mins                     [place] Pager [phone] Office [phone]    Avalynne Diver 01/17/2021, 4:59 PM

## 2021-01-17 NOTE — Plan of Care (Signed)
°  Problem: Coping: °Goal: Level of anxiety will decrease °Outcome: Progressing °  °

## 2021-01-18 DIAGNOSIS — I5032 Chronic diastolic (congestive) heart failure: Secondary | ICD-10-CM

## 2021-01-18 DIAGNOSIS — Z6841 Body Mass Index (BMI) 40.0 and over, adult: Secondary | ICD-10-CM

## 2021-01-18 LAB — CBC WITH DIFFERENTIAL/PLATELET
Abs Immature Granulocytes: 0.02 10*3/uL (ref 0.00–0.07)
Basophils Absolute: 0 10*3/uL (ref 0.0–0.1)
Basophils Relative: 0 %
Eosinophils Absolute: 0 10*3/uL (ref 0.0–0.5)
Eosinophils Relative: 0 %
HCT: 33.9 % — ABNORMAL LOW (ref 39.0–52.0)
Hemoglobin: 10.3 g/dL — ABNORMAL LOW (ref 13.0–17.0)
Immature Granulocytes: 1 %
Lymphocytes Relative: 12 %
Lymphs Abs: 0.5 10*3/uL — ABNORMAL LOW (ref 0.7–4.0)
MCH: 29.9 pg (ref 26.0–34.0)
MCHC: 30.4 g/dL (ref 30.0–36.0)
MCV: 98.5 fL (ref 80.0–100.0)
Monocytes Absolute: 0.1 10*3/uL (ref 0.1–1.0)
Monocytes Relative: 3 %
Neutro Abs: 3.4 10*3/uL (ref 1.7–7.7)
Neutrophils Relative %: 84 %
Platelets: 207 10*3/uL (ref 150–400)
RBC: 3.44 MIL/uL — ABNORMAL LOW (ref 4.22–5.81)
RDW: 16 % — ABNORMAL HIGH (ref 11.5–15.5)
WBC: 4.1 10*3/uL (ref 4.0–10.5)
nRBC: 0 % (ref 0.0–0.2)

## 2021-01-18 LAB — COMPREHENSIVE METABOLIC PANEL
ALT: 14 U/L (ref 0–44)
AST: 15 U/L (ref 15–41)
Albumin: 2.9 g/dL — ABNORMAL LOW (ref 3.5–5.0)
Alkaline Phosphatase: 57 U/L (ref 38–126)
Anion gap: 7 (ref 5–15)
BUN: 38 mg/dL — ABNORMAL HIGH (ref 8–23)
CO2: 31 mmol/L (ref 22–32)
Calcium: 9.4 mg/dL (ref 8.9–10.3)
Chloride: 103 mmol/L (ref 98–111)
Creatinine, Ser: 0.79 mg/dL (ref 0.61–1.24)
GFR, Estimated: >60 mL/min (ref >60)
Glucose, Bld: 165 mg/dL — ABNORMAL HIGH (ref 70–99)
Potassium: 3.5 mmol/L (ref 3.5–5.1)
Sodium: 141 mmol/L (ref 135–145)
Total Bilirubin: 0.5 mg/dL (ref 0.3–1.2)
Total Protein: 7 g/dL (ref 6.5–8.1)

## 2021-01-18 LAB — D-DIMER, QUANTITATIVE: D-Dimer, Quant: <0.27 ug/mL-FEU (ref 0.00–0.50)

## 2021-01-18 LAB — C-REACTIVE PROTEIN: CRP: 9 mg/dL — ABNORMAL HIGH (ref <1.0)

## 2021-01-18 LAB — CULTURE, RESPIRATORY W GRAM STAIN: Culture: NORMAL

## 2021-01-18 LAB — MAGNESIUM: Magnesium: 2.2 mg/dL (ref 1.7–2.4)

## 2021-01-18 LAB — LACTATE DEHYDROGENASE: LDH: 127 U/L (ref 98–192)

## 2021-01-18 LAB — FERRITIN: Ferritin: 136 ng/mL (ref 24–336)

## 2021-01-18 MED ORDER — SODIUM CHLORIDE 0.9 % IV SOLN
200.0000 mg | Freq: Two times a day (BID) | INTRAVENOUS | Status: DC
  Administered 2021-01-18 – 2021-01-20 (×5): 200 mg via INTRAVENOUS
  Filled 2021-01-18 (×6): qty 3.2

## 2021-01-18 NOTE — Evaluation (Addendum)
Occupational Therapy Evaluation Patient Details Name: [name] MRN: [id number] DOB: Jun 30, 1950 Today's Date: 01/18/2021   History of Present Illness Pt admitted from [facility] ALF 2* SOB and dx with Covid PNA.  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   Clinical Impression   Patient is a 70 year old male who was admitted for above. Patient was living at ALF with independence at w/c level. Patient was noted to have decreased activity tolerance, decreased endurance, and increased need for supplemental O2 impacting participation in ADLs. Patient was on 4L HFNC with O2 dropping to 86% with increased activity with patient able to deep breath O2 saturation back up within 45 seconds with education on breathing strategies.  Patient would continue to benefit from skilled OT services at this time while admitted and after d/c to address noted deficits in order to improve overall safety and independence in ADLs.  Bariatric recliner was ordered from [employer]. None available at this time. [employer] to deliver when one becomes available.      Recommendations for follow up therapy are one component of a multi-disciplinary discharge planning process, led by the attending physician.  Recommendations may be updated based on patient status, additional functional criteria and insurance authorization.   Follow Up Recommendations  Skilled nursing-short term rehab (<3 hours/day)    Assistance Recommended at Discharge Intermittent Supervision/Assistance  Functional Status Assessment  Patient has had a recent decline in their functional status and demonstrates the ability to make significant improvements in function in a reasonable and predictable amount of time.  Equipment Recommendations  None recommended by OT    Recommendations for Other Services       Precautions / Restrictions Precautions Precautions: Fall Precaution Comments: L  AKA Restrictions Weight Bearing Restrictions: No      Mobility Bed Mobility Overal bed mobility: Needs Assistance Bed Mobility: Supine to Sit     Supine to sit: Min assist;Mod assist;+2 for physical assistance;+2 for safety/equipment     General bed mobility comments: patient was able to roll to each side with mod A with +2 present for safety.    Transfers                          Balance Overall balance assessment: Needs assistance Sitting-balance support: No upper extremity supported;Feet supported Sitting balance-Leahy Scale: Good                                     ADL either performed or assessed with clinical judgement   ADL Overall ADL's : Needs assistance/impaired Eating/Feeding: Set up;Sitting   Grooming: Oral care;Wash/dry face;Sitting;Set up Grooming Details (indicate cue type and reason): patient declined to complete oral care sitting on EOB Upper Body Bathing: Set up;Sitting Upper Body Bathing Details (indicate cue type and reason): EOB Lower Body Bathing: Minimal assistance;Sitting/lateral leans Lower Body Bathing Details (indicate cue type and reason): on EOB Upper Body Dressing : Minimal assistance;Sitting   Lower Body Dressing: Sitting/lateral leans;Bed level;Moderate assistance   Toilet Transfer: +2 for physical assistance;+2 for safety/equipment   Toileting- Clothing Manipulation and Hygiene: Maximal assistance;Bed level       Functional mobility during ADLs: +2 for safety/equipment;+2 for physical assistance       Vision Patient Visual Report: No change from baseline  Perception     Praxis      Pertinent Vitals/Pain Pain Assessment: No/denies pain     Hand Dominance Right   Extremity/Trunk Assessment Upper Extremity Assessment Upper Extremity Assessment: Overall WFL for tasks assessed   Lower Extremity Assessment Lower Extremity Assessment: Defer to PT evaluation LLE Deficits / Details: L AKA    Cervical / Trunk Assessment Cervical / Trunk Assessment: Normal   Communication Communication Communication: No difficulties   Cognition Arousal/Alertness: Awake/alert Behavior During Therapy: WFL for tasks assessed/performed Overall Cognitive Status: Within Functional Limits for tasks assessed                                       General Comments       Exercises     Shoulder Instructions      Home Living Family/patient expects to be discharged to:: Assisted living                             Home Equipment: Rollator (4 wheels);Rolling Walker (2 wheels);Wheelchair - manual;Cane - single point;Grab bars - toilet   Additional Comments: pt reports  he "bird baths" at sink and uses a BSC      Prior Functioning/Environment Prior Level of Function : Independent/Modified Independent             Mobility Comments: Pt reports Mod I with transfer to/from WC, bed, comode but not ambulatory at this time ADLs Comments: patient was able to complete birth baths, toileting and dressing tasks MI at wheelchair level.        OT Problem List: Decreased activity tolerance;Impaired balance (sitting and/or standing);Decreased safety awareness;Obesity      OT Treatment/Interventions: Self-care/ADL training;Therapeutic exercise;Neuromuscular education;Energy conservation;Therapeutic activities;DME and/or AE instruction;Balance training;Patient/family education    OT Goals(Current goals can be found in the care plan section) Acute Rehab OT Goals Patient Stated Goal: to get back to ALF OT Goal Formulation: With patient Time For Goal Achievement: 02/01/21 Potential to Achieve Goals: Good  OT Frequency: Min 2X/week   Barriers to D/C:            Co-evaluation              AM-PAC OT "6 Clicks" Daily Activity     Outcome Measure Help from another person eating meals?: A Little Help from another person taking care of personal grooming?: A  Little Help from another person toileting, which includes using toliet, bedpan, or urinal?: A Lot Help from another person bathing (including washing, rinsing, drying)?: A Lot Help from another person to put on and taking off regular upper body clothing?: A Lot Help from another person to put on and taking off regular lower body clothing?: A Lot 6 Click Score: 14   End of Session Nurse Communication: Mobility status  Activity Tolerance: Patient tolerated treatment well Patient left: in bed;with call bell/phone within reach;with nursing/sitter in room  OT Visit Diagnosis: Muscle weakness (generalized) (M62.81);Unsteadiness on feet (R26.81)                Time: 3785-8850 OT Time Calculation (min): 41 min Charges:  OT General Charges $OT Visit: 1 Visit OT Evaluation $OT Eval Low Complexity: 1 Low OT Treatments $Self Care/Home Management : 23-37 mins  Jackelyn Poling OTR/L, MS Acute Rehabilitation Department Office# [phone] Pager# [phone]   Marcellina Millin 01/18/2021, 3:56 PM

## 2021-01-18 NOTE — Progress Notes (Signed)
Pt refused the bipap again tonight. He said he is not consistent with his home machine either. Encouraged him to wear it as much as he can. Pt states he is fine with the O2. No resp distress noted. RN aware. Machine remain in the room standby.

## 2021-01-18 NOTE — Progress Notes (Signed)
Progress Note    Bond Grieshop   PPI:[id number]  DOB: 11-04-1950  DOA: 01/15/2021     3 PCP: Bernerd Limbo, MD  Initial CC: Shortness of breath  Hospital Course: [name] is a 70 y.o. male with medical history significant of CHF, a fib on xarelto, hypothyroidism, morbid obesity.  Patient was initially a poor historian on admission.  He was developing worsening dyspnea and cough at his SNF prior to admission. He underwent work-up in the ER was found to be positive for COVID-19.  He was also found to be hypoxic requiring nonrebreather. He was started on remdesivir, steroids, and admitted for further continuation of oxygen and weaning as able.  Interval History:  No events overnight.  Oxygen weaning well.  I turned him down to 4 L this morning.  He was 95% on this. His mentation is normal, breathing easily, and his cough has improved and now turning more clear.  Assessment & Plan: * Pneumonia due to COVID-19 virus - At risk for progression of disease with underlying comorbidities of CHF, A. fib, morbid obesity - Continue remdesivir, steroids, oxygen - Continue trending inflammatory markers -If does worsen clinically, will consider Tocilizumab as well -PCT borderline but does have some appearance of left lower lobe infiltrate.  We will continue antibiotics for now but lower suspicion for MDRO, so will d/c vanc and cefepime and use rocephin/azithro  - follow up sputum culture: Moderate normal flora  Persistent atrial fibrillation (HCC) - Continue diltiazem and Xarelto - continue metoprolol  Macrocytic anemia - Follow-up folate (9.8) and B12 (1257)  OSA on CPAP - Patient stated to me that he has not been using CPAP at home for quite a while - Required BiPAP on admission - See [id number]  Chronic diastolic heart failure ([place]) - Does not appear to be volume overloaded or in exacerbation.  Lower extremity edema is baseline - Continue Lasix - Continue  metoprolol  Morbid obesity with BMI of 60.0-69.9, adult (HCC) - Complicates overall prognosis and care - Body mass index is 64.16 kg/m.  Hypothyroidism - Continue Synthroid    Old records reviewed in assessment of this patient  Antimicrobials: Cefepime 01/15/2021 >> 01/16/2021 Vancomycin 01/15/2021 >> 01/16/2021 Remdesivir 01/16/21 >> current Rocephin 01/16/21 >> current  Azithro 01/16/21 >> current  DVT prophylaxis: Xarelto  Code Status:   Code Status: Full Code  Disposition Plan:   Status is: Inpatient  Objective: Blood pressure 139/82, pulse 87, temperature 99.6 F (37.6 C), temperature source Oral, resp. rate 20, height 5\' 8"  (1.727 m), weight (!) 199.6 kg, SpO2 (!) 89 %.  Examination:  Physical Exam Constitutional:      General: He is not in acute distress.    Appearance: Normal appearance. He is obese.     Comments: Pleasant adult gentleman lying in bed with BiPAP in place but in no distress  HENT:     Head: Normocephalic and atraumatic.     Mouth/Throat:     Mouth: Mucous membranes are moist.  Eyes:     Extraocular Movements: Extraocular movements intact.  Cardiovascular:     Rate and Rhythm: Normal rate. Rhythm irregular.     Heart sounds: Normal heart sounds.  Pulmonary:     Effort: Pulmonary effort is normal.     Comments: Distant but coarse breath sounds bilateral anterior lung fields Abdominal:     General: Bowel sounds are normal. There is no distension.     Palpations: Abdomen is soft.     Tenderness:  There is no abdominal tenderness.  Musculoskeletal:     Cervical back: Normal range of motion and neck supple.     Right lower leg: Edema present.     Comments: Chronic 3+ right lower extremity edema at baseline per patient  Skin:    General: Skin is warm and dry.  Neurological:     Mental Status: He is alert and oriented to person, place, and time.  Psychiatric:        Mood and Affect: Mood normal.        Behavior: Behavior normal.      Consultants:    Procedures:    Data Reviewed: I have personally reviewed labs and imaging studies    LOS: 3 days  Time spent: Greater than 50% of the 35 minute visit was spent in counseling/coordination of care for the patient as laid out in the A&P.   Dwyane Dee, MD [facility] 01/18/2021, 1:07 PM

## 2021-01-19 DIAGNOSIS — J9602 Acute respiratory failure with hypercapnia: Secondary | ICD-10-CM

## 2021-01-19 DIAGNOSIS — J9601 Acute respiratory failure with hypoxia: Secondary | ICD-10-CM

## 2021-01-19 LAB — CBC WITH DIFFERENTIAL/PLATELET
Abs Immature Granulocytes: 0.04 10*3/uL (ref 0.00–0.07)
Basophils Absolute: 0 10*3/uL (ref 0.0–0.1)
Basophils Relative: 0 %
Eosinophils Absolute: 0 10*3/uL (ref 0.0–0.5)
Eosinophils Relative: 0 %
HCT: 34.5 % — ABNORMAL LOW (ref 39.0–52.0)
Hemoglobin: 10.4 g/dL — ABNORMAL LOW (ref 13.0–17.0)
Immature Granulocytes: 1 %
Lymphocytes Relative: 14 %
Lymphs Abs: 0.5 10*3/uL — ABNORMAL LOW (ref 0.7–4.0)
MCH: 30 pg (ref 26.0–34.0)
MCHC: 30.1 g/dL (ref 30.0–36.0)
MCV: 99.4 fL (ref 80.0–100.0)
Monocytes Absolute: 0.1 10*3/uL (ref 0.1–1.0)
Monocytes Relative: 2 %
Neutro Abs: 3 10*3/uL (ref 1.7–7.7)
Neutrophils Relative %: 83 %
Platelets: 198 10*3/uL (ref 150–400)
RBC: 3.47 MIL/uL — ABNORMAL LOW (ref 4.22–5.81)
RDW: 15.8 % — ABNORMAL HIGH (ref 11.5–15.5)
WBC: 3.6 10*3/uL — ABNORMAL LOW (ref 4.0–10.5)
nRBC: 1.1 % — ABNORMAL HIGH (ref 0.0–0.2)

## 2021-01-19 LAB — COMPREHENSIVE METABOLIC PANEL
ALT: 20 U/L (ref 0–44)
AST: 21 U/L (ref 15–41)
Albumin: 2.7 g/dL — ABNORMAL LOW (ref 3.5–5.0)
Alkaline Phosphatase: 48 U/L (ref 38–126)
Anion gap: 8 (ref 5–15)
BUN: 39 mg/dL — ABNORMAL HIGH (ref 8–23)
CO2: 30 mmol/L (ref 22–32)
Calcium: 9.1 mg/dL (ref 8.9–10.3)
Chloride: 102 mmol/L (ref 98–111)
Creatinine, Ser: 0.76 mg/dL (ref 0.61–1.24)
GFR, Estimated: >60 mL/min (ref >60)
Glucose, Bld: 164 mg/dL — ABNORMAL HIGH (ref 70–99)
Potassium: 3.5 mmol/L (ref 3.5–5.1)
Sodium: 140 mmol/L (ref 135–145)
Total Bilirubin: 0.7 mg/dL (ref 0.3–1.2)
Total Protein: 6.5 g/dL (ref 6.5–8.1)

## 2021-01-19 LAB — FERRITIN: Ferritin: 113 ng/mL (ref 24–336)

## 2021-01-19 LAB — LACTATE DEHYDROGENASE: LDH: 128 U/L (ref 98–192)

## 2021-01-19 LAB — MAGNESIUM: Magnesium: 2.2 mg/dL (ref 1.7–2.4)

## 2021-01-19 LAB — D-DIMER, QUANTITATIVE: D-Dimer, Quant: <0.27 ug/mL-FEU (ref 0.00–0.50)

## 2021-01-19 LAB — C-REACTIVE PROTEIN: CRP: 4.9 mg/dL — ABNORMAL HIGH (ref <1.0)

## 2021-01-19 NOTE — TOC Initial Note (Signed)
Transition of Care [facility]) - Initial/Assessment Note    Patient Details  Name: [name] MRN: [id number] Date of Birth: 15-Feb-1950  Transition of Care [facility]) CM/SW Contact:    Dessa Phi, RN Phone Number: 01/19/2021, 12:07 PM  Clinical Narrative:  Damaris Schooner to [facility] liason-[facility]remy-aware of return tomorrow.Already covid+-can return with no additional requirements. HHPT faxed order, & fl2 fax#[phone].                  Expected Discharge Plan: Assisted Living Barriers to Discharge: Continued Medical Work up   Patient Goals and CMS Choice Patient states their goals for this hospitalization and ongoing recovery are:: return back to [place] CMS Medicare.gov Compare Post Acute Care list provided to:: Patient Choice offered to / list presented to : Patient  Expected Discharge Plan and Services Expected Discharge Plan: Assisted Living   Discharge Planning Services: CM Consult Post Acute Care Choice: [place] arrangements for the past 2 months: [place]: PT [facility] Agency: Other - See comment (Facility has own contracted HHPT)        Prior Living Arrangements/Services Living arrangements for the past 2 months: [place] Lives with:: Facility Resident Patient language and need for interpreter reviewed:: Yes Do you feel safe going back to the place where you live?: Yes      Need for Family Participation in Patient Care: No (Comment) Care giver support system in place?: Yes (comment) Current home services: DME (w/c) Criminal Activity/Legal Involvement Pertinent to Current Situation/Hospitalization: No - Comment as needed  Activities of Daily Living Home Assistive Devices/Equipment: Eyeglasses ADL Screening (condition at time of admission) Patient's cognitive ability adequate to safely complete daily activities?: Yes Is the patient deaf or have difficulty hearing?: No Does  the patient have difficulty seeing, even when wearing glasses/contacts?: No Does the patient have difficulty concentrating, remembering, or making decisions?: No Patient able to express need for assistance with ADLs?: Yes Does the patient have difficulty dressing or bathing?: Yes Independently performs ADLs?: No Communication: Independent Dressing (OT): Needs assistance Is this a change from baseline?: Change from baseline, expected to last >3 days Grooming: Independent Feeding: Independent Bathing: Needs assistance Is this a change from baseline?: Change from baseline, expected to last >3 days Toileting: Needs assistance Is this a change from baseline?: Change from baseline, expected to last >3days In/Out Bed: Needs assistance Is this a change from baseline?: Change from baseline, expected to last >3 days Walks in Home: Needs assistance Is this a change from baseline?: Change from baseline, expected to last >3 days Does the patient have difficulty walking or climbing stairs?: Yes Weakness of Legs: Both Weakness of Arms/Hands: Both  Permission Sought/Granted Permission sought to share information with : Case Manager Permission granted to share information with : Yes, Verbal Permission Granted  Share Information with NAME: Case Manager           Emotional Assessment Appearance:: Appears stated age Attitude/Demeanor/Rapport: Gracious Affect (typically observed): Accepting Orientation: : Oriented to Self, Oriented to Place, Oriented to  Time, Oriented to Situation Alcohol / Substance Use: Not Applicable Psych Involvement: No (comment)  Admission diagnosis:  Hypoxia [R09.02] COVID-19 [U07.1] Patient Active Problem List   Diagnosis Date Noted   Hypothyroidism    Macrocytic anemia    Morbid obesity with BMI of 60.0-69.9,  adult [facility])    Pneumonia due to COVID-19 virus 01/15/2021   Cellulitis of left leg    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[place])    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/08/2019   Septic joint of left knee joint ([place]) 11/06/2019   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   Open knee wound 08/14/2019   S/P left TK revision 04/03/2019   Administration of long-term prophylactic antibiotics    History of streptococcal infection    History of DVT (deep vein thrombosis)    S/P rev left TK 12/15/2015   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   Preoperative cardiovascular examination 04/17/2013   OSA on CPAP 11/27/2012   Chronic diastolic heart failure ([place]) 09/10/2012   Chronic anticoagulation, with Xarelto 09/10/2012   DVT (deep venous thrombosis), possible 09/10/2012   SOB (shortness of breath) 08/26/2012   Chest discomfort 08/26/2012   Persistent atrial fibrillation ([place]) 08/26/2012   Super obesity 06/27/2012   Expected blood loss anemia 06/27/2012   S/P left TK revision 06/23/2012   Septic arthritis of knee ([place])    Cellulitis 09/19/2010   METHICILLIN RESISTANT STAPHYLOCOCCUS AUREUS INFECTION 09/10/2009   STREPTOCOCCUS INFECTION CCE & UNS SITE GROUP C 07/04/2008   DM 07/04/2008   CHRONIC KIDNEY DISEASE UNSPECIFIED 07/04/2008   PULMONARY EMBOLISM, HX OF 07/04/2008   INFECTION DUE TO INTERNAL ORTH DEVICE NEC 03/02/2006   PCP:  Bernerd Limbo, MD Pharmacy:   [facility], [address] [place] [address] Phone: [phone] Fax: [phone]  [facility] [address] [place] [address] Phone: [phone] Fax: [phone]     Social Determinants of Health (SDOH) Interventions    Readmission Risk Interventions Readmission Risk Prevention Plan 11/09/2019  Transportation Screening Complete  PCP or Specialist Appt within 5-7 Days Complete  Home Care Screening Complete  Medication Review (RN CM) Complete  Some recent data might be hidden

## 2021-01-19 NOTE — Progress Notes (Signed)
Physical Therapy Treatment Patient Details Name: [name] MRN: [id number] DOB: October 22, 1950 Today's Date: 01/19/2021   History of Present Illness Pt admitted from [facility] 2* SOB and dx with Covid PNA.  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    PT Comments    General Comments: AxO x 3 very pleasant and very memorable from multiple  prior admits Assisted to EOB required increased time.  General bed mobility comments: only required Mod Assist for upper body otherwise self able to manipulate bed using controls for head up/down and use of rails to slide and roll. General transfer comment: assist to bring wt up and fwd and to stabilize in standing.  Pt stood x 2 approx 60 seconds each before c/o fatigue. Assisted back to supine.   Pt plans to return to [employer].   Recommendations for follow up therapy are one component of a multi-disciplinary discharge planning process, led by the attending physician.  Recommendations may be updated based on patient status, additional functional criteria and insurance authorization.  Follow Up Recommendations  Home health PT     Assistance Recommended at Discharge Intermittent Supervision/Assistance  Equipment Recommendations  None recommended by PT    Recommendations for Other Services       Precautions / Restrictions Precautions Precautions: Fall Precaution Comments: L AKA 11/17/19 he does not wear his leg (too heavy) so wc is mobility     Mobility  Bed Mobility Overal bed mobility: Needs Assistance Bed Mobility: Supine to Sit;Sit to Supine     Supine to sit: Mod assist     General bed mobility comments: only required Mod Assist for upper body otherwise self able to manipulate bed using controls for head up/down and use of rails to slide and roll.    Transfers Overall transfer level: Needs assistance Equipment used: Rolling walker (2 wheels) Judie Petit) Transfers:  Sit to/from Stand Sit to Stand: Min assist;Mod assist;+2 safety/equipment;From elevated surface           General transfer comment: assist to bring wt up and fwd and to stabilize in standing.  Pt stood x 2 approx 60 seconds each before c/o fatigue.    Ambulation/Gait               General Gait Details: did NOT attempt amb/transfers only   Stairs             Wheelchair Mobility    Modified Rankin (Stroke Patients Only)       Balance                                            Cognition Arousal/Alertness: Awake/alert Behavior During Therapy: WFL for tasks assessed/performed Overall Cognitive Status: Within Functional Limits for tasks assessed                                 General Comments: AxO x 3 very pleasant and very memorable from multiple  prior admits        Exercises      General Comments        Pertinent Vitals/Pain Pain Assessment: No/denies pain    Home Living  Prior Function            PT Goals (current goals can now be found in the care plan section) Progress towards PT goals: Progressing toward goals    Frequency    Min 3X/week      PT Plan Current plan remains appropriate    Co-evaluation              AM-PAC PT "6 Clicks" Mobility   Outcome Measure  Help needed turning from your back to your side while in a flat bed without using bedrails?: A Lot Help needed moving from lying on your back to sitting on the side of a flat bed without using bedrails?: A Lot Help needed moving to and from a bed to a chair (including a wheelchair)?: A Lot Help needed standing up from a chair using your arms (e.g., wheelchair or bedside chair)?: A Lot Help needed to walk in hospital room?: Total Help needed climbing 3-5 steps with a railing? : Total 6 Click Score: 10    End of Session Equipment Utilized During Treatment: Gait belt Activity Tolerance: Patient  tolerated treatment well Patient left: in bed;with call bell/phone within reach;with nursing/sitter in room Nurse Communication: Mobility status PT Visit Diagnosis: Difficulty in walking, not elsewhere classified (R26.2);Muscle weakness (generalized) (M62.81)     Time: 8338-2505 PT Time Calculation (min) (ACUTE ONLY): 24 min  Charges:  $Therapeutic Exercise: 23-37 mins                     Rica Koyanagi  PTA Acute  Rehabilitation Services Pager      [phone] Office      [phone]

## 2021-01-19 NOTE — Progress Notes (Signed)
Progress Note    [name]   XTG:[id number]  DOB: July 10, 1950  DOA: 01/15/2021     4 PCP: Bernerd Limbo, MD  Initial CC: Shortness of breath  Hospital Course: [name] is a 70 y.o. male with medical history significant of CHF, a fib on xarelto, hypothyroidism, morbid obesity.  Patient was initially a poor historian on admission.  He was developing worsening dyspnea and cough at his SNF prior to admission. He underwent work-up in the ER was found to be positive for COVID-19.  He was also found to be hypoxic requiring nonrebreather. He was started on remdesivir, steroids, and admitted for further continuation of oxygen and weaning as able.  Interval History:  No events overnight. Still comfortable in bed and breathing is comfortable.   Assessment & Plan: * Pneumonia due to COVID-19 virus - At risk for progression of disease with underlying comorbidities of CHF, A. fib, morbid obesity - Continue remdesivir, steroids, oxygen - Continue trending inflammatory markers -If does worsen clinically, will consider Tocilizumab as well -PCT borderline but does have some appearance of left lower lobe infiltrate.  We will continue antibiotics for now but lower suspicion for MDRO, so will d/c vanc and cefepime and use rocephin/azithro  - follow up sputum culture: Moderate normal flora  Acute respiratory failure with hypoxia (HCC) - Oxygen has been able to be weaned slowly during hospitalization.  Currently down to 2 L.  Unable to perform walk test given immobility - Arranging for home oxygen if able at time of discharge - given his underlying OHS/OSA, he may have a chronic component as well and possibly require long term O2  Persistent atrial fibrillation (HCC) - Continue diltiazem and Xarelto - continue metoprolol  Macrocytic anemia - Follow-up folate (9.8) and B12 (1257)  OSA on CPAP - Patient stated to me that he has not been using CPAP at home for quite a while -  Required BiPAP on admission - See [id number]  Chronic diastolic heart failure ([place]) - Does not appear to be volume overloaded or in exacerbation.  Lower extremity edema is baseline - Continue Lasix - Continue metoprolol  Morbid obesity with BMI of 60.0-69.9, adult (HCC) - Complicates overall prognosis and care - Body mass index is 64.16 kg/m.  Hypothyroidism - Continue Synthroid    Old records reviewed in assessment of this patient  Antimicrobials: Cefepime 01/15/2021 >> 01/16/2021 Vancomycin 01/15/2021 >> 01/16/2021 Remdesivir 01/16/21 >> current Rocephin 01/16/21 >> current  Azithro 01/16/21 >> current  DVT prophylaxis: Xarelto  Code Status:   Code Status: Full Code  Disposition Plan:   Status is: Inpatient  Objective: Blood pressure (!) 146/90, pulse 90, temperature 99.3 F (37.4 C), temperature source Oral, resp. rate 20, height 5\' 8"  (1.727 m), weight (!) 199.6 kg, SpO2 94 %.  Examination:  Physical Exam Constitutional:      General: He is not in acute distress.    Appearance: Normal appearance. He is obese.     Comments: Pleasant adult gentleman lying in bed with BiPAP in place but in no distress  HENT:     Head: Normocephalic and atraumatic.     Mouth/Throat:     Mouth: Mucous membranes are moist.  Eyes:     Extraocular Movements: Extraocular movements intact.  Cardiovascular:     Rate and Rhythm: Normal rate.     Heart sounds: Normal heart sounds.  Pulmonary:     Effort: Pulmonary effort is normal.     Comments: Distant but coarse  breath sounds bilateral anterior lung fields Abdominal:     General: Bowel sounds are normal. There is no distension.     Palpations: Abdomen is soft.     Tenderness: There is no abdominal tenderness.  Musculoskeletal:     Cervical back: Normal range of motion and neck supple.     Right lower leg: Edema present.     Comments: Chronic 3+ right lower extremity edema at baseline per patient  Skin:    General: Skin is warm and dry.      Comments: Chronic RLE skin changes  Neurological:     Mental Status: He is alert and oriented to person, place, and time.  Psychiatric:        Mood and Affect: Mood normal.        Behavior: Behavior normal.     Consultants:    Procedures:    Data Reviewed: I have personally reviewed labs and imaging studies    LOS: 4 days  Time spent: Greater than 50% of the 35 minute visit was spent in counseling/coordination of care for the patient as laid out in the A&P.   Dwyane Dee, MD [facility] 01/19/2021, 12:58 PM

## 2021-01-19 NOTE — Care Management Important Message (Signed)
Important Message  Patient Details IM Letter placed in Patients room. Name: [name] MRN: [id number] Date of Birth: 24-Feb-1950   Medicare Important Message Given:  Yes     [name] 01/19/2021, 2:03 PM

## 2021-01-19 NOTE — NC FL2 (Signed)
[place] LEVEL OF CARE SCREENING TOOL     IDENTIFICATION  Patient Name: [name] Birthdate: [birth year] Sex: male Admission Date (Current Location): 01/15/2021  [facility] and [place] Number:  Herbalist and Address:  [facility],  [place] [address] [place]      Provider Number: [id number]  Attending Physician Name and Address:  Dwyane Dee, MD  Relative Name and Phone Number:       Current Level of Care: Hospital Recommended Level of Care: [place] Prior Approval Number:    Date Approved/Denied:   PASRR Number:    Discharge Plan: Other (Comment) ([facility])    Current Diagnoses: Patient Active Problem List   Diagnosis Date Noted   Hypothyroidism    Macrocytic anemia    Morbid obesity with BMI of 60.0-69.9, adult ([place])    Pneumonia due to COVID-19 virus 01/15/2021   Cellulitis of left leg    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[place])    Infection of prosthetic left knee joint ([place]) 11/08/2019   Septic joint of left knee joint ([place]) 11/06/2019   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[place]) 08/15/2019   Open knee wound 08/14/2019   S/P left TK revision 04/03/2019   Administration of long-term prophylactic antibiotics    History of streptococcal infection    History of DVT (deep vein thrombosis)    S/P rev left TK 12/15/2015   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   Preoperative cardiovascular examination 04/17/2013   OSA on CPAP 11/27/2012   Chronic diastolic heart failure ([place]) 09/10/2012   Chronic anticoagulation, with Xarelto 09/10/2012   DVT (deep venous thrombosis), possible 09/10/2012   SOB (shortness of breath) 08/26/2012   Chest discomfort 08/26/2012   Persistent atrial fibrillation ([place]) 08/26/2012   Super obesity 06/27/2012   Expected blood loss anemia 06/27/2012   S/P left TK revision 06/23/2012   Septic arthritis of knee ([place])    Cellulitis 09/19/2010   METHICILLIN  RESISTANT STAPHYLOCOCCUS AUREUS INFECTION 09/10/2009   STREPTOCOCCUS INFECTION CCE & UNS SITE GROUP C 07/04/2008   DM 07/04/2008   CHRONIC KIDNEY DISEASE UNSPECIFIED 07/04/2008   PULMONARY EMBOLISM, HX OF 07/04/2008   INFECTION DUE TO INTERNAL ORTH DEVICE NEC 03/02/2006    Orientation RESPIRATION BLADDER Height & Weight     Self, Time, Situation  O2 Continent Weight: (!) 199.6 kg Height:  5\' 8"  (172.7 cm)  BEHAVIORAL SYMPTOMS/MOOD NEUROLOGICAL BOWEL NUTRITION STATUS      Continent Diet (Heart Healthy)  AMBULATORY STATUS COMMUNICATION OF NEEDS Skin     Verbally                         Personal Care Assistance Level of Assistance  Bathing, Feeding, Dressing Bathing Assistance: Limited assistance Feeding assistance: Limited assistance Dressing Assistance: Limited assistance     Functional Limitations Info  Sight, Hearing, Speech Sight Info: Adequate Hearing Info: Adequate Speech Info: Adequate    SPECIAL CARE FACTORS FREQUENCY  PT (By licensed PT), OT (By licensed OT)     PT Frequency:  (3x week) OT Frequency:  (3x week)            Contractures Contractures Info: Not present    Additional Factors Info  Code Status, Allergies Code Status Info:  (Full) Allergies Info:  (Morphine)           Current Medications (01/19/2021):  This is the current hospital active medication list Current Facility-Administered Medications  Medication Dose Route Frequency Provider Last Rate Last Admin   (feeding supplement) PROSource Plus liquid 30 mL  30 mL Oral BID BM Dwyane Dee, MD   30 mL at 01/18/21 2141   acetaminophen (TYLENOL) tablet 650 mg  650 mg Oral Q6H PRN Marylyn Ishihara, Tyrone A, DO       ascorbic acid (VITAMIN C) tablet 500 mg  500 mg Oral Daily Marylyn Ishihara, Tyrone A, DO   500 mg at 01/19/21 0800   azithromycin (ZITHROMAX) tablet 500 mg  500 mg Oral Daily Dwyane Dee, MD   500 mg at 01/19/21 0800   cefTRIAXone (ROCEPHIN) 1 g in sodium chloride 0.9 % 100 mL IVPB  1 g  Intravenous Q24H Dwyane Dee, MD 200 mL/hr at 01/18/21 2140 1 g at 01/18/21 2140   chlorhexidine (PERIDEX) 0.12 % solution 15 mL  15 mL Mouth Rinse BID Marylyn Ishihara, Tyrone A, DO   15 mL at 01/19/21 0802   Chlorhexidine Gluconate Cloth 2 % PADS 6 each  6 each Topical Daily Marylyn Ishihara, Tyrone A, DO   6 each at 01/18/21 0940   diltiazem (CARDIZEM CD) 24 hr capsule 240 mg  240 mg Oral Daily Dwyane Dee, MD   240 mg at 01/19/21 0800   furosemide (LASIX) injection 20 mg  20 mg Intravenous Daily Dwyane Dee, MD   20 mg at 01/19/21 0801   guaiFENesin-dextromethorphan (ROBITUSSIN DM) 100-10 MG/5ML syrup 10 mL  10 mL Oral Q4H PRN Marylyn Ishihara, Tyrone A, DO       Ipratropium-Albuterol (COMBIVENT) respimat 1 puff  1 puff Inhalation TID Cherylann Ratel A, DO   1 puff at 01/19/21 0744   levothyroxine (SYNTHROID) tablet 125 mcg  125 mcg Oral QAC breakfast Dwyane Dee, MD   125 mcg at 01/19/21 0538   MEDLINE mouth rinse  15 mL Mouth Rinse q12n4p Kyle, Tyrone A, DO   15 mL at 01/17/21 1542   methylPREDNISolone sodium succinate (SOLU-MEDROL) 200 mg in sodium chloride 0.9 % 50 mL IVPB  200 mg Intravenous Eddie Candle, MD 106.4 mL/hr at 01/19/21 0335 200 mg at 01/19/21 0335   metoprolol tartrate (LOPRESSOR) tablet 50 mg  50 mg Oral BID Dwyane Dee, MD   50 mg at 01/19/21 0800   multivitamin with minerals tablet 1 tablet  1 tablet Oral Daily Dwyane Dee, MD   1 tablet at 01/19/21 0800   protein supplement (ENSURE MAX) liquid  11 oz Oral BID Dwyane Dee, MD   11 oz at 01/19/21 7680   remdesivir 100 mg in sodium chloride 0.9 % 100 mL IVPB  100 mg Intravenous Daily Dwyane Dee, MD 200 mL/hr at 01/19/21 0814 100 mg at 01/19/21 8811   remdesivir 200 mg in sodium chloride 0.9% 250 mL IVPB  200 mg Intravenous Once Marylyn Ishihara, Tyrone A, DO       rivaroxaban (XARELTO) tablet 20 mg  20 mg Oral Q breakfast Dwyane Dee, MD   20 mg at 01/19/21 0315   zinc sulfate capsule 220 mg  220 mg Oral Daily Kyle, Tyrone A, DO   220 mg at  01/19/21 0800     Discharge Medications: Please see discharge summary for a list of discharge medications.  Relevant Imaging Results:  Relevant Lab Results:   Additional Information    Garrett Maland, Juliann Pulse, RN

## 2021-01-19 NOTE — Assessment & Plan Note (Addendum)
-   Oxygen has been able to be weaned slowly during hospitalization.  Currently down to 2 L.  Unable to perform walk test given immobility - Arranging for home oxygen if able at time of discharge - given his underlying OHS/OSA, he may have a chronic component as well and possibly require long term O2

## 2021-01-20 DIAGNOSIS — J1282 Pneumonia due to coronavirus disease 2019: Secondary | ICD-10-CM

## 2021-01-20 DIAGNOSIS — U071 COVID-19: Secondary | ICD-10-CM

## 2021-01-20 LAB — COMPREHENSIVE METABOLIC PANEL
ALT: 31 U/L (ref 0–44)
AST: 27 U/L (ref 15–41)
Albumin: 2.7 g/dL — ABNORMAL LOW (ref 3.5–5.0)
Alkaline Phosphatase: 50 U/L (ref 38–126)
Anion gap: 6 (ref 5–15)
BUN: 38 mg/dL — ABNORMAL HIGH (ref 8–23)
CO2: 31 mmol/L (ref 22–32)
Calcium: 9 mg/dL (ref 8.9–10.3)
Chloride: 102 mmol/L (ref 98–111)
Creatinine, Ser: 0.77 mg/dL (ref 0.61–1.24)
GFR, Estimated: >60 mL/min (ref >60)
Glucose, Bld: 151 mg/dL — ABNORMAL HIGH (ref 70–99)
Potassium: 3.5 mmol/L (ref 3.5–5.1)
Sodium: 139 mmol/L (ref 135–145)
Total Bilirubin: 0.5 mg/dL (ref 0.3–1.2)
Total Protein: 6.4 g/dL — ABNORMAL LOW (ref 6.5–8.1)

## 2021-01-20 LAB — CBC WITH DIFFERENTIAL/PLATELET
Abs Immature Granulocytes: 0.05 10*3/uL (ref 0.00–0.07)
Basophils Absolute: 0 10*3/uL (ref 0.0–0.1)
Basophils Relative: 0 %
Eosinophils Absolute: 0 10*3/uL (ref 0.0–0.5)
Eosinophils Relative: 0 %
HCT: 36.2 % — ABNORMAL LOW (ref 39.0–52.0)
Hemoglobin: 10.9 g/dL — ABNORMAL LOW (ref 13.0–17.0)
Immature Granulocytes: 1 %
Lymphocytes Relative: 10 %
Lymphs Abs: 0.5 10*3/uL — ABNORMAL LOW (ref 0.7–4.0)
MCH: 29.7 pg (ref 26.0–34.0)
MCHC: 30.1 g/dL (ref 30.0–36.0)
MCV: 98.6 fL (ref 80.0–100.0)
Monocytes Absolute: 0.2 10*3/uL (ref 0.1–1.0)
Monocytes Relative: 3 %
Neutro Abs: 4 10*3/uL (ref 1.7–7.7)
Neutrophils Relative %: 86 %
Platelets: 212 10*3/uL (ref 150–400)
RBC: 3.67 MIL/uL — ABNORMAL LOW (ref 4.22–5.81)
RDW: 15.5 % (ref 11.5–15.5)
WBC: 4.7 10*3/uL (ref 4.0–10.5)
nRBC: 1.3 % — ABNORMAL HIGH (ref 0.0–0.2)

## 2021-01-20 LAB — MAGNESIUM: Magnesium: 2.3 mg/dL (ref 1.7–2.4)

## 2021-01-20 LAB — CULTURE, BLOOD (ROUTINE X 2)
Culture: NO GROWTH
Culture: NO GROWTH
Special Requests: ADEQUATE
Special Requests: ADEQUATE

## 2021-01-20 LAB — C-REACTIVE PROTEIN: CRP: 2.5 mg/dL — ABNORMAL HIGH (ref <1.0)

## 2021-01-20 LAB — D-DIMER, QUANTITATIVE: D-Dimer, Quant: <0.27 ug/mL-FEU (ref 0.00–0.50)

## 2021-01-20 LAB — FERRITIN: Ferritin: 98 ng/mL (ref 24–336)

## 2021-01-20 LAB — LACTATE DEHYDROGENASE: LDH: 148 U/L (ref 98–192)

## 2021-01-20 MED ORDER — GUAIFENESIN-DM 100-10 MG/5ML PO SYRP
10.0000 mL | ORAL_SOLUTION | ORAL | 1 refills | Status: DC | PRN
Start: 2021-01-20 — End: 2021-09-22

## 2021-01-20 MED ORDER — ZINC SULFATE 220 (50 ZN) MG PO CAPS: 220.0000 mg | ORAL_CAPSULE | Freq: Every day | ORAL | 0 refills | Status: AC

## 2021-01-20 MED ORDER — PREDNISONE 20 MG PO TABS: 40.0000 mg | ORAL_TABLET | Freq: Every day | ORAL | 0 refills | Status: AC

## 2021-01-20 MED ORDER — ALBUTEROL SULFATE HFA 108 (90 BASE) MCG/ACT IN AERS: 2.0000 | INHALATION_SPRAY | Freq: Four times a day (QID) | RESPIRATORY_TRACT | 1 refills | Status: DC | PRN

## 2021-01-20 MED ORDER — ASCORBIC ACID 500 MG PO TABS: 500.0000 mg | ORAL_TABLET | Freq: Every day | ORAL | 0 refills | Status: AC

## 2021-01-20 NOTE — Progress Notes (Signed)
[facility] called. Report given. They are aware that transport is on the way to pick up the patient and that the patient will be coming there tonight. Stated someone would be there to let the patient in upon arrival to [facility].

## 2021-01-20 NOTE — Progress Notes (Signed)
SATURATION QUALIFICATIONS: (This note is used to comply with regulatory documentation for home oxygen)  Patient Saturations on Room Air at Rest = 84%  Patient Saturations on Room Air while Ambulating = 82%  Patient Saturations on 2 Liters of oxygen while Ambulating = 90%  Please briefly explain why patient needs home oxygen: Patient's O2 sats dropped when the oxygen is off.

## 2021-01-20 NOTE — Progress Notes (Deleted)
Cardiology Office Note:    Date:  01/20/2021   ID:  [name], DOB 08-Nov-1950, MRN [id number]  PCP:  Bernerd Limbo, MD  Cardiologist:  Sanda Klein, MD   Referring MD: Bernerd Limbo, MD   No chief complaint on file. ***  History of Present Illness:    [name] is a 70 y.o. male with a hx of atrial fibrillation, prior tachycardia mediated cardiomyopathy, chronic diastolic heart failure, recurrent venous thromboembolic events, obesity, and OSA on CPAP.  He is on chronic anticoagulation with xarelto.  Due to 18 previous knee surgeries, his mobility is limited.  Exertional symptoms have been challenging to evaluate given his obesity and sedentary lifestyle due to knee issues. He was last seen by Dr. Timmie Foerster 09/20/19 and was doing well from a cardiovascular standpoint.   He presents today for annual follow up. Unfortunately looks like he was admitted to the hospital with COVID-19 on 01/09/2021 and discharged yesterday 01/20/2021.  He was treated with remdesivir, steroids, and O2.     Atrial fibrillation Maintained on 50 mg metoprolol twice daily and 240 mg Cardizem daily.   History of VTE Chronic anticoagulation No bleeding issues on Xarelto.   Chronic diastolic heart failure    Recent COVID-19 infection He required hospitalization and was supported with remdesivir, prednisone, and O2.     Past Medical History:  Diagnosis Date   CHF (congestive heart failure) ([place]) 05/2019   Chronic anticoagulation    with Xarelto   Complication of anesthesia    PT STATES HARD TO WAKE UP AFTER ONE SUGERY -STATES THE SURGERY TOOK LONGER THAN EXPECTED.  NO PROBLEMS WITH ANY OTHER SURGERY   Complication of anesthesia    in 12/2018-states started  shaking after surgery, could not get warm    Dysrhythmia    A-fib   GERD (gastroesophageal reflux disease)    History of blood clots    History of blood transfusion    Hypothyroidism    Pain    BACK PAIN - PT ATTRIBUTES TO  THE WAY HE WALKS DUE TO LEFT KNEE PROBLEM   Persistent atrial fibrillation ([facility])    Septic arthritis of knee ([facility])    LEFT KNEE   Shortness of breath    WITH EXERTION AND PAIN   Sleep apnea    uses CPAP,    Past Surgical History:  Procedure Laterality Date   2010 REMOVAL OF LEFT TOTAL KNEE     AMPUTATION Left 11/17/2019   Procedure: AMPUTATION ABOVE KNEE;  Surgeon: Newt Minion, MD;  Location: [facility];  Service: Orthopedics;  Laterality: Left;   CARDIAC CATHETERIZATION  2006   CARDIOVERSION N/A 08/28/2012   Procedure: CARDIOVERSION;  Surgeon: Sanda Klein, MD;  Location: [facility];  Service: Cardiovascular;  Laterality: N/A;   CARDIOVERSION N/A 09/01/2012   Procedure: CARDIOVERSION;  Surgeon: Pixie Casino, MD;  Location: [facility];  Service: Cardiovascular;  Laterality: N/A;   COLONOSCOPY N/A 06/13/2013   Procedure: COLONOSCOPY;  Surgeon: Inda Castle, MD;  [facility]: [facility];  Service: Endoscopy;  Laterality: N/A;   EXCISIONAL TOTAL KNEE ARTHROPLASTY WITH ANTIBIOTIC SPACERS Left 09/21/2018   Procedure: Resection of tibia versus both components with placement of antibiotic spacer;  Surgeon: Paralee Cancel, MD;  Location: [facility];  Service: Orthopedics;  Laterality: Left;  2.5 hrs   EXCISIONAL TOTAL KNEE ARTHROPLASTY WITH ANTIBIOTIC SPACERS Left 01/02/2019   Procedure: Repeat Washout and placement of antibiotic spacer left knee;  Surgeon: Paralee Cancel, MD;  Location: [facility];  Service: Orthopedics;  Laterality: Left;  2 hrs   HYDROCELECTOY   2012   I & D EXTREMITY Left 08/14/2019   Procedure: IRRIGATION AND DEBRIDEMENT LEFT KNEE WOUND;  Surgeon: Paralee Cancel, MD;  Location: [facility];  Service: Orthopedics;  Laterality: Left;  60 mins   I & D KNEE WITH POLY EXCHANGE Left 05/17/2019   Procedure: IRRIGATION AND DEBRIDEMENT KNEE WITH POLY EXCHANGE;  Surgeon: Paralee Cancel, MD;  Location: [facility];  Service: Orthopedics;  Laterality: Left;  90 mins   IRRIGATION AND DEBRIDEMENT KNEE Left 04/03/2019    Procedure: REIMPLANTATION OF LEFT KNEE TIBIAL COMPONENT.;  Surgeon: Paralee Cancel, MD;  Location: [facility];  Service: Orthopedics;  Laterality: Left;  need 120 min   IRRIGATION AND DEBRIDEMENT KNEE Left 11/06/2019   Procedure: IRRIGATION AND DEBRIDEMENT KNEE;  Surgeon: Paralee Cancel, MD;  Location: [facility];  Service: Orthopedics;  Laterality: Left;  90 mins   LEFT KNEE ARTHROSCOPY  1999   LEFT KNEE SURGERY UPPER TIBIAL OSTOMY     LEFT TOTAL KNEE REMOVAL FOR INFECTION  2006   REIMPLANTATION LEFT TOTAL KNEE  2008   REIMPLANTATION LEFT TOTAL KNEE   2006   REIMPLANTATION OF TOTAL KNEE Left 06/23/2012   Procedure: REIMPLANTATION OF LEFT TOTAL KNEE;  Surgeon: Mauri Pole, MD;  Location: [facility];  Service: Orthopedics;  Laterality: Left;   REMOVAL LEFT TOTAL KNEE   2008   REPLACEMENT LEFT KNEE  2002   REPLACEMENT RIGHT KNEE  2003   REVISION LEFT KNEE CAP   2004   RIGHT KNEE ARTHROSCOPY  1998   TEE WITHOUT CARDIOVERSION N/A 08/28/2012   Procedure: TRANSESOPHAGEAL ECHOCARDIOGRAM (TEE);  Surgeon: Sanda Klein, MD;  Location: [facility];  Service: Cardiovascular;  Laterality: N/A;   TONSILLECTOMY     TOTAL KNEE REVISION Left 12/15/2015   Procedure: TOTAL KNEE REVISION REPLACEMENT;  Surgeon: Paralee Cancel, MD;  Location: [facility];  Service: Orthopedics;  Laterality: Left;  Adductor Block    Current Medications: No outpatient medications have been marked as taking for the 01/21/21 encounter (Appointment) with Ledora Bottcher, [place].     Allergies:   Morphine   Social History   Socioeconomic History   Marital status: Single    Spouse name: Not on file   Number of children: Not on file   Years of education: Not on file   Highest education level: Not on file  Occupational History   Not on file  Tobacco Use   Smoking status: Never   Smokeless tobacco: Never  Vaping Use   Vaping Use: Never used  Substance and Sexual Activity   Alcohol use: Not Currently    Comment: not since 1999. However, had  around 6 drinks a day between 1993-1999, roughly.   Drug use: No   Sexual activity: Yes    Partners: Female  Other Topics Concern   Not on file  Social History Narrative   Not on file   Social Determinants of Health   Financial Resource Strain: Not on file  Food Insecurity: Not on file  Transportation Needs: Not on file  Physical Activity: Not on file  Stress: Not on file  Social Connections: Not on file     Family History: The patient's ***family history includes Breast cancer in his sister; Cervical cancer in his sister; Diabetes in his sister; Heart disease (age of onset: 39) in his father; Liver cancer in his sister; Pancreatic cancer in his sister. There is no history of  Colon cancer, Throat cancer, Stomach cancer, Kidney disease, or Liver disease.  ROS:   Please see the history of present illness.    *** All other systems reviewed and are negative.  EKGs/Labs/Other Studies Reviewed:    The following studies were reviewed today: ***  EKG:  EKG is *** ordered today.  The ekg ordered today demonstrates ***  Recent Labs: 01/15/2021: B Natriuretic Peptide 260.9 01/20/2021: ALT 31; BUN 38; Creatinine, Ser 0.77; Hemoglobin 10.9; Magnesium 2.3; Platelets 212; Potassium 3.5; Sodium 139  Recent Lipid Panel    Component Value Date/Time   CHOL 109 08/27/2012 0534   TRIG 31 01/15/2021 0945   HDL 45 08/27/2012 0534   CHOLHDL 2.4 08/27/2012 0534   VLDL 13 08/27/2012 0534   LDLCALC 51 08/27/2012 0534    Physical Exam:    VS:  There were no vitals taken for this visit.    Wt Readings from Last 3 Encounters:  01/17/21 (!) 440 lb (199.6 kg)  08/23/20 (!) 394 lb 13.5 oz (179.1 kg)  04/24/20 (!) 375 lb (170.1 kg)     GEN: *** Well nourished, well developed in no acute distress HEENT: Normal NECK: No JVD; No carotid bruits LYMPHATICS: No lymphadenopathy CARDIAC: ***RRR, no murmurs, rubs, gallops RESPIRATORY:  Clear to auscultation without rales, wheezing or rhonchi   ABDOMEN: Soft, non-tender, non-distended MUSCULOSKELETAL:  No edema; No deformity  SKIN: Warm and dry NEUROLOGIC:  Alert and oriented x 3 PSYCHIATRIC:  Normal affect   ASSESSMENT:    No diagnosis found. PLAN:    In order of problems listed above:  No diagnosis found.   Medication Adjustments/Labs and Tests Ordered: Current medicines are reviewed at length with the patient today.  Concerns regarding medicines are outlined above.  No orders of the defined types were placed in this encounter.  No orders of the defined types were placed in this encounter.   Signed, Ledora Bottcher, [place]  01/20/2021 8:54 AM    [facility]

## 2021-01-20 NOTE — Progress Notes (Signed)
Tried Calling facility to give report. No one was available to take report.

## 2021-01-20 NOTE — Plan of Care (Signed)
  Problem: Clinical Measurements: Goal: Will remain free from infection Outcome: Progressing Goal: Diagnostic test results will improve Outcome: Progressing Goal: Respiratory complications will improve Outcome: Progressing   Problem: Activity: Goal: Risk for activity intolerance will decrease Outcome: Progressing   

## 2021-01-20 NOTE — Progress Notes (Signed)
Pt refused BiPAP qhs.  Pt states that he will wear his nasal cannula instead.  Pt encouraged to contact RT should he change his mind.

## 2021-01-20 NOTE — Discharge Summary (Signed)
Physician Discharge Summary  [name] OJJ:[id number] DOB: Jan 29, 1951 DOA: 01/15/2021  PCP: Bernerd Limbo, MD  Admit date: 01/15/2021 Discharge date: 01/20/2021  Admitted From: ALF Disposition: ALF Discharge Condition:Stable CODE STATUS:FULL Diet recommendation: Heart Healthy   Brief/Interim Summary:  [name] is a 70 y.o. male with medical history significant of CHF, a fib on xarelto, hypothyroidism, morbid obesity presented with  worsening dyspnea and cough . He underwent work-up in the ER was found to be positive for COVID-19.  He was also found to be hypoxic requiring nonrebreather. He was started on remdesivir, steroids.  His respiratory status gradually improved.  He finished the course of remdesivir.  He is medically stable for discharge back to ALF today.  Following problems were addressed during his hospitalization :  Pneumonia due to COVID-19 virus -- Treated with  remdesivir, steroids, oxygen - Down trending  inflammatory markers -Completed the course of remdesivir.  He will continue 5 days course of steroids oral on discharge   Acute respiratory failure with hypoxia (HCC) - Oxygen has been able to be weaned slowly during hospitalization.  Currently down to room air /2 L. - given his underlying OHS/OSA, he may have a chronic component as well and possibly require long term O2   Persistent atrial fibrillation (HCC) - Continue diltiazem and Xarelto - continue metoprolol   OSA on CPAP - Patient stated that he has not been using CPAP at home for quite a while   Chronic diastolic heart failure ([place]) - Does not appear to be volume overloaded or in exacerbation.  Lower extremity edema is baseline - Continue Lasix - Continue metoprolol   Morbid obesity with BMI of 60.0-69.9, adult (HCC) - Complicates overall prognosis and care - Body mass index is 64.16 kg/m.   Hypothyroidism - Continue Synthroid     Discharge Diagnoses:  Principal Problem:    Pneumonia due to COVID-19 virus Active Problems:   Persistent atrial fibrillation (HCC)   Chronic diastolic heart failure (HCC)   OSA on CPAP   Hypothyroidism   Macrocytic anemia   Morbid obesity with BMI of 60.0-69.9, adult ([place])   Acute respiratory failure with hypoxia [facility])    Discharge Instructions  Discharge Instructions     Diet - low sodium heart healthy   Complete by: As directed    Discharge instructions   Complete by: As directed    1)Please take prescribed medications as instructed 2)Follow up with your PCP in a week   Increase activity slowly   Complete by: As directed       Allergies as of 01/20/2021       Reactions   Morphine Hives        Medication List     TAKE these medications    acetaminophen 500 MG tablet Commonly known as: TYLENOL Take 500 mg by mouth 3 (three) times daily as needed for mild pain or fever.   albuterol 108 (90 Base) MCG/ACT inhaler Commonly known as: VENTOLIN HFA Inhale 2 puffs into the lungs every 6 (six) hours as needed for wheezing or shortness of breath.   ascorbic acid 500 MG tablet Commonly known as: VITAMIN C Take 1 tablet (500 mg total) by mouth daily for 7 days. Start taking on: January 21, 2021   CAPSAICIN EX Apply 1 application topically 3 (three) times daily as needed (Phantom Limb Pain). 0.025%   diltiazem 240 MG 24 hr capsule Commonly known as: CARDIZEM CD TAKE 1 CAPSULE BY MOUTH DAILY   ferrous  sulfate 325 (65 FE) MG tablet Commonly known as: FerrouSul Take 1 tablet (325 mg total) by mouth 3 (three) times daily with meals for 14 days.   furosemide 20 MG tablet Commonly known as: LASIX Take 20 mg by mouth daily.   gabapentin 800 MG tablet Commonly known as: NEURONTIN Take 800 mg by mouth 3 (three) times daily.   guaiFENesin-dextromethorphan 100-10 MG/5ML syrup Commonly known as: ROBITUSSIN DM Take 10 mLs by mouth every 4 (four) hours as needed for cough.   levothyroxine 125 MCG  tablet Commonly known as: SYNTHROID Take 125 mcg by mouth daily before breakfast.   melatonin 5 MG Tabs Take 10 mg by mouth at bedtime.   methocarbamol 500 MG tablet Commonly known as: ROBAXIN Take 1,000 mg by mouth 4 (four) times daily as needed for muscle spasms.   metoprolol tartrate 50 MG tablet Commonly known as: LOPRESSOR TAKE 1 TABLET BY MOUTH TWICE DAILY   multivitamin with minerals Tabs tablet Take 1 tablet by mouth daily.   naphazoline-glycerin 0.012-0.2 % Soln Commonly known as: CLEAR EYES REDNESS Place 1-2 drops into both eyes 4 (four) times daily as needed for eye irritation.   oxycodone 5 MG capsule Commonly known as: OXY-IR Take 5 mg by mouth every 6 (six) hours as needed for pain.   potassium chloride 10 MEQ tablet Commonly known as: KLOR-CON M Take 10 mEq by mouth daily.   predniSONE 20 MG tablet Commonly known as: DELTASONE Take 2 tablets (40 mg total) by mouth daily for 5 days. Start taking on: January 21, 2021   vitamin B-12 1000 MCG tablet Commonly known as: CYANOCOBALAMIN Take 1,000 mcg by mouth daily.   Xarelto 20 MG Tabs tablet Generic drug: rivaroxaban TAKE 1 TABLET BY MOUTH DAILY WITH BREAKFAST What changed:  how much to take when to take this   zinc sulfate 220 (50 Zn) MG capsule Take 1 capsule (220 mg total) by mouth daily for 7 days. Start taking on: January 21, 2021               Durable Medical Equipment  (From admission, onward)           Start     Ordered   01/19/21 1256  For home use only DME oxygen  Once       Question Answer Comment  Length of Need 6 Months   Mode or (Route) Nasal cannula   Liters per Minute 2   Frequency Continuous (stationary and portable oxygen unit needed)   Oxygen conserving device Yes   Oxygen delivery system Gas      01/19/21 1255            Contact information for after-discharge care     [facility] .   Service: Assisted  Living Contact information: [phone] [address] [place] [phone]                    Allergies  Allergen Reactions   Morphine Hives    Consultations: None   Procedures/Studies: DG Chest Port 1 View  Result Date: 01/15/2021 CLINICAL DATA:  Shortness of breath EXAM: PORTABLE CHEST 1 VIEW COMPARISON:  08/14/2020 FINDINGS: Transverse diameter of heart is increased. Infiltrate is seen in the left lower lung fields. There is poor inspiration. Right lung is clear. There is no significant pleural effusion or pneumothorax. IMPRESSION: Cardiomegaly. Increased density in the left lower lung fields suggests atelectasis/pneumonia.  Electronically Signed   By: Elmer Picker M.D.   On: 01/15/2021 09:37      Subjective: Patient seen and examined the bedside this morning.  Hemodynamically stable for discharge today  Discharge Exam: Vitals:   01/19/21 2145 01/20/21 0624  BP: 129/71 (!) 134/93  Pulse: 83 76  Resp: 19 20  Temp: 98.4 F (36.9 C) 98.7 F (37.1 C)  SpO2: 93% 95%   Vitals:   01/19/21 1331 01/19/21 2145 01/19/21 2145 01/20/21 0624  BP: 135/87 129/71 129/71 (!) 134/93  Pulse: 75 83 83 76  Resp: 18  19 20   Temp: 97.9 F (36.6 C)  98.4 F (36.9 C) 98.7 F (37.1 C)  TempSrc: Oral  Oral Oral  SpO2: 93%  93% 95%  Weight:      Height:        General: Pt is alert, awake, not in acute distress, morbidly obese Cardiovascular: RRR, S1/S2 +, no rubs, no gallops Respiratory: CTA bilaterally, no wheezing, no rhonchi Abdominal: Soft, NT, ND, bowel sounds + Extremities: no edema, no cyanosis, left BKA    The results of significant diagnostics from this hospitalization (including imaging, microbiology, ancillary and laboratory) are listed below for reference.     Microbiology: Recent Results (from the past 240 hour(s))  Resp Panel by RT-PCR (Flu A&B, Covid) Nasopharyngeal Swab     Status: Abnormal   Collection Time: 01/15/21  9:43 AM    Specimen: Nasopharyngeal Swab; Nasopharyngeal(NP) swabs in vial transport medium  Result Value Ref Range Status   SARS Coronavirus 2 by RT PCR POSITIVE (A) NEGATIVE Final    Comment: RESULT CALLED TO, READ BACK BY AND VERIFIED WITH: Asher Muir. RN ON 01/15/2021 @ 1041 (NOTE) SARS-CoV-2 target nucleic acids are DETECTED.  The SARS-CoV-2 RNA is generally detectable in upper respiratory specimens during the acute phase of infection. Positive results are indicative of the presence of the identified virus, but do not rule out bacterial infection or co-infection with other pathogens not detected by the test. Clinical correlation with patient history and other diagnostic information is necessary to determine patient infection status. The expected result is Negative.  Fact Sheet for Patients: EntrepreneurPulse.com.au  Fact Sheet for Healthcare Providers: IncredibleEmployment.be  This test is not yet approved or cleared by the [place] FDA and  has been authorized for detection and/or diagnosis of SARS-CoV-2 by FDA under an Emergency Use Authorization (EUA).  This EUA will remain in effect (meaning this test can be  used) for the duration of  the COVID-19 declaration under Section 564(b)(1) of the Act, 21 U.S.C. section 360bbb-3(b)(1), unless the authorization is terminated or revoked sooner.     Influenza A by PCR NEGATIVE NEGATIVE Final   Influenza B by PCR NEGATIVE NEGATIVE Final    Comment: (NOTE) The Xpert Xpress SARS-CoV-2/FLU/RSV plus assay is intended as an aid in the diagnosis of influenza from Nasopharyngeal swab specimens and should not be used as a sole basis for treatment. Nasal washings and aspirates are unacceptable for Xpert Xpress SARS-CoV-2/FLU/RSV testing.  Fact Sheet for Patients: EntrepreneurPulse.com.au  Fact Sheet for Healthcare Providers: IncredibleEmployment.be  This test is not  yet approved or cleared by the [place] FDA and has been authorized for detection and/or diagnosis of SARS-CoV-2 by FDA under an Emergency Use Authorization (EUA). This EUA will remain in effect (meaning this test can be used) for the duration of the COVID-19 declaration under Section 564(b)(1) of the Act, 21 U.S.C. section 360bbb-3(b)(1), unless the authorization is terminated or  revoked.  Performed at [facility], [place] [address], [place], [place] [address]   Blood Culture (routine x 2)     Status: None   Collection Time: 01/15/21  9:45 AM   Specimen: BLOOD  Result Value Ref Range Status   Specimen Description   Final    BLOOD RIGHT ANTECUBITAL Performed at [facility] [address] [place], [place] [address]    Special Requests   Final    BOTTLES DRAWN AEROBIC AND ANAEROBIC Blood Culture adequate volume Performed at [place] [address] [place], [place] [address]    Culture   Final    NO GROWTH 5 DAYS Performed at [facility], [place] [address], [place], [place] [address]    Report Status 01/20/2021 FINAL  Final  Blood Culture (routine x 2)     Status: None   Collection Time: 01/15/21 10:00 AM   Specimen: BLOOD  Result Value Ref Range Status   Specimen Description   Final    BLOOD LEFT ANTECUBITAL Performed at [facility] [address] [place], [place] [address]    Special Requests   Final    BOTTLES DRAWN AEROBIC AND ANAEROBIC Blood Culture adequate volume Performed at [facility] [address], [place], [place] [address]    Culture   Final    NO GROWTH 5 DAYS Performed at [facility], [place] [address] [place], [place] [address]    Report Status 01/20/2021 FINAL  Final  MRSA Next Gen by PCR, Nasal     Status: None   Collection Time: 01/16/21  3:27 AM   Specimen: Nasal Mucosa; Nasal Swab  Result Value Ref Range Status   MRSA by PCR Next  Gen NOT DETECTED NOT DETECTED Final    Comment: (NOTE) The GeneXpert MRSA Assay (FDA approved for NASAL specimens only), is one component of a comprehensive MRSA colonization surveillance program. It is not intended to diagnose MRSA infection nor to guide or monitor treatment for MRSA infections. Test performance is not FDA approved in patients less than 6 years old. Performed at [facility], [place] [address], [place], [place] [address]   Expectorated Sputum Assessment w Gram Stain, Rflx to Resp Cult     Status: None   Collection Time: 01/16/21  9:04 AM   Specimen: Expectorated Sputum  Result Value Ref Range Status   Specimen Description EXPECTORATED SPUTUM  Final   Special Requests NONE  Final   Sputum evaluation   Final    THIS SPECIMEN IS ACCEPTABLE FOR SPUTUM CULTURE Performed at [facility], [place] [address] [place], [place] [address]    Report Status 01/16/2021 FINAL  Final  Culture, Respiratory w Gram Stain     Status: None   Collection Time: 01/16/21  9:04 AM  Result Value Ref Range Status   Specimen Description   Final    EXPECTORATED SPUTUM Performed at [facility], [place] [address] [place], [place] [address]    Special Requests   Final    NONE Reflexed from [id number] Performed at [facility], [place] [address], [place], [place] [address]    Gram Stain   Final    FEW WBC PRESENT, PREDOMINANTLY PMN FEW GRAM POSITIVE COCCI IN PAIRS RARE GRAM POSITIVE RODS    Culture   Final    MODERATE Normal respiratory flora-no Staph aureus or Pseudomonas seen Performed at [facility], [address], [place], [place] [address]    Report Status 01/18/2021 FINAL  Final     Labs: BNP (last 3 results) Recent Labs    01/15/21 0945  BNP 355.9*   Basic Metabolic Panel: Recent Labs  Lab 01/16/21 0828 01/17/21 0258 01/18/21 0528 01/19/21 0441 01/20/21 0452  NA 137 139 141 140 139  K 4.6 4.3 3.5  3.5 3.5  CL 104 103 103 102 102  CO2 27 29 31 30 31   GLUCOSE 148* 157* 165* 164* 151*  BUN 18 30* 38* 39* 38*  CREATININE 0.92 0.91 0.79 0.76 0.77  CALCIUM 9.2 9.4 9.4 9.1 9.0  MG  --  2.2 2.2 2.2 2.3   Liver Function Tests: Recent Labs  Lab 01/16/21 0828 01/17/21 0258 01/18/21 0528 01/19/21 0441 01/20/21 0452  AST 17 15 15 21 27   ALT 13 14 14 20 31   ALKPHOS 58 59 57 48 50  BILITOT 0.5 0.4 0.5 0.7 0.5  PROT 6.9 7.0 7.0 6.5 6.4*  ALBUMIN 2.9* 2.9* 2.9* 2.7* 2.7*   No results for input(s): LIPASE, AMYLASE in the last 168 hours. No results for input(s): AMMONIA in the last 168 hours. CBC: Recent Labs  Lab 01/16/21 0828 01/17/21 0258 01/18/21 0528 01/19/21 0441 01/20/21 0452  WBC 4.7 5.2 4.1 3.6* 4.7  NEUTROABS 4.2 4.4 3.4 3.0 4.0  HGB 9.5* 9.8* 10.3* 10.4* 10.9*  HCT 31.9* 32.4* 33.9* 34.5* 36.2*  MCV 103.2* 98.8 98.5 99.4 98.6  PLT 180 196 207 198 212   Cardiac Enzymes: No results for input(s): CKTOTAL, CKMB, CKMBINDEX, TROPONINI in the last 168 hours. BNP: Invalid input(s): POCBNP CBG: No results for input(s): GLUCAP in the last 168 hours. D-Dimer Recent Labs    01/19/21 0441 01/20/21 0452  DDIMER <0.27 <0.27   Hgb A1c No results for input(s): HGBA1C in the last 72 hours. Lipid Profile No results for input(s): CHOL, HDL, LDLCALC, TRIG, CHOLHDL, LDLDIRECT in the last 72 hours. Thyroid function studies No results for input(s): TSH, T4TOTAL, T3FREE, THYROIDAB in the last 72 hours.  Invalid input(s): FREET3 Anemia work up Recent Labs    01/19/21 0441 01/20/21 0452  FERRITIN 113 98   Urinalysis    Component Value Date/Time   COLORURINE AMBER (A) 08/15/2020 1313   APPEARANCEUR HAZY (A) 08/15/2020 1313   LABSPEC 1.023 08/15/2020 1313   PHURINE 5.0 08/15/2020 1313   GLUCOSEU NEGATIVE 08/15/2020 1313   HGBUR NEGATIVE 08/15/2020 1313   [place] 08/15/2020 1313   KETONESUR NEGATIVE 08/15/2020 1313   PROTEINUR NEGATIVE 08/15/2020 1313    UROBILINOGEN 0.2 08/26/2012 1731   NITRITE NEGATIVE 08/15/2020 1313   LEUKOCYTESUR NEGATIVE 08/15/2020 1313   Sepsis Labs Invalid input(s): PROCALCITONIN,  WBC,  LACTICIDVEN Microbiology Recent Results (from the past 240 hour(s))  Resp Panel by RT-PCR (Flu A&B, Covid) Nasopharyngeal Swab     Status: Abnormal   Collection Time: 01/15/21  9:43 AM   Specimen: Nasopharyngeal Swab; Nasopharyngeal(NP) swabs in vial transport medium  Result Value Ref Range Status   SARS Coronavirus 2 by RT PCR POSITIVE (A) NEGATIVE Final    Comment: RESULT CALLED TO, READ BACK BY AND VERIFIED WITH: Asher Muir. RN ON 01/15/2021 @ 1041 (NOTE) SARS-CoV-2 target nucleic acids are DETECTED.  The SARS-CoV-2 RNA is generally detectable in upper respiratory specimens during the acute phase of infection. Positive results are indicative of the presence of the identified virus, but do not rule out bacterial infection or co-infection with other pathogens not detected  by the test. Clinical correlation with patient history and other diagnostic information is necessary to determine patient infection status. The expected result is Negative.  Fact Sheet for Patients: EntrepreneurPulse.com.au  Fact Sheet for Healthcare Providers: IncredibleEmployment.be  This test is not yet approved or cleared by the [place] FDA and  has been authorized for detection and/or diagnosis of SARS-CoV-2 by FDA under an Emergency Use Authorization (EUA).  This EUA will remain in effect (meaning this test can be  used) for the duration of  the COVID-19 declaration under Section 564(b)(1) of the Act, 21 U.S.C. section 360bbb-3(b)(1), unless the authorization is terminated or revoked sooner.     Influenza A by PCR NEGATIVE NEGATIVE Final   Influenza B by PCR NEGATIVE NEGATIVE Final    Comment: (NOTE) The Xpert Xpress SARS-CoV-2/FLU/RSV plus assay is intended as an aid in the diagnosis of influenza  from Nasopharyngeal swab specimens and should not be used as a sole basis for treatment. Nasal washings and aspirates are unacceptable for Xpert Xpress SARS-CoV-2/FLU/RSV testing.  Fact Sheet for Patients: EntrepreneurPulse.com.au  Fact Sheet for Healthcare Providers: IncredibleEmployment.be  This test is not yet approved or cleared by the [place] FDA and has been authorized for detection and/or diagnosis of SARS-CoV-2 by FDA under an Emergency Use Authorization (EUA). This EUA will remain in effect (meaning this test can be used) for the duration of the COVID-19 declaration under Section 564(b)(1) of the Act, 21 U.S.C. section 360bbb-3(b)(1), unless the authorization is terminated or revoked.  Performed at [facility], [place] [address], [place], [place] [address]   Blood Culture (routine x 2)     Status: None   Collection Time: 01/15/21  9:45 AM   Specimen: BLOOD  Result Value Ref Range Status   Specimen Description   Final    BLOOD RIGHT ANTECUBITAL Performed at [facility] [address], [place], [place] [address]    Special Requests   Final    BOTTLES DRAWN AEROBIC AND ANAEROBIC Blood Culture adequate volume Performed at [facility] [address], [place], [place] [address]    Culture   Final    NO GROWTH 5 DAYS Performed at [facility], [place] [address] [place], [place] [address]    Report Status 01/20/2021 FINAL  Final  Blood Culture (routine x 2)     Status: None   Collection Time: 01/15/21 10:00 AM   Specimen: BLOOD  Result Value Ref Range Status   Specimen Description   Final    BLOOD LEFT ANTECUBITAL Performed at [facility] [address], [place], [place] [address]    Special Requests   Final    BOTTLES DRAWN AEROBIC AND ANAEROBIC Blood Culture adequate volume Performed at [place] [address]  [place],  [address]    Culture   Final    NO GROWTH 5 DAYS Performed at [facility], [place] [address] [place],  [address]    Report Status 01/20/2021 FINAL  Final  MRSA Next Gen by PCR, Nasal     Status: None   Collection Time: 01/16/21  3:27 AM   Specimen: Nasal Mucosa; Nasal Swab  Result Value Ref Range Status   MRSA by PCR Next Gen NOT DETECTED NOT DETECTED Final    Comment: (NOTE) The GeneXpert MRSA Assay (FDA approved for NASAL specimens only), is one component of a comprehensive MRSA colonization surveillance program. It is not intended to  diagnose MRSA infection nor to guide or monitor treatment for MRSA infections. Test performance is not FDA approved in patients less than 53 years old. Performed at [facility], [place] [address], [place], [place] [address]   Expectorated Sputum Assessment w Gram Stain, Rflx to Resp Cult     Status: None   Collection Time: 01/16/21  9:04 AM   Specimen: Expectorated Sputum  Result Value Ref Range Status   Specimen Description EXPECTORATED SPUTUM  Final   Special Requests NONE  Final   Sputum evaluation   Final    THIS SPECIMEN IS ACCEPTABLE FOR SPUTUM CULTURE Performed at [facility], [place] [address] [place], [place] [address]    Report Status 01/16/2021 FINAL  Final  Culture, Respiratory w Gram Stain     Status: None   Collection Time: 01/16/21  9:04 AM  Result Value Ref Range Status   Specimen Description   Final    EXPECTORATED SPUTUM Performed at [facility], [place] [address] [place], [place] [address]    Special Requests   Final    NONE Reflexed from [id number] Performed at [facility], [place] [address], [place], [place] [address]    Gram Stain   Final    FEW WBC PRESENT, PREDOMINANTLY PMN FEW GRAM POSITIVE COCCI IN PAIRS RARE GRAM POSITIVE RODS    Culture   Final    MODERATE Normal respiratory flora-no Staph aureus or Pseudomonas  seen Performed at [facility], [address] [place], [place] [address]    Report Status 01/18/2021 FINAL  Final    Please note: You were cared for by a hospitalist during your hospital stay. Once you are discharged, your primary care physician will handle any further medical issues. Please note that NO REFILLS for any discharge medications will be authorized once you are discharged, as it is imperative that you return to your primary care physician (or establish a relationship with a primary care physician if you do not have one) for your post hospital discharge needs so that they can reassess your need for medications and monitor your lab values.    Time coordinating discharge: 40 minutes  SIGNED:   Shelly Coss, MD  [facility] 01/20/2021, 11:49 AM Pager [phone]  If 7PM-7AM, please contact night-coverage [email or web address] Password [email or web address]

## 2021-01-20 NOTE — TOC Transition Note (Addendum)
Transition of Care [facility]) - CM/SW Discharge Note   Patient Details  Name: [name] MRN: [id number] Date of Birth: 1950/06/22  [facility] CM/SW Contact:  Dessa Phi, RN Phone Number: 01/20/2021, 12:12 PM   Clinical Narrative:  d/c back to [place] left vm w/Jeremy-spoke to LaTisha(Dir Resident Care) accepted return-faxed orders,d/c summary,home 02 order to fax#[phone]-going to rm#[id number],tel# for report # [phone]. Awaiting documented 02 sats prior PTAR. -1:28p-02 sats e faxed to [facility]-PTAR called. No further CM needs.    Final next level of care: Assisted Living Barriers to Discharge: No Barriers Identified   Patient Goals and CMS Choice Patient states their goals for this hospitalization and ongoing recovery are:: return back to [facility] CMS Medicare.gov Compare Post Acute Care list provided to:: Patient Choice offered to / list presented to : Patient  Discharge Placement              Patient chooses bed at: Other - please specify in the comment section below: ([facility])   Name of family member notified: Jenny Reichmann sister [phone] Patient and family notified of of transfer: 01/20/21  Discharge Plan and Services   Discharge Planning Services: CM Consult Post Acute Care Choice: Home Health                    HH Arranged: PT [place]: Other - See comment (Facility has own contracted HHPT) Date HH Agency Contacted: 01/20/21 Time [place]: 1211 Representative spoke with at [place]: Angelica own PT  Social Determinants of Health ([place]) Interventions     Readmission Risk Interventions Readmission Risk Prevention Plan 11/09/2019  Transportation Screening Complete  PCP or Specialist Appt within 5-7 Days Complete  Home Care Screening Complete  Medication Review (RN CM) Complete  Some recent data might be hidden

## 2021-01-21 ENCOUNTER — Ambulatory Visit: Payer: Medicare HMO | Admitting: Physician Assistant

## 2021-01-28 ENCOUNTER — Telehealth: Payer: Self-pay | Admitting: Orthopedic Surgery

## 2021-01-28 NOTE — Telephone Encounter (Signed)
Becky informed and given VO

## 2021-01-28 NOTE — Telephone Encounter (Signed)
Becky Reg Director called from [facility] for resumed nursing and PT. Please call Becky at [phone].

## 2021-01-29 ENCOUNTER — Other Ambulatory Visit: Payer: Self-pay

## 2021-01-29 ENCOUNTER — Emergency Department (HOSPITAL_COMMUNITY)
Admission: EM | Admit: 2021-01-29 | Discharge: 2021-02-02 | Disposition: A | Discharge status: Home / Self Care | Payer: Medicare HMO | Attending: Emergency Medicine | Admitting: Emergency Medicine

## 2021-01-29 ENCOUNTER — Emergency Department (HOSPITAL_COMMUNITY): Payer: Medicare HMO

## 2021-01-29 ENCOUNTER — Encounter (HOSPITAL_COMMUNITY): Payer: Self-pay | Admitting: Emergency Medicine

## 2021-01-29 DIAGNOSIS — Z79899 Other long term (current) drug therapy: Secondary | ICD-10-CM | POA: Insufficient documentation

## 2021-01-29 DIAGNOSIS — E039 Hypothyroidism, unspecified: Secondary | ICD-10-CM | POA: Insufficient documentation

## 2021-01-29 DIAGNOSIS — Z7901 Long term (current) use of anticoagulants: Secondary | ICD-10-CM | POA: Diagnosis not present

## 2021-01-29 DIAGNOSIS — Z96652 Presence of left artificial knee joint: Secondary | ICD-10-CM | POA: Insufficient documentation

## 2021-01-29 DIAGNOSIS — R339 Retention of urine, unspecified: Secondary | ICD-10-CM

## 2021-01-29 DIAGNOSIS — N5089 Other specified disorders of the male genital organs: Secondary | ICD-10-CM | POA: Insufficient documentation

## 2021-01-29 DIAGNOSIS — Z85038 Personal history of other malignant neoplasm of large intestine: Secondary | ICD-10-CM | POA: Insufficient documentation

## 2021-01-29 DIAGNOSIS — R531 Weakness: Secondary | ICD-10-CM | POA: Insufficient documentation

## 2021-01-29 DIAGNOSIS — I5032 Chronic diastolic (congestive) heart failure: Secondary | ICD-10-CM | POA: Diagnosis not present

## 2021-01-29 DIAGNOSIS — Z20822 Contact with and (suspected) exposure to covid-19: Secondary | ICD-10-CM | POA: Insufficient documentation

## 2021-01-29 DIAGNOSIS — I4819 Other persistent atrial fibrillation: Secondary | ICD-10-CM | POA: Insufficient documentation

## 2021-01-29 LAB — CBC WITH DIFFERENTIAL/PLATELET
Abs Immature Granulocytes: 0.03 10*3/uL (ref 0.00–0.07)
Basophils Absolute: 0 10*3/uL (ref 0.0–0.1)
Basophils Relative: 0 %
Eosinophils Absolute: 0.1 10*3/uL (ref 0.0–0.5)
Eosinophils Relative: 2 %
HCT: 37.7 % — ABNORMAL LOW (ref 39.0–52.0)
Hemoglobin: 11.2 g/dL — ABNORMAL LOW (ref 13.0–17.0)
Immature Granulocytes: 1 %
Lymphocytes Relative: 12 %
Lymphs Abs: 0.6 10*3/uL — ABNORMAL LOW (ref 0.7–4.0)
MCH: 30.4 pg (ref 26.0–34.0)
MCHC: 29.7 g/dL — ABNORMAL LOW (ref 30.0–36.0)
MCV: 102.2 fL — ABNORMAL HIGH (ref 80.0–100.0)
Monocytes Absolute: 0.6 10*3/uL (ref 0.1–1.0)
Monocytes Relative: 11 %
Neutro Abs: 3.9 10*3/uL (ref 1.7–7.7)
Neutrophils Relative %: 74 %
Platelets: 208 10*3/uL (ref 150–400)
RBC: 3.69 MIL/uL — ABNORMAL LOW (ref 4.22–5.81)
RDW: 17 % — ABNORMAL HIGH (ref 11.5–15.5)
WBC: 5.2 10*3/uL (ref 4.0–10.5)
nRBC: 0 % (ref 0.0–0.2)

## 2021-01-29 LAB — BASIC METABOLIC PANEL
Anion gap: 6 (ref 5–15)
BUN: 16 mg/dL (ref 8–23)
CO2: 32 mmol/L (ref 22–32)
Calcium: 9.1 mg/dL (ref 8.9–10.3)
Chloride: 105 mmol/L (ref 98–111)
Creatinine, Ser: 1.05 mg/dL (ref 0.61–1.24)
GFR, Estimated: >60 mL/min (ref >60)
Glucose, Bld: 116 mg/dL — ABNORMAL HIGH (ref 70–99)
Potassium: 4.2 mmol/L (ref 3.5–5.1)
Sodium: 143 mmol/L (ref 135–145)

## 2021-01-29 LAB — PROTIME-INR
INR: 2.2 — ABNORMAL HIGH (ref 0.8–1.2)
Prothrombin Time: 24.1 seconds — ABNORMAL HIGH (ref 11.4–15.2)

## 2021-01-29 NOTE — ED Notes (Signed)
Patient sister called for an update her call back is : Jenny Reichmann [phone]

## 2021-01-29 NOTE — ED Triage Notes (Signed)
Pt BIBA.  Per EMS pt from [facility] with c/o of testicular inflammation x 3 days with bleeding beginning today.EMS reports pt has been in the wheelchair for a week due to staff not being able to transfer him into the bed.   108/48 99% room air Temp 97.6  Cbg 115 RR 22 HR 76 - hx of A-fib.

## 2021-01-29 NOTE — ED Provider Notes (Signed)
[name] DEPT Provider Note   CSN: [id number] Arrival date & time: 01/29/21  1320     History Chief Complaint  Patient presents with   Groin Swelling    [name] is a 70 y.o. male.  HPI    70 year old male comes in with chief complaint of groin swelling.  [facility].  He has history of CHF, A. fib on anticoagulation.  Patient reports 3-day history of groin swelling.  He indicates that there is no pain in his testicle unless someone is mashing on it.  He was diagnosed with COVID-19 recently and admitted to [facility].  They have not been able to move him, and because of his weakness he is not been able to move either.  He has been sitting on a wheelchair the last few days, they are awaiting for a hospital bed to arrive.  Past Medical History:  Diagnosis Date   CHF (congestive heart failure) ([place]) 05/2019   Chronic anticoagulation    with Xarelto   Complication of anesthesia    PT STATES HARD TO WAKE UP AFTER ONE SUGERY -STATES THE SURGERY TOOK LONGER THAN EXPECTED.  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   Shortness of breath    WITH EXERTION AND PAIN   Sleep apnea    uses CPAP,    Patient Active Problem List   Diagnosis Date Noted   Acute respiratory failure with hypoxia ([place]) 01/19/2021   Hypothyroidism    Macrocytic anemia    Morbid obesity with BMI of 60.0-69.9, adult ([place])    Pneumonia due to COVID-19 virus 01/15/2021   Cellulitis of left leg    Infection of above knee amputation stump  ([place])    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   Septic joint of left knee joint ([place]) 11/06/2019   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   Open knee wound 08/14/2019   S/P left TK revision 04/03/2019   Administration of long-term prophylactic antibiotics    History of streptococcal infection    History of DVT (deep vein thrombosis)    S/P rev left TK 12/15/2015   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   Preoperative cardiovascular examination 04/17/2013   OSA on CPAP 11/27/2012   Chronic diastolic heart failure ([place]) 09/10/2012   Chronic anticoagulation, with Xarelto 09/10/2012   DVT (deep venous thrombosis), possible 09/10/2012   SOB (shortness of breath) 08/26/2012   Chest discomfort 08/26/2012   Persistent atrial fibrillation ([place]) 08/26/2012   Super obesity 06/27/2012   Expected blood loss anemia 06/27/2012   S/P left TK revision 06/23/2012   Septic arthritis of knee ([place])    Cellulitis 09/19/2010   METHICILLIN RESISTANT STAPHYLOCOCCUS AUREUS INFECTION 09/10/2009   STREPTOCOCCUS INFECTION CCE & UNS SITE GROUP C 07/04/2008   DM 07/04/2008   CHRONIC KIDNEY DISEASE UNSPECIFIED 07/04/2008   PULMONARY EMBOLISM, HX OF 07/04/2008   INFECTION DUE TO INTERNAL ORTH DEVICE  [place] 03/02/2006    Past Surgical History:  Procedure Laterality Date   2010 REMOVAL OF LEFT TOTAL KNEE     AMPUTATION Left 11/17/2019   Procedure: AMPUTATION ABOVE KNEE;  Surgeon: Newt Minion, MD;  Location: [place];  Service: Orthopedics;  Laterality: Left;   CARDIAC CATHETERIZATION  2006   CARDIOVERSION N/A 08/28/2012   Procedure: CARDIOVERSION;  Surgeon: Sanda Klein, MD;  Location: [place];  Service: Cardiovascular;  Laterality: N/A;   CARDIOVERSION N/A 09/01/2012   Procedure: CARDIOVERSION;  Surgeon: Pixie Casino, MD;  Location: [place];  Service: Cardiovascular;  Laterality: N/A;   COLONOSCOPY N/A 06/13/2013   Procedure: COLONOSCOPY;  Surgeon: Inda Castle, MD;  [facility];  Service: Endoscopy;  Laterality: N/A;   EXCISIONAL TOTAL KNEE ARTHROPLASTY WITH ANTIBIOTIC SPACERS Left 09/21/2018   Procedure: Resection of tibia versus both components with placement of antibiotic spacer;  Surgeon: Paralee Cancel, MD;  [facility];  Service: Orthopedics;  Laterality: Left;  2.5 hrs   EXCISIONAL TOTAL KNEE ARTHROPLASTY WITH ANTIBIOTIC SPACERS Left 01/02/2019   Procedure: Repeat Washout and placement of antibiotic spacer left knee;  Surgeon: Paralee Cancel, MD;  [facility];  Service: Orthopedics;  Laterality: Left;  2 hrs   HYDROCELECTOY   2012   I & D EXTREMITY Left 08/14/2019   Procedure: IRRIGATION AND DEBRIDEMENT LEFT KNEE WOUND;  Surgeon: Paralee Cancel, MD;  [facility];  Service: Orthopedics;  Laterality: Left;  60 mins   I & D KNEE WITH POLY EXCHANGE Left 05/17/2019   Procedure: IRRIGATION AND DEBRIDEMENT KNEE WITH POLY EXCHANGE;  Surgeon: Paralee Cancel, MD;  [facility];  Service: Orthopedics;  Laterality: Left;  90 mins   IRRIGATION AND DEBRIDEMENT KNEE Left 04/03/2019   Procedure: REIMPLANTATION OF LEFT KNEE TIBIAL COMPONENT.;  Surgeon: Paralee Cancel, MD;  [facility];  Service: Orthopedics;  Laterality: Left;  need 120 min   IRRIGATION AND DEBRIDEMENT KNEE Left 11/06/2019   Procedure: IRRIGATION AND DEBRIDEMENT KNEE;  Surgeon: Paralee Cancel, MD;  [facility];  Service: Orthopedics;  Laterality: Left;  90 mins   LEFT KNEE ARTHROSCOPY  1999   LEFT KNEE SURGERY UPPER TIBIAL OSTOMY     LEFT TOTAL KNEE REMOVAL FOR INFECTION  2006   REIMPLANTATION LEFT TOTAL KNEE  2008   REIMPLANTATION LEFT TOTAL KNEE   2006   REIMPLANTATION OF TOTAL KNEE Left 06/23/2012   Procedure: REIMPLANTATION OF LEFT TOTAL KNEE;  Surgeon: Mauri Pole, MD;  [facility];  Service: Orthopedics;  Laterality: Left;   REMOVAL LEFT TOTAL KNEE   2008   REPLACEMENT LEFT KNEE  2002   REPLACEMENT RIGHT KNEE  2003   REVISION LEFT KNEE CAP   2004   RIGHT KNEE  ARTHROSCOPY  1998   TEE WITHOUT CARDIOVERSION N/A 08/28/2012   Procedure: TRANSESOPHAGEAL ECHOCARDIOGRAM (TEE);  Surgeon: Sanda Klein, MD;  Location: [place];  Service: Cardiovascular;  Laterality: N/A;   TONSILLECTOMY     TOTAL KNEE REVISION Left 12/15/2015   Procedure: TOTAL KNEE REVISION REPLACEMENT;  Surgeon: Paralee Cancel, MD;  [facility];  Service: Orthopedics;  Laterality: Left;  Adductor Block       Family History  Problem Relation Age of Onset   Heart disease Father 49   Pancreatic cancer Sister    Liver cancer Sister    Cervical cancer Sister    Breast cancer Sister    Diabetes Sister    Colon cancer Neg Hx  Throat cancer Neg Hx    Stomach cancer Neg Hx    Kidney disease Neg Hx    Liver disease Neg Hx     Social History   Tobacco Use   Smoking status: Never   Smokeless tobacco: Never  Vaping Use   Vaping Use: Never used  Substance Use Topics   Alcohol use: Not Currently    Comment: not since 1999. However, had around 6 drinks a day between 1993-1999, roughly.   Drug use: No    Home Medications Prior to Admission medications   Medication Sig Start Date End Date Taking? Authorizing Provider  acetaminophen (TYLENOL) 500 MG tablet Take 500 mg by mouth 3 (three) times daily as needed for mild pain or fever.    [provider]  albuterol (VENTOLIN HFA) 108 (90 Base) MCG/ACT inhaler Inhale 2 puffs into the lungs every 6 (six) hours as needed for wheezing or shortness of breath. 01/20/21   Shelly Coss, MD  CAPSAICIN EX Apply 1 application topically 3 (three) times daily as needed (Phantom Limb Pain). 0.025%    [provider]  diltiazem (CARDIZEM CD) 240 MG 24 hr capsule TAKE 1 CAPSULE BY MOUTH DAILY Patient taking differently: Take 240 mg by mouth daily. 10/10/18   Croitoru, Mihai, MD  ferrous sulfate (FERROUSUL) 325 (65 FE) MG tablet Take 1 tablet (325 mg total) by mouth 3 (three) times daily with meals for 14 days. Patient not taking:  Reported on 01/15/2021 08/16/19 10/30/20  Danae Orleans, PA-C  furosemide (LASIX) 20 MG tablet Take 20 mg by mouth daily. 07/28/16   [provider]  gabapentin (NEURONTIN) 800 MG tablet Take 800 mg by mouth 3 (three) times daily. 07/21/20   [provider]  guaiFENesin-dextromethorphan (ROBITUSSIN DM) 100-10 MG/5ML syrup Take 10 mLs by mouth every 4 (four) hours as needed for cough. 01/20/21   Shelly Coss, MD  levothyroxine (SYNTHROID) 125 MCG tablet Take 125 mcg by mouth daily before breakfast.  10/31/15   [provider]  melatonin 5 MG TABS Take 10 mg by mouth at bedtime.    [provider]  methocarbamol (ROBAXIN) 500 MG tablet Take 1,000 mg by mouth 4 (four) times daily as needed for muscle spasms. 12/10/20   [provider]  metoprolol tartrate (LOPRESSOR) 50 MG tablet TAKE 1 TABLET BY MOUTH TWICE DAILY Patient taking differently: Take 50 mg by mouth 2 (two) times daily. 12/12/18   Croitoru, Mihai, MD  Multiple Vitamin (MULTIVITAMIN WITH MINERALS) TABS Take 1 tablet by mouth daily.    [provider]  naphazoline-glycerin (CLEAR EYES REDNESS) 0.012-0.2 % SOLN Place 1-2 drops into both eyes 4 (four) times daily as needed for eye irritation. 11/21/19   Persons, Bevely Palmer, PA  oxycodone (OXY-IR) 5 MG capsule Take 5 mg by mouth every 6 (six) hours as needed for pain.    [provider]  potassium chloride (KLOR-CON M) 10 MEQ tablet Take 10 mEq by mouth daily. 01/06/21   [provider]  vitamin B-12 (CYANOCOBALAMIN) 1000 MCG tablet Take 1,000 mcg by mouth daily.    [provider]  XARELTO 20 MG TABS tablet TAKE 1 TABLET BY MOUTH DAILY WITH BREAKFAST Patient taking differently: Take 20 mg by mouth daily. 10/08/19   Croitoru, Mihai, MD    Allergies    Morphine  Review of Systems   Review of Systems  Constitutional:  Positive for activity change.  Genitourinary:  Positive for scrotal swelling.  All other systems  reviewed  and are negative.  Physical Exam Updated Vital Signs BP 120/65    Pulse 71    Temp (!) 97.5 F (36.4 C) (Oral)    Resp 16    Ht 5\' 8"  (1.727 m)    Wt (!) 199.6 kg    SpO2 100%    BMI 66.90 kg/m   Physical Exam Vitals and nursing note reviewed.  Constitutional:      Appearance: He is well-developed.  HENT:     Head: Atraumatic.  Cardiovascular:     Rate and Rhythm: Normal rate.  Pulmonary:     Effort: Pulmonary effort is normal.  Genitourinary:    Comments: Bilateral scrotal swelling, with mild erythema, no warmth to touch Musculoskeletal:     Cervical back: Neck supple.  Skin:    General: Skin is warm.  Neurological:     Mental Status: He is alert and oriented to person, place, and time.    ED Results / Procedures / Treatments   Labs (all labs ordered are listed, but only abnormal results are displayed) Labs Reviewed  BASIC METABOLIC PANEL - Abnormal; Notable for the following components:      Result Value   Glucose, Bld 116 (*)    All other components within normal limits  CBC WITH DIFFERENTIAL/PLATELET - Abnormal; Notable for the following components:   RBC 3.69 (*)    Hemoglobin 11.2 (*)    HCT 37.7 (*)    MCV 102.2 (*)    MCHC 29.7 (*)    RDW 17.0 (*)    Lymphs Abs 0.6 (*)    All other components within normal limits  PROTIME-INR - Abnormal; Notable for the following components:   Prothrombin Time 24.1 (*)    INR 2.2 (*)    All other components within normal limits    EKG None  Radiology No results found.  Procedures Procedures   Medications Ordered in ED Medications - No data to display  ED Course  I have reviewed the triage vital signs and the nursing notes.  Pertinent labs & imaging results that were available during my care of the patient were reviewed by me and considered in my medical decision making (see chart for details).    MDM Rules/Calculators/A&P                          70 year old male with history of CHF, permanent  A. fib on blood thinners who recently had COVID-19 and has been transferred to assisted living comes in with chief complaint of scrotal swelling.  AllegedlY the facility is not transferring him -and patient is not strong enough to pivot in and out of bed himself.  He has been on the wheelchair for the last few days.  Unsure what status he is in, but I suspect that the scrotal swelling is because of him sitting the entire time.  I have consulted social work to see if they can elevate his level of care at the facility.  Ultrasound ordered.  Dr. Rogene Houston to follow-up     Final Clinical Impression(s) / ED Diagnoses Final diagnoses:  None    Rx / DC Orders ED Discharge Orders     None        Varney Biles, MD 01/29/21 1707

## 2021-01-29 NOTE — Progress Notes (Signed)
Transition of Care [facility]) - Emergency Department Mini Assessment   Patient Details  Name: [name] MRN: [id number] Date of Birth: 09/09/1950  Transition of Care [facility]) CM/SW Contact:    Gaetano Hawthorne Tarpley-Carter, [place] Phone Number: 01/29/2021, 6:19 PM   Clinical Narrative: TOC CSW spoke with Blessings/Med Tech at [facility].  CSW left a message for RN to contact CSW tonight or tomorrow morning in regards to pts return.  Imanni Burdine Tarpley-Carter, MSW, LCSW-A Pronouns:  She/Her/Hers [place] Transitions of Care Clinical Social Worker Direct Number:  [phone] [email or web address]  ED Mini Assessment: What brought you to the Emergency Department? : Scrotum Swelling  Barriers to Discharge: No Barriers Identified     Means of departure: Ambulance  Interventions which prevented an admission or readmission: SNF Placement    Patient Contact and Communications Key Contact 1: Blessings   Spoke with: Blessings Contact Date: 01/29/21,     Contact Phone Number: [phone] Call outcome: Left message for RN to call back.  Patient states their goals for this hospitalization and ongoing recovery are:: Question if [facility] can care for pt.   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   Open knee wound 08/14/2019   S/P left TK revision 04/03/2019   Administration of long-term prophylactic antibiotics    History of  streptococcal infection    History of DVT (deep vein thrombosis)    S/P rev left TK 12/15/2015   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   Preoperative cardiovascular examination 04/17/2013   OSA on CPAP 11/27/2012   Chronic diastolic heart failure ([place]) 09/10/2012   Chronic anticoagulation, with Xarelto 09/10/2012   DVT (deep venous thrombosis), possible 09/10/2012   SOB (shortness of breath) 08/26/2012   Chest discomfort 08/26/2012   Persistent atrial fibrillation ([place]) 08/26/2012   Super obesity 06/27/2012   Expected blood loss anemia 06/27/2012   S/P left TK revision 06/23/2012   Septic arthritis of knee ([place])    Cellulitis 09/19/2010   METHICILLIN RESISTANT STAPHYLOCOCCUS AUREUS INFECTION 09/10/2009   STREPTOCOCCUS INFECTION CCE & UNS SITE GROUP C 07/04/2008   DM 07/04/2008   CHRONIC KIDNEY DISEASE UNSPECIFIED 07/04/2008   PULMONARY EMBOLISM, HX OF 07/04/2008   INFECTION DUE TO INTERNAL ORTH DEVICE N[facility] 03/02/2006   PCP:  Bernerd Limbo, MD Pharmacy:   [facility], [place] - [address] [place] [address] Phone: [phone] Fax: [phone]  [facility] [address] [place] [address] Phone: [phone] Fax: [phone]

## 2021-01-29 NOTE — ED Provider Notes (Addendum)
Work-up here lab wise without any significant findings.  Patient's renal functions normal.  Patient states she is having difficulty urinating due to the swelling.  We will go ahead and place Foley catheter.  Also will discuss ultrasound findings with urology.  But am not sure that any of that requires any kind of acute intervention.  Social worker was in contact with [facility].  But they were not able to get any definitive answers on his care there.  So she is implied that they are going to follow-up with that in the morning.  So we will keep the patient here tonight.   Fredia Sorrow, MD 01/29/21 2210   Patient ultrasound results discussed with Dr. Abner Greenspan from urology.  Nothing acute to be done with that.  He can follow-up outpatient with urology.  Patient's renal functions fine here but patient stating he has not been able to pee.  So we will put Foley catheter and we got 900 cc out.  Urine analysis is pending.  Patient will be ready dressed by the social worker in the morning.  Patient will need to keep his Foley catheter in place since he had bladder distention.  But this does not admission.  Patient's labs otherwise without any significant abnormalities no leukocytosis.  No electrolyte abnormalities.   Fredia Sorrow, MD 01/29/21 [phone]

## 2021-01-30 LAB — URINALYSIS, ROUTINE W REFLEX MICROSCOPIC
Bacteria, UA: NONE SEEN
Bilirubin Urine: NEGATIVE
Glucose, UA: NEGATIVE mg/dL
Hgb urine dipstick: NEGATIVE
Ketones, ur: NEGATIVE mg/dL
Leukocytes,Ua: NEGATIVE
Nitrite: NEGATIVE
Protein, ur: NEGATIVE mg/dL
Specific Gravity, Urine: 1.02 (ref 1.005–1.030)
pH: 6 (ref 5.0–8.0)

## 2021-01-30 MED ORDER — ADULT MULTIVITAMIN W/MINERALS CH
1.0000 | ORAL_TABLET | Freq: Every day | ORAL | Status: DC
  Administered 2021-01-30 – 2021-02-02 (×4): 1 via ORAL
  Filled 2021-01-30 (×4): qty 1

## 2021-01-30 MED ORDER — POTASSIUM CHLORIDE CRYS ER 10 MEQ PO TBCR
10.0000 meq | EXTENDED_RELEASE_TABLET | Freq: Every day | ORAL | Status: DC
Start: 2021-01-30 — End: 2021-02-02
  Administered 2021-01-30 – 2021-02-02 (×4): 10 meq via ORAL
  Filled 2021-01-30 (×4): qty 1

## 2021-01-30 MED ORDER — VITAMIN B-12 1000 MCG PO TABS
1000.0000 ug | ORAL_TABLET | Freq: Every day | ORAL | Status: DC
  Administered 2021-01-30 – 2021-02-02 (×4): 1000 ug via ORAL
  Filled 2021-01-30 (×4): qty 1

## 2021-01-30 MED ORDER — ALBUTEROL SULFATE HFA 108 (90 BASE) MCG/ACT IN AERS: 2.0000 | INHALATION_SPRAY | Freq: Four times a day (QID) | RESPIRATORY_TRACT | Status: DC | PRN

## 2021-01-30 MED ORDER — METOPROLOL TARTRATE 25 MG PO TABS
50.0000 mg | ORAL_TABLET | Freq: Two times a day (BID) | ORAL | Status: DC
  Administered 2021-01-30 – 2021-02-02 (×6): 50 mg via ORAL
  Filled 2021-01-30 (×6): qty 2

## 2021-01-30 MED ORDER — ACETAMINOPHEN 500 MG PO TABS: 500.0000 mg | ORAL_TABLET | Freq: Three times a day (TID) | ORAL | Status: DC | PRN

## 2021-01-30 MED ORDER — MELATONIN 5 MG PO TABS: 10.0000 mg | ORAL_TABLET | Freq: Every day | ORAL | Status: DC

## 2021-01-30 MED ORDER — LEVOTHYROXINE SODIUM 25 MCG PO TABS
125.0000 ug | ORAL_TABLET | Freq: Every day | ORAL | Status: DC
  Administered 2021-01-31 – 2021-02-02 (×3): 125 ug via ORAL
  Filled 2021-01-30 (×3): qty 1

## 2021-01-30 MED ORDER — NAPHAZOLINE-GLYCERIN 0.012-0.25 % OP SOLN
2.0000 [drp] | Freq: Four times a day (QID) | OPHTHALMIC | Status: DC | PRN
  Filled 2021-01-30 (×2): qty 15

## 2021-01-30 MED ORDER — ASCORBIC ACID 500 MG PO TABS
500.0000 mg | ORAL_TABLET | Freq: Every day | ORAL | Status: DC
  Administered 2021-01-30 – 2021-02-02 (×4): 500 mg via ORAL
  Filled 2021-01-30 (×4): qty 1

## 2021-01-30 MED ORDER — CLOTRIMAZOLE 1 % EX CREA
1.0000 "application " | TOPICAL_CREAM | Freq: Two times a day (BID) | CUTANEOUS | Status: DC
  Administered 2021-01-31 – 2021-02-01 (×2): 1 via TOPICAL
  Filled 2021-01-30: qty 15

## 2021-01-30 MED ORDER — FERROUS SULFATE 325 (65 FE) MG PO TABS
325.0000 mg | ORAL_TABLET | Freq: Three times a day (TID) | ORAL | Status: DC
  Administered 2021-01-31 – 2021-02-02 (×5): 325 mg via ORAL
  Filled 2021-01-30 (×6): qty 1

## 2021-01-30 MED ORDER — RIVAROXABAN 20 MG PO TABS
20.0000 mg | ORAL_TABLET | Freq: Every day | ORAL | Status: DC
  Administered 2021-01-30 – 2021-02-01 (×3): 20 mg via ORAL
  Filled 2021-01-30 (×4): qty 1

## 2021-01-30 MED ORDER — CAPSAICIN 0.025 % EX CREA
TOPICAL_CREAM | Freq: Three times a day (TID) | CUTANEOUS | Status: DC | PRN
  Filled 2021-01-30 (×2): qty 60

## 2021-01-30 MED ORDER — FUROSEMIDE 20 MG PO TABS
20.0000 mg | ORAL_TABLET | Freq: Every day | ORAL | Status: DC
  Administered 2021-01-30 – 2021-02-02 (×4): 20 mg via ORAL
  Filled 2021-01-30 (×4): qty 1

## 2021-01-30 MED ORDER — OXYCODONE HCL 5 MG PO TABS
5.0000 mg | ORAL_TABLET | Freq: Four times a day (QID) | ORAL | Status: DC | PRN
  Administered 2021-01-30 – 2021-02-02 (×3): 5 mg via ORAL
  Filled 2021-01-30 (×4): qty 1

## 2021-01-30 MED ORDER — ZINC SULFATE 220 (50 ZN) MG PO CAPS
220.0000 mg | ORAL_CAPSULE | Freq: Every day | ORAL | Status: DC
  Administered 2021-01-30 – 2021-02-02 (×4): 220 mg via ORAL
  Filled 2021-01-30 (×4): qty 1

## 2021-01-30 MED ORDER — GABAPENTIN 400 MG PO CAPS
800.0000 mg | ORAL_CAPSULE | Freq: Three times a day (TID) | ORAL | Status: DC
  Administered 2021-01-30 – 2021-02-02 (×8): 800 mg via ORAL
  Filled 2021-01-30 (×8): qty 2

## 2021-01-30 MED ORDER — DILTIAZEM HCL ER COATED BEADS 120 MG PO CP24
240.0000 mg | ORAL_CAPSULE | Freq: Every day | ORAL | Status: DC
  Administered 2021-01-30 – 2021-02-02 (×4): 240 mg via ORAL
  Filled 2021-01-30 (×4): qty 2

## 2021-01-30 NOTE — ED Notes (Signed)
Report called to [facility] [phone]), given to [facility], Med tech.  All questions answered.  Social work to arrange [employer] transport back to [facility].

## 2021-01-30 NOTE — Progress Notes (Signed)
CSW spoke with [place]  from [facility] they are requesting pt receive PT eval due to the increase weakness.She stated pt was able to transfer from bed to wheel chair on his own two weeks ago, prior to his COVID diagnosis. Kristeen Miss RN stated pt is in need of Physical Therapy.   Arlie Solomons.Fortune Brannigan, MSW, [place]   Transitions of Care Clinical Social Worker I Direct Dial: [phone]   Fax: [phone] [email or web address]

## 2021-01-30 NOTE — Discharge Instructions (Signed)
Follow-up with urology next week

## 2021-01-30 NOTE — Progress Notes (Signed)
CSW spoke with [place] from [facility] , she reports pt needs higher level of care she reports [facility] PT is not enough to have pt to return. She stated the facility is unable to assist pt with ADL since he is unable to bear weight on his leg.  they are requesting pt go to SNF for rehab, then pt can return to ALF. According to pt's chart pt will need hospital bed for PT eval. TOC continue to follow.  Arlie Solomons.Rosaleah Person, MSW, [place]   Transitions of Care Clinical Social Worker I Direct Dial: [phone]   Fax: [phone] [email or web address]

## 2021-01-30 NOTE — ED Provider Notes (Signed)
Emergency Medicine Provider OB Triage Evaluation Note  [name] is a 70 y.o. male, No obstetric history on file., at Unknown gestation who presents to the emergency department with complaints of scrotal pain and swelling. Recently admitted for COVID-19 and discharged to a rehab facility.  Review of  Systems  Positive: Scrotal pain, urinary retention Negative: Fevers  Physical Exam  BP 110/71    Pulse 73    Temp (!) 97.5 F (36.4 C) (Oral)    Resp (!) 25    Ht 5\' 8"  (1.727 m)    Wt (!) 199.6 kg    SpO2 97%    BMI 66.90 kg/m  General: Awake, no distress  HEENT: Atraumatic  Resp: Normal effort  Cardiac: Normal rate Abd: Nondistended, nontender  MSK: Moves all extremities without difficulty Neuro: Speech clear  Medical Decision Making   Patient had an ultrasound that revealed complex cyst.  Patient also has hydrocele.  But more importantly, patient had urinary retention and is unable to be taken care of at the [facility] at the capacity he needs help.  Social work to follow-up on disposition.   Clinical Impression   1. Scrotal swelling        Varney Biles, MD 01/30/21 1046

## 2021-01-30 NOTE — Evaluation (Signed)
Physical Therapy Evaluation Patient Details Name: [name] MRN: [id number] DOB: 23-Jul-1950 Today's Date: 01/30/2021  History of Present Illness  70 yo male admitted from ALF with scrotal pain and swelling. 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  Clinical Impression  Limited bed eval in ED. Max Assist +2 for partial roll in gurney bed. Due to pt's body habitus and limited space available on gurney, unable to fully assess pt's mobility for risk of harm/injury to patient and/or therapy staff. Requested pt be moved to hospital bed to allow for safe mobilization however this request was denied. Spoke at length with pt about his mobility status prior to this admission. Pt reports he was able to pivot into his wheelchair, at his ALF, however he was unable to get himself back to bed. Staff at the ALF were also unable to assist pt out of his wheelchair. At this time, PT recommendation is for ST rehab at SNF to improve strength, functional mobility and for pt to regain independence prior to returning to his ALF.      Recommendations for follow up therapy are one component of a multi-disciplinary discharge planning process, led by the attending physician.  Recommendations may be updated based on patient status, additional functional criteria and insurance authorization.  Follow Up Recommendations Skilled nursing-short term rehab (<3 hours/day)    Assistance Recommended at Discharge Frequent or constant Supervision/Assistance  Functional Status Assessment Patient has had a recent decline in their functional status and demonstrates the ability to make significant improvements in function in a reasonable and predictable amount of time.  Equipment Recommendations  None recommended by PT    Recommendations for Other Services       Precautions / Restrictions Precautions Precautions: Fall Precaution Comments: L AKA 11/17/19 he does not wear his leg (too  heavy) Restrictions Weight Bearing Restrictions: No      Mobility  Bed Mobility Overal bed mobility: Needs Assistance Bed Mobility: Rolling Rolling: Max assist;+2 for physical assistance;+2 for safety/equipment         General bed mobility comments: Only able to partially roll to L 2* body habitus and limited space on gurney. Trendelenburg + maxi slide sheet to position pt up towards HOB. Pt was then able to help pull trunk forward by using side rails for [facility] positioning on gurney    Transfers                   General transfer comment: NT-unable to safely attempt from ED gurney 2* risk of harm to patient and/or staff    Ambulation/Gait                  Stairs            Wheelchair Mobility    Modified Rankin (Stroke Patients Only)       Balance                                             Pertinent Vitals/Pain Pain Assessment: 0-10 Pain Score: 5  Pain Location: scrotal area Pain Descriptors / Indicators: Grimacing;Discomfort;Tender Pain Intervention(s): Limited activity within patient's tolerance;Monitored during session    Home Living Family/patient expects to be discharged to:: Unsure     Type of Home: Assisted living Home Access: Level entry       Home Layout: One level Home Equipment:  Rollator (4 wheels);Rolling Walker (2 wheels);Wheelchair - manual;Cane - single point;Grab bars - toilet Additional Comments: pt reports  he "bird baths" at sink and uses a BSC    Prior Function               Mobility Comments: Prior to last admission/discharge (01/19/2021), pt was Mod Ind with transfers to/from WC/commode. Non ambulatory. ADLs Comments: Prior to last admission/discharge (01/19/2021), patient was able to complete birth baths, toileting and dressing tasks MI at wheelchair level.     Hand Dominance        Extremity/Trunk Assessment   Upper Extremity Assessment Upper Extremity Assessment: Overall WFL for  tasks assessed    Lower Extremity Assessment Lower Extremity Assessment: RLE deficits/detail RLE Deficits / Details: difficult to accurately assess 2* limited area to mobilize on gurney. hip flex 2/5. moves ankle LLE Deficits / Details: L AKA-hip flexion 3-/5.       Communication   Communication: No difficulties  Cognition Arousal/Alertness: Awake/alert Behavior During Therapy: WFL for tasks assessed/performed Overall Cognitive Status: Within Functional Limits for tasks assessed                                 General Comments: AxO x 3 very pleasant        General Comments      Exercises     Assessment/Plan    PT Assessment Patient needs continued PT services  PT Problem List Decreased strength;Decreased mobility;Decreased range of motion;Decreased activity tolerance;Decreased balance;Decreased knowledge of use of DME;Impaired sensation;Obesity;Pain       PT Treatment Interventions DME instruction;Therapeutic activities;Therapeutic exercise;Patient/family education;Balance training;Functional mobility training    PT Goals (Current goals can be found in the Care Plan section)  Acute Rehab PT Goals Patient Stated Goal: Regain IND PT Goal Formulation: With patient Time For Goal Achievement: 02/13/21 Potential to Achieve Goals: Fair    Frequency Min 2X/week   Barriers to discharge Decreased caregiver support      Co-evaluation               AM-PAC PT "6 Clicks" Mobility  Outcome Measure Help needed turning from your back to your side while in a flat bed without using bedrails?: Total Help needed moving from lying on your back to sitting on the side of a flat bed without using bedrails?: Total Help needed moving to and from a bed to a chair (including a wheelchair)?: Total Help needed standing up from a chair using your arms (e.g., wheelchair or bedside chair)?: Total Help needed to walk in hospital room?: Total Help needed climbing 3-5 steps  with a railing? : Total 6 Click Score: 6    End of Session   Activity Tolerance: Patient limited by pain Patient left: in bed;with call bell/phone within reach   PT Visit Diagnosis: Muscle weakness (generalized) (M62.81);History of falling (Z91.81);Other abnormalities of gait and mobility (R26.89);Pain    Time: 1445-1525 PT Time Calculation (min) (ACUTE ONLY): 40 min   Charges:   PT Evaluation $PT Eval Moderate Complexity: 1 Mod PT Treatments $Therapeutic Activity: 23-37 mins           Doreatha Massed, PT Acute Rehabilitation  Office: [phone] Pager: [phone]

## 2021-01-30 NOTE — ED Notes (Signed)
Pt's friend in room w/ pt asking for update.  ED RN gave update to friend and pt.    Denman GeorgeCharolette Child [phone]

## 2021-01-30 NOTE — Progress Notes (Addendum)
PT Cancellation Note  Patient Details Name: [name] MRN: [id number] DOB: 1951-01-13   Cancelled Treatment:    Reason Eval/Treat Not Completed:  Order received. Chart reviewed. Unfortunatly, due to patient's size and current mobility status, I do not feel it will be safe to attempt to mobilize him from a gurney. Contacted RN in secure chat. Will hold PT for now.  Please let us know once he is on a hospital bed and we will attempt PT evaluation. Thanks.     [place] [facility]  Office: [phone] Pager: [phone]

## 2021-01-30 NOTE — Progress Notes (Addendum)
TOC CSW spoke with [facility], pt can return back to facility. Call report Kristeen Miss  [phone]. MD and RN aware.  Arlie Solomons.Rome Schlauch, MSW, [place]   Transitions of Care Clinical Social Worker I Direct Dial: [phone]   Fax: [phone] [email or web address]

## 2021-01-30 NOTE — TOC Progression Note (Signed)
Transition of Care [facility]) - Progression Note    Patient Details  Name: [name] MRN: [id number] Date of Birth: November 03, 1950  Transition of Care (TOC) CM Contact  Roseanne Kaufman, RN Phone Number: 01/30/2021, 12:31 PM  Clinical Narrative:     RNCM was notified need for HHC/PT. RNCM coordinated with [name] at [place] who has accepted [facility] PT services. TOC team will continue to follow.     Barriers to Discharge: No Barriers Identified  Expected Discharge Plan and Services  Discharge to [facility] with [facility] PT in place.                                               Social Determinants of Health (SDOH) Interventions    Readmission Risk Interventions Readmission Risk Prevention Plan 11/09/2019  Transportation Screening Complete  PCP or Specialist Appt within 5-7 Days Complete  Home Care Screening Complete  Medication Review (RN CM) Complete  Some recent data might be hidden

## 2021-01-30 NOTE — Progress Notes (Signed)
CSW attempted to call [facility]([phone]) phone number does not ring.   Arlie Solomons.Kyrus Hyde, MSW, [place]   Transitions of Care Clinical Social Worker I Direct Dial: [phone]   Fax: [phone] [email or web address]

## 2021-01-30 NOTE — Progress Notes (Signed)
CSW spoke with [place] from [facility], to inform her pt will be d/c with [facility] PT, OT in place from [place]. She agreed to accept pt back to facility. MD and RN is aware. Call report [phone].   PTAR called pt on list.  Arlie Solomons.Charlton Boule, MSW, [place]   Transitions of Care Clinical Social Worker I Direct Dial: [phone]   Fax: [phone] [email or web address]

## 2021-01-31 NOTE — Progress Notes (Signed)
SNF bed search started.  

## 2021-01-31 NOTE — TOC Initial Note (Signed)
Transition of Care [facility]) - Initial/Assessment Note    Patient Details  Name: [name] MRN: [id number] Date of Birth: 04/21/1950  Transition of Care [facility]) CM/SW Contact:    Raina Mina, [place] Phone Number: 01/31/2021, 11:21 AM  Clinical Narrative:   CSW spoke with patient who agreed to participate in skilled nursing rehab before returning back to his assisted living facility. Patient is aware there could be a delay in placement due to the holidays. CSW will start bed search for SNF.                 Expected Discharge Plan: Skilled Nursing Facility Barriers to Discharge: Continued Medical Work up   Patient Goals and CMS Choice Patient states their goals for this hospitalization and ongoing recovery are:: Return to [facility], assisted living after rehab   Choice offered to / list presented to : NA  Expected Discharge Plan and Services Expected Discharge Plan: [place]       Living arrangements for the past 2 months: [place]                                      Prior Living Arrangements/Services Living arrangements for the past 2 months: [place] Lives with:: Facility Resident Patient language and need for interpreter reviewed:: Yes Do you feel safe going back to the place where you live?: Yes      Need for Family Participation in Patient Care: No (Comment) Care giver support system in place?: Yes (comment) Current home services: DME Criminal Activity/Legal Involvement Pertinent to Current Situation/Hospitalization: No - Comment as needed  Activities of Daily Living      Permission Sought/Granted Permission sought to share information with : Case Manager Permission granted to share information with : Yes, Release of Information Signed              Emotional Assessment Appearance:: Appears stated age Attitude/Demeanor/Rapport: Engaged Affect (typically observed): Accepting Orientation: :  Oriented to Self, Oriented to Place, Oriented to  Time, Oriented to Situation Alcohol / Substance Use: Not Applicable Psych Involvement: No (comment)  Admission diagnosis:  inflamation Patient Active Problem List   Diagnosis Date Noted   Acute respiratory failure with hypoxia ([place]) 01/19/2021   Hypothyroidism    Macrocytic anemia    Morbid obesity with BMI of 60.0-69.9, adult ([place])    Pneumonia due to COVID-19 virus 01/15/2021   Cellulitis of left leg    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[place])    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   Septic joint of left knee joint ([place]) 11/06/2019   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[place]) 08/15/2019   Open knee wound 08/14/2019   S/P left TK revision 04/03/2019   Administration of long-term prophylactic antibiotics    History of streptococcal infection    History of DVT (deep vein thrombosis)    S/P rev left TK 12/15/2015   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   Preoperative cardiovascular examination 04/17/2013   OSA on CPAP 11/27/2012   Chronic diastolic heart failure ([place]) 09/10/2012   Chronic anticoagulation, with Xarelto 09/10/2012   DVT (deep venous thrombosis), possible 09/10/2012   SOB (shortness of breath) 08/26/2012   Chest discomfort 08/26/2012   Persistent atrial fibrillation ([place]) 08/26/2012   Super obesity 06/27/2012   Expected blood loss anemia 06/27/2012   S/P left TK revision 06/23/2012  Septic arthritis of knee ([place])    Cellulitis 09/19/2010   METHICILLIN RESISTANT STAPHYLOCOCCUS AUREUS INFECTION 09/10/2009   STREPTOCOCCUS INFECTION CCE & UNS SITE GROUP C 07/04/2008   DM 07/04/2008   CHRONIC KIDNEY DISEASE UNSPECIFIED 07/04/2008   PULMONARY EMBOLISM, HX OF 07/04/2008   INFECTION DUE TO INTERNAL ORTH DEVICE NEC 03/02/2006   PCP:  Bernerd Limbo, MD Pharmacy:   [facility], [address] [place]  [address] Phone: [phone] Fax: [phone]     Social Determinants of Health (SDOH) Interventions    Readmission Risk Interventions Readmission Risk Prevention Plan 11/09/2019  Transportation Screening Complete  PCP or Specialist Appt within 5-7 Days Complete  Home Care Screening Complete  Medication Review (RN CM) Complete  Some recent data might be hidden

## 2021-01-31 NOTE — ED Provider Notes (Signed)
Patient awaiting placement to nursing facility no new complaint   Garrett Leigh, MD 01/31/21 [phone]

## 2021-01-31 NOTE — NC FL2 (Signed)
[place] LEVEL OF CARE SCREENING TOOL     IDENTIFICATION  Patient Name: [name] Birthdate: 1950/06/22 Sex: male Admission Date (Current Location): 01/29/2021  [facility] and [place] Number:  Herbalist and Address:  [facility],  [place] [address] [place]      Provider Number: [id number]  Attending Physician Name and Address:  Default, Provider, MD  Relative Name and Phone Number:       Current Level of Care: Hospital Recommended Level of Care: [place] Prior Approval Number:    Date Approved/Denied:   PASRR Number: [id number]  Discharge Plan: SNF    Current Diagnoses: Patient Active Problem List   Diagnosis Date Noted   Acute respiratory failure with hypoxia ([place]) 01/19/2021   Hypothyroidism    Macrocytic anemia    Morbid obesity with BMI of 60.0-69.9, adult ([place])    Pneumonia due to COVID-19 virus 01/15/2021   Cellulitis of left leg    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[place])    Infection of prosthetic left knee joint ([place]) 11/08/2019   Septic joint of left knee joint ([place]) 11/06/2019   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[place]) 08/15/2019   Open knee wound 08/14/2019   S/P left TK revision 04/03/2019   Administration of long-term prophylactic antibiotics    History of streptococcal infection    History of DVT (deep vein thrombosis)    S/P rev left TK 12/15/2015   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   Preoperative cardiovascular examination 04/17/2013   OSA on CPAP 11/27/2012   Chronic diastolic heart failure ([place]) 09/10/2012   Chronic anticoagulation, with Xarelto 09/10/2012   DVT (deep venous thrombosis), possible 09/10/2012   SOB (shortness of breath) 08/26/2012   Chest discomfort 08/26/2012   Persistent atrial fibrillation ([place]) 08/26/2012   Super obesity 06/27/2012   Expected blood loss anemia 06/27/2012   S/P left TK revision 06/23/2012   Septic arthritis of knee ([place])     Cellulitis 09/19/2010   METHICILLIN RESISTANT STAPHYLOCOCCUS AUREUS INFECTION 09/10/2009   STREPTOCOCCUS INFECTION CCE & UNS SITE GROUP C 07/04/2008   DM 07/04/2008   CHRONIC KIDNEY DISEASE UNSPECIFIED 07/04/2008   PULMONARY EMBOLISM, HX OF 07/04/2008   INFECTION DUE TO INTERNAL ORTH DEVICE NEC 03/02/2006    Orientation RESPIRATION BLADDER Height & Weight     Self, Time, Place  O2 (2 Liters) Incontinent Weight: (!) 440 lb (199.6 kg) Height:  5\' 8"  (172.7 cm)  BEHAVIORAL SYMPTOMS/MOOD NEUROLOGICAL BOWEL NUTRITION STATUS      Incontinent Diet (Heart Healthy)  AMBULATORY STATUS COMMUNICATION OF NEEDS Skin   Extensive Assist Verbally Normal                       Personal Care Assistance Level of Assistance  Bathing, Feeding, Dressing Bathing Assistance: Maximum assistance Feeding assistance: Limited assistance Dressing Assistance: Maximum assistance     Functional Limitations Info  Sight, Hearing, Speech Sight Info: Adequate Hearing Info: Adequate Speech Info: Adequate    SPECIAL CARE FACTORS FREQUENCY  PT (By licensed PT)     PT Frequency: 2X week              Contractures Contractures Info: Not present    Additional Factors Info  Code Status, Allergies Code Status Info: Full Allergies Info: Morphine           Current Medications (01/31/2021):  This is the current hospital active medication list Current Facility-Administered Medications  Medication  Dose Route Frequency Provider Last Rate Last Admin   acetaminophen (TYLENOL) tablet 500 mg  500 mg Oral TID PRN Charlesetta Shanks, MD       albuterol (VENTOLIN HFA) 108 (90 Base) MCG/ACT inhaler 2 puff  2 puff Inhalation Q6H PRN Charlesetta Shanks, MD       ascorbic acid (VITAMIN C) tablet 500 mg  500 mg Oral Daily Pfeiffer, Jeannie Done, MD   500 mg at 01/31/21 1050   capsaicin (ZOSTRIX) 0.025 % cream   Topical TID PRN Charlesetta Shanks, MD       clotrimazole (LOTRIMIN) 1 % cream 1 application  1 application Topical BID  Charlesetta Shanks, MD   1 application at 32/44/01 1058   diltiazem (CARDIZEM CD) 24 hr capsule 240 mg  240 mg Oral Daily Charlesetta Shanks, MD   240 mg at 01/31/21 1056   ferrous sulfate tablet 325 mg  325 mg Oral TID WC Pfeiffer, Jeannie Done, MD       furosemide (LASIX) tablet 20 mg  20 mg Oral Daily Pfeiffer, Jeannie Done, MD   20 mg at 01/31/21 1051   gabapentin (NEURONTIN) capsule 800 mg  800 mg Oral TID Charlesetta Shanks, MD   800 mg at 01/31/21 1055   levothyroxine (SYNTHROID) tablet 125 mcg  125 mcg Oral QAC breakfast Charlesetta Shanks, MD   125 mcg at 01/31/21 0272   melatonin tablet 10 mg  10 mg Oral QHS Charlesetta Shanks, MD       metoprolol tartrate (LOPRESSOR) tablet 50 mg  50 mg Oral BID Charlesetta Shanks, MD   50 mg at 01/31/21 1055   multivitamin with minerals tablet 1 tablet  1 tablet Oral Daily Charlesetta Shanks, MD   1 tablet at 01/31/21 1055   naphazoline-glycerin (CLEAR EYES REDNESS) ophth solution 2 drop  2 drop Both Eyes QID PRN Charlesetta Shanks, MD       oxyCODONE (Oxy IR/ROXICODONE) immediate release tablet 5 mg  5 mg Oral Q6H PRN Charlesetta Shanks, MD   5 mg at 01/31/21 1055   potassium chloride (KLOR-CON M) CR tablet 10 mEq  10 mEq Oral Daily Charlesetta Shanks, MD   10 mEq at 01/31/21 1055   rivaroxaban (XARELTO) tablet 20 mg  20 mg Oral QHS Charlesetta Shanks, MD   20 mg at 01/30/21 2230   vitamin B-12 (CYANOCOBALAMIN) tablet 1,000 mcg  1,000 mcg Oral Daily Charlesetta Shanks, MD   1,000 mcg at 01/31/21 1051   zinc sulfate capsule 220 mg  220 mg Oral Daily Charlesetta Shanks, MD   220 mg at 01/31/21 1050   Current Outpatient Medications  Medication Sig Dispense Refill   acetaminophen (TYLENOL) 500 MG tablet Take 500 mg by mouth 3 (three) times daily as needed for mild pain or fever.     albuterol (VENTOLIN HFA) 108 (90 Base) MCG/ACT inhaler Inhale 2 puffs into the lungs every 6 (six) hours as needed for wheezing or shortness of breath. 8 g 1   CAPSAICIN EX Apply 1 application topically 3 (three) times daily as  needed (Phantom Limb Pain). 0.025%     clotrimazole (LOTRIMIN) 1 % cream Apply 1 application topically 2 (two) times daily. Apply to back of the knee     diclofenac Sodium (VOLTAREN) 1 % GEL Apply 4 g topically 2 (two) times daily. Apply to both shoulders     diltiazem (CARDIZEM CD) 240 MG 24 hr capsule TAKE 1 CAPSULE BY MOUTH DAILY (Patient taking differently: Take 240 mg by mouth daily.) 90 capsule 3  furosemide (LASIX) 20 MG tablet Take 20 mg by mouth daily.     gabapentin (NEURONTIN) 800 MG tablet Take 800 mg by mouth 3 (three) times daily.     guaiFENesin-dextromethorphan (ROBITUSSIN DM) 100-10 MG/5ML syrup Take 10 mLs by mouth every 4 (four) hours as needed for cough. 118 mL 1   levothyroxine (SYNTHROID) 125 MCG tablet Take 125 mcg by mouth daily before breakfast.      Melatonin 10 MG TABS Take 10 mg by mouth daily.     methocarbamol (ROBAXIN) 500 MG tablet Take 500 mg by mouth 4 (four) times daily as needed for muscle spasms.     metoprolol tartrate (LOPRESSOR) 50 MG tablet TAKE 1 TABLET BY MOUTH TWICE DAILY (Patient taking differently: Take 50 mg by mouth 2 (two) times daily.) 180 tablet 2   Multiple Vitamin (MULTIVITAMIN WITH MINERALS) TABS Take 1 tablet by mouth daily.     naphazoline-glycerin (CLEAR EYES REDNESS) 0.012-0.2 % SOLN Place 1-2 drops into both eyes 4 (four) times daily as needed for eye irritation. (Patient taking differently: Place 2 drops into both eyes 4 (four) times daily as needed for eye irritation.)  0   oxycodone (OXY-IR) 5 MG capsule Take 5 mg by mouth every 6 (six) hours as needed for pain.     potassium chloride (KLOR-CON M) 10 MEQ tablet Take 10 mEq by mouth daily.     vitamin B-12 (CYANOCOBALAMIN) 1000 MCG tablet Take 1,000 mcg by mouth daily.     XARELTO 20 MG TABS tablet TAKE 1 TABLET BY MOUTH DAILY WITH BREAKFAST (Patient taking differently: Take 20 mg by mouth daily.) 90 tablet 3   ferrous sulfate (FERROUSUL) 325 (65 FE) MG tablet Take 1 tablet (325 mg total)  by mouth 3 (three) times daily with meals for 14 days. (Patient not taking: Reported on 01/15/2021) 42 tablet 0   predniSONE (DELTASONE) 20 MG tablet Take 20 mg by mouth See admin instructions. 40 mg qd x 5 days     vitamin C (ASCORBIC ACID) 500 MG tablet Take 500 mg by mouth daily.     Zinc 220 (50 Zn) MG CAPS Take 220 mg by mouth daily.       Discharge Medications: Please see discharge summary for a list of discharge medications.  Relevant Imaging Results:  Relevant Lab Results:   Additional Information SS# [id number]  Raina Mina, LCSWA

## 2021-01-31 NOTE — ED Notes (Signed)
Changed pt from hospital bed to bariatric bed.

## 2021-01-31 NOTE — ED Notes (Signed)
Provided peri care and changed linens. Pt has stage 1 pressure ulcer on posterior of lower right leg. Pt buttocks are red and brown.

## 2021-02-01 NOTE — ED Notes (Signed)
Patient alert and oriented. Cooperative with care. Appropriate with staff.  Patient medication compliant. Patient needs assist with ADLs.  Patient in bed.  Patient given every meal.

## 2021-02-01 NOTE — ED Provider Notes (Signed)
Emergency Medicine Observation Re-evaluation Note  [name] is a 70 y.o. male, seen on rounds today.  Pt initially presented to the ED for complaints of Groin Swelling Currently, the patient is resting.  Physical Exam  BP (!) 141/98    Pulse 90    Temp 98 F (36.7 C) (Oral)    Resp 16    Ht 1.727 m (5\' 8" )    Wt (!) 199.6 kg    SpO2 91%    BMI 66.90 kg/m  Physical Exam   ED Course / MDM  EKG:   I have reviewed the labs performed to date as well as medications administered while in observation.  Recent changes in the last 24 hours include none.  Plan  Current plan is for awaiting placement.  [name] is not under involuntary commitment.     Lacretia Leigh, MD 02/01/21 1055

## 2021-02-02 MED ORDER — RIVAROXABAN 20 MG PO TABS
20.0000 mg | ORAL_TABLET | Freq: Every day | ORAL | Status: DC
  Filled 2021-02-02: qty 1

## 2021-02-02 NOTE — Progress Notes (Signed)
Pt room 121B Call report  [phone].  PTAR Called.  Arlie Solomons.Bartow Zylstra, MSW, [place]   Transitions of Care Clinical Social Worker I Direct Dial: [phone]   Fax: [phone] [email or web address]

## 2021-02-02 NOTE — ED Notes (Signed)
Returned pt cell phone, gray shirt, pants, and wool blanket. Pt confirms all belongings are present. Pt holding pt belonging bag on his chest as PTAR preparing pt to leave on stretcher.

## 2021-02-02 NOTE — Progress Notes (Signed)
CSW to submit insurance Auth.  Arlie Solomons.Musab Wingard, MSW, [place]   Transitions of Care Clinical Social Worker I Direct Dial: [phone]   Fax: [phone] [email or web address].

## 2021-02-02 NOTE — Progress Notes (Signed)
Pt Auth for [facility]  has been approved [id number]. Pt will need neg COVID test and AVS sent to facility. MD and RN aware.  Arlie Solomons.Chayanne Filippi, MSW, [place]   Transitions of Care Clinical Social Worker I Direct Dial: [phone]   Fax: [phone] [email or web address]

## 2021-02-02 NOTE — Progress Notes (Signed)
Physical Therapy Treatment Patient Details Name: [name] MRN: [id number] DOB: Nov 10, 1950 Today's Date: 02/02/2021   History of Present Illness 70 yo male admitted from ALF with scrotal pain and swelling. 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    PT Comments    The patient now is  on a bari air bed which offers significant challenges for safe sitting on edge of bed., attempts to stand.  Patient  performed active/resistive RLE and UE exercises.  May have to consider  maximove  for transfer to recliner, then attempt to stand. Patient reports that he was continuing to transfer from his [facility] to toilet at  the facility  using rails, Stated" I just could not get from my WC to the bed." A  hospital bed was being ordered prior to patient  return to ED with scrotal edema.  Recommendations for follow up therapy are one component of a multi-disciplinary discharge planning process, led by the attending physician.  Recommendations may be updated based on patient status, additional functional criteria and insurance authorization.  Follow Up Recommendations  Skilled nursing-short term rehab (<3 hours/day)     Assistance Recommended at Discharge Frequent or constant Supervision/Assistance  Equipment Recommendations       Recommendations for Other Services       Precautions / Restrictions Precautions Precautions: Fall Precaution Comments: L AKA 11/17/19 he does not wear his leg (too heavy)   Scrotal edema/injury/    Mobility  Bed Mobility               General bed mobility comments: Only able to partially roll to L 2* body habitus and limited space on gurney. Trendelenburg + maxi slide sheet to position pt up towards HOB. patient now on  air barimax bed making mobility very challenging. Placed bed in "chair " position  per control but was not anywhere near seated position.    Transfers                        Ambulation/Gait                    Stairs             Wheelchair Mobility    Modified Rankin (Stroke Patients Only)       Balance                                            Cognition                                       General Comments: AxO x 3 very pleasant        Exercises General Exercises - Lower Extremity Ankle Circles/Pumps: AROM;Right;10 reps Quad Sets: AROM;Right;10 reps Short Arc Quad: AROM;Right;20 reps Heel Slides: AAROM;Strengthening;20 reps;Right Hip ABduction/ADduction: Right;10 reps;Strengthening Straight Leg Raises: AAROM;Right;20 reps    General Comments        Pertinent Vitals/Pain Pain Location: scrotal area Pain Descriptors / Indicators: Grimacing;Discomfort;Tender Pain Intervention(s): Limited activity within patient's tolerance;Patient requesting pain meds-RN notified    Home Living                          Prior Function  PT Goals (current goals can now be found in the care plan section) Progress towards PT goals: Progressing toward goals    Frequency    Min 2X/week      PT Plan Current plan remains appropriate    Co-evaluation              AM-PAC PT "6 Clicks" Mobility   Outcome Measure  Help needed turning from your back to your side while in a flat bed without using bedrails?: Total Help needed moving from lying on your back to sitting on the side of a flat bed without using bedrails?: Total Help needed moving to and from a bed to a chair (including a wheelchair)?: Total Help needed standing up from a chair using your arms (e.g., wheelchair or bedside chair)?: Total Help needed to walk in hospital room?: Total Help needed climbing 3-5 steps with a railing? : Total 6 Click Score: 6    End of Session   Activity Tolerance: Other (comment) (limited by body habitus and air bari bed) Patient left: in bed;with call bell/phone within reach Nurse Communication: Mobility status;Need for  lift equipment PT Visit Diagnosis: Muscle weakness (generalized) (M62.81);History of falling (Z91.81);Other abnormalities of gait and mobility (R26.89);Pain     Time: 1155-1230 PT Time Calculation (min) (ACUTE ONLY): 35 min  Charges:  $Therapeutic Exercise: 8-22 mins $Therapeutic Activity: 8-22 mins                     Tresa Endo PT Acute Rehabilitation Services Pager [phone] Office [phone]    Claretha Cooper 02/02/2021, 2:12 PM

## 2021-02-03 LAB — SARS CORONAVIRUS 2 (TAT 6-24 HRS): SARS Coronavirus 2: POSITIVE — AB

## 2021-02-16 ENCOUNTER — Non-Acute Institutional Stay: Payer: Medicare Other | Admitting: Hospice

## 2021-02-16 ENCOUNTER — Other Ambulatory Visit: Payer: Self-pay

## 2021-02-16 DIAGNOSIS — I5032 Chronic diastolic (congestive) heart failure: Secondary | ICD-10-CM

## 2021-02-16 DIAGNOSIS — R531 Weakness: Secondary | ICD-10-CM

## 2021-02-16 DIAGNOSIS — Z515 Encounter for palliative care: Secondary | ICD-10-CM

## 2021-02-16 DIAGNOSIS — Z6841 Body Mass Index (BMI) 40.0 and over, adult: Secondary | ICD-10-CM

## 2021-02-16 NOTE — Progress Notes (Signed)
[facility] Consult Note Telephone: [phone]  Fax: [phone]  PATIENT NAME: [name] [phone] [address] [phone] (home)  DOB: [birth year] MRN: [id number]  PRIMARY CARE PROVIDER:    Bernerd Limbo, MD,  [place] [id number] [phone]  REFERRING PROVIDER:   Martinique Miller NP  RESPONSIBLE PARTY:   Self Contact Information     Name Relation Home Work Mobile   [name],[name] Sister [phone]  [phone]   Benay Spice [phone]  [phone]        I met face to face with patient at facility. Palliative Care was asked to follow this patient by consultation request of  Miller Martinique NP to address advance care planning, complex medical decision making and goals of care clarification. Patient endorsed palliative service. This is the initial visit.    ASSESSMENT AND / RECOMMENDATIONS:   Advance Care Planning: Our advance care planning conversation included a discussion about:    The value and importance of advance care planning  Difference between Hospice and Palliative care Exploration of goals of care in the event of a sudden injury or illness  Identification and preparation of a healthcare agent  Review and updating or creation of an  advance directive document .  CODE STATUS: Patient affirmed he is a Full code.  Goals of Care: Goals include to maximize quality of life and symptom management  I spent  16 minutes providing this initial consultation. More than 50% of the time in this consultation was spent on counseling patient and coordinating communication. --------------------------------------------------------------------------------------------------------------------------------------  Symptom Management/Plan: Weakness: PT/OT is ongoing for strengthening and to promote autonomy and environmental adaptation Morbid Obesity: Education  provided on the cons of morbid obesity as it relates to metabolic syndrome.  Reduce sweetened drinks and snacks.  Patient interested in working with a nutritionist. Recommendation: Patient will benefit from [place] consult/dietary. CHF: Managed with Lasix. Continue reduced salt and adhere to fluid limits. Routine CBC BMP Follow up: Palliative care will continue to follow for complex medical decision making, advance care planning, and clarification of goals. Return 6 weeks or prn. Encouraged to call provider sooner with any concerns.   Family /Caregiver/Community Supports: Patient in SNF for ongoing care.   HOSPICE ELIGIBILITY/DIAGNOSIS: TBD  Chief Complaint: Initial Palliative care visit  HISTORY OF PRESENT ILLNESS:  [name] is a 71 y.o. year old male  with multiple medical conditions including  weakness  which worsened in the past month following  two hospitalization last month - Dec for COVID-19 viral infection and later same month  for scrotal swelling.  Weakness impairs his ADLs, further worsened with his LAKA status. He is currently on PT/OT which is helpful. History of Chronic diastolic CHF, Morbid obesity and Hypothyroidism. Patient denies pain/discomfort; was open to working with a nutritionist to help with his obesity. History obtained from review of EMR, discussion with primary team, caregiver, family and/or Mr. Shonka.  Review and summarization of Epic records shows history from other than patient. Rest of 10 point ROS asked and negative.  I reviewed as needed, available labs, patient records, imaging, studies and related documents from the EMR.  ROS General: NAD EYES: denies vision changes ENMT: denies dysphagia Cardiovascular: denies chest pain/discomfort Pulmonary: denies cough, denies SOB Abdomen: endorses good appetite, denies constipation/diarrhea GU: denies dysuria, urinary frequency MSK:  endorses weakness,  no falls reported Skin: denies rashes or  wounds Neurological: denies pain, denies  insomnia Psych: Endorses positive mood Heme/lymph/immuno: denies bruises, abnormal bleeding  Physical Exam: Height/Weight: 5 feet 8 inches/443 Ib3 BMI  67.35 Constitutional: NAD General: Well groomed, cooperative EYES: anicteric sclera, lids intact, no discharge  ENMT: Moist mucous membrane CV: S1 S2, RRR, no RE edema Pulmonary: LCTA, no increased work of breathing, no cough, oxygen supplementation  Abdomen: active BS + 4 quadrants, soft and non tender GU: no suprapubic tenderness, foley cath in place, light yellow urine in foley bag MSK: weakness, LAKA limited ROM Skin: warm and dry, no rashes or wounds on visible skin Neuro:  weakness, otherwise non focal, alert and oriented x 3 Psych: non-anxious affect Hem/lymph/immuno: no widespread bruising   PAST MEDICAL HISTORY:  Active Ambulatory Problems    Diagnosis Date Noted   STREPTOCOCCUS INFECTION CCE & UNS SITE GROUP C 07/04/2008   METHICILLIN RESISTANT STAPHYLOCOCCUS AUREUS INFECTION 09/10/2009   DM 07/04/2008   CHRONIC KIDNEY DISEASE UNSPECIFIED 07/04/2008   INFECTION DUE TO INTERNAL ORTH DEVICE NEC 03/02/2006   PULMONARY EMBOLISM, HX OF 07/04/2008   Cellulitis 09/19/2010   Septic arthritis of knee ([place])    S/P left TK revision 06/23/2012   Super obesity 06/27/2012   Expected blood loss anemia 06/27/2012   SOB (shortness of breath) 08/26/2012   Chest discomfort 08/26/2012   Persistent atrial fibrillation (HCC) 08/26/2012   Chronic diastolic heart failure ([place]) 09/10/2012   Chronic anticoagulation, with Xarelto 09/10/2012   DVT (deep venous thrombosis), possible 09/10/2012   OSA on CPAP 11/27/2012   Preoperative cardiovascular examination 04/17/2013   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   S/P rev left TK 12/15/2015   Administration of long-term prophylactic antibiotics    History of streptococcal infection    History of DVT (deep vein thrombosis)    S/P left TK revision  04/03/2019   Open knee wound 08/14/2019   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[place]) 08/15/2019   Septic joint of left knee joint ([place]) 11/06/2019   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)    Cellulitis of left leg    Pneumonia due to COVID-19 virus 01/15/2021   Hypothyroidism    Macrocytic anemia    Morbid obesity with BMI of 60.0-69.9, adult ([place])    Acute respiratory failure with hypoxia ([place]) 01/19/2021   Resolved Ambulatory Problems    Diagnosis Date Noted   Acquired absence of knee joint following removal of joint prosthesis with presence of antibiotic-impregnated cement spacer 09/21/2018   Acquired absence of knee joint following explantation of joint prosthesis with presence of antibiotic-impregnated cement spacer 04/03/2019   Past Medical History:  Diagnosis Date   CHF (congestive heart failure) ([place]) 03/4095   Complication of anesthesia    Complication of anesthesia    Dysrhythmia    GERD (gastroesophageal reflux disease)    History of blood clots    History of blood transfusion    Pain    Shortness of breath    Sleep apnea     SOCIAL HX:  Social History   Tobacco Use   Smoking status: Never   Smokeless tobacco: Never  Substance Use Topics   Alcohol use: Not Currently    Comment: not since 1999. However, had around 6 drinks a day between 1993-1999, roughly.     FAMILY HX:  Family History  Problem Relation Age of Onset   Heart disease Father 85   Pancreatic cancer Sister    Liver cancer Sister  Cervical cancer Sister    Breast cancer Sister    Diabetes Sister    Colon cancer Neg Hx    Throat cancer Neg Hx    Stomach cancer Neg Hx    Kidney disease Neg Hx    Liver disease Neg Hx       ALLERGIES:  Allergies  Allergen Reactions   Morphine Hives      PERTINENT MEDICATIONS:  Outpatient Encounter Medications as of 02/16/2021  Medication Sig   acetaminophen (TYLENOL) 500 MG tablet  Take 500 mg by mouth 3 (three) times daily as needed for mild pain or fever.   albuterol (VENTOLIN HFA) 108 (90 Base) MCG/ACT inhaler Inhale 2 puffs into the lungs every 6 (six) hours as needed for wheezing or shortness of breath.   CAPSAICIN EX Apply 1 application topically 3 (three) times daily as needed (Phantom Limb Pain). 0.025%   clotrimazole (LOTRIMIN) 1 % cream Apply 1 application topically 2 (two) times daily. Apply to back of the knee   diclofenac Sodium (VOLTAREN) 1 % GEL Apply 4 g topically 2 (two) times daily. Apply to both shoulders   diltiazem (CARDIZEM CD) 240 MG 24 hr capsule TAKE 1 CAPSULE BY MOUTH DAILY (Patient taking differently: Take 240 mg by mouth daily.)   ferrous sulfate (FERROUSUL) 325 (65 FE) MG tablet Take 1 tablet (325 mg total) by mouth 3 (three) times daily with meals for 14 days. (Patient not taking: Reported on 01/15/2021)   furosemide (LASIX) 20 MG tablet Take 20 mg by mouth daily.   gabapentin (NEURONTIN) 800 MG tablet Take 800 mg by mouth 3 (three) times daily.   guaiFENesin-dextromethorphan (ROBITUSSIN DM) 100-10 MG/5ML syrup Take 10 mLs by mouth every 4 (four) hours as needed for cough.   levothyroxine (SYNTHROID) 125 MCG tablet Take 125 mcg by mouth daily before breakfast.    Melatonin 10 MG TABS Take 10 mg by mouth daily.   methocarbamol (ROBAXIN) 500 MG tablet Take 500 mg by mouth 4 (four) times daily as needed for muscle spasms.   metoprolol tartrate (LOPRESSOR) 50 MG tablet TAKE 1 TABLET BY MOUTH TWICE DAILY (Patient taking differently: Take 50 mg by mouth 2 (two) times daily.)   Multiple Vitamin (MULTIVITAMIN WITH MINERALS) TABS Take 1 tablet by mouth daily.   naphazoline-glycerin (CLEAR EYES REDNESS) 0.012-0.2 % SOLN Place 1-2 drops into both eyes 4 (four) times daily as needed for eye irritation. (Patient taking differently: Place 2 drops into both eyes 4 (four) times daily as needed for eye irritation.)   oxycodone (OXY-IR) 5 MG capsule Take 5 mg by  mouth every 6 (six) hours as needed for pain.   potassium chloride (KLOR-CON M) 10 MEQ tablet Take 10 mEq by mouth daily.   predniSONE (DELTASONE) 20 MG tablet Take 20 mg by mouth See admin instructions. 40 mg qd x 5 days   vitamin B-12 (CYANOCOBALAMIN) 1000 MCG tablet Take 1,000 mcg by mouth daily.   vitamin C (ASCORBIC ACID) 500 MG tablet Take 500 mg by mouth daily.   XARELTO 20 MG TABS tablet TAKE 1 TABLET BY MOUTH DAILY WITH BREAKFAST (Patient taking differently: Take 20 mg by mouth daily.)   Zinc 220 (50 Zn) MG CAPS Take 220 mg by mouth daily.   No facility-administered encounter medications on file as of 02/16/2021.     Thank you for the opportunity to participate in the care of Mr. Mcisaac.  The palliative care team will continue to follow. Please call our office at [phone]  if we can be of additional assistance.   Note: Portions of this note were generated with [employer]. Dictation errors may occur despite best attempts at proofreading.  Teodoro Spray, NP

## 2021-02-20 IMAGING — XA PICC
1 series · 2 of 2 positions shown · IV contrast (agent unspecified)
Comparison: none

INDICATION: Poor venous access. In need of durable intravenous access for
antibiotic administration.

EXAM:
ULTRASOUND AND FLUOROSCOPIC GUIDED PICC LINE INSERTION
MEDICATIONS:
None.
CONTRAST:  None
FLUOROSCOPY TIME:  36 seconds (19 mGy)
COMPLICATIONS:
None immediate.
TECHNIQUE: The procedure, risks, benefits, and alternatives were explained to
the patient and informed written consent was obtained. A timeout was
performed prior to the initiation of the procedure.

[Series 300: ir picc placement left >5 yrs inc img gu · 2 of 2 slices shown]
[im 1/2]
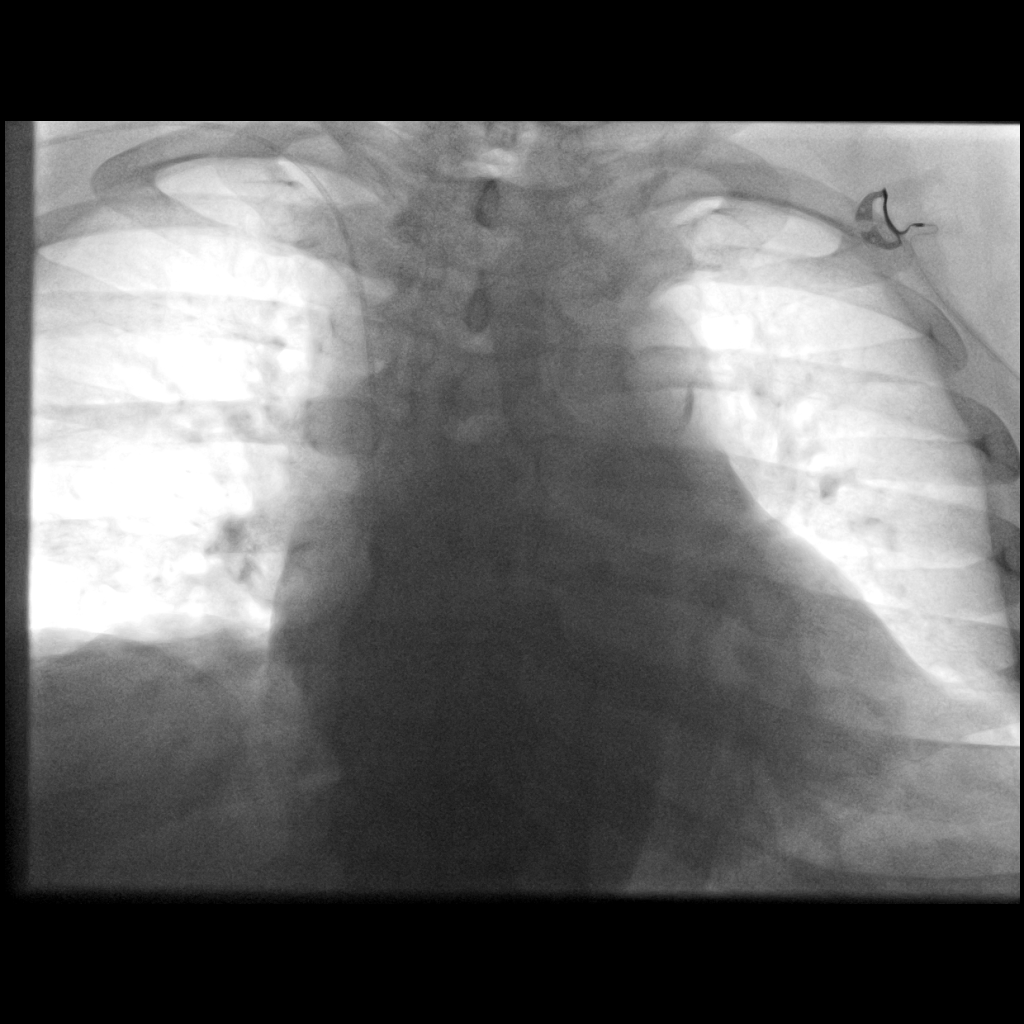
[im 2/2]
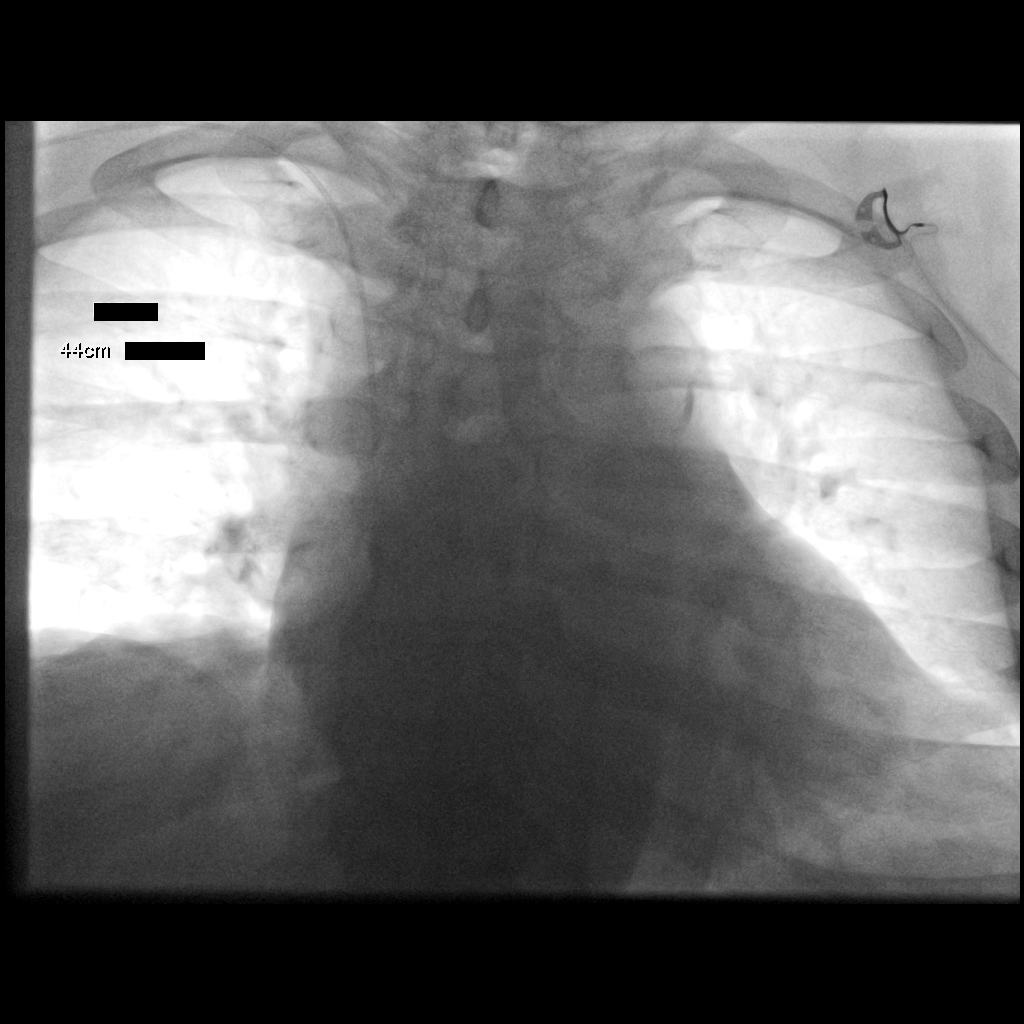

[2 of 2 positions shown; findings below may reference images not displayed]

The right upper extremity was prepped with chlorhexidine in a
sterile fashion, and a sterile drape was applied covering the
operative field. Maximum barrier sterile technique with sterile
gowns and gloves were used for the procedure. A timeout was
performed prior to the initiation of the procedure. Local anesthesia
was provided with 1% lidocaine.

Under direct ultrasound guidance, the brachial vein was accessed
with a micropuncture kit after the overlying soft tissues were
anesthetized with 1% lidocaine.

After the overlying soft tissues were anesthetized, a small venotomy
incision was created and a micropuncture kit was utilized to access
the right brachial vein. Real-time ultrasound guidance was utilized
for vascular access including the acquisition of a permanent
ultrasound image documenting patency of the accessed vessel.

A guidewire was advanced to the level of the superior caval-atrial
junction for measurement purposes and the PICC line was cut to
length. A peel-away sheath was placed and a 44 cm, 5 French, single
lumen was inserted to level of the superior caval-atrial junction. A
post procedure spot fluoroscopic was obtained. The catheter easily
aspirated and flushed and was sutured in place. A dressing was
placed. The patient tolerated the procedure well without immediate
post procedural complication.
FINDINGS: After catheter placement, the tip lies within the superior
cavoatrial junction. The catheter aspirates and flushes normally and
is ready for immediate use.
IMPRESSION: Successful ultrasound and fluoroscopic guided placement of a right
brachial vein approach, 44 cm, 5 French, single lumen PICC with tip
at the superior caval-atrial junction. The PICC line is ready for
immediate use.

## 2021-02-26 ENCOUNTER — Emergency Department (HOSPITAL_COMMUNITY)
Admission: EM | Admit: 2021-02-26 | Discharge: 2021-02-26 | Disposition: A | Discharge status: Skilled Nursing Facility | Payer: Medicare Other | Attending: Emergency Medicine | Admitting: Emergency Medicine

## 2021-02-26 ENCOUNTER — Encounter (HOSPITAL_COMMUNITY): Payer: Self-pay

## 2021-02-26 ENCOUNTER — Emergency Department (HOSPITAL_BASED_OUTPATIENT_CLINIC_OR_DEPARTMENT_OTHER): Payer: Medicare Other

## 2021-02-26 DIAGNOSIS — M79661 Pain in right lower leg: Secondary | ICD-10-CM | POA: Insufficient documentation

## 2021-02-26 DIAGNOSIS — Z96652 Presence of left artificial knee joint: Secondary | ICD-10-CM | POA: Diagnosis not present

## 2021-02-26 DIAGNOSIS — M79604 Pain in right leg: Secondary | ICD-10-CM

## 2021-02-26 DIAGNOSIS — I5032 Chronic diastolic (congestive) heart failure: Secondary | ICD-10-CM | POA: Diagnosis not present

## 2021-02-26 DIAGNOSIS — Z79899 Other long term (current) drug therapy: Secondary | ICD-10-CM | POA: Insufficient documentation

## 2021-02-26 DIAGNOSIS — I4819 Other persistent atrial fibrillation: Secondary | ICD-10-CM | POA: Insufficient documentation

## 2021-02-26 DIAGNOSIS — M7989 Other specified soft tissue disorders: Secondary | ICD-10-CM | POA: Diagnosis not present

## 2021-02-26 DIAGNOSIS — Z85038 Personal history of other malignant neoplasm of large intestine: Secondary | ICD-10-CM | POA: Diagnosis not present

## 2021-02-26 DIAGNOSIS — Z8616 Personal history of COVID-19: Secondary | ICD-10-CM | POA: Diagnosis not present

## 2021-02-26 DIAGNOSIS — Z7901 Long term (current) use of anticoagulants: Secondary | ICD-10-CM | POA: Diagnosis not present

## 2021-02-26 DIAGNOSIS — E039 Hypothyroidism, unspecified: Secondary | ICD-10-CM | POA: Diagnosis not present

## 2021-02-26 LAB — CBC WITH DIFFERENTIAL/PLATELET
Abs Immature Granulocytes: 0.03 10*3/uL (ref 0.00–0.07)
Basophils Absolute: 0 10*3/uL (ref 0.0–0.1)
Basophils Relative: 0 %
Eosinophils Absolute: 0 10*3/uL (ref 0.0–0.5)
Eosinophils Relative: 1 %
HCT: 31.9 % — ABNORMAL LOW (ref 39.0–52.0)
Hemoglobin: 9.6 g/dL — ABNORMAL LOW (ref 13.0–17.0)
Immature Granulocytes: 1 %
Lymphocytes Relative: 17 %
Lymphs Abs: 0.8 10*3/uL (ref 0.7–4.0)
MCH: 30.3 pg (ref 26.0–34.0)
MCHC: 30.1 g/dL (ref 30.0–36.0)
MCV: 100.6 fL — ABNORMAL HIGH (ref 80.0–100.0)
Monocytes Absolute: 0.5 10*3/uL (ref 0.1–1.0)
Monocytes Relative: 11 %
Neutro Abs: 3.3 10*3/uL (ref 1.7–7.7)
Neutrophils Relative %: 70 %
Platelets: 199 10*3/uL (ref 150–400)
RBC: 3.17 MIL/uL — ABNORMAL LOW (ref 4.22–5.81)
RDW: 17 % — ABNORMAL HIGH (ref 11.5–15.5)
WBC: 4.8 10*3/uL (ref 4.0–10.5)
nRBC: 0 % (ref 0.0–0.2)

## 2021-02-26 LAB — BASIC METABOLIC PANEL
Anion gap: 8 (ref 5–15)
BUN: 19 mg/dL (ref 8–23)
CO2: 32 mmol/L (ref 22–32)
Calcium: 9.7 mg/dL (ref 8.9–10.3)
Chloride: 98 mmol/L (ref 98–111)
Creatinine, Ser: 1.02 mg/dL (ref 0.61–1.24)
GFR, Estimated: >60 mL/min (ref >60)
Glucose, Bld: 113 mg/dL — ABNORMAL HIGH (ref 70–99)
Potassium: 3.9 mmol/L (ref 3.5–5.1)
Sodium: 138 mmol/L (ref 135–145)

## 2021-02-26 LAB — PROTIME-INR
INR: 1.3 — ABNORMAL HIGH (ref 0.8–1.2)
Prothrombin Time: 16.3 seconds — ABNORMAL HIGH (ref 11.4–15.2)

## 2021-02-26 NOTE — ED Provider Notes (Signed)
Blood pressure 92/69, pulse (!) 105, temperature 98.7 F (37.1 C), temperature source Oral, resp. rate (!) 22, SpO2 98 %.  Assuming care from Dr. Leonette Monarch.  In short, [name] is a 71 y.o. male with a chief complaint of No chief complaint on file. Marland Kitchen  Refer to the original H&P for additional details.  The current plan of care is to follow up with [place].  08:50 AM  DVT ultrasound negative. Plan for discharge.       Margette Fast, MD 02/26/21 1515

## 2021-02-26 NOTE — Progress Notes (Signed)
RLE venous duplex has been completed.  Preliminary results given to Dr. Laverta Baltimore.   Results can be found under chart review under CV PROC. 02/26/2021 9:05 AM Atonya Templer RVT, RDMS

## 2021-02-26 NOTE — ED Notes (Signed)
Attempted to call report to [facility], unsuccessful at this time.

## 2021-02-26 NOTE — ED Provider Notes (Signed)
[facility] DEPT Provider Note  CSN: [id number] Arrival date & time: 02/26/21 0209  Chief Complaint(s) No chief complaint on file.  HPI [name] is a 71 y.o. male    Leg Pain Location:  Leg Leg location:  R lower leg Pain details:    Quality:  Aching   Severity:  Moderate   Onset quality:  Gradual   Duration:  1 week   Timing:  Constant   Progression:  Worsening Chronicity:  New Associated symptoms: swelling   Associated symptoms: no fever    Past Medical History Past Medical History:  Diagnosis Date   CHF (congestive heart failure) ([place]) 05/2019   Chronic anticoagulation    with Xarelto   Complication of anesthesia    PT STATES HARD TO WAKE UP AFTER ONE SUGERY -[place].  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   Shortness of breath    WITH EXERTION AND PAIN   Sleep apnea    uses CPAP,   Patient Active Problem List   Diagnosis Date Noted   Acute respiratory failure with hypoxia ([place]) 01/19/2021   Hypothyroidism    Macrocytic anemia    Morbid obesity with BMI of 60.0-69.9, adult ([place])    Pneumonia due to COVID-19 virus 01/15/2021   Cellulitis of left leg    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[place])    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   Septic joint of left knee joint ([place]) 11/06/2019   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   Open knee wound 08/14/2019   S/P left TK revision 04/03/2019   Administration of long-term prophylactic  antibiotics    History of streptococcal infection    History of DVT (deep vein thrombosis)    S/P rev left TK 12/15/2015   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   Preoperative cardiovascular examination 04/17/2013   OSA on CPAP 11/27/2012   Chronic diastolic heart failure ([place]) 09/10/2012   Chronic anticoagulation, with Xarelto 09/10/2012   DVT (deep venous thrombosis), possible 09/10/2012   SOB (shortness of breath) 08/26/2012   Chest discomfort 08/26/2012   Persistent atrial fibrillation ([place]) 08/26/2012   Super obesity 06/27/2012   Expected blood loss anemia 06/27/2012   S/P left TK revision 06/23/2012   Septic arthritis of knee ([place])    Cellulitis 09/19/2010   METHICILLIN RESISTANT STAPHYLOCOCCUS AUREUS INFECTION 09/10/2009   STREPTOCOCCUS INFECTION CCE & UNS SITE GROUP C 07/04/2008   DM 07/04/2008   CHRONIC KIDNEY DISEASE UNSPECIFIED 07/04/2008   PULMONARY EMBOLISM, HX OF 07/04/2008   INFECTION DUE TO INTERNAL ORTH DEVICE NEC 03/02/2006   Home Medication(s) Prior to Admission medications   Medication Sig Start Date End Date Taking? Authorizing Provider  acetaminophen (TYLENOL) 500 MG tablet Take 500 mg by mouth 3 (three) times daily as needed for mild pain or fever.    [provider]  albuterol (VENTOLIN HFA) 108 (90 Base)  MCG/ACT inhaler Inhale 2 puffs into the lungs every 6 (six) hours as needed for wheezing or shortness of breath. 01/20/21   Shelly Coss, MD  CAPSAICIN EX Apply 1 application topically 3 (three) times daily as needed (Phantom Limb Pain). 0.025%    [provider]  clotrimazole (LOTRIMIN) 1 % cream Apply 1 application topically 2 (two) times daily. Apply to back of the knee    [provider]  diclofenac Sodium (VOLTAREN) 1 % GEL Apply 4 g topically 2 (two) times daily. Apply to both shoulders    [provider]  diltiazem (CARDIZEM CD) 240 MG 24 hr capsule TAKE 1 CAPSULE BY MOUTH DAILY Patient taking differently: Take 240  mg by mouth daily. 10/10/18   Croitoru, Mihai, MD  ferrous sulfate (FERROUSUL) 325 (65 FE) MG tablet Take 1 tablet (325 mg total) by mouth 3 (three) times daily with meals for 14 days. Patient not taking: Reported on 01/15/2021 08/16/19 10/30/20  Danae Orleans, PA-C  furosemide (LASIX) 20 MG tablet Take 20 mg by mouth daily. 07/28/16   [provider]  gabapentin (NEURONTIN) 800 MG tablet Take 800 mg by mouth 3 (three) times daily. 07/21/20   [provider]  guaiFENesin-dextromethorphan (ROBITUSSIN DM) 100-10 MG/5ML syrup Take 10 mLs by mouth every 4 (four) hours as needed for cough. 01/20/21   Shelly Coss, MD  levothyroxine (SYNTHROID) 125 MCG tablet Take 125 mcg by mouth daily before breakfast.  10/31/15   [provider]  Melatonin 10 MG TABS Take 10 mg by mouth daily.    [provider]  methocarbamol (ROBAXIN) 500 MG tablet Take 500 mg by mouth 4 (four) times daily as needed for muscle spasms. 12/10/20   [provider]  metoprolol tartrate (LOPRESSOR) 50 MG tablet TAKE 1 TABLET BY MOUTH TWICE DAILY Patient taking differently: Take 50 mg by mouth 2 (two) times daily. 12/12/18   Croitoru, Mihai, MD  Multiple Vitamin (MULTIVITAMIN WITH MINERALS) TABS Take 1 tablet by mouth daily.    [provider]  naphazoline-glycerin (CLEAR EYES REDNESS) 0.012-0.2 % SOLN Place 1-2 drops into both eyes 4 (four) times daily as needed for eye irritation. Patient taking differently: Place 2 drops into both eyes 4 (four) times daily as needed for eye irritation. 11/21/19   Persons, Bevely Palmer, PA  oxycodone (OXY-IR) 5 MG capsule Take 5 mg by mouth every 6 (six) hours as needed for pain.    [provider]  potassium chloride (KLOR-CON M) 10 MEQ tablet Take 10 mEq by mouth daily. 01/06/21   [provider]  predniSONE (DELTASONE) 20 MG tablet Take 20 mg by mouth See admin instructions. 40 mg qd x 5 days    [provider]  vitamin B-12  (CYANOCOBALAMIN) 1000 MCG tablet Take 1,000 mcg by mouth daily.    [provider]  vitamin C (ASCORBIC ACID) 500 MG tablet Take 500 mg by mouth daily.    [provider]  XARELTO 20 MG TABS tablet TAKE 1 TABLET BY MOUTH DAILY WITH BREAKFAST Patient taking differently: Take 20 mg by mouth daily. 10/08/19   Croitoru, Mihai, MD  Zinc 220 (50 Zn) MG CAPS Take 220 mg by mouth daily.    [provider]  Allergies Morphine  Review of Systems Review of Systems  Constitutional:  Negative for fever.  As noted in HPI  Physical Exam Vital Signs  I have reviewed the triage vital signs BP 107/65    Pulse (!) 105    Temp 98.7 F (37.1 C) (Oral)    Resp 17    SpO2 95%   Physical Exam Vitals reviewed.  Constitutional:      General: He is not in acute distress.    Appearance: He is well-developed. He is obese. He is not diaphoretic.  HENT:     Head: Normocephalic and atraumatic.     Right Ear: External ear normal.     Left Ear: External ear normal.     Nose: Nose normal.     Mouth/Throat:     Mouth: Mucous membranes are moist.  Eyes:     General: No scleral icterus.    Conjunctiva/sclera: Conjunctivae normal.  Neck:     Trachea: Phonation normal.  Cardiovascular:     Rate and Rhythm: Normal rate and regular rhythm.  Pulmonary:     Effort: Pulmonary effort is normal. No respiratory distress.     Breath sounds: No stridor.  Abdominal:     General: There is no distension.  Musculoskeletal:        General: Normal range of motion.     Cervical back: Normal range of motion.       Legs:  Neurological:     Mental Status: He is alert and oriented to person, place, and time.  Psychiatric:        Behavior: Behavior normal.    ED Results and Treatments Labs (all labs ordered are listed, but only abnormal results are displayed) Labs  Reviewed  CBC WITH DIFFERENTIAL/PLATELET - Abnormal; Notable for the following components:      Result Value   RBC 3.17 (*)    Hemoglobin 9.6 (*)    HCT 31.9 (*)    MCV 100.6 (*)    RDW 17.0 (*)    All other components within normal limits  BASIC METABOLIC PANEL - Abnormal; Notable for the following components:   Glucose, Bld 113 (*)    All other components within normal limits  PROTIME-INR - Abnormal; Notable for the following components:   Prothrombin Time 16.3 (*)    INR 1.3 (*)    All other components within normal limits                                                                                                                         EKG  EKG Interpretation  Date/Time:    Ventricular Rate:    PR Interval:    QRS Duration:   QT Interval:    QTC Calculation:   R Axis:     Text Interpretation:         Radiology No results found.  Pertinent labs & imaging results that were available during my care of the patient were reviewed by  me and considered in my medical decision making (see MDM for details).  Medications Ordered in ED Medications - No data to display                                                                                                                                   Procedures Procedures  (including critical care time)  Medical Decision Making / ED Course        Right leg pain Concerning for superficial thrombophlebitis, or cellulitits. Will also rule out DVT  Work-up ordered to assess concerns above. Labs Independently interpreted by me and noted below: CBC without leukocytosis No significant electrolyte derangement or renal sufficiency   Awaiting [place] of right LE.  Patient care turned over to oncoming provider. Patient case and results discussed in detail; please see their note for further ED managment.     Final Clinical Impression(s) / ED Diagnoses Final diagnoses:  None           This chart was dictated using voice  recognition software.  Despite best efforts to proofread,  errors can occur which can change the documentation meaning.    Fatima Blank, MD 02/26/21 [id number]

## 2021-02-26 NOTE — Discharge Instructions (Addendum)
You were seen in the [facility] department today with right leg pain and swelling.  There is no evidence of blood clot in the leg.  I do not see signs of infection.  You may use compression stockings or hose to keep swelling down.  Please follow with your primary care doctor.  Return with any new or suddenly worsening symptoms.

## 2021-02-26 NOTE — ED Triage Notes (Signed)
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

## 2021-02-26 NOTE — ED Notes (Signed)
[facility] called for transport.  

## 2021-03-19 ENCOUNTER — Emergency Department (HOSPITAL_COMMUNITY)
Admission: EM | Admit: 2021-03-19 | Discharge: 2021-03-19 | Disposition: A | Discharge status: Home / Self Care | Payer: Medicare Other | Attending: Emergency Medicine | Admitting: Emergency Medicine

## 2021-03-19 ENCOUNTER — Encounter (HOSPITAL_COMMUNITY): Payer: Self-pay

## 2021-03-19 ENCOUNTER — Other Ambulatory Visit: Payer: Self-pay

## 2021-03-19 DIAGNOSIS — R339 Retention of urine, unspecified: Secondary | ICD-10-CM | POA: Diagnosis present

## 2021-03-19 DIAGNOSIS — N390 Urinary tract infection, site not specified: Secondary | ICD-10-CM | POA: Insufficient documentation

## 2021-03-19 DIAGNOSIS — Z7901 Long term (current) use of anticoagulants: Secondary | ICD-10-CM | POA: Insufficient documentation

## 2021-03-19 DIAGNOSIS — R2230 Localized swelling, mass and lump, unspecified upper limb: Secondary | ICD-10-CM | POA: Insufficient documentation

## 2021-03-19 LAB — URINALYSIS, ROUTINE W REFLEX MICROSCOPIC
Bilirubin Urine: NEGATIVE
Glucose, UA: NEGATIVE mg/dL
Ketones, ur: NEGATIVE mg/dL
Nitrite: NEGATIVE
Protein, ur: 100 mg/dL — AB
RBC / HPF: >50 RBC/hpf — ABNORMAL HIGH (ref 0–5)
Specific Gravity, Urine: 1.016 (ref 1.005–1.030)
WBC, UA: >50 WBC/hpf — ABNORMAL HIGH (ref 0–5)
pH: 6 (ref 5.0–8.0)

## 2021-03-19 LAB — BASIC METABOLIC PANEL
Anion gap: 3 — ABNORMAL LOW (ref 5–15)
BUN: 21 mg/dL (ref 8–23)
CO2: 31 mmol/L (ref 22–32)
Calcium: 9.3 mg/dL (ref 8.9–10.3)
Chloride: 107 mmol/L (ref 98–111)
Creatinine, Ser: 1.15 mg/dL (ref 0.61–1.24)
GFR, Estimated: >60 mL/min (ref >60)
Glucose, Bld: 98 mg/dL (ref 70–99)
Potassium: 4.4 mmol/L (ref 3.5–5.1)
Sodium: 141 mmol/L (ref 135–145)

## 2021-03-19 LAB — CBC WITH DIFFERENTIAL/PLATELET
Abs Immature Granulocytes: 0.01 10*3/uL (ref 0.00–0.07)
Basophils Absolute: 0 10*3/uL (ref 0.0–0.1)
Basophils Relative: 0 %
Eosinophils Absolute: 0.1 10*3/uL (ref 0.0–0.5)
Eosinophils Relative: 3 %
HCT: 33.1 % — ABNORMAL LOW (ref 39.0–52.0)
Hemoglobin: 9.6 g/dL — ABNORMAL LOW (ref 13.0–17.0)
Immature Granulocytes: 0 %
Lymphocytes Relative: 19 %
Lymphs Abs: 0.9 10*3/uL (ref 0.7–4.0)
MCH: 30.7 pg (ref 26.0–34.0)
MCHC: 29 g/dL — ABNORMAL LOW (ref 30.0–36.0)
MCV: 105.8 fL — ABNORMAL HIGH (ref 80.0–100.0)
Monocytes Absolute: 0.7 10*3/uL (ref 0.1–1.0)
Monocytes Relative: 14 %
Neutro Abs: 3.1 10*3/uL (ref 1.7–7.7)
Neutrophils Relative %: 64 %
Platelets: 213 10*3/uL (ref 150–400)
RBC: 3.13 MIL/uL — ABNORMAL LOW (ref 4.22–5.81)
RDW: 20.1 % — ABNORMAL HIGH (ref 11.5–15.5)
WBC: 4.9 10*3/uL (ref 4.0–10.5)
nRBC: 0.6 % — ABNORMAL HIGH (ref 0.0–0.2)

## 2021-03-19 MED ORDER — CEPHALEXIN 500 MG PO CAPS
1000.0000 mg | ORAL_CAPSULE | Freq: Once | ORAL | Status: AC
  Administered 2021-03-19: 1000 mg via ORAL
  Filled 2021-03-19: qty 2

## 2021-03-19 MED ORDER — SODIUM CHLORIDE 0.9 % IV BOLUS
1000.0000 mL | Freq: Once | INTRAVENOUS | Status: AC
  Administered 2021-03-19: 1000 mL via INTRAVENOUS

## 2021-03-19 MED ORDER — CEPHALEXIN 500 MG PO CAPS: 1000.0000 mg | ORAL_CAPSULE | Freq: Two times a day (BID) | ORAL | 0 refills | Status: AC

## 2021-03-19 NOTE — ED Notes (Signed)
Voided 343ml amber urine. Specimen collected and sent to lab

## 2021-03-19 NOTE — ED Notes (Signed)
Resting quietly. No signs of distress. No complaints. VSS

## 2021-03-19 NOTE — ED Triage Notes (Signed)
"  From [facility]. States he had a catheter removed on Jan 29th before released from the hospital and since then he is only able to void small amounts at a time" per EMS Denies pain, dysuria, or any other symptoms.

## 2021-03-19 NOTE — ED Notes (Signed)
Bladder scan averaged 123ml urine in bladder. External catheter placed to low wall suction.

## 2021-03-19 NOTE — Discharge Instructions (Signed)
Looks like you are able to urinate okay.  No signs that you are having an obstruction.  Your renal function looks okay.  Your urine does look possibly infected.  I am going to send it off for culture.  We will treat you with an antibiotic.  Please follow-up with your family doctor in the office.

## 2021-03-19 NOTE — ED Provider Notes (Signed)
[facility] DEPT Provider Note   CSN: [id number] Arrival date & time: 03/19/21  1012     History  Chief Complaint  Patient presents with   Urinary Retention    [name] is a 71 y.o. male.  71 yo M with a cc of decreased urinary output.  He says this over the past 2 or 3 days.  He denies any other significant change from his baseline.  Feels that he has been able to eat and drink without issue.  Denies vomiting or diarrhea.  Does not feel like he is having trouble completing urination.       Home Medications Prior to Admission medications   Medication Sig Start Date End Date Taking? Authorizing Provider  acetaminophen (TYLENOL) 500 MG tablet Take 500 mg by mouth every 8 (eight) hours as needed for mild pain or fever.   Yes [provider]  albuterol (VENTOLIN HFA) 108 (90 Base) MCG/ACT inhaler Inhale 2 puffs into the lungs every 6 (six) hours as needed for wheezing or shortness of breath. 01/20/21  Yes Shelly Coss, MD  capsaicin (ZOSTRIX) 0.025 % cream Apply 1 application topically every 8 (eight) hours as needed (phantom limb pain on the left thigh/leg).   Yes [provider]  cephALEXin (KEFLEX) 500 MG capsule Take 2 capsules (1,000 mg total) by mouth 2 (two) times daily for 7 days. 2 caps po bid x 7 days 03/19/21 03/26/21 Yes Deno Etienne, DO  clotrimazole (LOTRIMIN) 1 % cream Apply 1 application topically 2 (two) times daily. 03/12/21  Yes [provider]  diclofenac Sodium (VOLTAREN) 1 % GEL Apply 4 g topically every 12 (twelve) hours. Apply to both shoulders   Yes [provider]  diltiazem (CARDIZEM CD) 240 MG 24 hr capsule TAKE 1 CAPSULE BY MOUTH DAILY Patient taking differently: Take 240 mg by mouth daily. 10/10/18  Yes Croitoru, Mihai, MD  furosemide (LASIX) 20 MG tablet Take 20 mg by mouth daily. 07/28/16  Yes [provider]  gabapentin (NEURONTIN) 800 MG tablet Take 800 mg by mouth 3 (three)  times daily. 07/21/20  Yes [provider]  guaiFENesin-dextromethorphan (ROBITUSSIN DM) 100-10 MG/5ML syrup Take 10 mLs by mouth every 4 (four) hours as needed for cough. 01/20/21  Yes Shelly Coss, MD  levothyroxine (SYNTHROID) 125 MCG tablet Take 125 mcg by mouth daily before breakfast.  10/31/15  Yes [provider]  Melatonin 10 MG CAPS Take 10 mg by mouth at bedtime.   Yes [provider]  methocarbamol (ROBAXIN) 500 MG tablet Take 500 mg by mouth every 6 (six) hours as needed for muscle spasms. 12/10/20  Yes [provider]  metoprolol tartrate (LOPRESSOR) 50 MG tablet TAKE 1 TABLET BY MOUTH TWICE DAILY Patient taking differently: Take 50 mg by mouth every 12 (twelve) hours. 12/12/18  Yes Croitoru, Mihai, MD  Multiple Vitamin (MULTIVITAMIN WITH MINERALS) TABS Take 1 tablet by mouth daily.   Yes [provider]  naphazoline-glycerin (CLEAR EYES REDNESS) 0.012-0.2 % SOLN Place 1-2 drops into both eyes 4 (four) times daily as needed for eye irritation. Patient taking differently: Place 2 drops into both eyes every 6 (six) hours as needed for eye irritation. 11/21/19  Yes Persons, Bevely Palmer, [place]  oxycodone (OXY-IR) 5 MG capsule Take 5 mg by mouth every 6 (six) hours as needed for pain.   Yes [provider]  potassium chloride (KLOR-CON M) 10 MEQ tablet Take 10 mEq by mouth daily. 01/06/21  Yes  [provider]  vitamin B-12 (CYANOCOBALAMIN) 1000 MCG tablet Take 1,000 mcg by mouth daily.   Yes [provider]  vitamin C (ASCORBIC ACID) 500 MG tablet Take 500 mg by mouth daily.   Yes [provider]  XARELTO 20 MG TABS tablet TAKE 1 TABLET BY MOUTH DAILY WITH BREAKFAST 10/08/19  Yes Croitoru, Mihai, MD  zinc gluconate 50 MG tablet Take 50 mg by mouth daily.   Yes [provider]      Allergies    Morphine    Review of Systems   Review of Systems  Physical Exam Updated Vital Signs BP 103/79    Pulse 69     Temp 98 F (36.7 C) (Oral)    Resp 18    Ht 5\' 8"  (1.727 m)    Wt (!) 199.6 kg    SpO2 93%    BMI 66.90 kg/m  Physical Exam Vitals and nursing note reviewed.  Constitutional:      Appearance: He is well-developed.  HENT:     Head: Normocephalic and atraumatic.  Eyes:     Pupils: Pupils are equal, round, and reactive to light.  Neck:     Vascular: No JVD.  Cardiovascular:     Rate and Rhythm: Normal rate and regular rhythm.     Heart sounds: No murmur heard.   No friction rub. No gallop.  Pulmonary:     Effort: No respiratory distress.     Breath sounds: No wheezing.  Abdominal:     General: There is no distension.     Tenderness: There is no abdominal tenderness. There is no guarding or rebound.  Musculoskeletal:        General: Normal range of motion.     Cervical back: Normal range of motion and neck supple.     Comments: Left AKA.  Some edema to the groin with some mild erythema.  No obvious ulceration fluctuance.  Skin:    Coloration: Skin is not pale.     Findings: No rash.  Neurological:     Mental Status: He is alert and oriented to person, place, and time.  Psychiatric:        Behavior: Behavior normal.    ED Results / Procedures / Treatments   Labs (all labs ordered are listed, but only abnormal results are displayed) Labs Reviewed  CBC WITH DIFFERENTIAL/PLATELET - Abnormal; Notable for the following components:      Result Value   RBC 3.13 (*)    Hemoglobin 9.6 (*)    HCT 33.1 (*)    MCV 105.8 (*)    MCHC 29.0 (*)    RDW 20.1 (*)    nRBC 0.6 (*)    All other components within normal limits  BASIC METABOLIC PANEL - Abnormal; Notable for the following components:   Anion gap 3 (*)    All other components within normal limits  URINALYSIS, ROUTINE W REFLEX MICROSCOPIC - Abnormal; Notable for the following components:   APPearance CLOUDY (*)    Hgb urine dipstick LARGE (*)    Protein, ur 100 (*)    Leukocytes,Ua LARGE (*)    RBC / HPF >50 (*)    WBC, UA  >50 (*)    Bacteria, UA MANY (*)    All other components within normal limits  URINE CULTURE    EKG None  Radiology No results found.  Procedures Procedures    Medications Ordered in ED Medications  cephALEXin (KEFLEX) capsule 1,000 mg (has  no administration in time range)  sodium chloride 0.9 % bolus 1,000 mL (0 mLs Intravenous Stopped 03/19/21 1323)    ED Course/ Medical Decision Making/ A&P                           Medical Decision Making Amount and/or Complexity of Data Reviewed Labs: ordered.  Risk Prescription drug management.   Patient is a 71 y.o. male with a cc of decreased urinary output.  Has been going on for 2 or 3 days.  Seems less likely to be acute urinary retention.  Will obtain a bladder scan.  Lab work to assess for renal dysfunction.  His records were reviewed he is seeing palliative care.  Progressive decline with multiple recent hospitalizations.  He is a DNR.  Bladder scan without acute urinary retention.  No renal dysfunction.  Hemoglobin appears to be at baseline.  Patient's UA is concerning for urinary tract infection.  Will start on antibiotics.  PCP follow-up.  4:41 PM:  I have discussed the diagnosis/risks/treatment options with the patient.  Evaluation and diagnostic testing in the emergency department does not suggest an emergent condition requiring admission or immediate intervention beyond what has been performed at this time.  They will follow up with  PCP. We also discussed returning to the ED immediately if new or worsening sx occur. We discussed the sx which are most concerning (e.g., sudden worsening pain, fever, inability to tolerate by mouth) that necessitate immediate return. Medications administered to the patient during their visit and any new prescriptions provided to the patient are listed below.  Medications given during this visit Medications  cephALEXin (KEFLEX) capsule 1,000 mg (has no administration in time range)  sodium  chloride 0.9 % bolus 1,000 mL (0 mLs Intravenous Stopped 03/19/21 1323)     The patient appears reasonably screen and/or stabilized for discharge and I doubt any other medical condition or other [facility] requiring further screening, evaluation, or treatment in the ED at this time prior to discharge.         Final Clinical Impression(s) / ED Diagnoses Final diagnoses:  Lower urinary tract infectious disease    Rx / DC Orders ED Discharge Orders          Ordered    cephALEXin (KEFLEX) 500 MG capsule  2 times daily        03/19/21 Teller, Felicia Both, DO 03/19/21 1641

## 2021-03-19 NOTE — ED Notes (Signed)
Transport called for transportation of patient back to [facility].

## 2021-03-22 LAB — URINE CULTURE: Culture: >=100000 — AB

## 2021-03-23 ENCOUNTER — Telehealth (HOSPITAL_BASED_OUTPATIENT_CLINIC_OR_DEPARTMENT_OTHER): Payer: Self-pay | Admitting: Emergency Medicine

## 2021-03-23 NOTE — Telephone Encounter (Signed)
Post ED Visit - Positive Culture Follow-up  Culture report reviewed by antimicrobial stewardship pharmacist: [place] Team []  Elenor Quinones, Pharm.D. []  Heide Guile, Pharm.D., BCPS AQ-ID []  Parks Neptune, Pharm.D., BCPS []  Alycia Rossetti, Pharm.D., BCPS []  [place], Pharm.D., BCPS, AAHIVP []  Legrand Como, Pharm.D., BCPS, AAHIVP []  Salome Arnt, PharmD, BCPS []  Johnnette Gourd, PharmD, BCPS []  Hughes Better, PharmD, BCPS []  Leeroy Cha, PharmD []  Laqueta Linden, PharmD, BCPS []  Albertina Parr, PharmD  [place] Team []  Leodis Sias, PharmD []  Lindell Spar, PharmD []  Royetta Asal, PharmD []  Graylin Shiver, Rph []  Rema Fendt) Glennon Mac, PharmD []  Arlyn Dunning, PharmD []  Netta Cedars, PharmD []  Dia Sitter, PharmD []  Leone Haven, PharmD []  Gretta Arab, PharmD []  Theodis Shove, PharmD []  Peggyann Juba, PharmD []  Reuel Boom, PharmD Jimmy Footman PharmD   Positive urine culture Treated with cephalexin, organism sensitive to the same and no further patient follow-up is required at this time.  Hazle Nordmann 03/23/2021, 9:43 AM

## 2021-04-07 ENCOUNTER — Non-Acute Institutional Stay: Payer: Medicare Other | Admitting: Hospice

## 2021-04-07 ENCOUNTER — Other Ambulatory Visit: Payer: Self-pay

## 2021-04-07 DIAGNOSIS — Z6841 Body Mass Index (BMI) 40.0 and over, adult: Secondary | ICD-10-CM

## 2021-04-07 DIAGNOSIS — I5032 Chronic diastolic (congestive) heart failure: Secondary | ICD-10-CM

## 2021-04-07 DIAGNOSIS — Z515 Encounter for palliative care: Secondary | ICD-10-CM

## 2021-04-07 DIAGNOSIS — R531 Weakness: Secondary | ICD-10-CM

## 2021-04-07 NOTE — Progress Notes (Addendum)
[facility] Palliative Care Consult Note Telephone: [phone]  Fax: [phone]  PATIENT NAME: [name] [facility] [address] [place] [address] [phone] (home)  DOB: 09/07/1950 MRN: [id number]  PRIMARY CARE PROVIDER:    Bernerd Limbo, MD,  [place] [address] [phone]  REFERRING PROVIDER:   Martinique Miller NP  RESPONSIBLE PARTY:   Self Contact Information     Name Relation Home Work Mobile   [name],[name] Sister [phone]  [phone]   Benay Spice [phone]  [phone]       Palliative Care was asked to follow this patient by consultation request of  Miller Martinique NP to address advance care planning, complex medical decision making and goals of care clarification. Patient endorsed palliative service.     ASSESSMENT AND / RECOMMENDATIONS:   CODE STATUS: Patient affirmed he is a Full code.  Goals of Care: Goals include to maximize quality of life and symptom management  Symptom Management/Plan: Right hip pain: Managed Tylenol, Oxycodone, methocarbamol and gabapentin.  Weakness: PT/OT is ongoing for strengthening and to promote autonomy and environmental adaptation Morbid Obesity: Education provided on t metabolic syndrome associated with morbid obesity as it relates to metabolic syndrome.  Encouraged reduce sweetened drinks and snacks.  Patient interested in working with a nutritionist. Recommendation: Patient will benefit from [place] consult/dietary. CHF: Managed with Lasix. Continue reduced salt and adhere to fluid limits. Routine CBC BMP Follow up: Palliative care will continue to follow for complex medical decision making, advance care planning, and clarification of goals. Return 6 weeks or prn. Encouraged to call provider sooner with any concerns.   HOSPICE ELIGIBILITY/DIAGNOSIS: TBD  Chief Complaint: Follow up  visit  HISTORY OF PRESENT ILLNESS:  [name] is a 71 y.o. year old male  with multiple medical conditions including chronic diastolic heart failure, morbid obesity, hypothyroidism, weakness, A-fib.  History of COVID-19 viral infection, scrotal swelling. He is currently on PT/OT which is helpful. Patient denies pain/discomfort; was open to working with a nutritionist to help with his obesity. History obtained from review of EMR, discussion with primary team, caregiver, family and/or [name].  Review and summarization of Epic records shows history from other than patient. Rest of 10 point ROS asked and negative.  I reviewed as needed, available labs, patient records, imaging, studies and related documents from the EMR.  Physical Exam: Height/Weight: 5 feet 8 inches/440 Ib   Constitutional: NAD General: Well groomed, cooperative EYES: anicteric sclera, lids intact, no discharge  ENMT: Moist mucous membrane CV: S1 S2, RRR, no RE edema Pulmonary: LCTA, no increased work of breathing, no cough, oxygen supplementation  Abdomen: active BS + 4 quadrants, soft and non tender MSK: weakness, LAKA limited ROM Skin: warm and dry, no rashes or wounds on visible skin Neuro:  weakness, otherwise non focal, alert and oriented x 3 Psych: non-anxious affect Hem/lymph/immuno: no widespread bruising   PAST MEDICAL HISTORY:  Active Ambulatory Problems    Diagnosis Date Noted   STREPTOCOCCUS INFECTION CCE & UNS SITE GROUP C 07/04/2008   METHICILLIN RESISTANT STAPHYLOCOCCUS AUREUS INFECTION 09/10/2009   DM 07/04/2008   CHRONIC KIDNEY DISEASE UNSPECIFIED 07/04/2008   INFECTION DUE TO INTERNAL ORTH DEVICE NEC 03/02/2006   PULMONARY EMBOLISM, HX OF 07/04/2008   Cellulitis 09/19/2010   Septic arthritis of knee ([place])    S/P left TK revision 06/23/2012   Super obesity 06/27/2012   Expected blood loss anemia 06/27/2012  SOB (shortness of breath) 08/26/2012   Chest discomfort 08/26/2012   Persistent atrial fibrillation ([place]) 08/26/2012   Chronic  diastolic heart failure ([place]) 09/10/2012   Chronic anticoagulation, with Xarelto 09/10/2012   DVT (deep venous thrombosis), possible 09/10/2012   OSA on CPAP 11/27/2012   Preoperative cardiovascular examination 04/17/2013   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   S/P rev left TK 12/15/2015   Administration of long-term prophylactic antibiotics    History of streptococcal infection    History of DVT (deep vein thrombosis)    S/P left TK revision 04/03/2019   Open knee wound 08/14/2019   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[place]) 08/15/2019   Septic joint of left knee joint ([place]) 11/06/2019   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[place])    Cellulitis of left leg    Pneumonia due to COVID-19 virus 01/15/2021   Hypothyroidism    Macrocytic anemia    Morbid obesity with BMI of 60.0-69.9, adult ([place])    Acute respiratory failure with hypoxia ([place]) 01/19/2021   Resolved Ambulatory Problems    Diagnosis Date Noted   Acquired absence of knee joint following removal of joint prosthesis with presence of antibiotic-impregnated cement spacer 09/21/2018   Acquired absence of knee joint following explantation of joint prosthesis with presence of antibiotic-impregnated cement spacer 04/03/2019   Past Medical History:  Diagnosis Date   CHF (congestive heart failure) ([place]) 84/6659   Complication of anesthesia    Complication of anesthesia    Dysrhythmia    GERD (gastroesophageal reflux disease)    History of blood clots    History of blood transfusion    Pain    Shortness of breath    Sleep apnea     SOCIAL HX:  Social History   Tobacco Use   Smoking status: Never   Smokeless tobacco: Never  Substance Use Topics   Alcohol use: Not Currently    Comment: not since 1999. However, had around 6 drinks a day between 1993-1999, roughly.     FAMILY HX:  Family History  Problem Relation Age of Onset   Heart disease Father 61    Pancreatic cancer Sister    Liver cancer Sister    Cervical cancer Sister    Breast cancer Sister    Diabetes Sister    Colon cancer Neg Hx    Throat cancer Neg Hx    Stomach cancer Neg Hx    Kidney disease Neg Hx    Liver disease Neg Hx       ALLERGIES:  Allergies  Allergen Reactions   Morphine Hives      PERTINENT MEDICATIONS:  Outpatient Encounter Medications as of 04/07/2021  Medication Sig   acetaminophen (TYLENOL) 500 MG tablet Take 500 mg by mouth every 8 (eight) hours as needed for mild pain or fever.   albuterol (VENTOLIN HFA) 108 (90 Base) MCG/ACT inhaler Inhale 2 puffs into the lungs every 6 (six) hours as needed for wheezing or shortness of breath.   capsaicin (ZOSTRIX) 0.025 % cream Apply 1 application topically every 8 (eight) hours as needed (phantom limb pain on the left thigh/leg).   clotrimazole (LOTRIMIN) 1 % cream Apply 1 application topically 2 (two) times daily.   diclofenac Sodium (VOLTAREN) 1 % GEL Apply 4 g topically every 12 (twelve) hours. Apply to both shoulders   diltiazem (CARDIZEM CD) 240 MG 24 hr capsule TAKE 1 CAPSULE BY MOUTH DAILY (Patient taking differently:  Take 240 mg by mouth daily.)   furosemide (LASIX) 20 MG tablet Take 20 mg by mouth daily.   gabapentin (NEURONTIN) 800 MG tablet Take 800 mg by mouth 3 (three) times daily.   guaiFENesin-dextromethorphan (ROBITUSSIN DM) 100-10 MG/5ML syrup Take 10 mLs by mouth every 4 (four) hours as needed for cough.   levothyroxine (SYNTHROID) 125 MCG tablet Take 125 mcg by mouth daily before breakfast.    Melatonin 10 MG CAPS Take 10 mg by mouth at bedtime.   methocarbamol (ROBAXIN) 500 MG tablet Take 500 mg by mouth every 6 (six) hours as needed for muscle spasms.   metoprolol tartrate (LOPRESSOR) 50 MG tablet TAKE 1 TABLET BY MOUTH TWICE DAILY (Patient taking differently: Take 50 mg by mouth every 12 (twelve) hours.)   Multiple Vitamin (MULTIVITAMIN WITH MINERALS) TABS Take 1 tablet by mouth daily.    naphazoline-glycerin (CLEAR EYES REDNESS) 0.012-0.2 % SOLN Place 1-2 drops into both eyes 4 (four) times daily as needed for eye irritation. (Patient taking differently: Place 2 drops into both eyes every 6 (six) hours as needed for eye irritation.)   oxycodone (OXY-IR) 5 MG capsule Take 5 mg by mouth every 6 (six) hours as needed for pain.   potassium chloride (KLOR-CON M) 10 MEQ tablet Take 10 mEq by mouth daily.   vitamin B-12 (CYANOCOBALAMIN) 1000 MCG tablet Take 1,000 mcg by mouth daily.   vitamin C (ASCORBIC ACID) 500 MG tablet Take 500 mg by mouth daily.   XARELTO 20 MG TABS tablet TAKE 1 TABLET BY MOUTH DAILY WITH BREAKFAST   zinc gluconate 50 MG tablet Take 50 mg by mouth daily.   No facility-administered encounter medications on file as of 04/07/2021.     Thank you for the opportunity to participate in the care of [name].  The palliative care team will continue to follow. Please call our office at [phone] if we can be of additional assistance.   Note: Portions of this note were generated with [employer]. Dictation errors may occur despite best attempts at proofreading.  Teodoro Spray, NP

## 2021-04-07 NOTE — Addendum Note (Signed)
Addended by: Laverda Sorenson I on: 04/07/2021 04:30 PM   Modules accepted: Level of Service

## 2021-04-12 ENCOUNTER — Emergency Department (HOSPITAL_COMMUNITY)
Admission: EM | Admit: 2021-04-12 | Discharge: 2021-04-12 | Disposition: A | Discharge status: Home / Self Care | Payer: Medicare Other | Source: Home / Self Care | Attending: Emergency Medicine | Admitting: Emergency Medicine

## 2021-04-12 ENCOUNTER — Other Ambulatory Visit: Payer: Self-pay

## 2021-04-12 DIAGNOSIS — N5089 Other specified disorders of the male genital organs: Secondary | ICD-10-CM | POA: Insufficient documentation

## 2021-04-12 DIAGNOSIS — L03314 Cellulitis of groin: Secondary | ICD-10-CM | POA: Diagnosis not present

## 2021-04-12 DIAGNOSIS — N4889 Other specified disorders of penis: Secondary | ICD-10-CM | POA: Insufficient documentation

## 2021-04-12 DIAGNOSIS — I509 Heart failure, unspecified: Secondary | ICD-10-CM | POA: Insufficient documentation

## 2021-04-12 DIAGNOSIS — Z79899 Other long term (current) drug therapy: Secondary | ICD-10-CM | POA: Insufficient documentation

## 2021-04-12 MED ORDER — FUROSEMIDE 20 MG PO TABS: 40.0000 mg | ORAL_TABLET | Freq: Every day | ORAL | 0 refills | Status: DC

## 2021-04-12 NOTE — ED Provider Notes (Signed)
?[facility] DEPT ?Provider Note ? ? ?CSN: [id number] ?Arrival date & time: 04/12/21  1449 ? ?  ? ?History ? ?Chief Complaint  ?Patient presents with  ? Groin Swelling  ? ? ?[name] is a 71 y.o. male w/ hx of CHF on 3-4L [place] at baseline, lower extremity amputation, presenting from nursing facility with concern for penile and scrotal edema.  This has been waxing and waning for several days but was very significant this morning.  He is still able to urinate.  No other acute complaints. ? ?Patient is on lasix 10 mg daily ? ?Last BMP Feb 2023 1 month ago with Cr 1.02, normal GFR and electrolytes ? ?Hx of urinary retention seen in Ed 03/19/21 per history, but had empty bladder scan and UA concerning for UTI, proteus, which was treated with keflex ? ?HPI ? ?  ? ?Home Medications ?Prior to Admission medications   ?Medication Sig Start Date End Date Taking? Authorizing Provider  ?acetaminophen (TYLENOL) 500 MG tablet Take 500 mg by mouth every 8 (eight) hours as needed for mild pain or fever.    [provider]  ?albuterol (VENTOLIN HFA) 108 (90 Base) MCG/ACT inhaler Inhale 2 puffs into the lungs every 6 (six) hours as needed for wheezing or shortness of breath. 01/20/21   Shelly Coss, MD  ?capsaicin (ZOSTRIX) 0.025 % cream Apply 1 application topically every 8 (eight) hours as needed (phantom limb pain on the left thigh/leg).    [provider]  ?clotrimazole (LOTRIMIN) 1 % cream Apply 1 application topically 2 (two) times daily. 03/12/21   [provider]  ?diclofenac Sodium (VOLTAREN) 1 % GEL Apply 4 g topically every 12 (twelve) hours. Apply to both shoulders    [provider]  ?diltiazem (CARDIZEM CD) 240 MG 24 hr capsule TAKE 1 CAPSULE BY MOUTH DAILY ?Patient taking differently: Take 240 mg by mouth daily. 10/10/18   Croitoru, Mihai, MD  ?furosemide (LASIX) 20 MG tablet Take 2 tablets (40 mg total) by mouth daily for 7 days. 04/12/21 04/19/21   Wyvonnia Dusky, MD  ?gabapentin (NEURONTIN) 800 MG tablet Take 800 mg by mouth 3 (three) times daily. 07/21/20   [provider]  ?guaiFENesin-dextromethorphan (ROBITUSSIN DM) 100-10 MG/5ML syrup Take 10 mLs by mouth every 4 (four) hours as needed for cough. 01/20/21   Shelly Coss, MD  ?levothyroxine (SYNTHROID) 125 MCG tablet Take 125 mcg by mouth daily before breakfast.  10/31/15   [provider]  ?Melatonin 10 MG CAPS Take 10 mg by mouth at bedtime.    [provider]  ?methocarbamol (ROBAXIN) 500 MG tablet Take 500 mg by mouth every 6 (six) hours as needed for muscle spasms. 12/10/20   [provider]  ?metoprolol tartrate (LOPRESSOR) 50 MG tablet TAKE 1 TABLET BY MOUTH TWICE DAILY ?Patient taking differently: Take 50 mg by mouth every 12 (twelve) hours. 12/12/18   Croitoru, Mihai, MD  ?Multiple Vitamin (MULTIVITAMIN WITH MINERALS) TABS Take 1 tablet by mouth daily.    [provider]  ?naphazoline-glycerin (CLEAR EYES REDNESS) 0.012-0.2 % SOLN Place 1-2 drops into both eyes 4 (four) times daily as needed for eye irritation. ?Patient taking differently: Place 2 drops into both eyes every 6 (six) hours as needed for eye irritation. 11/21/19   Persons, Bevely Palmer, PA  ?oxycodone (OXY-IR) 5 MG capsule Take 5 mg by mouth every 6 (six) hours as needed for pain.    [provider]  ?potassium chloride (KLOR-CON  M) 10 MEQ tablet Take 10 mEq by mouth daily. 01/06/21   [provider]  ?vitamin B-12 (CYANOCOBALAMIN) 1000 MCG tablet Take 1,000 mcg by mouth daily.    [provider]  ?vitamin C (ASCORBIC ACID) 500 MG tablet Take 500 mg by mouth daily.    [provider]  ?XARELTO 20 MG TABS tablet TAKE 1 TABLET BY MOUTH DAILY WITH BREAKFAST 10/08/19   Croitoru, Mihai, MD  ?zinc gluconate 50 MG tablet Take 50 mg by mouth daily.    [provider]  ?   ? ?Allergies    ?Morphine   ? ?Review of Systems   ?Review of Systems ? ?Physical  Exam ?Updated Vital Signs ?BP 129/76   Pulse 74   Temp 98 ?F (36.7 ?C) (Oral)   Resp 18   SpO2 99%  ?Physical Exam ?Constitutional:   ?   General: He is not in acute distress. ?   Appearance: He is obese.  ?HENT:  ?   Head: Normocephalic and atraumatic.  ?Eyes:  ?   Conjunctiva/sclera: Conjunctivae normal.  ?   Pupils: Pupils are equal, round, and reactive to light.  ?Cardiovascular:  ?   Rate and Rhythm: Normal rate and regular rhythm.  ?Pulmonary:  ?   Effort: Pulmonary effort is normal. No respiratory distress.  ?   Comments: On baseline oxygen ?Abdominal:  ?   General: There is no distension.  ?   Tenderness: There is no abdominal tenderness.  ?Genitourinary: ?   Comments: Scrotal and penile edema ?Skin: ?   General: Skin is warm and dry.  ?Neurological:  ?   General: No focal deficit present.  ?   Mental Status: He is alert. Mental status is at baseline.  ? ? ?ED Results / Procedures / Treatments   ?Labs ?(all labs ordered are listed, but only abnormal results are displayed) ?Labs Reviewed - No data to display ? ?EKG ?None ? ?Radiology ?No results found. ? ?Procedures ?Procedures  ? ? ?Medications Ordered in ED ?Medications - No data to display ? ?ED Course/ Medical Decision Making/ A&P ?  ?                        ?Medical Decision Making ?Risk ?Prescription drug management. ? ? ?Pt here with diffuse scrotal edema and penile edema ?No evidence of gangrene, abscess, doubt testicular torsion or skin infection ?Suspect this is related to CHF ?Advised scrotal elevation, we can ice the penis for 10 minutes intermittently here, increase his lasix dosing to 20 mg at home, and give urology follow up. ? ?With his history of UTI's, I am hesitant to place a foley catheter at this time, as he appears prone to urinary infections.  He is still able to void. ? ?I reviewed external records including most recent urine culture results ? ? ? ? ? ? ? ? ?Final Clinical Impression(s) / ED Diagnoses ?Final diagnoses:  ?Scrotal  swelling  ?Penile edema  ? ? ?Rx / DC Orders ?ED Discharge Orders   ? ?      Ordered  ?  furosemide (LASIX) 20 MG tablet  Daily       ? 04/12/21 1529  ? ?  ?  ? ?  ? ? ?  ?Wyvonnia Dusky, MD ?04/12/21 1715 ? ?

## 2021-04-12 NOTE — Discharge Instructions (Addendum)
The scrotal and penile edema is most likely related to congestive heart failure.  We recommend scrotal elevation at home (elevating 6 inches with a soft towel underneath the scrotum), intermittent cold compresses on the penis (not the scrotum) for 10 minutes at a time, four times daily, until the swelling goes down.  I would also recommend doubling his lasix dose from 20 mg to 40 mg daily for the next 7 days.  Please give him a dose of 40 mg lasix upon his return to [facility]. ? ?[name] should follow up with a urologist -place call to schedule him an appointment in 1-2 weeks at [facility] urology. ? ?Because of [name]'s history of urine infection, we have chosen not to place a foley catheter at this time.  Foley catheters are rarely needed with penile edema, even severe cases.  However if he does have difficulty urinating (getting any urine out) for more than 12 hours, or difficulty with worsening lower abdominal pain, he may need a catheter placed. ?

## 2021-04-12 NOTE — ED Triage Notes (Signed)
Pt BIBA from [facility]. Pt c/o scrotal swelling and pain (6/10), which started today. Hx of similar.  ? ?Aox4 ?L AKA ? ?SPO2: 88% on RA . 95% on 4L [place] ?BP: 142/82 ?HR: 91 ?HR: 22 ? ?

## 2021-04-12 NOTE — ED Notes (Signed)
[facility] called and pt placed on list. ?

## 2021-04-13 ENCOUNTER — Inpatient Hospital Stay (HOSPITAL_COMMUNITY): Payer: Medicare Other

## 2021-04-13 ENCOUNTER — Emergency Department (HOSPITAL_COMMUNITY): Payer: Medicare Other

## 2021-04-13 ENCOUNTER — Encounter (HOSPITAL_COMMUNITY): Payer: Self-pay

## 2021-04-13 ENCOUNTER — Inpatient Hospital Stay (HOSPITAL_COMMUNITY)
Admission: EM | Admit: 2021-04-13 | Discharge: 2021-05-07 | DRG: 602 | Disposition: A | Discharge status: Skilled Nursing Facility | Payer: Medicare Other | Source: Skilled Nursing Facility | Attending: Internal Medicine | Admitting: Internal Medicine

## 2021-04-13 DIAGNOSIS — R609 Edema, unspecified: Principal | ICD-10-CM

## 2021-04-13 DIAGNOSIS — L0390 Cellulitis, unspecified: Secondary | ICD-10-CM | POA: Diagnosis not present

## 2021-04-13 DIAGNOSIS — Z800 Family history of malignant neoplasm of digestive organs: Secondary | ICD-10-CM | POA: Diagnosis not present

## 2021-04-13 DIAGNOSIS — I110 Hypertensive heart disease with heart failure: Secondary | ICD-10-CM | POA: Diagnosis present

## 2021-04-13 DIAGNOSIS — Z833 Family history of diabetes mellitus: Secondary | ICD-10-CM | POA: Diagnosis not present

## 2021-04-13 DIAGNOSIS — Z7901 Long term (current) use of anticoagulants: Secondary | ICD-10-CM | POA: Diagnosis not present

## 2021-04-13 DIAGNOSIS — L03314 Cellulitis of groin: Secondary | ICD-10-CM | POA: Diagnosis present

## 2021-04-13 DIAGNOSIS — I5043 Acute on chronic combined systolic (congestive) and diastolic (congestive) heart failure: Secondary | ICD-10-CM | POA: Diagnosis present

## 2021-04-13 DIAGNOSIS — L89892 Pressure ulcer of other site, stage 2: Secondary | ICD-10-CM | POA: Diagnosis present

## 2021-04-13 DIAGNOSIS — Z86718 Personal history of other venous thrombosis and embolism: Secondary | ICD-10-CM | POA: Diagnosis not present

## 2021-04-13 DIAGNOSIS — Z803 Family history of malignant neoplasm of breast: Secondary | ICD-10-CM | POA: Diagnosis not present

## 2021-04-13 DIAGNOSIS — N433 Hydrocele, unspecified: Secondary | ICD-10-CM | POA: Diagnosis present

## 2021-04-13 DIAGNOSIS — I5032 Chronic diastolic (congestive) heart failure: Secondary | ICD-10-CM | POA: Diagnosis present

## 2021-04-13 DIAGNOSIS — Z8049 Family history of malignant neoplasm of other genital organs: Secondary | ICD-10-CM | POA: Diagnosis not present

## 2021-04-13 DIAGNOSIS — Z89612 Acquired absence of left leg above knee: Secondary | ICD-10-CM | POA: Diagnosis not present

## 2021-04-13 DIAGNOSIS — Z20822 Contact with and (suspected) exposure to covid-19: Secondary | ICD-10-CM | POA: Diagnosis present

## 2021-04-13 DIAGNOSIS — L03119 Cellulitis of unspecified part of limb: Secondary | ICD-10-CM | POA: Diagnosis present

## 2021-04-13 DIAGNOSIS — I5033 Acute on chronic diastolic (congestive) heart failure: Secondary | ICD-10-CM | POA: Diagnosis not present

## 2021-04-13 DIAGNOSIS — J9611 Chronic respiratory failure with hypoxia: Secondary | ICD-10-CM | POA: Diagnosis present

## 2021-04-13 DIAGNOSIS — E876 Hypokalemia: Secondary | ICD-10-CM | POA: Diagnosis not present

## 2021-04-13 DIAGNOSIS — I5023 Acute on chronic systolic (congestive) heart failure: Secondary | ICD-10-CM

## 2021-04-13 DIAGNOSIS — D7589 Other specified diseases of blood and blood-forming organs: Secondary | ICD-10-CM | POA: Diagnosis not present

## 2021-04-13 DIAGNOSIS — Z79890 Hormone replacement therapy (postmenopausal): Secondary | ICD-10-CM

## 2021-04-13 DIAGNOSIS — Z8249 Family history of ischemic heart disease and other diseases of the circulatory system: Secondary | ICD-10-CM

## 2021-04-13 DIAGNOSIS — Z6841 Body Mass Index (BMI) 40.0 and over, adult: Secondary | ICD-10-CM | POA: Diagnosis not present

## 2021-04-13 DIAGNOSIS — D539 Nutritional anemia, unspecified: Secondary | ICD-10-CM | POA: Diagnosis not present

## 2021-04-13 DIAGNOSIS — Z79899 Other long term (current) drug therapy: Secondary | ICD-10-CM

## 2021-04-13 DIAGNOSIS — K219 Gastro-esophageal reflux disease without esophagitis: Secondary | ICD-10-CM | POA: Diagnosis present

## 2021-04-13 DIAGNOSIS — I4891 Unspecified atrial fibrillation: Secondary | ICD-10-CM | POA: Diagnosis not present

## 2021-04-13 DIAGNOSIS — D638 Anemia in other chronic diseases classified elsewhere: Secondary | ICD-10-CM | POA: Diagnosis present

## 2021-04-13 DIAGNOSIS — R5381 Other malaise: Secondary | ICD-10-CM

## 2021-04-13 DIAGNOSIS — L89212 Pressure ulcer of right hip, stage 2: Secondary | ICD-10-CM | POA: Diagnosis present

## 2021-04-13 DIAGNOSIS — G4733 Obstructive sleep apnea (adult) (pediatric): Secondary | ICD-10-CM | POA: Diagnosis present

## 2021-04-13 DIAGNOSIS — I4819 Other persistent atrial fibrillation: Secondary | ICD-10-CM | POA: Diagnosis not present

## 2021-04-13 DIAGNOSIS — N492 Inflammatory disorders of scrotum: Secondary | ICD-10-CM | POA: Diagnosis present

## 2021-04-13 DIAGNOSIS — E039 Hypothyroidism, unspecified: Secondary | ICD-10-CM | POA: Diagnosis present

## 2021-04-13 DIAGNOSIS — Z9981 Dependence on supplemental oxygen: Secondary | ICD-10-CM

## 2021-04-13 DIAGNOSIS — N4889 Other specified disorders of penis: Secondary | ICD-10-CM | POA: Diagnosis present

## 2021-04-13 DIAGNOSIS — L8990 Pressure ulcer of unspecified site, unspecified stage: Secondary | ICD-10-CM | POA: Insufficient documentation

## 2021-04-13 DIAGNOSIS — I4821 Permanent atrial fibrillation: Secondary | ICD-10-CM | POA: Diagnosis present

## 2021-04-13 DIAGNOSIS — L03115 Cellulitis of right lower limb: Secondary | ICD-10-CM

## 2021-04-13 LAB — URINALYSIS, ROUTINE W REFLEX MICROSCOPIC
Bilirubin Urine: NEGATIVE
Glucose, UA: NEGATIVE mg/dL
Hgb urine dipstick: NEGATIVE
Ketones, ur: NEGATIVE mg/dL
Leukocytes,Ua: NEGATIVE
Nitrite: NEGATIVE
Protein, ur: NEGATIVE mg/dL
Specific Gravity, Urine: 1.009 (ref 1.005–1.030)
pH: 6 (ref 5.0–8.0)

## 2021-04-13 LAB — TROPONIN I (HIGH SENSITIVITY)
Troponin I (High Sensitivity): 6 ng/L (ref <18)
Troponin I (High Sensitivity): 7 ng/L (ref <18)

## 2021-04-13 LAB — CBC WITH DIFFERENTIAL/PLATELET
Abs Immature Granulocytes: 0.02 10*3/uL (ref 0.00–0.07)
Basophils Absolute: 0 10*3/uL (ref 0.0–0.1)
Basophils Relative: 1 %
Eosinophils Absolute: 0.1 10*3/uL (ref 0.0–0.5)
Eosinophils Relative: 2 %
HCT: 37 % — ABNORMAL LOW (ref 39.0–52.0)
Hemoglobin: 10.5 g/dL — ABNORMAL LOW (ref 13.0–17.0)
Immature Granulocytes: 1 %
Lymphocytes Relative: 19 %
Lymphs Abs: 0.7 10*3/uL (ref 0.7–4.0)
MCH: 30.7 pg (ref 26.0–34.0)
MCHC: 28.4 g/dL — ABNORMAL LOW (ref 30.0–36.0)
MCV: 108.2 fL — ABNORMAL HIGH (ref 80.0–100.0)
Monocytes Absolute: 0.5 10*3/uL (ref 0.1–1.0)
Monocytes Relative: 13 %
Neutro Abs: 2.4 10*3/uL (ref 1.7–7.7)
Neutrophils Relative %: 64 %
Platelets: 212 10*3/uL (ref 150–400)
RBC: 3.42 MIL/uL — ABNORMAL LOW (ref 4.22–5.81)
RDW: 18.6 % — ABNORMAL HIGH (ref 11.5–15.5)
WBC: 3.6 10*3/uL — ABNORMAL LOW (ref 4.0–10.5)
nRBC: 0 % (ref 0.0–0.2)

## 2021-04-13 LAB — PROTIME-INR
INR: 1.4 — ABNORMAL HIGH (ref 0.8–1.2)
Prothrombin Time: 16.8 seconds — ABNORMAL HIGH (ref 11.4–15.2)

## 2021-04-13 LAB — COMPREHENSIVE METABOLIC PANEL
ALT: 13 U/L (ref 0–44)
AST: 17 U/L (ref 15–41)
Albumin: 3 g/dL — ABNORMAL LOW (ref 3.5–5.0)
Alkaline Phosphatase: 83 U/L (ref 38–126)
Anion gap: 7 (ref 5–15)
BUN: 12 mg/dL (ref 8–23)
CO2: 32 mmol/L (ref 22–32)
Calcium: 9.1 mg/dL (ref 8.9–10.3)
Chloride: 102 mmol/L (ref 98–111)
Creatinine, Ser: 1.05 mg/dL (ref 0.61–1.24)
GFR, Estimated: >60 mL/min (ref >60)
Glucose, Bld: 119 mg/dL — ABNORMAL HIGH (ref 70–99)
Potassium: 4 mmol/L (ref 3.5–5.1)
Sodium: 141 mmol/L (ref 135–145)
Total Bilirubin: 0.8 mg/dL (ref 0.3–1.2)
Total Protein: 6.4 g/dL — ABNORMAL LOW (ref 6.5–8.1)

## 2021-04-13 LAB — MAGNESIUM: Magnesium: 2.2 mg/dL (ref 1.7–2.4)

## 2021-04-13 LAB — BRAIN NATRIURETIC PEPTIDE: B Natriuretic Peptide: 228.1 pg/mL — ABNORMAL HIGH (ref 0.0–100.0)

## 2021-04-13 LAB — RESP PANEL BY RT-PCR (FLU A&B, COVID) ARPGX2
Influenza A by PCR: NEGATIVE
Influenza B by PCR: NEGATIVE
SARS Coronavirus 2 by RT PCR: NEGATIVE

## 2021-04-13 LAB — LACTIC ACID, PLASMA
Lactic Acid, Venous: 1.3 mmol/L (ref 0.5–1.9)
Lactic Acid, Venous: 0.9 mmol/L (ref 0.5–1.9)

## 2021-04-13 LAB — TYPE AND SCREEN
ABO/RH(D): O POS
Antibody Screen: NEGATIVE

## 2021-04-13 LAB — MRSA NEXT GEN BY PCR, NASAL: MRSA by PCR Next Gen: NOT DETECTED

## 2021-04-13 MED ORDER — ASCORBIC ACID 500 MG PO TABS
500.0000 mg | ORAL_TABLET | Freq: Every day | ORAL | Status: DC
  Administered 2021-04-14 – 2021-05-07 (×24): 500 mg via ORAL
  Filled 2021-04-13 (×24): qty 1

## 2021-04-13 MED ORDER — METOPROLOL TARTRATE 50 MG PO TABS
50.0000 mg | ORAL_TABLET | Freq: Two times a day (BID) | ORAL | Status: DC
  Administered 2021-04-13 – 2021-04-16 (×6): 50 mg via ORAL
  Filled 2021-04-13 (×6): qty 1

## 2021-04-13 MED ORDER — VANCOMYCIN HCL 1500 MG/300ML IV SOLN
1500.0000 mg | Freq: Two times a day (BID) | INTRAVENOUS | Status: DC
  Administered 2021-04-14: 1500 mg via INTRAVENOUS
  Filled 2021-04-13 (×2): qty 300

## 2021-04-13 MED ORDER — LEVOTHYROXINE SODIUM 125 MCG PO TABS
125.0000 ug | ORAL_TABLET | Freq: Every day | ORAL | Status: DC
  Administered 2021-04-14 – 2021-05-07 (×24): 125 ug via ORAL
  Filled 2021-04-13 (×24): qty 1

## 2021-04-13 MED ORDER — METHOCARBAMOL 500 MG PO TABS
500.0000 mg | ORAL_TABLET | Freq: Four times a day (QID) | ORAL | Status: DC | PRN
  Administered 2021-04-16 – 2021-04-17 (×2): 500 mg via ORAL
  Filled 2021-04-13 (×2): qty 1

## 2021-04-13 MED ORDER — VANCOMYCIN HCL 2000 MG/400ML IV SOLN
2000.0000 mg | Freq: Once | INTRAVENOUS | Status: AC
  Administered 2021-04-13: 2000 mg via INTRAVENOUS
  Filled 2021-04-13: qty 400

## 2021-04-13 MED ORDER — GABAPENTIN 400 MG PO CAPS
800.0000 mg | ORAL_CAPSULE | Freq: Three times a day (TID) | ORAL | Status: DC
  Administered 2021-04-13 – 2021-05-07 (×72): 800 mg via ORAL
  Filled 2021-04-13 (×72): qty 2

## 2021-04-13 MED ORDER — ORAL CARE MOUTH RINSE
15.0000 mL | Freq: Two times a day (BID) | OROMUCOSAL | Status: DC
  Administered 2021-04-13 – 2021-05-07 (×45): 15 mL via OROMUCOSAL

## 2021-04-13 MED ORDER — APIXABAN 5 MG PO TABS
5.0000 mg | ORAL_TABLET | Freq: Two times a day (BID) | ORAL | Status: DC
  Administered 2021-04-13 – 2021-05-07 (×48): 5 mg via ORAL
  Filled 2021-04-13 (×48): qty 1

## 2021-04-13 MED ORDER — VANCOMYCIN HCL 500 MG/100ML IV SOLN
500.0000 mg | Freq: Once | INTRAVENOUS | Status: AC
  Administered 2021-04-13: 500 mg via INTRAVENOUS
  Filled 2021-04-13: qty 100

## 2021-04-13 MED ORDER — SODIUM CHLORIDE 0.9 % IV SOLN
2.0000 g | Freq: Three times a day (TID) | INTRAVENOUS | Status: DC
  Administered 2021-04-13 – 2021-04-17 (×14): 2 g via INTRAVENOUS
  Filled 2021-04-13 (×17): qty 2

## 2021-04-13 MED ORDER — ZINC GLUCONATE 50 MG PO TABS: 50.0000 mg | ORAL_TABLET | Freq: Every day | ORAL | Status: DC

## 2021-04-13 MED ORDER — SODIUM CHLORIDE (PF) 0.9 % IJ SOLN
INTRAMUSCULAR | Status: AC
  Filled 2021-04-13: qty 50

## 2021-04-13 MED ORDER — ACETAMINOPHEN 325 MG PO TABS
650.0000 mg | ORAL_TABLET | Freq: Four times a day (QID) | ORAL | Status: DC | PRN
  Administered 2021-04-13 – 2021-05-02 (×7): 650 mg via ORAL
  Filled 2021-04-13 (×8): qty 2

## 2021-04-13 MED ORDER — FUROSEMIDE 40 MG PO TABS
40.0000 mg | ORAL_TABLET | Freq: Every day | ORAL | Status: DC
  Administered 2021-04-14: 40 mg via ORAL
  Filled 2021-04-13: qty 1

## 2021-04-13 MED ORDER — PIPERACILLIN-TAZOBACTAM 3.375 G IVPB 30 MIN: 3.3750 g | Freq: Once | INTRAVENOUS | Status: DC

## 2021-04-13 MED ORDER — FUROSEMIDE 10 MG/ML IJ SOLN
40.0000 mg | Freq: Once | INTRAMUSCULAR | Status: AC
  Administered 2021-04-13: 40 mg via INTRAVENOUS
  Filled 2021-04-13: qty 4

## 2021-04-13 MED ORDER — ADULT MULTIVITAMIN W/MINERALS CH
1.0000 | ORAL_TABLET | Freq: Every day | ORAL | Status: DC
  Administered 2021-04-14 – 2021-05-07 (×24): 1 via ORAL
  Filled 2021-04-13 (×24): qty 1

## 2021-04-13 MED ORDER — ACETAMINOPHEN 650 MG RE SUPP
650.0000 mg | Freq: Four times a day (QID) | RECTAL | Status: DC | PRN
Start: 2021-04-13 — End: 2021-05-07

## 2021-04-13 MED ORDER — IOHEXOL 300 MG/ML  SOLN
100.0000 mL | Freq: Once | INTRAMUSCULAR | Status: AC | PRN
Start: 2021-04-13 — End: 2021-04-13
  Administered 2021-04-13: 100 mL via INTRAVENOUS

## 2021-04-13 MED ORDER — PIPERACILLIN-TAZOBACTAM 3.375 G IVPB
3.3750 g | Freq: Once | INTRAVENOUS | Status: DC
  Administered 2021-04-13: 3.375 g via INTRAVENOUS
  Filled 2021-04-13: qty 50

## 2021-04-13 MED ORDER — VITAMIN B-12 1000 MCG PO TABS
1000.0000 ug | ORAL_TABLET | Freq: Every day | ORAL | Status: DC
  Administered 2021-04-14 – 2021-05-07 (×24): 1000 ug via ORAL
  Filled 2021-04-13 (×24): qty 1

## 2021-04-13 NOTE — ED Notes (Signed)
EDP at the bedside to re-eval.  ?

## 2021-04-13 NOTE — ED Notes (Signed)
Hospitalist at the bedside 

## 2021-04-13 NOTE — Progress Notes (Signed)
A consult was received from an ED physician for vancomycin and zosyn per pharmacy dosing.  The patient's profile has been reviewed for ht/wt/allergies/indication/available labs.   ?A one time order has been placed for zosyn 3.375g IV x1 and vancomycin '2000mg'$  IV + '500mg'$  IV (for a total of vancomycin '2500mg'$ ).  Further antibiotics/pharmacy consults should be ordered by admitting physician if indicated.       ?                ?Thank you, ? ?Dimple Nanas, PharmD ?04/13/2021 8:43 AM ? ? ?

## 2021-04-13 NOTE — ED Notes (Signed)
EDP, Dr. Francia Greaves notified and aware pt is somnolent despite sternal rub, minimally opens eyes to pain. Pt de-satted into the high 70's mid 80's. This nurse increased O2 nasal cannula to 4L from 3.5L. No additional orders at this time. Will continue to monitor.  ?

## 2021-04-13 NOTE — ED Notes (Signed)
EDP at the bedside.  ?

## 2021-04-13 NOTE — H&P (Signed)
History and Physical    Patient: [name] ENI:[id number] DOB: 12/29/1950 DOA: 04/13/2021 DOS: the patient was seen and examined on 04/13/2021 PCP: Bernerd Limbo, MD  Patient coming from: SNF  Chief Complaint:  Chief Complaint  Patient presents with   Shortness of Breath   HPI: [name] is a 71 y.o. male with medical history significant of pAfib, Hx of DVT, chronic diastolic HF, hypothyroidism, morbid obesity, chronic hypoxic resp failure on 4L [place]. Presenting with right groin rash. History is mostly from chart review as patient is drowsy and only intermittently answering questions. He apparently has been dealing with scrotal swelling and groin edema for several months. He has made multiple ED and outpt visits for it. He has been given lasix for it. Apparently he's had increased erythema over the last few days. He came to the ED yesterday for evaluation and was thought that this was related to his CHF. So he was given increased lasix. He returned to his SNF, and his symptoms seemed to worsen. So he came back to the ED for evaluation.   Review of Systems: Unable to review all systems due to lack of cooperation from patient. Past Medical History:  Diagnosis Date   CHF (congestive heart failure) ([place]) 05/2019   Chronic anticoagulation    with Xarelto   Complication of anesthesia    PT STATES HARD TO WAKE UP AFTER ONE SUGERY -STATES THE SURGERY TOOK LONGER THAN EXPECTED.  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   Shortness of breath    WITH EXERTION AND PAIN   Sleep apnea     uses CPAP,   Past Surgical History:  Procedure Laterality Date   2010 REMOVAL OF LEFT TOTAL KNEE     AMPUTATION Left 11/17/2019   Procedure: AMPUTATION ABOVE KNEE;  Surgeon: Newt Minion, MD;  Location: [facility];  Service: Orthopedics;  Laterality: Left;   CARDIAC CATHETERIZATION  2006   CARDIOVERSION N/A 08/28/2012   Procedure: CARDIOVERSION;  Surgeon: Sanda Klein, MD;  Location: [facility];  Service: Cardiovascular;  Laterality: N/A;   CARDIOVERSION N/A 09/01/2012   Procedure: CARDIOVERSION;  Surgeon: Pixie Casino, MD;  Location: [facility];  Service: Cardiovascular;  Laterality: N/A;   COLONOSCOPY N/A 06/13/2013   Procedure: COLONOSCOPY;  Surgeon: Inda Castle, MD;  Location: [facility];  Service: Endoscopy;  Laterality: N/A;   EXCISIONAL TOTAL KNEE ARTHROPLASTY WITH ANTIBIOTIC SPACERS Left 09/21/2018   Procedure: Resection of tibia versus both components with placement of antibiotic spacer;  Surgeon: Paralee Cancel, MD;  Location: [facility];  Service: Orthopedics;  Laterality: Left;  2.5 hrs   EXCISIONAL TOTAL KNEE ARTHROPLASTY WITH ANTIBIOTIC SPACERS Left 01/02/2019   Procedure: Repeat Washout and placement of antibiotic spacer left knee;  Surgeon: Paralee Cancel, MD;  Location: [facility];  Service: Orthopedics;  Laterality: Left;  2 hrs   HYDROCELECTOY   2012   I & D EXTREMITY Left 08/14/2019   Procedure: IRRIGATION AND  DEBRIDEMENT LEFT KNEE WOUND;  Surgeon: Paralee Cancel, MD;  Location: [facility];  Service: Orthopedics;  Laterality: Left;  60 mins   I & D KNEE WITH POLY EXCHANGE Left 05/17/2019   Procedure: IRRIGATION AND DEBRIDEMENT KNEE WITH POLY EXCHANGE;  Surgeon: Paralee Cancel, MD;  Location: [facility];  Service: Orthopedics;  Laterality: Left;  90 mins   IRRIGATION AND DEBRIDEMENT KNEE Left 04/03/2019   Procedure: REIMPLANTATION OF LEFT KNEE TIBIAL COMPONENT.;  Surgeon: Paralee Cancel, MD;  Location: [facility];  Service: Orthopedics;  Laterality: Left;  need 120 min   IRRIGATION AND DEBRIDEMENT KNEE  Left 11/06/2019   Procedure: IRRIGATION AND DEBRIDEMENT KNEE;  Surgeon: Paralee Cancel, MD;  Location: [facility];  Service: Orthopedics;  Laterality: Left;  90 mins   LEFT KNEE ARTHROSCOPY  1999   LEFT KNEE SURGERY UPPER TIBIAL OSTOMY     LEFT TOTAL KNEE REMOVAL FOR INFECTION  2006   REIMPLANTATION LEFT TOTAL KNEE  2008   REIMPLANTATION LEFT TOTAL KNEE   2006   REIMPLANTATION OF TOTAL KNEE Left 06/23/2012   Procedure: REIMPLANTATION OF LEFT TOTAL KNEE;  Surgeon: Mauri Pole, MD;  Location: [facility];  Service: Orthopedics;  Laterality: Left;   REMOVAL LEFT TOTAL KNEE   2008   REPLACEMENT LEFT KNEE  2002   REPLACEMENT RIGHT KNEE  2003   REVISION LEFT KNEE CAP   2004   RIGHT KNEE ARTHROSCOPY  1998   TEE WITHOUT CARDIOVERSION N/A 08/28/2012   Procedure: TRANSESOPHAGEAL ECHOCARDIOGRAM (TEE);  Surgeon: Sanda Klein, MD;  Location: [place];  Service: Cardiovascular;  Laterality: N/A;   TONSILLECTOMY     TOTAL KNEE REVISION Left 12/15/2015   Procedure: TOTAL KNEE REVISION REPLACEMENT;  Surgeon: Paralee Cancel, MD;  Location: [facility];  Service: Orthopedics;  Laterality: Left;  Adductor Block   Social History:  reports that he has never smoked. He has never used smokeless tobacco. He reports that he does not currently use alcohol. He reports that he does not use drugs.  Allergies  Allergen Reactions   Morphine Hives    Family History  Problem Relation Age of Onset   Heart disease Father 44   Pancreatic cancer Sister    Liver cancer Sister    Cervical cancer Sister    Breast cancer Sister    Diabetes Sister    Colon cancer Neg Hx    Throat cancer Neg Hx    Stomach cancer Neg Hx    Kidney disease Neg Hx    Liver disease Neg Hx     Prior to Admission medications   Medication Sig Start Date End Date Taking? Authorizing Provider  acetaminophen (TYLENOL) 500 MG tablet Take 500 mg by mouth every 8 (eight) hours as needed for mild pain or fever.    [provider]  albuterol (VENTOLIN HFA)  108 (90 Base) MCG/ACT inhaler Inhale 2 puffs into the lungs every 6 (six) hours as needed for wheezing or shortness of breath. 01/20/21   Shelly Coss, MD  capsaicin (ZOSTRIX) 0.025 % cream Apply 1 application topically every 8 (eight) hours as needed (phantom limb pain on the left thigh/leg).    [provider]  clotrimazole (LOTRIMIN) 1 % cream Apply 1 application topically 2 (two) times daily. 03/12/21   [provider]  diclofenac Sodium (VOLTAREN) 1 % GEL Apply 4 g topically every 12 (twelve) hours. Apply to both shoulders    [provider]  diltiazem (CARDIZEM CD) 240 MG 24 hr capsule TAKE 1  CAPSULE BY MOUTH DAILY Patient taking differently: Take 240 mg by mouth daily. 10/10/18   Croitoru, Mihai, MD  furosemide (LASIX) 20 MG tablet Take 2 tablets (40 mg total) by mouth daily for 7 days. 04/12/21 04/19/21  Wyvonnia Dusky, MD  gabapentin (NEURONTIN) 800 MG tablet Take 800 mg by mouth 3 (three) times daily. 07/21/20   [provider]  guaiFENesin-dextromethorphan (ROBITUSSIN DM) 100-10 MG/5ML syrup Take 10 mLs by mouth every 4 (four) hours as needed for cough. 01/20/21   Shelly Coss, MD  levothyroxine (SYNTHROID) 125 MCG tablet Take 125 mcg by mouth daily before breakfast.  10/31/15   [provider]  Melatonin 10 MG CAPS Take 10 mg by mouth at bedtime.    [provider]  methocarbamol (ROBAXIN) 500 MG tablet Take 500 mg by mouth every 6 (six) hours as needed for muscle spasms. 12/10/20   [provider]  metoprolol tartrate (LOPRESSOR) 50 MG tablet TAKE 1 TABLET BY MOUTH TWICE DAILY Patient taking differently: Take 50 mg by mouth every 12 (twelve) hours. 12/12/18   Croitoru, Mihai, MD  Multiple Vitamin (MULTIVITAMIN WITH MINERALS) TABS Take 1 tablet by mouth daily.    [provider]  naphazoline-glycerin (CLEAR EYES REDNESS) 0.012-0.2 % SOLN Place 1-2 drops into both eyes 4 (four) times daily as needed for eye  irritation. Patient taking differently: Place 2 drops into both eyes every 6 (six) hours as needed for eye irritation. 11/21/19   Persons, Bevely Palmer, PA  oxycodone (OXY-IR) 5 MG capsule Take 5 mg by mouth every 6 (six) hours as needed for pain.    [provider]  potassium chloride (KLOR-CON M) 10 MEQ tablet Take 10 mEq by mouth daily. 01/06/21   [provider]  vitamin B-12 (CYANOCOBALAMIN) 1000 MCG tablet Take 1,000 mcg by mouth daily.    [provider]  vitamin C (ASCORBIC ACID) 500 MG tablet Take 500 mg by mouth daily.    [provider]  XARELTO 20 MG TABS tablet TAKE 1 TABLET BY MOUTH DAILY WITH BREAKFAST 10/08/19   Croitoru, Mihai, MD  zinc gluconate 50 MG tablet Take 50 mg by mouth daily.    [provider]    Physical Exam: Vitals:   04/13/21 1000 04/13/21 1115 04/13/21 1130 04/13/21 1230  BP: 126/88 117/90 127/85 94/66  Pulse: 99 86 93 70  Resp: (!) 24 (!) 21 20 (!) 21  Temp:      TempSrc:      SpO2: 90% 96% 96% 93%   General: 71 y.o. male resting in bed in NAD Eyes: PERRL, normal sclera ENMT: Nares patent w/o discharge, orophaynx clear, dentition normal, ears w/o discharge/lesions/ulcers Neck: Supple, trachea midline Cardiovascular: RRR, +S1, S2, no m/g/r, equal pulses throughout Respiratory: decreased at bases, sonorous, normal WOB on his chronic 4L.  GI: BS+, NDNT, no masses noted, no organomegaly noted MSK: No c/c; LAKA, erythema and edema of right upper thigh and groin, scrotal edema Neuro: A&O x 3, not participating in exam otherwise  Data Reviewed:  BNP  228.1 Trp 6 -> 7 Lactic acid: 1.3 -> 0.9 WBC 3.6 Hgb 10.5  CXR: Cardiomegaly. There are no signs of pulmonary edema or focal pulmonary consolidation. CT ab/pelvis w/: 1. Subcutaneous edema in the right inguinal region/perineum with mild skin thickening. No drainable fluid collection or abscess. Findings suggest early cellulitis/infectious/inflammatory process.  Clinical correlation and continued follow-up is recommended. 2. Scrotal wall thickening with possible hydrocele. Scrotal sonogram could be obtained for further  evaluation. 3.  Cholelithiasis without evidence of acute cholecystitis. 4.  Cardiomegaly with bibasilar atelectasis.   Assessment and Plan: No notes have been filed under this hospital service. Service: Hospitalist Right groin cellulitis Scrotal edema     - admit to inpt, tele     - continue vanc; change zosyn to cefepime     - follow blood Cx     - check scrotal US     - CT ab/pelvis w/o abscess, phlegmon, gas; exam w/o purulence     - got IV lasix 40 for scrotal edema; continue as '40mg'$  PO for now  Hypothyroidism     - continue home regimen A fib Hx of DVT     - continue home regimen, including eliquis  Chronic diastolic HF     - continue regimen w/ lasix modification     - received 40 lasix IV in ED for scrotal edema  OSA Chronic hypoxic respiratory failure on 3-4L [place]     - continue qHS CPAP and chronic O2 supplement   Advance Care Planning:   Code Status: FULL  Consults: None  Family Communication: None at bedside  Severity of Illness: The appropriate patient status for this patient is INPATIENT. Inpatient status is judged to be reasonable and necessary in order to provide the required intensity of service to ensure the patient's safety. The patient's presenting symptoms, physical exam findings, and initial radiographic and laboratory data in the context of their chronic comorbidities is felt to place them at high risk for further clinical deterioration. Furthermore, it is not anticipated that the patient will be medically stable for discharge from the hospital within 2 midnights of admission.   * I certify that at the point of admission it is my clinical judgment that the patient will require inpatient hospital care spanning beyond 2 midnights from the point of admission due to high intensity of service, high risk  for further deterioration and high frequency of surveillance required.*  Author: Jonnie Finner, DO 04/13/2021 1:37 PM  For on call review [email or web address].
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
## 2021-04-13 NOTE — ED Notes (Signed)
Pt is a difficult urinary cath. Unsuccessful attempt by two ED RN's as well as ED Charge RN. EDP Dr. Francia Greaves notified and aware. Pt able to urinate 700cc's or urine on his own, collected in urinal.  ?

## 2021-04-13 NOTE — ED Provider Notes (Signed)
[facility] DEPT Provider Note   CSN: [id number] Arrival date & time: 04/13/21  9892     History  Chief Complaint  Patient presents with   Shortness of Breath    [name] is a 71 y.o. male.  71 year old male with prior medical history as detailed below presents for evaluation.  Patient was seen here yesterday for increased abdominal and lower extremity edema.  Patient was prescribed an increased dose of Lasix.  At baseline he appears to take 10 mg daily.  Patient returns today complaining of increased edema to his perineum and lower abdomen.  He also complains of redness and pain to the same area.  He denies fever.  He denies significant shortness of breath.  EMS noted that he was mildly hypoxic.  Patient appears to use 2-3 liters nasal cannula O2 as needed.  The history is provided by the patient and medical records.  Illness Location:  Lower extremity edema, erythema to the lower abdomen, perineum, scrotum, thighs. Severity:  Moderate Onset quality:  Gradual Duration:  1 day Timing:  Constant Progression:  Worsening Chronicity:  New     Home Medications Prior to Admission medications   Medication Sig Start Date End Date Taking? Authorizing Provider  acetaminophen (TYLENOL) 500 MG tablet Take 500 mg by mouth every 8 (eight) hours as needed for mild pain or fever.    [provider]  albuterol (VENTOLIN HFA) 108 (90 Base) MCG/ACT inhaler Inhale 2 puffs into the lungs every 6 (six) hours as needed for wheezing or shortness of breath. 01/20/21   Shelly Coss, MD  capsaicin (ZOSTRIX) 0.025 % cream Apply 1 application topically every 8 (eight) hours as needed (phantom limb pain on the left thigh/leg).    [provider]  clotrimazole (LOTRIMIN) 1 % cream Apply 1 application topically 2 (two) times daily. 03/12/21   [provider]  diclofenac Sodium (VOLTAREN) 1 % GEL Apply 4 g topically every 12 (twelve)  hours. Apply to both shoulders    [provider]  diltiazem (CARDIZEM CD) 240 MG 24 hr capsule TAKE 1 CAPSULE BY MOUTH DAILY Patient taking differently: Take 240 mg by mouth daily. 10/10/18   Croitoru, Mihai, MD  furosemide (LASIX) 20 MG tablet Take 2 tablets (40 mg total) by mouth daily for 7 days. 04/12/21 04/19/21  Wyvonnia Dusky, MD  gabapentin (NEURONTIN) 800 MG tablet Take 800 mg by mouth 3 (three) times daily. 07/21/20   [provider]  guaiFENesin-dextromethorphan (ROBITUSSIN DM) 100-10 MG/5ML syrup Take 10 mLs by mouth every 4 (four) hours as needed for cough. 01/20/21   Shelly Coss, MD  levothyroxine (SYNTHROID) 125 MCG tablet Take 125 mcg by mouth daily before breakfast.  10/31/15   [provider]  Melatonin 10 MG CAPS Take 10 mg by mouth at bedtime.    [provider]  methocarbamol (ROBAXIN) 500 MG tablet Take 500 mg by mouth every 6 (six) hours as needed for muscle spasms. 12/10/20   [provider]  metoprolol tartrate (LOPRESSOR) 50 MG tablet TAKE 1 TABLET BY MOUTH TWICE DAILY Patient taking differently: Take 50 mg by mouth every 12 (twelve) hours. 12/12/18   Croitoru, Mihai, MD  Multiple Vitamin (MULTIVITAMIN WITH MINERALS) TABS Take 1 tablet by mouth daily.    [provider]  naphazoline-glycerin (CLEAR EYES REDNESS) 0.012-0.2 % SOLN Place 1-2 drops into both eyes 4 (four) times daily as needed for eye irritation. Patient taking differently: Place 2 drops into both  eyes every 6 (six) hours as needed for eye irritation. 11/21/19   Persons, Bevely Palmer, PA  oxycodone (OXY-IR) 5 MG capsule Take 5 mg by mouth every 6 (six) hours as needed for pain.    [provider]  potassium chloride (KLOR-CON M) 10 MEQ tablet Take 10 mEq by mouth daily. 01/06/21   [provider]  vitamin B-12 (CYANOCOBALAMIN) 1000 MCG tablet Take 1,000 mcg by mouth daily.    [provider]  vitamin C (ASCORBIC ACID) 500 MG tablet  Take 500 mg by mouth daily.    [provider]  XARELTO 20 MG TABS tablet TAKE 1 TABLET BY MOUTH DAILY WITH BREAKFAST 10/08/19   Croitoru, Mihai, MD  zinc gluconate 50 MG tablet Take 50 mg by mouth daily.    [provider]      Allergies    Morphine    Review of Systems   Review of Systems  Physical Exam Updated Vital Signs BP 94/66    Pulse 70    Temp 98.2 F (36.8 C) (Oral)    Resp (!) 21    SpO2 93%  Physical Exam Vitals and nursing note reviewed.  Constitutional:      General: He is not in acute distress.    Appearance: Normal appearance. He is well-developed.  HENT:     Head: Normocephalic and atraumatic.  Eyes:     Conjunctiva/sclera: Conjunctivae normal.     Pupils: Pupils are equal, round, and reactive to light.  Cardiovascular:     Rate and Rhythm: Normal rate and regular rhythm.     Heart sounds: Normal heart sounds.  Pulmonary:     Effort: Pulmonary effort is normal. No respiratory distress.     Breath sounds: Normal breath sounds.  Abdominal:     General: There is no distension.     Palpations: Abdomen is soft.     Tenderness: There is no abdominal tenderness.  Musculoskeletal:        General: No deformity. Normal range of motion.     Cervical back: Normal range of motion and neck supple.     Comments: Status post left AKA amputation  Significant erythema overlying the lower abdomen, perineum, scrotum, and groin.    Skin:    General: Skin is warm and dry.  Neurological:     General: No focal deficit present.     Mental Status: He is alert and oriented to person, place, and time.    ED Results / Procedures / Treatments   Labs (all labs ordered are listed, but only abnormal results are displayed) Labs Reviewed  COMPREHENSIVE METABOLIC PANEL - Abnormal; Notable for the following components:      Result Value   Glucose, Bld 119 (*)    Total Protein 6.4 (*)    Albumin 3.0 (*)    All other components within normal limits  CBC WITH  DIFFERENTIAL/PLATELET - Abnormal; Notable for the following components:   WBC 3.6 (*)    RBC 3.42 (*)    Hemoglobin 10.5 (*)    HCT 37.0 (*)    MCV 108.2 (*)    MCHC 28.4 (*)    RDW 18.6 (*)    All other components within normal limits  BRAIN NATRIURETIC PEPTIDE - Abnormal; Notable for the following components:   B Natriuretic Peptide 228.1 (*)    All other components within normal limits  PROTIME-INR - Abnormal; Notable for the following components:   Prothrombin Time 16.8 (*)  INR 1.4 (*)    All other components within normal limits  RESP PANEL BY RT-PCR (FLU A&B, COVID) ARPGX2  CULTURE, BLOOD (ROUTINE X 2)  CULTURE, BLOOD (ROUTINE X 2)  URINE CULTURE  URINALYSIS, ROUTINE W REFLEX MICROSCOPIC  LACTIC ACID, PLASMA  LACTIC ACID, PLASMA  TYPE AND SCREEN  TROPONIN I (HIGH SENSITIVITY)  TROPONIN I (HIGH SENSITIVITY)    EKG EKG Interpretation  Date/Time:  Monday April 13 2021 08:32:07 EST Ventricular Rate:  86 PR Interval:    QRS Duration: 116 QT Interval:  407 QTC Calculation: 487 R Axis:   -39 Text Interpretation: Atrial fibrillation Nonspecific IVCD with LAD Low voltage, extremity and precordial leads Consider anterior infarct Confirmed by Dene Gentry [phone]) on 04/13/2021 9:04:59 AM  Radiology CT ABDOMEN PELVIS W CONTRAST  Result Date: 04/13/2021 CLINICAL DATA:  Abdominal pain, acute nonlocalized. Perineum pain/erythema. Evaluation for infection. EXAM: CT ABDOMEN AND PELVIS WITH CONTRAST TECHNIQUE: Multidetector CT imaging of the abdomen and pelvis was performed using the standard protocol following bolus administration of intravenous contrast. RADIATION DOSE REDUCTION: This exam was performed according to the departmental dose-optimization program which includes automated exposure control, adjustment of the mA and/or kV according to patient size and/or use of iterative reconstruction technique. CONTRAST:  161m OMNIPAQUE IOHEXOL 300 MG/ML  SOLN COMPARISON:  CT  examination dated January 25, 2013 FINDINGS: Lower chest: Bibasilar subsegmental atelectasis. Heart is enlarged. Coronary artery atherosclerotic calcifications. Hepatobiliary: No focal liver abnormality is seen. Multiple gallstones. No gallbladder wall thickening or pericholecystic inflammatory changes. No biliary ductal dilatation. Pancreas: Moderate pancreatic atrophy. No pancreatic ductal dilatation or surrounding inflammatory changes. Spleen: Normal in size without focal abnormality. Adrenals/Urinary Tract: Adrenal glands are unremarkable. Kidneys are normal, without renal calculi, focal lesion, or hydronephrosis. Bladder is unremarkable. Stomach/Bowel: Stomach is within normal limits. Appendix appears normal. No evidence of bowel wall thickening, distention, or inflammatory changes. Vascular/Lymphatic: Aortic atherosclerosis. No enlarged abdominal or pelvic lymph nodes. Reproductive: Prostate is unremarkable. Scrotal wall thickening with possible hydrocele. Other: There is subcutaneous soft tissue swelling prominent in the right groin/inguinal region. No drainable fluid collection or abscess. There are few mildly enlarged lymph node with fatty hilum, likely reactive. No abdominal wall hernia or abnormality. No abdominopelvic ascites. Musculoskeletal: Multilevel degenerative disc disease of the lumbar spine. No acute osseous abnormality. IMPRESSION: 1. Subcutaneous edema in the right inguinal region/perineum with mild skin thickening. No drainable fluid collection or abscess. Findings suggest early cellulitis/infectious/inflammatory process. Clinical correlation and continued follow-up is recommended. 2. Scrotal wall thickening with possible hydrocele. Scrotal sonogram could be obtained for further evaluation. 3.  Cholelithiasis without evidence of acute cholecystitis. 4.  Cardiomegaly with bibasilar atelectasis. Electronically Signed   By: IKeane PoliceD.O.   On: 04/13/2021 12:24   DG Chest Port 1  View  Result Date: 04/13/2021 CLINICAL DATA:  Shortness of breath EXAM: PORTABLE CHEST 1 VIEW COMPARISON:  01/15/2021 FINDINGS: Transverse diameter of heart is increased. There are no signs of pulmonary edema or focal pulmonary consolidation. There is poor inspiration. Left lateral CP angle is indistinct. This may be due to pleural thickening or minimal effusion. There is no pneumothorax. IMPRESSION: Cardiomegaly. There are no signs of pulmonary edema or focal pulmonary consolidation. Electronically Signed   By: PElmer PickerM.D.   On: 04/13/2021 09:20    Procedures Procedures    Medications Ordered in ED Medications  vancomycin (VANCOREADY) IVPB 2000 mg/400 mL (2,000 mg Intravenous New Bag/Given 04/13/21 1205)    And  vancomycin (VANCOREADY) IVPB  500 mg/100 mL (has no administration in time range)  sodium chloride (PF) 0.9 % injection (  Not Given 04/13/21 1156)  piperacillin-tazobactam (ZOSYN) IVPB 3.375 g (3.375 g Intravenous Not Given 04/13/21 1301)  furosemide (LASIX) injection 40 mg (has no administration in time range)  iohexol (OMNIPAQUE) 300 MG/ML solution 100 mL (100 mLs Intravenous Contrast Given 04/13/21 1142)    ED Course/ Medical Decision Making/ A&P                           Medical Decision Making Amount and/or Complexity of Data Reviewed Labs: ordered. Radiology: ordered.  Risk Prescription drug management. Decision regarding hospitalization.    Medical Screen Complete  This patient presented to the ED with complaint of lower abdominal edema and erythema..  This complaint involves an extensive number of treatment options. The initial differential diagnosis includes, but is not limited to, increased edema, infection, cellulitis, deep tissue infection, metabolic abnormality, etc.  This presentation is: Acute, Chronic, Self-Limited, Previously Undiagnosed, Uncertain Prognosis, Complicated, Systemic Symptoms, and Threat to Life/Bodily Function  Patient is  presenting with complaint of increased lower extremity edema, increasing erythema to the lower abdomen and perineum.  Presentation is concerning for possible acute cellulitis and/or Fournier's.  Broad-spectrum antibiotics initiated.  Screening labs obtained are without significant acute abnormality.  CT imaging is without evidence of deep tissue infection.  Patient is able to urinate without difficulty.  This despite significant penile edema.  Patient would benefit from admission for further work-up.  Patient would likely benefit from diuresis and continued antibiotics.  Hospitalist services were case will evaluate for admission.  Co morbidities that complicated the patient's evaluation  Morbid obesity, CHF   Additional history obtained:  Additional history obtained from EMS External records from outside sources obtained and reviewed including prior ED visits and prior Inpatient records.    Lab Tests:  I ordered and personally interpreted labs.  The pertinent results include: CBC, CMP, BNP, troponin   Imaging Studies ordered:  I ordered imaging studies including CT abdomen pelvis I independently visualized and interpreted obtained imaging which showed no deep tissue infection I agree with the radiologist interpretation.   Cardiac Monitoring:  The patient was maintained on a cardiac monitor.  I personally viewed and interpreted the cardiac monitor which showed an underlying rhythm of: AFIB   Medicines ordered:  I ordered medication including abx and Lasix  for suspected cellulitis and diuresis Reevaluation of the patient after these medicines showed that the patient: improved    Problem List / ED Course:  Edema, cellulitis   Reevaluation:  After the interventions noted above, I reevaluated the patient and found that they have: improved  Disposition:  After consideration of the diagnostic results and the patients response to treatment, I feel that the  patent would benefit from admission.   CRITICAL CARE Performed by: Valarie Merino   Total critical care time: 30 minutes  Critical care time was exclusive of separately billable procedures and treating other patients.  Critical care was necessary to treat or prevent imminent or life-threatening deterioration.  Critical care was time spent personally by me on the following activities: development of treatment plan with patient and/or surrogate as well as nursing, discussions with consultants, evaluation of patient's response to treatment, examination of patient, obtaining history from patient or surrogate, ordering and performing treatments and interventions, ordering and review of laboratory studies, ordering and review of radiographic studies, pulse oximetry and re-evaluation of  patient's condition.        Final Clinical Impression(s) / ED Diagnoses Final diagnoses:  Edema, unspecified type  Cellulitis, unspecified cellulitis site    Rx / DC Orders ED Discharge Orders     None         Valarie Merino, MD 04/13/21 1343

## 2021-04-13 NOTE — Progress Notes (Signed)
Patient declines the use of nocturnal CPAP tonight. He is upset that he is staying the night at the hospital and staff will not "go to [place] and pick up coffee and donuts". RN aware. Equipment is at the bedside. He is aware that he may call for assistance at anytime should he require assistance and wish to wear it. RT will continue to follow.  ?

## 2021-04-13 NOTE — Progress Notes (Signed)
Pharmacy Antibiotic Note ? ?[name] is a 71 y.o. male admitted on 04/13/2021 with cellulitis.  Pharmacy has been consulted for vanc/cefepime dosing. ? ?Plan: ?Vanc '2500mg'$  already ordered in ER, start '1500mg'$  IV q12 thereafter goal AUC 400-550 ?Cefepime 2g IV q8 per current renal funciton ? ?  ? ?Temp (24hrs), Avg:98.2 ?F (36.8 ?C), Min:98.2 ?F (36.8 ?C), Max:98.2 ?F (36.8 ?C) ? ?Recent Labs  ?Lab 04/13/21 ?0800 04/13/21 ?0902 04/13/21 ?1215  ?WBC 3.6*  --   --   ?CREATININE 1.05  --   --   ?LATICACIDVEN  --  1.3 0.9  ?  ?CrCl cannot be calculated (Unknown ideal weight.).   ? ?Allergies  ?Allergen Reactions  ? Morphine Hives  ? ? ? ?Thank you for allowing pharmacy to be a part of this patient?s care. ? ?Kara Mead ?04/13/2021 3:35 PM ? ?

## 2021-04-13 NOTE — ED Notes (Signed)
To CT at this time.

## 2021-04-13 NOTE — Plan of Care (Signed)
New admission 1514.  ? ?Problem: Education: ?Goal: Knowledge of General Education information will improve ?Description: Including pain rating scale, medication(s)/side effects and non-pharmacologic comfort measures ?Outcome: Progressing ?  ?Problem: Health Behavior/Discharge Planning: ?Goal: Ability to manage health-related needs will improve ?Outcome: Progressing ?  ?Problem: Clinical Measurements: ?Goal: Ability to maintain clinical measurements within normal limits will improve ?Outcome: Progressing ?Goal: Will remain free from infection ?Outcome: Progressing ?Goal: Diagnostic test results will improve ?Outcome: Progressing ?Goal: Respiratory complications will improve ?Outcome: Progressing ?Goal: Cardiovascular complication will be avoided ?Outcome: Progressing ?  ?Problem: Activity: ?Goal: Risk for activity intolerance will decrease ?Outcome: Progressing ?  ?Problem: Nutrition: ?Goal: Adequate nutrition will be maintained ?Outcome: Progressing ?  ?Problem: Coping: ?Goal: Level of anxiety will decrease ?Outcome: Progressing ?  ?Problem: Elimination: ?Goal: Will not experience complications related to bowel motility ?Outcome: Progressing ?Goal: Will not experience complications related to urinary retention ?Outcome: Progressing ?  ?Problem: Pain Managment: ?Goal: General experience of comfort will improve ?Outcome: Progressing ?  ?Problem: Safety: ?Goal: Ability to remain free from injury will improve ?Outcome: Progressing ?  ?Problem: Skin Integrity: ?Goal: Risk for impaired skin integrity will decrease ?Outcome: Progressing ?  ?

## 2021-04-14 DIAGNOSIS — L03314 Cellulitis of groin: Secondary | ICD-10-CM | POA: Diagnosis not present

## 2021-04-14 DIAGNOSIS — I5032 Chronic diastolic (congestive) heart failure: Secondary | ICD-10-CM | POA: Diagnosis not present

## 2021-04-14 DIAGNOSIS — J9611 Chronic respiratory failure with hypoxia: Secondary | ICD-10-CM

## 2021-04-14 DIAGNOSIS — E039 Hypothyroidism, unspecified: Secondary | ICD-10-CM

## 2021-04-14 DIAGNOSIS — L8990 Pressure ulcer of unspecified site, unspecified stage: Secondary | ICD-10-CM | POA: Insufficient documentation

## 2021-04-14 DIAGNOSIS — I4819 Other persistent atrial fibrillation: Secondary | ICD-10-CM

## 2021-04-14 DIAGNOSIS — Z6841 Body Mass Index (BMI) 40.0 and over, adult: Secondary | ICD-10-CM

## 2021-04-14 LAB — COMPREHENSIVE METABOLIC PANEL
ALT: 12 U/L (ref 0–44)
AST: 18 U/L (ref 15–41)
Albumin: 2.9 g/dL — ABNORMAL LOW (ref 3.5–5.0)
Alkaline Phosphatase: 76 U/L (ref 38–126)
Anion gap: 7 (ref 5–15)
BUN: 12 mg/dL (ref 8–23)
CO2: 34 mmol/L — ABNORMAL HIGH (ref 22–32)
Calcium: 8.9 mg/dL (ref 8.9–10.3)
Chloride: 98 mmol/L (ref 98–111)
Creatinine, Ser: 0.99 mg/dL (ref 0.61–1.24)
GFR, Estimated: >60 mL/min (ref >60)
Glucose, Bld: 85 mg/dL (ref 70–99)
Potassium: 4.3 mmol/L (ref 3.5–5.1)
Sodium: 139 mmol/L (ref 135–145)
Total Bilirubin: 1.1 mg/dL (ref 0.3–1.2)
Total Protein: 6.6 g/dL (ref 6.5–8.1)

## 2021-04-14 LAB — CBC
HCT: 38.1 % — ABNORMAL LOW (ref 39.0–52.0)
Hemoglobin: 10.8 g/dL — ABNORMAL LOW (ref 13.0–17.0)
MCH: 30.9 pg (ref 26.0–34.0)
MCHC: 28.3 g/dL — ABNORMAL LOW (ref 30.0–36.0)
MCV: 109.2 fL — ABNORMAL HIGH (ref 80.0–100.0)
Platelets: 225 10*3/uL (ref 150–400)
RBC: 3.49 MIL/uL — ABNORMAL LOW (ref 4.22–5.81)
RDW: 18.5 % — ABNORMAL HIGH (ref 11.5–15.5)
WBC: 4 10*3/uL (ref 4.0–10.5)
nRBC: 0 % (ref 0.0–0.2)

## 2021-04-14 LAB — URINE CULTURE: Culture: <10000 — AB

## 2021-04-14 MED ORDER — FUROSEMIDE 10 MG/ML IJ SOLN
40.0000 mg | Freq: Two times a day (BID) | INTRAMUSCULAR | Status: DC
  Administered 2021-04-14 – 2021-04-19 (×10): 40 mg via INTRAVENOUS
  Filled 2021-04-14 (×11): qty 4

## 2021-04-14 MED ORDER — VANCOMYCIN HCL 1250 MG/250ML IV SOLN
1250.0000 mg | Freq: Two times a day (BID) | INTRAVENOUS | Status: DC
  Administered 2021-04-14 – 2021-04-17 (×6): 1250 mg via INTRAVENOUS
  Filled 2021-04-14 (×8): qty 250

## 2021-04-14 NOTE — Assessment & Plan Note (Signed)
History of DVT -Continue metoprolol and Eliquis

## 2021-04-14 NOTE — Hospital Course (Addendum)
71 y.o. male with medical history significant of pAfib, Hx of DVT, chronic diastolic HF, hypothyroidism, morbid obesity, chronic hypoxic respiratory failure on 4L [place], intermittent scrotal and groin swelling for several months requiring multiple ED visits presented with increased shortness of breath along with groin swelling and redness.  On presentation, BNP 228.1; chest x-ray showed no signs of pulmonary edema or focal pulmonary consolidation.  CT of abdomen/pelvis showed subcutaneous edema in the right inguinal region/perineum with mild skin thickening but no drainable fluid collection or abscess along with scrotal wall thickening with possible hydrocele.  Urology was consulted.  Echo showed EF of 45 to 50%.  Cardiology was consulted on 04/16/2021.

## 2021-04-14 NOTE — Assessment & Plan Note (Addendum)
-   Strict input and output.  Daily weights.  Fluid restriction.  Negative balance of 5592.9 cc since admission.  Continue metoprolol.  Continue IV Lasix.  Monitor creatinine. ?-Follow 2D echo ?

## 2021-04-14 NOTE — Assessment & Plan Note (Signed)
OSA -Normally uses 3 to 4 L oxygen via nasal cannula during daytime and CPAP at night.  Continue same.

## 2021-04-14 NOTE — Consult Note (Signed)
Urology Consult   Physician requesting consult: Cherylann Ratel  Reason for consult: Scrotal edema  History of Present Illness: [name] is a 71 y.o. morbidly obese male with PMH significant for CHF, afib on Xarelto, hx of DVT, left AKA, and chronic hypoxic resp failure on 4L via [place] who was admitted yesterday for groin/scrotal edema and erythema.  Per pt this began yesterday morning.  He thinks he has had one other episode similar to this was that treated with ABx. He was admitted and started on Vanc and Cefepime, his lasix dose was increased, and blood cultures are pending.   CT A/P without fluid collections or abscesses.  Scrotal U/S shows complex multiloculated cystic structure in the right scrotum consistent with complex hydrocele and a 2.1cm cystic area adjacent to the right testicle both of which are unchanged from previous U/S.  He states he is able to void without difficulty and denies hx of open wounds.  He is currently resting comfortably.  He denies F/C, CP, SOB, N/V, diarrhea/constipation, and abdominal pain.     Past Medical History:  Diagnosis Date   CHF (congestive heart failure) ([facility]) 05/2019   Chronic anticoagulation    with Xarelto   Complication of anesthesia    PT STATES HARD TO WAKE UP AFTER ONE SUGERY -STATES THE SURGERY TOOK LONGER THAN EXPECTED.  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   Shortness of breath    WITH EXERTION AND PAIN   Sleep apnea    uses CPAP,    Past Surgical History:  Procedure Laterality Date   2010 REMOVAL OF LEFT TOTAL KNEE      AMPUTATION Left 11/17/2019   Procedure: AMPUTATION ABOVE KNEE;  Surgeon: Newt Minion, MD;  Location: [facility];  Service: Orthopedics;  Laterality: Left;   CARDIAC CATHETERIZATION  2006   CARDIOVERSION N/A 08/28/2012   Procedure: CARDIOVERSION;  Surgeon: Sanda Klein, MD;  Location: [facility];  Service: Cardiovascular;  Laterality: N/A;   CARDIOVERSION N/A 09/01/2012   Procedure: CARDIOVERSION;  Surgeon: Pixie Casino, MD;  Location: [facility];  Service: Cardiovascular;  Laterality: N/A;   COLONOSCOPY N/A 06/13/2013   Procedure: COLONOSCOPY;  Surgeon: Inda Castle, MD;  [facility];  Service: Endoscopy;  Laterality: N/A;   EXCISIONAL TOTAL KNEE ARTHROPLASTY WITH ANTIBIOTIC SPACERS Left 09/21/2018   Procedure: Resection of tibia versus both components with placement of antibiotic spacer;  Surgeon: Paralee Cancel, MD;  [facility];  Service: Orthopedics;  Laterality: Left;  2.5 hrs   EXCISIONAL TOTAL KNEE ARTHROPLASTY WITH ANTIBIOTIC SPACERS Left 01/02/2019   Procedure: Repeat Washout and placement of antibiotic spacer left knee;  Surgeon: Paralee Cancel, MD;  [facility];  Service: Orthopedics;  Laterality: Left;  2 hrs   HYDROCELECTOY   2012   I & D EXTREMITY Left 08/14/2019   Procedure: IRRIGATION AND DEBRIDEMENT LEFT KNEE WOUND;  Surgeon: Paralee Cancel, MD;  [facility];  Service: Orthopedics;  Laterality:  Left;  60 mins   I & D KNEE WITH POLY EXCHANGE Left 05/17/2019   Procedure: IRRIGATION AND DEBRIDEMENT KNEE WITH POLY EXCHANGE;  Surgeon: Paralee Cancel, MD;  [facility];  Service: Orthopedics;  Laterality: Left;  90 mins   IRRIGATION AND DEBRIDEMENT KNEE Left 04/03/2019   Procedure: REIMPLANTATION OF LEFT KNEE TIBIAL COMPONENT.;  Surgeon: Paralee Cancel, MD;  [facility];  Service: Orthopedics;  Laterality: Left;  need 120 min   IRRIGATION AND DEBRIDEMENT KNEE Left 11/06/2019   Procedure: IRRIGATION AND DEBRIDEMENT KNEE;  Surgeon: Paralee Cancel, MD;  [facility];  Service: Orthopedics;  Laterality: Left;  90 mins   LEFT KNEE ARTHROSCOPY  1999   LEFT KNEE SURGERY UPPER TIBIAL OSTOMY     LEFT TOTAL KNEE REMOVAL FOR INFECTION  2006   REIMPLANTATION LEFT TOTAL KNEE  2008   REIMPLANTATION LEFT TOTAL KNEE   2006   REIMPLANTATION OF TOTAL KNEE Left 06/23/2012   Procedure: REIMPLANTATION OF LEFT TOTAL KNEE;  Surgeon: Mauri Pole, MD;  [facility];  Service: Orthopedics;  Laterality: Left;   REMOVAL LEFT TOTAL KNEE   2008   REPLACEMENT LEFT KNEE  2002   REPLACEMENT RIGHT KNEE  2003   REVISION LEFT KNEE CAP   2004   RIGHT KNEE ARTHROSCOPY  1998   TEE WITHOUT CARDIOVERSION N/A 08/28/2012   Procedure: TRANSESOPHAGEAL ECHOCARDIOGRAM (TEE);  Surgeon: Sanda Klein, MD;  Location: [facility];  Service: Cardiovascular;  Laterality: N/A;   TONSILLECTOMY     TOTAL KNEE REVISION Left 12/15/2015   Procedure: TOTAL KNEE REVISION REPLACEMENT;  Surgeon: Paralee Cancel, MD;  [facility];  Service: Orthopedics;  Laterality: Left;  Adductor Block    Current Hospital Medications:  Home Meds:  . Current Meds  Medication Sig   acetaminophen (TYLENOL) 500 MG tablet Take 500 mg by mouth every 6 (six) hours as needed for mild pain or fever.   apixaban (ELIQUIS) 5 MG TABS tablet Take 5 mg by mouth 2 (two) times daily.   capsaicin (ZOSTRIX) 0.025 % cream Apply 1 application topically every 8 (eight) hours as needed (phantom limb pain on the left thigh/leg).   clotrimazole (LOTRIMIN) 1 % cream Apply 1 application topically 2 (two) times daily.   furosemide (LASIX) 20 MG tablet Take 2 tablets (40 mg total) by mouth daily for 7 days.   gabapentin (NEURONTIN) 800 MG tablet Take 800 mg by mouth 3 (three) times daily.   guaiFENesin-dextromethorphan (ROBITUSSIN DM) 100-10 MG/5ML syrup Take 10 mLs by mouth every 4 (four) hours as needed for cough. (Patient taking differently: Take 20 mLs by mouth every 4 (four) hours as needed for cough.)   levothyroxine (SYNTHROID) 125 MCG  tablet Take 125 mcg by mouth daily before breakfast.    Melatonin 10 MG CAPS Take 10 mg by mouth at bedtime.   methocarbamol (ROBAXIN) 500 MG tablet Take 500 mg by mouth every 6 (six) hours as needed for muscle spasms.   metoprolol tartrate (LOPRESSOR) 50 MG tablet TAKE 1 TABLET BY MOUTH TWICE DAILY (Patient taking differently: Take 50 mg by mouth every 12 (twelve) hours.)   Multiple Vitamin (MULTIVITAMIN WITH MINERALS) TABS Take 1 tablet by mouth daily.   potassium chloride (KLOR-CON M) 10 MEQ tablet Take 10 mEq by mouth daily.   vitamin B-12 (CYANOCOBALAMIN) 1000 MCG tablet Take 1,000 mcg by mouth daily.   vitamin C (ASCORBIC ACID) 500 MG tablet Take 500 mg by mouth daily.   zinc gluconate  50 MG tablet Take 50 mg by mouth daily.     Scheduled Meds:  apixaban  5 mg Oral BID   vitamin C  500 mg Oral Daily   furosemide  40 mg Oral Daily   gabapentin  800 mg Oral TID   levothyroxine  125 mcg Oral Q0600   mouth rinse  15 mL Mouth Rinse BID   metoprolol tartrate  50 mg Oral Q12H   multivitamin with minerals  1 tablet Oral Daily   vitamin B-12  1,000 mcg Oral Daily   Continuous Infusions:  ceFEPime (MAXIPIME) IV 2 g (04/14/21 0742)   vancomycin     PRN Meds:.acetaminophen **OR** acetaminophen, methocarbamol  Allergies:  Allergies  Allergen Reactions   Morphine Hives    Family History  Problem Relation Age of Onset   Heart disease Father 4   Pancreatic cancer Sister    Liver cancer Sister    Cervical cancer Sister    Breast cancer Sister    Diabetes Sister    Colon cancer Neg Hx    Throat cancer Neg Hx    Stomach cancer Neg Hx    Kidney disease Neg Hx    Liver disease Neg Hx     Social History:  reports that he has never smoked. He has never used smokeless tobacco. He reports that he does not currently use alcohol. He reports that he does not use drugs.  ROS: A complete review of systems was performed.  All systems are negative except for pertinent findings as  noted.  Physical Exam:  Vital signs in last 24 hours: Temp:  [97.8 F (36.6 C)-98.7 F (37.1 C)] 98.1 F (36.7 C) (03/07 0435) Pulse Rate:  [62-93] 72 (03/07 0435) Resp:  [16-25] 20 (03/07 0435) BP: (94-131)/(57-88) 95/73 (03/07 0435) SpO2:  [90 %-97 %] 94 % (03/07 0435) Weight:  [182.3 kg] 182.3 kg (03/07 0500) Constitutional:  Alert and oriented, No acute distress Cardiovascular: [place] Respiratory: Normal respiratory effort, on O2 via [place] GI: Abdomen is obese, soft, nontender, nondistended, no abdominal masses GU: condom cath in place, clear urine in bag, mild erythema throughout groin, no skin breakdown, significant edema in bilateral thighs, moderate edema in scrotum which is firm but non tender Lymphatic: unable to assess due to edema Neurologic: Grossly intact, no focal deficits Psychiatric: Normal mood and affect  Laboratory Data:  Recent Labs    04/13/21 0800 04/14/21 0528  WBC 3.6* 4.0  HGB 10.5* 10.8*  HCT 37.0* 38.1*  PLT 212 225    Recent Labs    04/13/21 0800 04/14/21 0528  NA 141 139  K 4.0 4.3  CL 102 98  GLUCOSE 119* 85  BUN 12 12  CALCIUM 9.1 8.9  CREATININE 1.05 0.99     Results for orders placed or performed during the hospital encounter of 04/13/21 (from the past 24 hour(s))  Lactic acid, plasma     Status: None   Collection Time: 04/13/21 12:15 PM  Result Value Ref Range   Lactic Acid, Venous 0.9 0.5 - 1.9 mmol/L  Troponin I (High Sensitivity)     Status: None   Collection Time: 04/13/21 12:15 PM  Result Value Ref Range   Troponin I (High Sensitivity) 7 <18 ng/L  Magnesium     Status: None   Collection Time: 04/13/21 12:23 PM  Result Value Ref Range   Magnesium 2.2 1.7 - 2.4 mg/dL  MRSA Next Gen by PCR, Nasal     Status: None   Collection  Time: 04/13/21  6:14 PM   Specimen: Nasal Mucosa; Nasal Swab  Result Value Ref Range   MRSA by PCR Next Gen NOT DETECTED NOT DETECTED  Comprehensive metabolic panel     Status: Abnormal    Collection Time: 04/14/21  5:28 AM  Result Value Ref Range   Sodium 139 135 - 145 mmol/L   Potassium 4.3 3.5 - 5.1 mmol/L   Chloride 98 98 - 111 mmol/L   CO2 34 (H) 22 - 32 mmol/L   Glucose, Bld 85 70 - 99 mg/dL   BUN 12 8 - 23 mg/dL   Creatinine, Ser 0.99 0.61 - 1.24 mg/dL   Calcium 8.9 8.9 - 10.3 mg/dL   Total Protein 6.6 6.5 - 8.1 g/dL   Albumin 2.9 (L) 3.5 - 5.0 g/dL   AST 18 15 - 41 U/L   ALT 12 0 - 44 U/L   Alkaline Phosphatase 76 38 - 126 U/L   Total Bilirubin 1.1 0.3 - 1.2 mg/dL   GFR, Estimated >60 >60 mL/min   Anion gap 7 5 - 15  CBC     Status: Abnormal   Collection Time: 04/14/21  5:28 AM  Result Value Ref Range   WBC 4.0 4.0 - 10.5 K/uL   RBC 3.49 (L) 4.22 - 5.81 MIL/uL   Hemoglobin 10.8 (L) 13.0 - 17.0 g/dL   HCT 38.1 (L) 39.0 - 52.0 %   MCV 109.2 (H) 80.0 - 100.0 fL   MCH 30.9 26.0 - 34.0 pg   MCHC 28.3 (L) 30.0 - 36.0 g/dL   RDW 18.5 (H) 11.5 - 15.5 %   Platelets 225 150 - 400 K/uL   nRBC 0.0 0.0 - 0.2 %   Recent Results (from the past 240 hour(s))  Urine Culture     Status: Abnormal   Collection Time: 04/13/21  9:03 AM   Specimen: Urine, Catheterized  Result Value Ref Range Status   Specimen Description   Final    URINE, CATHETERIZED Performed at [facility], [place] [address], [place], [place] [address]    Special Requests   Final    NONE Performed at [facility], [place] [address] [place], [place] [address]    Culture (A)  Final    <10,000 COLONIES/mL INSIGNIFICANT GROWTH Performed at [facility] [address] [place], [place] [address]    Report Status 04/14/2021 FINAL  Final  Resp Panel by RT-PCR (Flu A&B, Covid) Nasopharyngeal Swab     Status: None   Collection Time: 04/13/21  9:05 AM   Specimen: Nasopharyngeal Swab; Nasopharyngeal(NP) swabs in vial transport medium  Result Value Ref Range Status   SARS Coronavirus 2 by RT PCR NEGATIVE NEGATIVE Final    Comment: (NOTE) SARS-CoV-2 target nucleic  acids are NOT DETECTED.  The SARS-CoV-2 RNA is generally detectable in upper respiratory specimens during the acute phase of infection. The lowest concentration of SARS-CoV-2 viral copies this assay can detect is 138 copies/mL. A negative result does not preclude SARS-Cov-2 infection and should not be used as the sole basis for treatment or other patient management decisions. A negative result may occur with  improper specimen collection/handling, submission of specimen other than nasopharyngeal swab, presence of viral mutation(s) within the areas targeted by this assay, and inadequate number of viral copies(<138 copies/mL). A negative result must be combined with clinical observations, patient history, and epidemiological information. The expected result is Negative.  Fact Sheet for Patients:  EntrepreneurPulse.com.au  Fact Sheet  for Healthcare Providers:  IncredibleEmployment.be  This test is no t yet approved or cleared by the [place] and  has been authorized for detection and/or diagnosis of SARS-CoV-2 by FDA under an Emergency Use Authorization (EUA). This EUA will remain  in effect (meaning this test can be used) for the duration of the COVID-19 declaration under Section 564(b)(1) of the Act, 21 U.S.C.section 360bbb-3(b)(1), unless the authorization is terminated  or revoked sooner.       Influenza A by PCR NEGATIVE NEGATIVE Final   Influenza B by PCR NEGATIVE NEGATIVE Final    Comment: (NOTE) The Xpert Xpress SARS-CoV-2/FLU/RSV plus assay is intended as an aid in the diagnosis of influenza from Nasopharyngeal swab specimens and should not be used as a sole basis for treatment. Nasal washings and aspirates are unacceptable for Xpert Xpress SARS-CoV-2/FLU/RSV testing.  Fact Sheet for Patients: EntrepreneurPulse.com.au  Fact Sheet for Healthcare Providers: IncredibleEmployment.be  This  test is not yet approved or cleared by the [place] FDA and has been authorized for detection and/or diagnosis of SARS-CoV-2 by FDA under an Emergency Use Authorization (EUA). This EUA will remain in effect (meaning this test can be used) for the duration of the COVID-19 declaration under Section 564(b)(1) of the Act, 21 U.S.C. section 360bbb-3(b)(1), unless the authorization is terminated or revoked.  Performed at [facility], [place] [address] [place], [place] [address]   MRSA Next Gen by PCR, Nasal     Status: None   Collection Time: 04/13/21  6:14 PM   Specimen: Nasal Mucosa; Nasal Swab  Result Value Ref Range Status   MRSA by PCR Next Gen NOT DETECTED NOT DETECTED Final    Comment: (NOTE) The GeneXpert MRSA Assay (FDA approved for NASAL specimens only), is one component of a comprehensive MRSA colonization surveillance program. It is not intended to diagnose MRSA infection nor to guide or monitor treatment for MRSA infections. Test performance is not FDA approved in patients less than 45 years old. Performed at [facility], [place] [address] [place], [place] [address]     Renal Function: Recent Labs    04/13/21 0800 04/14/21 0528  CREATININE 1.05 0.99   Estimated Creatinine Clearance: 112 mL/min (by C-G formula based on SCr of 0.99 mg/dL).  Radiologic Imaging: US SCROTUM  Result Date: 04/13/2021 CLINICAL DATA:  Hydrocele. EXAM: ULTRASOUND OF SCROTUM TECHNIQUE: Complete ultrasound examination of the testicles, epididymis, and other scrotal structures was performed. COMPARISON:  CT abdomen and pelvis 04/13/2021. Scrotal ultrasound 01/29/2021. FINDINGS: Right testicle Measurements: 4.2 x 2.4 x 2.7 cm. Again seen is an anechoic cyst within or adjacent to end deforming the right testicle measuring 2.1 x 2.0 x 2.1 cm, unchanged from prior. Vascular flow is identified in the right testicle. Left testicle Measurements: 3.6 x 2.2 x 2.2 cm. No  mass or microlithiasis visualized. Vascular flow is identified in the left testicle. Right epididymis:  Not well visualized. Left epididymis:  Not seen. Hydrocele: Complex multiloculated right hydrocele again noted. This measures 7.3 x 4.9 x 4.9 cm. Varicocele:  None visualized. Other: There is bilateral scrotal wall skin thickening and edema measuring 2.8 cm on the right and 6.0 cm on the left. IMPRESSION: 1. Complex multiloculated cystic structure in the right scrotum is again seen. Findings are worrisome for complex hydroceles. 2. Unchanged 2.1 cm cystic area adjacent to or within the right testicle. 3. Marked scrotal wall edema, left greater than right. Electronically Signed   By: Ronney Asters  M.D.   On: 04/13/2021 21:45   CT ABDOMEN PELVIS W CONTRAST  Result Date: 04/13/2021 CLINICAL DATA:  Abdominal pain, acute nonlocalized. Perineum pain/erythema. Evaluation for infection. EXAM: CT ABDOMEN AND PELVIS WITH CONTRAST TECHNIQUE: Multidetector CT imaging of the abdomen and pelvis was performed using the standard protocol following bolus administration of intravenous contrast. RADIATION DOSE REDUCTION: This exam was performed according to the departmental dose-optimization program which includes automated exposure control, adjustment of the mA and/or kV according to patient size and/or use of iterative reconstruction technique. CONTRAST:  173m OMNIPAQUE IOHEXOL 300 MG/ML  SOLN COMPARISON:  CT examination dated January 25, 2013 FINDINGS: Lower chest: Bibasilar subsegmental atelectasis. Heart is enlarged. Coronary artery atherosclerotic calcifications. Hepatobiliary: No focal liver abnormality is seen. Multiple gallstones. No gallbladder wall thickening or pericholecystic inflammatory changes. No biliary ductal dilatation. Pancreas: Moderate pancreatic atrophy. No pancreatic ductal dilatation or surrounding inflammatory changes. Spleen: Normal in size without focal abnormality. Adrenals/Urinary Tract: Adrenal  glands are unremarkable. Kidneys are normal, without renal calculi, focal lesion, or hydronephrosis. Bladder is unremarkable. Stomach/Bowel: Stomach is within normal limits. Appendix appears normal. No evidence of bowel wall thickening, distention, or inflammatory changes. Vascular/Lymphatic: Aortic atherosclerosis. No enlarged abdominal or pelvic lymph nodes. Reproductive: Prostate is unremarkable. Scrotal wall thickening with possible hydrocele. Other: There is subcutaneous soft tissue swelling prominent in the right groin/inguinal region. No drainable fluid collection or abscess. There are few mildly enlarged lymph node with fatty hilum, likely reactive. No abdominal wall hernia or abnormality. No abdominopelvic ascites. Musculoskeletal: Multilevel degenerative disc disease of the lumbar spine. No acute osseous abnormality. IMPRESSION: 1. Subcutaneous edema in the right inguinal region/perineum with mild skin thickening. No drainable fluid collection or abscess. Findings suggest early cellulitis/infectious/inflammatory process. Clinical correlation and continued follow-up is recommended. 2. Scrotal wall thickening with possible hydrocele. Scrotal sonogram could be obtained for further evaluation. 3.  Cholelithiasis without evidence of acute cholecystitis. 4.  Cardiomegaly with bibasilar atelectasis. Electronically Signed   By: IKeane PoliceD.O.   On: 04/13/2021 12:24   DG Chest Port 1 View  Result Date: 04/13/2021 CLINICAL DATA:  Shortness of breath EXAM: PORTABLE CHEST 1 VIEW COMPARISON:  01/15/2021 FINDINGS: Transverse diameter of heart is increased. There are no signs of pulmonary edema or focal pulmonary consolidation. There is poor inspiration. Left lateral CP angle is indistinct. This may be due to pleural thickening or minimal effusion. There is no pneumothorax. IMPRESSION: Cardiomegaly. There are no signs of pulmonary edema or focal pulmonary consolidation. Electronically Signed   By: PElmer PickerM.D.   On: 04/13/2021 09:20     Impression/Recommendation  Scrotal/groin edema--due to early cellulitis and fluid overload from CHF.  He also has right sided hydrocele which is benign and does not require intervention. Continue IV ABx and f/u cultures.  Lasix per IM. Elevate scrotum as much as possible. Continue Lotrimin cream to prevent/treat fungal infection.   ADebbrah Alar3/08/2021, 11:55 AM

## 2021-04-14 NOTE — Progress Notes (Signed)
?  Progress Note ? ? ?Patient: [name] MBT:[id number] DOB: [birth year] DOA: 04/13/2021     1 ?DOS: the patient was seen and examined on 04/14/2021 ?  ?Brief hospital course: ?71 y.o. male with medical history significant of pAfib, Hx of DVT, chronic diastolic HF, hypothyroidism, morbid obesity, chronic hypoxic respiratory failure on 4L [place], intermittent scrotal and groin swelling for several months requiring multiple ED visits presented with increased shortness of breath along with groin swelling and redness.  On presentation, BNP 228.1; chest x-ray showed no signs of pulmonary edema or focal pulmonary consolidation.  CT of abdomen/pelvis showed subcutaneous edema in the right inguinal region/perineum with mild skin thickening but no drainable fluid collection or abscess along with scrotal wall thickening with possible hydrocele. ? ?Assessment and Plan: ?Cellulitis of groin, right ?Scrotal cellulitis ?-CT imaging as above.  Scrotal ultrasound also showed mild scrotal edema along with possible complex hydroceles. ?-Continue broad-spectrum antibiotics. ?-I have consulted urology.  Follow recommendations. ? ?Chronic diastolic heart failure ([place]) ?- Strict input and output.  Daily weights.  Fluid restriction.  Continue metoprolol.  Switch Lasix to IV every 12 hours.  Monitor creatinine. ? ?Chronic respiratory failure with hypoxia ([place]) ?OSA ?-Normally uses 3 to 4 L oxygen via nasal cannula during daytime and CPAP at night.  Continue same. ? ?Hypothyroidism ?- Continue Synthroid ? ?Persistent atrial fibrillation ([place]) ?History of DVT ?-Continue metoprolol and Eliquis ? ?Morbid obesity with BMI of 60.0-69.9, adult ([place]) ?- Outpatient follow-up ? ? ? ? ?  ? ?Subjective:  ?Patient seen and examined at bedside.  Slightly short of breath with exertion.  Complains of groin pain and swelling.  No overnight chest pain, vomiting reported. ? ?Physical Exam: ?Vitals:  ? 04/14/21 0050 04/14/21 0135 04/14/21 0435 04/14/21 0500  ?BP:  97/69  95/73   ?Pulse: 90 93 72   ?Resp: '20 17 20   '$ ?Temp: 97.8 ?F (36.6 ?C)  98.1 ?F (36.7 ?C)   ?TempSrc: Oral  Oral   ?SpO2: 90% 94% 94%   ?Weight:    (!) 182.3 kg  ? ?General: No acute distress, currently on room air.  Looks chronically ill and deconditioned. ?ENT/neck: No elevated JVD.  No obvious masses  ?respiratory: Bilateral decreased breath sounds at bases with some scattered crackles ?CVS: S1-S2 heard, rate controlled ?Abdominal: Soft, morbidly obese, nontender, nondistended, no organomegaly, bowel sounds heard ?Extremities: No cyanosis, clubbing, edema ?Genitourinary: Scrotal swelling, tenderness and erythema present. ?CNS: Alert, awake and oriented.  No focal neurologic deficit.  Moving extremities. ?Lymph: No cervical lymphadenopathy ?Skin: No rashes, lesions, ulcers ?Psych: Mostly flat affect.  No signs of agitation.   ?Musculoskeletal: No obvious joint deformity/tenderness/swelling ? ?Data Reviewed: ?I have reviewed patient's investigations during this hospitalization myself.  Today sodium is 139, potassium is 4.3, creatinine of 0.99, WBC 4, hemoglobin 10.8, MCV 109.2, platelets of 225.  Blood cultures have been negative so far. ? ?Family Communication: None at bedside ? ?Disposition: ?Status is: Inpatient ?Remains inpatient appropriate because: Of need for IV antibiotics ? Planned Discharge Destination: Skilled nursing facility ? ? ? ? ?Author: ?Aline August, MD ?04/14/2021 12:43 PM ? ?For on call review [email or web address].  ?

## 2021-04-14 NOTE — Plan of Care (Signed)

## 2021-04-14 NOTE — Plan of Care (Signed)
Pt aox4, irritable with staff.  ?IV abx per orders.  ? ?Problem: Education: ?Goal: Knowledge of General Education information will improve ?Description: Including pain rating scale, medication(s)/side effects and non-pharmacologic comfort measures ?Outcome: Progressing ?  ?Problem: Health Behavior/Discharge Planning: ?Goal: Ability to manage health-related needs will improve ?Outcome: Progressing ?  ?Problem: Clinical Measurements: ?Goal: Ability to maintain clinical measurements within normal limits will improve ?Outcome: Progressing ?Goal: Will remain free from infection ?Outcome: Progressing ?Goal: Diagnostic test results will improve ?Outcome: Progressing ?Goal: Respiratory complications will improve ?Outcome: Progressing ?Goal: Cardiovascular complication will be avoided ?Outcome: Progressing ?  ?Problem: Activity: ?Goal: Risk for activity intolerance will decrease ?Outcome: Progressing ?  ?Problem: Nutrition: ?Goal: Adequate nutrition will be maintained ?Outcome: Progressing ?  ?Problem: Coping: ?Goal: Level of anxiety will decrease ?Outcome: Progressing ?  ?Problem: Elimination: ?Goal: Will not experience complications related to bowel motility ?Outcome: Progressing ?Goal: Will not experience complications related to urinary retention ?Outcome: Progressing ?  ?Problem: Pain Managment: ?Goal: General experience of comfort will improve ?Outcome: Progressing ?  ?Problem: Safety: ?Goal: Ability to remain free from injury will improve ?Outcome: Progressing ?  ?Problem: Skin Integrity: ?Goal: Risk for impaired skin integrity will decrease ?Outcome: Progressing ?  ?

## 2021-04-14 NOTE — Progress Notes (Signed)
Pharmacy Antibiotic Note ? ?[name] is a 71 y.o. male admitted on 04/13/2021 with cellulitis.  Pharmacy has been consulted for vanc/cefepime dosing. ? ?Plan: ?Continue Cefepime 2g IV q8h ?Change to Vancomycin 1250 mg IV q12h (SCr 1, eAUC 460) ?Measure Vanc levels as needed.  Goal AUC = 400 - 550. ?Follow up renal function, culture results, and clinical course. ? ? ?Weight: (!) 182.3 kg (402 lb) ? ?Temp (24hrs), Avg:98.2 ?F (36.8 ?C), Min:97.8 ?F (36.6 ?C), Max:98.7 ?F (37.1 ?C) ? ?Recent Labs  ?Lab 04/13/21 ?0800 04/13/21 ?0902 04/13/21 ?1215 04/14/21 ?8527  ?WBC 3.6*  --   --  4.0  ?CREATININE 1.05  --   --  0.99  ?LATICACIDVEN  --  1.3 0.9  --   ? ?  ?Estimated Creatinine Clearance: 112 mL/min (by C-G formula based on SCr of 0.99 mg/dL).   ? ?Allergies  ?Allergen Reactions  ? Morphine Hives  ? ?Antimicrobials this admission:  ?3/6 Cefepime >> ?3/6 Vancomycin >>  ? ?Dose adjustments this admission:  ? ? ?Microbiology results:  ?3/6 Covid: neg; Influenza neg ?3/6 BCx:  ?3/6 MRSA PCR: not detected ?3/6 UCx: < 10k colonies (insignificant growth) ? ?Thank you for allowing pharmacy to be a part of this patient?s care. ? ?Gretta Arab PharmD, BCPS ?Clinical Pharmacist ?[facility] [phone] ?04/14/2021 11:40 AM ? ? ?

## 2021-04-14 NOTE — Progress Notes (Signed)
Called to patient room for desaturations. Patient now agreeable to CPAP. Place on auto titration. See flowsheet.  ?

## 2021-04-14 NOTE — Plan of Care (Signed)

## 2021-04-14 NOTE — Assessment & Plan Note (Signed)
Continue Synthroid °

## 2021-04-14 NOTE — Assessment & Plan Note (Addendum)
Scrotal cellulitis -CT imaging as above.  Scrotal ultrasound also showed mild scrotal edema along with possible complex hydroceles. -Currently on broad-spectrum antibiotics.  Switch antibiotics to Augmentin and doxycycline. -Urology recommended conservative management with antibiotics; scrotal elevation and to continue Lotrimin cream to prevent/treat fungal infection.

## 2021-04-14 NOTE — Assessment & Plan Note (Addendum)
-  Body mass index is 70.53 kg/m.  -  Most recent weight is 210 kg

## 2021-04-14 NOTE — Evaluation (Signed)
Physical Therapy Evaluation ?Patient Details ?Name: [name] ?MRN: [id number] ?DOB: [birth year] ?Today's Date: 04/14/2021 ? ?History of Present Illness ? [name] is a 71 y.o. male presents with increased edema to his perineum and lower abdomen. CT of abdomen/pelvis showed subcutaneous edema in the right inguinal region/perineum with mild skin thickening but no drainable fluid collection or abscess along with scrotal wall thickening with possible hydrocele. 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  ?Clinical Impression ? Pt admitted with above diagnosis. Per chart review, pt requiring +2 max A with bed mobility in Dec 2022 during hospitalization. Pt difficult to follow regarding current functional status, providing varying stories. Pt currently requires assist to grab bedrail to begin rotating trunk, but unable to achieve full sidelying without total A. Per nursing, pt requiring +4 assist to fully roll in bed today. RLE with good ankle and knee AROM, hip weak and resting in external rotation; L AKA noted with fair hip abd/add strength. Recommending SNF vs LTC pending baseline, pt will need 24/7 assist at discharge. Pt currently with functional limitations due to the deficits listed below (see PT Problem List). Pt will benefit from skilled PT to increase their independence and safety with mobility to allow discharge to the venue listed below.   ?   ?   ? ?Recommendations for follow up therapy are one component of a multi-disciplinary discharge planning process, led by the attending physician.  Recommendations may be updated based on patient status, additional functional criteria and insurance authorization. ? ?Follow Up Recommendations Skilled nursing-short term rehab (<3 hours/day) (vs LTC) ? ?  ?Assistance Recommended at Discharge Frequent or constant Supervision/Assistance  ?Patient can return home with the following ? Two people to help with walking and/or transfers;Two  people to help with bathing/dressing/bathroom;Assistance with cooking/housework ? ?  ?Equipment Recommendations None recommended by PT  ?Recommendations for Other Services ?    ?  ?Functional Status Assessment    ? ?  ?Precautions / Restrictions Precautions ?Precautions: Fall ?Precaution Comments: scrotal/groin edema ?Restrictions ?Weight Bearing Restrictions: No  ? ?  ? ?Mobility ? Bed Mobility ?Overal bed mobility: Needs Assistance ?Bed Mobility: Rolling ?Rolling: Total assist ? General bed mobility comments: pt requires assist to reach across body to grab bedrail, once holding bedrail assists in rotating trunk R/L minimally, requires total A to fully roll into R and L sidelying ?  ? ?Transfers ? General transfer comment: NT ?  ? ?Ambulation/Gait ? General Gait Details: NT ? ?Stairs ?  ?  ?  ?  ?  ? ?Wheelchair Mobility ?  ? ?Modified Rankin (Stroke Patients Only) ?  ? ?  ? ?Balance   ?   ? ? ? ?Pertinent Vitals/Pain Pain Assessment ?Pain Assessment: No/denies pain  ? ? ?Home Living Family/patient expects to be discharged to:: Skilled nursing facility ?   ?Prior Function Prior Level of Function : Patient poor historian/Family not available ? Mobility Comments: Pt difficult to follow, initially states using RW to transfer and ind with mobility, nonambulatory, then states he hasn't been out of bed since October ?ADLs Comments: pt difficult to follow, initially states independent, then states facility assists him ?  ? ? ?Hand Dominance  ?   ? ?  ?Extremity/Trunk Assessment  ? Upper Extremity Assessment ?Upper Extremity Assessment: Generalized weakness ?  ? ?Lower Extremity Assessment ?Lower Extremity Assessment: Generalized weakness;RLE deficits/detail;LLE deficits/detail ?RLE Deficits / Details: ankle and knee AROM WNL, good quad set, unable to perform  SLR, hip abd/add 2-/5, RLE rests in hip external rotation and plantar flexion ?LLE Deficits / Details: AKA, hip abd/add 2+/5 ?  ? ?   ?Communication  ? Communication:  Other (comment) (mumbled speech)  ?Cognition Arousal/Alertness: Awake/alert ?Behavior During Therapy: [facility] for tasks assessed/performed ?Overall Cognitive Status: No family/caregiver present to determine baseline cognitive functioning ? General Comments: Pt oriented to self and being in hospital, doesn't verbalize date. Pt reporting varying functional levels, difficult to follow ?  ?  ? ?  ?General Comments   ? ?  ?Exercises    ? ?Assessment/Plan  ?  ?PT Assessment Patient needs continued PT services  ?PT Problem List Decreased strength;Decreased range of motion;Decreased activity tolerance;Decreased balance;Decreased mobility;Decreased knowledge of use of DME;Decreased safety awareness;Obesity ? ?   ?  ?PT Treatment Interventions DME instruction;Functional mobility training;Therapeutic activities;Therapeutic exercise;Balance training;Patient/family education   ? ?PT Goals (Current goals can be found in the Care Plan section)  ?Acute Rehab PT Goals ?Patient Stated Goal: agreeable to PT eval ?PT Goal Formulation: With patient ?Time For Goal Achievement: 04/28/21 ?Potential to Achieve Goals: Fair ? ?  ?Frequency Min 2X/week ?  ? ? ?Co-evaluation   ?  ?  ?  ?  ? ? ?  ?AM-PAC PT "6 Clicks" Mobility  ?Outcome Measure Help needed turning from your back to your side while in a flat bed without using bedrails?: Total ?Help needed moving from lying on your back to sitting on the side of a flat bed without using bedrails?: Total ?Help needed moving to and from a bed to a chair (including a wheelchair)?: Total ?Help needed standing up from a chair using your arms (e.g., wheelchair or bedside chair)?: Total ?Help needed to walk in hospital room?: Total ?Help needed climbing 3-5 steps with a railing? : Total ?6 Click Score: 6 ? ?  ?End of Session Equipment Utilized During Treatment: Oxygen ?Activity Tolerance: Patient tolerated treatment well ?Patient left: in bed;with call bell/phone within reach ?Nurse Communication: Mobility  status ?PT Visit Diagnosis: Other abnormalities of gait and mobility (R26.89);Muscle weakness (generalized) (M62.81) ?  ? ?Time: 0175-1025 ?PT Time Calculation (min) (ACUTE ONLY): 11 min ? ? ?Charges:   PT Evaluation ?$PT Eval Moderate Complexity: 1 Mod ?  ?  ?   ? ? ? ?Talbot Grumbling PT, DPT ?04/14/21, 4:06 PM ? ? ? ?

## 2021-04-15 ENCOUNTER — Inpatient Hospital Stay (HOSPITAL_COMMUNITY): Payer: Medicare Other

## 2021-04-15 DIAGNOSIS — J9611 Chronic respiratory failure with hypoxia: Secondary | ICD-10-CM | POA: Diagnosis not present

## 2021-04-15 DIAGNOSIS — I5043 Acute on chronic combined systolic (congestive) and diastolic (congestive) heart failure: Secondary | ICD-10-CM

## 2021-04-15 DIAGNOSIS — D638 Anemia in other chronic diseases classified elsewhere: Secondary | ICD-10-CM

## 2021-04-15 DIAGNOSIS — L03314 Cellulitis of groin: Secondary | ICD-10-CM | POA: Diagnosis not present

## 2021-04-15 DIAGNOSIS — D7589 Other specified diseases of blood and blood-forming organs: Secondary | ICD-10-CM

## 2021-04-15 DIAGNOSIS — G4733 Obstructive sleep apnea (adult) (pediatric): Secondary | ICD-10-CM

## 2021-04-15 DIAGNOSIS — I5032 Chronic diastolic (congestive) heart failure: Secondary | ICD-10-CM | POA: Diagnosis not present

## 2021-04-15 LAB — CBC WITH DIFFERENTIAL/PLATELET
Abs Immature Granulocytes: 0.01 10*3/uL (ref 0.00–0.07)
Basophils Absolute: 0 10*3/uL (ref 0.0–0.1)
Basophils Relative: 1 %
Eosinophils Absolute: 0.1 10*3/uL (ref 0.0–0.5)
Eosinophils Relative: 3 %
HCT: 35.5 % — ABNORMAL LOW (ref 39.0–52.0)
Hemoglobin: 10 g/dL — ABNORMAL LOW (ref 13.0–17.0)
Immature Granulocytes: 0 %
Lymphocytes Relative: 19 %
Lymphs Abs: 0.7 10*3/uL (ref 0.7–4.0)
MCH: 30.8 pg (ref 26.0–34.0)
MCHC: 28.2 g/dL — ABNORMAL LOW (ref 30.0–36.0)
MCV: 109.2 fL — ABNORMAL HIGH (ref 80.0–100.0)
Monocytes Absolute: 0.6 10*3/uL (ref 0.1–1.0)
Monocytes Relative: 16 %
Neutro Abs: 2.3 10*3/uL (ref 1.7–7.7)
Neutrophils Relative %: 61 %
Platelets: 191 10*3/uL (ref 150–400)
RBC: 3.25 MIL/uL — ABNORMAL LOW (ref 4.22–5.81)
RDW: 17.6 % — ABNORMAL HIGH (ref 11.5–15.5)
WBC: 3.8 10*3/uL — ABNORMAL LOW (ref 4.0–10.5)
nRBC: 0 % (ref 0.0–0.2)

## 2021-04-15 LAB — BASIC METABOLIC PANEL
Anion gap: 6 (ref 5–15)
BUN: 12 mg/dL (ref 8–23)
CO2: 37 mmol/L — ABNORMAL HIGH (ref 22–32)
Calcium: 9.1 mg/dL (ref 8.9–10.3)
Chloride: 96 mmol/L — ABNORMAL LOW (ref 98–111)
Creatinine, Ser: 0.96 mg/dL (ref 0.61–1.24)
GFR, Estimated: >60 mL/min (ref >60)
Glucose, Bld: 114 mg/dL — ABNORMAL HIGH (ref 70–99)
Potassium: 3.9 mmol/L (ref 3.5–5.1)
Sodium: 139 mmol/L (ref 135–145)

## 2021-04-15 LAB — ECHOCARDIOGRAM COMPLETE
AR max vel: 5.51 cm2
AV Area VTI: 5.78 cm2
AV Area mean vel: 5.21 cm2
AV Mean grad: 2.7 mmHg
AV Peak grad: 4.9 mmHg
Ao pk vel: 1.11 m/s
Calc EF: 47 %
MV M vel: 3.47 m/s
MV Peak grad: 48.2 mmHg
S' Lateral: 3.2 cm
Single Plane A2C EF: 58.1 %
Single Plane A4C EF: 34.1 %
Weight: 6432 oz

## 2021-04-15 LAB — MAGNESIUM: Magnesium: 2.1 mg/dL (ref 1.7–2.4)

## 2021-04-15 LAB — C-REACTIVE PROTEIN: CRP: 1.8 mg/dL — ABNORMAL HIGH (ref <1.0)

## 2021-04-15 MED ORDER — ENSURE MAX PROTEIN PO LIQD
11.0000 [oz_av] | Freq: Every day | ORAL | Status: DC
  Administered 2021-04-15 – 2021-04-20 (×5): 11 [oz_av] via ORAL
  Filled 2021-04-15 (×7): qty 330

## 2021-04-15 MED ORDER — PROSOURCE PLUS PO LIQD
30.0000 mL | Freq: Two times a day (BID) | ORAL | Status: DC
  Administered 2021-04-15 – 2021-04-21 (×8): 30 mL via ORAL
  Filled 2021-04-15 (×5): qty 30

## 2021-04-15 MED ORDER — JUVEN PO PACK
1.0000 | PACK | Freq: Two times a day (BID) | ORAL | Status: DC
  Administered 2021-04-15 – 2021-05-07 (×37): 1 via ORAL
  Filled 2021-04-15 (×44): qty 1

## 2021-04-15 NOTE — Plan of Care (Signed)

## 2021-04-15 NOTE — Assessment & Plan Note (Addendum)
Macrocytosis ?-Possibly from chronic illnesses.  Hemoglobin stable.  Monitor. ?

## 2021-04-15 NOTE — TOC Initial Note (Signed)
Transition of Care (TOC) - Initial/Assessment Note  ? ? ?Patient Details  ?Name: [name] ?MRN: [id number] ?Date of Birth: December 26, 1950 ? ?Transition of Care (TOC) CM/SW Contact:    ?Trish Mage, LCSW ?Phone Number: ?04/15/2021, 2:46 PM ? ?Clinical Narrative:   Patient who is a resident of [facility] seen in follow up to PT recommendation of SNF.  He declined, saying he wants to return "home."  I called Ms Royden Purl, med tech at [facility] .  She states that at baseline patient has independent bed mobility and is able to  transfer to a chair on his own, and he needs to be able to do that prior to returning.  I informed patient.  He absorbed the blow.  Bed search initiated. TOC will continue to follow during the course of hospitalization. ?           ? ? ?Expected Discharge Plan: [place] ?Barriers to Discharge: SNF Pending bed offer ? ? ?Patient Goals and CMS Choice ?Patient states their goals for this hospitalization and ongoing recovery are:: Go back to [facility] ?CMS Medicare.gov Compare Post Acute Care list provided to:: Patient ?Choice offered to / list presented to : Patient ? ?Expected Discharge Plan and Services ?Expected Discharge Plan: [place] ?  ?Discharge Planning Services: CM Consult ?Post Acute Care Choice: [place] ?Living arrangements for the past 2 months: [place] ?                ?  ?  ?  ?  ?  ?  ?  ?  ?  ?  ? ?Prior Living Arrangements/Services ?Living arrangements for the past 2 months: [place] ?Lives with:: Facility Resident ?Patient language and need for interpreter reviewed:: Yes ?       ?Need for Family Participation in Patient Care: Yes (Comment) ?Care giver support system in place?: Yes (comment) ?  ?Criminal Activity/Legal Involvement Pertinent to Current Situation/Hospitalization: No - Comment as needed ? ?Activities of Daily Living ?Home Assistive Devices/Equipment: Other (Comment)  (from snf) ?ADL Screening (condition at time of admission) ?Patient's cognitive ability adequate to safely complete daily activities?: Yes ?Is the patient deaf or have difficulty hearing?: No ?Does the patient have difficulty seeing, even when wearing glasses/contacts?: No ?Does the patient have difficulty concentrating, remembering, or making decisions?: Yes ?Patient able to express need for assistance with ADLs?: Yes ?Does the patient have difficulty dressing or bathing?: Yes ?Independently performs ADLs?: No ?Communication: Independent ?Dressing (OT): Dependent ?Grooming: Dependent ?Bathing: Dependent ?Toileting: Dependent ?In/Out Bed: Dependent ?Does the patient have difficulty walking or climbing stairs?: Yes ?Weakness of Legs: Right ?Weakness of Arms/Hands: Both ? ?Permission Sought/Granted ?  ?  ?   ?   ?   ?   ? ?Emotional Assessment ?Appearance:: Appears stated age ?Attitude/Demeanor/Rapport: Engaged ?Affect (typically observed): Appropriate ?Orientation: : Oriented to Self, Oriented to Place, Oriented to Situation ?Alcohol / Substance Use: Not Applicable ?Psych Involvement: No (comment) ? ?Admission diagnosis:  Lower extremity cellulitis [L03.119] ?Edema, unspecified type [R60.9] ?Cellulitis, unspecified cellulitis site [L03.90] ?Patient Active Problem List  ? Diagnosis Date Noted  ? Anemia of chronic disease 04/15/2021  ? Macrocytosis 04/15/2021  ? Cellulitis of groin, right 04/14/2021  ? Chronic respiratory failure with hypoxia ([place]) 04/14/2021  ? Pressure injury of skin 04/14/2021  ? Acute respiratory failure with hypoxia ([place]) 01/19/2021  ? Hypothyroidism   ? Macrocytic anemia   ? Morbid obesity with BMI  of 60.0-69.9, adult [facility])   ? Pneumonia due to COVID-19 virus 01/15/2021  ? Cellulitis of left leg   ? 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[place])   ? 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 Septic joint of left knee joint ([place]) 11/06/2019  ? Infection of total knee replacement  ([place]) 08/15/2019  ? Open knee wound 08/14/2019  ? S/P left TK revision 04/03/2019  ? Administration of long-term prophylactic antibiotics   ? History of streptococcal infection   ? History of DVT (deep vein thrombosis)   ? S/P rev left TK 12/15/2015  ? 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  ? Preoperative cardiovascular examination 04/17/2013  ? OSA (obstructive sleep apnea) 11/27/2012  ? Chronic diastolic heart failure ([place]) 09/10/2012  ? Chronic anticoagulation, with Xarelto 09/10/2012  ? DVT (deep venous thrombosis), possible 09/10/2012  ? SOB (shortness of breath) 08/26/2012  ? Chest discomfort 08/26/2012  ? Persistent atrial fibrillation ([place]) 08/26/2012  ? Super obesity 06/27/2012  ? Expected blood loss anemia 06/27/2012  ? S/P left TK revision 06/23/2012  ? Septic arthritis of knee ([place])   ? Cellulitis 09/19/2010  ? METHICILLIN RESISTANT STAPHYLOCOCCUS AUREUS INFECTION 09/10/2009  ? STREPTOCOCCUS INFECTION CCE & UNS SITE GROUP C 07/04/2008  ? DM 07/04/2008  ? CHRONIC KIDNEY DISEASE UNSPECIFIED 07/04/2008  ? PULMONARY EMBOLISM, HX OF 07/04/2008  ? INFECTION DUE TO INTERNAL ORTH DEVICE NEC 03/02/2006  ? ?PCP:  Bernerd Limbo, MD ?Pharmacy:   ?[address] [place] ?[place] ?[address] ?[place] [address] ?Phone: [phone] Fax: [phone] ? ? ? ? ?Social Determinants of Health (SDOH) Interventions ?  ? ?Readmission Risk Interventions ?Readmission Risk Prevention Plan 11/09/2019  ?Transportation Screening Complete  ?PCP or Specialist Appt within 5-7 Days Complete  ?Home Care Screening Complete  ?Medication Review (RN CM) Complete  ?Some recent data might be hidden  ? ? ? ?

## 2021-04-15 NOTE — Progress Notes (Signed)
Initial Nutrition Assessment ? ?DOCUMENTATION CODES:  ? ?Morbid obesity ? ?INTERVENTION:  ? ?-Ensure MAX Protein po daily, each supplement provides 150 kcal and 30 grams of protein  ? ?-1 packet Juven BID, each packet provides 95 calories, 2.5 grams of protein (collagen), and 9.8 grams of carbohydrate (3 grams sugar); also contains 7 grams of L-arginine and L-glutamine, 300 mg vitamin C, 15 mg vitamin E, 1.2 mcg vitamin B-12, 9.5 mg zinc, 200 mg calcium, and 1.5 g  Calcium Beta-hydroxy-Beta-methylbutyrate to support wound healing  ? ?-Prosource Plus PO BID, each provides 100 kcals and 15g protein ? ?NUTRITION DIAGNOSIS:  ? ?Increased nutrient needs related to wound healing as evidenced by estimated needs. ? ?GOAL:  ? ?Patient will meet greater than or equal to 90% of their needs ? ? ?MONITOR:  ? ?PO intake, Supplement acceptance, Labs, Weight trends, I & O's, Skin ? ?REASON FOR ASSESSMENT:  ? ? (Wounds) ?  ? ?ASSESSMENT:  ? ?71 y.o. male with medical history significant of pAfib, Hx of DVT, chronic diastolic HF, hypothyroidism, morbid obesity, chronic hypoxic respiratory failure on 4L [place], intermittent scrotal and groin swelling for several months requiring multiple ED visits presented with increased shortness of breath along with groin swelling and redness. ? ?Patient in room, hard to understand. Unable to provide any history, answered some questions.  ?Pt reports eating well this morning, documented as consuming 100% of breakfast. Would not elaborate on what he ate. Pt reports no issues with eating at home. Was unable to gather diet recall.  ? ?Given stage 2 pressure injury, will order Juven, Ensure Max and Prosource Plus to aid in wound healing.  ? ?Per weight records, pt has lost 37 lbs since 01/17/21 (8% wt loss x 3 months, significant for time frame). Pt with history of CHF, may be d/t fluid shifts. ? ?Medications: Vitamin C, Lasix, Multivitamin with minerals daily, Vitamin B-12 ? ?Labs reviewed: ?   ? ?NUTRITION - FOCUSED PHYSICAL EXAM: ? ?No depletions noted. ? ?Diet Order:   ?Diet Order   ? ?       ?  Diet Heart Room service appropriate? Yes; Fluid consistency: Thin; Fluid restriction: 1500 mL Fluid  Diet effective now       ?  ? ?  ?  ? ?  ? ? ?EDUCATION NEEDS:  ? ?Not appropriate for education at this time ? ?Skin:  Skin Assessment: Skin Integrity Issues: ?Skin Integrity Issues:: Stage II ?Stage II: right thigh ? ?Last BM:  PTA ? ?Height:  ? ?Ht Readings from Last 1 Encounters:  ?03/19/21 '5\' 8"'$  (1.727 m)  ? ? ?Weight:  ? ?Wt Readings from Last 1 Encounters:  ?04/15/21 (!) 182.3 kg  ? ? ?BMI:  Body mass index is 67.4 kg/m?. -adjusted for left AKA ? ?Estimated Nutritional Needs:  ? ?Kcal:  2100-2300 ? ?Protein:  115-130g ? ?Fluid:  2L/day ? ? ?[name] Bibles, MS, RD, LDN ?Inpatient Clinical Dietitian ?Contact information available via Amion ? ?

## 2021-04-15 NOTE — Progress Notes (Signed)
?Progress Note ? ? ?Patient: [name] UEA:[id number] DOB: 1950/03/29 DOA: 04/13/2021     2 ?DOS: the patient was seen and examined on 04/15/2021 ?  ?Brief hospital course: ?71 y.o. male with medical history significant of pAfib, Hx of DVT, chronic diastolic HF, hypothyroidism, morbid obesity, chronic hypoxic respiratory failure on 4L [place], intermittent scrotal and groin swelling for several months requiring multiple ED visits presented with increased shortness of breath along with groin swelling and redness.  On presentation, BNP 228.1; chest x-ray showed no signs of pulmonary edema or focal pulmonary consolidation.  CT of abdomen/pelvis showed subcutaneous edema in the right inguinal region/perineum with mild skin thickening but no drainable fluid collection or abscess along with scrotal wall thickening with possible hydrocele.  Urology was consulted. ? ?Assessment and Plan: ?Cellulitis of groin, right ?Scrotal cellulitis ?-CT imaging as above.  Scrotal ultrasound also showed mild scrotal edema along with possible complex hydroceles. ?-Continue broad-spectrum antibiotics. ?-Urology following and recommending conservative management with antibiotics; scrotal elevation and to continue Lotrimin cream to prevent/treat fungal infection. ? ?Chronic diastolic heart failure ([place]) ?- Strict input and output.  Daily weights.  Fluid restriction.  Negative balance of 5592.9 cc since admission.  Continue metoprolol.  Continue IV Lasix.  Monitor creatinine. ?-Follow 2D echo ? ?Chronic respiratory failure with hypoxia (HCC) ?OSA ?-Normally uses 3 to 4 L oxygen via nasal cannula during daytime and CPAP at night.  Continue same. ? ?Hypothyroidism ?- Continue Synthroid ? ?Persistent atrial fibrillation ([place]) ?History of DVT ?-Continue metoprolol and Eliquis ? ?Morbid obesity with BMI of 60.0-69.9, adult ([place]) ?- Outpatient follow-up ? ?Anemia of chronic disease ?Macrocytosis ?- Positive from chronic illnesses.  Hemoglobin stable.   Monitor. ? ?Stage II pressure injury right posterior thigh: Present on admission ?-Continue local wound care ? ? ?Subjective:  ?Patient seen and examined at bedside.  Feels slightly better but still complains of scrotal swelling and intermittent pain.  No overnight fever or vomiting or chest pain reported. ? ?Physical Exam: ?Vitals:  ? 04/14/21 2016 04/14/21 2205 04/15/21 4782 04/15/21 0428  ?BP:  119/68 107/64   ?Pulse: (!) 102 81 99   ?Resp: 20  16   ?Temp:   99.2 ?F (37.3 ?C)   ?TempSrc:   Oral   ?SpO2:   95%   ?Weight:    (!) 182.3 kg  ? ?General: On 3.5 L oxygen via nasal cannula.  No distress.  Looks chronically ill and deconditioned. ?ENT/neck: No thyromegaly.  JVD is not elevated  ?respiratory: Decreased breath sounds at bases bilaterally with some crackles; no wheezing CVS: S1-S2 heard, rate controlled ?Abdominal: Soft, morbidly obese, nontender, slightly distended; no organomegaly, normal bowel sounds are heard ?Extremities: Left AKA present.  Right lower extremity edema present; no clubbing  ?CNS: Awake and alert.  No focal neurologic deficit.  Moves extremities ?Lymph: No obvious lymphadenopathy ?Skin: No obvious ecchymosis/lesions  ?psych: Flat affect.  Currently not agitated.  ? Musculoskeletal: No obvious joint swelling/deformity ?Genitourinary: Scrotal swelling, tenderness and mild erythema present ? ?Data Reviewed: ?I have reviewed patient's investigations during this hospitalization myself.  Today, sodium is 139, potassium of 3.9, creatinine 0.96, magnesium 2.1, WBC 3.8, hemoglobin 10, platelets 191 ? ?Family Communication: None at bedside ?  ?Disposition: ?Status is: Inpatient ?Remains inpatient appropriate because: Of need for IV antibiotics ? Planned Discharge Destination: Skilled nursing facility in 1 to 3 days pending clinical improvement ?  ? ? ? ? ?Author: ?Aline August, MD ?04/15/2021 8:13 AM ? ?For on  call review [email or web address].  ?

## 2021-04-15 NOTE — Progress Notes (Signed)
Pt's appetite decrease as he slept through lunch.  ? ?Awake for dinner and pt was dependent with meal. Ate all his mashed potatoes and roasted [place]. Refused salad, pineapples, and green beans on plate.  ? ?No swallowing difficulties noted. ?

## 2021-04-16 ENCOUNTER — Encounter (HOSPITAL_COMMUNITY): Payer: Self-pay | Admitting: Internal Medicine

## 2021-04-16 DIAGNOSIS — I5033 Acute on chronic diastolic (congestive) heart failure: Secondary | ICD-10-CM

## 2021-04-16 DIAGNOSIS — L03314 Cellulitis of groin: Secondary | ICD-10-CM | POA: Diagnosis not present

## 2021-04-16 DIAGNOSIS — J9611 Chronic respiratory failure with hypoxia: Secondary | ICD-10-CM | POA: Diagnosis not present

## 2021-04-16 DIAGNOSIS — D638 Anemia in other chronic diseases classified elsewhere: Secondary | ICD-10-CM | POA: Diagnosis not present

## 2021-04-16 DIAGNOSIS — I4819 Other persistent atrial fibrillation: Secondary | ICD-10-CM | POA: Diagnosis not present

## 2021-04-16 LAB — BASIC METABOLIC PANEL
Anion gap: 5 (ref 5–15)
BUN: 19 mg/dL (ref 8–23)
CO2: 41 mmol/L — ABNORMAL HIGH (ref 22–32)
Calcium: 9.5 mg/dL (ref 8.9–10.3)
Chloride: 94 mmol/L — ABNORMAL LOW (ref 98–111)
Creatinine, Ser: 1.06 mg/dL (ref 0.61–1.24)
GFR, Estimated: >60 mL/min (ref >60)
Glucose, Bld: 111 mg/dL — ABNORMAL HIGH (ref 70–99)
Potassium: 4.1 mmol/L (ref 3.5–5.1)
Sodium: 140 mmol/L (ref 135–145)

## 2021-04-16 LAB — MAGNESIUM: Magnesium: 2.3 mg/dL (ref 1.7–2.4)

## 2021-04-16 MED ORDER — METOPROLOL SUCCINATE ER 50 MG PO TB24
50.0000 mg | ORAL_TABLET | Freq: Every day | ORAL | Status: DC
  Administered 2021-04-16 – 2021-04-17 (×2): 50 mg via ORAL
  Filled 2021-04-16 (×2): qty 1

## 2021-04-16 NOTE — Progress Notes (Signed)
?Progress Note ? ? ?Patient: [name] QBH:[id number] DOB: 08/03/1950 DOA: 04/13/2021     3 ?DOS: the patient was seen and examined on 04/16/2021 ?  ?Brief hospital course: ?71 y.o. male with medical history significant of pAfib, Hx of DVT, chronic diastolic HF, hypothyroidism, morbid obesity, chronic hypoxic respiratory failure on 4L [place], intermittent scrotal and groin swelling for several months requiring multiple ED visits presented with increased shortness of breath along with groin swelling and redness.  On presentation, BNP 228.1; chest x-ray showed no signs of pulmonary edema or focal pulmonary consolidation.  CT of abdomen/pelvis showed subcutaneous edema in the right inguinal region/perineum with mild skin thickening but no drainable fluid collection or abscess along with scrotal wall thickening with possible hydrocele.  Urology was consulted. ? ?Assessment and Plan: ?Cellulitis of groin, right ?Scrotal cellulitis ?-CT imaging as above.  Scrotal ultrasound also showed mild scrotal edema along with possible complex hydroceles. ?-Continue broad-spectrum antibiotics. ?-Urology following and recommending conservative management with antibiotics; scrotal elevation and to continue Lotrimin cream to prevent/treat fungal infection. ? ?Chronic respiratory failure with hypoxia ([place]) ?OSA ?-Normally uses 3 to 4 L oxygen via nasal cannula during daytime and CPAP at night.  Continue same. ? ?Hypothyroidism ?- Continue Synthroid ? ?Persistent atrial fibrillation ([place]) ?History of DVT ?-Continue metoprolol and Eliquis ? ?Morbid obesity with BMI of 60.0-69.9, adult ([place]) ?- Outpatient follow-up ? ?Acute on chronic diastolic CHF (congestive heart failure) ([place]) ?- Strict input and output.  Daily weights.  Fluid restriction.  Negative balance of 6802.9 cc since admission.  Continue metoprolol.  Continue IV Lasix.  Monitor creatinine. ?-Echo showed EF of 45 to 50% with global hypokinesis.  Consult cardiology ? ? ?Anemia of  chronic disease ?Macrocytosis ?- Positive from chronic illnesses.  Hemoglobin stable.  Monitor. ? ?Pressure injury of skin ?Stage II pressure injury right posterior thigh: Present on admission ?-Continue local wound care ? ? ?Subjective:  ?Patient seen and examined at bedside.  Denies worsening chest pain, nausea or vomiting.  Still complains of some scrotal swelling and intermittent pain. ? ?Physical Exam: ?Vitals:  ? 04/15/21 1409 04/15/21 2003 04/15/21 2014 04/16/21 0402  ?BP: (!) 108/47  (!) 107/58 106/69  ?Pulse: 64 99 93 (!) 105  ?Resp: (!) 23 18 (!) 24 20  ?Temp: 98.6 ?F (37 ?C)  100.3 ?F (37.9 ?C) 98.7 ?F (37.1 ?C)  ?TempSrc: Oral     ?SpO2: 91%  92% 97%  ?Weight:    (!) 183.7 kg  ? ?General: No acute distress, currently on 3.5 L oxygen by nasal cannula.  Looks chronically ill and deconditioned. ?ENT/neck: No elevated JVD.  No obvious masses  ?respiratory: Bilateral decreased breath sounds at bases with some scattered crackles; intermittently tachypneic ?CVS: Intermittently tachycardic; S1-S2 heard  ?abdominal: Soft, morbidly obese, nontender, nondistended, no organomegaly, bowel sounds heard ?Extremities: No cyanosis, clubbing; mild right lower extremity edema present.  Left AKA present  ?Genitourinary: Mild scrotal swelling, tenderness present.  No discharge or wounds ?CNS: Awake; slow to respond.  No focal neurologic deficit.  Moving extremities. ?Lymph: No cervical lymphadenopathy ?Skin: No rashes, lesions, ulcers ?Psych: No agitation.  Affect is flat  ?musculoskeletal: No obvious other joint deformity/tenderness/swelling ? ?Data Reviewed: ?I have reviewed patient's investigations during this hospitalization myself.  Today, sodium 140, potassium 4.1, creatinine 1.06 ? ?Family Communication: None at bedside ?  ?Disposition: ?Status is: Inpatient ?Remains inpatient appropriate because: Of need for IV antibiotics ? Planned Discharge Destination: Skilled nursing facility in 1 to 2  days once clinically  improved.  Social worker following regarding the same  ? ? ? ?Author: ?Aline August, MD ?04/16/2021 7:55 AM ? ?For on call review [email or web address].  ?

## 2021-04-16 NOTE — Plan of Care (Signed)
Pt aox4, cooperative with staff. Bed bound at this time, max/dependent assist.  ?IV antibiotics, lasix per orders.  ?Urology consulted.  ?PT/OT following.  ? ?Problem: Education: ?Goal: Knowledge of General Education information will improve ?Description: Including pain rating scale, medication(s)/side effects and non-pharmacologic comfort measures ?Outcome: Progressing ?  ?Problem: Health Behavior/Discharge Planning: ?Goal: Ability to manage health-related needs will improve ?Outcome: Progressing ?  ?Problem: Clinical Measurements: ?Goal: Ability to maintain clinical measurements within normal limits will improve ?Outcome: Progressing ?Goal: Will remain free from infection ?Outcome: Progressing ?Goal: Diagnostic test results will improve ?Outcome: Progressing ?Goal: Respiratory complications will improve ?Outcome: Progressing ?Goal: Cardiovascular complication will be avoided ?Outcome: Progressing ?  ?Problem: Activity: ?Goal: Risk for activity intolerance will decrease ?Outcome: Progressing ?  ?Problem: Nutrition: ?Goal: Adequate nutrition will be maintained ?Outcome: Progressing ?  ?Problem: Coping: ?Goal: Level of anxiety will decrease ?Outcome: Progressing ?  ?Problem: Elimination: ?Goal: Will not experience complications related to bowel motility ?Outcome: Progressing ?Goal: Will not experience complications related to urinary retention ?Outcome: Progressing ?  ?Problem: Pain Managment: ?Goal: General experience of comfort will improve ?Outcome: Progressing ?  ?Problem: Safety: ?Goal: Ability to remain free from injury will improve ?Outcome: Progressing ?  ?Problem: Skin Integrity: ?Goal: Risk for impaired skin integrity will decrease ?Outcome: Progressing ?  ?

## 2021-04-16 NOTE — Assessment & Plan Note (Signed)
-   Strict input and output.  Daily weights.  Fluid restriction.  Negative balance of 6802.9 cc since admission.  Continue metoprolol.  Continue IV Lasix.  Monitor creatinine. ?-Echo showed EF of 45 to 50% with global hypokinesis.  Consult cardiology ? ?

## 2021-04-16 NOTE — Assessment & Plan Note (Signed)
Stage II pressure injury right posterior thigh: Present on admission ?-Continue local wound care ?

## 2021-04-16 NOTE — Consult Note (Signed)
Cardiology Consultation:   Patient ID: [name] MRN: [id number]; DOB: 11/18/1950  Admit date: 04/13/2021 Date of Consult: 04/16/2021  PCP:  Bernerd Limbo, MD   [facility] Providers Cardiologist:  Sanda Klein, MD        Patient Profile:   [name] is a 71 y.o. male with a hx of permanent atrial fib, (related cardiomyopathy with tachycardia, CHF) recurrent venous thromboembolic events on background of superobesity, sleep apnea on CPAP and multiple orthopedic problems (18 previous knee surgeries) which limit his ability to be physically active who is being seen 04/16/2021 after admit of edema, CHF, Hypoxia, rt groin cellulitis for the evaluation of CHF in pt with permanent atrial fib at the request of Dr. Starla Link.  History of Present Illness:   Mr. Sitzer with above hx including selling in his legs for may years.  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 His a fib is permanent with severely dilated Lt atrium.  On anticoagulation in addition to thromboembolic events in past.  HR was controlled last time in office in 2021.    Prior echo with EF 50-55%, no RWMA, LA severely dilated, RA mod dilated. RV mildly dilated.  I do not see any ischemic evals.  Pt now presents from [facility] and is now on 4L [place] with chronic hypoxic respiratory failure, with Rt groin rash. Also scrotal swelling and grin edema for months. Lasat 4 days increased erythema.  On ER visit day priro to admit it was felt the edema was related to CHF,  IV lasix increased, he went back to SNF but symptoms did not improve.  Admitted with Rt groin cellulitis. On ABX CT abd/pelvis with subcutaneous edema in Rt ing. Region. Possible hydrocele with scrotal wall thickening.and cardiomegaly seen.  Scrotal U/S with Complex multiloculated cystic structure in the right scrotum.  Urology has seen and given recommendations.  PCXR with cardiomegaly no CHF.   Echo done  04/15/21 with EF 45-50%, global hypokinesis, RV mod enlarged, moderately elevated pulmonary artery systolic pressure.   LA mod dilated, RA size severely dilated. Was technically challenging, limited window.  EF visually unchanged from prior IVC very dilated and does not collapse.   EKG:  The EKG was personally reviewed and demonstrates:  atrial fib rate controlled and no acute ST changes from prior EKGs Telemetry:  Telemetry was personally reviewed and demonstrates:  atrial fib rate controlled  Na 140, K+ 4.1, CR 1.06 was 0.96 yesterday Hs troponin 7 BNP 228 Neg COvid Blood cultures and urine cultures pending.     BP 106/69 P 105 afebrile, HR in 80-90s occ to 105.  Confused, stated he was shopping at auto store and is now here, but when reminded he has been here since the 6th he knew that though confused on dates.  Past Medical History:  Diagnosis Date   CHF (congestive heart failure) ([place]) 05/2019   Chronic anticoagulation    with Xarelto   Complication of anesthesia    PT STATES HARD TO WAKE UP AFTER ONE SUGERY -STATES THE SURGERY TOOK LONGER THAN EXPECTED.  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   Shortness of breath    WITH EXERTION AND PAIN   Sleep apnea    uses CPAP,    Past Surgical History:  Procedure Laterality Date   2010 REMOVAL OF LEFT TOTAL KNEE     AMPUTATION Left 11/17/2019   Procedure: AMPUTATION ABOVE KNEE;  Surgeon: Newt Minion, MD;  Location: [place];  Service: Orthopedics;  Laterality: Left;   CARDIAC CATHETERIZATION  2006   CARDIOVERSION N/A 08/28/2012    Procedure: CARDIOVERSION;  Surgeon: Sanda Klein, MD;  Location: [place];  Service: Cardiovascular;  Laterality: N/A;   CARDIOVERSION N/A 09/01/2012   Procedure: CARDIOVERSION;  Surgeon: Pixie Casino, MD;  Location: [place];  Service: Cardiovascular;  Laterality: N/A;   COLONOSCOPY N/A 06/13/2013   Procedure: COLONOSCOPY;  Surgeon: Inda Castle, MD;  Location: [facility];  Service: Endoscopy;  Laterality: N/A;   EXCISIONAL TOTAL KNEE ARTHROPLASTY WITH ANTIBIOTIC SPACERS Left 09/21/2018   Procedure: Resection of tibia versus both components with placement of antibiotic spacer;  Surgeon: Paralee Cancel, MD;  Location: [facility];  Service: Orthopedics;  Laterality: Left;  2.5 hrs   EXCISIONAL TOTAL KNEE ARTHROPLASTY WITH ANTIBIOTIC SPACERS Left 01/02/2019   Procedure: Repeat Washout and placement of antibiotic spacer left knee;  Surgeon: Paralee Cancel, MD;  Location: [facility];  Service: Orthopedics;  Laterality: Left;  2 hrs   HYDROCELECTOY   2012   I & D EXTREMITY Left 08/14/2019   Procedure: IRRIGATION AND DEBRIDEMENT LEFT KNEE WOUND;  Surgeon: Paralee Cancel, MD;  Location: [facility];  Service: Orthopedics;  Laterality: Left;  60 mins   I & D KNEE WITH POLY EXCHANGE Left 05/17/2019   Procedure: IRRIGATION AND DEBRIDEMENT KNEE WITH POLY EXCHANGE;  Surgeon: Paralee Cancel, MD;  Location: [facility];  Service: Orthopedics;  Laterality: Left;  90 mins   IRRIGATION AND DEBRIDEMENT KNEE Left 04/03/2019   Procedure: REIMPLANTATION OF LEFT KNEE TIBIAL COMPONENT.;  Surgeon: Paralee Cancel, MD;  Location: [facility];  Service: Orthopedics;  Laterality: Left;  need 120 min   IRRIGATION AND DEBRIDEMENT KNEE Left 11/06/2019   Procedure: IRRIGATION AND DEBRIDEMENT KNEE;  Surgeon: Paralee Cancel, MD;  [facility]: [facility];  Service: Orthopedics;  Laterality: Left;  90 mins   LEFT KNEE ARTHROSCOPY  1999   LEFT KNEE SURGERY UPPER TIBIAL OSTOMY     LEFT TOTAL KNEE REMOVAL FOR INFECTION  2006   REIMPLANTATION LEFT TOTAL KNEE  2008    REIMPLANTATION LEFT TOTAL KNEE   2006   REIMPLANTATION OF TOTAL KNEE Left 06/23/2012   Procedure: REIMPLANTATION OF LEFT TOTAL KNEE;  Surgeon: Mauri Pole, MD;  Location: [facility];  Service: Orthopedics;  Laterality: Left;   REMOVAL LEFT TOTAL KNEE   2008   REPLACEMENT LEFT KNEE  2002   REPLACEMENT RIGHT KNEE  2003   REVISION LEFT KNEE CAP   2004   RIGHT KNEE ARTHROSCOPY  1998   TEE WITHOUT CARDIOVERSION N/A 08/28/2012   Procedure: TRANSESOPHAGEAL ECHOCARDIOGRAM (TEE);  Surgeon: Sanda Klein, MD;  Location: [place];  Service: Cardiovascular;  Laterality: N/A;   TONSILLECTOMY     TOTAL KNEE REVISION Left 12/15/2015   Procedure: TOTAL KNEE REVISION REPLACEMENT;  Surgeon: Paralee Cancel, MD;  Location: [facility];  Service: Orthopedics;  Laterality: Left;  Adductor Block  Home Medications:  Prior to Admission medications   Medication Sig Start Date End Date Taking? Authorizing Provider  acetaminophen (TYLENOL) 500 MG tablet Take 500 mg by mouth every 6 (six) hours as needed for mild pain or fever.   Yes [provider]  apixaban (ELIQUIS) 5 MG TABS tablet Take 5 mg by mouth 2 (two) times daily.   Yes [provider]  capsaicin (ZOSTRIX) 0.025 % cream Apply 1 application topically every 8 (eight) hours as needed (phantom limb pain on the left thigh/leg).   Yes [provider]  clotrimazole (LOTRIMIN) 1 % cream Apply 1 application topically 2 (two) times daily. 03/12/21  Yes [provider]  furosemide (LASIX) 20 MG tablet Take 2 tablets (40 mg total) by mouth daily for 7 days. 04/12/21 04/19/21 Yes Trifan, Carola Rhine, MD  gabapentin (NEURONTIN) 800 MG tablet Take 800 mg by mouth 3 (three) times daily. 07/21/20  Yes [provider]  guaiFENesin-dextromethorphan (ROBITUSSIN DM) 100-10 MG/5ML syrup Take 10 mLs by mouth every 4 (four) hours as needed for cough. Patient taking differently: Take 20 mLs by mouth every 4 (four) hours as needed for cough. 01/20/21  Yes  Shelly Coss, MD  levothyroxine (SYNTHROID) 125 MCG tablet Take 125 mcg by mouth daily before breakfast.  10/31/15  Yes [provider]  Melatonin 10 MG CAPS Take 10 mg by mouth at bedtime.   Yes [provider]  methocarbamol (ROBAXIN) 500 MG tablet Take 500 mg by mouth every 6 (six) hours as needed for muscle spasms. 12/10/20  Yes [provider]  metoprolol tartrate (LOPRESSOR) 50 MG tablet TAKE 1 TABLET BY MOUTH TWICE DAILY Patient taking differently: Take 50 mg by mouth every 12 (twelve) hours. 12/12/18  Yes Croitoru, Mihai, MD  Multiple Vitamin (MULTIVITAMIN WITH MINERALS) TABS Take 1 tablet by mouth daily.   Yes [provider]  potassium chloride (KLOR-CON M) 10 MEQ tablet Take 10 mEq by mouth daily. 01/06/21  Yes [provider]  vitamin B-12 (CYANOCOBALAMIN) 1000 MCG tablet Take 1,000 mcg by mouth daily.   Yes [provider]  vitamin C (ASCORBIC ACID) 500 MG tablet Take 500 mg by mouth daily.   Yes [provider]  zinc gluconate 50 MG tablet Take 50 mg by mouth daily.   Yes [provider]  albuterol (VENTOLIN HFA) 108 (90 Base) MCG/ACT inhaler Inhale 2 puffs into the lungs every 6 (six) hours as needed for wheezing or shortness of breath. Patient not taking: Reported on 04/13/2021 01/20/21   Shelly Coss, MD  diltiazem (CARDIZEM CD) 240 MG 24 hr capsule TAKE 1 CAPSULE BY MOUTH DAILY Patient not taking: Reported on 04/13/2021 10/10/18   Croitoru, Mihai, MD  naphazoline-glycerin (CLEAR EYES REDNESS) 0.012-0.2 % SOLN Place 1-2 drops into both eyes 4 (four) times daily as needed for eye irritation. Patient not taking: Reported on 04/13/2021 11/21/19   Persons, Bevely Palmer, PA  XARELTO 20 MG TABS tablet TAKE 1 TABLET BY MOUTH DAILY WITH BREAKFAST Patient not taking: Reported on 04/13/2021 10/08/19   Croitoru, Dani Gobble, MD    Inpatient Medications: Scheduled Meds:  (feeding supplement) PROSource Plus  30 mL Oral BID BM    apixaban  5 mg Oral BID   vitamin C  500 mg Oral Daily   furosemide  40 mg Intravenous Q12H   gabapentin  800 mg Oral TID   levothyroxine  125 mcg Oral Q0600   mouth rinse  15 mL Mouth Rinse BID   metoprolol  tartrate  50 mg Oral Q12H   multivitamin with minerals  1 tablet Oral Daily   nutrition supplement (JUVEN)  1 packet Oral BID WC   Ensure Max Protein  11 oz Oral Daily   vitamin B-12  1,000 mcg Oral Daily   Continuous Infusions:  ceFEPime (MAXIPIME) IV 2 g (04/16/21 0729)   vancomycin 1,250 mg (04/16/21 0849)   PRN Meds: acetaminophen **OR** acetaminophen, methocarbamol  Allergies:    Allergies  Allergen Reactions   Morphine Hives    Social History:   Social History   Socioeconomic History   Marital status: Single    Spouse name: Not on file   Number of children: Not on file   Years of education: Not on file   Highest education level: Not on file  Occupational History   Not on file  Tobacco Use   Smoking status: Never   Smokeless tobacco: Never  Vaping Use   Vaping Use: Never used  Substance and Sexual Activity   Alcohol use: Not Currently    Comment: not since 1999. However, had around 6 drinks a day between 1993-1999, roughly.   Drug use: No   Sexual activity: Yes    Partners: Female  Other Topics Concern   Not on file  Social History Narrative   Not on file   Social Determinants of Health   Financial Resource Strain: Not on file  Food Insecurity: Not on file  Transportation Needs: Not on file  Physical Activity: Not on file  Stress: Not on file  Social Connections: Not on file  Intimate Partner Violence: Not on file    Family History:    Family History  Problem Relation Age of Onset   Heart disease Father 68   Pancreatic cancer Sister    Liver cancer Sister    Cervical cancer Sister    Breast cancer Sister    Diabetes Sister    Colon cancer Neg Hx    Throat cancer Neg Hx    Stomach cancer Neg Hx    Kidney disease Neg Hx    Liver  disease Neg Hx      ROS:  Please see the history of present illness.  General:no colds or fevers, no weight changes Skin:no rashes or ulcers HEENT:no blurred vision, no congestion CV:see HPI PUL:see HPI GI:no diarrhea constipation or melena, no indigestion GU:no hematuria, no dysuria MS:no joint pain, no claudication. Lt AKA Neuro:no syncope, no lightheadedness Endo:no diabetes, + thyroid disease  All other ROS reviewed and negative.     Physical Exam/Data:   Vitals:   04/15/21 1409 04/15/21 2003 04/15/21 2014 04/16/21 0402  BP: (!) 108/47  (!) 107/58 106/69  Pulse: 64 99 93 (!) 105  Resp: (!) 23 18 (!) 24 20  Temp: 98.6 F (37 C)  100.3 F (37.9 C) 98.7 F (37.1 C)  TempSrc: Oral     SpO2: 91%  92% 97%  Weight:    (!) 183.7 kg    Intake/Output Summary (Last 24 hours) at 04/16/2021 1112 Last data filed at 04/16/2021 8250 Gross per 24 hour  Intake 2140 ml  Output 3950 ml  Net -1810 ml   Last 3 Weights 04/16/2021 04/15/2021 04/14/2021  Weight (lbs) 405 lb 402 lb 402 lb  Weight (kg) 183.707 kg 182.346 kg 182.346 kg     Body mass index is 61.58 kg/m.  General:  Well nourished, well developed, in no acute distress HEENT: normal Neck: no JVD though difficult to see with beard  and thick neck.  Vascular: No carotid bruits; Distal pulses 2+ bilaterally Cardiac:  irreg irreg; no murmur gallup rub or click Lungs:  clear to auscultation bilaterally, no wheezing, rhonchi or rales ant. Abd: soft, nontender, no hepatomegaly  Ext: 1+ edema on Rt, none at Lt AKA stump some scrotal edema Musculoskeletal:  No deformities, BUE and BLE strength normal and equal Skin: warm and dry  Neuro:  CNs 2-12 intact, no focal abnormalities noted Psych:  Normal affect    Relevant CV Studies: Echo 04/15/21 IMPRESSIONS     1. Left ventricular ejection fraction, by estimation, is 45 to 50%. The  left ventricle has mildly decreased function. The left ventricle  demonstrates global hypokinesis. Left  ventricular diastolic function could  not be evaluated.   2. Right ventricular systolic function was not well visualized. The right  ventricular size is moderately enlarged. There is moderately elevated  pulmonary artery systolic pressure.   3. Left atrial size was moderately dilated.   4. Right atrial size was severely dilated.   5. The mitral valve is grossly normal. Trivial mitral valve  regurgitation. No evidence of mitral stenosis.   6. The aortic valve was not well visualized. Aortic valve regurgitation  is not visualized. No aortic stenosis is present.   7. Pulmonic valve regurgitation not visualized.   8. The inferior vena cava is dilated in size with <50% respiratory  variability, suggesting right atrial pressure of 15 mmHg.   Comparison(s): Prior images reviewed side by side. Visually studies appear  similar when viewed side by side.   Conclusion(s)/Recommendation(s): Technically challenging images with  limited windows, recommend echo contrast for future studies. EF visually  unchanged from prior, though appears mildly abnormal on both studies. RV  appears enlarged but cannot be well  seen. IVC very dilated and does not collapse.   FINDINGS   Left Ventricle: Left ventricular ejection fraction, by estimation, is 45  to 50%. The left ventricle has mildly decreased function. The left  ventricle demonstrates global hypokinesis. The left ventricular internal  cavity size was normal in size. There is   borderline left ventricular hypertrophy. Left ventricular diastolic  function could not be evaluated due to atrial fibrillation. Left  ventricular diastolic function could not be evaluated.   Right Ventricle: The right ventricular size is moderately enlarged. Right  vetricular wall thickness was not well visualized. Right ventricular  systolic function was not well visualized. There is moderately elevated  pulmonary artery systolic pressure. The   tricuspid regurgitant  velocity is 2.74 m/s, and with an assumed right  atrial pressure of 15 mmHg, the estimated right ventricular systolic  pressure is 56.3 mmHg.   Left Atrium: Left atrial size was moderately dilated.   Right Atrium: Right atrial size was severely dilated.   Pericardium: There is no evidence of pericardial effusion. Presence of  epicardial fat layer.   Mitral Valve: The mitral valve is grossly normal. Trivial mitral valve  regurgitation. No evidence of mitral valve stenosis.   Tricuspid Valve: The tricuspid valve is grossly normal. Tricuspid valve  regurgitation is trivial. No evidence of tricuspid stenosis.   Aortic Valve: The aortic valve was not well visualized. Aortic valve  regurgitation is not visualized. No aortic stenosis is present. Aortic  valve mean gradient measures 2.7 mmHg. Aortic valve peak gradient measures  4.9 mmHg. Aortic valve area, by VTI  measures 5.78 cm.   Pulmonic Valve: The pulmonic valve was not well visualized. Pulmonic valve  regurgitation not visualized.  Aorta: The ascending aorta was not well visualized.   Venous: The inferior vena cava is dilated in size with less than 50%  respiratory variability, suggesting right atrial pressure of 15 mmHg.   IAS/Shunts: The interatrial septum was not well visualized.   Echo 10/11/12 Study Conclusions   - Left ventricle: The cavity size was normal. Wall thickness    was normal. Systolic function was normal. The estimated    ejection fraction was in the range of 50% to 55%. Wall    motion was normal; there were no regional wall motion    abnormalities.  - Left atrium: The atrium was severely dilated.  - Right ventricle: The cavity size was mildly dilated. Wall    thickness was normal.  - Right atrium: The atrium was moderately dilated.  - Atrial septum: No defect or patent foramen ovale was    identified.  Transthoracic echocardiography.  M-mode, limited 2D, limited  spectral Doppler, and color Doppler.   Height:  Height:  1734.8cm. Height: 683in.  Weight:  Weight: 170.6kg. Weight:  375.2lb.  Body mass index:  BMI: 0.6kg/m^2.  Body surface  area:    BSA: 7.28m2.  Blood pressure:     138/98.  Patient  status:  Outpatient.  Location:  Echo laboratory.   ------------------------------------------------------------   ------------------------------------------------------------  Left ventricle:  The cavity size was normal. Wall thickness  was normal. Systolic function was normal. The estimated  ejection fraction was in the range of 50% to 55%. Wall  motion was normal; there were no regional wall motion  abnormalities. The tissue Doppler parameters were normal.  There was no evidence of elevated ventricular filling  pressure by Doppler parameters.   ------------------------------------------------------------  Aortic valve:   Structurally normal valve.   Cusp separation  was normal.  Doppler:  Transvalvular velocity was within the  normal range. There was no stenosis.  No regurgitation.   ------------------------------------------------------------  Aorta:  The aorta was poorly visualized.   ------------------------------------------------------------  Mitral valve:   Structurally normal valve.   Leaflet  separation was normal.  Doppler:  Transvalvular velocity was  within the normal range. There was no evidence for stenosis.   No regurgitation.    Valve area by pressure half-time:  2.65cm^2. Indexed valve area by pressure half-time:  0.36cm^2/m^2.    Mean gradient: 134mHg (D). Peak gradient:  78m76mg (D).   ------------------------------------------------------------  Left atrium:  The atrium was severely dilated.   ------------------------------------------------------------  Atrial septum:  No defect or patent foramen ovale was  identified.   ------------------------------------------------------------  Right ventricle:  The cavity size was mildly dilated. Wall  thickness was  normal. Systolic function was normal.   ------------------------------------------------------------  Pulmonic valve:    Structurally normal valve.   Cusp  separation was normal.  Doppler:  Transvalvular velocity was  within the normal range.  No regurgitation.   ------------------------------------------------------------  Tricuspid valve:   Structurally normal valve.   Leaflet  separation was normal.  Doppler:  Transvalvular velocity was  within the normal range.  No significant regurgitation.   ------------------------------------------------------------  Pulmonary artery:    Systolic pressure could not be  accurately estimated.    Laboratory Data:  High Sensitivity Troponin:   Recent Labs  Lab 04/13/21 0800 04/13/21 1215  TROPONINIHS 6 7     Chemistry Recent Labs  Lab 04/13/21 1223 04/14/21 0528 04/15/21 0517 04/16/21 0505  NA  --  139 139 140  K  --  4.3 3.9 4.1  CL  --  98 96* 94*  CO2  --  34* 37* 41*  GLUCOSE  --  85 114* 111*  BUN  --  '12 12 19  '$ CREATININE  --  0.99 0.96 1.06  CALCIUM  --  8.9 9.1 9.5  MG 2.2  --  2.1 2.3  GFRNONAA  --  >60 >60 >60  ANIONGAP  --  '7 6 5    '$ Recent Labs  Lab 04/13/21 0800 04/14/21 0528  PROT 6.4* 6.6  ALBUMIN 3.0* 2.9*  AST 17 18  ALT 13 12  ALKPHOS 83 76  BILITOT 0.8 1.1   Lipids No results for input(s): CHOL, TRIG, HDL, LABVLDL, LDLCALC, CHOLHDL in the last 168 hours.  Hematology Recent Labs  Lab 04/13/21 0800 04/14/21 0528 04/15/21 0517  WBC 3.6* 4.0 3.8*  RBC 3.42* 3.49* 3.25*  HGB 10.5* 10.8* 10.0*  HCT 37.0* 38.1* 35.5*  MCV 108.2* 109.2* 109.2*  MCH 30.7 30.9 30.8  MCHC 28.4* 28.3* 28.2*  RDW 18.6* 18.5* 17.6*  PLT 212 225 191   Thyroid No results for input(s): TSH, FREET4 in the last 168 hours.  BNP Recent Labs  Lab 04/13/21 0830  BNP 228.1*    DDimer No results for input(s): DDIMER in the last 168 hours.   Radiology/Studies:  US SCROTUM  Result Date: 04/13/2021 CLINICAL DATA:   Hydrocele. EXAM: ULTRASOUND OF SCROTUM TECHNIQUE: Complete ultrasound examination of the testicles, epididymis, and other scrotal structures was performed. COMPARISON:  CT abdomen and pelvis 04/13/2021. Scrotal ultrasound 01/29/2021. FINDINGS: Right testicle Measurements: 4.2 x 2.4 x 2.7 cm. Again seen is an anechoic cyst within or adjacent to end deforming the right testicle measuring 2.1 x 2.0 x 2.1 cm, unchanged from prior. Vascular flow is identified in the right testicle. Left testicle Measurements: 3.6 x 2.2 x 2.2 cm. No mass or microlithiasis visualized. Vascular flow is identified in the left testicle. Right epididymis:  Not well visualized. Left epididymis:  Not seen. Hydrocele: Complex multiloculated right hydrocele again noted. This measures 7.3 x 4.9 x 4.9 cm. Varicocele:  None visualized. Other: There is bilateral scrotal wall skin thickening and edema measuring 2.8 cm on the right and 6.0 cm on the left. IMPRESSION: 1. Complex multiloculated cystic structure in the right scrotum is again seen. Findings are worrisome for complex hydroceles. 2. Unchanged 2.1 cm cystic area adjacent to or within the right testicle. 3. Marked scrotal wall edema, left greater than right. Electronically Signed   By: Ronney Asters M.D.   On: 04/13/2021 21:45   CT ABDOMEN PELVIS W CONTRAST  Result Date: 04/13/2021 CLINICAL DATA:  Abdominal pain, acute nonlocalized. Perineum pain/erythema. Evaluation for infection. EXAM: CT ABDOMEN AND PELVIS WITH CONTRAST TECHNIQUE: Multidetector CT imaging of the abdomen and pelvis was performed using the standard protocol following bolus administration of intravenous contrast. RADIATION DOSE REDUCTION: This exam was performed according to the departmental dose-optimization program which includes automated exposure control, adjustment of the mA and/or kV according to patient size and/or use of iterative reconstruction technique. CONTRAST:  152m OMNIPAQUE IOHEXOL 300 MG/ML  SOLN  COMPARISON:  CT examination dated January 25, 2013 FINDINGS: Lower chest: Bibasilar subsegmental atelectasis. Heart is enlarged. Coronary artery atherosclerotic calcifications. Hepatobiliary: No focal liver abnormality is seen. Multiple gallstones. No gallbladder wall thickening or pericholecystic inflammatory changes. No biliary ductal dilatation. Pancreas: Moderate pancreatic atrophy. No pancreatic ductal dilatation or surrounding inflammatory changes. Spleen: Normal in size without focal abnormality. Adrenals/Urinary Tract: Adrenal glands are unremarkable. Kidneys are normal, without renal calculi, focal  lesion, or hydronephrosis. Bladder is unremarkable. Stomach/Bowel: Stomach is within normal limits. Appendix appears normal. No evidence of bowel wall thickening, distention, or inflammatory changes. Vascular/Lymphatic: Aortic atherosclerosis. No enlarged abdominal or pelvic lymph nodes. Reproductive: Prostate is unremarkable. Scrotal wall thickening with possible hydrocele. Other: There is subcutaneous soft tissue swelling prominent in the right groin/inguinal region. No drainable fluid collection or abscess. There are few mildly enlarged lymph node with fatty hilum, likely reactive. No abdominal wall hernia or abnormality. No abdominopelvic ascites. Musculoskeletal: Multilevel degenerative disc disease of the lumbar spine. No acute osseous abnormality. IMPRESSION: 1. Subcutaneous edema in the right inguinal region/perineum with mild skin thickening. No drainable fluid collection or abscess. Findings suggest early cellulitis/infectious/inflammatory process. Clinical correlation and continued follow-up is recommended. 2. Scrotal wall thickening with possible hydrocele. Scrotal sonogram could be obtained for further evaluation. 3.  Cholelithiasis without evidence of acute cholecystitis. 4.  Cardiomegaly with bibasilar atelectasis. Electronically Signed   By: Keane Police D.O.   On: 04/13/2021 12:24   DG Chest  Port 1 View  Result Date: 04/13/2021 CLINICAL DATA:  Shortness of breath EXAM: PORTABLE CHEST 1 VIEW COMPARISON:  01/15/2021 FINDINGS: Transverse diameter of heart is increased. There are no signs of pulmonary edema or focal pulmonary consolidation. There is poor inspiration. Left lateral CP angle is indistinct. This may be due to pleural thickening or minimal effusion. There is no pneumothorax. IMPRESSION: Cardiomegaly. There are no signs of pulmonary edema or focal pulmonary consolidation. Electronically Signed   By: Elmer Picker M.D.   On: 04/13/2021 09:20   ECHOCARDIOGRAM COMPLETE  Result Date: 04/15/2021    ECHOCARDIOGRAM REPORT   Patient Name:   SHELIA MAGALLON Date of Exam: 04/15/2021 Medical Rec #:  [id number]          Height:       68.0 in Accession #:    [id number]         Weight:       402.0 lb Date of Birth:  03-05-1950           BSA:          2.750 m Patient Age:    56 years           BP:           108/47 mmHg Patient Gender: M                  HR:           90 bpm. Exam Location:  Inpatient Procedure: 2D Echo, Cardiac Doppler and Color Doppler Indications:    CHF  History:        Patient has prior history of Echocardiogram examinations, most                 recent 10/11/2012.  Sonographer:    Luisa Hart RDCS Referring Phys: [id number] [facility]  Sonographer Comments: Patient is morbidly obese and Technically difficult study due to poor echo windows. Image acquisition challenging due to patient body habitus. IMPRESSIONS  1. Left ventricular ejection fraction, by estimation, is 45 to 50%. The left ventricle has mildly decreased function. The left ventricle demonstrates global hypokinesis. Left ventricular diastolic function could not be evaluated.  2. Right ventricular systolic function was not well visualized. The right ventricular size is moderately enlarged. There is moderately elevated pulmonary artery systolic pressure.  3. Left atrial size was moderately dilated.  4. Right atrial size  was severely dilated.  5. The mitral valve is  grossly normal. Trivial mitral valve regurgitation. No evidence of mitral stenosis.  6. The aortic valve was not well visualized. Aortic valve regurgitation is not visualized. No aortic stenosis is present.  7. Pulmonic valve regurgitation not visualized.  8. The inferior vena cava is dilated in size with <50% respiratory variability, suggesting right atrial pressure of 15 mmHg. Comparison(s): Prior images reviewed side by side. Visually studies appear similar when viewed side by side. Conclusion(s)/Recommendation(s): Technically challenging images with limited windows, recommend echo contrast for future studies. EF visually unchanged from prior, though appears mildly abnormal on both studies. RV appears enlarged but cannot be well seen. IVC very dilated and does not collapse. FINDINGS  Left Ventricle: Left ventricular ejection fraction, by estimation, is 45 to 50%. The left ventricle has mildly decreased function. The left ventricle demonstrates global hypokinesis. The left ventricular internal cavity size was normal in size. There is  borderline left ventricular hypertrophy. Left ventricular diastolic function could not be evaluated due to atrial fibrillation. Left ventricular diastolic function could not be evaluated. Right Ventricle: The right ventricular size is moderately enlarged. Right vetricular wall thickness was not well visualized. Right ventricular systolic function was not well visualized. There is moderately elevated pulmonary artery systolic pressure. The  tricuspid regurgitant velocity is 2.74 m/s, and with an assumed right atrial pressure of 15 mmHg, the estimated right ventricular systolic pressure is 01.0 mmHg. Left Atrium: Left atrial size was moderately dilated. Right Atrium: Right atrial size was severely dilated. Pericardium: There is no evidence of pericardial effusion. Presence of epicardial fat layer. Mitral Valve: The mitral valve is grossly  normal. Trivial mitral valve regurgitation. No evidence of mitral valve stenosis. Tricuspid Valve: The tricuspid valve is grossly normal. Tricuspid valve regurgitation is trivial. No evidence of tricuspid stenosis. Aortic Valve: The aortic valve was not well visualized. Aortic valve regurgitation is not visualized. No aortic stenosis is present. Aortic valve mean gradient measures 2.7 mmHg. Aortic valve peak gradient measures 4.9 mmHg. Aortic valve area, by VTI measures 5.78 cm. Pulmonic Valve: The pulmonic valve was not well visualized. Pulmonic valve regurgitation not visualized. Aorta: The ascending aorta was not well visualized. Venous: The inferior vena cava is dilated in size with less than 50% respiratory variability, suggesting right atrial pressure of 15 mmHg. IAS/Shunts: The interatrial septum was not well visualized.  LEFT VENTRICLE PLAX 2D LVIDd:         5.20 cm LVIDs:         3.20 cm LV PW:         1.10 cm LV IVS:        1.20 cm LVOT diam:     2.80 cm LV SV:         92 LV SV Index:   34 LVOT Area:     6.16 cm  LV Volumes (MOD) LV vol d, MOD A2C: 67.3 ml LV vol d, MOD A4C: 58.7 ml LV vol s, MOD A2C: 28.2 ml LV vol s, MOD A4C: 38.7 ml LV SV MOD A2C:     39.1 ml LV SV MOD A4C:     58.7 ml LV SV MOD BP:      29.8 ml RIGHT VENTRICLE RV Basal diam:  5.30 cm RV Mid diam:    3.60 cm LEFT ATRIUM              Index        RIGHT ATRIUM           Index LA diam:  4.90 cm  1.78 cm/m   RA Area:     56.70 cm LA Vol (A2C):   122.0 ml 44.37 ml/m  RA Volume:   267.00 ml 97.10 ml/m LA Vol (A4C):   117.0 ml 42.55 ml/m LA Biplane Vol: 124.0 ml 45.10 ml/m  AORTIC VALVE AV Area (Vmax):    5.51 cm AV Area (Vmean):   5.21 cm AV Area (VTI):     5.78 cm AV Vmax:           110.67 cm/s AV Vmean:          75.633 cm/s AV VTI:            0.160 m AV Peak Grad:      4.9 mmHg AV Mean Grad:      2.7 mmHg LVOT Vmax:         99.00 cm/s LVOT Vmean:        64.000 cm/s LVOT VTI:          0.150 m LVOT/AV VTI ratio: 0.94  AORTA  Ao Root diam: 3.40 cm Ao Asc diam:  3.20 cm MR Peak grad: 48.2 mmHg    TRICUSPID VALVE MR Vmax:      347.00 cm/s  TR Peak grad:   30.0 mmHg MV E velocity: 78.93 cm/s  TR Vmax:        274.00 cm/s                             SHUNTS                            Systemic VTI:  0.15 m                            Systemic Diam: 2.80 cm Buford Dresser MD Electronically signed by Buford Dresser MD Signature Date/Time: 04/15/2021/4:16:05 PM    Final      Assessment and Plan:   Acute on chronic combined systolic and diastolic CHF.  EF is lower than in 2014 but per note looks similar.  On lasix 40 mg IV BID and is neg 7, 422.  Wt though is increased by a KG if accurate, continue to monitor.   Permanent atrial fib rate controlled on lopressor 50 BID, (home dose) dilt previously at 240 mg daily has been stopped ? With decrease in EF will ask Dr. Harriet Masson to eval.  Is on eliquis  thought to change to coreg and if EF has decreased consider entresto and GDMT Rt groin cellulitis per IM on ABX  Chronic resp failure with hypoxia on home 02 and CPAP at night.  Hx of DVT on Eliquis   Risk Assessment/Risk Scores:        New York Heart Association (NYHA) Functional Class NYHA Class II  CHA2DS2-VASc Score = 3   This indicates a 3.2% annual risk of stroke. The patient's score is based upon: CHF History: 1 HTN History: 1 Diabetes History: 0 Stroke History: 0 Vascular Disease History: 0 Age Score: 1 Gender Score: 0         For questions or updates, please contact [place] Please consult [email or web address] for contact info under    Signed, Cecilie Kicks, NP  04/16/2021 11:12 AM

## 2021-04-16 NOTE — NC FL2 (Signed)
?[place] MEDICAID FL2 LEVEL OF CARE SCREENING TOOL  ?  ? ?IDENTIFICATION  ?Patient Name: ?[name] Birthdate: 12-May-1950 Sex: male Admission Date (Current Location): ?04/13/2021  ?[place] and [place] Number: ? [place] ?  Facility and Address:  ?[facility],  [address] [place], [place] ?     Provider Number: ?[id number]  ?Attending Physician Name and Address:  ?Aline August, MD ? Relative Name and Phone Number:  ?Evans Lance (Sister)   [phone] ?   ?Current Level of Care: ?Hospital Recommended Level of Care: ?[place] Prior Approval Number: ?  ? ?Date Approved/Denied: ?  PASRR Number: ?[id number] A ? ?Discharge Plan: ?SNF ?  ? ?Current Diagnoses: ?Patient Active Problem List  ? Diagnosis Date Noted  ? Acute on chronic diastolic CHF (congestive heart failure) ([place]) 04/16/2021  ? Anemia of chronic disease 04/15/2021  ? Macrocytosis 04/15/2021  ? Cellulitis of groin, right 04/14/2021  ? Chronic respiratory failure with hypoxia ([place]) 04/14/2021  ? Pressure injury of skin 04/14/2021  ? Acute respiratory failure with hypoxia ([place]) 01/19/2021  ? Hypothyroidism   ? Macrocytic anemia   ? Morbid obesity with BMI of 60.0-69.9, adult ([place])   ? Pneumonia due to COVID-19 virus 01/15/2021  ? Cellulitis of left leg   ? 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 Septic joint of left knee joint ([place]) 11/06/2019  ? 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 Open knee wound 08/14/2019  ? S/P left TK revision 04/03/2019  ? Administration of long-term prophylactic antibiotics   ? History of streptococcal infection   ? History of DVT (deep vein thrombosis)   ? S/P rev left TK 12/15/2015  ? 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  ? Preoperative cardiovascular examination 04/17/2013  ? OSA (obstructive sleep apnea) 11/27/2012  ? Chronic anticoagulation, with Xarelto 09/10/2012  ? DVT (deep venous thrombosis),  possible 09/10/2012  ? SOB (shortness of breath) 08/26/2012  ? Chest discomfort 08/26/2012  ? Persistent atrial fibrillation ([place]) 08/26/2012  ? Super obesity 06/27/2012  ? Expected blood loss anemia 06/27/2012  ? S/P left TK revision 06/23/2012  ? Septic arthritis of knee ([place])   ? Cellulitis 09/19/2010  ? METHICILLIN RESISTANT STAPHYLOCOCCUS AUREUS INFECTION 09/10/2009  ? STREPTOCOCCUS INFECTION CCE & UNS SITE GROUP C 07/04/2008  ? DM 07/04/2008  ? CHRONIC KIDNEY DISEASE UNSPECIFIED 07/04/2008  ? PULMONARY EMBOLISM, HX OF 07/04/2008  ? INFECTION DUE TO INTERNAL ORTH DEVICE NEC 03/02/2006  ? ? ?Orientation RESPIRATION BLADDER Height & Weight   ?  ?Self, Situation, Place ? O2 (4L [place]; CPAP at night) External catheter Weight: (!) 183.7 kg ?Height:     ?BEHAVIORAL SYMPTOMS/MOOD NEUROLOGICAL BOWEL NUTRITION STATUS  ?    Continent Diet (see d/c summary)  ?AMBULATORY STATUS COMMUNICATION OF NEEDS Skin   ?Extensive Assist Verbally Other (Comment) (Pressure Injury, R posterior thigh) ?  ?  ?  ?    ?     ?     ? ? ?Personal Care Assistance Level of Assistance  ?Bathing, Feeding, Dressing Bathing Assistance: Maximum assistance ?Feeding assistance: Independent ?Dressing Assistance: Maximum assistance ?   ? ?Functional Limitations Info  ?Sight, Hearing, Speech Sight Info: Adequate ?Hearing Info: Adequate ?Speech Info: Adequate  ? ? ?SPECIAL CARE FACTORS FREQUENCY  ?    ?  ?  ?  ?  ?  ?  ?   ? ? ?Contractures Contractures Info: Not present  ? ? ?  Additional Factors Info  ?Code Status, Allergies Code Status Info: full ?Allergies Info: morphine ?  ?  ?  ?   ? ?Current Medications (04/16/2021):  This is the current hospital active medication list ?Current Facility-Administered Medications  ?Medication Dose Route Frequency Provider Last Rate Last Admin  ? (feeding supplement) PROSource Plus liquid 30 mL  30 mL Oral BID BM Alekh, Kshitiz, MD   30 mL at 04/15/21 1408  ? acetaminophen (TYLENOL) tablet 650 mg  650 mg Oral Q6H PRN Marylyn Ishihara,  Tyrone A, DO   650 mg at 04/15/21 2111  ? Or  ? acetaminophen (TYLENOL) suppository 650 mg  650 mg Rectal Q6H PRN Marylyn Ishihara, Tyrone A, DO      ? apixaban (ELIQUIS) tablet 5 mg  5 mg Oral BID Marylyn Ishihara, Tyrone A, DO   5 mg at 04/15/21 2111  ? ascorbic acid (VITAMIN C) tablet 500 mg  500 mg Oral Daily Kyle, Tyrone A, DO   500 mg at 04/15/21 0900  ? ceFEPIme (MAXIPIME) 2 g in sodium chloride 0.9 % 100 mL IVPB  2 g Intravenous Q8H Adrian Saran, RPH 200 mL/hr at 04/16/21 0729 2 g at 04/16/21 7494  ? furosemide (LASIX) injection 40 mg  40 mg Intravenous Q12H Alekh, Kshitiz, MD   40 mg at 04/16/21 0505  ? gabapentin (NEURONTIN) capsule 800 mg  800 mg Oral TID Cherylann Ratel A, DO   800 mg at 04/15/21 2111  ? levothyroxine (SYNTHROID) tablet 125 mcg  125 mcg Oral Q0600 Cherylann Ratel A, DO   125 mcg at 04/16/21 0505  ? MEDLINE mouth rinse  15 mL Mouth Rinse BID Marylyn Ishihara, Tyrone A, DO   15 mL at 04/15/21 2113  ? methocarbamol (ROBAXIN) tablet 500 mg  500 mg Oral Q6H PRN Marylyn Ishihara, Tyrone A, DO      ? metoprolol tartrate (LOPRESSOR) tablet 50 mg  50 mg Oral Q12H Kyle, Tyrone A, DO   50 mg at 04/15/21 2111  ? multivitamin with minerals tablet 1 tablet  1 tablet Oral Daily Kyle, Tyrone A, DO   1 tablet at 04/15/21 0900  ? nutrition supplement (JUVEN) (JUVEN) powder packet 1 packet  1 packet Oral BID WC Aline August, MD   1 packet at 04/15/21 1731  ? protein supplement (ENSURE MAX) liquid  11 oz Oral Daily Alekh, Kshitiz, MD   11 oz at 04/15/21 1530  ? vancomycin (VANCOREADY) IVPB 1250 mg/250 mL  1,250 mg Intravenous Q12H Randa Spike, RPH 166.7 mL/hr at 04/15/21 2028 1,250 mg at 04/15/21 2028  ? vitamin B-12 (CYANOCOBALAMIN) tablet 1,000 mcg  1,000 mcg Oral Daily Kyle, Tyrone A, DO   1,000 mcg at 04/15/21 0900  ? ? ? ?Discharge Medications: ?Please see discharge summary for a list of discharge medications. ? ?Relevant Imaging Results: ? ?Relevant Lab Results: ? ? ?Additional Information ?SS# [id number] ? ?Trish Mage, LCSW ? ? ? ? ?

## 2021-04-17 DIAGNOSIS — L03314 Cellulitis of groin: Secondary | ICD-10-CM | POA: Diagnosis not present

## 2021-04-17 DIAGNOSIS — I4821 Permanent atrial fibrillation: Secondary | ICD-10-CM

## 2021-04-17 DIAGNOSIS — R5381 Other malaise: Secondary | ICD-10-CM

## 2021-04-17 DIAGNOSIS — I5043 Acute on chronic combined systolic (congestive) and diastolic (congestive) heart failure: Secondary | ICD-10-CM | POA: Diagnosis not present

## 2021-04-17 DIAGNOSIS — D638 Anemia in other chronic diseases classified elsewhere: Secondary | ICD-10-CM | POA: Diagnosis not present

## 2021-04-17 DIAGNOSIS — I5023 Acute on chronic systolic (congestive) heart failure: Secondary | ICD-10-CM | POA: Insufficient documentation

## 2021-04-17 DIAGNOSIS — J9611 Chronic respiratory failure with hypoxia: Secondary | ICD-10-CM | POA: Diagnosis not present

## 2021-04-17 DIAGNOSIS — G4733 Obstructive sleep apnea (adult) (pediatric): Secondary | ICD-10-CM | POA: Diagnosis not present

## 2021-04-17 LAB — CREATININE, SERUM
Creatinine, Ser: 0.7 mg/dL (ref 0.61–1.24)
GFR, Estimated: >60 mL/min (ref >60)

## 2021-04-17 LAB — VANCOMYCIN, PEAK: Vancomycin Pk: 38 ug/mL (ref 30–40)

## 2021-04-17 LAB — VANCOMYCIN, TROUGH: Vancomycin Tr: 27 ug/mL (ref 15–20)

## 2021-04-17 MED ORDER — METOPROLOL SUCCINATE ER 50 MG PO TB24
100.0000 mg | ORAL_TABLET | Freq: Every day | ORAL | Status: DC
  Administered 2021-04-18 – 2021-05-07 (×20): 100 mg via ORAL
  Filled 2021-04-17 (×22): qty 2

## 2021-04-17 MED ORDER — CLOTRIMAZOLE 1 % EX CREA
TOPICAL_CREAM | Freq: Two times a day (BID) | CUTANEOUS | Status: DC
  Administered 2021-04-17 – 2021-04-29 (×3): 1 via TOPICAL
  Filled 2021-04-17: qty 15

## 2021-04-17 MED ORDER — METOPROLOL SUCCINATE ER 50 MG PO TB24
50.0000 mg | ORAL_TABLET | Freq: Every day | ORAL | Status: AC
  Administered 2021-04-17: 50 mg via ORAL
  Filled 2021-04-17: qty 1

## 2021-04-17 MED ORDER — VANCOMYCIN HCL 1750 MG/350ML IV SOLN
1750.0000 mg | INTRAVENOUS | Status: DC
  Filled 2021-04-17: qty 350

## 2021-04-17 NOTE — Progress Notes (Signed)
Lab [name]) reported patient's vanc trop was 27. Nurse notified provider Garrett Holter, NP. Will continue to monitor.  ?

## 2021-04-17 NOTE — Assessment & Plan Note (Signed)
-   PT recommends SNF placement.  Social worker following.

## 2021-04-17 NOTE — Assessment & Plan Note (Signed)
" >>  ASSESSMENT AND PLAN FOR ACUTE ON CHRONIC COMBINED SYSTOLIC AND DIASTOLIC CHF (CONGESTIVE HEART FAILURE) (HCC) WRITTEN ON 04/22/2021  6:05 PM BY RIZWAN, SAIMA, MD   -Echo showed EF of 45 to 50% with global hypokinesis.   - IV Lasix   transitioned to Torsemide  on 3/12 - cont Losartan  and Toprol  "

## 2021-04-17 NOTE — TOC Progression Note (Addendum)
Transition of Care (TOC) - Progression Note  ? ? ?Patient Details  ?Name: [name] ?MRN: [id number] ?Date of Birth: 01-10-1951 ? ?Transition of Care (TOC) CM/SW Contact  ?Trish Mage, LCSW ?Phone Number: ?04/17/2021, 11:54 AM ? ?Clinical Narrative:   Net with patient to talk about bed offer.  He remembered no previous conversations with me-much more alert today.  He is frustrated because he does not have his cell phone from ALF, and his room phone is not working.  We called his sister to update her.  I attempted to answer her questions. Patient now understand that he is going to short term rehab, and that he will return to [facility] when he has completed rehab. Insurance authorization request initiated. TOC will continue to follow during the course of hospitalization. ? ?Unable to initiate request as latest PT note is from 3/7.  Spoke with PT supervisor who will make sure he is seen tomorrow AM if not later today. ?  ? ? ? ?Expected Discharge Plan: [place] ?Barriers to Discharge: Other (must enter comment) (pending cardiology sign off, insurance authorization) ? ?Expected Discharge Plan and Services ?Expected Discharge Plan: [place] ?  ?Discharge Planning Services: CM Consult ?Post Acute Care Choice: [place] ?Living arrangements for the past 2 months: [place] ?                ?  ?  ?  ?  ?  ?  ?  ?  ?  ?  ? ? ?Social Determinants of Health (SDOH) Interventions ?  ? ?Readmission Risk Interventions ?Readmission Risk Prevention Plan 11/09/2019  ?Transportation Screening Complete  ?PCP or Specialist Appt within 5-7 Days Complete  ?Home Care Screening Complete  ?Medication Review (RN CM) Complete  ?Some recent data might be hidden  ? ? ?

## 2021-04-17 NOTE — Progress Notes (Signed)
Pharmacy Antibiotic Note ? ?[name] is a 71 y.o. male admitted on 04/13/2021 with cellulitis.  Pharmacy has been consulted for vanc/cefepime dosing. ? ?Today, 04/17/21 ?- Vancomycin peak 38, trough 27 ?- Creatinine stable ? ?Plan: ?-Change vancomycin to 1750 mg IV q24h ?-Check vancomycin level with morning labs to ensure vancomycin is not accumulating prior to 1000 dose ?-Continue Cefepime 2g IV q8h ?-Follow up renal function, culture results, and clinical course. ? ? ?Height: '5\' 8"'$  (172.7 cm) ?Weight: (!) 183.7 kg (405 lb) ?IBW/kg (Calculated) : 68.4 ? ?Temp (24hrs), Avg:98 ?F (36.7 ?C), Min:97.8 ?F (36.6 ?C), Max:98.6 ?F (37 ?C) ? ?Recent Labs  ?Lab 04/13/21 ?0800 04/13/21 ?0902 04/13/21 ?1215 04/14/21 ?1749 04/15/21 ?4496 04/16/21 ?0505 04/17/21 ?0502 04/17/21 ?1129 04/17/21 ?1833  ?WBC 3.6*  --   --  4.0 3.8*  --   --   --   --   ?CREATININE 1.05  --   --  0.99 0.96 1.06 0.70  --   --   ?LATICACIDVEN  --  1.3 0.9  --   --   --   --   --   --   ?Alice  --   --   --   --   --   --   --   --  27*  ?VANCOPEAK  --   --   --   --   --   --   --  51  --   ? ?  ?Estimated Creatinine Clearance: 139.1 mL/min (by C-G formula based on SCr of 0.7 mg/dL).   ? ?Allergies  ?Allergen Reactions  ? Morphine Hives  ? ?Antimicrobials this admission:  ?3/6 Cefepime >> ?3/6 Vancomycin >>  ? ?Dose adjustments this admission:  ?3/10 Vancomycin 1250 mg IV q12h >> 1750 mg q24h ? ?Microbiology results:  ?3/6 Covid: neg; Influenza neg ?3/6 BCx: ngtd ?3/6 MRSA PCR: not detected ?3/6 UCx: < 10k colonies (insignificant growth) ? ?Thank you for allowing pharmacy to be a part of this patient?s care. ? ?Tawnya Crook, PharmD, BCPS ?Clinical Pharmacist ?04/17/2021 8:47 PM ? ? ? ?

## 2021-04-17 NOTE — Progress Notes (Signed)
?Progress Note ? ? ?Patient: [name] PPJ:[id number] DOB: April 17, 1950 DOA: 04/13/2021     4 ?DOS: the patient was seen and examined on 04/17/2021 ?  ?Brief hospital course: ?71 y.o. male with medical history significant of pAfib, Hx of DVT, chronic diastolic HF, hypothyroidism, morbid obesity, chronic hypoxic respiratory failure on 4L [place], intermittent scrotal and groin swelling for several months requiring multiple ED visits presented with increased shortness of breath along with groin swelling and redness.  On presentation, BNP 228.1; chest x-ray showed no signs of pulmonary edema or focal pulmonary consolidation.  CT of abdomen/pelvis showed subcutaneous edema in the right inguinal region/perineum with mild skin thickening but no drainable fluid collection or abscess along with scrotal wall thickening with possible hydrocele.  Urology was consulted.  Echo showed EF of 45 to 50%.  Cardiology was consulted on 04/16/2021. ? ?Assessment and Plan: ?Cellulitis of groin, right ?Scrotal cellulitis ?-CT imaging as above.  Scrotal ultrasound also showed mild scrotal edema along with possible complex hydroceles. ?-Continue broad-spectrum antibiotics. ?-Urology following and recommending conservative management with antibiotics; scrotal elevation and to continue Lotrimin cream to prevent/treat fungal infection. ? ?Chronic respiratory failure with hypoxia ([place]) ?OSA ?-Normally uses 3 to 4 L oxygen via nasal cannula during daytime and CPAP at night.  Continue same. ? ?Hypothyroidism ?- Continue Synthroid ? ?Persistent atrial fibrillation ([place]) ?History of DVT ?-Continue metoprolol and Eliquis ? ?Morbid obesity with BMI of 60.0-69.9, adult ([place]) ?- Outpatient follow-up ? ?Physical deconditioning ?- PT recommends SNF placement.  Social worker following. ? ?Acute on chronic combined systolic and diastolic CHF (congestive heart failure) ([place]) ?- Strict input and output.  Daily weights.  Fluid restriction.  Negative balance of  9603.6 cc since admission.  Continue metoprolol.  Continue IV Lasix.  Monitor creatinine. ?-Echo showed EF of 45 to 50% with global hypokinesis.  Cardiology consulted on 04/16/2021.  Follow further recommendations. ? ?Anemia of chronic disease ?Macrocytosis ?-Possibly from chronic illnesses.  Hemoglobin stable.  Monitor. ? ?Pressure injury of skin ?Stage II pressure injury right posterior thigh: Present on admission ?-Continue local wound care ? ? ?Subjective:  ?Patient seen and examined at bedside.  Poor historian, intermittently gets angry.  Denies chest pain, nausea, vomiting or fever.   ?Physical Exam: ?Vitals:  ? 04/16/21 2049 04/16/21 2250 04/17/21 0006 04/17/21 0500  ?BP: 106/60   123/71  ?Pulse: (!) 109 (!) 101  (!) 110  ?Resp: '20 20  20  '$ ?Temp: 97.9 ?F (36.6 ?C)   97.8 ?F (36.6 ?C)  ?TempSrc:      ?SpO2: 90% 94%  97%  ?Weight:    (!) 183.7 kg  ?Height:   '5\' 8"'$  (1.727 m)   ? ?General: On 4 L oxygen by nasal cannula.  No distress.  Looks chronically deconditioned and ?ENT/neck: No thyromegaly.  JVD is not elevated  ?respiratory: Decreased breath sounds at bases bilaterally with some crackles; no wheezing CVS: S1-S2 heard, tachycardic  ?abdominal: Soft, morbidly obese, nontender, slightly distended; no organomegaly, normal bowel sounds are heard ?Extremities: Left AKA present; right lower extremity edema present;; no cyanosis  ?CNS: Awake and alert.  Poor historian.  No focal neurologic deficit.  Moves extremities ?Lymph: No obvious lymphadenopathy ?Skin: No obvious ecchymosis/lesions  ?psych: Flat affect.  Does not participate in conversation much.  Intermittently gets angry. ?Musculoskeletal: No obvious other joint swelling/deformity ? ?Data Reviewed: ?I have reviewed patient's investigations during this hospitalization myself.  Today, creatinine is 0.7.  No other labs. ?Family Communication: None at  bedside ?  ?Disposition: ?Status is: Inpatient ?Remains inpatient appropriate because: Of need for IV  antibiotics and Lasix ? Planned Discharge Destination: Skilled nursing facility in 1 to 2 days once clinically improved and cleared by cardiology.  Social worker following regarding the same  ? ? ?Author: ?Aline August, MD ?04/17/2021 7:54 AM ? ?For on call review [email or web address].  ?

## 2021-04-17 NOTE — Plan of Care (Signed)
?  Problem: Education: ?Goal: Knowledge of General Education information will improve ?Description: Including pain rating scale, medication(s)/side effects and non-pharmacologic comfort measures ?Outcome: Progressing ?  ?Problem: Clinical Measurements: ?Goal: Cardiovascular complication will be avoided ?Outcome: Progressing ?  ?Problem: Clinical Measurements: ?Goal: Respiratory complications will improve ?Outcome: Progressing ?  ?Problem: Nutrition: ?Goal: Adequate nutrition will be maintained ?Outcome: Progressing ?  ?Problem: Coping: ?Goal: Level of anxiety will decrease ?Outcome: Progressing ?  ?Problem: Elimination: ?Goal: Will not experience complications related to bowel motility ?Outcome: Progressing ?Goal: Will not experience complications related to urinary retention ?Outcome: Progressing ?  ?Problem: Pain Managment: ?Goal: General experience of comfort will improve ?Outcome: Progressing ?  ?Problem: Safety: ?Goal: Ability to remain free from injury will improve ?Outcome: Progressing ?  ?Problem: Skin Integrity: ?Goal: Risk for impaired skin integrity will decrease ?Outcome: Progressing ?  ?

## 2021-04-17 NOTE — Progress Notes (Signed)
Nurse received a call from Pharmacy in reference to patient's vancomycin lab. Pharmacy will adjust doses on med. See mar for intervention. Will continue to monitor.  ?

## 2021-04-17 NOTE — Assessment & Plan Note (Addendum)
-   Strict input and output.  Daily weights.  Fluid restriction.  Negative balance of 15,973.6 cc since admission.  Continue metoprolol.  Continue IV Lasix.  Monitor creatinine. -Echo showed EF of 45 to 50% with global hypokinesis.  Cardiology following.

## 2021-04-17 NOTE — Progress Notes (Signed)
Progress Note  Patient Name: [name] Date of Encounter: 04/17/2021  [facility] Cardiologist: Sanda Klein, MD   Subjective   Breathing is improving.  No chest pain.  Inpatient Medications    Scheduled Meds:  (feeding supplement) PROSource Plus  30 mL Oral BID BM   apixaban  5 mg Oral BID   vitamin C  500 mg Oral Daily   clotrimazole   Topical BID   furosemide  40 mg Intravenous Q12H   gabapentin  800 mg Oral TID   levothyroxine  125 mcg Oral Q0600   mouth rinse  15 mL Mouth Rinse BID   metoprolol succinate  50 mg Oral Daily   multivitamin with minerals  1 tablet Oral Daily   nutrition supplement (JUVEN)  1 packet Oral BID WC   Ensure Max Protein  11 oz Oral Daily   vitamin B-12  1,000 mcg Oral Daily   Continuous Infusions:  ceFEPime (MAXIPIME) IV 2 g (04/17/21 0809)   vancomycin 1,250 mg (04/17/21 0847)   PRN Meds: acetaminophen **OR** acetaminophen, methocarbamol   Vital Signs    Vitals:   04/16/21 2049 04/16/21 2250 04/17/21 0006 04/17/21 0500  BP: 106/60   123/71  Pulse: (!) 109 (!) 101  (!) 110  Resp: '20 20  20  '$ Temp: 97.9 F (36.6 C)   97.8 F (36.6 C)  TempSrc:      SpO2: 90% 94%  97%  Weight:    (!) 183.7 kg  Height:   '5\' 8"'$  (1.727 m)     Intake/Output Summary (Last 24 hours) at 04/17/2021 1046 Last data filed at 04/17/2021 0900 Gross per 24 hour  Intake 2134.25 ml  Output 4775 ml  Net -2640.75 ml   Last 3 Weights 04/17/2021 04/16/2021 04/15/2021  Weight (lbs) 405 lb 405 lb 402 lb  Weight (kg) 183.707 kg 183.707 kg 182.346 kg      Telemetry    Atrial fibrillation- Personally Reviewed  ECG    N/A  Physical Exam   GEN: Obese male in no acute distress.   Neck: JVD difficult to assess due to body habituated Cardiac: RRR, no murmurs, rubs, or gallops.  Respiratory: Clear to auscultation on anterior bilaterally  GI: Soft, nontender, non-distended  MS: + edema; No deformity. Neuro:  Nonfocal  Psych: Normal affect   Labs     High Sensitivity Troponin:   Recent Labs  Lab 04/13/21 0800 04/13/21 1215  TROPONINIHS 6 7     Chemistry Recent Labs  Lab 04/13/21 0800 04/13/21 1223 04/14/21 0528 04/15/21 0517 04/16/21 0505 04/17/21 0502  NA 141  --  139 139 140  --   K 4.0  --  4.3 3.9 4.1  --   CL 102  --  98 96* 94*  --   CO2 32  --  34* 37* 41*  --   GLUCOSE 119*  --  85 114* 111*  --   BUN 12  --  '12 12 19  '$ --   CREATININE 1.05  --  0.99 0.96 1.06 0.70  CALCIUM 9.1  --  8.9 9.1 9.5  --   MG  --  2.2  --  2.1 2.3  --   PROT 6.4*  --  6.6  --   --   --   ALBUMIN 3.0*  --  2.9*  --   --   --   AST 17  --  18  --   --   --  ALT 13  --  12  --   --   --   ALKPHOS 83  --  76  --   --   --   BILITOT 0.8  --  1.1  --   --   --   GFRNONAA >60  --  >60 >60 >60 >60  ANIONGAP 7  --  '7 6 5  '$ --     Lipids No results for input(s): CHOL, TRIG, HDL, LABVLDL, LDLCALC, CHOLHDL in the last 168 hours.  Hematology Recent Labs  Lab 04/13/21 0800 04/14/21 0528 04/15/21 0517  WBC 3.6* 4.0 3.8*  RBC 3.42* 3.49* 3.25*  HGB 10.5* 10.8* 10.0*  HCT 37.0* 38.1* 35.5*  MCV 108.2* 109.2* 109.2*  MCH 30.7 30.9 30.8  MCHC 28.4* 28.3* 28.2*  RDW 18.6* 18.5* 17.6*  PLT 212 225 191   Thyroid No results for input(s): TSH, FREET4 in the last 168 hours.  BNP Recent Labs  Lab 04/13/21 0830  BNP 228.1*    DDimer No results for input(s): DDIMER in the last 168 hours.   Radiology    ECHOCARDIOGRAM COMPLETE  Result Date: 04/15/2021    ECHOCARDIOGRAM REPORT   Patient Name:   [name] Date of Exam: 04/15/2021 Medical Rec #:  [id number]          Height:       68.0 in Accession #:    [id number]         Weight:       402.0 lb Date of Birth:  Aug 24, 1950           BSA:          2.750 m Patient Age:    39 years           BP:           108/47 mmHg Patient Gender: M                  HR:           90 bpm. Exam Location:  Inpatient Procedure: 2D Echo, Cardiac Doppler and Color Doppler Indications:    CHF  History:        Patient  has prior history of Echocardiogram examinations, most                 recent 10/11/2012.  Sonographer:    Luisa Hart RDCS Referring Phys: [id number] [facility]  Sonographer Comments: Patient is morbidly obese and Technically difficult study due to poor echo windows. Image acquisition challenging due to patient body habitus. IMPRESSIONS  1. Left ventricular ejection fraction, by estimation, is 45 to 50%. The left ventricle has mildly decreased function. The left ventricle demonstrates global hypokinesis. Left ventricular diastolic function could not be evaluated.  2. Right ventricular systolic function was not well visualized. The right ventricular size is moderately enlarged. There is moderately elevated pulmonary artery systolic pressure.  3. Left atrial size was moderately dilated.  4. Right atrial size was severely dilated.  5. The mitral valve is grossly normal. Trivial mitral valve regurgitation. No evidence of mitral stenosis.  6. The aortic valve was not well visualized. Aortic valve regurgitation is not visualized. No aortic stenosis is present.  7. Pulmonic valve regurgitation not visualized.  8. The inferior vena cava is dilated in size with <50% respiratory variability, suggesting right atrial pressure of 15 mmHg. Comparison(s): Prior images reviewed side by side. Visually studies appear similar when viewed side by side. Conclusion(s)/Recommendation(s): Technically challenging images  with limited windows, recommend echo contrast for future studies. EF visually unchanged from prior, though appears mildly abnormal on both studies. RV appears enlarged but cannot be well seen. IVC very dilated and does not collapse. FINDINGS  Left Ventricle: Left ventricular ejection fraction, by estimation, is 45 to 50%. The left ventricle has mildly decreased function. The left ventricle demonstrates global hypokinesis. The left ventricular internal cavity size was normal in size. There is  borderline left ventricular  hypertrophy. Left ventricular diastolic function could not be evaluated due to atrial fibrillation. Left ventricular diastolic function could not be evaluated. Right Ventricle: The right ventricular size is moderately enlarged. Right vetricular wall thickness was not well visualized. Right ventricular systolic function was not well visualized. There is moderately elevated pulmonary artery systolic pressure. The  tricuspid regurgitant velocity is 2.74 m/s, and with an assumed right atrial pressure of 15 mmHg, the estimated right ventricular systolic pressure is 66.5 mmHg. Left Atrium: Left atrial size was moderately dilated. Right Atrium: Right atrial size was severely dilated. Pericardium: There is no evidence of pericardial effusion. Presence of epicardial fat layer. Mitral Valve: The mitral valve is grossly normal. Trivial mitral valve regurgitation. No evidence of mitral valve stenosis. Tricuspid Valve: The tricuspid valve is grossly normal. Tricuspid valve regurgitation is trivial. No evidence of tricuspid stenosis. Aortic Valve: The aortic valve was not well visualized. Aortic valve regurgitation is not visualized. No aortic stenosis is present. Aortic valve mean gradient measures 2.7 mmHg. Aortic valve peak gradient measures 4.9 mmHg. Aortic valve area, by VTI measures 5.78 cm. Pulmonic Valve: The pulmonic valve was not well visualized. Pulmonic valve regurgitation not visualized. Aorta: The ascending aorta was not well visualized. Venous: The inferior vena cava is dilated in size with less than 50% respiratory variability, suggesting right atrial pressure of 15 mmHg. IAS/Shunts: The interatrial septum was not well visualized.  LEFT VENTRICLE PLAX 2D LVIDd:         5.20 cm LVIDs:         3.20 cm LV PW:         1.10 cm LV IVS:        1.20 cm LVOT diam:     2.80 cm LV SV:         92 LV SV Index:   34 LVOT Area:     6.16 cm  LV Volumes (MOD) LV vol d, MOD A2C: 67.3 ml LV vol d, MOD A4C: 58.7 ml LV vol s, MOD  A2C: 28.2 ml LV vol s, MOD A4C: 38.7 ml LV SV MOD A2C:     39.1 ml LV SV MOD A4C:     58.7 ml LV SV MOD BP:      29.8 ml RIGHT VENTRICLE RV Basal diam:  5.30 cm RV Mid diam:    3.60 cm LEFT ATRIUM              Index        RIGHT ATRIUM           Index LA diam:        4.90 cm  1.78 cm/m   RA Area:     56.70 cm LA Vol (A2C):   122.0 ml 44.37 ml/m  RA Volume:   267.00 ml 97.10 ml/m LA Vol (A4C):   117.0 ml 42.55 ml/m LA Biplane Vol: 124.0 ml 45.10 ml/m  AORTIC VALVE AV Area (Vmax):    5.51 cm AV Area (Vmean):   5.21 cm AV Area (VTI):  5.78 cm AV Vmax:           110.67 cm/s AV Vmean:          75.633 cm/s AV VTI:            0.160 m AV Peak Grad:      4.9 mmHg AV Mean Grad:      2.7 mmHg LVOT Vmax:         99.00 cm/s LVOT Vmean:        64.000 cm/s LVOT VTI:          0.150 m LVOT/AV VTI ratio: 0.94  AORTA Ao Root diam: 3.40 cm Ao Asc diam:  3.20 cm MR Peak grad: 48.2 mmHg    TRICUSPID VALVE MR Vmax:      347.00 cm/s  TR Peak grad:   30.0 mmHg MV E velocity: 78.93 cm/s  TR Vmax:        274.00 cm/s                             SHUNTS                            Systemic VTI:  0.15 m                            Systemic Diam: 2.80 cm Buford Dresser MD Electronically signed by Buford Dresser MD Signature Date/Time: 04/15/2021/4:16:05 PM    Final     Cardiac Studies   Echo 04/15/2021 1. Left ventricular ejection fraction, by estimation, is 45 to 50%. The  left ventricle has mildly decreased function. The left ventricle  demonstrates global hypokinesis. Left ventricular diastolic function could  not be evaluated.   2. Right ventricular systolic function was not well visualized. The right  ventricular size is moderately enlarged. There is moderately elevated  pulmonary artery systolic pressure.   3. Left atrial size was moderately dilated.   4. Right atrial size was severely dilated.   5. The mitral valve is grossly normal. Trivial mitral valve  regurgitation. No evidence of mitral stenosis.    6. The aortic valve was not well visualized. Aortic valve regurgitation  is not visualized. No aortic stenosis is present.   7. Pulmonic valve regurgitation not visualized.   8. The inferior vena cava is dilated in size with <50% respiratory  variability, suggesting right atrial pressure of 15 mmHg.   Comparison(s): Prior images reviewed side by side. Visually studies appear  similar when viewed side by side.   Patient Profile     71 y.o. male  with a hx of permanent atrial fib, (related cardiomyopathy with tachycardia, CHF) recurrent venous thromboembolic events on background of superobesity, anemia, sleep apnea on CPAP and multiple orthopedic problems (18 previous knee surgeries) which limit his ability to be physically active who is being seen 04/16/2021 after admit of edema, CHF, Hypoxia, rt groin cellulitis for the evaluation of CHF in pt with permanent atrial fib at the request of Dr. Starla Link.  Assessment & Plan    1.  Permanent atrial fibrillation -Heart rate relatively stable in 90-100s on current dose of BB.  -Continue Toprol-XL 50 mg daily >>titrate as needed -Continue Eliquis for anticoagulation  2.  Acute on chronic combined CHF -Echocardiogram with relatively stable LV function at 45 to 50% on side-to-side comparison -Diurese 11.3 L since admit -Weight  stable -Continue IV Lasix -Continue Toprol-XL 50 mg daily> could titrate beta-blocker.  Will review with addition of afterload reduction medication with MD.  3.  Chronic hypoxic respiratory failure -On home oxygen and CPAP at night  4.  Right groin cellulitis -On antibiotic treatment per primary team    For questions or updates, please contact [place] Please consult [email or web address] for contact info under        SignedLeanor Kail, PA  04/17/2021, 10:46 AM

## 2021-04-18 DIAGNOSIS — I4821 Permanent atrial fibrillation: Secondary | ICD-10-CM | POA: Diagnosis not present

## 2021-04-18 DIAGNOSIS — J9611 Chronic respiratory failure with hypoxia: Secondary | ICD-10-CM | POA: Diagnosis not present

## 2021-04-18 DIAGNOSIS — D7589 Other specified diseases of blood and blood-forming organs: Secondary | ICD-10-CM | POA: Diagnosis not present

## 2021-04-18 DIAGNOSIS — L03314 Cellulitis of groin: Secondary | ICD-10-CM | POA: Diagnosis not present

## 2021-04-18 DIAGNOSIS — R5381 Other malaise: Secondary | ICD-10-CM

## 2021-04-18 DIAGNOSIS — I5043 Acute on chronic combined systolic (congestive) and diastolic (congestive) heart failure: Secondary | ICD-10-CM | POA: Diagnosis not present

## 2021-04-18 LAB — MAGNESIUM: Magnesium: 2.3 mg/dL (ref 1.7–2.4)

## 2021-04-18 LAB — BASIC METABOLIC PANEL
Anion gap: 8 (ref 5–15)
BUN: 23 mg/dL (ref 8–23)
CO2: 39 mmol/L — ABNORMAL HIGH (ref 22–32)
Calcium: 9.5 mg/dL (ref 8.9–10.3)
Chloride: 93 mmol/L — ABNORMAL LOW (ref 98–111)
Creatinine, Ser: 0.76 mg/dL (ref 0.61–1.24)
GFR, Estimated: >60 mL/min (ref >60)
Glucose, Bld: 95 mg/dL (ref 70–99)
Potassium: 3.3 mmol/L — ABNORMAL LOW (ref 3.5–5.1)
Sodium: 140 mmol/L (ref 135–145)

## 2021-04-18 LAB — CULTURE, BLOOD (ROUTINE X 2)
Culture: NO GROWTH
Culture: NO GROWTH
Special Requests: ADEQUATE

## 2021-04-18 LAB — VANCOMYCIN, RANDOM: Vancomycin Rm: 17

## 2021-04-18 MED ORDER — AMOXICILLIN-POT CLAVULANATE 875-125 MG PO TABS
1.0000 | ORAL_TABLET | Freq: Two times a day (BID) | ORAL | Status: DC
  Administered 2021-04-18 – 2021-04-22 (×9): 1 via ORAL
  Filled 2021-04-18 (×9): qty 1

## 2021-04-18 MED ORDER — DOXYCYCLINE HYCLATE 100 MG PO TABS
100.0000 mg | ORAL_TABLET | Freq: Two times a day (BID) | ORAL | Status: DC
Start: 2021-04-18 — End: 2021-04-22
  Administered 2021-04-18 – 2021-04-22 (×9): 100 mg via ORAL
  Filled 2021-04-18 (×11): qty 1

## 2021-04-18 MED ORDER — POTASSIUM CHLORIDE CRYS ER 20 MEQ PO TBCR
40.0000 meq | EXTENDED_RELEASE_TABLET | Freq: Once | ORAL | Status: AC
  Administered 2021-04-18: 40 meq via ORAL
  Filled 2021-04-18: qty 2

## 2021-04-18 NOTE — Progress Notes (Signed)
Physical Therapy Treatment ?Patient Details ?Name: [name] ?MRN: [id number] ?DOB: 07/29/1950 ?Today's Date: 04/18/2021 ? ? ?History of Present Illness [name] is a 71 y.o. male presents with increased edema to his perineum and lower abdomen. CT of abdomen/pelvis showed subcutaneous edema in the right inguinal region/perineum with mild skin thickening but no drainable fluid collection or abscess along with scrotal wall thickening with possible hydrocele. 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 ?  ?PT Comments  ? ? Patient overall with depressed demeanor but very willing to work with therapy. He attempted to initiate reaching UE's and moving Rt LE off EOB. Pt required Max assist +2 to fully sit up to EOB and blocking from therapist for safety to prevent pt from sliding anteriorly due poor balance secondary to Lt AKA. Patient attempted to halfway stand 3x from EOB with blocking on Lt side,bil UE support on recliner back, and Max+2 assist to power up. Pt unable to fully stand but ws able to raise buttocks from EOB slightly. Patient assisted back to supine and repositioned. Pt has expressed concerns regarding care at [facility], he would like to try to go to another facility if possible. Overall pt has limited rehab potential but could progress to stand pivot transfer with ongoing skilled PT services. He will likely need LTC after ST rehab to address impairments and progress mobility as able. ? ?  ?Recommendations for follow up therapy are one component of a multi-disciplinary discharge planning process, led by the attending physician.  Recommendations may be updated based on patient status, additional functional criteria and insurance authorization. ? ?Follow Up Recommendations ? Skilled nursing-short term rehab (<3 hours/day) ?  ?  ?Assistance Recommended at Discharge Frequent or constant Supervision/Assistance  ?Patient can return home with the following Two people  to help with walking and/or transfers;Two people to help with bathing/dressing/bathroom;Assistance with cooking/housework ?  ?Equipment Recommendations ? None recommended by PT  ?  ?Recommendations for Other Services   ? ? ?  ?Precautions / Restrictions Precautions ?Precautions: Fall ?Restrictions ?Weight Bearing Restrictions: No  ?  ? ?Mobility ? Bed Mobility ?Overal bed mobility: Needs Assistance ?Bed Mobility: Rolling, Sidelying to Sit, Sit to Supine ?Rolling: Mod assist, Max assist, +2 for physical assistance, +2 for safety/equipment ?Sidelying to sit: Mod assist, Max assist, +2 for physical assistance, +2 for safety/equipment, HOB elevated ?  ?Sit to supine: Total assist, +2 for physical assistance, +2 for safety/equipment ?  ?General bed mobility comments: Assist to initiate reaching to bed rail, pt attempted to initiate moving Rt LE to EOB. pt required ?  ? ?Transfers ?Overall transfer level: Needs assistance ?Equipment used:  (recliner chair back) ?Transfers: Sit to/from Stand ?Sit to Stand: Total assist, From elevated surface ?  ?  ?  ?  ?  ?General transfer comment: attempted sit<>stand with recliner for support. pt able to pull up slightly and press through Rt LE to initiate power up. attempted 3x with incresaed hip clearance from EOB on each attempt but pt never fully cleared buttocks from surface of bed. ?  ? ?Ambulation/Gait ?  ?  ?  ?  ?  ?  ?  ?  ? ? ?Stairs ?  ?  ?  ?  ?  ? ? ?Wheelchair Mobility ?  ? ?Modified Rankin (Stroke Patients Only) ?  ? ? ?  ?Balance Overall balance assessment: Needs assistance ?Sitting-balance support: Feet supported, Bilateral upper extremity supported ?Sitting balance-Leahy Scale: Fair ?  ?  ?  ?  Standing balance-Leahy Scale: Zero ?Standing balance comment: unsafe/unable to fully stand ?  ?  ?  ?  ?  ?  ?  ?  ?  ?  ?  ?  ? ?  ?Cognition Arousal/Alertness: Awake/alert ?Behavior During Therapy: [facility] for tasks assessed/performed ?Overall Cognitive Status: No family/caregiver  present to determine baseline cognitive functioning ?  ?  ?  ?  ?  ?  ?  ?  ?  ?  ?  ?  ?  ?  ?  ?  ?General Comments: pt oriented to self, place, and date. pt depressed and dissapointed in current mobility/functional level and quality of life. ?  ?  ? ?  ?Exercises   ? ?  ?General Comments   ?  ?  ? ?Pertinent Vitals/Pain Pain Assessment ?Pain Assessment: No/denies pain  ? ? ?Home Living Family/patient expects to be discharged to:: Skilled nursing facility ?  ?  ?  ?  ?  ?  ?  ?  ?  ?Additional Comments: pt reports his sister Jenny Reichmann is in [place] and is working on his care  but its the famil in [place] that would actually help him if needed. He doesn't think he could go to and ALF out there.  ?  ?Prior Function    ?  ?  ?   ? ?PT Goals (current goals can now be found in the care plan section) Acute Rehab PT Goals ?PT Goal Formulation: With patient ?Time For Goal Achievement: 04/28/21 ?Potential to Achieve Goals: Fair ?Progress towards PT goals: Progressing toward goals ? ?  ?Frequency ? ? ? Min 2X/week ? ? ? ?  ?PT Plan Current plan remains appropriate  ? ? ?Co-evaluation   ?  ?  ?  ?  ? ?  ?AM-PAC PT "6 Clicks" Mobility   ?Outcome Measure ? Help needed turning from your back to your side while in a flat bed without using bedrails?: Total ?Help needed moving from lying on your back to sitting on the side of a flat bed without using bedrails?: Total ?Help needed moving to and from a bed to a chair (including a wheelchair)?: Total ?Help needed standing up from a chair using your arms (e.g., wheelchair or bedside chair)?: Total ?Help needed to walk in hospital room?: Total ?Help needed climbing 3-5 steps with a railing? : Total ?6 Click Score: 6 ? ?  ?End of Session Equipment Utilized During Treatment: Oxygen ?Activity Tolerance: Patient tolerated treatment well ?Patient left: in bed;with call bell/phone within reach ?Nurse Communication: Mobility status ?PT Visit Diagnosis: Other abnormalities of gait and mobility  (R26.89);Muscle weakness (generalized) (M62.81) ?  ? ? ?Time: 1024-1101 ?PT Time Calculation (min) (ACUTE ONLY): 37 min ? ?Charges:  $Therapeutic Activity: 23-37 mins          ?          ? ?Gwynneth Albright PT, DPT ?Acute Rehabilitation Services ?Office [phone] ?Pager [phone]  ? ? ?Jacques Navy ?04/18/2021, 1:09 PM ? ?

## 2021-04-18 NOTE — TOC Progression Note (Signed)
Transition of Care (TOC) - Progression Note  ? ? ?Patient Details  ?Name: [name] ?MRN: [id number] ?Date of Birth: 08/07/1950 ? ?Transition of Care (TOC) CM/SW Contact  ?Ross Ludwig, LCSW ?Phone Number: ?04/18/2021, 5:58 PM ? ?Clinical Narrative:    ? ?CSW sent updated clinicals to SNFs, awaiting bed offers, patient will need insurance authorization.  Due to patient's weight he will be limited on where he is able to go to a SNF.  CSW to continue to follow patient's progress throughout discharge planning. ? ? ?Expected Discharge Plan: [place] ?Barriers to Discharge: Other (must enter comment) (pending cardiology sign off, insurance authorization) ? ?Expected Discharge Plan and Services ?Expected Discharge Plan: [place] ?  ?Discharge Planning Services: CM Consult ?Post Acute Care Choice: [place] ?Living arrangements for the past 2 months: [place] ?                ?  ?  ?  ?  ?  ?  ?  ?  ?  ?  ? ? ?Social Determinants of Health (SDOH) Interventions ?  ? ?Readmission Risk Interventions ?Readmission Risk Prevention Plan 11/09/2019  ?Transportation Screening Complete  ?PCP or Specialist Appt within 5-7 Days Complete  ?Home Care Screening Complete  ?Medication Review (RN CM) Complete  ?Some recent data might be hidden  ? ? ?

## 2021-04-18 NOTE — Progress Notes (Signed)
? ?Progress Note ? ?Patient Name: [name] ?Date of Encounter: 04/18/2021 ? ?[facility] Cardiologist: Sanda Klein, MD  ? ?Subjective  ? ?Didn't sleep well as he was cold overnight--adjusted thermostat this AM. Thinks breathing is improving. Had significant urine output yesterday. ? ?Inpatient Medications  ?  ?Scheduled Meds: ? (feeding supplement) PROSource Plus  30 mL Oral BID BM  ? amoxicillin-clavulanate  1 tablet Oral Q12H  ? apixaban  5 mg Oral BID  ? vitamin C  500 mg Oral Daily  ? clotrimazole   Topical BID  ? doxycycline  100 mg Oral Q12H  ? furosemide  40 mg Intravenous Q12H  ? gabapentin  800 mg Oral TID  ? levothyroxine  125 mcg Oral Q0600  ? mouth rinse  15 mL Mouth Rinse BID  ? metoprolol succinate  100 mg Oral Daily  ? multivitamin with minerals  1 tablet Oral Daily  ? nutrition supplement (JUVEN)  1 packet Oral BID WC  ? potassium chloride  40 mEq Oral Once  ? Ensure Max Protein  11 oz Oral Daily  ? vitamin B-12  1,000 mcg Oral Daily  ? ?Continuous Infusions: ? ? ?PRN Meds: ?acetaminophen **OR** acetaminophen, methocarbamol  ? ?Vital Signs  ?  ?Vitals:  ? 04/17/21 0500 04/17/21 1332 04/17/21 1959 04/18/21 0449  ?BP: 123/71 121/70 118/62 123/82  ?Pulse: (!) 110 (!) 103 93 90  ?Resp: '20 20 20 20  '$ ?Temp: 97.8 ?F (36.6 ?C) 98.6 ?F (37 ?C) 97.8 ?F (36.6 ?C) (!) 97.5 ?F (36.4 ?C)  ?TempSrc:  Oral Oral Oral  ?SpO2: 97% 95% 95% 94%  ?Weight: (!) 183.7 kg     ?Height:      ? ? ?Intake/Output Summary (Last 24 hours) at 04/18/2021 1017 ?Last data filed at 04/18/2021 0900 ?Gross per 24 hour  ?Intake 1330 ml  ?Output 5950 ml  ?Net -4620 ml  ? ?Last 3 Weights 04/17/2021 04/16/2021 04/15/2021  ?Weight (lbs) 405 lb 405 lb 402 lb  ?Weight (kg) 183.707 kg 183.707 kg 182.346 kg  ?   ? ?Telemetry  ?  ?Atrial fibrillation - Personally Reviewed ? ?ECG  ?  ?No new since 04/13/21 - Personally Reviewed ? ?Physical Exam  ? ?GEN: No acute distress.   ?Neck: No JVD appreciated 2/2 body habitus ?Cardiac: RRR, no murmurs,  rubs, or gallops.  ?Respiratory: Breath sounds are distant but clear ?GI: Soft, nontender, non-distended  ?MS: No edema; s/p L AKA, RLE with chronic venous stasis changes/brawny edema ?Neuro:  Nonfocal  ?Psych: Normal affect  ? ?Labs  ?  ?High Sensitivity Troponin:   ?Recent Labs  ?Lab 04/13/21 ?0800 04/13/21 ?1215  ?TROPONINIHS 6 7  ?   ?Chemistry ?Recent Labs  ?Lab 04/13/21 ?0800 04/13/21 ?1223 04/14/21 ?9924 04/15/21 ?2683 04/16/21 ?0505 04/17/21 ?0502 04/18/21 ?4196  ?NA 141  --  139 139 140  --  140  ?K 4.0  --  4.3 3.9 4.1  --  3.3*  ?CL 102  --  98 96* 94*  --  93*  ?CO2 32  --  34* 37* 41*  --  39*  ?GLUCOSE 119*  --  85 114* 111*  --  95  ?BUN 12  --  '12 12 19  '$ --  23  ?CREATININE 1.05  --  0.99 0.96 1.06 0.70 0.76  ?CALCIUM 9.1  --  8.9 9.1 9.5  --  9.5  ?MG  --    < >  --  2.1 2.3  --  2.3  ?PROT 6.4*  --  6.6  --   --   --   --   ?ALBUMIN 3.0*  --  2.9*  --   --   --   --   ?AST 17  --  18  --   --   --   --   ?ALT 13  --  12  --   --   --   --   ?ALKPHOS 83  --  76  --   --   --   --   ?BILITOT 0.8  --  1.1  --   --   --   --   ?GFRNONAA >60  --  >60 >60 >60 >60 >60  ?ANIONGAP 7  --  '7 6 5  '$ --  8  ? < > = values in this interval not displayed.  ?  ?Lipids No results for input(s): CHOL, TRIG, HDL, LABVLDL, LDLCALC, CHOLHDL in the last 168 hours.  ?Hematology ?Recent Labs  ?Lab 04/13/21 ?0800 04/14/21 ?4431 04/15/21 ?5400  ?WBC 3.6* 4.0 3.8*  ?RBC 3.42* 3.49* 3.25*  ?HGB 10.5* 10.8* 10.0*  ?HCT 37.0* 38.1* 35.5*  ?MCV 108.2* 109.2* 109.2*  ?MCH 30.7 30.9 30.8  ?MCHC 28.4* 28.3* 28.2*  ?RDW 18.6* 18.5* 17.6*  ?PLT 212 225 191  ? ?Thyroid No results for input(s): TSH, FREET4 in the last 168 hours.  ?BNP ?Recent Labs  ?Lab 04/13/21 ?0830  ?BNP 228.1*  ?  ?DDimer No results for input(s): DDIMER in the last 168 hours.  ? ?Radiology  ?  ?No results found. ? ?Cardiac Studies  ? ?Echo 04/15/21 ?1. Left ventricular ejection fraction, by estimation, is 45 to 50%. The  ?left ventricle has mildly decreased function.  The left ventricle  ?demonstrates global hypokinesis. Left ventricular diastolic function could  ?not be evaluated.  ? 2. Right ventricular systolic function was not well visualized. The right  ?ventricular size is moderately enlarged. There is moderately elevated  ?pulmonary artery systolic pressure.  ? 3. Left atrial size was moderately dilated.  ? 4. Right atrial size was severely dilated.  ? 5. The mitral valve is grossly normal. Trivial mitral valve  ?regurgitation. No evidence of mitral stenosis.  ? 6. The aortic valve was not well visualized. Aortic valve regurgitation  ?is not visualized. No aortic stenosis is present.  ? 7. Pulmonic valve regurgitation not visualized.  ? 8. The inferior vena cava is dilated in size with <50% respiratory  ?variability, suggesting right atrial pressure of 15 mmHg.  ? ?Comparison(s): Prior images reviewed side by side. Visually studies appear  ?similar when viewed side by side.  ? ?Patient Profile  ?   ?71 y.o. male with a hx of permanent atrial fib, recurrent venous thromboembolic events on background of superobesity, anemia, sleep apnea on CPAP and multiple orthopedic problems (18 previous knee surgeries) which limit his ability to be physically active who is being followed for the evaluation of CHF at the request of Dr. Starla Link. ? ?Assessment & Plan  ?  ?Acute on chronic systolic and diastolic heart failure ?-I personally read his most recent echo. Visually EF appears similar to prior, mildly reduced at 45-50% ?-volume status is extremely difficult to assess by exam. While I/O report net negative 11 L, his weight is largely unchanged. ?-reported as >7L out yesterday.  ?-BMET is stable, Cr improving. Once BMET suggests volume contraction, then will change to oral ?-increased metoprolol as below ?-once he is thought to be  euvolemic, if blood pressure room and renal function allows, would consider ACEi/ARB/ARNI. Would use MRA or SGLT2i if additional room remains after  that ?-hypokalemic at 3.3, has supplementation ordered ?-I would anticipate at least one more day of IV diuretic as he is still putting out high volume urine with improvements in his renal function. Will see if tomorrow he is ready to transition to oral, but will use labs as guide, as above. ?  ?Permanent atrial fibrillation ?-increased metoprolol succinate this admission to 100 mg daily. Has not yet received this AM, may need to increase further if rates remain 90s-100s. ?  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  ?Cellulitis: per primary team ? ?For questions or updates, please contact [place] ?Please consult [email or web address] for contact info under  ?   ?Signed, ?Buford Dresser, MD  ?04/18/2021, 10:17 AM    ?

## 2021-04-18 NOTE — Plan of Care (Signed)
  Problem: Education: Goal: Knowledge of General Education information will improve Description: Including pain rating scale, medication(s)/side effects and non-pharmacologic comfort measures Outcome: Progressing   Problem: Coping: Goal: Level of anxiety will decrease Outcome: Progressing   Problem: Safety: Goal: Ability to remain free from injury will improve Outcome: Progressing   

## 2021-04-18 NOTE — Progress Notes (Addendum)
?Progress Note ? ? ?Patient: [name] YKD:[id number] DOB: [birth year] DOA: 04/13/2021     5 ?DOS: the patient was seen and examined on 04/18/2021 ?  ?Brief hospital course: ?71 y.o. male with medical history significant of pAfib, Hx of DVT, chronic diastolic HF, hypothyroidism, morbid obesity, chronic hypoxic respiratory failure on 4L [place], intermittent scrotal and groin swelling for several months requiring multiple ED visits presented with increased shortness of breath along with groin swelling and redness.  On presentation, BNP 228.1; chest x-ray showed no signs of pulmonary edema or focal pulmonary consolidation.  CT of abdomen/pelvis showed subcutaneous edema in the right inguinal region/perineum with mild skin thickening but no drainable fluid collection or abscess along with scrotal wall thickening with possible hydrocele.  Urology was consulted.  Echo showed EF of 45 to 50%.  Cardiology was consulted on 04/16/2021. ? ?Assessment and Plan: ?Cellulitis of groin, right ?Scrotal cellulitis ?-CT imaging as above.  Scrotal ultrasound also showed mild scrotal edema along with possible complex hydroceles. ?-Currently on broad-spectrum antibiotics.  Switch antibiotics to Augmentin and doxycycline. ?-Urology recommended conservative management with antibiotics; scrotal elevation and to continue Lotrimin cream to prevent/treat fungal infection. ? ?Acute on chronic combined systolic and diastolic CHF (congestive heart failure) [facility]): Patient on admission ?- Strict input and output.  Daily weights.  Fluid restriction.  Negative balance of 15,973.6 cc since admission.  Continue metoprolol.  Continue IV Lasix.  Monitor creatinine. ?-Echo showed EF of 45 to 50% with global hypokinesis.  Cardiology following. ? ?Chronic respiratory failure with hypoxia ([place]) ?OSA ?-Normally uses 3 to 4 L oxygen via nasal cannula during daytime and CPAP at night.  Continue same. ? ?Hypothyroidism ?- Continue Synthroid ? ?Persistent atrial  fibrillation ([place]) ?History of DVT ?-Continue metoprolol and Eliquis ? ?Morbid obesity with BMI of 60.0-69.9, adult ([place]) ?- Outpatient follow-up ? ?Physical deconditioning ?- PT recommends SNF placement.  Social worker following. ? ?Anemia of chronic disease ?Macrocytosis ?-Possibly from chronic illnesses.  Hemoglobin stable.  Monitor. ? ?Pressure injury of skin ?Stage II pressure injury right posterior thigh: Present on admission ?-Continue local wound care ? ?Subjective:  ?Patient seen and examined at bedside.  Poor historian.  Denies chest pain, fever or vomiting. ? ?Physical Exam: ?Vitals:  ? 04/17/21 0500 04/17/21 1332 04/17/21 1959 04/18/21 0449  ?BP: 123/71 121/70 118/62 123/82  ?Pulse: (!) 110 (!) 103 93 90  ?Resp: '20 20 20 20  '$ ?Temp: 97.8 ?F (36.6 ?C) 98.6 ?F (37 ?C) 97.8 ?F (36.6 ?C) (!) 97.5 ?F (36.4 ?C)  ?TempSrc:  Oral Oral Oral  ?SpO2: 97% 95% 95% 94%  ?Weight: (!) 183.7 kg     ?Height:      ? ?General: No acute distress, currently on 4 L oxygen via nasal cannula.  Chronically ill and deconditioned looking.   ?ENT/neck: No elevated JVD.  No obvious masses  ?respiratory: Bilateral decreased breath sounds at bases with some scattered crackles, no wheezing.  Intermittently tachypneic ?CVS: S1-S2 heard, rate controlled currently ?Abdominal: Soft, morbidly obese, nontender, nondistended, no organomegaly, bowel sounds heard ?Extremities: No cyanosis, clubbing, edema ?CNS: Alert; poor historian; slow to respond.  No focal neurologic deficit.  Moving extremities. ?Lymph: No cervical lymphadenopathy ?Skin: No obvious petechiae/ulcers ?Psych: Affect is mostly flat and hardly participates in conversation  ?musculoskeletal: No obvious joint deformity/tenderness/swelling ?Genitourinary: Scrotal erythema and tenderness improving ? ?Data Reviewed: ?I have reviewed patient's investigations during this hospitalization myself.  Today, sodium 140, potassium 3.3, creatinine 0.76, magnesium 2.3 ? ?Family Communication:  None at bedside ?  ?Disposition: ?Status is: Inpatient ?Remains inpatient appropriate because: Of need for IV antibiotics and Lasix ? Planned Discharge Destination: Skilled nursing facility in 1 to 2 days once clinically improved and cleared by cardiology.  Social worker following regarding the same. ? ? ? ?Author: ?Aline August, MD ?04/18/2021 8:05 AM ? ?For on call review [email or web address].  ?

## 2021-04-19 DIAGNOSIS — I5043 Acute on chronic combined systolic (congestive) and diastolic (congestive) heart failure: Secondary | ICD-10-CM | POA: Diagnosis not present

## 2021-04-19 DIAGNOSIS — D638 Anemia in other chronic diseases classified elsewhere: Secondary | ICD-10-CM | POA: Diagnosis not present

## 2021-04-19 DIAGNOSIS — J9611 Chronic respiratory failure with hypoxia: Secondary | ICD-10-CM | POA: Diagnosis not present

## 2021-04-19 DIAGNOSIS — L03314 Cellulitis of groin: Secondary | ICD-10-CM | POA: Diagnosis not present

## 2021-04-19 LAB — BASIC METABOLIC PANEL
Anion gap: 8 (ref 5–15)
BUN: 21 mg/dL (ref 8–23)
CO2: 40 mmol/L — ABNORMAL HIGH (ref 22–32)
Calcium: 9.9 mg/dL (ref 8.9–10.3)
Chloride: 93 mmol/L — ABNORMAL LOW (ref 98–111)
Creatinine, Ser: 0.7 mg/dL (ref 0.61–1.24)
GFR, Estimated: >60 mL/min (ref >60)
Glucose, Bld: 99 mg/dL (ref 70–99)
Potassium: 3.6 mmol/L (ref 3.5–5.1)
Sodium: 141 mmol/L (ref 135–145)

## 2021-04-19 LAB — MAGNESIUM: Magnesium: 2.5 mg/dL — ABNORMAL HIGH (ref 1.7–2.4)

## 2021-04-19 MED ORDER — LOSARTAN POTASSIUM 50 MG PO TABS
25.0000 mg | ORAL_TABLET | Freq: Every day | ORAL | Status: DC
Start: 2021-04-19 — End: 2021-05-07
  Administered 2021-04-19 – 2021-05-07 (×18): 25 mg via ORAL
  Filled 2021-04-19 (×19): qty 1

## 2021-04-19 MED ORDER — TORSEMIDE 20 MG PO TABS
20.0000 mg | ORAL_TABLET | Freq: Every day | ORAL | Status: DC
  Administered 2021-04-19 – 2021-05-07 (×19): 20 mg via ORAL
  Filled 2021-04-19 (×19): qty 1

## 2021-04-19 NOTE — Progress Notes (Signed)
? ?Progress Note ? ?Patient Name: [name] ?Date of Encounter: 04/19/2021 ? ?[facility] Cardiologist: Sanda Klein, MD  ? ?Subjective  ? ?No acute events overnight. Sleeping comfortably this AM. No new concerns. ? ?Inpatient Medications  ?  ?Scheduled Meds: ? (feeding supplement) PROSource Plus  30 mL Oral BID BM  ? amoxicillin-clavulanate  1 tablet Oral Q12H  ? apixaban  5 mg Oral BID  ? vitamin C  500 mg Oral Daily  ? clotrimazole   Topical BID  ? doxycycline  100 mg Oral Q12H  ? gabapentin  800 mg Oral TID  ? levothyroxine  125 mcg Oral Q0600  ? losartan  25 mg Oral Daily  ? mouth rinse  15 mL Mouth Rinse BID  ? metoprolol succinate  100 mg Oral Daily  ? multivitamin with minerals  1 tablet Oral Daily  ? nutrition supplement (JUVEN)  1 packet Oral BID WC  ? Ensure Max Protein  11 oz Oral Daily  ? torsemide  20 mg Oral Daily  ? vitamin B-12  1,000 mcg Oral Daily  ? ?Continuous Infusions: ? ? ?PRN Meds: ?acetaminophen **OR** acetaminophen, methocarbamol  ? ?Vital Signs  ?  ?Vitals:  ? 04/18/21 0449 04/18/21 1347 04/18/21 2058 04/19/21 0865  ?BP: 123/82 135/83 119/79 (!) 143/87  ?Pulse: 90 97 89 84  ?Resp: '20 16 20 16  '$ ?Temp: (!) 97.5 ?F (36.4 ?C) 97.7 ?F (36.5 ?C) 98 ?F (36.7 ?C) 98 ?F (36.7 ?C)  ?TempSrc: Oral Oral Oral Tympanic  ?SpO2: 94% 95% 94% 96%  ?Weight:      ?Height:      ? ? ?Intake/Output Summary (Last 24 hours) at 04/19/2021 0949 ?Last data filed at 04/19/2021 0500 ?Gross per 24 hour  ?Intake 576 ml  ?Output 2700 ml  ?Net -2124 ml  ? ?Last 3 Weights 04/19/2021 04/17/2021 04/16/2021  ?Weight (lbs) (No Data) 405 lb 405 lb  ?Weight (kg) (No Data) 183.707 kg 183.707 kg  ?   ? ?Telemetry  ?  ?Atrial fibrillation - Personally Reviewed ? ?ECG  ?  ?No new since 04/13/21 - Personally Reviewed ? ?Physical Exam  ? ?GEN: Well nourished, well developed in no acute distress ?NECK: No JVD appreciated 2/2 difficult body habitus ?CARDIAC: regular rhythm, normal S1 and S2, no rubs or gallops. No  murmur. ?VASCULAR: Radial pulses 2+ bilaterally.  ?RESPIRATORY:  distant breath sounds but clear ?ABDOMEN: Soft, non-tender, non-distended ?MUSCULOSKELETAL:  s/p L AKA ?SKIN: Warm and dry, improved edema with chronic venous changes RLE ?NEUROLOGIC:  No focal neuro deficits noted. ?PSYCHIATRIC:  Normal affect   ? ?Labs  ?  ?High Sensitivity Troponin:   ?Recent Labs  ?Lab 04/13/21 ?0800 04/13/21 ?1215  ?TROPONINIHS 6 7  ?   ?Chemistry ?Recent Labs  ?Lab 04/13/21 ?0800 04/13/21 ?1223 04/14/21 ?7846 04/15/21 ?9629 04/16/21 ?0505 04/17/21 ?0502 04/18/21 ?5284 04/19/21 ?0552  ?NA 141  --  139   < > 140  --  140 141  ?K 4.0  --  4.3   < > 4.1  --  3.3* 3.6  ?CL 102  --  98   < > 94*  --  93* 93*  ?CO2 32  --  34*   < > 41*  --  39* 40*  ?GLUCOSE 119*  --  85   < > 111*  --  95 99  ?BUN 12  --  12   < > 19  --  23 21  ?CREATININE 1.05  --  0.99   < > 1.06 0.70 0.76 0.70  ?CALCIUM 9.1  --  8.9   < > 9.5  --  9.5 9.9  ?MG  --    < >  --    < > 2.3  --  2.3 2.5*  ?PROT 6.4*  --  6.6  --   --   --   --   --   ?ALBUMIN 3.0*  --  2.9*  --   --   --   --   --   ?AST 17  --  18  --   --   --   --   --   ?ALT 13  --  12  --   --   --   --   --   ?ALKPHOS 83  --  76  --   --   --   --   --   ?BILITOT 0.8  --  1.1  --   --   --   --   --   ?GFRNONAA >60  --  >60   < > >60 >60 >60 >60  ?ANIONGAP 7  --  7   < > 5  --  8 8  ? < > = values in this interval not displayed.  ?  ?Lipids No results for input(s): CHOL, TRIG, HDL, LABVLDL, LDLCALC, CHOLHDL in the last 168 hours.  ?Hematology ?Recent Labs  ?Lab 04/13/21 ?0800 04/14/21 ?1245 04/15/21 ?8099  ?WBC 3.6* 4.0 3.8*  ?RBC 3.42* 3.49* 3.25*  ?HGB 10.5* 10.8* 10.0*  ?HCT 37.0* 38.1* 35.5*  ?MCV 108.2* 109.2* 109.2*  ?MCH 30.7 30.9 30.8  ?MCHC 28.4* 28.3* 28.2*  ?RDW 18.6* 18.5* 17.6*  ?PLT 212 225 191  ? ?Thyroid No results for input(s): TSH, FREET4 in the last 168 hours.  ?BNP ?Recent Labs  ?Lab 04/13/21 ?0830  ?BNP 228.1*  ?  ?DDimer No results for input(s): DDIMER in the last 168 hours.   ? ?Radiology  ?  ?No results found. ? ?Cardiac Studies  ? ?Echo 04/15/21 ?1. Left ventricular ejection fraction, by estimation, is 45 to 50%. The  ?left ventricle has mildly decreased function. The left ventricle  ?demonstrates global hypokinesis. Left ventricular diastolic function could  ?not be evaluated.  ? 2. Right ventricular systolic function was not well visualized. The right  ?ventricular size is moderately enlarged. There is moderately elevated  ?pulmonary artery systolic pressure.  ? 3. Left atrial size was moderately dilated.  ? 4. Right atrial size was severely dilated.  ? 5. The mitral valve is grossly normal. Trivial mitral valve  ?regurgitation. No evidence of mitral stenosis.  ? 6. The aortic valve was not well visualized. Aortic valve regurgitation  ?is not visualized. No aortic stenosis is present.  ? 7. Pulmonic valve regurgitation not visualized.  ? 8. The inferior vena cava is dilated in size with <50% respiratory  ?variability, suggesting right atrial pressure of 15 mmHg.  ? ?Comparison(s): Prior images reviewed side by side. Visually studies appear  ?similar when viewed side by side.  ? ?Patient Profile  ?   ?71 y.o. male with a hx of permanent atrial fib, recurrent venous thromboembolic events on background of superobesity, anemia, sleep apnea on CPAP and multiple orthopedic problems (18 previous knee surgeries) which limit his ability to be physically active who is being followed for the evaluation of CHF at the request of Dr. Starla Link. ? ?Assessment & Plan  ?  ?Acute on chronic systolic  and diastolic heart failure ?-I personally read his most recent echo. Visually EF appears similar to prior, mildly reduced at 45-50% ?-volume status is extremely difficult to assess by exam. While I/O report net negative 17 L, his weight is largely unchanged. Intake not well charted ?-Cr improving. CO2 up to 40 today, BUN 21. Will change to oral diuretic today ?-increased metoprolol as below ?-BP normal to  slightly elevated today. Will start low dose ARB to see if he tolerates. Would use MRA or SGLT2i if additional room remains after that ? ?Permanent atrial fibrillation ?-increased metoprolol succinate this admission to 100 mg daily. May need to increase further if rates remain 90s-100s. ?  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  ?Cellulitis: per primary team ? ?If he does well on oral medications, he should be approaching discharge in the near future from a cardiac perspective. ? ?For questions or updates, please contact [place] ?Please consult [email or web address] for contact info under  ?   ?Signed, ?Buford Dresser, MD  ?04/19/2021, 9:49 AM    ?

## 2021-04-19 NOTE — Progress Notes (Signed)
?Progress Note ? ? ?Patient: [name] INO:[id number] DOB: [birth year] DOA: 04/13/2021     6 ?DOS: the patient was seen and examined on 04/19/2021 ?  ?Brief hospital course: ?71 y.o. male with medical history significant of pAfib, Hx of DVT, chronic diastolic HF, hypothyroidism, morbid obesity, chronic hypoxic respiratory failure on 4L [place], intermittent scrotal and groin swelling for several months requiring multiple ED visits presented with increased shortness of breath along with groin swelling and redness.  On presentation, BNP 228.1; chest x-ray showed no signs of pulmonary edema or focal pulmonary consolidation.  CT of abdomen/pelvis showed subcutaneous edema in the right inguinal region/perineum with mild skin thickening but no drainable fluid collection or abscess along with scrotal wall thickening with possible hydrocele.  Urology was consulted.  Echo showed EF of 45 to 50%.  Cardiology was consulted on 04/16/2021.  PT recommended SNF placement ? ?Assessment and Plan: ?Cellulitis of groin, right ?Scrotal cellulitis ?-CT imaging as above.  Scrotal ultrasound also showed mild scrotal edema along with possible complex hydroceles. ?-Currently on broad-spectrum antibiotics.  Switched antibiotics to Augmentin and doxycycline; finish 10-day course of therapy. ?-Urology recommended conservative management with antibiotics; scrotal elevation and to continue Lotrimin cream to prevent/treat fungal infection. ? ?Acute on chronic combined systolic and diastolic CHF (congestive heart failure) [facility]): Patient on admission ?- Strict input and output.  Daily weights.  Fluid restriction.  [place] 17757.6 cc since admission.  Continue metoprolol.  Continue IV Lasix.  Monitor creatinine. ?-Echo showed EF of 45 to 50% with global hypokinesis.  Cardiology following. ? ?Hypokalemia ?-Improved ? ?Chronic respiratory failure with hypoxia ([facility]) ?OSA ?-Normally uses 3 to 4 L oxygen via nasal cannula during daytime and CPAP  at night.  Continue same. ? ?Hypothyroidism ?- Continue Synthroid ? ?Persistent atrial fibrillation ([place]) ?History of DVT ?-Continue metoprolol and Eliquis ? ?Morbid obesity with BMI of 60.0-69.9, adult ([place]) ?- Outpatient follow-up ? ?Physical deconditioning ?- PT recommends SNF placement.  Social worker following. ? ?Anemia of chronic disease ?Macrocytosis ?-Possibly from chronic illnesses.  Hemoglobin stable.  Monitor. ? ?Pressure injury of skin ?Stage II pressure injury right posterior thigh: Present on admission ?-Continue local wound care ? ?Subjective:  ?Patient seen and examined at bedside.  Poor historian.  Still short of breath with minimal exertion.  No overnight agitation, fever or chest pain reported. ?Physical Exam: ?Vitals:  ? 04/18/21 0449 04/18/21 1347 04/18/21 2058 04/19/21 0635  ?BP: 123/82 135/83 119/79 (!) 143/87  ?Pulse: 90 97 89 84  ?Resp: '20 16 20 16  '$ ?Temp: (!) 97.5 ?F (36.4 ?C) 97.7 ?F (36.5 ?C) 98 ?F (36.7 ?C) 98 ?F (36.7 ?C)  ?TempSrc: Oral Oral Oral Tympanic  ?SpO2: 94% 95% 94% 96%  ?Weight:      ?Height:      ? ?General: Currently on CPAP.  No distress.  Looks chronically ill and deconditioned. ?respiratory: Decreased breath sounds at bases bilaterally with some crackles ?CVS: Currently rate controlled; S1-S2 heard  ?abdominal: Soft, morbidly obese, nontender, slightly distended, no organomegaly; normal bowel sounds are heard  ?extremities: Trace right lower extremity edema; no clubbing.  Left AKA present ? ? ?Data Reviewed: ?I have reviewed patient's investigations during this hospitalization myself.  Today, sodium 141, potassium 3.6, chloride 93, bicarb 40, creatinine 0.7, magnesium 2.5 ?Family Communication: None at bedside ?  ?Disposition: ?Status is: Inpatient ?Remains inpatient appropriate because: Of need for IV Lasix ? Planned Discharge Destination: Skilled nursing facility in 1 to 2 days once clinically  improved and cleared by cardiology.  Social worker following regarding the  same. ? ? ? ? ?Author: ?Aline August, MD ?04/19/2021 7:54 AM ? ?For on call review [email or web address]  ?

## 2021-04-20 DIAGNOSIS — I5043 Acute on chronic combined systolic (congestive) and diastolic (congestive) heart failure: Secondary | ICD-10-CM | POA: Diagnosis not present

## 2021-04-20 DIAGNOSIS — L03314 Cellulitis of groin: Secondary | ICD-10-CM | POA: Diagnosis not present

## 2021-04-20 DIAGNOSIS — J9611 Chronic respiratory failure with hypoxia: Secondary | ICD-10-CM | POA: Diagnosis not present

## 2021-04-20 DIAGNOSIS — D638 Anemia in other chronic diseases classified elsewhere: Secondary | ICD-10-CM | POA: Diagnosis not present

## 2021-04-20 DIAGNOSIS — G4733 Obstructive sleep apnea (adult) (pediatric): Secondary | ICD-10-CM | POA: Diagnosis not present

## 2021-04-20 LAB — BASIC METABOLIC PANEL
Anion gap: 9 (ref 5–15)
BUN: 29 mg/dL — ABNORMAL HIGH (ref 8–23)
CO2: 39 mmol/L — ABNORMAL HIGH (ref 22–32)
Calcium: 9.7 mg/dL (ref 8.9–10.3)
Chloride: 92 mmol/L — ABNORMAL LOW (ref 98–111)
Creatinine, Ser: 0.84 mg/dL (ref 0.61–1.24)
GFR, Estimated: >60 mL/min (ref >60)
Glucose, Bld: 97 mg/dL (ref 70–99)
Potassium: 3.7 mmol/L (ref 3.5–5.1)
Sodium: 140 mmol/L (ref 135–145)

## 2021-04-20 LAB — MAGNESIUM: Magnesium: 2.4 mg/dL (ref 1.7–2.4)

## 2021-04-20 MED ORDER — LIVING BETTER WITH HEART FAILURE BOOK: Freq: Once | Status: DC

## 2021-04-20 NOTE — Progress Notes (Addendum)
? ?Progress Note ? ?Patient Name: [name] ?Date of Encounter: 04/20/2021 ? ?Primary Cardiologist: Sanda Klein, MD ? ?Subjective  ? ?Overall feeling better. Denies CP or SOB. Reports good urination. Thinks edema has been improving but he cannot overtly see for himself. ? ?Reports anticipated DC soon but pending facility placement. ? ?Inpatient Medications  ?  ?Scheduled Meds: ? (feeding supplement) PROSource Plus  30 mL Oral BID BM  ? amoxicillin-clavulanate  1 tablet Oral Q12H  ? apixaban  5 mg Oral BID  ? vitamin C  500 mg Oral Daily  ? clotrimazole   Topical BID  ? doxycycline  100 mg Oral Q12H  ? gabapentin  800 mg Oral TID  ? levothyroxine  125 mcg Oral Q0600  ? losartan  25 mg Oral Daily  ? mouth rinse  15 mL Mouth Rinse BID  ? metoprolol succinate  100 mg Oral Daily  ? multivitamin with minerals  1 tablet Oral Daily  ? nutrition supplement (JUVEN)  1 packet Oral BID WC  ? Ensure Max Protein  11 oz Oral Daily  ? torsemide  20 mg Oral Daily  ? vitamin B-12  1,000 mcg Oral Daily  ? ?Continuous Infusions: ? ?PRN Meds: ?acetaminophen **OR** acetaminophen, methocarbamol  ? ?Vital Signs  ?  ?Vitals:  ? 04/19/21 0635 04/19/21 1343 04/19/21 1944 04/20/21 0609  ?BP: (!) 143/87 109/75 102/68 114/74  ?Pulse: 84 (!) 107 97 88  ?Resp: '16 17 20 20  '$ ?Temp: 98 ?F (36.7 ?C) 98 ?F (36.7 ?C) 98.6 ?F (37 ?C) 98.6 ?F (37 ?C)  ?TempSrc: Tympanic Oral Oral Oral  ?SpO2: 96% 93% 91% 91%  ?Weight:      ?Height:      ? ? ?Intake/Output Summary (Last 24 hours) at 04/20/2021 0923 ?Last data filed at 04/20/2021 0858 ?Gross per 24 hour  ?Intake 790 ml  ?Output 2650 ml  ?Net -1860 ml  ? ?Last 3 Weights 04/19/2021 04/17/2021 04/16/2021  ?Weight (lbs) (No Data) 405 lb 405 lb  ?Weight (kg) (No Data) 183.707 kg 183.707 kg  ?  ? ?Telemetry  ?  ?Atrial fib rates 90s-100s - Personally Reviewed ? ?Physical Exam  ? ?GEN: No acute distress. Severely morbidly obese ?HEENT: Normocephalic, atraumatic, sclera non-icteric. ?Neck: No bruits. Unable  to assess JVD due to habitus ?Cardiac: Irregularly irregular, no murmurs, rubs, or gallops.  ?Respiratory: Clear to auscultation bilaterally. Breathing is unlabored. ?GI: Obese pannus. Otherwise soft, nontender, non-distended, BS +x 4. ?MS: L AKA. ?Extremities: s/p L AKA. Chronic venous stasis changes of the RLE with mild edema ?Neuro:  AAOx3. Follows commands. ?Psych:  Responds to questions appropriately with a normal affect. ? ?Labs  ?  ?High Sensitivity Troponin:   ?Recent Labs  ?Lab 04/13/21 ?0800 04/13/21 ?1215  ?TROPONINIHS 6 7  ?   ? ?Cardiac EnzymesNo results for input(s): TROPONINI in the last 168 hours. No results for input(s): TROPIPOC in the last 168 hours.  ? ?Chemistry ?Recent Labs  ?Lab 04/14/21 ?0315 04/15/21 ?9458 04/18/21 ?5929 04/19/21 ?2446 04/20/21 ?2863  ?NA 139   < > 140 141 140  ?K 4.3   < > 3.3* 3.6 3.7  ?CL 98   < > 93* 93* 92*  ?CO2 34*   < > 39* 40* 39*  ?GLUCOSE 85   < > 95 99 97  ?BUN 12   < > 23 21 29*  ?CREATININE 0.99   < > 0.76 0.70 0.84  ?CALCIUM 8.9   < > 9.5 9.9  9.7  ?PROT 6.6  --   --   --   --   ?ALBUMIN 2.9*  --   --   --   --   ?AST 18  --   --   --   --   ?ALT 12  --   --   --   --   ?ALKPHOS 76  --   --   --   --   ?BILITOT 1.1  --   --   --   --   ?GFRNONAA >60   < > >60 >60 >60  ?ANIONGAP 7   < > '8 8 9  '$ ? < > = values in this interval not displayed.  ?  ? ?Hematology ?Recent Labs  ?Lab 04/14/21 ?1505 04/15/21 ?6979  ?WBC 4.0 3.8*  ?RBC 3.49* 3.25*  ?HGB 10.8* 10.0*  ?HCT 38.1* 35.5*  ?MCV 109.2* 109.2*  ?MCH 30.9 30.8  ?MCHC 28.3* 28.2*  ?RDW 18.5* 17.6*  ?PLT 225 191  ? ? ?BNP ?Recent Labs  ?Lab 04/13/21 ?0830  ?BNP 228.1*  ?  ? ?DDimer No results for input(s): DDIMER in the last 168 hours.  ? ?Radiology  ?  ?No results found. ? ?Cardiac Studies  ? ?2d echo 04/15/21 ? 1. Left ventricular ejection fraction, by estimation, is 45 to 50%. The  ?left ventricle has mildly decreased function. The left ventricle  ?demonstrates global hypokinesis. Left ventricular diastolic  function could  ?not be evaluated.  ? 2. Right ventricular systolic function was not well visualized. The right  ?ventricular size is moderately enlarged. There is moderately elevated  ?pulmonary artery systolic pressure.  ? 3. Left atrial size was moderately dilated.  ? 4. Right atrial size was severely dilated.  ? 5. The mitral valve is grossly normal. Trivial mitral valve  ?regurgitation. No evidence of mitral stenosis.  ? 6. The aortic valve was not well visualized. Aortic valve regurgitation  ?is not visualized. No aortic stenosis is present.  ? 7. Pulmonic valve regurgitation not visualized.  ? 8. The inferior vena cava is dilated in size with <50% respiratory  ?variability, suggesting right atrial pressure of 15 mmHg.  ? ?Comparison(s): Prior images reviewed side by side. Visually studies appear  ?similar when viewed side by side.  ? ?Conclusion(s)/Recommendation(s): Technically challenging images with  ?limited windows, recommend echo contrast for future studies. EF visually  ?unchanged from prior, though appears mildly abnormal on both studies. RV  ?appears enlarged but cannot be well  ?seen. IVC very dilated and does not collapse.  ? ?Patient Profile  ?   ?71 y.o. 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 Has followed with palliative medicine as outpatient, full code. He presented with right groin rash, scrotal swelling and increased erythema. Was also confused on admission. Scrotal U/S showed complex multiloculated cystic structure in the right scrotum consistent with complex hydrocele and a 2.1cm cystic area adjacent to the right testicle both of which are unchanged from previous U/S. Urology felt this was likely due to early cellulitis and  fluid overload. ? ?Assessment & Plan  ?  ?1. Acute on chronic combined CHF (EF 45-50%, likely moderate pulm HTN as well) ?- diuresed -19.6L, has not been weighed recently, will order ?- volume status technically challenging  due to body habitus/extreme obesity -> BUN/CO2 started to rise yesterday so transitioned to torsemide '20mg'$  daily (got IV Lasix yesterday as well), also started on losartan '25mg'$  daily -> BUN up to 29 but Cr and Co2 stable, follow on this regimen ?- rx CHF booklet ?- recommend daily weights at SNF and early notification for return of volume ? ?2. Permanent atrial fibrillation ?- continue Eliquis '5mg'$  BID ?- continue Toprol '100mg'$  daily as rate control strategy - rates 90s-low 100s, similar to recent baseline looking back to mid 2022 -> consider further titration to '100mg'$  QAM/'50mg'$  QPM ? ?3. Chronic hypoxic respiratory failure  ?- suspect OHS/OSA ?- on supplemental O2 ? ?4. Cellulitis ?- per primary team ? ?5. Anemia ?- appears chronic, macrocytic, with mildly decreased WBC as well ?- Hgb near baseline of 9-10 ? ?6. Thyroid disease ?- on levothyroxine at home, check TSH with AM labs ? ?TOC f/u arranged Thursday Apr 30, 2021 8:45 AM (per office protocol will hold off on TOC ph call since pt is to SNF). ? ?For questions or updates, please contact [place] ?Please consult [email or web address] for contact info under Cardiology/STEMI. ? ?Signed, ?Charlie Pitter, PA-C ?04/20/2021, 9:23 AM   ? ?History and all data above reviewed.  Patient examined.  I agree with the findings as above.  Denies any acute SOB or pain.    The patient exam reveals COR:RRR  ,  Lungs: Clear but decreased breath sounds and difficult exam  ,  Abd: Obese, Ext Moderate right leg edema, left AKA  .  All available labs, radiology testing, previous records reviewed. Agree with documented assessment and plan. Acute diastolic HF:  Agree with transition to PO diuretic and fluid restriction.  We will follow as needed.    Minus Breeding  10:55 AM   04/20/2021 ? ?

## 2021-04-20 NOTE — TOC Progression Note (Signed)
Transition of Care (TOC) - Progression Note  ? ? ?Patient Details  ?Name: [name] ?MRN: [id number] ?Date of Birth: 1950/05/14 ? ?Transition of Care (TOC) CM/SW Contact  ?[place], LCSW ?Phone Number: ?04/20/2021, 12:30 PM ? ?Clinical Narrative:    ? ?Pt initially had 4 bed offers. CSW contacted facilities that offered; all of them rescinded offer due being unable to manage pt's size. Most recent weight is 477lbs; there may be facilities in [place] willing to consider pt if he gets below 450lbs as that is their weight limit.  ? ?Expected Discharge Plan: [place] ?Barriers to Discharge: Ship broker, SNF Pending bed offer ? ?Expected Discharge Plan and Services ?Expected Discharge Plan: [place] ?  ?Discharge Planning Services: CM Consult ?Post Acute Care Choice: [place] ?Living arrangements for the past 2 months: [place] ?                ?  ?  ?  ?  ?  ?  ?  ?  ?  ?  ? ? ?Social Determinants of Health (SDOH) Interventions ?  ? ?Readmission Risk Interventions ?Readmission Risk Prevention Plan 11/09/2019  ?Transportation Screening Complete  ?PCP or Specialist Appt within 5-7 Days Complete  ?Home Care Screening Complete  ?Medication Review (RN CM) Complete  ?Some recent data might be hidden  ? ? ?

## 2021-04-20 NOTE — Progress Notes (Signed)
?Progress Note ? ? ?Patient: [name] WCB:[id number] DOB: January 18, 1951 DOA: 04/13/2021     7 ?DOS: the patient was seen and examined on 04/20/2021 ?  ?Brief hospital course: ?71 y.o. male with medical history significant of pAfib, Hx of DVT, chronic diastolic HF, hypothyroidism, morbid obesity, chronic hypoxic respiratory failure on 4L [place], intermittent scrotal and groin swelling for several months requiring multiple ED visits presented with increased shortness of breath along with groin swelling and redness.  On presentation, BNP 228.1; chest x-ray showed no signs of pulmonary edema or focal pulmonary consolidation.  CT of abdomen/pelvis showed subcutaneous edema in the right inguinal region/perineum with mild skin thickening but no drainable fluid collection or abscess along with scrotal wall thickening with possible hydrocele.  Urology was consulted.  Echo showed EF of 45 to 50%.  Cardiology was consulted on 04/16/2021.  PT recommended SNF placement ? ?Assessment and Plan: ?Cellulitis of groin, right ?Scrotal cellulitis ?-CT imaging as above.  Scrotal ultrasound also showed mild scrotal edema along with possible complex hydroceles. ?-Currently on broad-spectrum antibiotics.  Switched antibiotics to Augmentin and doxycycline; finish 10-day course of therapy. ?-Urology recommended conservative management with antibiotics; scrotal elevation and to continue Lotrimin cream to prevent/treat fungal infection. ? ?Acute on chronic combined systolic and diastolic CHF (congestive heart failure) [facility]): Patient on admission ?- Strict input and output.  Daily weights.  Fluid restriction.  Negative balance of 19687.6 cc since admission.  Continue metoprolol.  IV Lasix has been switched to oral torsemide by cardiology on 04/19/2021.  Monitor creatinine. ?-Echo showed EF of 45 to 50% with global hypokinesis.  Cardiology following. ? ?Hypokalemia ?-Improved ? ?Chronic respiratory failure with hypoxia (HCC) ?OSA ?-Normally uses 3 to  4 L oxygen via nasal cannula during daytime and CPAP at night.  Continue same. ? ?Hypothyroidism ?- Continue Synthroid ? ?Persistent atrial fibrillation ([place]) ?History of DVT ?-Continue metoprolol and Eliquis ? ?Morbid obesity with BMI of 60.0-69.9, adult ([place]) ?- Outpatient follow-up ? ?Physical deconditioning ?- PT recommends SNF placement.  Social worker following. ? ?Anemia of chronic disease ?Macrocytosis ?-Possibly from chronic illnesses.  Hemoglobin stable.  Monitor. ? ?Pressure injury of skin ?Stage II pressure injury right posterior thigh: Present on admission ?-Continue local wound care ? ?Subjective:  ?Patient seen and examined at bedside.  Poor historian.  No fever, chest pain, vomiting reported.   ?Physical Exam: ?Vitals:  ? 04/19/21 0635 04/19/21 1343 04/19/21 1944 04/20/21 0609  ?BP: (!) 143/87 109/75 102/68 114/74  ?Pulse: 84 (!) 107 97 88  ?Resp: '16 17 20 20  '$ ?Temp: 98 ?F (36.7 ?C) 98 ?F (36.7 ?C) 98.6 ?F (37 ?C) 98.6 ?F (37 ?C)  ?TempSrc: Tympanic Oral Oral Oral  ?SpO2: 96% 93% 91% 91%  ?Weight:      ?Height:      ? ?General: No acute distress.  On CPAP currently looks chronically ill and deconditioned. Slow to respond; poor historian. ?respiratory: Bilateral decreased breath sounds at bases with scattered rales, no wheezing  ?CVS: S1-S2 heard; rate controlled currently ?abdominal: Soft, morbidly obese, nontender, distended slightly; no organomegaly; bowel sounds are heard  ?extremities: No cyanosis; mild lower extremity edema present; left AKA present ? ? ?Data Reviewed: ?I have reviewed patient's investigations during this hospitalization myself.  Today, sodium 140, potassium 3.7, chloride 92, bicarb 39, creatinine 0.84, magnesium 2.4 ? ?Family Communication: None at bedside ?  ?Disposition: ?Status is: Inpatient ?Remains inpatient appropriate because: Of need for SNF placement ? planned Discharge Destination: Skilled nursing facility.  Currently medically stable for  discharge ? ? ? ? ?Author: ?Aline August, MD ?04/20/2021 7:29 AM ? ?For on call review [email or web address].  ?

## 2021-04-21 DIAGNOSIS — L03314 Cellulitis of groin: Secondary | ICD-10-CM | POA: Diagnosis not present

## 2021-04-21 DIAGNOSIS — J9611 Chronic respiratory failure with hypoxia: Secondary | ICD-10-CM | POA: Diagnosis not present

## 2021-04-21 DIAGNOSIS — D638 Anemia in other chronic diseases classified elsewhere: Secondary | ICD-10-CM | POA: Diagnosis not present

## 2021-04-21 DIAGNOSIS — I5043 Acute on chronic combined systolic (congestive) and diastolic (congestive) heart failure: Secondary | ICD-10-CM | POA: Diagnosis not present

## 2021-04-21 LAB — CBC
HCT: 38.9 % — ABNORMAL LOW (ref 39.0–52.0)
Hemoglobin: 11.2 g/dL — ABNORMAL LOW (ref 13.0–17.0)
MCH: 30.4 pg (ref 26.0–34.0)
MCHC: 28.8 g/dL — ABNORMAL LOW (ref 30.0–36.0)
MCV: 105.4 fL — ABNORMAL HIGH (ref 80.0–100.0)
Platelets: 181 10*3/uL (ref 150–400)
RBC: 3.69 MIL/uL — ABNORMAL LOW (ref 4.22–5.81)
RDW: 16.9 % — ABNORMAL HIGH (ref 11.5–15.5)
WBC: 3.5 10*3/uL — ABNORMAL LOW (ref 4.0–10.5)
nRBC: 0 % (ref 0.0–0.2)

## 2021-04-21 LAB — BASIC METABOLIC PANEL
Anion gap: 10 (ref 5–15)
BUN: 44 mg/dL — ABNORMAL HIGH (ref 8–23)
CO2: 36 mmol/L — ABNORMAL HIGH (ref 22–32)
Calcium: 9.9 mg/dL (ref 8.9–10.3)
Chloride: 92 mmol/L — ABNORMAL LOW (ref 98–111)
Creatinine, Ser: 0.78 mg/dL (ref 0.61–1.24)
GFR, Estimated: >60 mL/min (ref >60)
Glucose, Bld: 102 mg/dL — ABNORMAL HIGH (ref 70–99)
Potassium: 3.8 mmol/L (ref 3.5–5.1)
Sodium: 138 mmol/L (ref 135–145)

## 2021-04-21 LAB — MAGNESIUM: Magnesium: 2.5 mg/dL — ABNORMAL HIGH (ref 1.7–2.4)

## 2021-04-21 LAB — TSH: TSH: 6.712 u[IU]/mL — ABNORMAL HIGH (ref 0.350–4.500)

## 2021-04-21 MED ORDER — PROSOURCE PLUS PO LIQD
30.0000 mL | Freq: Three times a day (TID) | ORAL | Status: DC
  Administered 2021-04-21 – 2021-05-07 (×38): 30 mL via ORAL
  Filled 2021-04-21 (×39): qty 30

## 2021-04-21 NOTE — Progress Notes (Signed)
Physical Therapy Treatment ?Patient Details ?Name: [name] ?MRN: [id number] ?DOB: Jul 19, 1950 ?Today's Date: 04/21/2021 ? ? ?History of Present Illness [name] is a 71 y.o. male presents with increased edema to his perineum and lower abdomen. CT of abdomen/pelvis showed subcutaneous edema in the right inguinal region/perineum with mild skin thickening but no drainable fluid collection or abscess along with scrotal wall thickening with possible hydrocele. 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 ?General Comments: AxO x 3 very familiar from prior admits.  Very motivated and pleasant. ?General bed mobility comments: pt does well with rolling side to side using rails and momentum.  Assisted to EOB same fashion with HOB elevated.  Static sit EOB at Spupervision level (caution avoid sliding out Air mattress)  Placed recliner chair in front to suppor L AKA with arm rest dropped and positioned 1/2 such that R LE was cleared to be placed on floor and scrotum was also clear.  So recliner chair is "facing" pt. General transfer comment: pt was able to perform 4 sit to stands from bed.  Recliner chair supporting L AKA and used the back of a regular chair angled on pt's RIGHT for him to use for support as Therapsit used her body weight to secure straight chair from moving.  Pt was able to clear buttocks each time and stand approx 8 seconds. Performed 4 sit to stand activities. Assisted back to bed and positioned to comfort. ?Pt will need ST Rehab at SNF prior to retuning to [facility]. ?  ?PT Comments  ? ?   ?Recommendations for follow up therapy are one component of a multi-disciplinary discharge planning process, led by the attending physician.  Recommendations may be updated based on patient status, additional functional criteria and insurance authorization. ? ?Follow Up Recommendations ? Skilled nursing-short term rehab (<3 hours/day) ?  ?  ?Assistance Recommended at  Discharge Frequent or constant Supervision/Assistance  ?Patient can return home with the following Two people to help with walking and/or transfers;Two people to help with bathing/dressing/bathroom;Assistance with cooking/housework ?  ?Equipment Recommendations ? None recommended by PT  ?  ?Recommendations for Other Services   ? ? ?  ?Precautions / Restrictions Precautions ?Precautions: Fall ?Precaution Comments: scrotal/groin edema ?Restrictions ?Weight Bearing Restrictions: No  ?  ? ?Mobility ? Bed Mobility ?Overal bed mobility: Needs Assistance ?Bed Mobility: Rolling, Sit to Supine, Supine to Sit ?Rolling: Mod assist, Max assist, +2 for physical assistance, +2 for safety/equipment ?  ?Supine to sit: Max assist, +2 for physical assistance, +2 for safety/equipment ?Sit to supine: Max assist, +2 for physical assistance, +2 for safety/equipment ?  ?General bed mobility comments: pt does well with rolling side to side using rails and momentum.  Assisted to EOB same fashion with HOB elevated.  Static sit EOB at Spupervision level (caution avoid sliding out Air mattress)  Placed recliner chair in front to suppor L AKA with arm rest dropped and positioned 1/2 such that R LE was cleared to be placed on floor and scrotum was also clear.  So recliner chair is "facing" pt. ?  ? ?Transfers ?  ?  ?Transfers: Sit to/from Stand ?Sit to Stand: Max assist ?  ?  ?  ?  ?  ?General transfer comment: pt was able to perform 4 sit to stands from bed.  Recliner chair supporting L AKA and used the back of a regular chair angled on pt's RIGHT for him to use for support as Therapsit  used her body weight to secure straight chair from moving.  Pt was able to clear buttocks each time and stand approx 8 seconds. ?  ? ?Ambulation/Gait ?  ?  ?  ?  ?  ?  ?  ?  ? ? ?Stairs ?  ?  ?  ?  ?  ? ? ?Wheelchair Mobility ?  ? ?Modified Rankin (Stroke Patients Only) ?  ? ? ?  ?Balance   ?  ?  ?  ?  ?  ?  ?  ?  ?  ?  ?  ?  ?  ?  ?  ?  ?  ?  ?  ? ?  ?Cognition  Arousal/Alertness: Awake/alert ?Behavior During Therapy: [facility] for tasks assessed/performed ?Overall Cognitive Status: No family/caregiver present to determine baseline cognitive functioning ?  ?  ?  ?  ?  ?  ?  ?  ?  ?  ?  ?  ?  ?  ?  ?  ?General Comments: AxO x 3 very familiar from prior admits.  Very motivated and pleasant. ?  ?  ? ?  ?Exercises   ? ?  ?General Comments   ?  ?  ? ?Pertinent Vitals/Pain Pain Assessment ?Pain Assessment: Faces ?Faces Pain Scale: Hurts a little bit ?Pain Location: scrotum ?Pain Descriptors / Indicators: Burning ?Pain Intervention(s): Monitored during session  ? ? ?Home Living   ?  ?  ?  ?  ?  ?  ?  ?  ?  ?   ?  ?Prior Function    ?  ?  ?   ? ?PT Goals (current goals can now be found in the care plan section) Progress towards PT goals: Progressing toward goals ? ?  ?Frequency ? ? ? Min 2X/week ? ? ? ?  ?PT Plan Current plan remains appropriate  ? ? ?Co-evaluation   ?  ?  ?  ?  ? ?  ?AM-PAC PT "6 Clicks" Mobility   ?Outcome Measure ? Help needed turning from your back to your side while in a flat bed without using bedrails?: Total ?Help needed moving from lying on your back to sitting on the side of a flat bed without using bedrails?: Total ?Help needed moving to and from a bed to a chair (including a wheelchair)?: Total ?Help needed standing up from a chair using your arms (e.g., wheelchair or bedside chair)?: Total ?Help needed to walk in hospital room?: Total ?Help needed climbing 3-5 steps with a railing? : Total ?6 Click Score: 6 ? ?  ?End of Session   ?Activity Tolerance: Patient tolerated treatment well ?Patient left: in bed;with call bell/phone within reach ?Nurse Communication: Mobility status ?PT Visit Diagnosis: Other abnormalities of gait and mobility (R26.89);Muscle weakness (generalized) (M62.81) ?  ? ? ?Time: 2956-2130 ?PT Time Calculation (min) (ACUTE ONLY): 36 min ? ?Charges:  $Therapeutic Activity: 23-37 mins          ?          ? ?Rica Koyanagi  PTA ?Acute   Rehabilitation Services ?Pager      [phone] ?Office      [phone] ? ? ?

## 2021-04-21 NOTE — Progress Notes (Signed)
?Progress Note ? ? ?Patient: [name] FVC:[id number] DOB: February 18, 1950 DOA: 04/13/2021     8 ?DOS: the patient was seen and examined on 04/21/2021 ?  ?Brief hospital course: ?71 y.o. male with medical history significant of pAfib, Hx of DVT, chronic diastolic HF, hypothyroidism, morbid obesity, chronic hypoxic respiratory failure on 4L [place], intermittent scrotal and groin swelling for several months requiring multiple ED visits presented with increased shortness of breath along with groin swelling and redness.  On presentation, BNP 228.1; chest x-ray showed no signs of pulmonary edema or focal pulmonary consolidation.  CT of abdomen/pelvis showed subcutaneous edema in the right inguinal region/perineum with mild skin thickening but no drainable fluid collection or abscess along with scrotal wall thickening with possible hydrocele.  Urology was consulted.  Echo showed EF of 45 to 50%.  Cardiology was consulted on 04/16/2021.  PT recommended SNF placement ? ?Assessment and Plan: ?Cellulitis of groin, right ?Scrotal cellulitis ?-CT imaging as above.  Scrotal ultrasound also showed mild scrotal edema along with possible complex hydroceles. ?-Currently on broad-spectrum antibiotics.  Switched antibiotics to Augmentin and doxycycline; finish 10-day course of therapy. ?-Urology recommended conservative management with antibiotics; scrotal elevation and to continue Lotrimin cream to prevent/treat fungal infection. ? ?Acute on chronic combined systolic and diastolic CHF (congestive heart failure) [facility]): Patient on admission ?- Strict input and output.  Daily weights.  Fluid restriction.  Negative balance of 20,637.6 cc since admission.  Continue metoprolol.  IV Lasix has been switched to oral torsemide by cardiology on 04/19/2021.  Monitor creatinine.  Losartan has been added by cardiology as well. ?-Echo showed EF of 45 to 50% with global hypokinesis.  Cardiology following. ? ?Hypokalemia ?-Improved ? ?Chronic respiratory  failure with hypoxia (HCC) ?OSA ?-Normally uses 3 to 4 L oxygen via nasal cannula during daytime and CPAP at night.  Continue same. ? ?Hypothyroidism ?- Continue Synthroid ? ?Persistent atrial fibrillation ([place]) ?History of DVT ?-Continue metoprolol and Eliquis ? ?Morbid obesity with BMI of 60.0-69.9, adult ([place]) ?- Outpatient follow-up ? ?Physical deconditioning ?- PT recommends SNF placement.  Social worker following. ? ?Anemia of chronic disease ?Macrocytosis ?-Possibly from chronic illnesses.  Hemoglobin stable.  Monitor. ? ?Pressure injury of skin ?Stage II pressure injury right posterior thigh: Present on admission ?-Continue local wound care ? ?Subjective:  ?Patient seen and examined at bedside.  Poor historian.  Still short of breath with minimal ambulation.  No chest pain, worsening abdominal pain, vomiting or diarrhea reported.   ?Physical Exam: ?Vitals:  ? 04/20/21 1417 04/20/21 1942 04/20/21 2022 04/21/21 0430  ?BP: 126/88 110/72  124/79  ?Pulse: 86 87 100 84  ?Resp: '16 20 16 20  '$ ?Temp: 98.7 ?F (37.1 ?C) 97.9 ?F (36.6 ?C)  98 ?F (36.7 ?C)  ?TempSrc: Oral     ?SpO2: 93% 95% 95% 96%  ?Weight:      ?Height:      ? ?General: Currently on CPAP.  No distress.  Looks chronically ill and deconditioned. Slow to respond; poor historian. ?respiratory: Decreased breath sounds at bases bilaterally with some crackles; intermittently tachypneic  ?CVS: Currently rate controlled; S1-S2 heard ?abdominal: Soft, morbidly obese, nontender, mildly distended; no organomegaly; normal bowel sounds heard  ?extremities: Left AKA present; right lower extremity edema present; no clubbing ? ?Data Reviewed: ?I have reviewed patient's investigations during this hospitalization myself.  Today, sodium 138, potassium 3.8, chloride 92, bicarb 36, creatinine 0.78, magnesium 2.5 ? ?Family Communication: None at bedside ?  ?Disposition: ?Status is: Inpatient ?Remains  inpatient appropriate because: Of need for SNF placement ? planned Discharge  Destination: Skilled nursing facility.  Currently medically stable for discharge ? ? ? ? ?Author: ?Aline August, MD ?04/21/2021 7:34 AM ? ?For on call review [email or web address]  ?

## 2021-04-21 NOTE — Progress Notes (Signed)
Nutrition Follow-up ? ?DOCUMENTATION CODES:  ? ?Morbid obesity ? ?INTERVENTION:  ? ?-D/c Ensure Max ? ?-Continue Juven BID and Prosource Plus TID ? ?-Continue daily weights ? ?NUTRITION DIAGNOSIS:  ? ?Increased nutrient needs related to wound healing as evidenced by estimated needs. ? ?Ongoing. ? ?GOAL:  ? ?Patient will meet greater than or equal to 90% of their needs ? ?Progressing. ? ?MONITOR:  ? ?PO intake, Supplement acceptance, Labs, Weight trends, I & O's, Skin ? ?ASSESSMENT:  ? ?71 y.o. male with medical history significant of pAfib, Hx of DVT, chronic diastolic HF, hypothyroidism, morbid obesity, chronic hypoxic respiratory failure on 4L [place], intermittent scrotal and groin swelling for several months requiring multiple ED visits presented with increased shortness of breath along with groin swelling and redness. ? ?Patient currently consuming 10-100% of meals. Will continue Juven and Prosource. Will d/c Ensure Max. ? ?Admission weight:  402 lbs. ?Recorded weight on 3/13: 477 lbs -likely from fluid even though pt has been diuresing. Edema has improved. Pt had significant weight loss PTA. Needs to be reweighed to check for accuracy.  ?Noted daily weights have been ordered. ? ?I/Os: -20.3L since admit ? ?Medications: Vitamin C, Multivitamin with minerals daily, Demadex, Vitamin B-12 ? ?Labs reviewed: ?Elevated Mg (2.5) ? ?Diet Order:   ?Diet Order   ? ?       ?  Diet Heart Room service appropriate? Yes; Fluid consistency: Thin; Fluid restriction: 1200 mL Fluid  Diet effective now       ?  ? ?  ?  ? ?  ? ? ?EDUCATION NEEDS:  ? ?Not appropriate for education at this time ? ?Skin:  Skin Assessment: Skin Integrity Issues: ?Skin Integrity Issues:: Stage II ?Stage II: right thigh ? ?Last BM:  3/13 ? ?Height:  ? ?Ht Readings from Last 1 Encounters:  ?04/17/21 '5\' 8"'$  (1.727 m)  ? ? ?Weight:  ? ?Wt Readings from Last 1 Encounters:  ?04/20/21 (!) 216.4 kg  ? ? ?BMI:  Body mass index is 79 kg/m?. -adjusted for left  AKA ? ?Estimated Nutritional Needs:  ? ?Kcal:  2100-2300 ? ?Protein:  115-130g ? ?Fluid:  2L/day ? ?[name] Bibles, MS, RD, LDN ?Inpatient Clinical Dietitian ?Contact information available via Amion ? ?

## 2021-04-22 DIAGNOSIS — I4891 Unspecified atrial fibrillation: Secondary | ICD-10-CM | POA: Diagnosis not present

## 2021-04-22 LAB — BASIC METABOLIC PANEL
Anion gap: 8 (ref 5–15)
BUN: 47 mg/dL — ABNORMAL HIGH (ref 8–23)
CO2: 39 mmol/L — ABNORMAL HIGH (ref 22–32)
Calcium: 10.2 mg/dL (ref 8.9–10.3)
Chloride: 92 mmol/L — ABNORMAL LOW (ref 98–111)
Creatinine, Ser: 0.96 mg/dL (ref 0.61–1.24)
GFR, Estimated: >60 mL/min (ref >60)
Glucose, Bld: 106 mg/dL — ABNORMAL HIGH (ref 70–99)
Potassium: 3.8 mmol/L (ref 3.5–5.1)
Sodium: 139 mmol/L (ref 135–145)

## 2021-04-22 LAB — MAGNESIUM: Magnesium: 2.4 mg/dL (ref 1.7–2.4)

## 2021-04-22 MED ORDER — DIGOXIN 125 MCG PO TABS
0.1250 mg | ORAL_TABLET | Freq: Every day | ORAL | Status: DC
  Administered 2021-04-22 – 2021-05-07 (×15): 0.125 mg via ORAL
  Filled 2021-04-22 (×16): qty 1

## 2021-04-22 NOTE — Progress Notes (Addendum)
?[facility] Progress Note ? ?Patient: [name]     ?PIR:[id number]  ?DOA: 04/13/2021   ?  ?  ?Brief hospital course: ?71 y.o. male who lives in assisted living and has PAfib, Hx of DVT, chronic diastolic HF, hypothyroidism, morbid obesity, chronic hypoxic respiratory failure on 4L [place], intermittent scrotal and groin swelling for several months requiring multiple ED visits presented with increased shortness of breath along with groin swelling and redness.   ? ?CT of abdomen/pelvis showed subcutaneous edema in the right inguinal region/perineum with mild skin thickening but no drainable fluid collection or abscess along with scrotal wall thickening with possible hydrocele.  Treated for scrotal cellulitis and acute on chronic heart failure. Now awaiting SNF.  ? ?Subjective:  ?He has no complaints for me.  ? ?Assessment and Plan: ?Assessment and Plan: ?Cellulitis of groin, right ?Scrotal cellulitis ?-CT imaging as above.  Scrotal ultrasound also showed mild scrotal edema along with possible complex hydroceles ?--Urology felt he may have had early cellulitis and fluid overload ?-Currently receiving Augmentin and doxycycline. ?- today is day 10 of antibiotics- I will dc antibiotics today ? ? ?Acute on chronic combined systolic and diastolic CHF (congestive heart failure) ([place]) ? -Echo showed EF of 45 to 50% with global hypokinesis.   ?- IV Lasix  transitioned to Torsemide on 3/12 ?- cont Losartan and Toprol ? ?Chronic respiratory failure with hypoxia (HCC) ?- question if this is related to OHS ?-Normally uses 3 to 4 L oxygen via nasal cannula during daytime and CPAP at night.    ? ?Hypothyroidism ?- Continue Synthroid ? ?OSA (obstructive sleep apnea) ?- cont CPAP ? ?Persistent atrial fibrillation ([place]) ?History of DVT ?-Continue metoprolol and Eliquis ? ?Morbid obesity with BMI of 60.0-69.9, adult ([place]) ?-Body mass index is 70.53 kg/m?.  ?- weight today is 210 kg ?  ? ?Physical deconditioning ?- PT  recommends SNF placement.    ? ?Pressure injury of skin ?Stage II pressure injury right posterior thigh: Present on admission ?-Continue local wound care ? ? ? ? ? ?DVT prophylaxis:  Eliquis ?  Code Status: Full Code  ?Level of Care: Level of care: Telemetry ?Disposition Plan:  ?Status is: Inpatient ?Remains inpatient appropriate because: unsafe dc ?- have asked for him to continue to be active with PT and either we will find a SNF or he will be strong enough to transfer again and go back to an ALF ? ?Objective: ?  ?Vitals:  ? 04/22/21 0919 04/22/21 1300 04/22/21 1417 04/22/21 1419  ?BP: 116/77  114/79   ?Pulse: 93  94   ?Resp: 17  (!) 21   ?Temp: 97.7 ?F (36.5 ?C)  97.7 ?F (36.5 ?C)   ?TempSrc: Oral  Oral   ?SpO2: 98%  92% 97%  ?Weight:  (!) 210.4 kg    ?Height:      ? ?Filed Weights  ? 04/17/21 0500 04/20/21 0935 04/22/21 1300  ?Weight: (!) 183.7 kg (!) 216.4 kg (!) 210.4 kg  ? ?Exam: ?General exam: Appears comfortable  ?HEENT: PERRLA, oral mucosa moist, no sclera icterus or thrush ?Respiratory system: Clear to auscultation. Respiratory effort normal. ?Cardiovascular system: S1 & S2 heard, regular rate and rhythm ?Gastrointestinal system: Abdomen soft, non-tender, nondistended. Normal bowel sounds   ?Central nervous system: Alert and oriented. No focal neurological deficits. ?Extremities: No cyanosis, clubbing or edema- mild scrotal edema ?Skin: No rashes or ulcers ?Psychiatry:  Mood & affect appropriate.   ? ?Imaging and lab data was personally reviewed ? ? ?  CBC: ?Recent Labs  ?Lab 04/21/21 ?3500  ?WBC 3.5*  ?HGB 11.2*  ?HCT 38.9*  ?MCV 105.4*  ?PLT 181  ? ?Basic Metabolic Panel: ?Recent Labs  ?Lab 04/18/21 ?9381 04/19/21 ?8299 04/20/21 ?3716 04/21/21 ?9678 04/22/21 ?0459  ?NA 140 141 140 138 139  ?K 3.3* 3.6 3.7 3.8 3.8  ?CL 93* 93* 92* 92* 92*  ?CO2 39* 40* 39* 36* 39*  ?GLUCOSE 95 99 97 102* 106*  ?BUN 23 21 29* 44* 47*  ?CREATININE 0.76 0.70 0.84 0.78 0.96  ?CALCIUM 9.5 9.9 9.7 9.9 10.2  ?MG 2.3 2.5* 2.4  2.5* 2.4  ? ?GFR: ?Estimated Creatinine Clearance: 126.8 mL/min (by C-G formula based on SCr of 0.96 mg/dL). ? ?Scheduled Meds: ? (feeding supplement) PROSource Plus  30 mL Oral TID BM  ? apixaban  5 mg Oral BID  ? vitamin C  500 mg Oral Daily  ? clotrimazole   Topical BID  ? digoxin  0.125 mg Oral Daily  ? gabapentin  800 mg Oral TID  ? levothyroxine  125 mcg Oral Q0600  ? Living Better with Heart Failure Book   Does not apply Once  ? losartan  25 mg Oral Daily  ? mouth rinse  15 mL Mouth Rinse BID  ? metoprolol succinate  100 mg Oral Daily  ? multivitamin with minerals  1 tablet Oral Daily  ? nutrition supplement (JUVEN)  1 packet Oral BID WC  ? torsemide  20 mg Oral Daily  ? vitamin B-12  1,000 mcg Oral Daily  ? ?Continuous Infusions: ? ? LOS: 9 days  ? ?Author: ?Debbe Odea  ?04/22/2021 6:16 PM ?   ?

## 2021-04-22 NOTE — Plan of Care (Signed)

## 2021-04-22 NOTE — Progress Notes (Signed)
? ?Progress Note ? ?Patient Name: [name] ?Date of Encounter: 04/22/2021 ? ?Primary Cardiologist:   Sanda Klein, MD ? ? ?Subjective  ? ?The patient feels OK.  He stood with much help.  ? ?Inpatient Medications  ?  ?Scheduled Meds: ? (feeding supplement) PROSource Plus  30 mL Oral TID BM  ? amoxicillin-clavulanate  1 tablet Oral Q12H  ? apixaban  5 mg Oral BID  ? vitamin C  500 mg Oral Daily  ? clotrimazole   Topical BID  ? doxycycline  100 mg Oral Q12H  ? gabapentin  800 mg Oral TID  ? levothyroxine  125 mcg Oral Q0600  ? Living Better with Heart Failure Book   Does not apply Once  ? losartan  25 mg Oral Daily  ? mouth rinse  15 mL Mouth Rinse BID  ? metoprolol succinate  100 mg Oral Daily  ? multivitamin with minerals  1 tablet Oral Daily  ? nutrition supplement (JUVEN)  1 packet Oral BID WC  ? torsemide  20 mg Oral Daily  ? vitamin B-12  1,000 mcg Oral Daily  ? ?Continuous Infusions: ? ?PRN Meds: ?acetaminophen **OR** acetaminophen, methocarbamol  ? ?Vital Signs  ?  ?Vitals:  ? 04/22/21 0919 04/22/21 1300 04/22/21 1417 04/22/21 1419  ?BP: 116/77  114/79   ?Pulse: 93  94   ?Resp: 17  (!) 21   ?Temp: 97.7 ?F (36.5 ?C)  97.7 ?F (36.5 ?C)   ?TempSrc: Oral  Oral   ?SpO2: 98%  92% 97%  ?Weight:  (!) 210.4 kg    ?Height:      ? ? ?Intake/Output Summary (Last 24 hours) at 04/22/2021 1801 ?Last data filed at 04/22/2021 1751 ?Gross per 24 hour  ?Intake 540 ml  ?Output 2400 ml  ?Net -1860 ml  ? ?Filed Weights  ? 04/17/21 0500 04/20/21 0935 04/22/21 1300  ?Weight: (!) 183.7 kg (!) 216.4 kg (!) 210.4 kg  ? ? ?Telemetry  ?  ?Atrial fib with rapid rate at times - Personally Reviewed ? ?ECG  ?  ?NA - Personally Reviewed ? ?Physical Exam  ? ?GEN: No acute distress.   ?Neck: No  JVD ?Cardiac: Irregular RR, no murmurs, rubs, or gallops.  ?Respiratory: Clear to auscultation bilaterally. ?GI: Soft, nontender, non-distended  ?MS: No edema; No deformity. ?Neuro:  Nonfocal  ?Psych: Normal affect  ? ?Labs  ?   ?Chemistry ?Recent Labs  ?Lab 04/20/21 ?0412 04/21/21 ?0347 04/22/21 ?0459  ?NA 140 138 139  ?K 3.7 3.8 3.8  ?CL 92* 92* 92*  ?CO2 39* 36* 39*  ?GLUCOSE 97 102* 106*  ?BUN 29* 44* 47*  ?CREATININE 0.84 0.78 0.96  ?CALCIUM 9.7 9.9 10.2  ?GFRNONAA >60 >60 >60  ?ANIONGAP '9 10 8  '$ ?  ? ?Hematology ?Recent Labs  ?Lab 04/21/21 ?4259  ?WBC 3.5*  ?RBC 3.69*  ?HGB 11.2*  ?HCT 38.9*  ?MCV 105.4*  ?MCH 30.4  ?MCHC 28.8*  ?RDW 16.9*  ?PLT 181  ? ? ?Cardiac EnzymesNo results for input(s): TROPONINI in the last 168 hours. No results for input(s): TROPIPOC in the last 168 hours.  ? ?BNPNo results for input(s): BNP, PROBNP in the last 168 hours.  ? ?DDimer No results for input(s): DDIMER in the last 168 hours.  ? ?Radiology  ?  ?No results found. ? ?Cardiac Studies  ? ? ?2d echo 04/15/21 ? 1. Left ventricular ejection fraction, by estimation, is 45 to 50%. The  ?left ventricle has mildly decreased function. The  left ventricle  ?demonstrates global hypokinesis. Left ventricular diastolic function could  ?not be evaluated.  ? 2. Right ventricular systolic function was not well visualized. The right  ?ventricular size is moderately enlarged. There is moderately elevated  ?pulmonary artery systolic pressure.  ? 3. Left atrial size was moderately dilated.  ? 4. Right atrial size was severely dilated.  ? 5. The mitral valve is grossly normal. Trivial mitral valve  ?regurgitation. No evidence of mitral stenosis.  ? 6. The aortic valve was not well visualized. Aortic valve regurgitation  ?is not visualized. No aortic stenosis is present.  ? 7. Pulmonic valve regurgitation not visualized.  ? 8. The inferior vena cava is dilated in size with <50% respiratory  ?variability, suggesting right atrial pressure of 15 mmHg.  ?Patient Profile  ?   ?71 y.o. 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 Has followed with palliative medicine as outpatient, full code. He presented with right groin rash, scrotal swelling and increased erythema. Was also confused on admission. Scrotal U/S showed complex multiloculated cystic structure in the right scrotum consistent with complex hydrocele and a 2.1cm cystic area adjacent to the right testicle both of which are unchanged from previous U/S. Urology felt this was likely due to early cellulitis and fluid overload. ? ?Assessment & Plan  ?  ?Acute on chronic systolic and diastolic HF : Difficult to assess volume but he appears to be doing well.  No change in therapy.  ? ?Atrial fib:  Rate is mildly elevated.   I am gong to add a low dose of digoxin.   ? ?For questions or updates, please contact [place] ?Please consult [email or web address] for contact info under Cardiology/STEMI. ?  ?Signed, ?Minus Breeding, MD  ?04/22/2021, 6:01 PM   ? ?

## 2021-04-22 NOTE — Assessment & Plan Note (Signed)
-   cont CPAP

## 2021-04-23 DIAGNOSIS — I4891 Unspecified atrial fibrillation: Secondary | ICD-10-CM | POA: Diagnosis not present

## 2021-04-23 LAB — BASIC METABOLIC PANEL
Anion gap: 10 (ref 5–15)
BUN: 42 mg/dL — ABNORMAL HIGH (ref 8–23)
CO2: 33 mmol/L — ABNORMAL HIGH (ref 22–32)
Calcium: 9.8 mg/dL (ref 8.9–10.3)
Chloride: 96 mmol/L — ABNORMAL LOW (ref 98–111)
Creatinine, Ser: 0.94 mg/dL (ref 0.61–1.24)
GFR, Estimated: >60 mL/min (ref >60)
Glucose, Bld: 106 mg/dL — ABNORMAL HIGH (ref 70–99)
Potassium: 3.9 mmol/L (ref 3.5–5.1)
Sodium: 139 mmol/L (ref 135–145)

## 2021-04-23 LAB — MAGNESIUM: Magnesium: 2.2 mg/dL (ref 1.7–2.4)

## 2021-04-23 NOTE — Plan of Care (Signed)

## 2021-04-23 NOTE — Progress Notes (Signed)
?[facility] Progress Note ? ?Patient: [name]     ?WNU:[id number]  ?DOA: 04/13/2021   ?  ?  ?Brief hospital course: ?71 y.o. male who lives in assisted living and has Afib, Hx of DVT, Left AKA, morbid obesity,chronic hypoxic respiratory failure on 4L [place], OSA chronic diastolic HF, hypothyroidism, intermittent scrotal and groin swelling for several months requiring multiple ED visits presented with increased shortness of breath along with groin swelling and redness.   ? ?CT of abdomen/pelvis showed subcutaneous edema in the right inguinal region/perineum with mild skin thickening but no drainable fluid collection or abscess along with scrotal wall thickening with possible hydrocele.  Treated for scrotal cellulitis and acute on chronic heart failure. Now awaiting SNF.  ? ?Subjective:  ?He has no complaints.  ? ?Assessment and Plan: ?Cellulitis of groin, right ?Scrotal cellulitis ?-CT imaging as above.  Scrotal ultrasound also showed mild scrotal edema along with possible complex hydroceles ?--Urology felt he may have had early cellulitis and fluid overload ?- received 10 days of antibiotics up until 3/15 ? ?Acute on chronic combined systolic and diastolic CHF (congestive heart failure) ([place]) ? -Echo showed EF of 45 to 50% with global hypokinesis.   ?- IV Lasix  transitioned to Torsemide on 3/12 ?- cont Losartan and Toprol ? ?Chronic respiratory failure with hypoxia (HCC) ?- question if this is related to OHS ?-Normally uses 3 to 4 L oxygen via nasal cannula during daytime and CPAP at night.    ? ?Hypothyroidism ?- Continue Synthroid ? ?OSA (obstructive sleep apnea) ?- cont CPAP ? ?Persistent atrial fibrillation ([place]) ?-Continue Toprol and Eliquis ?- Dig added on 3/15 - rates improved ? ?Morbid obesity with BMI of 60.0-69.9, adult ([place]) ?-Body mass index is 70.53 kg/m?.  ?-  Most recent weight is 210 kg ?  ? ?Physical deconditioning ?- PT recommends SNF placement.    ?  ? ?Pressure injury of skin ?Stage  II pressure injury right posterior thigh: Present on admission ?-Continue local wound care ? ? ? ? ? ?DVT prophylaxis:   ?apixaban (ELIQUIS) tablet 5 mg   ?  Code Status: Full Code  ?Level of Care: Level of care: Telemetry ?Disposition Plan:  ?Status is: Inpatient ?Remains inpatient appropriate because: awaiting SNF ? ?Objective: ?  ?Vitals:  ? 04/22/21 1417 04/22/21 1419 04/22/21 2006 04/23/21 0427  ?BP: 114/79  100/68 100/68  ?Pulse: 94  88 86  ?Resp: (!) '21  20 20  '$ ?Temp: 97.7 ?F (36.5 ?C)  98.1 ?F (36.7 ?C) 97.6 ?F (36.4 ?C)  ?TempSrc: Oral  Oral Oral  ?SpO2: 92% 97% 95% 96%  ?Weight:      ?Height:      ? ?Filed Weights  ? 04/17/21 0500 04/20/21 0935 04/22/21 1300  ?Weight: (!) 183.7 kg (!) 216.4 kg (!) 210.4 kg  ? ?Exam: ?General exam: Appears comfortable  ?HEENT: PERRLA, oral mucosa moist, no sclera icterus or thrush ?Respiratory system: Clear to auscultation. Respiratory effort normal. ?Cardiovascular system: S1 & S2 heard, regular rate and rhythm ?Gastrointestinal system: Abdomen soft, non-tender, nondistended. Normal bowel sounds   ?Central nervous system: Alert and oriented. No focal neurological deficits. ?Extremities: No cyanosis, clubbing or edema ?Skin: No rashes or ulcers ?Psychiatry:  Mood & affect appropriate.   ? ?Imaging and lab data was personally reviewed ? ? ? CBC: ?Recent Labs  ?Lab 04/21/21 ?0347  ?WBC 3.5*  ?HGB 11.2*  ?HCT 38.9*  ?MCV 105.4*  ?PLT 181  ? ?Basic Metabolic Panel: ?Recent Labs  ?  Lab 04/19/21 ?2542 04/20/21 ?7062 04/21/21 ?3762 04/22/21 ?8315 04/23/21 ?0506  ?NA 141 140 138 139 139  ?K 3.6 3.7 3.8 3.8 3.9  ?CL 93* 92* 92* 92* 96*  ?CO2 40* 39* 36* 39* 33*  ?GLUCOSE 99 97 102* 106* 106*  ?BUN 21 29* 44* 47* 42*  ?CREATININE 0.70 0.84 0.78 0.96 0.94  ?CALCIUM 9.9 9.7 9.9 10.2 9.8  ?MG 2.5* 2.4 2.5* 2.4 2.2  ? ?GFR: ?Estimated Creatinine Clearance: 129.5 mL/min (by C-G formula based on SCr of 0.94 mg/dL). ? ?Scheduled Meds: ? (feeding supplement) PROSource Plus  30 mL Oral TID BM   ? apixaban  5 mg Oral BID  ? vitamin C  500 mg Oral Daily  ? clotrimazole   Topical BID  ? digoxin  0.125 mg Oral Daily  ? gabapentin  800 mg Oral TID  ? levothyroxine  125 mcg Oral Q0600  ? Living Better with Heart Failure Book   Does not apply Once  ? losartan  25 mg Oral Daily  ? mouth rinse  15 mL Mouth Rinse BID  ? metoprolol succinate  100 mg Oral Daily  ? multivitamin with minerals  1 tablet Oral Daily  ? nutrition supplement (JUVEN)  1 packet Oral BID WC  ? torsemide  20 mg Oral Daily  ? vitamin B-12  1,000 mcg Oral Daily  ? ?Continuous Infusions: ? ? LOS: 10 days  ? ?Author: ?Debbe Odea  ?04/23/2021 4:21 PM ?   ?

## 2021-04-23 NOTE — Progress Notes (Addendum)
? ?Progress Note ? ?Patient Name: [name] ?Date of Encounter: 04/23/2021 ? ?Primary Cardiologist: Sanda Klein, MD ? ?Subjective  ? ?No new complaints. No new CP or SOB. ? ?Inpatient Medications  ?  ?Scheduled Meds: ? (feeding supplement) PROSource Plus  30 mL Oral TID BM  ? apixaban  5 mg Oral BID  ? vitamin C  500 mg Oral Daily  ? clotrimazole   Topical BID  ? digoxin  0.125 mg Oral Daily  ? gabapentin  800 mg Oral TID  ? levothyroxine  125 mcg Oral Q0600  ? Living Better with Heart Failure Book   Does not apply Once  ? losartan  25 mg Oral Daily  ? mouth rinse  15 mL Mouth Rinse BID  ? metoprolol succinate  100 mg Oral Daily  ? multivitamin with minerals  1 tablet Oral Daily  ? nutrition supplement (JUVEN)  1 packet Oral BID WC  ? torsemide  20 mg Oral Daily  ? vitamin B-12  1,000 mcg Oral Daily  ? ?Continuous Infusions: ? ?PRN Meds: ?acetaminophen **OR** acetaminophen, methocarbamol  ? ?Vital Signs  ?  ?Vitals:  ? 04/22/21 1417 04/22/21 1419 04/22/21 2006 04/23/21 0427  ?BP: 114/79  100/68 100/68  ?Pulse: 94  88 86  ?Resp: (!) '21  20 20  '$ ?Temp: 97.7 ?F (36.5 ?C)  98.1 ?F (36.7 ?C) 97.6 ?F (36.4 ?C)  ?TempSrc: Oral  Oral Oral  ?SpO2: 92% 97% 95% 96%  ?Weight:      ?Height:      ? ? ?Intake/Output Summary (Last 24 hours) at 04/23/2021 0952 ?Last data filed at 04/23/2021 9024 ?Gross per 24 hour  ?Intake 840 ml  ?Output 2300 ml  ?Net -1460 ml  ? ?Last 3 Weights 04/22/2021 04/20/2021 04/19/2021  ?Weight (lbs) 463 lb 13.6 oz 477 lb (No Data)  ?Weight (kg) 210.4 kg 216.366 kg (No Data)  ?  ? ?Telemetry  ?  ?Atrial fib rates 80s-90s presently, rarely low 100s - Personally Reviewed ? ?Physical Exam  ? ?GEN: No acute distress. Severely morbidly obese. ?HEENT: Normocephalic, atraumatic, sclera non-icteric. ?Neck: No bruits. Unable to assess JVD due to habitus. ?Cardiac: Irregularly irregular. no murmurs, rubs, or gallops.  ?Respiratory: Clear to auscultation bilaterally. Breathing is unlabored. ?GI: Obese pannus.  Soft, nontender, non-distended, BS +x 4. ?MS: L AKA. ?Extremities: s/p L AKA. Chronic venous stasis changes of the RLE with mild edema. ?Neuro:  AAOx3. Follows commands. ?Psych:  Responds to questions appropriately with a normal affect. ? ?Labs  ?  ?High Sensitivity Troponin:   ?Recent Labs  ?Lab 04/13/21 ?0800 04/13/21 ?1215  ?TROPONINIHS 6 7  ?   ? ?Cardiac EnzymesNo results for input(s): TROPONINI in the last 168 hours. No results for input(s): TROPIPOC in the last 168 hours.  ? ?Chemistry ?Recent Labs  ?Lab 04/21/21 ?0973 04/22/21 ?5329 04/23/21 ?0506  ?NA 138 139 139  ?K 3.8 3.8 3.9  ?CL 92* 92* 96*  ?CO2 36* 39* 33*  ?GLUCOSE 102* 106* 106*  ?BUN 44* 47* 42*  ?CREATININE 0.78 0.96 0.94  ?CALCIUM 9.9 10.2 9.8  ?GFRNONAA >60 >60 >60  ?ANIONGAP '10 8 10  '$ ?  ? ?Hematology ?Recent Labs  ?Lab 04/21/21 ?9242  ?WBC 3.5*  ?RBC 3.69*  ?HGB 11.2*  ?HCT 38.9*  ?MCV 105.4*  ?MCH 30.4  ?MCHC 28.8*  ?RDW 16.9*  ?PLT 181  ? ? ?BNPNo results for input(s): BNP, PROBNP in the last 168 hours.  ? ?DDimer No results for input(s):  DDIMER in the last 168 hours.  ? ?Radiology  ?  ?No results found. ? ?Cardiac Studies  ? ?2d echo 04/15/21 ? 1. Left ventricular ejection fraction, by estimation, is 45 to 50%. The  ?left ventricle has mildly decreased function. The left ventricle  ?demonstrates global hypokinesis. Left ventricular diastolic function could  ?not be evaluated.  ? 2. Right ventricular systolic function was not well visualized. The right  ?ventricular size is moderately enlarged. There is moderately elevated  ?pulmonary artery systolic pressure.  ? 3. Left atrial size was moderately dilated.  ? 4. Right atrial size was severely dilated.  ? 5. The mitral valve is grossly normal. Trivial mitral valve  ?regurgitation. No evidence of mitral stenosis.  ? 6. The aortic valve was not well visualized. Aortic valve regurgitation  ?is not visualized. No aortic stenosis is present.  ? 7. Pulmonic valve regurgitation not visualized.  ? 8.  The inferior vena cava is dilated in size with <50% respiratory  ?variability, suggesting right atrial pressure of 15 mmHg.  ? ?Patient Profile  ?   ?71 y.o. 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 Has followed with palliative medicine as outpatient, full code. He presented with right groin rash, scrotal swelling and increased erythema. Was also confused on admission. Scrotal U/S showed complex multiloculated cystic structure in the right scrotum consistent with complex hydrocele and a 2.1cm cystic area adjacent to the right testicle both of which are unchanged from previous U/S. Urology felt this was likely due to early cellulitis and fluid overload. ? ?Assessment & Plan  ?  ?1. Acute on chronic combined CHF (EF 45-50%, likely moderate pulm HTN as well) ?- diuresed -23.7L, weights not well tracked earlier this admission, today's weight pending ?- volume status technically challenging due to body habitus/extreme obesity - transitioned to oral torsemide on 3/13 once BUN/CO2 started to ruse - BUN remains up slightly but stable for several days - can continue to follow as OP ?- recommend daily weights at SNF and early notification for return of volume ?  ?2. Permanent atrial fibrillation ?- continue Eliquis '5mg'$  BID ?- continue Toprol '100mg'$  ?- digoxin added 04/22/21 for improved HR rate control ?- rates this AM predominantly 80s-90s, rarely low 100s - this is similar to recent baseline looking back to mid 2022 ?  ?3. Chronic hypoxic respiratory failure  ?- suspect OHS/OSA ?- on supplemental O2 ?  ?4. Cellulitis ?- per primary team ?  ?5. Anemia ?- appears chronic, macrocytic, with mildly decreased WBC as well ?- Hgb near baseline of 9-10 ?  ?6. Thyroid  disease ?- on levothyroxine at home ?- TSH elevated at 6.7, further management per IM ?  ?TOC f/u arranged Thursday Apr 30, 2021 8:45 AM ?(Per office protocol will hold off on TOC ph call since pt will go to SNF.) ? ?For questions or updates, please contact [place] ?Please consult [email or web address] for contact info under Cardiology/STEMI. ? ?Signed, ?Charlie Pitter, PA-C ?04/23/2021, 9:52 AM   ? ?History and all data above reviewed.  Patient examined.  I agree with the findings as above.  No pain or SOB.  The patient exam reveals [id number]   ,  Lungs:   CTA,  Abd: Positive bowel sounds, no rebound no guarding,  Ext No edema, status post left leg amputation  .  All available labs, radiology testing, previous records reviewed. Agree with documented assessment and plan. Atrial fib:  Dig added yesterday.  Rate control I adequate.   He seems to tolerate a low dose of Torsemide.  I agree with current meds for discharge.   We have arranged TOC follow up for 3/23   Minus Breeding  5:41 PM  04/23/2021 ? ?

## 2021-04-23 NOTE — TOC Progression Note (Addendum)
Transition of Care (TOC) - Progression Note  ? ? ?Patient Details  ?Name: [name] ?MRN: [id number] ?Date of Birth: 09-14-1950 ? ?Transition of Care (TOC) CM/SW Contact  ?Day, LCSW ?Phone Number: ?04/23/2021, 10:00 AM ? ?Clinical Narrative:    ? ?[facility] may be able to offer pt bed for rehab though pt has an outstanding balance. He would need to pay balance or set up a payment plan with [facility] before they could offer. CSW spoke with pt and he expressed understanding of this. He is willing to discuss payment plan with [facility]. CSW [facility] and their business office will call pt to discuss.  ? ?1130: CSW met with pt to follow up. Pt states he did agree on a plan with [facility] but that they can't take him until he makes his first payment. He states he does not have access to his wallet or checkbook. He is working on reaching out to a friend to assist with the payment/getting his wallet. TOC will continue to follow.  ? ?CSW confirmed this with Claiborne Billings at [facility]. Pt can admit tomorrow. Auth started with Ever[facility] B[facility]; Ref# [id number] ? ?Expected Discharge Plan: [place] ?Barriers to Discharge: Ship broker, SNF Pending bed offer ? ?Expected Discharge Plan and Services ?Expected Discharge Plan: [place] ?  ?Discharge Planning Services: CM Consult ?Post Acute Care Choice: [place] ?Living arrangements for the past 2 months: [place] ?                ?  ?  ?  ?  ?  ?  ?  ?  ?  ?  ? ? ?Social Determinants of Health (SDOH) Interventions ?  ? ?Readmission Risk Interventions ?Readmission Risk Prevention Plan 11/09/2019  ?Transportation Screening Complete  ?PCP or Specialist Appt within 5-7 Days Complete  ?Home Care Screening Complete  ?Medication Review (RN CM) Complete  ?Some recent data might be hidden  ? ? ?

## 2021-04-23 NOTE — Progress Notes (Signed)
Physical Therapy Treatment ?Patient Details ?Name: [name] ?MRN: [id number] ?DOB: [birth year] ?Today's Date: 04/23/2021 ? ? ?History of Present Illness Dozier Berkovich is a 71 y.o. male presents with increased edema to his perineum and lower abdomen. CT of abdomen/pelvis showed subcutaneous edema in the right inguinal region/perineum with mild skin thickening but no drainable fluid collection or abscess along with scrotal wall thickening with possible hydrocele. 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 ?General Comments: AxO x 3 very familiar from prior admits.  Very motivated and pleasant. oves to tell stories. General bed mobility comments: pt does well with rolling side to side using rails and momentum.  Assisted to EOB same fashion with HOB elevated.  Static sit EOB at Spupervision level (caution avoid sliding out Air mattress)  Placed recliner chair in front to suppor L AKA with arm rest dropped and positioned 1/2 such that R LE was cleared to be placed on floor and scrotum was also clear.  So recliner chair is "facing" pt.General transfer comment: unable to attempting standing due lack of extra help.  pt sat EOB 18 min while waiting for + 2 assist then had to abort due to fatigue and increased scrotal pressure/pain.  Assisted back to supine. ?Pt will need ST Rehab at SNF to regain his prior level of standing and transfering to a wheelchair.    ?PT Comments  ? ?   ?Recommendations for follow up therapy are one component of a multi-disciplinary discharge planning process, led by the attending physician.  Recommendations may be updated based on patient status, additional functional criteria and insurance authorization. ? ?Follow Up Recommendations ? Skilled nursing-short term rehab (<3 hours/day) ?  ?  ?Assistance Recommended at Discharge Frequent or constant Supervision/Assistance  ?Patient can return home with the following Two people to help with walking and/or  transfers;Two people to help with bathing/dressing/bathroom;Assistance with cooking/housework ?  ?Equipment Recommendations ? None recommended by PT  ?  ?Recommendations for Other Services   ? ? ?  ?Precautions / Restrictions Precautions ?Precautions: Fall ?Precaution Comments: scrotal/groin edema ?Restrictions ?Weight Bearing Restrictions: No ?Other Position/Activity Restrictions: L AKA (no prosthesis)  ?  ? ?Mobility ? Bed Mobility ?Overal bed mobility: Needs Assistance ?Bed Mobility: Rolling, Sit to Supine, Supine to Sit ?Rolling: Mod assist, Max assist, +2 for physical assistance, +2 for safety/equipment ?Sidelying to sit: Mod assist, Max assist, +2 for physical assistance, +2 for safety/equipment, HOB elevated ?Supine to sit: Max assist, +2 for physical assistance, +2 for safety/equipment ?  ?  ?General bed mobility comments: pt does well with rolling side to side using rails and momentum.  Assisted to EOB same fashion with HOB elevated.  Static sit EOB at Spupervision level (caution avoid sliding out Air mattress)  Placed recliner chair in front to suppor L AKA with arm rest dropped and positioned 1/2 such that R LE was cleared to be placed on floor and scrotum was also clear.  So recliner chair is "facing" pt. ?  ? ?Transfers ?  ?  ?  ?  ?  ?  ?  ?  ?  ?General transfer comment: unable to attempting standing due lack of extra help.  pt sat EOB 18 min while waiting for + 2 assist then had to abort due to fatigue and increased scrotal pressure/pain.  Assisted back to supine. ?  ? ?Ambulation/Gait ?  ?  ?  ?  ?  ?  ?  ?  ? ? ?  Stairs ?  ?  ?  ?  ?  ? ? ?Wheelchair Mobility ?  ? ?Modified Rankin (Stroke Patients Only) ?  ? ? ?  ?Balance   ?  ?  ?  ?  ?  ?  ?  ?  ?  ?  ?  ?  ?  ?  ?  ?  ?  ?  ?  ? ?  ?Cognition Arousal/Alertness: Awake/alert ?Behavior During Therapy: [facility] for tasks assessed/performed ?Overall Cognitive Status: Within Functional Limits for tasks assessed ?  ?  ?  ?  ?  ?  ?  ?  ?  ?  ?  ?  ?  ?  ?  ?   ?General Comments: AxO x 3 very familiar from prior admits.  Very motivated and pleasant. oves to tell stories. ?  ?  ? ?  ?Exercises   ? ?  ?General Comments   ?  ?  ? ?Pertinent Vitals/Pain Pain Assessment ?Pain Assessment: Faces ?Faces Pain Scale: Hurts a little bit ?Pain Location: scrotum ?Pain Descriptors / Indicators: Burning ?Pain Intervention(s): Monitored during session  ? ? ?Home Living   ?  ?  ?  ?  ?  ?  ?  ?  ?  ?   ?  ?Prior Function    ?  ?  ?   ? ?PT Goals (current goals can now be found in the care plan section) Progress towards PT goals: Progressing toward goals ? ?  ?Frequency ? ? ? Min 2X/week ? ? ? ?  ?PT Plan Current plan remains appropriate  ? ? ?Co-evaluation   ?  ?  ?  ?  ? ?  ?AM-PAC PT "6 Clicks" Mobility   ?Outcome Measure ? Help needed turning from your back to your side while in a flat bed without using bedrails?: Total ?Help needed moving from lying on your back to sitting on the side of a flat bed without using bedrails?: Total ?Help needed moving to and from a bed to a chair (including a wheelchair)?: Total ?Help needed standing up from a chair using your arms (e.g., wheelchair or bedside chair)?: Total ?Help needed to walk in hospital room?: Total ?Help needed climbing 3-5 steps with a railing? : Total ?6 Click Score: 6 ? ?  ?End of Session Equipment Utilized During Treatment: Oxygen;Gait belt ?Activity Tolerance: Patient tolerated treatment well ?Patient left: in bed;with call bell/phone within reach ?Nurse Communication: Mobility status ?PT Visit Diagnosis: Other abnormalities of gait and mobility (R26.89);Muscle weakness (generalized) (M62.81) ?  ? ? ?Time: 8921-1941 ?PT Time Calculation (min) (ACUTE ONLY): 28 min ? ?Charges:  $Therapeutic Activity: 23-37 mins          ?          ? ?.sifn ? ? ?

## 2021-04-24 DIAGNOSIS — L0390 Cellulitis, unspecified: Secondary | ICD-10-CM

## 2021-04-24 DIAGNOSIS — I5043 Acute on chronic combined systolic (congestive) and diastolic (congestive) heart failure: Secondary | ICD-10-CM | POA: Diagnosis not present

## 2021-04-24 NOTE — Progress Notes (Signed)
Patient placed on CPAP at this time with 3 Lpm O2 bled in. Patients vitals stable and patient resting comfortably.  ?

## 2021-04-24 NOTE — Care Management Important Message (Signed)
Important Message ? ?Patient Details IM Letter given to the Patient ?Name: [name] ?MRN: [id number] ?Date of Birth: 1950/05/07 ? ? ?Medicare Important Message Given:  Yes ? ? ? ? ?[name] ?04/24/2021, 10:16 AM ?

## 2021-04-24 NOTE — Progress Notes (Addendum)
Physical Therapy Treatment ?Patient Details ?Name: [name] ?MRN: [id number] ?DOB: 1950-05-11 ?Today's Date: 04/24/2021 ? ? ?History of Present Illness Cletus Paris is a 71 y.o. male presents with increased edema to his perineum and lower abdomen. CT of abdomen/pelvis showed subcutaneous edema in the right inguinal region/perineum with mild skin thickening but no drainable fluid collection or abscess along with scrotal wall thickening with possible hydrocele. 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 ?  ?PT Comments  ? ? General Comments: AxO x 3 very familiar from prior admits.  Very motivated and pleasant. oves to tell stories.General bed mobility comments: pt does well with rolling side to side using rails and momentum.  Assisted to EOB same fashion with HOB elevated.  Static sit EOB at Spupervision level (caution avoid sliding out Air mattress)  Placed recliner chair in front to suppor L AKA with arm rest dropped and positioned 1/2 such that R LE was cleared to be placed on floor and scrotum was also clear.  So recliner chair is "facing" pt. MD entered room mid session.  Pt currently on 3 lts nasal.  MD asking for RA sats.  Lowest with activity was 90%.  General transfer comment: performed sit to stand four times + 2 assist with a sraight back chair + padding (pillows) under pt's L AKA.  Pt was able to static stand and "lock" his R knee to suuport his body weight but he was unable to pivot 1/4 to recliner due to fear and weakness.  Assisted back to bed. Pt declined to use lift to transfer to recliner "bed is more comfortable".   ?Pt plans to D/C to SNF. ? ?SATURATION QUALIFICATIONS: (This note is used to comply with regulatory documentation for home oxygen) ? ?Patient Saturations on Room Air at Rest = 92% ? ?Patient Saturations on Room Air while standing at bedside = 90% ? ?Please briefly explain why patient needs home oxygen: Pt does not require supplemetal oxygen during  activity.  MD would like to know his O2 sats sleeping.  Pt wears a C Pap.   ?  ?Recommendations for follow up therapy are one component of a multi-disciplinary discharge planning process, led by the attending physician.  Recommendations may be updated based on patient status, additional functional criteria and insurance authorization. ? ?Follow Up Recommendations ? Skilled nursing-short term rehab (<3 hours/day) ?  ?  ?Assistance Recommended at Discharge Frequent or constant Supervision/Assistance  ?Patient can return home with the following Two people to help with walking and/or transfers;Two people to help with bathing/dressing/bathroom;Assistance with cooking/housework ?  ?Equipment Recommendations ? None recommended by PT  ?  ?Recommendations for Other Services   ? ? ?  ?Precautions / Restrictions Precautions ?Precautions: Fall ?Precaution Comments: scrotal/groin edema dicomfort ?Restrictions ?Weight Bearing Restrictions: No ?Other Position/Activity Restrictions: L AKA (no prosthesis)  ?  ? ?Mobility ? Bed Mobility ?Overal bed mobility: Needs Assistance ?Bed Mobility: Rolling, Sit to Supine, Supine to Sit ?Rolling: Mod assist, +2 for physical assistance, +2 for safety/equipment ?  ?Supine to sit: Max assist, +2 for physical assistance, +2 for safety/equipment ?  ?  ?General bed mobility comments: pt does well with rolling side to side using rails and momentum.  Assisted to EOB same fashion with HOB elevated.  Static sit EOB at Spupervision level (caution avoid sliding out Air mattress)  Placed recliner chair in front to suppor L AKA with arm rest dropped and positioned 1/2 such that R LE  was cleared to be placed on floor and scrotum was also clear.  So recliner chair is "facing" pt. ?  ? ?Transfers ?Overall transfer level: Needs assistance ?  ?Transfers: Sit to/from Stand ?Sit to Stand: Max assist ?  ?  ?  ?  ?  ?General transfer comment: performed sit to stand four times + 2 assist with a sraight back chair +  padding (pillows) under pt's L AKA.  Pt was able to static stand and "lock" his R knee to suuport his body weight but he was unable to pivot 1/4 to recliner due to fear and weakness.  Assisted back to bed. ?  ? ?Ambulation/Gait ?  ?  ?  ?  ?  ?  ?  ?  ? ? ?Stairs ?  ?  ?  ?  ?  ? ? ?Wheelchair Mobility ?  ? ?Modified Rankin (Stroke Patients Only) ?  ? ? ?  ?Balance   ?  ?  ?  ?  ?  ?  ?  ?  ?  ?  ?  ?  ?  ?  ?  ?  ?  ?  ?  ? ?  ?Cognition Arousal/Alertness: Awake/alert ?Behavior During Therapy: [facility] for tasks assessed/performed ?  ?  ?  ?  ?  ?  ?  ?  ?  ?  ?  ?  ?  ?  ?  ?  ?  ?General Comments: AxO x 3 very familiar from prior admits.  Very motivated and pleasant. oves to tell stories. ?  ?  ? ?  ?Exercises   ? ?  ?General Comments   ?  ?  ? ?Pertinent Vitals/Pain Pain Assessment ?Pain Assessment: Faces ?Faces Pain Scale: Hurts a little bit ?Pain Location: scrotum/positional ?Pain Descriptors / Indicators: Discomfort ?Pain Intervention(s): Monitored during session  ? ? ?Home Living   ?  ?  ?  ?  ?  ?  ?  ?  ?  ?   ?  ?Prior Function    ?  ?  ?   ? ?PT Goals (current goals can now be found in the care plan section) Progress towards PT goals: Progressing toward goals ? ?  ?Frequency ? ? ? Min 2X/week ? ? ? ?  ?PT Plan Current plan remains appropriate  ? ? ?Co-evaluation   ?  ?  ?  ?  ? ?  ?AM-PAC PT "6 Clicks" Mobility   ?Outcome Measure ? Help needed turning from your back to your side while in a flat bed without using bedrails?: A Lot ?Help needed moving from lying on your back to sitting on the side of a flat bed without using bedrails?: A Lot ?Help needed moving to and from a bed to a chair (including a wheelchair)?: A Lot ?Help needed standing up from a chair using your arms (e.g., wheelchair or bedside chair)?: A Lot ?Help needed to walk in hospital room?: Total ?Help needed climbing 3-5 steps with a railing? : Total ?6 Click Score: 10 ? ?  ?End of Session Equipment Utilized During Treatment: Gait  belt ?Activity Tolerance: Patient tolerated treatment well ?Patient left: in bed;with call bell/phone within reach ?Nurse Communication: Mobility status ?PT Visit Diagnosis: Other abnormalities of gait and mobility (R26.89);Muscle weakness (generalized) (M62.81) ?  ? ? ?Time: 2956-2130 ?PT Time Calculation (min) (ACUTE ONLY): 26 min ? ?Charges:  $Therapeutic Activity: 23-37 mins          ?          ? ?{  Rica Koyanagi  PTA ?Acute  Rehabilitation Services ?Pager      [phone] ?Office      [phone] ? ?

## 2021-04-24 NOTE — Plan of Care (Signed)

## 2021-04-24 NOTE — Progress Notes (Addendum)
? ? ?Progress Note ? ?Patient Name: [name] ?Date of Encounter: 04/24/2021 ? ?[facility] Cardiologist: Sanda Klein, MD  ? ?Subjective  ? ?Breathing is stable. No chest pain ? ?Inpatient Medications  ?  ?Scheduled Meds: ? (feeding supplement) PROSource Plus  30 mL Oral TID BM  ? apixaban  5 mg Oral BID  ? vitamin C  500 mg Oral Daily  ? clotrimazole   Topical BID  ? digoxin  0.125 mg Oral Daily  ? gabapentin  800 mg Oral TID  ? levothyroxine  125 mcg Oral Q0600  ? Living Better with Heart Failure Book   Does not apply Once  ? losartan  25 mg Oral Daily  ? mouth rinse  15 mL Mouth Rinse BID  ? metoprolol succinate  100 mg Oral Daily  ? multivitamin with minerals  1 tablet Oral Daily  ? nutrition supplement (JUVEN)  1 packet Oral BID WC  ? torsemide  20 mg Oral Daily  ? vitamin B-12  1,000 mcg Oral Daily  ? ?Continuous Infusions: ? ?PRN Meds: ?acetaminophen **OR** acetaminophen, methocarbamol  ? ?Vital Signs  ?  ?Vitals:  ? 04/23/21 0427 04/23/21 1951 04/23/21 2341 04/24/21 0340  ?BP: 100/68 99/74  (!) 89/72  ?Pulse: 86 99 88 83  ?Resp: '20 18 18 18  '$ ?Temp: 97.6 ?F (36.4 ?C) 98.2 ?F (36.8 ?C)  97.6 ?F (36.4 ?C)  ?TempSrc: Oral Oral  Oral  ?SpO2: 96% 92% 92% 96%  ?Weight:      ?Height:      ? ? ?Intake/Output Summary (Last 24 hours) at 04/24/2021 1002 ?Last data filed at 04/24/2021 0846 ?Gross per 24 hour  ?Intake 1320 ml  ?Output 2650 ml  ?Net -1330 ml  ? ?Last 3 Weights 04/22/2021 04/20/2021 04/19/2021  ?Weight (lbs) 463 lb 13.6 oz 477 lb (No Data)  ?Weight (kg) 210.4 kg 216.366 kg (No Data)  ?   ? ?Telemetry  ?  ?Atrial fibrillation with HR low 90s - Personally Reviewed ? ?ECG  ?  ?Atrial fibrillation with HR 80s - Personally Reviewed ? ?Physical Exam  ? ?GEN: No acute distress.   ?Neck: No JVD ?Cardiac: irregularly irregular, no murmurs, rubs, or gallops.  ?Respiratory: Clear to auscultation bilaterally. ?GI: Soft, nontender, non-distended  ?MS: No edema; No deformity. ?Neuro:  Nonfocal  ?Psych: Normal  affect  ? ?Labs  ?  ?High Sensitivity Troponin:   ?Recent Labs  ?Lab 04/13/21 ?0800 04/13/21 ?1215  ?TROPONINIHS 6 7  ?   ?Chemistry ?Recent Labs  ?Lab 04/21/21 ?6962 04/22/21 ?9528 04/23/21 ?0506  ?NA 138 139 139  ?K 3.8 3.8 3.9  ?CL 92* 92* 96*  ?CO2 36* 39* 33*  ?GLUCOSE 102* 106* 106*  ?BUN 44* 47* 42*  ?CREATININE 0.78 0.96 0.94  ?CALCIUM 9.9 10.2 9.8  ?MG 2.5* 2.4 2.2  ?GFRNONAA >60 >60 >60  ?ANIONGAP '10 8 10  '$ ?  ?Lipids No results for input(s): CHOL, TRIG, HDL, LABVLDL, LDLCALC, CHOLHDL in the last 168 hours.  ?Hematology ?Recent Labs  ?Lab 04/21/21 ?4132  ?WBC 3.5*  ?RBC 3.69*  ?HGB 11.2*  ?HCT 38.9*  ?MCV 105.4*  ?MCH 30.4  ?MCHC 28.8*  ?RDW 16.9*  ?PLT 181  ? ?Thyroid  ?Recent Labs  ?Lab 04/21/21 ?4401  ?TSH 6.712*  ?  ?BNPNo results for input(s): BNP, PROBNP in the last 168 hours.  ?DDimer No results for input(s): DDIMER in the last 168 hours.  ? ?Radiology  ?  ?No results found. ? ?Cardiac  Studies  ? ?Echo 04/14/2021 ? 1. Left ventricular ejection fraction, by estimation, is 45 to 50%. The  ?left ventricle has mildly decreased function. The left ventricle  ?demonstrates global hypokinesis. Left ventricular diastolic function could  ?not be evaluated.  ? 2. Right ventricular systolic function was not well visualized. The right  ?ventricular size is moderately enlarged. There is moderately elevated  ?pulmonary artery systolic pressure.  ? 3. Left atrial size was moderately dilated.  ? 4. Right atrial size was severely dilated.  ? 5. The mitral valve is grossly normal. Trivial mitral valve  ?regurgitation. No evidence of mitral stenosis.  ? 6. The aortic valve was not well visualized. Aortic valve regurgitation  ?is not visualized. No aortic stenosis is present.  ? 7. Pulmonic valve regurgitation not visualized.  ? 8. The inferior vena cava is dilated in size with <50% respiratory  ?variability, suggesting right atrial pressure of 15 mmHg.  ? ?Comparison(s): Prior images reviewed side by side. Visually  studies appear  ?similar when viewed side by side.  ? ?Conclusion(s)/Recommendation(s): Technically challenging images with  ?limited windows, recommend echo contrast for future studies. EF visually  ?unchanged from prior, though appears mildly abnormal on both studies. RV  ?appears enlarged but cannot be well  ?seen. IVC very dilated and does not collapse.  ? ?Patient Profile  ?   ?71 y.o. male with PMH of permanent atrial fibrillation, chronic combined CHF, recurrent VTE, super morbid obesity, OSA on CPAP, L AKA 2021, chronic hypoxic resp failure on 4L home O2 at SNF, followed by palliative medicine but full code. He presented with R groin rash, scrotal swelling and increased erythema. He was confused on admission.  ? ?Assessment & Plan  ?  ?Acute on chronic combined CHF ? - Echo 04/14/2021 EF 45-50%, RV was not well visualized, moderate LAE, severe RAE ? - I/O -24L. Weight is not accurate. Continue current dose of torsemide ? - cardiology can sign off ? ?Permanent atrial fibrillation ? - HR controlled on metoprolol succinate and digoxin. Continue Eliquis ? ?Chronic hypoxic respiratory failure: breathing stable ? ?Cellulitis: treated with abx ? ?Anemia ? ?Hypothyroidism ? ? ?For questions or updates, please contact [place] ?Please consult [email or web address] for contact info under  ? ?  ?   ?Signed, ?Almyra Deforest, [place]  ?04/24/2021, 10:02 AM   ? ?History and all data above reviewed.  Patient examined.  Denies pain or SOB. Working with PT.  Can stand a little bit.  I agree with the findings as above.  The patient exam reveals [id number]   ,  Lungs:  Clear  ,  Abd: Positive bowel sounds, no rebound no guarding, Ext No edema   .  All available labs, radiology testing, previous records reviewed. Agree with documented assessment and plan.  Atrial fib:  Rate OK.  No change in therapy.  Of note his sats were 95% RA when working with PT.  Needs overnight pulse ox to see if he drops on RA at night.  Need to understand if he  needs to go out on O2.  I will defer to the primary team.  We will sign off.  Follow up arranged.   Jeneen Rinks Asjia Berrios  12:01 PM  04/24/2021 ? ?

## 2021-04-24 NOTE — TOC Progression Note (Addendum)
Transition of Care (TOC) - Progression Note  ? ? ?Patient Details  ?Name: [name] ?MRN: [id number] ?Date of Birth: [birth year] ? ?Transition of Care (TOC) CM/SW Contact  ?Ross Ludwig, LCSW ?Phone Number: ?04/24/2021, 4:59 PM ? ?Clinical Narrative:    ? ?CSW received phone call from [employer] [facility], that they are offering a peer to peer.  Per insurance company, deadline Monday March 20th, by 11:30am [place] time.  Attending physician to call [phone] option 5 and to provide patient's date of birth and subscriber number which is [phone].  CSW updated attending physician and Claiborne Billings at [facility] attempted to update patient however, not able to get a hold of him. ? ?Expected Discharge Plan: [place] ?Barriers to Discharge: Ship broker, SNF Pending bed offer ? ?Expected Discharge Plan and Services ?Expected Discharge Plan: [place] ?  ?Discharge Planning Services: CM Consult ?Post Acute Care Choice: [place] ?Living arrangements for the past 2 months: [place] ?                ?  ?  ?  ?  ?  ?  ?  ?  ?  ?  ? ? ?Social Determinants of Health (SDOH) Interventions ?  ? ?Readmission Risk Interventions ?Readmission Risk Prevention Plan 11/09/2019  ?Transportation Screening Complete  ?PCP or Specialist Appt within 5-7 Days Complete  ?Home Care Screening Complete  ?Medication Review (RN CM) Complete  ?Some recent data might be hidden  ? ? ?

## 2021-04-24 NOTE — Progress Notes (Signed)
?[facility] Progress Note ? ?Patient: [name]     ?IWL:[id number]  ?DOA: 04/13/2021   ?  ?  ?Brief hospital course: ?71 y.o. male who lives in assisted living and has Afib, Hx of DVT, Left AKA, morbid obesity,chronic hypoxic respiratory failure on 4L [place], OSA chronic diastolic HF, hypothyroidism, intermittent scrotal and groin swelling for several months requiring multiple ED visits presented with increased shortness of breath along with groin swelling and redness.   ? ?CT of abdomen/pelvis showed subcutaneous edema in the right inguinal region/perineum with mild skin thickening but no drainable fluid collection or abscess along with scrotal wall thickening with possible hydrocele.  Treated for scrotal cellulitis and acute on chronic heart failure. Now awaiting SNF.  ? ?Subjective:  ?He had trouble sleeping last night but he does nap during the day and I have explained that his sleep wake cycle is likely disturbed. No other complaints.  ? ?Assessment and Plan: ?Cellulitis of groin, right ?Scrotal cellulitis ?-CT imaging as above.  Scrotal ultrasound also showed mild scrotal edema along with possible complex hydroceles ?--Urology felt he may have had early cellulitis and fluid overload ?- received 10 days of antibiotics up until 3/15 ? ?Acute on chronic combined systolic and diastolic CHF (congestive heart failure) ([place]) ? -Echo showed EF of 45 to 50% with global hypokinesis.   ?- IV Lasix  transitioned to Torsemide on 3/12 ?- cont Losartan and Toprol ? ?Chronic respiratory failure with hypoxia (HCC) ?- question if this is related to OHS ?-Normally uses 3 to 4 L oxygen via nasal cannula during daytime and CPAP at night.    ? ?Hypothyroidism ?- Continue Synthroid ? ?OSA (obstructive sleep apnea) ?- cont CPAP ? ?Persistent atrial fibrillation ([place]) ?-Continue Toprol and Eliquis ?- Dig added on 3/15 - rates improved ? ?Morbid obesity with BMI of 60.0-69.9, adult ([place]) ?-Body mass index is 70.53 kg/m?.   ?-  Most recent weight is 210 kg ?  ? ?Physical deconditioning ?- PT recommends SNF placement.    ?  ? ?Pressure injury of skin ?Stage II pressure injury right posterior thigh: Present on admission ?-Continue local wound care ? ? ? ? ? ?DVT prophylaxis:   ?apixaban (ELIQUIS) tablet 5 mg   ?  Code Status: Full Code  ?Level of Care: Level of care: Telemetry ?Disposition Plan:  ?Status is: Inpatient ?Remains inpatient appropriate because: awaiting SNF ? ?Objective: ?  ?Vitals:  ? 04/23/21 1951 04/23/21 2341 04/24/21 0340 04/24/21 1439  ?BP: 99/74  (!) 89/72 99/73  ?Pulse: 99 88 83 95  ?Resp: '18 18 18 19  '$ ?Temp: 98.2 ?F (36.8 ?C)  97.6 ?F (36.4 ?C) 98.6 ?F (37 ?C)  ?TempSrc: Oral  Oral Oral  ?SpO2: 92% 92% 96% 92%  ?Weight:      ?Height:      ? ?Filed Weights  ? 04/17/21 0500 04/20/21 0935 04/22/21 1300  ?Weight: (!) 183.7 kg (!) 216.4 kg (!) 210.4 kg  ? ?Exam: ?General exam: Appears comfortable  ?HEENT: PERRLA, oral mucosa moist, no sclera icterus or thrush ?Respiratory system: Clear to auscultation. Respiratory effort normal. ?Cardiovascular system: S1 & S2 heard, regular rate and rhythm ?Gastrointestinal system: Abdomen soft, non-tender, nondistended. Normal bowel sounds   ?Central nervous system: Alert and oriented. No focal neurological deficits. ?Extremities: No cyanosis, clubbing or edema ?Skin: No rashes or ulcers ?Psychiatry:  Mood & affect appropriate.   ? ?Imaging and lab data was personally reviewed ? ? ? CBC: ?Recent Labs  ?Lab 04/21/21 ?  0444  ?WBC 3.5*  ?HGB 11.2*  ?HCT 38.9*  ?MCV 105.4*  ?PLT 181  ? ?Basic Metabolic Panel: ?Recent Labs  ?Lab 04/19/21 ?4235 04/20/21 ?3614 04/21/21 ?4315 04/22/21 ?4008 04/23/21 ?0506  ?NA 141 140 138 139 139  ?K 3.6 3.7 3.8 3.8 3.9  ?CL 93* 92* 92* 92* 96*  ?CO2 40* 39* 36* 39* 33*  ?GLUCOSE 99 97 102* 106* 106*  ?BUN 21 29* 44* 47* 42*  ?CREATININE 0.70 0.84 0.78 0.96 0.94  ?CALCIUM 9.9 9.7 9.9 10.2 9.8  ?MG 2.5* 2.4 2.5* 2.4 2.2  ? ?GFR: ?Estimated Creatinine Clearance:  129.5 mL/min (by C-G formula based on SCr of 0.94 mg/dL). ? ?Scheduled Meds: ? (feeding supplement) PROSource Plus  30 mL Oral TID BM  ? apixaban  5 mg Oral BID  ? vitamin C  500 mg Oral Daily  ? clotrimazole   Topical BID  ? digoxin  0.125 mg Oral Daily  ? gabapentin  800 mg Oral TID  ? levothyroxine  125 mcg Oral Q0600  ? Living Better with Heart Failure Book   Does not apply Once  ? losartan  25 mg Oral Daily  ? mouth rinse  15 mL Mouth Rinse BID  ? metoprolol succinate  100 mg Oral Daily  ? multivitamin with minerals  1 tablet Oral Daily  ? nutrition supplement (JUVEN)  1 packet Oral BID WC  ? torsemide  20 mg Oral Daily  ? vitamin B-12  1,000 mcg Oral Daily  ? ?Continuous Infusions: ? ? LOS: 11 days  ? ?Author: ?Debbe Odea  ?04/24/2021 3:14 PM ?   ?

## 2021-04-25 DIAGNOSIS — L0390 Cellulitis, unspecified: Secondary | ICD-10-CM | POA: Diagnosis not present

## 2021-04-25 NOTE — Plan of Care (Signed)
Tolerating CPAP overnight.  ?Problem: Education: ?Goal: Knowledge of General Education information will improve ?Description: Including pain rating scale, medication(s)/side effects and non-pharmacologic comfort measures ?Outcome: Progressing ?  ?Problem: Clinical Measurements: ?Goal: Ability to maintain clinical measurements within normal limits will improve ?Outcome: Progressing ?Goal: Will remain free from infection ?Outcome: Progressing ?Goal: Respiratory complications will improve ?Outcome: Progressing ?  ?Problem: Activity: ?Goal: Risk for activity intolerance will decrease ?Outcome: Progressing ?  ?Problem: Coping: ?Goal: Level of anxiety will decrease ?Outcome: Progressing ?  ?Problem: Elimination: ?Goal: Will not experience complications related to bowel motility ?Outcome: Progressing ?  ?Problem: Pain Managment: ?Goal: General experience of comfort will improve ?Outcome: Progressing ?  ?

## 2021-04-25 NOTE — Progress Notes (Signed)
?[facility] Progress Note ? ?Patient: [name]     ?IRJ:[id number]  ?DOA: 04/13/2021   ?  ?  ?Brief hospital course: ?71 y.o. male who lives in assisted living and has Afib, Hx of DVT, Left AKA, morbid obesity,chronic hypoxic respiratory failure on 4L [place], OSA chronic diastolic HF, hypothyroidism, intermittent scrotal and groin swelling for several months requiring multiple ED visits presented with increased shortness of breath along with groin swelling and redness.   ? ?CT of abdomen/pelvis showed subcutaneous edema in the right inguinal region/perineum with mild skin thickening but no drainable fluid collection or abscess along with scrotal wall thickening with possible hydrocele.  Treated for scrotal cellulitis and acute on chronic heart failure. Now awaiting SNF.  ? ?Subjective:  ?No new complaints.  ? ?Assessment and Plan: ?Cellulitis of groin, right ?Scrotal cellulitis ?-CT imaging as above.  Scrotal ultrasound also showed mild scrotal edema along with possible complex hydroceles ?--Urology felt he may have had early cellulitis and fluid overload ?- received 10 days of antibiotics up until 3/15 ? ?Acute on chronic combined systolic and diastolic CHF (congestive heart failure) ([place]) ? -Echo showed EF of 45 to 50% with global hypokinesis.   ?- IV Lasix  transitioned to Torsemide on 3/12 ?- cont Losartan and Toprol ? ?Chronic respiratory failure with hypoxia (HCC) ?- question if this is related to OHS ?-Normally uses 3 to 4 L oxygen via nasal cannula during daytime and CPAP at night.    ? ?Hypothyroidism ?- Continue Synthroid ? ?OSA (obstructive sleep apnea) ?- cont CPAP ? ?Persistent atrial fibrillation ([place]) ?-Continue Toprol and Eliquis ?- Dig added on 3/15 - rates improved ? ?Morbid obesity with BMI of 60.0-69.9, adult ([place]) ?-Body mass index is 70.53 kg/m?.  ?-  Most recent weight is 210 kg ?  ? ?Physical deconditioning ?- PT recommends SNF placement.    ?  ? ?Pressure injury of skin ?Stage II  pressure injury right posterior thigh: Present on admission ?-Continue local wound care ? ? ? ? ? ?DVT prophylaxis:   ?apixaban (ELIQUIS) tablet 5 mg   ?  Code Status: Full Code  ?Level of Care: Level of care: Telemetry ?Disposition Plan:  ?Status is: Inpatient ?Remains inpatient appropriate because: awaiting SNF ? ?Objective: ?  ?Vitals:  ? 04/24/21 1439 04/24/21 2048 04/25/21 0514 04/25/21 1406  ?BP: 99/73 99/66 104/78 (!) 106/59  ?Pulse: 95 86 85 91  ?Resp: '19 20 17 16  '$ ?Temp: 98.6 ?F (37 ?C) 98.5 ?F (36.9 ?C) 97.6 ?F (36.4 ?C) 97.6 ?F (36.4 ?C)  ?TempSrc: Oral Oral Oral   ?SpO2: 92% 93% 95% 93%  ?Weight:      ?Height:      ? ?Filed Weights  ? 04/17/21 0500 04/20/21 0935 04/22/21 1300  ?Weight: (!) 183.7 kg (!) 216.4 kg (!) 210.4 kg  ? ?Exam: ?General exam: Appears comfortable  ?HEENT: PERRLA, oral mucosa moist, no sclera icterus or thrush ?Respiratory system: Clear to auscultation. Respiratory effort normal. ?Cardiovascular system: S1 & S2 heard, regular rate and rhythm ?Gastrointestinal system: Abdomen soft, non-tender, nondistended. Normal bowel sounds   ?Central nervous system: Alert and oriented. No focal neurological deficits. ?Extremities: No cyanosis, clubbing or edema ?Skin: No rashes or ulcers ?Psychiatry:  Mood & affect appropriate.   ? ?Imaging and lab data was personally reviewed ? ? ? CBC: ?Recent Labs  ?Lab 04/21/21 ?3016  ?WBC 3.5*  ?HGB 11.2*  ?HCT 38.9*  ?MCV 105.4*  ?PLT 181  ? ? ?Basic Metabolic Panel: ?  Recent Labs  ?Lab 04/19/21 ?4098 04/20/21 ?1191 04/21/21 ?4782 04/22/21 ?9562 04/23/21 ?0506  ?NA 141 140 138 139 139  ?K 3.6 3.7 3.8 3.8 3.9  ?CL 93* 92* 92* 92* 96*  ?CO2 40* 39* 36* 39* 33*  ?GLUCOSE 99 97 102* 106* 106*  ?BUN 21 29* 44* 47* 42*  ?CREATININE 0.70 0.84 0.78 0.96 0.94  ?CALCIUM 9.9 9.7 9.9 10.2 9.8  ?MG 2.5* 2.4 2.5* 2.4 2.2  ? ? ?GFR: ?Estimated Creatinine Clearance: 129.5 mL/min (by C-G formula based on SCr of 0.94 mg/dL). ? ?Scheduled Meds: ? (feeding supplement) PROSource  Plus  30 mL Oral TID BM  ? apixaban  5 mg Oral BID  ? vitamin C  500 mg Oral Daily  ? clotrimazole   Topical BID  ? digoxin  0.125 mg Oral Daily  ? gabapentin  800 mg Oral TID  ? levothyroxine  125 mcg Oral Q0600  ? Living Better with Heart Failure Book   Does not apply Once  ? losartan  25 mg Oral Daily  ? mouth rinse  15 mL Mouth Rinse BID  ? metoprolol succinate  100 mg Oral Daily  ? multivitamin with minerals  1 tablet Oral Daily  ? nutrition supplement (JUVEN)  1 packet Oral BID WC  ? torsemide  20 mg Oral Daily  ? vitamin B-12  1,000 mcg Oral Daily  ? ?Continuous Infusions: ? ? LOS: 12 days  ? ?Author: ?Debbe Odea  ?04/25/2021 4:46 PM ?   ?

## 2021-04-26 DIAGNOSIS — L0390 Cellulitis, unspecified: Secondary | ICD-10-CM | POA: Diagnosis not present

## 2021-04-26 NOTE — Progress Notes (Addendum)
?[facility] Progress Note ? ?Patient: [name]     ?VCB:[id number]  ?DOA: 04/13/2021   ?  ?  ?Brief hospital course: ?71 y.o. male who lives in assisted living and has Afib, Hx of DVT, Left AKA, morbid obesity,chronic hypoxic respiratory failure on 4L [place], OSA chronic diastolic HF, hypothyroidism, intermittent scrotal and groin swelling for several months requiring multiple ED visits presented with increased shortness of breath along with groin swelling and redness.   ? ?CT of abdomen/pelvis showed subcutaneous edema in the right inguinal region/perineum with mild skin thickening but no drainable fluid collection or abscess along with scrotal wall thickening with possible hydrocele.  Treated for scrotal cellulitis and acute on chronic heart failure. Now awaiting SNF.  ? ?Subjective:  ?No complaints.  ? ?Assessment and Plan: ?Cellulitis of groin, right ?Scrotal cellulitis ?-CT imaging as above.  Scrotal ultrasound also showed mild scrotal edema along with possible complex hydroceles ?--Urology felt he may have had early cellulitis and fluid overload ?- received 10 days of antibiotics up until 3/15 ? ?Acute on chronic combined systolic and diastolic CHF (congestive heart failure) ([place]) ? -Echo showed EF of 45 to 50% with global hypokinesis.   ?- IV Lasix  transitioned to Torsemide on 3/12 ?- cont Losartan and Toprol ? ?Chronic respiratory failure with hypoxia (HCC) ?- question if this is related to OHS ?-Normally uses 3 to 4 L oxygen via nasal cannula during daytime and CPAP at night.    ? ?Hypothyroidism ?- Continue Synthroid ? ?OSA (obstructive sleep apnea) ?- cont CPAP ? ?Persistent atrial fibrillation ([place]) ?-Continue Toprol and Eliquis ?- Dig added on 3/15 - rates improved ? ?Morbid obesity with BMI of 60.0-69.9, adult ([place]) ?-Body mass index is 70.53 kg/m?.  ?-  Most recent weight is 210 kg ?  ? ?Physical deconditioning ?- PT recommends SNF placement.    ?  ? ?Pressure injury of skin ?Stage II  pressure injury right posterior thigh: Present on admission ?-Continue local wound care ? ? ?Peer to peer completed. [employer] denied to pay for rehab. ? ? ?DVT prophylaxis:   ?apixaban (ELIQUIS) tablet 5 mg   ?  Code Status: Full Code  ?Level of Care: Level of care: Telemetry ?Disposition Plan:  ?Status is: Inpatient ?Remains inpatient appropriate because: awaiting SNF ? ?Objective: ?  ?Vitals:  ? 04/25/21 0514 04/25/21 1406 04/25/21 2004 04/26/21 0514  ?BP: 104/78 (!) 106/59 97/68 117/78  ?Pulse: 85 91 70 84  ?Resp: '17 16 20 20  '$ ?Temp: 97.6 ?F (36.4 ?C) 97.6 ?F (36.4 ?C) (!) 97.4 ?F (36.3 ?C) 97.6 ?F (36.4 ?C)  ?TempSrc: Oral  Oral Oral  ?SpO2: 95% 93% 95% 93%  ?Weight:      ?Height:      ? ?Filed Weights  ? 04/17/21 0500 04/20/21 0935 04/22/21 1300  ?Weight: (!) 183.7 kg (!) 216.4 kg (!) 210.4 kg  ? ?Exam: ?General exam: Appears comfortable  ?HEENT: PERRLA, oral mucosa moist, no sclera icterus or thrush ?Respiratory system: Clear to auscultation. Respiratory effort normal. ?Cardiovascular system: S1 & S2 heard, regular rate and rhythm ?Gastrointestinal system: Abdomen soft, non-tender, nondistended. Normal bowel sounds   ?Central nervous system: Alert and oriented. No focal neurological deficits. ?Extremities: No cyanosis, clubbing or edema- left BKA ?Skin: No rashes or ulcers ?Psychiatry:  Mood & affect appropriate.   ? ?Imaging and lab data was personally reviewed ? ? ? CBC: ?Recent Labs  ?Lab 04/21/21 ?3846  ?WBC 3.5*  ?HGB 11.2*  ?HCT 38.9*  ?  MCV 105.4*  ?PLT 181  ? ? ?Basic Metabolic Panel: ?Recent Labs  ?Lab 04/20/21 ?0412 04/21/21 ?3154 04/22/21 ?0086 04/23/21 ?0506  ?NA 140 138 139 139  ?K 3.7 3.8 3.8 3.9  ?CL 92* 92* 92* 96*  ?CO2 39* 36* 39* 33*  ?GLUCOSE 97 102* 106* 106*  ?BUN 29* 44* 47* 42*  ?CREATININE 0.84 0.78 0.96 0.94  ?CALCIUM 9.7 9.9 10.2 9.8  ?MG 2.4 2.5* 2.4 2.2  ? ? ?GFR: ?Estimated Creatinine Clearance: 129.5 mL/min (by C-G formula based on SCr of 0.94 mg/dL). ? ?Scheduled Meds: ? (feeding  supplement) PROSource Plus  30 mL Oral TID BM  ? apixaban  5 mg Oral BID  ? vitamin C  500 mg Oral Daily  ? clotrimazole   Topical BID  ? digoxin  0.125 mg Oral Daily  ? gabapentin  800 mg Oral TID  ? levothyroxine  125 mcg Oral Q0600  ? Living Better with Heart Failure Book   Does not apply Once  ? losartan  25 mg Oral Daily  ? mouth rinse  15 mL Mouth Rinse BID  ? metoprolol succinate  100 mg Oral Daily  ? multivitamin with minerals  1 tablet Oral Daily  ? nutrition supplement (JUVEN)  1 packet Oral BID WC  ? torsemide  20 mg Oral Daily  ? vitamin B-12  1,000 mcg Oral Daily  ? ?Continuous Infusions: ? ? LOS: 13 days  ? ?Author: ?Debbe Odea  ?04/26/2021 1:16 PM ?   ?

## 2021-04-27 DIAGNOSIS — L0390 Cellulitis, unspecified: Secondary | ICD-10-CM | POA: Diagnosis not present

## 2021-04-27 NOTE — Plan of Care (Addendum)
Awaiting on TOC to assist with insurance denial of SNF. I did a P2P this weekend. Patient wishes to appeal the decision.  ? ?Debbe Odea, MD ?

## 2021-04-27 NOTE — Progress Notes (Signed)
?[facility] Progress Note ? ?Patient: [name]     ?JOA:[id number]  ?DOA: 04/13/2021   ?  ?  ?Brief hospital course: ?71 y.o. male who lives in assisted living and has Afib, Hx of DVT, Left AKA, morbid obesity,chronic hypoxic respiratory failure on 4L [place], OSA chronic diastolic HF, hypothyroidism, intermittent scrotal and groin swelling for several months requiring multiple ED visits presented with increased shortness of breath along with groin swelling and redness.   ? ?CT of abdomen/pelvis showed subcutaneous edema in the right inguinal region/perineum with mild skin thickening but no drainable fluid collection or abscess along with scrotal wall thickening with possible hydrocele.  Treated for scrotal cellulitis and acute on chronic heart failure. Now awaiting SNF.  ? ?Subjective:  ?He has no complaints.  ? ?Assessment and Plan: ?Cellulitis of groin, right ?Scrotal cellulitis ?-CT imaging as above.  Scrotal ultrasound also showed mild scrotal edema along with possible complex hydroceles ?--Urology felt he may have had early cellulitis and fluid overload ?- received 10 days of antibiotics up until 3/15 ? ?Acute on chronic combined systolic and diastolic CHF (congestive heart failure) ([place]) ? -Echo showed EF of 45 to 50% with global hypokinesis.   ?- IV Lasix  transitioned to Torsemide on 3/12 ?- cont Losartan and Toprol ? ?Chronic respiratory failure with hypoxia (HCC) ?- question if this is related to OHS ?-Normally uses 3 to 4 L oxygen via nasal cannula during daytime and CPAP at night.    ? ?Hypothyroidism ?- Continue Synthroid ? ?OSA (obstructive sleep apnea) ?- cont CPAP ? ?Persistent atrial fibrillation ([place]) ?-Continue Toprol and Eliquis ?- Dig added on 3/15 - rates improved ? ?Morbid obesity with BMI of 60.0-69.9, adult ([place]) ?-Body mass index is 70.53 kg/m?.  ?-  Most recent weight is 210 kg ?  ? ?Physical deconditioning ?- PT recommends SNF placement.    ?  ? ?Pressure injury of skin ?Stage  II pressure injury right posterior thigh: Present on admission ?-Continue local wound care ? ? ?Peer to peer completed over the weekend. [employer] denied to pay for rehab. ? ? ?DVT prophylaxis:   ?apixaban (ELIQUIS) tablet 5 mg   ?  Code Status: Full Code  ?Level of Care: Level of care: Telemetry ?Disposition Plan:  ?Status is: Inpatient ?Remains inpatient appropriate because: awaiting disposition ? ?Objective: ?  ?Vitals:  ? 04/26/21 0514 04/26/21 1527 04/26/21 2030 04/27/21 0509  ?BP: 117/78 (!) 130/96 100/81 106/77  ?Pulse: 84 91 88 86  ?Resp: '20 17 18 20  '$ ?Temp: 97.6 ?F (36.4 ?C) 98.3 ?F (36.8 ?C) 98.2 ?F (36.8 ?C) (!) 97.4 ?F (36.3 ?C)  ?TempSrc: Oral Oral Oral Oral  ?SpO2: 93% 95% 96% 95%  ?Weight:      ?Height:      ? ?Filed Weights  ? 04/17/21 0500 04/20/21 0935 04/22/21 1300  ?Weight: (!) 183.7 kg (!) 216.4 kg (!) 210.4 kg  ? ?Exam: ?General exam: Appears comfortable  ?HEENT: PERRLA, oral mucosa moist, no sclera icterus or thrush ?Respiratory system: Clear to auscultation. Respiratory effort normal. ?Cardiovascular system: S1 & S2 heard, regular rate and rhythm ?Gastrointestinal system: Abdomen soft, non-tender, nondistended. Normal bowel sounds   ?Central nervous system: Alert and oriented. No focal neurological deficits. ?Extremities: No cyanosis, clubbing or edema ?Skin: No rashes or ulcers ?Psychiatry:  Mood & affect appropriate.   ? ?Imaging and lab data was personally reviewed ? ? ? CBC: ?Recent Labs  ?Lab 04/21/21 ?6010  ?WBC 3.5*  ?HGB 11.2*  ?  HCT 38.9*  ?MCV 105.4*  ?PLT 181  ? ? ?Basic Metabolic Panel: ?Recent Labs  ?Lab 04/21/21 ?3709 04/22/21 ?6438 04/23/21 ?0506  ?NA 138 139 139  ?K 3.8 3.8 3.9  ?CL 92* 92* 96*  ?CO2 36* 39* 33*  ?GLUCOSE 102* 106* 106*  ?BUN 44* 47* 42*  ?CREATININE 0.78 0.96 0.94  ?CALCIUM 9.9 10.2 9.8  ?MG 2.5* 2.4 2.2  ? ? ?GFR: ?Estimated Creatinine Clearance: 129.5 mL/min (by C-G formula based on SCr of 0.94 mg/dL). ? ?Scheduled Meds: ? (feeding supplement) PROSource Plus   30 mL Oral TID BM  ? apixaban  5 mg Oral BID  ? vitamin C  500 mg Oral Daily  ? clotrimazole   Topical BID  ? digoxin  0.125 mg Oral Daily  ? gabapentin  800 mg Oral TID  ? levothyroxine  125 mcg Oral Q0600  ? Living Better with Heart Failure Book   Does not apply Once  ? losartan  25 mg Oral Daily  ? mouth rinse  15 mL Mouth Rinse BID  ? metoprolol succinate  100 mg Oral Daily  ? multivitamin with minerals  1 tablet Oral Daily  ? nutrition supplement (JUVEN)  1 packet Oral BID WC  ? torsemide  20 mg Oral Daily  ? vitamin B-12  1,000 mcg Oral Daily  ? ?Continuous Infusions: ? ? LOS: 14 days  ? ?Author: ?Debbe Odea  ?04/27/2021 10:27 AM ?   ?

## 2021-04-27 NOTE — Progress Notes (Signed)
Physical Therapy Treatment ?Patient Details ?Name: [name] ?MRN: [id number] ?DOB: 17-Jun-1950 ?Today's Date: 04/27/2021 ? ? ?History of Present Illness Samaad Hashem is a 71 y.o. male presents with increased edema to his perineum and lower abdomen. CT of abdomen/pelvis showed subcutaneous edema in the right inguinal region/perineum with mild skin thickening but no drainable fluid collection or abscess along with scrotal wall thickening with possible hydrocele. 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 ?  ?PT Comments  ? ? Pt is AxO x 3 and very motivated. Pt is progressing with his side to side rolling at [place] vs Mod Assist and self ability to transition from supine to seated EOB at Mod vs Max Assist.  Pt progressing with his ability to rise and stand.  General transfer comment: pt progressing with his ability to stand and tolerate an increased upright time.  Pt no longer needs support through L AKA on chair.  Uing B UE's and momentum, pt was able to stand SIX TIMES and maintain an upright posture at greatest duration of 40 seconds.  Pt's goal is to be able to stand long enought to "get into my wheelchair". ?Pt will need ST Rehab at SNF to achieve his transfer goal prior to safely returning to ALF.    ?Recommendations for follow up therapy are one component of a multi-disciplinary discharge planning process, led by the attending physician.  Recommendations may be updated based on patient status, additional functional criteria and insurance authorization. ? ?Follow Up Recommendations ? Skilled nursing-short term rehab (<3 hours/day) ?  ?  ?Assistance Recommended at Discharge Frequent or constant Supervision/Assistance  ?Patient can return home with the following Two people to help with walking and/or transfers;Two people to help with bathing/dressing/bathroom;Assistance with cooking/housework ?  ?Equipment Recommendations ? None recommended by PT  ?  ?Recommendations for  Other Services   ? ? ?  ?Precautions / Restrictions Precautions ?Precautions: Fall ?Precaution Comments: scrotal/groin edema dicomfort ?Restrictions ?Weight Bearing Restrictions: No ?Other Position/Activity Restrictions: L AKA (no prosthesis)  ?  ? ?Mobility ? Bed Mobility ?Overal bed mobility: Needs Assistance ?Bed Mobility: Rolling, Sit to Supine, Supine to Sit ?Rolling: Min assist, +2 for safety/equipment ?  ?Supine to sit: Max assist ?  ?  ?General bed mobility comments: pt making progress with all bed mobility. ?  ? ?Transfers ?Overall transfer level: Needs assistance ?  ?Transfers: Sit to/from Stand ?Sit to Stand: Min assist, Mod assist ?  ?  ?  ?  ?  ?General transfer comment: pt progressing with his ability to stand and tolerate an increased upright time.  Pt no longer needs support through L AKA on chair.  Uing B UE's and momentum, pt was able to stand SIX TIMES and maintain an upright posture at greatest duration of 40 seconds.  Pt's goal is to be able to stand long enought to "get into my wheelchair". ?  ? ?Ambulation/Gait ?  ?  ?  ?  ?  ?  ?  ?  ? ? ?Stairs ?  ?  ?  ?  ?  ? ? ?Wheelchair Mobility ?  ? ?Modified Rankin (Stroke Patients Only) ?  ? ? ?  ?Balance   ?  ?  ?  ?  ?  ?  ?  ?  ?  ?  ?  ?  ?  ?  ?  ?  ?  ?  ?  ? ?  ?Cognition Arousal/Alertness: Awake/alert ?Behavior  During Therapy: [facility] for tasks assessed/performed ?Overall Cognitive Status: Within Functional Limits for tasks assessed ?  ?  ?  ?  ?  ?  ?  ?  ?  ?  ?  ?  ?  ?  ?  ?  ?General Comments: AxO x 3 very familiar from prior admits.  Very motivated and pleasant. oves to tell stories. ?  ?  ? ?  ?Exercises   ? ?  ?General Comments   ?  ?  ? ?Pertinent Vitals/Pain    ? ? ?Home Living   ?  ?  ?  ?  ?  ?  ?  ?  ?  ?   ?  ?Prior Function    ?  ?  ?   ? ?PT Goals (current goals can now be found in the care plan section) Progress towards PT goals: Progressing toward goals ? ?  ?Frequency ? ? ? Min 2X/week ? ? ? ?  ?PT Plan Current plan remains  appropriate  ? ? ?Co-evaluation   ?  ?  ?  ?  ? ?  ?AM-PAC PT "6 Clicks" Mobility   ?Outcome Measure ? Help needed turning from your back to your side while in a flat bed without using bedrails?: A Little ?Help needed moving from lying on your back to sitting on the side of a flat bed without using bedrails?: A Little ?Help needed moving to and from a bed to a chair (including a wheelchair)?: A Little ?Help needed standing up from a chair using your arms (e.g., wheelchair or bedside chair)?: A Little ?Help needed to walk in hospital room?: Total ?Help needed climbing 3-5 steps with a railing? : Total ?6 Click Score: 14 ? ?  ?End of Session Equipment Utilized During Treatment: Gait belt ?Activity Tolerance: Patient tolerated treatment well ?Patient left: in bed;with call bell/phone within reach ?Nurse Communication: Mobility status ?PT Visit Diagnosis: Other abnormalities of gait and mobility (R26.89);Muscle weakness (generalized) (M62.81) ?  ? ? ?Time: 0347-4259 ?PT Time Calculation (min) (ACUTE ONLY): 39 min ? ?Charges:  $Therapeutic Activity: 38-52 mins          ?          ?Rica Koyanagi  PTA ?Acute  Rehabilitation Services ?Pager      [phone] ?Office      [phone] ? ?

## 2021-04-27 NOTE — Care Management Important Message (Signed)
Important Message ? ?Patient Details IM Letter given to the Patient. ?Name: [name] ?MRN: [id number] ?Date of Birth: January 06, 1951 ? ? ?Medicare Important Message Given:  Yes ? ? ? ? ?[name] ?04/27/2021, 10:38 AM ?

## 2021-04-27 NOTE — TOC Progression Note (Signed)
Transition of Care (TOC) - Progression Note  ? ? ?Patient Details  ?Name: [name] ?MRN: [id number] ?Date of Birth: [birth year] ? ?Transition of Care (TOC) CM/SW Contact  ?Trish Mage, LCSW ?Phone Number: ?04/27/2021, 11:03 AM ? ?Clinical Narrative:   Informed patient he had been denied by insurance.  He initially declined to appeal, saying he had confirmed with receptionist that he can return there from hospital.  Of course, when I called to confirm she stated she had told him it was not her decision, but that they were holding his bed, for which he is paying while in the hospital. I went back to him with the news, pointed out that he was making good progress with PT here and that his stay at rehab would likely be brief. He also knows that he owes [facility] money from last stay, and that he will likely have co-pay when he returns as he has not had a 60 day re-set.  He agreed to allow me to call his sister, Ms Levan Hurst, and she agreed to appeal decision with insurance company.  She was given all relevent information, and called me back to confirm that she had called. Called Vida Roller at [facility] to confirm that they are willing to work with him if appeal is overturned. TOC will continue to follow during the course of hospitalization. ? ? ? ? ?Expected Discharge Plan: [place] ?Barriers to Discharge: Ship broker, SNF Pending bed offer ? ?Expected Discharge Plan and Services ?Expected Discharge Plan: [place] ?  ?Discharge Planning Services: CM Consult ?Post Acute Care Choice: [place] ?Living arrangements for the past 2 months: [place] ?                ?  ?  ?  ?  ?  ?  ?  ?  ?  ?  ? ? ?Social Determinants of Health (SDOH) Interventions ?  ? ?Readmission Risk Interventions ?Readmission Risk Prevention Plan 11/09/2019  ?Transportation Screening Complete  ?PCP or Specialist Appt within 5-7 Days Complete  ?Home Care Screening Complete   ?Medication Review (RN CM) Complete  ?Some recent data might be hidden  ? ? ?

## 2021-04-28 DIAGNOSIS — I5043 Acute on chronic combined systolic (congestive) and diastolic (congestive) heart failure: Secondary | ICD-10-CM | POA: Diagnosis not present

## 2021-04-28 LAB — VITAMIN B12: Vitamin B-12: 873 pg/mL (ref 180–914)

## 2021-04-28 NOTE — Progress Notes (Addendum)
?[facility] Progress Note ? ?Patient: [name]     ?AGT:[id number]  ?DOA: 04/13/2021   ?  ?  ?Brief hospital course: ?71 y.o. male who lives in assisted living and has Afib, Hx of DVT, Left AKA, morbid obesity,chronic hypoxic respiratory failure on 2L [place], OSA chronic  HF, hypothyroidism, intermittent scrotal and groin swelling for several months requiring multiple ED visits presented with increased shortness of breath along with groin swelling and redness.   ? ?CT of abdomen/pelvis showed subcutaneous edema in the right inguinal region/perineum with mild skin thickening but no drainable fluid collection or abscess along with scrotal wall thickening with possible hydrocele.  Treated for scrotal cellulitis and acute on chronic heart failure.  ?Now awaiting SNF.  ?He was at a SNF earlier this year and then went to an ALF. ?I did a P2P this past weekend with [place] for SNF approval and it was denied. His sister is now appealing this denial. Meanwhile PT is working with him to improve his strength so maybe he will be able to return to an ALF.  ?I feel ultimately he will need Medicaid for long term care and have communicated this with TOC ? ?Subjective:  ?No medical complaints. I asked why he is not doing his own appeal for the SNF and he states he is tired of dealing with insurance companies and would rather his sister do it.  ? ?Assessment and Plan: ?Cellulitis of groin, right ?Scrotal cellulitis ?-CT imaging as above.  Scrotal ultrasound also showed mild scrotal edema along with possible complex hydroceles ?--Urology felt he may have had early cellulitis and fluid overload ?- received 10 days of antibiotics up until 3/15 ? ?Acute on chronic combined systolic and diastolic CHF (congestive heart failure) ([place]) ? -Echo showed EF of 45 to 50% with global hypokinesis.   ?- IV Lasix  transitioned to Torsemide on 3/12 by cardiology ?- cont Losartan and Toprol ?- Cardiology signed off on 3/17 ? ?Chronic  respiratory failure with hypoxia (HCC) ?- question if this is related to OHS ?-Normally uses 2 L oxygen via nasal cannula during daytime and CPAP at night.    ? ?Hypothyroidism ?- Continue Synthroid ? ?OSA (obstructive sleep apnea) ?- cont CPAP ? ?Persistent atrial fibrillation ([place]) ?-Continue Toprol and Eliquis ?- Dig added on 3/15 - rates improved ? ?Morbid obesity with BMI of 60.0-69.9, adult ([place]) ?-Body mass index is 70.53 kg/m?.  ?- continue to weigh at least weekly ?  ?Physical deconditioning ?- PT recommends SNF placement.    ?  ?Pressure injury of skin ?Stage II pressure injury right posterior thigh: Present on admission ?-Continue local wound care ? ?Left AKA ? ?Macrocytic anemia ?- f/u anemia panel in AM ? ?  ? ? ?DVT prophylaxis:   ?apixaban (ELIQUIS) tablet 5 mg   ?  Code Status: Full Code  ?Level of Care: Level of care: Telemetry ?Disposition Plan:  ?Status is: Inpatient ?Remains inpatient appropriate because: awaiting disposition ? ?Objective: ?  ?Vitals:  ? 04/27/21 2048 04/27/21 2334 04/28/21 0428 04/28/21 0430  ?BP: 110/72  (!) 119/95   ?Pulse: 92 100 87   ?Resp: '18 19 18   '$ ?Temp: 98.3 ?F (36.8 ?C)  98.6 ?F (37 ?C)   ?TempSrc: Oral  Oral   ?SpO2: 94% 94% 98%   ?Weight:    (!) 209.6 kg  ?Height:      ? ?Filed Weights  ? 04/20/21 0935 04/22/21 1300 04/28/21 0430  ?Weight: (!) 216.4 kg Marland Kitchen)  210.4 kg (!) 209.6 kg  ? ?Exam: ?General exam: Appears comfortable  ?HEENT: PERRLA, oral mucosa moist, no sclera icterus or thrush ?Respiratory system: Clear to auscultation. Respiratory effort normal. ?Cardiovascular system: S1 & S2 heard, regular rate and rhythm ?Gastrointestinal system: Abdomen soft, non-tender, nondistended. Normal bowel sounds   ?Central nervous system: Alert and oriented. No focal neurological deficits. ?Extremities: No cyanosis, clubbing or edema ?Skin: No rashes or ulcers ?Psychiatry:  Mood & affect appropriate.   ? ?Imaging and lab data was personally reviewed ? ? ? CBC: ?No results for  input(s): WBC, NEUTROABS, HGB, HCT, MCV, PLT in the last 168 hours. ? ?Basic Metabolic Panel: ?Recent Labs  ?Lab 04/22/21 ?2482 04/23/21 ?0506  ?NA 139 139  ?K 3.8 3.9  ?CL 92* 96*  ?CO2 39* 33*  ?GLUCOSE 106* 106*  ?BUN 47* 42*  ?CREATININE 0.96 0.94  ?CALCIUM 10.2 9.8  ?MG 2.4 2.2  ? ? ?GFR: ?Estimated Creatinine Clearance: 129.2 mL/min (by C-G formula based on SCr of 0.94 mg/dL). ? ?Scheduled Meds: ? (feeding supplement) PROSource Plus  30 mL Oral TID BM  ? apixaban  5 mg Oral BID  ? vitamin C  500 mg Oral Daily  ? clotrimazole   Topical BID  ? digoxin  0.125 mg Oral Daily  ? gabapentin  800 mg Oral TID  ? levothyroxine  125 mcg Oral Q0600  ? Living Better with Heart Failure Book   Does not apply Once  ? losartan  25 mg Oral Daily  ? mouth rinse  15 mL Mouth Rinse BID  ? metoprolol succinate  100 mg Oral Daily  ? multivitamin with minerals  1 tablet Oral Daily  ? nutrition supplement (JUVEN)  1 packet Oral BID WC  ? torsemide  20 mg Oral Daily  ? vitamin B-12  1,000 mcg Oral Daily  ? ?Continuous Infusions: ? ? LOS: 15 days  ? ?Author: ?Debbe Odea  ?04/28/2021 10:56 AM ?   ?

## 2021-04-28 NOTE — Progress Notes (Signed)
Nutrition Follow-up ? ?DOCUMENTATION CODES:  ? ?Morbid obesity ? ?INTERVENTION:  ? ?-Juven BID each serving provides 95kcal and 2.5g of protein (amino acids glutamine and arginine) ? ?-Prosource Plus PO TID, each provides 100 kcals and 15g protein ? ?-Multivitamin with minerals daily ? ?NUTRITION DIAGNOSIS:  ? ?Increased nutrient needs related to wound healing as evidenced by estimated needs. ? ?Ongoing. ? ?GOAL:  ? ?Patient will meet greater than or equal to 90% of their needs ? ?Progressing. ? ?MONITOR:  ? ?PO intake, Supplement acceptance, Labs, Weight trends, I & O's, Skin ? ?ASSESSMENT:  ? ?71 y.o. male with medical history significant of pAfib, Hx of DVT, chronic diastolic HF, hypothyroidism, morbid obesity, chronic hypoxic respiratory failure on 4L [place], intermittent scrotal and groin swelling for several months requiring multiple ED visits presented with increased shortness of breath along with groin swelling and redness. ? ?Patient currently consuming 80-100% of meals. Accepting protein supplements.  ? ?Admission weight: 402 lbs ?Current weight: 462 lbs ? ?I/Os:  -28.7L since 3/7 ? ?Medications: Vitamin C, Multivitamin with minerals daily, Vitamin B-12 ? ?Labs reviewed. ? ?Diet Order:   ?Diet Order   ? ?       ?  Diet Heart Room service appropriate? Yes; Fluid consistency: Thin; Fluid restriction: 1200 mL Fluid  Diet effective now       ?  ? ?  ?  ? ?  ? ? ?EDUCATION NEEDS:  ? ?Not appropriate for education at this time ? ?Skin:  Skin Assessment: Skin Integrity Issues: ?Skin Integrity Issues:: Stage II ?Stage II: right thigh ? ?Last BM:  3/19 -type 3 ? ?Height:  ? ?Ht Readings from Last 1 Encounters:  ?04/17/21 '5\' 8"'$  (1.727 m)  ? ? ?Weight:  ? ?Wt Readings from Last 1 Encounters:  ?04/28/21 (!) 209.6 kg  ? ? ?BMI:  Body mass index is 70.25 kg/m?. ? ?Estimated Nutritional Needs:  ? ?Kcal:  2100-2300 ? ?Protein:  115-130g ? ?Fluid:  2L/day ? ?[name] Bibles, MS, RD, LDN ?Inpatient Clinical Dietitian ?Contact  information available via Amion ? ?

## 2021-04-29 DIAGNOSIS — L03314 Cellulitis of groin: Secondary | ICD-10-CM | POA: Diagnosis not present

## 2021-04-29 LAB — CBC
HCT: 39.2 % (ref 39.0–52.0)
Hemoglobin: 11.7 g/dL — ABNORMAL LOW (ref 13.0–17.0)
MCH: 31 pg (ref 26.0–34.0)
MCHC: 29.8 g/dL — ABNORMAL LOW (ref 30.0–36.0)
MCV: 103.7 fL — ABNORMAL HIGH (ref 80.0–100.0)
Platelets: 228 10*3/uL (ref 150–400)
RBC: 3.78 MIL/uL — ABNORMAL LOW (ref 4.22–5.81)
RDW: 15.7 % — ABNORMAL HIGH (ref 11.5–15.5)
WBC: 3.5 10*3/uL — ABNORMAL LOW (ref 4.0–10.5)
nRBC: 0 % (ref 0.0–0.2)

## 2021-04-29 LAB — BASIC METABOLIC PANEL
Anion gap: 10 (ref 5–15)
BUN: 44 mg/dL — ABNORMAL HIGH (ref 8–23)
CO2: 31 mmol/L (ref 22–32)
Calcium: 9.9 mg/dL (ref 8.9–10.3)
Chloride: 97 mmol/L — ABNORMAL LOW (ref 98–111)
Creatinine, Ser: 0.93 mg/dL (ref 0.61–1.24)
GFR, Estimated: >60 mL/min (ref >60)
Glucose, Bld: 99 mg/dL (ref 70–99)
Potassium: 4.1 mmol/L (ref 3.5–5.1)
Sodium: 138 mmol/L (ref 135–145)

## 2021-04-29 LAB — IRON AND TIBC
Iron: 72 ug/dL (ref 45–182)
Saturation Ratios: 19 % (ref 17.9–39.5)
TIBC: 371 ug/dL (ref 250–450)
UIBC: 299 ug/dL

## 2021-04-29 LAB — RETICULOCYTES
Immature Retic Fract: 14.2 % (ref 2.3–15.9)
RBC.: 3.93 MIL/uL — ABNORMAL LOW (ref 4.22–5.81)
Retic Count, Absolute: 77 10*3/uL (ref 19.0–186.0)
Retic Ct Pct: 2 % (ref 0.4–3.1)

## 2021-04-29 LAB — FERRITIN: Ferritin: 48 ng/mL (ref 24–336)

## 2021-04-29 LAB — DIGOXIN LEVEL: Digoxin Level: <0.2 ng/mL — ABNORMAL LOW (ref 0.8–2.0)

## 2021-04-29 LAB — FOLATE: Folate: 13.5 ng/mL (ref >5.9)

## 2021-04-29 MED ORDER — DIGOXIN 125 MCG PO TABS: 0.1250 mg | ORAL_TABLET | Freq: Every day | ORAL | Status: DC

## 2021-04-29 MED ORDER — TORSEMIDE 20 MG PO TABS: 20.0000 mg | ORAL_TABLET | Freq: Every day | ORAL | Status: DC

## 2021-04-29 MED ORDER — LOSARTAN POTASSIUM 25 MG PO TABS: 25.0000 mg | ORAL_TABLET | Freq: Every day | ORAL | Status: DC

## 2021-04-29 MED ORDER — PROSOURCE PLUS PO LIQD: 30.0000 mL | Freq: Three times a day (TID) | ORAL | Status: DC

## 2021-04-29 MED ORDER — METOPROLOL SUCCINATE ER 100 MG PO TB24: 100.0000 mg | ORAL_TABLET | Freq: Every day | ORAL | Status: DC

## 2021-04-29 MED ORDER — JUVEN PO PACK: 1.0000 | PACK | Freq: Two times a day (BID) | ORAL | 0 refills | Status: DC

## 2021-04-29 NOTE — Discharge Summary (Addendum)
Physician Discharge Summary  ?[name] OAC:[id number] DOB: [birth year] DOA: 04/13/2021 ? ?PCP: Bernerd Limbo, MD ? ?Admit date: 04/13/2021 ?Discharge date: 05/07/2021 ?Recommendations for Outpatient Follow-up:  ?Follow up with PCP in 1 weeks-call for appointment ?Please obtain BMP/CBC in one week ? ?Discharge Dispo: SNF ?Discharge Condition: Stable ?Code Status:   Code Status: Full Code ?Diet recommendation:  ?Diet Order   ? ?       ?  Diet Heart Room service appropriate? Yes; Fluid consistency: Thin; Fluid restriction: 1200 mL Fluid  Diet effective now       ?  ? ?  ?  ? ?  ?  ? ?Brief/Interim Summary: ?71 y.o. male who lives in assisted living and has Afib, Hx of DVT, Left AKA, morbid obesity,chronic hypoxic respiratory failure on 2L [place], OSA chronic diastolic HF, hypothyroidism, intermittent scrotal and groin swelling for several months requiring multiple ED visits presented with increased shortness of breath along with groin swelling and redness. CT of abdomen/pelvis showed subcutaneous edema in the right inguinal region/perineum with mild skin thickening but no drainable fluid collection or abscess along with scrotal wall thickening with possible hydrocele.  He was treated for scrotal cellulitis and acute on chronic heart failure. Now awaiting SNF.EP review was denied by insurance and patient family has appealed and waiting for appeal to process through.  Patient is medically stable for discharge  ?Addendum : finally approval obtained and patient is being discharged to skilled nursing facility 05/07/2021 which will be the discharge date. ? ?Discharge Diagnoses:  ?Active Problems: ?  Cellulitis of groin, right ?  Acute on chronic combined systolic and diastolic CHF (congestive heart failure) ([place]) ?  OSA (obstructive sleep apnea) ?  Hypothyroidism ?  Chronic respiratory failure with hypoxia (HCC) ?  Persistent atrial fibrillation ([place]) ?  Morbid obesity with BMI of 60.0-69.9, adult ([place]) ?  Pressure injury of  skin ?  Physical deconditioning ?Cellulitis of groin, right ?Scrotal cellulitis ?S/p CT/scrotal ultrasound  showed possible complex hydroceles.Seen by urology felt to be early cellulitis and fluid overload - s/p 10 days of antibiotic and got diuresed.Continue local care ?  ?Acute on chronic combined systolic and diastolic CHF: ?Echo showed EF of 45 to 50% with global hypokinesis.Seen by cardiology aggressively diuresed now on oral regimen with torsemide 20 mg daily, Toprol 100 mg daily, losartan 25.Continue diet control 2 g salt restriction fluid restriction and weight monitoring for CHF.Net IO Since Admission: -41,699.6 mL [05/07/21 0819].  CHF is compensated at this time ?  ?Chronic respiratory failure with hypoxia ?OSA: ?question if this is related to OHS,normally uses 2 L oxygen via nasal cannula during daytime and CPAP at night.    ?Hypothyroidism:Cont Synthroid ?Persistent atrial fibrillation: Rate controlled, continue Toprol, digoxin and Eliquis.  ?Morbid obesity with BMI of 60.0-69.9, adult:Body mass index is 70.53 kg/m?. continue to weigh at least weekly ?Physical deconditioning: PT recommends SNF placement. Awaiting PLACEMENT.   ?Left AKA-no wound, cont care. ?Macrocytic anemia: Hemoglobin is stable at 11.7 g.  Anemia panel stable with normal W10 folate and folic acic ?Morbid obesity with BMI more than 40:Will benefit with weight loss PC follow-up,  ?Right ear fullness: cont Flonase ? ?Pressure Ulcer: ?Pressure Injury 04/13/21 Thigh Posterior;Proximal;Right Stage 2 -  Partial thickness loss of dermis presenting as a shallow open injury with a red, pink wound bed without slough. (Active)  ?04/13/21 1700  ?Location: Thigh  ?Location Orientation: Posterior;Proximal;Right  ?Staging: Stage 2 -  Partial thickness loss of dermis presenting  as a shallow open injury with a red, pink wound bed without slough.  ?Wound Description (Comments):   ?Present on Admission: Yes  ?Dressing Type Foam - Lift dressing to assess  site every shift 05/06/21 2141  ? ? ?Consults: ?CARDIOLOGY  ?Subjective: ?Alert awake oriented, feels ready for skilled nursing facility today as his appeal has been approved for SNF ? ?Discharge Exam: ?Vitals:  ? 05/06/21 2216 05/07/21 0459  ?BP:  102/68  ?Pulse: 71 76  ?Resp: 20 19  ?Temp:  97.7 ?F (36.5 ?C)  ?SpO2: 92% 94%  ? ?General: Pt is alert, awake, not in acute distress ?Cardiovascular: RRR, S1/S2 +, no rubs, no gallops ?Respiratory: CTA bilaterally, no wheezing, no rhonchi ?Abdominal: Soft, NT, ND, bowel sounds + ?Extremities: no edema, no cyanosis ? ?Discharge Instructions ? ?Discharge Instructions   ? ? (HEART FAILURE PATIENTS) Call MD:  Anytime you have any of the following symptoms: 1) 3 pound weight gain in 24 hours or 5 pounds in 1 week 2) shortness of breath, with or without a dry hacking cough 3) swelling in the hands, feet or stomach 4) if you have to sleep on extra pillows at night in order to breathe.   Complete by: As directed ?  ? Discharge instructions   Complete by: As directed ?  ? Please call call MD or return to ER for similar or worsening recurring problem that brought you to hospital or if any fever,nausea/vomiting,abdominal pain, uncontrolled pain, chest pain,  shortness of breath or any other alarming symptoms. ? ?Please follow-up your doctor as instructed in a week time and call the office for appointment. ? ?Please avoid alcohol, smoking, or any other illicit substance and maintain healthy habits including taking your regular medications as prescribed. ? ?You were cared for by a hospitalist during your hospital stay. If you have any questions about your discharge medications or the care you received while you were in the hospital after you are discharged, you can call the unit and ask to speak with the hospitalist on call if the hospitalist that took care of you is not available. ? ?Once you are discharged, your primary care physician will handle any further medical issues. Please  note that NO REFILLS for any discharge medications will be authorized once you are discharged, as it is imperative that you return to your primary care physician (or establish a relationship with a primary care physician if you do not have one) for your aftercare needs so that they can reassess your need for medications and monitor your lab values  ? Discharge wound care:   Complete by: As directed ?  ? Regular wound care dressing per nursing  ? Increase activity slowly   Complete by: As directed ?  ? ?  ? ?Allergies as of 05/07/2021   ? ?   Reactions  ? Morphine Hives  ? ?  ? ?  ?Medication List  ?  ? ?STOP taking these medications   ? ?albuterol 108 (90 Base) MCG/ACT inhaler ?Commonly known as: VENTOLIN HFA ?  ?diltiazem 240 MG 24 hr capsule ?Commonly known as: CARDIZEM CD ?  ?furosemide 20 MG tablet ?Commonly known as: LASIX ?  ?metoprolol tartrate 50 MG tablet ?Commonly known as: LOPRESSOR ?  ?Xarelto 20 MG Tabs tablet ?Generic drug: rivaroxaban ?  ? ?  ? ?TAKE these medications   ? ?acetaminophen 500 MG tablet ?Commonly known as: TYLENOL ?Take 500 mg by mouth every 6 (six) hours as needed for mild pain  or fever. ?  ?apixaban 5 MG Tabs tablet ?Commonly known as: ELIQUIS ?Take 5 mg by mouth 2 (two) times daily. ?  ?capsaicin 0.025 % cream ?Commonly known as: ZOSTRIX ?Apply 1 application topically every 8 (eight) hours as needed (phantom limb pain on the left thigh/leg). ?  ?clotrimazole 1 % cream ?Commonly known as: LOTRIMIN ?Apply 1 application topically 2 (two) times daily. ?  ?digoxin 0.125 MG tablet ?Commonly known as: LANOXIN ?Take 1 tablet (0.125 mg total) by mouth daily. ?  ?gabapentin 800 MG tablet ?Commonly known as: NEURONTIN ?Take 800 mg by mouth 3 (three) times daily. ?  ?guaiFENesin-dextromethorphan 100-10 MG/5ML syrup ?Commonly known as: ROBITUSSIN DM ?Take 10 mLs by mouth every 4 (four) hours as needed for cough. ?What changed: how much to take ?  ?levothyroxine 125 MCG tablet ?Commonly known as:  SYNTHROID ?Take 125 mcg by mouth daily before breakfast. ?  ?losartan 25 MG tablet ?Commonly known as: COZAAR ?Take 1 tablet (25 mg total) by mouth daily. ?  ?Melatonin 10 MG Caps ?Take 10 mg by mouth at b

## 2021-04-29 NOTE — Plan of Care (Signed)
?  Problem: Education: ?Goal: Knowledge of General Education information will improve ?Description: Including pain rating scale, medication(s)/side effects and non-pharmacologic comfort measures ?Outcome: Progressing ?  ?Problem: Clinical Measurements: ?Goal: Respiratory complications will improve ?Outcome: Progressing ?  ?Problem: Activity: ?Goal: Risk for activity intolerance will decrease ?Outcome: Progressing ?  ?Problem: Elimination: ?Goal: Will not experience complications related to bowel motility ?Outcome: Progressing ?Goal: Will not experience complications related to urinary retention ?Outcome: Progressing ?  ?Problem: Pain Managment: ?Goal: General experience of comfort will improve ?Outcome: Progressing ?  ?Problem: Safety: ?Goal: Ability to remain free from injury will improve ?Outcome: Progressing ?  ?Problem: Skin Integrity: ?Goal: Risk for impaired skin integrity will decrease ?Outcome: Progressing ?  ?

## 2021-04-30 ENCOUNTER — Ambulatory Visit: Payer: Medicare Other | Admitting: Nurse Practitioner

## 2021-04-30 DIAGNOSIS — I5043 Acute on chronic combined systolic (congestive) and diastolic (congestive) heart failure: Secondary | ICD-10-CM | POA: Diagnosis not present

## 2021-04-30 NOTE — Progress Notes (Signed)
?PROGRESS NOTE ?[name]  TGG:[id number] DOB: Jun 03, 1950 DOA: 04/13/2021 ?PCP: Bernerd Limbo, MD  ? ?Brief Narrative/Hospital Course: ?71 y.o. male who lives in assisted living and has Afib, Hx of DVT, Left AKA, morbid obesity,chronic hypoxic respiratory failure on 2L [place], OSA chronic diastolic HF, hypothyroidism, intermittent scrotal and groin swelling for several months requiring multiple ED visits presented with increased shortness of breath along with groin swelling and redness. CT of abdomen/pelvis showed subcutaneous edema in the right inguinal region/perineum with mild skin thickening but no drainable fluid collection or abscess along with scrotal wall thickening with possible hydrocele.  He was treated for scrotal cellulitis and acute on chronic heart failure. Now awaiting SNF.EP review was denied by insurance and patient family has appealed and waiting for appeal to process through.  Patient is medically stable for discharge  ?  ?Subjective: ?Seen examined this morning.  No shortness of breath on home oxygen setting. ?No acute events overnight.  Resting well, reports she had a bowel movement yesterday. ? ?Assessment and Plan: ?Active Problems: ?  Cellulitis of groin, right ?  Acute on chronic combined systolic and diastolic CHF (congestive heart failure) ([place]) ?  OSA (obstructive sleep apnea) ?  Hypothyroidism ?  Chronic respiratory failure with hypoxia (H[facility]) ?  Persistent atrial fibrillation ([place]) ?  Morbid obesity with BMI of 60.0-69.9, adult ([place]) ?  Pressure injury of skin ?  Physical deconditioning ? ?Cellulitis of groin, right ?Scrotal cellulitis ?S/p CT/scrotal ultrasound - possible complex hydroceles.  Seen by urology today for early cellulitis fluid overload completed 10 days of antibiotic.  Continue local care ?  ?Acute on chronic combined systolic and diastolic CHF :Echo showed EF of 45 to 50% with global hypokinesis.  Seen by cardiology aggressively diuresed now tolerating oral torsemide  losartan Toprol.  Follow-up with cardiology as outpatient.  Continue diet control 2 g salt restriction fluid restriction and weight monitoring for CHF.  Net negative balance as Net IO Since Admission: -29,482.6 mL [04/30/21 1025]  ? ?Chronic respiratory failure with hypoxia:question if this is related to OHS ?-Normally uses 2 L oxygen via nasal cannula during daytime and CPAP at night.    ?  ?Hypothyroidism:Continue Synthroid ?  ?VOJ:JKKX CPAP ?  ?Persistent atrial fibrillation:Continue Toprol and Eliquis ?- Dig added on 3/15 - rates improved-checking level prior to discharge ?  ?Morbid obesity with BMI of 60.0-69.9, adult:Body mass index is 70.53 kg/m?. continue to weigh at least weekly ?  ?Physical deconditioning: PT recommends SNF placement.    ?  ?Left AKA ?  ?Macrocytic anemia: Hemoglobin is stable at 11.7 g.  Anemia panel stable with normal F81 folate and folic acic ? ?Morbid obesity with BMI more than 40: Will benefit with weight loss PC follow-up,  ? ?DVT prophylaxis: eliquis ?Code Status:   Code Status: Full Code ?Family Communication: plan of care discussed with patient at bedside. ?Patient status is: Inpatient level of care: Telemetry  ?Remains inpatient because: Unsafe disposition waiting for appeal ?Patient currently is stable ? ?Dispo: The patient is from: home ?           Anticipated disposition: SNF-TBD ? ?Mobility Assessment (last 72 hours)   ? ? Mobility Assessment   ? ? [place] Name 04/29/21 1908 04/29/21 0705 04/28/21 2030  ?  ?  ? Does patient have an order for bedrest or is patient medically unstable No - Continue assessment No - Continue assessment No - Continue assessment    ? What is the highest level  of mobility based on the progressive mobility assessment? Level 2 ([place]) - Balance while sitting on edge of bed and cannot stand Level 2 (Chairfast) - Balance while sitting on edge of bed and cannot stand Level 2 (Chairfast) - Balance while sitting on edge of bed and cannot stand    ? Is the  above level different from baseline mobility prior to current illness? No - Consider discontinuing PT/OT Yes - Recommend PT order Yes - Recommend PT order    ? ?  ?  ? ?  ?  ? ?Objective: ?Vitals last 24 hrs: ?Vitals:  ? 04/29/21 2122 04/29/21 2213 04/30/21 0112 04/30/21 0410  ?BP:    118/71  ?Pulse:  71  78  ?Resp:  20  18  ?Temp:    (!) 97.5 ?F (36.4 ?C)  ?TempSrc:    Axillary  ?SpO2:  93%  94%  ?Weight: (!) 209.6 kg  (!) 209.6 kg   ?Height:      ? ?Weight change: 0.454 kg ? ?Physical Examination: ?General exam: AA oriented, morbidly obese, on oxygen,older than stated age, weak appearing. ?HEENT:Oral mucosa moist, Ear/Nose WNL grossly, dentition normal. ?Respiratory system: bilaterally diminished BS, no use of accessory muscle ?Cardiovascular system: S1 & S2 +, No JVD,. ?Gastrointestinal system: Abdomen soft,NT,ND, BS+ ?Nervous System:Alert, awake, moving extremities and grossly nonfocal ?Extremities:  LT AKA, EL NO EDEMA ?Skin: No rashes,no icterus. ?MSK: Normal muscle bulk,tone, power ? ?Medications reviewed:  ?Scheduled Meds: ? (feeding supplement) PROSource Plus  30 mL Oral TID BM  ? apixaban  5 mg Oral BID  ? vitamin C  500 mg Oral Daily  ? clotrimazole   Topical BID  ? digoxin  0.125 mg Oral Daily  ? gabapentin  800 mg Oral TID  ? levothyroxine  125 mcg Oral Q0600  ? Living Better with Heart Failure Book   Does not apply Once  ? losartan  25 mg Oral Daily  ? mouth rinse  15 mL Mouth Rinse BID  ? metoprolol succinate  100 mg Oral Daily  ? multivitamin with minerals  1 tablet Oral Daily  ? nutrition supplement (JUVEN)  1 packet Oral BID WC  ? torsemide  20 mg Oral Daily  ? vitamin B-12  1,000 mcg Oral Daily  ? ?Continuous Infusions: ? ?Pressure Injury 04/13/21 Thigh Posterior;Proximal;Right Stage 2 -  Partial thickness loss of dermis presenting as a shallow open injury with a red, pink wound bed without slough. (Active)  ?04/13/21 1700  ?Location: Thigh  ?Location Orientation: Posterior;Proximal;Right   ?Staging: Stage 2 -  Partial thickness loss of dermis presenting as a shallow open injury with a red, pink wound bed without slough.  ?Wound Description (Comments):   ?Present on Admission: Yes  ?Dressing Type Foam - Lift dressing to assess site every shift 04/29/21 1908  ? ?Diet Order   ? ?       ?  Diet Heart Room service appropriate? Yes; Fluid consistency: Thin; Fluid restriction: 1200 mL Fluid  Diet effective now       ?  ? ?  ?  ? ?  ?  ? ?Nutrition Problem: Increased nutrient needs ?Etiology: wound healing ?Signs/Symptoms: estimated needs ?Interventions: Premier Protein, Juven, Prostat ? ? ?Intake/Output Summary (Last 24 hours) at 04/30/2021 1025 ?Last data filed at 04/30/2021 0144 ?Gross per 24 hour  ?Intake 2376 ml  ?Output 1500 ml  ?Net 876 ml  ? ?Net IO Since Admission: -29,482.6 mL [04/30/21 1025]  ?Wt Readings from Last 3  Encounters:  ?04/30/21 (!) 209.6 kg  ?03/19/21 (!) 199.6 kg  ?01/29/21 (!) 199.6 kg  ?  ? ?Unresulted Labs (From admission, onward)  ? ? None  ? ?  ?Data Reviewed: I have personally reviewed following labs and imaging studies ?CBC: ?Recent Labs  ?Lab 24-May-2021 ?1030  ?WBC 3.5*  ?HGB 11.7*  ?HCT 39.2  ?MCV 103.7*  ?PLT 228  ? ?Basic Metabolic Panel: ?Recent Labs  ?Lab May 24, 2021 ?0962  ?NA 138  ?K 4.1  ?CL 97*  ?CO2 31  ?GLUCOSE 99  ?BUN 44*  ?CREATININE 0.93  ?CALCIUM 9.9  ? ?GFR: ?Estimated Creatinine Clearance: 130.6 mL/min (by C-G formula based on SCr of 0.93 mg/dL). ?Liver Function Tests: ?No results for input(s): AST, ALT, ALKPHOS, BILITOT, PROT, ALBUMIN in the last 168 hours. ?No results for input(s): LIPASE, AMYLASE in the last 168 hours. ?No results for input(s): AMMONIA in the last 168 hours. ?Coagulation Profile: ?No results for input(s): INR, PROTIME in the last 168 hours. ?BNP (last 3 results) ?No results for input(s): PROBNP in the last 8760 hours. ?HbA1C: ?No results for input(s): HGBA1C in the last 72 hours. ?CBG: ?No results for input(s): GLUCAP in the last 168  hours. ?Lipid Profile: ?No results for input(s): CHOL, HDL, LDLCALC, TRIG, CHOLHDL, LDLDIRECT in the last 72 hours. ?Thyroid Function Tests: ?No results for input(s): TSH, T4TOTAL, FREET4, T3FREE, THYROIDAB in the last 72 h

## 2021-04-30 NOTE — Progress Notes (Signed)
Physical Therapy Treatment ?Patient Details ?Name: [name] ?MRN: [id number] ?DOB: 02-03-1951 ?Today's Date: 04/30/2021 ? ? ?History of Present Illness [name] is a 71 y.o. male presents with increased edema to his perineum and lower abdomen. CT of abdomen/pelvis showed subcutaneous edema in the right inguinal region/perineum with mild skin thickening but no drainable fluid collection or abscess along with scrotal wall thickening with possible hydrocele. 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 ?  ?PT Comments  ? ? Pt is AxO x 3 very pleasant and always willing to participate.  Pt's goal is to get strong enough to get back to his prior transfer ability. ?General bed mobility comments: pt making progress with all bed mobility.  able to use rails and self scoot to EOB. General transfer comment: pt progressing with his ability to stand and tolerate an increased upright time.  Pt no longer needs support through L AKA on chair.  Uing B UE's and momentum, pt was able to stand FOUR TIMES and maintain an upright posture at greatest duration of 40 seconds.  Pt was also able to complete 1/4 pivot to reclibner today using B UE's for support and turning to his RIGHT. Positioned to comfort.    ?Recommendations for follow up therapy are one component of a multi-disciplinary discharge planning process, led by the attending physician.  Recommendations may be updated based on patient status, additional functional criteria and insurance authorization. ? ?Follow Up Recommendations ? Skilled nursing-short term rehab (<3 hours/day) ?  ?  ?Assistance Recommended at Discharge Frequent or constant Supervision/Assistance  ?Patient can return home with the following Two people to help with walking and/or transfers;Two people to help with bathing/dressing/bathroom;Assistance with cooking/housework ?  ?Equipment Recommendations ? None recommended by PT  ?  ?Recommendations for Other Services    ? ? ?  ?Precautions / Restrictions Precautions ?Precautions: Fall ?Precaution Comments: scrotal/groin edema dicomfort ?Restrictions ?Other Position/Activity Restrictions: L AKA (no prosthesis)  ?  ? ?Mobility ? Bed Mobility ?Overal bed mobility: Needs Assistance ?Bed Mobility: Rolling, Sit to Supine, Supine to Sit ?Rolling: Min assist ?  ?  ?  ?  ?General bed mobility comments: pt making progress with all bed mobility.  able to use rails and self scoot to EOB. ?  ? ?Transfers ?Overall transfer level: Needs assistance ?Equipment used:  (bed rail,bed foot board and back of straight chir.) ?Transfers: Sit to/from Stand ?Sit to Stand: Min guard, Min assist ?  ?  ?  ?  ?  ?General transfer comment: pt progressing with his ability to stand and tolerate an increased upright time.  Pt no longer needs support through L AKA on chair.  Uing B UE's and momentum, pt was able to stand FOUR TIMES and maintain an upright posture at greatest duration of 40 seconds.  Pt was also able to complete 1/4 pivot to reclibner today using B UE's for support and turning to his RIGHT. ?  ? ?Ambulation/Gait ?  ?  ?  ?  ?  ?  ?  ?  ? ? ?Stairs ?  ?  ?  ?  ?  ? ? ?Wheelchair Mobility ?  ? ?Modified Rankin (Stroke Patients Only) ?  ? ? ?  ?Balance   ?  ?  ?  ?  ?  ?  ?  ?  ?  ?  ?  ?  ?  ?  ?  ?  ?  ?  ?  ? ?  ?  Cognition Arousal/Alertness: Awake/alert ?Behavior During Therapy: [facility] for tasks assessed/performed ?Overall Cognitive Status: Within Functional Limits for tasks assessed ?  ?  ?  ?  ?  ?  ?  ?  ?  ?  ?  ?  ?  ?  ?  ?  ?General Comments: AxO x 3 very familiar from prior admits.  Very motivated and pleasant. loves to tell stories. ?  ?  ? ?  ?Exercises   ? ?  ?General Comments   ?  ?  ? ?Pertinent Vitals/Pain Pain Assessment ?Pain Assessment: Faces ?Faces Pain Scale: Hurts a little bit ?Pain Location: scrotum/positional ?Pain Descriptors / Indicators: Discomfort ?Pain Intervention(s): Monitored during session  ? ? ?Home Living   ?  ?  ?  ?   ?  ?  ?  ?  ?  ?   ?  ?Prior Function    ?  ?  ?   ? ?PT Goals (current goals can now be found in the care plan section) Progress towards PT goals: Progressing toward goals ? ?  ?Frequency ? ? ? Min 2X/week ? ? ? ?  ?PT Plan Current plan remains appropriate  ? ? ?Co-evaluation   ?  ?  ?  ?  ? ?  ?AM-PAC PT "6 Clicks" Mobility   ?Outcome Measure ? Help needed turning from your back to your side while in a flat bed without using bedrails?: A Little ?Help needed moving from lying on your back to sitting on the side of a flat bed without using bedrails?: A Little ?Help needed moving to and from a bed to a chair (including a wheelchair)?: A Little ?Help needed standing up from a chair using your arms (e.g., wheelchair or bedside chair)?: A Little ?Help needed to walk in hospital room?: Total ?Help needed climbing 3-5 steps with a railing? : Total ?6 Click Score: 14 ? ?  ?End of Session Equipment Utilized During Treatment: Gait belt ?Activity Tolerance: Patient tolerated treatment well ?Patient left: in chair;with call bell/phone within reach ?Nurse Communication: Mobility status ?PT Visit Diagnosis: Other abnormalities of gait and mobility (R26.89);Muscle weakness (generalized) (M62.81) ?  ? ? ?Time: 1321 - 1346 ?PT Time Calculation (min) (ACUTE ONLY): 25 min ? ?Charges:  $Therapeutic Activity: 23-37 mins          ?          ? ?{Abrham Maslowski  PTA ?Acute  Rehabilitation Services ?Pager      [phone] ?Office      [phone] ? ?

## 2021-04-30 NOTE — Progress Notes (Deleted)
? ? ?Office Visit  ?  ?Patient Name: [name] ?Date of Encounter: 04/30/2021 ? ?Primary Care Provider:  Bernerd Limbo, MD ?Primary Cardiologist:  Sanda Klein, MD ? ?Chief Complaint  ?  ?71 year old male with a history of permanent atrial fibrillation on Eliquis, chronic combined systolic and diastolic heart failure, recurrent DVT, OSA on CPAP, chronic hypoxic respiratory failure on 4 L home O2, GERD, anemia, morbid obesity, severe orthopedic issues in the setting of morbid obesity, and L AKA in the setting of septic arthritis,  who presents for post hospital follow-up related to heart failure.  ? ?Past Medical History  ?  ?Past Medical History:  ?Diagnosis Date  ? CHF (congestive heart failure) ([place]) 05/2019  ? Chronic anticoagulation   ? with Xarelto  ? Complication of anesthesia   ? PT STATES HARD TO WAKE UP AFTER ONE SUGERY -STATES THE SURGERY TOOK LONGER THAN EXPECTED.  NO PROBLEMS WITH ANY OTHER SURGERY  ? Complication of anesthesia   ? in 12/2018-states started  shaking after surgery, could not get warm   ? Dysrhythmia   ? A-fib  ? GERD (gastroesophageal reflux disease)   ? History of blood clots   ? History of blood transfusion   ? Hypothyroidism   ? Pain   ? BACK PAIN - PT ATTRIBUTES TO THE WAY HE WALKS DUE TO LEFT KNEE PROBLEM  ? Persistent atrial fibrillation ([place])   ? Septic arthritis of knee ([place])   ? LEFT KNEE  ? Shortness of breath   ? WITH EXERTION AND PAIN  ? Sleep apnea   ? uses CPAP,  ? ?Past Surgical History:  ?Procedure Laterality Date  ? 2010 REMOVAL OF LEFT TOTAL KNEE    ? AMPUTATION Left 11/17/2019  ? Procedure: AMPUTATION ABOVE KNEE;  Surgeon: Newt Minion, MD;  Location: [facility];  Service: Orthopedics;  Laterality: Left;  ? CARDIAC CATHETERIZATION  2006  ? CARDIOVERSION N/A 08/28/2012  ? Procedure: CARDIOVERSION;  Surgeon: Sanda Klein, MD;  Location: [facility];  Service: Cardiovascular;  Laterality: N/A;  ? CARDIOVERSION N/A 09/01/2012  ? Procedure: CARDIOVERSION;  Surgeon: Pixie Casino, MD;  Location: [facility];  Service: Cardiovascular;  Laterality: N/A;  ? COLONOSCOPY N/A 06/13/2013  ? Procedure: COLONOSCOPY;  Surgeon: Inda Castle, MD;  Location: [facility];  Service: Endoscopy;  Laterality: N/A;  ? EXCISIONAL TOTAL KNEE ARTHROPLASTY WITH ANTIBIOTIC SPACERS Left 09/21/2018  ? Procedure: Resection of tibia versus both components with placement of antibiotic spacer;  Surgeon: Paralee Cancel, MD;  Location: [facility];  Service: Orthopedics;  Laterality: Left;  2.5 hrs  ? EXCISIONAL TOTAL KNEE ARTHROPLASTY WITH ANTIBIOTIC SPACERS Left 01/02/2019  ? Procedure: Repeat Washout and placement of antibiotic spacer left knee;  Surgeon: Paralee Cancel, MD;  Location: [facility];  Service: Orthopedics;  Laterality: Left;  2 hrs  ? HYDROCELECTOY   2012  ? I & D EXTREMITY Left 08/14/2019  ? Procedure: IRRIGATION AND DEBRIDEMENT LEFT KNEE WOUND;  Surgeon: Paralee Cancel, MD;  Location: [facility];  Service: Orthopedics;  Laterality: Left;  60 mins  ? I & D KNEE WITH POLY EXCHANGE Left 05/17/2019  ? Procedure: IRRIGATION AND DEBRIDEMENT KNEE WITH POLY EXCHANGE;  Surgeon: Paralee Cancel, MD;  Location: [facility];  Service: Orthopedics;  Laterality: Left;  90 mins  ? IRRIGATION AND DEBRIDEMENT KNEE Left 04/03/2019  ? Procedure: REIMPLANTATION OF LEFT KNEE TIBIAL COMPONENT.;  Surgeon: Paralee Cancel, MD;  [facility]: [facility];  Service: Orthopedics;  Laterality: Left;  need 120 min  ? IRRIGATION AND DEBRIDEMENT KNEE Left 11/06/2019  ? Procedure: IRRIGATION AND DEBRIDEMENT KNEE;  Surgeon: Paralee Cancel, MD;  Location: [facility];  Service: Orthopedics;  Laterality: Left;  90 mins  ? LEFT KNEE ARTHROSCOPY  1999  ? LEFT KNEE SURGERY UPPER TIBIAL OSTOMY    ? LEFT TOTAL KNEE REMOVAL FOR INFECTION  2006  ? REIMPLANTATION LEFT TOTAL KNEE  2008  ? REIMPLANTATION LEFT TOTAL KNEE   2006  ? REIMPLANTATION OF TOTAL KNEE Left 06/23/2012  ? Procedure: REIMPLANTATION OF LEFT TOTAL KNEE;  Surgeon: Mauri Pole, MD;  Location: [facility];  Service:  Orthopedics;  Laterality: Left;  ? REMOVAL LEFT TOTAL KNEE   2008  ? REPLACEMENT LEFT KNEE  2002  ? REPLACEMENT RIGHT KNEE  2003  ? REVISION LEFT KNEE CAP   2004  ? RIGHT KNEE ARTHROSCOPY  1998  ? TEE WITHOUT CARDIOVERSION N/A 08/28/2012  ? Procedure: TRANSESOPHAGEAL ECHOCARDIOGRAM (TEE);  Surgeon: Sanda Klein, MD;  Location: [facility];  Service: Cardiovascular;  Laterality: N/A;  ? TONSILLECTOMY    ? TOTAL KNEE REVISION Left 12/15/2015  ? Procedure: TOTAL KNEE REVISION REPLACEMENT;  Surgeon: Paralee Cancel, MD;  Location: [facility];  Service: Orthopedics;  Laterality: Left;  Adductor Block  ? ? ?Allergies ? ?Allergies  ?Allergen Reactions  ? Morphine Hives  ? ? ?History of Present Illness  ?  ?71 year old male with the above past medical history including permanent atrial fibrillation on Eliquis, chronic combined systolic and diastolic heart failure, recurrent DVT, OSA on CPAP, chronic hypoxic respiratory failure on 4 L home O2, GERD,anemia, morbid obesity, severe orthopedic issues in the setting of morbid obesity, and L AKA in the setting of septic arthritis. ? ?Echocardiogram in 2014 showed EF 50 to 55% (previously 45 to 50%), no RWMA, mildly dilated RV, severely dilated left atrium, moderately dilated right atrium, no significant valvular abnormalities.  He has been followed by Dr. Sallyanne Kuster for permanent atrial fibrillation and chronic combined systolic and diastolic heart failure.  He was last seen in the office on 09/20/2019 and was stable from a cardiac standpoint. ? ?Unfortunately, he presented to the ED on 04/12/2021 with complaints of a several month history of scrotal and groin swelling, increased shortness of breath.  CT of the abdomen pelvis showed subcutaneous edema in the right inguinal region/perineum with mild skin thickening but no drainable fluid collection or abscess, scrotal wall thickening with possible hydrocele.  He was hospitalized from 04/12/2021 to 04/29/2021 in the setting of scrotal cellulitis and  acute on chronic combined systolic and diastolic heart failure.  Urology was consulted for possible hydrocele, he completed 10 days of antibiotics.  Echocardiogram showed EF 45 to 50%, global hypokinesis, severe BAE, no significant valvular abnormalities.  He was diuresed and transition to oral torsemide.  Digoxin was added for rate control in the setting of permanent atrial fibrillation.  Physical therapy recommended SNF placement setting of physical conditioning.  He was discharged to skilled nursing on 04/29/2021 in stable condition. ? ?He presents today for follow-up.  Since his hospitalization ? ?Chronic combined systolic and diastolic heart failure: ?Permanent atrial fibrillation: ?Chronic hypoxic respiratory failure: ?Cellulitis: ?Anemia: ?Morbid obesity/physical deconditioning: ?OSA on CPAP: ?Disposition: ? ?Home Medications  ?  ?No current facility-administered medications for this visit.  ? ?Current Outpatient Medications  ?Medication Sig Dispense Refill  ? digoxin (LANOXIN) 0.125 MG tablet Take 1 tablet (0.125 mg total) by mouth daily.    ? losartan (COZAAR) 25 MG tablet Take  1 tablet (25 mg total) by mouth daily.    ? metoprolol succinate (TOPROL-XL) 100 MG 24 hr tablet Take 1 tablet (100 mg total) by mouth daily. Take with or immediately following a meal.    ? nutrition supplement, JUVEN, (JUVEN) PACK Take 1 packet by mouth 2 (two) times daily with a meal.  0  ? Nutritional Supplements (,FEEDING SUPPLEMENT, PROSOURCE PLUS) liquid Take 30 mLs by mouth 3 (three) times daily between meals.    ? torsemide (DEMADEX) 20 MG tablet Take 1 tablet (20 mg total) by mouth daily.    ? ?Facility-Administered Medications Ordered in Other Visits  ?Medication Dose Route Frequency Provider Last Rate Last Admin  ? (feeding supplement) PROSource Plus liquid 30 mL  30 mL Oral TID BM Alekh, Kshitiz, MD   30 mL at 04/29/21 2000  ? acetaminophen (TYLENOL) tablet 650 mg  650 mg Oral Q6H PRN Marylyn Ishihara, Tyrone A, DO   650 mg at  04/28/21 1601  ? Or  ? acetaminophen (TYLENOL) suppository 650 mg  650 mg Rectal Q6H PRN Marylyn Ishihara, Tyrone A, DO      ? apixaban (ELIQUIS) tablet 5 mg  5 mg Oral BID Marylyn Ishihara, Tyrone A, DO   5 mg at 04/29/21 2113  ? ascorbic acid

## 2021-04-30 NOTE — Progress Notes (Signed)
Physical Therapy Treatment ?Patient Details ?Name: [name] ?MRN: [id number] ?DOB: 1950-08-31 ?Today's Date: 04/30/2021 ? ? ?History of Present Illness Asael Pann is a 71 y.o. male presents with increased edema to his perineum and lower abdomen. CT of abdomen/pelvis showed subcutaneous edema in the right inguinal region/perineum with mild skin thickening but no drainable fluid collection or abscess along with scrotal wall thickening with possible hydrocele. 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 ?  ?PT Comments  ? ? Pt sat in recliner for a little over an hour.  Assisted back to bed via [place] + 2 assist.  Positioned in bed to comfort.    ?Recommendations for follow up therapy are one component of a multi-disciplinary discharge planning process, led by the attending physician.  Recommendations may be updated based on patient status, additional functional criteria and insurance authorization. ? ?Follow Up Recommendations ? Skilled nursing-short term rehab (<3 hours/day) ?  ?  ?Assistance Recommended at Discharge Frequent or constant Supervision/Assistance  ?Patient can return home with the following Two people to help with walking and/or transfers;Two people to help with bathing/dressing/bathroom;Assistance with cooking/housework ?  ?Equipment Recommendations ? None recommended by PT  ?  ?Recommendations for Other Services   ? ? ?  ?Precautions / Restrictions Precautions ?Precautions: Fall ?Precaution Comments: scrotal/groin edema dicomfort ?Restrictions ?Other Position/Activity Restrictions: L AKA (no prosthesis)  ?  ? ?Mobility ? Maxi Move from recliner back to bed.    ?  ?  ?  ?  ?  ?  ?  ? ? ?Stairs ?  ?  ?  ?  ?  ? ? ?Wheelchair Mobility ?  ? ?Modified Rankin (Stroke Patients Only) ?  ? ? ?  ?Balance   ?  ?  ?  ?  ?  ?  ?  ?  ?  ?  ?  ?  ?  ?  ?  ?  ?  ?  ?  ? ?  ?Cognition Arousal/Alertness: Awake/alert ?Behavior During Therapy: [facility] for tasks  assessed/performed ?Overall Cognitive Status: Within Functional Limits for tasks assessed ?  ?  ?  ?  ?  ?  ?  ?  ?  ?  ?  ?  ?  ?  ?  ?  ?General Comments: AxO x 3 very familiar from prior admits.  Very motivated and pleasant. loves to tell stories. ?  ?  ? ?  ?Exercises   ? ?  ?General Comments   ?  ?  ? ?Pertinent Vitals/Pain Pain Assessment ?Pain Assessment: Faces ?Faces Pain Scale: Hurts a little bit ?Pain Location: scrotum/positional ?Pain Descriptors / Indicators: Discomfort ?Pain Intervention(s): Monitored during session  ? ? ?Home Living   ?  ?  ?  ?  ?  ?  ?  ?  ?  ?   ?  ?Prior Function    ?  ?  ?   ? ?PT Goals (current goals can now be found in the care plan section) Progress towards PT goals: Progressing toward goals ? ?  ?Frequency ? ? ? Min 2X/week ? ? ? ?  ?PT Plan Current plan remains appropriate  ? ? ?Co-evaluation   ?  ?  ?  ?  ? ?  ?AM-PAC PT "6 Clicks" Mobility   ?Outcome Measure ? Help needed turning from your back to your side while in a flat bed without using bedrails?: A Little ?Help needed moving from lying on  your back to sitting on the side of a flat bed without using bedrails?: A Little ?Help needed moving to and from a bed to a chair (including a wheelchair)?: A Little ?Help needed standing up from a chair using your arms (e.g., wheelchair or bedside chair)?: A Little ?Help needed to walk in hospital room?: Total ?Help needed climbing 3-5 steps with a railing? : Total ?6 Click Score: 14 ? ?  ?End of Session Equipment Utilized During Treatment: Gait belt ?Activity Tolerance: Patient tolerated treatment well ?Patient left: in bed ?Nurse Communication: Mobility status ?PT Visit Diagnosis: Other abnormalities of gait and mobility (R26.89);Muscle weakness (generalized) (M62.81) ?  ? ? ?Time: 5686-1683 ?PT Time Calculation (min) (ACUTE ONLY): 17 min ? ?Charges:  $Therapeutic Activity: 8-22 mins          ?          ?Rica Koyanagi  PTA ?Acute  Rehabilitation Services ?Pager       [phone] ?Office      [phone] ? ?

## 2021-05-01 DIAGNOSIS — I5043 Acute on chronic combined systolic (congestive) and diastolic (congestive) heart failure: Secondary | ICD-10-CM | POA: Diagnosis not present

## 2021-05-01 NOTE — TOC Progression Note (Addendum)
Transition of Care (TOC) - Progression Note  ? ? ?Patient Details  ?Name: [name] ?MRN: [id number] ?Date of Birth: 04-26-1950 ? ?Transition of Care (TOC) CM/SW Contact  ?Laytoya Ion, Marjie Skiff, RN ?Phone Number: ?05/01/2021, 1:24 PM ? ?Clinical Narrative:    ?[place] to follow up on pt appeal for SNF. Per [facility] the appeal reads as still "pending". Called appeal department at [facility] who alerted me that pt appeal was approved (reference # [id number]). [employer] has not communicated to [facility] that appeal has been approved. ? ?Spoke with Claiborne Billings from [employer] who states that she cannot take pt back until [place] portal says he has been approved, even if [facility] says he is approved. TOC will continue to check [place] portal for approval information. Pt informed of above information. ? ? ?Expected Discharge Plan: [place] ?Barriers to Discharge: Ship broker, SNF Pending bed offer ? ?Expected Discharge Plan and Services ?Expected Discharge Plan: [place] ?  ?Discharge Planning Services: CM Consult ?Post Acute Care Choice: [place] ?Living arrangements for the past 2 months: [place] ?Expected Discharge Date: 04/29/21               ?  ?  ?Readmission Risk Interventions ? ?  11/09/2019  ?  1:42 PM  ?Readmission Risk Prevention Plan  ?Transportation Screening Complete  ?PCP or Specialist Appt within 5-7 Days Complete  ?Home Care Screening Complete  ?Medication Review (RN CM) Complete  ? ? ?

## 2021-05-01 NOTE — Care Management Important Message (Signed)
Important Message ? ?Patient Details IM Letter given to the Patient. ?Name: [name] ?MRN: [id number] ?Date of Birth: 10/04/1950 ? ? ?Medicare Important Message Given:  Yes ? ? ? ? ?[name] ?05/01/2021, 11:07 AM ?

## 2021-05-01 NOTE — Plan of Care (Signed)
?  Problem: Education: ?Goal: Knowledge of General Education information will improve ?Description: Including pain rating scale, medication(s)/side effects and non-pharmacologic comfort measures ?Outcome: Progressing ?  ?Problem: Clinical Measurements: ?Goal: Respiratory complications will improve ?Outcome: Progressing ?  ?Problem: Activity: ?Goal: Risk for activity intolerance will decrease ?Outcome: Progressing ?  ?Problem: Elimination: ?Goal: Will not experience complications related to bowel motility ?Outcome: Progressing ?Goal: Will not experience complications related to urinary retention ?Outcome: Progressing ?  ?Problem: Pain Managment: ?Goal: General experience of comfort will improve ?Outcome: Progressing ?  ?Problem: Safety: ?Goal: Ability to remain free from injury will improve ?Outcome: Progressing ?  ?Problem: Skin Integrity: ?Goal: Risk for impaired skin integrity will decrease ?Outcome: Progressing ?  ?

## 2021-05-01 NOTE — Discharge Instructions (Signed)

## 2021-05-01 NOTE — Progress Notes (Signed)
?PROGRESS NOTE ?[name]  YKZ:[id number] DOB: August 27, 1950 DOA: 04/13/2021 ?PCP: Bernerd Limbo, MD  ? ?Brief Narrative/Hospital Course: ?71 y.o. male who lives in assisted living and has Afib, Hx of DVT, Left AKA, morbid obesity,chronic hypoxic respiratory failure on 2L [place], OSA chronic diastolic HF, hypothyroidism, intermittent scrotal and groin swelling for several months requiring multiple ED visits presented with increased shortness of breath along with groin swelling and redness. CT of abdomen/pelvis showed subcutaneous edema in the right inguinal region/perineum with mild skin thickening but no drainable fluid collection or abscess along with scrotal wall thickening with possible hydrocele.  He was treated for scrotal cellulitis and acute on chronic heart failure. Now awaiting SNF.EP review was denied by insurance and patient family has appealed and waiting for appeal to process through.  Patient is medically stable for discharge  ?  ?Subjective: ?Seen and examined this morning.  Eating his breakfast ?He is completely flat in his bed and no shortness of breath. ?Overnight afebrile.  He sat in the bedside recliner over an hour yesterday with physical therapy.   ?" I have nowhere to go, they kicked me out of the house" ? ?Assessment and Plan: ?Active Problems: ?  Cellulitis of groin, right ?  Acute on chronic combined systolic and diastolic CHF (congestive heart failure) ([place]) ?  OSA (obstructive sleep apnea) ?  Hypothyroidism ?  Chronic respiratory failure with hypoxia (HCC) ?  Persistent atrial fibrillation ([place]) ?  Morbid obesity with BMI of 60.0-69.9, adult ([place]) ?  Pressure injury of skin ?  Physical deconditioning ? ?Cellulitis of groin, right ?Scrotal cellulitis ?S/p CT/scrotal ultrasound  showed possible complex hydroceles.Seen by urology felt to be early cellulitis and fluid overload - s/p 10 days of antibiotic and got diuresed.Continue local care ?  ?Acute on chronic combined systolic and diastolic  CHF: ?Echo showed EF of 45 to 50% with global hypokinesis.Seen by cardiology aggressively diuresed now tolerating oral torsemide losartan Toprol.  Follow-up with cardiology as outpatient.  Continue diet control 2 g salt restriction fluid restriction and weight monitoring for CHF.Net negative balance asNet IO Since Admission: -31,262.6 mL [05/01/21 0753]  ? ?Chronic respiratory failure with hypoxia:question if this is related to OHS ?-Normally uses 2 L oxygen via nasal cannula during daytime and CPAP at night.    ?  ?Hypothyroidism:Cont Synthroid ?  ?LTJ:QZES CPAP ?  ?Persistent atrial fibrillation:Continue Toprol and Eliquis ?- Dig added on 3/15 - rates improved-checking level prior to discharge ?  ?Morbid obesity with BMI of 60.0-69.9, adult:Body mass index is 70.53 kg/m?. continue to weigh at least weekly ?  ?Physical deconditioning: PT recommends SNF placement. Awaiting PLACEMENT.   ?  ?Left AKA-no wound ?  ?Macrocytic anemia: Hemoglobin is stable at 11.7 g.  Anemia panel stable with normal P23 folate and folic acic ? ?Morbid obesity with BMI more than 40: Will benefit with weight loss PC follow-up,  ? ?DVT prophylaxis: eliquis ?Code Status:   Code Status: Full Code ?Family Communication: plan of care discussed with patient at bedside. ?Patient status is: Inpatient level of care: Telemetry  ?Remains inpatient because: Unsafe disposition waiting for appeal ?Patient currently is stable ? ?Dispo: The patient is from: home ?           Anticipated disposition: SNF-TBD ? ?Mobility Assessment (last 72 hours)   ? ? Mobility Assessment   ? ? [place] Name 04/30/21 2122 04/30/21 1909 04/30/21 1604 04/29/21 1908 04/29/21 0705  ? Does patient have an order for bedrest or  is patient medically unstable No - Continue assessment No - Continue assessment -- No - Continue assessment No - Continue assessment  ? What is the highest level of mobility based on the progressive mobility assessment? Level 2 (Chairfast) - Balance while sitting  on edge of bed and cannot stand Level 2 (Chairfast) - Balance while sitting on edge of bed and cannot stand Level 2 (Chairfast) - Balance while sitting on edge of bed and cannot stand Level 2 (Chairfast) - Balance while sitting on edge of bed and cannot stand Level 2 (Chairfast) - Balance while sitting on edge of bed and cannot stand  ? Is the above level different from baseline mobility prior to current illness? No - Consider discontinuing PT/OT Yes - Recommend PT order -- No - Consider discontinuing PT/OT Yes - Recommend PT order  ? ? [place] Name 04/28/21 2030  ?  ?  ?  ?  ? Does patient have an order for bedrest or is patient medically unstable No - Continue assessment      ? What is the highest level of mobility based on the progressive mobility assessment? Level 2 (Chairfast) - Balance while sitting on edge of bed and cannot stand      ? Is the above level different from baseline mobility prior to current illness? Yes - Recommend PT order      ? ?  ?  ? ?  ?  ? ?Objective: ?Vitals last 24 hrs: ?Vitals:  ? 04/30/21 0410 04/30/21 1507 04/30/21 2236 05/01/21 7035  ?BP: 1'18/71 98/60 99/62 '$ 96/61  ?Pulse: 78 62 83 84  ?Resp: '18 20 18 14  '$ ?Temp: (!) 97.5 ?F (36.4 ?C) 97.7 ?F (36.5 ?C) 98.5 ?F (36.9 ?C) (!) 97.5 ?F (36.4 ?C)  ?TempSrc: Axillary Oral Oral Oral  ?SpO2: 94% 95% 93% 96%  ?Weight:      ?Height:      ? ?Weight change:  ? ?Physical Examination: ?General exam: AA0X3,Morbidly obese, older than stated age, weak appearing. ?HEENT:Oral mucosa moist, Ear/Nose WNL grossly, dentition normal. ?Respiratory system: bilaterally diminished, no use of accessory muscle ?Cardiovascular system: S1 & S2 +, No JVD,. ?Gastrointestinal system: Abdomen soft,NT,ND,BS+ ?Nervous System:Alert, awake, moving extremities and grossly nonfocal ?Extremities: LT AKA, rt leg no edema, distal peripheral pulses palpable.  ?Skin: No rashes,no icterus. ?MSK: Normal muscle bulk,tone, power  ? ?Medications reviewed:  ?Scheduled Meds: ? (feeding  supplement) PROSource Plus  30 mL Oral TID BM  ? apixaban  5 mg Oral BID  ? vitamin C  500 mg Oral Daily  ? clotrimazole   Topical BID  ? digoxin  0.125 mg Oral Daily  ? gabapentin  800 mg Oral TID  ? levothyroxine  125 mcg Oral Q0600  ? Living Better with Heart Failure Book   Does not apply Once  ? losartan  25 mg Oral Daily  ? mouth rinse  15 mL Mouth Rinse BID  ? metoprolol succinate  100 mg Oral Daily  ? multivitamin with minerals  1 tablet Oral Daily  ? nutrition supplement (JUVEN)  1 packet Oral BID WC  ? torsemide  20 mg Oral Daily  ? vitamin B-12  1,000 mcg Oral Daily  ? ?Continuous Infusions: ? ?Pressure Injury 04/13/21 Thigh Posterior;Proximal;Right Stage 2 -  Partial thickness loss of dermis presenting as a shallow open injury with a red, pink wound bed without slough. (Active)  ?04/13/21 1700  ?Location: Thigh  ?Location Orientation: Posterior;Proximal;Right  ?Staging: Stage 2 -  Partial thickness loss  of dermis presenting as a shallow open injury with a red, pink wound bed without slough.  ?Wound Description (Comments):   ?Present on Admission: Yes  ?Dressing Type Foam - Lift dressing to assess site every shift 04/30/21 2122  ? ?Diet Order   ? ?       ?  Diet Heart Room service appropriate? Yes; Fluid consistency: Thin; Fluid restriction: 1200 mL Fluid  Diet effective now       ?  ? ?  ?  ? ?  ?  ? ?Nutrition Problem: Increased nutrient needs ?Etiology: wound healing ?Signs/Symptoms: estimated needs ?Interventions: Premier Protein, Juven, Prostat ? ? ?Intake/Output Summary (Last 24 hours) at 05/01/2021 0753 ?Last data filed at 05/01/2021 0510 ?Gross per 24 hour  ?Intake 720 ml  ?Output 2500 ml  ?Net -1780 ml  ? ? ?Net IO Since Admission: -31,262.6 mL [05/01/21 0753]  ?Wt Readings from Last 3 Encounters:  ?04/30/21 (!) 209.6 kg  ?03/19/21 (!) 199.6 kg  ?01/29/21 (!) 199.6 kg  ?  ? ?Unresulted Labs (From admission, onward)  ? ? None  ? ?  ?Data Reviewed: I have personally reviewed following labs and imaging  studies ?CBC: ?Recent Labs  ?Lab 05/22/2021 ?1030  ?WBC 3.5*  ?HGB 11.7*  ?HCT 39.2  ?MCV 103.7*  ?PLT 228  ? ? ?Basic Metabolic Panel: ?Recent Labs  ?Lab 22-May-2021 ?6384  ?NA 138  ?K 4.1  ?CL 97*  ?CO2 31

## 2021-05-02 MED ORDER — FLUTICASONE PROPIONATE 50 MCG/ACT NA SUSP
1.0000 | Freq: Every day | NASAL | Status: DC
  Administered 2021-05-02 – 2021-05-07 (×6): 1 via NASAL
  Filled 2021-05-02: qty 16

## 2021-05-02 NOTE — Progress Notes (Signed)
?PROGRESS NOTE ?[name]  WUJ:[id number] DOB: 1950-11-17 DOA: 04/13/2021 ?PCP: Bernerd Limbo, MD  ? ?Brief Narrative/Hospital Course: ?71 y.o. male who lives in assisted living and has Afib, Hx of DVT, Left AKA, morbid obesity,chronic hypoxic respiratory failure on 2L [place], OSA chronic diastolic HF, hypothyroidism, intermittent scrotal and groin swelling for several months requiring multiple ED visits presented with increased shortness of breath along with groin swelling and redness. CT of abdomen/pelvis showed subcutaneous edema in the right inguinal region/perineum with mild skin thickening but no drainable fluid collection or abscess along with scrotal wall thickening with possible hydrocele.  He was treated for scrotal cellulitis and acute on chronic heart failure. Now awaiting SNF.EP review was denied by insurance and patient family has appealed and waiting for appeal to process through.  Patient is medically stable for discharge  ?  ?Subjective: ?Seen and examined this morning complains of fullness on one of the ear ?No orthopnea no shortness of breath no nausea vomiting ?" I have nowhere to go, they kicked me out of the house" ? ?Assessment and Plan: ?Active Problems: ?  Cellulitis of groin, right ?  Acute on chronic combined systolic and diastolic CHF (congestive heart failure) ([place]) ?  OSA (obstructive sleep apnea) ?  Hypothyroidism ?  Chronic respiratory failure with hypoxia (HCC) ?  Persistent atrial fibrillation ([place]) ?  Morbid obesity with BMI of 60.0-69.9, adult ([place]) ?  Pressure injury of skin ?  Physical deconditioning ? ?Cellulitis of groin, right ?Scrotal cellulitis ?S/p CT/scrotal ultrasound  showed possible complex hydroceles.Seen by urology felt to be early cellulitis and fluid overload - s/p 10 days of antibiotic and got diuresed.Continue local care ?  ?Acute on chronic combined systolic and diastolic CHF: ?Echo showed EF of 45 to 50% with global hypokinesis.Seen by cardiology aggressively  diuresed now tolerating oral torsemide losartan Toprol.  Follow-up with cardiology as outpatient.  Continue diet control 2 g salt restriction fluid restriction and weight monitoring for CHF.Net negative balance asNet IO Since Admission: -33,513.6 mL [05/02/21 0953]  ? ?Chronic respiratory failure with hypoxia:question if this is related to OHS ?-Normally uses 2 L oxygen via nasal cannula during daytime and CPAP at night.    ?  ?Hypothyroidism:Cont Synthroid ?  ?NFA:OZHY CPAP ?  ?Persistent atrial fibrillation:Continue Toprol and Eliquis ?- Dig added on 3/15 - rates improved-checking level prior to discharge ?  ?Morbid obesity with BMI of 60.0-69.9, adult:Body mass index is 70.53 kg/m?. continue to weigh at least weekly ?  ?Physical deconditioning: PT recommends SNF placement. Awaiting PLACEMENT.   ?  ?Left AKA-no wound ?  ?Macrocytic anemia: Hemoglobin is stable at 11.7 g.  Anemia panel stable with normal Q65 folate and folic acic ? ?Morbid obesity with BMI more than 40:Will benefit with weight loss PC follow-up,  ? ?Right ear fullness: cont Flonase ? ?DVT prophylaxis: eliquis ?Code Status:   Code Status: Full Code ?Family Communication: plan of care discussed with patient at bedside. ?Patient status is: Inpatient level of care: Telemetry  ?Remains inpatient because: Unsafe disposition waiting for appeal ?Patient currently is stable ? ?Dispo: The patient is from: home ?           Anticipated disposition: SNF-TBD ? ?Mobility Assessment (last 72 hours)   ? ? Mobility Assessment   ? ? [place] Name 05/01/21 2024 04/30/21 2122 04/30/21 1909 04/30/21 1604 04/29/21 1908  ? Does patient have an order for bedrest or is patient medically unstable No - Continue assessment No - Continue assessment  No - Continue assessment -- No - Continue assessment  ? What is the highest level of mobility based on the progressive mobility assessment? Level 2 (Chairfast) - Balance while sitting on edge of bed and cannot stand Level 2 (Chairfast) -  Balance while sitting on edge of bed and cannot stand Level 2 (Chairfast) - Balance while sitting on edge of bed and cannot stand Level 2 (Chairfast) - Balance while sitting on edge of bed and cannot stand Level 2 (Chairfast) - Balance while sitting on edge of bed and cannot stand  ? Is the above level different from baseline mobility prior to current illness? No - Consider discontinuing PT/OT No - Consider discontinuing PT/OT Yes - Recommend PT order -- No - Consider discontinuing PT/OT  ? ?  ?  ? ?  ?  ? ?Objective: ?Vitals last 24 hrs: ?Vitals:  ? 05/01/21 1334 05/01/21 2012 05/02/21 0500 05/02/21 0506  ?BP: 101/70 96/61  115/71  ?Pulse: 75 86  78  ?Resp: '20 20  16  '$ ?Temp: 98.2 ?F (36.8 ?C) 98 ?F (36.7 ?C)  (!) 97.4 ?F (36.3 ?C)  ?TempSrc: Oral Oral  Oral  ?SpO2: 94% 94%  97%  ?Weight:   (!) 210.6 kg   ?Height:      ? ?Weight change:  ? ?Physical Examination: ?General exam: AA0X3, Obese,older than stated age, weak appearing. ?HEENT:Oral mucosa moist, Ear/Nose WNL grossly, dentition normal. ?Respiratory system: bilaterally diminished breath sounds-difficult to examine due to body habitus,no use of accessory muscle ?Cardiovascular system: S1 & S2 +, No JVD,. ?Gastrointestinal system: Abdomen soft,NT,ND, BS+ ?Nervous System:Alert, awake, moving extremities and grossly nonfocal ?Extremities: Left AKA,  rle no edema ?Skin: No rashes,no icterus. ?MSK: Normal muscle bulk,tone, power ? ?Medications reviewed:  ?Scheduled Meds: ? (feeding supplement) PROSource Plus  30 mL Oral TID BM  ? apixaban  5 mg Oral BID  ? vitamin C  500 mg Oral Daily  ? clotrimazole   Topical BID  ? digoxin  0.125 mg Oral Daily  ? fluticasone  1 spray Each Nare Daily  ? gabapentin  800 mg Oral TID  ? levothyroxine  125 mcg Oral Q0600  ? Living Better with Heart Failure Book   Does not apply Once  ? losartan  25 mg Oral Daily  ? mouth rinse  15 mL Mouth Rinse BID  ? metoprolol succinate  100 mg Oral Daily  ? multivitamin with minerals  1 tablet Oral  Daily  ? nutrition supplement (JUVEN)  1 packet Oral BID WC  ? torsemide  20 mg Oral Daily  ? vitamin B-12  1,000 mcg Oral Daily  ? ?Continuous Infusions: ? ?Pressure Injury 04/13/21 Thigh Posterior;Proximal;Right Stage 2 -  Partial thickness loss of dermis presenting as a shallow open injury with a red, pink wound bed without slough. (Active)  ?04/13/21 1700  ?Location: Thigh  ?Location Orientation: Posterior;Proximal;Right  ?Staging: Stage 2 -  Partial thickness loss of dermis presenting as a shallow open injury with a red, pink wound bed without slough.  ?Wound Description (Comments):   ?Present on Admission: Yes  ?Dressing Type Foam - Lift dressing to assess site every shift 05/01/21 2139  ? ?Diet Order   ? ?       ?  Diet Heart Room service appropriate? Yes; Fluid consistency: Thin; Fluid restriction: 1200 mL Fluid  Diet effective now       ?  ? ?  ?  ? ?  ?  ? ?Nutrition Problem: Increased nutrient  needs ?Etiology: wound healing ?Signs/Symptoms: estimated needs ?Interventions: Premier Protein, Juven, Prostat ? ? ?Intake/Output Summary (Last 24 hours) at 05/02/2021 0953 ?Last data filed at 05/02/2021 9833 ?Gross per 24 hour  ?Intake 480 ml  ?Output 2471 ml  ?Net -1991 ml  ? ? ?Net IO Since Admission: -33,513.6 mL [05/02/21 0953]  ?Wt Readings from Last 3 Encounters:  ?05/02/21 (!) 210.6 kg  ?03/19/21 (!) 199.6 kg  ?01/29/21 (!) 199.6 kg  ?  ? ?Unresulted Labs (From admission, onward)  ? ? None  ? ?  ?Data Reviewed: I have personally reviewed following labs and imaging studies ?CBC: ?Recent Labs  ?Lab 05/19/2021 ?1030  ?WBC 3.5*  ?HGB 11.7*  ?HCT 39.2  ?MCV 103.7*  ?PLT 228  ? ? ?Basic Metabolic Panel: ?Recent Labs  ?Lab 05-19-21 ?8250  ?NA 138  ?K 4.1  ?CL 97*  ?CO2 31  ?GLUCOSE 99  ?BUN 44*  ?CREATININE 0.93  ?CALCIUM 9.9  ? ? ?GFR: ?Estimated Creatinine Clearance: 131 mL/min (by C-G formula based on SCr of 0.93 mg/dL). ?Liver Function Tests: ?No results for input(s): AST, ALT, ALKPHOS, BILITOT, PROT, ALBUMIN in  the last 168 hours. ?No results for input(s): LIPASE, AMYLASE in the last 168 hours. ?No results for input(s): AMMONIA in the last 168 hours. ?Coagulation Profile: ?No results for input(s): INR, PROTI

## 2021-05-02 NOTE — Plan of Care (Signed)
Pt aox4, pleasant and cooperative with staff.  ?Await SNF placement, TOC following.  ? ?Problem: Education: ?Goal: Knowledge of General Education information will improve ?Description: Including pain rating scale, medication(s)/side effects and non-pharmacologic comfort measures ?Outcome: Progressing ?  ?Problem: Clinical Measurements: ?Goal: Respiratory complications will improve ?Outcome: Progressing ?  ?Problem: Activity: ?Goal: Risk for activity intolerance will decrease ?Outcome: Progressing ?  ?Problem: Elimination: ?Goal: Will not experience complications related to bowel motility ?Outcome: Progressing ?Goal: Will not experience complications related to urinary retention ?Outcome: Progressing ?  ?Problem: Pain Managment: ?Goal: General experience of comfort will improve ?Outcome: Progressing ?  ?Problem: Safety: ?Goal: Ability to remain free from injury will improve ?Outcome: Progressing ?  ?Problem: Skin Integrity: ?Goal: Risk for impaired skin integrity will decrease ?Outcome: Progressing ?  ?

## 2021-05-03 NOTE — TOC Progression Note (Addendum)
Transition of Care (TOC) - Progression Note  ? ? ?Patient Details  ?Name: [name] ?MRN: [id number] ?Date of Birth: [birth year] ? ?Transition of Care (TOC) CM/SW Contact  ?Ross Ludwig, LCSW ?Phone Number: ?05/03/2021, 6:44 PM ? ?Clinical Narrative:    ? ?Per insurance company patient won his appeal, however confirmation letter has not been sent to [employer].  Plan to go to [facility] as soon as letter received to [facility] for patient. ? ? ?Expected Discharge Plan: [place] ?Barriers to Discharge: Ship broker, SNF Pending bed offer ? ?Expected Discharge Plan and Services ?Expected Discharge Plan: [place] ?  ?Discharge Planning Services: CM Consult ?Post Acute Care Choice: [place] ?Living arrangements for the past 2 months: [place] ?Expected Discharge Date: 04/29/21               ?  ?  ?  ?  ?  ?  ?  ?  ?  ?  ? ? ?Social Determinants of Health (SDOH) Interventions ?  ? ?Readmission Risk Interventions ? ?  11/09/2019  ?  1:42 PM  ?Readmission Risk Prevention Plan  ?Transportation Screening Complete  ?PCP or Specialist Appt within 5-7 Days Complete  ?Home Care Screening Complete  ?Medication Review (RN CM) Complete  ? ? ?

## 2021-05-03 NOTE — Progress Notes (Signed)
?PROGRESS NOTE ?[name]  WFU:[id number] DOB: [birth year] DOA: 04/13/2021 ?PCP: Bernerd Limbo, MD  ? ?Brief Narrative/Hospital Course: ?71 y.o. male who lives in assisted living and has Afib, Hx of DVT, Left AKA, morbid obesity,chronic hypoxic respiratory failure on 2L [place], OSA chronic diastolic HF, hypothyroidism, intermittent scrotal and groin swelling for several months requiring multiple ED visits presented with increased shortness of breath along with groin swelling and redness. CT of abdomen/pelvis showed subcutaneous edema in the right inguinal region/perineum with mild skin thickening but no drainable fluid collection or abscess along with scrotal wall thickening with possible hydrocele.  He was treated for scrotal cellulitis and acute on chronic heart failure. Now awaiting SNF.EP review was denied by insurance and patient family has appealed and waiting for appeal to process through.  Patient is medically stable for discharge  ?  ?Subjective: ?Patient offers no new complaints.  Ear fullness has improved with Flonase.  At bowel movement yesterday able to lay completely flat currently on home oxygen setting ?" I have nowhere to go, they kicked me out of the house" ? ?Assessment and Plan: ?Active Problems: ?  Cellulitis of groin, right ?  Acute on chronic combined systolic and diastolic CHF (congestive heart failure) ([place]) ?  OSA (obstructive sleep apnea) ?  Hypothyroidism ?  Chronic respiratory failure with hypoxia (HCC) ?  Persistent atrial fibrillation ([place]) ?  Morbid obesity with BMI of 60.0-69.9, adult ([place]) ?  Pressure injury of skin ?  Physical deconditioning ? ?Cellulitis of groin, right ?Scrotal cellulitis ?S/p CT/scrotal ultrasound  showed possible complex hydroceles.Seen by urology felt to be early cellulitis and fluid overload - s/p 10 days of antibiotic and got diuresed.Continue local care ?  ?Acute on chronic combined systolic and diastolic CHF: ?Echo showed EF of 45 to 50% with global  hypokinesis.Seen by cardiology aggressively diuresed now tolerating oral torsemide losartan Toprol.  Follow-up with cardiology as outpatient.  Continue diet control 2 g salt restriction fluid restriction and weight monitoring for CHF.Net negative balance asNet IO Since Admission: -35,503.6 mL [05/03/21 1124]  ? ?Chronic respiratory failure with hypoxia:question if this is related to OHS ?-Normally uses 2 L oxygen via nasal cannula during daytime and CPAP at night.    ?  ?Hypothyroidism:Cont Synthroid ?  ?KGU:RKYH CPAP ?  ?Persistent atrial fibrillation:Continue Toprol and Eliquis ?- Dig added on 3/15 - rates improved-checking level prior to discharge ?  ?Morbid obesity with BMI of 60.0-69.9, adult:Body mass index is 70.53 kg/m?. continue to weigh at least weekly ?  ?Physical deconditioning: PT recommends SNF placement. Awaiting PLACEMENT.   ?  ?Left AKA-no wound ?  ?Macrocytic anemia: Hemoglobin is stable at 11.7 g.  Anemia panel stable with normal C62 folate and folic acic ? ?Morbid obesity with BMI more than 40:Will benefit with weight loss PC follow-up,  ? ?Right ear fullness: cont Flonase ? ?DVT prophylaxis: eliquis ?Code Status:   Code Status: Full Code ?Family Communication: plan of care discussed with patient at bedside. ?Patient status is: Inpatient level of care: Telemetry  ?Remains inpatient because: Unsafe disposition waiting for appeal ?Patient currently is stable ? ?Dispo: The patient is from: home ?           Anticipated disposition: SNF-TBD ? ?Mobility Assessment (last 72 hours)   ? ? Mobility Assessment   ? ? [place] Name 05/02/21 0830 05/01/21 2024 04/30/21 2122 04/30/21 1909 04/30/21 1604  ? Does patient have an order for bedrest or is patient medically unstable No - Continue  assessment No - Continue assessment No - Continue assessment No - Continue assessment --  ? What is the highest level of mobility based on the progressive mobility assessment? Level 2 ([place]) - Balance while sitting on edge of  bed and cannot stand Level 2 ([place]) - Balance while sitting on edge of bed and cannot stand Level 2 ([place]) - Balance while sitting on edge of bed and cannot stand Level 2 ([place]) - Balance while sitting on edge of bed and cannot stand Level 2 ([place]air[place]) - Balance while sitting on edge of bed and cannot stand  ? Is the above level different from baseline mobility prior to current illness? -- No - Consider discontinuing PT/OT No - Consider discontinuing PT/OT Yes - Recommend PT order --  ? ?  ?  ? ?  ?  ? ?Objective: ?Vitals last 24 hrs: ?Vitals:  ? 05/02/21 1444 05/02/21 2008 05/03/21 0500 05/03/21 0557  ?BP: 101/72 101/66  105/68  ?Pulse: 96 74  64  ?Resp: '20 16  16  '$ ?Temp: 97.6 ?F (36.4 ?C) 98.1 ?F (36.7 ?C)  97.8 ?F (36.6 ?C)  ?TempSrc: Oral Oral  Oral  ?SpO2: 96% 95%  93%  ?Weight:   (!) 201.9 kg   ?Height:      ? ?Weight change: -8.754 kg ? ?Physical Examination: ? ?General exam: AA0X3, morbidly obese, older than stated age, weak appearing. ?HEENT:Oral mucosa moist, Ear/Nose WNL grossly, dentition normal. ?Respiratory system: bilaterally diminished, no use of accessory muscle ?Cardiovascular system: S1 & S2 +, No JVD,. ?Gastrointestinal system: Abdomen soft,NT,ND,BS+ ?Nervous System:Alert, awake, moving extremities and grossly nonfocal ?Extremities: LT AKA, RLE no edema ?Skin: No rashes,no icterus. ?MSK: Normal muscle bulk,tone, power  ? ?Medications reviewed:  ?Scheduled Meds: ? (feeding supplement) PROSource Plus  30 mL Oral TID BM  ? apixaban  5 mg Oral BID  ? vitamin C  500 mg Oral Daily  ? clotrimazole   Topical BID  ? digoxin  0.125 mg Oral Daily  ? fluticasone  1 spray Each Nare Daily  ? gabapentin  800 mg Oral TID  ? levothyroxine  125 mcg Oral Q0600  ? Living Better with Heart Failure Book   Does not apply Once  ? losartan  25 mg Oral Daily  ? mouth rinse  15 mL Mouth Rinse BID  ? metoprolol succinate  100 mg Oral Daily  ? multivitamin with minerals  1 tablet Oral Daily  ? nutrition  supplement (JUVEN)  1 packet Oral BID WC  ? torsemide  20 mg Oral Daily  ? vitamin B-12  1,000 mcg Oral Daily  ? ?Continuous Infusions: ? ?Pressure Injury 04/13/21 Thigh Posterior;Proximal;Right Stage 2 -  Partial thickness loss of dermis presenting as a shallow open injury with a red, pink wound bed without slough. (Active)  ?04/13/21 1700  ?Location: Thigh  ?Location Orientation: Posterior;Proximal;Right  ?Staging: Stage 2 -  Partial thickness loss of dermis presenting as a shallow open injury with a red, pink wound bed without slough.  ?Wound Description (Comments):   ?Present on Admission: Yes  ?Dressing Type Foam - Lift dressing to assess site every shift 05/02/21 2200  ? ?Diet Order   ? ?       ?  Diet Heart Room service appropriate? Yes; Fluid consistency: Thin; Fluid restriction: 1200 mL Fluid  Diet effective now       ?  ? ?  ?  ? ?  ?  ? ?Nutrition Problem: Increased nutrient needs ?Etiology: wound healing ?  Signs/Symptoms: estimated needs ?Interventions: Premier Protein, Juven, Prostat ? ? ?Intake/Output Summary (Last 24 hours) at 05/03/2021 1124 ?Last data filed at 05/03/2021 0944 ?Gross per 24 hour  ?Intake 1070 ml  ?Output 2700 ml  ?Net -1630 ml  ? ? ?Net IO Since Admission: -35,503.6 mL [05/03/21 1124]  ?Wt Readings from Last 3 Encounters:  ?05/03/21 (!) 201.9 kg  ?03/19/21 (!) 199.6 kg  ?01/29/21 (!) 199.6 kg  ?  ? ?Unresulted Labs (From admission, onward)  ? ? None  ? ?  ?Data Reviewed: I have personally reviewed following labs and imaging studies ?CBC: ?Recent Labs  ?Lab 05-22-2021 ?1030  ?WBC 3.5*  ?HGB 11.7*  ?HCT 39.2  ?MCV 103.7*  ?PLT 228  ? ? ?Basic Metabolic Panel: ?Recent Labs  ?Lab 2021/05/22 ?8916  ?NA 138  ?K 4.1  ?CL 97*  ?CO2 31  ?GLUCOSE 99  ?BUN 44*  ?CREATININE 0.93  ?CALCIUM 9.9  ? ? ?GFR: ?Estimated Creatinine Clearance: 127.3 mL/min (by C-G formula based on SCr of 0.93 mg/dL). ?Liver Function Tests: ?No results for input(s): AST, ALT, ALKPHOS, BILITOT, PROT, ALBUMIN in the last 168  hours. ?No results for input(s): LIPASE, AMYLASE in the last 168 hours. ?No results for input(s): AMMONIA in the last 168 hours. ?Coagulation Profile: ?No results for input(s): INR, PROTIME in the last 168 hours. ?

## 2021-05-04 ENCOUNTER — Encounter: Payer: Self-pay | Admitting: Nurse Practitioner

## 2021-05-04 NOTE — TOC Progression Note (Signed)
Transition of Care (TOC) - Progression Note  ? ? ?Patient Details  ?Name: [name] ?MRN: [id number] ?Date of Birth: 1950-05-21 ? ?Transition of Care (TOC) CM/SW Contact  ?Ross Ludwig, LCSW ?Phone Number: ?05/04/2021, 5:08 PM ? ?Clinical Narrative:    ? ?CSW called [employer], and they said they still have not received letter from [facility] Medicare that patient's denial was overturned.  CSW then contacted [facility] Medicare and they were able to confirm that patient's denial was overturned on April 29, 2021.  CSW spoke to Nancy Fetter number [phone] at [facility], and he was able to fax the letter verifying patient's appeal was successful.  CSW then contacted Claiborne Billings at [facility] and informed her that the letter was received.  CSW sent a copy to her via email, and she spoke to there administrator, and she said they can not accept patient until it has been updated in [employer]'s portal.  Per Claiborne Billings their [employer] case manager is going to reach out to [facility] and [place] to try to get the letter confirmed on the [place] uploaded the letter to the [place] and then called [employer] again and asked for them to update their system.  Per Nurse, learning disability, they can not update the approval without getting letter from [facility] Medicare themselves.  [employer] associate stated that Erasmo Downer in the appeals department was going to email [facility] Medicare to request the letter so it can be uploaded in the system and approval can be updated.  CSW still waiting for confirmation of approval. ? ?Expected Discharge Plan: [place] ?Barriers to Discharge: Ship broker, SNF Pending bed offer ? ?Expected Discharge Plan and Services ?Expected Discharge Plan: [place] ?  ?Discharge Planning Services: CM Consult ?Post Acute Care Choice: [place] ?Living arrangements for the past 2 months: [place] ?Expected Discharge Date: 04/29/21                ?  ?  ?  ?  ?  ?  ?  ?  ?  ?  ? ? ?Social Determinants of Health (SDOH) Interventions ?  ? ?Readmission Risk Interventions ? ?  11/09/2019  ?  1:42 PM  ?Readmission Risk Prevention Plan  ?Transportation Screening Complete  ?PCP or Specialist Appt within 5-7 Days Complete  ?Home Care Screening Complete  ?Medication Review (RN CM) Complete  ? ? ?

## 2021-05-04 NOTE — Progress Notes (Signed)
Physical Therapy Treatment ?Patient Details ?Name: [name] ?MRN: [id number] ?DOB: October 12, 1950 ?Today's Date: 05/04/2021 ? ? ?History of Present Illness [name] is a 71 y.o. male presents with increased edema to his perineum and lower abdomen. CT of abdomen/pelvis showed subcutaneous edema in the right inguinal region/perineum with mild skin thickening but no drainable fluid collection or abscess along with scrotal wall thickening with possible hydrocele. 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 ?  ?PT Comments  ? ? General Comments: AxO x 3 very familiar from prior admits.  Very motivated and pleasant. loves to tell stories.  Motivated.  Wants to go to Rehab to regain his prior level of Transfer ability. ?General transfer comment: performed FIVE sit to stand activities all lasting > 40 seconds. Assisted back to bed and positioned to comfort.   ?Pt plans to D/C to SNF for ST Rehab. ?  ?Recommendations for follow up therapy are one component of a multi-disciplinary discharge planning process, led by the attending physician.  Recommendations may be updated based on patient status, additional functional criteria and insurance authorization. ? ?Follow Up Recommendations ? Skilled nursing-short term rehab (<3 hours/day) ?  ?  ?Assistance Recommended at Discharge Frequent or constant Supervision/Assistance  ?Patient can return home with the following Two people to help with walking and/or transfers;Two people to help with bathing/dressing/bathroom;Assistance with cooking/housework ?  ?Equipment Recommendations ? None recommended by PT  ?  ?Recommendations for Other Services   ? ? ?  ?Precautions / Restrictions Precautions ?Precautions: Fall ?Precaution Comments: scrotal/groin edema dicomfort ?Restrictions ?Weight Bearing Restrictions: No ?Other Position/Activity Restrictions: L AKA (no prosthesis)  ?  ? ?Mobility ? Bed Mobility ?Overal bed mobility: Needs Assistance ?Bed  Mobility: Rolling, Sit to Supine, Supine to Sit ?Rolling: Supervision, Min guard ?Sidelying to sit: Min guard, Min assist ?  ?  ?  ?General bed mobility comments: pt making progress with all bed mobility.  able to use rails and self scoot to EOB. ?  ? ?Transfers ?  ?  ?  ?  ?  ?  ?  ?  ?  ?General transfer comment: performed FIVE sit to stand activities all lasting > 40 seconds. ?  ? ?Ambulation/Gait ?  ?  ?  ?  ?  ?  ?  ?  ? ? ?Stairs ?  ?  ?  ?  ?  ? ? ?Wheelchair Mobility ?  ? ?Modified Rankin (Stroke Patients Only) ?  ? ? ?  ?Balance   ?  ?  ?  ?  ?  ?  ?  ?  ?  ?  ?  ?  ?  ?  ?  ?  ?  ?  ?  ? ?  ?Cognition Arousal/Alertness: Awake/alert ?Behavior During Therapy: [facility] for tasks assessed/performed ?Overall Cognitive Status: Within Functional Limits for tasks assessed ?  ?  ?  ?  ?  ?  ?  ?  ?  ?  ?  ?  ?  ?  ?  ?  ?General Comments: AxO x 3 very familiar from prior admits.  Very motivated and pleasant. loves to tell stories.  Motivated.  Wants to go to Rehab to regain his prior level of Transfer ability. ?  ?  ? ?  ?Exercises   ? ?  ?General Comments   ?  ?  ? ?Pertinent Vitals/Pain Pain Assessment ?Pain Assessment: Faces ?Faces Pain Scale: Hurts a little bit ?Pain Location:  scrotum/positional ?Pain Descriptors / Indicators: Discomfort ?Pain Intervention(s): Monitored during session  ? ? ?Home Living   ?  ?  ?  ?  ?  ?  ?  ?  ?  ?   ?  ?Prior Function    ?  ?  ?   ? ?PT Goals (current goals can now be found in the care plan section) Progress towards PT goals: Progressing toward goals ? ?  ?Frequency ? ? ? Min 2X/week ? ? ? ?  ?PT Plan Current plan remains appropriate  ? ? ?Co-evaluation   ?  ?  ?  ?  ? ?  ?AM-PAC PT "6 Clicks" Mobility   ?Outcome Measure ? Help needed turning from your back to your side while in a flat bed without using bedrails?: A Little ?Help needed moving from lying on your back to sitting on the side of a flat bed without using bedrails?: A Little ?Help needed moving to and from a bed to a  chair (including a wheelchair)?: A Little ?Help needed standing up from a chair using your arms (e.g., wheelchair or bedside chair)?: A Little ?Help needed to walk in hospital room?: A Little ?Help needed climbing 3-5 steps with a railing? : A Little ?6 Click Score: 18 ? ?  ?End of Session Equipment Utilized During Treatment: Gait belt ?Activity Tolerance: Patient tolerated treatment well ?Patient left: in bed ?Nurse Communication: Mobility status ?PT Visit Diagnosis: Other abnormalities of gait and mobility (R26.89);Muscle weakness (generalized) (M62.81) ?  ? ? ?Time: 7517-0017 ?PT Time Calculation (min) (ACUTE ONLY): 26 min ? ?Charges:  $Therapeutic Activity: 23-37 mins          ?          ?Rica Koyanagi  PTA ?Acute  Rehabilitation Services ?Pager      [phone] ?Office      [phone] ? ?

## 2021-05-04 NOTE — Progress Notes (Signed)
?PROGRESS NOTE ?[name]  ZOX:[id number] DOB: 1950-05-14 DOA: 04/13/2021 ?PCP: Bernerd Limbo, MD  ? ?Brief Narrative/Hospital Course: ?71 y.o. male who lives in assisted living and has Afib, Hx of DVT, Left AKA, morbid obesity,chronic hypoxic respiratory failure on 2L [place], OSA chronic diastolic HF, hypothyroidism, intermittent scrotal and groin swelling for several months requiring multiple ED visits presented with increased shortness of breath along with groin swelling and redness. CT of abdomen/pelvis showed subcutaneous edema in the right inguinal region/perineum with mild skin thickening but no drainable fluid collection or abscess along with scrotal wall thickening with possible hydrocele.  He was treated for scrotal cellulitis and acute on chronic heart failure. Now awaiting SNF.EP review was denied by insurance and patient family has appealed and waiting for appeal to process through.  Patient is medically stable for discharge  ?He is waiting for insurance approval ? ?Subjective: ?Seen and examined.  He has no new complaints.  Resting comfortably.  Hoping for discharge.   ? ?Assessment and Plan: ?Active Problems: ?  Cellulitis of groin, right ?  Acute on chronic combined systolic and diastolic CHF (congestive heart failure) ([place]) ?  OSA (obstructive sleep apnea) ?  Hypothyroidism ?  Chronic respiratory failure with hypoxia (HCC) ?  Persistent atrial fibrillation ([place]) ?  Morbid obesity with BMI of 60.0-69.9, adult ([place]) ?  Pressure injury of skin ?  Physical deconditioning ? ?Cellulitis of groin, right ?Scrotal cellulitis ?S/p CT/scrotal ultrasound  showed possible complex hydroceles.Seen by urology felt to be early cellulitis and fluid overload - s/p 10 days of antibiotic and got diuresed.Continue local care ?  ?Acute on chronic combined systolic and diastolic CHF: ?Echo showed EF of 45 to 50% with global hypokinesis.Seen by cardiology aggressively diuresed now tolerating oral torsemide losartan Toprol.   Follow-up with cardiology as outpatient.  Continue diet control 2 g salt restriction fluid restriction and weight monitoring for CHF.Net negative balance asNet IO Since Admission: -36,323.6 mL [05/04/21 1304].  Remains stable. ? ?Chronic respiratory failure with hypoxia ?OSA: ?question if this is related to OHS,normally uses 2 L oxygen via nasal cannula during daytime and CPAP at night.    ?  ?Hypothyroidism:Cont Synthroid ?Persistent atrial fibrillation:Continue Toprol, digoxin and Eliquis.  ?Morbid obesity with BMI of 60.0-69.9, adult:Body mass index is 70.53 kg/m?. continue to weigh at least weekly ?Physical deconditioning: PT recommends SNF placement. Awaiting PLACEMENT.   ?Left AKA-no wound ?Macrocytic anemia: Hemoglobin is stable at 11.7 g.  Anemia panel stable with normal W11 folate and folic acic ?Morbid obesity with BMI more than 40:Will benefit with weight loss PC follow-up,  ?Right ear fullness: cont Flonase ? ?DVT prophylaxis: eliquis ?Code Status:   Code Status: Full Code ?Family Communication: plan of care discussed with patient at bedside. ?Patient status is: Inpatient level of care: Telemetry  ?Remains inpatient because: Unsafe disposition waiting for appeal ?Patient currently is stable ? ?Dispo: The patient is from: home ?           Anticipated disposition: SNF once approval obtained in setting ? ?Mobility Assessment (last 72 hours)   ? ? Mobility Assessment   ? ? [place] Name 05/04/21 1128 05/03/21 0800 05/02/21 0830 05/01/21 2024  ?  ? Does patient have an order for bedrest or is patient medically unstable -- No - Continue assessment No - Continue assessment No - Continue assessment   ? What is the highest level of mobility based on the progressive mobility assessment? Level 2 (Chairfast) - Balance while sitting on edge  of bed and cannot stand Level 2 (Chairfast) - Balance while sitting on edge of bed and cannot stand Level 2 (Chairfast) - Balance while sitting on edge of bed and cannot stand Level 2  (Chairfast) - Balance while sitting on edge of bed and cannot stand   ? Is the above level different from baseline mobility prior to current illness? -- Yes - Recommend PT order  pt to be discharged to rehab hopefully -- No - Consider discontinuing PT/OT   ? ?  ?  ? ?  ?  ? ?Objective: ?Vitals last 24 hrs: ?Vitals:  ? 05/03/21 1436 05/03/21 2011 05/04/21 0417 05/04/21 0500  ?BP: (!) 144/86 (!) 98/55 96/71   ?Pulse: 79 74 82   ?Resp: '16 20 16   '$ ?Temp: 98.3 ?F (36.8 ?C) 98.1 ?F (36.7 ?C) 97.7 ?F (36.5 ?C)   ?TempSrc: Oral Oral Oral   ?SpO2: 99% 94% 94%   ?Weight:    (!) 201.8 kg  ?Height:      ? ?Weight change: -0.051 kg ? ?Physical Examination: ?General exam: AAOX3, morbidly obese older than stated age, weak appearing. ?HEENT:Oral mucosa moist, Ear/Nose WNL grossly, dentition normal. ?Respiratory system: bilaterally diminished,no use of accessory muscle ?Cardiovascular system: S1 & S2 +, No JVD,. ?Gastrointestinal system: Abdomen soft,NT,ND, BS+ ?Nervous System:Alert, awake, moving extremities and grossly nonfocal ?Extremities: LT AKA,  ?Skin: No rashes,no icterus. ?MSK: Normal muscle bulk,tone, power ? ? ?Medications reviewed:  ?Scheduled Meds: ? (feeding supplement) PROSource Plus  30 mL Oral TID BM  ? apixaban  5 mg Oral BID  ? vitamin C  500 mg Oral Daily  ? clotrimazole   Topical BID  ? digoxin  0.125 mg Oral Daily  ? fluticasone  1 spray Each Nare Daily  ? gabapentin  800 mg Oral TID  ? levothyroxine  125 mcg Oral Q0600  ? Living Better with Heart Failure Book   Does not apply Once  ? losartan  25 mg Oral Daily  ? mouth rinse  15 mL Mouth Rinse BID  ? metoprolol succinate  100 mg Oral Daily  ? multivitamin with minerals  1 tablet Oral Daily  ? nutrition supplement (JUVEN)  1 packet Oral BID WC  ? torsemide  20 mg Oral Daily  ? vitamin B-12  1,000 mcg Oral Daily  ? ?Continuous Infusions: ? ?Pressure Injury 04/13/21 Thigh Posterior;Proximal;Right Stage 2 -  Partial thickness loss of dermis presenting as a shallow  open injury with a red, pink wound bed without slough. (Active)  ?04/13/21 1700  ?Location: Thigh  ?Location Orientation: Posterior;Proximal;Right  ?Staging: Stage 2 -  Partial thickness loss of dermis presenting as a shallow open injury with a red, pink wound bed without slough.  ?Wound Description (Comments):   ?Present on Admission: Yes  ?Dressing Type Foam - Lift dressing to assess site every shift 05/03/21 2125  ? ?Diet Order   ? ?       ?  Diet Heart Room service appropriate? Yes; Fluid consistency: Thin; Fluid restriction: 1200 mL Fluid  Diet effective now       ?  ? ?  ?  ? ?  ?  ? ?Nutrition Problem: Increased nutrient needs ?Etiology: wound healing ?Signs/Symptoms: estimated needs ?Interventions: Premier Protein, Juven, Prostat ? ? ?Intake/Output Summary (Last 24 hours) at 05/04/2021 1304 ?Last data filed at 05/04/2021 0300 ?Gross per 24 hour  ?Intake 1180 ml  ?Output 2000 ml  ?Net -820 ml  ? ? ?Net IO Since Admission: -[id number]  mL [05/04/21 1304]  ?Wt Readings from Last 3 Encounters:  ?05/04/21 (!) 201.8 kg  ?03/19/21 (!) 199.6 kg  ?01/29/21 (!) 199.6 kg  ?  ? ?Unresulted Labs (From admission, onward)  ? ? None  ? ?  ?Data Reviewed: I have personally reviewed following labs and imaging studies ?CBC: ?Recent Labs  ?Lab 2021/05/26 ?1030  ?WBC 3.5*  ?HGB 11.7*  ?HCT 39.2  ?MCV 103.7*  ?PLT 228  ? ? ?Basic Metabolic Panel: ?Recent Labs  ?Lab 2021-05-26 ?2951  ?NA 138  ?K 4.1  ?CL 97*  ?CO2 31  ?GLUCOSE 99  ?BUN 44*  ?CREATININE 0.93  ?CALCIUM 9.9  ? ? ?GFR: ?Estimated Creatinine Clearance: 127.3 mL/min (by C-G formula based on SCr of 0.93 mg/dL). ?Liver Function Tests: ?No results for input(s): AST, ALT, ALKPHOS, BILITOT, PROT, ALBUMIN in the last 168 hours. ?No results for input(s): LIPASE, AMYLASE in the last 168 hours. ?No results for input(s): AMMONIA in the last 168 hours. ?Coagulation Profile: ?No results for input(s): INR, PROTIME in the last 168 hours. ?BNP (last 3 results) ?No results for input(s): PROBNP  in the last 8760 hours. ?HbA1C: ?No results for input(s): HGBA1C in the last 72 hours. ?CBG: ?No results for input(s): GLUCAP in the last 168 hours. ?Lipid Profile: ?No results for input(s): CHOL,

## 2021-05-05 NOTE — Progress Notes (Signed)
Nutrition Follow-up ? ?DOCUMENTATION CODES:  ? ?Morbid obesity ? ?INTERVENTION:  ? ?-Prosource Plus PO TID, each provides 100 kcals and 15g protein ?  ?-Juven BID each serving provides 95kcal and 2.5g of protein (amino acids glutamine and arginine) ? ?-Multivitamin with minerals daily ? ?NUTRITION DIAGNOSIS:  ? ?Increased nutrient needs related to wound healing as evidenced by estimated needs. ? ?Ongoing. ? ?GOAL:  ? ?Patient will meet greater than or equal to 90% of their needs ? ?Progressing. ? ?MONITOR:  ? ?PO intake, Supplement acceptance, Labs, Weight trends, I & O's, Skin ? ? ?ASSESSMENT:  ? ?71 y.o. male with medical history significant of pAfib, Hx of DVT, chronic diastolic HF, hypothyroidism, morbid obesity, chronic hypoxic respiratory failure on 4L [place], intermittent scrotal and groin swelling for several months requiring multiple ED visits presented with increased shortness of breath along with groin swelling and redness. ? ?Patient currently consuming 85-100% of meals at this time. Accepting most protein supplements. Awaiting insurance approval for discharge to SNF. ? ?Admission weight: 402 lbs ?Current weight: 460 lbs  ?Per nursing documentation, pt with mild BLE edema.  ? ?Medications: Vitamin C, Multivitamin with minerals daily, Vitamin B-12 ? ?Labs reviewed. ? ?Diet Order:   ?Diet Order   ? ?       ?  Diet Heart Room service appropriate? Yes; Fluid consistency: Thin; Fluid restriction: 1200 mL Fluid  Diet effective now       ?  ? ?  ?  ? ?  ? ? ?EDUCATION NEEDS:  ? ?Not appropriate for education at this time ? ?Skin:  Skin Assessment: Skin Integrity Issues: ?Skin Integrity Issues:: Stage II ?Stage II: right thigh ? ?Last BM:  3/19 -type 3 ? ?Height:  ? ?Ht Readings from Last 1 Encounters:  ?04/17/21 '5\' 8"'$  (1.727 m)  ? ? ?Weight:  ? ?Wt Readings from Last 1 Encounters:  ?05/05/21 (!) 208.7 kg  ? ? ?BMI:  Body mass index is 69.94 kg/m?. ? ?Estimated Nutritional Needs:  ? ?Kcal:  2100-2300 ? ?Protein:   115-130g ? ?Fluid:  2L/day ? ?[name] Bibles, MS, RD, LDN ?Inpatient Clinical Dietitian ?Contact information available via Amion ? ?

## 2021-05-05 NOTE — TOC Progression Note (Addendum)
Transition of Care (TOC) - Progression Note  ? ? ?Patient Details  ?Name: [name] ?MRN: [id number] ?Date of Birth: 1950/05/06 ? ?Transition of Care (TOC) CM/SW Contact  ?Ross Ludwig, LCSW ?Phone Number: ?05/05/2021, 4:19 PM ? ?Clinical Narrative:    ?Patient is supposed to go to [facility], however Bernadene Bell has not received letter from [facility] that patient has won his appeal.  He won appeal on April 29, 2021.  TOC leadership aware, and so is [facility]. ? ?Per Bernadene Bell they have not received letter from [facility].  CSW called [facility] to see if patient can return, left a voice mail awaiting call back.   ? ? ?Expected Discharge Plan: [place] ?Barriers to Discharge: Ship broker, SNF Pending bed offer ? ?Expected Discharge Plan and Services ?Expected Discharge Plan: [place] ?  ?Discharge Planning Services: CM Consult ?Post Acute Care Choice: [place] ?Living arrangements for the past 2 months: [place] ?Expected Discharge Date: 04/29/21               ?  ?  ?  ?  ?  ?  ?  ?  ?  ?  ? ? ?Social Determinants of Health (SDOH) Interventions ?  ? ?Readmission Risk Interventions ? ?  11/09/2019  ?  1:42 PM  ?Readmission Risk Prevention Plan  ?Transportation Screening Complete  ?PCP or Specialist Appt within 5-7 Days Complete  ?Home Care Screening Complete  ?Medication Review (RN CM) Complete  ? ? ?

## 2021-05-05 NOTE — Plan of Care (Signed)
?  Problem: Education: ?Goal: Knowledge of General Education information will improve ?Description: Including pain rating scale, medication(s)/side effects and non-pharmacologic comfort measures ?Outcome: Progressing ?  ?Problem: Clinical Measurements: ?Goal: Respiratory complications will improve ?Outcome: Progressing ?  ?Problem: Activity: ?Goal: Risk for activity intolerance will decrease ?Outcome: Progressing ?  ?Problem: Elimination: ?Goal: Will not experience complications related to bowel motility ?Outcome: Progressing ?Goal: Will not experience complications related to urinary retention ?Outcome: Progressing ?  ?Problem: Pain Managment: ?Goal: General experience of comfort will improve ?Outcome: Progressing ?  ?Problem: Safety: ?Goal: Ability to remain free from injury will improve ?Outcome: Progressing ?  ?Problem: Skin Integrity: ?Goal: Risk for impaired skin integrity will decrease ?Outcome: Progressing ?  ?

## 2021-05-05 NOTE — Progress Notes (Signed)
?PROGRESS NOTE ?[name]  FXT:[id number] DOB: 1950/03/29 DOA: 04/13/2021 ?PCP: Bernerd Limbo, MD  ? ?Brief Narrative/Hospital Course: ?71 y.o. male who lives in assisted living and has Afib, Hx of DVT, Left AKA, morbid obesity,chronic hypoxic respiratory failure on 2L [place], OSA chronic diastolic HF, hypothyroidism, intermittent scrotal and groin swelling for several months requiring multiple ED visits presented with increased shortness of breath along with groin swelling and redness. CT of abdomen/pelvis showed subcutaneous edema in the right inguinal region/perineum with mild skin thickening but no drainable fluid collection or abscess along with scrotal wall thickening with possible hydrocele.  He was treated for scrotal cellulitis and acute on chronic heart failure. Now awaiting SNF.EP review was denied by insurance and patient family has appealed and waiting for appeal to process through.  Patient is medically stable for discharge  ?He is waiting for insurance approval ? ?Subjective: ?Seen and examined.  He has no new complaints.  He is awaiting for placement.   ?Assessment and Plan: ?Active Problems: ?  Cellulitis of groin, right ?  Acute on chronic combined systolic and diastolic CHF (congestive heart failure) ([place]) ?  OSA (obstructive sleep apnea) ?  Hypothyroidism ?  Chronic respiratory failure with hypoxia (HCC) ?  Persistent atrial fibrillation ([place]) ?  Morbid obesity with BMI of 60.0-69.9, adult ([place]) ?  Pressure injury of skin ?  Physical deconditioning ? ?Cellulitis of groin, right ?Scrotal cellulitis ?S/p CT/scrotal ultrasound  showed possible complex hydroceles.Seen by urology felt to be early cellulitis and fluid overload - s/p 10 days of antibiotic and got diuresed.Continue local care ?  ?Acute on chronic combined systolic and diastolic CHF: ?Echo showed EF of 45 to 50% with global hypokinesis.Seen by cardiology aggressively diuresed now tolerating oral torsemide losartan Toprol.  Follow-up with  cardiology as outpatient.  Continue diet control 2 g salt restriction fluid restriction and weight monitoring for CHF.Net negative balance asNet IO Since Admission: -37,987.6 mL [05/05/21 1241].  Remains stable. ? ?Chronic respiratory failure with hypoxia ?OSA: ?question if this is related to OHS,normally uses 2 L oxygen via nasal cannula during daytime and CPAP at night.    ?Hypothyroidism:Cont Synthroid ?Persistent atrial fibrillation:Continue Toprol, digoxin and Eliquis.  ?Morbid obesity with BMI of 60.0-69.9, adult:Body mass index is 70.53 kg/m?. continue to weigh at least weekly ?Physical deconditioning: PT recommends SNF placement. Awaiting PLACEMENT.   ?Left AKA-no wound ?Macrocytic anemia: Hemoglobin is stable at 11.7 g.  Anemia panel stable with normal G99 folate and folic acic ?Morbid obesity with BMI more than 40:Will benefit with weight loss PC follow-up,  ?Right ear fullness: cont Flonase ? ?DVT prophylaxis: eliquis ?Code Status:   Code Status: Full Code ?Family Communication: plan of care discussed with patient at bedside. ?Patient status is: Inpatient level of care: Telemetry  ?Remains inpatient because: Unsafe disposition waiting for appeal ?Patient currently is stable ? ?Dispo: The patient is from: home ?           Anticipated disposition: SNF once approval obtained in setting ? ?Mobility Assessment (last 72 hours)   ? ? Mobility Assessment   ? ? [place] Name 05/04/21 2000 05/04/21 1128 05/03/21 0800  ?  ?  ? Does patient have an order for bedrest or is patient medically unstable No - Continue assessment -- No - Continue assessment    ? What is the highest level of mobility based on the progressive mobility assessment? Level 3 (Stands with assist) - Balance while standing  and cannot march in place Level  2 ([place]) - Balance while sitting on edge of bed and cannot stand Level 2 ([place]) - Balance while sitting on edge of bed and cannot stand    ? Is the above level different from baseline mobility  prior to current illness? Yes - Recommend PT order -- Yes - Recommend PT order  pt to be discharged to rehab hopefully    ? ?  ?  ? ?  ?  ? ?Objective: ?Vitals last 24 hrs: ?Vitals:  ? 05/04/21 2144 05/05/21 0415 05/05/21 0500 05/05/21 1015  ?BP: 101/60 103/80  109/67  ?Pulse: 69 76  83  ?Resp: 16 18    ?Temp: (!) 97.4 ?F (36.3 ?C) 97.6 ?F (36.4 ?C)    ?TempSrc: Oral Oral    ?SpO2: 95% 97%    ?Weight:   (!) 208.7 kg   ?Height:      ? ?Weight change: 6.855 kg ? ?Physical Examination: ?General exam: AA0x3, obese,older than stated age, weak appearing. ?HEENT:Oral mucosa moist, Ear/Nose WNL grossly, dentition normal. ?Respiratory system: bilaterally diminished,no use of accessory muscle ?Cardiovascular system: S1 & S2 +, No JVD,. ?Gastrointestinal system: Abdomen soft,NT,ND, BS+ ?Nervous System:Alert, awakE ?Extremities: edema neg, LT aka ?Skin: No rashes,no icterus. ?MSK: Normal muscle bulk,tone, power ? ?Medications reviewed:  ?Scheduled Meds: ? (feeding supplement) PROSource Plus  30 mL Oral TID BM  ? apixaban  5 mg Oral BID  ? vitamin C  500 mg Oral Daily  ? clotrimazole   Topical BID  ? digoxin  0.125 mg Oral Daily  ? fluticasone  1 spray Each Nare Daily  ? gabapentin  800 mg Oral TID  ? levothyroxine  125 mcg Oral Q0600  ? Living Better with Heart Failure Book   Does not apply Once  ? losartan  25 mg Oral Daily  ? mouth rinse  15 mL Mouth Rinse BID  ? metoprolol succinate  100 mg Oral Daily  ? multivitamin with minerals  1 tablet Oral Daily  ? nutrition supplement (JUVEN)  1 packet Oral BID WC  ? torsemide  20 mg Oral Daily  ? vitamin B-12  1,000 mcg Oral Daily  ? ?Continuous Infusions: ? ?Pressure Injury 04/13/21 Thigh Posterior;Proximal;Right Stage 2 -  Partial thickness loss of dermis presenting as a shallow open injury with a red, pink wound bed without slough. (Active)  ?04/13/21 1700  ?Location: Thigh  ?Location Orientation: Posterior;Proximal;Right  ?Staging: Stage 2 -  Partial thickness loss of dermis  presenting as a shallow open injury with a red, pink wound bed without slough.  ?Wound Description (Comments):   ?Present on Admission: Yes  ?Dressing Type Foam - Lift dressing to assess site every shift 05/05/21 1015  ? ?Diet Order   ? ?       ?  Diet Heart Room service appropriate? Yes; Fluid consistency: Thin; Fluid restriction: 1200 mL Fluid  Diet effective now       ?  ? ?  ?  ? ?  ?  ? ?Nutrition Problem: Increased nutrient needs ?Etiology: wound healing ?Signs/Symptoms: estimated needs ?Interventions: Premier Protein, Juven, Prostat ? ? ?Intake/Output Summary (Last 24 hours) at 05/05/2021 1241 ?Last data filed at 05/05/2021 1225 ?Gross per 24 hour  ?Intake 2836 ml  ?Output 4500 ml  ?Net -1664 ml  ? ? ?Net IO Since Admission: -37,987.6 mL [05/05/21 1241]  ?Wt Readings from Last 3 Encounters:  ?05/05/21 (!) 208.7 kg  ?03/19/21 (!) 199.6 kg  ?01/29/21 (!) 199.6 kg  ?  ? ?Unresulted  Labs (From admission, onward)  ? ? None  ? ?  ?Data Reviewed: I have personally reviewed following labs and imaging studies ?CBC: ?Recent Labs  ?Lab 2021-05-19 ?1030  ?WBC 3.5*  ?HGB 11.7*  ?HCT 39.2  ?MCV 103.7*  ?PLT 228  ? ? ?Basic Metabolic Panel: ?Recent Labs  ?Lab May 19, 2021 ?5697  ?NA 138  ?K 4.1  ?CL 97*  ?CO2 31  ?GLUCOSE 99  ?BUN 44*  ?CREATININE 0.93  ?CALCIUM 9.9  ? ? ?GFR: ?Estimated Creatinine Clearance: 130.2 mL/min (by C-G formula based on SCr of 0.93 mg/dL). ?Liver Function Tests: ?No results for input(s): AST, ALT, ALKPHOS, BILITOT, PROT, ALBUMIN in the last 168 hours. ?No results for input(s): LIPASE, AMYLASE in the last 168 hours. ?No results for input(s): AMMONIA in the last 168 hours. ?Coagulation Profile: ?No results for input(s): INR, PROTIME in the last 168 hours. ?BNP (last 3 results) ?No results for input(s): PROBNP in the last 8760 hours. ?HbA1C: ?No results for input(s): HGBA1C in the last 72 hours. ?CBG: ?No results for input(s): GLUCAP in the last 168 hours. ?Lipid Profile: ?No results for input(s): CHOL, HDL,  LDLCALC, TRIG, CHOLHDL, LDLDIRECT in the last 72 hours. ?Thyroid Function Tests: ?No results for input(s): TSH, T4TOTAL, FREET4, T3FREE, THYROIDAB in the last 72 hours. ?Sepsis Labs: ?No results for in

## 2021-05-06 NOTE — Progress Notes (Signed)
Physical Therapy Treatment ?Patient Details ?Name: [name] ?MRN: [id number] ?DOB: 10/19/1950 ?Today's Date: 05/06/2021 ? ? ?History of Present Illness Dalon Reichart is a 71 y.o. male presents with increased edema to his perineum and lower abdomen. CT of abdomen/pelvis showed subcutaneous edema in the right inguinal region/perineum with mild skin thickening but no drainable fluid collection or abscess along with scrotal wall thickening with possible hydrocele. 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 ?  ?PT Comments  ? ? Pt was OOB in recliner since 12pm.  Tolerated well.  Attempted sit to stand from recliner level however pt was unable to fully clear hips off enough to stand upright.  Used Maxi Move to assist back to bed. ?Pt slowly progressing and will need ST Rehab at SNF to regain his prior ability to transfer in/out of his wheelchair. ?  ?Recommendations for follow up therapy are one component of a multi-disciplinary discharge planning process, led by the attending physician.  Recommendations may be updated based on patient status, additional functional criteria and insurance authorization. ? ?Follow Up Recommendations ?   ?  ?  ?Assistance Recommended at Discharge    ?Patient can return home with the following   ?  ?Equipment Recommendations ?    ?  ?Recommendations for Other Services   ? ? ?  ?Precautions / Restrictions Precautions ?Precautions: Fall  ?  ? ?Mobility ? Bed Mobility ? ?Transfers ?Overall transfer level: Needs assistance ?  ?Transfers: Sit to/from Stand ?Sit to Stand: Min guard, Min assist, +2 safety/equipment ?  ? Attempted sit to stand from recliner level ?  ?  ?  ? ?Ambulation/Gait ?  ?  ?  ?  ?  ?  ?  ?General Gait Details: non amb ? ? ?Stairs ?  ?  ?  ?  ?  ? ? ?Wheelchair Mobility ?  ? ?Modified Rankin (Stroke Patients Only) ?  ? ? ?  ?Balance   ?  ?  ?  ?  ?  ?  ?  ?  ?  ?  ?  ?  ?  ?  ?  ?  ?  ?  ?  ? ?  ?Cognition Arousal/Alertness: Awake/alert ?   ?Overall Cognitive Status: Within Functional Limits for tasks assessed ?  ?  ?  ?  ?  ?  ?  ?  ?  ?  ?  ?  ?  ?  ?  ?  ?General Comments: AxO x 3 very familiar from prior admits.  Very motivated and pleasant. loves to tell stories.  Motivated.  Wants to go to Rehab to regain his prior level of Transfer ability. ?  ?  ? ?  ?Exercises   ? ?  ?General Comments   ?  ?  ? ?Pertinent Vitals/Pain Pain Assessment ?Pain Assessment: No/denies pain  ? ? ?Home Living   ?  ?  ?  ?  ?  ?  ?  ?  ?  ?   ?  ?Prior Function    ?  ?  ?   ? ?PT Goals (current goals can now be found in the care plan section)   ? ?  ?Frequency ? ? ?   ? ? ? ?  ?PT Plan    ? ? ?Co-evaluation   ?  ?  ?  ?  ? ?  ?AM-PAC PT "6 Clicks" Mobility   ?Outcome Measure ?   ?  ?  ?  ?  ?  ?  ? ?  ?  End of Session   ?  ?Patient left: in bed ?  ?  ?  ? ? ?Time: 1338-1410 ?PT Time Calculation (min) (ACUTE ONLY): 32 min ? ?Charges:  $Therapeutic Activity: 23-37 mins          ?          ? ?Rica Koyanagi  PTA ?Acute  Rehabilitation Services ?Pager      [phone] ?Office      [phone] ? ?

## 2021-05-06 NOTE — Progress Notes (Signed)
Physical Therapy Treatment ?Patient Details ?Name: [name] ?MRN: [id number] ?DOB: Sep 22, 1950 ?Today's Date: 05/06/2021 ? ? ?History of Present Illness Kemar Pandit is a 71 y.o. male presents with increased edema to his perineum and lower abdomen. CT of abdomen/pelvis showed subcutaneous edema in the right inguinal region/perineum with mild skin thickening but no drainable fluid collection or abscess along with scrotal wall thickening with possible hydrocele. 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 ?  ?PT Comments  ? ? Pt is AxO x 3 very pleasant and motivated to work with Physical Therapy.  "I want to be able to get in and out of my wheelchair like before coming here".  Pt resided at [facility] and would propel self in hallways.   ?Assisted OOB is improving but not yet at prior ability.  Pt will need ST Rehab at SNF prior to safely returning to ALF level. ?  ?Recommendations for follow up therapy are one component of a multi-disciplinary discharge planning process, led by the attending physician.  Recommendations may be updated based on patient status, additional functional criteria and insurance authorization. ? ?Follow Up Recommendations ? Skilled nursing-short term rehab (<3 hours/day) ?  ?  ?Assistance Recommended at Discharge Frequent or constant Supervision/Assistance  ?Patient can return home with the following Two people to help with walking and/or transfers;Two people to help with bathing/dressing/bathroom;Assistance with cooking/housework ?  ?Equipment Recommendations ? None recommended by PT  ?  ?Recommendations for Other Services   ? ? ?  ?Precautions / Restrictions Precautions ?Precautions: Fall ?Precaution Comments: scrotal/groin edema dicomfort ?Restrictions ?Weight Bearing Restrictions: Yes ?Other Position/Activity Restrictions: L AKA (no prosthesis)  ?  ? ?Mobility ? Bed Mobility ?Overal bed mobility: Needs Assistance ?Bed Mobility: Supine to Sit ?  ?  ?Supine to  sit: Max assist ?  ?  ?General bed mobility comments: only required Max Assist for upper body supine to sir EOB.  Pt improved ability to self roll side to side using bed rails and scoot to EOB using momentum. ?  ? ?Transfers ?Overall transfer level: Needs assistance ?  ?Transfers: Sit to/from Stand ?Sit to Stand: Min guard, Min assist, +2 safety/equipment ?  ?  ?  ?  ?  ?General transfer comment: pt was able to stand from elevated bed using bed rails for support and pivot 1/4 turn to his RIGHT to recliner + 2 assist for safety. ?  ? ?Ambulation/Gait ?  ?  ?  ?  ?  ?  ?  ?General Gait Details: non amb ? ? ?Stairs ?  ?  ?  ?  ?  ? ? ?Wheelchair Mobility ?  ? ?Modified Rankin (Stroke Patients Only) ?  ? ? ?  ?Balance   ?  ?  ?  ?  ?  ?  ?  ?  ?  ?  ?  ?  ?  ?  ?  ?  ?  ?  ?  ? ?  ?Cognition Arousal/Alertness: Awake/alert ?  ?Overall Cognitive Status: Within Functional Limits for tasks assessed ?  ?  ?  ?  ?  ?  ?  ?  ?  ?  ?  ?  ?  ?  ?  ?  ?General Comments: AxO x 3 very familiar from prior admits.  Very motivated and pleasant. loves to tell stories.  Motivated.  Wants to go to Rehab to regain his prior level of Transfer ability. ?  ?  ? ?  ?  Exercises   ? ?  ?General Comments  Ectended treatment session, assisted pt with a full bath while seated edge of recliner ?  ?  ? ?Pertinent Vitals/Pain Pain Assessment ?Pain Assessment: No/denies pain  ? ? ?Home Living   ?  ?  ?  ?  ?  ?  ?  ?  ?  ?   ?  ?Prior Function    ?  ?  ?   ? ?PT Goals (current goals can now be found in the care plan section) Progress towards PT goals: Progressing toward goals ? ?  ?Frequency ? ? ? Min 2X/week ? ? ? ?  ?PT Plan Current plan remains appropriate  ? ? ?Co-evaluation   ?  ?  ?  ?  ? ?  ?AM-PAC PT "6 Clicks" Mobility   ?Outcome Measure ? Help needed turning from your back to your side while in a flat bed without using bedrails?: A Lot ?Help needed moving from lying on your back to sitting on the side of a flat bed without using bedrails?: A  Lot ?Help needed moving to and from a bed to a chair (including a wheelchair)?: A Lot ?Help needed standing up from a chair using your arms (e.g., wheelchair or bedside chair)?: A Lot ?Help needed to walk in hospital room?: A Lot ?Help needed climbing 3-5 steps with a railing? : Total ?6 Click Score: 11 ? ?  ?End of Session Equipment Utilized During Treatment: Gait belt ?Activity Tolerance: Patient tolerated treatment well ?Patient left: in chair ?Nurse Communication: Mobility status;Need for lift equipment ?PT Visit Diagnosis: Other abnormalities of gait and mobility (R26.89);Muscle weakness (generalized) (M62.81) ?  ? ? ?Time: 1129-1206 ?PT Time Calculation (min) (ACUTE ONLY): 37 min ? ?Charges:  $Therapeutic Activity: 23-37 mins          ?          ? ?{Desiree Daise  PTA ?Acute  Rehabilitation Services ?Pager      [phone] ?Office      [phone] ? ? ?

## 2021-05-06 NOTE — Plan of Care (Signed)
?  Problem: Education: ?Goal: Knowledge of General Education information will improve ?Description: Including pain rating scale, medication(s)/side effects and non-pharmacologic comfort measures ?Outcome: Progressing ?  ?Problem: Clinical Measurements: ?Goal: Respiratory complications will improve ?Outcome: Progressing ?  ?Problem: Activity: ?Goal: Risk for activity intolerance will decrease ?Outcome: Progressing ?  ?Problem: Elimination: ?Goal: Will not experience complications related to bowel motility ?Outcome: Progressing ?Goal: Will not experience complications related to urinary retention ?Outcome: Progressing ?  ?Problem: Pain Managment: ?Goal: General experience of comfort will improve ?Outcome: Progressing ?  ?Problem: Safety: ?Goal: Ability to remain free from injury will improve ?Outcome: Progressing ?  ?Problem: Skin Integrity: ?Goal: Risk for impaired skin integrity will decrease ?Outcome: Progressing ?  ?

## 2021-05-06 NOTE — Progress Notes (Signed)
?PROGRESS NOTE ?[name]  FVC:[id number] DOB: 12-Dec-1950 DOA: 04/13/2021 ?PCP: Bernerd Limbo, MD  ? ?Brief Narrative/Hospital Course: ?71 y.o. male who lives in assisted living and has Afib, Hx of DVT, Left AKA, morbid obesity,chronic hypoxic respiratory failure on 2L [place], OSA chronic diastolic HF, hypothyroidism, intermittent scrotal and groin swelling for several months requiring multiple ED visits presented with increased shortness of breath along with groin swelling and redness. CT of abdomen/pelvis showed subcutaneous edema in the right inguinal region/perineum with mild skin thickening but no drainable fluid collection or abscess along with scrotal wall thickening with possible hydrocele.  He was treated for scrotal cellulitis and acute on chronic heart failure. Now awaiting SNF.EP review was denied by insurance and patient family has appealed and waiting for appeal to process through.  Patient is medically stable for discharge  ?He is waiting for insurance approval ? ?Subjective: ?Overnight afebrile.  His blood pressure is soft this morning in 90s mostly in low 100S. saturating well on home oxygen setting ?Today morning he has no new complaint. ? ?Assessment and Plan: ?Active Problems: ?  Cellulitis of groin, right ?  Acute on chronic combined systolic and diastolic CHF (congestive heart failure) ([place]) ?  OSA (obstructive sleep apnea) ?  Hypothyroidism ?  Chronic respiratory failure with hypoxia ([facility]) ?  Persistent atrial fibrillation ([place]) ?  Morbid obesity with BMI of 60.0-69.9, adult ([place]) ?  Pressure injury of skin ?  Physical deconditioning ? ?Cellulitis of groin, right ?Scrotal cellulitis ?S/p CT/scrotal ultrasound  showed possible complex hydroceles.Seen by urology felt to be early cellulitis and fluid overload - s/p 10 days of antibiotic and got diuresed.Continue local care ?  ?Acute on chronic combined systolic and diastolic CHF: ?Echo showed EF of 45 to 50% with global hypokinesis.Seen by  cardiology aggressively diuresed now tolerating oral torsemide losartan Toprol.  Follow-up with cardiology as outpatient.  Continue diet control 2 g salt restriction fluid restriction and weight monitoring for CHF.Net negative balance asNet IO Since Admission: -39,939.6 mL [05/06/21 1135].  Remains stable. ? ?Chronic respiratory failure with hypoxia ?OSA: ?question if this is related to OHS,normally uses 2 L oxygen via nasal cannula during daytime and CPAP at night.    ?Hypothyroidism:Cont Synthroid ?Persistent atrial fibrillation:Continue Toprol, digoxin and Eliquis.  ?Morbid obesity with BMI of 60.0-69.9, adult:Body mass index is 70.53 kg/m?. continue to weigh at least weekly ?Physical deconditioning: PT recommends SNF placement. Awaiting PLACEMENT.   ?Left AKA-no wound ?Macrocytic anemia: Hemoglobin is stable at 11.7 g.  Anemia panel stable with normal M38 folate and folic acic ?Morbid obesity with BMI more than 40:Will benefit with weight loss PC follow-up,  ?Right ear fullness: cont Flonase ? ?DVT prophylaxis: eliquis ?Code Status:   Code Status: Full Code ?Family Communication: plan of care discussed with patient at bedside. ?Patient status is: Inpatient level of care: Telemetry  ?Remains inpatient because: Unsafe disposition waiting for appeal ?Patient currently is stable ? ?Dispo: The patient is from: home ?           Anticipated disposition: SNF once approval obtained in setting ? ?Mobility Assessment (last 72 hours)   ? ? Mobility Assessment   ? ? [place] Name 05/04/21 2000 05/04/21 1128  ?  ?  ?  ? Does patient have an order for bedrest or is patient medically unstable No - Continue assessment --     ? What is the highest level of mobility based on the progressive mobility assessment? Level 3 (Stands with assist) -  Balance while standing  and cannot march in place Level 2 (Chairfast) - Balance while sitting on edge of bed and cannot stand     ? Is the above level different from baseline mobility prior to  current illness? Yes - Recommend PT order --     ? ?  ?  ? ?  ?  ? ?Objective: ?Vitals last 24 hrs: ?Vitals:  ? 05/05/21 2025 05/05/21 2218 05/06/21 6579 05/06/21 0932  ?BP: 103/69  (!) 92/51 103/66  ?Pulse: 74 79 88 79  ?Resp: '18 18 18   '$ ?Temp: 98.1 ?F (36.7 ?C)  97.6 ?F (36.4 ?C)   ?TempSrc: Oral  Oral   ?SpO2: 95%  94%   ?Weight:      ?Height:      ? ?Weight change:  ? ?Physical Examination: ?General exam: AA0X3,older than stated age, weak appearing. ?HEENT:Oral mucosa moist, Ear/Nose WNL grossly, dentition normal. ?Respiratory system: bilaterally CLEAR,no use of accessory muscle ?Cardiovascular system: S1 & S2 +, No JVD,. ?Gastrointestinal system: Abdomen soft,NT,ND, BS+ ?Nervous System:Alert, awake, moving extremities and grossly nonfocal ?Extremities: lt AKA, no edema neg,distal peripheral pulses palpable.  ?Skin: No rashes,no icterus. ?MSK: Normal muscle bulk,tone, power ? ?Medications reviewed:  ?Scheduled Meds: ? (feeding supplement) PROSource Plus  30 mL Oral TID BM  ? apixaban  5 mg Oral BID  ? vitamin C  500 mg Oral Daily  ? clotrimazole   Topical BID  ? digoxin  0.125 mg Oral Daily  ? fluticasone  1 spray Each Nare Daily  ? gabapentin  800 mg Oral TID  ? levothyroxine  125 mcg Oral Q0600  ? Living Better with Heart Failure Book   Does not apply Once  ? losartan  25 mg Oral Daily  ? mouth rinse  15 mL Mouth Rinse BID  ? metoprolol succinate  100 mg Oral Daily  ? multivitamin with minerals  1 tablet Oral Daily  ? nutrition supplement (JUVEN)  1 packet Oral BID WC  ? torsemide  20 mg Oral Daily  ? vitamin B-12  1,000 mcg Oral Daily  ? ?Continuous Infusions: ? ?Pressure Injury 04/13/21 Thigh Posterior;Proximal;Right Stage 2 -  Partial thickness loss of dermis presenting as a shallow open injury with a red, pink wound bed without slough. (Active)  ?04/13/21 1700  ?Location: Thigh  ?Location Orientation: Posterior;Proximal;Right  ?Staging: Stage 2 -  Partial thickness loss of dermis presenting as a shallow  open injury with a red, pink wound bed without slough.  ?Wound Description (Comments):   ?Present on Admission: Yes  ?Dressing Type -- (foam dsg) 05/06/21 0757  ? ?Diet Order   ? ?       ?  Diet Heart Room service appropriate? Yes; Fluid consistency: Thin; Fluid restriction: 1200 mL Fluid  Diet effective now       ?  ? ?  ?  ? ?  ?  ? ?Nutrition Problem: Increased nutrient needs ?Etiology: wound healing ?Signs/Symptoms: estimated needs ?Interventions: Premier Protein, Juven, Prostat ? ? ?Intake/Output Summary (Last 24 hours) at 05/06/2021 1135 ?Last data filed at 05/06/2021 0900 ?Gross per 24 hour  ?Intake 948 ml  ?Output 3600 ml  ?Net -2652 ml  ? ?Net IO Since Admission: -39,939.6 mL [05/06/21 1135]  ?Wt Readings from Last 3 Encounters:  ?05/05/21 (!) 208.7 kg  ?03/19/21 (!) 199.6 kg  ?01/29/21 (!) 199.6 kg  ?  ? ?Unresulted Labs (From admission, onward)  ? ? None  ? ?  ?Data Reviewed: I have  personally reviewed following labs and imaging studies ?CBC: ?No results for input(s): WBC, NEUTROABS, HGB, HCT, MCV, PLT in the last 168 hours. ?Basic Metabolic Panel: ?No results for input(s): NA, K, CL, CO2, GLUCOSE, BUN, CREATININE, CALCIUM, MG, PHOS in the last 168 hours. ? ?GFR: ?Estimated Creatinine Clearance: 130.2 mL/min (by C-G formula based on SCr of 0.93 mg/dL). ?Liver Function Tests: ?No results for input(s): AST, ALT, ALKPHOS, BILITOT, PROT, ALBUMIN in the last 168 hours. ?No results for input(s): LIPASE, AMYLASE in the last 168 hours. ?No results for input(s): AMMONIA in the last 168 hours. ?Coagulation Profile: ?No results for input(s): INR, PROTIME in the last 168 hours. ?BNP (last 3 results) ?No results for input(s): PROBNP in the last 8760 hours. ?HbA1C: ?No results for input(s): HGBA1C in the last 72 hours. ?CBG: ?No results for input(s): GLUCAP in the last 168 hours. ?Lipid Profile: ?No results for input(s): CHOL, HDL, LDLCALC, TRIG, CHOLHDL, LDLDIRECT in the last 72 hours. ?Thyroid Function Tests: ?No  results for input(s): TSH, T4TOTAL, FREET4, T3FREE, THYROIDAB in the last 72 hours. ?Sepsis Labs: ?No results for input(s): PROCALCITON, LATICACIDVEN in the last 168 hours. ? ?No results found for this or any previou

## 2021-05-07 NOTE — TOC Transition Note (Signed)
Transition of Care (TOC) - CM/SW Discharge Note ? ? ?Patient Details  ?Name: [name] ?MRN: [id number] ?Date of Birth: 08/03/1950 ? ?Transition of Care (TOC) CM/SW Contact:  ?Trish Mage, LCSW ?Phone Number: ?05/07/2021, 11:26 AM ? ? ?Clinical Narrative:   Patient who is stable for d/c will transfer to [facility] today.  Family informed.  PTAR arranged.  Nursing, please call report to [phone].  TOC sign off. ? ? ? ?Final next level of care: [place] ?Barriers to Discharge: Barriers Resolved ? ? ?Patient Goals and CMS Choice ?Patient states their goals for this hospitalization and ongoing recovery are:: Go back to [employer] ?CMS Medicare.gov Compare Post Acute Care list provided to:: Patient ?Choice offered to / list presented to : Patient ? ?Discharge Placement ?  ?           ?  ?  ?  ?  ? ?Discharge Plan and Services ?  ?Discharge Planning Services: CM Consult ?Post Acute Care Choice: [place]          ?  ?  ?  ?  ?  ?  ?  ?  ?  ?  ? ?Social Determinants of Health (SDOH) Interventions ?  ? ? ?Readmission Risk Interventions ? ?  11/09/2019  ?  1:42 PM  ?Readmission Risk Prevention Plan  ?Transportation Screening Complete  ?PCP or Specialist Appt within 5-7 Days Complete  ?Home Care Screening Complete  ?Medication Review (RN CM) Complete  ? ? ? ? ? ?

## 2021-05-07 NOTE — Progress Notes (Signed)
[place] and spoke with [facility] LPN, report given for transfer. [place] aware that PTAR has been called.  ?

## 2021-05-07 NOTE — Progress Notes (Signed)
?PROGRESS NOTE ?[name]  FIE:[id number] DOB: [birth year] DOA: 04/13/2021 ?PCP: Bernerd Limbo, MD  ? ?Brief Narrative/Hospital Course: ?71 y.o. male who lives in assisted living and has Afib, Hx of DVT, Left AKA, morbid obesity,chronic hypoxic respiratory failure on 2L [place], OSA chronic diastolic HF, hypothyroidism, intermittent scrotal and groin swelling for several months requiring multiple ED visits presented with increased shortness of breath along with groin swelling and redness. CT of abdomen/pelvis showed subcutaneous edema in the right inguinal region/perineum with mild skin thickening but no drainable fluid collection or abscess along with scrotal wall thickening with possible hydrocele.  He was treated for scrotal cellulitis and acute on chronic heart failure. Now awaiting SNF.EP review was denied by insurance and patient family has appealed and waiting for appeal to process through.  Patient is medically stable for dischargeHe is waiting for insurance approval ? ?Subjective: ?Seen and examined this morning.  Appears somewhat frustrated waiting for placement and he is thinking of leaving hospital. Overnight afebrile blood pressure stable on home oxygen setting of 2 L ? ?Assessment and Plan: ?Active Problems: ?  Cellulitis of groin, right ?  Acute on chronic combined systolic and diastolic CHF (congestive heart failure) ([place]) ?  OSA (obstructive sleep apnea) ?  Hypothyroidism ?  Chronic respiratory failure with hypoxia (H[facility]) ?  Persistent atrial fibrillation ([place]) ?  Morbid obesity with BMI of 60.0-69.9, adult ([place]) ?  Pressure injury of skin ?  Physical deconditioning ? ?Cellulitis of groin, right ?Scrotal cellulitis ?S/p CT/scrotal ultrasound  showed possible complex hydroceles.Seen by urology felt to be early cellulitis and fluid overload - s/p 10 days of antibiotic and got diuresed.Continue local care ?  ?Acute on chronic combined systolic and diastolic CHF: ?Echo showed EF of 45 to 50% with global  hypokinesis.Seen by cardiology aggressively diuresed now on oral regimen with torsemide 20 mg daily, Toprol 100 mg daily, losartan 25.Continue diet control 2 g salt restriction fluid restriction and weight monitoring for CHF.Net IO Since Admission: -41,699.6 mL [05/07/21 0819].  CHF is compensated at this time ? ?Chronic respiratory failure with hypoxia ?OSA: ?question if this is related to OHS,normally uses 2 L oxygen via nasal cannula during daytime and CPAP at night.    ?Hypothyroidism:Cont Synthroid ?Persistent atrial fibrillation: Rate controlled, continue Toprol, digoxin and Eliquis.  ?Morbid obesity with BMI of 60.0-69.9, adult:Body mass index is 70.53 kg/m?. continue to weigh at least weekly ?Physical deconditioning: PT recommends SNF placement. Awaiting PLACEMENT.   ?Left AKA-no wound, cont care. ?Macrocytic anemia: Hemoglobin is stable at 11.7 g.  Anemia panel stable with normal Z66 folate and folic acic ?Morbid obesity with BMI more than 40:Will benefit with weight loss PC follow-up,  ?Right ear fullness: cont Flonase ? ?DVT prophylaxis: eliquis ?Code Status:   Code Status: Full Code ?Family Communication: plan of care discussed with patient at bedside. ?Patient status is: Inpatient level of care: Telemetry  ?Remains inpatient because: Unsafe disposition waiting for appeal/snf ?Patient currently is stable ? ?Dispo: The patient is from: home ?           Anticipated disposition: SNF once approval obtained.  Patient has been medically stable ? ?Mobility Assessment (last 72 hours)   ? ? Mobility Assessment   ? ? [place] Name 05/04/21 2000 05/04/21 1128  ?  ?  ?  ? Does patient have an order for bedrest or is patient medically unstable No - Continue assessment --     ? What is the highest level of mobility based  on the progressive mobility assessment? Level 3 (Stands with assist) - Balance while standing  and cannot march in place Level 2 (Chairfast) - Balance while sitting on edge of bed and cannot stand     ? Is  the above level different from baseline mobility prior to current illness? Yes - Recommend PT order --     ? ?  ?  ? ?  ?Objective: ?Vitals last 24 hrs: ?Vitals:  ? 05/06/21 2027 05/06/21 2216 05/07/21 0459 05/07/21 0500  ?BP: 100/61  102/68   ?Pulse: 87 71 76   ?Resp: '20 20 19   '$ ?Temp: 98.4 ?F (36.9 ?C)  97.7 ?F (36.5 ?C)   ?TempSrc:      ?SpO2: 96% 92% 94%   ?Weight:    (!) 210 kg  ?Height:      ? ?Weight change:  ? ?Physical Examination: ?General exam: AAOX3,morbidly obese older than stated age, weak appearing. ?HEENT:Oral mucosa moist, Ear/Nose WNL grossly, dentition normal. ?Respiratory system: bilaterally diminished,no use of accessory muscle ?Cardiovascular system: S1 & S2 +, No JVD,. ?Gastrointestinal system: Abdomen soft,NT,ND, BS+ ?Nervous System:Alert, awake, moving extremities and grossly nonfocal ?Extremities: lt AKA, edema neg,distal peripheral pulses palpable.  ?Skin: No rashes,no icterus. ?MSK: Normal muscle bulk,tone, power ? ?Medications reviewed:  ?Scheduled Meds: ? (feeding supplement) PROSource Plus  30 mL Oral TID BM  ? apixaban  5 mg Oral BID  ? vitamin C  500 mg Oral Daily  ? clotrimazole   Topical BID  ? digoxin  0.125 mg Oral Daily  ? fluticasone  1 spray Each Nare Daily  ? gabapentin  800 mg Oral TID  ? levothyroxine  125 mcg Oral Q0600  ? Living Better with Heart Failure Book   Does not apply Once  ? losartan  25 mg Oral Daily  ? mouth rinse  15 mL Mouth Rinse BID  ? metoprolol succinate  100 mg Oral Daily  ? multivitamin with minerals  1 tablet Oral Daily  ? nutrition supplement (JUVEN)  1 packet Oral BID WC  ? torsemide  20 mg Oral Daily  ? vitamin B-12  1,000 mcg Oral Daily  ? ?Continuous Infusions: ? ?Pressure Injury 04/13/21 Thigh Posterior;Proximal;Right Stage 2 -  Partial thickness loss of dermis presenting as a shallow open injury with a red, pink wound bed without slough. (Active)  ?04/13/21 1700  ?Location: Thigh  ?Location Orientation: Posterior;Proximal;Right  ?Staging: Stage  2 -  Partial thickness loss of dermis presenting as a shallow open injury with a red, pink wound bed without slough.  ?Wound Description (Comments):   ?Present on Admission: Yes  ?Dressing Type Foam - Lift dressing to assess site every shift 05/06/21 2141  ? ?Diet Order   ? ?       ?  Diet Heart Room service appropriate? Yes; Fluid consistency: Thin; Fluid restriction: 1200 mL Fluid  Diet effective now       ?  ? ?  ?  ? ?  ?  ? ?Nutrition Problem: Increased nutrient needs ?Etiology: wound healing ?Signs/Symptoms: estimated needs ?Interventions: Premier Protein, Juven, Prostat ? ? ?Intake/Output Summary (Last 24 hours) at 05/07/2021 0819 ?Last data filed at 05/07/2021 0509 ?Gross per 24 hour  ?Intake 240 ml  ?Output 2550 ml  ?Net -2310 ml  ? ? ?Net IO Since Admission: -41,699.6 mL [05/07/21 0819]  ?Wt Readings from Last 3 Encounters:  ?05/07/21 (!) 210 kg  ?03/19/21 (!) 199.6 kg  ?01/29/21 (!) 199.6 kg  ?  ? ?  Unresulted Labs (From admission, onward)  ? ? None  ? ?  ?Data Reviewed: I have personally reviewed following labs and imaging studies ?CBC: ?No results for input(s): WBC, NEUTROABS, HGB, HCT, MCV, PLT in the last 168 hours. ?Basic Metabolic Panel: ?No results for input(s): NA, K, CL, CO2, GLUCOSE, BUN, CREATININE, CALCIUM, MG, PHOS in the last 168 hours. ? ?GFR: ?Estimated Creatinine Clearance: 130.7 mL/min (by C-G formula based on SCr of 0.93 mg/dL). ?Liver Function Tests: ?No results for input(s): AST, ALT, ALKPHOS, BILITOT, PROT, ALBUMIN in the last 168 hours. ?No results for input(s): LIPASE, AMYLASE in the last 168 hours. ?No results for input(s): AMMONIA in the last 168 hours. ?Coagulation Profile: ?No results for input(s): INR, PROTIME in the last 168 hours. ?BNP (last 3 results) ?No results for input(s): PROBNP in the last 8760 hours. ?HbA1C: ?No results for input(s): HGBA1C in the last 72 hours. ?CBG: ?No results for input(s): GLUCAP in the last 168 hours. ?Lipid Profile: ?No results for input(s): CHOL,  HDL, LDLCALC, TRIG, CHOLHDL, LDLDIRECT in the last 72 hours. ?Thyroid Function Tests: ?No results for input(s): TSH, T4TOTAL, FREET4, T3FREE, THYROIDAB in the last 72 hours. ?Sepsis Labs: ?No results

## 2021-05-31 IMAGING — US IR PICC >5YO
1 series · 2 of 2 positions shown · IV contrast (agent unspecified)
Comparison: none

INDICATION: Poor venous access. In need of durable intravenous access for
medication administration.

EXAM:
ULTRASOUND AND FLUOROSCOPIC GUIDED PICC LINE INSERTION
MEDICATIONS:
None.
CONTRAST:  None
FLUOROSCOPY TIME:  24 seconds (20 mGy)
COMPLICATIONS:
None immediate.
TECHNIQUE: The procedure, risks, benefits, and alternatives were explained to
the patient and informed written consent was obtained. A timeout was
performed prior to the initiation of the procedure.

[Series 1: (id) · 2 of 2 slices shown]
[im 1/2]
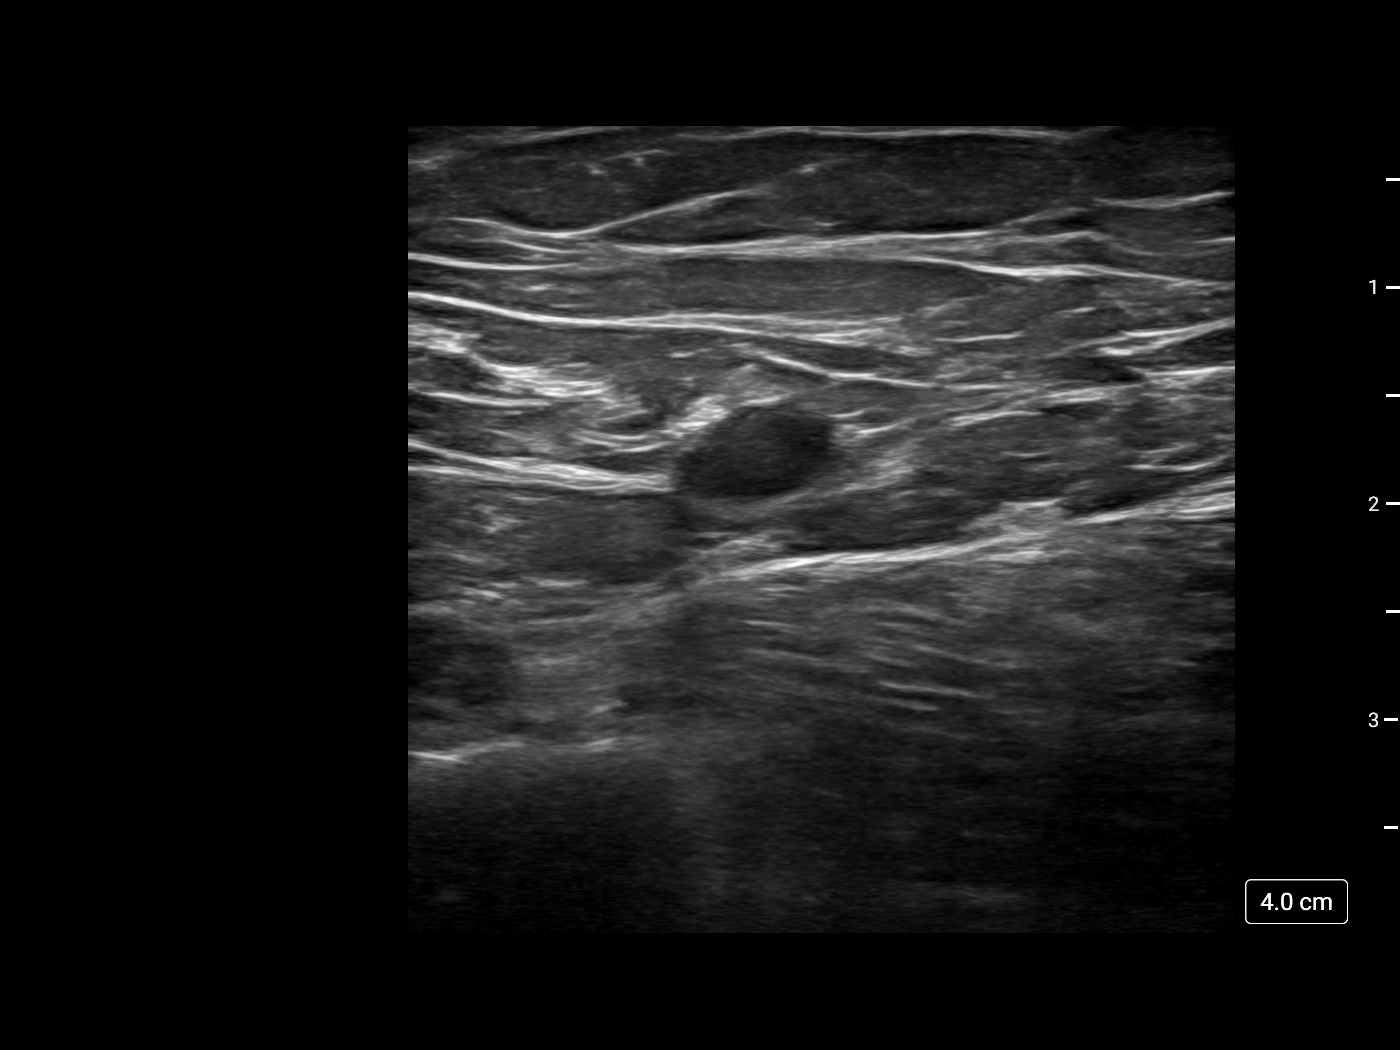
[im 2/2]
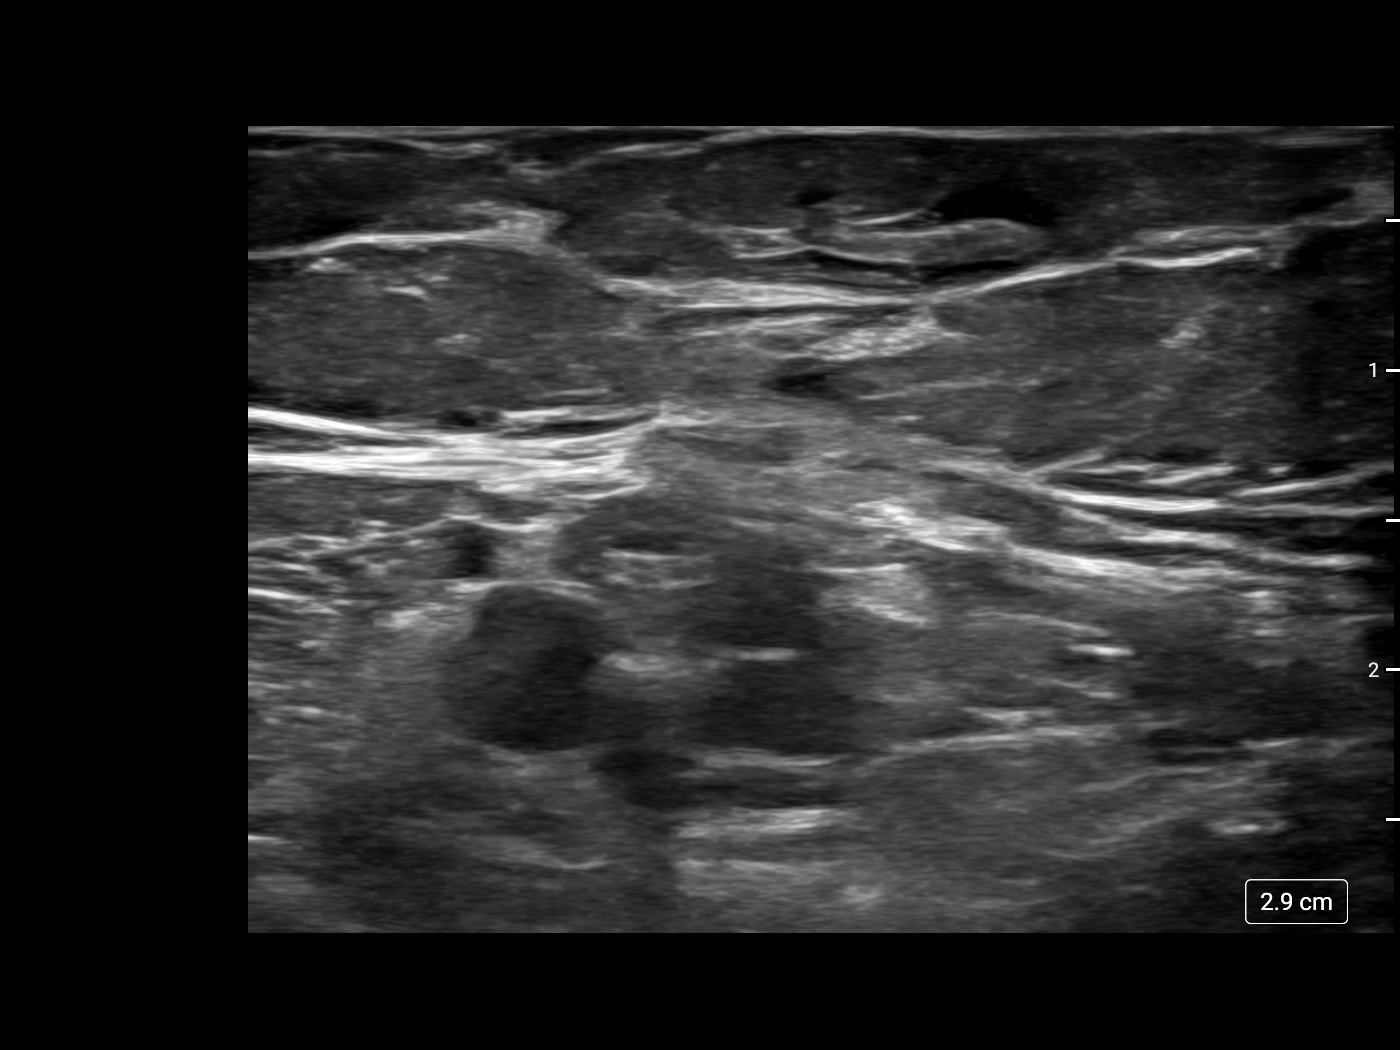

[2 of 2 positions shown; findings below may reference images not displayed]

The right upper extremity was prepped with chlorhexidine in a
sterile fashion, and a sterile drape was applied covering the
operative field. Maximum barrier sterile technique with sterile
gowns and gloves were used for the procedure. A timeout was
performed prior to the initiation of the procedure. Local anesthesia
was provided with 1% lidocaine.

Under direct ultrasound guidance, the brachial vein was accessed
with a micropuncture kit after the overlying soft tissues were
anesthetized with 1% lidocaine.

After the overlying soft tissues were anesthetized, a small venotomy
incision was created and a micropuncture kit was utilized to access
the right brachial vein. Real-time ultrasound guidance was utilized
for vascular access including the acquisition of a permanent
ultrasound image documenting patency of the accessed vessel.

A guidewire was advanced to the level of the superior caval-atrial
junction for measurement purposes and the PICC line was cut to
length. A peel-away sheath was placed and a 46 cm, 5 French, single
lumen was inserted to level of the superior caval-atrial junction. A
post procedure spot fluoroscopic was obtained. The catheter easily
aspirated and flushed and was sutured in place. A dressing was
placed. The patient tolerated the procedure well without immediate
post procedural complication.
FINDINGS: After catheter placement, the tip lies within the superior
cavoatrial junction. The catheter aspirates and flushes normally and
is ready for immediate use.
IMPRESSION: Successful ultrasound and fluoroscopic guided placement of a right
brachial vein approach, 46 cm, 5 French, single lumen PICC with tip
at the superior caval-atrial junction. The PICC line is ready for
immediate use.

## 2021-06-23 ENCOUNTER — Non-Acute Institutional Stay: Payer: Medicare Other | Admitting: Hospice

## 2021-06-23 DIAGNOSIS — J9611 Chronic respiratory failure with hypoxia: Secondary | ICD-10-CM

## 2021-06-23 DIAGNOSIS — I5032 Chronic diastolic (congestive) heart failure: Secondary | ICD-10-CM

## 2021-06-23 DIAGNOSIS — Z515 Encounter for palliative care: Secondary | ICD-10-CM

## 2021-06-23 NOTE — Progress Notes (Signed)
? ? ? ?[facility] ?Community Palliative Care Consult Note ?Telephone: [phone]  ?Fax: [phone] ? ?PATIENT NAME: [name] ?[address] ?[place] [address] ?[phone] (home)  ?DOB: 12-27-1950 ?MRN: [id number] ? ?PRIMARY CARE PROVIDER:    ?Bernerd Limbo, MD,  ?[address] ?[place] [address] ?[phone] ? ?REFERRING PROVIDER:   ?Martinique Miller NP ? ?RESPONSIBLE PARTY:   Self ?Contact Information   ? ? Name Relation Home Work Mobile  ? [name],[name] Sister [phone]  [phone]  ? Charolette Child Friend [phone]  [phone]  ? ?  ? ? ?Palliative Care was asked to follow this patient by consultation request of  Miller Martinique NP to address advance care planning, complex medical decision making and goals of care clarification. Patient endorsed palliative service.  ? ?  ASSESSMENT AND / RECOMMENDATIONS:  ? ?CODE STATUS: Patient is a  Full code. ? ?Goals of Care: Goals include to maximize quality of life and symptom management ? ?Symptom Management/Plan: ?Chronic respiratory failure with hypoxia: likely related to chronic diastolic CHF. Continue oxygen supplementation 2 L/min.  Strict fluid management. ?CHF: Hospitalization 3/6-3/30/24 for fluid overload and intermittent scrotal edema related to CHF.  Scrotal edema resolved.  Continue reduced salt and adhere to fluid limit of 1200 ml. Routine CBC BMP ?Right hip pain: Managed Tylenol, methocarbamol and gabapentin.  ?Morbid Obesity: Education provided on metabolic syndrome associated with morbid obesity.  Encouraged reduce sweetened drinks and snacks.  Patient interested in working with a nutritionist.  ?Recommendation: Patient will benefit from ST consult/dietary. ?Follow up: Palliative care will continue to follow for complex medical decision making, advance care planning, and clarification of goals. Return 6 weeks or prn. Encouraged to call provider sooner with any concerns.  ? ?HOSPICE  ELIGIBILITY/DIAGNOSIS: TBD ? ?Chief Complaint: Follow up  visit ? ?HISTORY OF PRESENT ILLNESS:  [name] is a 71 y.o. year old male  with multiple medical conditions including chronic diastolic heart failure, chronic respiratory failure with hypoxia, morbid obesity, hypothyroidism, weakness, A-fib, DVT.  History of COVID-19 viral infection, scrotal swelling. He is currently on PT/OT which is helpful. Patient denies pain/discomfort; was open to working with a nutritionist to help with his obesity. ?History obtained from review of EMR, discussion with primary team, caregiver, family and/or Mr. Noe.  ?Review and summarization of Epic records shows history from other than patient. Rest of 10 point ROS asked and negative.  ?I reviewed as needed, available labs, patient records, imaging, studies and related documents from the EMR. ? ?Physical Exam: ? ?Height/Weight: 5 feet 8 inches/380.5 Ib; 440 Ib  in Feb 2023 ?Constitutional: NAD ?General: Well groomed, cooperative ?EYES: anicteric sclera, lids intact, no discharge  ?ENMT: Moist mucous membrane ?CV: S1 S2, RRR, trace edema to right lower extremity ?Pulmonary: LCTA, no increased work of breathing, no cough, oxygen supplementation 2 L/min ?Abdomen: active BS + 4 quadrants, soft and non tender ?MSK: weakness, LAKA limited ROM ?Skin: warm and dry, no rashes or wounds on visible skin ?Neuro:  weakness, otherwise non focal, alert and oriented x 3 ?Psych: non-anxious affect ?Hem/lymph/immuno: no widespread bruising ? ? ?PAST MEDICAL HISTORY:  ?Active Ambulatory Problems  ?  Diagnosis Date Noted  ? STREPTOCOCCUS INFECTION CCE & UNS SITE GROUP C 07/04/2008  ? METHICILLIN RESISTANT STAPHYLOCOCCUS AUREUS INFECTION 09/10/2009  ? DM 07/04/2008  ? CHRONIC KIDNEY DISEASE UNSPECIFIED 07/04/2008  ? INFECTION DUE TO INTERNAL ORTH DEVICE NEC 03/02/2006  ? PULMONARY EMBOLISM, HX OF 07/04/2008  ?  Cellulitis 09/19/2010  ? Septic arthritis of knee ([place])   ? S/P left TK revision  06/23/2012  ? Super obesity 06/27/2012  ? Expected blood loss anemia 06/27/2012  ? SOB (shortness of breath) 08/26/2012  ? Chest discomfort 08/26/2012  ? Persistent atrial fibrillation ([place]) 08/26/2012  ? Chronic anticoagulation, with Xarelto 09/10/2012  ? DVT (deep venous thrombosis), possible 09/10/2012  ? OSA (obstructive sleep apnea) 11/27/2012  ? Preoperative cardiovascular examination 04/17/2013  ? 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  ? S/P rev left TK 12/15/2015  ? Administration of long-term prophylactic antibiotics   ? History of streptococcal infection   ? History of DVT (deep vein thrombosis)   ? S/P left TK revision 04/03/2019  ? Open knee wound 08/14/2019  ? 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[place]) 08/15/2019  ? Septic joint of left knee joint ([place]) 11/06/2019  ? 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 Cellulitis of left leg   ? Pneumonia due to COVID-19 virus 01/15/2021  ? Hypothyroidism   ? Macrocytic anemia   ? Morbid obesity with BMI of 60.0-69.9, adult ([place])   ? Acute respiratory failure with hypoxia ([place]) 01/19/2021  ? Cellulitis of groin, right 04/14/2021  ? Chronic respiratory failure with hypoxia ([place]) 04/14/2021  ? Pressure injury of skin 04/14/2021  ? Acute on chronic combined systolic and diastolic CHF (congestive heart failure) ([place]) 04/17/2021  ? Physical deconditioning 04/17/2021  ? ?Resolved Ambulatory Problems  ?  Diagnosis Date Noted  ? Acquired absence of knee joint following removal of joint prosthesis with presence of antibiotic-impregnated cement spacer 09/21/2018  ? Acquired absence of knee joint following explantation of joint prosthesis with presence of antibiotic-impregnated cement spacer 04/03/2019  ? ?Past Medical History:  ?Diagnosis Date  ? CHF (congestive heart failure) ([place]) 05/2019  ? Complication of anesthesia   ? Complication of anesthesia   ? Dysrhythmia   ? GERD (gastroesophageal reflux disease)    ? History of blood clots   ? History of blood transfusion   ? Pain   ? Shortness of breath   ? Sleep apnea   ? ? ?SOCIAL HX:  ?Social History  ? ?Tobacco Use  ? Smoking status: Never  ? Smokeless tobacco: Never  ?Substance Use Topics  ? Alcohol use: Not Currently  ?  Comment: not since 1999. However, had around 6 drinks a day between 1993-1999, roughly.  ? ?  ?FAMILY HX:  ?Family History  ?Problem Relation Age of Onset  ? Heart disease Father 62  ? Pancreatic cancer Sister   ? Liver cancer Sister   ? Cervical cancer Sister   ? Breast cancer Sister   ? Diabetes Sister   ? Colon cancer Neg Hx   ? Throat cancer Neg Hx   ? Stomach cancer Neg Hx   ? Kidney disease Neg Hx   ? Liver disease Neg Hx   ?   ? ?ALLERGIES:  ?Allergies  ?Allergen Reactions  ? Morphine Hives  ?   ? ?PERTINENT MEDICATIONS:  ?Outpatient Encounter Medications as of 06/23/2021  ?Medication Sig  ? acetaminophen (TYLENOL) 500 MG tablet Take 500 mg by mouth every 6 (six) hours as needed for mild pain or fever.  ? apixaban (ELIQUIS) 5 MG TABS tablet Take 5 mg by mouth 2 (two) times daily.  ? capsaicin (ZOSTRIX) 0.025 % cream Apply 1 application topically every 8 (eight) hours as needed (phantom limb pain on the  left thigh/leg).  ? clotrimazole (LOTRIMIN) 1 % cream Apply 1 application topically 2 (two) times daily.  ? digoxin (LANOXIN) 0.125 MG tablet Take 1 tablet (0.125 mg total) by mouth daily.  ? gabapentin (NEURONTIN) 800 MG tablet Take 800 mg by mouth 3 (three) times daily.  ? guaiFENesin-dextromethorphan (ROBITUSSIN DM) 100-10 MG/5ML syrup Take 10 mLs by mouth every 4 (four) hours as needed for cough. (Patient taking differently: Take 20 mLs by mouth every 4 (four) hours as needed for cough.)  ? levothyroxine (SYNTHROID) 125 MCG tablet Take 125 mcg by mouth daily before breakfast.   ? losartan (COZAAR) 25 MG tablet Take 1 tablet (25 mg total) by mouth daily.  ? Melatonin 10 MG CAPS Take 10 mg by mouth at bedtime.  ? methocarbamol (ROBAXIN) 500 MG  tablet Take 500 mg by mouth every 6 (six) hours as needed for muscle spasms.  ? metoprolol succinate (TOPROL-XL) 100 MG 24 hr tablet Take 1 tablet (100 mg total) by mouth daily. Take with or immediately followi

## 2021-09-01 ENCOUNTER — Non-Acute Institutional Stay: Payer: Medicare Other | Admitting: Hospice

## 2021-09-01 DIAGNOSIS — Z515 Encounter for palliative care: Secondary | ICD-10-CM

## 2021-09-01 DIAGNOSIS — J9611 Chronic respiratory failure with hypoxia: Secondary | ICD-10-CM

## 2021-09-01 DIAGNOSIS — I5032 Chronic diastolic (congestive) heart failure: Secondary | ICD-10-CM

## 2021-09-01 NOTE — Progress Notes (Signed)
[facility] Palliative Care Consult Note Telephone: [phone]  Fax: [phone]  PATIENT NAME: [name] [id number] [phone] (home)  DOB: 1950-04-06 MRN: [id number]  PRIMARY CARE PROVIDER:    Bernerd Limbo, MD,  [address] [place] [address] [phone]  REFERRING PROVIDER:   Martinique Miller NP  RESPONSIBLE PARTY:   Self Contact Information     Name Relation Home Work Mobile   [name] Sister [phone]  [phone]   [name] [phone]  [phone]       Palliative Care was asked to follow this patient by consultation request of  Miller Martinique NP to address advance care planning, complex medical decision making and goals of care clarification.     ASSESSMENT AND / RECOMMENDATIONS:   CODE STATUS: Patient is a  Full code.  Goals of Care: Goals include to maximize quality of life and symptom management  Symptom Management/Plan: CHF: Managed with Torsemide.  Continue reduced salt and adhere to fluid limit of 1200 ml. Routine CBC BMP Chronic respiratory failure with hypoxia: likely related to chronic diastolic CHF. Continue oxygen supplementation 2 L/min.  Strict fluid management. Right hip pain: Managed Tylenol, methocarbamol and gabapentin.  Morbid Obesity: Current weight 370 Ibs down from 405 Jan 2023 and 440 Ibs Dec 2022. Education provided on metabolic syndrome associated with morbid obesity.  Encouraged reduce sweetened drinks and snacks. Recommendation: Patient will benefit from dietary. Follow up: Palliative care will continue to follow for complex medical decision making, advance care planning, and clarification of goals. Return 6 weeks or prn. Encouraged to call provider sooner with any concerns.   Discharge plans in progress for discharge to home- [facility] in [place]. Patient is happy at the prospect of going home.   HOSPICE  ELIGIBILITY/DIAGNOSIS: TBD  Chief Complaint: Follow up  visit  HISTORY OF PRESENT ILLNESS:  [name] is a 71 y.o. year old male  with multiple medical conditions including chronic diastolic heart failure, chronic respiratory failure with hypoxia, morbid obesity, hypothyroidism, weakness, A-fib, DVT.  History of COVID-19 viral infection, scrotal swelling. History obtained from review of EMR, discussion with primary team, caregiver, family and/or [name].  Review and summarization of Epic records shows history from other than patient. Rest of 10 point ROS asked and negative.  I reviewed as needed, available labs, patient records, imaging, studies and related documents from the EMR.  PAST MEDICAL HISTORY:  Active Ambulatory Problems    Diagnosis Date Noted   STREPTOCOCCUS INFECTION CCE & UNS SITE GROUP C 07/04/2008   METHICILLIN RESISTANT STAPHYLOCOCCUS AUREUS INFECTION 09/10/2009   DM 07/04/2008   CHRONIC KIDNEY DISEASE UNSPECIFIED 07/04/2008   INFECTION DUE TO INTERNAL ORTH DEVICE NEC 03/02/2006   PULMONARY EMBOLISM, HX OF 07/04/2008   Cellulitis 09/19/2010   Septic arthritis of knee ([place])    S/P left TK revision 06/23/2012   Super obesity 06/27/2012   Expected blood loss anemia 06/27/2012   SOB (shortness of breath) 08/26/2012   Chest discomfort 08/26/2012   Persistent atrial fibrillation ([place]) 08/26/2012   Chronic anticoagulation, with Xarelto 09/10/2012   DVT (deep venous thrombosis), possible 09/10/2012   OSA (obstructive sleep apnea) 11/27/2012   Preoperative cardiovascular examination 04/17/2013   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   S/P rev left TK 12/15/2015   Administration of long-term prophylactic antibiotics    History of streptococcal infection    History of DVT (deep vein thrombosis)  S/P left TK revision 04/03/2019   Open knee wound 08/14/2019   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[place]) 08/15/2019   Septic joint of left knee joint ([place]) 11/06/2019    Infection of prosthetic left knee joint ([place]) 11/08/2019   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)    Cellulitis of left leg    Pneumonia due to COVID-19 virus 01/15/2021   Hypothyroidism    Macrocytic anemia    Morbid obesity with BMI of 60.0-69.9, adult ([place])    Acute respiratory failure with hypoxia ([place]) 01/19/2021   Cellulitis of groin, right 04/14/2021   Chronic respiratory failure with hypoxia ([place]) 04/14/2021   Pressure injury of skin 04/14/2021   Acute on chronic combined systolic and diastolic CHF (congestive heart failure) ([place]) 04/17/2021   Physical deconditioning 04/17/2021   Resolved Ambulatory Problems    Diagnosis Date Noted   Acquired absence of knee joint following removal of joint prosthesis with presence of antibiotic-impregnated cement spacer 09/21/2018   Acquired absence of knee joint following explantation of joint prosthesis with presence of antibiotic-impregnated cement spacer 04/03/2019   Past Medical History:  Diagnosis Date   CHF (congestive heart failure) ([place]) 12/5724   Complication of anesthesia    Complication of anesthesia    Dysrhythmia    GERD (gastroesophageal reflux disease)    History of blood clots    History of blood transfusion    Pain    Shortness of breath    Sleep apnea     SOCIAL HX:  Social History   Tobacco Use   Smoking status: Never   Smokeless tobacco: Never  Substance Use Topics   Alcohol use: Not Currently    Comment: not since 1999. However, had around 6 drinks a day between 1993-1999, roughly.     FAMILY HX:  Family History  Problem Relation Age of Onset   Heart disease Father 69   Pancreatic cancer Sister    Liver cancer Sister    Cervical cancer Sister    Breast cancer Sister    Diabetes Sister    Colon cancer Neg Hx    Throat cancer Neg Hx    Stomach cancer Neg Hx    Kidney disease Neg Hx    Liver disease Neg Hx       ALLERGIES:  Allergies  Allergen Reactions   Morphine Hives       PERTINENT MEDICATIONS:  Outpatient Encounter Medications as of 09/01/2021  Medication Sig   acetaminophen (TYLENOL) 500 MG tablet Take 500 mg by mouth every 6 (six) hours as needed for mild pain or fever.   apixaban (ELIQUIS) 5 MG TABS tablet Take 5 mg by mouth 2 (two) times daily.   capsaicin (ZOSTRIX) 0.025 % cream Apply 1 application topically every 8 (eight) hours as needed (phantom limb pain on the left thigh/leg).   clotrimazole (LOTRIMIN) 1 % cream Apply 1 application topically 2 (two) times daily.   digoxin (LANOXIN) 0.125 MG tablet Take 1 tablet (0.125 mg total) by mouth daily.   gabapentin (NEURONTIN) 800 MG tablet Take 800 mg by mouth 3 (three) times daily.   guaiFENesin-dextromethorphan (ROBITUSSIN DM) 100-10 MG/5ML syrup Take 10 mLs by mouth every 4 (four) hours as needed for cough. (Patient taking differently: Take 20 mLs by mouth every 4 (four) hours as needed for cough.)   levothyroxine (SYNTHROID) 125 MCG tablet Take 125 mcg by mouth daily before breakfast.    losartan (COZAAR) 25 MG tablet Take 1 tablet (25 mg total) by  mouth daily.   Melatonin 10 MG CAPS Take 10 mg by mouth at bedtime.   methocarbamol (ROBAXIN) 500 MG tablet Take 500 mg by mouth every 6 (six) hours as needed for muscle spasms.   metoprolol succinate (TOPROL-XL) 100 MG 24 hr tablet Take 1 tablet (100 mg total) by mouth daily. Take with or immediately following a meal.   Multiple Vitamin (MULTIVITAMIN WITH MINERALS) TABS Take 1 tablet by mouth daily.   naphazoline-glycerin (CLEAR EYES REDNESS) 0.012-0.2 % SOLN Place 1-2 drops into both eyes 4 (four) times daily as needed for eye irritation. (Patient not taking: Reported on 04/13/2021)   nutrition supplement, JUVEN, (JUVEN) PACK Take 1 packet by mouth 2 (two) times daily with a meal.   Nutritional Supplements (,FEEDING SUPPLEMENT, PROSOURCE PLUS) liquid Take 30 mLs by mouth 3 (three) times daily between meals.   potassium chloride (KLOR-CON M) 10 MEQ tablet Take  10 mEq by mouth daily.   torsemide (DEMADEX) 20 MG tablet Take 1 tablet (20 mg total) by mouth daily.   vitamin B-12 (CYANOCOBALAMIN) 1000 MCG tablet Take 1,000 mcg by mouth daily.   vitamin C (ASCORBIC ACID) 500 MG tablet Take 500 mg by mouth daily.   zinc gluconate 50 MG tablet Take 50 mg by mouth daily.   No facility-administered encounter medications on file as of 09/01/2021.   I spent 45 minutes providing this consultation; this includes time spent with patient/family, chart review and documentation. More than 50% of the time in this consultation was spent on counseling and coordinating communication   Thank you for the opportunity to participate in the care of Mr. Delbene.  The palliative care team will continue to follow. Please call our office at [phone] if we can be of additional assistance.   Note: Portions of this note were generated with [employer]. Dictation errors may occur despite best attempts at proofreading.  Teodoro Spray, NP

## 2021-09-07 ENCOUNTER — Other Ambulatory Visit: Payer: Self-pay

## 2021-09-07 ENCOUNTER — Emergency Department (HOSPITAL_COMMUNITY): Payer: Medicare Other

## 2021-09-07 ENCOUNTER — Encounter (HOSPITAL_COMMUNITY): Payer: Self-pay

## 2021-09-07 ENCOUNTER — Emergency Department (HOSPITAL_COMMUNITY)
Admission: EM | Admit: 2021-09-07 | Discharge: 2021-09-08 | Disposition: A | Discharge status: Home / Self Care | Payer: Medicare Other | Attending: Emergency Medicine | Admitting: Emergency Medicine

## 2021-09-07 DIAGNOSIS — Z736 Limitation of activities due to disability: Secondary | ICD-10-CM | POA: Diagnosis not present

## 2021-09-07 DIAGNOSIS — R109 Unspecified abdominal pain: Secondary | ICD-10-CM | POA: Diagnosis not present

## 2021-09-07 DIAGNOSIS — Z7901 Long term (current) use of anticoagulants: Secondary | ICD-10-CM | POA: Diagnosis not present

## 2021-09-07 DIAGNOSIS — E039 Hypothyroidism, unspecified: Secondary | ICD-10-CM | POA: Insufficient documentation

## 2021-09-07 DIAGNOSIS — R21 Rash and other nonspecific skin eruption: Secondary | ICD-10-CM | POA: Insufficient documentation

## 2021-09-07 DIAGNOSIS — Z760 Encounter for issue of repeat prescription: Secondary | ICD-10-CM | POA: Diagnosis not present

## 2021-09-07 DIAGNOSIS — Z8616 Personal history of COVID-19: Secondary | ICD-10-CM | POA: Diagnosis not present

## 2021-09-07 DIAGNOSIS — L03213 Periorbital cellulitis: Secondary | ICD-10-CM | POA: Diagnosis not present

## 2021-09-07 DIAGNOSIS — H6091 Unspecified otitis externa, right ear: Secondary | ICD-10-CM | POA: Insufficient documentation

## 2021-09-07 DIAGNOSIS — Z20822 Contact with and (suspected) exposure to covid-19: Secondary | ICD-10-CM | POA: Diagnosis not present

## 2021-09-07 DIAGNOSIS — R0602 Shortness of breath: Secondary | ICD-10-CM | POA: Diagnosis not present

## 2021-09-07 DIAGNOSIS — I509 Heart failure, unspecified: Secondary | ICD-10-CM | POA: Insufficient documentation

## 2021-09-07 DIAGNOSIS — I4891 Unspecified atrial fibrillation: Secondary | ICD-10-CM | POA: Insufficient documentation

## 2021-09-07 DIAGNOSIS — H9201 Otalgia, right ear: Secondary | ICD-10-CM | POA: Diagnosis present

## 2021-09-07 DIAGNOSIS — Z789 Other specified health status: Secondary | ICD-10-CM

## 2021-09-07 DIAGNOSIS — H60501 Unspecified acute noninfective otitis externa, right ear: Secondary | ICD-10-CM

## 2021-09-07 LAB — RESP PANEL BY RT-PCR (FLU A&B, COVID) ARPGX2
Influenza A by PCR: NEGATIVE
Influenza B by PCR: NEGATIVE
SARS Coronavirus 2 by RT PCR: NEGATIVE

## 2021-09-07 LAB — BASIC METABOLIC PANEL
Anion gap: 8 (ref 5–15)
BUN: 13 mg/dL (ref 8–23)
CO2: 27 mmol/L (ref 22–32)
Calcium: 9.3 mg/dL (ref 8.9–10.3)
Chloride: 105 mmol/L (ref 98–111)
Creatinine, Ser: 1.03 mg/dL (ref 0.61–1.24)
GFR, Estimated: >60 mL/min (ref >60)
Glucose, Bld: 99 mg/dL (ref 70–99)
Potassium: 3 mmol/L — ABNORMAL LOW (ref 3.5–5.1)
Sodium: 140 mmol/L (ref 135–145)

## 2021-09-07 LAB — HEPATIC FUNCTION PANEL
ALT: 19 U/L (ref 0–44)
AST: 22 U/L (ref 15–41)
Albumin: 2.9 g/dL — ABNORMAL LOW (ref 3.5–5.0)
Alkaline Phosphatase: 52 U/L (ref 38–126)
Bilirubin, Direct: 0.1 mg/dL (ref 0.0–0.2)
Indirect Bilirubin: 0.8 mg/dL (ref 0.3–0.9)
Total Bilirubin: 0.9 mg/dL (ref 0.3–1.2)
Total Protein: 6.7 g/dL (ref 6.5–8.1)

## 2021-09-07 LAB — TROPONIN I (HIGH SENSITIVITY)
Troponin I (High Sensitivity): 11 ng/L (ref <18)
Troponin I (High Sensitivity): 12 ng/L (ref <18)

## 2021-09-07 LAB — TSH: TSH: 7.049 u[IU]/mL — ABNORMAL HIGH (ref 0.350–4.500)

## 2021-09-07 LAB — LIPASE, BLOOD: Lipase: 28 U/L (ref 11–51)

## 2021-09-07 LAB — BRAIN NATRIURETIC PEPTIDE: B Natriuretic Peptide: 52 pg/mL (ref 0.0–100.0)

## 2021-09-07 LAB — BLOOD GAS, VENOUS
Acid-Base Excess: 3.5 mmol/L — ABNORMAL HIGH (ref 0.0–2.0)
Bicarbonate: 29.1 mmol/L — ABNORMAL HIGH (ref 20.0–28.0)
O2 Saturation: 49.8 %
Patient temperature: 37
pCO2, Ven: 47 mmHg (ref 44–60)
pH, Ven: 7.4 (ref 7.25–7.43)
pO2, Ven: 31 mmHg — CL (ref 32–45)

## 2021-09-07 MED ORDER — LOSARTAN POTASSIUM 25 MG PO TABS: 25.0000 mg | ORAL_TABLET | Freq: Every day | ORAL | 0 refills | Status: DC

## 2021-09-07 MED ORDER — METOPROLOL SUCCINATE ER 100 MG PO TB24: 100.0000 mg | ORAL_TABLET | Freq: Every day | ORAL | 0 refills | Status: DC

## 2021-09-07 MED ORDER — CIPROFLOXACIN-DEXAMETHASONE 0.3-0.1 % OT SUSP
4.0000 [drp] | Freq: Two times a day (BID) | OTIC | Status: DC
  Administered 2021-09-07: 4 [drp] via OTIC
  Filled 2021-09-07: qty 7.5

## 2021-09-07 MED ORDER — SODIUM CHLORIDE (PF) 0.9 % IJ SOLN
INTRAMUSCULAR | Status: AC
  Filled 2021-09-07: qty 50

## 2021-09-07 MED ORDER — METOPROLOL SUCCINATE ER 50 MG PO TB24
50.0000 mg | ORAL_TABLET | Freq: Once | ORAL | Status: AC
Start: 2021-09-07 — End: 2021-09-07
  Administered 2021-09-07: 50 mg via ORAL
  Filled 2021-09-07: qty 1

## 2021-09-07 MED ORDER — DESITIN 13 % EX CREA: 1.0000 | TOPICAL_CREAM | Freq: Two times a day (BID) | CUTANEOUS | 0 refills | Status: AC | PRN

## 2021-09-07 MED ORDER — GABAPENTIN 800 MG PO TABS: 800.0000 mg | ORAL_TABLET | Freq: Three times a day (TID) | ORAL | 0 refills | Status: DC

## 2021-09-07 MED ORDER — LEVOTHYROXINE SODIUM 125 MCG PO TABS: 125.0000 ug | ORAL_TABLET | Freq: Every day | ORAL | 0 refills | Status: DC

## 2021-09-07 MED ORDER — POTASSIUM CHLORIDE CRYS ER 20 MEQ PO TBCR
40.0000 meq | EXTENDED_RELEASE_TABLET | Freq: Once | ORAL | Status: AC
  Administered 2021-09-07: 40 meq via ORAL
  Filled 2021-09-07: qty 2

## 2021-09-07 MED ORDER — TORSEMIDE 20 MG PO TABS: 20.0000 mg | ORAL_TABLET | Freq: Every day | ORAL | 0 refills | Status: DC

## 2021-09-07 MED ORDER — IOHEXOL 350 MG/ML SOLN
100.0000 mL | Freq: Once | INTRAVENOUS | Status: AC | PRN
  Administered 2021-09-07: 100 mL via INTRAVENOUS

## 2021-09-07 MED ORDER — TORSEMIDE 20 MG PO TABS
20.0000 mg | ORAL_TABLET | Freq: Once | ORAL | Status: AC
  Administered 2021-09-07: 20 mg via ORAL
  Filled 2021-09-07: qty 1

## 2021-09-07 MED ORDER — APIXABAN 5 MG PO TABS
5.0000 mg | ORAL_TABLET | Freq: Once | ORAL | Status: AC
Start: 2021-09-07 — End: 2021-09-07
  Administered 2021-09-07: 5 mg via ORAL
  Filled 2021-09-07: qty 1

## 2021-09-07 MED ORDER — DIGOXIN 125 MCG PO TABS: 0.1250 mg | ORAL_TABLET | Freq: Every day | ORAL | 0 refills | Status: DC

## 2021-09-07 MED ORDER — SULFAMETHOXAZOLE-TRIMETHOPRIM 800-160 MG PO TABS: 1.0000 | ORAL_TABLET | Freq: Two times a day (BID) | ORAL | 0 refills | Status: DC

## 2021-09-07 MED ORDER — APIXABAN 5 MG PO TABS: 5.0000 mg | ORAL_TABLET | Freq: Two times a day (BID) | ORAL | 0 refills | Status: DC

## 2021-09-07 MED ORDER — CEPHALEXIN 500 MG PO CAPS: 500.0000 mg | ORAL_CAPSULE | Freq: Three times a day (TID) | ORAL | 0 refills | Status: DC

## 2021-09-07 NOTE — ED Notes (Signed)
Pt's sister requests a call if needed. Evans Lance [phone]

## 2021-09-07 NOTE — ED Triage Notes (Signed)
EMS reports from home, recent stay at rehab for CHF. Has been home for 3 days with no medications. Arrives today SOB.  BP 104/58 HR 112 RR 26 Sp02 98 @ 4lts CBG 119

## 2021-09-07 NOTE — ED Notes (Signed)
Pt was able to eat a sandwich and drink decaf coffee without difficulty. RN repositioned pt to right side for comfort and supported him with pillows. No additional needs identified.

## 2021-09-07 NOTE — ED Provider Notes (Signed)
[facility] DEPT Provider Note   CSN: [id number] Arrival date & time: 09/07/21  1457     History {Add pertinent medical, surgical, social history, OB history to HPI:1} Chief Complaint  Patient presents with   Shortness of Breath    [name] is a 71 y.o. male.  Patient as above with significant medical history as below, including CHF on lasix, afib on xarelto, OSA on CPAPwho presents to the ED with complaint of  a myriad of complaints.  Patient reports he is recently transition to different housing facility.  He had 3 days of diarrhea which is since subsided.  No BRBPR or melena.  No significant abdominal pain.  Does report dyspnea with minimal exertion.  Does not typically wear home oxygen, does use CPAP at nighttime.  He became very short of breath with moving in the bed, or rolling over in the bed.  He has not taken his medications including Lasix for the past 3 days because he cannot find them.  He is unsure if he is taking Xarelto or not due to difficulty finding his medications.  Patient also reports is right ear has been hurting for the past 3 days.  No difficulty swallowing or moving his jaw.  Feels his hearing is muffled on the right side.  His face is also more red than normal.  No chest pain.  He feels symptoms are associated with prior COVID-19 infection from 7 months ago.     Past Medical History:  Diagnosis Date   CHF (congestive heart failure) ([place]) 05/2019   Chronic anticoagulation    with Xarelto   Complication of anesthesia    PT STATES HARD TO WAKE UP AFTER ONE SUGERY -STATES THE SURGERY TOOK LONGER THAN EXPECTED.  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   Shortness of breath    WITH EXERTION AND PAIN   Sleep apnea    uses CPAP,    Past Surgical History:  Procedure Laterality Date   2010 REMOVAL OF LEFT TOTAL KNEE     AMPUTATION Left 11/17/2019   Procedure: AMPUTATION ABOVE KNEE;  Surgeon: Newt Minion, MD;  Location: [facility];  Service: Orthopedics;  Laterality: Left;   CARDIAC CATHETERIZATION  2006   CARDIOVERSION N/A 08/28/2012   Procedure: CARDIOVERSION;  Surgeon: Sanda Klein, MD;  Location: [facility];  Service: Cardiovascular;  Laterality: N/A;   CARDIOVERSION N/A 09/01/2012   Procedure: CARDIOVERSION;  Surgeon: Pixie Casino, MD;  Location: [facility];  Service: Cardiovascular;  Laterality: N/A;   COLONOSCOPY N/A 06/13/2013   Procedure: COLONOSCOPY;  Surgeon: Inda Castle, MD;  Location: [facility];  Service: Endoscopy;  Laterality: N/A;   EXCISIONAL TOTAL KNEE ARTHROPLASTY WITH ANTIBIOTIC SPACERS Left 09/21/2018   Procedure: Resection of tibia versus both components with placement of antibiotic spacer;  Surgeon: Paralee Cancel, MD;  Location: [facility];  Service: Orthopedics;  Laterality: Left;  2.5 hrs   EXCISIONAL TOTAL KNEE ARTHROPLASTY  WITH ANTIBIOTIC SPACERS Left 01/02/2019   Procedure: Repeat Washout and placement of antibiotic spacer left knee;  Surgeon: Paralee Cancel, MD;  Location: [facility];  Service: Orthopedics;  Laterality: Left;  2 hrs   HYDROCELECTOY   2012   I & D EXTREMITY Left 08/14/2019   Procedure: IRRIGATION AND DEBRIDEMENT LEFT KNEE WOUND;  Surgeon: Paralee Cancel, MD;  Location: [facility];  Service: Orthopedics;  Laterality: Left;  60 mins   I & D KNEE WITH POLY EXCHANGE Left 05/17/2019   Procedure: IRRIGATION AND DEBRIDEMENT KNEE WITH POLY EXCHANGE;  Surgeon: Paralee Cancel, MD;  Location: [facility];  Service: Orthopedics;  Laterality: Left;  90 mins   IRRIGATION AND DEBRIDEMENT KNEE Left 04/03/2019    Procedure: REIMPLANTATION OF LEFT KNEE TIBIAL COMPONENT.;  Surgeon: Paralee Cancel, MD;  Location: [facility];  Service: Orthopedics;  Laterality: Left;  need 120 min   IRRIGATION AND DEBRIDEMENT KNEE Left 11/06/2019   Procedure: IRRIGATION AND DEBRIDEMENT KNEE;  Surgeon: Paralee Cancel, MD;  Location: [facility];  Service: Orthopedics;  Laterality: Left;  90 mins   LEFT KNEE ARTHROSCOPY  1999   LEFT KNEE SURGERY UPPER TIBIAL OSTOMY     LEFT TOTAL KNEE REMOVAL FOR INFECTION  2006   REIMPLANTATION LEFT TOTAL KNEE  2008   REIMPLANTATION LEFT TOTAL KNEE   2006   REIMPLANTATION OF TOTAL KNEE Left 06/23/2012   Procedure: REIMPLANTATION OF LEFT TOTAL KNEE;  Surgeon: Mauri Pole, MD;  Location: [facility];  Service: Orthopedics;  Laterality: Left;   REMOVAL LEFT TOTAL KNEE   2008   REPLACEMENT LEFT KNEE  2002   REPLACEMENT RIGHT KNEE  2003   REVISION LEFT KNEE CAP   2004   RIGHT KNEE ARTHROSCOPY  1998   TEE WITHOUT CARDIOVERSION N/A 08/28/2012   Procedure: TRANSESOPHAGEAL ECHOCARDIOGRAM (TEE);  Surgeon: Sanda Klein, MD;  Location: [facility];  Service: Cardiovascular;  Laterality: N/A;   TONSILLECTOMY     TOTAL KNEE REVISION Left 12/15/2015   Procedure: TOTAL KNEE REVISION REPLACEMENT;  Surgeon: Paralee Cancel, MD;  Location: [facility];  Service: Orthopedics;  Laterality: Left;  Adductor Block     The history is provided by the patient. No language interpreter was used.  Shortness of Breath Associated symptoms: ear pain and rash   Associated symptoms: no abdominal pain, no chest pain, no cough, no fever, no headaches and no vomiting        Home Medications Prior to Admission medications   Medication Sig Start Date End Date Taking? Authorizing Provider  acetaminophen (TYLENOL) 500 MG tablet Take 500 mg by mouth every 6 (six) hours as needed for mild pain or fever.    [provider]  apixaban (ELIQUIS) 5 MG TABS tablet Take 5 mg by mouth 2 (two) times daily.    [provider]  capsaicin  (ZOSTRIX) 0.025 % cream Apply 1 application topically every 8 (eight) hours as needed (phantom limb pain on the left thigh/leg).    [provider]  clotrimazole (LOTRIMIN) 1 % cream Apply 1 application topically 2 (two) times daily. 03/12/21   [provider]  digoxin (LANOXIN) 0.125 MG tablet Take 1 tablet (0.125 mg total) by mouth daily. 04/29/21   Antonieta Pert, MD  gabapentin (NEURONTIN) 800 MG tablet Take 800 mg by mouth 3 (three) times daily. 07/21/20   [provider]  guaiFENesin-dextromethorphan (ROBITUSSIN DM) 100-10 MG/5ML syrup Take 10 mLs by mouth every 4 (four) hours as needed for cough. Patient taking differently: Take  20 mLs by mouth every 4 (four) hours as needed for cough. 01/20/21   Shelly Coss, MD  levothyroxine (SYNTHROID) 125 MCG tablet Take 125 mcg by mouth daily before breakfast.  10/31/15   [provider]  losartan (COZAAR) 25 MG tablet Take 1 tablet (25 mg total) by mouth daily. 04/29/21   Antonieta Pert, MD  Melatonin 10 MG CAPS Take 10 mg by mouth at bedtime.    [provider]  methocarbamol (ROBAXIN) 500 MG tablet Take 500 mg by mouth every 6 (six) hours as needed for muscle spasms. 12/10/20   [provider]  metoprolol succinate (TOPROL-XL) 100 MG 24 hr tablet Take 1 tablet (100 mg total) by mouth daily. Take with or immediately following a meal. 04/29/21   Antonieta Pert, MD  Multiple Vitamin (MULTIVITAMIN WITH MINERALS) TABS Take 1 tablet by mouth daily.    [provider]  naphazoline-glycerin (CLEAR EYES REDNESS) 0.012-0.2 % SOLN Place 1-2 drops into both eyes 4 (four) times daily as needed for eye irritation. Patient not taking: Reported on 04/13/2021 11/21/19   Persons, Bevely Palmer, PA  nutrition supplement, JUVEN, (JUVEN) PACK Take 1 packet by mouth 2 (two) times daily with a meal. 04/29/21   Antonieta Pert, MD  Nutritional Supplements (,FEEDING SUPPLEMENT, PROSOURCE PLUS) liquid Take 30 mLs by mouth 3 (three) times daily  between meals. 04/29/21   Antonieta Pert, MD  potassium chloride (KLOR-CON M) 10 MEQ tablet Take 10 mEq by mouth daily. 01/06/21   [provider]  torsemide (DEMADEX) 20 MG tablet Take 1 tablet (20 mg total) by mouth daily. 04/29/21   Antonieta Pert, MD  vitamin B-12 (CYANOCOBALAMIN) 1000 MCG tablet Take 1,000 mcg by mouth daily.    [provider]  vitamin C (ASCORBIC ACID) 500 MG tablet Take 500 mg by mouth daily.    [provider]  zinc gluconate 50 MG tablet Take 50 mg by mouth daily.    [provider]      Allergies    Morphine    Review of Systems   Review of Systems  Constitutional:  Negative for chills and fever.  HENT:  Positive for congestion, ear pain and facial swelling. Negative for trouble swallowing.   Eyes:  Negative for photophobia and visual disturbance.  Respiratory:  Positive for shortness of breath. Negative for cough.   Cardiovascular:  Negative for chest pain and palpitations.  Gastrointestinal:  Positive for diarrhea. Negative for abdominal pain, nausea and vomiting.  Endocrine: Negative for polydipsia and polyuria.  Genitourinary:  Negative for difficulty urinating and hematuria.  Musculoskeletal:  Negative for gait problem and joint swelling.  Skin:  Positive for rash. Negative for pallor.  Neurological:  Negative for syncope and headaches.  Psychiatric/Behavioral:  Negative for agitation and confusion.     Physical Exam Updated Vital Signs BP 129/73   Pulse 86   Resp 20   SpO2 99%  Physical Exam  ED Results / Procedures / Treatments   Labs (all labs ordered are listed, but only abnormal results are displayed) Labs Reviewed  RESP PANEL BY RT-PCR (FLU A&B, COVID) ARPGX2  BASIC METABOLIC PANEL  BRAIN NATRIURETIC PEPTIDE  TSH  TROPONIN I (HIGH SENSITIVITY)    EKG None  Radiology No results found.  Procedures Procedures  {Document cardiac monitor, telemetry assessment procedure when appropriate:1}  Medications  Ordered in ED Medications - No data to display  ED Course/ Medical Decision Making/ A&P  Medical Decision Making Amount and/or Complexity of Data Reviewed Labs: ordered. Radiology: ordered.    CC: ***  This patient presents to the Emergency Department for the above complaint. This involves an extensive number of treatment options and is a complaint that carries with it a high risk of complications and morbidity. Vital signs were reviewed. Serious etiologies considered.  Record review:  Previous records obtained and reviewed ***  Additional history obtained from ***  Medical and surgical history as noted above.   Work up as above, notable for:  Labs & imaging results that were available during my care of the patient were visualized by me and considered in my medical decision making.  Physical exam as above.   I ordered imaging studies which included ***. I visualized the imaging, interpreted images, and I agree with radiologist interpretation. ***  Cardiac monitoring reviewed and interpreted personally which shows ***   Personally discussed patient care with consultant; ***  Management: ***  ED Course:     Reassessment:  ***  Admission was considered.                Social determinants of health include -  Social History   Socioeconomic History   Marital status: Single    Spouse name: Not on file   Number of children: Not on file   Years of education: Not on file   Highest education level: Not on file  Occupational History   Not on file  Tobacco Use   Smoking status: Never   Smokeless tobacco: Never  Vaping Use   Vaping Use: Never used  Substance and Sexual Activity   Alcohol use: Not Currently    Comment: not since 1999. However, had around 6 drinks a day between 1993-1999, roughly.   Drug use: No   Sexual activity: Yes    Partners: Female  Other Topics Concern   Not on file  Social History Narrative   Not  on file   Social Determinants of Health   Financial Resource Strain: Not on file  Food Insecurity: Not on file  Transportation Needs: Not on file  Physical Activity: Not on file  Stress: Not on file  Social Connections: Not on file  Intimate Partner Violence: Not on file      This chart was dictated using voice recognition software.  Despite best efforts to proofread,  errors can occur which can change the documentation meaning.   {Document critical care time when appropriate:1} {Document review of labs and clinical decision tools ie heart score, Chads2Vasc2 etc:1}  {Document your independent review of radiology images, and any outside records:1} {Document your discussion with family members, caretakers, and with consultants:1} {Document social determinants of health affecting pt's care:1} {Document your decision making why or why not admission, treatments were needed:1} Final Clinical Impression(s) / ED Diagnoses Final diagnoses:  None    Rx / DC Orders ED Discharge Orders     None

## 2021-09-07 NOTE — Discharge Instructions (Addendum)
It was a pleasure caring for you today in the emergency department.  Please return to the emergency department for any worsening or worrisome symptoms.  Please follow up with eye doctor  Please expect a phone call from social work to help assist with home health care  Please follow up with your pcp, return to ED if symptoms worsen.

## 2021-09-07 NOTE — ED Notes (Signed)
DC supplemental oxygen to trial room air. Will continue to monitor.

## 2021-09-07 NOTE — ED Notes (Signed)
Notified provider and primary RN of pt's Critical blood gas result: PO2 31

## 2021-09-14 ENCOUNTER — Inpatient Hospital Stay (HOSPITAL_COMMUNITY)
Admission: EM | Admit: 2021-09-14 | Discharge: 2021-09-22 | DRG: 565 | Disposition: A | Discharge status: Home Health | Payer: Medicare Other | Attending: Internal Medicine | Admitting: Internal Medicine

## 2021-09-14 ENCOUNTER — Emergency Department (HOSPITAL_COMMUNITY): Payer: Medicare Other

## 2021-09-14 ENCOUNTER — Other Ambulatory Visit: Payer: Self-pay

## 2021-09-14 ENCOUNTER — Encounter (HOSPITAL_COMMUNITY): Payer: Self-pay

## 2021-09-14 DIAGNOSIS — I110 Hypertensive heart disease with heart failure: Secondary | ICD-10-CM | POA: Diagnosis present

## 2021-09-14 DIAGNOSIS — Z79890 Hormone replacement therapy (postmenopausal): Secondary | ICD-10-CM

## 2021-09-14 DIAGNOSIS — T8744 Infection of amputation stump, left lower extremity: Principal | ICD-10-CM | POA: Diagnosis present

## 2021-09-14 DIAGNOSIS — Z86718 Personal history of other venous thrombosis and embolism: Secondary | ICD-10-CM

## 2021-09-14 DIAGNOSIS — I5022 Chronic systolic (congestive) heart failure: Secondary | ICD-10-CM

## 2021-09-14 DIAGNOSIS — I5042 Chronic combined systolic (congestive) and diastolic (congestive) heart failure: Secondary | ICD-10-CM | POA: Diagnosis present

## 2021-09-14 DIAGNOSIS — Z8249 Family history of ischemic heart disease and other diseases of the circulatory system: Secondary | ICD-10-CM

## 2021-09-14 DIAGNOSIS — R000 Tachycardia, unspecified: Secondary | ICD-10-CM | POA: Diagnosis present

## 2021-09-14 DIAGNOSIS — Z800 Family history of malignant neoplasm of digestive organs: Secondary | ICD-10-CM

## 2021-09-14 DIAGNOSIS — R5381 Other malaise: Secondary | ICD-10-CM

## 2021-09-14 DIAGNOSIS — L03314 Cellulitis of groin: Secondary | ICD-10-CM

## 2021-09-14 DIAGNOSIS — S0990XA Unspecified injury of head, initial encounter: Secondary | ICD-10-CM

## 2021-09-14 DIAGNOSIS — Y92009 Unspecified place in unspecified non-institutional (private) residence as the place of occurrence of the external cause: Secondary | ICD-10-CM

## 2021-09-14 DIAGNOSIS — B9562 Methicillin resistant Staphylococcus aureus infection as the cause of diseases classified elsewhere: Secondary | ICD-10-CM | POA: Diagnosis present

## 2021-09-14 DIAGNOSIS — L03116 Cellulitis of left lower limb: Secondary | ICD-10-CM | POA: Diagnosis present

## 2021-09-14 DIAGNOSIS — G4733 Obstructive sleep apnea (adult) (pediatric): Secondary | ICD-10-CM | POA: Diagnosis present

## 2021-09-14 DIAGNOSIS — Z89612 Acquired absence of left leg above knee: Secondary | ICD-10-CM

## 2021-09-14 DIAGNOSIS — E118 Type 2 diabetes mellitus with unspecified complications: Secondary | ICD-10-CM | POA: Diagnosis present

## 2021-09-14 DIAGNOSIS — Z8049 Family history of malignant neoplasm of other genital organs: Secondary | ICD-10-CM

## 2021-09-14 DIAGNOSIS — Z96652 Presence of left artificial knee joint: Secondary | ICD-10-CM

## 2021-09-14 DIAGNOSIS — W010XXA Fall on same level from slipping, tripping and stumbling without subsequent striking against object, initial encounter: Secondary | ICD-10-CM | POA: Diagnosis present

## 2021-09-14 DIAGNOSIS — W19XXXA Unspecified fall, initial encounter: Secondary | ICD-10-CM

## 2021-09-14 DIAGNOSIS — Z833 Family history of diabetes mellitus: Secondary | ICD-10-CM

## 2021-09-14 DIAGNOSIS — Z79899 Other long term (current) drug therapy: Secondary | ICD-10-CM

## 2021-09-14 DIAGNOSIS — Y835 Amputation of limb(s) as the cause of abnormal reaction of the patient, or of later complication, without mention of misadventure at the time of the procedure: Secondary | ICD-10-CM | POA: Diagnosis present

## 2021-09-14 DIAGNOSIS — Z803 Family history of malignant neoplasm of breast: Secondary | ICD-10-CM

## 2021-09-14 DIAGNOSIS — L0390 Cellulitis, unspecified: Secondary | ICD-10-CM | POA: Diagnosis present

## 2021-09-14 DIAGNOSIS — S78112A Complete traumatic amputation at level between left hip and knee, initial encounter: Secondary | ICD-10-CM

## 2021-09-14 DIAGNOSIS — I951 Orthostatic hypotension: Secondary | ICD-10-CM | POA: Diagnosis present

## 2021-09-14 DIAGNOSIS — T8740 Infection of amputation stump, unspecified extremity: Secondary | ICD-10-CM

## 2021-09-14 DIAGNOSIS — I4819 Other persistent atrial fibrillation: Secondary | ICD-10-CM | POA: Diagnosis present

## 2021-09-14 DIAGNOSIS — D539 Nutritional anemia, unspecified: Secondary | ICD-10-CM | POA: Diagnosis present

## 2021-09-14 DIAGNOSIS — E876 Hypokalemia: Secondary | ICD-10-CM

## 2021-09-14 DIAGNOSIS — Z885 Allergy status to narcotic agent status: Secondary | ICD-10-CM

## 2021-09-14 DIAGNOSIS — M25512 Pain in left shoulder: Secondary | ICD-10-CM | POA: Diagnosis present

## 2021-09-14 DIAGNOSIS — J9611 Chronic respiratory failure with hypoxia: Secondary | ICD-10-CM | POA: Diagnosis present

## 2021-09-14 DIAGNOSIS — E039 Hypothyroidism, unspecified: Secondary | ICD-10-CM | POA: Diagnosis present

## 2021-09-14 DIAGNOSIS — K219 Gastro-esophageal reflux disease without esophagitis: Secondary | ICD-10-CM | POA: Diagnosis present

## 2021-09-14 DIAGNOSIS — I5043 Acute on chronic combined systolic (congestive) and diastolic (congestive) heart failure: Secondary | ICD-10-CM

## 2021-09-14 DIAGNOSIS — Z6841 Body Mass Index (BMI) 40.0 and over, adult: Secondary | ICD-10-CM

## 2021-09-14 DIAGNOSIS — Z7901 Long term (current) use of anticoagulants: Secondary | ICD-10-CM

## 2021-09-14 LAB — COMPREHENSIVE METABOLIC PANEL
ALT: 14 U/L (ref 0–44)
AST: 21 U/L (ref 15–41)
Albumin: 2.7 g/dL — ABNORMAL LOW (ref 3.5–5.0)
Alkaline Phosphatase: 45 U/L (ref 38–126)
Anion gap: 7 (ref 5–15)
BUN: 9 mg/dL (ref 8–23)
CO2: 27 mmol/L (ref 22–32)
Calcium: 9 mg/dL (ref 8.9–10.3)
Chloride: 103 mmol/L (ref 98–111)
Creatinine, Ser: 1.14 mg/dL (ref 0.61–1.24)
GFR, Estimated: >60 mL/min (ref >60)
Glucose, Bld: 113 mg/dL — ABNORMAL HIGH (ref 70–99)
Potassium: 3.4 mmol/L — ABNORMAL LOW (ref 3.5–5.1)
Sodium: 137 mmol/L (ref 135–145)
Total Bilirubin: 1.3 mg/dL — ABNORMAL HIGH (ref 0.3–1.2)
Total Protein: 6.4 g/dL — ABNORMAL LOW (ref 6.5–8.1)

## 2021-09-14 LAB — MAGNESIUM: Magnesium: 1.7 mg/dL (ref 1.7–2.4)

## 2021-09-14 LAB — DIGOXIN LEVEL: Digoxin Level: <0.2 ng/mL — ABNORMAL LOW (ref 0.8–2.0)

## 2021-09-14 LAB — URINALYSIS, ROUTINE W REFLEX MICROSCOPIC
Bacteria, UA: NONE SEEN
Bilirubin Urine: NEGATIVE
Glucose, UA: NEGATIVE mg/dL
Hgb urine dipstick: NEGATIVE
Ketones, ur: NEGATIVE mg/dL
Leukocytes,Ua: NEGATIVE
Nitrite: NEGATIVE
Protein, ur: 100 mg/dL — AB
Specific Gravity, Urine: 1.017 (ref 1.005–1.030)
pH: 6 (ref 5.0–8.0)

## 2021-09-14 LAB — CBC WITH DIFFERENTIAL/PLATELET
Abs Immature Granulocytes: 0.08 10*3/uL — ABNORMAL HIGH (ref 0.00–0.07)
Basophils Absolute: 0 10*3/uL (ref 0.0–0.1)
Basophils Relative: 0 %
Eosinophils Absolute: 0 10*3/uL (ref 0.0–0.5)
Eosinophils Relative: 0 %
HCT: 33.1 % — ABNORMAL LOW (ref 39.0–52.0)
Hemoglobin: 10.4 g/dL — ABNORMAL LOW (ref 13.0–17.0)
Immature Granulocytes: 1 %
Lymphocytes Relative: 8 %
Lymphs Abs: 0.6 10*3/uL — ABNORMAL LOW (ref 0.7–4.0)
MCH: 31.5 pg (ref 26.0–34.0)
MCHC: 31.4 g/dL (ref 30.0–36.0)
MCV: 100.3 fL — ABNORMAL HIGH (ref 80.0–100.0)
Monocytes Absolute: 0.5 10*3/uL (ref 0.1–1.0)
Monocytes Relative: 6 %
Neutro Abs: 7 10*3/uL (ref 1.7–7.7)
Neutrophils Relative %: 85 %
Platelets: 253 10*3/uL (ref 150–400)
RBC: 3.3 MIL/uL — ABNORMAL LOW (ref 4.22–5.81)
RDW: 16.2 % — ABNORMAL HIGH (ref 11.5–15.5)
WBC: 8.2 10*3/uL (ref 4.0–10.5)
nRBC: 0 % (ref 0.0–0.2)

## 2021-09-14 LAB — IRON AND TIBC
Iron: 22 ug/dL — ABNORMAL LOW (ref 45–182)
Saturation Ratios: 9 % — ABNORMAL LOW (ref 17.9–39.5)
TIBC: 252 ug/dL (ref 250–450)
UIBC: 230 ug/dL

## 2021-09-14 LAB — PROTIME-INR
INR: 1.5 — ABNORMAL HIGH (ref 0.8–1.2)
Prothrombin Time: 17.5 seconds — ABNORMAL HIGH (ref 11.4–15.2)

## 2021-09-14 LAB — HEMOGLOBIN A1C
Hgb A1c MFr Bld: 4.9 % (ref 4.8–5.6)
Mean Plasma Glucose: 93.93 mg/dL

## 2021-09-14 LAB — APTT: aPTT: 36 seconds (ref 24–36)

## 2021-09-14 LAB — GLUCOSE, CAPILLARY
Glucose-Capillary: 120 mg/dL — ABNORMAL HIGH (ref 70–99)
Glucose-Capillary: 94 mg/dL (ref 70–99)

## 2021-09-14 LAB — VITAMIN B12: Vitamin B-12: 964 pg/mL — ABNORMAL HIGH (ref 180–914)

## 2021-09-14 LAB — LACTIC ACID, PLASMA
Lactic Acid, Venous: 1.2 mmol/L (ref 0.5–1.9)
Lactic Acid, Venous: 1.2 mmol/L (ref 0.5–1.9)

## 2021-09-14 LAB — FOLATE: Folate: 19 ng/mL (ref >5.9)

## 2021-09-14 LAB — FERRITIN: Ferritin: 248 ng/mL (ref 24–336)

## 2021-09-14 MED ORDER — METOPROLOL TARTRATE 50 MG PO TABS
50.0000 mg | ORAL_TABLET | Freq: Two times a day (BID) | ORAL | Status: DC
  Administered 2021-09-14: 50 mg via ORAL
  Filled 2021-09-14: qty 2

## 2021-09-14 MED ORDER — DIGOXIN 125 MCG PO TABS
0.1250 mg | ORAL_TABLET | Freq: Every day | ORAL | Status: DC
Start: 2021-09-14 — End: 2021-09-22
  Administered 2021-09-14 – 2021-09-22 (×9): 0.125 mg via ORAL
  Filled 2021-09-14 (×9): qty 1

## 2021-09-14 MED ORDER — LEVOTHYROXINE SODIUM 25 MCG PO TABS
137.0000 ug | ORAL_TABLET | Freq: Every day | ORAL | Status: DC
  Administered 2021-09-15 – 2021-09-22 (×8): 137 ug via ORAL
  Filled 2021-09-14 (×8): qty 1

## 2021-09-14 MED ORDER — SODIUM CHLORIDE 0.9 % IV SOLN
2.0000 g | Freq: Once | INTRAVENOUS | Status: AC
  Administered 2021-09-14: 2 g via INTRAVENOUS
  Filled 2021-09-14: qty 20

## 2021-09-14 MED ORDER — INSULIN ASPART 100 UNIT/ML IJ SOLN: 0.0000 [IU] | Freq: Three times a day (TID) | INTRAMUSCULAR | Status: DC

## 2021-09-14 MED ORDER — SODIUM CHLORIDE 0.9% FLUSH
3.0000 mL | INTRAVENOUS | Status: DC | PRN
Start: 2021-09-14 — End: 2021-09-22

## 2021-09-14 MED ORDER — HYDROCODONE-ACETAMINOPHEN 5-325 MG PO TABS
1.0000 | ORAL_TABLET | ORAL | Status: DC | PRN
  Administered 2021-09-15 – 2021-09-16 (×2): 2 via ORAL
  Filled 2021-09-14 (×2): qty 2

## 2021-09-14 MED ORDER — GABAPENTIN 600 MG PO TABS
600.0000 mg | ORAL_TABLET | Freq: Three times a day (TID) | ORAL | Status: DC
  Administered 2021-09-14 – 2021-09-22 (×23): 600 mg via ORAL
  Filled 2021-09-14 (×23): qty 1

## 2021-09-14 MED ORDER — SODIUM CHLORIDE 0.9% FLUSH
3.0000 mL | Freq: Two times a day (BID) | INTRAVENOUS | Status: DC
  Administered 2021-09-14 – 2021-09-22 (×17): 3 mL via INTRAVENOUS

## 2021-09-14 MED ORDER — ACETAMINOPHEN 325 MG PO TABS
650.0000 mg | ORAL_TABLET | Freq: Four times a day (QID) | ORAL | Status: DC | PRN
  Administered 2021-09-14: 650 mg via ORAL
  Filled 2021-09-14: qty 2

## 2021-09-14 MED ORDER — POTASSIUM CHLORIDE CRYS ER 20 MEQ PO TBCR
20.0000 meq | EXTENDED_RELEASE_TABLET | Freq: Once | ORAL | Status: AC
  Administered 2021-09-14: 20 meq via ORAL
  Filled 2021-09-14: qty 1

## 2021-09-14 MED ORDER — RIVAROXABAN 20 MG PO TABS
20.0000 mg | ORAL_TABLET | Freq: Every day | ORAL | Status: DC
  Administered 2021-09-14 – 2021-09-21 (×8): 20 mg via ORAL
  Filled 2021-09-14 (×8): qty 1

## 2021-09-14 MED ORDER — LACTATED RINGERS IV BOLUS
500.0000 mL | Freq: Once | INTRAVENOUS | Status: AC
  Administered 2021-09-14: 500 mL via INTRAVENOUS

## 2021-09-14 MED ORDER — METOPROLOL TARTRATE 25 MG PO TABS
25.0000 mg | ORAL_TABLET | Freq: Two times a day (BID) | ORAL | Status: DC
  Administered 2021-09-15 – 2021-09-20 (×11): 25 mg via ORAL
  Filled 2021-09-14 (×12): qty 1

## 2021-09-14 MED ORDER — SODIUM CHLORIDE 0.9 % IV SOLN: 250.0000 mL | INTRAVENOUS | Status: DC | PRN

## 2021-09-14 MED ORDER — SODIUM CHLORIDE 0.9 % IV SOLN
2.0000 g | INTRAVENOUS | Status: AC
  Administered 2021-09-15 – 2021-09-21 (×7): 2 g via INTRAVENOUS
  Filled 2021-09-14 (×7): qty 20

## 2021-09-14 MED ORDER — TORSEMIDE 20 MG PO TABS
20.0000 mg | ORAL_TABLET | Freq: Every day | ORAL | Status: DC
  Filled 2021-09-14: qty 1

## 2021-09-14 NOTE — Assessment & Plan Note (Signed)
Check magnesium Replete orally Trend

## 2021-09-14 NOTE — Assessment & Plan Note (Signed)
Appears to be extremely well controlled with A1C <6.5 off medication Will check A1C today and so SSI ac/hs for now which may be able to be discharged depending on A1C

## 2021-09-14 NOTE — Assessment & Plan Note (Signed)
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

## 2021-09-14 NOTE — Assessment & Plan Note (Addendum)
Appears to be around his baseline Check iron studies/b12/folate  Monitor

## 2021-09-14 NOTE — H&P (Signed)
History and Physical    Patient: [name] QAS:[id number] DOB: [birth year] DOA: 09/14/2021 DOS: the patient was seen and examined on 09/14/2021 PCP: Bernerd Limbo, MD  Patient coming from: [place]   Chief Complaint: fall   HPI: [name] is a 71 y.o. male with medical history significant of   pAfib, Hx of DVT, chronic diastolic HF, hypothyroidism, morbid obesity, chronic hypoxic resp failure on 4L [place] who presented to ED after a fall at home. He was trying to transfer from his bed to his WC and slipped and hit his head. He had a headache and is on xarelto so called 911. 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 He states he was not dizzy or lightheaded when fell. No chest pain or palpitations. He states he has had some light headedness at times with positional changes. He is unsure if redness in his leg is worse as he can't see it. Denies any drainage.    Denies any fever/chills, vision changes/headaches, chest pain or palpitations, shortness of breath or cough, abdominal pain, N/V/D, dysuria or leg swelling.   He just moved into an apartment to live on his own. His sister states the fire department has had to go out their 4 times in that time due to wellness check, falls, ER visit. He left SNF against recommendation of his family, but she states it's not working and he likely can't stay by himself.   He does not smoke or drink.   ER Course:  vitals: afebrile, bp: 120/74, HR: 127, RR: 37, oxygen: 98% on 3L [place] Pertinent labs: hgb: 10.4, potassium: 3.4,  CXR: borderline cardiomegly and low lung volumes without acute disease.  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 No fx or disease CT head: no acute finding CT cervical spine: no acute fracture. Severe degenerative changes  In ED:  given rocephin, BC obtained. TR[facility] asked to admit.    Review of Systems: As mentioned in the history of present illness. All other systems reviewed and are  negative. Past Medical History:  Diagnosis Date   CHF (congestive heart failure) ([place]) 05/2019   Chronic anticoagulation    with Xarelto   Complication of anesthesia    PT STATES HARD TO WAKE UP AFTER ONE SUGERY -STATES THE SURGERY TOOK LONGER THAN EXPECTED.  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   Shortness of breath    WITH EXERTION AND PAIN   Sleep apnea    uses CPAP,   Past Surgical History:  Procedure Laterality Date   2010 REMOVAL OF LEFT TOTAL KNEE     AMPUTATION Left 11/17/2019   Procedure: AMPUTATION ABOVE KNEE;  Surgeon: Newt Minion, MD;  Location: [place];  Service: Orthopedics;  Laterality: Left;   CARDIAC CATHETERIZATION  2006   CARDIOVERSION N/A 08/28/2012   Procedure: CARDIOVERSION;  Surgeon: Sanda Klein, MD;  Location: [place];  Service: Cardiovascular;  Laterality: N/A;  CARDIOVERSION N/A 09/01/2012   Procedure: CARDIOVERSION;  Surgeon: Pixie Casino, MD;  Location: [place];  Service: Cardiovascular;  Laterality: N/A;   COLONOSCOPY N/A 06/13/2013   Procedure: COLONOSCOPY;  Surgeon: Inda Castle, MD;  Location: [facility];  Service: Endoscopy;  Laterality: N/A;   EXCISIONAL TOTAL KNEE ARTHROPLASTY WITH ANTIBIOTIC SPACERS Left 09/21/2018   Procedure: Resection of tibia versus both components with placement of antibiotic spacer;  Surgeon: Paralee Cancel, MD;  Location: [facility];  Service: Orthopedics;  Laterality: Left;  2.5 hrs   EXCISIONAL TOTAL KNEE ARTHROPLASTY WITH ANTIBIOTIC SPACERS Left 01/02/2019   Procedure: Repeat Washout and placement of antibiotic  spacer left knee;  Surgeon: Paralee Cancel, MD;  Location: [facility];  Service: Orthopedics;  Laterality: Left;  2 hrs   HYDROCELECTOY   2012   I & D EXTREMITY Left 08/14/2019   Procedure: IRRIGATION AND DEBRIDEMENT LEFT KNEE WOUND;  Surgeon: Paralee Cancel, MD;  Location: [facility];  Service: Orthopedics;  Laterality: Left;  60 mins   I & D KNEE WITH POLY EXCHANGE Left 05/17/2019   Procedure: IRRIGATION AND DEBRIDEMENT KNEE WITH POLY EXCHANGE;  Surgeon: Paralee Cancel, MD;  Location: [facility];  Service: Orthopedics;  Laterality: Left;  90 mins   IRRIGATION AND DEBRIDEMENT KNEE Left 04/03/2019   Procedure: REIMPLANTATION OF LEFT KNEE TIBIAL COMPONENT.;  Surgeon: Paralee Cancel, MD;  Location: [facility];  Service: Orthopedics;  Laterality: Left;  need 120 min   IRRIGATION AND DEBRIDEMENT KNEE Left 11/06/2019   Procedure: IRRIGATION AND DEBRIDEMENT KNEE;  Surgeon: Paralee Cancel, MD;  Location: [facility];  Service: Orthopedics;  Laterality: Left;  90 mins   LEFT KNEE ARTHROSCOPY  1999   LEFT KNEE SURGERY UPPER TIBIAL OSTOMY     LEFT TOTAL KNEE REMOVAL FOR INFECTION  2006   REIMPLANTATION LEFT TOTAL KNEE  2008   REIMPLANTATION LEFT TOTAL KNEE   2006   REIMPLANTATION OF TOTAL KNEE Left 06/23/2012   Procedure: REIMPLANTATION OF LEFT TOTAL KNEE;  Surgeon: Mauri Pole, MD;  Location: [facility];  Service: Orthopedics;  Laterality: Left;   REMOVAL LEFT TOTAL KNEE   2008   REPLACEMENT LEFT KNEE  2002   REPLACEMENT RIGHT KNEE  2003   REVISION LEFT KNEE CAP   2004   RIGHT KNEE ARTHROSCOPY  1998   TEE WITHOUT CARDIOVERSION N/A 08/28/2012   Procedure: TRANSESOPHAGEAL ECHOCARDIOGRAM (TEE);  Surgeon: Sanda Klein, MD;  Location: [place];  Service: Cardiovascular;  Laterality: N/A;   TONSILLECTOMY     TOTAL KNEE REVISION Left 12/15/2015   Procedure: TOTAL KNEE REVISION REPLACEMENT;  Surgeon: Paralee Cancel, MD;  Location: [facility];  Service: Orthopedics;  Laterality: Left;  Adductor Block   Social History:  reports that he has never  smoked. He has never used smokeless tobacco. He reports that he does not currently use alcohol. He reports that he does not use drugs.  Allergies  Allergen Reactions   Morphine Hives    Family History  Problem Relation Age of Onset   Heart disease Father 71   Pancreatic cancer Sister    Liver cancer Sister    Cervical cancer Sister    Breast cancer Sister    Diabetes Sister    Colon cancer Neg Hx    Throat cancer Neg Hx    Stomach cancer Neg Hx    Kidney disease Neg Hx    Liver disease Neg Hx     Prior to Admission medications   Medication Sig Start Date  End Date Taking? Authorizing Provider  acetaminophen (TYLENOL) 500 MG tablet Take 500 mg by mouth every 6 (six) hours as needed for mild pain or fever.    [provider]  apixaban (ELIQUIS) 5 MG TABS tablet Take 5 mg by mouth 2 (two) times daily.    [provider]  apixaban (ELIQUIS) 5 MG TABS tablet Take 1 tablet (5 mg total) by mouth 2 (two) times daily. 09/07/21   Jeanell Sparrow, DO  capsaicin (ZOSTRIX) 0.025 % cream Apply 1 application topically every 8 (eight) hours as needed (phantom limb pain on the left thigh/leg).    [provider]  cephALEXin (KEFLEX) 500 MG capsule Take 1 capsule (500 mg total) by mouth 3 (three) times daily for 10 days. 09/07/21 09/17/21  Jeanell Sparrow, DO  clotrimazole (LOTRIMIN) 1 % cream Apply 1 application topically 2 (two) times daily. 03/12/21   [provider]  DESITIN 13 % CREA Apply 1 Application topically 2 (two) times daily as needed. 09/07/21   Jeanell Sparrow, DO  digoxin (LANOXIN) 0.125 MG tablet Take 1 tablet (0.125 mg total) by mouth daily. 04/29/21   Antonieta Pert, MD  digoxin (LANOXIN) 0.125 MG tablet Take 1 tablet (0.125 mg total) by mouth daily. 09/07/21 10/07/21  Jeanell Sparrow, DO  gabapentin (NEURONTIN) 800 MG tablet Take 800 mg by mouth 3 (three) times daily. 07/21/20   [provider]  gabapentin (NEURONTIN) 800 MG tablet Take 1 tablet (800 mg  total) by mouth 3 (three) times daily for 14 days. 09/07/21 09/21/21  Jeanell Sparrow, DO  guaiFENesin-dextromethorphan (ROBITUSSIN DM) 100-10 MG/5ML syrup Take 10 mLs by mouth every 4 (four) hours as needed for cough. Patient taking differently: Take 20 mLs by mouth every 4 (four) hours as needed for cough. 01/20/21   Shelly Coss, MD  levothyroxine (SYNTHROID) 125 MCG tablet Take 125 mcg by mouth daily before breakfast.  10/31/15   [provider]  levothyroxine (SYNTHROID) 125 MCG tablet Take 1 tablet (125 mcg total) by mouth daily before breakfast. 09/07/21 10/07/21  Jeanell Sparrow, DO  losartan (COZAAR) 25 MG tablet Take 1 tablet (25 mg total) by mouth daily. 04/29/21   Antonieta Pert, MD  losartan (COZAAR) 25 MG tablet Take 1 tablet (25 mg total) by mouth daily. 09/07/21 10/07/21  Jeanell Sparrow, DO  Melatonin 10 MG CAPS Take 10 mg by mouth at bedtime.    [provider]  methocarbamol (ROBAXIN) 500 MG tablet Take 500 mg by mouth every 6 (six) hours as needed for muscle spasms. 12/10/20   [provider]  metoprolol succinate (TOPROL-XL) 100 MG 24 hr tablet Take 1 tablet (100 mg total) by mouth daily. Take with or immediately following a meal. 04/29/21   Antonieta Pert, MD  metoprolol succinate (TOPROL-XL) 100 MG 24 hr tablet Take 1 tablet (100 mg total) by mouth daily. 09/07/21 10/07/21  Jeanell Sparrow, DO  Multiple Vitamin (MULTIVITAMIN WITH MINERALS) TABS Take 1 tablet by mouth daily.    [provider]  naphazoline-glycerin (CLEAR EYES REDNESS) 0.012-0.2 % SOLN Place 1-2 drops into both eyes 4 (four) times daily as needed for eye irritation. Patient not taking: Reported on 04/13/2021 11/21/19   Persons, Bevely Palmer, PA  nutrition supplement, JUVEN, (JUVEN) PACK Take 1 packet by mouth 2 (two) times daily with a meal. 04/29/21   Antonieta Pert, MD  Nutritional Supplements (,FEEDING SUPPLEMENT, PROSOURCE PLUS) liquid Take 30 mLs by mouth 3 (three) times daily between  meals. 04/29/21    Antonieta Pert, MD  potassium chloride (KLOR-CON M) 10 MEQ tablet Take 10 mEq by mouth daily. 01/06/21   [provider]  sulfamethoxazole-trimethoprim (BACTRIM DS) 800-160 MG tablet Take 1 tablet by mouth 2 (two) times daily for 10 days. 09/07/21 09/17/21  Jeanell Sparrow, DO  torsemide (DEMADEX) 20 MG tablet Take 1 tablet (20 mg total) by mouth daily. 04/29/21   Antonieta Pert, MD  torsemide (DEMADEX) 20 MG tablet Take 1 tablet (20 mg total) by mouth daily. 09/07/21 10/07/21  Jeanell Sparrow, DO  vitamin B-12 (CYANOCOBALAMIN) 1000 MCG tablet Take 1,000 mcg by mouth daily.    [provider]  vitamin C (ASCORBIC ACID) 500 MG tablet Take 500 mg by mouth daily.    [provider]  zinc gluconate 50 MG tablet Take 50 mg by mouth daily.    [provider]    Physical Exam: Vitals:   09/14/21 1615 09/14/21 1630 09/14/21 1708 09/14/21 1835  BP: (!) 149/133 121/72 (!) 98/59 (!) 87/66  Pulse: (!) 113 (!) 132 96 86  Resp: (!) 37 (!) 33 (!) 32   Temp:   99.7 F (37.6 C)   TempSrc:   Oral   SpO2: 95% 95% 97%   Weight:   (!) 169.9 kg   Height:   '5\' 8"'$  (1.727 m)    General:  Appears calm and comfortable and is in NAD. Morbidly obese Eyes:  PERRL, EOMI, normal lids, iris ENT:  grossly normal hearing, lips & tongue, mmm; appropriate dentition Neck:  no LAD, masses or thyromegaly; no carotid bruits Cardiovascular:  irregularly,irregular  no m/r/g.  Respiratory:   CTA bilaterally with no wheezes/rales/rhonchi.  Normal respiratory effort. Abdomen:  soft, NT, ND, NABS Back:   normal alignment, no CVAT Skin:  no rash or induration seen on limited exam. Left AKA with erythema and induration at stump and spreading erythema superiorly. No drainage or abscess. Testicles erythematous, not edematous.  Musculoskeletal:  grossly normal tone BUE/left AKA Lower extremity: .  Limited foot exam with no ulcerations.  2+ distal pulses. Psychiatric:  grossly normal mood and affect, speech fluent  and appropriate, AOx3 Neurologic:  CN 2-12 grossly intact, moves all extremities in coordinated fashion, sensation intact   Radiological Exams on Admission: Independently reviewed - see discussion in A/P where applicable  CT Head Wo Contrast  Result Date: 09/14/2021 CLINICAL DATA:  Status post fall with neck trauma. EXAM: CT HEAD WITHOUT CONTRAST CT CERVICAL SPINE WITHOUT CONTRAST TECHNIQUE: Multidetector CT imaging of the head and cervical spine was performed following the standard protocol without intravenous contrast. Multiplanar CT image reconstructions of the cervical spine were also generated. RADIATION DOSE REDUCTION: This exam was performed according to the departmental dose-optimization program which includes automated exposure control, adjustment of the mA and/or kV according to patient size and/or use of iterative reconstruction technique. COMPARISON:  CT of temporal bones September 07, 2021 FINDINGS: CT HEAD FINDINGS Brain: No evidence of acute infarction, hemorrhage, hydrocephalus, extra-axial collection or mass lesion/mass effect. Vascular: No hyperdense vessel is noted. Skull: Hyperostosis frontalis noted. Negative for fracture or focal lesion. Sinuses/Orbits: No acute finding. Other: None. CT CERVICAL SPINE FINDINGS Alignment: There is kyphosis of cervical spine. Skull base and vertebrae: No acute fracture. No primary bone lesion or focal pathologic process. Soft tissues and spinal canal: No prevertebral fluid or swelling. No visible canal hematoma. Disc levels: Severe degenerative joint changes of cervical spine with narrowed joint space and osteophyte formation are  identified throughout the cervical spine. Upper chest: Not included. Other: None. IMPRESSION: 1. No acute intracranial abnormality identified. 2. No acute fracture or subluxation of cervical spine. 3. 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 Electronically Signed   By: Abelardo Diesel M.D.   On: 09/14/2021 13:32   CT  Cervical Spine Wo Contrast  Result Date: 09/14/2021 CLINICAL DATA:  Status post fall with neck trauma. EXAM: CT HEAD WITHOUT CONTRAST CT CERVICAL SPINE WITHOUT CONTRAST TECHNIQUE: Multidetector CT imaging of the head and cervical spine was performed following the standard protocol without intravenous contrast. Multiplanar CT image reconstructions of the cervical spine were also generated. RADIATION DOSE REDUCTION: This exam was performed according to the departmental dose-optimization program which includes automated exposure control, adjustment of the mA and/or kV according to patient size and/or use of iterative reconstruction technique. COMPARISON:  CT of temporal bones September 07, 2021 FINDINGS: CT HEAD FINDINGS Brain: No evidence of acute infarction, hemorrhage, hydrocephalus, extra-axial collection or mass lesion/mass effect. Vascular: No hyperdense vessel is noted. Skull: Hyperostosis frontalis noted. Negative for fracture or focal lesion. Sinuses/Orbits: No acute finding. Other: None. CT CERVICAL SPINE FINDINGS Alignment: There is kyphosis of cervical spine. Skull base and vertebrae: No acute fracture. No primary bone lesion or focal pathologic process. Soft tissues and spinal canal: No prevertebral fluid or swelling. No visible canal hematoma. 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 Upper chest: Not included. Other: None. IMPRESSION: 1. No acute intracranial abnormality identified. 2. No acute fracture or subluxation of cervical spine. 3. 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 Electronically Signed   By: Abelardo Diesel M.D.   On: 09/14/2021 13:32   DG Femur Min 2 Views Left  Result Date: 09/14/2021 CLINICAL DATA:  Cellulitis of the left AKA stump. EXAM: LEFT FEMUR 2 VIEWS COMPARISON:  08/14/2020 FINDINGS: Patient is status post above the knee amputation. Intramedullary  metallic stem device noted at the distal end of the femoral bone. No definite bony destruction or lysis to suggest underlying osteomyelitis. IMPRESSION: Status post above-the-knee amputation. No evidence for bony lysis to suggest osteomyelitis. Electronically Signed   By: Misty Stanley M.D.   On: 09/14/2021 11:56   DG Shoulder Left  Result Date: 09/14/2021 CLINICAL DATA:  Pain after a fall EXAM: LEFT SHOULDER - 2+ VIEW COMPARISON:  None Available. FINDINGS: Visualized portion of the left hemithorax is normal. No acute fracture or dislocation. Advanced degenerative changes of the acromioclavicular and glenohumeral joints, including prominent humeral head osteophytes. IMPRESSION: Advanced degenerative change, without acute osseous finding. Electronically Signed   By: Abigail Miyamoto M.D.   On: 09/14/2021 11:05   DG Chest Port 1 View  Result Date: 09/14/2021 CLINICAL DATA:  Fall.  On blood thinners.  CHF. EXAM: PORTABLE CHEST 1 VIEW COMPARISON:  09/07/2021 CTA chest.  Plain film 09/07/2021. FINDINGS: Mildly degraded exam due to AP portable technique and patient body habitus. Apical lordotic positioning. Midline trachea. Borderline cardiomegaly. Atherosclerosis in the transverse aorta. No pleural effusion or pneumothorax. No congestive failure. Clear lungs. IMPRESSION: Borderline cardiomegaly and low lung volumes without acute disease. Aortic Atherosclerosis (ICD10-I70.0). Electronically Signed   By: Abigail Miyamoto M.D.   On: 09/14/2021 10:32    EKG: Independently reviewed.  Atrial fibrillation with rate 125; nonspecific ST changes with no evidence of acute ischemia   Labs on Admission: I have personally reviewed the available labs and imaging studies at the time  of the admission.  Pertinent labs:   hgb: 10.4,  potassium: 3.4,  Assessment and Plan: Principal Problem:   Left leg cellulitis Active Problems:   Hypokalemia   Chronic respiratory failure with hypoxia (HCC)   Fall at home, initial encounter    Persistent atrial fibrillation ([place])   Type 2 diabetes mellitus with complication, without long-term current use of insulin (HCC)   Macrocytic anemia   Hypothyroidism   OSA (obstructive sleep apnea)   Chronic systolic CHF (congestive heart failure) (HCC)    Assessment and Plan: * Left leg cellulitis 71 year old presenting with mechanical fall from home found to have cellulitis of his left AKA stump with fever to 100.2 and tachycardia -obs to telemetry -continue rocephin -blood cultures pending, lactic acid wnl -gave small 500cc bolus -no drainage or areas of seen abscess -if no improvement, consider imaging   Hypokalemia Check magnesium Replete orally Trend   Fall at home, initial encounter Fall while transferring Has had some light headedness with positional changes Check orthostatics Hold cozaar, unsure if taking.  Given 500cc bolus  CT head/cervical spine with no acute fracture PT/OT as he is back in apartment   Chronic respiratory failure with hypoxia (HCC) Baseline oxygen is 2-4L, stable  Persistent atrial fibrillation (HCC) Mildly elevated rate-keep on telemetry for now  Fever control TSH just checked and abnormal >7, dose increased by EDP.  Continue home meds of toprol-xl, digoxin and xarelto He is now home from a SNF and unsure what medication he is actually taking. Does state he takes lopressor and xarelto. Has not had today. Give lopressor dose now and digoxin  IV labetalol prn for rate control if needed  Check digoxin level   Type 2 diabetes mellitus with complication, without long-term current use of insulin (HCC) Appears to be extremely well controlled with A1C <6.5 off medication Will check A1C today and so SSI ac/hs for now which may be able to be discharged depending on A1C   Macrocytic anemia Appears to be around his baseline Check iron studies/b12/folate  Monitor   Hypothyroidism TSH elevated >7 on 7/31 Synthroid increased to 129mg, continue  this Outpatient labs in 6-8 weeks   OSA (obstructive sleep apnea) States he does not wear a cpap, just oxygen at night  After talking to sister, likely non compliant for over 2 years. No longer has cpap at home.   Chronic systolic CHF (congestive heart failure) (HCC) Appears euvolemic, difficult with body habitus Echo 3/23: EF of 45-50% with global hypokinesis  Continue lopressor, holding cozaar as unsure if he is taking and softer blood pressure readings  Strict I/O    Advance Care Planning:   Code Status: Full Code   Consults: PT/OT/SW  DVT Prophylaxis: eliquis   Family Communication: sister cindy pickles updated by phone  Severity of Illness: The appropriate patient status for this patient is OBSERVATION. Observation status is judged to be reasonable and necessary in order to provide the required intensity of service to ensure the patient's safety. The patient's presenting symptoms, physical exam findings, and initial radiographic and laboratory data in the context of their medical condition is felt to place them at decreased risk for further clinical deterioration. Furthermore, it is anticipated that the patient will be medically stable for discharge from the hospital within 2 midnights of admission.   Author: AOrma Flaming MD 09/14/2021 7:46 PM  For on call review [email or web address]

## 2021-09-14 NOTE — Progress Notes (Signed)
Orthopedic Tech Progress Note Patient Details:  [name] [birth year] [id number]  Level 2 trauma   Patient ID: [name], male   DOB: 11/12/1950, 71 y.o.   MRN: [id number]  [name] 09/14/2021, 12:21 PM

## 2021-09-14 NOTE — Progress Notes (Signed)
   09/14/21 1045  Clinical Encounter Type  Visited With Patient  Visit Type ED;Trauma;Initial  Referral From Nurse Ophelia Charter, RN)  Consult/Referral To Chaplain Melvenia Beam)  Recommendations Level 2 Trauma  Spiritual Encounters  Spiritual Needs Emotional   Chaplain responded to Spiritual Care Page for Level 2 Trauma patient in M.C.E.[facility] Room 20 where I met Mr. Guy Sandifer. [place] at patient's bedside. Mr. Quam stated that he tripped and fell to the floor at home, landing on his L Shoulder and striking his head. Patient's spiritual needs were being met and no family present or available. Patient declined need to contact anyone at this time. [address] [place], [place] [phone]

## 2021-09-14 NOTE — Assessment & Plan Note (Addendum)
Appears euvolemic, difficult with body habitus Echo 3/23: EF of 45-50% with global hypokinesis  Continue lopressor, holding cozaar as unsure if he is taking and softer blood pressure readings  Strict I/O

## 2021-09-14 NOTE — Assessment & Plan Note (Addendum)
Baseline oxygen is 2-4L, stable

## 2021-09-14 NOTE — ED Triage Notes (Signed)
Pt received to ER20. EMS reports pt calling for fall at home. Pt wears 2l [place] continuous. Pt on blood thinners.

## 2021-09-14 NOTE — Assessment & Plan Note (Signed)
Fall while transferring Has had some light headedness with positional changes Check orthostatics Hold cozaar, unsure if taking.  Given 500cc bolus  CT head/cervical spine with no acute fracture PT/OT as he is back in apartment

## 2021-09-14 NOTE — Assessment & Plan Note (Addendum)
TSH elevated >7 on 7/31 Synthroid increased to 185mg, continue this Outpatient labs in 6-8 weeks

## 2021-09-14 NOTE — ED Provider Notes (Signed)
[facility] EMERGENCY DEPARTMENT Provider Note   CSN: [id number] Arrival date & time: 09/14/21  1013     History  Chief Complaint  Patient presents with   [name] is a 71 y.o. male.  71 yo M with a chief complaint of a fall.  The patient has a left AKA and typically transfers okay but has fallen on occasion.  Per EMS had a fall which was not typical for him but had struck his head during this fall and is sent here for evaluation.  Patient denies any other specific change to his baseline.  Is having a little bit more left shoulder pain than normal.  Tells me that he did fall on that left shoulder.  He denies neck pain back pain chest pain abdominal pain.  Denies any difficulty breathing.  Is on oxygen at baseline at home.   Fall       Home Medications Prior to Admission medications   Medication Sig Start Date End Date Taking? Authorizing Provider  acetaminophen (TYLENOL) 500 MG tablet Take 500 mg by mouth every 6 (six) hours as needed for mild pain or fever.    [provider]  apixaban (ELIQUIS) 5 MG TABS tablet Take 5 mg by mouth 2 (two) times daily.    [provider]  apixaban (ELIQUIS) 5 MG TABS tablet Take 1 tablet (5 mg total) by mouth 2 (two) times daily. 09/07/21   Jeanell Sparrow, DO  capsaicin (ZOSTRIX) 0.025 % cream Apply 1 application topically every 8 (eight) hours as needed (phantom limb pain on the left thigh/leg).    [provider]  cephALEXin (KEFLEX) 500 MG capsule Take 1 capsule (500 mg total) by mouth 3 (three) times daily for 10 days. 09/07/21 09/17/21  Jeanell Sparrow, DO  clotrimazole (LOTRIMIN) 1 % cream Apply 1 application topically 2 (two) times daily. 03/12/21   [provider]  DESITIN 13 % CREA Apply 1 Application topically 2 (two) times daily as needed. 09/07/21   Jeanell Sparrow, DO  digoxin (LANOXIN) 0.125 MG tablet Take 1 tablet (0.125 mg total) by mouth daily. 04/29/21   Antonieta Pert, MD   digoxin (LANOXIN) 0.125 MG tablet Take 1 tablet (0.125 mg total) by mouth daily. 09/07/21 10/07/21  Jeanell Sparrow, DO  gabapentin (NEURONTIN) 800 MG tablet Take 800 mg by mouth 3 (three) times daily. 07/21/20   [provider]  gabapentin (NEURONTIN) 800 MG tablet Take 1 tablet (800 mg total) by mouth 3 (three) times daily for 14 days. 09/07/21 09/21/21  Jeanell Sparrow, DO  guaiFENesin-dextromethorphan (ROBITUSSIN DM) 100-10 MG/5ML syrup Take 10 mLs by mouth every 4 (four) hours as needed for cough. Patient taking differently: Take 20 mLs by mouth every 4 (four) hours as needed for cough. 01/20/21   Shelly Coss, MD  levothyroxine (SYNTHROID) 125 MCG tablet Take 125 mcg by mouth daily before breakfast.  10/31/15   [provider]  levothyroxine (SYNTHROID) 125 MCG tablet Take 1 tablet (125 mcg total) by mouth daily before breakfast. 09/07/21 10/07/21  Jeanell Sparrow, DO  losartan (COZAAR) 25 MG tablet Take 1 tablet (25 mg total) by mouth daily. 04/29/21   Antonieta Pert, MD  losartan (COZAAR) 25 MG tablet Take 1 tablet (25 mg total) by mouth daily. 09/07/21 10/07/21  Jeanell Sparrow, DO  Melatonin 10 MG CAPS Take 10 mg by mouth at bedtime.    [provider]  methocarbamol (ROBAXIN)  500 MG tablet Take 500 mg by mouth every 6 (six) hours as needed for muscle spasms. 12/10/20   [provider]  metoprolol succinate (TOPROL-XL) 100 MG 24 hr tablet Take 1 tablet (100 mg total) by mouth daily. Take with or immediately following a meal. 04/29/21   Antonieta Pert, MD  metoprolol succinate (TOPROL-XL) 100 MG 24 hr tablet Take 1 tablet (100 mg total) by mouth daily. 09/07/21 10/07/21  Jeanell Sparrow, DO  Multiple Vitamin (MULTIVITAMIN WITH MINERALS) TABS Take 1 tablet by mouth daily.    [provider]  naphazoline-glycerin (CLEAR EYES REDNESS) 0.012-0.2 % SOLN Place 1-2 drops into both eyes 4 (four) times daily as needed for eye irritation. Patient not taking: Reported on 04/13/2021  11/21/19   Persons, Bevely Palmer, PA  nutrition supplement, JUVEN, (JUVEN) PACK Take 1 packet by mouth 2 (two) times daily with a meal. 04/29/21   Antonieta Pert, MD  Nutritional Supplements (,FEEDING SUPPLEMENT, PROSOURCE PLUS) liquid Take 30 mLs by mouth 3 (three) times daily between meals. 04/29/21   Antonieta Pert, MD  potassium chloride (KLOR-CON M) 10 MEQ tablet Take 10 mEq by mouth daily. 01/06/21   [provider]  sulfamethoxazole-trimethoprim (BACTRIM DS) 800-160 MG tablet Take 1 tablet by mouth 2 (two) times daily for 10 days. 09/07/21 09/17/21  Jeanell Sparrow, DO  torsemide (DEMADEX) 20 MG tablet Take 1 tablet (20 mg total) by mouth daily. 04/29/21   Antonieta Pert, MD  torsemide (DEMADEX) 20 MG tablet Take 1 tablet (20 mg total) by mouth daily. 09/07/21 10/07/21  Jeanell Sparrow, DO  vitamin B-12 (CYANOCOBALAMIN) 1000 MCG tablet Take 1,000 mcg by mouth daily.    [provider]  vitamin C (ASCORBIC ACID) 500 MG tablet Take 500 mg by mouth daily.    [provider]  zinc gluconate 50 MG tablet Take 50 mg by mouth daily.    [provider]      Allergies    Morphine    Review of Systems   Review of Systems  Physical Exam Updated Vital Signs BP 113/83   Pulse (!) 109   Temp 98.7 F (37.1 C) (Oral)   Resp (!) 33   Ht '5\' 8"'$  (1.727 m)   Wt (!) 165.6 kg   SpO2 98%   BMI 55.50 kg/m  Physical Exam Vitals and nursing note reviewed.  Constitutional:      Appearance: He is well-developed.  HENT:     Head: Normocephalic and atraumatic.  Eyes:     Pupils: Pupils are equal, round, and reactive to light.  Neck:     Vascular: No JVD.  Cardiovascular:     Rate and Rhythm: Normal rate. Rhythm irregular.     Heart sounds: No murmur heard.    No friction rub. No gallop.  Pulmonary:     Effort: No respiratory distress.     Breath sounds: No wheezing.  Abdominal:     General: There is no distension.     Tenderness: There is no abdominal tenderness. There is no  guarding or rebound.  Musculoskeletal:        General: Normal range of motion.     Cervical back: Normal range of motion and neck supple.     Comments: Palpated from head to toe without any noted areas of bony tenderness.  The patient has a left AKA has a bandage on the left distal aspect with what appears to be some indurated tissue that he told me was  an abscess.  He has surrounding erythema that goes into the groin and up to the left hip.  Skin:    Coloration: Skin is not pale.     Findings: No rash.  Neurological:     Mental Status: He is alert and oriented to person, place, and time.  Psychiatric:        Behavior: Behavior normal.     ED Results / Procedures / Treatments   Labs (all labs ordered are listed, but only abnormal results are displayed) Labs Reviewed  CBC WITH DIFFERENTIAL/PLATELET - Abnormal; Notable for the following components:      Result Value   RBC 3.30 (*)    Hemoglobin 10.4 (*)    HCT 33.1 (*)    MCV 100.3 (*)    RDW 16.2 (*)    Lymphs Abs 0.6 (*)    Abs Immature Granulocytes 0.08 (*)    All other components within normal limits  COMPREHENSIVE METABOLIC PANEL - Abnormal; Notable for the following components:   Potassium 3.4 (*)    Glucose, Bld 113 (*)    Total Protein 6.4 (*)    Albumin 2.7 (*)    Total Bilirubin 1.3 (*)    All other components within normal limits  PROTIME-INR - Abnormal; Notable for the following components:   Prothrombin Time 17.5 (*)    INR 1.5 (*)    All other components within normal limits  URINALYSIS, ROUTINE W REFLEX MICROSCOPIC - Abnormal; Notable for the following components:   APPearance HAZY (*)    Protein, ur 100 (*)    All other components within normal limits  CULTURE, BLOOD (ROUTINE X 2)  CULTURE, BLOOD (ROUTINE X 2)  URINE CULTURE  LACTIC ACID, PLASMA  APTT  LACTIC ACID, PLASMA  MAGNESIUM  HEMOGLOBIN A1C  DIGOXIN LEVEL  VITAMIN B12  FOLATE  FERRITIN  IRON AND TIBC    EKG EKG  Interpretation  Date/Time:  Monday September 14 2021 12:46:58 EDT Ventricular Rate:  125 PR Interval:    QRS Duration: 130 QT Interval:  326 QTC Calculation: 471 R Axis:   -56 Text Interpretation: Atrial fibrillation Nonspecific IVCD with LAD Nonspecific T abnormalities, lateral leads No significant change since last tracing Confirmed by Deno Etienne [phone]) on 09/14/2021 1:21:04 PM  Radiology CT Head Wo Contrast  Result Date: 09/14/2021 CLINICAL DATA:  Status post fall with neck trauma. EXAM: CT HEAD WITHOUT CONTRAST CT CERVICAL SPINE WITHOUT CONTRAST TECHNIQUE: Multidetector CT imaging of the head and cervical spine was performed following the standard protocol without intravenous contrast. Multiplanar CT image reconstructions of the cervical spine were also generated. RADIATION DOSE REDUCTION: This exam was performed according to the departmental dose-optimization program which includes automated exposure control, adjustment of the mA and/or kV according to patient size and/or use of iterative reconstruction technique. COMPARISON:  CT of temporal bones September 07, 2021 FINDINGS: CT HEAD FINDINGS Brain: No evidence of acute infarction, hemorrhage, hydrocephalus, extra-axial collection or mass lesion/mass effect. Vascular: No hyperdense vessel is noted. Skull: Hyperostosis frontalis noted. Negative for fracture or focal lesion. Sinuses/Orbits: No acute finding. Other: None. CT CERVICAL SPINE FINDINGS Alignment: There is kyphosis of cervical spine. Skull base and vertebrae: No acute fracture. No primary bone lesion or focal pathologic process. Soft tissues and spinal canal: No prevertebral fluid or swelling. No visible canal hematoma. 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 Upper chest: Not included. Other: None.  IMPRESSION: 1. No acute intracranial abnormality identified. 2. No acute fracture  or subluxation of cervical spine. 3. 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 Electronically Signed   By: Abelardo Diesel M.D.   On: 09/14/2021 13:32   CT Cervical Spine Wo Contrast  Result Date: 09/14/2021 CLINICAL DATA:  Status post fall with neck trauma. EXAM: CT HEAD WITHOUT CONTRAST CT CERVICAL SPINE WITHOUT CONTRAST TECHNIQUE: Multidetector CT imaging of the head and cervical spine was performed following the standard protocol without intravenous contrast. Multiplanar CT image reconstructions of the cervical spine were also generated. RADIATION DOSE REDUCTION: This exam was performed according to the departmental dose-optimization program which includes automated exposure control, adjustment of the mA and/or kV according to patient size and/or use of iterative reconstruction technique. COMPARISON:  CT of temporal bones September 07, 2021 FINDINGS: CT HEAD FINDINGS Brain: No evidence of acute infarction, hemorrhage, hydrocephalus, extra-axial collection or mass lesion/mass effect. Vascular: No hyperdense vessel is noted. Skull: Hyperostosis frontalis noted. Negative for fracture or focal lesion. Sinuses/Orbits: No acute finding. Other: None. CT CERVICAL SPINE FINDINGS Alignment: There is kyphosis of cervical spine. Skull base and vertebrae: No acute fracture. No primary bone lesion or focal pathologic process. Soft tissues and spinal canal: No prevertebral fluid or swelling. No visible canal hematoma. 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 Upper chest: Not included. Other: None. IMPRESSION: 1. No acute intracranial abnormality identified. 2. No acute fracture or subluxation of cervical spine. 3. 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 Electronically Signed   By: Abelardo Diesel M.D.   On: 09/14/2021 13:32   DG Femur Min 2 Views Left  Result Date: 09/14/2021 CLINICAL  DATA:  Cellulitis of the left AKA stump. EXAM: LEFT FEMUR 2 VIEWS COMPARISON:  08/14/2020 FINDINGS: Patient is status post above the knee amputation. Intramedullary metallic stem device noted at the distal end of the femoral bone. No definite bony destruction or lysis to suggest underlying osteomyelitis. IMPRESSION: Status post above-the-knee amputation. No evidence for bony lysis to suggest osteomyelitis. Electronically Signed   By: Misty Stanley M.D.   On: 09/14/2021 11:56   DG Shoulder Left  Result Date: 09/14/2021 CLINICAL DATA:  Pain after a fall EXAM: LEFT SHOULDER - 2+ VIEW COMPARISON:  None Available. FINDINGS: Visualized portion of the left hemithorax is normal. No acute fracture or dislocation. Advanced degenerative changes of the acromioclavicular and glenohumeral joints, including prominent humeral head osteophytes. IMPRESSION: Advanced degenerative change, without acute osseous finding. Electronically Signed   By: Abigail Miyamoto M.D.   On: 09/14/2021 11:05   DG Chest Port 1 View  Result Date: 09/14/2021 CLINICAL DATA:  Fall.  On blood thinners.  CHF. EXAM: PORTABLE CHEST 1 VIEW COMPARISON:  09/07/2021 CTA chest.  Plain film 09/07/2021. FINDINGS: Mildly degraded exam due to AP portable technique and patient body habitus. Apical lordotic positioning. Midline trachea. Borderline cardiomegaly. Atherosclerosis in the transverse aorta. No pleural effusion or pneumothorax. No congestive failure. Clear lungs. IMPRESSION: Borderline cardiomegaly and low lung volumes without acute disease. Aortic Atherosclerosis (ICD10-I70.0). Electronically Signed   By: Abigail Miyamoto M.D.   On: 09/14/2021 10:32    Procedures Procedures    Medications Ordered in ED Medications  cefTRIAXone (ROCEPHIN) 2 g in sodium chloride 0.9 % 100 mL IVPB (has no administration in time range)  insulin aspart (novoLOG) injection 0-9 Units (has no administration in time range)    ED Course/  Medical Decision Making/ A&P                            Medical Decision Making Amount and/or Complexity of Data Reviewed Labs: ordered. Radiology: ordered. ECG/medicine tests: ordered.  Risk Decision regarding hospitalization.   71 yo M with a chief complaints of a fall.  Nonsyncopal by history.  Patient with no obvious complaints otherwise.  We will obtain a CT of the head and C-spine.  Patient also clinically has cellulitis.  He is a bit tachypneic with this.  We will obtain a laboratory evaluation bolus of IV fluids antibiotics reassess.  Blood work without leukocytosis no lactate elevation.  Plain film of the left femur without obvious signs of osteo-.  Discussed with medicine for admission.  The patients results and plan were reviewed and discussed.   Any x-rays performed were independently reviewed by myself.   Differential diagnosis were considered with the presenting HPI.  Medications  cefTRIAXone (ROCEPHIN) 2 g in sodium chloride 0.9 % 100 mL IVPB (has no administration in time range)  insulin aspart (novoLOG) injection 0-9 Units (has no administration in time range)    Vitals:   09/14/21 1245 09/14/21 1315 09/14/21 1330 09/14/21 1345  BP: 119/73 120/79 127/76 113/83  Pulse: 64 (!) 123 (!) 126 (!) 109  Resp: (!) 39 (!) 22 (!) 31 (!) 33  Temp:      TempSrc:      SpO2: 96% 96% 97% 98%  Weight:      Height:        Final diagnoses:  Cellulitis of left lower extremity  Injury of head, initial encounter    Admission/ observation were discussed with the admitting physician, patient and/or family and they are comfortable with the plan.          Final Clinical Impression(s) / ED Diagnoses Final diagnoses:  Cellulitis of left lower extremity  Injury of head, initial encounter    Rx / DC Orders ED Discharge Orders     None         Deno Etienne, DO 09/14/21 1436

## 2021-09-14 NOTE — Assessment & Plan Note (Addendum)
Mildly elevated rate-keep on telemetry for now  Fever control TSH just checked and abnormal >7, dose increased by EDP.  Continue home meds of toprol-xl, digoxin and xarelto He is now home from a SNF and unsure what medication he is actually taking. Does state he takes lopressor and xarelto. Has not had today. Give lopressor dose now and digoxin  IV labetalol prn for rate control if needed  Check digoxin level

## 2021-09-14 NOTE — Assessment & Plan Note (Addendum)
States he does not wear a cpap, just oxygen at night  After talking to sister, likely non compliant for over 2 years. No longer has cpap at home.

## 2021-09-15 DIAGNOSIS — S0990XA Unspecified injury of head, initial encounter: Secondary | ICD-10-CM | POA: Diagnosis present

## 2021-09-15 DIAGNOSIS — S78112A Complete traumatic amputation at level between left hip and knee, initial encounter: Secondary | ICD-10-CM

## 2021-09-15 DIAGNOSIS — G4733 Obstructive sleep apnea (adult) (pediatric): Secondary | ICD-10-CM | POA: Diagnosis present

## 2021-09-15 DIAGNOSIS — Z833 Family history of diabetes mellitus: Secondary | ICD-10-CM | POA: Diagnosis not present

## 2021-09-15 DIAGNOSIS — I5022 Chronic systolic (congestive) heart failure: Secondary | ICD-10-CM | POA: Diagnosis not present

## 2021-09-15 DIAGNOSIS — E876 Hypokalemia: Secondary | ICD-10-CM | POA: Diagnosis present

## 2021-09-15 DIAGNOSIS — W19XXXA Unspecified fall, initial encounter: Secondary | ICD-10-CM | POA: Diagnosis not present

## 2021-09-15 DIAGNOSIS — Z6841 Body Mass Index (BMI) 40.0 and over, adult: Secondary | ICD-10-CM

## 2021-09-15 DIAGNOSIS — Z8049 Family history of malignant neoplasm of other genital organs: Secondary | ICD-10-CM | POA: Diagnosis not present

## 2021-09-15 DIAGNOSIS — Z79899 Other long term (current) drug therapy: Secondary | ICD-10-CM | POA: Diagnosis not present

## 2021-09-15 DIAGNOSIS — Z20822 Contact with and (suspected) exposure to covid-19: Secondary | ICD-10-CM | POA: Diagnosis not present

## 2021-09-15 DIAGNOSIS — J9611 Chronic respiratory failure with hypoxia: Secondary | ICD-10-CM | POA: Diagnosis present

## 2021-09-15 DIAGNOSIS — Z8249 Family history of ischemic heart disease and other diseases of the circulatory system: Secondary | ICD-10-CM | POA: Diagnosis not present

## 2021-09-15 DIAGNOSIS — Z609 Problem related to social environment, unspecified: Secondary | ICD-10-CM | POA: Diagnosis present

## 2021-09-15 DIAGNOSIS — Y92009 Unspecified place in unspecified non-institutional (private) residence as the place of occurrence of the external cause: Secondary | ICD-10-CM

## 2021-09-15 DIAGNOSIS — D539 Nutritional anemia, unspecified: Secondary | ICD-10-CM | POA: Diagnosis present

## 2021-09-15 DIAGNOSIS — R2681 Unsteadiness on feet: Secondary | ICD-10-CM | POA: Diagnosis not present

## 2021-09-15 DIAGNOSIS — E039 Hypothyroidism, unspecified: Secondary | ICD-10-CM | POA: Diagnosis present

## 2021-09-15 DIAGNOSIS — I4819 Other persistent atrial fibrillation: Secondary | ICD-10-CM | POA: Diagnosis present

## 2021-09-15 DIAGNOSIS — M6281 Muscle weakness (generalized): Secondary | ICD-10-CM | POA: Diagnosis not present

## 2021-09-15 DIAGNOSIS — Y835 Amputation of limb(s) as the cause of abnormal reaction of the patient, or of later complication, without mention of misadventure at the time of the procedure: Secondary | ICD-10-CM | POA: Diagnosis present

## 2021-09-15 DIAGNOSIS — W010XXA Fall on same level from slipping, tripping and stumbling without subsequent striking against object, initial encounter: Secondary | ICD-10-CM | POA: Diagnosis present

## 2021-09-15 DIAGNOSIS — T8744 Infection of amputation stump, left lower extremity: Secondary | ICD-10-CM | POA: Diagnosis present

## 2021-09-15 DIAGNOSIS — Z7901 Long term (current) use of anticoagulants: Secondary | ICD-10-CM | POA: Diagnosis not present

## 2021-09-15 DIAGNOSIS — Z86718 Personal history of other venous thrombosis and embolism: Secondary | ICD-10-CM | POA: Diagnosis not present

## 2021-09-15 DIAGNOSIS — Z89612 Acquired absence of left leg above knee: Secondary | ICD-10-CM | POA: Diagnosis not present

## 2021-09-15 DIAGNOSIS — I110 Hypertensive heart disease with heart failure: Secondary | ICD-10-CM | POA: Diagnosis present

## 2021-09-15 DIAGNOSIS — I951 Orthostatic hypotension: Secondary | ICD-10-CM | POA: Diagnosis present

## 2021-09-15 DIAGNOSIS — I5042 Chronic combined systolic (congestive) and diastolic (congestive) heart failure: Secondary | ICD-10-CM | POA: Diagnosis present

## 2021-09-15 DIAGNOSIS — Z800 Family history of malignant neoplasm of digestive organs: Secondary | ICD-10-CM | POA: Diagnosis not present

## 2021-09-15 DIAGNOSIS — Z803 Family history of malignant neoplasm of breast: Secondary | ICD-10-CM | POA: Diagnosis not present

## 2021-09-15 DIAGNOSIS — L03116 Cellulitis of left lower limb: Secondary | ICD-10-CM | POA: Diagnosis present

## 2021-09-15 DIAGNOSIS — Z736 Limitation of activities due to disability: Secondary | ICD-10-CM | POA: Diagnosis not present

## 2021-09-15 DIAGNOSIS — E118 Type 2 diabetes mellitus with unspecified complications: Secondary | ICD-10-CM | POA: Diagnosis present

## 2021-09-15 LAB — BASIC METABOLIC PANEL
Anion gap: 8 (ref 5–15)
BUN: 15 mg/dL (ref 8–23)
CO2: 26 mmol/L (ref 22–32)
Calcium: 8.8 mg/dL — ABNORMAL LOW (ref 8.9–10.3)
Chloride: 104 mmol/L (ref 98–111)
Creatinine, Ser: 1 mg/dL (ref 0.61–1.24)
GFR, Estimated: >60 mL/min (ref >60)
Glucose, Bld: 101 mg/dL — ABNORMAL HIGH (ref 70–99)
Potassium: 4.1 mmol/L (ref 3.5–5.1)
Sodium: 138 mmol/L (ref 135–145)

## 2021-09-15 LAB — CBC
HCT: 32.2 % — ABNORMAL LOW (ref 39.0–52.0)
Hemoglobin: 9.8 g/dL — ABNORMAL LOW (ref 13.0–17.0)
MCH: 31.1 pg (ref 26.0–34.0)
MCHC: 30.4 g/dL (ref 30.0–36.0)
MCV: 102.2 fL — ABNORMAL HIGH (ref 80.0–100.0)
Platelets: 235 10*3/uL (ref 150–400)
RBC: 3.15 MIL/uL — ABNORMAL LOW (ref 4.22–5.81)
RDW: 16.3 % — ABNORMAL HIGH (ref 11.5–15.5)
WBC: 7.9 10*3/uL (ref 4.0–10.5)
nRBC: 0 % (ref 0.0–0.2)

## 2021-09-15 LAB — GLUCOSE, CAPILLARY
Glucose-Capillary: 107 mg/dL — ABNORMAL HIGH (ref 70–99)
Glucose-Capillary: 90 mg/dL (ref 70–99)
Glucose-Capillary: 109 mg/dL — ABNORMAL HIGH (ref 70–99)
Glucose-Capillary: 115 mg/dL — ABNORMAL HIGH (ref 70–99)

## 2021-09-15 LAB — HIV ANTIBODY (ROUTINE TESTING W REFLEX): HIV Screen 4th Generation wRfx: NONREACTIVE

## 2021-09-15 LAB — URINE CULTURE

## 2021-09-15 NOTE — Progress Notes (Signed)
[facility] Progress Note  Patient: [name]     GBT:[id number]  DOA: 09/14/2021   PCP: Bernerd Limbo, MD       Brief hospital course:   Is a 71 year old male with left AKA, morbid obesity, chronic systolic heart failure, persistent atrial fibrillation, hypothyroidism, obstructive sleep apnea on 3 L of oxygen only when asleep and resolved diabetes mellitus. He was at a rehab facility up until late July and now lives home alone. The patient presents after a fall while transferring from bed to wheelchair. In the ED, noted to be tachycardic and tachypneic with respiratory rates in the 30s with a temperature of 100.2. Felt to have left stump cellulitis, given a normal saline bolus and started on ceftriaxone. He was recently at a skilled nursing facility after being discharged from the hospital on 05/07/2021. On 7/31 he was seen in the ED for ear pain and felt to have otitis externa.  It was noted that he had been home for 3 days from the skilled nursing facility and did not have any medications.  It looks like he was prescribed cephalexin.  When questioned about this in the ED, he was not sure if he was taking it.  In fact, he does not know what medications he takes. His niece was going to go to his apartment to see which medication bottles are there.  Subjective:  The patient states he is feeling him better than he did yesterday.  He has some pain in his left leg.  No other complaints  Assessment and Plan: Principal Problem:   Left stump cellulitis - AKA -Failed cephalexin which she was scribed on 7/31 by the ED for an ear infection -About 80% of his left stump is involved with the cellulitis - Based on the outline on his skin, cellulitis does not appear to be progressing - Continue ceftriaxone - Follow-up blood cultures  Active Problems:   Hypotension- Chronic systolic CHF (congestive heart failure) ([place]) -Echo from March 2023 : EF 40 to 45% moderate RV  dilatation Orthostatic hypotension Cardizem, digoxin, furosemide, torsemide and Lopressor all listed as home medications -Currently metoprolol being continued for heart rate control -OT did orthostatics today and blood pressure dropped from 91/61 to 86/60 - Hold off on other medications as BP is still low    Hypokalemia -Has been replaced and repeat potassium is 4.1   Fall at home, initial encounter -Obtain PT eval -OT recommending long-term care facility - The patient transfers from bed to wheelchair at baseline    Persistent atrial fibrillation ([place]) -Continue Lopressor, digoxin and Xarelto    Type 2 diabetes mellitus with complication, without long-term current use of insulin ? - last A1c is 4 ad he is not on amy medications so his DM has resolved - dc SSI     Morbid obesity with BMI of 60.0-69.9, adult ([place]) Body mass index is 56.95 kg/m.    Macrocytic anemia - normal B12 and folate levels today    Hypothyroidism - cont Synthroid    OSA (obstructive sleep apnea) -Patient uses 3 L of oxygen at night and not a CPAP   Unilateral AKA, left ([facility])   DVT prophylaxis: Xarelto   Code Status: Full Code  Consultants: None Level of Care: Level of care: Telemetry Medical Disposition Plan:  Status is: Inpatient for cellulitis     Objective:   Vitals:   09/14/21 2357 09/15/21 0355 09/15/21 0420 09/15/21 0722  BP: 93/66 (!) 94/59 104/69 104/60  Pulse: 87  87 100 99  Resp: '20 20 20 '$ (!) 25  Temp: 97.7 F (36.5 C)  98.9 F (37.2 C) 98.6 F (37 C)  TempSrc: Oral  Oral Oral  SpO2: 98% 95% 100% 100%  Weight:      Height:       Filed Weights   09/14/21 1029 09/14/21 1708  Weight: (!) 165.6 kg (!) 169.9 kg   Exam: General exam: Appears comfortable  HEENT: PERRLA, oral mucosa moist, no sclera icterus or thrush Respiratory system: Clear to auscultation. Respiratory effort normal. Cardiovascular system: S1 & S2 heard, regular rate and rhythm Gastrointestinal system:  Abdomen soft, non-tender, nondistended. Normal bowel sounds   Central nervous system: Alert and oriented. No focal neurological deficits. Extremities: No cyanosis, clubbing  Skin: - severe erythema encompassing about 80% of left stump extending up the left hip with tenderness Psychiatry:  Mood & affect appropriate.    Imaging and lab data was personally reviewed    CBC: Recent Labs  Lab 09/14/21 1235  WBC 8.2  NEUTROABS 7.0  HGB 10.4*  HCT 33.1*  MCV 100.3*  PLT 119   Basic Metabolic Panel: Recent Labs  Lab 09/14/21 1235 09/14/21 1442  NA 137  --   K 3.4*  --   CL 103  --   CO2 27  --   GLUCOSE 113*  --   BUN 9  --   CREATININE 1.14  --   CALCIUM 9.0  --   MG  --  1.7   GFR: Estimated Creatinine Clearance: 93 mL/min (by C-G formula based on SCr of 1.14 mg/dL).  Scheduled Meds:  digoxin  0.125 mg Oral Daily   gabapentin  600 mg Oral TID   insulin aspart  0-9 Units Subcutaneous TID WC   levothyroxine  137 mcg Oral QAC breakfast   metoprolol tartrate  25 mg Oral BID   rivaroxaban  20 mg Oral Q supper   sodium chloride flush  3 mL Intravenous Q12H   Continuous Infusions:  sodium chloride     cefTRIAXone (ROCEPHIN)  IV       LOS: 0 days   Author: Debbe Odea  09/15/2021 7:59 AM

## 2021-09-15 NOTE — Progress Notes (Signed)
PHARMACY - PHYSICIAN COMMUNICATION CRITICAL VALUE ALERT - BLOOD CULTURE IDENTIFICATION (BCID)  [name] is an 71 y.o. male who presented to [facility] on 09/14/2021 with a chief complaint of a fall and celllulitis around his AKA site  Assessment:  One of four bottles is showing gram positive rods, probably contamination  Name of physician (or Provider) Contacted: Dr. Wynelle Cleveland notified  Current antibiotics: Ceftriaxone  Changes to prescribed antibiotics recommended: No changes needed - continue ceftriaxone for cellulitis   No results found for this or any previous visit.  [name] 09/15/2021  3:00 PM

## 2021-09-15 NOTE — Evaluation (Signed)
Occupational Therapy Evaluation Patient Details Name: [name] MRN: [id number] DOB: 1950/12/17 Today's Date: 09/15/2021   History of Present Illness Pt adm with LLE cellulitis after fall at home. Pt recently left SNF against advice and has called the fire department 4 times to get him off of the floor since return home from SNF.  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   Clinical Impression   PTA pt had lived at home for @ 2 weeks since  leaving his SNF. Since that time, he has called the Fire Dept at least 2x for lifting assistance after falls. Pt apparently unable to care for himself, using towels in bed when he has a BM as he is unable to transfer to the toilet for fear of falling. At baseline he is able to transfer himself using a slide board into his w/c. Since DC from SNF, has difficulty completing pericare and LB ADL due to body habitus and is at increased risk of developing MASD under pannus. Reports increased dizziness, with low BP during session: supine 91/61;sitting 102/77; return to supine; 86/70. Recommend placement at LTC with OT follow up. Acute OT to follow.  Pt emotional at times about his situation, not wanting to "live out his days in a place like that".      Recommendations for follow up therapy are one component of a multi-disciplinary discharge planning process, led by the attending physician.  Recommendations may be updated based on patient status, additional functional criteria and insurance authorization.   Follow Up Recommendations  OT at Long-term acute care hospital    Assistance Recommended at Discharge Frequent or constant Supervision/Assistance  Patient can return home with the following Two people to help with walking and/or transfers;A lot of help with bathing/dressing/bathroom;Assistance with  cooking/housework;Assist for transportation;Help with stairs or ramp for entrance    Functional Status Assessment  Patient has had a recent decline in their functional status and demonstrates the ability to make significant improvements in function in a reasonable and predictable amount of time.  Equipment Recommendations  None recommended by OT    Recommendations for Other Services Other (comment) (social work consult)     Precautions / Restrictions Precautions Precautions: Fall Restrictions Weight Bearing Restrictions: No      Mobility Bed Mobility Overal bed mobility: Needs Assistance Bed Mobility: Rolling, Sidelying to Sit, Sit to Sidelying Rolling: Max assist, +2 for physical assistance Sidelying to sit: Max assist, +2 for physical assistance     Sit to sidelying: Max assist, +2 for physical assistance General bed mobility comments: Pt has hospital bed at home and uses a trapeze bar    Transfers                   General transfer comment: used MAxislide to laterally scoot toward [facility]      Balance Overall balance assessment: Needs assistance   Sitting balance-Leahy Scale: Good                                     ADL either performed or assessed with clinical judgement   ADL Overall ADL's : Needs assistance/impaired     Grooming: Set up   Upper Body Bathing: Minimal assistance   Lower Body Bathing: Maximal assistance;Bed level  Upper Body Dressing : Minimal assistance;Bed level   Lower Body Dressing: Maximal assistance;Bed level               Functional mobility during ADLs: +2 for physical assistance;Maximal assistance (bed level)       Vision Baseline Vision/History: 1 Wears glasses       Perception     Praxis      Pertinent Vitals/Pain       Hand Dominance Right   Extremity/Trunk Assessment Upper Extremity Assessment Upper Extremity Assessment: Generalized weakness (B shoulder OA limiting movement but  WFL)   Lower Extremity Assessment Lower Extremity Assessment: Defer to PT evaluation   Cervical / Trunk Assessment Cervical / Trunk Assessment: Other exceptions (increased body habitus)   Communication Communication Communication: No difficulties   Cognition Arousal/Alertness: Awake/alert Behavior During Therapy: Flat affect Overall Cognitive Status: No family/caregiver present to determine baseline cognitive functioning (most likely at baseline)                                       General Comments  at risk for MASD under pannus; per MD apparent cellulitus L residual limb    Exercises     Shoulder Instructions      Home Living Family/patient expects to be discharged to:: Private residence Living Arrangements: Alone Available Help at Discharge: Friend(s);Available PRN/intermittently Type of Home: Apartment Home Access: Level entry     Home Layout: One level           Bathroom Accessibility: Yes   Home Equipment: Rollator (4 wheels);Rolling Walker (2 wheels);Wheelchair - manual;Cane - single point;Grab bars - toilet   Additional Comments: sliding board      Prior Functioning/Environment Prior Level of Function : Needs assist               ADLs Comments: Uses [employer] delivery for groceries, fixes sandwiches. Does sponge bath. Uses urinal for voiding. Uses towels in the bed for bowel movements.        OT Problem List: Decreased strength;Decreased range of motion;Decreased activity tolerance;Impaired balance (sitting and/or standing);Decreased safety awareness;Decreased knowledge of use of DME or AE;Cardiopulmonary status limiting activity;Obesity;Pain;Impaired UE functional use;Increased edema      OT Treatment/Interventions: Self-care/ADL training;Energy conservation;DME and/or AE instruction;Therapeutic activities;Patient/family education;Balance training    OT Goals(Current goals can be found in the care plan section) Acute Rehab OT  Goals Patient Stated Goal: to not have to live in a facility OT Goal Formulation: With patient Time For Goal Achievement: 09/29/21 Potential to Achieve Goals: Good  OT Frequency: Min 2X/week    Co-evaluation PT/OT/SLP Co-Evaluation/Treatment: Yes Reason for Co-Treatment: For patient/therapist safety;To address functional/ADL transfers   OT goals addressed during session: ADL's and self-care      AM-PAC OT "6 Clicks" Daily Activity     Outcome Measure Help from another person eating meals?: None Help from another person taking care of personal grooming?: A Little Help from another person toileting, which includes using toliet, bedpan, or urinal?: A Lot Help from another person bathing (including washing, rinsing, drying)?: A Lot Help from another person to put on and taking off regular upper body clothing?: A Little Help from another person to put on and taking off regular lower body clothing?: A Lot 6 Click Score: 16   End of Session Nurse Communication: Mobility status;Need for lift equipment  Activity Tolerance: Patient tolerated treatment well Patient left: in bed;with  call bell/phone within reach;with bed alarm set  OT Visit Diagnosis: Other abnormalities of gait and mobility (R26.89);Muscle weakness (generalized) (M62.81);History of falling (Z91.81);Repeated falls (R29.6);Pain Pain - Right/Left: Right Pain - part of body: Knee                Time: 1010-1044 OT Time Calculation (min): 34 min Charges:  OT General Charges $OT Visit: 1 Visit OT Evaluation $OT Eval Moderate Complexity: [place], OT/L   Acute OT Clinical Specialist [place] Pager [phone] Office [phone]   [facility] 09/15/2021, 10:53 AM

## 2021-09-15 NOTE — Plan of Care (Signed)
  Problem: Coping: Goal: Ability to adjust to condition or change in health will improve Outcome: Progressing   Problem: Fluid Volume: Goal: Ability to maintain a balanced intake and output will improve Outcome: Progressing   Problem: Health Behavior/Discharge Planning: Goal: Ability to identify and utilize available resources and services will improve Outcome: Progressing Goal: Ability to manage health-related needs will improve Outcome: Progressing   Problem: Metabolic: Goal: Ability to maintain appropriate glucose levels will improve Outcome: Progressing   Problem: Nutritional: Goal: Maintenance of adequate nutrition will improve Outcome: Progressing Goal: Progress toward achieving an optimal weight will improve Outcome: Progressing   Problem: Skin Integrity: Goal: Risk for impaired skin integrity will decrease Outcome: Progressing   Problem: Tissue Perfusion: Goal: Adequacy of tissue perfusion will improve Outcome: Progressing   Problem: Education: Goal: Knowledge of General Education information will improve Description: Including pain rating scale, medication(s)/side effects and non-pharmacologic comfort measures Outcome: Progressing   Problem: Health Behavior/Discharge Planning: Goal: Ability to manage health-related needs will improve Outcome: Progressing   Problem: Clinical Measurements: Goal: Ability to maintain clinical measurements within normal limits will improve Outcome: Progressing Goal: Diagnostic test results will improve Outcome: Progressing Goal: Respiratory complications will improve Outcome: Progressing Goal: Cardiovascular complication will be avoided Outcome: Progressing   Problem: Activity: Goal: Risk for activity intolerance will decrease Outcome: Progressing   Problem: Nutrition: Goal: Adequate nutrition will be maintained Outcome: Progressing

## 2021-09-15 NOTE — Evaluation (Signed)
Physical Therapy Evaluation Patient Details Name: [name] MRN: [id number] DOB: 12/24/1950 Today's Date: 09/15/2021  History of Present Illness  Pt adm with LLE cellulitis after fall at home. Pt recently left SNF against advice and has called the fire department 4 times to get him off of the floor since return home from SNF.  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  Clinical Impression  Pt presents to PT with poor mobility and inability to care for himself in home environment. Pt reports 2 falls at home with transfer since he came home from facility. He has called the fire department to get him up. Pt with difficulty performing basic ADL's. Pt likely will need long term custodial care to assist him with mobility and ADL's. Pt acknowledges that being at home alone is not working well. He expresses sadness over the thought of being in a facility and did not like the facility that he was in last. If pt returns home alone. Expect that he will continue to fall and to struggle or be unable to care for himself.        Recommendations for follow up therapy are one component of a multi-disciplinary discharge planning process, led by the attending physician.  Recommendations may be updated based on patient status, additional functional criteria and insurance authorization.  Follow Up Recommendations Long-term institutional care without follow-up therapy Can patient physically be transported by private vehicle: No    Assistance Recommended at Discharge Frequent or constant Supervision/Assistance  Patient can return home with the following  Two people to help with walking and/or transfers;Two people to help with bathing/dressing/bathroom    Equipment Recommendations Other (comment) (hoyer lift)  Recommendations for Other Services        Functional Status Assessment Patient has had a recent decline in their functional status and/or demonstrates limited ability to make significant improvements in function in a reasonable and predictable amount of time     Precautions / Restrictions Precautions Precautions: Fall      Mobility  Bed Mobility Overal bed mobility: Needs Assistance Bed Mobility: Rolling, Sidelying to Sit, Sit to Sidelying Rolling: Max assist, +2 for physical assistance Sidelying to sit: Max assist, +2 for physical assistance     Sit to sidelying: Max assist, +2 for physical assistance General bed mobility comments: Assist to bring shoulders and hips over to roll. Assist to elevate trunk into sitting and bring hips to EOB. Assist to lower trunk and bring legs back up into bed.    Transfers                   General transfer comment: used MAxislide to laterally scoot 12" toward HOB with +2 max assist    Ambulation/Gait                  Stairs            Wheelchair Mobility    Modified Rankin (Stroke Patients Only)       Balance Overall balance assessment: Needs assistance Sitting-balance support: No upper extremity supported, Feet supported Sitting balance-Leahy Scale: Good                                       Pertinent Vitals/Pain Pain Assessment Pain  Assessment: 0-10 Pain Score: 5  Pain Location: rt knee > lt residual limb Pain Descriptors / Indicators: Guarding, Grimacing Pain Intervention(s): Limited activity within patient's tolerance, Monitored during session, Repositioned    Home Living Family/patient expects to be discharged to:: Private residence Living Arrangements: Alone Available Help at Discharge: Friend(s);Available PRN/intermittently Type of Home: Apartment Home Access: Level entry       Home Layout: One level Home Equipment: Rollator (4 wheels);Rolling Walker (2 wheels);Wheelchair - manual;Cane - single point;Grab bars -  toilet Additional Comments: sliding board    Prior Function Prior Level of Function : Needs assist             Mobility Comments: Performs bed to w/c transfers with sliding board modified independent but with multiple falls at home ADLs Comments: Uses [employer] delivery for groceries, fixes sandwiches. Does sponge bath. Uses urinal for voiding. Uses towels in the bed for bowel movements.     Hand Dominance   Dominant Hand: Right    Extremity/Trunk Assessment   Upper Extremity Assessment Upper Extremity Assessment: Defer to OT evaluation    Lower Extremity Assessment Lower Extremity Assessment: RLE deficits/detail;LLE deficits/detail RLE Deficits / Details: Strength <3/5 and limited ROM due to painful knee and body habitus LLE Deficits / Details: AKA    Cervical / Trunk Assessment Cervical / Trunk Assessment: Other exceptions (increased body habitus)  Communication   Communication: No difficulties  Cognition Arousal/Alertness: Awake/alert Behavior During Therapy: Flat affect Overall Cognitive Status: No family/caregiver present to determine baseline cognitive functioning (most likely at baseline)                                          General Comments      Exercises     Assessment/Plan    PT Assessment Patient needs continued PT services  PT Problem List Decreased strength;Decreased range of motion;Decreased activity tolerance;Decreased balance;Decreased mobility;Obesity;Pain       PT Treatment Interventions DME instruction;Functional mobility training;Therapeutic activities;Therapeutic exercise;Balance training;Patient/family education    PT Goals (Current goals can be found in the Care Plan section)  Acute Rehab PT Goals PT Goal Formulation: With patient Time For Goal Achievement: 09/29/21 Potential to Achieve Goals: Fair    Frequency Min 2X/week     Co-evaluation PT/OT/SLP Co-Evaluation/Treatment: Yes Reason for Co-Treatment: For  patient/therapist safety PT goals addressed during session: Mobility/safety with mobility         AM-PAC PT "6 Clicks" Mobility  Outcome Measure Help needed turning from your back to your side while in a flat bed without using bedrails?: Total Help needed moving from lying on your back to sitting on the side of a flat bed without using bedrails?: Total Help needed moving to and from a bed to a chair (including a wheelchair)?: Total Help needed standing up from a chair using your arms (e.g., wheelchair or bedside chair)?: Total Help needed to walk in hospital room?: Total Help needed climbing 3-5 steps with a railing? : Total 6 Click Score: 6    End of Session Equipment Utilized During Treatment: Oxygen Activity Tolerance: Patient limited by pain;Other (comment) (body habitus) Patient left: in bed;with call bell/phone within reach;with bed alarm set Nurse Communication: Mobility status PT Visit Diagnosis: Other abnormalities of gait and mobility (R26.89);Muscle weakness (generalized) (M62.81);History of falling (Z91.81);Pain Pain - Right/Left: Right Pain - part of body: Knee    Time: 6834-1962 PT Time  Calculation (min) (ACUTE ONLY): 30 min   Charges:   PT Evaluation $PT Eval Moderate Complexity: [place] Office [place] 09/15/2021, 3:12 PM

## 2021-09-16 ENCOUNTER — Inpatient Hospital Stay (HOSPITAL_COMMUNITY): Payer: Medicare Other

## 2021-09-16 DIAGNOSIS — L03116 Cellulitis of left lower limb: Secondary | ICD-10-CM | POA: Diagnosis not present

## 2021-09-16 LAB — BASIC METABOLIC PANEL
Anion gap: 6 (ref 5–15)
BUN: 16 mg/dL (ref 8–23)
CO2: 28 mmol/L (ref 22–32)
Calcium: 8.7 mg/dL — ABNORMAL LOW (ref 8.9–10.3)
Chloride: 104 mmol/L (ref 98–111)
Creatinine, Ser: 1 mg/dL (ref 0.61–1.24)
GFR, Estimated: >60 mL/min (ref >60)
Glucose, Bld: 124 mg/dL — ABNORMAL HIGH (ref 70–99)
Potassium: 4.5 mmol/L (ref 3.5–5.1)
Sodium: 138 mmol/L (ref 135–145)

## 2021-09-16 LAB — CBC
HCT: 31.5 % — ABNORMAL LOW (ref 39.0–52.0)
Hemoglobin: 9.5 g/dL — ABNORMAL LOW (ref 13.0–17.0)
MCH: 31.3 pg (ref 26.0–34.0)
MCHC: 30.2 g/dL (ref 30.0–36.0)
MCV: 103.6 fL — ABNORMAL HIGH (ref 80.0–100.0)
Platelets: 199 10*3/uL (ref 150–400)
RBC: 3.04 MIL/uL — ABNORMAL LOW (ref 4.22–5.81)
RDW: 16 % — ABNORMAL HIGH (ref 11.5–15.5)
WBC: 4.6 10*3/uL (ref 4.0–10.5)
nRBC: 0 % (ref 0.0–0.2)

## 2021-09-16 LAB — GLUCOSE, CAPILLARY
Glucose-Capillary: 98 mg/dL (ref 70–99)
Glucose-Capillary: 107 mg/dL — ABNORMAL HIGH (ref 70–99)
Glucose-Capillary: 113 mg/dL — ABNORMAL HIGH (ref 70–99)
Glucose-Capillary: 102 mg/dL — ABNORMAL HIGH (ref 70–99)

## 2021-09-16 LAB — MRSA NEXT GEN BY PCR, NASAL: MRSA by PCR Next Gen: DETECTED — AB

## 2021-09-16 MED ORDER — IOHEXOL 300 MG/ML  SOLN
100.0000 mL | Freq: Once | INTRAMUSCULAR | Status: AC | PRN
  Administered 2021-09-16: 100 mL via INTRAVENOUS

## 2021-09-16 MED ORDER — MUPIROCIN 2 % EX OINT
1.0000 | TOPICAL_OINTMENT | Freq: Two times a day (BID) | CUTANEOUS | Status: AC
  Administered 2021-09-16 – 2021-09-21 (×10): 1 via NASAL
  Filled 2021-09-16 (×4): qty 22

## 2021-09-16 MED ORDER — CHLORHEXIDINE GLUCONATE CLOTH 2 % EX PADS
6.0000 | MEDICATED_PAD | Freq: Every day | CUTANEOUS | Status: DC
  Administered 2021-09-17: 6 via TOPICAL

## 2021-09-16 MED ORDER — VANCOMYCIN HCL 10 G IV SOLR
2500.0000 mg | Freq: Once | INTRAVENOUS | Status: AC
Start: 2021-09-16 — End: 2021-09-16
  Administered 2021-09-16: 2500 mg via INTRAVENOUS
  Filled 2021-09-16: qty 2500

## 2021-09-16 MED ORDER — VANCOMYCIN HCL 1250 MG/250ML IV SOLN
1250.0000 mg | Freq: Two times a day (BID) | INTRAVENOUS | Status: DC
  Administered 2021-09-17 – 2021-09-22 (×11): 1250 mg via INTRAVENOUS
  Filled 2021-09-16 (×12): qty 250

## 2021-09-16 NOTE — NC FL2 (Signed)
[place] LEVEL OF CARE SCREENING TOOL     IDENTIFICATION  Patient Name: [name] Birthdate: November 02, 1950 Sex: male Admission Date (Current Location): 09/14/2021  [facility] and [place] Number:  Herbalist and Address:  [facility] [place], [place] [address] [place], [place] [address]      Provider Number: [id number]  Attending Physician Name and Address:  Georgette Shell, MD  Relative Name and Phone Number:  Evans Lance (Sister)   [phone] [facility])    Current Level of Care: Hospital Recommended Level of Care: [place] Prior Approval Number:    Date Approved/Denied:   PASRR Number:    Discharge Plan: Home    Current Diagnoses: Patient Active Problem List   Diagnosis Date Noted   Unilateral AKA, left ([place]) 09/15/2021   Hypokalemia 09/14/2021   Left leg cellulitis 62/56/3893   Chronic systolic CHF (congestive heart failure) ([place]) 09/14/2021   Fall at home, initial encounter 09/14/2021   Acute on chronic combined systolic and diastolic CHF (congestive heart failure) ([place]) 04/17/2021   Physical deconditioning 04/17/2021   Cellulitis of groin, right 04/14/2021   Pressure injury of skin 04/14/2021   Acute respiratory failure with hypoxia ([place]) 01/19/2021   Hypothyroidism    Macrocytic anemia    Morbid obesity with BMI of 60.0-69.9, adult ([place])    Pneumonia due to COVID-19 virus 01/15/2021   Cellulitis of left leg    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[place])    Infection of prosthetic left knee joint ([place]) 11/08/2019   Septic joint of left knee joint ([place]) 11/06/2019   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[place]) 08/15/2019   Open knee wound 08/14/2019   S/P left TK revision 04/03/2019   Administration of long-term prophylactic antibiotics    History of streptococcal infection    History of DVT (deep vein thrombosis)    S/P rev left TK 12/15/2015   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    Preoperative cardiovascular examination 04/17/2013   OSA (obstructive sleep apnea)-3 L of oxygen at night and not CPAP 11/27/2012   Chronic anticoagulation, with Xarelto 09/10/2012   DVT (deep venous thrombosis), possible 09/10/2012   SOB (shortness of breath) 08/26/2012   Chest discomfort 08/26/2012   Persistent atrial fibrillation ([place]) 08/26/2012   Super obesity 06/27/2012   Expected blood loss anemia 06/27/2012   S/P left TK revision 06/23/2012   Septic arthritis of knee ([place])    Cellulitis 09/19/2010   METHICILLIN RESISTANT STAPHYLOCOCCUS AUREUS INFECTION 09/10/2009   STREPTOCOCCUS INFECTION CCE & UNS SITE GROUP C 07/04/2008   CHRONIC KIDNEY DISEASE UNSPECIFIED 07/04/2008   INFECTION DUE TO INTERNAL ORTH DEVICE NEC 03/02/2006    Orientation RESPIRATION BLADDER Height & Weight     Self, Time, Situation, Place  O2 (3LNC) Continent, External catheter Weight: (!) 375 lb 7.1 oz (170.3 kg) Height:  '5\' 8"'$  (172.7 cm)  BEHAVIORAL SYMPTOMS/MOOD NEUROLOGICAL BOWEL NUTRITION STATUS      Continent Diet (see d/c summary)  AMBULATORY STATUS COMMUNICATION OF NEEDS Skin   Extensive Assist Verbally Normal                       Personal Care Assistance Level of Assistance  Bathing, Feeding, Dressing Bathing Assistance: Maximum assistance Feeding assistance: Independent Dressing Assistance: Maximum assistance     Functional Limitations Info  Sight, Hearing, Speech Sight Info: Adequate Hearing Info: Adequate Speech Info: Adequate    SPECIAL CARE FACTORS FREQUENCY  Contractures Contractures Info: Not present    Additional Factors Info  Code Status, Allergies Code Status Info: Full code Allergies Info: morphine           Current Medications (09/16/2021):  This is the current hospital active medication list Current Facility-Administered Medications  Medication Dose Route Frequency Provider Last Rate Last Admin   0.9 %  sodium chloride infusion   250 mL Intravenous PRN Orma Flaming, MD       acetaminophen (TYLENOL) tablet 650 mg  650 mg Oral Q6H PRN Orma Flaming, MD   650 mg at 09/14/21 1640   cefTRIAXone (ROCEPHIN) 2 g in sodium chloride 0.9 % 100 mL IVPB  2 g Intravenous Q24H Orma Flaming, MD   Stopped at 09/15/21 1305   digoxin (LANOXIN) tablet 0.125 mg  0.125 mg Oral Daily Orma Flaming, MD   0.125 mg at 09/16/21 0935   gabapentin (NEURONTIN) tablet 600 mg  600 mg Oral TID Orma Flaming, MD   600 mg at 09/16/21 0936   HYDROcodone-acetaminophen (NORCO/VICODIN) 5-325 MG per tablet 1-2 tablet  1-2 tablet Oral Q4H PRN Orma Flaming, MD   2 tablet at 09/16/21 0941   levothyroxine (SYNTHROID) tablet 137 mcg  137 mcg Oral QAC breakfast Orma Flaming, MD   137 mcg at 09/16/21 7782   metoprolol tartrate (LOPRESSOR) tablet 25 mg  25 mg Oral BID Orma Flaming, MD   25 mg at 09/16/21 0935   rivaroxaban (XARELTO) tablet 20 mg  20 mg Oral Q supper Orma Flaming, MD   20 mg at 09/15/21 1649   sodium chloride flush (NS) 0.9 % injection 3 mL  3 mL Intravenous Q12H Orma Flaming, MD   3 mL at 09/16/21 0936   sodium chloride flush (NS) 0.9 % injection 3 mL  3 mL Intravenous PRN Orma Flaming, MD         Discharge Medications: Please see discharge summary for a list of discharge medications.  Relevant Imaging Results:  Relevant Lab Results:   Additional Information SS# [id number] Pt will be private pay for [place], LCSW

## 2021-09-16 NOTE — Progress Notes (Addendum)
Pharmacy Antibiotic Note  Mattis Featherly is a 71 y.o. male admitted on 09/14/2021 with cellulitis.  Pharmacy has been consulted for Vancomycin dosing.  Plan: Start loading dose of Vancomycin IV '2500mg'$  x 1  Start maintenance dose of Vancomycin '1250mg'$  IV q12 hours - Estimated AUC of 490. Scr utilized 1.00, Vd coefficient 0.5   Consider checking a level once at steady state.  Will continue to monitor renal fx, s/sx of infection, and cultures as available.   Height: '5\' 8"'$  (172.7 cm) Weight: (!) 170.3 kg (375 lb 7.1 oz) IBW/kg (Calculated) : 68.4  Temp (24hrs), Avg:98.2 F (36.8 C), Min:98 F (36.7 C), Max:98.5 F (36.9 C)  Recent Labs  Lab 09/14/21 1235 09/14/21 1442 09/15/21 0711 09/16/21 0404  WBC 8.2  --  7.9 4.6  CREATININE 1.14  --  1.00 1.00  LATICACIDVEN 1.2 1.2  --   --     Estimated Creatinine Clearance: 106.2 mL/min (by C-G formula based on SCr of 1 mg/dL).    Allergies  Allergen Reactions   Morphine Hives    Antimicrobials this admission: Vancomycin 8/8 >> Ceftriaxone 8/8 >>   Dose adjustments this admission: N/A  Microbiology results: 8/7 BCx: Gram + rods  8/7 UCx: recollection suggested   8/9 MRSA PCR: positive  Thank you for allowing pharmacy to be a part of this patient's care.  Vicenta Dunning, PharmD  PGY1 Pharmacy Resident

## 2021-09-16 NOTE — Progress Notes (Signed)
PROGRESS NOTE    [name]  EQA:[id number] DOB: [birth year] DOA: 09/14/2021 PCP: Bernerd Limbo, MD    Brief Narrative: 71 year old male with left above-knee amputation, morbid obesity, chronic persistent atrial fibrillation, hypothyroidism, obstructive sleep apnea on 3 L of oxygen no CPAP, chronic systolic heart failure admitted after a fall while he was trying to transfer himself from bed to wheelchair.  He is admitted with a diagnosis of left stump cellulitis.  He is on ceftriaxone.  Assessment & Plan:   Principal Problem:   Left leg cellulitis Active Problems:   Hypokalemia   Fall at home, initial encounter   Persistent atrial fibrillation ([place])   Morbid obesity with BMI of 60.0-69.9, adult (HCC)   Macrocytic anemia   Hypothyroidism   OSA (obstructive sleep apnea)-3 L of oxygen at night and not CPAP   Cellulitis   Chronic systolic CHF (congestive heart failure) (HCC)   Unilateral AKA, left ([place])  #1 left stump cellulitis-patient has been started on ceftriaxone since admission.  However his stump remains extremely warm to touch and tender. Blood cultures have been negative. He is very concerned that this will end up as a sepsis he had in the past. CT of the left femur stump ordered to rule out deep infection. Will consult ID.  #2 chronic systolic heart failure stable  #3 orthostatic hypotension he has on multiple medications at home for hypertension including metoprolol Lasix furosemide torsemide digoxin Cardizem These are being held due to orthostasis. Metoprolol has been started for rate control.  #4 persistent atrial fibrillation on digoxin Xarelto and Lopressor  #5 morbid obesity complicates overall prognosis BMI of 56.9  #6 obstructive sleep apnea not on CPAP  #7 hypothyroidism on Synthroid   Pressure Injury 04/13/21 Thigh Posterior;Proximal;Right Stage 2 -  Partial thickness loss of dermis presenting as a shallow open injury with a red, pink wound bed  without slough. (Active)  04/13/21 1700  Location: Thigh  Location Orientation: Posterior;Proximal;Right  Staging: Stage 2 -  Partial thickness loss of dermis presenting as a shallow open injury with a red, pink wound bed without slough.  Wound Description (Comments):   Present on Admission: Yes    Estimated body mass index is 57.09 kg/m as calculated from the following:   Height as of this encounter: '5\' 8"'$  (1.727 m).   Weight as of this encounter: 170.3 kg.  DVT prophylaxis: Xarelto  code Status: Full code Family Communication: None at bedside Disposition Plan:  Status is: Inpatient Remains inpatient appropriate because: Cellulitis   Consultants:  ID  Procedures: None Antimicrobials: Rocephin  Subjective: Patient complains of severe pain on the left stump still warm painful  Objective: Vitals:   09/16/21 0804 09/16/21 1004 09/16/21 1137 09/16/21 1204  BP: 92/62 102/80 90/67 (!) 85/66  Pulse: (!) 108 (!) 101 89 91  Resp: '19 14 20 14  '$ Temp:      TempSrc:      SpO2: 100% 95% (!) 87% 96%  Weight:      Height:        Intake/Output Summary (Last 24 hours) at 09/16/2021 1604 Last data filed at 09/16/2021 1300 Gross per 24 hour  Intake 1137.33 ml  Output 950 ml  Net 187.33 ml   Filed Weights   09/14/21 1029 09/14/21 1708 09/16/21 0521  Weight: (!) 165.6 kg (!) 169.9 kg (!) 170.3 kg    Examination:  General exam: Appears in distress due to stump pain Respiratory system: Clear to auscultation. Respiratory effort normal. Cardiovascular  system: S1 & S2 heard, RRR. No JVD, murmurs, rubs, gallops or clicks. No pedal edema. Gastrointestinal system: Abdomen is nondistended, soft and nontender. No organomegaly or masses felt. Normal bowel sounds heard. Central nervous system: Alert and oriented. No focal neurological deficits. Extremities: Left stump warm tender erythematous skin: No rashes, lesions or ulcers Psychiatry: Judgement and insight appear normal. Mood & affect  appropriate.     Data Reviewed: I have personally reviewed following labs and imaging studies  CBC: Recent Labs  Lab 09/14/21 1235 09/15/21 0711 09/16/21 0404  WBC 8.2 7.9 4.6  NEUTROABS 7.0  --   --   HGB 10.4* 9.8* 9.5*  HCT 33.1* 32.2* 31.5*  MCV 100.3* 102.2* 103.6*  PLT 253 235 237   Basic Metabolic Panel: Recent Labs  Lab 09/14/21 1235 09/14/21 1442 09/15/21 0711 09/16/21 0404  NA 137  --  138 138  K 3.4*  --  4.1 4.5  CL 103  --  104 104  CO2 27  --  26 28  GLUCOSE 113*  --  101* 124*  BUN 9  --  15 16  CREATININE 1.14  --  1.00 1.00  CALCIUM 9.0  --  8.8* 8.7*  MG  --  1.7  --   --    GFR: Estimated Creatinine Clearance: 106.2 mL/min (by C-G formula based on SCr of 1 mg/dL). Liver Function Tests: Recent Labs  Lab 09/14/21 1235  AST 21  ALT 14  ALKPHOS 45  BILITOT 1.3*  PROT 6.4*  ALBUMIN 2.7*   No results for input(s): "LIPASE", "AMYLASE" in the last 168 hours. No results for input(s): "AMMONIA" in the last 168 hours. Coagulation Profile: Recent Labs  Lab 09/14/21 1235  INR 1.5*   Cardiac Enzymes: No results for input(s): "CKTOTAL", "CKMB", "CKMBINDEX", "TROPONINI" in the last 168 hours. BNP (last 3 results) No results for input(s): "PROBNP" in the last 8760 hours. HbA1C: Recent Labs    09/14/21 1442  HGBA1C 4.9   CBG: Recent Labs  Lab 09/15/21 1603 09/15/21 2113 09/16/21 0612 09/16/21 1135 09/16/21 1600  GLUCAP 109* 115* 98 107* 113*   Lipid Profile: No results for input(s): "CHOL", "HDL", "LDLCALC", "TRIG", "CHOLHDL", "LDLDIRECT" in the last 72 hours. Thyroid Function Tests: No results for input(s): "TSH", "T4TOTAL", "FREET4", "T3FREE", "THYROIDAB" in the last 72 hours. Anemia Panel: Recent Labs    09/14/21 1442  VITAMINB12 964*  FOLATE 19.0  FERRITIN 248  TIBC 252  IRON 22*   Sepsis Labs: Recent Labs  Lab 09/14/21 1235 09/14/21 1442  LATICACIDVEN 1.2 1.2    Recent Results (from the past 240 hour(s))  Resp  Panel by RT-PCR (Flu A&B, Covid) Anterior Nasal Swab     Status: None   Collection Time: 09/07/21  3:49 PM   Specimen: Anterior Nasal Swab  Result Value Ref Range Status   SARS Coronavirus 2 by RT PCR NEGATIVE NEGATIVE Final    Comment: (NOTE) SARS-CoV-2 target nucleic acids are NOT DETECTED.  The SARS-CoV-2 RNA is generally detectable in upper respiratory specimens during the acute phase of infection. The lowest concentration of SARS-CoV-2 viral copies this assay can detect is 138 copies/mL. A negative result does not preclude SARS-Cov-2 infection and should not be used as the sole basis for treatment or other patient management decisions. A negative result may occur with  improper specimen collection/handling, submission of specimen other than nasopharyngeal swab, presence of viral mutation(s) within the areas targeted by this assay, and inadequate number of viral copies(<138  copies/mL). A negative result must be combined with clinical observations, patient history, and epidemiological information. The expected result is Negative.  Fact Sheet for Patients:  EntrepreneurPulse.com.au  Fact Sheet for Healthcare Providers:  IncredibleEmployment.be  This test is no t yet approved or cleared by the [place] FDA and  has been authorized for detection and/or diagnosis of SARS-CoV-2 by FDA under an Emergency Use Authorization (EUA). This EUA will remain  in effect (meaning this test can be used) for the duration of the COVID-19 declaration under Section 564(b)(1) of the Act, 21 U.S.C.section 360bbb-3(b)(1), unless the authorization is terminated  or revoked sooner.       Influenza A by PCR NEGATIVE NEGATIVE Final   Influenza B by PCR NEGATIVE NEGATIVE Final    Comment: (NOTE) The Xpert Xpress SARS-CoV-2/FLU/RSV plus assay is intended as an aid in the diagnosis of influenza from Nasopharyngeal swab specimens and should not be used as a sole  basis for treatment. Nasal washings and aspirates are unacceptable for Xpert Xpress SARS-CoV-2/FLU/RSV testing.  Fact Sheet for Patients: EntrepreneurPulse.com.au  Fact Sheet for Healthcare Providers: IncredibleEmployment.be  This test is not yet approved or cleared by the [place] FDA and has been authorized for detection and/or diagnosis of SARS-CoV-2 by FDA under an Emergency Use Authorization (EUA). This EUA will remain in effect (meaning this test can be used) for the duration of the COVID-19 declaration under Section 564(b)(1) of the Act, 21 U.S.C. section 360bbb-3(b)(1), unless the authorization is terminated or revoked.  Performed at [facility], [place] [address], [place], [place] [address]   Urine Culture     Status: Abnormal   Collection Time: 09/14/21 11:21 AM   Specimen: In/Out Cath Urine  Result Value Ref Range Status   Specimen Description IN/OUT CATH URINE  Final   Special Requests   Final    NONE Performed at [facility], [place] [address] [place], [place] [address]    Culture MULTIPLE SPECIES PRESENT, SUGGEST RECOLLECTION (A)  Final   Report Status 09/15/2021 FINAL  Final  Blood culture (routine x 2)     Status: None (Preliminary result)   Collection Time: 09/14/21 12:55 PM   Specimen: BLOOD  Result Value Ref Range Status   Specimen Description BLOOD RIGHT FOREARM  Final   Special Requests   Final    BOTTLES DRAWN AEROBIC AND ANAEROBIC Blood Culture adequate volume   Culture   Final    NO GROWTH 2 DAYS Performed at [facility], [place] [address] [place], [place] [address]    Report Status PENDING  Incomplete  Blood culture (routine x 2)     Status: None (Preliminary result)   Collection Time: 09/14/21  2:42 PM   Specimen: BLOOD  Result Value Ref Range Status   Specimen Description BLOOD RIGHT ANTECUBITAL  Final   Special Requests   Final    BOTTLES DRAWN AEROBIC AND ANAEROBIC Blood  Culture adequate volume   Culture  Setup Time   Final    GRAM POSITIVE RODS AEROBIC BOTTLE ONLY CRITICAL RESULT CALLED TO, READ BACK BY AND VERIFIED WITH: J. FRENS PHARMD, AT 6010 09/15/21 D.VANHOOK Performed at [facility], [place] [address] [place], [place] [address]    Culture GRAM POSITIVE RODS  Final   Report Status PENDING  Incomplete         Radiology Studies: No results found.      Scheduled Meds:  digoxin  0.125 mg Oral Daily  gabapentin  600 mg Oral TID   levothyroxine  137 mcg Oral QAC breakfast   metoprolol tartrate  25 mg Oral BID   rivaroxaban  20 mg Oral Q supper   sodium chloride flush  3 mL Intravenous Q12H   Continuous Infusions:  sodium chloride     cefTRIAXone (ROCEPHIN)  IV 2 g (09/16/21 1344)     LOS: 1 day    Time spent: 35 min  Georgette Shell, MD 09/16/2021, 4:04 PM

## 2021-09-16 NOTE — TOC Initial Note (Signed)
Transition of Care [facility]) - Initial/Assessment Note    Patient Details  Name: [name] MRN: [id number] Date of Birth: [birth year]  Transition of [facility] CM/SW Contact:    [name], [place] Phone Number: 09/16/2021, 10:45 AM  Clinical Narrative:                   CSW met with pt to discuss LTC recommendation. Provides history that he was living at [facility] before he transitioned to [facility]. [facility] was unable to accept pt back due to the level of assistance he needed. He went to [facility] in March 2023 up until a few weeks ago. He has exhausted his medicare coverage for STR at SNF and eventually was paying privately. He wanted to try living at home independently so rented an apartment and moved in but struggled within a short amount of time. He reports he has no assistance available from family or friends and was having difficulties transferring and moving within the apartment. Pt expresses some ambivalence about DC plan though indicates an understanding that he is unable to live at home alone. He is agreeable for CSW to start LTC SNF workup. He understands it would be private pay. He would not be appropriate for ALF level of care.   Fl2 completed and bed requests sent in hub.   Expected Discharge Plan: Skilled Nursing Facility Barriers to Discharge: Continued Medical Work up   Patient Goals and CMS Choice        Expected Discharge Plan and Services Expected Discharge Plan: [place]       Living arrangements for the past 2 months: Apartment                                      Prior Living Arrangements/Services Living arrangements for the past 2 months: Apartment Lives with:: Self Patient language and need for interpreter reviewed:: Yes        Need for Family Participation in Patient Care: No (Comment) Care giver support system in place?: No (comment)   Criminal Activity/Legal Involvement Pertinent to  Current Situation/Hospitalization: No - Comment as needed  Activities of Daily Living Home Assistive Devices/Equipment: Wheelchair ADL Screening (condition at time of admission) Patient's cognitive ability adequate to safely complete daily activities?: Yes Is the patient deaf or have difficulty hearing?: No Does the patient have difficulty seeing, even when wearing glasses/contacts?: No Does the patient have difficulty concentrating, remembering, or making decisions?: No Patient able to express need for assistance with ADLs?: Yes Does the patient have difficulty dressing or bathing?: No Independently performs ADLs?: Yes (appropriate for developmental age) Does the patient have difficulty walking or climbing stairs?: Yes Weakness of Legs: Both Weakness of Arms/Hands: None  Permission Sought/Granted   Permission granted to share information with : Yes, Verbal Permission Granted  Share Information with NAME: [name],Garrett (Sister)   [phone] (Mobile)           Emotional Assessment Appearance:: Appears stated age Attitude/Demeanor/Rapport: Other (comment) (Calm) Affect (typically observed): Sad Orientation: : Oriented to Self, Oriented to Place, Oriented to  Time, Oriented to Situation Alcohol / Substance Use: Not Applicable Psych Involvement: No (comment)  Admission diagnosis:  Cellulitis of left lower extremity [L03.116] Left leg cellulitis [L03.116] Injury of head, initial encounter [S09.90XA] Cellulitis [L03.90] Patient Active Problem List   Diagnosis Date Noted   Unilateral AKA, left ([place])  09/15/2021   Hypokalemia 09/14/2021   Left leg cellulitis 43/32/9518   Chronic systolic CHF (congestive heart failure) ([place]) 09/14/2021   Fall at home, initial encounter 09/14/2021   Acute on chronic combined systolic and diastolic CHF (congestive heart failure) ([place]) 04/17/2021   Physical deconditioning 04/17/2021   Cellulitis of groin, right 04/14/2021   Pressure injury of skin  04/14/2021   Acute respiratory failure with hypoxia ([place]) 01/19/2021   Hypothyroidism    Macrocytic anemia    Morbid obesity with BMI of 60.0-69.9, adult ([place])    Pneumonia due to COVID-19 virus 01/15/2021   Cellulitis of left leg    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[place])    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   Septic joint of left knee joint ([place]) 11/06/2019   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   Open knee wound 08/14/2019   S/P left TK revision 04/03/2019   Administration of long-term prophylactic antibiotics    History of streptococcal infection    History of DVT (deep vein thrombosis)    S/P rev left TK 12/15/2015   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   Preoperative cardiovascular examination 04/17/2013   OSA (obstructive sleep apnea)-3 L of oxygen at night and not CPAP 11/27/2012   Chronic anticoagulation, with Xarelto 09/10/2012   DVT (deep venous thrombosis), possible 09/10/2012   SOB (shortness of breath) 08/26/2012   Chest discomfort 08/26/2012   Persistent atrial fibrillation ([place]) 08/26/2012   Super obesity 06/27/2012   Expected blood loss anemia 06/27/2012   S/P left TK revision 06/23/2012   Septic arthritis of knee ([place])    Cellulitis 09/19/2010   METHICILLIN RESISTANT STAPHYLOCOCCUS AUREUS INFECTION 09/10/2009   STREPTOCOCCUS INFECTION CCE & UNS SITE GROUP C 07/04/2008   CHRONIC KIDNEY DISEASE UNSPECIFIED 07/04/2008   INFECTION DUE TO INTERNAL ORTH DEVICE NEC 03/02/2006   PCP:  Bernerd Limbo, MD Pharmacy:   [facility] [place], [address] [place] [address] Phone: [phone] Fax: [phone]  [facility] - [place], [place] [address] Phone: [phone] Fax: [phone]     Social Determinants of Health  (SDOH) Interventions    Readmission Risk Interventions    11/09/2019    1:42 PM  Readmission Risk Prevention Plan  Transportation Screening Complete  PCP or Specialist Appt within 5-7 Days Complete  Home Care Screening Complete  Medication Review (RN CM) Complete

## 2021-09-16 NOTE — Progress Notes (Signed)
Patient's MRSA PCR test came back positive. Notified MD, placed stranding orders.

## 2021-09-17 ENCOUNTER — Inpatient Hospital Stay (HOSPITAL_COMMUNITY): Payer: Medicare Other

## 2021-09-17 DIAGNOSIS — L03116 Cellulitis of left lower limb: Secondary | ICD-10-CM | POA: Diagnosis not present

## 2021-09-17 HISTORY — PX: IR US GUIDE BX ASP/DRAIN: IMG2392

## 2021-09-17 LAB — COMPREHENSIVE METABOLIC PANEL
ALT: 17 U/L (ref 0–44)
AST: 21 U/L (ref 15–41)
Albumin: 2.1 g/dL — ABNORMAL LOW (ref 3.5–5.0)
Alkaline Phosphatase: 58 U/L (ref 38–126)
Anion gap: 4 — ABNORMAL LOW (ref 5–15)
BUN: 15 mg/dL (ref 8–23)
CO2: 28 mmol/L (ref 22–32)
Calcium: 8.8 mg/dL — ABNORMAL LOW (ref 8.9–10.3)
Chloride: 105 mmol/L (ref 98–111)
Creatinine, Ser: 1.01 mg/dL (ref 0.61–1.24)
GFR, Estimated: >60 mL/min (ref >60)
Glucose, Bld: 109 mg/dL — ABNORMAL HIGH (ref 70–99)
Potassium: 4.3 mmol/L (ref 3.5–5.1)
Sodium: 137 mmol/L (ref 135–145)
Total Bilirubin: 0.4 mg/dL (ref 0.3–1.2)
Total Protein: 6.1 g/dL — ABNORMAL LOW (ref 6.5–8.1)

## 2021-09-17 LAB — CULTURE, BLOOD (ROUTINE X 2): Special Requests: ADEQUATE

## 2021-09-17 LAB — GLUCOSE, CAPILLARY
Glucose-Capillary: 95 mg/dL (ref 70–99)
Glucose-Capillary: 99 mg/dL (ref 70–99)
Glucose-Capillary: 114 mg/dL — ABNORMAL HIGH (ref 70–99)

## 2021-09-17 LAB — CBC
HCT: 32 % — ABNORMAL LOW (ref 39.0–52.0)
Hemoglobin: 9.8 g/dL — ABNORMAL LOW (ref 13.0–17.0)
MCH: 31.5 pg (ref 26.0–34.0)
MCHC: 30.6 g/dL (ref 30.0–36.0)
MCV: 102.9 fL — ABNORMAL HIGH (ref 80.0–100.0)
Platelets: 205 10*3/uL (ref 150–400)
RBC: 3.11 MIL/uL — ABNORMAL LOW (ref 4.22–5.81)
RDW: 15.8 % — ABNORMAL HIGH (ref 11.5–15.5)
WBC: 3.2 10*3/uL — ABNORMAL LOW (ref 4.0–10.5)
nRBC: 0 % (ref 0.0–0.2)

## 2021-09-17 LAB — SEDIMENTATION RATE: Sed Rate: 91 mm/hr — ABNORMAL HIGH (ref 0–16)

## 2021-09-17 LAB — C-REACTIVE PROTEIN: CRP: 20.4 mg/dL — ABNORMAL HIGH (ref <1.0)

## 2021-09-17 MED ORDER — LIDOCAINE HCL (PF) 1 % IJ SOLN
INTRAMUSCULAR | Status: DC | PRN
  Administered 2021-09-17: 10 mL

## 2021-09-17 MED ORDER — CHLORHEXIDINE GLUCONATE CLOTH 2 % EX PADS
6.0000 | MEDICATED_PAD | Freq: Every day | CUTANEOUS | Status: DC
  Administered 2021-09-18 – 2021-09-22 (×5): 6 via TOPICAL

## 2021-09-17 MED ORDER — LIDOCAINE HCL 1 % IJ SOLN
INTRAMUSCULAR | Status: AC
  Filled 2021-09-17: qty 20

## 2021-09-17 NOTE — Consult Note (Signed)
Patient Status: [facility]  Assessment and Plan: Left lower extremity stump cellulitis Patient admitted after fall at home with clinical concern for cellulitis to his left AKA.   CT Femur shows: 2. Chronic low-density collection abutting the resection margin of the distal femur measures approximately 5.6 x 2.1 x 5.3 cm, decreased in size from prior (previously measuring up to 7.8 cm). This likely reflects a component of fibrous tissue and nonspecific fluid. No new focal fluid collections or soft tissue gas.  IR consulted for aspiration. Case reviewed and approved by Dr. Earleen Newport.   Risks and benefits was discussed with the patient and/or patient's family including, but not limited to bleeding, infection, damage to adjacent structures or low yield requiring additional tests.  All of the questions were answered and there is agreement to proceed.  Consent signed and in chart.  ______________________________________________________________________   History of Present Illness: [name] is a 71 y.o. male with past medical history of L AKA due to infected left knee replacement who has sustained several falls at home.  Most recently he fell 8/7 hitting his head and was sent to the ED for evaluation.  At that time he was found to have left lower extremity stump cellulitis.  CT Femur shows stable defect/fluid collection.  IR has been consulted for aspiration.  Case reviewed by Dr. Earleen Newport who approves patient for case.  No sedation is planned.   Patient assessed at bedside.  His L stump is red, warm to the touch.  There is a small scab of the lateral aspect which is intact.  There is no active drainage.  No pus is able to be expressed.   Allergies and medications reviewed.   Review of Systems: A 12 point ROS discussed and pertinent positives are indicated in the HPI above.  All other systems are negative.  Review of Systems  Constitutional:  Negative for fatigue and fever.   Respiratory:  Negative for cough and shortness of breath.   Cardiovascular:  Negative for chest pain.  Gastrointestinal:  Negative for abdominal pain.  Musculoskeletal:  Negative for back pain.  Psychiatric/Behavioral:  Negative for behavioral problems and confusion.     Vital Signs: BP 96/75 (BP Location: Left Arm)   Pulse 80   Temp 97.8 F (36.6 C) (Oral)   Resp 12   Ht '5\' 8"'$  (1.727 m)   Wt (!) 381 lb 13.4 oz (173.2 kg)   SpO2 94%   BMI 58.06 kg/m   Physical Exam Vitals and nursing note reviewed.  Constitutional:      General: He is not in acute distress.    Appearance: Normal appearance. He is not ill-appearing.  HENT:     Mouth/Throat:     Mouth: Mucous membranes are moist.     Pharynx: Oropharynx is clear.  Cardiovascular:     Rate and Rhythm: Normal rate and regular rhythm.  Pulmonary:     Effort: Pulmonary effort is normal.  Skin:    General: Skin is warm and dry.     Comments: LLE stump red, warm to touch, non-tender.  Small scab present, intact.  No pus/drainage.   Neurological:     General: No focal deficit present.     Mental Status: He is alert and oriented to person, place, and time. Mental status is at baseline.  Psychiatric:        Mood and Affect: Mood normal.        Behavior: Behavior normal.  Imaging reviewed.   Labs:  COAGS: Recent Labs    01/29/21 1553 02/26/21 0310 04/13/21 0800 09/14/21 1235  INR 2.2* 1.3* 1.4* 1.5*  APTT  --   --   --  36    BMP: Recent Labs    09/14/21 1235 09/15/21 0711 09/16/21 0404 09/17/21 0417  NA 137 138 138 137  K 3.4* 4.1 4.5 4.3  CL 103 104 104 105  CO2 '27 26 28 28  '$ GLUCOSE 113* 101* 124* 109*  BUN '9 15 16 15  '$ CALCIUM 9.0 8.8* 8.7* 8.8*  CREATININE 1.14 1.00 1.00 1.01  GFRNONAA >60 >60 >60 >60       Electronically Signed: Docia Barrier, PA 09/17/2021, 12:37 PM   I spent a total of 15 minutes in face to face in clinical consultation, greater than 50% of which was  counseling/coordinating care for LLE cellulitis.

## 2021-09-17 NOTE — Procedures (Signed)
Interventional Radiology Procedure Note  Procedure:   Fluoro guided aspirate of fluid at the tip of left femoral amp stump.  ~2cc of dark bloody fluid aspirated.  .  Complications: None Recommendations:  - follow up culture   - Routine wound care   Signed,  Dulcy Fanny. Earleen Newport, DO

## 2021-09-17 NOTE — Consult Note (Signed)
[facility]                                                                                        Patient Identification: Patient Name: [name] MRN: [id number] [place] Date: 09/14/2021 10:13 AM Today's Date: 09/17/2021 Reason for consult: cellulitis/abscess  Requesting provider: Landis Gandy   Principal Problem:   Left leg cellulitis Active Problems:   Cellulitis   Persistent atrial fibrillation (HCC)   OSA (obstructive sleep apnea)-3 L of oxygen at night and not CPAP   Hypothyroidism   Macrocytic anemia   Morbid obesity with BMI of 60.0-69.9, adult ([place])   Hypokalemia   Chronic systolic CHF (congestive heart failure) ([place])   Fall at home, initial encounter   Unilateral AKA, left ([place])   Antibiotics:  Vancomycin 8/9- Ceftriaxone 8/7-c  Lines/Hardware: PIV   Assessment 71 Y O male with PMH of P A-fib, DVT, chronic diastolic CHF, hypothyroidism, morbid obesity/OSA, chronic hypoxic respiratory failure on 4 L nasal cannula, Left AKA who presented to ED 8/7 with fall at home.  CT left femur Chronic low-density collection abutting the resection margin of the distal femur measures approximately 5.6 x 2.1 x 5.3 cm, decreased in size from prior (previously measuring up to 7.8 cm).  Recommendations  Continue Vancomycin and ceftriaxone  Fu blood cultures  Orthopedics consult for possible drainage vs aspiration of fluid. Please send sample for aerobic and anaerobic cx Following   Rest of the management as per the primary team. Please call with questions or concerns.  Thank you for the consult  Rosiland Oz, MD Infectious Disease Physician [facility] [address] [place], [place] [address] Phone: [phone]  Fax:  [phone]  __________________________________________________________________________________________________________ HPI and Hospital Course: 72 Y O male with PMH of P A-fib, DVT, chronic diastolic CHF, hypothyroidism, morbid obesity/OSA, chronic hypoxic respiratory failure on 4 L nasal cannula, Left AKA who presented to ED 8/7 with fall at home.  He was trying to transfer from his bed to his WC and slipped and hit his head followed by headach, on Xarelto and hence called 911. Per patient 2 weeks ago, he had an abscess pop out in his left AKA stump which spontaneously drained while he was at the facility. Examined by NP over there, told to be unremarkable. Unsure if he had fevers and chills. Denies any significant pain and tenderness in the left AKA stump. Denies nausea, vomiting, abdominal pain and diarrhea. Quit smoking, denies alcohol and IVDU.   Denies any pain, swelling in the rt leg and rt knee Prosthetic knee.   At ED, left AKA was found to be erythematous with possible abscess.  Denies any drainage. Tmax 100.2.  WBC 8.2 8/7 x-ray, no evidence of osteomyelitis at the left AKA site 8/7 CT  1. Postsurgical changes from above-the-knee amputation of the left lower extremity. No evidence to suggest acute osteomyelitis by CT. 2. Chronic low-density collection abutting the resection margin of the distal femur measures approximately 5.6 x 2.1 x 5.3 cm, decreased in size from prior (previously measuring up to 7.8 cm). This  likely reflects a component of fibrous tissue and nonspecific fluid. No new focal fluid collections or soft tissue gas. 3. Mildly enlarged left inguinal lymph nodes, likely reactive.  ROS: all systems reviewed with pertinent positives and negatives as listed above   Past Medical History:  Diagnosis Date   CHF (congestive heart failure) ([place]) 05/2019   Chronic anticoagulation    with Xarelto   Complication of anesthesia    PT STATES HARD TO WAKE UP AFTER ONE SUGERY  -[place].  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   Shortness of breath    WITH EXERTION AND PAIN   Sleep apnea    uses CPAP,   Past Surgical History:  Procedure Laterality Date   2010 REMOVAL OF LEFT TOTAL KNEE     AMPUTATION Left 11/17/2019   Procedure: AMPUTATION ABOVE KNEE;  Surgeon: Newt Minion, MD;  Location: [facility];  Service: Orthopedics;  Laterality: Left;   CARDIAC CATHETERIZATION  2006   CARDIOVERSION N/A 08/28/2012   Procedure: CARDIOVERSION;  Surgeon: Sanda Klein, MD;  Location: [facility];  Service: Cardiovascular;  Laterality: N/A;   CARDIOVERSION N/A 09/01/2012   Procedure: CARDIOVERSION;  Surgeon: Pixie Casino, MD;  Location: [facility];  Service: Cardiovascular;  Laterality: N/A;   COLONOSCOPY N/A 06/13/2013   Procedure: COLONOSCOPY;  Surgeon: Inda Castle, MD;  [facility];  Service: Endoscopy;  Laterality: N/A;   EXCISIONAL TOTAL KNEE ARTHROPLASTY WITH ANTIBIOTIC SPACERS Left 09/21/2018   Procedure: Resection of tibia versus both components with placement of antibiotic spacer;  Surgeon: Paralee Cancel, MD;  Location: [facility];  Service: Orthopedics;  Laterality: Left;  2.5 hrs   EXCISIONAL TOTAL KNEE ARTHROPLASTY WITH ANTIBIOTIC SPACERS Left 01/02/2019   Procedure: Repeat Washout and placement of antibiotic spacer left knee;  Surgeon: Paralee Cancel, MD;  [facility]: [facility];  Service: Orthopedics;  Laterality: Left;  2 hrs   HYDROCELECTOY   2012   I & D EXTREMITY Left 08/14/2019   Procedure: IRRIGATION AND DEBRIDEMENT LEFT KNEE  WOUND;  Surgeon: Paralee Cancel, MD;  [facility];  Service: Orthopedics;  Laterality: Left;  60 mins   I & D KNEE WITH POLY EXCHANGE Left 05/17/2019   Procedure: IRRIGATION AND DEBRIDEMENT KNEE WITH POLY EXCHANGE;  Surgeon: Paralee Cancel, MD;  [facility];  Service: Orthopedics;  Laterality: Left;  90 mins   IRRIGATION AND DEBRIDEMENT KNEE Left 04/03/2019   Procedure: REIMPLANTATION OF LEFT KNEE TIBIAL COMPONENT.;  Surgeon: Paralee Cancel, MD;  [facility];  Service: Orthopedics;  Laterality: Left;  need 120 min   IRRIGATION AND DEBRIDEMENT KNEE Left 11/06/2019   Procedure: IRRIGATION AND DEBRIDEMENT KNEE;  Surgeon: Paralee Cancel, MD;  [facility];  Service: Orthopedics;  Laterality: Left;  90 mins   LEFT KNEE ARTHROSCOPY  1999   LEFT KNEE SURGERY UPPER TIBIAL OSTOMY     LEFT TOTAL KNEE REMOVAL FOR INFECTION  2006  REIMPLANTATION LEFT TOTAL KNEE  2008   REIMPLANTATION LEFT TOTAL KNEE   2006   REIMPLANTATION OF TOTAL KNEE Left 06/23/2012   Procedure: REIMPLANTATION OF LEFT TOTAL KNEE;  Surgeon: Mauri Pole, MD;  [facility];  Service: Orthopedics;  Laterality: Left;   REMOVAL LEFT TOTAL KNEE   2008   REPLACEMENT LEFT KNEE  2002   REPLACEMENT RIGHT KNEE  2003   REVISION LEFT KNEE CAP   2004   RIGHT KNEE ARTHROSCOPY  1998   TEE WITHOUT CARDIOVERSION N/A 08/28/2012   Procedure: TRANSESOPHAGEAL ECHOCARDIOGRAM (TEE);  Surgeon: Sanda Klein, MD;  Location: [facility];  Service: Cardiovascular;  Laterality: N/A;   TONSILLECTOMY     TOTAL KNEE REVISION Left 12/15/2015   Procedure: TOTAL KNEE REVISION REPLACEMENT;  Surgeon: Paralee Cancel, MD;  [facility];  Service: Orthopedics;  Laterality: Left;  Adductor Block     Scheduled Meds:  [START ON 09/18/2021] Chlorhexidine Gluconate Cloth  6 each Topical Daily   digoxin  0.125 mg Oral Daily   gabapentin  600 mg Oral TID   levothyroxine  137 mcg Oral QAC breakfast   metoprolol tartrate  25 mg Oral BID   mupirocin ointment  1  Application Nasal BID   rivaroxaban  20 mg Oral Q supper   sodium chloride flush  3 mL Intravenous Q12H   Continuous Infusions:  sodium chloride     cefTRIAXone (ROCEPHIN)  IV 2 g (09/16/21 1344)   vancomycin     PRN Meds:.sodium chloride, acetaminophen, HYDROcodone-acetaminophen, sodium chloride flush  Allergies  Allergen Reactions   Morphine Hives   Social History   Socioeconomic History   Marital status: Single    Spouse name: Not on file   Number of children: Not on file   Years of education: Not on file   Highest education level: Not on file  Occupational History   Not on file  Tobacco Use   Smoking status: Never   Smokeless tobacco: Never  Vaping Use   Vaping Use: Never used  Substance and Sexual Activity   Alcohol use: Not Currently    Comment: not since 1999. However, had around 6 drinks a day between 1993-1999, roughly.   Drug use: No   Sexual activity: Yes    Partners: Female  Other Topics Concern   Not on file  Social History Narrative   Not on file   Social Determinants of Health   Financial Resource Strain: Not on file  Food Insecurity: Not on file  Transportation Needs: Not on file  Physical Activity: Not on file  Stress: Not on file  Social Connections: Not on file  Intimate Partner Violence: Not on file   Family History  Problem Relation Age of Onset   Heart disease Father 12   Pancreatic cancer Sister    Liver cancer Sister    Cervical cancer Sister    Breast cancer Sister    Diabetes Sister    Colon cancer Neg Hx    Throat cancer Neg Hx    Stomach cancer Neg Hx    Kidney disease Neg Hx    Liver disease Neg Hx     Vitals BP 122/69 (BP Location: Left Arm)   Pulse 89   Temp 98.3 F (36.8 C) (Oral)   Resp 19   Ht '5\' 8"'$  (1.727 m)   Wt (!) 173.2 kg   SpO2 100%   BMI 58.06 kg/m    Physical Exam Constitutional:  Morbidly obese male lying  in the bed    Comments:   Cardiovascular:     Rate and Rhythm: Normal rate and  Irregular rhythm.     Heart sounds:  Pulmonary:     Effort: Pulmonary effort is normal on [place]    Comments:   Abdominal:     Palpations: Abdomen is soft.     Tenderness: obese abdomen and non tender   Musculoskeletal:        General: Rt Prosthetic knee, chronic scar in the rt lower leg with no swelling and tenderness. Left AKA stump with redness, mild warmth but no tenderness and fluctuance     Skin:    Comments: as above   Neurological:     General: awake, alert and oriented, spontaneously moves all extremities   Psychiatric:        Mood and Affect: Mood normal.    Pertinent Microbiology Results for orders placed or performed during the hospital encounter of 09/14/21  Urine Culture     Status: Abnormal   Collection Time: 09/14/21 11:21 AM   Specimen: In/Out Cath Urine  Result Value Ref Range Status   Specimen Description IN/OUT CATH URINE  Final   Special Requests   Final    NONE Performed at [facility], [place] [address], [place], [place] [address]    Culture MULTIPLE SPECIES PRESENT, SUGGEST RECOLLECTION (A)  Final   Report Status 09/15/2021 FINAL  Final  Blood culture (routine x 2)     Status: None (Preliminary result)   Collection Time: 09/14/21 12:55 PM   Specimen: BLOOD  Result Value Ref Range Status   Specimen Description BLOOD RIGHT FOREARM  Final   Special Requests   Final    BOTTLES DRAWN AEROBIC AND ANAEROBIC Blood Culture adequate volume   Culture   Final    NO GROWTH 3 DAYS Performed at [facility], [place] [address] [place], [place] [address]    Report Status PENDING  Incomplete  Blood culture (routine x 2)     Status: Abnormal   Collection Time: 09/14/21  2:42 PM   Specimen: BLOOD  Result Value Ref Range Status   Specimen Description BLOOD RIGHT ANTECUBITAL  Final   Special Requests   Final    BOTTLES DRAWN AEROBIC AND ANAEROBIC Blood Culture adequate volume   Culture  Setup Time   Final    GRAM POSITIVE RODS AEROBIC BOTTLE  ONLY CRITICAL RESULT CALLED TO, READ BACK BY AND VERIFIED WITH: J. FRENS PHARMD, AT 8546 09/15/21 D.VANHOOK Performed at [facility], [place] [address], [place], [place] [address]    Culture CORYNEBACTERIUM STRIATUM (A)  Final   Report Status 09/17/2021 FINAL  Final  MRSA Next Gen by PCR, Nasal     Status: Abnormal   Collection Time: 09/16/21  4:10 PM   Specimen: Nasal Mucosa; Nasal Swab  Result Value Ref Range Status   MRSA by PCR Next Gen DETECTED (A) NOT DETECTED Final    Comment: RESULT CALLED TO, READ BACK BY AND VERIFIED WITH: RN KELLY HILL ON 09/16/21 @ 1835 BY DRT (NOTE) The GeneXpert MRSA Assay (FDA approved for NASAL specimens only), is one component of a comprehensive MRSA colonization surveillance program. It is not intended to diagnose MRSA infection nor to guide or monitor treatment for MRSA infections. Test performance is not FDA approved in patients less than 47 years old. Performed at [facility], [place] [address], [place], [place] [address]      Pertinent  Lab seen by me:    Latest Ref Rng & Units 09/17/2021    4:17 AM 09/16/2021    4:04 AM 09/15/2021    7:11 AM  CBC  WBC 4.0 - 10.5 K/uL 3.2  4.6  7.9   Hemoglobin 13.0 - 17.0 g/dL 9.8  9.5  9.8   Hematocrit 39.0 - 52.0 % 32.0  31.5  32.2   Platelets 150 - 400 K/uL 205  199  235       Latest Ref Rng & Units 09/17/2021    4:17 AM 09/16/2021    4:04 AM 09/15/2021    7:11 AM  CMP  Glucose 70 - 99 mg/dL 109  124  101   BUN 8 - 23 mg/dL '15  16  15   '$ Creatinine 0.61 - 1.24 mg/dL 1.01  1.00  1.00   Sodium 135 - 145 mmol/L 137  138  138   Potassium 3.5 - 5.1 mmol/L 4.3  4.5  4.1   Chloride 98 - 111 mmol/L 105  104  104   CO2 22 - 32 mmol/L '28  28  26   '$ Calcium 8.9 - 10.3 mg/dL 8.8  8.7  8.8   Total Protein 6.5 - 8.1 g/dL 6.1     Total Bilirubin 0.3 - 1.2 mg/dL 0.4     Alkaline Phos 38 - 126 U/L 58     AST 15 - 41 U/L 21     ALT 0 - 44 U/L 17       Pertinent Imagings/Other Imagings Plain films and CT  images have been personally visualized and interpreted; radiology reports have been reviewed. Decision making incorporated into the Impression / Recommendations.  CT FEMUR LEFT W CONTRAST  Result Date: 09/17/2021 CLINICAL DATA:  Soft tissue infection suspected, thigh, xray done EXAM: CT OF THE LOWER LEFT EXTREMITY WITH CONTRAST TECHNIQUE: Multidetector CT imaging of the lower left extremity was performed according to the standard protocol following intravenous contrast administration. RADIATION DOSE REDUCTION: This exam was performed according to the departmental dose-optimization program which includes automated exposure control, adjustment of the mA and/or kV according to patient size and/or use of iterative reconstruction technique. CONTRAST:  142m OMNIPAQUE IOHEXOL 300 MG/ML  SOLN COMPARISON:  X-ray 09/14/2021, 08/14/2020.  CT 08/21/2020 FINDINGS: Bones/Joint/Cartilage Postsurgical changes from above knee amputation of the left lower extremity. Residual femoral stem hardware remains within the distal femoral metadiaphysis. Bony callus formation and heterotopic ossification along the posteromedial margin of the distal femoral resection margin. No evidence of bony erosion or aggressive periosteal elevation. No acute fracture or dislocation. Left hip joint intact. Ligaments Suboptimally assessed by CT. Muscles and Tendons Post amputation changes of the musculotendinous structures of the distal thigh with muscle atrophy. No intramuscular fluid collections are seen. Soft tissues Skin thickening and subcutaneous edema of the distal stump. Chronic low-density collection abutting the resection margin of the distal femur measures approximately 5.6 x 2.1 x 5.3 cm, decreased in size from prior (previously measuring up to 7.8 cm). This likely reflects a component of fibrous tissue and nonspecific fluid. No new focal fluid collections. No soft tissue gas. Mildly enlarged left inguinal lymph nodes are likely reactive.  IMPRESSION: 1. Postsurgical changes from above-the-knee amputation of the left lower extremity. No evidence to suggest acute osteomyelitis by CT. 2. Chronic low-density collection abutting the resection margin of the distal femur measures approximately 5.6 x 2.1 x 5.3 cm, decreased in size from prior (previously measuring up to 7.8 cm). This likely reflects  a component of fibrous tissue and nonspecific fluid. No new focal fluid collections or soft tissue gas. 3. Mildly enlarged left inguinal lymph nodes, likely reactive. Electronically Signed   By: Davina Poke D.O.   On: 09/17/2021 08:12   CT Head Wo Contrast  Result Date: 09/14/2021 CLINICAL DATA:  Status post fall with neck trauma. EXAM: CT HEAD WITHOUT CONTRAST CT CERVICAL SPINE WITHOUT CONTRAST TECHNIQUE: Multidetector CT imaging of the head and cervical spine was performed following the standard protocol without intravenous contrast. Multiplanar CT image reconstructions of the cervical spine were also generated. RADIATION DOSE REDUCTION: This exam was performed according to the departmental dose-optimization program which includes automated exposure control, adjustment of the mA and/or kV according to patient size and/or use of iterative reconstruction technique. COMPARISON:  CT of temporal bones September 07, 2021 FINDINGS: CT HEAD FINDINGS Brain: No evidence of acute infarction, hemorrhage, hydrocephalus, extra-axial collection or mass lesion/mass effect. Vascular: No hyperdense vessel is noted. Skull: Hyperostosis frontalis noted. Negative for fracture or focal lesion. Sinuses/Orbits: No acute finding. Other: None. CT CERVICAL SPINE FINDINGS Alignment: There is kyphosis of cervical spine. Skull base and vertebrae: No acute fracture. No primary bone lesion or focal pathologic process. Soft tissues and spinal canal: No prevertebral fluid or swelling. No visible canal hematoma. Disc levels: Severe degenerative joint changes of cervical spine with narrowed  joint space and osteophyte formation are identified throughout the cervical spine. Upper chest: Not included. Other: None. IMPRESSION: 1. No acute intracranial abnormality identified. 2. No acute fracture or subluxation of cervical spine. 3. 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 Electronically Signed   By: Abelardo Diesel M.D.   On: 09/14/2021 13:32   CT Cervical Spine Wo Contrast  Result Date: 09/14/2021 CLINICAL DATA:  Status post fall with neck trauma. EXAM: CT HEAD WITHOUT CONTRAST CT CERVICAL SPINE WITHOUT CONTRAST TECHNIQUE: Multidetector CT imaging of the head and cervical spine was performed following the standard protocol without intravenous contrast. Multiplanar CT image reconstructions of the cervical spine were also generated. RADIATION DOSE REDUCTION: This exam was performed according to the departmental dose-optimization program which includes automated exposure control, adjustment of the mA and/or kV according to patient size and/or use of iterative reconstruction technique. COMPARISON:  CT of temporal bones September 07, 2021 FINDINGS: CT HEAD FINDINGS Brain: No evidence of acute infarction, hemorrhage, hydrocephalus, extra-axial collection or mass lesion/mass effect. Vascular: No hyperdense vessel is noted. Skull: Hyperostosis frontalis noted. Negative for fracture or focal lesion. Sinuses/Orbits: No acute finding. Other: None. CT CERVICAL SPINE FINDINGS Alignment: There is kyphosis of cervical spine. Skull base and vertebrae: No acute fracture. No primary bone lesion or focal pathologic process. Soft tissues and spinal canal: No prevertebral fluid or swelling. No visible canal hematoma. 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 Upper chest: Not included. Other: None. IMPRESSION: 1. No acute intracranial abnormality identified. 2. No acute fracture or subluxation of  cervical spine. 3. 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 Electronically Signed   By: Abelardo Diesel M.D.   On: 09/14/2021 13:32   DG Femur Min 2 Views Left  Result Date: 09/14/2021 CLINICAL DATA:  Cellulitis of the left AKA stump. EXAM: LEFT FEMUR 2 VIEWS COMPARISON:  08/14/2020 FINDINGS: Patient is status post above the knee amputation. Intramedullary metallic stem device noted at the distal end of the femoral bone. No definite bony destruction or lysis to suggest underlying osteomyelitis.  IMPRESSION: Status post above-the-knee amputation. No evidence for bony lysis to suggest osteomyelitis. Electronically Signed   By: Misty Stanley M.D.   On: 09/14/2021 11:56   DG Shoulder Left  Result Date: 09/14/2021 CLINICAL DATA:  Pain after a fall EXAM: LEFT SHOULDER - 2+ VIEW COMPARISON:  None Available. FINDINGS: Visualized portion of the left hemithorax is normal. No acute fracture or dislocation. Advanced degenerative changes of the acromioclavicular and glenohumeral joints, including prominent humeral head osteophytes. IMPRESSION: Advanced degenerative change, without acute osseous finding. Electronically Signed   By: Abigail Miyamoto M.D.   On: 09/14/2021 11:05   DG Chest Port 1 View  Result Date: 09/14/2021 CLINICAL DATA:  Fall.  On blood thinners.  CHF. EXAM: PORTABLE CHEST 1 VIEW COMPARISON:  09/07/2021 CTA chest.  Plain film 09/07/2021. FINDINGS: Mildly degraded exam due to AP portable technique and patient body habitus. Apical lordotic positioning. Midline trachea. Borderline cardiomegaly. Atherosclerosis in the transverse aorta. No pleural effusion or pneumothorax. No congestive failure. Clear lungs. IMPRESSION: Borderline cardiomegaly and low lung volumes without acute disease. Aortic Atherosclerosis (ICD10-I70.0). Electronically Signed   By: Abigail Miyamoto M.D.   On: 09/14/2021 10:32   CT Temporal Bones Wo Contrast  Result Date: 09/07/2021 CLINICAL DATA:  Bilateral ear pain EXAM: CT TEMPORAL  BONES WITHOUT CONTRAST TECHNIQUE: Axial and coronal plane CT imaging of the petrous temporal bones was performed with thin-collimation image reconstruction. No intravenous contrast was administered. Multiplanar CT image reconstructions were also generated. RADIATION DOSE REDUCTION: This exam was performed according to the departmental dose-optimization program which includes automated exposure control, adjustment of the mA and/or kV according to patient size and/or use of iterative reconstruction technique. COMPARISON:  None Available. FINDINGS: RIGHT TEMPORAL BONE External auditory canal: Mild soft tissue thickening Middle ear cavity: Normally aerated. The scutum and ossicles are normal. The tegmen tympani is intact. Inner ear structures: The cochlea, vestibule and semicircular canals are normal. The vestibular aqueduct is not enlarged. Internal auditory and facial nerve canals:  Normal Mastoid air cells: Normally aerated. No osseous erosion. LEFT TEMPORAL BONE External auditory canal: Normal. Middle ear cavity: Normally aerated. The scutum and ossicles are normal. The tegmen tympani is intact. Inner ear structures: The cochlea, vestibule and semicircular canals are normal. The vestibular aqueduct is not enlarged. Internal auditory and facial nerve canals:  Normal. Mastoid air cells: Normally aerated. No osseous erosion. Vascular: Normal non-contrast appearance of the carotid canals, jugular bulbs and sigmoid plates. Limited intracranial:  No acute or significant finding. Visible orbits/paranasal sinuses: No acute or significant finding. Soft tissues: Normal. IMPRESSION: Mild soft tissue thickening of the right external auditory canal, which could indicate otitis externa. Otherwise normal temporal bones. No mastoid effusion. Electronically Signed   By: Ulyses Jarred M.D.   On: 09/07/2021 19:32   CT ABDOMEN PELVIS W CONTRAST  Result Date: 09/07/2021 CLINICAL DATA:  Abdominal pain EXAM: CT ABDOMEN AND PELVIS WITH  CONTRAST TECHNIQUE: Multidetector CT imaging of the abdomen and pelvis was performed using the standard protocol following bolus administration of intravenous contrast. RADIATION DOSE REDUCTION: This exam was performed according to the departmental dose-optimization program which includes automated exposure control, adjustment of the mA and/or kV according to patient size and/or use of iterative reconstruction technique. CONTRAST:  139m OMNIPAQUE IOHEXOL 350 MG/ML SOLN COMPARISON:  CT 04/13/2021 FINDINGS: Lower chest: Bibasilar atelectasis. Mild cardiomegaly. Coronary artery atherosclerotic calcification. Hepatobiliary: No suspicious focal liver abnormality is seen. No gallstones, gallbladder wall thickening, or biliary dilatation. Pancreas: Fatty atrophy of the  pancreas. Ductal dilation or surrounding inflammatory changes. Spleen: Normal in size without focal abnormality. Adrenals/Urinary Tract: Adrenal glands are unremarkable. Kidneys are normal, without renal calculi, suspicious focal lesion or hydronephrosis. Bladder is unremarkable. Stomach/Bowel: Stomach is within normal limits. Appendix appears normal. No evidence of bowel wall thickening, distention, or inflammatory changes. Vascular/Lymphatic: Aortic atherosclerosis. No enlarged abdominal or pelvic lymph nodes. Reproductive: Prostate is unremarkable. Other: Soft tissue swelling in the right groin/inguinal region is no longer visualized. Redemonstrated adjacent nodes with fatty, likely reactive. No abdominopelvic ascites or free intraperitoneal air. Musculoskeletal: Thoracolumbar spondylosis. No acute osseous abnormality. IMPRESSION: No acute abnormality in the abdomen or pelvis. Electronically Signed   By: Placido Sou M.D.   On: 09/07/2021 19:30   CT Angio Chest PE W and/or Wo Contrast  Result Date: 09/07/2021 CLINICAL DATA:  Diarrhea, shortness of breath, question pulmonary embolism EXAM: CT ANGIOGRAPHY CHEST WITH CONTRAST TECHNIQUE: Multidetector  CT imaging of the chest was performed using the standard protocol during bolus administration of intravenous contrast. Multiplanar CT image reconstructions and MIPs were obtained to evaluate the vascular anatomy. RADIATION DOSE REDUCTION: This exam was performed according to the departmental dose-optimization program which includes automated exposure control, adjustment of the mA and/or kV according to patient size and/or use of iterative reconstruction technique. CONTRAST:  175m OMNIPAQUE IOHEXOL 350 MG/ML SOLN IV COMPARISON:  08/26/2012 FINDINGS: Cardiovascular: Enlargement of cardiac chambers. Atherosclerotic calcifications aorta and coronary arteries. Aorta normal caliber. No pericardial effusion. Pulmonary arteries adequately opacified and patent. No evidence of pulmonary embolism. Mediastinum/Nodes: Few scattered normal sized thoracic lymph nodes. No adenopathy. Esophagus unremarkable. Lungs/Pleura: Subsegmental atelectasis BILATERAL lower lobes. Lungs otherwise clear. No pulmonary infiltrate, pleural effusion, or pneumothorax. Upper Abdomen: Visualized upper abdomen unremarkable Musculoskeletal: No acute osseous findings. Review of the MIP images confirms the above findings. IMPRESSION: No evidence of pulmonary embolism. Subsegmental atelectasis BILATERAL lower lobes. Mild enlargement of cardiac chambers. Aortic Atherosclerosis (ICD10-I70.0). Electronically Signed   By: MLavonia DanaM.D.   On: 09/07/2021 18:46   DG Chest Port 1 View  Result Date: 09/07/2021 CLINICAL DATA:  Recent history of CHF EXAM: PORTABLE CHEST 1 VIEW COMPARISON:  04/13/2021 FINDINGS: Transverse diameter heart is increased. There are no signs of pulmonary edema or focal pulmonary consolidation. There is no pleural effusion or pneumothorax. IMPRESSION: There are no signs of pulmonary edema or focal pulmonary consolidation. Electronically Signed   By: PElmer PickerM.D.   On: 09/07/2021 16:24     I spent 100 minutes for this  patient encounter including review of prior medical records/discussing diagnostics and treatment plan with the patient/family/coordinate care with primary/other specialits with greater than 50% of time in face to face encounter.   Electronically signed by:   SRosiland Oz MD Infectious Disease Physician [facility] Pager: [phone]

## 2021-09-17 NOTE — Progress Notes (Signed)
PROGRESS NOTE    Clayburn Weekly  DXA:[id number] DOB: [birth year] DOA: 09/14/2021 PCP: Bernerd Limbo, MD    Brief Narrative: 71 year old male with left above-knee amputation, morbid obesity, chronic persistent atrial fibrillation, hypothyroidism, obstructive sleep apnea on 3 L of oxygen no CPAP, chronic systolic heart failure admitted after a fall while he was trying to transfer himself from bed to wheelchair.  He is admitted with a diagnosis of left stump cellulitis.  He is on ceftriaxone vancomycin added 09/16/2021  Assessment & Plan:   Principal Problem:   Left leg cellulitis Active Problems:   Hypokalemia   Fall at home, initial encounter   Persistent atrial fibrillation ([place])   Morbid obesity with BMI of 60.0-69.9, adult (HCC)   Macrocytic anemia   Hypothyroidism   OSA (obstructive sleep apnea)-3 L of oxygen at night and not CPAP   Cellulitis   Chronic systolic CHF (congestive heart failure) ([place])   Unilateral AKA, left ([place])  #1 left stump cellulitis-patient has been started on ceftriaxone since admission.  However his stump remains extremely warm to touch and tender with no improvement in the last 3 days. Blood cultures positive for Corynebacterium stratum  MRSA PCR positive On vancomycin and Rocephin Consulted ID appreciate their input  CT of the left femur stump shows large fluid collection.  Consulted IR for aspiration and studies.  #2 chronic systolic heart failure stable  #3 orthostatic hypotension he has on multiple medications at home for hypertension including metoprolol Lasix furosemide torsemide digoxin Cardizem These are being held due to orthostasis. Metoprolol has been started for rate control.  However had to be held today due to soft blood pressure.  #4 persistent atrial fibrillation on digoxin Xarelto and Lopressor  #5 morbid obesity complicates overall prognosis BMI of 56.9  #6 obstructive sleep apnea not on CPAP  #7 hypothyroidism on  Synthroid   Pressure Injury 04/13/21 Thigh Posterior;Proximal;Right Stage 2 -  Partial thickness loss of dermis presenting as a shallow open injury with a red, pink wound bed without slough. (Active)  04/13/21 1700  Location: Thigh  Location Orientation: Posterior;Proximal;Right  Staging: Stage 2 -  Partial thickness loss of dermis presenting as a shallow open injury with a red, pink wound bed without slough.  Wound Description (Comments):   Present on Admission: Yes    Estimated body mass index is 58.06 kg/m as calculated from the following:   Height as of this encounter: '5\' 8"'$  (1.727 m).   Weight as of this encounter: 173.2 kg.  DVT prophylaxis: Xarelto  code Status: Full code Family Communication: None at bedside Disposition Plan:  Status is: Inpatient Remains inpatient appropriate because: Cellulitis   Consultants:  ID  Procedures: None Antimicrobials: Rocephin  Subjective: Patient complains of severe pain on the left stump still warm painful  Objective: Vitals:   09/17/21 0822 09/17/21 0940 09/17/21 0943 09/17/21 1130  BP: 1'02/63 98/71 98/71 '$ 96/75  Pulse: 96 97 97 80  Resp: '19  12 12  '$ Temp: 97.9 F (36.6 C)  98.4 F (36.9 C) 97.8 F (36.6 C)  TempSrc: Oral  Oral Oral  SpO2: 98%  100% 94%  Weight:      Height:        Intake/Output Summary (Last 24 hours) at 09/17/2021 1241 Last data filed at 09/17/2021 0940 Gross per 24 hour  Intake 1030 ml  Output 1200 ml  Net -170 ml    Filed Weights   09/14/21 1708 09/16/21 0521 09/17/21 0200  Weight: (!) 169.9 kg Marland Kitchen)  170.3 kg (!) 173.2 kg    Examination:  General exam: Appears in distress due to stump pain Respiratory system: Clear to auscultation. Respiratory effort normal. Cardiovascular system: S1 & S2 heard, RRR. No JVD, murmurs, rubs, gallops or clicks. No pedal edema. Gastrointestinal system: Abdomen is nondistended, soft and nontender. No organomegaly or masses felt. Normal bowel sounds heard. Central  nervous system: Alert and oriented. No focal neurological deficits. Extremities: Left stump warm tender erythematous skin: No rashes, lesions or ulcers Psychiatry: Judgement and insight appear normal. Mood & affect appropriate.     Data Reviewed: I have personally reviewed following labs and imaging studies  CBC: Recent Labs  Lab 09/14/21 1235 09/15/21 0711 09/16/21 0404 09/17/21 0417  WBC 8.2 7.9 4.6 3.2*  NEUTROABS 7.0  --   --   --   HGB 10.4* 9.8* 9.5* 9.8*  HCT 33.1* 32.2* 31.5* 32.0*  MCV 100.3* 102.2* 103.6* 102.9*  PLT 253 235 199 704    Basic Metabolic Panel: Recent Labs  Lab 09/14/21 1235 09/14/21 1442 09/15/21 0711 09/16/21 0404 09/17/21 0417  NA 137  --  138 138 137  K 3.4*  --  4.1 4.5 4.3  CL 103  --  104 104 105  CO2 27  --  '26 28 28  '$ GLUCOSE 113*  --  101* 124* 109*  BUN 9  --  '15 16 15  '$ CREATININE 1.14  --  1.00 1.00 1.01  CALCIUM 9.0  --  8.8* 8.7* 8.8*  MG  --  1.7  --   --   --     GFR: Estimated Creatinine Clearance: 106.2 mL/min (by C-G formula based on SCr of 1.01 mg/dL). Liver Function Tests: Recent Labs  Lab 09/14/21 1235 09/17/21 0417  AST 21 21  ALT 14 17  ALKPHOS 45 58  BILITOT 1.3* 0.4  PROT 6.4* 6.1*  ALBUMIN 2.7* 2.1*    No results for input(s): "LIPASE", "AMYLASE" in the last 168 hours. No results for input(s): "AMMONIA" in the last 168 hours. Coagulation Profile: Recent Labs  Lab 09/14/21 1235  INR 1.5*    Cardiac Enzymes: No results for input(s): "CKTOTAL", "CKMB", "CKMBINDEX", "TROPONINI" in the last 168 hours. BNP (last 3 results) No results for input(s): "PROBNP" in the last 8760 hours. HbA1C: Recent Labs    09/14/21 1442  HGBA1C 4.9    CBG: Recent Labs  Lab 09/16/21 0612 09/16/21 1135 09/16/21 1600 09/16/21 2114 09/17/21 1051  GLUCAP 98 107* 113* 102* 95    Lipid Profile: No results for input(s): "CHOL", "HDL", "LDLCALC", "TRIG", "CHOLHDL", "LDLDIRECT" in the last 72 hours. Thyroid  Function Tests: No results for input(s): "TSH", "T4TOTAL", "FREET4", "T3FREE", "THYROIDAB" in the last 72 hours. Anemia Panel: Recent Labs    09/14/21 1442  VITAMINB12 964*  FOLATE 19.0  FERRITIN 248  TIBC 252  IRON 22*    Sepsis Labs: Recent Labs  Lab 09/14/21 1235 09/14/21 1442  LATICACIDVEN 1.2 1.2     Recent Results (from the past 240 hour(s))  Resp Panel by RT-PCR (Flu A&B, Covid) Anterior Nasal Swab     Status: None   Collection Time: 09/07/21  3:49 PM   Specimen: Anterior Nasal Swab  Result Value Ref Range Status   SARS Coronavirus 2 by RT PCR NEGATIVE NEGATIVE Final    Comment: (NOTE) SARS-CoV-2 target nucleic acids are NOT DETECTED.  The SARS-CoV-2 RNA is generally detectable in upper respiratory specimens during the acute phase of infection. The lowest concentration of SARS-CoV-2  viral copies this assay can detect is 138 copies/mL. A negative result does not preclude SARS-Cov-2 infection and should not be used as the sole basis for treatment or other patient management decisions. A negative result may occur with  improper specimen collection/handling, submission of specimen other than nasopharyngeal swab, presence of viral mutation(s) within the areas targeted by this assay, and inadequate number of viral copies(<138 copies/mL). A negative result must be combined with clinical observations, patient history, and epidemiological information. The expected result is Negative.  Fact Sheet for Patients:  EntrepreneurPulse.com.au  Fact Sheet for Healthcare Providers:  IncredibleEmployment.be  This test is no t yet approved or cleared by the [place] FDA and  has been authorized for detection and/or diagnosis of SARS-CoV-2 by FDA under an Emergency Use Authorization (EUA). This EUA will remain  in effect (meaning this test can be used) for the duration of the COVID-19 declaration under Section 564(b)(1) of the Act,  21 U.S.C.section 360bbb-3(b)(1), unless the authorization is terminated  or revoked sooner.       Influenza A by PCR NEGATIVE NEGATIVE Final   Influenza B by PCR NEGATIVE NEGATIVE Final    Comment: (NOTE) The Xpert Xpress SARS-CoV-2/FLU/RSV plus assay is intended as an aid in the diagnosis of influenza from Nasopharyngeal swab specimens and should not be used as a sole basis for treatment. Nasal washings and aspirates are unacceptable for Xpert Xpress SARS-CoV-2/FLU/RSV testing.  Fact Sheet for Patients: EntrepreneurPulse.com.au  Fact Sheet for Healthcare Providers: IncredibleEmployment.be  This test is not yet approved or cleared by the [place] FDA and has been authorized for detection and/or diagnosis of SARS-CoV-2 by FDA under an Emergency Use Authorization (EUA). This EUA will remain in effect (meaning this test can be used) for the duration of the COVID-19 declaration under Section 564(b)(1) of the Act, 21 U.S.C. section 360bbb-3(b)(1), unless the authorization is terminated or revoked.  Performed at [facility], [place] [address] [place], [place] [address]   Urine Culture     Status: Abnormal   Collection Time: 09/14/21 11:21 AM   Specimen: In/Out Cath Urine  Result Value Ref Range Status   Specimen Description IN/OUT CATH URINE  Final   Special Requests   Final    NONE Performed at [facility], [place] [address] [place], [place] [address]    Culture MULTIPLE SPECIES PRESENT, SUGGEST RECOLLECTION (A)  Final   Report Status 09/15/2021 FINAL  Final  Blood culture (routine x 2)     Status: None (Preliminary result)   Collection Time: 09/14/21 12:55 PM   Specimen: BLOOD  Result Value Ref Range Status   Specimen Description BLOOD RIGHT FOREARM  Final   Special Requests   Final    BOTTLES DRAWN AEROBIC AND ANAEROBIC Blood Culture adequate volume   Culture   Final    NO GROWTH 3 DAYS Performed at [facility], [place] [address] [place],  [address]    Report Status PENDING  Incomplete  Blood culture (routine x 2)     Status: Abnormal   Collection Time: 09/14/21  2:42 PM   Specimen: BLOOD  Result Value Ref Range Status   Specimen Description BLOOD RIGHT ANTECUBITAL  Final   Special Requests   Final    BOTTLES DRAWN AEROBIC AND ANAEROBIC Blood Culture adequate volume   Culture  Setup Time   Final    GRAM POSITIVE RODS AEROBIC BOTTLE ONLY CRITICAL RESULT CALLED TO, READ BACK BY AND VERIFIED  WITH: J. FRENS PHARMD, AT 7342 09/15/21 D.VANHOOK Performed at [facility], [place] [address] [place], [place] [address]    Culture CORYNEBACTERIUM STRIATUM (A)  Final   Report Status 09/17/2021 FINAL  Final  MRSA Next Gen by PCR, Nasal     Status: Abnormal   Collection Time: 09/16/21  4:10 PM   Specimen: Nasal Mucosa; Nasal Swab  Result Value Ref Range Status   MRSA by PCR Next Gen DETECTED (A) NOT DETECTED Final    Comment: RESULT CALLED TO, READ BACK BY AND VERIFIED WITH: RN KELLY HILL ON 09/16/21 @ 1835 BY DRT (NOTE) The GeneXpert MRSA Assay (FDA approved for NASAL specimens only), is one component of a comprehensive MRSA colonization surveillance program. It is not intended to diagnose MRSA infection nor to guide or monitor treatment for MRSA infections. Test performance is not FDA approved in patients less than 100 years old. Performed at [facility], [place] [address], [place],  [address]          Radiology Studies: CT FEMUR LEFT W CONTRAST  Result Date: 09/17/2021 CLINICAL DATA:  Soft tissue infection suspected, thigh, xray done EXAM: CT OF THE LOWER LEFT EXTREMITY WITH CONTRAST TECHNIQUE: Multidetector CT imaging of the lower left extremity was performed according to the standard protocol following intravenous contrast administration. RADIATION DOSE REDUCTION: This exam was performed according to the departmental dose-optimization program which includes  automated exposure control, adjustment of the mA and/or kV according to patient size and/or use of iterative reconstruction technique. CONTRAST:  125m OMNIPAQUE IOHEXOL 300 MG/ML  SOLN COMPARISON:  X-ray 09/14/2021, 08/14/2020.  CT 08/21/2020 FINDINGS: Bones/Joint/Cartilage Postsurgical changes from above knee amputation of the left lower extremity. Residual femoral stem hardware remains within the distal femoral metadiaphysis. Bony callus formation and heterotopic ossification along the posteromedial margin of the distal femoral resection margin. No evidence of bony erosion or aggressive periosteal elevation. No acute fracture or dislocation. Left hip joint intact. Ligaments Suboptimally assessed by CT. Muscles and Tendons Post amputation changes of the musculotendinous structures of the distal thigh with muscle atrophy. No intramuscular fluid collections are seen. Soft tissues Skin thickening and subcutaneous edema of the distal stump. Chronic low-density collection abutting the resection margin of the distal femur measures approximately 5.6 x 2.1 x 5.3 cm, decreased in size from prior (previously measuring up to 7.8 cm). This likely reflects a component of fibrous tissue and nonspecific fluid. No new focal fluid collections. No soft tissue gas. Mildly enlarged left inguinal lymph nodes are likely reactive. IMPRESSION: 1. Postsurgical changes from above-the-knee amputation of the left lower extremity. No evidence to suggest acute osteomyelitis by CT. 2. Chronic low-density collection abutting the resection margin of the distal femur measures approximately 5.6 x 2.1 x 5.3 cm, decreased in size from prior (previously measuring up to 7.8 cm). This likely reflects a component of fibrous tissue and nonspecific fluid. No new focal fluid collections or soft tissue gas. 3. Mildly enlarged left inguinal lymph nodes, likely reactive. Electronically Signed   By: NDavina PokeD.O.   On: 09/17/2021 08:12         Scheduled Meds:  [START ON 09/18/2021] Chlorhexidine Gluconate Cloth  6 each Topical Daily   digoxin  0.125 mg Oral Daily   gabapentin  600 mg Oral TID   levothyroxine  137 mcg Oral QAC breakfast   metoprolol tartrate  25 mg Oral BID   mupirocin ointment  1 Application Nasal BID   rivaroxaban  20  mg Oral Q supper   sodium chloride flush  3 mL Intravenous Q12H   Continuous Infusions:  sodium chloride     cefTRIAXone (ROCEPHIN)  IV 2 g (09/16/21 1344)   vancomycin 1,250 mg (09/17/21 0940)     LOS: 2 days    Time spent: 35 min  Georgette Shell, MD 09/17/2021, 12:41 PM

## 2021-09-17 NOTE — TOC Progression Note (Signed)
Transition of Care [facility]) - Progression Note    Patient Details  Name: [name] MRN: [id number] Date of Birth: [birth year]  [facility] CM/SW [place], [place] Phone Number: 09/17/2021, 4:56 PM  Clinical Narrative:     CSW met with pt and provided SNF bed offers. Pt is interested in [facility]. CSW relayed financial details for [facility] to pt and discussed other SNF options and costs. Pt states he has decided he wants to return home. CSW discussed how pt will manage at home. Nurse tech was in room and expressed concern for him returning home. Pt states he uses a wheelchair at home and can transfer using a slide board. He states he was supposed to have a private paid aide to assist at home on weekends but that never started before he was admitted to hospital. CSW encouraged pt to arrange additional care during the week which patient said he could do. He reports he desires to gain more mobility and believes his mobility limitations are related to his current infection and believes he will improve. He reports he was supposed to start with [facility] PT/RN but ended up admitting to hospital. He believes [facility] PT/RN was with [facility] but is not completely sure.   Expected Discharge Plan: [place] Barriers to Discharge: Continued Medical Work up  Expected Discharge Plan and Services Expected Discharge Plan: [place] arrangements for the past 2 months: Apartment                                       Social Determinants of Health (SDOH) Interventions    Readmission Risk Interventions    11/09/2019    1:42 PM  Readmission Risk Prevention Plan  Transportation Screening Complete  PCP or Specialist Appt within 5-7 Days Complete  Home Care Screening Complete  Medication Review (RN CM) Complete

## 2021-09-17 NOTE — Progress Notes (Signed)
PT Cancellation Note  Patient Details Name: [name] MRN: [id number] DOB: January 07, 1951   Cancelled Treatment:    Reason Eval/Treat Not Completed: Patient at procedure or test/unavailable. Pt in IR.   Shary Decamp [facility] 09/17/2021, 3:42 PM [place] Office [phone]

## 2021-09-17 NOTE — TOC Progression Note (Addendum)
Transition of Care [facility]) - Progression Note    Patient Details  Name: [name] MRN: [id number] Date of Birth: 04-12-1950  [facility]) CM/SW Contact  Zenon Mayo, RN Phone Number: 09/17/2021, 4:57 PM  Clinical Narrative:    NCM received notification from [place] that patient would like to go home with [facility] , states he has had [facility] before and would like them again.  NCM made referral to [facility] with [facility], awaiting call back.  Jennifer with [facility] states he is not active with them, he may mean [facility],  NCM called Brandi with [facility], she states his name sounds familiar , she will confirm and call this NCM back.   Expected Discharge Plan: [place] Barriers to Discharge: Continued Medical Work up  Expected Discharge Plan and Services Expected Discharge Plan: [place] arrangements for the past 2 months: Apartment                                       Social Determinants of Health (SDOH) Interventions    Readmission Risk Interventions    11/09/2019    1:42 PM  Readmission Risk Prevention Plan  Transportation Screening Complete  PCP or Specialist Appt within 5-7 Days Complete  Home Care Screening Complete  Medication Review (RN CM) Complete

## 2021-09-18 ENCOUNTER — Other Ambulatory Visit (HOSPITAL_COMMUNITY): Payer: Self-pay

## 2021-09-18 ENCOUNTER — Telehealth (HOSPITAL_COMMUNITY): Payer: Self-pay | Admitting: Pharmacy Technician

## 2021-09-18 DIAGNOSIS — L03116 Cellulitis of left lower limb: Secondary | ICD-10-CM | POA: Diagnosis not present

## 2021-09-18 LAB — COMPREHENSIVE METABOLIC PANEL
ALT: 15 U/L (ref 0–44)
AST: 19 U/L (ref 15–41)
Albumin: 2 g/dL — ABNORMAL LOW (ref 3.5–5.0)
Alkaline Phosphatase: 53 U/L (ref 38–126)
Anion gap: 5 (ref 5–15)
BUN: 16 mg/dL (ref 8–23)
CO2: 30 mmol/L (ref 22–32)
Calcium: 8.9 mg/dL (ref 8.9–10.3)
Chloride: 105 mmol/L (ref 98–111)
Creatinine, Ser: 0.88 mg/dL (ref 0.61–1.24)
GFR, Estimated: >60 mL/min (ref >60)
Glucose, Bld: 115 mg/dL — ABNORMAL HIGH (ref 70–99)
Potassium: 4.4 mmol/L (ref 3.5–5.1)
Sodium: 140 mmol/L (ref 135–145)
Total Bilirubin: 0.3 mg/dL (ref 0.3–1.2)
Total Protein: 5.7 g/dL — ABNORMAL LOW (ref 6.5–8.1)

## 2021-09-18 LAB — GLUCOSE, CAPILLARY
Glucose-Capillary: 112 mg/dL — ABNORMAL HIGH (ref 70–99)
Glucose-Capillary: 115 mg/dL — ABNORMAL HIGH (ref 70–99)
Glucose-Capillary: 103 mg/dL — ABNORMAL HIGH (ref 70–99)
Glucose-Capillary: 106 mg/dL — ABNORMAL HIGH (ref 70–99)

## 2021-09-18 LAB — CBC
HCT: 30.5 % — ABNORMAL LOW (ref 39.0–52.0)
Hemoglobin: 8.9 g/dL — ABNORMAL LOW (ref 13.0–17.0)
MCH: 31.1 pg (ref 26.0–34.0)
MCHC: 29.2 g/dL — ABNORMAL LOW (ref 30.0–36.0)
MCV: 106.6 fL — ABNORMAL HIGH (ref 80.0–100.0)
Platelets: 278 10*3/uL (ref 150–400)
RBC: 2.86 MIL/uL — ABNORMAL LOW (ref 4.22–5.81)
RDW: 15.9 % — ABNORMAL HIGH (ref 11.5–15.5)
WBC: 2.4 10*3/uL — ABNORMAL LOW (ref 4.0–10.5)
nRBC: 0 % (ref 0.0–0.2)

## 2021-09-18 LAB — SEDIMENTATION RATE: Sed Rate: 130 mm/hr — ABNORMAL HIGH (ref 0–16)

## 2021-09-18 LAB — C-REACTIVE PROTEIN: CRP: 12 mg/dL — ABNORMAL HIGH (ref <1.0)

## 2021-09-18 NOTE — Progress Notes (Signed)
PROGRESS NOTE    [name]  OJJ:[id number] DOB: [birth year] DOA: 09/14/2021 PCP: Bernerd Limbo, MD    Brief Narrative: 71 year old male with left above-knee amputation, morbid obesity, chronic persistent atrial fibrillation, hypothyroidism, obstructive sleep apnea on 3 L of oxygen no CPAP, chronic systolic heart failure admitted after a fall while he was trying to transfer himself from bed to wheelchair.  He is admitted with a diagnosis of left stump cellulitis.  He is on ceftriaxone vancomycin added 09/16/2021  Assessment & Plan:   Principal Problem:   Left leg cellulitis Active Problems:   Hypokalemia   Fall at home, initial encounter   Persistent atrial fibrillation ([place])   Morbid obesity with BMI of 60.0-69.9, adult (HCC)   Macrocytic anemia   Hypothyroidism   OSA (obstructive sleep apnea)-3 L of oxygen at night and not CPAP   Cellulitis   Chronic systolic CHF (congestive heart failure) (HCC)   Unilateral AKA, left ([place])  #1 left stump cellulitis-slow improvement with Rocephin and vancomycin.  The stump is much less tender and less warmer compared to yesterday.   1 out of 4 blood culture with Corynebacterium stratum.  ID following.   CT of the left femur -chronic low-density collection 5.6 x 2.1 x 5.3 cm decreased in size compared to prior 7.8 cm.  He is status post aspiration 2 cc of dark bloody fluid aspirated by interventional radiology 09/17/2021. The fluid appears turbid with elevated WBC and neutrophils.  Culture negative pending so far   #2 chronic systolic heart failure stable  #3 orthostatic hypotension he has on multiple medications at home for hypertension including metoprolol Lasix furosemide torsemide digoxin Cardizem These are being held due to orthostasis. Metoprolol has been started for rate control.  However had to be held today due to soft blood pressure.  #4 persistent atrial fibrillation on digoxin Xarelto and Lopressor  #5 morbid obesity complicates  overall prognosis BMI of 56.9  #6 obstructive sleep apnea not on CPAP  #7 hypothyroidism on Synthroid   Pressure Injury 04/13/21 Thigh Posterior;Proximal;Right Stage 2 -  Partial thickness loss of dermis presenting as a shallow open injury with a red, pink wound bed without slough. (Active)  04/13/21 1700  Location: Thigh  Location Orientation: Posterior;Proximal;Right  Staging: Stage 2 -  Partial thickness loss of dermis presenting as a shallow open injury with a red, pink wound bed without slough.  Wound Description (Comments):   Present on Admission: Yes    Estimated body mass index is 60.84 kg/m as calculated from the following:   Height as of this encounter: '5\' 8"'$  (1.727 m).   Weight as of this encounter: 181.5 kg.  DVT prophylaxis: Xarelto  code Status: Full code Family Communication: None at bedside Disposition Plan:  Status is: Inpatient Remains inpatient appropriate because: Cellulitis   Consultants:  ID  Procedures: None Antimicrobials: Rocephin  Subjective: He is resting in bed He is still wanting to go home and give 1 more try  Objective: Vitals:   09/18/21 0332 09/18/21 0543 09/18/21 0733 09/18/21 1300  BP: 107/77 107/65 101/75 106/72  Pulse: 95 91 95 98  Resp: '20 14 19 18  '$ Temp: 98 F (36.7 C) 98 F (36.7 C) 98.1 F (36.7 C) 98.1 F (36.7 C)  TempSrc: Oral Oral Oral Oral  SpO2: 95% 99% 97% 96%  Weight:  (!) 181.5 kg    Height:        Intake/Output Summary (Last 24 hours) at 09/18/2021 1425 Last data filed at  09/18/2021 0908 Gross per 24 hour  Intake 390 ml  Output 1750 ml  Net -1360 ml    Filed Weights   09/16/21 0521 09/17/21 0200 09/18/21 0543  Weight: (!) 170.3 kg (!) 173.2 kg (!) 181.5 kg    Examination:  General exam: Appears in distress due to stump pain Respiratory system: Clear to auscultation. Respiratory effort normal. Cardiovascular system: S1 & S2 heard, RRR. No JVD, murmurs, rubs, gallops or clicks. No pedal  edema. Gastrointestinal system: Abdomen is nondistended, soft and nontender. No organomegaly or masses felt. Normal bowel sounds heard. Central nervous system: Alert and oriented. No focal neurological deficits. Extremities: Left stump is warm to touch less erythematous and less swollen skin: No rashes, lesions or ulcers Psychiatry: Judgement and insight appear normal. Mood & affect appropriate.     Data Reviewed: I have personally reviewed following labs and imaging studies  CBC: Recent Labs  Lab 09/14/21 1235 09/15/21 0711 09/16/21 0404 09/17/21 0417 09/18/21 0350  WBC 8.2 7.9 4.6 3.2* 2.4*  NEUTROABS 7.0  --   --   --   --   HGB 10.4* 9.8* 9.5* 9.8* 8.9*  HCT 33.1* 32.2* 31.5* 32.0* 30.5*  MCV 100.3* 102.2* 103.6* 102.9* 106.6*  PLT 253 235 199 205 629    Basic Metabolic Panel: Recent Labs  Lab 09/14/21 1235 09/14/21 1442 09/15/21 0711 09/16/21 0404 09/17/21 0417 09/18/21 0350  NA 137  --  138 138 137 140  K 3.4*  --  4.1 4.5 4.3 4.4  CL 103  --  104 104 105 105  CO2 27  --  '26 28 28 30  '$ GLUCOSE 113*  --  101* 124* 109* 115*  BUN 9  --  '15 16 15 16  '$ CREATININE 1.14  --  1.00 1.00 1.01 0.88  CALCIUM 9.0  --  8.8* 8.7* 8.8* 8.9  MG  --  1.7  --   --   --   --     GFR: Estimated Creatinine Clearance: 125.5 mL/min (by C-G formula based on SCr of 0.88 mg/dL). Liver Function Tests: Recent Labs  Lab 09/14/21 1235 09/17/21 0417 09/18/21 0350  AST '21 21 19  '$ ALT '14 17 15  '$ ALKPHOS 45 58 53  BILITOT 1.3* 0.4 0.3  PROT 6.4* 6.1* 5.7*  ALBUMIN 2.7* 2.1* 2.0*    No results for input(s): "LIPASE", "AMYLASE" in the last 168 hours. No results for input(s): "AMMONIA" in the last 168 hours. Coagulation Profile: Recent Labs  Lab 09/14/21 1235  INR 1.5*    Cardiac Enzymes: No results for input(s): "CKTOTAL", "CKMB", "CKMBINDEX", "TROPONINI" in the last 168 hours. BNP (last 3 results) No results for input(s): "PROBNP" in the last 8760 hours. HbA1C: No results  for input(s): "HGBA1C" in the last 72 hours.  CBG: Recent Labs  Lab 09/17/21 1051 09/17/21 1626 09/17/21 2157 09/18/21 0600 09/18/21 1106  GLUCAP 95 99 114* 112* 115*    Lipid Profile: No results for input(s): "CHOL", "HDL", "LDLCALC", "TRIG", "CHOLHDL", "LDLDIRECT" in the last 72 hours. Thyroid Function Tests: No results for input(s): "TSH", "T4TOTAL", "FREET4", "T3FREE", "THYROIDAB" in the last 72 hours. Anemia Panel: No results for input(s): "VITAMINB12", "FOLATE", "FERRITIN", "TIBC", "IRON", "RETICCTPCT" in the last 72 hours.  Sepsis Labs: Recent Labs  Lab 09/14/21 1235 09/14/21 1442  LATICACIDVEN 1.2 1.2     Recent Results (from the past 240 hour(s))  Urine Culture     Status: Abnormal   Collection Time: 09/14/21 11:21 AM   Specimen:  In/Out Cath Urine  Result Value Ref Range Status   Specimen Description IN/OUT CATH URINE  Final   Special Requests   Final    NONE Performed at [facility], [address] [place], [place] [address]    Culture MULTIPLE SPECIES PRESENT, SUGGEST RECOLLECTION (A)  Final   Report Status 09/15/2021 FINAL  Final  Blood culture (routine x 2)     Status: None (Preliminary result)   Collection Time: 09/14/21 12:55 PM   Specimen: BLOOD  Result Value Ref Range Status   Specimen Description BLOOD RIGHT FOREARM  Final   Special Requests   Final    BOTTLES DRAWN AEROBIC AND ANAEROBIC Blood Culture adequate volume   Culture   Final    NO GROWTH 4 DAYS Performed at [facility], [place] [address], [place], [place] [address]    Report Status PENDING  Incomplete  Blood culture (routine x 2)     Status: Abnormal   Collection Time: 09/14/21  2:42 PM   Specimen: BLOOD  Result Value Ref Range Status   Specimen Description BLOOD RIGHT ANTECUBITAL  Final   Special Requests   Final    BOTTLES DRAWN AEROBIC AND ANAEROBIC Blood Culture adequate volume   Culture  Setup Time   Final    GRAM POSITIVE RODS AEROBIC BOTTLE ONLY CRITICAL  RESULT CALLED TO, READ BACK BY AND VERIFIED WITH: J. FRENS PHARMD, AT 0932 09/15/21 D.VANHOOK Performed at [facility], [place] [address], [place], [place] [address]    Culture CORYNEBACTERIUM STRIATUM (A)  Final   Report Status 09/17/2021 FINAL  Final  MRSA Next Gen by PCR, Nasal     Status: Abnormal   Collection Time: 09/16/21  4:10 PM   Specimen: Nasal Mucosa; Nasal Swab  Result Value Ref Range Status   MRSA by PCR Next Gen DETECTED (A) NOT DETECTED Final    Comment: RESULT CALLED TO, READ BACK BY AND VERIFIED WITH: RN KELLY HILL ON 09/16/21 @ 1835 BY DRT (NOTE) The GeneXpert MRSA Assay (FDA approved for NASAL specimens only), is one component of a comprehensive MRSA colonization surveillance program. It is not intended to diagnose MRSA infection nor to guide or monitor treatment for MRSA infections. Test performance is not FDA approved in patients less than 33 years old. Performed at [facility], [place] [address] [place], [place] [address]   Aerobic/Anaerobic Culture w Gram Stain (surgical/deep wound)     Status: None (Preliminary result)   Collection Time: 09/17/21  2:25 PM   Specimen: Other Source; Abscess  Result Value Ref Range Status   Specimen Description OTHER  Final   Special Requests  LEFT LEG  Final   Gram Stain   Final    FEW WBC PRESENT, PREDOMINANTLY PMN NO ORGANISMS SEEN    Culture   Final    NO GROWTH < 24 HOURS Performed at [facility], [address] [place], [name] [address]    Report Status PENDING  Incomplete         Radiology Studies: IR US Guide Bx Asp/Drain  Result Date: 09/17/2021 INDICATION: 71 year old male with possible infection at site of above knee amputation EXAM: IMAGE GUIDED ASPIRATION OF SOFT TISSUE FLUID MEDICATIONS: None ANESTHESIA/SEDATION: None COMPLICATIONS: None PROCEDURE: Informed written consent was obtained from the patient after a thorough discussion of the procedural risks, benefits and alternatives. All  questions were addressed. Maximal Sterile Barrier Technique was utilized including caps, mask, sterile gowns, sterile  gloves, sterile drape, hand hygiene and skin antiseptic. A timeout was performed prior to the initiation of the procedure. The patient has amputation site was prepped and draped with chlorhexidine in the usual sterile fashion. 1% lidocaine was used for local anesthesia. Using fluoroscopic imaging, 18 gauge trocar needle was advanced to the tip of the amputation site, at the distal femoral stump/retained intramedullary rod fragment. This was at the site where there was fluid identified on recent CT. Approximately 2 cc of dark bloody fluid aspirated for a sample. Needle was removed and a bandage was applied. Patient tolerated the procedure well and remained hemodynamically stable throughout. No complications were encountered and no significant blood loss. IMPRESSION: Status post image guided soft tissue aspiration at the femoral stump site of above knee amputation with approximately 2 cc of fluid aspirated for culture. Signed, Dulcy Fanny. Nadene Rubins, RPVI [facility] Radiology Electronically Signed   By: Corrie Mckusick D.O.   On: 09/17/2021 15:28   CT FEMUR LEFT W CONTRAST  Result Date: 09/17/2021 CLINICAL DATA:  Soft tissue infection suspected, thigh, xray done EXAM: CT OF THE LOWER LEFT EXTREMITY WITH CONTRAST TECHNIQUE: Multidetector CT imaging of the lower left extremity was performed according to the standard protocol following intravenous contrast administration. RADIATION DOSE REDUCTION: This exam was performed according to the departmental dose-optimization program which includes automated exposure control, adjustment of the mA and/or kV according to patient size and/or use of iterative reconstruction technique. CONTRAST:  166m OMNIPAQUE IOHEXOL 300 MG/ML  SOLN COMPARISON:  X-ray 09/14/2021, 08/14/2020.  CT 08/21/2020 FINDINGS:  Bones/Joint/Cartilage Postsurgical changes from above knee amputation of the left lower extremity. Residual femoral stem hardware remains within the distal femoral metadiaphysis. Bony callus formation and heterotopic ossification along the posteromedial margin of the distal femoral resection margin. No evidence of bony erosion or aggressive periosteal elevation. No acute fracture or dislocation. Left hip joint intact. Ligaments Suboptimally assessed by CT. Muscles and Tendons Post amputation changes of the musculotendinous structures of the distal thigh with muscle atrophy. No intramuscular fluid collections are seen. Soft tissues Skin thickening and subcutaneous edema of the distal stump. Chronic low-density collection abutting the resection margin of the distal femur measures approximately 5.6 x 2.1 x 5.3 cm, decreased in size from prior (previously measuring up to 7.8 cm). This likely reflects a component of fibrous tissue and nonspecific fluid. No new focal fluid collections. No soft tissue gas. Mildly enlarged left inguinal lymph nodes are likely reactive. IMPRESSION: 1. Postsurgical changes from above-the-knee amputation of the left lower extremity. No evidence to suggest acute osteomyelitis by CT. 2. Chronic low-density collection abutting the resection margin of the distal femur measures approximately 5.6 x 2.1 x 5.3 cm, decreased in size from prior (previously measuring up to 7.8 cm). This likely reflects a component of fibrous tissue and nonspecific fluid. No new focal fluid collections or soft tissue gas. 3. Mildly enlarged left inguinal lymph nodes, likely reactive. Electronically Signed   By: NDavina PokeD.O.   On: 09/17/2021 08:12        Scheduled Meds:  Chlorhexidine Gluconate Cloth  6 each Topical Daily   digoxin  0.125 mg Oral Daily   gabapentin  600 mg Oral TID   levothyroxine  137 mcg Oral QAC breakfast   metoprolol tartrate  25 mg Oral BID   mupirocin ointment  1 Application  Nasal BID   rivaroxaban  20 mg Oral Q supper   sodium chloride flush  3  mL Intravenous Q12H   Continuous Infusions:  sodium chloride     cefTRIAXone (ROCEPHIN)  IV 2 g (09/18/21 0908)   vancomycin 1,250 mg (09/18/21 1022)     LOS: 3 days    Time spent: 35 min  Georgette Shell, MD 09/18/2021, 2:25 PM

## 2021-09-18 NOTE — Progress Notes (Signed)
Occupational Therapy Treatment Patient Details Name: [name] MRN: [id number] DOB: [birth year] Today's Date: 09/18/2021   History of present illness Pt adm with LLE cellulitis after fall at home. Pt recently left SNF against advice and has called the fire department 4 times to get him off of the floor since return home from SNF.  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   OT comments  Patient continues to make steady progress towards goals in skilled OT session. Patient's session encompassed  co-treat with PT to address increasing overall functional mobility and independence through skilled intervention. Patient with improved mobility in session with pattient using chair in front of him with BUEs on arm rests to initiate movement, with PT and OT blocking chair and providing support at trunk if needed. Patient able to complete 6x in session, with L residual limb supported on bed but able to clear hips each time and incrementally scoot toward [facility] on final attempt. Patient typically uses a slide board and wheelchair at baseline and feels more confident in his abilities (patient would also need to pay out of pocket for a facility at this time). OT updating recommendation to [facility] to promote increased activity tolerance and independence in the home setting.    Recommendations for follow up therapy are one component of a multi-disciplinary discharge planning process, led by the attending physician.  Recommendations may be updated based on patient status, additional functional criteria and insurance authorization.    Follow Up Recommendations  Home health OT    Assistance Recommended at Discharge Intermittent Supervision/Assistance  Patient can return home with the following  A lot of help with walking and/or transfers;A lot of help with  bathing/dressing/bathroom;Assistance with cooking/housework;Assist for transportation;Help with stairs or ramp for entrance   Equipment Recommendations  None recommended by OT    Recommendations for Other Services      Precautions / Restrictions Precautions Precautions: Fall Restrictions Weight Bearing Restrictions: No       Mobility Bed Mobility Overal bed mobility: Needs Assistance Bed Mobility: Rolling, Supine to Sit, Sit to Supine Rolling: Min guard   Supine to sit: Min guard Sit to supine: Min assist   General bed mobility comments: MIn gaurd to come into sitting with use of bed rail to initiate rolling, improved ability to move L residual limb in session, min assist needed (gaurding) to return back to bed    Transfers Overall transfer level: Needs assistance Equipment used: 2 person hand held assist Transfers: Sit to/from Stand Sit to Stand: Mod assist, +2 physical assistance, +2 safety/equipment           General transfer comment: Patient using chair in front of him with BUEs on arm rests to initiate movement, with PT and OT blocking chair and providing support at trunk if needed. Patient able to complete 6x in session, with L residual limb supported on bed but able to clear hips each time and incrementally scoot toward [facility] on final attempt     Balance Overall balance assessment: Needs assistance Sitting-balance support: No upper extremity supported, Feet supported Sitting balance-Leahy Scale: Good     Standing balance support: During functional activity, Reliant on assistive device for balance, Bilateral upper extremity supported Standing balance-Leahy Scale: Poor  ADL either performed or assessed with clinical judgement   ADL Overall ADL's : Needs assistance/impaired Eating/Feeding: Set up;Sitting   Grooming: Wash/dry hands;Wash/dry face;Set up;Sitting               Lower Body Dressing: Maximal  assistance;Bed level               Functional mobility during ADLs: Moderate assistance General ADL Comments: Patient with increased ability in session to date, focusing on increasing upper body strength and mobilty by working on sit<>stand transfers    Extremity/Trunk Assessment              Vision       Perception     Praxis      Cognition Arousal/Alertness: Awake/alert Behavior During Therapy: WFL for tasks assessed/performed Overall Cognitive Status: Within Functional Limits for tasks assessed                                          Exercises      Shoulder Instructions       General Comments      Pertinent Vitals/ Pain       Pain Assessment Pain Assessment: Faces Faces Pain Scale: Hurts a little bit Pain Location: L residual limb Pain Descriptors / Indicators: Guarding, Grimacing Pain Intervention(s): Limited activity within patient's tolerance, Monitored during session, Repositioned  Home Living                                          Prior Functioning/Environment              Frequency  Min 2X/week        Progress Toward Goals  OT Goals(current goals can now be found in the care plan section)  Progress towards OT goals: Progressing toward goals  Acute Rehab OT Goals Patient Stated Goal: to get back home OT Goal Formulation: With patient Time For Goal Achievement: 09/29/21 Potential to Achieve Goals: Good  Plan Discharge plan needs to be updated    Co-evaluation    PT/OT/SLP Co-Evaluation/Treatment: Yes Reason for Co-Treatment: For patient/therapist safety;To address functional/ADL transfers   OT goals addressed during session: ADL's and self-care;Strengthening/ROM      AM-PAC OT "6 Clicks" Daily Activity     Outcome Measure   Help from another person eating meals?: None Help from another person taking care of personal grooming?: A Little Help from another person toileting, which  includes using toliet, bedpan, or urinal?: A Lot Help from another person bathing (including washing, rinsing, drying)?: A Lot Help from another person to put on and taking off regular upper body clothing?: A Little Help from another person to put on and taking off regular lower body clothing?: A Lot 6 Click Score: 16    End of Session    OT Visit Diagnosis: Other abnormalities of gait and mobility (R26.89);Muscle weakness (generalized) (M62.81);History of falling (Z91.81);Repeated falls (R29.6);Pain Pain - Right/Left: Left Pain - part of body: Leg   Activity Tolerance Patient tolerated treatment well   Patient Left in bed;with call bell/phone within reach;with bed alarm set   Nurse Communication Mobility status        Time: 4132-4401 OT Time Calculation (min): 35 min  Charges: OT General Charges $OT Visit: 1 Visit OT Treatments $Self Care/Home  Management : 8-22 mins  Corinne Ports E. Eon Zunker, OTR/L Acute Rehabilitation Services [phone]   Ascencion Dike 09/18/2021, 3:19 PM

## 2021-09-18 NOTE — TOC Benefit Eligibility Note (Signed)
Patient Teacher, English as a foreign language completed.    The patient is currently admitted and upon discharge could be taking linezolid (Zyvox) 600 mg tablets.  The current 10 day co-pay is $24.54.   The patient is insured through [place] Patient Advocate Specialist [place] Patient Advocate Team Direct Number: [phone]  Fax: [phone]

## 2021-09-18 NOTE — Telephone Encounter (Signed)
Pharmacy Patient Advocate Encounter  Insurance verification completed.    The patient is insured through [employer] Part D   The patient is currently admitted and ran test claims for the following: linezolid (Zyvox) 600 mg tablets.  Copays and coinsurance results were relayed to Inpatient clinical team.

## 2021-09-18 NOTE — Progress Notes (Signed)
Physical Therapy Treatment Patient Details Name: [name] MRN: [id number] DOB: 1950-11-28 Today's Date: 09/18/2021   History of Present Illness Pt adm with LLE cellulitis after fall at home. Pt recently left SNF against advice and has called the fire department 4 times to get him off of the floor since return home from SNF.  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    PT Comments    Pt with improving mobility although it is hard to replicate his home set up here. Pt has decided he is going to go home again and try to manage. Pt aware of the risk of falls associated with this decision but wants to try it again. He already has equipment at home and he feels he can manage with his sliding board and w/c at home. Would maximize HH services at home.    Recommendations for follow up therapy are one component of a multi-disciplinary discharge planning process, led by the attending physician.  Recommendations may be updated based on patient status, additional functional criteria and insurance authorization.  Follow Up Recommendations  Home health PT (pt refusing facility care and is going to return home)     Assistance Recommended at Discharge Frequent or constant Supervision/Assistance  Patient can return home with the following Two people to help with walking and/or transfers;Assistance with cooking/housework   Equipment Recommendations  None recommended by PT    Recommendations for Other Services       Precautions / Restrictions Precautions Precautions: Fall Restrictions Weight Bearing Restrictions: No     Mobility  Bed Mobility Overal bed mobility: Needs Assistance Bed Mobility: Rolling, Supine to Sit, Sit to Supine Rolling: Min guard   Supine to sit: Min guard Sit to supine: Min guard   General bed mobility  comments: Assist for safety and incr time to perform.    Transfers Overall transfer level: Needs assistance Equipment used:  (bilateral armrests of chair in front of pt facing pt) Transfers: Sit to/from Stand Sit to Stand: Mod assist, +2 physical assistance           General transfer comment: Patient using chair in front of him with BUEs on arm rests to initiate movement, with PT and OT blocking chair and providing support at trunk if needed. Patient able to complete 6x in session, with L residual limb supported on bed but able to clear hips each time and incrementally scoot toward [facility] on final attempt. Pt also able to laterally scoot up toward head of bed 1' after finishing standing    Ambulation/Gait                   Stairs             Wheelchair Mobility    Modified Rankin (Stroke Patients Only)       Balance Overall balance assessment: Needs assistance Sitting-balance support: No upper extremity supported, Feet supported Sitting balance-Leahy Scale: Good     Standing balance support: During functional activity, Reliant on assistive device for balance, Bilateral upper extremity supported   Standing balance comment: Pt able to come into partial stand with +2 mod assist with lt residual limb supported on bed                            Cognition  Arousal/Alertness: Awake/alert Behavior During Therapy: WFL for tasks assessed/performed Overall Cognitive Status: Within Functional Limits for tasks assessed                                          Exercises      General Comments        Pertinent Vitals/Pain Pain Assessment Pain Assessment: Faces Faces Pain Scale: Hurts a little bit Pain Location: L residual limb Pain Descriptors / Indicators: Guarding, Grimacing Pain Intervention(s): Limited activity within patient's tolerance, Monitored during session, Repositioned    Home Living                           Prior Function            PT Goals (current goals can now be found in the care plan section) Progress towards PT goals: Progressing toward goals    Frequency    Min 3X/week      PT Plan Discharge plan needs to be updated;Frequency needs to be updated    Co-evaluation PT/OT/SLP Co-Evaluation/Treatment: Yes Reason for Co-Treatment: For patient/therapist safety PT goals addressed during session: Mobility/safety with mobility OT goals addressed during session: ADL's and self-care;Strengthening/ROM      AM-PAC PT "6 Clicks" Mobility   Outcome Measure  Help needed turning from your back to your side while in a flat bed without using bedrails?: A Little Help needed moving from lying on your back to sitting on the side of a flat bed without using bedrails?: A Little Help needed moving to and from a bed to a chair (including a wheelchair)?: Total Help needed standing up from a chair using your arms (e.g., wheelchair or bedside chair)?: Total Help needed to walk in hospital room?: Total Help needed climbing 3-5 steps with a railing? : Total 6 Click Score: 10    End of Session   Activity Tolerance: Patient tolerated treatment well Patient left: in bed;with call bell/phone within reach;with bed alarm set Nurse Communication: Mobility status PT Visit Diagnosis: Other abnormalities of gait and mobility (R26.89);Muscle weakness (generalized) (M62.81);History of falling (Z91.81)     Time: 0626-9485 PT Time Calculation (min) (ACUTE ONLY): 36 min  Charges:  $Therapeutic Activity: 8-22 mins                     [place] 09/18/2021, 5:22 PM

## 2021-09-18 NOTE — Progress Notes (Signed)
[facility] Infectious Diseases Follow Up Note  Patient Identification: Patient Name: [name] MRN: [id number] [place] Date: 09/14/2021 10:13 AM Age: 71 y.o.Today's Date: 09/18/2021  Reason for Visit: cellulitis/possible abscess   Principal Problem:   Left leg cellulitis Active Problems:   Cellulitis   Persistent atrial fibrillation (HCC)   OSA (obstructive sleep apnea)-3 L of oxygen at night and not CPAP   Hypothyroidism   Macrocytic anemia   Morbid obesity with BMI of 60.0-69.9, adult (HCC)   Hypokalemia   Chronic systolic CHF (congestive heart failure) ([place])   Fall at home, initial encounter   Unilateral AKA, left (HCC)  Antibiotics:  Vancomycin 8/9-c Ceftriaxone 8/7-c   Lines/Hardware: PIV   Interval Events: continues to be afebrile, no leukocytosis, s/p IR aspiration of left AKA stump fluid collection   Assessment 71 Y O male with PMH of P A-fib, DVT, chronic diastolic CHF, hypothyroidism, morbid obesity/OSA, chronic hypoxic respiratory failure on 4 L nasal cannula, Left AKA who presented to ED 8/7 with fall at home.  CT left femur Chronic low-density collection abutting the resection margin of the distal femur measures approximately 5.6 x 2.1 x 5.3 cm, decreased in size from prior (previously measuring up to 7.8 cm).  S/p IR aspiration of left AKA stump fluid collection. No organisms and NG in less than 24 hrs    8/7 blood cx 1/4 bottles - corynebacterium striatum, likely a contaminant, covered by Vancomycin however  Recommendations Continue Vancomycin, pharmacy to dose and ceftriaxone pending aspirate cultures Monitor left AKA stump for worsening redness/swelling Monitor CBC, CMP Final recommendations to follow pending cultures and improvement in cellulitis of Left AKA stump   Rest of the management as per the primary team. Thank you for the consult. Please page with pertinent questions or  concerns.  ______________________________________________________________________ Subjective patient seen and examined at the bedside.  Pain at the site of IR aspiration at the left AKA Pain at the left lateral thigh  Denies nausea, vomiting and diarrhea   Vitals BP 101/75 (BP Location: Left Arm)   Pulse 95   Temp 98.1 F (36.7 C) (Oral)   Resp 19   Ht '5\' 8"'$  (1.727 m)   Wt (!) 181.5 kg   SpO2 97%   BMI 60.84 kg/m     Physical Exam Constitutional:  Morbidly obese, lying in the bed     Comments:   Cardiovascular:     Rate and Rhythm: Normal rate and regular rhythm.     Heart sounds:  Pulmonary:     Effort: Pulmonary effort is normal on [place]    Comments:   Abdominal:     Palpations: Abdomen is soft.     Tenderness: non distended and non tender   Musculoskeletal:        General: left AKA stump with improving redness, mild induration in the left lateral thigh, no fluctuance and crepitus   Skin:    Comments:   Neurological:     General: grossly non focal, awake. Alert and oriented   Psychiatric:        Mood and Affect: Mood normal.   Pertinent Microbiology Results for orders placed or performed during the hospital encounter of 09/14/21  Urine Culture     Status: Abnormal   Collection Time: 09/14/21 11:21 AM   Specimen: In/Out Cath Urine  Result Value Ref Range Status   Specimen Description IN/OUT CATH URINE  Final   Special Requests   Final    NONE Performed at [facility]  [facility], [place] [address], [place], [place] [address]    Culture MULTIPLE SPECIES PRESENT, SUGGEST RECOLLECTION (A)  Final   Report Status 09/15/2021 FINAL  Final  Blood culture (routine x 2)     Status: None (Preliminary result)   Collection Time: 09/14/21 12:55 PM   Specimen: BLOOD  Result Value Ref Range Status   Specimen Description BLOOD RIGHT FOREARM  Final   Special Requests   Final    BOTTLES DRAWN AEROBIC AND ANAEROBIC Blood Culture adequate volume   Culture   Final    NO GROWTH  4 DAYS Performed at [facility], [place] [address] [place], [place] [address]    Report Status PENDING  Incomplete  Blood culture (routine x 2)     Status: Abnormal   Collection Time: 09/14/21  2:42 PM   Specimen: BLOOD  Result Value Ref Range Status   Specimen Description BLOOD RIGHT ANTECUBITAL  Final   Special Requests   Final    BOTTLES DRAWN AEROBIC AND ANAEROBIC Blood Culture adequate volume   Culture  Setup Time   Final    GRAM POSITIVE RODS AEROBIC BOTTLE ONLY CRITICAL RESULT CALLED TO, READ BACK BY AND VERIFIED WITH: J. FRENS PHARMD, AT 6734 09/15/21 D.VANHOOK Performed at [facility], [place] [address] [place], [place] [address]    Culture CORYNEBACTERIUM STRIATUM (A)  Final   Report Status 09/17/2021 FINAL  Final  MRSA Next Gen by PCR, Nasal     Status: Abnormal   Collection Time: 09/16/21  4:10 PM   Specimen: Nasal Mucosa; Nasal Swab  Result Value Ref Range Status   MRSA by PCR Next Gen DETECTED (A) NOT DETECTED Final    Comment: RESULT CALLED TO, READ BACK BY AND VERIFIED WITH: RN KELLY HILL ON 09/16/21 @ 1835 BY DRT (NOTE) The GeneXpert MRSA Assay (FDA approved for NASAL specimens only), is one component of a comprehensive MRSA colonization surveillance program. It is not intended to diagnose MRSA infection nor to guide or monitor treatment for MRSA infections. Test performance is not FDA approved in patients less than 72 years old. Performed at [facility], [place] [address], [place], [place] [address]   Aerobic/Anaerobic Culture w Gram Stain (surgical/deep wound)     Status: None (Preliminary result)   Collection Time: 09/17/21  2:25 PM   Specimen: Other Source; Abscess  Result Value Ref Range Status   Specimen Description OTHER  Final   Special Requests  LEFT LEG  Final   Gram Stain   Final    FEW WBC PRESENT, PREDOMINANTLY PMN NO ORGANISMS SEEN    Culture   Final    NO GROWTH < 24 HOURS Performed at [facility], [address],  [place], [place] [address]    Report Status PENDING  Incomplete   Pertinent Lab.    Latest Ref Rng & Units 09/18/2021    3:50 AM 09/17/2021    4:17 AM 09/16/2021    4:04 AM  CBC  WBC 4.0 - 10.5 K/uL 2.4  3.2  4.6   Hemoglobin 13.0 - 17.0 g/dL 8.9  9.8  9.5   Hematocrit 39.0 - 52.0 % 30.5  32.0  31.5   Platelets 150 - 400 K/uL 278  205  199       Latest Ref Rng & Units 09/18/2021    3:50 AM 09/17/2021    4:17 AM 09/16/2021    4:04 AM  CMP  Glucose 70 -  99 mg/dL 115  109  124   BUN 8 - 23 mg/dL '16  15  16   '$ Creatinine 0.61 - 1.24 mg/dL 0.88  1.01  1.00   Sodium 135 - 145 mmol/L 140  137  138   Potassium 3.5 - 5.1 mmol/L 4.4  4.3  4.5   Chloride 98 - 111 mmol/L 105  105  104   CO2 22 - 32 mmol/L '30  28  28   '$ Calcium 8.9 - 10.3 mg/dL 8.9  8.8  8.7   Total Protein 6.5 - 8.1 g/dL 5.7  6.1    Total Bilirubin 0.3 - 1.2 mg/dL 0.3  0.4    Alkaline Phos 38 - 126 U/L 53  58    AST 15 - 41 U/L 19  21    ALT 0 - 44 U/L 15  17       Pertinent Imaging today Plain films and CT images have been personally visualized and interpreted; radiology reports have been reviewed. Decision making incorporated into the Impression / Recommendations.  IR US Guide Bx Asp/Drain  Result Date: 09/17/2021 INDICATION: 71 year old male with possible infection at site of above knee amputation EXAM: IMAGE GUIDED ASPIRATION OF SOFT TISSUE FLUID MEDICATIONS: None ANESTHESIA/SEDATION: None COMPLICATIONS: None PROCEDURE: Informed written consent was obtained from the patient after a thorough discussion of the procedural risks, benefits and alternatives. All questions were addressed. Maximal Sterile Barrier Technique was utilized including caps, mask, sterile gowns, sterile gloves, sterile drape, hand hygiene and skin antiseptic. A timeout was performed prior to the initiation of the procedure. The patient has amputation site was prepped and draped with chlorhexidine in the usual sterile fashion. 1% lidocaine was used for local  anesthesia. Using fluoroscopic imaging, 18 gauge trocar needle was advanced to the tip of the amputation site, at the distal femoral stump/retained intramedullary rod fragment. This was at the site where there was fluid identified on recent CT. Approximately 2 cc of dark bloody fluid aspirated for a sample. Needle was removed and a bandage was applied. Patient tolerated the procedure well and remained hemodynamically stable throughout. No complications were encountered and no significant blood loss. IMPRESSION: Status post image guided soft tissue aspiration at the femoral stump site of above knee amputation with approximately 2 cc of fluid aspirated for culture. Signed, Dulcy Fanny. Nadene Rubins, RPVI [facility] Electronically Signed   By: Corrie Mckusick D.O.   On: 09/17/2021 15:28   CT FEMUR LEFT W CONTRAST  Result Date: 09/17/2021 CLINICAL DATA:  Soft tissue infection suspected, thigh, xray done EXAM: CT OF THE LOWER LEFT EXTREMITY WITH CONTRAST TECHNIQUE: Multidetector CT imaging of the lower left extremity was performed according to the standard protocol following intravenous contrast administration. RADIATION DOSE REDUCTION: This exam was performed according to the departmental dose-optimization program which includes automated exposure control, adjustment of the mA and/or kV according to patient size and/or use of iterative reconstruction technique. CONTRAST:  127m OMNIPAQUE IOHEXOL 300 MG/ML  SOLN COMPARISON:  X-ray 09/14/2021, 08/14/2020.  CT 08/21/2020 FINDINGS: Bones/Joint/Cartilage Postsurgical changes from above knee amputation of the left lower extremity. Residual femoral stem hardware remains within the distal femoral metadiaphysis. Bony callus formation and heterotopic ossification along the posteromedial margin of the distal femoral resection margin. No evidence of bony erosion or aggressive periosteal elevation. No acute fracture or  dislocation. Left hip joint intact. Ligaments Suboptimally assessed by CT. Muscles and Tendons Post amputation changes of the musculotendinous structures of  the distal thigh with muscle atrophy. No intramuscular fluid collections are seen. Soft tissues Skin thickening and subcutaneous edema of the distal stump. Chronic low-density collection abutting the resection margin of the distal femur measures approximately 5.6 x 2.1 x 5.3 cm, decreased in size from prior (previously measuring up to 7.8 cm). This likely reflects a component of fibrous tissue and nonspecific fluid. No new focal fluid collections. No soft tissue gas. Mildly enlarged left inguinal lymph nodes are likely reactive. IMPRESSION: 1. Postsurgical changes from above-the-knee amputation of the left lower extremity. No evidence to suggest acute osteomyelitis by CT. 2. Chronic low-density collection abutting the resection margin of the distal femur measures approximately 5.6 x 2.1 x 5.3 cm, decreased in size from prior (previously measuring up to 7.8 cm). This likely reflects a component of fibrous tissue and nonspecific fluid. No new focal fluid collections or soft tissue gas. 3. Mildly enlarged left inguinal lymph nodes, likely reactive. Electronically Signed   By: Davina Poke D.O.   On: 09/17/2021 08:12   CT Head Wo Contrast  Result Date: 09/14/2021 CLINICAL DATA:  Status post fall with neck trauma. EXAM: CT HEAD WITHOUT CONTRAST CT CERVICAL SPINE WITHOUT CONTRAST TECHNIQUE: Multidetector CT imaging of the head and cervical spine was performed following the standard protocol without intravenous contrast. Multiplanar CT image reconstructions of the cervical spine were also generated. RADIATION DOSE REDUCTION: This exam was performed according to the departmental dose-optimization program which includes automated exposure control, adjustment of the mA and/or kV according to patient size and/or use of iterative reconstruction technique. COMPARISON:   CT of temporal bones September 07, 2021 FINDINGS: CT HEAD FINDINGS Brain: No evidence of acute infarction, hemorrhage, hydrocephalus, extra-axial collection or mass lesion/mass effect. Vascular: No hyperdense vessel is noted. Skull: Hyperostosis frontalis noted. Negative for fracture or focal lesion. Sinuses/Orbits: No acute finding. Other: None. CT CERVICAL SPINE FINDINGS Alignment: There is kyphosis of cervical spine. Skull base and vertebrae: No acute fracture. No primary bone lesion or focal pathologic process. Soft tissues and spinal canal: No prevertebral fluid or swelling. No visible canal hematoma. 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 Upper chest: Not included. Other: None. IMPRESSION: 1. No acute intracranial abnormality identified. 2. No acute fracture or subluxation of cervical spine. 3. 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 Electronically Signed   By: Abelardo Diesel M.D.   On: 09/14/2021 13:32   CT Cervical Spine Wo Contrast  Result Date: 09/14/2021 CLINICAL DATA:  Status post fall with neck trauma. EXAM: CT HEAD WITHOUT CONTRAST CT CERVICAL SPINE WITHOUT CONTRAST TECHNIQUE: Multidetector CT imaging of the head and cervical spine was performed following the standard protocol without intravenous contrast. Multiplanar CT image reconstructions of the cervical spine were also generated. RADIATION DOSE REDUCTION: This exam was performed according to the departmental dose-optimization program which includes automated exposure control, adjustment of the mA and/or kV according to patient size and/or use of iterative reconstruction technique. COMPARISON:  CT of temporal bones September 07, 2021 FINDINGS: CT HEAD FINDINGS Brain: No evidence of acute infarction, hemorrhage, hydrocephalus, extra-axial collection or mass lesion/mass effect. Vascular: No hyperdense vessel is noted.  Skull: Hyperostosis frontalis noted. Negative for fracture or focal lesion. Sinuses/Orbits: No acute finding. Other: None. CT CERVICAL SPINE FINDINGS Alignment: There is kyphosis of cervical spine. Skull base and vertebrae: No acute fracture. No primary bone lesion or focal pathologic process. Soft tissues and spinal canal:  No prevertebral fluid or swelling. No visible canal hematoma. 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 Upper chest: Not included. Other: None. IMPRESSION: 1. No acute intracranial abnormality identified. 2. No acute fracture or subluxation of cervical spine. 3. 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 Electronically Signed   By: Abelardo Diesel M.D.   On: 09/14/2021 13:32   DG Femur Min 2 Views Left  Result Date: 09/14/2021 CLINICAL DATA:  Cellulitis of the left AKA stump. EXAM: LEFT FEMUR 2 VIEWS COMPARISON:  08/14/2020 FINDINGS: Patient is status post above the knee amputation. Intramedullary metallic stem device noted at the distal end of the femoral bone. No definite bony destruction or lysis to suggest underlying osteomyelitis. IMPRESSION: Status post above-the-knee amputation. No evidence for bony lysis to suggest osteomyelitis. Electronically Signed   By: Misty Stanley M.D.   On: 09/14/2021 11:56   DG Shoulder Left  Result Date: 09/14/2021 CLINICAL DATA:  Pain after a fall EXAM: LEFT SHOULDER - 2+ VIEW COMPARISON:  None Available. FINDINGS: Visualized portion of the left hemithorax is normal. No acute fracture or dislocation. Advanced degenerative changes of the acromioclavicular and glenohumeral joints, including prominent humeral head osteophytes. IMPRESSION: Advanced degenerative change, without acute osseous finding. Electronically Signed   By: Abigail Miyamoto M.D.   On: 09/14/2021 11:05   DG Chest Port 1 View  Result Date: 09/14/2021 CLINICAL DATA:   Fall.  On blood thinners.  CHF. EXAM: PORTABLE CHEST 1 VIEW COMPARISON:  09/07/2021 CTA chest.  Plain film 09/07/2021. FINDINGS: Mildly degraded exam due to AP portable technique and patient body habitus. Apical lordotic positioning. Midline trachea. Borderline cardiomegaly. Atherosclerosis in the transverse aorta. No pleural effusion or pneumothorax. No congestive failure. Clear lungs. IMPRESSION: Borderline cardiomegaly and low lung volumes without acute disease. Aortic Atherosclerosis (ICD10-I70.0). Electronically Signed   By: Abigail Miyamoto M.D.   On: 09/14/2021 10:32   CT Temporal Bones Wo Contrast  Result Date: 09/07/2021 CLINICAL DATA:  Bilateral ear pain EXAM: CT TEMPORAL BONES WITHOUT CONTRAST TECHNIQUE: Axial and coronal plane CT imaging of the petrous temporal bones was performed with thin-collimation image reconstruction. No intravenous contrast was administered. Multiplanar CT image reconstructions were also generated. RADIATION DOSE REDUCTION: This exam was performed according to the departmental dose-optimization program which includes automated exposure control, adjustment of the mA and/or kV according to patient size and/or use of iterative reconstruction technique. COMPARISON:  None Available. FINDINGS: RIGHT TEMPORAL BONE External auditory canal: Mild soft tissue thickening Middle ear cavity: Normally aerated. The scutum and ossicles are normal. The tegmen tympani is intact. Inner ear structures: The cochlea, vestibule and semicircular canals are normal. The vestibular aqueduct is not enlarged. Internal auditory and facial nerve canals:  Normal Mastoid air cells: Normally aerated. No osseous erosion. LEFT TEMPORAL BONE External auditory canal: Normal. Middle ear cavity: Normally aerated. The scutum and ossicles are normal. The tegmen tympani is intact. Inner ear structures: The cochlea, vestibule and semicircular canals are normal. The vestibular aqueduct is not enlarged. Internal auditory and  facial nerve canals:  Normal. Mastoid air cells: Normally aerated. No osseous erosion. Vascular: Normal non-contrast appearance of the carotid canals, jugular bulbs and sigmoid plates. Limited intracranial:  No acute or significant finding. Visible orbits/paranasal sinuses: No acute or significant finding. Soft tissues: Normal. IMPRESSION: Mild soft tissue thickening of the right external auditory canal, which could indicate otitis externa. Otherwise normal temporal bones. No mastoid effusion.  Electronically Signed   By: Ulyses Jarred M.D.   On: 09/07/2021 19:32   CT ABDOMEN PELVIS W CONTRAST  Result Date: 09/07/2021 CLINICAL DATA:  Abdominal pain EXAM: CT ABDOMEN AND PELVIS WITH CONTRAST TECHNIQUE: Multidetector CT imaging of the abdomen and pelvis was performed using the standard protocol following bolus administration of intravenous contrast. RADIATION DOSE REDUCTION: This exam was performed according to the departmental dose-optimization program which includes automated exposure control, adjustment of the mA and/or kV according to patient size and/or use of iterative reconstruction technique. CONTRAST:  145m OMNIPAQUE IOHEXOL 350 MG/ML SOLN COMPARISON:  CT 04/13/2021 FINDINGS: Lower chest: Bibasilar atelectasis. Mild cardiomegaly. Coronary artery atherosclerotic calcification. Hepatobiliary: No suspicious focal liver abnormality is seen. No gallstones, gallbladder wall thickening, or biliary dilatation. Pancreas: Fatty atrophy of the pancreas. Ductal dilation or surrounding inflammatory changes. Spleen: Normal in size without focal abnormality. Adrenals/Urinary Tract: Adrenal glands are unremarkable. Kidneys are normal, without renal calculi, suspicious focal lesion or hydronephrosis. Bladder is unremarkable. Stomach/Bowel: Stomach is within normal limits. Appendix appears normal. No evidence of bowel wall thickening, distention, or inflammatory changes. Vascular/Lymphatic: Aortic atherosclerosis. No  enlarged abdominal or pelvic lymph nodes. Reproductive: Prostate is unremarkable. Other: Soft tissue swelling in the right groin/inguinal region is no longer visualized. Redemonstrated adjacent nodes with fatty, likely reactive. No abdominopelvic ascites or free intraperitoneal air. Musculoskeletal: Thoracolumbar spondylosis. No acute osseous abnormality. IMPRESSION: No acute abnormality in the abdomen or pelvis. Electronically Signed   By: TPlacido SouM.D.   On: 09/07/2021 19:30   CT Angio Chest PE W and/or Wo Contrast  Result Date: 09/07/2021 CLINICAL DATA:  Diarrhea, shortness of breath, question pulmonary embolism EXAM: CT ANGIOGRAPHY CHEST WITH CONTRAST TECHNIQUE: Multidetector CT imaging of the chest was performed using the standard protocol during bolus administration of intravenous contrast. Multiplanar CT image reconstructions and MIPs were obtained to evaluate the vascular anatomy. RADIATION DOSE REDUCTION: This exam was performed according to the departmental dose-optimization program which includes automated exposure control, adjustment of the mA and/or kV according to patient size and/or use of iterative reconstruction technique. CONTRAST:  1067mOMNIPAQUE IOHEXOL 350 MG/ML SOLN IV COMPARISON:  08/26/2012 FINDINGS: Cardiovascular: Enlargement of cardiac chambers. Atherosclerotic calcifications aorta and coronary arteries. Aorta normal caliber. No pericardial effusion. Pulmonary arteries adequately opacified and patent. No evidence of pulmonary embolism. Mediastinum/Nodes: Few scattered normal sized thoracic lymph nodes. No adenopathy. Esophagus unremarkable. Lungs/Pleura: Subsegmental atelectasis BILATERAL lower lobes. Lungs otherwise clear. No pulmonary infiltrate, pleural effusion, or pneumothorax. Upper Abdomen: Visualized upper abdomen unremarkable Musculoskeletal: No acute osseous findings. Review of the MIP images confirms the above findings. IMPRESSION: No evidence of pulmonary embolism.  Subsegmental atelectasis BILATERAL lower lobes. Mild enlargement of cardiac chambers. Aortic Atherosclerosis (ICD10-I70.0). Electronically Signed   By: MaLavonia Dana.D.   On: 09/07/2021 18:46   DG Chest Port 1 View  Result Date: 09/07/2021 CLINICAL DATA:  Recent history of CHF EXAM: PORTABLE CHEST 1 VIEW COMPARISON:  04/13/2021 FINDINGS: Transverse diameter heart is increased. There are no signs of pulmonary edema or focal pulmonary consolidation. There is no pleural effusion or pneumothorax. IMPRESSION: There are no signs of pulmonary edema or focal pulmonary consolidation. Electronically Signed   By: PaElmer Picker.D.   On: 09/07/2021 16:24     I spent 51 minutes for this patient encounter including review of prior medical records, coordination of care with primary/other specialist with greater than 50% of time being face to face/counseling and discussing diagnostics/treatment plan with the patient/family.  Electronically signed  by:   Rosiland Oz, MD Infectious Disease Physician [facility] Pager: [phone]

## 2021-09-18 NOTE — Care Management Important Message (Signed)
Important Message  Patient Details  Name: Devario Bucklew MRN: [id number] Date of Birth: 1950/03/24   Medicare Important Message Given:  Yes     Shelda Altes 09/18/2021, 8:00 AM

## 2021-09-18 NOTE — TOC Progression Note (Signed)
Transition of Care [facility]) - Progression Note    Patient Details  Name: [name] MRN: [id number] Date of Birth: [birth year]  Transition of Care [facility]) CM/SW Contact  Zenon Mayo, RN Phone Number: 09/18/2021, 5:20 PM  Clinical Narrative:    Patient is wanting to go home, NCM made referral to [facility] with [name], for [facility], HHPT, HHAIDE and Social Worker, he states he is able to take referral. Soc will begin on Monday.  TOC following.    Expected Discharge Plan: [place] Barriers to Discharge: Continued Medical Work up  Expected Discharge Plan and Services Expected Discharge Plan: [place]   Discharge Planning Services: CM Consult Post Acute Care Choice: [place] arrangements for the past 2 months: Apartment                   DME Agency: NA       HH Arranged: RN, Disease Management, PT, Nurse's Aide, Social Work [employer] Agency: [place] Date [facility] Agency Contacted: 09/18/21 Time [place]: 1719 Representative spoke with at [place]: [place] ([place]) Interventions    Readmission Risk Interventions    11/09/2019    1:42 PM  Readmission Risk Prevention Plan  Transportation Screening Complete  PCP or Specialist Appt within 5-7 Days Complete  Home Care Screening Complete  Medication Review (RN CM) Complete

## 2021-09-19 DIAGNOSIS — L03116 Cellulitis of left lower limb: Secondary | ICD-10-CM | POA: Diagnosis not present

## 2021-09-19 LAB — GLUCOSE, CAPILLARY
Glucose-Capillary: 131 mg/dL — ABNORMAL HIGH (ref 70–99)
Glucose-Capillary: 103 mg/dL — ABNORMAL HIGH (ref 70–99)
Glucose-Capillary: 140 mg/dL — ABNORMAL HIGH (ref 70–99)
Glucose-Capillary: 109 mg/dL — ABNORMAL HIGH (ref 70–99)

## 2021-09-19 LAB — COMPREHENSIVE METABOLIC PANEL
ALT: 16 U/L (ref 0–44)
AST: 16 U/L (ref 15–41)
Albumin: 2.1 g/dL — ABNORMAL LOW (ref 3.5–5.0)
Alkaline Phosphatase: 52 U/L (ref 38–126)
Anion gap: 5 (ref 5–15)
BUN: 12 mg/dL (ref 8–23)
CO2: 33 mmol/L — ABNORMAL HIGH (ref 22–32)
Calcium: 9.1 mg/dL (ref 8.9–10.3)
Chloride: 105 mmol/L (ref 98–111)
Creatinine, Ser: 0.87 mg/dL (ref 0.61–1.24)
GFR, Estimated: >60 mL/min (ref >60)
Glucose, Bld: 108 mg/dL — ABNORMAL HIGH (ref 70–99)
Potassium: 4.4 mmol/L (ref 3.5–5.1)
Sodium: 143 mmol/L (ref 135–145)
Total Bilirubin: 0.4 mg/dL (ref 0.3–1.2)
Total Protein: 5.8 g/dL — ABNORMAL LOW (ref 6.5–8.1)

## 2021-09-19 LAB — CBC
HCT: 31.3 % — ABNORMAL LOW (ref 39.0–52.0)
Hemoglobin: 9.4 g/dL — ABNORMAL LOW (ref 13.0–17.0)
MCH: 31.4 pg (ref 26.0–34.0)
MCHC: 30 g/dL (ref 30.0–36.0)
MCV: 104.7 fL — ABNORMAL HIGH (ref 80.0–100.0)
Platelets: 229 10*3/uL (ref 150–400)
RBC: 2.99 MIL/uL — ABNORMAL LOW (ref 4.22–5.81)
RDW: 15.9 % — ABNORMAL HIGH (ref 11.5–15.5)
WBC: 2.8 10*3/uL — ABNORMAL LOW (ref 4.0–10.5)
nRBC: 0 % (ref 0.0–0.2)

## 2021-09-19 LAB — CULTURE, BLOOD (ROUTINE X 2)
Culture: NO GROWTH
Special Requests: ADEQUATE

## 2021-09-19 LAB — C-REACTIVE PROTEIN: CRP: 6.4 mg/dL — ABNORMAL HIGH (ref <1.0)

## 2021-09-19 LAB — SEDIMENTATION RATE: Sed Rate: 126 mm/hr — ABNORMAL HIGH (ref 0–16)

## 2021-09-19 NOTE — Progress Notes (Signed)
PROGRESS NOTE    [name]  YWV:[id number] DOB: July 29, 1950 DOA: 09/14/2021 PCP: Bernerd Limbo, MD    Brief Narrative: 71 year old male with left above-knee amputation, morbid obesity, chronic persistent atrial fibrillation, hypothyroidism, obstructive sleep apnea on 3 L of oxygen no CPAP, chronic systolic heart failure admitted after a fall while he was trying to transfer himself from bed to wheelchair.  He is admitted with a diagnosis of left stump cellulitis.  He is on ceftriaxone vancomycin added 09/16/2021  Assessment & Plan:   Principal Problem:   Left leg cellulitis Active Problems:   Hypokalemia   Fall at home, initial encounter   Persistent atrial fibrillation ([place])   Morbid obesity with BMI of 60.0-69.9, adult (HCC)   Macrocytic anemia   Hypothyroidism   OSA (obstructive sleep apnea)-3 L of oxygen at night and not CPAP   Cellulitis   Chronic systolic CHF (congestive heart failure) (HCC)   Unilateral AKA, left ([place])  #1 left stump cellulitis-slow improvement with Rocephin and vancomycin.  The stump is much less tender and less warmer compared to yesterday.  Overall improved compared to yesterday. 1 out of 4 blood culture with Corynebacterium stratum.  ID following.   CT of the left femur -chronic low-density collection 5.6 x 2.1 x 5.3 cm decreased in size compared to prior 7.8 cm.  He is status post aspiration 2 cc of dark bloody fluid aspirated by interventional radiology 09/17/2021. The fluid appears turbid with elevated WBC and neutrophils.  Culture negative pending so far CRP trending down. Sed rate still elevated though it could be elevated due to obesity   #2 chronic systolic heart failure stable  #3 orthostatic hypotension he has on multiple medications at home for hypertension including metoprolol Lasix furosemide torsemide digoxin Cardizem These are being held due to orthostasis. Metoprolol has been started for rate control.  However had to be held today due  to soft blood pressure.  #4 persistent atrial fibrillation on digoxin Xarelto and Lopressor  #5 morbid obesity complicates overall prognosis BMI of 56.9  #6 obstructive sleep apnea not on CPAP  #7 hypothyroidism on Synthroid   Pressure Injury 04/13/21 Thigh Posterior;Proximal;Right Stage 2 -  Partial thickness loss of dermis presenting as a shallow open injury with a red, pink wound bed without slough. (Active)  04/13/21 1700  Location: Thigh  Location Orientation: Posterior;Proximal;Right  Staging: Stage 2 -  Partial thickness loss of dermis presenting as a shallow open injury with a red, pink wound bed without slough.  Wound Description (Comments):   Present on Admission: Yes    Estimated body mass index is 60.84 kg/m as calculated from the following:   Height as of this encounter: '5\' 8"'$  (1.727 m).   Weight as of this encounter: 181.5 kg.  DVT prophylaxis: Xarelto  code Status: Full code Family Communication: None at bedside Disposition Plan:  Status is: Inpatient Remains inpatient appropriate because: Cellulitis   Consultants:  ID  Procedures: None Antimicrobials: Rocephin  Subjective: Patient resting in bed in no acute distress denies any events overnight the left stump continues to be painful which is kind of expected Objective: Vitals:   09/18/21 2343 09/19/21 0343 09/19/21 0900 09/19/21 1100  BP: (!) 100/57 124/71 105/70 109/79  Pulse: 94 92 84 72  Resp: '18 16 17 17  '$ Temp: 98 F (36.7 C) 97.7 F (36.5 C) 98 F (36.7 C) 98.1 F (36.7 C)  TempSrc: Oral Oral Oral Oral  SpO2: 95% 97% 96% 97%  Weight:  Height:        Intake/Output Summary (Last 24 hours) at 09/19/2021 1424 Last data filed at 09/19/2021 0900 Gross per 24 hour  Intake 1283.23 ml  Output 3750 ml  Net -2466.77 ml    Filed Weights   09/16/21 0521 09/17/21 0200 09/18/21 0543  Weight: (!) 170.3 kg (!) 173.2 kg (!) 181.5 kg    Examination:  General exam: Appears in distress due to  stump pain Respiratory system: Clear to auscultation. Respiratory effort normal. Cardiovascular system: S1 & S2 heard, RRR. No JVD, murmurs, rubs, gallops or clicks. No pedal edema. Gastrointestinal system: Abdomen is nondistended, soft and nontender. No organomegaly or masses felt. Normal bowel sounds heard. Central nervous system: Alert and oriented. No focal neurological deficits. Extremities: Left stump is warm to touch less erythematous and less swollen Psychiatry: Judgement and insight appear normal. Mood & affect appropriate.     Data Reviewed: I have personally reviewed following labs and imaging studies  CBC: Recent Labs  Lab 09/14/21 1235 09/15/21 0711 09/16/21 0404 09/17/21 0417 09/18/21 0350 09/19/21 0313  WBC 8.2 7.9 4.6 3.2* 2.4* 2.8*  NEUTROABS 7.0  --   --   --   --   --   HGB 10.4* 9.8* 9.5* 9.8* 8.9* 9.4*  HCT 33.1* 32.2* 31.5* 32.0* 30.5* 31.3*  MCV 100.3* 102.2* 103.6* 102.9* 106.6* 104.7*  PLT 253 235 199 205 278 540    Basic Metabolic Panel: Recent Labs  Lab 09/14/21 1442 09/15/21 0711 09/16/21 0404 09/17/21 0417 09/18/21 0350 09/19/21 0313  NA  --  138 138 137 140 143  K  --  4.1 4.5 4.3 4.4 4.4  CL  --  104 104 105 105 105  CO2  --  '26 28 28 30 '$ 33*  GLUCOSE  --  101* 124* 109* 115* 108*  BUN  --  '15 16 15 16 12  '$ CREATININE  --  1.00 1.00 1.01 0.88 0.87  CALCIUM  --  8.8* 8.7* 8.8* 8.9 9.1  MG 1.7  --   --   --   --   --     GFR: Estimated Creatinine Clearance: 126.9 mL/min (by C-G formula based on SCr of 0.87 mg/dL). Liver Function Tests: Recent Labs  Lab 09/14/21 1235 09/17/21 0417 09/18/21 0350 09/19/21 0313  AST '21 21 19 16  '$ ALT '14 17 15 16  '$ ALKPHOS 45 58 53 52  BILITOT 1.3* 0.4 0.3 0.4  PROT 6.4* 6.1* 5.7* 5.8*  ALBUMIN 2.7* 2.1* 2.0* 2.1*    No results for input(s): "LIPASE", "AMYLASE" in the last 168 hours. No results for input(s): "AMMONIA" in the last 168 hours. Coagulation Profile: Recent Labs  Lab 09/14/21 1235   INR 1.5*    Cardiac Enzymes: No results for input(s): "CKTOTAL", "CKMB", "CKMBINDEX", "TROPONINI" in the last 168 hours. BNP (last 3 results) No results for input(s): "PROBNP" in the last 8760 hours. HbA1C: No results for input(s): "HGBA1C" in the last 72 hours.  CBG: Recent Labs  Lab 09/18/21 1106 09/18/21 1642 09/18/21 2109 09/19/21 0602 09/19/21 1108  GLUCAP 115* 103* 106* 131* 103*    Lipid Profile: No results for input(s): "CHOL", "HDL", "LDLCALC", "TRIG", "CHOLHDL", "LDLDIRECT" in the last 72 hours. Thyroid Function Tests: No results for input(s): "TSH", "T4TOTAL", "FREET4", "T3FREE", "THYROIDAB" in the last 72 hours. Anemia Panel: No results for input(s): "VITAMINB12", "FOLATE", "FERRITIN", "TIBC", "IRON", "RETICCTPCT" in the last 72 hours.  Sepsis Labs: Recent Labs  Lab 09/14/21 1235 09/14/21 1442  LATICACIDVEN  1.2 1.2     Recent Results (from the past 240 hour(s))  Urine Culture     Status: Abnormal   Collection Time: 09/14/21 11:21 AM   Specimen: In/Out Cath Urine  Result Value Ref Range Status   Specimen Description IN/OUT CATH URINE  Final   Special Requests   Final    NONE Performed at [facility], [address], [place], [place] [address]    Culture MULTIPLE SPECIES PRESENT, SUGGEST RECOLLECTION (A)  Final   Report Status 09/15/2021 FINAL  Final  Blood culture (routine x 2)     Status: None   Collection Time: 09/14/21 12:55 PM   Specimen: BLOOD  Result Value Ref Range Status   Specimen Description BLOOD RIGHT FOREARM  Final   Special Requests   Final    BOTTLES DRAWN AEROBIC AND ANAEROBIC Blood Culture adequate volume   Culture   Final    NO GROWTH 5 DAYS Performed at [facility], [place] [address], [place], [place] [address]    Report Status 09/19/2021 FINAL  Final  Blood culture (routine x 2)     Status: Abnormal   Collection Time: 09/14/21  2:42 PM   Specimen: BLOOD  Result Value Ref Range Status   Specimen Description  BLOOD RIGHT ANTECUBITAL  Final   Special Requests   Final    BOTTLES DRAWN AEROBIC AND ANAEROBIC Blood Culture adequate volume   Culture  Setup Time   Final    GRAM POSITIVE RODS AEROBIC BOTTLE ONLY CRITICAL RESULT CALLED TO, READ BACK BY AND VERIFIED WITH: J. FRENS PHARMD, AT [id number] 09/15/21 D.VANHOOK Performed at [facility], [place] [address] [place], [place] [address]    Culture CORYNEBACTERIUM STRIATUM (A)  Final   Report Status 09/17/2021 FINAL  Final  MRSA Next Gen by PCR, Nasal     Status: Abnormal   Collection Time: 09/16/21  4:10 PM   Specimen: Nasal Mucosa; Nasal Swab  Result Value Ref Range Status   MRSA by PCR Next Gen DETECTED (A) NOT DETECTED Final    Comment: RESULT CALLED TO, READ BACK BY AND VERIFIED WITH: RN KELLY HILL ON 09/16/21 @ 1835 BY DRT (NOTE) The GeneXpert MRSA Assay (FDA approved for NASAL specimens only), is one component of a comprehensive MRSA colonization surveillance program. It is not intended to diagnose MRSA infection nor to guide or monitor treatment for MRSA infections. Test performance is not FDA approved in patients less than 18 years old. Performed at [facility], [place] [address] [place], [place] [address]   Aerobic/Anaerobic Culture w Gram Stain (surgical/deep wound)     Status: None (Preliminary result)   Collection Time: 09/17/21  2:25 PM   Specimen: Other Source; Abscess  Result Value Ref Range Status   Specimen Description OTHER  Final   Special Requests  LEFT LEG  Final   Gram Stain   Final    FEW WBC PRESENT, PREDOMINANTLY PMN NO ORGANISMS SEEN    Culture   Final    NO GROWTH 2 DAYS NO ANAEROBES ISOLATED; CULTURE IN PROGRESS FOR 5 DAYS Performed at [place] [address], [place], [place] [address]    Report Status PENDING  Incomplete         Radiology Studies: No results found.      Scheduled Meds:  Chlorhexidine Gluconate Cloth  6 each Topical Daily   digoxin  0.125 mg Oral Daily    gabapentin  600  mg Oral TID   levothyroxine  137 mcg Oral QAC breakfast   metoprolol tartrate  25 mg Oral BID   mupirocin ointment  1 Application Nasal BID   rivaroxaban  20 mg Oral Q supper   sodium chloride flush  3 mL Intravenous Q12H   Continuous Infusions:  sodium chloride     cefTRIAXone (ROCEPHIN)  IV 2 g (09/19/21 0942)   vancomycin 1,250 mg (09/19/21 1040)     LOS: 4 days    Time spent: 35 min  Georgette Shell, MD 09/19/2021, 2:24 PM

## 2021-09-19 NOTE — Progress Notes (Signed)
Pharmacy Antibiotic Note  [name] is a 70 y.o. male admitted on 09/14/2021 with cellulitis.  No acute changes in renal function during admission. ID following with pending cultures to guide therapy. Pharmacy has been consulted for Vancomycin dosing.  Plan: Continue ceftriaxone  Continue vancomycin '1250mg'$  IV q12 hours  Consider checking level within next few days pending on culture results  Will continue to monitor renal function and for resolution of infection.    Height: '5\' 8"'$  (172.7 cm) Weight: (!) 181.5 kg (400 lb 2.2 oz) IBW/kg (Calculated) : 68.4  Temp (24hrs), Avg:98 F (36.7 C), Min:97.7 F (36.5 C), Max:98.3 F (36.8 C)  Recent Labs  Lab 09/14/21 1235 09/14/21 1442 09/15/21 0711 09/16/21 0404 09/17/21 0417 09/18/21 0350 09/19/21 0313  WBC 8.2  --  7.9 4.6 3.2* 2.4* 2.8*  CREATININE 1.14  --  1.00 1.00 1.01 0.88 0.87  LATICACIDVEN 1.2 1.2  --   --   --   --   --     Estimated Creatinine Clearance: 126.9 mL/min (by C-G formula based on SCr of 0.87 mg/dL).    Allergies  Allergen Reactions   Morphine Hives    Antimicrobials this admission: Vancomycin 8/8 >> Ceftriaxone 8/8 >>   Dose adjustments this admission: N/A  Microbiology results: 8/10 wound cx: NGTD x2 8/9 MRSA positive  8/7 Bcx x2: corynebacteria striatum 1/4  Thank you for allowing pharmacy to be a part of this patient's care.   Gena Fray, PharmD PGY1 Pharmacy Resident   09/19/2021 2:26 PM

## 2021-09-19 NOTE — Plan of Care (Signed)

## 2021-09-20 DIAGNOSIS — L03116 Cellulitis of left lower limb: Secondary | ICD-10-CM | POA: Diagnosis not present

## 2021-09-20 LAB — GLUCOSE, CAPILLARY
Glucose-Capillary: 120 mg/dL — ABNORMAL HIGH (ref 70–99)
Glucose-Capillary: 117 mg/dL — ABNORMAL HIGH (ref 70–99)
Glucose-Capillary: 92 mg/dL (ref 70–99)
Glucose-Capillary: 105 mg/dL — ABNORMAL HIGH (ref 70–99)

## 2021-09-20 MED ORDER — METOPROLOL TARTRATE 12.5 MG HALF TABLET
12.5000 mg | ORAL_TABLET | Freq: Two times a day (BID) | ORAL | Status: DC
  Administered 2021-09-20 – 2021-09-22 (×4): 12.5 mg via ORAL
  Filled 2021-09-20 (×4): qty 1

## 2021-09-20 NOTE — Progress Notes (Signed)
[facility] Infectious Diseases Follow Up Note  Patient Identification: Patient Name: [name] MRN: [id number] [place] Date: 09/14/2021 10:13 AM Age: 71 y.o.Today's Date: 09/20/2021  Reason for Visit: cellulitis/possible abscess   Principal Problem:   Left leg cellulitis Active Problems:   Cellulitis   Persistent atrial fibrillation (HCC)   OSA (obstructive sleep apnea)-3 L of oxygen at night and not CPAP   Hypothyroidism   Macrocytic anemia   Morbid obesity with BMI of 60.0-69.9, adult (HCC)   Hypokalemia   Chronic systolic CHF (congestive heart failure) ([place])   Fall at home, initial encounter   Unilateral AKA, left (HCC)  Antibiotics:  Vancomycin 8/9-c Ceftriaxone 8/7-c   Lines/Hardware: PIV   Interval Events: continues to be afebrile, no leukocytosis, no growth in aspirate cx  Assessment 40 Y O male with PMH of P A-fib, DVT, chronic diastolic CHF, hypothyroidism, morbid obesity/OSA, chronic hypoxic respiratory failure on 4 L nasal cannula, Left AKA who presented to ED 8/7 with fall at home.  CT left femur Chronic low-density collection abutting the resection margin of the distal femur measures approximately 5.6 x 2.1 x 5.3 cm, decreased in size from prior (previously measuring up to 7.8 cm).  S/p IR aspiration of left AKA stump fluid collection. No organisms and NG in 3 days    8/7 blood cx 1/4 bottles - corynebacterium striatum, likely a contaminant vs secondary bacteremia 2/2 cellulitis ,covered by Vancomycin however  Recommendations Continue Vancomycin, pharmacy to dose and ceftriaxone, overall erythema is improving, still has induration in the left lateral thigh, expect to transition to po abtx in 1/2 days with continued clinical improvement   Fu aspirate cultures  Monitor CBC ( leukopenia), CMP and Vancomycin trough   Rest of the management as per the primary team. Thank you for the consult. Please page  with pertinent questions or concerns.  ______________________________________________________________________ Subjective patient seen and examined at the bedside.  Redness in the left AKA stump overall improving, some tenderness+ Denies nausea, vomiting and diarrhea   Vitals BP 100/68 (BP Location: Left Wrist)   Pulse 80   Temp 98.1 F (36.7 C) (Oral)   Resp 16   Ht '5\' 8"'$  (1.727 m)   Wt (!) 181.5 kg   SpO2 98%   BMI 60.84 kg/m     Physical Exam Constitutional:  Morbidly obese, lying in the bed, [place]    Comments:   Cardiovascular:     Rate and Rhythm: Normal rate and regular rhythm.     Heart sounds:  Pulmonary:     Effort: Pulmonary effort is normal on [place]    Comments:   Abdominal:     Palpations: Abdomen is soft.     Tenderness: non distended and non tender   Musculoskeletal:        General: left AKA stump with improving redness, mild induration in the left lateral thigh, no fluctuance and crepitus ( improving)  Skin:    Comments:   Neurological:     General: grossly non focal, awake. Alert and oriented   Psychiatric:        Mood and Affect: Mood normal.   Pertinent Microbiology Results for orders placed or performed during the hospital encounter of 09/14/21  Urine Culture     Status: Abnormal   Collection Time: 09/14/21 11:21 AM   Specimen: In/Out Cath Urine  Result Value Ref Range Status   Specimen Description IN/OUT CATH URINE  Final   Special Requests   Final    NONE  Performed at [facility], [place] [address] [place], [place] [address]    Culture MULTIPLE SPECIES PRESENT, SUGGEST RECOLLECTION (A)  Final   Report Status 09/15/2021 FINAL  Final  Blood culture (routine x 2)     Status: None   Collection Time: 09/14/21 12:55 PM   Specimen: BLOOD  Result Value Ref Range Status   Specimen Description BLOOD RIGHT FOREARM  Final   Special Requests   Final    BOTTLES DRAWN AEROBIC AND ANAEROBIC Blood Culture adequate volume   Culture   Final    NO  GROWTH 5 DAYS Performed at [facility], [place] [address] [place], [place] [address]    Report Status 09/19/2021 FINAL  Final  Blood culture (routine x 2)     Status: Abnormal   Collection Time: 09/14/21  2:42 PM   Specimen: BLOOD  Result Value Ref Range Status   Specimen Description BLOOD RIGHT ANTECUBITAL  Final   Special Requests   Final    BOTTLES DRAWN AEROBIC AND ANAEROBIC Blood Culture adequate volume   Culture  Setup Time   Final    GRAM POSITIVE RODS AEROBIC BOTTLE ONLY CRITICAL RESULT CALLED TO, READ BACK BY AND VERIFIED WITH: J. FRENS PHARMD, AT [id number] 09/15/21 D.VANHOOK Performed at [facility], [place] [address] [place], [place] [address]    Culture CORYNEBACTERIUM STRIATUM (A)  Final   Report Status 09/17/2021 FINAL  Final  MRSA Next Gen by PCR, Nasal     Status: Abnormal   Collection Time: 09/16/21  4:10 PM   Specimen: Nasal Mucosa; Nasal Swab  Result Value Ref Range Status   MRSA by PCR Next Gen DETECTED (A) NOT DETECTED Final    Comment: RESULT CALLED TO, READ BACK BY AND VERIFIED WITH: RN KELLY HILL ON 09/16/21 @ 1835 BY DRT (NOTE) The GeneXpert MRSA Assay (FDA approved for NASAL specimens only), is one component of a comprehensive MRSA colonization surveillance program. It is not intended to diagnose MRSA infection nor to guide or monitor treatment for MRSA infections. Test performance is not FDA approved in patients less than 70 years old. Performed at [facility], [place] [address], [place], [place] [address]   Aerobic/Anaerobic Culture w Gram Stain (surgical/deep wound)     Status: None (Preliminary result)   Collection Time: 09/17/21  2:25 PM   Specimen: Other Source; Abscess  Result Value Ref Range Status   Specimen Description OTHER  Final   Special Requests  LEFT LEG  Final   Gram Stain   Final    FEW WBC PRESENT, PREDOMINANTLY PMN NO ORGANISMS SEEN    Culture   Final    NO GROWTH 2 DAYS NO ANAEROBES ISOLATED; CULTURE IN PROGRESS FOR 5  DAYS Performed at [place] [address], [place],  [address]    Report Status PENDING  Incomplete   Pertinent Lab.    Latest Ref Rng & Units 09/19/2021    3:13 AM 09/18/2021    3:50 AM 09/17/2021    4:17 AM  CBC  WBC 4.0 - 10.5 K/uL 2.8  2.4  3.2   Hemoglobin 13.0 - 17.0 g/dL 9.4  8.9  9.8   Hematocrit 39.0 - 52.0 % 31.3  30.5  32.0   Platelets 150 - 400 K/uL 229  278  205       Latest Ref Rng & Units 09/19/2021    3:13 AM 09/18/2021    3:50 AM 09/17/2021  4:17 AM  CMP  Glucose 70 - 99 mg/dL 108  115  109   BUN 8 - 23 mg/dL '12  16  15   '$ Creatinine 0.61 - 1.24 mg/dL 0.87  0.88  1.01   Sodium 135 - 145 mmol/L 143  140  137   Potassium 3.5 - 5.1 mmol/L 4.4  4.4  4.3   Chloride 98 - 111 mmol/L 105  105  105   CO2 22 - 32 mmol/L 33  30  28   Calcium 8.9 - 10.3 mg/dL 9.1  8.9  8.8   Total Protein 6.5 - 8.1 g/dL 5.8  5.7  6.1   Total Bilirubin 0.3 - 1.2 mg/dL 0.4  0.3  0.4   Alkaline Phos 38 - 126 U/L 52  53  58   AST 15 - 41 U/L '16  19  21   '$ ALT 0 - 44 U/L '16  15  17     '$ Pertinent Imaging today Plain films and CT images have been personally visualized and interpreted; radiology reports have been reviewed. Decision making incorporated into the Impression / Recommendations.  No results found.  I spent 51 minutes for this patient encounter including review of prior medical records, coordination of care with primary/other specialist with greater than 50% of time being face to face/counseling and discussing diagnostics/treatment plan with the patient/family.  Electronically signed by:   Rosiland Oz, MD Infectious Disease Physician [facility] Pager: [phone]

## 2021-09-20 NOTE — Progress Notes (Signed)
PROGRESS NOTE    [name]  ATF:[id number] DOB: April 15, 1950 DOA: 09/14/2021 PCP: Bernerd Limbo, MD    Brief Narrative: 71 year old male with left above-knee amputation, morbid obesity, chronic persistent atrial fibrillation, hypothyroidism, obstructive sleep apnea on 3 L of oxygen no CPAP, chronic systolic heart failure admitted after a fall while he was trying to transfer himself from bed to wheelchair.  He is admitted with a diagnosis of left stump cellulitis.  He is on ceftriaxone vancomycin added 09/16/2021  Assessment & Plan:   Principal Problem:   Left leg cellulitis Active Problems:   Hypokalemia   Fall at home, initial encounter   Persistent atrial fibrillation ([place])   Morbid obesity with BMI of 60.0-69.9, adult (HCC)   Macrocytic anemia   Hypothyroidism   OSA (obstructive sleep apnea)-3 L of oxygen at night and not CPAP   Cellulitis   Chronic systolic CHF (congestive heart failure) (HCC)   Unilateral AKA, left ([place])  #1 left stump cellulitis-slow improvement with Rocephin and vancomycin.  Renal function stable on vancomycin pharmacy following  The stump is much less tender and less warmer compared to yesterday.  Overall improved compared to yesterday. 1 out of 4 blood culture with Corynebacterium stratum.  ID following.   CT of the left femur -chronic low-density collection 5.6 x 2.1 x 5.3 cm decreased in size compared to prior 7.8 cm.  He is status post aspiration 2 cc of dark bloody fluid aspirated by interventional radiology 09/17/2021. The fluid appears turbid with elevated WBC and neutrophils.  Culture negative pending so far CRP trending down. Sed rate still elevated though it could be elevated due to obesity   #2 chronic systolic heart failure stable  #3 orthostatic hypotension he has on multiple medications at home for hypertension including metoprolol Lasix furosemide torsemide digoxin Cardizem These are being held due to orthostasis. Metoprolol has been  started for rate control.  However had to be held today due to soft blood pressure.  #4 persistent atrial fibrillation on digoxin Xarelto and Lopressor  #5 morbid obesity complicates overall prognosis BMI of 56.9  #6 obstructive sleep apnea not on CPAP  #7 hypothyroidism on Synthroid   Pressure Injury 04/13/21 Thigh Posterior;Proximal;Right Stage 2 -  Partial thickness loss of dermis presenting as a shallow open injury with a red, pink wound bed without slough. (Active)  04/13/21 1700  Location: Thigh  Location Orientation: Posterior;Proximal;Right  Staging: Stage 2 -  Partial thickness loss of dermis presenting as a shallow open injury with a red, pink wound bed without slough.  Wound Description (Comments):   Present on Admission: Yes    Estimated body mass index is 60.84 kg/m as calculated from the following:   Height as of this encounter: '5\' 8"'$  (1.727 m).   Weight as of this encounter: 181.5 kg.  DVT prophylaxis: Xarelto  code Status: Full code Family Communication: None at bedside Disposition Plan:  Status is: Inpatient Remains inpatient appropriate because: Cellulitis   Consultants:  ID  Procedures: None Antimicrobials: Rocephin and vancomycin  Subjective:  No new complaints Denies any new issues  Objective: Vitals:   09/20/21 0022 09/20/21 0419 09/20/21 0712 09/20/21 0956  BP: 112/69 112/69 100/68   Pulse: 92 93 85 80  Resp: 20 (!) 21 16   Temp: 98.1 F (36.7 C) 97.7 F (36.5 C) 98.1 F (36.7 C)   TempSrc: Oral Oral Oral   SpO2: 95% 93% 98%   Weight:      Height:  Intake/Output Summary (Last 24 hours) at 09/20/2021 1133 Last data filed at 09/20/2021 4097 Gross per 24 hour  Intake 1239.67 ml  Output 3100 ml  Net -1860.33 ml    Filed Weights   09/16/21 0521 09/17/21 0200 09/18/21 0543  Weight: (!) 170.3 kg (!) 173.2 kg (!) 181.5 kg    Examination:  General exam: Appears in distress due to stump pain Respiratory system: Clear to  auscultation. Respiratory effort normal. Cardiovascular system: S1 & S2 heard, RRR. No JVD, murmurs, rubs, gallops or clicks. No pedal edema. Gastrointestinal system: Abdomen is nondistended, soft and nontender. No organomegaly or masses felt. Normal bowel sounds heard. Central nervous system: Alert and oriented. No focal neurological deficits. Extremities: Left stump less tender warm and erythematous  Psychiatry: Judgement and insight appear normal. Mood & affect appropriate.     Data Reviewed: I have personally reviewed following labs and imaging studies  CBC: Recent Labs  Lab 09/14/21 1235 09/15/21 0711 09/16/21 0404 09/17/21 0417 09/18/21 0350 09/19/21 0313  WBC 8.2 7.9 4.6 3.2* 2.4* 2.8*  NEUTROABS 7.0  --   --   --   --   --   HGB 10.4* 9.8* 9.5* 9.8* 8.9* 9.4*  HCT 33.1* 32.2* 31.5* 32.0* 30.5* 31.3*  MCV 100.3* 102.2* 103.6* 102.9* 106.6* 104.7*  PLT 253 235 199 205 278 353    Basic Metabolic Panel: Recent Labs  Lab 09/14/21 1442 09/15/21 0711 09/16/21 0404 09/17/21 0417 09/18/21 0350 09/19/21 0313  NA  --  138 138 137 140 143  K  --  4.1 4.5 4.3 4.4 4.4  CL  --  104 104 105 105 105  CO2  --  '26 28 28 30 '$ 33*  GLUCOSE  --  101* 124* 109* 115* 108*  BUN  --  '15 16 15 16 12  '$ CREATININE  --  1.00 1.00 1.01 0.88 0.87  CALCIUM  --  8.8* 8.7* 8.8* 8.9 9.1  MG 1.7  --   --   --   --   --     GFR: Estimated Creatinine Clearance: 126.9 mL/min (by C-G formula based on SCr of 0.87 mg/dL). Liver Function Tests: Recent Labs  Lab 09/14/21 1235 09/17/21 0417 09/18/21 0350 09/19/21 0313  AST '21 21 19 16  '$ ALT '14 17 15 16  '$ ALKPHOS 45 58 53 52  BILITOT 1.3* 0.4 0.3 0.4  PROT 6.4* 6.1* 5.7* 5.8*  ALBUMIN 2.7* 2.1* 2.0* 2.1*    No results for input(s): "LIPASE", "AMYLASE" in the last 168 hours. No results for input(s): "AMMONIA" in the last 168 hours. Coagulation Profile: Recent Labs  Lab 09/14/21 1235  INR 1.5*    Cardiac Enzymes: No results for input(s):  "CKTOTAL", "CKMB", "CKMBINDEX", "TROPONINI" in the last 168 hours. BNP (last 3 results) No results for input(s): "PROBNP" in the last 8760 hours. HbA1C: No results for input(s): "HGBA1C" in the last 72 hours.  CBG: Recent Labs  Lab 09/19/21 1108 09/19/21 1557 09/19/21 2039 09/20/21 0555 09/20/21 1050  GLUCAP 103* 140* 109* 120* 117*    Lipid Profile: No results for input(s): "CHOL", "HDL", "LDLCALC", "TRIG", "CHOLHDL", "LDLDIRECT" in the last 72 hours. Thyroid Function Tests: No results for input(s): "TSH", "T4TOTAL", "FREET4", "T3FREE", "THYROIDAB" in the last 72 hours. Anemia Panel: No results for input(s): "VITAMINB12", "FOLATE", "FERRITIN", "TIBC", "IRON", "RETICCTPCT" in the last 72 hours.  Sepsis Labs: Recent Labs  Lab 09/14/21 1235 09/14/21 1442  LATICACIDVEN 1.2 1.2     Recent Results (from the past  240 hour(s))  Urine Culture     Status: Abnormal   Collection Time: 09/14/21 11:21 AM   Specimen: In/Out Cath Urine  Result Value Ref Range Status   Specimen Description IN/OUT CATH URINE  Final   Special Requests   Final    NONE Performed at [facility], [place] [address], [place], [place] [address]    Culture MULTIPLE SPECIES PRESENT, SUGGEST RECOLLECTION (A)  Final   Report Status 09/15/2021 FINAL  Final  Blood culture (routine x 2)     Status: None   Collection Time: 09/14/21 12:55 PM   Specimen: BLOOD  Result Value Ref Range Status   Specimen Description BLOOD RIGHT FOREARM  Final   Special Requests   Final    BOTTLES DRAWN AEROBIC AND ANAEROBIC Blood Culture adequate volume   Culture   Final    NO GROWTH 5 DAYS Performed at [facility], [place] [address], [place], [place] [address]    Report Status 09/19/2021 FINAL  Final  Blood culture (routine x 2)     Status: Abnormal   Collection Time: 09/14/21  2:42 PM   Specimen: BLOOD  Result Value Ref Range Status   Specimen Description BLOOD RIGHT ANTECUBITAL  Final   Special Requests   Final     BOTTLES DRAWN AEROBIC AND ANAEROBIC Blood Culture adequate volume   Culture  Setup Time   Final    GRAM POSITIVE RODS AEROBIC BOTTLE ONLY CRITICAL RESULT CALLED TO, READ BACK BY AND VERIFIED WITH: J. FRENS PHARMD, AT 3664 09/15/21 D.VANHOOK Performed at [facility], [place] [address], [place], [place] [address]    Culture CORYNEBACTERIUM STRIATUM (A)  Final   Report Status 09/17/2021 FINAL  Final  MRSA Next Gen by PCR, Nasal     Status: Abnormal   Collection Time: 09/16/21  4:10 PM   Specimen: Nasal Mucosa; Nasal Swab  Result Value Ref Range Status   MRSA by PCR Next Gen DETECTED (A) NOT DETECTED Final    Comment: RESULT CALLED TO, READ BACK BY AND VERIFIED WITH: RN KELLY HILL ON 09/16/21 @ 1835 BY DRT (NOTE) The GeneXpert MRSA Assay (FDA approved for NASAL specimens only), is one component of a comprehensive MRSA colonization surveillance program. It is not intended to diagnose MRSA infection nor to guide or monitor treatment for MRSA infections. Test performance is not FDA approved in patients less than 56 years old. Performed at [facility], [place] [address] [place], [place] [address]   Aerobic/Anaerobic Culture w Gram Stain (surgical/deep wound)     Status: None (Preliminary result)   Collection Time: 09/17/21  2:25 PM   Specimen: Other Source; Abscess  Result Value Ref Range Status   Specimen Description OTHER  Final   Special Requests  LEFT LEG  Final   Gram Stain   Final    FEW WBC PRESENT, PREDOMINANTLY PMN NO ORGANISMS SEEN    Culture   Final    NO GROWTH 2 DAYS NO ANAEROBES ISOLATED; CULTURE IN PROGRESS FOR 5 DAYS Performed at [facility] [address] [place], [place] [address]    Report Status PENDING  Incomplete         Radiology Studies: No results found.      Scheduled Meds:  Chlorhexidine Gluconate Cloth  6 each Topical Daily   digoxin  0.125 mg Oral Daily   gabapentin  600 mg Oral TID   levothyroxine  137 mcg Oral QAC breakfast  metoprolol tartrate  25 mg Oral BID   mupirocin ointment  1 Application Nasal BID   rivaroxaban  20 mg Oral Q supper   sodium chloride flush  3 mL Intravenous Q12H   Continuous Infusions:  sodium chloride     cefTRIAXone (ROCEPHIN)  IV 2 g (09/20/21 0956)   vancomycin Stopped (09/19/21 2240)     LOS: 5 days    Time spent: 30 minutes  Georgette Shell, MD 09/20/2021, 11:33 AM

## 2021-09-21 DIAGNOSIS — L03116 Cellulitis of left lower limb: Secondary | ICD-10-CM | POA: Diagnosis not present

## 2021-09-21 LAB — GLUCOSE, CAPILLARY
Glucose-Capillary: 95 mg/dL (ref 70–99)
Glucose-Capillary: 100 mg/dL — ABNORMAL HIGH (ref 70–99)
Glucose-Capillary: 93 mg/dL (ref 70–99)
Glucose-Capillary: 112 mg/dL — ABNORMAL HIGH (ref 70–99)

## 2021-09-21 MED ORDER — SODIUM CHLORIDE 0.9 % IV SOLN
2.0000 g | INTRAVENOUS | Status: DC
  Administered 2021-09-22: 2 g via INTRAVENOUS
  Filled 2021-09-21: qty 20

## 2021-09-21 NOTE — Progress Notes (Signed)
[facility] Infectious Diseases Follow Up Note  Patient Identification: Patient Name: [name] MRN: [id number] [place] Date: 09/14/2021 10:13 AM Age: 71 y.o.Today's Date: 09/21/2021  Reason for Visit: follow up on cellulitis   Principal Problem:   Left leg cellulitis Active Problems:   Cellulitis   Persistent atrial fibrillation (HCC)   OSA (obstructive sleep apnea)-3 L of oxygen at night and not CPAP   Hypothyroidism   Macrocytic anemia   Morbid obesity with BMI of 60.0-69.9, adult (HCC)   Hypokalemia   Chronic systolic CHF (congestive heart failure) ([place])   Fall at home, initial encounter   Unilateral AKA, left (HCC)  Antibiotics:  Vancomycin 8/9-c Ceftriaxone 8/7-c   Lines/Hardware: PIV   Interval Events: continues to be afebrile, no leukocytosis, no growth in aspirate cx  Assessment 62 Y O male with PMH of P A-fib, DVT, chronic diastolic CHF, hypothyroidism, morbid obesity/OSA, chronic hypoxic respiratory failure on 4 L nasal cannula, Left AKA who presented to ED 8/7 with fall at home.  CT left femur Chronic low-density collection abutting the resection margin of the distal femur measures approximately 5.6 x 2.1 x 5.3 cm, decreased in size from prior (previously measuring up to 7.8 cm).  S/p IR aspiration of left AKA stump fluid collection. No organisms and NG in 4 days. Redness and induration in the proximal AKA stump improving    8/7 blood cx 1/4 bottles - corynebacterium striatum, likely a contaminant vs secondary bacteremia 2/2 cellulitis ,covered by Vancomycin however  Recommendations Continue Vancomycin and ceftriaxone inpatient and can switch to doxycycline and PO augmentin when ready to go home to complete 14 days of tx ( IV and PO combined)  Monitor CBC ( leukopenia), CMP and Vancomycin trough   Will follow cultures to completion. Otherwise, ID will sign off.   Rest of the management as per the primary  team. Thank you for the consult. Please page with pertinent questions or concerns.  ______________________________________________________________________ Subjective patient seen and examined at the bedside.  Patient is getting cleaned Pain/soreness in the left AKA area is improving  No new complaints   Vitals BP 105/66 (BP Location: Left Arm)   Pulse 87   Temp (!) 97.5 F (36.4 C) (Oral)   Resp 15   Ht '5\' 8"'$  (1.727 m)   Wt (!) 175.2 kg   SpO2 98%   BMI 58.73 kg/m     Physical Exam Constitutional:  Morbidly obese, lying in the bed, getting cleaned     Comments:   Cardiovascular:     Rate and Rhythm: Normal rate and regular rhythm.     Heart sounds:  Pulmonary:     Effort: Pulmonary effort is normal on [place]    Comments:   Abdominal:     Palpations: Abdomen is soft.     Tenderness: non distended and non tender   Musculoskeletal:        General: left AKA stump with improved redness, induration in the left lateral thigh improving, no fluctuance and crepitus   Skin:    Comments:   Neurological:     General: grossly non focal, Awake. Alert and oriented   Psychiatric:        Mood and Affect: Mood normal.   Pertinent Microbiology Results for orders placed or performed during the hospital encounter of 09/14/21  Urine Culture     Status: Abnormal   Collection Time: 09/14/21 11:21 AM   Specimen: In/Out Cath Urine  Result Value Ref Range Status  Specimen Description IN/OUT CATH URINE  Final   Special Requests   Final    NONE Performed at [facility], [place] [address], [place], [place] [address]    Culture MULTIPLE SPECIES PRESENT, SUGGEST RECOLLECTION (A)  Final   Report Status 09/15/2021 FINAL  Final  Blood culture (routine x 2)     Status: None   Collection Time: 09/14/21 12:55 PM   Specimen: BLOOD  Result Value Ref Range Status   Specimen Description BLOOD RIGHT FOREARM  Final   Special Requests   Final    BOTTLES DRAWN AEROBIC AND ANAEROBIC Blood  Culture adequate volume   Culture   Final    NO GROWTH 5 DAYS Performed at [facility], [place] [address] [place], [place] [address]    Report Status 09/19/2021 FINAL  Final  Blood culture (routine x 2)     Status: Abnormal   Collection Time: 09/14/21  2:42 PM   Specimen: BLOOD  Result Value Ref Range Status   Specimen Description BLOOD RIGHT ANTECUBITAL  Final   Special Requests   Final    BOTTLES DRAWN AEROBIC AND ANAEROBIC Blood Culture adequate volume   Culture  Setup Time   Final    GRAM POSITIVE RODS AEROBIC BOTTLE ONLY CRITICAL RESULT CALLED TO, READ BACK BY AND VERIFIED WITH: J. FRENS PHARMD, AT 8938 09/15/21 D.VANHOOK Performed at [facility], [place] [address], [place], [place] [address]    Culture CORYNEBACTERIUM STRIATUM (A)  Final   Report Status 09/17/2021 FINAL  Final  MRSA Next Gen by PCR, Nasal     Status: Abnormal   Collection Time: 09/16/21  4:10 PM   Specimen: Nasal Mucosa; Nasal Swab  Result Value Ref Range Status   MRSA by PCR Next Gen DETECTED (A) NOT DETECTED Final    Comment: RESULT CALLED TO, READ BACK BY AND VERIFIED WITH: RN KELLY HILL ON 09/16/21 @ 1835 BY DRT (NOTE) The GeneXpert MRSA Assay (FDA approved for NASAL specimens only), is one component of a comprehensive MRSA colonization surveillance program. It is not intended to diagnose MRSA infection nor to guide or monitor treatment for MRSA infections. Test performance is not FDA approved in patients less than 72 years old. Performed at [facility], [place] [address] [place], [place] [address]   Aerobic/Anaerobic Culture w Gram Stain (surgical/deep wound)     Status: None (Preliminary result)   Collection Time: 09/17/21  2:25 PM   Specimen: Other Source; Abscess  Result Value Ref Range Status   Specimen Description OTHER  Final   Special Requests  LEFT LEG  Final   Gram Stain   Final    FEW WBC PRESENT, PREDOMINANTLY PMN NO ORGANISMS SEEN    Culture   Final    NO GROWTH 3 DAYS  NO ANAEROBES ISOLATED; CULTURE IN PROGRESS FOR 5 DAYS Performed at [facility], [address], [place], [place] [address]    Report Status PENDING  Incomplete   Pertinent Lab.    Latest Ref Rng & Units 09/19/2021    3:13 AM 09/18/2021    3:50 AM 09/17/2021    4:17 AM  CBC  WBC 4.0 - 10.5 K/uL 2.8  2.4  3.2   Hemoglobin 13.0 - 17.0 g/dL 9.4  8.9  9.8   Hematocrit 39.0 - 52.0 % 31.3  30.5  32.0   Platelets 150 - 400 K/uL 229  278  205  Latest Ref Rng & Units 09/19/2021    3:13 AM 09/18/2021    3:50 AM 09/17/2021    4:17 AM  CMP  Glucose 70 - 99 mg/dL 108  115  109   BUN 8 - 23 mg/dL '12  16  15   '$ Creatinine 0.61 - 1.24 mg/dL 0.87  0.88  1.01   Sodium 135 - 145 mmol/L 143  140  137   Potassium 3.5 - 5.1 mmol/L 4.4  4.4  4.3   Chloride 98 - 111 mmol/L 105  105  105   CO2 22 - 32 mmol/L 33  30  28   Calcium 8.9 - 10.3 mg/dL 9.1  8.9  8.8   Total Protein 6.5 - 8.1 g/dL 5.8  5.7  6.1   Total Bilirubin 0.3 - 1.2 mg/dL 0.4  0.3  0.4   Alkaline Phos 38 - 126 U/L 52  53  58   AST 15 - 41 U/L '16  19  21   '$ ALT 0 - 44 U/L '16  15  17     '$ Pertinent Imaging today Plain films and CT images have been personally visualized and interpreted; radiology reports have been reviewed. Decision making incorporated into the Impression / Recommendations.  No results found.  I spent 51 minutes for this patient encounter including review of prior medical records, coordination of care with primary/other specialist with greater than 50% of time being face to face/counseling and discussing diagnostics/treatment plan with the patient/family.  Electronically signed by:   Rosiland Oz, MD Infectious Disease Physician [facility] for Infectious Disease Pager: [phone]

## 2021-09-21 NOTE — Progress Notes (Signed)
Occupational Therapy Treatment Patient Details Name: [name] MRN: [id number] DOB: 1950/06/20 Today's Date: 09/21/2021   History of present illness Pt adm with LLE cellulitis after fall at home. Pt recently left SNF against advice and has called the fire department 4 times to get him off of the floor since return home from SNF.  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   OT comments  Patient seen for PT/OT treatment to address bed mobility and transfers. Patient declined transfer to recliner due to no drop arm and performed standing from EOB with mod assist +2 for 4 stands. Patient able to get back to supine with min assist and was able to assist with pulling up in bed. Acute OT to continue to follow.    Recommendations for follow up therapy are one component of a multi-disciplinary discharge planning process, led by the attending physician.  Recommendations may be updated based on patient status, additional functional criteria and insurance authorization.    Follow Up Recommendations  Home health OT    Assistance Recommended at Discharge Intermittent Supervision/Assistance  Patient can return home with the following  A lot of help with walking and/or transfers;A lot of help with bathing/dressing/bathroom;Assistance with cooking/housework;Assist for transportation;Help with stairs or ramp for entrance   Equipment Recommendations  None recommended by OT    Recommendations for Other Services      Precautions / Restrictions Precautions Precautions: Fall Restrictions Weight Bearing Restrictions: No Other Position/Activity Restrictions: monitor L AK amp       Mobility Bed Mobility Overal bed mobility: Needs Assistance Bed Mobility: Rolling, Sidelying to Sit, Sit to Sidelying Rolling: Min guard Sidelying to sit: Min  assist, Mod assist     Sit to sidelying: Min assist General bed mobility comments: uses bed rails to assist and increased time    Transfers Overall transfer level: Needs assistance Equipment used: 2 person hand held assist Transfers: Sit to/from Stand Sit to Stand: Mod assist, +2 physical assistance, +2 safety/equipment, From elevated surface           General transfer comment: 4 stands with hold on bedrails only and support of PT and OT     Balance Overall balance assessment: Needs assistance Sitting-balance support: Feet supported Sitting balance-Leahy Scale: Good     Standing balance support: Bilateral upper extremity supported, During functional activity Standing balance-Leahy Scale: Poor Standing balance comment: reliant on external support when standing                           ADL either performed or assessed with clinical judgement   ADL Overall ADL's : Needs assistance/impaired                           Toilet Transfer Details (indicate cue type and reason): sit to stands performed from EOB to increase independence with toilet transfers           General ADL Comments: performed sit to stands from EOB to increase independence with functional transfers    Extremity/Trunk Assessment              Vision       Perception     Praxis      Cognition Arousal/Alertness: Awake/alert Behavior During Therapy: [facility] for tasks assessed/performed  Overall Cognitive Status: Within Functional Limits for tasks assessed                                          Exercises      Shoulder Instructions       General Comments pt was assisted to stand rfour times with support of UE's, but is fearful of falling and declined to pivot to chair.  Would be possible with a drop arm chair    Pertinent Vitals/ Pain       Pain Assessment Pain Assessment: Faces Faces Pain Scale: Hurts a little bit Pain Location: BUE shoulders and  general soreness Pain Descriptors / Indicators: Grimacing, Guarding Pain Intervention(s): Limited activity within patient's tolerance, Monitored during session, Repositioned  Home Living                                          Prior Functioning/Environment              Frequency  Min 2X/week        Progress Toward Goals  OT Goals(current goals can now be found in the care plan section)  Progress towards OT goals: Progressing toward goals  Acute Rehab OT Goals Patient Stated Goal: go home OT Goal Formulation: With patient Time For Goal Achievement: 09/29/21 Potential to Achieve Goals: Good ADL Goals Pt Will Perform Lower Body Bathing: with min assist;bed level;with adaptive equipment Pt Will Perform Lower Body Dressing: with min assist;bed level;with adaptive equipment Additional ADL Goal #1: Slideboard transfer to drop arm recliner in prep for toilet transfers Additional ADL Goal #2: Bed mobility with min A with necessary rails  Plan Discharge plan needs to be updated    Co-evaluation    PT/OT/SLP Co-Evaluation/Treatment: Yes Reason for Co-Treatment: For patient/therapist safety;To address functional/ADL transfers          AM-PAC OT "6 Clicks" Daily Activity     Outcome Measure   Help from another person eating meals?: None Help from another person taking care of personal grooming?: A Little Help from another person toileting, which includes using toliet, bedpan, or urinal?: A Lot Help from another person bathing (including washing, rinsing, drying)?: A Lot Help from another person to put on and taking off regular upper body clothing?: A Little Help from another person to put on and taking off regular lower body clothing?: A Lot 6 Click Score: 16    End of Session Equipment Utilized During Treatment: Oxygen  OT Visit Diagnosis: Other abnormalities of gait and mobility (R26.89);Muscle weakness (generalized) (M62.81);History of falling  (Z91.81);Repeated falls (R29.6);Pain Pain - Right/Left: Left Pain - part of body: Shoulder   Activity Tolerance Patient tolerated treatment well   Patient Left in bed;with call bell/phone within reach;with bed alarm set   Nurse Communication Mobility status        Time: 6283-6629 OT Time Calculation (min): 27 min  Charges: OT General Charges $OT Visit: 1 Visit OT Treatments $Therapeutic Activity: 8-22 mins  Lodema Hong, [place]  Office [place] 09/21/2021, 2:30 PM

## 2021-09-21 NOTE — Progress Notes (Signed)
PROGRESS NOTE    [name]  ZOX:[id number] DOB: 1950/03/26 DOA: 09/14/2021 PCP: Bernerd Limbo, MD    Brief Narrative: 71 year old male with left above-knee amputation, morbid obesity, chronic persistent atrial fibrillation, hypothyroidism, obstructive sleep apnea on 3 L of oxygen no CPAP, chronic systolic heart failure admitted after a fall while he was trying to transfer himself from bed to wheelchair.  He is admitted with a diagnosis of left stump cellulitis.  He is on ceftriaxone vancomycin added 09/16/2021  Assessment & Plan:   Principal Problem:   Left leg cellulitis Active Problems:   Hypokalemia   Fall at home, initial encounter   Persistent atrial fibrillation ([place])   Morbid obesity with BMI of 60.0-69.9, adult (HCC)   Macrocytic anemia   Hypothyroidism   OSA (obstructive sleep apnea)-3 L of oxygen at night and not CPAP   Cellulitis   Chronic systolic CHF (congestive heart failure) (HCC)   Unilateral AKA, left ([place])  #1 left stump cellulitis-slow improvement with Rocephin and vancomycin.  We will plan on discharging him home on Augmentin and doxycycline 09/22/2021. Renal function stable on vancomycin pharmacy following  The stump is much less tender and less warmer compared to yesterday.  Overall improved compared to yesterday. 1 out of 4 blood culture with Corynebacterium stratum.  ID following.   CT of the left femur -chronic low-density collection 5.6 x 2.1 x 5.3 cm decreased in size compared to prior 7.8 cm.  He is status post aspiration 2 cc of dark bloody fluid aspirated by interventional radiology 09/17/2021. The fluid appears turbid with elevated WBC and neutrophils.  Culture negative pending so far CRP trending down. Sed rate still elevated though it could be elevated due to obesity   #2 chronic systolic heart failure stable  #3 orthostatic hypotension he has on multiple medications at home for hypertension including metoprolol Lasix furosemide torsemide  digoxin Cardizem These are being held due to orthostasis. Metoprolol has been started for rate control.  However had to be held today due to soft blood pressure.  #4 persistent atrial fibrillation on digoxin Xarelto and Lopressor  #5 morbid obesity complicates overall prognosis BMI of 56.9  #6 obstructive sleep apnea not on CPAP  #7 hypothyroidism on Synthroid   Pressure Injury 04/13/21 Thigh Posterior;Proximal;Right Stage 2 -  Partial thickness loss of dermis presenting as a shallow open injury with a red, pink wound bed without slough. (Active)  04/13/21 1700  Location: Thigh  Location Orientation: Posterior;Proximal;Right  Staging: Stage 2 -  Partial thickness loss of dermis presenting as a shallow open injury with a red, pink wound bed without slough.  Wound Description (Comments):   Present on Admission: Yes    Estimated body mass index is 58.73 kg/m as calculated from the following:   Height as of this encounter: '5\' 8"'$  (1.727 m).   Weight as of this encounter: 175.2 kg.  DVT prophylaxis: Xarelto  code Status: Full code Family Communication: None at bedside Disposition Plan:  Status is: Inpatient Remains inpatient appropriate because: Cellulitis   Consultants:  ID  Procedures: None Antimicrobials: Rocephin and vancomycin  Subjective:  No new complaints Denies any new issues  Objective: Vitals:   09/21/21 0420 09/21/21 0708 09/21/21 0921 09/21/21 1114  BP: 91/66 105/66  115/78  Pulse: 74 70 87 81  Resp: '20 15  13  '$ Temp: 98.1 F (36.7 C) (!) 97.5 F (36.4 C)  97.8 F (36.6 C)  TempSrc: Oral Oral  Oral  SpO2: 96% 98%  99%  Weight: (!) 175.2 kg     Height:        Intake/Output Summary (Last 24 hours) at 09/21/2021 1404 Last data filed at 09/21/2021 1402 Gross per 24 hour  Intake 600 ml  Output 2050 ml  Net -1450 ml    Filed Weights   09/17/21 0200 09/18/21 0543 09/21/21 0420  Weight: (!) 173.2 kg (!) 181.5 kg (!) 175.2 kg     Examination:  General exam: Appears in distress due to stump pain Respiratory system: Clear to auscultation. Respiratory effort normal. Cardiovascular system: S1 & S2 heard, RRR. No JVD, murmurs, rubs, gallops or clicks. No pedal edema. Gastrointestinal system: Abdomen is nondistended, soft and nontender. No organomegaly or masses felt. Normal bowel sounds heard. Central nervous system: Alert and oriented. No focal neurological deficits. Extremities: Left stump less tender warm and erythematous  Psychiatry: Judgement and insight appear normal. Mood & affect appropriate.     Data Reviewed: I have personally reviewed following labs and imaging studies  CBC: Recent Labs  Lab 09/15/21 0711 09/16/21 0404 09/17/21 0417 09/18/21 0350 09/19/21 0313  WBC 7.9 4.6 3.2* 2.4* 2.8*  HGB 9.8* 9.5* 9.8* 8.9* 9.4*  HCT 32.2* 31.5* 32.0* 30.5* 31.3*  MCV 102.2* 103.6* 102.9* 106.6* 104.7*  PLT 235 199 205 278 903    Basic Metabolic Panel: Recent Labs  Lab 09/14/21 1442 09/15/21 0711 09/16/21 0404 09/17/21 0417 09/18/21 0350 09/19/21 0313  NA  --  138 138 137 140 143  K  --  4.1 4.5 4.3 4.4 4.4  CL  --  104 104 105 105 105  CO2  --  '26 28 28 30 '$ 33*  GLUCOSE  --  101* 124* 109* 115* 108*  BUN  --  '15 16 15 16 12  '$ CREATININE  --  1.00 1.00 1.01 0.88 0.87  CALCIUM  --  8.8* 8.7* 8.8* 8.9 9.1  MG 1.7  --   --   --   --   --     GFR: Estimated Creatinine Clearance: 124.2 mL/min (by C-G formula based on SCr of 0.87 mg/dL). Liver Function Tests: Recent Labs  Lab 09/17/21 0417 09/18/21 0350 09/19/21 0313  AST '21 19 16  '$ ALT '17 15 16  '$ ALKPHOS 58 53 52  BILITOT 0.4 0.3 0.4  PROT 6.1* 5.7* 5.8*  ALBUMIN 2.1* 2.0* 2.1*    No results for input(s): "LIPASE", "AMYLASE" in the last 168 hours. No results for input(s): "AMMONIA" in the last 168 hours. Coagulation Profile: No results for input(s): "INR", "PROTIME" in the last 168 hours.  Cardiac Enzymes: No results for input(s):  "CKTOTAL", "CKMB", "CKMBINDEX", "TROPONINI" in the last 168 hours. BNP (last 3 results) No results for input(s): "PROBNP" in the last 8760 hours. HbA1C: No results for input(s): "HGBA1C" in the last 72 hours.  CBG: Recent Labs  Lab 09/20/21 1050 09/20/21 1608 09/20/21 2229 09/21/21 0624 09/21/21 1111  GLUCAP 117* 92 105* 95 100*    Lipid Profile: No results for input(s): "CHOL", "HDL", "LDLCALC", "TRIG", "CHOLHDL", "LDLDIRECT" in the last 72 hours. Thyroid Function Tests: No results for input(s): "TSH", "T4TOTAL", "FREET4", "T3FREE", "THYROIDAB" in the last 72 hours. Anemia Panel: No results for input(s): "VITAMINB12", "FOLATE", "FERRITIN", "TIBC", "IRON", "RETICCTPCT" in the last 72 hours.  Sepsis Labs: Recent Labs  Lab 09/14/21 1442  LATICACIDVEN 1.2     Recent Results (from the past 240 hour(s))  Urine Culture     Status: Abnormal   Collection Time: 09/14/21 11:21 AM  Specimen: In/Out Cath Urine  Result Value Ref Range Status   Specimen Description IN/OUT CATH URINE  Final   Special Requests   Final    NONE Performed at [facility], [address], [place], [place] [address]    Culture MULTIPLE SPECIES PRESENT, SUGGEST RECOLLECTION (A)  Final   Report Status 09/15/2021 FINAL  Final  Blood culture (routine x 2)     Status: None   Collection Time: 09/14/21 12:55 PM   Specimen: BLOOD  Result Value Ref Range Status   Specimen Description BLOOD RIGHT FOREARM  Final   Special Requests   Final    BOTTLES DRAWN AEROBIC AND ANAEROBIC Blood Culture adequate volume   Culture   Final    NO GROWTH 5 DAYS Performed at [facility], [place] [address], [place], [place] [address]    Report Status 09/19/2021 FINAL  Final  Blood culture (routine x 2)     Status: Abnormal   Collection Time: 09/14/21  2:42 PM   Specimen: BLOOD  Result Value Ref Range Status   Specimen Description BLOOD RIGHT ANTECUBITAL  Final   Special Requests   Final    BOTTLES DRAWN AEROBIC  AND ANAEROBIC Blood Culture adequate volume   Culture  Setup Time   Final    GRAM POSITIVE RODS AEROBIC BOTTLE ONLY CRITICAL RESULT CALLED TO, READ BACK BY AND VERIFIED WITH: J. FRENS PHARMD, AT 3662 09/15/21 D.VANHOOK Performed at [facility], [place] [address], [place], [place] [address]    Culture CORYNEBACTERIUM STRIATUM (A)  Final   Report Status 09/17/2021 FINAL  Final  MRSA Next Gen by PCR, Nasal     Status: Abnormal   Collection Time: 09/16/21  4:10 PM   Specimen: Nasal Mucosa; Nasal Swab  Result Value Ref Range Status   MRSA by PCR Next Gen DETECTED (A) NOT DETECTED Final    Comment: RESULT CALLED TO, READ BACK BY AND VERIFIED WITH: RN KELLY HILL ON 09/16/21 @ 1835 BY DRT (NOTE) The GeneXpert MRSA Assay (FDA approved for NASAL specimens only), is one component of a comprehensive MRSA colonization surveillance program. It is not intended to diagnose MRSA infection nor to guide or monitor treatment for MRSA infections. Test performance is not FDA approved in patients less than 62 years old. Performed at [facility], [place] [address], [place], [place] [address]   Aerobic/Anaerobic Culture w Gram Stain (surgical/deep wound)     Status: None (Preliminary result)   Collection Time: 09/17/21  2:25 PM   Specimen: Other Source; Abscess  Result Value Ref Range Status   Specimen Description OTHER  Final   Special Requests  LEFT LEG  Final   Gram Stain   Final    FEW WBC PRESENT, PREDOMINANTLY PMN NO ORGANISMS SEEN    Culture   Final    NO GROWTH 4 DAYS NO ANAEROBES ISOLATED; CULTURE IN PROGRESS FOR 5 DAYS Performed at [facility], [address], [place], [place] [address]    Report Status PENDING  Incomplete         Radiology Studies: No results found.      Scheduled Meds:  Chlorhexidine Gluconate Cloth  6 each Topical Daily   digoxin  0.125 mg Oral Daily   gabapentin  600 mg Oral TID   levothyroxine  137 mcg Oral QAC breakfast   metoprolol  tartrate  12.5 mg Oral BID   rivaroxaban  20 mg Oral Q supper  sodium chloride flush  3 mL Intravenous Q12H   Continuous Infusions:  sodium chloride     [START ON 09/22/2021] cefTRIAXone (ROCEPHIN)  IV     vancomycin 1,250 mg (09/21/21 1155)     LOS: 6 days    Time spent: 30 minutes  Georgette Shell, MD 09/21/2021, 2:04 PM

## 2021-09-21 NOTE — Progress Notes (Signed)
Physical Therapy Treatment Patient Details Name: [name] MRN: [id number] DOB: [birth year] Today's Date: 09/21/2021   History of Present Illness Pt adm with LLE cellulitis after fall at home. Pt recently left SNF against advice and has called the fire department 4 times to get him off of the floor since return home from SNF.  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    PT Comments    Pt was seen for attempt to use RW but declined for bedrails.  Has had a fall history and is reasonably worried about losing control.  Have determined with OT that a drop arm chair could solve the fears somewhat, and make a slding transfer possible.  Pt continues to ask for home, and would recommend HHPT and a care giver from the [facility] agency to assist him with self care and safety of mobility.  Follow acutely as goals of PT have outlined.   Recommendations for follow up therapy are one component of a multi-disciplinary discharge planning process, led by the attending physician.  Recommendations may be updated based on patient status, additional functional criteria and insurance authorization.  Follow Up Recommendations  Home health PT Can patient physically be transported by private vehicle: No   Assistance Recommended at Discharge Frequent or constant Supervision/Assistance  Patient can return home with the following Two people to help with walking and/or transfers;Two people to help with bathing/dressing/bathroom;Assistance with cooking/housework;Assist for transportation;Help with stairs or ramp for entrance   Equipment Recommendations  None recommended by PT    Recommendations for Other Services       Precautions / Restrictions Precautions Precautions: Fall Restrictions Weight Bearing Restrictions: No Other Position/Activity Restrictions:  monitor L AK amp     Mobility  Bed Mobility Overal bed mobility: Needs Assistance Bed Mobility: Rolling, Sidelying to Sit, Sit to Sidelying Rolling: Min guard Sidelying to sit: Min assist, Mod assist     Sit to sidelying: Min assist General bed mobility comments: lesser assistance today, using HOB elevation and bedrail    Transfers Overall transfer level: Needs assistance Equipment used: 2 person hand held assist Transfers: Sit to/from Stand Sit to Stand: Mod assist, +2 physical assistance, +2 safety/equipment, From elevated surface           General transfer comment: 4 stands with hold on bedrails only and support of PT and OT    Ambulation/Gait                   Stairs             Wheelchair Mobility    Modified Rankin (Stroke Patients Only)       Balance Overall balance assessment: Needs assistance Sitting-balance support: Feet supported Sitting balance-Leahy Scale: Good     Standing balance support: Bilateral upper extremity supported, During functional activity Standing balance-Leahy Scale: Poor                              Cognition Arousal/Alertness: Awake/alert Behavior During Therapy: WFL for tasks assessed/performed Overall Cognitive Status: Within Functional Limits for tasks assessed  Exercises      General Comments General comments (skin integrity, edema, etc.): pt was assisted to stand rfour times with support of UE's, but is fearful of falling and declined to pivot to chair.  Would be possible with a drop arm chair      Pertinent Vitals/Pain Pain Assessment Pain Assessment: Faces Faces Pain Scale: Hurts a little bit Pain Location: general soreness Pain Descriptors / Indicators: Guarding Pain Intervention(s): Limited activity within patient's tolerance, Monitored during session, Repositioned    Home Living                          Prior  Function            PT Goals (current goals can now be found in the care plan section) Acute Rehab PT Goals Patient Stated Goal: to go home alone Progress towards PT goals: Progressing toward goals    Frequency    Min 3X/week      PT Plan Current plan remains appropriate    Co-evaluation              AM-PAC PT "6 Clicks" Mobility   Outcome Measure  Help needed turning from your back to your side while in a flat bed without using bedrails?: A Little Help needed moving from lying on your back to sitting on the side of a flat bed without using bedrails?: A Little Help needed moving to and from a bed to a chair (including a wheelchair)?: Total Help needed standing up from a chair using your arms (e.g., wheelchair or bedside chair)?: Total Help needed to walk in hospital room?: Total Help needed climbing 3-5 steps with a railing? : Total 6 Click Score: 10    End of Session Equipment Utilized During Treatment: Oxygen Activity Tolerance: Patient tolerated treatment well Patient left: in bed;with call bell/phone within reach;with bed alarm set Nurse Communication: Mobility status PT Visit Diagnosis: Other abnormalities of gait and mobility (R26.89);Muscle weakness (generalized) (M62.81);History of falling (Z91.81) Pain - Right/Left: Right Pain - part of body: Knee     Time: 1610-9604 PT Time Calculation (min) (ACUTE ONLY): 27 min  Charges:  $Therapeutic Activity: 8-22 mins       Ramond Dial 09/21/2021, 1:29 PM  Mee Hives, PT PhD Acute Rehab Dept. Number: [place] and [place]

## 2021-09-22 ENCOUNTER — Encounter (HOSPITAL_COMMUNITY): Payer: Self-pay

## 2021-09-22 ENCOUNTER — Emergency Department (HOSPITAL_COMMUNITY)
Admission: EM | Admit: 2021-09-22 | Discharge: 2021-09-24 | Disposition: A | Discharge status: Home / Self Care | Payer: Medicare Other | Attending: Emergency Medicine | Admitting: Emergency Medicine

## 2021-09-22 ENCOUNTER — Other Ambulatory Visit (HOSPITAL_COMMUNITY): Payer: Self-pay

## 2021-09-22 ENCOUNTER — Other Ambulatory Visit: Payer: Self-pay

## 2021-09-22 DIAGNOSIS — Z609 Problem related to social environment, unspecified: Secondary | ICD-10-CM | POA: Diagnosis not present

## 2021-09-22 DIAGNOSIS — M6281 Muscle weakness (generalized): Secondary | ICD-10-CM | POA: Insufficient documentation

## 2021-09-22 DIAGNOSIS — E039 Hypothyroidism, unspecified: Secondary | ICD-10-CM | POA: Insufficient documentation

## 2021-09-22 DIAGNOSIS — Z789 Other specified health status: Secondary | ICD-10-CM

## 2021-09-22 DIAGNOSIS — R6889 Other general symptoms and signs: Secondary | ICD-10-CM

## 2021-09-22 DIAGNOSIS — R2681 Unsteadiness on feet: Secondary | ICD-10-CM | POA: Insufficient documentation

## 2021-09-22 DIAGNOSIS — Z79899 Other long term (current) drug therapy: Secondary | ICD-10-CM | POA: Insufficient documentation

## 2021-09-22 DIAGNOSIS — Z736 Limitation of activities due to disability: Secondary | ICD-10-CM | POA: Insufficient documentation

## 2021-09-22 DIAGNOSIS — Z7901 Long term (current) use of anticoagulants: Secondary | ICD-10-CM | POA: Insufficient documentation

## 2021-09-22 DIAGNOSIS — I5022 Chronic systolic (congestive) heart failure: Secondary | ICD-10-CM | POA: Insufficient documentation

## 2021-09-22 DIAGNOSIS — Z20822 Contact with and (suspected) exposure to covid-19: Secondary | ICD-10-CM | POA: Insufficient documentation

## 2021-09-22 LAB — COMPREHENSIVE METABOLIC PANEL
ALT: 14 U/L (ref 0–44)
AST: 17 U/L (ref 15–41)
Albumin: 2.3 g/dL — ABNORMAL LOW (ref 3.5–5.0)
Alkaline Phosphatase: 55 U/L (ref 38–126)
Anion gap: 4 — ABNORMAL LOW (ref 5–15)
BUN: 19 mg/dL (ref 8–23)
CO2: 33 mmol/L — ABNORMAL HIGH (ref 22–32)
Calcium: 9.2 mg/dL (ref 8.9–10.3)
Chloride: 106 mmol/L (ref 98–111)
Creatinine, Ser: 0.79 mg/dL (ref 0.61–1.24)
GFR, Estimated: >60 mL/min (ref >60)
Glucose, Bld: 106 mg/dL — ABNORMAL HIGH (ref 70–99)
Potassium: 4.2 mmol/L (ref 3.5–5.1)
Sodium: 143 mmol/L (ref 135–145)
Total Bilirubin: 0.4 mg/dL (ref 0.3–1.2)
Total Protein: 5.9 g/dL — ABNORMAL LOW (ref 6.5–8.1)

## 2021-09-22 LAB — CBC
HCT: 31.8 % — ABNORMAL LOW (ref 39.0–52.0)
Hemoglobin: 9.5 g/dL — ABNORMAL LOW (ref 13.0–17.0)
MCH: 31 pg (ref 26.0–34.0)
MCHC: 29.9 g/dL — ABNORMAL LOW (ref 30.0–36.0)
MCV: 103.9 fL — ABNORMAL HIGH (ref 80.0–100.0)
Platelets: 260 10*3/uL (ref 150–400)
RBC: 3.06 MIL/uL — ABNORMAL LOW (ref 4.22–5.81)
RDW: 15.8 % — ABNORMAL HIGH (ref 11.5–15.5)
WBC: 2.9 10*3/uL — ABNORMAL LOW (ref 4.0–10.5)
nRBC: 0.7 % — ABNORMAL HIGH (ref 0.0–0.2)

## 2021-09-22 LAB — AEROBIC/ANAEROBIC CULTURE W GRAM STAIN (SURGICAL/DEEP WOUND): Culture: NO GROWTH

## 2021-09-22 LAB — RESP PANEL BY RT-PCR (FLU A&B, COVID) ARPGX2
Influenza A by PCR: NEGATIVE
Influenza B by PCR: NEGATIVE
SARS Coronavirus 2 by RT PCR: NEGATIVE

## 2021-09-22 LAB — GLUCOSE, CAPILLARY
Glucose-Capillary: 115 mg/dL — ABNORMAL HIGH (ref 70–99)
Glucose-Capillary: 84 mg/dL (ref 70–99)

## 2021-09-22 MED ORDER — TORSEMIDE 20 MG PO TABS: 10.0000 mg | ORAL_TABLET | Freq: Every day | ORAL | 2 refills | Status: DC

## 2021-09-22 MED ORDER — LEVOTHYROXINE SODIUM 137 MCG PO TABS: 137.0000 ug | ORAL_TABLET | Freq: Every day | ORAL | 4 refills | Status: DC

## 2021-09-22 MED ORDER — TORSEMIDE 20 MG PO TABS
10.0000 mg | ORAL_TABLET | Freq: Every day | ORAL | Status: DC
  Administered 2021-09-22 – 2021-09-24 (×3): 10 mg via ORAL
  Filled 2021-09-22 (×3): qty 1

## 2021-09-22 MED ORDER — HYDROCODONE-ACETAMINOPHEN 5-325 MG PO TABS
1.0000 | ORAL_TABLET | ORAL | Status: DC | PRN
  Administered 2021-09-23: 1 via ORAL
  Filled 2021-09-22: qty 1

## 2021-09-22 MED ORDER — DOXYCYCLINE HYCLATE 100 MG PO TABS
100.0000 mg | ORAL_TABLET | Freq: Two times a day (BID) | ORAL | Status: DC
  Administered 2021-09-22 – 2021-09-24 (×4): 100 mg via ORAL
  Filled 2021-09-22 (×4): qty 1

## 2021-09-22 MED ORDER — DOXYCYCLINE HYCLATE 100 MG PO TABS
100.0000 mg | ORAL_TABLET | Freq: Two times a day (BID) | ORAL | Status: DC
  Administered 2021-09-22: 100 mg via ORAL
  Filled 2021-09-22: qty 1

## 2021-09-22 MED ORDER — AMOXICILLIN-POT CLAVULANATE 500-125 MG PO TABS
1.0000 | ORAL_TABLET | Freq: Three times a day (TID) | ORAL | Status: DC
  Administered 2021-09-22 – 2021-09-24 (×5): 500 mg via ORAL
  Filled 2021-09-22 (×7): qty 1

## 2021-09-22 MED ORDER — METOPROLOL TARTRATE 25 MG PO TABS: 12.5000 mg | ORAL_TABLET | Freq: Two times a day (BID) | ORAL | 4 refills | Status: DC

## 2021-09-22 MED ORDER — DOXYCYCLINE HYCLATE 100 MG PO TABS
100.0000 mg | ORAL_TABLET | Freq: Two times a day (BID) | ORAL | 0 refills | Status: DC
  Filled 2021-09-22: qty 14, 7d supply, fill #0

## 2021-09-22 MED ORDER — METOPROLOL TARTRATE 25 MG PO TABS
12.5000 mg | ORAL_TABLET | Freq: Two times a day (BID) | ORAL | Status: DC
  Administered 2021-09-22 – 2021-09-24 (×4): 12.5 mg via ORAL
  Filled 2021-09-22 (×4): qty 1

## 2021-09-22 MED ORDER — DOXYCYCLINE HYCLATE 100 MG PO TABS: 100.0000 mg | ORAL_TABLET | Freq: Two times a day (BID) | ORAL | Status: DC

## 2021-09-22 MED ORDER — LEVOTHYROXINE SODIUM 137 MCG PO TABS
137.0000 ug | ORAL_TABLET | Freq: Every day | ORAL | 4 refills | Status: DC
  Filled 2021-09-22: qty 30, 30d supply, fill #0

## 2021-09-22 MED ORDER — DOXYCYCLINE HYCLATE 100 MG PO TABS: 100.0000 mg | ORAL_TABLET | Freq: Two times a day (BID) | ORAL | 0 refills | Status: DC

## 2021-09-22 MED ORDER — DIGOXIN 125 MCG PO TABS
0.1250 mg | ORAL_TABLET | Freq: Every day | ORAL | 0 refills | Status: DC
Start: 2021-09-22 — End: 2021-10-16
  Filled 2021-09-22: qty 30, 30d supply, fill #0

## 2021-09-22 MED ORDER — AMOXICILLIN-POT CLAVULANATE 500-125 MG PO TABS
1.0000 | ORAL_TABLET | Freq: Three times a day (TID) | ORAL | 0 refills | Status: DC
  Filled 2021-09-22: qty 21, 7d supply, fill #0

## 2021-09-22 MED ORDER — LEVOTHYROXINE SODIUM 25 MCG PO TABS
137.0000 ug | ORAL_TABLET | Freq: Every day | ORAL | Status: DC
  Administered 2021-09-23 – 2021-09-24 (×2): 137 ug via ORAL
  Filled 2021-09-22 (×3): qty 1

## 2021-09-22 MED ORDER — HYDROCODONE-ACETAMINOPHEN 5-325 MG PO TABS
1.0000 | ORAL_TABLET | ORAL | 0 refills | Status: DC | PRN
  Filled 2021-09-22: qty 30, 3d supply, fill #0

## 2021-09-22 MED ORDER — DIGOXIN 125 MCG PO TABS
0.1250 mg | ORAL_TABLET | Freq: Every day | ORAL | Status: DC
  Administered 2021-09-22 – 2021-09-24 (×3): 0.125 mg via ORAL
  Filled 2021-09-22 (×4): qty 1

## 2021-09-22 MED ORDER — METOPROLOL TARTRATE 25 MG PO TABS
12.5000 mg | ORAL_TABLET | Freq: Two times a day (BID) | ORAL | 4 refills | Status: DC
  Filled 2021-09-22: qty 60, 60d supply, fill #0

## 2021-09-22 MED ORDER — HYDROCODONE-ACETAMINOPHEN 5-325 MG PO TABS: 1.0000 | ORAL_TABLET | ORAL | 0 refills | Status: DC | PRN

## 2021-09-22 MED ORDER — RIVAROXABAN 10 MG PO TABS
10.0000 mg | ORAL_TABLET | Freq: Every day | ORAL | Status: DC
  Administered 2021-09-22: 10 mg via ORAL
  Filled 2021-09-22: qty 1

## 2021-09-22 MED ORDER — AMOXICILLIN-POT CLAVULANATE 500-125 MG PO TABS: 1.0000 | ORAL_TABLET | Freq: Three times a day (TID) | ORAL | 0 refills | Status: DC

## 2021-09-22 MED ORDER — FUROSEMIDE 20 MG PO TABS
20.0000 mg | ORAL_TABLET | Freq: Every day | ORAL | Status: DC | PRN
  Administered 2021-09-23: 20 mg via ORAL
  Filled 2021-09-22: qty 1

## 2021-09-22 MED ORDER — ACETAMINOPHEN 500 MG PO TABS: 500.0000 mg | ORAL_TABLET | Freq: Four times a day (QID) | ORAL | Status: DC | PRN

## 2021-09-22 NOTE — ED Triage Notes (Signed)
Pt bib ems from home; discharged home today from hospital; admitted with left leg cellulitis, aka; admitted 8/7 - 8/15; pt trying to transfer from wheelchair to toilet at home with much difficulty, pt very weak, unable to perform ADLS currently; pt requesting placement at a rehab facility, which pt had declined originally prior to discharge; BP 120 palp, HR 60 , irreg, hx afib, 99% RA; a and o x 4

## 2021-09-22 NOTE — ED Notes (Signed)
Pt placed on hospital bed

## 2021-09-22 NOTE — ED Provider Notes (Signed)
[facility] EMERGENCY DEPARTMENT Provider Note   CSN: [id number] Arrival date & time: 09/22/21  1615     History  No chief complaint on file.   [name] is a 71 y.o. male.  Patient with history of left above-knee amputation, morbid obesity, chronic persistent atrial fibrillation, hypothyroidism, obstructive sleep apnea on 3L O2 [name], chronic systolic heart failure presents today requesting SNF placement. He states that he was just admitted for left stump cellulitis and was discharged this morning after receiving broad spectrum antibiotics. States that PT/OT saw and evaluated him and determined that he was SNF/rehab center appropriate, however he declined these services stating he felt like he could manage at home. Home health was set up at discharge to come out to his home every day. States that upon arriving home where he lives alone, he was trying to use the bathroom and was too weak to transfer from the wheelchair to the toilet. States that given this he realized that he would be unable to manage at home and returns here for SNF/rehab placement. He denies any worsening pain, redness, or drainage from his stump.  The history is provided by the patient. No language interpreter was used.       Home Medications Prior to Admission medications   Medication Sig Start Date End Date Taking? Authorizing Provider  acetaminophen (TYLENOL) 500 MG tablet Take 500 mg by mouth every 6 (six) hours as needed for mild pain or fever.    [provider]  amoxicillin-clavulanate (AUGMENTIN) 500-125 MG tablet Take 1 tablet (500 mg total) by mouth 3 (three) times daily. 09/22/21   Georgette Shell, MD  DESITIN 13 % CREA Apply 1 Application topically 2 (two) times daily as needed. 09/07/21   Jeanell Sparrow, DO  digoxin (LANOXIN) 0.125 MG tablet Take 1 tablet (0.125 mg total) by mouth daily. 09/22/21 10/22/21  Georgette Shell, MD  doxycycline (VIBRA-TABS) 100 MG tablet  Take 1 tablet (100 mg total) by mouth every 12 (twelve) hours. 09/22/21   Georgette Shell, MD  furosemide (LASIX) 20 MG tablet Take 20 mg by mouth daily as needed for fluid. 09/10/21   [provider]  gabapentin (NEURONTIN) 800 MG tablet Take 1 tablet (800 mg total) by mouth 3 (three) times daily for 14 days. 09/07/21 09/21/21  Jeanell Sparrow, DO  HYDROcodone-acetaminophen (NORCO/VICODIN) 5-325 MG tablet Take 1-2 tablets by mouth every 4 (four) hours as needed for moderate pain. 09/22/21   Georgette Shell, MD  levothyroxine (SYNTHROID) 137 MCG tablet Take 1 tablet (137 mcg total) by mouth daily before breakfast. 09/23/21   Georgette Shell, MD  metoprolol tartrate (LOPRESSOR) 25 MG tablet Take 1/2 tablet (12.5 mg total) by mouth 2 (two) times daily. 09/22/21   Georgette Shell, MD  naphazoline-glycerin (CLEAR EYES REDNESS) 0.012-0.2 % SOLN Place 1-2 drops into both eyes 4 (four) times daily as needed for eye irritation. Patient not taking: Reported on 09/14/2021 11/21/19   Persons, Bevely Palmer, [place]  potassium chloride (KLOR-CON M) 10 MEQ tablet Take 10 mEq by mouth daily. 01/06/21   [provider]  Rivaroxaban (XARELTO PO) Take 1 tablet by mouth daily.    [provider]  torsemide (DEMADEX) 20 MG tablet Take 0.5 tablets (10 mg total) by mouth daily. 09/22/21 03/21/22  Georgette Shell, MD      Allergies    Morphine    Review of Systems   Review of Systems  All other  systems reviewed and are negative.   Physical Exam Updated Vital Signs BP 111/64   Pulse 71   Temp 98.3 F (36.8 C) (Oral)   Resp 16   Ht '5\' 8"'$  (1.727 m)   Wt (!) 174 kg   SpO2 96%   BMI 58.33 kg/m  Physical Exam Vitals and nursing note reviewed.  Constitutional:      General: He is not in acute distress.    Appearance: Normal appearance. He is normal weight. He is not ill-appearing, toxic-appearing or diaphoretic.  HENT:     Head: Normocephalic and atraumatic.  Cardiovascular:      Rate and Rhythm: Normal rate.  Pulmonary:     Effort: Pulmonary effort is normal. No respiratory distress.  Musculoskeletal:        General: Normal range of motion.     Cervical back: Normal range of motion.  Skin:    General: Skin is warm and dry.     Comments: Well appearing non-infectious appearing stump status post left AKA. No fluctuance, erythema, warmth, or induration  Neurological:     General: No focal deficit present.     Mental Status: He is alert.  Psychiatric:        Mood and Affect: Mood normal.        Behavior: Behavior normal.     ED Results / Procedures / Treatments   Labs (all labs ordered are listed, but only abnormal results are displayed) Labs Reviewed - No data to display  EKG None  Radiology No results found.  Procedures Procedures    Medications Ordered in ED Medications - No data to display  ED Course/ Medical Decision Making/ A&P                           Medical Decision Making  Patient presents today requesting SNF/rehab placement. He is afebrile, non-toxic appearing, and in no acute distress with reassuring vital signs. Was just discharged this morning after admission for stump cellulitis. Same appears to be healing nicely and is not giving him any new issues. I have reviewed previous PT/OT notes which recommended SNF placement however patient originally refused but is now requesting upon realizing the limitations of his mobility at home where he lives alone. TOC consult placed, patient will have to board in the ED until placement can be arranged. Patient is understanding and amenable with plan.  Findings and plan of care discussed with supervising physician Dr. Armandina Gemma who is in agreement.    Final Clinical Impression(s) / ED Diagnoses Final diagnoses:  Need for follow-up by social worker  Decreased ability to perform activities    Rx / DC Orders ED Discharge Orders     None         Nestor Lewandowsky 09/22/21 1837     Regan Lemming, MD 09/22/21 [phone]

## 2021-09-22 NOTE — ED Notes (Signed)
Pt resting and watching TV, NAD noted, even RR and unlabored, pt within view of staff for any assistance, door open to monitor, side rails up x4 for safety as pt is in a regular hospital bed for comfort, pt voiced no concerns or questions at this time, care on going, will continue to monitor.

## 2021-09-22 NOTE — TOC Transition Note (Addendum)
Transition of Care [facility]) - CM/SW Discharge Note   Patient Details  Name: [name] MRN: [id number] Date of Birth: 1950/06/07  [facility] CM/SW Contact:  Zenon Mayo, RN Phone Number: 09/22/2021, 10:12 AM   Clinical Narrative:    Patient is for dc today, NCM notified [name] with [name].  Patient will need ambulance transport.  Address confirmed.  PTAR scheduled.  Ambulance paper work on unit. Patient wants meds to be sent to [facility] so that he can have then when he leaves,  MD sending to [facility] to be filled.Patient states he has a private duty person that will be coming on the weekends to help him and he states he will have her to come during the week as well.  He states he wants to go home. She will be coming out on Wed's, Sat and Sunday's. He uses a w/chair at home.  [name] with [name] states he sees patient is active with [name], NCM confirmed with [name] she states they will follow patient at dc with the same orders that are in for him.    Final next level of care: [place] Barriers to Discharge: Continued Medical Work up   Patient Goals and CMS Choice Patient states their goals for this hospitalization and ongoing recovery are:: return home CMS Medicare.gov Compare Post Acute Care list provided to:: Patient Choice offered to / list presented to : Patient  Discharge Placement                       Discharge Plan and Services   Discharge Planning Services: CM Consult Post Acute Care Choice: Home Health            DME Agency: NA       HH Arranged: RN, Disease Management, PT, Nurse's Aide, Social Work [employer] Agency: [name] Date [facility] Agency Contacted: 09/18/21 Time [place]: 1719 Representative spoke with at [place]: [name] ([name]) Interventions     Readmission Risk Interventions    11/09/2019    1:42 PM  Readmission Risk Prevention Plan  Transportation Screening  Complete  PCP or Specialist Appt within 5-7 Days Complete  Home Care Screening Complete  Medication Review (RN CM) Complete

## 2021-09-22 NOTE — NC FL2 (Deleted)
[place] LEVEL OF CARE SCREENING TOOL     IDENTIFICATION  Patient Name: [name] Birthdate: 1950/05/20 Sex: male Admission Date (Current Location): 09/22/2021  [facility] and [place] Number:  Herbalist and Address:  [facility], [place] [address], [place], [place] [address]      Provider Number: [id number]  Attending Physician Name and Address:  Regan Lemming, MD  Relative Name and Phone Number:  Evans Lance [facility]) [phone]    Current Level of Care: Hospital Recommended Level of Care: [place] Prior Approval Number:    Date Approved/Denied:   PASRR Number: [id number]  Discharge Plan: Home    Current Diagnoses: Patient Active Problem List   Diagnosis Date Noted   Unilateral AKA, left ([place]) 09/15/2021   Hypokalemia 09/14/2021   Left leg cellulitis 63/87/5643   Chronic systolic CHF (congestive heart failure) ([place]) 09/14/2021   Fall at home, initial encounter 09/14/2021   Acute on chronic combined systolic and diastolic CHF (congestive heart failure) ([place]) 04/17/2021   Physical deconditioning 04/17/2021   Cellulitis of groin, right 04/14/2021   Pressure injury of skin 04/14/2021   Acute respiratory failure with hypoxia ([place]) 01/19/2021   Hypothyroidism    Macrocytic anemia    Morbid obesity with BMI of 60.0-69.9, adult ([place])    Pneumonia due to COVID-19 virus 01/15/2021   Cellulitis of left leg    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[place])    Infection of prosthetic left knee joint ([place]) 11/08/2019   Septic joint of left knee joint ([place]) 11/06/2019   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[place]) 08/15/2019   Open knee wound 08/14/2019   S/P left TK revision 04/03/2019   Administration of long-term prophylactic antibiotics    History of streptococcal infection    History of DVT (deep vein thrombosis)    S/P rev left TK 12/15/2015   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   Preoperative  cardiovascular examination 04/17/2013   OSA (obstructive sleep apnea)-3 L of oxygen at night and not CPAP 11/27/2012   Chronic anticoagulation, with Xarelto 09/10/2012   DVT (deep venous thrombosis), possible 09/10/2012   SOB (shortness of breath) 08/26/2012   Chest discomfort 08/26/2012   Persistent atrial fibrillation ([place]) 08/26/2012   Super obesity 06/27/2012   Expected blood loss anemia 06/27/2012   S/P left TK revision 06/23/2012   Septic arthritis of knee ([place])    Cellulitis 09/19/2010   METHICILLIN RESISTANT STAPHYLOCOCCUS AUREUS INFECTION 09/10/2009   STREPTOCOCCUS INFECTION CCE & UNS SITE GROUP C 07/04/2008   CHRONIC KIDNEY DISEASE UNSPECIFIED 07/04/2008   INFECTION DUE TO INTERNAL ORTH DEVICE NEC 03/02/2006    Orientation RESPIRATION BLADDER Height & Weight     Self, Time, Situation, Place  O2 Continent (External Catheter) Weight: (!) 383 lb 9.6 oz (174 kg) Height:  '5\' 8"'$  (172.7 cm)  BEHAVIORAL SYMPTOMS/MOOD NEUROLOGICAL BOWEL NUTRITION STATUS      Continent Diet  AMBULATORY STATUS COMMUNICATION OF NEEDS Skin   Extensive Assist Verbally Normal                       Personal Care Assistance Level of Assistance  Bathing, Feeding, Dressing Bathing Assistance: Maximum assistance Feeding assistance: Independent Dressing Assistance: Maximum assistance     Functional Limitations Info  Sight, Hearing, Speech Sight Info: Adequate Hearing Info: Adequate Speech Info: Adequate    SPECIAL CARE FACTORS FREQUENCY  PT (By licensed PT), OT (By licensed OT)  PT Frequency: 5 x a week OT Frequency: 5 x a week            Contractures Contractures Info: Not present    Additional Factors Info  Code Status, Allergies Code Status Info: Full Code Allergies Info: Morphine           Current Medications (09/22/2021):  This is the current hospital active medication list No current facility-administered medications for this encounter.   Current Outpatient  Medications  Medication Sig Dispense Refill   acetaminophen (TYLENOL) 500 MG tablet Take 500 mg by mouth every 6 (six) hours as needed for mild pain or fever.     amoxicillin-clavulanate (AUGMENTIN) 500-125 MG tablet Take 1 tablet (500 mg total) by mouth 3 (three) times daily. 21 tablet 0   DESITIN 13 % CREA Apply 1 Application topically 2 (two) times daily as needed. 454 g 0   digoxin (LANOXIN) 0.125 MG tablet Take 1 tablet (0.125 mg total) by mouth daily. 30 tablet 0   doxycycline (VIBRA-TABS) 100 MG tablet Take 1 tablet (100 mg total) by mouth every 12 (twelve) hours. 14 tablet 0   furosemide (LASIX) 20 MG tablet Take 20 mg by mouth daily as needed for fluid.     gabapentin (NEURONTIN) 800 MG tablet Take 1 tablet (800 mg total) by mouth 3 (three) times daily for 14 days. 42 tablet 0   HYDROcodone-acetaminophen (NORCO/VICODIN) 5-325 MG tablet Take 1-2 tablets by mouth every 4 (four) hours as needed for moderate pain. 30 tablet 0   [START ON 09/23/2021] levothyroxine (SYNTHROID) 137 MCG tablet Take 1 tablet (137 mcg total) by mouth daily before breakfast. 30 tablet 4   metoprolol tartrate (LOPRESSOR) 25 MG tablet Take 1/2 tablet (12.5 mg total) by mouth 2 (two) times daily. 60 tablet 4   naphazoline-glycerin (CLEAR EYES REDNESS) 0.012-0.2 % SOLN Place 1-2 drops into both eyes 4 (four) times daily as needed for eye irritation. (Patient not taking: Reported on 09/14/2021)  0   potassium chloride (KLOR-CON M) 10 MEQ tablet Take 10 mEq by mouth daily.     Rivaroxaban (XARELTO PO) Take 1 tablet by mouth daily.     torsemide (DEMADEX) 20 MG tablet Take 0.5 tablets (10 mg total) by mouth daily. 30 tablet 2     Discharge Medications: Please see discharge summary for a list of discharge medications.  Relevant Imaging Results:  Relevant Lab Results:   Additional Information SS# [id number]     Ht:  '5\' 8"'$      Wt:  383 lbs     BMI:  58.33 kg/m2  Raven Furnas C Tarpley-Carter, LCSWA

## 2021-09-22 NOTE — ED Notes (Signed)
Pt transported to [facility], arrived in hospital bed, A&O x4. Pt meal tray was set up for him so he may eat dinner.

## 2021-09-22 NOTE — ED Notes (Signed)
Pt given a urinal, also assisted with finding something on TV to watch

## 2021-09-22 NOTE — Progress Notes (Signed)
ID Brief Note      Component 5 d ago  Specimen Description OTHER   Special Requests  LEFT LEG   Gram Stain FEW WBC PRESENT, PREDOMINANTLY PMN  NO ORGANISMS SEEN   Culture No growth aerobically or anaerobically.  Performed at [facility], [place] [address], [place], [place] [address]   Report Status 09/22/2021 FINAL       Rosiland Oz, MD Infectious Disease Physician [facility] [address] [place], [place] [address] Phone: [phone]  Fax: [phone]

## 2021-09-22 NOTE — ED Notes (Signed)
Dinner tray ordered.

## 2021-09-22 NOTE — NC FL2 (Signed)
[place] LEVEL OF CARE SCREENING TOOL     IDENTIFICATION  Patient Name: [name] Birthdate: 1950/08/03 Sex: male Admission Date (Current Location): 09/22/2021  [facility] and [place] Number:  Herbalist and Address:  The [place], [place] [address] [place], [place] [address]      Provider Number: [id number]  Attending Physician Name and Address:  Regan Lemming, MD  Relative Name and Phone Number:  Evans Lance [facility]) [phone]    Current Level of Care: Hospital Recommended Level of Care: [place] Prior Approval Number:    Date Approved/Denied:   PASRR Number: [id number]  Discharge Plan: SNF    Current Diagnoses: Patient Active Problem List   Diagnosis Date Noted   Unilateral AKA, left ([place]) 09/15/2021   Hypokalemia 09/14/2021   Left leg cellulitis 34/74/2595   Chronic systolic CHF (congestive heart failure) ([place]) 09/14/2021   Fall at home, initial encounter 09/14/2021   Acute on chronic combined systolic and diastolic CHF (congestive heart failure) ([place]) 04/17/2021   Physical deconditioning 04/17/2021   Cellulitis of groin, right 04/14/2021   Pressure injury of skin 04/14/2021   Acute respiratory failure with hypoxia ([place]) 01/19/2021   Hypothyroidism    Macrocytic anemia    Morbid obesity with BMI of 60.0-69.9, adult ([place])    Pneumonia due to COVID-19 virus 01/15/2021   Cellulitis of left leg    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[place])    Infection of prosthetic left knee joint ([place]) 11/08/2019   Septic joint of left knee joint ([place]) 11/06/2019   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[place]) 08/15/2019   Open knee wound 08/14/2019   S/P left TK revision 04/03/2019   Administration of long-term prophylactic antibiotics    History of streptococcal infection    History of DVT (deep vein thrombosis)    S/P rev left TK 12/15/2015   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   Preoperative  cardiovascular examination 04/17/2013   OSA (obstructive sleep apnea)-3 L of oxygen at night and not CPAP 11/27/2012   Chronic anticoagulation, with Xarelto 09/10/2012   DVT (deep venous thrombosis), possible 09/10/2012   SOB (shortness of breath) 08/26/2012   Chest discomfort 08/26/2012   Persistent atrial fibrillation ([place]) 08/26/2012   Super obesity 06/27/2012   Expected blood loss anemia 06/27/2012   S/P left TK revision 06/23/2012   Septic arthritis of knee ([place])    Cellulitis 09/19/2010   METHICILLIN RESISTANT STAPHYLOCOCCUS AUREUS INFECTION 09/10/2009   STREPTOCOCCUS INFECTION CCE & UNS SITE GROUP C 07/04/2008   CHRONIC KIDNEY DISEASE UNSPECIFIED 07/04/2008   INFECTION DUE TO INTERNAL ORTH DEVICE NEC 03/02/2006    Orientation RESPIRATION BLADDER Height & Weight     Self, Time, Situation, Place  O2 Continent (External Catheter) Weight: (!) 383 lb 9.6 oz (174 kg) Height:  '5\' 8"'$  (172.7 cm)  BEHAVIORAL SYMPTOMS/MOOD NEUROLOGICAL BOWEL NUTRITION STATUS      Continent Diet  AMBULATORY STATUS COMMUNICATION OF NEEDS Skin   Extensive Assist Verbally Normal                       Personal Care Assistance Level of Assistance  Bathing, Feeding, Dressing Bathing Assistance: Maximum assistance Feeding assistance: Independent Dressing Assistance: Maximum assistance     Functional Limitations Info  Sight, Hearing, Speech Sight Info: Adequate Hearing Info: Adequate Speech Info: Adequate    SPECIAL CARE FACTORS FREQUENCY  PT (By licensed PT), OT (By licensed OT)  PT Frequency: 5 x a week OT Frequency: 5 x a week            Contractures Contractures Info: Not present    Additional Factors Info  Code Status, Allergies Code Status Info: Full Code Allergies Info: Morphine           Current Medications (09/22/2021):  This is the current hospital active medication list Current Facility-Administered Medications  Medication Dose Route Frequency Provider Last Rate  Last Admin   acetaminophen (TYLENOL) tablet 500 mg  500 mg Oral Q6H PRN Smoot, Sarah A, PA-C       amoxicillin-clavulanate (AUGMENTIN) 500-125 MG per tablet 500 mg  1 tablet Oral TID Smoot, Sarah A, PA-C       digoxin (LANOXIN) tablet 0.125 mg  0.125 mg Oral Daily Smoot, Sarah A, PA-C       doxycycline (VIBRA-TABS) tablet 100 mg  100 mg Oral Q12H Smoot, Sarah A, PA-C       furosemide (LASIX) tablet 20 mg  20 mg Oral Daily PRN Smoot, Sarah A, PA-C       HYDROcodone-acetaminophen (NORCO/VICODIN) 5-325 MG per tablet 1-2 tablet  1-2 tablet Oral Q4H PRN Smoot, Leary Roca, PA-C       [START ON 09/23/2021] levothyroxine (SYNTHROID) tablet 137 mcg  137 mcg Oral QAC breakfast Smoot, Sarah A, PA-C       metoprolol tartrate (LOPRESSOR) tablet 12.5 mg  12.5 mg Oral BID Smoot, Sarah A, PA-C       rivaroxaban (XARELTO) tablet 10 mg  10 mg Oral Daily Smoot, Sarah A, PA-C       torsemide (DEMADEX) tablet 10 mg  10 mg Oral Daily Smoot, Leary Roca, PA-C       Current Outpatient Medications  Medication Sig Dispense Refill   acetaminophen (TYLENOL) 500 MG tablet Take 500 mg by mouth every 6 (six) hours as needed for mild pain or fever.     amoxicillin-clavulanate (AUGMENTIN) 500-125 MG tablet Take 1 tablet (500 mg total) by mouth 3 (three) times daily. 21 tablet 0   DESITIN 13 % CREA Apply 1 Application topically 2 (two) times daily as needed. 454 g 0   digoxin (LANOXIN) 0.125 MG tablet Take 1 tablet (0.125 mg total) by mouth daily. 30 tablet 0   doxycycline (VIBRA-TABS) 100 MG tablet Take 1 tablet (100 mg total) by mouth every 12 (twelve) hours. 14 tablet 0   furosemide (LASIX) 20 MG tablet Take 20 mg by mouth daily as needed for fluid.     gabapentin (NEURONTIN) 800 MG tablet Take 1 tablet (800 mg total) by mouth 3 (three) times daily for 14 days. 42 tablet 0   HYDROcodone-acetaminophen (NORCO/VICODIN) 5-325 MG tablet Take 1-2 tablets by mouth every 4 (four) hours as needed for moderate pain. 30 tablet 0   [START ON  09/23/2021] levothyroxine (SYNTHROID) 137 MCG tablet Take 1 tablet (137 mcg total) by mouth daily before breakfast. 30 tablet 4   metoprolol tartrate (LOPRESSOR) 25 MG tablet Take 1/2 tablet (12.5 mg total) by mouth 2 (two) times daily. 60 tablet 4   naphazoline-glycerin (CLEAR EYES REDNESS) 0.012-0.2 % SOLN Place 1-2 drops into both eyes 4 (four) times daily as needed for eye irritation. (Patient not taking: Reported on 09/14/2021)  0   potassium chloride (KLOR-CON M) 10 MEQ tablet Take 10 mEq by mouth daily.     Rivaroxaban (XARELTO PO) Take 1 tablet by mouth daily.     torsemide (DEMADEX) 20 MG tablet Take 0.5  tablets (10 mg total) by mouth daily. 30 tablet 2     Discharge Medications: Please see discharge summary for a list of discharge medications.  Relevant Imaging Results:  Relevant Lab Results:   Additional Information SS# [id number]     Ht:  '5\' 8"'$      Wt:  383 lbs     BMI:  58.33 kg/m2  Laquetta Racey C Tarpley-Carter, LCSWA

## 2021-09-22 NOTE — Discharge Summary (Signed)
Physician Discharge Summary  [name] BSJ:[id number] DOB: 10-12-1950 DOA: 09/14/2021  PCP: Bernerd Limbo, MD  Admit date: 09/14/2021 Discharge date: 09/22/2021  Admitted From: Home Disposition: Home  Recommendations for Outpatient Follow-up:  Follow up with PCP in 1-2 weeks Please obtain BMP/CBC in one week  Home Health: Yes Equipment/Devices: None Discharge Condition: Stable CODE STATUS: Full Diet cardiac diet   brief/Interim Summary:   71 year old male with left above-knee amputation, morbid obesity, chronic persistent atrial fibrillation, hypothyroidism, obstructive sleep apnea on 3 L of oxygen no CPAP, chronic systolic heart failure admitted after a fall while he was trying to transfer himself from bed to wheelchair.  He is admitted with a diagnosis of left stump cellulitis.  He was treated with vancomycin and Rocephin during the hospital stay.   Discharge Diagnoses:  Principal Problem:   Left leg cellulitis Active Problems:   Hypokalemia   Fall at home, initial encounter   Persistent atrial fibrillation ([place])   Morbid obesity with BMI of 60.0-69.9, adult (HCC)   Macrocytic anemia   Hypothyroidism   OSA (obstructive sleep apnea)-3 L of oxygen at night and not CPAP   Cellulitis   Chronic systolic CHF (congestive heart failure) (HCC)   Unilateral AKA, left ([place])    #1 left stump cellulitis-improved with Rocephin and vancomycin.  His MRSA PCR was positive.  He was discharged on doxycycline and Augmentin to finish a course of 14 days total.   1 out of 4 blood culture with Corynebacterium stratum.  This was thought to be contaminant.   CT of the left femur -chronic low-density collection 5.6 x 2.1 x 5.3 cm decreased in size compared to prior 7.8 cm.  He is status post aspiration 2 cc of dark bloody fluid aspirated by interventional radiology 09/17/2021. The fluid appears turbid with elevated WBC and neutrophils.  Culture negative.  Inflammatory markers trended down. He  was seen by PT recommended SNF.  He ideally should have gone to skilled nursing facility but patient refused adamantly and wanted to go home.  #2 chronic systolic heart failure stable   #3  Essential hypertension his blood pressure was soft during the hospital stay he was discharged on decreased dose of metoprolol and torsemide.  Diltiazem was stopped.  Losartan was stopped.  #4 persistent atrial fibrillation on digoxin Xarelto and metoprolol   #5 morbid obesity complicates overall prognosis BMI of 56.9   #6 obstructive sleep apnea not on CPAP   #7 hypothyroidism on Synthroid  Pressure Injury 04/13/21 Thigh Posterior;Proximal;Right Stage 2 -  Partial thickness loss of dermis presenting as a shallow open injury with a red, pink wound bed without slough. (Active)  04/13/21 1700  Location: Thigh  Location Orientation: Posterior;Proximal;Right  Staging: Stage 2 -  Partial thickness loss of dermis presenting as a shallow open injury with a red, pink wound bed without slough.  Wound Description (Comments):   Present on Admission: Yes    Estimated body mass index is 58.39 kg/m as calculated from the following:   Height as of this encounter: '5\' 8"'$  (1.727 m).   Weight as of this encounter: 174.2 kg.  Discharge Instructions  Discharge Instructions     Diet - low sodium heart healthy   Complete by: As directed    Face-to-face encounter (required for Medicare/Medicaid patients)   Complete by: As directed    I Georgette Shell certify that this patient is under my care and that I, or a nurse practitioner or physician's assistant working with  me, had a face-to-face encounter that meets the physician face-to-face encounter requirements with this patient on 09/22/2021. The encounter with the patient was in whole, or in part for the following medical condition(s) which is the primary reason for home health care (List medical condition): cellulitis   The encounter with the patient was in whole,  or in part, for the following medical condition, which is the primary reason for home health care: left aka   I certify that, based on my findings, the following services are medically necessary home health services: Physical therapy   Reason for Medically Necessary Home Health Services: Therapy- Personnel officer, Public librarian   My clinical findings support the need for the above services: Unsafe ambulation due to balance issues   Further, I certify that my clinical findings support that this patient is homebound due to: Pain interferes with ambulation/mobility   Face-to-face encounter (required for Medicare/Medicaid patients)   Complete by: As directed    I Georgette Shell certify that this patient is under my care and that I, or a nurse practitioner or physician's assistant working with me, had a face-to-face encounter that meets the physician face-to-face encounter requirements with this patient on 09/22/2021. The encounter with the patient was in whole, or in part for the following medical condition(s) which is the primary reason for home health care (List medical condition): left aka   The encounter with the patient was in whole, or in part, for the following medical condition, which is the primary reason for home health care: aka   I certify that, based on my findings, the following services are medically necessary home health services:  Physical therapy Nursing     Reason for Medically Necessary Home Health Services: Skilled Nursing- Lab Monitoring Requiring Frequent Medication Adjustment   My clinical findings support the need for the above services: Unable to leave home safely without assistance and/or assistive device   Further, I certify that my clinical findings support that this patient is homebound due to: Pain interferes with ambulation/mobility   Face-to-face encounter (required for Medicare/Medicaid patients)   Complete by: As directed    I Georgette Shell certify that this patient is under my care and that I, or a nurse practitioner or physician's assistant working with me, had a face-to-face encounter that meets the physician face-to-face encounter requirements with this patient on 09/22/2021. The encounter with the patient was in whole, or in part for the following medical condition(s) which is the primary reason for home health care (List medical condition): cellulitis   The encounter with the patient was in whole, or in part, for the following medical condition, which is the primary reason for home health care: aka   I certify that, based on my findings, the following services are medically necessary home health services:  Physical therapy Nursing     Reason for Medically Necessary Home Health Services: Skilled Nursing- Changes in Medication/Medication Management   My clinical findings support the need for the above services: Unable to leave home safely without assistance and/or assistive device   Further, I certify that my clinical findings support that this patient is homebound due to: Unsafe ambulation due to balance issues   Home Health   Complete by: As directed    To provide the following care/treatments:  PT [place] work     Home Health   Complete by: As directed    To provide the following care/treatments:  PT  OT RN [place] work     Home Health   Complete by: As directed    To provide the following care/treatments:  PT [place] work     Increase activity slowly   Complete by: As directed       Allergies as of 09/22/2021       Reactions   Morphine Hives        Medication List     STOP taking these medications    cephALEXin 500 MG capsule Commonly known as: KEFLEX   diltiazem 240 MG 24 hr capsule Commonly known as: DILACOR XR   guaiFENesin-dextromethorphan 100-10 MG/5ML syrup Commonly known as: ROBITUSSIN DM   losartan 25 MG tablet Commonly  known as: Cozaar   sulfamethoxazole-trimethoprim 800-160 MG tablet Commonly known as: BACTRIM DS       TAKE these medications    acetaminophen 500 MG tablet Commonly known as: TYLENOL Take 500 mg by mouth every 6 (six) hours as needed for mild pain or fever.   amoxicillin-clavulanate 500-125 MG tablet Commonly known as: Augmentin Take 1 tablet (500 mg total) by mouth 3 (three) times daily.   Desitin 13 % Crea Generic drug: Zinc Oxide Apply 1 Application topically 2 (two) times daily as needed.   digoxin 0.125 MG tablet Commonly known as: LANOXIN Take 1 tablet (0.125 mg total) by mouth daily.   doxycycline 100 MG tablet Commonly known as: VIBRA-TABS Take 1 tablet (100 mg total) by mouth every 12 (twelve) hours.   furosemide 20 MG tablet Commonly known as: LASIX Take 20 mg by mouth daily as needed for fluid.   gabapentin 800 MG tablet Commonly known as: Neurontin Take 1 tablet (800 mg total) by mouth 3 (three) times daily for 14 days.   HYDROcodone-acetaminophen 5-325 MG tablet Commonly known as: NORCO/VICODIN Take 1-2 tablets by mouth every 4 (four) hours as needed for moderate pain.   levothyroxine 137 MCG tablet Commonly known as: SYNTHROID Take 1 tablet (137 mcg total) by mouth daily before breakfast. Start taking on: September 23, 2021 What changed:  medication strength how much to take   metoprolol tartrate 25 MG tablet Commonly known as: LOPRESSOR Take 1/2 tablet (12.5 mg total) by mouth 2 (two) times daily. What changed:  medication strength how much to take when to take this   naphazoline-glycerin 0.012-0.2 % Soln Commonly known as: CLEAR EYES REDNESS Place 1-2 drops into both eyes 4 (four) times daily as needed for eye irritation.   potassium chloride 10 MEQ tablet Commonly known as: KLOR-CON M Take 10 mEq by mouth daily.   torsemide 20 MG tablet Commonly known as: DEMADEX Take 0.5 tablets (10 mg total) by mouth daily. What changed: how much  to take   XARELTO PO Take 1 tablet by mouth daily.        Follow-up Information     Bernerd Limbo, MD Follow up.   Specialty: Family Medicine Why: The office will call patient. Contact information: [address] [place] [address] [phone]         Sanda Klein, MD .   Specialty: Cardiology Contact information: [address] [place] [address] [place], [place] Follow up.   Specialty: Home Health Services Why: [facility], HHPT, [facility], [place] Worker Contact information: [place] [address] [phone]  Allergies  Allergen Reactions   Morphine Hives    Consultations: id   Procedures/Studies: IR US Guide Bx Asp/Drain  Result Date: 09/17/2021 INDICATION: 71 year old male with possible infection at site of above knee amputation EXAM: IMAGE GUIDED ASPIRATION OF SOFT TISSUE FLUID MEDICATIONS: None ANESTHESIA/SEDATION: None COMPLICATIONS: None PROCEDURE: Informed written consent was obtained from the patient after a thorough discussion of the procedural risks, benefits and alternatives. All questions were addressed. Maximal Sterile Barrier Technique was utilized including caps, mask, sterile gowns, sterile gloves, sterile drape, hand hygiene and skin antiseptic. A timeout was performed prior to the initiation of the procedure. The patient has amputation site was prepped and draped with chlorhexidine in the usual sterile fashion. 1% lidocaine was used for local anesthesia. Using fluoroscopic imaging, 18 gauge trocar needle was advanced to the tip of the amputation site, at the distal femoral stump/retained intramedullary rod fragment. This was at the site where there was fluid identified on recent CT. Approximately 2 cc of dark bloody fluid aspirated for a sample. Needle was removed and a bandage was applied. Patient tolerated  the procedure well and remained hemodynamically stable throughout. No complications were encountered and no significant blood loss. IMPRESSION: Status post image guided soft tissue aspiration at the femoral stump site of above knee amputation with approximately 2 cc of fluid aspirated for culture. Signed, Dulcy Fanny. Nadene Rubins, RPVI [facility] Radiology Electronically Signed   By: Corrie Mckusick D.O.   On: 09/17/2021 15:28   CT FEMUR LEFT W CONTRAST  Result Date: 09/17/2021 CLINICAL DATA:  Soft tissue infection suspected, thigh, xray done EXAM: CT OF THE LOWER LEFT EXTREMITY WITH CONTRAST TECHNIQUE: Multidetector CT imaging of the lower left extremity was performed according to the standard protocol following intravenous contrast administration. RADIATION DOSE REDUCTION: This exam was performed according to the departmental dose-optimization program which includes automated exposure control, adjustment of the mA and/or kV according to patient size and/or use of iterative reconstruction technique. CONTRAST:  168m OMNIPAQUE IOHEXOL 300 MG/ML  SOLN COMPARISON:  X-ray 09/14/2021, 08/14/2020.  CT 08/21/2020 FINDINGS: Bones/Joint/Cartilage Postsurgical changes from above knee amputation of the left lower extremity. Residual femoral stem hardware remains within the distal femoral metadiaphysis. Bony callus formation and heterotopic ossification along the posteromedial margin of the distal femoral resection margin. No evidence of bony erosion or aggressive periosteal elevation. No acute fracture or dislocation. Left hip joint intact. Ligaments Suboptimally assessed by CT. Muscles and Tendons Post amputation changes of the musculotendinous structures of the distal thigh with muscle atrophy. No intramuscular fluid collections are seen. Soft tissues Skin thickening and subcutaneous edema of the distal stump. Chronic low-density collection abutting the resection margin  of the distal femur measures approximately 5.6 x 2.1 x 5.3 cm, decreased in size from prior (previously measuring up to 7.8 cm). This likely reflects a component of fibrous tissue and nonspecific fluid. No new focal fluid collections. No soft tissue gas. Mildly enlarged left inguinal lymph nodes are likely reactive. IMPRESSION: 1. Postsurgical changes from above-the-knee amputation of the left lower extremity. No evidence to suggest acute osteomyelitis by CT. 2. Chronic low-density collection abutting the resection margin of the distal femur measures approximately 5.6 x 2.1 x 5.3 cm, decreased in size from prior (previously measuring up to 7.8 cm). This likely reflects a component of fibrous tissue and nonspecific fluid. No new focal fluid collections or soft tissue gas. 3. Mildly enlarged left inguinal lymph nodes, likely reactive. Electronically Signed  By: Davina Poke D.O.   On: 09/17/2021 08:12   CT Head Wo Contrast  Result Date: 09/14/2021 CLINICAL DATA:  Status post fall with neck trauma. EXAM: CT HEAD WITHOUT CONTRAST CT CERVICAL SPINE WITHOUT CONTRAST TECHNIQUE: Multidetector CT imaging of the head and cervical spine was performed following the standard protocol without intravenous contrast. Multiplanar CT image reconstructions of the cervical spine were also generated. RADIATION DOSE REDUCTION: This exam was performed according to the departmental dose-optimization program which includes automated exposure control, adjustment of the mA and/or kV according to patient size and/or use of iterative reconstruction technique. COMPARISON:  CT of temporal bones September 07, 2021 FINDINGS: CT HEAD FINDINGS Brain: No evidence of acute infarction, hemorrhage, hydrocephalus, extra-axial collection or mass lesion/mass effect. Vascular: No hyperdense vessel is noted. Skull: Hyperostosis frontalis noted. Negative for fracture or focal lesion. Sinuses/Orbits: No acute finding. Other: None. CT CERVICAL SPINE FINDINGS  Alignment: There is kyphosis of cervical spine. Skull base and vertebrae: No acute fracture. No primary bone lesion or focal pathologic process. Soft tissues and spinal canal: No prevertebral fluid or swelling. No visible canal hematoma. 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 Upper chest: Not included. Other: None. IMPRESSION: 1. No acute intracranial abnormality identified. 2. No acute fracture or subluxation of cervical spine. 3. 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 Electronically Signed   By: Abelardo Diesel M.D.   On: 09/14/2021 13:32   CT Cervical Spine Wo Contrast  Result Date: 09/14/2021 CLINICAL DATA:  Status post fall with neck trauma. EXAM: CT HEAD WITHOUT CONTRAST CT CERVICAL SPINE WITHOUT CONTRAST TECHNIQUE: Multidetector CT imaging of the head and cervical spine was performed following the standard protocol without intravenous contrast. Multiplanar CT image reconstructions of the cervical spine were also generated. RADIATION DOSE REDUCTION: This exam was performed according to the departmental dose-optimization program which includes automated exposure control, adjustment of the mA and/or kV according to patient size and/or use of iterative reconstruction technique. COMPARISON:  CT of temporal bones September 07, 2021 FINDINGS: CT HEAD FINDINGS Brain: No evidence of acute infarction, hemorrhage, hydrocephalus, extra-axial collection or mass lesion/mass effect. Vascular: No hyperdense vessel is noted. Skull: Hyperostosis frontalis noted. Negative for fracture or focal lesion. Sinuses/Orbits: No acute finding. Other: None. CT CERVICAL SPINE FINDINGS Alignment: There is kyphosis of cervical spine. Skull base and vertebrae: No acute fracture. No primary bone lesion or focal pathologic process. Soft tissues and spinal canal: No prevertebral fluid or swelling. No visible canal  hematoma. 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 Upper chest: Not included. Other: None. IMPRESSION: 1. No acute intracranial abnormality identified. 2. No acute fracture or subluxation of cervical spine. 3. 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 Electronically Signed   By: Abelardo Diesel M.D.   On: 09/14/2021 13:32   DG Femur Min 2 Views Left  Result Date: 09/14/2021 CLINICAL DATA:  Cellulitis of the left AKA stump. EXAM: LEFT FEMUR 2 VIEWS COMPARISON:  08/14/2020 FINDINGS: Patient is status post above the knee amputation. Intramedullary metallic stem device noted at the distal end of the femoral bone. No definite bony destruction or lysis to suggest underlying osteomyelitis. IMPRESSION: Status post above-the-knee amputation. No evidence for bony lysis to suggest osteomyelitis. Electronically Signed   By: Misty Stanley M.D.   On: 09/14/2021 11:56   DG  Shoulder Left  Result Date: 09/14/2021 CLINICAL DATA:  Pain after a fall EXAM: LEFT SHOULDER - 2+ VIEW COMPARISON:  None Available. FINDINGS: Visualized portion of the left hemithorax is normal. No acute fracture or dislocation. Advanced degenerative changes of the acromioclavicular and glenohumeral joints, including prominent humeral head osteophytes. IMPRESSION: Advanced degenerative change, without acute osseous finding. Electronically Signed   By: Abigail Miyamoto M.D.   On: 09/14/2021 11:05   DG Chest Port 1 View  Result Date: 09/14/2021 CLINICAL DATA:  Fall.  On blood thinners.  CHF. EXAM: PORTABLE CHEST 1 VIEW COMPARISON:  09/07/2021 CTA chest.  Plain film 09/07/2021. FINDINGS: Mildly degraded exam due to AP portable technique and patient body habitus. Apical lordotic positioning. Midline trachea. Borderline cardiomegaly. Atherosclerosis in the transverse aorta. No pleural effusion or pneumothorax. No congestive failure.  Clear lungs. IMPRESSION: Borderline cardiomegaly and low lung volumes without acute disease. Aortic Atherosclerosis (ICD10-I70.0). Electronically Signed   By: Abigail Miyamoto M.D.   On: 09/14/2021 10:32   CT Temporal Bones Wo Contrast  Result Date: 09/07/2021 CLINICAL DATA:  Bilateral ear pain EXAM: CT TEMPORAL BONES WITHOUT CONTRAST TECHNIQUE: Axial and coronal plane CT imaging of the petrous temporal bones was performed with thin-collimation image reconstruction. No intravenous contrast was administered. Multiplanar CT image reconstructions were also generated. RADIATION DOSE REDUCTION: This exam was performed according to the departmental dose-optimization program which includes automated exposure control, adjustment of the mA and/or kV according to patient size and/or use of iterative reconstruction technique. COMPARISON:  None Available. FINDINGS: RIGHT TEMPORAL BONE External auditory canal: Mild soft tissue thickening Middle ear cavity: Normally aerated. The scutum and ossicles are normal. The tegmen tympani is intact. Inner ear structures: The cochlea, vestibule and semicircular canals are normal. The vestibular aqueduct is not enlarged. Internal auditory and facial nerve canals:  Normal Mastoid air cells: Normally aerated. No osseous erosion. LEFT TEMPORAL BONE External auditory canal: Normal. Middle ear cavity: Normally aerated. The scutum and ossicles are normal. The tegmen tympani is intact. Inner ear structures: The cochlea, vestibule and semicircular canals are normal. The vestibular aqueduct is not enlarged. Internal auditory and facial nerve canals:  Normal. Mastoid air cells: Normally aerated. No osseous erosion. Vascular: Normal non-contrast appearance of the carotid canals, jugular bulbs and sigmoid plates. Limited intracranial:  No acute or significant finding. Visible orbits/paranasal sinuses: No acute or significant finding. Soft tissues: Normal. IMPRESSION: Mild soft tissue thickening of the  right external auditory canal, which could indicate otitis externa. Otherwise normal temporal bones. No mastoid effusion. Electronically Signed   By: Ulyses Jarred M.D.   On: 09/07/2021 19:32   CT ABDOMEN PELVIS W CONTRAST  Result Date: 09/07/2021 CLINICAL DATA:  Abdominal pain EXAM: CT ABDOMEN AND PELVIS WITH CONTRAST TECHNIQUE: Multidetector CT imaging of the abdomen and pelvis was performed using the standard protocol following bolus administration of intravenous contrast. RADIATION DOSE REDUCTION: This exam was performed according to the departmental dose-optimization program which includes automated exposure control, adjustment of the mA and/or kV according to patient size and/or use of iterative reconstruction technique. CONTRAST:  122m OMNIPAQUE IOHEXOL 350 MG/ML SOLN COMPARISON:  CT 04/13/2021 FINDINGS: Lower chest: Bibasilar atelectasis. Mild cardiomegaly. Coronary artery atherosclerotic calcification. Hepatobiliary: No suspicious focal liver abnormality is seen. No gallstones, gallbladder wall thickening, or biliary dilatation. Pancreas: Fatty atrophy of the pancreas. Ductal dilation or surrounding inflammatory changes. Spleen: Normal in size without focal abnormality. Adrenals/Urinary Tract: Adrenal glands are unremarkable. Kidneys are normal, without renal calculi, suspicious focal lesion or  hydronephrosis. Bladder is unremarkable. Stomach/Bowel: Stomach is within normal limits. Appendix appears normal. No evidence of bowel wall thickening, distention, or inflammatory changes. Vascular/Lymphatic: Aortic atherosclerosis. No enlarged abdominal or pelvic lymph nodes. Reproductive: Prostate is unremarkable. Other: Soft tissue swelling in the right groin/inguinal region is no longer visualized. Redemonstrated adjacent nodes with fatty, likely reactive. No abdominopelvic ascites or free intraperitoneal air. Musculoskeletal: Thoracolumbar spondylosis. No acute osseous abnormality. IMPRESSION: No acute  abnormality in the abdomen or pelvis. Electronically Signed   By: Placido Sou M.D.   On: 09/07/2021 19:30   CT Angio Chest PE W and/or Wo Contrast  Result Date: 09/07/2021 CLINICAL DATA:  Diarrhea, shortness of breath, question pulmonary embolism EXAM: CT ANGIOGRAPHY CHEST WITH CONTRAST TECHNIQUE: Multidetector CT imaging of the chest was performed using the standard protocol during bolus administration of intravenous contrast. Multiplanar CT image reconstructions and MIPs were obtained to evaluate the vascular anatomy. RADIATION DOSE REDUCTION: This exam was performed according to the departmental dose-optimization program which includes automated exposure control, adjustment of the mA and/or kV according to patient size and/or use of iterative reconstruction technique. CONTRAST:  114m OMNIPAQUE IOHEXOL 350 MG/ML SOLN IV COMPARISON:  08/26/2012 FINDINGS: Cardiovascular: Enlargement of cardiac chambers. Atherosclerotic calcifications aorta and coronary arteries. Aorta normal caliber. No pericardial effusion. Pulmonary arteries adequately opacified and patent. No evidence of pulmonary embolism. Mediastinum/Nodes: Few scattered normal sized thoracic lymph nodes. No adenopathy. Esophagus unremarkable. Lungs/Pleura: Subsegmental atelectasis BILATERAL lower lobes. Lungs otherwise clear. No pulmonary infiltrate, pleural effusion, or pneumothorax. Upper Abdomen: Visualized upper abdomen unremarkable Musculoskeletal: No acute osseous findings. Review of the MIP images confirms the above findings. IMPRESSION: No evidence of pulmonary embolism. Subsegmental atelectasis BILATERAL lower lobes. Mild enlargement of cardiac chambers. Aortic Atherosclerosis (ICD10-I70.0). Electronically Signed   By: MLavonia DanaM.D.   On: 09/07/2021 18:46   DG Chest Port 1 View  Result Date: 09/07/2021 CLINICAL DATA:  Recent history of CHF EXAM: PORTABLE CHEST 1 VIEW COMPARISON:  04/13/2021 FINDINGS: Transverse diameter heart is  increased. There are no signs of pulmonary edema or focal pulmonary consolidation. There is no pleural effusion or pneumothorax. IMPRESSION: There are no signs of pulmonary edema or focal pulmonary consolidation. Electronically Signed   By: PElmer PickerM.D.   On: 09/07/2021 16:24   (Echo, Carotid, EGD, Colonoscopy, ERCP)    Subjective:  Patient resting in bed anxious to go home no events overnight no nausea vomiting diarrhea this left stump less warm less tender and less swollen Discharge Exam: Vitals:   09/22/21 0713 09/22/21 1055  BP: 118/88 105/84  Pulse: 90 75  Resp: 20 17  Temp: 97.8 F (36.6 C) 97.9 F (36.6 C)  SpO2: 98% 96%   Vitals:   09/22/21 0103 09/22/21 0505 09/22/21 0713 09/22/21 1055  BP:  116/76 118/88 105/84  Pulse:  85 90 75  Resp:  '18 20 17  '$ Temp:  98 F (36.7 C) 97.8 F (36.6 C) 97.9 F (36.6 C)  TempSrc:   Oral Oral  SpO2:  96% 98% 96%  Weight: (!) 174.2 kg     Height:        General: Pt is alert, awake, not in acute distress Cardiovascular: RRR, S1/S2 +, no rubs, no gallops Respiratory: CTA bilaterally, no wheezing, no rhonchi Abdominal: Soft, NT, ND, bowel sounds + Extremities: left stump less swollen tender    The results of significant diagnostics from this hospitalization (including imaging, microbiology, ancillary and laboratory) are listed below for reference.  Microbiology: Recent Results (from the past 240 hour(s))  Urine Culture     Status: Abnormal   Collection Time: 09/14/21 11:21 AM   Specimen: In/Out Cath Urine  Result Value Ref Range Status   Specimen Description IN/OUT CATH URINE  Final   Special Requests   Final    NONE Performed at [facility], [address], [place], [place] [address]    Culture MULTIPLE SPECIES PRESENT, SUGGEST RECOLLECTION (A)  Final   Report Status 09/15/2021 FINAL  Final  Blood culture (routine x 2)     Status: None   Collection Time: 09/14/21 12:55 PM   Specimen: BLOOD  Result  Value Ref Range Status   Specimen Description BLOOD RIGHT FOREARM  Final   Special Requests   Final    BOTTLES DRAWN AEROBIC AND ANAEROBIC Blood Culture adequate volume   Culture   Final    NO GROWTH 5 DAYS Performed at [facility], [place] [address], [place], [place] [address]    Report Status 09/19/2021 FINAL  Final  Blood culture (routine x 2)     Status: Abnormal   Collection Time: 09/14/21  2:42 PM   Specimen: BLOOD  Result Value Ref Range Status   Specimen Description BLOOD RIGHT ANTECUBITAL  Final   Special Requests   Final    BOTTLES DRAWN AEROBIC AND ANAEROBIC Blood Culture adequate volume   Culture  Setup Time   Final    GRAM POSITIVE RODS AEROBIC BOTTLE ONLY CRITICAL RESULT CALLED TO, READ BACK BY AND VERIFIED WITH: J. FRENS PHARMD, AT 6834 09/15/21 D.VANHOOK Performed at [facility], [place] [address], [place], [place] [address]    Culture CORYNEBACTERIUM STRIATUM (A)  Final   Report Status 09/17/2021 FINAL  Final  MRSA Next Gen by PCR, Nasal     Status: Abnormal   Collection Time: 09/16/21  4:10 PM   Specimen: Nasal Mucosa; Nasal Swab  Result Value Ref Range Status   MRSA by PCR Next Gen DETECTED (A) NOT DETECTED Final    Comment: RESULT CALLED TO, READ BACK BY AND VERIFIED WITH: RN KELLY HILL ON 09/16/21 @ 1835 BY DRT (NOTE) The GeneXpert MRSA Assay (FDA approved for NASAL specimens only), is one component of a comprehensive MRSA colonization surveillance program. It is not intended to diagnose MRSA infection nor to guide or monitor treatment for MRSA infections. Test performance is not FDA approved in patients less than 72 years old. Performed at [facility], [place] [address] [place], [place] [address]   Aerobic/Anaerobic Culture w Gram Stain (surgical/deep wound)     Status: None   Collection Time: 09/17/21  2:25 PM   Specimen: Other Source; Abscess  Result Value Ref Range Status   Specimen Description OTHER  Final   Special Requests  LEFT LEG   Final   Gram Stain   Final    FEW WBC PRESENT, PREDOMINANTLY PMN NO ORGANISMS SEEN    Culture   Final    No growth aerobically or anaerobically. Performed at [facility], [place] [address] [place], [place] [address]    Report Status 09/22/2021 FINAL  Final     Labs: BNP (last 3 results) Recent Labs    01/15/21 0945 04/13/21 0830 09/07/21 1546  BNP 260.9* 228.1* 41.7   Basic Metabolic Panel: Recent Labs  Lab 09/16/21 0404 09/17/21 0417 09/18/21 0350 09/19/21 0313 09/22/21 0338  NA 138 137 140 143 143  K 4.5 4.3 4.4  4.4 4.2  CL 104 105 105 105 106  CO2 '28 28 30 '$ 33* 33*  GLUCOSE 124* 109* 115* 108* 106*  BUN '16 15 16 12 19  '$ CREATININE 1.00 1.01 0.88 0.87 0.79  CALCIUM 8.7* 8.8* 8.9 9.1 9.2   Liver Function Tests: Recent Labs  Lab 09/17/21 0417 09/18/21 0350 09/19/21 0313 09/22/21 0338  AST '21 19 16 17  '$ ALT '17 15 16 14  '$ ALKPHOS 58 53 52 55  BILITOT 0.4 0.3 0.4 0.4  PROT 6.1* 5.7* 5.8* 5.9*  ALBUMIN 2.1* 2.0* 2.1* 2.3*   No results for input(s): "LIPASE", "AMYLASE" in the last 168 hours. No results for input(s): "AMMONIA" in the last 168 hours. CBC: Recent Labs  Lab 09/16/21 0404 09/17/21 0417 09/18/21 0350 09/19/21 0313 09/22/21 0338  WBC 4.6 3.2* 2.4* 2.8* 2.9*  HGB 9.5* 9.8* 8.9* 9.4* 9.5*  HCT 31.5* 32.0* 30.5* 31.3* 31.8*  MCV 103.6* 102.9* 106.6* 104.7* 103.9*  PLT 199 205 278 229 260   Cardiac Enzymes: No results for input(s): "CKTOTAL", "CKMB", "CKMBINDEX", "TROPONINI" in the last 168 hours. BNP: Invalid input(s): "POCBNP" CBG: Recent Labs  Lab 09/21/21 1111 09/21/21 1619 09/21/21 2056 09/22/21 0613 09/22/21 1052  GLUCAP 100* 93 112* 115* 84   D-Dimer No results for input(s): "DDIMER" in the last 72 hours. Hgb A1c No results for input(s): "HGBA1C" in the last 72 hours. Lipid Profile No results for input(s): "CHOL", "HDL", "LDLCALC", "TRIG", "CHOLHDL", "LDLDIRECT" in the last 72 hours. Thyroid function studies No results  for input(s): "TSH", "T4TOTAL", "T3FREE", "THYROIDAB" in the last 72 hours.  Invalid input(s): "FREET3" Anemia work up No results for input(s): "VITAMINB12", "FOLATE", "FERRITIN", "TIBC", "IRON", "RETICCTPCT" in the last 72 hours. Urinalysis    Component Value Date/Time   COLORURINE YELLOW 09/14/2021 1121   APPEARANCEUR HAZY (A) 09/14/2021 1121   LABSPEC 1.017 09/14/2021 1121   PHURINE 6.0 09/14/2021 1121   GLUCOSEU NEGATIVE 09/14/2021 1121   HGBUR NEGATIVE 09/14/2021 1121   BILIRUBINUR NEGATIVE 09/14/2021 1121   KETONESUR NEGATIVE 09/14/2021 1121   PROTEINUR 100 (A) 09/14/2021 1121   UROBILINOGEN 0.2 08/26/2012 1731   NITRITE NEGATIVE 09/14/2021 1121   LEUKOCYTESUR NEGATIVE 09/14/2021 1121   Sepsis Labs Recent Labs  Lab 09/17/21 0417 09/18/21 0350 09/19/21 0313 09/22/21 0338  WBC 3.2* 2.4* 2.8* 2.9*   Microbiology Recent Results (from the past 240 hour(s))  Urine Culture     Status: Abnormal   Collection Time: 09/14/21 11:21 AM   Specimen: In/Out Cath Urine  Result Value Ref Range Status   Specimen Description IN/OUT CATH URINE  Final   Special Requests   Final    NONE Performed at [facility], [address] [place], [place] [address]    Culture MULTIPLE SPECIES PRESENT, SUGGEST RECOLLECTION (A)  Final   Report Status 09/15/2021 FINAL  Final  Blood culture (routine x 2)     Status: None   Collection Time: 09/14/21 12:55 PM   Specimen: BLOOD  Result Value Ref Range Status   Specimen Description BLOOD RIGHT FOREARM  Final   Special Requests   Final    BOTTLES DRAWN AEROBIC AND ANAEROBIC Blood Culture adequate volume   Culture   Final    NO GROWTH 5 DAYS Performed at [facility], [place] [address], [place], [place] [address]    Report Status 09/19/2021 FINAL  Final  Blood culture (routine x 2)     Status: Abnormal   Collection Time: 09/14/21  2:42 PM  Specimen: BLOOD  Result Value Ref Range Status   Specimen Description BLOOD RIGHT ANTECUBITAL   Final   Special Requests   Final    BOTTLES DRAWN AEROBIC AND ANAEROBIC Blood Culture adequate volume   Culture  Setup Time   Final    GRAM POSITIVE RODS AEROBIC BOTTLE ONLY CRITICAL RESULT CALLED TO, READ BACK BY AND VERIFIED WITH: J. FRENS PHARMD, AT 0947 09/15/21 D.VANHOOK Performed at [facility], [place] [address], [place], [place] [address]    Culture CORYNEBACTERIUM STRIATUM (A)  Final   Report Status 09/17/2021 FINAL  Final  MRSA Next Gen by PCR, Nasal     Status: Abnormal   Collection Time: 09/16/21  4:10 PM   Specimen: Nasal Mucosa; Nasal Swab  Result Value Ref Range Status   MRSA by PCR Next Gen DETECTED (A) NOT DETECTED Final    Comment: RESULT CALLED TO, READ BACK BY AND VERIFIED WITH: RN KELLY HILL ON 09/16/21 @ 1835 BY DRT (NOTE) The GeneXpert MRSA Assay (FDA approved for NASAL specimens only), is one component of a comprehensive MRSA colonization surveillance program. It is not intended to diagnose MRSA infection nor to guide or monitor treatment for MRSA infections. Test performance is not FDA approved in patients less than 8 years old. Performed at [facility], [place] [address], [place], [place] [address]   Aerobic/Anaerobic Culture w Gram Stain (surgical/deep wound)     Status: None   Collection Time: 09/17/21  2:25 PM   Specimen: Other Source; Abscess  Result Value Ref Range Status   Specimen Description OTHER  Final   Special Requests  LEFT LEG  Final   Gram Stain   Final    FEW WBC PRESENT, PREDOMINANTLY PMN NO ORGANISMS SEEN    Culture   Final    No growth aerobically or anaerobically. Performed at [facility], [place] [address] [place], [place] [address]    Report Status 09/22/2021 FINAL  Final     Time coordinating discharge: 38 minutes  SIGNED:  Georgette Shell, MD  [facility] 09/22/2021, 2:35 PM

## 2021-09-23 MED ORDER — APIXABAN 5 MG PO TABS
5.0000 mg | ORAL_TABLET | Freq: Two times a day (BID) | ORAL | Status: DC
Start: 2021-09-23 — End: 2021-09-24
  Administered 2021-09-23 – 2021-09-24 (×3): 5 mg via ORAL
  Filled 2021-09-23 (×3): qty 1

## 2021-09-23 NOTE — ED Notes (Signed)
Pt assisted with a urinal

## 2021-09-23 NOTE — ED Notes (Signed)
Awakened, given med, answered and talking on phone

## 2021-09-23 NOTE — ED Notes (Addendum)
Sleeping, arousable to voice. Pharm D into see.

## 2021-09-23 NOTE — ED Notes (Signed)
Alert, NAD, calm, interactive, male Bieber working, talking on phone

## 2021-09-23 NOTE — ED Notes (Signed)
Pt place on bed pan. Pt had a soft, mushy like large bowl movement. Pt cleaned up by this tech. Bed pad changed, clean sheet placed on pt. Pt resting comfortable at this time, watching tv.

## 2021-09-23 NOTE — ED Notes (Signed)
Pt appears to be sleeping, even RR and unlabored, NAD noted, pt within view of staff for any assistance needed, side rails up for safety, care on going, will continue to monitor.

## 2021-09-23 NOTE — Progress Notes (Signed)
Bed search started for SNF placement

## 2021-09-23 NOTE — ED Notes (Signed)
Pt continues to sleep, NAD noted, remains within view of staff for safety measures, side rails up to reduce fall risk, care on going, will continue to monitor.

## 2021-09-23 NOTE — ED Notes (Signed)
Pt appears to be sleeping, even RR and unlabored, NAD noted, will continue to monitor.

## 2021-09-23 NOTE — ED Notes (Signed)
SW at [facility] discussing options

## 2021-09-23 NOTE — Evaluation (Signed)
Physical Therapy Evaluation Patient Details Name: [name] MRN: [id number] DOB: 19-Oct-1950 Today's Date: 09/23/2021  History of Present Illness  Pt presenting to the ED secondary to weakness and inability to care for himself at home. 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  Clinical Impression  Pt admitted secondary to problem above with deficits below. Pt requiring mod A for bed mobility tasks this session. Only able to minimally scoot hips along EOB. Previously pt mod I with transfers. Anticipate pt will have increased difficulty caring for himself and performing necessary mobility tasks. REcommending SNF level therapies at d/c. Will continue to follow acutely.        Recommendations for follow up therapy are one component of a multi-disciplinary discharge planning process, led by the attending physician.  Recommendations may be updated based on patient status, additional functional criteria and insurance authorization.  Follow Up Recommendations Skilled nursing-short term rehab (<3 hours/day) Can patient physically be transported by private vehicle: No    Assistance Recommended at Discharge Frequent or constant Supervision/Assistance  Patient can return home with the following  Two people to help with walking and/or transfers;Two people to help with bathing/dressing/bathroom;Assistance with cooking/housework;Assist for transportation;Help with stairs or ramp for entrance    Equipment Recommendations Other (comment) (hoyer lift with pad)  Recommendations for Other Services       Functional Status Assessment Patient has had a recent decline in their functional status and demonstrates the ability to make significant improvements in function in a reasonable and predictable amount of time.     Precautions / Restrictions  Precautions Precautions: Fall Precaution Comments: L AKA Restrictions Weight Bearing Restrictions: No      Mobility  Bed Mobility Overal bed mobility: Needs Assistance Bed Mobility: Supine to Sit, Sit to Supine     Supine to sit: Mod assist Sit to supine: Min guard   General bed mobility comments: Mod A for trunk elevation to come to sitting    Transfers Overall transfer level: Needs assistance                 General transfer comment: Only able to scoot minimally towards [facility] using lateral scoot technique.    Ambulation/Gait                  Stairs            Wheelchair Mobility    Modified Rankin (Stroke Patients Only)       Balance Overall balance assessment: Needs assistance Sitting-balance support: Feet supported Sitting balance-Leahy Scale: Fair                                       Pertinent Vitals/Pain Pain Assessment Pain Assessment: No/denies pain    Home Living Family/patient expects to be discharged to:: Private residence Living Arrangements: Alone Available Help at Discharge: Friend(s);Available PRN/intermittently Type of Home: Apartment Home Access: Level entry       Home Layout: One level Home Equipment: Rollator (4 wheels);Rolling Walker (2 wheels);Wheelchair - manual;Cane - single point;Grab bars - toilet      Prior Function Prior Level of Function : Needs assist             Mobility Comments: Performs bed to w/c transfers with sliding board modified  independent but with multiple falls at home ADLs Comments: Uses [employer] delivery for groceries, fixes sandwiches. Does sponge bath. Uses urinal for voiding. Uses towels in the bed for bowel movements.     Hand Dominance        Extremity/Trunk Assessment   Upper Extremity Assessment Upper Extremity Assessment: Generalized weakness    Lower Extremity Assessment Lower Extremity Assessment: Generalized weakness;LLE deficits/detail LLE  Deficits / Details: L AKA at baseline    Cervical / Trunk Assessment Cervical / Trunk Assessment: Other exceptions (increased body habitus)  Communication   Communication: No difficulties  Cognition Arousal/Alertness: Awake/alert Behavior During Therapy: WFL for tasks assessed/performed Overall Cognitive Status: No family/caregiver present to determine baseline cognitive functioning                                          General Comments      Exercises     Assessment/Plan    PT Assessment Patient needs continued PT services  PT Problem List Decreased strength;Decreased range of motion;Decreased activity tolerance;Decreased balance;Decreased mobility;Obesity;Decreased safety awareness       PT Treatment Interventions DME instruction;Functional mobility training;Therapeutic activities;Therapeutic exercise;Balance training;Patient/family education    PT Goals (Current goals can be found in the Care Plan section)  Acute Rehab PT Goals Patient Stated Goal: to get stronger PT Goal Formulation: With patient Time For Goal Achievement: 10/07/21 Potential to Achieve Goals: Fair    Frequency Min 2X/week     Co-evaluation               AM-PAC PT "6 Clicks" Mobility  Outcome Measure Help needed turning from your back to your side while in a flat bed without using bedrails?: A Little Help needed moving from lying on your back to sitting on the side of a flat bed without using bedrails?: A Lot Help needed moving to and from a bed to a chair (including a wheelchair)?: Total Help needed standing up from a chair using your arms (e.g., wheelchair or bedside chair)?: Total Help needed to walk in hospital room?: Total Help needed climbing 3-5 steps with a railing? : Total 6 Click Score: 9    End of Session   Activity Tolerance: Patient tolerated treatment well Patient left: in bed;with call bell/phone within reach (in bed in ED) Nurse Communication: Mobility  status PT Visit Diagnosis: Unsteadiness on feet (R26.81);Muscle weakness (generalized) (M62.81);Difficulty in walking, not elsewhere classified (R26.2)    Time: 6333-5456 PT Time Calculation (min) (ACUTE ONLY): 18 min   Charges:   PT Evaluation $PT Eval Moderate Complexity: 1 Mod          Reuel Derby, PT, DPT  Acute Rehabilitation Services  Office: [phone]   Rudean Hitt 09/23/2021, 9:36 AM

## 2021-09-23 NOTE — Progress Notes (Signed)
Patient has been denied by 12 SNF facilities. Patient was accepted to [facility] and [facility]. CSW told patient that she can send off the referral to his insurance company to see if they would cover the cost for short term rehab. CSW told patient that for him to stay long term he would have to pay out of pocket. Patient makes $3000 a month and does not qualify for medicaid that will cover long term care at a SNF. CSW told patient that [facility] is $310 per day (9300 per month). [facility] is $320 per day for a private room (9600) and $231 for semi private (6,930). CSW told patient if his medicare approves the 14-21 days for short term he would have to pay those amounts each month after medicare stops covering. Patient stated he could not afford that and told CSW to send him home. CSW told patient he can hire a private aide to come into the home. Patient stated he was willing to do that and he also has someone that can come in on the weekends. CSW spoke with Tim with [facility] who told CSW to fax over patients facesheet for review and he could come visit with patient. CSW is awaiting a call back from Tim to see if he can see patient today or tomorrow and setup services. Patient was also setup with [facility] home health as well.     Janie with admissions at [facility] is willing to take patient. [facility] is also willing to take patient as well if he can pay.

## 2021-09-23 NOTE — ED Notes (Signed)
Placed on bariatric bedpan per request

## 2021-09-23 NOTE — ED Notes (Signed)
Pt sitting upright in bed eating breakfast at this time.

## 2021-09-23 NOTE — ED Notes (Signed)
PT with pt.

## 2021-09-24 NOTE — Progress Notes (Signed)
CSW spoke with Tim with [facility] who stated he spoke with patient yesterday and patient stated he has a lady that will be staying with him in his home. Tim told CSW that patient stated he would get back to him in a few days if he decides to hire extra help in the home.  CSW spoke with patient who stated that he has a lady that will help him at home. CSW asked when those services are going to start and he told CSW on Saturday 09/26/21. CSW asked patient if he had friends or family to call if he needed help in between today and Saturday and he told CSW yes. Patient stated "Get me out of here and I'm ready to go home" Patient stated he did not need [facility] at this time but saved Tim's number in case he needs extra help at home. Patient unable to pay privately for LTC in a nursing facility. Patient declined CSW to place a referral to his united medicare for short term rehab.

## 2021-09-24 NOTE — ED Notes (Signed)
PTAR called at 9:17, eta "next"; informed RN

## 2021-09-24 NOTE — ED Provider Notes (Signed)
Emergency Medicine Observation Re-evaluation Note  Chadwick Reiswig is a 71 y.o. male, seen on rounds today.  Pt initially presented to the ED for complaints of No chief complaint on file. Currently, the patient is waiting for placement.  Physical Exam  BP 122/74   Pulse 75   Temp 98.1 F (36.7 C) (Oral)   Resp 16   Ht 1.727 m ('5\' 8"'$ )   Wt (!) 174 kg   SpO2 98%   BMI 58.33 kg/m  Physical Exam General: No acute distress Cardiac: Regular rate Lungs: Normal effort Psych: Normal mood  ED Course / MDM  EKG:   I have reviewed the labs performed to date as well as medications administered while in observation.  Recent changes in the last 24 hours include no acute events.  Plan  Current plan is for possible nursing home placement.  However patient has been denied by 12 facilities.  Patient can stay at [facility] as well as [facility] however there is a significant out-of-pocket cost..  Per notes yesterday patient may end up getting discharged with outpatient home health  [facility] is not under involuntary commitment.     Dorie Rank, MD 09/24/21 [phone]

## 2021-09-24 NOTE — Discharge Instructions (Signed)
Continue your current medications.  Follow up with your primary care doctor and the home health providers

## 2021-10-06 ENCOUNTER — Telehealth: Payer: Self-pay | Admitting: Cardiovascular Disease

## 2021-10-06 MED ORDER — APIXABAN 5 MG PO TABS: 5.0000 mg | ORAL_TABLET | Freq: Two times a day (BID) | ORAL | 1 refills | Status: DC

## 2021-10-06 MED ORDER — FUROSEMIDE 20 MG PO TABS: 20.0000 mg | ORAL_TABLET | Freq: Every day | ORAL | 1 refills | Status: DC

## 2021-10-06 NOTE — Telephone Encounter (Signed)
-  Anissa pt's home health nurse called seeking clarification regarding pt's medications -Per last hospital discharge, the following medication were listed: xarelto 20 mg eliquis 5 mg Lasix 20 mg Torsemide 20 mg Metoprolol 12.5 mg BID Digoxin 0.125 mg daily -Anissa report pt is unsure of what pt is actually taking and have multiple pill bottles with different medication at home. -Nurse informed home health nurse that pt was last seen in our office  8/12//21 and it would be difficult to clarify medication as pt has had a lot of hospitalization since then. -Nurse recommended pt schedule an appointment. -Nurse contacted pt who report transportation is an issue. He stated he is instructed to not drive and no longer have free ride benefit with insurance. However, he will have his social worker contact our office tomorrow to see what resources are available. Nurse will also reach out to [employer] and MD to see if virtual appointment is an option if we are unable to provide transportation.

## 2021-10-06 NOTE — Telephone Encounter (Signed)
Pt c/o medication issue:  1. Name of Medication:   digoxin (LANOXIN) 0.125 MG tablet    metoprolol tartrate (LOPRESSOR) 25 MG tablet    torsemide (DEMADEX) 20 MG tablet    apixaban (ELIQUIS) 5 MG TABS tablet    rivaroxaban (XARELTO) 20 MG TABS tablet   2. How are you currently taking this medication (dosage and times per day)?   3. Are you having a reaction (difficulty breathing--STAT)? No  4. What is your medication issue? Pt's [facility] Nurse would like a callback to get clarification on above medications for pt. Please advise

## 2021-10-06 NOTE — Telephone Encounter (Signed)
Home health nurse made aware and report she will make a visit to help pt sort medication.  Last vitals: BP 148/72 HR 80

## 2021-10-06 NOTE — Telephone Encounter (Signed)
I agree that there are a lot of contradictions in that medicine list. Please stop the Xarelto, take Eliquis 5 mg twice daily Please stop the torsemide and take furosemide 20 mg once daily. Continue metoprolol 12.5 mg twice daily and digoxin 0.125 mg daily. If is possible, please tell me what his current vital signs are. Needs a follow-up visit first to clarify if these doses of medications are appropriate.  In the past he took metoprolol succinate 100 mg twice daily, so there has been a major reduction in the dose of metoprolol.

## 2021-10-08 NOTE — Telephone Encounter (Signed)
Spoke with pt. Scheduled appointment for Friday 10/16/21 at 2:30 pm. Message sent to social worker regarding transportation.  Ok to add to Dr. Lurline Del DOD slot per clinical supervisor

## 2021-10-13 ENCOUNTER — Telehealth (HOSPITAL_COMMUNITY): Payer: Self-pay | Admitting: Licensed Clinical Social Worker

## 2021-10-13 NOTE — Telephone Encounter (Signed)
CSW consulted to assist with transportation to Friday appt with [facility].  CSW unable to reach-left VM requesting return call from pt to discuss  [name], [place] Worker [facility] Desk#: [phone] Cell#: [phone]

## 2021-10-14 ENCOUNTER — Telehealth (HOSPITAL_COMMUNITY): Payer: Self-pay | Admitting: Licensed Clinical Social Worker

## 2021-10-14 NOTE — Telephone Encounter (Signed)
CSW consulted to help with ride to upcoming appt with [facility].  CSW spoke with pt and confirmed he would need wheelchair Lucianne Lei transport to appt given pt size and mobility concerns (amputee- requires help with transfers).  Ride set up through [employer]- they will provide pt with number to call when appt is done but tentative pick up set for 3:30pm  Pt informed of above  Jorge Ny, [facility] Desk#: [phone] Cell#: [phone]

## 2021-10-15 ENCOUNTER — Telehealth: Payer: Self-pay | Admitting: Licensed Clinical Social Worker

## 2021-10-15 NOTE — Telephone Encounter (Signed)
H&V Care Navigation CSW Progress Note  Clinical Social Worker  provided Engineer, maintenance (IT)  to [employer], Therapist, sports for Dr. Sallyanne Kuster to complete for program. Application was dropped off by [facility] team. Once completed I can submit directly to [place].   Patient is participating in a Managed Medicaid Plan:  No, UHC Medicare only.   SDOH Screenings   Transportation Needs: Unmet Transportation Needs (10/14/2021)  Tobacco Use: Low Risk  (09/22/2021)    Westley Hummer, MSW, LCSW Clinical Social Worker II [place]  [phone]- work cell phone (preferred) [phone]- desk phone

## 2021-10-16 ENCOUNTER — Ambulatory Visit: Payer: Medicare Other | Attending: Cardiovascular Disease | Admitting: Cardiovascular Disease

## 2021-10-16 ENCOUNTER — Encounter: Payer: Self-pay | Admitting: Cardiovascular Disease

## 2021-10-16 VITALS — BP 110/74 | HR 80 | Ht 68.0 in | Wt 372.0 lb

## 2021-10-16 DIAGNOSIS — G4733 Obstructive sleep apnea (adult) (pediatric): Secondary | ICD-10-CM | POA: Diagnosis not present

## 2021-10-16 DIAGNOSIS — I4819 Other persistent atrial fibrillation: Secondary | ICD-10-CM

## 2021-10-16 DIAGNOSIS — E669 Obesity, unspecified: Secondary | ICD-10-CM

## 2021-10-16 DIAGNOSIS — I5022 Chronic systolic (congestive) heart failure: Secondary | ICD-10-CM

## 2021-10-16 DIAGNOSIS — D6869 Other thrombophilia: Secondary | ICD-10-CM

## 2021-10-16 DIAGNOSIS — Z89612 Acquired absence of left leg above knee: Secondary | ICD-10-CM

## 2021-10-16 DIAGNOSIS — I4821 Permanent atrial fibrillation: Secondary | ICD-10-CM | POA: Diagnosis not present

## 2021-10-16 DIAGNOSIS — Z86718 Personal history of other venous thrombosis and embolism: Secondary | ICD-10-CM

## 2021-10-16 MED ORDER — DIGOXIN 125 MCG PO TABS: 0.1250 mg | ORAL_TABLET | ORAL | 4 refills | Status: DC

## 2021-10-16 NOTE — Progress Notes (Unsigned)
H&V Care Navigation CSW Progress Note  Clinical Social Worker  securely emailed completed pt [employer] application  to [email or web address].  Will f/u to ensure paperwork received/is being processed.   Patient is participating in a Managed Medicaid Plan:  No, UHC Medicare only.   SDOH Screenings   Transportation Needs: Unmet Transportation Needs (10/14/2021)  Tobacco Use: Low Risk  (10/16/2021)    Westley Hummer, MSW, LCSW Clinical Social Worker II [place]  [phone]- work cell phone (preferred) [phone]- desk phone

## 2021-10-16 NOTE — Patient Instructions (Signed)
Medication Instructions:  DECREASE the Digoxin to 3 times a week. Monday, Wednesday and Friday  *If you need a refill on your cardiac medications before your next appointment, please call your pharmacy*   Lab Work: None ordered If you have labs (blood work) drawn today and your tests are completely normal, you will receive your results only by: [place] (if you have MyChart) OR A paper copy in the mail If you have any lab test that is abnormal or we need to change your treatment, we will call you to review the results.   Testing/Procedures: None ordered   Follow-Up: At [facility], you and your health needs are our priority.  As part of our continuing mission to provide you with exceptional heart care, we have created designated Provider Care Teams.  These Care Teams include your primary Cardiologist (physician) and Advanced Practice Providers (APPs -  Physician Assistants and Nurse Practitioners) who all work together to provide you with the care you need, when you need it.  We recommend signing up for the patient portal called "MyChart".  Sign up information is provided on this After Visit Summary.  MyChart is used to connect with patients for Virtual Visits (Telemedicine).  Patients are able to view lab/test results, encounter notes, upcoming appointments, etc.  Non-urgent messages can be sent to your provider as well.   To learn more about what you can do with MyChart, go to [email or web address].    Your next appointment:   4 month(s)  The format for your next appointment:   In Person  Provider:   Sanda Klein, MD     Other Instructions Please keep a log of your heart rates and let us know how they are after two weeks.
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
## 2021-10-22 ENCOUNTER — Telehealth: Payer: Self-pay | Admitting: Cardiovascular Disease

## 2021-10-22 ENCOUNTER — Telehealth: Payer: Self-pay

## 2021-10-22 DIAGNOSIS — I5043 Acute on chronic combined systolic (congestive) and diastolic (congestive) heart failure: Secondary | ICD-10-CM

## 2021-10-22 DIAGNOSIS — I4819 Other persistent atrial fibrillation: Secondary | ICD-10-CM

## 2021-10-22 NOTE — Telephone Encounter (Signed)
Attempted to contact patient to schedule a Palliative Care consult appointment. No answer left a message to return call.  

## 2021-10-22 NOTE — Telephone Encounter (Signed)
Left a message for the patient to call back.  

## 2021-10-22 NOTE — Telephone Encounter (Signed)
The patient called and stated that his heart rate has stayed in the 80's since the decrease of the digoxin to three times a week.

## 2021-10-22 NOTE — Telephone Encounter (Signed)
Please stop the digoxin altogether and report back next week with HR

## 2021-10-22 NOTE — Telephone Encounter (Signed)
New Message:    Patient says he was told by Dr C to keep a record of his heart rate and call back to report it.    10-17-21   80 rest of the week was in that range  10-22-21    88    Pt c/o medication issue:  1. Name of Medication: Digoxin  2. How are you currently taking this medication (dosage and times per day)? Dr C had wanted him to stop taking this medicine after he takes the few that he had left  3. Are you having a reaction (difficulty breathing--STAT)?   4. What is your medication issue? This morning he received a brand new bottle of Digoxin and does not need it

## 2021-10-26 ENCOUNTER — Other Ambulatory Visit (HOSPITAL_COMMUNITY): Payer: Self-pay

## 2021-10-26 MED ORDER — POTASSIUM CHLORIDE CRYS ER 10 MEQ PO TBCR: 10.0000 meq | EXTENDED_RELEASE_TABLET | Freq: Every day | ORAL | 1 refills | Status: AC

## 2021-10-26 MED ORDER — FUROSEMIDE 20 MG PO TABS: 20.0000 mg | ORAL_TABLET | Freq: Every day | ORAL | 1 refills | Status: DC

## 2021-10-26 MED ORDER — METOPROLOL TARTRATE 25 MG PO TABS: 12.5000 mg | ORAL_TABLET | Freq: Two times a day (BID) | ORAL | 3 refills | Status: DC

## 2021-10-26 NOTE — Telephone Encounter (Signed)
Patient stated that today, his PT noted pulse (per oximeter) from 54 - 93 within a 5-minute span. He was asymptomatic and still is. Explained that his pulse will fluctuate during the day and night and, as long as it is not staying 90-100, it is okay. No BP was taken at the time. He asked about digoxin. He is taking it on M-W-F and wants to know why it will end in January if he is being weaned off it. He is taking metoprolol tartrate 12.'5mg'$  twice daily. He stated his pills are 12.5 and he is splitting them in half. I called the number he gave me to [facility]. [place] stated they filled metoprolol succinate '100mg'$  daily on 09/03/21. Patient states he is still in afib. Please advise on digoxin and metoprolol.

## 2021-10-26 NOTE — Telephone Encounter (Signed)
Patient following up, requesting to speak with Dr. Victorino December nurse  STAT if HR is under 50 or over 120 (normal HR is 60-100 beats per minute)  What is your heart rate?   Do you have a log of your heart rate readings (document readings)?  Saw PT this morning and HR was ranging 54-93 within a 5 minute time span  Do you have any other symptoms?  No

## 2021-10-26 NOTE — Telephone Encounter (Signed)
Patient advised to stop taking digoxin. He verbalized understanding. He wants refills for metoprolol tartrate ('25mg'$  tablet) 12.'5mg'$  twice daily. He read the label to me word-for-word this time. He verbalized understanding of how to split the tablet. He also asked for refills on lasix '20mg'$  daily and K+ 43mq daily. [facility]

## 2021-10-26 NOTE — Telephone Encounter (Signed)
He has permanent afib.  AFib is highly variable and will change like that in a short time span. HR 54-93 is fine. The most recent instructions were to stop the digoxin altogether. Not sure what it means by "end in January". Stop digoxin. Metoprolol does not come in 12.5 (neither the tartrate nor the succinate). Whatever dose of metoprolol he is taking, it is doing a great job of rate control and he needs to continue it. We just need to have a precise understanding of what he is taking. Please clarify in the chart and medication list.

## 2021-10-30 ENCOUNTER — Telehealth: Payer: Self-pay | Admitting: Cardiovascular Disease

## 2021-10-30 ENCOUNTER — Telehealth: Payer: Self-pay | Admitting: Licensed Clinical Social Worker

## 2021-10-30 ENCOUNTER — Telehealth: Payer: Self-pay

## 2021-10-30 DIAGNOSIS — G4733 Obstructive sleep apnea (adult) (pediatric): Secondary | ICD-10-CM

## 2021-10-30 NOTE — Telephone Encounter (Signed)
H&V Care Navigation CSW Progress Note  Clinical Social Worker contacted patient by phone to f/u on update for [place] application. This had been sent by office securely to eligibility department. He shares he called this morning but hasnt heard back. I encouraged him to give them at least 48 hours to respond as they arent an emergency transportation system. If he doesn't hear from them by Wednesday encouraged him to call me back. Pt also inquired about [employer] Whe[employer] which he believes his [facility] CSW had sent a referral in for. I assisted pt with three way calling the [facility] and he left his name and number for update/to enroll. I shared that there is about a 6 mo waitlist for these services. Pt states understanding.   No additional questions/concerns at this time.   Patient is participating in a Managed Medicaid Plan:  No, UHC Medicare.   SDOH Screenings   Transportation Needs: Unmet Transportation Needs (10/14/2021)  Tobacco Use: Low Risk  (10/16/2021)   Westley Hummer, MSW, LCSW Clinical Social Worker II [place]  [phone]- work cell phone (preferred) [phone]- desk phone

## 2021-10-30 NOTE — Telephone Encounter (Signed)
Attempted to contact patient and patient's sister Jenny Reichmann to schedule a Palliative Care consult appointment. No answer left a message to return call.

## 2021-10-30 NOTE — Telephone Encounter (Signed)
Patient called to follow-up on his [employer] application.

## 2021-10-30 NOTE — Telephone Encounter (Signed)
Patient called to follow-up on Dr. Victorino December recommendation for sleep study.

## 2021-10-30 NOTE — Telephone Encounter (Signed)
Left a message for the patient to call back.  Order for home sleep study has been placed. They will call to get this scheduled.

## 2021-11-02 ENCOUNTER — Telehealth: Payer: Self-pay

## 2021-11-02 NOTE — Telephone Encounter (Signed)
Patient's sister left a message to call patient to schedule. Attempted to contact patient to schedule a Palliative Care consult appointment. No answer left a message to return call.

## 2021-11-03 NOTE — Telephone Encounter (Signed)
Left message for the patient to call back if anything was needed. They will call him to set up the home sleep study.

## 2021-11-06 ENCOUNTER — Telehealth: Payer: Self-pay | Admitting: Licensed Clinical Social Worker

## 2021-11-06 ENCOUNTER — Telehealth: Payer: Self-pay | Admitting: Cardiovascular Disease

## 2021-11-06 NOTE — Telephone Encounter (Signed)
Patient stated he is following up as requested by RN Michiel Cowboy because he has not been contacted for wheelchair pick up with [employer].

## 2021-11-06 NOTE — Telephone Encounter (Signed)
Patient stated he has not heard from [facility] for his 10/25 appointment transportation.

## 2021-11-06 NOTE — Telephone Encounter (Signed)
H&V Care Navigation CSW Progress Note  Clinical Social Worker contacted patient by phone to f/u on [place] application. I was able to reach him this morning at [phone]. Introduced self, role, reason for call. Pt shares he hasnt heard back from them yet about assessment. I followed up with Almedia Balls, eligibility assessment at [employer]. I will f/u with pt once I hear back.  Patient is participating in a Managed Medicaid Plan:  No, UHC Medicare only.   SDOH Screenings   Transportation Needs: Unmet Transportation Needs (10/14/2021)  Tobacco Use: Low Risk  (10/16/2021)    Westley Hummer, MSW, LCSW Clinical Social Worker II [place]  [phone]- work cell phone (preferred) [phone]- desk phone

## 2021-11-10 NOTE — Telephone Encounter (Signed)
H&V Care Navigation CSW Progress Note  Clinical Social Worker  contacted [employer]  to f/u on pt application. Left voicemail requesting call back with any updates/to confirm receipt of application.   Patient is participating in a Managed Medicaid Plan:  No, [place] Medicare only  SDOH Screenings   Transportation Needs: Unmet Transportation Needs (10/14/2021)  Tobacco Use: Low Risk  (10/16/2021)    Westley Hummer, MSW, [place]  [phone]- work cell phone (preferred) [phone]- desk phone

## 2021-11-11 ENCOUNTER — Telehealth: Payer: Self-pay | Admitting: Cardiovascular Disease
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
## 2021-11-11 NOTE — Telephone Encounter (Signed)
Pt c/o medication issue:  1. Name of Medication: CPAP   2. How are you currently taking this medication (dosage and times per day)?   3. Are you having a reaction (difficulty breathing--STAT)?   4. What is your medication issue? Pt is requesting call back to discuss getting a prescription for a new CPAP machine instead of having a sleep study done. He states he would like a CPAP that will be self adjustable. Please advice.    Would also like to discuss application sent in for transportation. He states that he is having issues getting a return call from [place] and would like a return call to discuss this.

## 2021-11-12 NOTE — Telephone Encounter (Signed)
After discussion with Dr Sallyanne Kuster, this patient's sleep study will be changed from [facility] to [facility] and will be given to him at the time of his next visit. Monia Pouch will be working with the patient to get transportation to office  appointment he has scheduled with Dr Sallyanne Kuster.

## 2021-11-17 ENCOUNTER — Telehealth: Payer: Self-pay | Admitting: Cardiovascular Disease

## 2021-11-17 NOTE — Telephone Encounter (Signed)
Please prescribe metoprolol succinate 25 mg once daily

## 2021-11-17 NOTE — Telephone Encounter (Signed)
Pt c/o medication issue:  1. Name of Medication: metoprolol tartrate (LOPRESSOR) 25 MG tablet  2. How are you currently taking this medication (dosage and times per day)? Take 1/2 tablet (12.5 mg total) by mouth 2 (two) times daily.  3. Are you having a reaction (difficulty breathing--STAT)? no  4. What is your medication issue? Patient called stating [facility] needs to be called because they have two scripts for this medication.  They have one for '25mg'$  and one for '50mg'$ .  They won't fill the '25mg'$  until they hear from [place], telling them it's okay to fill the '25mg'$  script as that is what the patient it taking.

## 2021-11-17 NOTE — Telephone Encounter (Signed)
Called [facility] to clarify:  they state patient had previously been on Toprol XL 100 mg daily, last filled 09/25/21.   They needed to clarify new rx of metoprolol tartrate 12.5 mg BID.     Called patient to discuss/clarify medications again (see phone note 9/18)  Patient states he was previously Toprol XL 100 but when he was in hospital this got changed to metoprolol tartrate 12.5 mg BID.     He has bottle in front of him and confirmed.   Patient does state he prefers to take the once a day medication so he can take once and not cut the tablet if possible.     Advised would send message to Dr. Sallyanne Kuster to see if lopressor can be transitioned to Toprol.   If so-will need new rx.    [facility] states if new rx sent-in notes state "in Reference to # [id number]" (may need to call to clarify/confirm).    Advised patient would call back to make him aware as well.

## 2021-11-18 MED ORDER — METOPROLOL SUCCINATE ER 25 MG PO TB24: 25.0000 mg | ORAL_TABLET | Freq: Every day | ORAL | 3 refills | Status: DC

## 2021-11-18 NOTE — Telephone Encounter (Signed)
Called the patient and informed him of the change to Metoprolol Succinate 25 mg once daily.  Call placed to [facility] to inform them of the change as well. They have discontinued the prior prescription

## 2021-11-19 ENCOUNTER — Telehealth: Payer: Self-pay | Admitting: Licensed Clinical Social Worker

## 2021-11-19 NOTE — Telephone Encounter (Signed)
H&V Care Navigation CSW Progress Note  Clinical Social Worker contacted patient by phone to f/u and see if he has received paperwork from [place]. No answer at [phone], left voicemail requesting call back. Will re-attempt as able.   Patient is participating in a Managed Medicaid Plan:  No, UHC Medicare only.   SDOH Screenings   Transportation Needs: Unmet Transportation Needs (10/14/2021)  Tobacco Use: Low Risk  (10/16/2021)   Westley Hummer, MSW, LCSW Clinical Social Worker II [place]  [phone]- work cell phone (preferred) [phone]- desk phone

## 2021-11-20 ENCOUNTER — Telehealth: Payer: Self-pay | Admitting: Licensed Clinical Social Worker

## 2021-11-20 NOTE — Telephone Encounter (Signed)
H&V Care Navigation CSW Progress Note  Clinical Social Worker contacted patient by phone to f/u and see if he has received paperwork from [place]. I was able tor each him this morning at [phone], he shares he hasnt checked his mail since Wednesday as he relies on others to help him with getting it. He will try and have someone come and help him check this weekend. I will f/u next week to assist when he receives letter with scheduling a ride.    Patient is participating in a Managed Medicaid Plan:  No, UHC Medicare only.   SDOH Screenings   Transportation Needs: Unmet Transportation Needs (10/14/2021)  Tobacco Use: Low Risk  (10/16/2021)   Westley Hummer, MSW, LCSW Clinical Social Worker II [place]  [phone]- work cell phone (preferred) [phone]- desk phone

## 2021-11-25 ENCOUNTER — Telehealth: Payer: Self-pay | Admitting: Licensed Clinical Social Worker

## 2021-11-25 NOTE — Telephone Encounter (Signed)
H&V Care Navigation CSW Progress Note  Clinical Social Worker  was contacted by pt  to f/u on transportation. Called pt back at [phone], he confirms he has received his benefit letter stating he has been deemed permanently eligible for door to door transportation through [place]. Pt worried he cant use it for his appt next week, confused how to pay etc. Together we called [facility] and spoke with operator. Pt will need to contact customer service to purchase a punch card or bring exact change ($2.50/way) for appt.  Pt provided with information on number to call for reservations and customer service. Pt will practice calling to schedule a ride tomorrow and let me know if any issues. No additional questions at this time.   Patient is participating in a Managed Medicaid Plan:  No, [place] Medicare  SDOH Screenings   Transportation Needs: Unmet Transportation Needs (10/14/2021)  Tobacco Use: Low Risk  (10/16/2021)    Westley Hummer, MSW, [place]  [phone]- work cell phone (preferred) [phone]- desk phone

## 2021-11-26 ENCOUNTER — Telehealth: Payer: Self-pay | Admitting: Licensed Clinical Social Worker

## 2021-11-26 NOTE — Telephone Encounter (Signed)
H&V Care Navigation CSW Progress Note  Clinical Social Worker contacted patient by phone to f/u on transportation. Was able to reach him at [phone]. Pt confirms he was able to arrange a ride through [place]. He is concerned about the timing but thinks it should work, added this to the appt notes. I remain available, no additional questions at this time.   Patient is participating in a Managed Medicaid Plan:  No, UHC Medicare only.   SDOH Screenings   Transportation Needs: Unmet Transportation Needs (10/14/2021)  Tobacco Use: Low Risk  (10/16/2021)    Westley Hummer, MSW, LCSW Clinical Social Worker II [place]  [phone]- work cell phone (preferred) [phone]- desk phone

## 2021-12-02 ENCOUNTER — Ambulatory Visit: Payer: Medicare Other | Attending: Cardiovascular Disease | Admitting: Cardiovascular Disease

## 2021-12-02 ENCOUNTER — Encounter: Payer: Self-pay | Admitting: Cardiovascular Disease

## 2021-12-02 VITALS — BP 124/84 | HR 94 | Ht 68.0 in | Wt 374.0 lb

## 2021-12-02 DIAGNOSIS — G4733 Obstructive sleep apnea (adult) (pediatric): Secondary | ICD-10-CM

## 2021-12-02 DIAGNOSIS — I5022 Chronic systolic (congestive) heart failure: Secondary | ICD-10-CM | POA: Diagnosis not present

## 2021-12-02 DIAGNOSIS — I4821 Permanent atrial fibrillation: Secondary | ICD-10-CM | POA: Diagnosis not present

## 2021-12-02 DIAGNOSIS — S78112A Complete traumatic amputation at level between left hip and knee, initial encounter: Secondary | ICD-10-CM

## 2021-12-02 DIAGNOSIS — D6869 Other thrombophilia: Secondary | ICD-10-CM

## 2021-12-02 DIAGNOSIS — Z6841 Body Mass Index (BMI) 40.0 and over, adult: Secondary | ICD-10-CM

## 2021-12-02 DIAGNOSIS — T8740 Infection of amputation stump, unspecified extremity: Secondary | ICD-10-CM

## 2021-12-02 DIAGNOSIS — Z89612 Acquired absence of left leg above knee: Secondary | ICD-10-CM

## 2021-12-02 MED ORDER — METOPROLOL SUCCINATE ER 25 MG PO TB24: 37.5000 mg | ORAL_TABLET | Freq: Every day | ORAL | 3 refills | Status: DC
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
## 2021-12-02 NOTE — Patient Instructions (Addendum)
Medication Instructions:  INCREASE the Metoprolol Succinate to 37.5 mg once daily (a tablet and a half)  *If you need a refill on your cardiac medications before your next appointment, please call your pharmacy*   Lab Work: None ordered If you have labs (blood work) drawn today and your tests are completely normal, you will receive your results only by: [place] (if you have MyChart) OR A paper copy in the mail If you have any lab test that is abnormal or we need to change your treatment, we will call you to review the results.   Testing/Procedures: A referral has been placed to Dr. Radford Pax for sleep   Follow-Up: At [facility], you and your health needs are our priority.  As part of our continuing mission to provide you with exceptional heart care, we have created designated Provider Care Teams.  These Care Teams include your primary Cardiologist (physician) and Advanced Practice Providers (APPs -  Physician Assistants and Nurse Practitioners) who all work together to provide you with the care you need, when you need it.  We recommend signing up for the patient portal called "MyChart".  Sign up information is provided on this After Visit Summary.  MyChart is used to connect with patients for Virtual Visits (Telemedicine).  Patients are able to view lab/test results, encounter notes, upcoming appointments, etc.  Non-urgent messages can be sent to your provider as well.   To learn more about what you can do with MyChart, go to [email or web address].    Your next appointment:   6 month(s)  The format for your next appointment:   In Person  Provider:   Sanda Klein, MD

## 2021-12-03 ENCOUNTER — Emergency Department (HOSPITAL_COMMUNITY)
Admission: EM | Admit: 2021-12-03 | Discharge: 2021-12-03 | Disposition: A | Discharge status: Home / Self Care | Payer: Medicare Other | Attending: Emergency Medicine | Admitting: Emergency Medicine

## 2021-12-03 ENCOUNTER — Encounter (HOSPITAL_COMMUNITY): Payer: Self-pay | Admitting: Oncology

## 2021-12-03 ENCOUNTER — Other Ambulatory Visit: Payer: Self-pay

## 2021-12-03 ENCOUNTER — Emergency Department (HOSPITAL_COMMUNITY): Payer: Medicare Other

## 2021-12-03 DIAGNOSIS — Z7901 Long term (current) use of anticoagulants: Secondary | ICD-10-CM | POA: Insufficient documentation

## 2021-12-03 DIAGNOSIS — Z96653 Presence of artificial knee joint, bilateral: Secondary | ICD-10-CM | POA: Insufficient documentation

## 2021-12-03 DIAGNOSIS — E039 Hypothyroidism, unspecified: Secondary | ICD-10-CM | POA: Insufficient documentation

## 2021-12-03 DIAGNOSIS — I509 Heart failure, unspecified: Secondary | ICD-10-CM | POA: Diagnosis not present

## 2021-12-03 DIAGNOSIS — Z4801 Encounter for change or removal of surgical wound dressing: Secondary | ICD-10-CM | POA: Diagnosis present

## 2021-12-03 DIAGNOSIS — L0291 Cutaneous abscess, unspecified: Secondary | ICD-10-CM

## 2021-12-03 LAB — CBC WITH DIFFERENTIAL/PLATELET
Abs Immature Granulocytes: 0 10*3/uL (ref 0.00–0.07)
Basophils Absolute: 0 10*3/uL (ref 0.0–0.1)
Basophils Relative: 1 %
Eosinophils Absolute: 0.1 10*3/uL (ref 0.0–0.5)
Eosinophils Relative: 4 %
HCT: 35.4 % — ABNORMAL LOW (ref 39.0–52.0)
Hemoglobin: 10.6 g/dL — ABNORMAL LOW (ref 13.0–17.0)
Immature Granulocytes: 0 %
Lymphocytes Relative: 26 %
Lymphs Abs: 1 10*3/uL (ref 0.7–4.0)
MCH: 31.1 pg (ref 26.0–34.0)
MCHC: 29.9 g/dL — ABNORMAL LOW (ref 30.0–36.0)
MCV: 103.8 fL — ABNORMAL HIGH (ref 80.0–100.0)
Monocytes Absolute: 0.4 10*3/uL (ref 0.1–1.0)
Monocytes Relative: 9 %
Neutro Abs: 2.3 10*3/uL (ref 1.7–7.7)
Neutrophils Relative %: 60 %
Platelets: 190 10*3/uL (ref 150–400)
RBC: 3.41 MIL/uL — ABNORMAL LOW (ref 4.22–5.81)
RDW: 15.6 % — ABNORMAL HIGH (ref 11.5–15.5)
WBC: 3.8 10*3/uL — ABNORMAL LOW (ref 4.0–10.5)
nRBC: 0 % (ref 0.0–0.2)

## 2021-12-03 LAB — BASIC METABOLIC PANEL
Anion gap: 6 (ref 5–15)
BUN: 14 mg/dL (ref 8–23)
CO2: 24 mmol/L (ref 22–32)
Calcium: 9.1 mg/dL (ref 8.9–10.3)
Chloride: 110 mmol/L (ref 98–111)
Creatinine, Ser: 0.97 mg/dL (ref 0.61–1.24)
GFR, Estimated: >60 mL/min (ref >60)
Glucose, Bld: 90 mg/dL (ref 70–99)
Potassium: 3.9 mmol/L (ref 3.5–5.1)
Sodium: 140 mmol/L (ref 135–145)

## 2021-12-03 LAB — SEDIMENTATION RATE: Sed Rate: 32 mm/hr — ABNORMAL HIGH (ref 0–16)

## 2021-12-03 MED ORDER — DOXYCYCLINE HYCLATE 100 MG PO TABS
100.0000 mg | ORAL_TABLET | Freq: Once | ORAL | Status: AC
Start: 2021-12-03 — End: 2021-12-03
  Administered 2021-12-03: 100 mg via ORAL
  Filled 2021-12-03: qty 1

## 2021-12-03 MED ORDER — DOXYCYCLINE HYCLATE 100 MG PO CAPS: 100.0000 mg | ORAL_CAPSULE | Freq: Two times a day (BID) | ORAL | 0 refills | Status: DC

## 2021-12-03 NOTE — ED Notes (Signed)
An After Visit Summary was printed and given to the patient. Discharge instructions given and no further questions at this time.  

## 2021-12-03 NOTE — ED Triage Notes (Signed)
Pt bib PTAR d/t new wound on his left AKA.

## 2021-12-03 NOTE — ED Notes (Signed)
ED Provider at bedside. 

## 2021-12-03 NOTE — ED Notes (Signed)
Patient verbalizes understanding of discharge instructions. Opportunity for questioning and answers were provided. Armband removed by staff, pt discharged from ED. [facility] has arrived for transport home. All belongings/AVS summary sent with patient for d/c.

## 2021-12-03 NOTE — ED Notes (Signed)
Pt requesting PTAR, PTAR called.

## 2021-12-03 NOTE — ED Provider Notes (Signed)
[facility] DEPT Provider Note   CSN: [id number] Arrival date & time: 12/03/21  1001     History  Chief Complaint  Patient presents with   Wound Check    [name] is a 71 y.o. male.   Wound Check  Patient presents with wound on left above-the-knee amputation stump.  Has had problems with this in the past.  Has had more drainage.  States it has been pus coming out.  No fevers.  Has had 17 previous surgeries on here.  Is on anticoagulation due to atrial fibrillation.  Sees Dr. Sharol Given for the leg.  Has had previous admissions to hospital and had fluid collection that had IR drainage.  However it appears that the culture of the fluid did not grow out anything.    Past Medical History:  Diagnosis Date   CHF (congestive heart failure) ([place]) 05/2019   Chronic anticoagulation    with Xarelto   Complication of anesthesia    PT STATES HARD TO WAKE UP AFTER ONE SUGERY -STATES THE SURGERY TOOK LONGER THAN EXPECTED.  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   Shortness of breath    WITH EXERTION AND PAIN   Sleep apnea    uses CPAP,   Past Surgical History:  Procedure Laterality Date   2010 REMOVAL OF LEFT TOTAL KNEE     AMPUTATION Left 11/17/2019   Procedure: AMPUTATION ABOVE KNEE;  Surgeon: Newt Minion, MD;  Location: [facility] [place];  Service: Orthopedics;  Laterality: Left;   CARDIAC CATHETERIZATION  2006   CARDIOVERSION N/A 08/28/2012   Procedure: CARDIOVERSION;  Surgeon: Sanda Klein, MD;  Location: [place];  Service: Cardiovascular;   Laterality: N/A;   CARDIOVERSION N/A 09/01/2012   Procedure: CARDIOVERSION;  Surgeon: Pixie Casino, MD;  Location: [place];  Service: Cardiovascular;  Laterality: N/A;   COLONOSCOPY N/A 06/13/2013   Procedure: COLONOSCOPY;  Surgeon: Inda Castle, MD;  [facility];  Service: Endoscopy;  Laterality: N/A;   EXCISIONAL TOTAL KNEE ARTHROPLASTY WITH ANTIBIOTIC SPACERS Left 09/21/2018   Procedure: Resection of tibia versus both components with placement of antibiotic spacer;  Surgeon: Paralee Cancel, MD;  [facility];  Service: Orthopedics;  Laterality: Left;  2.5 hrs   EXCISIONAL TOTAL KNEE ARTHROPLASTY WITH ANTIBIOTIC SPACERS Left 01/02/2019   Procedure: Repeat Washout and placement of antibiotic spacer left knee;  Surgeon: Paralee Cancel, MD;  [facility];  Service: Orthopedics;  Laterality: Left;  2 hrs   HYDROCELECTOY   2012   I & D EXTREMITY Left 08/14/2019   Procedure: IRRIGATION AND DEBRIDEMENT LEFT KNEE WOUND;  Surgeon: Paralee Cancel, MD;  [facility];  Service: Orthopedics;  Laterality: Left;  60 mins   I & D KNEE WITH POLY EXCHANGE Left 05/17/2019   Procedure: IRRIGATION AND DEBRIDEMENT KNEE WITH POLY EXCHANGE;  Surgeon: Paralee Cancel, MD;  [facility];  Service: Orthopedics;  Laterality: Left;  90 mins  IR US GUIDE BX ASP/DRAIN  09/17/2021   IRRIGATION AND DEBRIDEMENT KNEE Left 04/03/2019   Procedure: REIMPLANTATION OF LEFT KNEE TIBIAL COMPONENT.;  Surgeon: Paralee Cancel, MD;  [facility];  Service: Orthopedics;  Laterality: Left;  need 120 min   IRRIGATION AND DEBRIDEMENT KNEE Left 11/06/2019   Procedure: IRRIGATION AND DEBRIDEMENT KNEE;  Surgeon: Paralee Cancel, MD;  [facility];  Service: Orthopedics;  Laterality: Left;  90 mins   LEFT KNEE ARTHROSCOPY  1999   LEFT KNEE SURGERY UPPER TIBIAL OSTOMY     LEFT TOTAL KNEE REMOVAL FOR INFECTION  2006   REIMPLANTATION LEFT TOTAL KNEE  2008   REIMPLANTATION LEFT TOTAL KNEE   2006   REIMPLANTATION OF TOTAL KNEE  Left 06/23/2012   Procedure: REIMPLANTATION OF LEFT TOTAL KNEE;  Surgeon: Mauri Pole, MD;  [facility];  Service: Orthopedics;  Laterality: Left;   REMOVAL LEFT TOTAL KNEE   2008   REPLACEMENT LEFT KNEE  2002   REPLACEMENT RIGHT KNEE  2003   REVISION LEFT KNEE CAP   2004   RIGHT KNEE ARTHROSCOPY  1998   TEE WITHOUT CARDIOVERSION N/A 08/28/2012   Procedure: TRANSESOPHAGEAL ECHOCARDIOGRAM (TEE);  Surgeon: Sanda Klein, MD;  Location: [facility];  Service: Cardiovascular;  Laterality: N/A;   TONSILLECTOMY     TOTAL KNEE REVISION Left 12/15/2015   Procedure: TOTAL KNEE REVISION REPLACEMENT;  Surgeon: Paralee Cancel, MD;  [facility];  Service: Orthopedics;  Laterality: Left;  Adductor Block     Home Medications Prior to Admission medications   Medication Sig Start Date End Date Taking? Authorizing Provider  doxycycline (VIBRAMYCIN) 100 MG capsule Take 1 capsule (100 mg total) by mouth 2 (two) times daily. 12/03/21  Yes Davonna Belling, MD  acetaminophen (TYLENOL) 500 MG tablet Take 500 mg by mouth every 6 (six) hours as needed for mild pain or fever. Patient not taking: Reported on 12/02/2021    [provider]  apixaban (ELIQUIS) 5 MG TABS tablet Take 1 tablet (5 mg total) by mouth 2 (two) times daily. 10/06/21   Croitoru, Mihai, MD  DESITIN 13 % CREA Apply 1 Application topically 2 (two) times daily as needed. Patient not taking: Reported on 12/02/2021 09/07/21   Wynona Dove A, DO  furosemide (LASIX) 20 MG tablet Take 1 tablet (20 mg total) by mouth daily. 10/26/21   Croitoru, Mihai, MD  HYDROcodone-acetaminophen (NORCO/VICODIN) 5-325 MG tablet Take 1-2 tablets by mouth every 4 (four) hours as needed for moderate pain. Patient not taking: Reported on 12/02/2021 09/22/21   Georgette Shell, MD  levothyroxine (SYNTHROID) 125 MCG tablet Take 125 mcg by mouth daily. 10/30/21   [provider]  methocarbamol (ROBAXIN) 500 MG tablet Take 500 mg by mouth every 6 (six)  hours as needed. Patient not taking: Reported on 12/02/2021 09/25/21   [provider]  metoprolol succinate (TOPROL-XL) 25 MG 24 hr tablet Take 1.5 tablets (37.5 mg total) by mouth daily. Take with or immediately following a meal. 12/02/21 11/27/22  Croitoru, Mihai, MD  naphazoline-glycerin (CLEAR EYES REDNESS) 0.012-0.2 % SOLN Place 1-2 drops into both eyes 4 (four) times daily as needed for eye irritation. Patient not taking: Reported on 12/02/2021 11/21/19   Persons, Bevely Palmer, [place]  potassium chloride (KLOR-CON M) 10 MEQ tablet Take 1 tablet (10 mEq total) by mouth daily. 10/26/21   Croitoru, Dani Gobble, MD      Allergies    Morphine    Review of Systems   Review  of Systems  Physical Exam Updated Vital Signs BP 122/84   Pulse 88   Temp 98.2 F (36.8 C) (Oral)   Resp 14   Ht '5\' 8"'$  (1.727 m)   Wt (!) 169.2 kg   SpO2 95%   BMI 56.71 kg/m  Physical Exam Vitals and nursing note reviewed.  HENT:     Head: Atraumatic.  Cardiovascular:     Rate and Rhythm: Normal rate.  Musculoskeletal:     Comments: Previous left above-the-knee amputation.  On the more medial aspect of the wound there is an area of granulation tissue with a bandage over it that does have some purulent drainage on the dressing.  No drainage with palpation.  No real erythema.  No fluctuance.  Neurological:     Mental Status: He is alert.      ED Results / Procedures / Treatments   Labs (all labs ordered are listed, but only abnormal results are displayed) Labs Reviewed  CBC WITH DIFFERENTIAL/PLATELET - Abnormal; Notable for the following components:      Result Value   WBC 3.8 (*)    RBC 3.41 (*)    Hemoglobin 10.6 (*)    HCT 35.4 (*)    MCV 103.8 (*)    MCHC 29.9 (*)    RDW 15.6 (*)    All other components within normal limits  SEDIMENTATION RATE - Abnormal; Notable for the following components:   Sed Rate 32 (*)    All other components within normal limits  BASIC METABOLIC PANEL  C-REACTIVE  PROTEIN    EKG None  Radiology DG Femur Min 2 Views Left  Result Date: 12/03/2021 CLINICAL DATA:  Wound infection. Draining fluid and pus from site of previous amputation. EXAM: LEFT FEMUR 2 VIEWS COMPARISON:  Radiographs 09/14/2021 and 08/14/2021.  CT 09/16/2021. FINDINGS: Status post above the knee amputation with retained femoral stem hardware within the distal femoral diaphysis. The distal femur has a stable appearance with stable cortical thickening and adjacent heterotopic ossification. No progressive bone destruction identified. The surrounding soft tissues are unchanged without evidence of foreign body or definite soft tissue emphysema. The proximal femur is suboptimally visualized due to body habitus, although appears unchanged. IMPRESSION: Stable appearance of the left femur following above the knee amputation. No radiographic evidence of osteomyelitis. Electronically Signed   By: Richardean Sale M.D.   On: 12/03/2021 12:44    Procedures Procedures    Medications Ordered in ED Medications - No data to display  ED Course/ Medical Decision Making/ A&P                           Medical Decision Making Amount and/or Complexity of Data Reviewed Labs: ordered. Radiology: ordered.  Risk Prescription drug management.   Patient with new drainage from wound on left above-the-knee amputation.  Amputation done 2 years ago.  States there is now more drainage.  I am not able to actively express any drainage but did have some purulence on the dressing.  We will get basic blood work sed rate and CRP.  We will also get x-ray.   sed rate mildly elevated at 30.  Down from recent admission when he had the infection.  White count is just slightly up from baseline.  Doubt a sepsis at this time.  Discussed with pharmacist and will give doxycycline.  Has had MRSA in the past.  Do not think we need repeat CT or MRI at this time  to evaluate.  We will follow-up with PCP or Dr. Sharol Given.  Discharge  home.       Final Clinical Impression(s) / ED Diagnoses Final diagnoses:  Abscess    Rx / DC Orders ED Discharge Orders          Ordered    doxycycline (VIBRAMYCIN) 100 MG capsule  2 times daily        12/03/21 1517              Davonna Belling, MD 12/03/21 1525

## 2021-12-04 ENCOUNTER — Other Ambulatory Visit: Payer: Self-pay | Admitting: Cardiovascular Disease

## 2021-12-04 NOTE — Telephone Encounter (Signed)
Prescription refill request for Eliquis received.  Indication: afib  Last office visit: 12/02/2021, Croitoru Scr: 0.97, 12/03/2021 Age: 71 yo Weight:  169.2kg   Refill snet.

## 2021-12-28 ENCOUNTER — Other Ambulatory Visit: Payer: Self-pay | Admitting: Pharmacist

## 2021-12-28 DIAGNOSIS — I4819 Other persistent atrial fibrillation: Secondary | ICD-10-CM

## 2021-12-28 MED ORDER — APIXABAN 5 MG PO TABS: 5.0000 mg | ORAL_TABLET | Freq: Two times a day (BID) | ORAL | 3 refills | Status: DC

## 2022-02-15 ENCOUNTER — Ambulatory Visit: Payer: Medicare Other | Admitting: Cardiovascular Disease

## 2022-02-25 ENCOUNTER — Telehealth: Payer: Self-pay

## 2022-02-25 ENCOUNTER — Telehealth: Payer: Self-pay | Admitting: Orthopedic Surgery

## 2022-02-25 ENCOUNTER — Other Ambulatory Visit: Payer: Self-pay | Admitting: Orthopedic Surgery

## 2022-02-25 MED ORDER — DOXYCYCLINE HYCLATE 100 MG PO CAPS: 100.0000 mg | ORAL_CAPSULE | Freq: Two times a day (BID) | ORAL | 0 refills | Status: DC

## 2022-02-25 NOTE — Telephone Encounter (Signed)
Called pt. He wants to use the [facility] on [address]. Sent the rx in chart for doxycycline to the pharmacy

## 2022-02-25 NOTE — Telephone Encounter (Signed)
Called pt. He is unable to come in today due to transportation. I worked him in to see [employer] tomorrow morning

## 2022-02-25 NOTE — Telephone Encounter (Signed)
Patient has an abscess and it is leaking infection. Call sent to triage.Marland Kitchen

## 2022-02-25 NOTE — Telephone Encounter (Signed)
Pt called and stated his abscess isnt getting any better. His pcp placed him on sulfamethoxazole-trimethoprim on 02/03/22. Pt stated he is down to his last pill and wanted to know what to do. Please advise

## 2022-02-25 NOTE — Telephone Encounter (Signed)
Message sent to Dr. Sharol Given. Is on an antibiotic

## 2022-02-25 NOTE — Addendum Note (Signed)
Addended by: Robyne Peers on: 02/25/2022 10:55 AM   Modules accepted: Orders

## 2022-02-26 ENCOUNTER — Telehealth: Payer: Self-pay | Admitting: Family

## 2022-02-26 ENCOUNTER — Encounter: Payer: Self-pay | Admitting: Family

## 2022-02-26 ENCOUNTER — Ambulatory Visit: Payer: Medicare Other | Admitting: Family

## 2022-02-26 DIAGNOSIS — Z89612 Acquired absence of left leg above knee: Secondary | ICD-10-CM

## 2022-02-26 DIAGNOSIS — S78112S Complete traumatic amputation at level between left hip and knee, sequela: Secondary | ICD-10-CM

## 2022-02-26 MED ORDER — SULFAMETHOXAZOLE-TRIMETHOPRIM 800-160 MG PO TABS: 1.0000 | ORAL_TABLET | Freq: Two times a day (BID) | ORAL | 0 refills | Status: DC

## 2022-02-26 NOTE — Telephone Encounter (Signed)
Spoke with pharmacist and gave Rx info agian

## 2022-02-26 NOTE — Telephone Encounter (Signed)
[facility] and nothing is there. Please advise

## 2022-02-26 NOTE — Progress Notes (Signed)
Office Visit Note   Patient: [name]           Date of Birth: [birth year]           MRN: [id number] Visit Date: 02/26/2022              Requested by: Bernerd Limbo, MD [address],  [place] [id number] PCP: Bernerd Limbo, MD  Chief Complaint  Patient presents with   Left Leg - Follow-up      HPI: The patient is a 72 year old gentleman who is seen today for concern of drainage from his left above-knee amputation.  Amputation was 2 years and 3 months ago.  He has had intermittent issues with drainage from his amputation stump today this area is dry  No concerns of pain or swelling or redness today  Ultimately he feels he may require revision above-knee amputation to remove remaining hardware from his femur  Has not been able to use a prosthesis  Assessment & Plan: Visit Diagnoses: No diagnosis found.  Plan: Given a course of Bactrim he will follow-up with Dr. Sharol Given a few days after the Bactrim has completed may discuss surgical options at that time.  Patient aware of return precautions  Follow-Up Instructions: No follow-ups on file.   Ortho Exam  Patient is alert, oriented, no adenopathy, well-dressed, normal affect, normal respiratory effort. On examination left above-knee amputation this is well-healed there is 1 area today with some eschar this is 3 mm in diameter there is no erythema warmth no palpable fluctuance  not able to express any drainage  Imaging: No results found. No images are attached to the encounter.  Labs: Lab Results  Component Value Date   HGBA1C 4.9 09/14/2021   HGBA1C 5.3 08/15/2020   HGBA1C 5.5 11/02/2019   ESRSEDRATE 32 (H) 12/03/2021   ESRSEDRATE 126 (H) 09/19/2021   ESRSEDRATE 130 (H) 09/18/2021   CRP 6.4 (H) 09/19/2021   CRP 12.0 (H) 09/18/2021   CRP 20.4 (H) 09/17/2021   REPTSTATUS 09/22/2021 FINAL 09/17/2021   GRAMSTAIN  09/17/2021    FEW WBC PRESENT, PREDOMINANTLY PMN NO ORGANISMS SEEN    CULT   09/17/2021    No growth aerobically or anaerobically. Performed at [facility], [place] [address], [place], [place] [address]    LABORGA PROTEUS MIRABILIS (A) 03/19/2021     Lab Results  Component Value Date   ALBUMIN 2.3 (L) 09/22/2021   ALBUMIN 2.1 (L) 09/19/2021   ALBUMIN 2.0 (L) 09/18/2021    Lab Results  Component Value Date   MG 1.7 09/14/2021   MG 2.2 04/23/2021   MG 2.4 04/22/2021   No results found for: "VD25OH"  No results found for: "PREALBUMIN"    Latest Ref Rng & Units 12/03/2021   11:59 AM 09/22/2021    3:38 AM 09/19/2021    3:13 AM  CBC EXTENDED  WBC 4.0 - 10.5 K/uL 3.8  2.9  2.8   RBC 4.22 - 5.81 MIL/uL 3.41  3.06  2.99   Hemoglobin 13.0 - 17.0 g/dL 10.6  9.5  9.4   HCT 39.0 - 52.0 % 35.4  31.8  31.3   Platelets 150 - 400 K/uL 190  260  229   NEUT# 1.7 - 7.7 K/uL 2.3     Lymph# 0.7 - 4.0 K/uL 1.0        There is no height or weight on file to calculate BMI.  Orders:  No orders of the defined  types were placed in this encounter.  Meds ordered this encounter  Medications   sulfamethoxazole-trimethoprim (BACTRIM DS) 800-160 MG tablet    Sig: Take 1 tablet by mouth 2 (two) times daily.    Dispense:  20 tablet    Refill:  0     Procedures: No procedures performed  Clinical Data: No additional findings.  ROS:  All other systems negative, except as noted in the HPI. Review of Systems  Constitutional:  Negative for chills and fever.  Skin:  Positive for wound. Negative for color change.    Objective: Vital Signs: There were no vitals taken for this visit.  Specialty Comments:  No specialty comments available.  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   Hypokalemia 09/14/2021   Left leg cellulitis 48/54/6270   Chronic systolic CHF (congestive heart failure) ([place]) 09/14/2021   Fall at home, initial encounter 09/14/2021   Acute on chronic combined systolic and  diastolic CHF (congestive heart failure) ([place]) 04/17/2021   Physical deconditioning 04/17/2021   Cellulitis of groin, right 04/14/2021   Pressure injury of skin 04/14/2021   Acute respiratory failure with hypoxia ([place]) 01/19/2021   Hypothyroidism    Macrocytic anemia    Morbid obesity with BMI of 60.0-69.9, adult ([place])    Pneumonia due to COVID-19 virus 01/15/2021   Cellulitis of left leg    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[place])    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   Septic joint of left knee joint ([place]) 11/06/2019   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[place]) 08/15/2019   Open knee wound 08/14/2019   S/P left TK revision 04/03/2019   Administration of long-term prophylactic antibiotics    History of streptococcal infection    History of DVT (deep vein thrombosis)    S/P rev left TK 12/15/2015   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   Preoperative cardiovascular examination 04/17/2013   OSA (obstructive sleep apnea)-3 L of oxygen at night and not CPAP 11/27/2012   Chronic anticoagulation, with Xarelto 09/10/2012   DVT (deep venous thrombosis), possible 09/10/2012   SOB (shortness of breath) 08/26/2012   Chest discomfort 08/26/2012   Persistent atrial fibrillation ([place]) 08/26/2012   Super obesity 06/27/2012   Expected blood loss anemia 06/27/2012   S/P left TK revision 06/23/2012   Septic arthritis of knee ([place])    Cellulitis 09/19/2010   METHICILLIN RESISTANT STAPHYLOCOCCUS AUREUS INFECTION 09/10/2009   STREPTOCOCCUS INFECTION CCE & UNS SITE GROUP C 07/04/2008   CHRONIC KIDNEY DISEASE UNSPECIFIED 07/04/2008   INFECTION DUE TO INTERNAL ORTH DEVICE NEC 03/02/2006   Past Medical History:  Diagnosis Date   CHF (congestive heart failure) ([place]) 05/2019   Chronic anticoagulation    with Xarelto   Complication of anesthesia    PT STATES HARD TO WAKE UP AFTER ONE SUGERY -[place].  NO PROBLEMS WITH ANY OTHER SURGERY    Complication of anesthesia    in 12/2018-states started  shaking after surgery, could not get warm    Dysrhythmia    A-fib   GERD (gastroesophageal reflux disease)    History of blood clots    History of blood transfusion    Hypothyroidism    Pain    BACK PAIN - PT ATTRIBUTES TO THE WAY HE WALKS DUE TO LEFT KNEE PROBLEM   Persistent atrial fibrillation (H[facility])    Septic arthritis of knee ([place])  LEFT KNEE   Shortness of breath    WITH EXERTION AND PAIN   Sleep apnea    uses CPAP,    Family History  Problem Relation Age of Onset   Heart disease Father 38   Pancreatic cancer Sister    Liver cancer Sister    Cervical cancer Sister    Breast cancer Sister    Diabetes Sister    Colon cancer Neg Hx    Throat cancer Neg Hx    Stomach cancer Neg Hx    Kidney disease Neg Hx    Liver disease Neg Hx     Past Surgical History:  Procedure Laterality Date   2010 REMOVAL OF LEFT TOTAL KNEE     AMPUTATION Left 11/17/2019   Procedure: AMPUTATION ABOVE KNEE;  Surgeon: Newt Minion, MD;  Location: [facility];  Service: Orthopedics;  Laterality: Left;   CARDIAC CATHETERIZATION  2006   CARDIOVERSION N/A 08/28/2012   Procedure: CARDIOVERSION;  Surgeon: Sanda Klein, MD;  Location: [facility];  Service: Cardiovascular;  Laterality: N/A;   CARDIOVERSION N/A 09/01/2012   Procedure: CARDIOVERSION;  Surgeon: Pixie Casino, MD;  Location: [facility];  Service: Cardiovascular;  Laterality: N/A;   COLONOSCOPY N/A 06/13/2013   Procedure: COLONOSCOPY;  Surgeon: Inda Castle, MD;  [facility];  Service: Endoscopy;  Laterality: N/A;   EXCISIONAL TOTAL KNEE ARTHROPLASTY WITH ANTIBIOTIC SPACERS Left 09/21/2018   Procedure: Resection of tibia versus both components with placement of antibiotic spacer;  Surgeon: Paralee Cancel, MD;  [facility];  Service: Orthopedics;  Laterality: Left;  2.5 hrs   EXCISIONAL TOTAL KNEE ARTHROPLASTY WITH ANTIBIOTIC SPACERS Left 01/02/2019   Procedure: Repeat Washout and  placement of antibiotic spacer left knee;  Surgeon: Paralee Cancel, MD;  [facility];  Service: Orthopedics;  Laterality: Left;  2 hrs   HYDROCELECTOY   2012   I & D EXTREMITY Left 08/14/2019   Procedure: IRRIGATION AND DEBRIDEMENT LEFT KNEE WOUND;  Surgeon: Paralee Cancel, MD;  [facility];  Service: Orthopedics;  Laterality: Left;  60 mins   I & D KNEE WITH POLY EXCHANGE Left 05/17/2019   Procedure: IRRIGATION AND DEBRIDEMENT KNEE WITH POLY EXCHANGE;  Surgeon: Paralee Cancel, MD;  [facility];  Service: Orthopedics;  Laterality: Left;  90 mins   IR US GUIDE BX ASP/DRAIN  09/17/2021   IRRIGATION AND DEBRIDEMENT KNEE Left 04/03/2019   Procedure: REIMPLANTATION OF LEFT KNEE TIBIAL COMPONENT.;  Surgeon: Paralee Cancel, MD;  [facility];  Service: Orthopedics;  Laterality: Left;  need 120 min   IRRIGATION AND DEBRIDEMENT KNEE Left 11/06/2019   Procedure: IRRIGATION AND DEBRIDEMENT KNEE;  Surgeon: Paralee Cancel, MD;  [facility];  Service: Orthopedics;  Laterality: Left;  90 mins   LEFT KNEE ARTHROSCOPY  1999   LEFT KNEE SURGERY UPPER TIBIAL OSTOMY     LEFT TOTAL KNEE REMOVAL FOR INFECTION  2006   REIMPLANTATION LEFT TOTAL KNEE  2008   REIMPLANTATION LEFT TOTAL KNEE   2006   REIMPLANTATION OF TOTAL KNEE Left 06/23/2012   Procedure: REIMPLANTATION OF LEFT TOTAL KNEE;  Surgeon: Mauri Pole, MD;  [facility];  Service: Orthopedics;  Laterality: Left;   REMOVAL LEFT TOTAL KNEE   2008   REPLACEMENT LEFT KNEE  2002   REPLACEMENT RIGHT KNEE  2003   REVISION LEFT KNEE CAP   2004   RIGHT KNEE ARTHROSCOPY  1998   TEE WITHOUT CARDIOVERSION N/A 08/28/2012   Procedure: TRANSESOPHAGEAL  ECHOCARDIOGRAM (TEE);  Surgeon: Sanda Klein, MD;  Location: [place];  Service: Cardiovascular;  Laterality: N/A;   TONSILLECTOMY     TOTAL KNEE REVISION Left 12/15/2015   Procedure: TOTAL KNEE REVISION REPLACEMENT;  Surgeon: Paralee Cancel, MD;  [facility];  Service: Orthopedics;  Laterality: Left;   Adductor Block   Social History   Occupational History   Not on file  Tobacco Use   Smoking status: Never   Smokeless tobacco: Never  Vaping Use   Vaping Use: Never used  Substance and Sexual Activity   Alcohol use: Not Currently    Comment: not since 1999. However, had around 6 drinks a day between 1993-1999, roughly.   Drug use: No   Sexual activity: Yes    Partners: Female

## 2022-03-15 ENCOUNTER — Encounter: Payer: Self-pay | Admitting: Orthopedic Surgery

## 2022-03-15 ENCOUNTER — Ambulatory Visit (INDEPENDENT_AMBULATORY_CARE_PROVIDER_SITE_OTHER): Payer: Medicare Other | Admitting: Orthopedic Surgery

## 2022-03-15 DIAGNOSIS — Z89612 Acquired absence of left leg above knee: Secondary | ICD-10-CM

## 2022-03-15 DIAGNOSIS — I87331 Chronic venous hypertension (idiopathic) with ulcer and inflammation of right lower extremity: Secondary | ICD-10-CM | POA: Diagnosis not present

## 2022-03-15 DIAGNOSIS — S78112S Complete traumatic amputation at level between left hip and knee, sequela: Secondary | ICD-10-CM

## 2022-03-15 NOTE — Progress Notes (Signed)
Office Visit Note   Patient: [name]           Date of Birth: 1950/08/08           MRN: [id number] Visit Date: 03/15/2022              Requested by: Bernerd Limbo, MD [place],  [place] [address] PCP: Bernerd Limbo, MD  Chief Complaint  Patient presents with   Left Leg - Wound Check      HPI: Patient is a 72 year old gentleman who is seen for evaluation for left above-knee amputation.  Patient is concerned for deep infection.  Patient has just completed a course of Bactrim DS.  He is status post left above-the-knee amputation in 2021.  Assessment & Plan: Visit Diagnoses:  1. 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 Chronic venous hypertension (idiopathic) with ulcer and inflammation of right lower extremity (HCC)     Plan: No clinical signs of infection we will follow this expectantly.  Discussed that if we had to remove that retained femoral implant that he would lose enough residual limb that it may be difficult to set.  Follow-Up Instructions: Return in about 4 weeks (around 04/12/2022).   Ortho Exam  Patient is alert, oriented, no adenopathy, well-dressed, normal affect, normal respiratory effort. Examination there is a callus over the incision of the residual limb.  There is no redness or cellulitis there is no drainage.  Patient states he did have drainage last July.  Patient has venous insufficiency of the right lower extremity but no ulcers.  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  After informed consent a 10 blade knife was used to remove the scab there is good epithelialization no cellulitis no drainage no signs of infection.  Imaging: No results found.   Labs: Lab Results  Component Value Date   HGBA1C 4.9 09/14/2021   HGBA1C 5.3 08/15/2020   HGBA1C 5.5 11/02/2019   ESRSEDRATE 32 (H) 12/03/2021   ESRSEDRATE 126 (H) 09/19/2021    ESRSEDRATE 130 (H) 09/18/2021   CRP 6.4 (H) 09/19/2021   CRP 12.0 (H) 09/18/2021   CRP 20.4 (H) 09/17/2021   REPTSTATUS 09/22/2021 FINAL 09/17/2021   GRAMSTAIN  09/17/2021    FEW WBC PRESENT, PREDOMINANTLY PMN NO ORGANISMS SEEN    CULT  09/17/2021    No growth aerobically or anaerobically. Performed at [facility], [place] [address], [place], [place] [address]    LABORGA PROTEUS MIRABILIS (A) 03/19/2021     Lab Results  Component Value Date   ALBUMIN 2.3 (L) 09/22/2021   ALBUMIN 2.1 (L) 09/19/2021   ALBUMIN 2.0 (L) 09/18/2021    Lab Results  Component Value Date   MG 1.7 09/14/2021   MG 2.2 04/23/2021   MG 2.4 04/22/2021   No results found for: "VD25OH"  No results found for: "PREALBUMIN"    Latest Ref Rng & Units 12/03/2021   11:59 AM 09/22/2021    3:38 AM 09/19/2021    3:13 AM  CBC EXTENDED  WBC 4.0 - 10.5 K/uL 3.8  2.9  2.8   RBC 4.22 - 5.81 MIL/uL 3.41  3.06  2.99   Hemoglobin 13.0 - 17.0 g/dL 10.6  9.5  9.4   HCT 39.0 - 52.0 % 35.4  31.8  31.3   Platelets 150 - 400 K/uL 190  260  229   NEUT# 1.7 - 7.7  K/uL 2.3     Lymph# 0.7 - 4.0 K/uL 1.0        There is no height or weight on file to calculate BMI.  Orders:  No orders of the defined types were placed in this encounter.  No orders of the defined types were placed in this encounter.    Procedures: No procedures performed  Clinical Data: No additional findings.  ROS:  All other systems negative, except as noted in the HPI. Review of Systems  Objective: Vital Signs: There were no vitals taken for this visit.  Specialty Comments:  No specialty comments available.  PMFS History: Patient Active Problem List   Diagnosis Date Noted   Unilateral AKA, left ([place]) 09/15/2021   Hypokalemia 09/14/2021   Left leg cellulitis 40/98/1191   Chronic systolic CHF (congestive heart failure) ([place]) 09/14/2021   Fall at home, initial encounter 09/14/2021   Acute on chronic combined systolic and  diastolic CHF (congestive heart failure) ([place]) 04/17/2021   Physical deconditioning 04/17/2021   Cellulitis of groin, right 04/14/2021   Pressure injury of skin 04/14/2021   Acute respiratory failure with hypoxia ([place]) 01/19/2021   Hypothyroidism    Macrocytic anemia    Morbid obesity with BMI of 60.0-69.9, adult ([place])    Pneumonia due to COVID-19 virus 01/15/2021   Cellulitis of left leg    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[place])    Infection of prosthetic left knee joint ([place]) 11/08/2019   Septic joint of left knee joint ([place]) 11/06/2019   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[place]) 08/15/2019   Open knee wound 08/14/2019   S/P left TK revision 04/03/2019   Administration of long-term prophylactic antibiotics    History of streptococcal infection    History of DVT (deep vein thrombosis)    S/P rev left TK 12/15/2015   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   Preoperative cardiovascular examination 04/17/2013   OSA (obstructive sleep apnea)-3 L of oxygen at night and not CPAP 11/27/2012   Chronic anticoagulation, with Xarelto 09/10/2012   DVT (deep venous thrombosis), possible 09/10/2012   SOB (shortness of breath) 08/26/2012   Chest discomfort 08/26/2012   Persistent atrial fibrillation ([place]) 08/26/2012   Super obesity 06/27/2012   Expected blood loss anemia 06/27/2012   S/P left TK revision 06/23/2012   Septic arthritis of knee ([place])    Cellulitis 09/19/2010   METHICILLIN RESISTANT STAPHYLOCOCCUS AUREUS INFECTION 09/10/2009   STREPTOCOCCUS INFECTION CCE & UNS SITE GROUP C 07/04/2008   CHRONIC KIDNEY DISEASE UNSPECIFIED 07/04/2008   INFECTION DUE TO INTERNAL ORTH DEVICE NEC 03/02/2006   Past Medical History:  Diagnosis Date   CHF (congestive heart failure) ([place]) 05/2019   Chronic anticoagulation    with Xarelto   Complication of anesthesia    PT STATES HARD TO WAKE UP AFTER ONE SUGERY -[place].  NO PROBLEMS WITH ANY OTHER SURGERY    Complication of anesthesia    in 12/2018-states started  shaking after surgery, could not get warm    Dysrhythmia    A-fib   GERD (gastroesophageal reflux disease)    History of blood clots    History of blood transfusion    Hypothyroidism    Pain    BACK PAIN - PT ATTRIBUTES TO THE WAY HE WALKS DUE TO LEFT KNEE PROBLEM   Persistent atrial fibrillation (HCC)    Septic arthritis of knee (HCC)    LEFT KNEE   Shortness of breath  WITH EXERTION AND PAIN   Sleep apnea    uses CPAP,    Family History  Problem Relation Age of Onset   Heart disease Father 59   Pancreatic cancer Sister    Liver cancer Sister    Cervical cancer Sister    Breast cancer Sister    Diabetes Sister    Colon cancer Neg Hx    Throat cancer Neg Hx    Stomach cancer Neg Hx    Kidney disease Neg Hx    Liver disease Neg Hx     Past Surgical History:  Procedure Laterality Date   2010 REMOVAL OF LEFT TOTAL KNEE     AMPUTATION Left 11/17/2019   Procedure: AMPUTATION ABOVE KNEE;  Surgeon: Newt Minion, MD;  Location: [facility];  Service: Orthopedics;  Laterality: Left;   CARDIAC CATHETERIZATION  2006   CARDIOVERSION N/A 08/28/2012   Procedure: CARDIOVERSION;  Surgeon: Sanda Klein, MD;  Location: [facility];  Service: Cardiovascular;  Laterality: N/A;   CARDIOVERSION N/A 09/01/2012   Procedure: CARDIOVERSION;  Surgeon: Pixie Casino, MD;  Location: [facility];  Service: Cardiovascular;  Laterality: N/A;   COLONOSCOPY N/A 06/13/2013   Procedure: COLONOSCOPY;  Surgeon: Inda Castle, MD;  [facility];  Service: Endoscopy;  Laterality: N/A;   EXCISIONAL TOTAL KNEE ARTHROPLASTY WITH ANTIBIOTIC SPACERS Left 09/21/2018   Procedure: Resection of tibia versus both components with placement of antibiotic spacer;  Surgeon: Paralee Cancel, MD;  [facility];  Service: Orthopedics;  Laterality: Left;  2.5 hrs   EXCISIONAL TOTAL KNEE ARTHROPLASTY WITH ANTIBIOTIC SPACERS Left 01/02/2019   Procedure: Repeat Washout and  placement of antibiotic spacer left knee;  Surgeon: Paralee Cancel, MD;  [facility];  Service: Orthopedics;  Laterality: Left;  2 hrs   HYDROCELECTOY   2012   I & D EXTREMITY Left 08/14/2019   Procedure: IRRIGATION AND DEBRIDEMENT LEFT KNEE WOUND;  Surgeon: Paralee Cancel, MD;  [facility];  Service: Orthopedics;  Laterality: Left;  60 mins   I & D KNEE WITH POLY EXCHANGE Left 05/17/2019   Procedure: IRRIGATION AND DEBRIDEMENT KNEE WITH POLY EXCHANGE;  Surgeon: Paralee Cancel, MD;  [facility];  Service: Orthopedics;  Laterality: Left;  90 mins   IR US GUIDE BX ASP/DRAIN  09/17/2021   IRRIGATION AND DEBRIDEMENT KNEE Left 04/03/2019   Procedure: REIMPLANTATION OF LEFT KNEE TIBIAL COMPONENT.;  Surgeon: Paralee Cancel, MD;  [facility];  Service: Orthopedics;  Laterality: Left;  need 120 min   IRRIGATION AND DEBRIDEMENT KNEE Left 11/06/2019   Procedure: IRRIGATION AND DEBRIDEMENT KNEE;  Surgeon: Paralee Cancel, MD;  [facility];  Service: Orthopedics;  Laterality: Left;  90 mins   LEFT KNEE ARTHROSCOPY  1999   LEFT KNEE SURGERY UPPER TIBIAL OSTOMY     LEFT TOTAL KNEE REMOVAL FOR INFECTION  2006   REIMPLANTATION LEFT TOTAL KNEE  2008   REIMPLANTATION LEFT TOTAL KNEE   2006   REIMPLANTATION OF TOTAL KNEE Left 06/23/2012   Procedure: REIMPLANTATION OF LEFT TOTAL KNEE;  Surgeon: Mauri Pole, MD;  [facility];  Service: Orthopedics;  Laterality: Left;   REMOVAL LEFT TOTAL KNEE   2008   REPLACEMENT LEFT KNEE  2002   REPLACEMENT RIGHT KNEE  2003   REVISION LEFT KNEE CAP   2004   RIGHT KNEE ARTHROSCOPY  1998   TEE WITHOUT CARDIOVERSION N/A 08/28/2012   Procedure: TRANSESOPHAGEAL ECHOCARDIOGRAM (TEE);  Surgeon: Sanda Klein, MD;  [facility];  Service: Cardiovascular;  Laterality: N/A;   TONSILLECTOMY     TOTAL KNEE REVISION Left 12/15/2015   Procedure: TOTAL KNEE REVISION REPLACEMENT;  Surgeon: Paralee Cancel, MD;  [facility];  Service: Orthopedics;  Laterality: Left;   Adductor Block   Social History   Occupational History   Not on file  Tobacco Use   Smoking status: Never   Smokeless tobacco: Never  Vaping Use   Vaping Use: Never used  Substance and Sexual Activity   Alcohol use: Not Currently    Comment: not since 1999. However, had around 6 drinks a day between 1993-1999, roughly.   Drug use: No   Sexual activity: Yes    Partners: Female

## 2022-03-22 ENCOUNTER — Telehealth: Payer: Self-pay | Admitting: Orthopedic Surgery

## 2022-03-22 ENCOUNTER — Telehealth: Payer: Self-pay

## 2022-03-22 NOTE — Telephone Encounter (Signed)
Pt hx AKA 11/2021 offered appt today and pt states that he is not able to come due to transportation. He can come tomorrow at 10:45 put on the sch with Erin.

## 2022-03-22 NOTE — Telephone Encounter (Signed)
Patient missed call he need to be seen he has drainage, he spoke to [employer]. And she was triage. Please  advise

## 2022-03-22 NOTE — Telephone Encounter (Signed)
Patient called triage. He left a message that he is experiencing drainage again and Dr.Duda told him to call if this happens. I tried to call patient back twice and left voicemail. Ask him to call back so that I can get him scheduled for a recheck.

## 2022-03-23 ENCOUNTER — Ambulatory Visit: Payer: Medicare Other | Admitting: Family

## 2022-03-23 ENCOUNTER — Encounter: Payer: Self-pay | Admitting: Family

## 2022-03-23 DIAGNOSIS — T847XXD Infection and inflammatory reaction due to other internal orthopedic prosthetic devices, implants and grafts, subsequent encounter: Secondary | ICD-10-CM | POA: Diagnosis not present

## 2022-03-23 MED ORDER — SULFAMETHOXAZOLE-TRIMETHOPRIM 800-160 MG PO TABS: 1.0000 | ORAL_TABLET | Freq: Two times a day (BID) | ORAL | 0 refills | Status: DC

## 2022-03-23 NOTE — Progress Notes (Signed)
Office Visit Note   Patient: [name]           Date of Birth: Sep 08, 1950           MRN: [id number] Visit Date: 03/23/2022              Requested by: Bernerd Limbo, MD [address] [place],  [place] [address] PCP: Bernerd Limbo, MD  Chief Complaint  Patient presents with   Left Leg - Wound Check    Hx left AKA      HPI: The patient is a 72 year old woman who presents today with return of sinus drainage since.  Patient he is unfortunately had issues with drainage and repeat infections to the left above-knee amputation for quite some time the same area has opened up again with serosanguineous drainage.  States return of his drainage after a few days after completing a course of Bactrim Patient denies fever chills Assessment & Plan: Visit Diagnoses:  1. 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 Sharol Given.  The treatment plan will be to proceed seed with hardware removal of the left femur.  Patient is in agreement with the plan he would like to wait until next week.  Have placed him back on a course of Bactrim until surgery  Follow-Up Instructions: No follow-ups on file.   Ortho Exam  Patient is alert, oriented, no adenopathy, well-dressed, normal affect, normal respiratory effort. On examination of the left thigh to the distal lateral aspect there is a ulcerated area this is 1 cm in diameter there is scant serous drainage today.  No surrounding erythema warmth or odor Imaging: No results found.   Labs: Lab Results  Component Value Date   HGBA1C 4.9 09/14/2021   HGBA1C 5.3 08/15/2020   HGBA1C 5.5 11/02/2019   ESRSEDRATE 32 (H) 12/03/2021   ESRSEDRATE 126 (H) 09/19/2021   ESRSEDRATE 130 (H) 09/18/2021   CRP 6.4 (H) 09/19/2021   CRP 12.0 (H) 09/18/2021   CRP 20.4 (H) 09/17/2021   REPTSTATUS 09/22/2021 FINAL 09/17/2021   GRAMSTAIN  09/17/2021    FEW WBC PRESENT, PREDOMINANTLY PMN NO  ORGANISMS SEEN    CULT  09/17/2021    No growth aerobically or anaerobically. Performed at [facility], [place] [address], [place], [place] [address]    LABORGA PROTEUS MIRABILIS (A) 03/19/2021     Lab Results  Component Value Date   ALBUMIN 2.3 (L) 09/22/2021   ALBUMIN 2.1 (L) 09/19/2021   ALBUMIN 2.0 (L) 09/18/2021    Lab Results  Component Value Date   MG 1.7 09/14/2021   MG 2.2 04/23/2021   MG 2.4 04/22/2021   No results found for: "VD25OH"  No results found for: "PREALBUMIN"    Latest Ref Rng & Units 12/03/2021   11:59 AM 09/22/2021    3:38 AM 09/19/2021    3:13 AM  CBC EXTENDED  WBC 4.0 - 10.5 K/uL 3.8  2.9  2.8   RBC 4.22 - 5.81 MIL/uL 3.41  3.06  2.99   Hemoglobin 13.0 - 17.0 g/dL 10.6  9.5  9.4   HCT 39.0 - 52.0 % 35.4  31.8  31.3   Platelets 150 - 400 K/uL 190  260  229   NEUT# 1.7 - 7.7 K/uL 2.3     Lymph# 0.7 - 4.0 K/uL 1.0        There is no height or weight on file to calculate  BMI.  Orders:  No orders of the defined types were placed in this encounter.  No orders of the defined types were placed in this encounter.    Procedures: No procedures performed  Clinical Data: No additional findings.  ROS:  All other systems negative, except as noted in the HPI. Review of Systems  Objective: Vital Signs: There were no vitals taken for this visit.  Specialty Comments:  No specialty comments available.  PMFS History: Patient Active Problem List   Diagnosis Date Noted   Unilateral AKA, left ([place]) 09/15/2021   Hypokalemia 09/14/2021   Left leg cellulitis [id number]   Chronic systolic CHF (congestive heart failure) ([place]) 09/14/2021   Fall at home, initial encounter 09/14/2021   Acute on chronic combined systolic and diastolic CHF (congestive heart failure) ([place]) 04/17/2021   Physical deconditioning 04/17/2021   Cellulitis of groin, right 04/14/2021   Pressure injury of skin 04/14/2021   Acute respiratory failure with hypoxia ([place])  01/19/2021   Hypothyroidism    Macrocytic anemia    Morbid obesity with BMI of 60.0-69.9, adult ([place])    Pneumonia due to COVID-19 virus 01/15/2021   Cellulitis of left leg    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[place])    Infection of prosthetic left knee joint ([place]) 11/08/2019   Septic joint of left knee joint ([place]) 11/06/2019   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[place]) 08/15/2019   Open knee wound 08/14/2019   S/P left TK revision 04/03/2019   Administration of long-term prophylactic antibiotics    History of streptococcal infection    History of DVT (deep vein thrombosis)    S/P rev left TK 12/15/2015   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   Preoperative cardiovascular examination 04/17/2013   OSA (obstructive sleep apnea)-3 L of oxygen at night and not CPAP 11/27/2012   Chronic anticoagulation, with Xarelto 09/10/2012   DVT (deep venous thrombosis), possible 09/10/2012   SOB (shortness of breath) 08/26/2012   Chest discomfort 08/26/2012   Persistent atrial fibrillation ([place]) 08/26/2012   Super obesity 06/27/2012   Expected blood loss anemia 06/27/2012   S/P left TK revision 06/23/2012   Septic arthritis of knee ([place])    Cellulitis 09/19/2010   METHICILLIN RESISTANT STAPHYLOCOCCUS AUREUS INFECTION 09/10/2009   STREPTOCOCCUS INFECTION CCE & UNS SITE GROUP C 07/04/2008   CHRONIC KIDNEY DISEASE UNSPECIFIED 07/04/2008   INFECTION DUE TO INTERNAL ORTH DEVICE NEC 03/02/2006   Past Medical History:  Diagnosis Date   CHF (congestive heart failure) ([place]) 05/2019   Chronic anticoagulation    with Xarelto   Complication of anesthesia    PT STATES HARD TO WAKE UP AFTER ONE SUGERY -[place].  NO PROBLEMS WITH ANY OTHER SURGERY   Complication of anesthesia    in 12/2018-states started  shaking after surgery, could not get warm    Dysrhythmia    A-fib   GERD (gastroesophageal reflux disease)    History of blood clots    History of blood  transfusion    Hypothyroidism    Pain    BACK PAIN - PT ATTRIBUTES TO THE WAY HE WALKS DUE TO LEFT KNEE PROBLEM   Persistent atrial fibrillation (HCC)    Septic arthritis of knee (HCC)    LEFT KNEE   Shortness of breath    WITH EXERTION AND PAIN   Sleep apnea    uses CPAP,    Family History  Problem Relation Age of Onset   Heart disease Father  49   Pancreatic cancer Sister    Liver cancer Sister    Cervical cancer Sister    Breast cancer Sister    Diabetes Sister    Colon cancer Neg Hx    Throat cancer Neg Hx    Stomach cancer Neg Hx    Kidney disease Neg Hx    Liver disease Neg Hx     Past Surgical History:  Procedure Laterality Date   2010 REMOVAL OF LEFT TOTAL KNEE     AMPUTATION Left 11/17/2019   Procedure: AMPUTATION ABOVE KNEE;  Surgeon: Newt Minion, MD;  Location: [facility];  Service: Orthopedics;  Laterality: Left;   CARDIAC CATHETERIZATION  2006   CARDIOVERSION N/A 08/28/2012   Procedure: CARDIOVERSION;  Surgeon: Sanda Klein, MD;  Location: [facility];  Service: Cardiovascular;  Laterality: N/A;   CARDIOVERSION N/A 09/01/2012   Procedure: CARDIOVERSION;  Surgeon: Pixie Casino, MD;  Location: [facility];  Service: Cardiovascular;  Laterality: N/A;   COLONOSCOPY N/A 06/13/2013   Procedure: COLONOSCOPY;  Surgeon: Inda Castle, MD;  [facility];  Service: Endoscopy;  Laterality: N/A;   EXCISIONAL TOTAL KNEE ARTHROPLASTY WITH ANTIBIOTIC SPACERS Left 09/21/2018   Procedure: Resection of tibia versus both components with placement of antibiotic spacer;  Surgeon: Paralee Cancel, MD;  [facility];  Service: Orthopedics;  Laterality: Left;  2.5 hrs   EXCISIONAL TOTAL KNEE ARTHROPLASTY WITH ANTIBIOTIC SPACERS Left 01/02/2019   Procedure: Repeat Washout and placement of antibiotic spacer left knee;  Surgeon: Paralee Cancel, MD;  [facility];  Service: Orthopedics;  Laterality: Left;  2 hrs   HYDROCELECTOY   2012   I & D EXTREMITY Left 08/14/2019   Procedure:  IRRIGATION AND DEBRIDEMENT LEFT KNEE WOUND;  Surgeon: Paralee Cancel, MD;  [facility];  Service: Orthopedics;  Laterality: Left;  60 mins   I & D KNEE WITH POLY EXCHANGE Left 05/17/2019   Procedure: IRRIGATION AND DEBRIDEMENT KNEE WITH POLY EXCHANGE;  Surgeon: Paralee Cancel, MD;  [facility];  Service: Orthopedics;  Laterality: Left;  90 mins   IR US GUIDE BX ASP/DRAIN  09/17/2021   IRRIGATION AND DEBRIDEMENT KNEE Left 04/03/2019   Procedure: REIMPLANTATION OF LEFT KNEE TIBIAL COMPONENT.;  Surgeon: Paralee Cancel, MD;  [facility];  Service: Orthopedics;  Laterality: Left;  need 120 min   IRRIGATION AND DEBRIDEMENT KNEE Left 11/06/2019   Procedure: IRRIGATION AND DEBRIDEMENT KNEE;  Surgeon: Paralee Cancel, MD;  [facility];  Service: Orthopedics;  Laterality: Left;  90 mins   LEFT KNEE ARTHROSCOPY  1999   LEFT KNEE SURGERY UPPER TIBIAL OSTOMY     LEFT TOTAL KNEE REMOVAL FOR INFECTION  2006   REIMPLANTATION LEFT TOTAL KNEE  2008   REIMPLANTATION LEFT TOTAL KNEE   2006   REIMPLANTATION OF TOTAL KNEE Left 06/23/2012   Procedure: REIMPLANTATION OF LEFT TOTAL KNEE;  Surgeon: Mauri Pole, MD;  [facility];  Service: Orthopedics;  Laterality: Left;   REMOVAL LEFT TOTAL KNEE   2008   REPLACEMENT LEFT KNEE  2002   REPLACEMENT RIGHT KNEE  2003   REVISION LEFT KNEE CAP   2004   RIGHT KNEE ARTHROSCOPY  1998   TEE WITHOUT CARDIOVERSION N/A 08/28/2012   Procedure: TRANSESOPHAGEAL ECHOCARDIOGRAM (TEE);  Surgeon: Sanda Klein, MD;  Location: [facility];  Service: Cardiovascular;  Laterality: N/A;   TONSILLECTOMY     TOTAL KNEE REVISION Left 12/15/2015   Procedure: TOTAL KNEE REVISION REPLACEMENT;  Surgeon: Rodman Key  Alvan Dame, MD;  [facility];  Service: Orthopedics;  Laterality: Left;  Adductor Block   Social History   Occupational History   Not on file  Tobacco Use   Smoking status: Never   Smokeless tobacco: Never  Vaping Use   Vaping Use: Never used  Substance and Sexual Activity    Alcohol use: Not Currently    Comment: not since 1999. However, had around 6 drinks a day between 1993-1999, roughly.   Drug use: No   Sexual activity: Yes    Partners: Female

## 2022-03-24 ENCOUNTER — Telehealth: Payer: Self-pay | Admitting: Family

## 2022-03-24 MED ORDER — HYDROCODONE-ACETAMINOPHEN 5-325 MG PO TABS: 1.0000 | ORAL_TABLET | Freq: Four times a day (QID) | ORAL | 0 refills | Status: DC | PRN

## 2022-03-24 NOTE — Telephone Encounter (Signed)
You saw pt in office yesterday morning. I don't see any mention of a pain med? Bactrim was sent in yesterday.

## 2022-03-24 NOTE — Addendum Note (Signed)
Addended by: Suzan Slick on: 03/24/2022 09:46 AM   Modules accepted: Orders

## 2022-03-24 NOTE — Telephone Encounter (Signed)
Patient checking on his pain medication

## 2022-03-24 NOTE — Addendum Note (Signed)
Addended by: Suzan Slick on: 03/24/2022 09:47 AM   Modules accepted: Orders

## 2022-03-25 ENCOUNTER — Telehealth: Payer: Self-pay | Admitting: Orthopedic Surgery

## 2022-03-25 NOTE — Telephone Encounter (Signed)
Patient states he had 10 days work of Antibiotics and would like to know does he need to hold off on surgery until he finishes his medication he would like a call to explain what is going on, please advise

## 2022-03-26 NOTE — Telephone Encounter (Signed)
LMTCB

## 2022-03-30 NOTE — Telephone Encounter (Signed)
SW pt, he had questions about when he arrives to his surgery at the hospital. He lost the paper he had everything written on for surgery. I advised to be there at main entrance of hospital at 11:30 am. I let him know that they will give him another call the night prior to his surgery date and give him all this information again.

## 2022-04-01 ENCOUNTER — Other Ambulatory Visit: Payer: Self-pay

## 2022-04-01 ENCOUNTER — Encounter (HOSPITAL_COMMUNITY): Payer: Self-pay | Admitting: Orthopedic Surgery

## 2022-04-01 NOTE — Progress Notes (Signed)
Spoke with pt for pre-op call. Pt has hx of A-fib and is on Eliquis. When asked if he was instructed to hold it, he stated that no one told him to hold it but because he has had surgeries in the past and was told to hold it for those, he states his last dose was 03/31/22 PM dose.   Pt states he is not diabetic.   Pt is unable to get into the shower so he will bathe at the sink. Instructions given.   Pt will be arriving via [employer]. He states he has no one to drive him home or stay with him after surgery.

## 2022-04-02 ENCOUNTER — Encounter (HOSPITAL_COMMUNITY): Payer: Self-pay | Admitting: Orthopedic Surgery

## 2022-04-02 ENCOUNTER — Other Ambulatory Visit: Payer: Self-pay

## 2022-04-02 ENCOUNTER — Observation Stay (HOSPITAL_COMMUNITY)
Admission: RE | Admit: 2022-04-02 | Discharge: 2022-04-03 | Disposition: A | Discharge status: Home / Self Care | Payer: Medicare Other | Attending: Orthopedic Surgery | Admitting: Orthopedic Surgery

## 2022-04-02 ENCOUNTER — Encounter (HOSPITAL_COMMUNITY)
Admission: RE | Disposition: A | Discharge status: Home / Self Care | Payer: Self-pay | Source: Home / Self Care | Attending: Orthopedic Surgery

## 2022-04-02 ENCOUNTER — Ambulatory Visit (HOSPITAL_BASED_OUTPATIENT_CLINIC_OR_DEPARTMENT_OTHER): Payer: Medicare Other | Admitting: Anesthesiology

## 2022-04-02 ENCOUNTER — Ambulatory Visit (HOSPITAL_COMMUNITY): Payer: Medicare Other | Admitting: Anesthesiology

## 2022-04-02 DIAGNOSIS — I2699 Other pulmonary embolism without acute cor pulmonale: Secondary | ICD-10-CM | POA: Diagnosis not present

## 2022-04-02 DIAGNOSIS — T847XXA Infection and inflammatory reaction due to other internal orthopedic prosthetic devices, implants and grafts, initial encounter: Secondary | ICD-10-CM

## 2022-04-02 DIAGNOSIS — T8781 Dehiscence of amputation stump: Secondary | ICD-10-CM

## 2022-04-02 DIAGNOSIS — Z9989 Dependence on other enabling machines and devices: Secondary | ICD-10-CM | POA: Diagnosis not present

## 2022-04-02 DIAGNOSIS — G4733 Obstructive sleep apnea (adult) (pediatric): Secondary | ICD-10-CM

## 2022-04-02 DIAGNOSIS — Z7722 Contact with and (suspected) exposure to environmental tobacco smoke (acute) (chronic): Secondary | ICD-10-CM | POA: Diagnosis not present

## 2022-04-02 DIAGNOSIS — E039 Hypothyroidism, unspecified: Secondary | ICD-10-CM | POA: Diagnosis not present

## 2022-04-02 DIAGNOSIS — Z79899 Other long term (current) drug therapy: Secondary | ICD-10-CM | POA: Diagnosis not present

## 2022-04-02 DIAGNOSIS — Z7901 Long term (current) use of anticoagulants: Secondary | ICD-10-CM | POA: Insufficient documentation

## 2022-04-02 DIAGNOSIS — Z96653 Presence of artificial knee joint, bilateral: Secondary | ICD-10-CM | POA: Diagnosis not present

## 2022-04-02 DIAGNOSIS — I4891 Unspecified atrial fibrillation: Secondary | ICD-10-CM | POA: Diagnosis not present

## 2022-04-02 DIAGNOSIS — I509 Heart failure, unspecified: Secondary | ICD-10-CM | POA: Diagnosis not present

## 2022-04-02 DIAGNOSIS — Z89612 Acquired absence of left leg above knee: Secondary | ICD-10-CM

## 2022-04-02 DIAGNOSIS — Y792 Prosthetic and other implants, materials and accessory orthopedic devices associated with adverse incidents: Secondary | ICD-10-CM | POA: Insufficient documentation

## 2022-04-02 DIAGNOSIS — T84621A Infection and inflammatory reaction due to internal fixation device of left femur, initial encounter: Principal | ICD-10-CM | POA: Insufficient documentation

## 2022-04-02 DIAGNOSIS — S78112A Complete traumatic amputation at level between left hip and knee, initial encounter: Secondary | ICD-10-CM

## 2022-04-02 HISTORY — PX: HARDWARE REMOVAL: SHX979

## 2022-04-02 LAB — BASIC METABOLIC PANEL
Anion gap: 12 (ref 5–15)
BUN: 24 mg/dL — ABNORMAL HIGH (ref 8–23)
CO2: 21 mmol/L — ABNORMAL LOW (ref 22–32)
Calcium: 9.4 mg/dL (ref 8.9–10.3)
Chloride: 105 mmol/L (ref 98–111)
Creatinine, Ser: 1.14 mg/dL (ref 0.61–1.24)
GFR, Estimated: >60 mL/min (ref >60)
Glucose, Bld: 89 mg/dL (ref 70–99)
Potassium: 4.4 mmol/L (ref 3.5–5.1)
Sodium: 138 mmol/L (ref 135–145)

## 2022-04-02 LAB — CBC
HCT: 39.3 % (ref 39.0–52.0)
Hemoglobin: 12.1 g/dL — ABNORMAL LOW (ref 13.0–17.0)
MCH: 30.8 pg (ref 26.0–34.0)
MCHC: 30.8 g/dL (ref 30.0–36.0)
MCV: 100 fL (ref 80.0–100.0)
Platelets: 179 10*3/uL (ref 150–400)
RBC: 3.93 MIL/uL — ABNORMAL LOW (ref 4.22–5.81)
RDW: 15.8 % — ABNORMAL HIGH (ref 11.5–15.5)
WBC: 4 10*3/uL (ref 4.0–10.5)
nRBC: 0 % (ref 0.0–0.2)

## 2022-04-02 LAB — SURGICAL PCR SCREEN
MRSA, PCR: NEGATIVE
Staphylococcus aureus: NEGATIVE

## 2022-04-02 SURGERY — REMOVAL, HARDWARE
Anesthesia: General | Site: Leg Upper | Laterality: Left

## 2022-04-02 MED ORDER — METOPROLOL SUCCINATE ER 25 MG PO TB24
37.5000 mg | ORAL_TABLET | Freq: Every day | ORAL | Status: DC
  Administered 2022-04-03: 37.5 mg via ORAL
  Filled 2022-04-02: qty 2

## 2022-04-02 MED ORDER — HYDROMORPHONE HCL 1 MG/ML IJ SOLN
0.2500 mg | INTRAMUSCULAR | Status: DC | PRN
  Administered 2022-04-02: 0.5 mg via INTRAVENOUS

## 2022-04-02 MED ORDER — FENTANYL CITRATE (PF) 100 MCG/2ML IJ SOLN
25.0000 ug | INTRAMUSCULAR | Status: DC | PRN
  Administered 2022-04-02 (×3): 50 ug via INTRAVENOUS

## 2022-04-02 MED ORDER — FENTANYL CITRATE (PF) 250 MCG/5ML IJ SOLN
INTRAMUSCULAR | Status: AC
  Filled 2022-04-02: qty 5

## 2022-04-02 MED ORDER — CHLORHEXIDINE GLUCONATE 0.12 % MT SOLN
15.0000 mL | Freq: Once | OROMUCOSAL | Status: AC
  Administered 2022-04-02: 15 mL via OROMUCOSAL
  Filled 2022-04-02: qty 15

## 2022-04-02 MED ORDER — PHENYLEPHRINE 80 MCG/ML (10ML) SYRINGE FOR IV PUSH (FOR BLOOD PRESSURE SUPPORT)
PREFILLED_SYRINGE | INTRAVENOUS | Status: AC
  Filled 2022-04-02: qty 20

## 2022-04-02 MED ORDER — VANCOMYCIN HCL 1000 MG IV SOLR
INTRAVENOUS | Status: AC
  Filled 2022-04-02: qty 20

## 2022-04-02 MED ORDER — DOCUSATE SODIUM 100 MG PO CAPS
100.0000 mg | ORAL_CAPSULE | Freq: Every day | ORAL | Status: DC
  Administered 2022-04-03: 100 mg via ORAL
  Filled 2022-04-02: qty 1

## 2022-04-02 MED ORDER — GUAIFENESIN-DM 100-10 MG/5ML PO SYRP: 15.0000 mL | ORAL_SOLUTION | ORAL | Status: DC | PRN

## 2022-04-02 MED ORDER — POTASSIUM CHLORIDE CRYS ER 20 MEQ PO TBCR: 20.0000 meq | EXTENDED_RELEASE_TABLET | Freq: Every day | ORAL | Status: DC | PRN

## 2022-04-02 MED ORDER — APIXABAN 5 MG PO TABS
5.0000 mg | ORAL_TABLET | Freq: Two times a day (BID) | ORAL | Status: DC
  Administered 2022-04-02 – 2022-04-03 (×2): 5 mg via ORAL
  Filled 2022-04-02 (×2): qty 1

## 2022-04-02 MED ORDER — ONDANSETRON HCL 4 MG/2ML IJ SOLN: 4.0000 mg | Freq: Four times a day (QID) | INTRAMUSCULAR | Status: DC | PRN

## 2022-04-02 MED ORDER — ORAL CARE MOUTH RINSE: 15.0000 mL | Freq: Once | OROMUCOSAL | Status: AC

## 2022-04-02 MED ORDER — LACTATED RINGERS IV SOLN: INTRAVENOUS | Status: DC

## 2022-04-02 MED ORDER — FENTANYL CITRATE (PF) 250 MCG/5ML IJ SOLN
INTRAMUSCULAR | Status: DC | PRN
  Administered 2022-04-02: 50 ug via INTRAVENOUS
  Administered 2022-04-02 (×3): 25 ug via INTRAVENOUS

## 2022-04-02 MED ORDER — PROPOFOL 10 MG/ML IV BOLUS
INTRAVENOUS | Status: AC
  Filled 2022-04-02: qty 20

## 2022-04-02 MED ORDER — MAGNESIUM CITRATE PO SOLN: 1.0000 | Freq: Once | ORAL | Status: DC | PRN

## 2022-04-02 MED ORDER — AMISULPRIDE (ANTIEMETIC) 5 MG/2ML IV SOLN: 10.0000 mg | Freq: Once | INTRAVENOUS | Status: DC | PRN

## 2022-04-02 MED ORDER — ONDANSETRON HCL 4 MG/2ML IJ SOLN
INTRAMUSCULAR | Status: DC | PRN
  Administered 2022-04-02: 4 mg via INTRAVENOUS

## 2022-04-02 MED ORDER — FENTANYL CITRATE (PF) 100 MCG/2ML IJ SOLN
INTRAMUSCULAR | Status: AC
  Filled 2022-04-02: qty 2

## 2022-04-02 MED ORDER — ZINC SULFATE 220 (50 ZN) MG PO CAPS
220.0000 mg | ORAL_CAPSULE | Freq: Every day | ORAL | Status: DC
  Administered 2022-04-02 – 2022-04-03 (×2): 220 mg via ORAL
  Filled 2022-04-02 (×2): qty 1

## 2022-04-02 MED ORDER — PHENOL 1.4 % MT LIQD: 1.0000 | OROMUCOSAL | Status: DC | PRN

## 2022-04-02 MED ORDER — VITAMIN C 500 MG PO TABS
1000.0000 mg | ORAL_TABLET | Freq: Every day | ORAL | Status: DC
  Administered 2022-04-02 – 2022-04-03 (×2): 1000 mg via ORAL
  Filled 2022-04-02 (×2): qty 2

## 2022-04-02 MED ORDER — BISACODYL 5 MG PO TBEC: 5.0000 mg | DELAYED_RELEASE_TABLET | Freq: Every day | ORAL | Status: DC | PRN

## 2022-04-02 MED ORDER — DEXAMETHASONE SODIUM PHOSPHATE 10 MG/ML IJ SOLN
INTRAMUSCULAR | Status: DC | PRN
  Administered 2022-04-02: 5 mg via INTRAVENOUS

## 2022-04-02 MED ORDER — PROMETHAZINE HCL 25 MG/ML IJ SOLN: 6.2500 mg | INTRAMUSCULAR | Status: DC | PRN

## 2022-04-02 MED ORDER — LABETALOL HCL 5 MG/ML IV SOLN: 10.0000 mg | INTRAVENOUS | Status: DC | PRN

## 2022-04-02 MED ORDER — HYDROMORPHONE HCL 1 MG/ML IJ SOLN
INTRAMUSCULAR | Status: AC
  Filled 2022-04-02: qty 1

## 2022-04-02 MED ORDER — LIDOCAINE 2% (20 MG/ML) 5 ML SYRINGE
INTRAMUSCULAR | Status: DC | PRN
  Administered 2022-04-02: 100 mg via INTRAVENOUS

## 2022-04-02 MED ORDER — SODIUM CHLORIDE 0.9 % IV SOLN: INTRAVENOUS | Status: DC

## 2022-04-02 MED ORDER — CEFAZOLIN SODIUM-DEXTROSE 2-4 GM/100ML-% IV SOLN
2.0000 g | Freq: Three times a day (TID) | INTRAVENOUS | Status: AC
  Administered 2022-04-02 – 2022-04-03 (×2): 2 g via INTRAVENOUS
  Filled 2022-04-02 (×2): qty 100

## 2022-04-02 MED ORDER — HYDRALAZINE HCL 20 MG/ML IJ SOLN: 5.0000 mg | INTRAMUSCULAR | Status: DC | PRN

## 2022-04-02 MED ORDER — ACETAMINOPHEN 325 MG PO TABS: 325.0000 mg | ORAL_TABLET | Freq: Four times a day (QID) | ORAL | Status: DC | PRN

## 2022-04-02 MED ORDER — JUVEN PO PACK
1.0000 | PACK | Freq: Two times a day (BID) | ORAL | Status: DC
  Administered 2022-04-03: 1 via ORAL
  Filled 2022-04-02 (×2): qty 1

## 2022-04-02 MED ORDER — ALUM & MAG HYDROXIDE-SIMETH 200-200-20 MG/5ML PO SUSP: 15.0000 mL | ORAL | Status: DC | PRN

## 2022-04-02 MED ORDER — PROPOFOL 10 MG/ML IV BOLUS
INTRAVENOUS | Status: DC | PRN
  Administered 2022-04-02: 200 mg via INTRAVENOUS

## 2022-04-02 MED ORDER — CEFAZOLIN IN SODIUM CHLORIDE 3-0.9 GM/100ML-% IV SOLN
3.0000 g | INTRAVENOUS | Status: AC
  Administered 2022-04-02: 3 g via INTRAVENOUS

## 2022-04-02 MED ORDER — DEXAMETHASONE SODIUM PHOSPHATE 10 MG/ML IJ SOLN
INTRAMUSCULAR | Status: AC
  Filled 2022-04-02: qty 1

## 2022-04-02 MED ORDER — METOPROLOL TARTRATE 5 MG/5ML IV SOLN: 2.0000 mg | INTRAVENOUS | Status: DC | PRN

## 2022-04-02 MED ORDER — ONDANSETRON HCL 4 MG/2ML IJ SOLN
INTRAMUSCULAR | Status: AC
  Filled 2022-04-02: qty 2

## 2022-04-02 MED ORDER — OXYCODONE HCL 5 MG PO TABS
5.0000 mg | ORAL_TABLET | ORAL | Status: DC | PRN
  Administered 2022-04-03: 10 mg via ORAL
  Filled 2022-04-02: qty 2

## 2022-04-02 MED ORDER — PHENYLEPHRINE 80 MCG/ML (10ML) SYRINGE FOR IV PUSH (FOR BLOOD PRESSURE SUPPORT)
PREFILLED_SYRINGE | INTRAVENOUS | Status: DC | PRN
  Administered 2022-04-02 (×5): 160 ug via INTRAVENOUS

## 2022-04-02 MED ORDER — OXYCODONE HCL 5 MG PO TABS
10.0000 mg | ORAL_TABLET | ORAL | Status: DC | PRN
  Administered 2022-04-02 – 2022-04-03 (×3): 15 mg via ORAL
  Filled 2022-04-02 (×3): qty 3

## 2022-04-02 MED ORDER — LEVOTHYROXINE SODIUM 25 MCG PO TABS
125.0000 ug | ORAL_TABLET | Freq: Every day | ORAL | Status: DC
  Administered 2022-04-03: 125 ug via ORAL
  Filled 2022-04-02: qty 1

## 2022-04-02 MED ORDER — HYDROMORPHONE HCL 1 MG/ML IJ SOLN: 0.5000 mg | INTRAMUSCULAR | Status: DC | PRN

## 2022-04-02 MED ORDER — DIPHENHYDRAMINE HCL 12.5 MG/5ML PO ELIX: 12.5000 mg | ORAL_SOLUTION | ORAL | Status: DC | PRN

## 2022-04-02 MED ORDER — MAGNESIUM SULFATE 2 GM/50ML IV SOLN: 2.0000 g | Freq: Every day | INTRAVENOUS | Status: DC | PRN

## 2022-04-02 MED ORDER — POLYETHYLENE GLYCOL 3350 17 G PO PACK: 17.0000 g | PACK | Freq: Every day | ORAL | Status: DC | PRN

## 2022-04-02 MED ORDER — CEFAZOLIN SODIUM-DEXTROSE 2-4 GM/100ML-% IV SOLN
INTRAVENOUS | Status: AC
  Filled 2022-04-02: qty 100

## 2022-04-02 MED ORDER — VANCOMYCIN HCL 500 MG IV SOLR
INTRAVENOUS | Status: DC | PRN
  Administered 2022-04-02: 1000 mg

## 2022-04-02 MED ORDER — ACETAMINOPHEN 500 MG PO TABS
1000.0000 mg | ORAL_TABLET | Freq: Once | ORAL | Status: AC
  Administered 2022-04-02: 1000 mg via ORAL
  Filled 2022-04-02: qty 2

## 2022-04-02 MED ORDER — PANTOPRAZOLE SODIUM 40 MG PO TBEC
40.0000 mg | DELAYED_RELEASE_TABLET | Freq: Every day | ORAL | Status: DC
  Administered 2022-04-02 – 2022-04-03 (×2): 40 mg via ORAL
  Filled 2022-04-02 (×2): qty 1

## 2022-04-02 SURGICAL SUPPLY — 53 items
BAG COUNTER SPONGE SURGICOUNT (BAG) ×1 IMPLANT
BAG SPNG CNTER NS LX DISP (BAG) ×1
BANDAGE ESMARK 6X9 LF (GAUZE/BANDAGES/DRESSINGS) IMPLANT
BLADE SURG 21 STRL SS (BLADE) IMPLANT
BNDG CMPR 9X6 STRL LF SNTH (GAUZE/BANDAGES/DRESSINGS)
BNDG COHESIVE 4X5 TAN STRL (GAUZE/BANDAGES/DRESSINGS) IMPLANT
BNDG ESMARK 6X9 LF (GAUZE/BANDAGES/DRESSINGS)
BNDG GAUZE DERMACEA FLUFF 4 (GAUZE/BANDAGES/DRESSINGS) ×1 IMPLANT
BNDG GZE DERMACEA 4 6PLY (GAUZE/BANDAGES/DRESSINGS) ×1
BUR EGG ELITE 5.0 (BURR) IMPLANT
CNTNR URN SCR LID CUP LEK RST (MISCELLANEOUS) IMPLANT
CONT SPEC 4OZ STRL OR WHT (MISCELLANEOUS) ×1
COVER SURGICAL LIGHT HANDLE (MISCELLANEOUS) ×2 IMPLANT
CUFF TOURN SGL QUICK 34 (TOURNIQUET CUFF)
CUFF TOURN SGL QUICK 42 (TOURNIQUET CUFF) IMPLANT
CUFF TRNQT CYL 34X4.125X (TOURNIQUET CUFF) IMPLANT
DRAPE C-ARM 42X72 X-RAY (DRAPES) IMPLANT
DRAPE DERMATAC (DRAPES) IMPLANT
DRAPE INCISE IOBAN 66X45 STRL (DRAPES) IMPLANT
DRAPE ORTHO SPLIT 77X108 STRL (DRAPES)
DRAPE SURG ORHT 6 SPLT 77X108 (DRAPES) IMPLANT
DRAPE U-SHAPE 47X51 STRL (DRAPES) ×1 IMPLANT
DRESSING PREVENA PLUS CUSTOM (GAUZE/BANDAGES/DRESSINGS) IMPLANT
DRSG EMULSION OIL 3X3 NADH (GAUZE/BANDAGES/DRESSINGS) ×1 IMPLANT
DRSG PREVENA PLUS CUSTOM (GAUZE/BANDAGES/DRESSINGS) ×1
DURAPREP 26ML APPLICATOR (WOUND CARE) ×1 IMPLANT
ELECT REM PT RETURN 9FT ADLT (ELECTROSURGICAL) ×1
ELECTRODE REM PT RTRN 9FT ADLT (ELECTROSURGICAL) ×1 IMPLANT
GAUZE PAD ABD 8X10 STRL (GAUZE/BANDAGES/DRESSINGS) ×1 IMPLANT
GAUZE SPONGE 4X4 12PLY STRL (GAUZE/BANDAGES/DRESSINGS) ×1 IMPLANT
GLOVE BIOGEL PI IND STRL 9 (GLOVE) ×1 IMPLANT
GLOVE SURG ORTHO 9.0 STRL STRW (GLOVE) ×1 IMPLANT
GOWN STRL REUS W/ TWL XL LVL3 (GOWN DISPOSABLE) ×3 IMPLANT
GOWN STRL REUS W/TWL XL LVL3 (GOWN DISPOSABLE) ×3
GRAFT SKIN MARIGEN MICRO 38 (Tissue) IMPLANT
KIT BASIN OR (CUSTOM PROCEDURE TRAY) ×1 IMPLANT
KIT TURNOVER KIT B (KITS) ×1 IMPLANT
MANIFOLD NEPTUNE II (INSTRUMENTS) ×1 IMPLANT
NS IRRIG 1000ML POUR BTL (IV SOLUTION) ×1 IMPLANT
PACK ORTHO EXTREMITY (CUSTOM PROCEDURE TRAY) ×1 IMPLANT
PAD ARMBOARD 7.5X6 YLW CONV (MISCELLANEOUS) ×2 IMPLANT
SPONGE T-LAP 18X18 ~~LOC~~+RFID (SPONGE) IMPLANT
STAPLER VISISTAT 35W (STAPLE) IMPLANT
STOCKINETTE IMPERVIOUS 9X36 MD (GAUZE/BANDAGES/DRESSINGS) IMPLANT
SUT ETHILON 2 0 PSLX (SUTURE) IMPLANT
SUT VIC AB 0 CT1 27 (SUTURE)
SUT VIC AB 0 CT1 27XBRD ANBCTR (SUTURE) IMPLANT
SUT VIC AB 2-0 CT1 27 (SUTURE)
SUT VIC AB 2-0 CT1 TAPERPNT 27 (SUTURE) IMPLANT
TOWEL GREEN STERILE (TOWEL DISPOSABLE) ×1 IMPLANT
TOWEL GREEN STERILE FF (TOWEL DISPOSABLE) ×1 IMPLANT
UNDERPAD 30X36 HEAVY ABSORB (UNDERPADS AND DIAPERS) ×1 IMPLANT
WATER STERILE IRR 1000ML POUR (IV SOLUTION) ×1 IMPLANT

## 2022-04-02 NOTE — Anesthesia Procedure Notes (Signed)
Procedure Name: LMA Insertion Date/Time: 04/02/2022 1:03 PM  Performed by: Rande Brunt, CRNAPre-anesthesia Checklist: Patient identified, Emergency Drugs available, Suction available and Patient being monitored Patient Re-evaluated:Patient Re-evaluated prior to induction Oxygen Delivery Method: Circle System Utilized Preoxygenation: Pre-oxygenation with 100% oxygen Induction Type: IV induction Ventilation: Mask ventilation without difficulty LMA: LMA inserted LMA Size: 5.0 Number of attempts: 1 Airway Equipment and Method: Bite block Placement Confirmation: positive ETCO2 and CO2 detector Tube secured with: Tape Dental Injury: Teeth and Oropharynx as per pre-operative assessment

## 2022-04-02 NOTE — Interval H&P Note (Signed)
History and Physical Interval Note:  04/02/2022 12:54 PM  [name]  has presented today for surgery, with the diagnosis of Infected Hardware Left Femur.  The various methods of treatment have been discussed with the patient and family. After consideration of risks, benefits and other options for treatment, the patient has consented to  Procedure(s): REMOVAL LEFT FEMORAL HARDWARE (Left) as a surgical intervention.  The patient's history has been reviewed, patient examined, no change in status, stable for surgery.  I have reviewed the patient's chart and labs.  Questions were answered to the patient's satisfaction.     Newt Minion

## 2022-04-02 NOTE — Op Note (Signed)
04/02/2022  1:49 PM  PATIENT:  [name]    PRE-OPERATIVE DIAGNOSIS:  Infected Hardware Left Femur  POST-OPERATIVE DIAGNOSIS:  Same  PROCEDURE:  LEFT REMOVAL OF FEMORAL HARDWARE Revision left above-the-knee amputation. Application of Kerecis micro 38 cm. Application of vancomycin powder 1 g. Tissue and bone sent for cultures.  SURGEON:  Newt Minion, MD  PHYSICIAN ASSISTANT:None ANESTHESIA:   General  PREOPERATIVE INDICATIONS:  [name] is a  72 y.o. male with a diagnosis of Infected Hardware Left Femur who failed conservative measures and elected for surgical management.    The risks benefits and alternatives were discussed with the patient preoperatively including but not limited to the risks of infection, bleeding, nerve injury, cardiopulmonary complications, the need for revision surgery, among others, and the patient was willing to proceed.  OPERATIVE IMPLANTS: Kerecis micro graft 38 cm and vancomycin powder 1 g.  '@ENCIMAGES'$ @  OPERATIVE FINDINGS: There was fluid around the hardware and necrotic tissue consistent with deep abscess.  This tissue was sent for cultures.  OPERATIVE PROCEDURE: Patient was brought the operating room and underwent a general anesthetic.  After adequate levels anesthesia were obtained patient's left lower extremity was prepped using DuraPrep draped into a sterile field a timeout was called.  A fishmouth incision was made around the ulcerative tissue over the left above-knee amputation.  This was carried down to bone.  There is necrotic abscess tissue adjacent to the bone and retained hardware.  This tissue was sent for cultures the distal centimeter of bone was resected and this was also sent for cultures.  A rem B was used to make a slot anterior in the femur.  The retained hardware was removed.  A curette was used to further debride fibrinous tissue around the hardware and this was sent with the cultures.  Further bone was resected.   There was ischemic bone changes.  The wound was irrigated with normal saline.  The femoral canal was filled with 1 g vancomycin powder.  The soft tissue was covered with 38 cm of Kerecis micro graft.  The wound was closed using 2-0 nylon a customizable wound VAC was applied this had a good suction fit patient was extubated taken the PACU in stable condition.   DISCHARGE PLANNING:  Antibiotic duration: 24 hours antibiotics  Weightbearing: Nonweightbearing on the left  Pain medication: Opioid pathway  Dressing care/ Wound VAC: Wound VAC for 1 week  Ambulatory devices: Transfers with walker  Discharge to: Anticipate discharge to home.  Follow-up: In the office 1 week post operative.

## 2022-04-02 NOTE — Transfer of Care (Signed)
Immediate Anesthesia Transfer of Care Note  Patient: [name]  Procedure(s) Performed: LEFT REMOVAL OF FEMORAL HARDWARE (Left: Leg Upper)  Patient Location: PACU  Anesthesia Type:General  Level of Consciousness: awake, alert , and oriented  Airway & Oxygen Therapy: Patient Spontanous Breathing and Patient connected to face mask oxygen  Post-op Assessment: Report given to RN, Post -op Vital signs reviewed and stable, and Patient moving all extremities X 4  Post vital signs: Reviewed and stable  Last Vitals:  Vitals Value Taken Time  BP 108/74 04/02/22 1402  Temp    Pulse 89 04/02/22 1403  Resp    SpO2 93 % 04/02/22 1403  Vitals shown include unvalidated device data.  Last Pain:  Vitals:   04/02/22 1105  TempSrc: Oral  PainSc: 0-No pain         Complications: No notable events documented.
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
## 2022-04-02 NOTE — Anesthesia Preprocedure Evaluation (Addendum)
Anesthesia Evaluation  Patient identified by MRN, date of birth, ID band Patient awake    Reviewed: Allergy & Precautions, NPO status , Patient's Chart, lab work & pertinent test results  History of Anesthesia Complications (+) PROLONGED EMERGENCE and history of anesthetic complications  Airway Mallampati: III  TM Distance: >3 FB Neck ROM: Full    Dental no notable dental hx. (+) Dental Advisory Given   Pulmonary sleep apnea and Continuous Positive Airway Pressure Ventilation , PE   Pulmonary exam normal        Cardiovascular hypertension, Pt. on medications and Pt. on home beta blockers +CHF  Normal cardiovascular exam+ dysrhythmias Atrial Fibrillation   TTE 2023  IMPRESSIONS     1. Left ventricular ejection fraction, by estimation, is 45 to 50%. The  left ventricle has mildly decreased function. The left ventricle  demonstrates global hypokinesis. Left ventricular diastolic function could  not be evaluated.   2. Right ventricular systolic function was not well visualized. The right  ventricular size is moderately enlarged. There is moderately elevated  pulmonary artery systolic pressure.   3. Left atrial size was moderately dilated.   4. Right atrial size was severely dilated.   5. The mitral valve is grossly normal. Trivial mitral valve  regurgitation. No evidence of mitral stenosis.   6. The aortic valve was not well visualized. Aortic valve regurgitation  is not visualized. No aortic stenosis is present.   7. Pulmonic valve regurgitation not visualized.   8. The inferior vena cava is dilated in size with <50% respiratory  variability, suggesting right atrial pressure of 15 mmHg.   Comparison(s): Prior images reviewed side by side. Visually studies appear  similar when viewed side by side.   '14 TTE - EF 50% to 55%. LA was severely dilated. RV was mildly dilated. RA was moderately dilated.     Neuro/Psych   Headaches  negative psych ROS   GI/Hepatic Neg liver ROS,GERD  Controlled,,  Endo/Other  diabetes, Type 2Hypothyroidism  Morbid obesity  Renal/GU CRFRenal disease     Musculoskeletal  (+) Arthritis ,   Chronic back pain    Abdominal  (+) + obese  Peds  Hematology  (+) Blood dyscrasia, anemia   Anesthesia Other Findings Covid negative 11/02/19  Reproductive/Obstetrics                             Anesthesia Physical Anesthesia Plan  ASA: 3  Anesthesia Plan: General   Post-op Pain Management: Tylenol PO (pre-op)* and Minimal or no pain anticipated   Induction: Intravenous  PONV Risk Score and Plan: 3 and Treatment may vary due to age or medical condition, Ondansetron and Dexamethasone  Airway Management Planned: LMA  Additional Equipment: None  Intra-op Plan:   Post-operative Plan: Extubation in OR  Informed Consent: I have reviewed the patients History and Physical, chart, labs and discussed the procedure including the risks, benefits and alternatives for the proposed anesthesia with the patient or authorized representative who has indicated his/her understanding and acceptance.     Dental advisory given  Plan Discussed with: Anesthesiologist and CRNA  Anesthesia Plan Comments: ( )        Anesthesia Quick Evaluation

## 2022-04-03 DIAGNOSIS — T84621A Infection and inflammatory reaction due to internal fixation device of left femur, initial encounter: Secondary | ICD-10-CM | POA: Diagnosis not present

## 2022-04-03 MED ORDER — HYDROCODONE-ACETAMINOPHEN 5-325 MG PO TABS: 1.0000 | ORAL_TABLET | ORAL | 0 refills | Status: DC | PRN

## 2022-04-03 MED ORDER — SULFAMETHOXAZOLE-TRIMETHOPRIM 800-160 MG PO TABS: 1.0000 | ORAL_TABLET | Freq: Two times a day (BID) | ORAL | 0 refills | Status: DC

## 2022-04-03 NOTE — Discharge Summary (Signed)
Discharge Diagnoses:  Principal Problem:   S/P AKA (above knee amputation) unilateral, left (HCC) Active Problems:   Dehiscence of amputation stump of left lower extremity (HCC)   Surgeries: Procedure(s): LEFT REMOVAL OF FEMORAL HARDWARE on 04/02/2022    Consultants:   Discharged Condition: Improved  Hospital Course: [name] is an 72 y.o. male who was admitted 04/02/2022 with a chief complaint of infected hardware left above-knee amputation, with a final diagnosis of Infected Hardware Left Femur.  Patient was brought to the operating room on 04/02/2022 and underwent Procedure(s): LEFT REMOVAL OF FEMORAL HARDWARE.    Patient was given perioperative antibiotics:  Anti-infectives (From admission, onward)    Start     Dose/Rate Route Frequency Ordered Stop   04/03/22 0000  sulfamethoxazole-trimethoprim (BACTRIM DS) 800-160 MG tablet        1 tablet Oral 2 times daily 04/03/22 1014     04/02/22 1900  ceFAZolin (ANCEF) IVPB 2g/100 mL premix        2 g 200 mL/hr over 30 Minutes Intravenous Every 8 hours 04/02/22 1803 04/03/22 0237   04/02/22 1338  vancomycin (VANCOCIN) powder  Status:  Discontinued          As needed 04/02/22 1338 04/02/22 1357   04/02/22 1230  ceFAZolin (ANCEF) IVPB 3g/100 mL premix        3 g 200 mL/hr over 30 Minutes Intravenous On call to O.R. 04/02/22 1052 04/02/22 1305   04/02/22 1129  ceFAZolin (ANCEF) 2-4 GM/100ML-% IVPB       Note to Pharmacy: Alphonsus Sias: cabinet override      04/02/22 1129 04/02/22 2344     .  Patient was given sequential compression devices, early ambulation, and aspirin for DVT prophylaxis.  Recent vital signs: Patient Vitals for the past 24 hrs:  BP Temp Temp src Pulse Resp SpO2 Height Weight  04/03/22 0734 (!) 156/133 97.9 F (36.6 C) Oral 85 19 95 % -- --  04/03/22 0431 108/72 97.7 F (36.5 C) Oral 79 15 96 % -- --  04/03/22 0019 109/83 98 F (36.7 C) Oral 100 -- 95 % -- --  04/02/22 1947 120/78 98 F (36.7 C)  Oral 88 15 94 % -- --  04/02/22 1805 119/78 97.9 F (36.6 C) Oral 98 18 94 % -- --  04/02/22 1730 95/68 97.8 F (36.6 C) -- 78 20 96 % -- --  04/02/22 1700 99/69 -- -- 88 20 96 % -- --  04/02/22 1630 105/67 -- -- 85 18 91 % -- --  04/02/22 1600 92/64 97.8 F (36.6 C) -- 77 20 95 % -- --  04/02/22 1545 (!) 96/47 -- -- 77 17 94 % -- --  04/02/22 1530 94/73 -- -- 77 (!) 22 97 % -- --  04/02/22 1515 91/74 -- -- 77 19 95 % -- --  04/02/22 1500 101/69 -- -- 78 18 94 % -- --  04/02/22 1454 -- -- -- 78 19 95 % -- --  04/02/22 1445 107/76 -- -- 81 20 95 % -- --  04/02/22 1430 117/77 -- -- 79 17 93 % -- --  04/02/22 1426 -- -- -- 76 17 93 % -- --  04/02/22 1415 106/83 -- -- 82 19 92 % -- --  04/02/22 1404 108/74 97.8 F (36.6 C) -- 89 18 93 % -- --  04/02/22 1105 111/83 98.1 F (36.7 C) Oral 80 20 95 % '5\' 8"'$  (1.727 m) (!) 172.4 kg  .  Recent  laboratory studies: No results found.  Discharge Medications:   Allergies as of 04/03/2022       Reactions   Morphine Hives        Medication List     TAKE these medications    acetaminophen 500 MG tablet Commonly known as: TYLENOL Take 500 mg by mouth every 6 (six) hours as needed for mild pain or fever.   apixaban 5 MG Tabs tablet Commonly known as: Eliquis Take 1 tablet (5 mg total) by mouth 2 (two) times daily.   Desitin 13 % Crea Generic drug: Zinc Oxide Apply 1 Application topically 2 (two) times daily as needed.   furosemide 20 MG tablet Commonly known as: LASIX Take 1 tablet (20 mg total) by mouth daily.   HYDROcodone-acetaminophen 5-325 MG tablet Commonly known as: NORCO/VICODIN Take 1 tablet by mouth every 4 (four) hours as needed. What changed: when to take this   levothyroxine 125 MCG tablet Commonly known as: SYNTHROID Take 125 mcg by mouth daily.   methocarbamol 500 MG tablet Commonly known as: ROBAXIN Take 500 mg by mouth every 6 (six) hours as needed.   metoprolol succinate 25 MG 24 hr tablet Commonly  known as: TOPROL-XL Take 1.5 tablets (37.5 mg total) by mouth daily. Take with or immediately following a meal.   naphazoline-glycerin 0.012-0.2 % Soln Commonly known as: CLEAR EYES REDNESS Place 1-2 drops into both eyes 4 (four) times daily as needed for eye irritation.   potassium chloride 10 MEQ tablet Commonly known as: KLOR-CON M Take 1 tablet (10 mEq total) by mouth daily.   sulfamethoxazole-trimethoprim 800-160 MG tablet Commonly known as: BACTRIM DS Take 1 tablet by mouth 2 (two) times daily.        Diagnostic Studies: No results found.  Patient benefited maximally from their hospital stay and there were no complications.     Disposition: Discharge disposition: 01-Home or Self Care      Discharge Instructions     Call MD / Call 911   Complete by: As directed    If you experience chest pain or shortness of breath, CALL 911 and be transported to the hospital emergency room.  If you develope a fever above 101 F, pus (white drainage) or increased drainage or redness at the wound, or calf pain, call your surgeon's office.   Constipation Prevention   Complete by: As directed    Drink plenty of fluids.  Prune juice may be helpful.  You may use a stool softener, such as Colace (over the counter) 100 mg twice a day.  Use MiraLax (over the counter) for constipation as needed.   Diet - low sodium heart healthy   Complete by: As directed    Increase activity slowly as tolerated   Complete by: As directed    Negative Pressure Wound Therapy - Incisional   Complete by: As directed    Attach wound VAC dressing to a Praveena plus portable wound VAC pump for discharge.   Post-operative opioid taper instructions:   Complete by: As directed    POST-OPERATIVE OPIOID TAPER INSTRUCTIONS: It is important to wean off of your opioid medication as soon as possible. If you do not need pain medication after your surgery it is ok to stop day one. Opioids include: Codeine,  Hydrocodone(Norco, Vicodin), Oxycodone(Percocet, oxycontin) and hydromorphone amongst others.  Long term and even short term use of opiods can cause: Increased pain response Dependence Constipation Depression Respiratory depression And more.  Withdrawal symptoms can include Flu  like symptoms Nausea, vomiting And more Techniques to manage these symptoms Hydrate well Eat regular healthy meals Stay active Use relaxation techniques(deep breathing, meditating, yoga) Do Not substitute Alcohol to help with tapering If you have been on opioids for less than two weeks and do not have pain than it is ok to stop all together.  Plan to wean off of opioids This plan should start within one week post op of your joint replacement. Maintain the same interval or time between taking each dose and first decrease the dose.  Cut the total daily intake of opioids by one tablet each day Next start to increase the time between doses. The last dose that should be eliminated is the evening dose.          Follow-up Information     Newt Minion, MD Follow up in 1 week(s).   Specialty: Orthopedic Surgery Contact information: [address] [place] [address] [phone]                  Signed: Newt Minion 04/03/2022, 10:15 AM

## 2022-04-03 NOTE — Progress Notes (Signed)
Patient ID: [name], male   DOB: 1950/08/24, 72 y.o.   MRN: [id number] Patient is postoperative day 1 removal of intramedullary rod left femur cultures and revision above-knee amputation.  Cultures are negative to date.  Will plan for discharge with Bactrim DS and Vicodin.  Patient will need the Praveena plus portable wound VAC pump for discharge.

## 2022-04-03 NOTE — Evaluation (Signed)
Physical Therapy Evaluation Patient Details Name: [name] MRN: [id number] DOB: [birth year] Today's Date: 04/03/2022  History of Present Illness  72 y.o. male s/p LEFT REMOVAL OF FEMORAL HARDWARE on 04/02/2022, following dehiscence of Lt  AKA.Marland Kitchen PMHx A-fib, hypothyroidism, CHF, DVT, rt TKR.  Clinical Impression  Patient is s/p above surgery resulting in functional limitations due to the deficits listed below (see PT Problem List). PTA pt Mod I with sliding board transfers to and from w/c, had progressed to mobilizing outside with his w/c recently. He does report falling during transfers recently. Has a housekeeper that assists with laundry ever 2 weeks for a day. Patient required min assist, with great effort for transfers to and from w/c<>bed to set-up and stabilize sliding board and w/c with instructions for appropriate and safe transfer techniques. He does not have daily assistance for transfers at home.  Patient will benefit from skilled PT to increase their independence and safety with mobility to allow discharge to the venue listed below.          Recommendations for follow up therapy are one component of a multi-disciplinary discharge planning process, led by the attending physician.  Recommendations may be updated based on patient status, additional functional criteria and insurance authorization.  Follow Up Recommendations Skilled nursing-short term rehab (<3 hours/day) If he does not qualify would maximize home health services. Can patient physically be transported by private vehicle: No    Assistance Recommended at Discharge Intermittent Supervision/Assistance  Patient can return home with the following  A little help with walking and/or transfers;A little help with bathing/dressing/bathroom;Assistance with cooking/housework;Assist for transportation    Equipment Recommendations None recommended by PT  Recommendations for Other Services       Functional Status Assessment  Patient has had a recent decline in their functional status and demonstrates the ability to make significant improvements in function in a reasonable and predictable amount of time.     Precautions / Restrictions Precautions Precautions: Fall      Mobility  Bed Mobility Overal bed mobility: Needs Assistance Bed Mobility: Supine to Sit, Sit to Supine     Supine to sit: HOB elevated, Min assist Sit to supine: Min assist   General bed mobility comments: Min assist for patient to rise to EOB, providing Trunk support and patient pulling through therapist hand. Cues for technique throughout. Able to lie flat but needs min assist to block W/c, pt uses personal technique at home to scoot in bed. Needed min assist to reposition.    Transfers Overall transfer level: Needs assistance Equipment used: Sliding board Transfers: Bed to chair/wheelchair/BSC            Lateral/Scoot Transfers: Min assist, With slide board General transfer comment: Min assist to position sliding board and stabilize w/c. Cues for technique for increased safety with transfer. Very difficulty for pt to perform bed to w/c transfer with sliding board. Somewhat easier to transfer back to bed from w/c but again requiring min assist to stabilize w/c and instructions for safe sequencing.    Ambulation/Gait               General Gait Details: unable  Stairs            Wheelchair Mobility    Modified Rankin (Stroke Patients Only)       Balance Overall balance assessment: Mild deficits observed, not formally tested (Does not stand)  Pertinent Vitals/Pain Pain Assessment Pain Assessment: 0-10 Pain Score: 4  Pain Location: Lt residual limb Pain Descriptors / Indicators: Aching Pain Intervention(s): Monitored during session, Limited activity within patient's tolerance, Repositioned, Premedicated before session, Ice applied    Home Living  Family/patient expects to be discharged to:: Private residence Living Arrangements: Alone Available Help at Discharge: Friend(s);Available PRN/intermittently Type of Home: Apartment Home Access: Level entry       Home Layout: One level Home Equipment: Wheelchair - Publishing copy (2 wheels);Rollator (4 wheels);Standard Walker (Sliding board) Additional Comments: sliding boar    Prior Function Prior Level of Function : Needs assist             Mobility Comments: w/c transfer with sliding board; 1 fall recently. ADLs Comments: reports ind but a cleaning lady with do his laundry every couple of weeks.     Hand Dominance   Dominant Hand: Right    Extremity/Trunk Assessment   Upper Extremity Assessment Upper Extremity Assessment: Defer to OT evaluation    Lower Extremity Assessment Lower Extremity Assessment: LLE deficits/detail;Generalized weakness LLE Deficits / Details: Wound vac in place LLE: Unable to fully assess due to pain       Communication   Communication: No difficulties  Cognition Arousal/Alertness: Awake/alert Behavior During Therapy: WFL for tasks assessed/performed Overall Cognitive Status: Within Functional Limits for tasks assessed                                          General Comments General comments (skin integrity, edema, etc.): Lt residual limb elevated and ice applied    Exercises     Assessment/Plan    PT Assessment Patient needs continued PT services  PT Problem List Decreased strength;Decreased activity tolerance;Decreased balance;Decreased mobility;Decreased knowledge of use of DME;Decreased knowledge of precautions;Cardiopulmonary status limiting activity;Obesity;Pain       PT Treatment Interventions DME instruction;Gait training;Functional mobility training;Therapeutic activities;Therapeutic exercise;Balance training;Neuromuscular re-education;Patient/family education;Wheelchair mobility  training;Modalities    PT Goals (Current goals can be found in the Care Plan section)  Acute Rehab PT Goals Patient Stated Goal: Get help PT Goal Formulation: With patient Time For Goal Achievement: 04/09/22 Potential to Achieve Goals: Good    Frequency Min 3X/week     Co-evaluation               AM-PAC PT "6 Clicks" Mobility  Outcome Measure Help needed turning from your back to your side while in a flat bed without using bedrails?: A Little Help needed moving from lying on your back to sitting on the side of a flat bed without using bedrails?: A Little Help needed moving to and from a bed to a chair (including a wheelchair)?: A Little Help needed standing up from a chair using your arms (e.g., wheelchair or bedside chair)?: Total Help needed to walk in hospital room?: Total Help needed climbing 3-5 steps with a railing? : Total 6 Click Score: 12    End of Session Equipment Utilized During Treatment: Gait belt;Oxygen Activity Tolerance: Patient tolerated treatment well Patient left: in bed;with call bell/phone within reach;with bed alarm set Nurse Communication: Mobility status PT Visit Diagnosis: Repeated falls (R29.6);Muscle weakness (generalized) (M62.81);History of falling (Z91.81);Difficulty in walking, not elsewhere classified (R26.2);Pain Pain - Right/Left: Left Pain - part of body: Leg    Time: [id number] PT Time Calculation (min) (ACUTE ONLY): 41 min   Charges:   PT Evaluation $  PT Eval Low Complexity: 1 Low PT Treatments $Therapeutic Activity: 8-22 mins $Self Care/Home Management: McGehee, PT, DPT Physical Therapist Acute Rehabilitation Services [place]   Ellouise Newer 04/03/2022, 10:45 AM
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
## 2022-04-04 NOTE — Anesthesia Postprocedure Evaluation (Signed)
Anesthesia Post Note  Patient: [name]  Procedure(s) Performed: LEFT REMOVAL OF FEMORAL HARDWARE (Left: Leg Upper)     Anesthesia Type: General Anesthetic complications: no   No notable events documented.             Marillyn Goren DANIEL

## 2022-04-05 ENCOUNTER — Emergency Department (HOSPITAL_COMMUNITY)
Admission: EM | Admit: 2022-04-05 | Discharge: 2022-04-05 | Disposition: A | Discharge status: Home / Self Care | Payer: Medicare Other | Attending: Emergency Medicine | Admitting: Emergency Medicine

## 2022-04-05 ENCOUNTER — Other Ambulatory Visit: Payer: Self-pay

## 2022-04-05 ENCOUNTER — Telehealth: Payer: Self-pay | Admitting: Orthopedic Surgery

## 2022-04-05 ENCOUNTER — Encounter (HOSPITAL_COMMUNITY): Payer: Self-pay | Admitting: Orthopedic Surgery

## 2022-04-05 ENCOUNTER — Emergency Department (HOSPITAL_COMMUNITY): Payer: Medicare Other

## 2022-04-05 DIAGNOSIS — R58 Hemorrhage, not elsewhere classified: Secondary | ICD-10-CM

## 2022-04-05 DIAGNOSIS — Z7901 Long term (current) use of anticoagulants: Secondary | ICD-10-CM | POA: Insufficient documentation

## 2022-04-05 DIAGNOSIS — Y838 Other surgical procedures as the cause of abnormal reaction of the patient, or of later complication, without mention of misadventure at the time of the procedure: Secondary | ICD-10-CM | POA: Diagnosis not present

## 2022-04-05 DIAGNOSIS — T8189XA Other complications of procedures, not elsewhere classified, initial encounter: Secondary | ICD-10-CM | POA: Insufficient documentation

## 2022-04-05 DIAGNOSIS — Z89612 Acquired absence of left leg above knee: Secondary | ICD-10-CM | POA: Insufficient documentation

## 2022-04-05 LAB — CBC WITH DIFFERENTIAL/PLATELET
Abs Immature Granulocytes: 0.02 10*3/uL (ref 0.00–0.07)
Basophils Absolute: 0 10*3/uL (ref 0.0–0.1)
Basophils Relative: 1 %
Eosinophils Absolute: 0.2 10*3/uL (ref 0.0–0.5)
Eosinophils Relative: 5 %
HCT: 31.8 % — ABNORMAL LOW (ref 39.0–52.0)
Hemoglobin: 9.9 g/dL — ABNORMAL LOW (ref 13.0–17.0)
Immature Granulocytes: 0 %
Lymphocytes Relative: 28 %
Lymphs Abs: 1.3 10*3/uL (ref 0.7–4.0)
MCH: 31.7 pg (ref 26.0–34.0)
MCHC: 31.1 g/dL (ref 30.0–36.0)
MCV: 101.9 fL — ABNORMAL HIGH (ref 80.0–100.0)
Monocytes Absolute: 0.5 10*3/uL (ref 0.1–1.0)
Monocytes Relative: 10 %
Neutro Abs: 2.4 10*3/uL (ref 1.7–7.7)
Neutrophils Relative %: 56 %
Platelets: 172 10*3/uL (ref 150–400)
RBC: 3.12 MIL/uL — ABNORMAL LOW (ref 4.22–5.81)
RDW: 15.8 % — ABNORMAL HIGH (ref 11.5–15.5)
WBC: 4.5 10*3/uL (ref 4.0–10.5)
nRBC: 0 % (ref 0.0–0.2)

## 2022-04-05 LAB — BASIC METABOLIC PANEL
Anion gap: 7 (ref 5–15)
BUN: 26 mg/dL — ABNORMAL HIGH (ref 8–23)
CO2: 25 mmol/L (ref 22–32)
Calcium: 8.9 mg/dL (ref 8.9–10.3)
Chloride: 108 mmol/L (ref 98–111)
Creatinine, Ser: 1.12 mg/dL (ref 0.61–1.24)
GFR, Estimated: >60 mL/min (ref >60)
Glucose, Bld: 95 mg/dL (ref 70–99)
Potassium: 4.7 mmol/L (ref 3.5–5.1)
Sodium: 140 mmol/L (ref 135–145)

## 2022-04-05 NOTE — Discharge Instructions (Signed)
Follow-up with your orthopedist in the office.

## 2022-04-05 NOTE — ED Notes (Signed)
Wound vac removed & dry dressing applied over incisions per Dr. Sharol Given order.

## 2022-04-05 NOTE — Telephone Encounter (Signed)
Noted  

## 2022-04-05 NOTE — ED Notes (Signed)
[facility] called they will be here less than a hour
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
## 2022-04-05 NOTE — ED Provider Notes (Signed)
[facility] Provider Note   CSN: [id number] Arrival date & time: 04/05/22  1015     History  Chief Complaint  Patient presents with   Post-op Problem    [name] is a 72 y.o. male.  72 yo M with a chief complaint of bleeding from a wound with a wound VAC.  This been going on since last night.  Feels like it has been a lot of blood.  Does admit that it filled up his wound VAC system.  He denies any injury to the area.  Felt like the area has been swollen.  Has had no reported fever.        Home Medications Prior to Admission medications   Medication Sig Start Date End Date Taking? Authorizing Provider  acetaminophen (TYLENOL) 500 MG tablet Take 500 mg by mouth every 6 (six) hours as needed for mild pain or fever. Patient not taking: Reported on 12/02/2021    [provider]  apixaban (ELIQUIS) 5 MG TABS tablet Take 1 tablet (5 mg total) by mouth 2 (two) times daily. 12/28/21   Croitoru, Mihai, MD  DESITIN 13 % CREA Apply 1 Application topically 2 (two) times daily as needed. Patient not taking: Reported on 12/02/2021 09/07/21   Wynona Dove A, DO  furosemide (LASIX) 20 MG tablet Take 1 tablet (20 mg total) by mouth daily. 10/26/21   Croitoru, Mihai, MD  HYDROcodone-acetaminophen (NORCO/VICODIN) 5-325 MG tablet Take 1 tablet by mouth every 4 (four) hours as needed. 04/03/22   Newt Minion, MD  levothyroxine (SYNTHROID) 125 MCG tablet Take 125 mcg by mouth daily. 10/30/21   [provider]  methocarbamol (ROBAXIN) 500 MG tablet Take 500 mg by mouth every 6 (six) hours as needed. Patient not taking: Reported on 12/02/2021 09/25/21   [provider]  metoprolol succinate (TOPROL-XL) 25 MG 24 hr tablet Take 1.5 tablets (37.5 mg total) by mouth daily. Take with or immediately following a meal. 12/02/21 11/27/22  Croitoru, Mihai, MD  naphazoline-glycerin (CLEAR EYES REDNESS) 0.012-0.2 % SOLN Place 1-2 drops  into both eyes 4 (four) times daily as needed for eye irritation. Patient not taking: Reported on 12/02/2021 11/21/19   Persons, Bevely Palmer, [place]  potassium chloride (KLOR-CON M) 10 MEQ tablet Take 1 tablet (10 mEq total) by mouth daily. 10/26/21   Croitoru, Mihai, MD  sulfamethoxazole-trimethoprim (BACTRIM DS) 800-160 MG tablet Take 1 tablet by mouth 2 (two) times daily. 04/03/22   Newt Minion, MD      Allergies    Morphine    Review of Systems   Review of Systems  Physical Exam Updated Vital Signs BP 104/60   Pulse 83   Temp 97.6 F (36.4 C) (Oral)   Resp 14   Ht '5\' 8"'$  (1.727 m)   Wt (!) 172.4 kg   SpO2 100%   BMI 57.78 kg/m  Physical Exam Vitals and nursing note reviewed.  Constitutional:      Appearance: He is well-developed.  HENT:     Head: Normocephalic and atraumatic.  Eyes:     Pupils: Pupils are equal, round, and reactive to light.  Neck:     Vascular: No JVD.  Cardiovascular:     Rate and Rhythm: Normal rate and regular rhythm.     Heart sounds: No murmur heard.    No friction rub. No gallop.  Pulmonary:     Effort: No respiratory distress.     Breath sounds:  No wheezing.  Abdominal:     General: There is no distension.     Tenderness: There is no abdominal tenderness. There is no guarding or rebound.  Musculoskeletal:        General: Normal range of motion.     Cervical back: Normal range of motion and neck supple.     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  Sutures in place.  No obvious active bleeding.  Skin:    Coloration: Skin is not pale.     Findings: No rash.  Neurological:     Mental Status: He is alert and oriented to person, place, and time.  Psychiatric:        Behavior: Behavior normal.     ED Results / Procedures / Treatments   Labs (all labs ordered are listed, but only abnormal results are displayed) Labs Reviewed  CBC WITH DIFFERENTIAL/PLATELET - Abnormal; Notable for the following components:      Result Value   RBC 3.12 (*)     Hemoglobin 9.9 (*)    HCT 31.8 (*)    MCV 101.9 (*)    RDW 15.8 (*)    All other components within normal limits  BASIC METABOLIC PANEL - Abnormal; Notable for the following components:   BUN 26 (*)    All other components within normal limits    EKG EKG Interpretation  Date/Time:  Monday April 05 2022 10:25:56 EST Ventricular Rate:  90 PR Interval:    QRS Duration: 129 QT Interval:  374 QTC Calculation: 458 R Axis:   -40 Text Interpretation: Atrial fibrillation Ventricular premature complex Left bundle branch block No significant change since last tracing Confirmed by Deno Etienne [phone]) on 04/05/2022 10:41:51 AM  Radiology DG FEMUR MIN 2 VIEWS LEFT  Result Date: 04/05/2022 CLINICAL DATA:  Postoperative bleeding EXAM: LEFT FEMUR 2 VIEWS COMPARISON:  12/03/2021 FINDINGS: Above the knee amputation. Previously seen intramedullary metallic component has been removed. No specific complicating feature by radiography. IMPRESSION: Above the knee amputation. Removal of previously seen intramedullary component. No specific complicating feature by radiography. Electronically Signed   By: Nelson Chimes M.D.   On: 04/05/2022 11:16    Procedures Procedures    Medications Ordered in ED Medications - No data to display  ED Course/ Medical Decision Making/ A&P                             Medical Decision Making Amount and/or Complexity of Data Reviewed Labs: ordered. Radiology: ordered.   72 yo M with a chief complaints of bleeding after a surgery on his left femur.  Patient unfortunately was just admitted for infected hardware to his left femur.  This was removed.  He was then discharged.  Patient having some bleeding that brought him in.  No obvious significant bleeding on my external exam.  Will obtain a plain film blood work.  Patient has had a 2 g drop in his hemoglobin.  I discussed the case with Hilbert Odor, evaluated at bedside.  Recommended dry dressing and follow-up  with Dr. Sharol Given in the office.  2:42 PM:  I have discussed the diagnosis/risks/treatment options with the patient.  Evaluation and diagnostic testing in the emergency department does not suggest an emergent condition requiring admission or immediate intervention beyond what has been performed at this time.  They will follow up with Ortho. We also discussed returning to the ED immediately if new or worsening sx occur.  We discussed the sx which are most concerning (e.g., sudden worsening pain, fever, inability to tolerate by mouth) that necessitate immediate return. Medications administered to the patient during their visit and any new prescriptions provided to the patient are listed below.  Medications given during this visit Medications - No data to display   The patient appears reasonably screen and/or stabilized for discharge and I doubt any other medical condition or other [facility] requiring further screening, evaluation, or treatment in the ED at this time prior to discharge.          Final Clinical Impression(s) / ED Diagnoses Final diagnoses:  None    Rx / DC Orders ED Discharge Orders     None         Deno Etienne, DO 04/05/22 1442

## 2022-04-05 NOTE — Telephone Encounter (Signed)
Patient called. He went to the hospital instead of coming in today. His call back number is [phone]

## 2022-04-05 NOTE — Consult Note (Signed)
WOC consulted for home VAC and canister Attempted to return phone call as requested in [facility], no answer.  Reviewed EMR. Patient was DC 04/03/22, Dr. Jess Barters patient with Prevena and PLUS canister. Patient reported canister filled with blood at home. Seal on dressing is noted to be broken and leaking. Ortho suggested changing canister, however if seal of dressing is broken and leaking and canister has filled with blood suggested contacting ortho to replace dressing and canister.  Offered support to change canister if ED can obtain Prevena Plus, however WOC does not have access to dressing and new canister (packaged together) This will need to be obtained from the OR and replaced by ortho or orthopedic PA/NP.   Notified ED staff of same  [facility], [place], [place]

## 2022-04-05 NOTE — ED Triage Notes (Signed)
Pt BIB EMS from home. Patient had a surgery on Friday. Patient has wound vac in place. The seal around the wound dressing is broken and the wound is leaking. Patient reports being compliant with all medications post op. Patient takes eliquis and took a hydrocodone at home. Wound continues to ooze.   EMS Vitals 114/82 92 HR 98% RA 18 RR

## 2022-04-05 NOTE — ED Notes (Signed)
Patient transported to X-ray 

## 2022-04-06 ENCOUNTER — Ambulatory Visit (INDEPENDENT_AMBULATORY_CARE_PROVIDER_SITE_OTHER): Payer: Medicare Other | Admitting: Family

## 2022-04-06 ENCOUNTER — Encounter: Payer: Self-pay | Admitting: Family

## 2022-04-06 DIAGNOSIS — T8454XD Infection and inflammatory reaction due to internal left knee prosthesis, subsequent encounter: Secondary | ICD-10-CM

## 2022-04-06 DIAGNOSIS — Z89612 Acquired absence of left leg above knee: Secondary | ICD-10-CM
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
## 2022-04-07 LAB — AEROBIC/ANAEROBIC CULTURE W GRAM STAIN (SURGICAL/DEEP WOUND): Culture: NO GROWTH

## 2022-04-12 ENCOUNTER — Ambulatory Visit: Payer: Medicare Other | Admitting: Orthopedic Surgery

## 2022-04-12 ENCOUNTER — Encounter: Payer: Self-pay | Admitting: Orthopedic Surgery

## 2022-04-12 DIAGNOSIS — Z89612 Acquired absence of left leg above knee: Secondary | ICD-10-CM

## 2022-04-12 DIAGNOSIS — T8454XD Infection and inflammatory reaction due to internal left knee prosthesis, subsequent encounter: Secondary | ICD-10-CM
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
## 2022-04-15 ENCOUNTER — Telehealth: Payer: Self-pay | Admitting: Orthopedic Surgery

## 2022-04-15 NOTE — Telephone Encounter (Signed)
Patient states he was supposed to have a [facility] nurse but has not heard anything or seen anyone since being discharged, he would like to know is he supposed to have one being he is supposed to get his bandage changed and checked and it has not been done recently

## 2022-04-15 NOTE — Telephone Encounter (Signed)
I called pt and advised that he is doing dry dressing changes and that this is not a skilled need. He will have to apply 4x4 and ace or tape and change this daily. The pt states that he will try. He dos have an appt on Monday 04/19/2022 at 10:45 and confirmed that he will come to this appt.

## 2022-04-19 ENCOUNTER — Encounter: Payer: Medicare Other | Admitting: Orthopedic Surgery

## 2022-04-26 ENCOUNTER — Encounter: Payer: Self-pay | Admitting: Orthopedic Surgery

## 2022-04-26 ENCOUNTER — Ambulatory Visit (INDEPENDENT_AMBULATORY_CARE_PROVIDER_SITE_OTHER): Payer: Medicare Other | Admitting: Orthopedic Surgery

## 2022-04-26 DIAGNOSIS — T8454XD Infection and inflammatory reaction due to internal left knee prosthesis, subsequent encounter: Secondary | ICD-10-CM

## 2022-04-26 DIAGNOSIS — Z89612 Acquired absence of left leg above knee: Secondary | ICD-10-CM

## 2022-04-26 DIAGNOSIS — T847XXD Infection and inflammatory reaction due to other internal orthopedic prosthetic devices, implants and grafts, subsequent encounter: Secondary | ICD-10-CM
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
## 2022-05-05 ENCOUNTER — Telehealth: Payer: Self-pay

## 2022-05-05 ENCOUNTER — Ambulatory Visit (INDEPENDENT_AMBULATORY_CARE_PROVIDER_SITE_OTHER): Payer: Medicare Other | Admitting: Family

## 2022-05-05 ENCOUNTER — Encounter: Payer: Self-pay | Admitting: Family

## 2022-05-05 DIAGNOSIS — T8454XD Infection and inflammatory reaction due to internal left knee prosthesis, subsequent encounter: Secondary | ICD-10-CM

## 2022-05-05 NOTE — Telephone Encounter (Signed)
Can you watch for this one to come back from [facility] and see if Latricia Heft will take this referral for [facility]?

## 2022-05-05 NOTE — Telephone Encounter (Signed)
Faxed demo and office note with HHN need in dictation to [place] with [facility]. Will hold message pending advisement if they can accept referral.

## 2022-05-05 NOTE — Telephone Encounter (Signed)
[facility] is not able to take the referral I sent a message to [facility] with [facility] to see if they can staff this request. Will continue to hold and monitor.

## 2022-05-05 NOTE — Telephone Encounter (Signed)
Set up [facility] visits for packing AKA with silvercell.
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
## 2022-05-06 NOTE — Telephone Encounter (Signed)
Called Amy, she says she is not sure if they can take this pt on right now. They have staffed 4 new nurses and they are on their own this week. She will confirm this and will email me back to let me know.

## 2022-05-10 ENCOUNTER — Telehealth: Payer: Self-pay | Admitting: Orthopedic Surgery

## 2022-05-10 NOTE — Telephone Encounter (Signed)
I called [name] and offered appt tomorrow morning and he requested something later in the day. I offered appt with Dr. Sharol Given tomorrow afternoon but did advise that it could be a bit of a wait. The [name] declined and offered appt on Wednesday afternoon with Erin and he was agreeable to this.

## 2022-05-10 NOTE — Telephone Encounter (Signed)
I emailed amy to ask if they have the staff to accept this referral. I have not heard back as of yet so I am going to return pt call and offer an appt for tomorrow.

## 2022-05-10 NOTE — Telephone Encounter (Signed)
Patient advising the packing is still in and asking what he needs to do please call patient [phone]

## 2022-05-10 NOTE — Telephone Encounter (Signed)
Message to [facility] with [facility] to follow up on the referral.Advised to please let me know today if they are able to staff this pt. If not I will make an appt for him to come in this week and we will change the dressing in the office this week.

## 2022-05-12 ENCOUNTER — Ambulatory Visit (INDEPENDENT_AMBULATORY_CARE_PROVIDER_SITE_OTHER): Payer: Medicare Other | Admitting: Family

## 2022-05-12 ENCOUNTER — Encounter: Payer: Self-pay | Admitting: Family

## 2022-05-12 DIAGNOSIS — T847XXD Infection and inflammatory reaction due to other internal orthopedic prosthetic devices, implants and grafts, subsequent encounter: Secondary | ICD-10-CM

## 2022-05-12 DIAGNOSIS — Z89612 Acquired absence of left leg above knee: Secondary | ICD-10-CM
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
## 2022-05-19 ENCOUNTER — Encounter: Payer: Self-pay | Admitting: Family

## 2022-05-19 ENCOUNTER — Ambulatory Visit (INDEPENDENT_AMBULATORY_CARE_PROVIDER_SITE_OTHER): Payer: Medicare Other | Admitting: Family

## 2022-05-19 DIAGNOSIS — T8454XD Infection and inflammatory reaction due to internal left knee prosthesis, subsequent encounter: Secondary | ICD-10-CM

## 2022-05-19 DIAGNOSIS — S78112S Complete traumatic amputation at level between left hip and knee, sequela: Secondary | ICD-10-CM
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
## 2022-05-24 ENCOUNTER — Encounter: Payer: Self-pay | Admitting: Cardiovascular Disease

## 2022-05-24 ENCOUNTER — Ambulatory Visit: Payer: Medicare Other | Attending: Cardiovascular Disease | Admitting: Cardiovascular Disease

## 2022-05-24 VITALS — BP 108/54 | HR 90 | Ht 68.0 in | Wt 380.0 lb

## 2022-05-24 DIAGNOSIS — I4821 Permanent atrial fibrillation: Secondary | ICD-10-CM

## 2022-05-24 DIAGNOSIS — G4733 Obstructive sleep apnea (adult) (pediatric): Secondary | ICD-10-CM

## 2022-05-24 DIAGNOSIS — Z6841 Body Mass Index (BMI) 40.0 and over, adult: Secondary | ICD-10-CM

## 2022-05-24 DIAGNOSIS — D6869 Other thrombophilia: Secondary | ICD-10-CM

## 2022-05-24 DIAGNOSIS — I5022 Chronic systolic (congestive) heart failure: Secondary | ICD-10-CM | POA: Diagnosis not present

## 2022-05-24 DIAGNOSIS — Z89612 Acquired absence of left leg above knee: Secondary | ICD-10-CM

## 2022-05-24 MED ORDER — METOPROLOL SUCCINATE ER 50 MG PO TB24: 50.0000 mg | ORAL_TABLET | Freq: Every day | ORAL | 3 refills | Status: DC

## 2022-05-24 NOTE — Patient Instructions (Signed)
Medication Instructions:  Increase Metoprolol to 50mg  a day *If you need a refill on your cardiac medications before your next appointment, please call your pharmacy*  Testing/Procedures: Your physician has requested that you have an echocardiogram in one month. Echocardiography is a painless test that uses sound waves to create images of your heart. It provides your doctor with information about the size and shape of your heart and how well your heart's chambers and valves are working. This procedure takes approximately one hour. There are no restrictions for this procedure. Please do NOT wear cologne, perfume, aftershave, or lotions (deodorant is allowed). Please arrive 15 minutes prior to your appointment time.    Follow-Up: At [facility], you and your health needs are our priority.  As part of our continuing mission to provide you with exceptional heart care, we have created designated Provider Care Teams.  These Care Teams include your primary Cardiologist (physician) and Advanced Practice Providers (APPs -  Physician Assistants and Nurse Practitioners) who all work together to provide you with the care you need, when you need it.  We recommend signing up for the patient portal called "MyChart".  Sign up information is provided on this After Visit Summary.  MyChart is used to connect with patients for Virtual Visits (Telemedicine).  Patients are able to view lab/test results, encounter notes, upcoming appointments, etc.  Non-urgent messages can be sent to your provider as well.   To learn more about what you can do with MyChart, go to [email or web address].    Your next appointment:   6 month(s)  Provider:   Thurmon Fair, MD
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
## 2022-06-02 ENCOUNTER — Encounter: Payer: Self-pay | Admitting: Family

## 2022-06-02 ENCOUNTER — Ambulatory Visit (INDEPENDENT_AMBULATORY_CARE_PROVIDER_SITE_OTHER): Payer: Medicare Other | Admitting: Family

## 2022-06-02 DIAGNOSIS — Z89612 Acquired absence of left leg above knee: Secondary | ICD-10-CM

## 2022-06-02 DIAGNOSIS — S78112S Complete traumatic amputation at level between left hip and knee, sequela: Secondary | ICD-10-CM
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
## 2022-06-22 ENCOUNTER — Telehealth: Payer: Self-pay | Admitting: Emergency Medicine

## 2022-06-22 ENCOUNTER — Ambulatory Visit (INDEPENDENT_AMBULATORY_CARE_PROVIDER_SITE_OTHER): Payer: Medicare Other

## 2022-06-22 DIAGNOSIS — I5022 Chronic systolic (congestive) heart failure: Secondary | ICD-10-CM | POA: Diagnosis not present

## 2022-06-22 LAB — ECHOCARDIOGRAM COMPLETE
Area-P 1/2: 3.27 cm2
Calc EF: 51.8 %
MV M vel: 3.08 m/s
MV Peak grad: 37.9 mmHg
S' Lateral: 3.42 cm
Single Plane A2C EF: 61.4 %
Single Plane A4C EF: 41.9 %

## 2022-06-22 NOTE — Telephone Encounter (Signed)
Called pt to give results of Echo and recommendations. No answer, left message and call back number.  Croitoru, Mihai, MD  Scheryl Marten, RN Heart pumping function has not rebounded, like it did in the past when we achieved better rate control of the atrial fibrillation.  I wonder whether he is still having periods of rapid heartbeat.  Please increase the metoprolol to 75 mg daily. Please ask if we have made any progress with getting him a new CPAP machine.

## 2022-06-23 MED ORDER — METOPROLOL SUCCINATE ER 50 MG PO TB24: 75.0000 mg | ORAL_TABLET | Freq: Every day | ORAL | 3 refills | Status: DC

## 2022-06-23 NOTE — Telephone Encounter (Signed)
Read Dr Erin Hearing note/message to the patient. He verbalized understanding. He said that he has not noticed his heart beating rapidly or any palpitations.   He already has some Metoprolol Succinate 50 mg tabs and 25 mg tabs; for now, he will use these to equal to 75 mg a day. He will call when/if refills needed.  I asked him about the CPAP, and he says that he has not worked on it much. I encouraged him to work on getting a new CPAP machine. He said that he will try. He says he is working on getting a new wheelchair right now.  Told him to call with any questions/concerns.

## 2022-06-23 NOTE — Addendum Note (Signed)
Addended by: Scheryl Marten on: 06/23/2022 04:21 PM   Modules accepted: Orders

## 2022-06-24 ENCOUNTER — Ambulatory Visit (INDEPENDENT_AMBULATORY_CARE_PROVIDER_SITE_OTHER): Payer: Medicare Other | Admitting: Orthopedic Surgery

## 2022-06-24 DIAGNOSIS — Z89612 Acquired absence of left leg above knee: Secondary | ICD-10-CM

## 2022-06-24 DIAGNOSIS — S78112S Complete traumatic amputation at level between left hip and knee, sequela: Secondary | ICD-10-CM

## 2022-07-05 ENCOUNTER — Encounter: Payer: Self-pay | Admitting: Orthopedic Surgery
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
## 2022-07-09 ENCOUNTER — Telehealth: Payer: Self-pay | Admitting: Cardiovascular Disease

## 2022-07-09 NOTE — Telephone Encounter (Signed)
Pt stated he was supposed to have a order sent to [facility] for a new cpap machine but he received an email regarding supplies only. Pt would like a callback to discuss why the order for the new cpap machine wasn't placed. Please advise
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
## 2022-07-13 ENCOUNTER — Telehealth: Payer: Self-pay | Admitting: Cardiovascular Disease

## 2022-07-13 NOTE — Telephone Encounter (Signed)
Spoke to the patient, he experiences frequent urination at night contribute this to unable sleep at night. Pt stated he does not need a sleep study because his issue is related to his prostate. He has contact urology months ago due to his symptoms, medication was prescribed, however, pt stated the medication does not help. Advised pt to contact urology for frequent urination. Pt stated he will contact PCP for advise with another CPAP machine.

## 2022-07-13 NOTE — Telephone Encounter (Signed)
Patient states he is against having a sleep study because he does not think it would be a fair read. He states that due to prostate issues he gets up to use the bathroom multiple times throughout the night, so he doesn't think the sleep study would give accurate results.

## 2022-10-15 ENCOUNTER — Other Ambulatory Visit: Payer: Self-pay | Admitting: Cardiovascular Disease

## 2022-10-15 DIAGNOSIS — I4819 Other persistent atrial fibrillation: Secondary | ICD-10-CM

## 2022-10-15 NOTE — Telephone Encounter (Signed)
Prescription refill request for Eliquis received. Indication:afib Last office visit:4/24 Scr:1.12  2/24 Age: 72 Weight:172.4  kg  Prescription refilled

## 2022-12-12 ENCOUNTER — Other Ambulatory Visit: Payer: Self-pay | Admitting: Cardiovascular Disease

## 2023-03-17 ENCOUNTER — Other Ambulatory Visit: Payer: Self-pay | Admitting: Cardiovascular Disease

## 2023-05-16 ENCOUNTER — Other Ambulatory Visit: Payer: Self-pay | Admitting: Cardiovascular Disease

## 2023-07-28 ENCOUNTER — Other Ambulatory Visit: Payer: Self-pay | Admitting: Cardiovascular Disease

## 2023-08-02 ENCOUNTER — Other Ambulatory Visit: Payer: Self-pay | Admitting: Cardiovascular Disease

## 2023-10-04 ENCOUNTER — Other Ambulatory Visit: Payer: Self-pay | Admitting: Cardiovascular Disease

## 2023-10-05 MED ORDER — METOPROLOL SUCCINATE ER 50 MG PO TB24: 50.0000 mg | ORAL_TABLET | Freq: Every day | ORAL | 0 refills | Status: DC

## 2023-10-17 ENCOUNTER — Telehealth: Payer: Self-pay | Admitting: Cardiovascular Disease

## 2023-10-17 MED ORDER — METOPROLOL SUCCINATE ER 50 MG PO TB24: 75.0000 mg | ORAL_TABLET | Freq: Every day | ORAL | 0 refills | Status: AC

## 2023-10-17 NOTE — Telephone Encounter (Signed)
*  STAT* If patient is at the pharmacy, call can be transferred to refill team.   1. Which medications need to be refilled? (please list name of each medication and dose if known) metoprolol  succinate (TOPROL -XL) 50 MG 24 hr tablet     metoprolol  succinate (TOPROL -XL) 25 MG 24 hr tablet      2. Which pharmacy/location (including street and city if local pharmacy) is medication to be sent to? [facility] - [place], [place] - [address]   3. Do they need a 30 day or 90 day supply?  90 day supply  Patient requesting a 25 MG and 50 MG dose because he cuts his 50 MG in half to take 75 MG total. He says if unable to prescribe 25 and 50 MG, he will take 50 MG.

## 2023-10-17 NOTE — Telephone Encounter (Signed)
     06/22/22  6:00 PM Note Called pt to give results of Echo and recommendations. No answer, left message and call back number.   Croitoru, Mihai, MD  Davee Comer CROME, RN Heart pumping function has not rebounded, like it did in the past when we achieved better rate control of the atrial fibrillation.  I wonder whether he is still having periods of rapid heartbeat.  Please increase the metoprolol  to 75 mg daily. Please ask if we have made any progress with getting him a new CPAP machine.     Correct dose should be 75 mg daily. Prescription sent in to [facility]

## 2023-10-17 NOTE — Telephone Encounter (Signed)
 Pt of Dr. Francyne. Please advise on this.

## 2023-11-04 ENCOUNTER — Ambulatory Visit
Admission: RE | Admit: 2023-11-04 | Discharge: 2023-11-04 | Disposition: A | Discharge status: Home / Self Care | Source: Ambulatory Visit

## 2023-11-04 VITALS — BP 98/66 | HR 86 | Temp 98.2°F | Resp 20

## 2023-11-04 DIAGNOSIS — I872 Venous insufficiency (chronic) (peripheral): Secondary | ICD-10-CM

## 2023-11-04 DIAGNOSIS — L03115 Cellulitis of right lower limb: Secondary | ICD-10-CM | POA: Diagnosis not present

## 2023-11-04 MED ORDER — MUPIROCIN CALCIUM 2 % EX CREA: 1.0000 | TOPICAL_CREAM | Freq: Two times a day (BID) | CUTANEOUS | 1 refills | Status: DC

## 2023-11-04 MED ORDER — SULFAMETHOXAZOLE-TRIMETHOPRIM 800-160 MG PO TABS: 1.0000 | ORAL_TABLET | Freq: Two times a day (BID) | ORAL | 0 refills | Status: AC

## 2023-11-04 MED ORDER — SILVER SULFADIAZINE 1 % EX CREA: TOPICAL_CREAM | Freq: Once | CUTANEOUS | Status: AC

## 2023-11-04 NOTE — ED Triage Notes (Signed)
 Pt present wound check on right leg, pt states the wound is drain. Symptom started about 8 wks ago.

## 2023-11-04 NOTE — Discharge Instructions (Addendum)
  1. Cellulitis of right lower leg (Primary) - sulfamethoxazole -trimethoprim  (BACTRIM  DS) 800-160 MG tablet; Take 1 tablet by mouth 2 (two) times daily for 10 days.  Dispense: 20 tablet; Refill: 0 - mupirocin  cream (BACTROBAN ) 2 %; Apply 1 Application topically 2 (two) times daily.  Dispense: 30 g; Refill: 1  2. Venous stasis dermatitis - AMB referral to wound care center for proper management and ongoing evaluation of right lower leg venous insufficiency dermatitis. -Continue to monitor symptoms for any change in severity if there is any escalation of current symptoms or development of new symptoms follow-up in ER for further evaluation and management.

## 2023-11-04 NOTE — ED Notes (Signed)
 Pt verbalized understanding to go to ED with any worsening symptoms. Also to contact his PCP to see about setting up home health wound care
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
## 2023-11-04 NOTE — ED Notes (Signed)
 Dressing removed- open area noted to anterior right shin. Scattered white, macerated areas to posterior lower leg.The lower leg is covered in dry, scaling skin. Pt states his cleaning lady helps with the dressings. He has been taking cephalexin  since Monday that was prescribed by his PCP, although he states the provider did not unwrap his leg. States his leg felt hot this morning.

## 2023-11-22 ENCOUNTER — Encounter (HOSPITAL_BASED_OUTPATIENT_CLINIC_OR_DEPARTMENT_OTHER): Attending: Internal Medicine | Admitting: Internal Medicine

## 2023-11-22 DIAGNOSIS — Z89612 Acquired absence of left leg above knee: Secondary | ICD-10-CM | POA: Diagnosis not present

## 2023-11-22 DIAGNOSIS — I87301 Chronic venous hypertension (idiopathic) without complications of right lower extremity: Secondary | ICD-10-CM | POA: Diagnosis not present

## 2023-12-09 ENCOUNTER — Other Ambulatory Visit: Payer: Self-pay | Admitting: Cardiovascular Disease

## 2023-12-12 ENCOUNTER — Other Ambulatory Visit: Payer: Self-pay | Admitting: Cardiovascular Disease

## 2023-12-12 DIAGNOSIS — I4819 Other persistent atrial fibrillation: Secondary | ICD-10-CM

## 2023-12-12 NOTE — Telephone Encounter (Signed)
 Prescription refill request for Eliquis  received. Indication:afib Last office visit:upcoming Scr:1.09  2025 Age: 72 Weight:172.4  kg  Prescription refilled

## 2023-12-13 ENCOUNTER — Encounter (HOSPITAL_BASED_OUTPATIENT_CLINIC_OR_DEPARTMENT_OTHER): Admitting: Internal Medicine

## 2024-01-06 ENCOUNTER — Emergency Department (HOSPITAL_COMMUNITY)
Admission: EM | Admit: 2024-01-06 | Discharge: 2024-01-06 | Disposition: A | Discharge status: Home / Self Care | Attending: Emergency Medicine | Admitting: Emergency Medicine

## 2024-01-06 ENCOUNTER — Encounter (HOSPITAL_COMMUNITY): Payer: Self-pay | Admitting: Emergency Medicine

## 2024-01-06 DIAGNOSIS — B9689 Other specified bacterial agents as the cause of diseases classified elsewhere: Secondary | ICD-10-CM | POA: Diagnosis not present

## 2024-01-06 DIAGNOSIS — Z86718 Personal history of other venous thrombosis and embolism: Secondary | ICD-10-CM | POA: Diagnosis not present

## 2024-01-06 DIAGNOSIS — N390 Urinary tract infection, site not specified: Secondary | ICD-10-CM | POA: Insufficient documentation

## 2024-01-06 DIAGNOSIS — Z7901 Long term (current) use of anticoagulants: Secondary | ICD-10-CM | POA: Diagnosis not present

## 2024-01-06 DIAGNOSIS — N189 Chronic kidney disease, unspecified: Secondary | ICD-10-CM | POA: Insufficient documentation

## 2024-01-06 DIAGNOSIS — R1030 Lower abdominal pain, unspecified: Secondary | ICD-10-CM | POA: Diagnosis present

## 2024-01-06 LAB — URINALYSIS, ROUTINE W REFLEX MICROSCOPIC
Bacteria, UA: NONE SEEN
Bilirubin Urine: NEGATIVE
Glucose, UA: NEGATIVE mg/dL
Ketones, ur: NEGATIVE mg/dL
Leukocytes,Ua: NEGATIVE
Nitrite: NEGATIVE
Protein, ur: 30 mg/dL — AB
RBC / HPF: >50 RBC/hpf (ref 0–5)
Specific Gravity, Urine: 1.013 (ref 1.005–1.030)
pH: 5 (ref 5.0–8.0)

## 2024-01-06 LAB — CBC WITH DIFFERENTIAL/PLATELET
Abs Immature Granulocytes: 0.01 K/uL (ref 0.00–0.07)
Basophils Absolute: 0 K/uL (ref 0.0–0.1)
Basophils Relative: 0 %
Eosinophils Absolute: 0.1 K/uL (ref 0.0–0.5)
Eosinophils Relative: 2 %
HCT: 36.1 % — ABNORMAL LOW (ref 39.0–52.0)
Hemoglobin: 11 g/dL — ABNORMAL LOW (ref 13.0–17.0)
Immature Granulocytes: 0 %
Lymphocytes Relative: 17 %
Lymphs Abs: 0.8 K/uL (ref 0.7–4.0)
MCH: 31 pg (ref 26.0–34.0)
MCHC: 30.5 g/dL (ref 30.0–36.0)
MCV: 101.7 fL — ABNORMAL HIGH (ref 80.0–100.0)
Monocytes Absolute: 0.4 K/uL (ref 0.1–1.0)
Monocytes Relative: 10 %
Neutro Abs: 3.2 K/uL (ref 1.7–7.7)
Neutrophils Relative %: 71 %
Platelets: 195 K/uL (ref 150–400)
RBC: 3.55 MIL/uL — ABNORMAL LOW (ref 4.22–5.81)
RDW: 16.3 % — ABNORMAL HIGH (ref 11.5–15.5)
WBC: 4.5 K/uL (ref 4.0–10.5)
nRBC: 0 % (ref 0.0–0.2)

## 2024-01-06 LAB — COMPREHENSIVE METABOLIC PANEL WITH GFR
ALT: 15 U/L (ref 0–44)
AST: 21 U/L (ref 15–41)
Albumin: 3.7 g/dL (ref 3.5–5.0)
Alkaline Phosphatase: 77 U/L (ref 38–126)
Anion gap: 9 (ref 5–15)
BUN: 23 mg/dL (ref 8–23)
CO2: 27 mmol/L (ref 22–32)
Calcium: 9.9 mg/dL (ref 8.9–10.3)
Chloride: 107 mmol/L (ref 98–111)
Creatinine, Ser: 0.97 mg/dL (ref 0.61–1.24)
GFR, Estimated: >60 mL/min (ref >60)
Glucose, Bld: 105 mg/dL — ABNORMAL HIGH (ref 70–99)
Potassium: 3.9 mmol/L (ref 3.5–5.1)
Sodium: 143 mmol/L (ref 135–145)
Total Bilirubin: 0.7 mg/dL (ref 0.0–1.2)
Total Protein: 7.2 g/dL (ref 6.5–8.1)

## 2024-01-06 MED ORDER — CIPROFLOXACIN HCL 500 MG PO TABS: 500.0000 mg | ORAL_TABLET | Freq: Two times a day (BID) | ORAL | 0 refills | Status: AC

## 2024-01-06 MED ORDER — CIPROFLOXACIN HCL 500 MG PO TABS
500.0000 mg | ORAL_TABLET | Freq: Once | ORAL | Status: AC
  Administered 2024-01-06: 500 mg via ORAL
  Filled 2024-01-06: qty 1

## 2024-01-06 NOTE — ED Triage Notes (Signed)
 Pt arriving via [facility] for possible UTI. Pt reports painful urination and frequency that began approx 2 days ago. Pt also reports urine has a foul odor as well. Pt denies ever having a UTI.

## 2024-01-06 NOTE — Discharge Instructions (Signed)
 Please take antibiotic as prescribed.  Call to schedule appointment for follow-up with your primary care doctor in 2 to 3 days.  I would recommend following up with your primary care for recheck of symptoms and return to emergency room for new or worsening symptoms.

## 2024-01-06 NOTE — ED Provider Notes (Signed)
 [place] EMERGENCY DEPARTMENT AT [facility] Provider Note   CSN: [id number] Arrival date & time: 01/06/24  1603     Patient presents with: painful urination   [name] is a 73 y.o. 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  He reports this started approximately 2 days ago.  He denies any other symptoms.  No fever abdominal pain flank pain. Has never had UTI before.    HPI     Prior to Admission medications   Medication Sig Start Date End Date Taking? Authorizing Provider  acetaminophen  (TYLENOL ) 500 MG tablet Take 500 mg by mouth every 6 (six) hours as needed for mild pain or fever. Patient not taking: Reported on 05/24/2022    [provider]  DESITIN 13 % CREA Apply 1 Application topically 2 (two) times daily as needed. 09/07/21   Elnor Jayson LABOR, DO  ELIQUIS  5 MG TABS tablet TAKE 1 TABLET BY MOUTH TWICE  DAILY 12/12/23   Croitoru, Mihai, MD  furosemide  (LASIX ) 20 MG tablet Take 1 tablet (20 mg total) by mouth daily. 10/26/21   Croitoru, Mihai, MD  HYDROcodone -acetaminophen  (NORCO/VICODIN) 5-325 MG tablet Take 1 tablet by mouth every 4 (four) hours as needed. Patient not taking: Reported on 05/24/2022 04/03/22   Harden Jerona GAILS, MD  levothyroxine  (SYNTHROID ) 125 MCG tablet Take 125 mcg by mouth daily. 10/30/21   [provider]  methocarbamol  (ROBAXIN ) 500 MG tablet Take 500 mg by mouth every 6 (six) hours as needed. Patient not taking: Reported on 05/24/2022 09/25/21   [provider]  metoprolol  succinate (TOPROL -XL) 50 MG 24 hr tablet Take 1.5 tablets (75 mg total) by mouth daily. 10/17/23   Croitoru, Mihai, MD  mupirocin  cream (BACTROBAN ) 2 % Apply 1 Application topically 2 (two) times daily. 11/04/23   Reddick, Johnathan B, NP   potassium chloride  (KLOR-CON  M) 10 MEQ tablet Take 1 tablet (10 mEq total) by mouth daily. 10/26/21   Croitoru, Jerel, MD    Allergies: Morphine    Review of Systems  Genitourinary:  Positive for dysuria.    Updated Vital Signs BP 119/81 (BP Location: Left Arm)   Pulse 88   Temp 98.2 F (36.8 C) (Oral)   Resp 20   SpO2 99%   Physical Exam Vitals and nursing note reviewed.  Constitutional:      General: He is not in acute distress.    Appearance: He is obese. He is not toxic-appearing.  HENT:     Head: Normocephalic and atraumatic.  Eyes:     General: No scleral icterus.    Conjunctiva/sclera: Conjunctivae normal.  Cardiovascular:     Rate and Rhythm: Normal rate and regular rhythm.     Pulses: Normal pulses.     Heart sounds: Normal heart sounds.  Pulmonary:     Effort: Pulmonary effort is normal. No respiratory distress.     Breath sounds: Normal breath sounds.  Abdominal:     General: Abdomen is flat. Bowel sounds are normal.     Palpations: Abdomen is soft.     Tenderness: There is no abdominal tenderness.  Genitourinary:    Comments: GU exam with chaperone, appears normal, no penial discharge/rash.  Musculoskeletal:     Right lower leg: Edema present.     Comments: Chronic appearing RLE edema/rash, neurovasc intact.   Skin:  General: Skin is warm and dry.     Findings: No lesion.  Neurological:     General: No focal deficit present.     Mental Status: He is alert and oriented to person, place, and time. Mental status is at baseline.     (all labs ordered are listed, but only abnormal results are displayed) Labs Reviewed  URINALYSIS, ROUTINE W REFLEX MICROSCOPIC - Abnormal; Notable for the following components:      Result Value   Color, Urine AMBER (*)    APPearance CLOUDY (*)    Hgb urine dipstick MODERATE (*)    Protein, ur 30 (*)    All other components within normal limits  CBC WITH DIFFERENTIAL/PLATELET - Abnormal; Notable for the following  components:   RBC 3.55 (*)    Hemoglobin 11.0 (*)    HCT 36.1 (*)    MCV 101.7 (*)    RDW 16.3 (*)    All other components within normal limits  URINE CULTURE  COMPREHENSIVE METABOLIC PANEL WITH GFR    EKG: None  Radiology: No results found.   Procedures   Medications Ordered in the ED - No data to display  Clinical Course as of 01/06/24 1953  Fri Jan 06, 2024  751 73 year old male here with difficulty urinating and dysuria since last night.  He is wheelchair-bound due to prior amputation.  Chronic leg edema and stasis changes.  Getting labs urinalysis.  Disposition per results of testing [MB]    Clinical Course User Index [MB] Towana Ozell BROCKS, MD                                 Medical Decision Making Amount and/or Complexity of Data Reviewed Labs: ordered.  Risk Prescription drug management.   This patient presents to the ED for concern of UTI, this involves an extensive number of treatment options, and is a complaint that carries with it a high risk of complications and morbidity.  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  The pertinent results include:   UA >50 RBC, 6-10 WBC, due to symptoms with send for culture and treat, suspect blood due to traumatic cath.    Cardiac Monitoring: / EKG:  The patient was maintained on a cardiac monitor.      Problem List / ED Course / Critical interventions / Medication management  Patient presents to emergency room with complaint of painful urination for approximately 2 days.  He also admits to suprapubic pain that is  constant but worse when urinating.  He had 1 episode of urinary incontinence which is new for him.  He denies any fever, chills, penile discharge.  He is hemodynamically stable and chronically ill-appearing.  He does not have any focal abdominal tenderness on exam nor does he have any flank pain or CVA tenderness.  UA with WBCs, RBCs but no bacteria.  I will send for culture and start treating for UTI considering symptoms.  He has no sign of systemic illness.  Lab work is overall reassuring. I ordered medication including first dose of antibiotic.  Reevaluation of the patient  after these medicines showed that the patient improved I have reviewed the patients home medicines and have made adjustments as needed Stable, no altered mental status.  No fever.  No sign of systemic illness feel appropriate for discharge with outpatient follow-up.        Final diagnoses:  Lower urinary tract infection    ED Discharge Orders          Ordered    ciprofloxacin  (CIPRO ) 500 MG tablet  2 times daily        01/06/24 2055               Darcel Zick N, PA-C 01/06/24 2115    Towana Ozell BROCKS, MD 01/07/24 [phone]

## 2024-01-07 LAB — URINE CULTURE: Culture: NO GROWTH

## 2024-01-09 ENCOUNTER — Ambulatory Visit: Admitting: Cardiovascular Disease

## 2024-01-16 ENCOUNTER — Encounter: Payer: Self-pay | Admitting: Internal Medicine

## 2024-01-31 ENCOUNTER — Emergency Department (HOSPITAL_COMMUNITY)

## 2024-01-31 ENCOUNTER — Other Ambulatory Visit: Payer: Self-pay

## 2024-01-31 ENCOUNTER — Encounter (HOSPITAL_COMMUNITY): Payer: Self-pay

## 2024-01-31 ENCOUNTER — Inpatient Hospital Stay (HOSPITAL_COMMUNITY)
Admission: EM | Admit: 2024-01-31 | Discharge: 2024-02-24 | DRG: 291 | Disposition: A | Discharge status: Skilled Nursing Facility | Attending: Internal Medicine | Admitting: Internal Medicine

## 2024-01-31 DIAGNOSIS — D72819 Decreased white blood cell count, unspecified: Secondary | ICD-10-CM | POA: Diagnosis present

## 2024-01-31 DIAGNOSIS — E039 Hypothyroidism, unspecified: Secondary | ICD-10-CM | POA: Diagnosis present

## 2024-01-31 DIAGNOSIS — Z86718 Personal history of other venous thrombosis and embolism: Secondary | ICD-10-CM

## 2024-01-31 DIAGNOSIS — F05 Delirium due to known physiological condition: Secondary | ICD-10-CM | POA: Diagnosis not present

## 2024-01-31 DIAGNOSIS — E662 Morbid (severe) obesity with alveolar hypoventilation: Secondary | ICD-10-CM | POA: Diagnosis present

## 2024-01-31 DIAGNOSIS — J9601 Acute respiratory failure with hypoxia: Secondary | ICD-10-CM | POA: Diagnosis present

## 2024-01-31 DIAGNOSIS — S78112A Complete traumatic amputation at level between left hip and knee, initial encounter: Secondary | ICD-10-CM | POA: Diagnosis not present

## 2024-01-31 DIAGNOSIS — D7589 Other specified diseases of blood and blood-forming organs: Secondary | ICD-10-CM | POA: Diagnosis present

## 2024-01-31 DIAGNOSIS — M549 Dorsalgia, unspecified: Secondary | ICD-10-CM | POA: Diagnosis present

## 2024-01-31 DIAGNOSIS — W19XXXA Unspecified fall, initial encounter: Secondary | ICD-10-CM

## 2024-01-31 DIAGNOSIS — I130 Hypertensive heart and chronic kidney disease with heart failure and stage 1 through stage 4 chronic kidney disease, or unspecified chronic kidney disease: Secondary | ICD-10-CM | POA: Diagnosis present

## 2024-01-31 DIAGNOSIS — Z89612 Acquired absence of left leg above knee: Secondary | ICD-10-CM

## 2024-01-31 DIAGNOSIS — L98499 Non-pressure chronic ulcer of skin of other sites with unspecified severity: Secondary | ICD-10-CM | POA: Diagnosis not present

## 2024-01-31 DIAGNOSIS — Z8249 Family history of ischemic heart disease and other diseases of the circulatory system: Secondary | ICD-10-CM

## 2024-01-31 DIAGNOSIS — I10 Essential (primary) hypertension: Secondary | ICD-10-CM

## 2024-01-31 DIAGNOSIS — Y92009 Unspecified place in unspecified non-institutional (private) residence as the place of occurrence of the external cause: Secondary | ICD-10-CM

## 2024-01-31 DIAGNOSIS — D539 Nutritional anemia, unspecified: Secondary | ICD-10-CM | POA: Diagnosis present

## 2024-01-31 DIAGNOSIS — Z79890 Hormone replacement therapy (postmenopausal): Secondary | ICD-10-CM

## 2024-01-31 DIAGNOSIS — Z23 Encounter for immunization: Secondary | ICD-10-CM | POA: Diagnosis present

## 2024-01-31 DIAGNOSIS — Z993 Dependence on wheelchair: Secondary | ICD-10-CM

## 2024-01-31 DIAGNOSIS — D62 Acute posthemorrhagic anemia: Secondary | ICD-10-CM | POA: Diagnosis present

## 2024-01-31 DIAGNOSIS — D696 Thrombocytopenia, unspecified: Secondary | ICD-10-CM | POA: Diagnosis present

## 2024-01-31 DIAGNOSIS — J9602 Acute respiratory failure with hypercapnia: Secondary | ICD-10-CM | POA: Diagnosis not present

## 2024-01-31 DIAGNOSIS — D631 Anemia in chronic kidney disease: Secondary | ICD-10-CM | POA: Diagnosis present

## 2024-01-31 DIAGNOSIS — T148XXA Other injury of unspecified body region, initial encounter: Secondary | ICD-10-CM

## 2024-01-31 DIAGNOSIS — G4733 Obstructive sleep apnea (adult) (pediatric): Secondary | ICD-10-CM | POA: Diagnosis not present

## 2024-01-31 DIAGNOSIS — Z751 Person awaiting admission to adequate facility elsewhere: Secondary | ICD-10-CM

## 2024-01-31 DIAGNOSIS — Z6841 Body Mass Index (BMI) 40.0 and over, adult: Secondary | ICD-10-CM | POA: Diagnosis not present

## 2024-01-31 DIAGNOSIS — E66813 Obesity, class 3: Secondary | ICD-10-CM | POA: Diagnosis present

## 2024-01-31 DIAGNOSIS — Z885 Allergy status to narcotic agent status: Secondary | ICD-10-CM

## 2024-01-31 DIAGNOSIS — I509 Heart failure, unspecified: Secondary | ICD-10-CM

## 2024-01-31 DIAGNOSIS — I4891 Unspecified atrial fibrillation: Secondary | ICD-10-CM | POA: Diagnosis not present

## 2024-01-31 DIAGNOSIS — I4819 Other persistent atrial fibrillation: Secondary | ICD-10-CM | POA: Diagnosis present

## 2024-01-31 DIAGNOSIS — I5023 Acute on chronic systolic (congestive) heart failure: Secondary | ICD-10-CM | POA: Diagnosis present

## 2024-01-31 DIAGNOSIS — Z7901 Long term (current) use of anticoagulants: Secondary | ICD-10-CM | POA: Diagnosis not present

## 2024-01-31 DIAGNOSIS — J9621 Acute and chronic respiratory failure with hypoxia: Secondary | ICD-10-CM | POA: Diagnosis present

## 2024-01-31 DIAGNOSIS — Z79899 Other long term (current) drug therapy: Secondary | ICD-10-CM

## 2024-01-31 DIAGNOSIS — J189 Pneumonia, unspecified organism: Secondary | ICD-10-CM | POA: Diagnosis present

## 2024-01-31 DIAGNOSIS — R451 Restlessness and agitation: Secondary | ICD-10-CM | POA: Diagnosis not present

## 2024-01-31 DIAGNOSIS — S7012XA Contusion of left thigh, initial encounter: Secondary | ICD-10-CM | POA: Diagnosis present

## 2024-01-31 DIAGNOSIS — W050XXA Fall from non-moving wheelchair, initial encounter: Secondary | ICD-10-CM | POA: Diagnosis present

## 2024-01-31 DIAGNOSIS — I872 Venous insufficiency (chronic) (peripheral): Secondary | ICD-10-CM | POA: Diagnosis present

## 2024-01-31 DIAGNOSIS — Z713 Dietary counseling and surveillance: Secondary | ICD-10-CM

## 2024-01-31 DIAGNOSIS — I5043 Acute on chronic combined systolic (congestive) and diastolic (congestive) heart failure: Secondary | ICD-10-CM

## 2024-01-31 DIAGNOSIS — J9622 Acute and chronic respiratory failure with hypercapnia: Secondary | ICD-10-CM | POA: Diagnosis present

## 2024-01-31 DIAGNOSIS — W19XXXS Unspecified fall, sequela: Secondary | ICD-10-CM | POA: Diagnosis not present

## 2024-01-31 DIAGNOSIS — E875 Hyperkalemia: Secondary | ICD-10-CM | POA: Diagnosis present

## 2024-01-31 DIAGNOSIS — Z89512 Acquired absence of left leg below knee: Secondary | ICD-10-CM

## 2024-01-31 DIAGNOSIS — R0609 Other forms of dyspnea: Secondary | ICD-10-CM | POA: Diagnosis not present

## 2024-01-31 LAB — CBC WITH DIFFERENTIAL/PLATELET
Abs Immature Granulocytes: 0.05 K/uL (ref 0.00–0.07)
Basophils Absolute: 0 K/uL (ref 0.0–0.1)
Basophils Relative: 1 %
Eosinophils Absolute: 0 K/uL (ref 0.0–0.5)
Eosinophils Relative: 1 %
HCT: 31.6 % — ABNORMAL LOW (ref 39.0–52.0)
Hemoglobin: 9.2 g/dL — ABNORMAL LOW (ref 13.0–17.0)
Immature Granulocytes: 1 %
Lymphocytes Relative: 14 %
Lymphs Abs: 0.8 K/uL (ref 0.7–4.0)
MCH: 30.4 pg (ref 26.0–34.0)
MCHC: 29.1 g/dL — ABNORMAL LOW (ref 30.0–36.0)
MCV: 104.3 fL — ABNORMAL HIGH (ref 80.0–100.0)
Monocytes Absolute: 0.6 K/uL (ref 0.1–1.0)
Monocytes Relative: 9 %
Neutro Abs: 4.6 K/uL (ref 1.7–7.7)
Neutrophils Relative %: 74 %
Platelets: 211 K/uL (ref 150–400)
RBC: 3.03 MIL/uL — ABNORMAL LOW (ref 4.22–5.81)
RDW: 17.9 % — ABNORMAL HIGH (ref 11.5–15.5)
WBC: 6.2 K/uL (ref 4.0–10.5)
nRBC: 0.8 % — ABNORMAL HIGH (ref 0.0–0.2)

## 2024-01-31 LAB — BASIC METABOLIC PANEL WITH GFR
Anion gap: 9 (ref 5–15)
BUN: 25 mg/dL — ABNORMAL HIGH (ref 8–23)
CO2: 27 mmol/L (ref 22–32)
Calcium: 9.8 mg/dL (ref 8.9–10.3)
Chloride: 107 mmol/L (ref 98–111)
Creatinine, Ser: 0.91 mg/dL (ref 0.61–1.24)
GFR, Estimated: >60 mL/min
Glucose, Bld: 112 mg/dL — ABNORMAL HIGH (ref 70–99)
Potassium: 4.7 mmol/L (ref 3.5–5.1)
Sodium: 143 mmol/L (ref 135–145)

## 2024-01-31 LAB — HEPATIC FUNCTION PANEL
ALT: 22 U/L (ref 0–44)
AST: 32 U/L (ref 15–41)
Albumin: 3.5 g/dL (ref 3.5–5.0)
Alkaline Phosphatase: 75 U/L (ref 38–126)
Bilirubin, Direct: 0.6 mg/dL — ABNORMAL HIGH (ref 0.0–0.2)
Indirect Bilirubin: 0.7 mg/dL (ref 0.3–0.9)
Total Bilirubin: 1.3 mg/dL — ABNORMAL HIGH (ref 0.0–1.2)
Total Protein: 6.9 g/dL (ref 6.5–8.1)

## 2024-01-31 LAB — I-STAT CHEM 8, ED
BUN: 33 mg/dL — ABNORMAL HIGH (ref 8–23)
Calcium, Ion: 1.28 mmol/L (ref 1.15–1.40)
Chloride: 104 mmol/L (ref 98–111)
Creatinine, Ser: 1 mg/dL (ref 0.61–1.24)
Glucose, Bld: 109 mg/dL — ABNORMAL HIGH (ref 70–99)
HCT: 54 % — ABNORMAL HIGH (ref 39.0–52.0)
Hemoglobin: 18.4 g/dL — ABNORMAL HIGH (ref 13.0–17.0)
Potassium: 4.6 mmol/L (ref 3.5–5.1)
Sodium: 144 mmol/L (ref 135–145)
TCO2: 29 mmol/L (ref 22–32)

## 2024-01-31 LAB — BLOOD GAS, ARTERIAL
Acid-Base Excess: 2.3 mmol/L — ABNORMAL HIGH (ref 0.0–2.0)
Bicarbonate: 29.5 mmol/L — ABNORMAL HIGH (ref 20.0–28.0)
Drawn by: 270211
O2 Content: 3 L/min
O2 Saturation: 95.1 %
Patient temperature: 37
pCO2 arterial: 60 mmHg — ABNORMAL HIGH (ref 32–48)
pH, Arterial: 7.3 — ABNORMAL LOW (ref 7.35–7.45)
pO2, Arterial: 72 mmHg — ABNORMAL LOW (ref 83–108)

## 2024-01-31 LAB — I-STAT CG4 LACTIC ACID, ED
Lactic Acid, Venous: 1.3 mmol/L (ref 0.5–1.9)
Lactic Acid, Venous: 1 mmol/L (ref 0.5–1.9)

## 2024-01-31 LAB — TROPONIN T, HIGH SENSITIVITY
Troponin T High Sensitivity: 36 ng/L — ABNORMAL HIGH (ref 0–19)
Troponin T High Sensitivity: 37 ng/L — ABNORMAL HIGH (ref 0–19)

## 2024-01-31 LAB — PRO BRAIN NATRIURETIC PEPTIDE: Pro Brain Natriuretic Peptide: 1218 pg/mL — ABNORMAL HIGH

## 2024-01-31 LAB — CK: Total CK: 251 U/L (ref 49–397)

## 2024-01-31 MED ORDER — ONDANSETRON HCL 4 MG PO TABS
4.0000 mg | ORAL_TABLET | Freq: Four times a day (QID) | ORAL | Status: DC | PRN
  Administered 2024-02-04: 4 mg via ORAL
  Filled 2024-01-31: qty 1

## 2024-01-31 MED ORDER — FUROSEMIDE 10 MG/ML IJ SOLN
40.0000 mg | Freq: Once | INTRAMUSCULAR | Status: AC
  Administered 2024-01-31: 40 mg via INTRAVENOUS
  Filled 2024-01-31: qty 4

## 2024-01-31 MED ORDER — SODIUM CHLORIDE 0.9 % IV SOLN: 1.0000 g | Freq: Once | INTRAVENOUS | Status: DC

## 2024-01-31 MED ORDER — FUROSEMIDE 10 MG/ML IJ SOLN
40.0000 mg | Freq: Every day | INTRAMUSCULAR | Status: DC
  Administered 2024-02-01 – 2024-02-03 (×3): 40 mg via INTRAVENOUS
  Filled 2024-01-31 (×3): qty 4

## 2024-01-31 MED ORDER — METOPROLOL SUCCINATE ER 50 MG PO TB24
75.0000 mg | ORAL_TABLET | Freq: Every day | ORAL | Status: DC
  Administered 2024-01-31 – 2024-02-23 (×22): 75 mg via ORAL
  Filled 2024-01-31: qty 1
  Filled 2024-01-31 (×2): qty 2
  Filled 2024-01-31: qty 1
  Filled 2024-01-31 (×2): qty 2
  Filled 2024-01-31: qty 1
  Filled 2024-01-31: qty 2
  Filled 2024-01-31: qty 1
  Filled 2024-01-31: qty 2
  Filled 2024-01-31: qty 1
  Filled 2024-01-31 (×5): qty 2

## 2024-01-31 MED ORDER — LEVOTHYROXINE SODIUM 125 MCG PO TABS
125.0000 ug | ORAL_TABLET | Freq: Every day | ORAL | Status: DC
  Administered 2024-02-01 – 2024-02-24 (×22): 125 ug via ORAL
  Filled 2024-01-31 (×17): qty 1

## 2024-01-31 MED ORDER — SENNOSIDES-DOCUSATE SODIUM 8.6-50 MG PO TABS
1.0000 | ORAL_TABLET | Freq: Every evening | ORAL | Status: DC | PRN
  Administered 2024-02-03: 1 via ORAL
  Filled 2024-01-31 (×2): qty 1

## 2024-01-31 MED ORDER — ONDANSETRON HCL 4 MG/2ML IJ SOLN: 4.0000 mg | Freq: Four times a day (QID) | INTRAMUSCULAR | Status: DC | PRN

## 2024-01-31 MED ORDER — IOHEXOL 300 MG/ML  SOLN
100.0000 mL | Freq: Once | INTRAMUSCULAR | Status: AC | PRN
  Administered 2024-01-31: 100 mL via INTRAVENOUS

## 2024-01-31 MED ORDER — SODIUM CHLORIDE 0.9 % IV SOLN
2.0000 g | Freq: Once | INTRAVENOUS | Status: AC
  Administered 2024-01-31: 2 g via INTRAVENOUS
  Filled 2024-01-31: qty 20

## 2024-01-31 MED ORDER — ACETAMINOPHEN 325 MG PO TABS
650.0000 mg | ORAL_TABLET | Freq: Four times a day (QID) | ORAL | Status: DC | PRN
  Administered 2024-02-04 – 2024-02-23 (×17): 650 mg via ORAL
  Filled 2024-01-31 (×13): qty 2

## 2024-01-31 MED ORDER — ACETAMINOPHEN 650 MG RE SUPP: 650.0000 mg | Freq: Four times a day (QID) | RECTAL | Status: DC | PRN

## 2024-01-31 MED ORDER — LACTATED RINGERS IV BOLUS
1000.0000 mL | Freq: Once | INTRAVENOUS | Status: AC
  Administered 2024-01-31: 1000 mL via INTRAVENOUS

## 2024-01-31 MED ORDER — BISACODYL 5 MG PO TBEC
5.0000 mg | DELAYED_RELEASE_TABLET | Freq: Every day | ORAL | Status: DC | PRN
  Administered 2024-02-02 – 2024-02-03 (×2): 5 mg via ORAL
  Filled 2024-01-31 (×2): qty 1

## 2024-01-31 MED ORDER — POTASSIUM CHLORIDE CRYS ER 10 MEQ PO TBCR
10.0000 meq | EXTENDED_RELEASE_TABLET | Freq: Every day | ORAL | Status: DC
  Administered 2024-02-01 – 2024-02-04 (×4): 10 meq via ORAL
  Filled 2024-01-31 (×4): qty 1

## 2024-01-31 NOTE — ED Notes (Signed)
 02 sats are varying from 82-95 with good pleth. Resp notified to check pt.

## 2024-01-31 NOTE — ED Triage Notes (Addendum)
 BIB EMS from home due to a fall, was on the ground for 8 hours, home aide found him. Pt complaints of backpain which is chronic. Pt is on eliquis . VS with EMS 112/palpated, spo2 90% RA, EMS placed on 3L [place], HR 90, denies LOC. Hx of afib.  Pt states he fell trying to move from his wheelchair to bed
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
## 2024-01-31 NOTE — ED Notes (Signed)
 Respiratory called. They are on the way

## 2024-01-31 NOTE — ED Notes (Addendum)
+  2 bilateral radial pulses, right DP pulse heard by doppler

## 2024-01-31 NOTE — Progress Notes (Signed)
 Pt taken off bipap per md
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
## 2024-01-31 NOTE — Progress Notes (Addendum)
 Patient not in room to place BiPAP at this time. Will place on patient when patient returns from CT.

## 2024-01-31 NOTE — Plan of Care (Signed)

## 2024-01-31 NOTE — ED Provider Notes (Signed)
 " [place] EMERGENCY DEPARTMENT AT [facility] Provider Note   CSN: [id number] Arrival date & time: 01/31/24  1105     Patient presents with: [name] is a 73 y.o. male.   73 year old male presents for evaluation of fall.  Was found on the floor at home, states he laid on the floor for 8 hours and was unable to get up.  He states he is post be using a CPAP but does not have one.  He states he is short of breath is also complaining of neck and entire back pain from the fall.  States he has had an increased cough lately that has been productive.  Denies any other symptoms or concerns.   Fall Pertinent negatives include no chest pain, no abdominal pain and no shortness of breath.       Prior to Admission medications  Medication Sig Start Date End Date Taking? Authorizing Provider  acetaminophen  (TYLENOL ) 500 MG tablet Take 500 mg by mouth every 6 (six) hours as needed for mild pain or fever. Patient not taking: Reported on 05/24/2022    [provider]  DESITIN 13 % CREA Apply 1 Application topically 2 (two) times daily as needed. 09/07/21   Elnor Savant A, DO  ELIQUIS  5 MG TABS tablet TAKE 1 TABLET BY MOUTH TWICE  DAILY 12/12/23   Croitoru, Mihai, MD  furosemide  (LASIX ) 20 MG tablet Take 1 tablet (20 mg total) by mouth daily. 10/26/21   Croitoru, Mihai, MD  HYDROcodone -acetaminophen  (NORCO/VICODIN) 5-325 MG tablet Take 1 tablet by mouth every 4 (four) hours as needed. Patient not taking: Reported on 05/24/2022 04/03/22   Harden Jerona GAILS, MD  levothyroxine  (SYNTHROID ) 125 MCG tablet Take 125 mcg by mouth daily. 10/30/21   [provider]  methocarbamol  (ROBAXIN ) 500 MG tablet Take 500 mg by mouth every 6 (six) hours as needed. Patient not taking: Reported on 05/24/2022 09/25/21   [provider]  metoprolol  succinate (TOPROL -XL) 50 MG 24 hr tablet Take 1.5 tablets (75 mg total) by mouth daily. 10/17/23   Croitoru, Mihai, MD  mupirocin  cream  (BACTROBAN ) 2 % Apply 1 Application topically 2 (two) times daily. 11/04/23   Reddick, Johnathan B, NP  potassium chloride  (KLOR-CON  M) 10 MEQ tablet Take 1 tablet (10 mEq total) by mouth daily. 10/26/21   Croitoru, Mihai, MD    Allergies: Morphine    Review of Systems  Constitutional:  Positive for fatigue. Negative for chills and fever.  HENT:  Negative for ear pain and sore throat.   Eyes:  Negative for pain and visual disturbance.  Respiratory:  Positive for cough. Negative for shortness of breath.   Cardiovascular:  Negative for chest pain and palpitations.  Gastrointestinal:  Negative for abdominal pain and vomiting.  Genitourinary:  Negative for dysuria and hematuria.  Musculoskeletal:  Positive for back pain. Negative for arthralgias.  Skin:  Negative for color change and rash.  Neurological:  Negative for seizures and syncope.  All other systems reviewed and are negative.   Updated Vital Signs BP 125/82   Pulse 100   Temp 97.7 F (36.5 C)   Resp 20   Ht 5' 8 (1.727 m)   Wt (!) 216.6 kg   SpO2 100%   BMI 72.61 kg/m   Physical Exam Vitals and nursing note reviewed.  Constitutional:      General: He is not in acute distress.    Appearance: Normal appearance. He is well-developed. He is  obese. He is ill-appearing.  HENT:     Head: Normocephalic and atraumatic.  Eyes:     Conjunctiva/sclera: Conjunctivae normal.  Cardiovascular:     Rate and Rhythm: Regular rhythm. Tachycardia present.     Heart sounds: No murmur heard. Pulmonary:     Effort: Pulmonary effort is normal. No respiratory distress.     Breath sounds: Normal breath sounds.  Abdominal:     Palpations: Abdomen is soft.     Tenderness: There is no abdominal tenderness.  Musculoskeletal:        General: No swelling.     Cervical back: Neck supple.  Skin:    General: Skin is warm and dry.     Capillary Refill: Capillary refill takes less than 2 seconds.  Neurological:     General: No focal deficit  present.     Mental Status: He is alert.  Psychiatric:        Mood and Affect: Mood normal.     (all labs ordered are listed, but only abnormal results are displayed) Labs Reviewed  BASIC METABOLIC PANEL WITH GFR - Abnormal; Notable for the following components:      Result Value   Glucose, Bld 112 (*)    BUN 25 (*)    All other components within normal limits  HEPATIC FUNCTION PANEL - Abnormal; Notable for the following components:   Total Bilirubin 1.3 (*)    Bilirubin, Direct 0.6 (*)    All other components within normal limits  CBC WITH DIFFERENTIAL/PLATELET - Abnormal; Notable for the following components:   RBC 3.03 (*)    Hemoglobin 9.2 (*)    HCT 31.6 (*)    MCV 104.3 (*)    MCHC 29.1 (*)    RDW 17.9 (*)    nRBC 0.8 (*)    All other components within normal limits  BLOOD GAS, ARTERIAL - Abnormal; Notable for the following components:   pH, Arterial 7.3 (*)    pCO2 arterial 60 (*)    pO2, Arterial 72 (*)    Bicarbonate 29.5 (*)    Acid-Base Excess 2.3 (*)    All other components within normal limits  PRO BRAIN NATRIURETIC PEPTIDE - Abnormal; Notable for the following components:   Pro Brain Natriuretic Peptide 1,218.0 (*)    All other components within normal limits  I-STAT CHEM 8, ED - Abnormal; Notable for the following components:   BUN 33 (*)    Glucose, Bld 109 (*)    Hemoglobin 18.4 (*)    HCT 54.0 (*)    All other components within normal limits  TROPONIN T, HIGH SENSITIVITY - Abnormal; Notable for the following components:   Troponin T High Sensitivity 36 (*)    All other components within normal limits  TROPONIN T, HIGH SENSITIVITY - Abnormal; Notable for the following components:   Troponin T High Sensitivity 37 (*)    All other components within normal limits  RESP PANEL BY RT-PCR (RSV, FLU A&B, COVID)  RVPGX2  CK  URINALYSIS, W/ REFLEX TO CULTURE (INFECTION SUSPECTED)  I-STAT CG4 LACTIC ACID, ED  I-STAT CG4 LACTIC ACID, ED    EKG: EKG  Interpretation Date/Time:  Tuesday January 31 2024 11:27:27 EST Ventricular Rate:  101 PR Interval:    QRS Duration:  135 QT Interval:  377 QTC Calculation: 489 R Axis:   -26  Text Interpretation: Atrial fibrillation Left bundle branch block Compared with prior EKG from 04/05/2022 Confirmed by Gennaro Bouchard ([id number]) on 01/31/2024 11:44:54 AM  Radiology: CT CHEST ABDOMEN PELVIS W CONTRAST Result Date: 01/31/2024 CLINICAL DATA:  Fall and back pain. EXAM: CT CHEST, ABDOMEN, AND PELVIS WITH CONTRAST TECHNIQUE: Multidetector CT imaging of the chest, abdomen and pelvis was performed following the standard protocol during bolus administration of intravenous contrast. RADIATION DOSE REDUCTION: This exam was performed according to the departmental dose-optimization program which includes automated exposure control, adjustment of the mA and/or kV according to patient size and/or use of iterative reconstruction technique. CONTRAST:  OMNIPAQUE  IOHEXOL  300 MG/ML  SOLN COMPARISON:  CT dated 09/07/2021. FINDINGS: Evaluation is limited due to streak artifact caused by body habitus and patient's arms. CT CHEST FINDINGS Cardiovascular: Mild cardiomegaly. No pericardial effusion. There is coronary vascular calcification of the LAD. Mild atherosclerotic calcification of the thoracic aorta. No aneurysm dilatation or dissection. The central pulmonary arteries appear patent as visualized. Mediastinum/Nodes: No hilar or mediastinal adenopathy. The esophagus and the thyroid  gland are grossly unremarkable. No mediastinal fluid collection. Lungs/Pleura: Bilateral lower lobe predominant streaky atelectasis. Pneumonia is less likely but not excluded. No consolidative changes. There is no pleural effusion pneumothorax. The central airways are patent. Musculoskeletal: Degenerative changes of the spine. No acute osseous pathology. CT ABDOMEN PELVIS FINDINGS No intra-abdominal free air or free fluid. Hepatobiliary: The liver is  unremarkable. No biliary dilatation. Multiple gallstones. No pericholecystic fluid or evidence of acute cholecystitis by CT. Pancreas: Unremarkable. No pancreatic ductal dilatation or surrounding inflammatory changes. Spleen: Normal in size without focal abnormality. Adrenals/Urinary Tract: The adrenal glands unremarkable. There is no hydronephrosis on either side. The visualized ureters and urinary bladder appear unremarkable. Stomach/Bowel: Moderate stool throughout the colon. There is no bowel obstruction or active inflammation. The appendix is normal. Vascular/Lymphatic: Mild aortoiliac atherosclerotic disease. The IVC is unremarkable. No portal venous gas. No adenopathy. Reproductive: The prostate and seminal vesicles are grossly unremarkable Other: Intramuscular hematoma in the anterior compartment of the proximal left thigh involving the sartorius muscle measures approximately 3.5 x 5.3 cm in greatest axial dimensions and 17 cm in craniocaudal length. Musculoskeletal: Osteopenia with degenerative changes of the spine. No acute osseous pathology. IMPRESSION: 1. Intramuscular hematoma in the anterior compartment of the proximal left thigh. 2. No acute/traumatic intrathoracic, abdominal, or pelvic pathology. 3. Cholelithiasis. 4.  Aortic Atherosclerosis (ICD10-I70.0). Electronically Signed   By: Vanetta Chou M.D.   On: 01/31/2024 15:49   CT T-SPINE NO CHARGE Result Date: 01/31/2024 CLINICAL DATA:  Fall and back pain. EXAM: CT THORACIC AND LUMBAR SPINE WITHOUT CONTRAST TECHNIQUE: Multidetector CT imaging of the thoracic and lumbar spine was performed without contrast. Multiplanar CT image reconstructions were also generated. RADIATION DOSE REDUCTION: This exam was performed according to the departmental dose-optimization program which includes automated exposure control, adjustment of the mA and/or kV according to patient size and/or use of iterative reconstruction technique. COMPARISON:  CT chest abdomen  pelvis dated 09/07/2021. FINDINGS: Evaluation is limited due to advanced osteopenia, degenerative changes, and soft tissue attenuation and motion. CT THORACIC SPINE FINDINGS Alignment: No acute subluxation. Vertebrae: No obvious fracture.  Osteopenia. Paraspinal and other soft tissues: Negative. Disc levels: Multilevel degenerative changes with bridging osteophyte. CT LUMBAR SPINE FINDINGS Segmentation: 5 lumbar type vertebra. Alignment: Normal. Vertebrae: No obvious acute fracture.  Osteopenia. Paraspinal and other soft tissues: Negative. Disc levels: No acute findings.  Multilevel degenerative changes. IMPRESSION: 1. No obvious acute/traumatic thoracic or lumbar spine pathology. 2. Osteopenia and degenerative changes. Electronically Signed   By: Vanetta Chou M.D.   On: 01/31/2024 15:39   CT L-SPINE  NO CHARGE Result Date: 01/31/2024 CLINICAL DATA:  Fall and back pain. EXAM: CT THORACIC AND LUMBAR SPINE WITHOUT CONTRAST TECHNIQUE: Multidetector CT imaging of the thoracic and lumbar spine was performed without contrast. Multiplanar CT image reconstructions were also generated. RADIATION DOSE REDUCTION: This exam was performed according to the departmental dose-optimization program which includes automated exposure control, adjustment of the mA and/or kV according to patient size and/or use of iterative reconstruction technique. COMPARISON:  CT chest abdomen pelvis dated 09/07/2021. FINDINGS: Evaluation is limited due to advanced osteopenia, degenerative changes, and soft tissue attenuation and motion. CT THORACIC SPINE FINDINGS Alignment: No acute subluxation. Vertebrae: No obvious fracture.  Osteopenia. Paraspinal and other soft tissues: Negative. Disc levels: Multilevel degenerative changes with bridging osteophyte. CT LUMBAR SPINE FINDINGS Segmentation: 5 lumbar type vertebra. Alignment: Normal. Vertebrae: No obvious acute fracture.  Osteopenia. Paraspinal and other soft tissues: Negative. Disc levels: No  acute findings.  Multilevel degenerative changes. IMPRESSION: 1. No obvious acute/traumatic thoracic or lumbar spine pathology. 2. Osteopenia and degenerative changes. Electronically Signed   By: Vanetta Chou M.D.   On: 01/31/2024 15:39   CT Head Wo Contrast Result Date: 01/31/2024 EXAM: CT HEAD AND CERVICAL SPINE 01/31/2024 01:30:00 PM TECHNIQUE: CT of the head and cervical spine was performed without the administration of intravenous contrast. Multiplanar reformatted images are provided for review. Automated exposure control, iterative reconstruction, and/or weight based adjustment of the mA/kV was utilized to reduce the radiation dose to as low as reasonably achievable. COMPARISON: None available. CLINICAL HISTORY: fall, hit head FINDINGS: CT HEAD BRAIN AND VENTRICLES: No acute intracranial hemorrhage. No mass effect or midline shift. No abnormal extra-axial fluid collection. No evidence of acute infarct. No hydrocephalus. ORBITS: No acute abnormality. SINUSES AND MASTOIDS: No acute abnormality. SOFT TISSUES AND SKULL: No acute skull fracture. No acute soft tissue abnormality. CT CERVICAL SPINE Limited study due to motion and beam hardening artifact. Within this limitation: BONES AND ALIGNMENT: No acute fracture or traumatic malalignment. DEGENERATIVE CHANGES: Severe degenerative change including degenerative disc disease and facet/uncovertebral hypertrophy with varying degrees of neural foraminal stenosis. SOFT TISSUES: No prevertebral soft tissue swelling. LUNGS: See concurrent CT of the chest for intrathoracic evaluation. IMPRESSION: 1. No acute intracranial abnormality. 2. Limited CT of the cervical spine without evidence of acute fracture or traumatic malalignment. 3. Severe cervical degenerative change. Electronically signed by: Gilmore Molt 01/31/2024 02:26 PM EST RP Workstation: [id number]   CT Cervical Spine Wo Contrast Result Date: 01/31/2024 EXAM: CT HEAD AND CERVICAL SPINE 01/31/2024  01:30:00 PM TECHNIQUE: CT of the head and cervical spine was performed without the administration of intravenous contrast. Multiplanar reformatted images are provided for review. Automated exposure control, iterative reconstruction, and/or weight based adjustment of the mA/kV was utilized to reduce the radiation dose to as low as reasonably achievable. COMPARISON: None available. CLINICAL HISTORY: fall, hit head FINDINGS: CT HEAD BRAIN AND VENTRICLES: No acute intracranial hemorrhage. No mass effect or midline shift. No abnormal extra-axial fluid collection. No evidence of acute infarct. No hydrocephalus. ORBITS: No acute abnormality. SINUSES AND MASTOIDS: No acute abnormality. SOFT TISSUES AND SKULL: No acute skull fracture. No acute soft tissue abnormality. CT CERVICAL SPINE Limited study due to motion and beam hardening artifact. Within this limitation: BONES AND ALIGNMENT: No acute fracture or traumatic malalignment. DEGENERATIVE CHANGES: Severe degenerative change including degenerative disc disease and facet/uncovertebral hypertrophy with varying degrees of neural foraminal stenosis. SOFT TISSUES: No prevertebral soft tissue swelling. LUNGS: See concurrent CT of the chest for intrathoracic evaluation.  IMPRESSION: 1. No acute intracranial abnormality. 2. Limited CT of the cervical spine without evidence of acute fracture or traumatic malalignment. 3. Severe cervical degenerative change. Electronically signed by: Gilmore Molt 01/31/2024 02:26 PM EST RP Workstation: [id number]   DG Chest 1 View Result Date: 01/31/2024 CLINICAL DATA:  Shortness of breath. EXAM: CHEST  1 VIEW COMPARISON:  Chest radiograph dated 09/14/2021. FINDINGS: There is cardiomegaly with vascular congestion. No focal consolidation, pleural effusion, pneumothorax. No acute osseous pathology. IMPRESSION: Cardiomegaly with vascular congestion. Electronically Signed   By: Vanetta Chou M.D.   On: 01/31/2024 13:40     .Critical  Care  Performed by: Gennaro Duwaine CROME, DO Authorized by: Gennaro Duwaine CROME, DO   Critical care provider statement:    Critical care time (minutes):  35   Critical care time was exclusive of:  Separately billable procedures and treating other patients and teaching time   Critical care was necessary to treat or prevent imminent or life-threatening deterioration of the following conditions:  Respiratory failure   Critical care was time spent personally by me on the following activities:  Development of treatment plan with patient or surrogate, discussions with consultants, evaluation of patient's response to treatment, examination of patient, ordering and review of laboratory studies, ordering and review of radiographic studies, ordering and performing treatments and interventions, pulse oximetry, re-evaluation of patient's condition and review of old charts   Care discussed with: admitting provider      Medications Ordered in the ED  cefTRIAXone  (ROCEPHIN ) 1 g in sodium chloride  0.9 % 100 mL IVPB (has no administration in time range)  lactated ringers  bolus 1,000 mL (1,000 mLs Intravenous New Bag/Given 01/31/24 1154)  iohexol  (OMNIPAQUE ) 300 MG/ML solution 100 mL (100 mLs Intravenous Contrast Given 01/31/24 1404)  furosemide  (LASIX ) injection 40 mg (40 mg Intravenous Given 01/31/24 1610)                                    Medical Decision Making Cardiac monitor interpretation: A-fib, rapid rate  Social determinants of health: Lives in a nursing home, morbid obesity  Patient here after he laid on the ground after a fall for 8 hours.  Was found to be hypoxic and initially placed on 3 L nasal cannula.  States he supposed be wearing a CPAP at night but has not been as he does not have a machine.  On his ABG he was found to have an elevated CO2 of 60 and was slightly somnolent so was started on BiPAP with improvement in his breathing and alertness.  Was initially given a liter of IV fluids as he  was more tachycardic.  Labs fairly unremarkable except for mildly elevated but flat troponin, and elevated BNP.  Chest x-ray does not appear to show some pulmonary edema.  Also does show some possible pneumonia on CT scan.  Imaging unremarkable for traumatic injury.  We have the patient a dose of Lasix  as well.  Discussed patient case with hospitalist patient will be admitted for further workup and management.  Patient is agreeable with the plan.  Problems Addressed: Acute respiratory failure with hypoxia and hypercapnia (HCC): acute illness or injury that poses a threat to life or bodily functions Back pain, unspecified back location, unspecified back pain laterality, unspecified chronicity: acute illness or injury Congestive heart failure, unspecified HF chronicity, unspecified heart failure type [facility]): acute illness or injury that poses a threat to life  or bodily functions Fall, initial encounter: acute illness or injury  Amount and/or Complexity of Data Reviewed External Data Reviewed: notes.    Details: Prior ED records reviewed and patient is a 12-29-2023 for UTI Labs: ordered. Decision-making details documented in ED Course.    Details: Ordered and reviewed by me and troponins are flat but slightly elevated, BNP is elevated Radiology: ordered and independent interpretation performed. Decision-making details documented in ED Course.    Details: Ordered and interpreted by me independently of radiology Chest x-ray: Shows pulmonary edema CT of the chest abdomen pelvis: Shows no traumatic injury but does show some pulmonary edema, does show possible pneumonia CT head: Shows no acute abnormality CT T-spine, L-spine, C-spine, showed no acute fracture ECG/medicine tests: ordered and independent interpretation performed. Decision-making details documented in ED Course.    Details: Ordered and interpreted by me in the absence of cardiology and shows atrial fibrillation, no STEMI or significant  change with compared to prior EKG Discussion of management or test interpretation with external provider(s): Dr. Lou - hospitalist -I spoke with him regarding the patient's case and he will admit the patient for further workup and management  Risk OTC drugs. Prescription drug management. Drug therapy requiring intensive monitoring for toxicity. Decision regarding hospitalization. Diagnosis or treatment significantly limited by social determinants of health. Risk Details: CRITICAL CARE Performed by: Duwaine LITTIE Fusi   Total critical care time: 35 minutes  Critical care time was exclusive of separately billable procedures and treating other patients.  Critical care was necessary to treat or prevent imminent or life-threatening deterioration.  Critical care was time spent personally by me on the following activities: development of treatment plan with patient and/or surrogate as well as nursing, discussions with consultants, evaluation of patient's response to treatment, examination of patient, obtaining history from patient or surrogate, ordering and performing treatments and interventions, ordering and review of laboratory studies, ordering and review of radiographic studies, pulse oximetry and re-evaluation of patient's condition.   Critical Care Total time providing critical care: 35 minutes    Final diagnoses:  Acute respiratory failure with hypoxia and hypercapnia (HCC)  Congestive heart failure, unspecified HF chronicity, unspecified heart failure type (HCC)  Fall, initial encounter  Back pain, unspecified back location, unspecified back pain laterality, unspecified chronicity  Pneumonia due to infectious organism, unspecified laterality, unspecified part of lung    ED Discharge Orders     None          Fusi Duwaine LITTIE, DO 01/31/24 1657  "

## 2024-02-01 ENCOUNTER — Inpatient Hospital Stay (HOSPITAL_COMMUNITY)

## 2024-02-01 DIAGNOSIS — I10 Essential (primary) hypertension: Secondary | ICD-10-CM

## 2024-02-01 DIAGNOSIS — T148XXA Other injury of unspecified body region, initial encounter: Secondary | ICD-10-CM

## 2024-02-01 DIAGNOSIS — R0609 Other forms of dyspnea: Secondary | ICD-10-CM | POA: Diagnosis not present

## 2024-02-01 DIAGNOSIS — J9602 Acute respiratory failure with hypercapnia: Secondary | ICD-10-CM

## 2024-02-01 DIAGNOSIS — I5023 Acute on chronic systolic (congestive) heart failure: Secondary | ICD-10-CM

## 2024-02-01 DIAGNOSIS — J9601 Acute respiratory failure with hypoxia: Secondary | ICD-10-CM | POA: Diagnosis not present

## 2024-02-01 DIAGNOSIS — Z6841 Body Mass Index (BMI) 40.0 and over, adult: Secondary | ICD-10-CM | POA: Diagnosis not present

## 2024-02-01 LAB — BLOOD GAS, VENOUS
Acid-Base Excess: 4.8 mmol/L — ABNORMAL HIGH (ref 0.0–2.0)
Acid-Base Excess: 6.5 mmol/L — ABNORMAL HIGH (ref 0.0–2.0)
Bicarbonate: 35.1 mmol/L — ABNORMAL HIGH (ref 20.0–28.0)
Bicarbonate: 34.8 mmol/L — ABNORMAL HIGH (ref 20.0–28.0)
O2 Saturation: 63.3 %
O2 Saturation: 92.7 %
Patient temperature: 37
Patient temperature: 36.9
pCO2, Ven: 82 mmHg (ref 44–60)
pCO2, Ven: 74 mmHg (ref 44–60)
pH, Ven: 7.24 — ABNORMAL LOW (ref 7.25–7.43)
pH, Ven: 7.28 (ref 7.25–7.43)
pO2, Ven: 39 mmHg (ref 32–45)
pO2, Ven: 64 mmHg — ABNORMAL HIGH (ref 32–45)

## 2024-02-01 LAB — CBC
HCT: 31 % — ABNORMAL LOW (ref 39.0–52.0)
Hemoglobin: 8.8 g/dL — ABNORMAL LOW (ref 13.0–17.0)
MCH: 29.9 pg (ref 26.0–34.0)
MCHC: 28.4 g/dL — ABNORMAL LOW (ref 30.0–36.0)
MCV: 105.4 fL — ABNORMAL HIGH (ref 80.0–100.0)
Platelets: 217 K/uL (ref 150–400)
RBC: 2.94 MIL/uL — ABNORMAL LOW (ref 4.22–5.81)
RDW: 18.5 % — ABNORMAL HIGH (ref 11.5–15.5)
WBC: 5.7 K/uL (ref 4.0–10.5)
nRBC: 1.4 % — ABNORMAL HIGH (ref 0.0–0.2)

## 2024-02-01 LAB — URINALYSIS, W/ REFLEX TO CULTURE (INFECTION SUSPECTED)
Bilirubin Urine: NEGATIVE
Glucose, UA: NEGATIVE mg/dL
Hgb urine dipstick: NEGATIVE
Ketones, ur: NEGATIVE mg/dL
Leukocytes,Ua: NEGATIVE
Nitrite: NEGATIVE
Protein, ur: NEGATIVE mg/dL
Specific Gravity, Urine: 1.009 (ref 1.005–1.030)
pH: 5 (ref 5.0–8.0)

## 2024-02-01 LAB — ECHOCARDIOGRAM COMPLETE
Area-P 1/2: 9.85 cm2
Height: 68 in
S' Lateral: 3.9 cm
Single Plane A4C EF: 48.8 %
Weight: 7640.26 [oz_av]

## 2024-02-01 LAB — BASIC METABOLIC PANEL WITH GFR
Anion gap: 7 (ref 5–15)
BUN: 20 mg/dL (ref 8–23)
CO2: 31 mmol/L (ref 22–32)
Calcium: 9.6 mg/dL (ref 8.9–10.3)
Chloride: 107 mmol/L (ref 98–111)
Creatinine, Ser: 0.92 mg/dL (ref 0.61–1.24)
GFR, Estimated: >60 mL/min
Glucose, Bld: 123 mg/dL — ABNORMAL HIGH (ref 70–99)
Potassium: 4.4 mmol/L (ref 3.5–5.1)
Sodium: 145 mmol/L (ref 135–145)

## 2024-02-01 LAB — RESP PANEL BY RT-PCR (RSV, FLU A&B, COVID)  RVPGX2
Influenza A by PCR: NEGATIVE
Influenza B by PCR: NEGATIVE
Resp Syncytial Virus by PCR: NEGATIVE
SARS Coronavirus 2 by RT PCR: NEGATIVE

## 2024-02-01 LAB — MRSA NEXT GEN BY PCR, NASAL: MRSA by PCR Next Gen: NOT DETECTED

## 2024-02-01 LAB — MAGNESIUM: Magnesium: 2.4 mg/dL (ref 1.7–2.4)

## 2024-02-01 MED ORDER — HYDROCERIN EX CREA
TOPICAL_CREAM | Freq: Two times a day (BID) | CUTANEOUS | Status: DC
  Administered 2024-02-01 – 2024-02-17 (×10): 1 via TOPICAL
  Filled 2024-02-01 (×3): qty 113

## 2024-02-01 MED ORDER — PERFLUTREN LIPID MICROSPHERE
1.0000 mL | INTRAVENOUS | Status: AC | PRN
  Administered 2024-02-01: 2 mL via INTRAVENOUS
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
## 2024-02-01 NOTE — Progress Notes (Signed)
" °   02/01/24 2353  BiPAP/CPAP/SIPAP  BiPAP/CPAP/SIPAP Pt Type Adult  BiPAP/CPAP/SIPAP SERVO  Mask Type Full face mask  Dentures removed? Not applicable  Mask Size Small  Set Rate 12 breaths/min  Respiratory Rate 27 breaths/min  IPAP 18 cmH20  EPAP 6 cmH2O  Pressure Support 12 cmH20  PEEP 6 cmH20  FiO2 (%) 40 %  Minute Ventilation 10.4  Leak 83  Peak Inspiratory Pressure (PIP) 17  Tidal Volume (Vt) 5323  Patient Home Machine No  Patient Home Mask No  Patient Home Tubing No  Auto Titrate No  Press High Alarm 25 cmH2O  Press Low Alarm 5 cmH2O  Device Plugged into RED Power Outlet Yes    "

## 2024-02-01 NOTE — Consult Note (Signed)
 WOC Nurse Consult Note: patient with history of venous insufficiency with resultant recurrent ulcerations; last seen at wound care center 11/2023 when wounds were deemed healed  Reason for Consult: R lower leg wound  Wound type: full thickness R medial leg pink moist likely r/t venous insufficiency  Pressure Injury POA: NA  Measurement: 3 cm x 3 cm x 0.1 cm  Wound bed: pink moist  Drainage (amount, consistency, odor) minimal serosanguinous  Periwound: edema dry hyperkeratotic skin  Dressing procedure/placement/frequency: Cleanse R lower leg/foot with soap and water , dry and apply Eucerin to intact skin.  Cleanse R medial lower leg ulcer with NS, apply silver  hydrofiber Soila ([phone] [employer]) cut to fit and secure with ABD pad and Kerlix roll gauze.   POC discussed with primary nurse. WOC team will not follow. Reconsult if further needs arise.   Thank you,    Powell Bar MSN, RN-BC, [employer]

## 2024-02-02 DIAGNOSIS — J9601 Acute respiratory failure with hypoxia: Secondary | ICD-10-CM | POA: Diagnosis not present

## 2024-02-02 DIAGNOSIS — I10 Essential (primary) hypertension: Secondary | ICD-10-CM | POA: Diagnosis not present

## 2024-02-02 DIAGNOSIS — T148XXA Other injury of unspecified body region, initial encounter: Secondary | ICD-10-CM | POA: Diagnosis not present

## 2024-02-02 DIAGNOSIS — I5023 Acute on chronic systolic (congestive) heart failure: Secondary | ICD-10-CM | POA: Diagnosis not present

## 2024-02-02 LAB — IRON AND TIBC
Iron: 66 ug/dL (ref 45–182)
Saturation Ratios: 22 % (ref 17.9–39.5)
TIBC: 302 ug/dL (ref 250–450)
UIBC: 236 ug/dL

## 2024-02-02 LAB — BLOOD GAS, VENOUS
Acid-Base Excess: 7.1 mmol/L — ABNORMAL HIGH (ref 0.0–2.0)
Bicarbonate: 36.5 mmol/L — ABNORMAL HIGH (ref 20.0–28.0)
O2 Saturation: 97.5 %
Patient temperature: 37
pCO2, Ven: 76 mmHg (ref 44–60)
pH, Ven: 7.29 (ref 7.25–7.43)
pO2, Ven: 76 mmHg — ABNORMAL HIGH (ref 32–45)

## 2024-02-02 LAB — CBC
HCT: 29.8 % — ABNORMAL LOW (ref 39.0–52.0)
Hemoglobin: 8.4 g/dL — ABNORMAL LOW (ref 13.0–17.0)
MCH: 30 pg (ref 26.0–34.0)
MCHC: 28.2 g/dL — ABNORMAL LOW (ref 30.0–36.0)
MCV: 106.4 fL — ABNORMAL HIGH (ref 80.0–100.0)
Platelets: 202 K/uL (ref 150–400)
RBC: 2.8 MIL/uL — ABNORMAL LOW (ref 4.22–5.81)
RDW: 18 % — ABNORMAL HIGH (ref 11.5–15.5)
WBC: 5.4 K/uL (ref 4.0–10.5)
nRBC: 2.2 % — ABNORMAL HIGH (ref 0.0–0.2)

## 2024-02-02 LAB — COMPREHENSIVE METABOLIC PANEL WITH GFR
ALT: 20 U/L (ref 0–44)
AST: 28 U/L (ref 15–41)
Albumin: 3.4 g/dL — ABNORMAL LOW (ref 3.5–5.0)
Alkaline Phosphatase: 71 U/L (ref 38–126)
Anion gap: 7 (ref 5–15)
BUN: 19 mg/dL (ref 8–23)
CO2: 30 mmol/L (ref 22–32)
Calcium: 9.6 mg/dL (ref 8.9–10.3)
Chloride: 104 mmol/L (ref 98–111)
Creatinine, Ser: 1 mg/dL (ref 0.61–1.24)
GFR, Estimated: >60 mL/min
Glucose, Bld: 132 mg/dL — ABNORMAL HIGH (ref 70–99)
Potassium: 4.6 mmol/L (ref 3.5–5.1)
Sodium: 141 mmol/L (ref 135–145)
Total Bilirubin: 0.8 mg/dL (ref 0.0–1.2)
Total Protein: 6.6 g/dL (ref 6.5–8.1)

## 2024-02-02 LAB — RETICULOCYTES
Immature Retic Fract: 32.3 % — ABNORMAL HIGH (ref 2.3–15.9)
RBC.: 2.65 MIL/uL — ABNORMAL LOW (ref 4.22–5.81)
Retic Count, Absolute: 136.2 K/uL (ref 19.0–186.0)
Retic Ct Pct: 5.1 % — ABNORMAL HIGH (ref 0.4–3.1)

## 2024-02-02 LAB — FERRITIN: Ferritin: 140 ng/mL (ref 24–336)

## 2024-02-02 LAB — FOLATE: Folate: >20 ng/mL

## 2024-02-02 LAB — CK: Total CK: 61 U/L (ref 49–397)

## 2024-02-02 LAB — MAGNESIUM: Magnesium: 2.5 mg/dL — ABNORMAL HIGH (ref 1.7–2.4)

## 2024-02-02 LAB — VITAMIN B12: Vitamin B-12: 1386 pg/mL — ABNORMAL HIGH (ref 180–914)
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
## 2024-02-02 NOTE — Progress Notes (Signed)
 Orthopedic Tech Progress Note Patient Details:  [name] February 27, 1950 [id number]  Pt will be unable to receive the overhead trapeze frame d/t contraindications for safe use; weight >/= 113.3kg and age >/= 37.   Patient ID: [name], male   DOB: 1950-11-12, 73 y.o.   MRN: [id number]  [name] 02/02/2024, 2:11 PM
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
## 2024-02-02 NOTE — Progress Notes (Signed)
" °   02/02/24 0530  BiPAP/CPAP/SIPAP  BiPAP/CPAP/SIPAP Pt Type Adult  BiPAP/CPAP/SIPAP SERVO  Mask Type Full face mask  Dentures removed? Not applicable  Mask Size Small  Set Rate 18 breaths/min  Respiratory Rate 21 breaths/min  IPAP 20 cmH20  EPAP 6 cmH2O  Pressure Support 14 cmH20  PEEP 6 cmH20  FiO2 (%) 40 %  Minute Ventilation 8.7  Leak 70  Peak Inspiratory Pressure (PIP) 20  Tidal Volume (Vt) 503  Patient Home Machine No  Patient Home Mask No  Patient Home Tubing No  Auto Titrate No  Press High Alarm 25 cmH2O  Press Low Alarm 5 cmH2O  Device Plugged into RED Power Outlet Yes    "

## 2024-02-03 DIAGNOSIS — I10 Essential (primary) hypertension: Secondary | ICD-10-CM | POA: Diagnosis not present

## 2024-02-03 DIAGNOSIS — T148XXA Other injury of unspecified body region, initial encounter: Secondary | ICD-10-CM | POA: Diagnosis not present

## 2024-02-03 DIAGNOSIS — J9601 Acute respiratory failure with hypoxia: Secondary | ICD-10-CM | POA: Diagnosis not present

## 2024-02-03 DIAGNOSIS — I5023 Acute on chronic systolic (congestive) heart failure: Secondary | ICD-10-CM | POA: Diagnosis not present

## 2024-02-03 MED ORDER — ORAL CARE MOUTH RINSE: 15.0000 mL | OROMUCOSAL | Status: DC | PRN

## 2024-02-03 MED ORDER — APIXABAN 5 MG PO TABS
5.0000 mg | ORAL_TABLET | Freq: Two times a day (BID) | ORAL | Status: DC
  Administered 2024-02-03 – 2024-02-23 (×40): 5 mg via ORAL
  Filled 2024-02-03 (×26): qty 1

## 2024-02-03 MED ORDER — ENSURE PLUS HIGH PROTEIN PO LIQD
237.0000 mL | Freq: Two times a day (BID) | ORAL | Status: DC
  Administered 2024-02-03 – 2024-02-08 (×6): 237 mL via ORAL

## 2024-02-03 MED ORDER — POLYETHYLENE GLYCOL 3350 17 G PO PACK
17.0000 g | PACK | Freq: Two times a day (BID) | ORAL | Status: DC | PRN
  Administered 2024-02-04: 17 g via ORAL
  Filled 2024-02-03: qty 1

## 2024-02-03 MED ORDER — SENNOSIDES-DOCUSATE SODIUM 8.6-50 MG PO TABS
2.0000 | ORAL_TABLET | Freq: Two times a day (BID) | ORAL | Status: DC | PRN
  Administered 2024-02-04: 2 via ORAL
  Filled 2024-02-03: qty 2

## 2024-02-03 MED ORDER — FLEET ENEMA RE ENEM
1.0000 | ENEMA | Freq: Every day | RECTAL | Status: DC | PRN
  Administered 2024-02-03: 1 via RECTAL
  Filled 2024-02-03: qty 1

## 2024-02-03 MED ORDER — TORSEMIDE 20 MG PO TABS
40.0000 mg | ORAL_TABLET | Freq: Every day | ORAL | Status: DC
  Administered 2024-02-04: 40 mg via ORAL
  Filled 2024-02-03: qty 2

## 2024-02-03 NOTE — Progress Notes (Signed)
 Heart Failure Navigator Progress Note  Assessed for Heart & Vascular TOC clinic readiness.  Patient does not meet criteria due to EF 50-55%, per MD note patient wheel chair dependent. Has a scheduled [facility] appointment on 04/03/24. No HF TOC. .   Navigator will sign off at this time.   Stephane Haddock, BSN, Scientist, Clinical (histocompatibility And Immunogenetics) Only

## 2024-02-03 NOTE — Evaluation (Signed)
 Occupational Therapy Evaluation Patient Details Name: [name] MRN: [id number] DOB: 12/06/1950 Today's Date: 02/03/2024   History of Present Illness   Patient is 73y.o. male admitted from home after a fall from his wheelchair and was down for ~8 hours. Reported some leg swelling and SOB. CT chest, abdomen and pelvis showed intramuscular hematoma in the anterior compartment of R thigh. Other significant PMH includes morbid obesity, L AKA, A-fib, hypothyroidism, GERD, headaches, CHF.     Clinical Impressions PTA, patient lives at home in an apartment alone, w/c dependent with hospital bed and not on supplemental O2 at baseline. Patient has limited caregiver support- a friend who assists 1 day per week and a hired caregiver another day 1x per week. Uses delivery for groceries and w/c transport. Currently, patient presents with deficits outlined below (see OT Problem List for details) most significantly pain, decreased insight into deficits, increased body habitus, supplemental O2 dependency with poor activity tolerance, decreased balance, skin integrity, UE ROM, strength and coordination limiting BADL's (max-total A LB) and functional mobility (maximove transfer safety) performance. Patient will benefit from continued inpatient follow up therapy, <3 hours/day. Patient requires continued Acute care hospital level OT services to progress safety and functional performance and allow for discharge.       If plan is discharge home, recommend the following:   Two people to help with walking and/or transfers;Two people to help with bathing/dressing/bathroom;Assistance with cooking/housework;Direct supervision/assist for medications management;Direct supervision/assist for financial management;Assist for transportation;Help with stairs or ramp for entrance;Supervision due to cognitive status     Functional Status Assessment   Patient has had a recent decline in their functional status and  demonstrates the ability to make significant improvements in function in a reasonable and predictable amount of time.     Equipment Recommendations   Hoyer lift (bariatric)      Precautions/Restrictions   Precautions Precautions: Fall Recall of Precautions/Restrictions: Intact Restrictions [employer] Bearing Restrictions Per Provider Order: No     Mobility Bed Mobility Overal bed mobility: Needs Assistance Bed Mobility: Rolling Rolling: Max assist, +2 for physical assistance, +2 for safety/equipment, Used rails   Supine to sit: Total assist, +2 for physical assistance Sit to supine: Total assist, +2 for physical assistance   General bed mobility comments: trapeze was undelivered due to weight limits    Transfers                   General transfer comment: will require maximove (patient ~> 450 lbs)      Balance Overall balance assessment: Needs assistance, History of Falls   Sitting balance-Leahy Scale: Poor Sitting balance - Comments: significant effort to trial due to rectal discomfort                                   ADL either performed or assessed with clinical judgement   ADL Overall ADL's : Needs assistance/impaired Eating/Feeding: Set up;Bed level   Grooming: Wash/dry hands;Wash/dry face;Minimal assistance;Bed level   Upper Body Bathing: Moderate assistance;Bed level   Lower Body Bathing: Total assistance;Bed level   Upper Body Dressing : Moderate assistance;Bed level   Lower Body Dressing: Total assistance;+2 for physical assistance;+2 for safety/equipment;Bed level   Toilet Transfer: Total assistance Toilet Transfer Details (indicate cue type and reason): will need maximove Toileting- Clothing Manipulation and Hygiene: Total assistance;Bed level Toileting - Clothing Manipulation Details (indicate cue type and reason): unable to reach buttocks  for hygiene     Functional mobility during ADLs: Total assistance General ADL  Comments: poor activity tolerance due to rectal pain and l8imited activity tolerance and strength for mobility     Vision Patient Visual Report: No change from baseline              Pertinent Vitals/Pain Pain Assessment Pain Assessment: Faces Faces Pain Scale: Hurts even more Pain Location: trying to have BM, BUE shoulders to wrists, increases with movement Pain Descriptors / Indicators: Constant, Dull, Pressure Pain Intervention(s): Monitored during session, Limited activity within patient's tolerance, Repositioned, Utilized relaxation techniques, Relaxation, Other (comment) (nursign notified of constipation despite bed pan positioning)     Extremity/Trunk Assessment Upper Extremity Assessment Upper Extremity Assessment: Generalized weakness;Right hand dominant;RUE deficits/detail;LUE deficits/detail RUE Deficits / Details: limited ROM with increased pain about 90deg shoulder flexion, hypersensitivity to touch at wrist and fingers RUE Sensation:  (hypersensitivity) RUE Coordination: decreased fine motor LUE Deficits / Details: decreased ROM, limited to roughly 60deg flexion, attempted PROM however pain limiting LUE Coordination: decreased fine motor   Lower Extremity Assessment Lower Extremity Assessment: Defer to PT evaluation   Cervical / Trunk Assessment Cervical / Trunk Assessment: Other exceptions (significant increased body habitus, abdominal girth and L AKA with poor functional reach)   Communication Communication Communication: Impaired Factors Affecting Communication: Hearing impaired   Cognition Arousal: Alert Behavior During Therapy: WFL for tasks assessed/performed Cognition: Cognition impaired     Awareness: Intellectual awareness impaired, Online awareness impaired   Attention impairment (select first level of impairment): Selective attention Executive functioning impairment (select all impairments): Problem solving, Reasoning OT - Cognition Comments: poor  insight into deficits for need for assist and rehab prior to home                 Following commands: Intact       Cueing  General Comments   Cueing Techniques: Verbal cues;Tactile cues  R lower leg gauze dressing over wound, on 2 trs O2 moved from [employer] after nap but nursing with SpO2 93% and HR 106 bpm during bed pan use           Home Living Family/patient expects to be discharged to:: Private residence Living Arrangements: Alone Available Help at Discharge: Friend(s);Personal care attendant (1x per week from [facility] and a friend for 2x total per week) Type of Home: Apartment Home Access: Level entry (ramp over threshold)     Home Layout: One level     Bathroom Shower/Tub: Sponge bathes at baseline   [employer]:  (bed pan ot w/c to toilet) Bathroom Accessibility: Yes   Home Equipment: Wheelchair - manual;Other (comment);Rolling Walker (2 wheels);Hospital bed (transfer board)   Additional Comments: caregiver and friend help with IADLs, [employer] for transportation, no O2 at baseline      Prior Functioning/Environment Prior Level of Function : Needs assist             Mobility Comments: IND with w/c transfers using slide board, fell while transferring and down for 8hrs ADLs Comments: IND with ADLs, caregiver and friend help with IADLs, uses [place] for transportation    OT Problem List: Decreased strength;Decreased range of motion;Decreased activity tolerance;Impaired balance (sitting and/or standing);Decreased cognition;Decreased coordination;Decreased safety awareness;Decreased knowledge of use of DME or AE;Decreased knowledge of precautions;Cardiopulmonary status limiting activity;Obesity;Impaired UE functional use;Pain;Increased edema   OT Treatment/Interventions: Self-care/ADL training;Therapeutic exercise;Neuromuscular education;DME and/or AE instruction;Energy conservation;Manual therapy;Therapeutic activities;Cognitive  remediation/compensation;Patient/family education;Balance training      OT Goals(Current goals can  be found in the care plan section)   Acute Rehab OT Goals Patient Stated Goal: to go home with help OT Goal Formulation: With patient Time For Goal Achievement: 02/17/24 Potential to Achieve Goals: Fair   OT Frequency:  Min 2X/week       AM-PAC OT 6 Clicks Daily Activity     Outcome Measure Help from another person eating meals?: A Lot Help from another person taking care of personal grooming?: A Lot Help from another person toileting, which includes using toliet, bedpan, or urinal?: Total Help from another person bathing (including washing, rinsing, drying)?: Total Help from another person to put on and taking off regular upper body clothing?: A Lot Help from another person to put on and taking off regular lower body clothing?: Total 6 Click Score: 9   End of Session Equipment Utilized During Treatment: Oxygen (maxi slide) Nurse Communication: Mobility status;Other (comment) (nursing aware on bed pan still with request for stool softener)  Activity Tolerance: Patient limited by pain;Patient limited by fatigue Patient left: in bed;with call bell/phone within reach;with bed alarm set  OT Visit Diagnosis: Unsteadiness on feet (R26.81);Other abnormalities of gait and mobility (R26.89);History of falling (Z91.81);Muscle weakness (generalized) (M62.81);Cognitive communication deficit (R41.841);Pain Pain - part of body: Leg;Arm;Hand;Shoulder (rectum)                Time: 8449-8374 OT Time Calculation (min): 35 min Charges:  OT General Charges $OT Visit: 1 Visit OT Evaluation $OT Eval Low Complexity: 1 Low OT Treatments $Self Care/Home Management : 8-22 mins  Ceil Roderick OT/L Acute Rehabilitation Department  [phone]  02/03/2024, 4:43 PM
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
## 2024-02-03 NOTE — TOC Initial Note (Signed)
 Transition of Care [facility]) - Initial/Assessment Note    Patient Details  Name: [name] MRN: [id number] Date of Birth: March 27, 1950  Transition of Care [facility]) CM/SW Contact:    Tawni CHRISTELLA Eva, LCSW Phone Number: 02/03/2024, 12:21 PM  Clinical Narrative:                  CSW met with the pt at bedside to discuss recommendations for SNF Placement. The pt has declined and prefers to return home with home health services. Pt reports having an aide who comes in once a week and a friend who helps periodically. He mentioned that he previously lived at [facility], an assisted living facility, but is now in his own apartment. Pt reports using his wheelchair as usual. He is currently on oxygen, but not at baseline. Will follow for O2 needs. ICM to follow.   Expected Discharge Plan: Home w Home Health Services Barriers to Discharge: Continued Medical Work up   Patient Goals and CMS Choice Patient states their goals for this hospitalization and ongoing recovery are:: retrun home with home health CMS Medicare.gov Compare Post Acute Care list provided to:: Patient Choice offered to / list presented to : Patient      Expected Discharge Plan and Services       Living arrangements for the past 2 months: Apartment                                      Prior Living Arrangements/Services Living arrangements for the past 2 months: Apartment Lives with:: Self Patient language and need for interpreter reviewed:: Yes Do you feel safe going back to the place where you live?: Yes      Need for Family Participation in Patient Care: No (Comment) Care giver support system in place?: No (comment)      Activities of Daily Living   ADL Screening (condition at time of admission) Independently performs ADLs?: No Does the patient have a NEW difficulty with bathing/dressing/toileting/self-feeding that is expected to last >3 days?: No Does the patient have a NEW difficulty with  getting in/out of bed, walking, or climbing stairs that is expected to last >3 days?: No Does the patient have a NEW difficulty with communication that is expected to last >3 days?: No Is the patient deaf or have difficulty hearing?: No Does the patient have difficulty seeing, even when wearing glasses/contacts?: No Does the patient have difficulty concentrating, remembering, or making decisions?: No  Permission Sought/Granted                  Emotional Assessment Appearance:: Appears stated age Attitude/Demeanor/Rapport: Gracious Affect (typically observed): Accepting Orientation: : Oriented to Place, Oriented to  Time, Oriented to Situation, Oriented to Self      Admission diagnosis:  Acute respiratory failure with hypoxia (HCC) [J96.01] Fall, initial encounter [W19.XXXA] Acute respiratory failure with hypoxia and hypercapnia (HCC) [J96.01, J96.02] Pneumonia due to infectious organism, unspecified laterality, unspecified part of lung [J18.9] Back pain, unspecified back location, unspecified back pain laterality, unspecified chronicity [M54.9] Congestive heart failure, unspecified HF chronicity, unspecified heart failure type [facility]) [I50.9] Patient Active Problem List   Diagnosis Date Noted   Intramuscular hematoma 01/31/2024   Essential hypertension 01/31/2024   Acute on chronic systolic CHF (congestive heart failure) (HCC) 01/31/2024   Fall 01/31/2024   S/P AKA (above knee amputation) unilateral, left (HCC) 04/02/2022   Dehiscence of  amputation stump of left lower extremity (HCC) 04/02/2022   Unilateral AKA, left (HCC) 09/15/2021   Hypokalemia 09/14/2021   Left leg cellulitis 09/14/2021   Chronic systolic CHF (congestive heart failure) (HCC) 09/14/2021   Fall at home, initial encounter 09/14/2021   Acute on chronic combined systolic and diastolic CHF (congestive heart failure) (HCC) 04/17/2021   Physical deconditioning 04/17/2021   Cellulitis of groin, right 04/14/2021    Pressure injury of skin 04/14/2021   Acute respiratory failure with hypoxia and hypercapnia (HCC) 01/19/2021   Hypothyroidism    Macrocytic anemia    Morbid obesity with BMI of 60.0-69.9, adult (HCC)    Pneumonia due to COVID-19 virus 01/15/2021   Cellulitis of left leg    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)    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   Septic joint of left knee joint (HCC) 11/06/2019   Infection of total knee replacement 08/15/2019   Open knee wound 08/14/2019   S/P left TK revision 04/03/2019   Administration of long-term prophylactic antibiotics    History of streptococcal infection    History of DVT (deep vein thrombosis)    S/P rev left TK 12/15/2015   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   Preoperative cardiovascular examination 04/17/2013   OSA (obstructive sleep apnea)-3 L of oxygen at night and not CPAP 11/27/2012   Chronic anticoagulation, with Xarelto  09/10/2012   DVT (deep venous thrombosis), possible 09/10/2012   SOB (shortness of breath) 08/26/2012   Chest discomfort 08/26/2012   Persistent atrial fibrillation (HCC) 08/26/2012   Morbid obesity with BMI of 70 and over, adult (HCC) 06/27/2012   Expected blood loss anemia 06/27/2012   S/P left TK revision 06/23/2012   Septic arthritis of knee (HCC)    Cellulitis 09/19/2010   Methicillin resistant Staphylococcus aureus infection 09/10/2009   STREPTOCOCCUS INFECTION CCE [facility] UNS SITE GROUP C 07/04/2008   CHRONIC KIDNEY DISEASE UNSPECIFIED 07/04/2008   INFECTION DUE TO INTERNAL ORTH DEVICE NEC 03/02/2006   PCP:  Pura Lenis, MD Pharmacy:   [facility] [phone] - [place], [place] - [address] AT [address] [place] [address] Phone: [phone] Fax: [phone]     Social Drivers of Health (SDOH) Social History: SDOH Screenings   Food Insecurity: No Food Insecurity (04/13/2023)   Received from [facility]  Housing: Low Risk (04/13/2023)    Received from [facility]  Transportation Needs: No Transportation Needs (04/13/2023)   Received from [facility]  Utilities: Not At Risk (04/13/2023)   Received from [facility]  Financial Resource Strain: Low Risk (04/13/2023)   Received from [facility]  Physical Activity: Unknown (04/13/2023)   Received from [facility]  Social Connections: Socially Integrated (04/13/2023)   Received from [facility]  Stress: Stress Concern Present (04/13/2023)   Received from [facility]  Tobacco Use: Low Risk (01/31/2024)   SDOH Interventions:     Readmission Risk Interventions     No data to display

## 2024-02-03 NOTE — Plan of Care (Signed)
Updated patient's sister over the phone

## 2024-02-03 NOTE — Progress Notes (Signed)
 1630- Pt complaining of severe pain/discomfort while trying to defecate. MD made aware. Stool softeners given.   1730- Pt has attempted to defecate multiple times with no progress. Pt complains of pain every time and said It feels like there is a hard lump there. MD Gonfa made aware.  1745- Pt passed several small hard lumps of stool. Some stool was still stuck. Disimpaction done to assist pt.  1807- Enema given to pt. Pt instructed to hold as long as he could.  1830-Pt turned back onto bedpan.   1840-Pt passed a large hard stool.

## 2024-02-03 NOTE — Progress Notes (Signed)
" °   02/03/24 2146  BiPAP/CPAP/SIPAP  BiPAP/CPAP/SIPAP Pt Type Adult  BiPAP/CPAP/SIPAP SERVO  Mask Type Full face mask  Dentures removed? Not applicable  Mask Size Small  Set Rate 12 breaths/min  Respiratory Rate 27 breaths/min  IPAP 18 cmH20  EPAP 6 cmH2O  Pressure Support 12 cmH20  PEEP 6 cmH20  FiO2 (%) 40 %  Minute Ventilation 12.1  Leak 51  Peak Inspiratory Pressure (PIP) 19  Tidal Volume (Vt) 501  Patient Home Machine No  Patient Home Mask No  Patient Home Tubing No  Auto Titrate No  Press High Alarm 30 cmH2O  Press Low Alarm 5 cmH2O  CPAP/SIPAP surface wiped down Yes  Device Plugged into RED Power Outlet Yes  BiPAP/CPAP /SiPAP Vitals  Resp (!) 27  MEWS Score/Color  MEWS Score 2  MEWS Score Color Yellow    "

## 2024-02-04 DIAGNOSIS — I10 Essential (primary) hypertension: Secondary | ICD-10-CM | POA: Diagnosis not present

## 2024-02-04 DIAGNOSIS — I5023 Acute on chronic systolic (congestive) heart failure: Secondary | ICD-10-CM | POA: Diagnosis not present

## 2024-02-04 DIAGNOSIS — J9601 Acute respiratory failure with hypoxia: Secondary | ICD-10-CM | POA: Diagnosis not present

## 2024-02-04 DIAGNOSIS — T148XXA Other injury of unspecified body region, initial encounter: Secondary | ICD-10-CM | POA: Diagnosis not present

## 2024-02-04 LAB — RENAL FUNCTION PANEL
Albumin: 3.1 g/dL — ABNORMAL LOW (ref 3.5–5.0)
Anion gap: 8 (ref 5–15)
BUN: 23 mg/dL (ref 8–23)
CO2: 31 mmol/L (ref 22–32)
Calcium: 9.7 mg/dL (ref 8.9–10.3)
Chloride: 100 mmol/L (ref 98–111)
Creatinine, Ser: 0.94 mg/dL (ref 0.61–1.24)
GFR, Estimated: >60 mL/min
Glucose, Bld: 111 mg/dL — ABNORMAL HIGH (ref 70–99)
Phosphorus: 3.2 mg/dL (ref 2.5–4.6)
Potassium: 4.3 mmol/L (ref 3.5–5.1)
Sodium: 139 mmol/L (ref 135–145)

## 2024-02-04 LAB — CBC
HCT: 27.9 % — ABNORMAL LOW (ref 39.0–52.0)
Hemoglobin: 8.2 g/dL — ABNORMAL LOW (ref 13.0–17.0)
MCH: 30.7 pg (ref 26.0–34.0)
MCHC: 29.4 g/dL — ABNORMAL LOW (ref 30.0–36.0)
MCV: 104.5 fL — ABNORMAL HIGH (ref 80.0–100.0)
Platelets: 173 K/uL (ref 150–400)
RBC: 2.67 MIL/uL — ABNORMAL LOW (ref 4.22–5.81)
RDW: 18.3 % — ABNORMAL HIGH (ref 11.5–15.5)
WBC: 5.1 K/uL (ref 4.0–10.5)
nRBC: 1.4 % — ABNORMAL HIGH (ref 0.0–0.2)

## 2024-02-04 LAB — BLOOD GAS, VENOUS
Acid-Base Excess: 12.3 mmol/L — ABNORMAL HIGH (ref 0.0–2.0)
Bicarbonate: 42.2 mmol/L — ABNORMAL HIGH (ref 20.0–28.0)
O2 Saturation: 97.3 %
Patient temperature: 36.8
pCO2, Ven: 81 mmHg (ref 44–60)
pH, Ven: 7.32 (ref 7.25–7.43)
pO2, Ven: 76 mmHg — ABNORMAL HIGH (ref 32–45)

## 2024-02-04 LAB — MAGNESIUM: Magnesium: 2.5 mg/dL — ABNORMAL HIGH (ref 1.7–2.4)
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
## 2024-02-04 NOTE — Progress Notes (Signed)
 MD notified of Critical Lab Value of pCO2, Ven 81. Patient was placed back on BiPap. From what this RN got in report from night shift RN, patient had been on 2 liters but was on BiPap during the night and at the start of day shift this morning. Patient had taken the BiPap off to eat breakfast, but afterwards patient was laying flat in the bed and patient's spO2 ended up dropping down to the low 60's%. Charge Nurse had put patient on HFNC 6 liters of oxygen for patient to recover. Prior to placing patient back on BiPap, RN was able to wean patient's oxygen back down to 2 liters.

## 2024-02-04 NOTE — Progress Notes (Signed)
" °   02/04/24 2337  BiPAP/CPAP/SIPAP  BiPAP/CPAP/SIPAP Pt Type Adult  BiPAP/CPAP/SIPAP SERVO (AIR)  Mask Type Full face mask  Dentures removed? Not applicable  Set Rate 12 breaths/min  Respiratory Rate 22 breaths/min  IPAP 20 cmH20  EPAP 6 cmH2O  Pressure Support 14 cmH20  PEEP 6 cmH20  FiO2 (%) 70 % (INCREASED DUE TO o2 BEING IN THE 70'S AND 80'S)  Minute Ventilation 5.7  Leak 57  Peak Inspiratory Pressure (PIP) 17  Tidal Volume (Vt) 336  Patient Home Machine No  Patient Home Mask No  Patient Home Tubing No  Auto Titrate No  Press High Alarm 30 cmH2O  Press Low Alarm 5 cmH2O  Device Plugged into RED Power Outlet Yes    "

## 2024-02-04 NOTE — Plan of Care (Signed)
°  Problem: Education: °Goal: Ability to demonstrate management of disease process will improve °Outcome: Progressing °Goal: Ability to verbalize understanding of medication therapies will improve °Outcome: Progressing °Goal: Individualized Educational Video(s) °Outcome: Progressing °  °

## 2024-02-05 DIAGNOSIS — Z6841 Body Mass Index (BMI) 40.0 and over, adult: Secondary | ICD-10-CM | POA: Diagnosis not present

## 2024-02-05 DIAGNOSIS — T148XXA Other injury of unspecified body region, initial encounter: Secondary | ICD-10-CM | POA: Diagnosis not present

## 2024-02-05 DIAGNOSIS — W19XXXA Unspecified fall, initial encounter: Secondary | ICD-10-CM

## 2024-02-05 DIAGNOSIS — J9602 Acute respiratory failure with hypercapnia: Secondary | ICD-10-CM | POA: Diagnosis not present

## 2024-02-05 DIAGNOSIS — I5023 Acute on chronic systolic (congestive) heart failure: Secondary | ICD-10-CM | POA: Diagnosis not present

## 2024-02-05 DIAGNOSIS — I10 Essential (primary) hypertension: Secondary | ICD-10-CM | POA: Diagnosis not present

## 2024-02-05 DIAGNOSIS — J9621 Acute and chronic respiratory failure with hypoxia: Secondary | ICD-10-CM

## 2024-02-05 DIAGNOSIS — I4891 Unspecified atrial fibrillation: Secondary | ICD-10-CM

## 2024-02-05 DIAGNOSIS — J9622 Acute and chronic respiratory failure with hypercapnia: Secondary | ICD-10-CM

## 2024-02-05 DIAGNOSIS — J9601 Acute respiratory failure with hypoxia: Secondary | ICD-10-CM | POA: Diagnosis not present

## 2024-02-05 LAB — CBC
HCT: 31.4 % — ABNORMAL LOW (ref 39.0–52.0)
Hemoglobin: 8.9 g/dL — ABNORMAL LOW (ref 13.0–17.0)
MCH: 30.4 pg (ref 26.0–34.0)
MCHC: 28.3 g/dL — ABNORMAL LOW (ref 30.0–36.0)
MCV: 107.2 fL — ABNORMAL HIGH (ref 80.0–100.0)
Platelets: 180 K/uL (ref 150–400)
RBC: 2.93 MIL/uL — ABNORMAL LOW (ref 4.22–5.81)
RDW: 18.3 % — ABNORMAL HIGH (ref 11.5–15.5)
WBC: 5.7 K/uL (ref 4.0–10.5)
nRBC: 3.7 % — ABNORMAL HIGH (ref 0.0–0.2)

## 2024-02-05 LAB — POCT I-STAT 7, (LYTES, BLD GAS, ICA,H+H)
Acid-Base Excess: 13 mmol/L — ABNORMAL HIGH (ref 0.0–2.0)
Bicarbonate: 36.6 mmol/L — ABNORMAL HIGH (ref 20.0–28.0)
Calcium, Ion: 1.28 mmol/L (ref 1.15–1.40)
HCT: 24 % — ABNORMAL LOW (ref 39.0–52.0)
Hemoglobin: 8.2 g/dL — ABNORMAL LOW (ref 13.0–17.0)
O2 Saturation: 99 %
Patient temperature: 98.6
Potassium: 5.2 mmol/L — ABNORMAL HIGH (ref 3.5–5.1)
Sodium: 136 mmol/L (ref 135–145)
TCO2: 38 mmol/L — ABNORMAL HIGH (ref 22–32)
pCO2 arterial: 44.2 mmHg (ref 32–48)
pH, Arterial: 7.526 — ABNORMAL HIGH (ref 7.35–7.45)
pO2, Arterial: 128 mmHg — ABNORMAL HIGH (ref 83–108)

## 2024-02-05 LAB — BASIC METABOLIC PANEL WITH GFR
Anion gap: 5 (ref 5–15)
BUN: 28 mg/dL — ABNORMAL HIGH (ref 8–23)
CO2: 37 mmol/L — ABNORMAL HIGH (ref 22–32)
Calcium: 10 mg/dL (ref 8.9–10.3)
Chloride: 99 mmol/L (ref 98–111)
Creatinine, Ser: 1.02 mg/dL (ref 0.61–1.24)
GFR, Estimated: >60 mL/min
Glucose, Bld: 122 mg/dL — ABNORMAL HIGH (ref 70–99)
Potassium: 5.3 mmol/L — ABNORMAL HIGH (ref 3.5–5.1)
Sodium: 141 mmol/L (ref 135–145)

## 2024-02-05 LAB — BLOOD GAS, VENOUS
Acid-Base Excess: 8.4 mmol/L — ABNORMAL HIGH (ref 0.0–2.0)
Bicarbonate: 42.4 mmol/L — ABNORMAL HIGH (ref 20.0–28.0)
O2 Saturation: 99.2 %
Patient temperature: 37
pCO2, Ven: 106 mmHg (ref 44–60)
pH, Ven: 7.21 — ABNORMAL LOW (ref 7.25–7.43)
pO2, Ven: 119 mmHg — ABNORMAL HIGH (ref 32–45)

## 2024-02-05 LAB — MAGNESIUM: Magnesium: 2.7 mg/dL — ABNORMAL HIGH (ref 1.7–2.4)

## 2024-02-05 LAB — MRSA NEXT GEN BY PCR, NASAL: MRSA by PCR Next Gen: NOT DETECTED

## 2024-02-05 MED ORDER — FUROSEMIDE 10 MG/ML IJ SOLN
40.0000 mg | Freq: Once | INTRAMUSCULAR | Status: AC
  Administered 2024-02-05: 40 mg via INTRAVENOUS
  Filled 2024-02-05: qty 4

## 2024-02-05 MED ORDER — ORAL CARE MOUTH RINSE
15.0000 mL | OROMUCOSAL | Status: DC
  Administered 2024-02-05 – 2024-02-06 (×3): 15 mL via OROMUCOSAL

## 2024-02-05 MED ORDER — ARFORMOTEROL TARTRATE 15 MCG/2ML IN NEBU
15.0000 ug | INHALATION_SOLUTION | Freq: Two times a day (BID) | RESPIRATORY_TRACT | Status: DC
  Administered 2024-02-05 – 2024-02-24 (×38): 15 ug via RESPIRATORY_TRACT
  Filled 2024-02-05 (×24): qty 2

## 2024-02-05 MED ORDER — ORAL CARE MOUTH RINSE: 15.0000 mL | OROMUCOSAL | Status: DC | PRN

## 2024-02-05 MED ORDER — CHLORHEXIDINE GLUCONATE CLOTH 2 % EX PADS
6.0000 | MEDICATED_PAD | Freq: Every day | CUTANEOUS | Status: DC
  Administered 2024-02-05 – 2024-02-09 (×5): 6 via TOPICAL

## 2024-02-05 MED ORDER — REVEFENACIN 175 MCG/3ML IN SOLN
175.0000 ug | Freq: Every day | RESPIRATORY_TRACT | Status: DC
  Administered 2024-02-05 – 2024-02-24 (×20): 175 ug via RESPIRATORY_TRACT
  Filled 2024-02-05 (×13): qty 3

## 2024-02-05 NOTE — Plan of Care (Signed)
 Reassessed patient this afternoon.  ABG 7.52/40/128/36 suggesting alkalosis.  Patient is more awake.  He is oriented x 4.  Family friend at bedside.  Can wean off BiPAP to minimal oxygen to keep saturation above 88%.  PCCM on board.
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
## 2024-02-05 NOTE — Progress Notes (Addendum)
 RT walked into patient room to find RN giving pt water  via straw through the mask. RT told RN to stop. Pt continues screaming that he wants water  and is not listening to explanation. Settings decreased to 15/5 to start weaning off of bipap.

## 2024-02-05 NOTE — Progress Notes (Signed)
 PT Cancellation Note  Patient Details Name: [name] MRN: [id number] DOB: [birth year]   Cancelled Treatment:    Reason Eval/Treat Not Completed: (P) Medical issues which prohibited therapy (Pt on continuous BIPAP transferring to step down, RN deferred tx at this time. Will continue to follow per POC pending medical presentation.)   [name] [name] 02/05/2024, 1:42 PM  Jacques Fife R. , PTA Acute Rehabilitation Services Office [phone]

## 2024-02-06 DIAGNOSIS — I5023 Acute on chronic systolic (congestive) heart failure: Secondary | ICD-10-CM | POA: Diagnosis not present

## 2024-02-06 DIAGNOSIS — T148XXA Other injury of unspecified body region, initial encounter: Secondary | ICD-10-CM | POA: Diagnosis not present

## 2024-02-06 DIAGNOSIS — D539 Nutritional anemia, unspecified: Secondary | ICD-10-CM

## 2024-02-06 DIAGNOSIS — R451 Restlessness and agitation: Secondary | ICD-10-CM

## 2024-02-06 DIAGNOSIS — I10 Essential (primary) hypertension: Secondary | ICD-10-CM | POA: Diagnosis not present

## 2024-02-06 DIAGNOSIS — J9601 Acute respiratory failure with hypoxia: Secondary | ICD-10-CM | POA: Diagnosis not present

## 2024-02-06 DIAGNOSIS — Z6841 Body Mass Index (BMI) 40.0 and over, adult: Secondary | ICD-10-CM | POA: Diagnosis not present

## 2024-02-06 DIAGNOSIS — J9602 Acute respiratory failure with hypercapnia: Secondary | ICD-10-CM | POA: Diagnosis not present

## 2024-02-06 LAB — RENAL FUNCTION PANEL
Albumin: 3.2 g/dL — ABNORMAL LOW (ref 3.5–5.0)
Anion gap: 7 (ref 5–15)
BUN: 30 mg/dL — ABNORMAL HIGH (ref 8–23)
CO2: 37 mmol/L — ABNORMAL HIGH (ref 22–32)
Calcium: 10 mg/dL (ref 8.9–10.3)
Chloride: 100 mmol/L (ref 98–111)
Creatinine, Ser: 1.03 mg/dL (ref 0.61–1.24)
GFR, Estimated: >60 mL/min
Glucose, Bld: 98 mg/dL (ref 70–99)
Phosphorus: 1.5 mg/dL — ABNORMAL LOW (ref 2.5–4.6)
Potassium: 4.6 mmol/L (ref 3.5–5.1)
Sodium: 144 mmol/L (ref 135–145)

## 2024-02-06 LAB — RESPIRATORY PANEL BY PCR

## 2024-02-06 LAB — CBC
HCT: 26.3 % — ABNORMAL LOW (ref 39.0–52.0)
Hemoglobin: 7.7 g/dL — ABNORMAL LOW (ref 13.0–17.0)
MCH: 30.8 pg (ref 26.0–34.0)
MCHC: 29.3 g/dL — ABNORMAL LOW (ref 30.0–36.0)
MCV: 105.2 fL — ABNORMAL HIGH (ref 80.0–100.0)
Platelets: 163 K/uL (ref 150–400)
RBC: 2.5 MIL/uL — ABNORMAL LOW (ref 4.22–5.81)
RDW: 18.3 % — ABNORMAL HIGH (ref 11.5–15.5)
WBC: 4 K/uL (ref 4.0–10.5)
nRBC: 4.7 % — ABNORMAL HIGH (ref 0.0–0.2)

## 2024-02-06 LAB — BLOOD GAS, ARTERIAL
Acid-Base Excess: 21.2 mmol/L — ABNORMAL HIGH (ref 0.0–2.0)
Bicarbonate: 48.3 mmol/L — ABNORMAL HIGH (ref 20.0–28.0)
Expiratory PAP: 6 cmH2O
FIO2: 40 %
Inspiratory PAP: 20 cmH2O
O2 Saturation: 98.5 %
Patient temperature: 37
pCO2 arterial: 62 mmHg — ABNORMAL HIGH (ref 32–48)
pH, Arterial: 7.5 — ABNORMAL HIGH (ref 7.35–7.45)
pO2, Arterial: 83 mmHg (ref 83–108)

## 2024-02-06 LAB — FOLATE: Folate: 19.5 ng/mL

## 2024-02-06 LAB — MAGNESIUM: Magnesium: 2.3 mg/dL (ref 1.7–2.4)

## 2024-02-06 LAB — PROCALCITONIN: Procalcitonin: <0.1 ng/mL

## 2024-02-06 MED ORDER — FUROSEMIDE 10 MG/ML IJ SOLN
40.0000 mg | Freq: Once | INTRAMUSCULAR | Status: AC
  Administered 2024-02-06: 40 mg via INTRAVENOUS
  Filled 2024-02-06: qty 4

## 2024-02-06 MED ORDER — SODIUM PHOSPHATES 45 MMOLE/15ML IV SOLN
30.0000 mmol | Freq: Once | INTRAVENOUS | Status: AC
  Administered 2024-02-06: 30 mmol via INTRAVENOUS
  Filled 2024-02-06: qty 10

## 2024-02-06 MED ORDER — ORAL CARE MOUTH RINSE
15.0000 mL | OROMUCOSAL | Status: DC
  Administered 2024-02-07: 15 mL via OROMUCOSAL

## 2024-02-06 MED ORDER — DEXMEDETOMIDINE HCL IN NACL 400 MCG/100ML IV SOLN
0.0000 ug/kg/h | INTRAVENOUS | Status: DC
  Administered 2024-02-06: 0.8 ug/kg/h via INTRAVENOUS
  Filled 2024-02-06: qty 100

## 2024-02-06 MED ORDER — ORAL CARE MOUTH RINSE: 15.0000 mL | OROMUCOSAL | Status: DC | PRN

## 2024-02-06 MED ORDER — DEXMEDETOMIDINE HCL IN NACL 200 MCG/50ML IV SOLN
0.0000 ug/kg/h | INTRAVENOUS | Status: DC
  Administered 2024-02-06: 0.8 ug/kg/h via INTRAVENOUS
  Administered 2024-02-06: 0.7 ug/kg/h via INTRAVENOUS
  Administered 2024-02-06 (×2): 0.8 ug/kg/h via INTRAVENOUS
  Filled 2024-02-06 (×4): qty 50

## 2024-02-06 MED ORDER — ACETAZOLAMIDE SODIUM 500 MG IJ SOLR
500.0000 mg | Freq: Once | INTRAMUSCULAR | Status: AC
  Administered 2024-02-06: 500 mg via INTRAVENOUS
  Filled 2024-02-06: qty 500

## 2024-02-06 NOTE — Progress Notes (Signed)
 Physical Therapy Treatment Patient Details Name: [name] MRN: [id number] DOB: [birth year] Today's Date: 02/06/2024   History of Present Illness Patient is 73y.o. male admitted from home after a fall from his wheelchair and was down for ~8 hours. Reported some leg swelling and SOB. CT chest, abdomen and pelvis showed intramuscular hematoma in the anterior compartment of R thigh. Other significant PMH includes morbid obesity, L AKA, A-fib, hypothyroidism, GERD, headaches, CHF.    PT Comments  The patient  resting in bed Spo2 95% on 3 L. Patient  assisted with rolling to each side x 3, repositioning in bed and placed in semi bed chair position. Patient is very limited in  mobility being in the hospital bed with significant challenges to sit up. Patient will require a mechanical  sky lift  roomwhen transferred out of SDU.  Patient will benefit from continued inpatient follow up therapy, <3 hours/day    If plan is discharge home, recommend the following: Assist for transportation;Two people to help with walking and/or transfers;Two people to help with bathing/dressing/bathroom;Help with stairs or ramp for entrance   Can travel by private vehicle     No  Equipment Recommendations  None recommended by PT    Recommendations for Other Services       Precautions / Restrictions Precautions Precautions: Fall Recall of Precautions/Restrictions: Intact Restrictions Weight Bearing Restrictions Per Provider Order: No     Mobility  Bed Mobility Overal bed mobility: Needs Assistance Bed Mobility: Rolling Rolling: Max assist, +2 for physical assistance, +2 for safety/equipment, Used rails         General bed mobility comments: total  to slide up in bed and to roll to each side x 2, , attempted  to pull forward in bed with  bed tilted downward. Patient moved trunk about  5  forward and +2 max assist with bed pad  behind.    Transfers                   General transfer  comment: will require maximove (patient ~> 450 lbs)    Ambulation/Gait                   Stairs             Wheelchair Mobility     Tilt Bed    Modified Rankin (Stroke Patients Only)       Balance Overall balance assessment: Needs assistance, History of Falls   Sitting balance-Leahy Scale: Poor Sitting balance - Comments: significant effort to trial due to body habitus                                    Communication Communication Communication: Impaired Factors Affecting Communication: Hearing impaired  Cognition Arousal: Alert Behavior During Therapy: WFL for tasks assessed/performed   PT - Cognitive impairments: Difficult to assess, Memory                       PT - Cognition Comments: having some difficulty recalling how he transferred from bed to W[place] Following commands: Impaired      Cueing Cueing Techniques: Verbal cues, Tactile cues  Exercises      General Comments        Pertinent Vitals/Pain Pain Assessment Faces Pain Scale: Hurts even more Pain Location: LUE Pain Descriptors / Indicators: Cramping, Grimacing Pain Intervention(s): Monitored during session, Limited activity  within patient's tolerance    Home Living                          Prior Function            PT Goals (current goals can now be found in the care plan section) Progress towards PT goals: Progressing toward goals    Frequency    Min 2X/week      PT Plan      Co-evaluation              AM-PAC PT 6 Clicks Mobility   Outcome Measure  Help needed turning from your back to your side while in a flat bed without using bedrails?: Total Help needed moving from lying on your back to sitting on the side of a flat bed without using bedrails?: Total Help needed moving to and from a bed to a chair (including a wheelchair)?: Total Help needed standing up from a chair using your arms (e.g., wheelchair or bedside chair)?:  Total Help needed to walk in hospital room?: Total Help needed climbing 3-5 steps with a railing? : Total 6 Click Score: 6    End of Session Equipment Utilized During Treatment: Oxygen Activity Tolerance: Patient limited by pain;Other (comment) Patient left: in bed;with call bell/phone within reach Nurse Communication: Need for lift equipment;Other (comment) PT Visit Diagnosis: Muscle weakness (generalized) (M62.81);History of falling (Z91.81);Other abnormalities of gait and mobility (R26.89)     Time: 8843-8782 PT Time Calculation (min) (ACUTE ONLY): 21 min  Charges:    $Therapeutic Activity: 8-22 mins PT General Charges $$ ACUTE PT VISIT: 1 Visit                    Darice Potters PT Acute Rehabilitation Services Office [phone]    Potters Darice Norris 02/06/2024, 1:29 PM

## 2024-02-06 NOTE — Plan of Care (Signed)
  Problem: Activity: Goal: Risk for activity intolerance will decrease Outcome: Progressing   Problem: Nutrition: Goal: Adequate nutrition will be maintained Outcome: Progressing   Problem: Coping: Goal: Level of anxiety will decrease Outcome: Progressing   Problem: Pain Managment: Goal: General experience of comfort will improve and/or be controlled Outcome: Progressing

## 2024-02-06 NOTE — Progress Notes (Signed)
" °   02/06/24 2303  BiPAP/CPAP/SIPAP  BiPAP/CPAP/SIPAP Pt Type Adult  BiPAP/CPAP/SIPAP SERVO (air)  Mask Type Full face mask  Dentures removed? Not applicable  Mask Size Small  Set Rate 16 breaths/min  Respiratory Rate 18 breaths/min  IPAP 20 cmH20  EPAP 6 cmH2O  Pressure Support 14 cmH20  PEEP 6 cmH20  FiO2 (%) 40 %  Minute Ventilation 10.2  Leak 62  Peak Inspiratory Pressure (PIP) 21  Tidal Volume (Vt) 657  Patient Home Machine No  Patient Home Mask No  Patient Home Tubing No  Auto Titrate No  Press High Alarm 30 cmH2O  Device Plugged into RED Power Outlet Yes    "

## 2024-02-06 NOTE — Progress Notes (Signed)
 RN removed PT from BiPAP post ABG results at approximately 1148.
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
## 2024-02-06 NOTE — Progress Notes (Signed)
 RT attempted x2 per policy for ABG. Charge RT aware and will attempt to obtain when able.

## 2024-02-06 NOTE — Plan of Care (Signed)
" °  Problem: Elimination: Goal: Will not experience complications related to urinary retention Outcome: Progressing   Problem: Education: Goal: Ability to demonstrate management of disease process will improve Outcome: Not Progressing Goal: Ability to verbalize understanding of medication therapies will improve Outcome: Not Progressing   Problem: Education: Goal: Knowledge of General Education information will improve Description: Including pain rating scale, medication(s)/side effects and non-pharmacologic comfort measures Outcome: Not Progressing   Problem: Clinical Measurements: Goal: Respiratory complications will improve Outcome: Not Progressing Goal: Cardiovascular complication will be avoided Outcome: Not Progressing   Problem: Nutrition: Goal: Adequate nutrition will be maintained Outcome: Not Progressing   Problem: Coping: Goal: Level of anxiety will decrease Outcome: Not Progressing   Problem: Pain Managment: Goal: General experience of comfort will improve and/or be controlled Outcome: Not Progressing   "
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
## 2024-02-07 DIAGNOSIS — T148XXA Other injury of unspecified body region, initial encounter: Secondary | ICD-10-CM | POA: Diagnosis not present

## 2024-02-07 DIAGNOSIS — J9601 Acute respiratory failure with hypoxia: Secondary | ICD-10-CM | POA: Diagnosis not present

## 2024-02-07 DIAGNOSIS — I10 Essential (primary) hypertension: Secondary | ICD-10-CM | POA: Diagnosis not present

## 2024-02-07 DIAGNOSIS — I5023 Acute on chronic systolic (congestive) heart failure: Secondary | ICD-10-CM | POA: Diagnosis not present

## 2024-02-07 LAB — CBC
HCT: 26.8 % — ABNORMAL LOW (ref 39.0–52.0)
Hemoglobin: 8 g/dL — ABNORMAL LOW (ref 13.0–17.0)
MCH: 30.7 pg (ref 26.0–34.0)
MCHC: 29.9 g/dL — ABNORMAL LOW (ref 30.0–36.0)
MCV: 102.7 fL — ABNORMAL HIGH (ref 80.0–100.0)
Platelets: 144 K/uL — ABNORMAL LOW (ref 150–400)
RBC: 2.61 MIL/uL — ABNORMAL LOW (ref 4.22–5.81)
RDW: 18.8 % — ABNORMAL HIGH (ref 11.5–15.5)
WBC: 3.4 K/uL — ABNORMAL LOW (ref 4.0–10.5)
nRBC: 0.9 % — ABNORMAL HIGH (ref 0.0–0.2)

## 2024-02-07 LAB — RENAL FUNCTION PANEL
Albumin: 3 g/dL — ABNORMAL LOW (ref 3.5–5.0)
Anion gap: 8 (ref 5–15)
BUN: 30 mg/dL — ABNORMAL HIGH (ref 8–23)
CO2: 33 mmol/L — ABNORMAL HIGH (ref 22–32)
Calcium: 9.5 mg/dL (ref 8.9–10.3)
Chloride: 98 mmol/L (ref 98–111)
Creatinine, Ser: 0.93 mg/dL (ref 0.61–1.24)
GFR, Estimated: >60 mL/min
Glucose, Bld: 86 mg/dL (ref 70–99)
Phosphorus: 2.4 mg/dL — ABNORMAL LOW (ref 2.5–4.6)
Potassium: 4 mmol/L (ref 3.5–5.1)
Sodium: 139 mmol/L (ref 135–145)

## 2024-02-07 LAB — MAGNESIUM: Magnesium: 2.4 mg/dL (ref 1.7–2.4)

## 2024-02-07 MED ORDER — SODIUM PHOSPHATES 45 MMOLE/15ML IV SOLN
30.0000 mmol | Freq: Once | INTRAVENOUS | Status: AC
  Administered 2024-02-07: 30 mmol via INTRAVENOUS
  Filled 2024-02-07: qty 10

## 2024-02-07 MED ORDER — ORAL CARE MOUTH RINSE: 15.0000 mL | OROMUCOSAL | Status: DC | PRN

## 2024-02-07 MED ORDER — TORSEMIDE 20 MG PO TABS
40.0000 mg | ORAL_TABLET | Freq: Every day | ORAL | Status: DC
  Administered 2024-02-07 – 2024-02-23 (×17): 40 mg via ORAL
  Filled 2024-02-07 (×17): qty 2
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
## 2024-02-07 NOTE — Progress Notes (Signed)
" °   02/07/24 2103  BiPAP/CPAP/SIPAP  BiPAP/CPAP/SIPAP Pt Type Adult  BiPAP/CPAP/SIPAP SERVO  Mask Type Full face mask  Dentures removed? Not applicable  Mask Size Small  Set Rate 16 breaths/min  Respiratory Rate 18 breaths/min  IPAP 20 cmH20  EPAP 6 cmH2O  Pressure Support 14 cmH20 (pc)  PEEP 6 cmH20  FiO2 (%) 40 %  Minute Ventilation 11.1  Leak 34  Peak Inspiratory Pressure (PIP) 21  Tidal Volume (Vt) 671  Patient Home Machine No  Patient Home Mask No  Patient Home Tubing No  Auto Titrate No  Press High Alarm 30 cmH2O  CPAP/SIPAP surface wiped down Yes  Device Plugged into RED Power Outlet Yes  BiPAP/CPAP /SiPAP Vitals  Pulse Rate 89  Resp 18  SpO2 92 %  Bilateral Breath Sounds Diminished  MEWS Score/Color  MEWS Score 0  MEWS Score Color Green    "

## 2024-02-07 NOTE — Plan of Care (Signed)
  Problem: Clinical Measurements: Goal: Diagnostic test results will improve Outcome: Progressing Goal: Respiratory complications will improve Outcome: Progressing Goal: Cardiovascular complication will be avoided Outcome: Progressing   

## 2024-02-07 NOTE — TOC Progression Note (Signed)
 Transition of Care [facility]) - Progression Note    Patient Details  Name: [name] MRN: [id number] Date of Birth: [birth year]  [facility] of [facility]) CM/SW Contact  Jon ONEIDA Anon, RN Phone Number: 02/07/2024, 1:26 PM  Clinical Narrative:    PT/OT recommending SNF placement at discharge. Pt needing Bipap/NIV at discharge. Orders sent to [facility]. Mitch, DME rep stating pt needing additional lab study test performed in order for pt to qualify for needed DME. RNCM made Kara, MD aware. Stating the bedside spirometry test is only complete at [facility] hospital. [facility] discussed pt getting a replacement Cpap machine with rep Mitch and he states he will check to see when pt last had sleep study completed to see if he can qualify for Cpap.   RNCM sent out referrals for SNF placement, awaiting bed offers. Will present to pt as they become available. ICM continuing to follow for DC planning needs.     Expected Discharge Plan: Skilled Nursing Facility Barriers to Discharge: Continued Medical Work up               Expected Discharge Plan and Services       Living arrangements for the past 2 months: Apartment                                       Social Drivers of Health (SDOH) Interventions SDOH Screenings   Food Insecurity: Patient Unable To Answer (02/05/2024)  Housing: Low Risk (02/05/2024)  Transportation Needs: Patient Unable To Answer (02/05/2024)  Utilities: Patient Unable To Answer (02/05/2024)  Financial Resource Strain: Low Risk (04/13/2023)   Received from [facility]  Physical Activity: Unknown (04/13/2023)   Received from [facility]  Social Connections: Patient Unable To Answer (02/05/2024)  Stress: Stress Concern Present (04/13/2023)   Received from [facility]  Tobacco Use: Low Risk (01/31/2024)    Readmission Risk Interventions     No data to display

## 2024-02-07 NOTE — NC FL2 (Signed)
 " [place]  MEDICAID FL2 LEVEL OF CARE FORM     IDENTIFICATION  Patient Name: [name] Birthdate: [birth year] Sex: male Admission Date (Current Location): 01/31/2024  [facility] and [place] Number:  Producer, Television/film/video and Address:  [facility],  [address], [place] [address]      Provider Number: [id number]  Attending Physician Name and Address:  Kathrin Mignon DASEN, MD  Relative Name and Phone Number:  Dene Dorthea Ahumada, Emergency Contact  [phone]    Current Level of Care: Hospital Recommended Level of Care: Skilled Nursing Facility Prior Approval Number:    Date Approved/Denied:   PASRR Number: [id number]  Discharge Plan: SNF    Current Diagnoses: Patient Active Problem List   Diagnosis Date Noted   Intramuscular hematoma 01/31/2024   Essential hypertension 01/31/2024   Acute on chronic systolic CHF (congestive heart failure) (HCC) 01/31/2024   Fall 01/31/2024   S/P AKA (above knee amputation) unilateral, left (HCC) 04/02/2022   Dehiscence of amputation stump of left lower extremity (HCC) 04/02/2022   Unilateral AKA, left (HCC) 09/15/2021   Hypokalemia 09/14/2021   Left leg cellulitis 09/14/2021   Chronic systolic CHF (congestive heart failure) (HCC) 09/14/2021   Fall at home, initial encounter 09/14/2021   Acute on chronic combined systolic and diastolic CHF (congestive heart failure) (HCC) 04/17/2021   Physical deconditioning 04/17/2021   Cellulitis of groin, right 04/14/2021   Pressure injury of skin 04/14/2021   Acute respiratory failure with hypoxia and hypercapnia (HCC) 01/19/2021   Hypothyroidism    Macrocytic anemia    Morbid obesity with BMI of 60.0-69.9, adult (HCC)    Pneumonia due to COVID-19 virus 01/15/2021   Cellulitis of left leg    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)    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   Septic joint of left knee joint (HCC) 11/06/2019   Infection of total knee  replacement 08/15/2019   Open knee wound 08/14/2019   S/P left TK revision 04/03/2019   Administration of long-term prophylactic antibiotics    History of streptococcal infection    History of DVT (deep vein thrombosis)    S/P rev left TK 12/15/2015   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   Preoperative cardiovascular examination 04/17/2013   OSA (obstructive sleep apnea)-3 L of oxygen at night and not CPAP 11/27/2012   Chronic anticoagulation, with Xarelto  09/10/2012   DVT (deep venous thrombosis), possible 09/10/2012   SOB (shortness of breath) 08/26/2012   Chest discomfort 08/26/2012   Persistent atrial fibrillation (HCC) 08/26/2012   Morbid obesity with BMI of 70 and over, adult (HCC) 06/27/2012   Expected blood loss anemia 06/27/2012   S/P left TK revision 06/23/2012   Septic arthritis of knee (HCC)    Cellulitis 09/19/2010   Methicillin resistant Staphylococcus aureus infection 09/10/2009   STREPTOCOCCUS INFECTION CCE & UNS SITE GROUP C 07/04/2008   CHRONIC KIDNEY DISEASE UNSPECIFIED 07/04/2008   INFECTION DUE TO INTERNAL ORTH DEVICE NEC 03/02/2006    Orientation RESPIRATION BLADDER Height & Weight     Self, Time, Situation, Place  O2, Other (Comment) (3L O2 [place] (BIPAP)) Incontinent Weight: (!) 209.5 kg Height:  5' 8 (172.7 cm)  BEHAVIORAL SYMPTOMS/MOOD NEUROLOGICAL BOWEL NUTRITION STATUS      Continent Diet (Heart Healthy, Thin Liquids)  AMBULATORY STATUS COMMUNICATION OF NEEDS Skin   Extensive Assist Verbally Other (Comment) (Pretibial Right)  Personal Care Assistance Level of Assistance  Bathing, Feeding, Dressing Bathing Assistance: Limited assistance Feeding assistance: Limited assistance Dressing Assistance: Limited assistance     Functional Limitations Info  Sight, Hearing, Speech Sight Info: Adequate Hearing Info: Impaired Speech Info: Adequate    SPECIAL CARE FACTORS FREQUENCY  PT (By licensed PT), OT (By licensed OT)     PT  Frequency: 5x/wk OT Frequency: 5x/wk            Contractures Contractures Info: Not present    Additional Factors Info  Code Status, Allergies Code Status Info: FULL Allergies Info: Morphine           Current Medications (02/07/2024):  This is the current hospital active medication list Current Facility-Administered Medications  Medication Dose Route Frequency Provider Last Rate Last Admin   acetaminophen  (TYLENOL ) tablet 650 mg  650 mg Oral Q6H PRN Gonfa, Taye T, MD   650 mg at 02/06/24 2144   Or   acetaminophen  (TYLENOL ) suppository 650 mg  650 mg Rectal Q6H PRN Gonfa, Taye T, MD       apixaban  (ELIQUIS ) tablet 5 mg  5 mg Oral BID Gonfa, Taye T, MD   5 mg at 02/07/24 9153   arformoterol  (BROVANA ) nebulizer solution 15 mcg  15 mcg Nebulization BID Dewald, Jonathan B, MD   15 mcg at 02/07/24 0901   Chlorhexidine  Gluconate Cloth 2 % PADS 6 each  6 each Topical Daily Gonfa, Taye T, MD   6 each at 02/06/24 1740   feeding supplement (ENSURE PLUS HIGH PROTEIN) liquid 237 mL  237 mL Oral BID BM Gonfa, Taye T, MD   237 mL at 02/07/24 0846   hydrocerin (EUCERIN) cream   Topical BID Gonfa, Taye T, MD   Given at 02/07/24 0853   levothyroxine  (SYNTHROID ) tablet 125 mcg  125 mcg Oral Q0600 Gonfa, Taye T, MD   125 mcg at 02/07/24 9382   metoprolol  succinate (TOPROL -XL) 24 hr tablet 75 mg  75 mg Oral Daily Gonfa, Taye T, MD   75 mg at 02/07/24 0845   ondansetron  (ZOFRAN ) tablet 4 mg  4 mg Oral Q6H PRN Gonfa, Taye T, MD   4 mg at 02/04/24 2126   Or   ondansetron  (ZOFRAN ) injection 4 mg  4 mg Intravenous Q6H PRN Gonfa, Taye T, MD       Oral care mouth rinse  15 mL Mouth Rinse PRN Gonfa, Taye T, MD       polyethylene glycol (MIRALAX  / GLYCOLAX ) packet 17 g  17 g Oral BID PRN Gonfa, Taye T, MD   17 g at 02/04/24 0853   revefenacin  (YUPELRI ) nebulizer solution 175 mcg  175 mcg Nebulization Daily Dewald, Jonathan B, MD   175 mcg at 02/07/24 0901   senna-docusate (Senokot-S) tablet 2 tablet  2  tablet Oral BID PRN Gonfa, Taye T, MD   2 tablet at 02/04/24 0851   sodium phosphate  (FLEET) enema 1 enema  1 enema Rectal Daily PRN Gonfa, Taye T, MD   1 enema at 02/03/24 1807   sodium phosphate  30 mmol in sodium chloride  0.9 % 250 mL infusion  30 mmol Intravenous Once Dewald, Jonathan B, MD 43 mL/hr at 02/07/24 1000 Infusion Verify at 02/07/24 1000   torsemide  (DEMADEX ) tablet 40 mg  40 mg Oral Daily Gonfa, Taye T, MD   40 mg at 02/07/24 0845     Discharge Medications: Please see discharge summary for a list of discharge medications.  Relevant Imaging Results:  Relevant Lab Results:   Additional Information SSN: [id number] Ht: 5'8 Wt: 209.5 kg BMI: 70.23  Jon ONEIDA Anon, RN     "
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
## 2024-02-08 ENCOUNTER — Encounter (HOSPITAL_COMMUNITY): Payer: Self-pay | Admitting: Student

## 2024-02-08 DIAGNOSIS — Z7901 Long term (current) use of anticoagulants: Secondary | ICD-10-CM

## 2024-02-08 DIAGNOSIS — Z993 Dependence on wheelchair: Secondary | ICD-10-CM

## 2024-02-08 DIAGNOSIS — I872 Venous insufficiency (chronic) (peripheral): Secondary | ICD-10-CM | POA: Insufficient documentation

## 2024-02-08 LAB — MAGNESIUM: Magnesium: 2.5 mg/dL — ABNORMAL HIGH (ref 1.7–2.4)

## 2024-02-08 LAB — CBC
HCT: 29.1 % — ABNORMAL LOW (ref 39.0–52.0)
Hemoglobin: 8.5 g/dL — ABNORMAL LOW (ref 13.0–17.0)
MCH: 29.9 pg (ref 26.0–34.0)
MCHC: 29.2 g/dL — ABNORMAL LOW (ref 30.0–36.0)
MCV: 102.5 fL — ABNORMAL HIGH (ref 80.0–100.0)
Platelets: 159 K/uL (ref 150–400)
RBC: 2.84 MIL/uL — ABNORMAL LOW (ref 4.22–5.81)
RDW: 18.7 % — ABNORMAL HIGH (ref 11.5–15.5)
WBC: 3.3 K/uL — ABNORMAL LOW (ref 4.0–10.5)
nRBC: 0.6 % — ABNORMAL HIGH (ref 0.0–0.2)

## 2024-02-08 LAB — RENAL FUNCTION PANEL
Albumin: 3.2 g/dL — ABNORMAL LOW (ref 3.5–5.0)
Anion gap: 8 (ref 5–15)
BUN: 28 mg/dL — ABNORMAL HIGH (ref 8–23)
CO2: 34 mmol/L — ABNORMAL HIGH (ref 22–32)
Calcium: 9.7 mg/dL (ref 8.9–10.3)
Chloride: 100 mmol/L (ref 98–111)
Creatinine, Ser: 0.96 mg/dL (ref 0.61–1.24)
GFR, Estimated: >60 mL/min
Glucose, Bld: 137 mg/dL — ABNORMAL HIGH (ref 70–99)
Phosphorus: 3.1 mg/dL (ref 2.5–4.6)
Potassium: 3.6 mmol/L (ref 3.5–5.1)
Sodium: 143 mmol/L (ref 135–145)

## 2024-02-08 LAB — GLUCOSE, CAPILLARY: Glucose-Capillary: 111 mg/dL — ABNORMAL HIGH (ref 70–99)

## 2024-02-08 NOTE — Assessment & Plan Note (Addendum)
 Body mass index is 69.29 kg/m.

## 2024-02-08 NOTE — Progress Notes (Signed)
 " PROGRESS NOTE    [name]  FMW:[id number] DOB: 06-13-1950 DOA: 01/31/2024 PCP: Pura Lenis, MD  Subjective: Pt seen and examined. Pt understands he needs to go to SNF at discharge.   Hospital Course: CC: fall, SOB HPI: [name] is a 73 y.o. male with medical history significant for morbid obesity, A-fib on Eliquis , OSA, status post left AKA, hypothyroidism, chronic systolic heart failure and DVT who presented to the ED for evaluation after a fall. Patient reports he lives alone but has a friend that checks on him often. He was attempting to move from his wheelchair to the bed when he fell on the ground. He was unable to get up on his own and laid down for about 8 hours before his friend found him and called EMS. He endorses some recent increase in leg swelling, mild shortness of breath and a chronic back pain but denies any cough, chest pain, fever, chills, abdominal pain, nausea, vomiting or dysuria.   Per EMS, patient was found to have SpO2 of 90% on room air, placed on 3 L [place]   ED Course: Initial vitals show afebrile, RR 18-20, HR 80-100, SBP 110-130s, SpO2, initially 95% on 3 L [place], transitioned to BiPAP. Initial labs significant for proBNP 1218, pCO2 60, flat troponin, Hgb 9.2 otherwise normal renal function, LFTs, lactic acid and white count. EKG shows A-fib. CXR shows cardiomegaly with vascular congestion.  CT C/A/P shows intramuscular hematoma in the anterior compartment of the proximal left thigh. Pt received IV LR 1 L bolus, IV Lasix  40 mg x 1, and IV Rocephin . TRH was consulted for admission.   Significant Events: Admitted 01/31/2024 for acute on chronic respiratory failure with hypercapnic and hypoxia, fall at home, left thigh hematoma, acute on chronic systolic CHF. Placed on bipap at time of admission. Systemic anticoagulation on hold due to intramuscular hematoma 02-01-2024 seen by wound care for right leg venous insufficiency ulcers 02-01-2024 remains on  bipap and IV lasix  for respiratory failure and CHF.  02-02-2024 remains on IV lasix  and bipap. 02-03-2024 changed to po demadex . IV lasix  stopped. HgB stable. Eliquis  restarted. 02-04-2024 due to worsening hypercapnia. VBG pH 7.21, PCO2 of 106, pt transferred to stepdown ICU and PCCM consulted 02-06-2024 pt has hospital acquired delirium and needs IV precedex  for sedation. He remains on Bipap 02-07-2024 PCCM states pt will need home Bipap machine.   Admission Labs: Covid, flu, rsv Negative Na 143, K 4.7, CO2 of 27, Bun 25, scr 0.91, glu 112 T. Prot 6.9, alb 3.5, AST 32, ALT 22, alk phos 75, t. Bili 1.3 WBC 6.2, HgB 9.2, plt 211 VBG pH 7.24, PCO2 of 82  Admission Imaging Studies: CXR Cardiomegaly with vascular congestion.  CT head, c-spine  No acute intracranial abnormality. 2. Limited CT of the cervical spine without evidence of acute fracture or traumatic malalignment. 3. Severe cervical degenerative change CT chest/abd/pelvis  Intramuscular hematoma in the anterior compartment of the proximal left thigh. 2. No acute/traumatic intrathoracic, abdominal, or pelvic pathology. 3. Cholelithiasis. 4.  Aortic Atherosclerosis CT T and L spine No obvious acute/traumatic thoracic or lumbar spine pathology. 2. Osteopenia and degenerative changes.  Significant Labs: Vit B12 of 1386 Folate >20 Fe 66, TIBC 302, %sat of 22 Ferritin 140  Significant Imaging Studies:   Antibiotic Therapy: Anti-infectives (From admission, onward)    Start     Dose/Rate Route Frequency Ordered Stop   01/31/24 1700  cefTRIAXone  (ROCEPHIN ) 1 g in sodium chloride  0.9 % 100  mL IVPB  Status:  Discontinued        1 g 200 mL/hr over 30 Minutes Intravenous  Once 01/31/24 1656 01/31/24 1658   01/31/24 1700  cefTRIAXone  (ROCEPHIN ) 2 g in sodium chloride  0.9 % 100 mL IVPB        2 g 200 mL/hr over 30 Minutes Intravenous  Once 01/31/24 1658 01/31/24 1908       Procedures:   Consultants: Wound care PCCM     Assessment and Plan: * Acute respiratory failure with hypoxia and hypercapnia (HCC) 02/08/2024 remains on bipap at night. 3 L/min O2.   Fall 02/08/2024 fell out of wheelchair and onto floor. Probably where his left thigh hematoma came from.   Acute on chronic systolic CHF (congestive heart failure) (HCC) 02/08/2024 back on Toprol -XL @ 75 mg daily. On oral demadex  40 mg daily. Echo shows LVEF 50% but had very poor echo windows.   Wheelchair dependent 02/08/2024 pt has not stood up on his right leg in several months. He is wheelchair dependent. He understands he needs to go to SNF at discharge.   Essential hypertension 02/08/2024 on Toprol -XL and demadex    Intramuscular hematoma 02/08/2024 due to fall out of his wheelchair at time of admission. Back on Xarelto .   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   Acquired hypothyroidism 02/08/2024 on Synthroid  125 mcg daily.   Chronic anticoagulation - hx of PAF 02/08/2024 on Eliquis  5 mg bid.   Morbid obesity with BMI of 70 and over, adult (HCC) Body mass index is 69.29 kg/m.    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  Reason for continuing need for hospitalization: remains on Bipap at night. Can transfer out of ICU to progressive care floor. Awaiting SNF decision.  Objective: Vitals:   02/08/24 0909 02/08/24 1000 02/08/24 1200 02/08/24 1349  BP:  114/68  118/77  Pulse:  79 85 82  Resp:  (!) 21 20 20   Temp:    98.9 F (37.2 C)  TempSrc:    Oral  SpO2: 96% 90% 93% 95%  Weight:      Height:        Intake/Output Summary (Last 24 hours) at 02/08/2024 1449 Last data filed at 02/08/2024 9360 Gross per 24 hour  Intake 75.48 ml  Output 2350 ml  Net -2274.52 ml   Filed Weights   02/06/24 0500  02/07/24 0414 02/08/24 0500  Weight: (!) 212.9 kg (!) 209.5 kg (!) 206.7 kg    Examination:  Physical Exam Vitals and nursing note reviewed.  Constitutional:      Appearance: He is obese.  HENT:     Head: Normocephalic and atraumatic.  Eyes:     General: No scleral icterus. Cardiovascular:     Rate and Rhythm: Normal rate and regular rhythm.  Pulmonary:     Effort: Pulmonary effort is normal.     Comments: Poor auscultation due to morbid obesity Abdominal:     General: Bowel sounds are normal.     Palpations: Abdomen is soft.  Musculoskeletal:     Comments: Left AKA  Skin:    Comments: Bandage on right lower leg.  Neurological:     Mental Status: He is alert and oriented to person, place, and time.     Data  Reviewed: I have personally reviewed following labs and imaging studies  CBC: Recent Labs  Lab 02/04/24 0402 02/05/24 0929 02/05/24 1427 02/06/24 0237 02/07/24 0332 02/08/24 0332  WBC 5.1 5.7  --  4.0 3.4* 3.3*  HGB 8.2* 8.9* 8.2* 7.7* 8.0* 8.5*  HCT 27.9* 31.4* 24.0* 26.3* 26.8* 29.1*  MCV 104.5* 107.2*  --  105.2* 102.7* 102.5*  PLT 173 180  --  163 144* 159   Basic Metabolic Panel: Recent Labs  Lab 02/04/24 0402 02/05/24 0929 02/05/24 1427 02/06/24 0237 02/07/24 0332 02/08/24 0332  NA 139 141 136 144 139 143  K 4.3 5.3* 5.2* 4.6 4.0 3.6  CL 100 99  --  100 98 100  CO2 31 37*  --  37* 33* 34*  GLUCOSE 111* 122*  --  98 86 137*  BUN 23 28*  --  30* 30* 28*  CREATININE 0.94 1.02  --  1.03 0.93 0.96  CALCIUM  9.7 10.0  --  10.0 9.5 9.7  MG 2.5* 2.7*  --  2.3 2.4 2.5*  PHOS 3.2  --   --  1.5* 2.4* 3.1   GFR: Estimated Creatinine Clearance: 121.7 mL/min (by C-G formula based on SCr of 0.96 mg/dL). Liver Function Tests: Recent Labs  Lab 02/02/24 0418 02/04/24 0402 02/06/24 0237 02/07/24 0332 02/08/24 0332  AST 28  --   --   --   --   ALT 20  --   --   --   --   ALKPHOS 71  --   --   --   --   BILITOT 0.8  --   --   --   --   PROT 6.6  --    --   --   --   ALBUMIN  3.4* 3.1* 3.2* 3.0* 3.2*    Cardiac Enzymes: Recent Labs  Lab 02/02/24 0418  CKTOTAL 61   ProBNP, BNP (last 5 results) Recent Labs    01/31/24 1436  PROBNP 1,218.0*   Anemia Panel: Recent Labs    02/06/24 0910  FOLATE 19.5   Sepsis Labs: Recent Labs  Lab 02/06/24 0910  PROCALCITON <0.10    Recent Results (from the past 240 hours)  Resp panel by RT-PCR (RSV, Flu A&B, Covid) Anterior Nasal Swab     Status: None   Collection Time: 01/31/24  8:58 AM   Specimen: Anterior Nasal Swab  Result Value Ref Range Status   SARS Coronavirus 2 by RT PCR NEGATIVE NEGATIVE Final    Comment: (NOTE) SARS-CoV-2 target nucleic acids are NOT DETECTED.  The SARS-CoV-2 RNA is generally detectable in upper respiratory specimens during the acute phase of infection. The lowest concentration of SARS-CoV-2 viral copies this assay can detect is 138 copies/mL. A negative result does not preclude SARS-Cov-2 infection and should not be used as the sole basis for treatment or other patient management decisions. A negative result may occur with  improper specimen collection/handling, submission of specimen other than nasopharyngeal swab, presence of viral mutation(s) within the areas targeted by this assay, and inadequate number of viral copies(<138 copies/mL). A negative result must be combined with clinical observations, patient history, and epidemiological information. The expected result is Negative.  Fact Sheet for Patients:  bloggercourse.com  Fact Sheet for Healthcare Providers:  seriousbroker.it  This test is no t yet approved or cleared by the [place]  FDA and  has been authorized for detection and/or diagnosis of SARS-CoV-2 by FDA under an Emergency Use Authorization (EUA). This EUA will  remain  in effect (meaning this test can be used) for the duration of the COVID-19 declaration under Section 564(b)(1)  of the Act, 21 U.S.C.section 360bbb-3(b)(1), unless the authorization is terminated  or revoked sooner.       Influenza A by PCR NEGATIVE NEGATIVE Final   Influenza B by PCR NEGATIVE NEGATIVE Final    Comment: (NOTE) The Xpert Xpress SARS-CoV-2/FLU/RSV plus assay is intended as an aid in the diagnosis of influenza from Nasopharyngeal swab specimens and should not be used as a sole basis for treatment. Nasal washings and aspirates are unacceptable for Xpert Xpress SARS-CoV-2/FLU/RSV testing.  Fact Sheet for Patients: bloggercourse.com  Fact Sheet for Healthcare Providers: seriousbroker.it  This test is not yet approved or cleared by the [place]  FDA and has been authorized for detection and/or diagnosis of SARS-CoV-2 by FDA under an Emergency Use Authorization (EUA). This EUA will remain in effect (meaning this test can be used) for the duration of the COVID-19 declaration under Section 564(b)(1) of the Act, 21 U.S.C. section 360bbb-3(b)(1), unless the authorization is terminated or revoked.     Resp Syncytial Virus by PCR NEGATIVE NEGATIVE Final    Comment: (NOTE) Fact Sheet for Patients: bloggercourse.com  Fact Sheet for Healthcare Providers: seriousbroker.it  This test is not yet approved or cleared by the [place]  FDA and has been authorized for detection and/or diagnosis of SARS-CoV-2 by FDA under an Emergency Use Authorization (EUA). This EUA will remain in effect (meaning this test can be used) for the duration of the COVID-19 declaration under Section 564(b)(1) of the Act, 21 U.S.C. section 360bbb-3(b)(1), unless the authorization is terminated or revoked.  Performed at [facility], [address] [place], [place] [address]   MRSA Next Gen by PCR, Nasal     Status: None   Collection Time: 02/01/24  2:40 PM   Specimen: Nasal Mucosa;  Nasal Swab  Result Value Ref Range Status   MRSA by PCR Next Gen NOT DETECTED NOT DETECTED Final    Comment: (NOTE) The GeneXpert MRSA Assay (FDA approved for NASAL specimens only), is one component of a comprehensive MRSA colonization surveillance program. It is not intended to diagnose MRSA infection nor to guide or monitor treatment for MRSA infections. Test performance is not FDA approved in patients less than 96 years old. Performed at [facility], [address] [place], [place] [address]   MRSA Next Gen by PCR, Nasal     Status: None   Collection Time: 02/05/24  2:18 PM   Specimen: Nasal Mucosa; Nasal Swab  Result Value Ref Range Status   MRSA by PCR Next Gen NOT DETECTED NOT DETECTED Final    Comment: (NOTE) The GeneXpert MRSA Assay (FDA approved for NASAL specimens only), is one component of a comprehensive MRSA colonization surveillance program. It is not intended to diagnose MRSA infection nor to guide or monitor treatment for MRSA infections. Test performance is not FDA approved in patients less than 29 years old. Performed at [facility], [address], [place], [place] [address]   Respiratory (~20 pathogens) panel by PCR     Status: None   Collection Time: 02/06/24  8:40 AM   Specimen: Nasopharyngeal Swab; Respiratory  Result Value Ref Range Status   Adenovirus NOT DETECTED NOT DETECTED Final   Coronavirus 229E NOT DETECTED NOT DETECTED Final    Comment: (NOTE) The Coronavirus on the Respiratory Panel, DOES NOT test for the novel  Coronavirus (2019 nCoV)  Coronavirus HKU1 NOT DETECTED NOT DETECTED Final   Coronavirus NL63 NOT DETECTED NOT DETECTED Final   Coronavirus OC43 NOT DETECTED NOT DETECTED Final   Metapneumovirus NOT DETECTED NOT DETECTED Final   Rhinovirus / Enterovirus NOT DETECTED NOT DETECTED Final   Influenza A NOT DETECTED NOT DETECTED Final   Influenza B NOT DETECTED NOT DETECTED Final   Parainfluenza  Virus 1 NOT DETECTED NOT DETECTED Final   Parainfluenza Virus 2 NOT DETECTED NOT DETECTED Final   Parainfluenza Virus 3 NOT DETECTED NOT DETECTED Final   Parainfluenza Virus 4 NOT DETECTED NOT DETECTED Final   Respiratory Syncytial Virus NOT DETECTED NOT DETECTED Final   Bordetella pertussis NOT DETECTED NOT DETECTED Final   Bordetella Parapertussis NOT DETECTED NOT DETECTED Final   Chlamydophila pneumoniae NOT DETECTED NOT DETECTED Final   Mycoplasma pneumoniae NOT DETECTED NOT DETECTED Final    Comment: Performed at [facility], [address] [place], [place] [address]     Radiology Studies: No results found.  Scheduled Meds:  apixaban   5 mg Oral BID   arformoterol   15 mcg Nebulization BID   Chlorhexidine  Gluconate Cloth  6 each Topical Daily   hydrocerin   Topical BID   levothyroxine   125 mcg Oral Q0600   metoprolol  succinate  75 mg Oral Daily   revefenacin   175 mcg Nebulization Daily   torsemide   40 mg Oral Daily   Continuous Infusions:   LOS: 8 days   Time spent: 65 minutes  Camellia Door, DO  [facility]  02/08/2024, 2:49 PM  "

## 2024-02-08 NOTE — Assessment & Plan Note (Addendum)
 02/08/2024 remains on bipap at night. 3 L/min O2.

## 2024-02-08 NOTE — Assessment & Plan Note (Signed)
 02/08/2024 see by wound care on

## 2024-02-08 NOTE — Assessment & Plan Note (Addendum)
 02/08/2024 back on Toprol -XL @ 75 mg daily. On oral demadex  40 mg daily. Echo shows LVEF 50% but had very poor echo windows.

## 2024-02-08 NOTE — Progress Notes (Signed)
" °   02/08/24 1420  Spiritual Encounters  Type of Visit Initial  Care provided to: Patient  Referral source Family  Reason for visit Urgent spiritual support  OnCall Visit No   Chaplain met with the patient, [name], as he settled into his new room. I confirmed that he did wish to see a priest and receive the anointing of the sick.   I contacted Father Marinell. He will be in 09 February 2024 around 1030.   Carley Birmingham [facility]  [phone] "

## 2024-02-08 NOTE — Plan of Care (Signed)
" °  Problem: Education: Goal: Knowledge of General Education information will improve Description: Including pain rating scale, medication(s)/side effects and non-pharmacologic comfort measures Outcome: Progressing   Problem: Clinical Measurements: Goal: Respiratory complications will improve Outcome: Progressing Goal: Cardiovascular complication will be avoided Outcome: Progressing   Problem: Coping: Goal: Level of anxiety will decrease Outcome: Progressing   Problem: Elimination: Goal: Will not experience complications related to bowel motility Outcome: Progressing Goal: Will not experience complications related to urinary retention Outcome: Progressing   Problem: Pain Managment: Goal: General experience of comfort will improve and/or be controlled Outcome: Progressing   Problem: Safety: Goal: Ability to remain free from injury will improve Outcome: Progressing   "

## 2024-02-08 NOTE — Assessment & Plan Note (Addendum)
 02/08/2024 on Eliquis  5 mg bid.

## 2024-02-08 NOTE — Hospital Course (Addendum)
 CC: fall, SOB HPI: [name] is a 73 y.o. male with medical history significant for morbid obesity, A-fib on Eliquis , OSA, status post left AKA, hypothyroidism, chronic systolic heart failure and DVT who presented to the ED for evaluation after a fall. Patient reports he lives alone but has a friend that checks on him often. He was attempting to move from his wheelchair to the bed when he fell on the ground. He was unable to get up on his own and laid down for about 8 hours before his friend found him and called EMS. He endorses some recent increase in leg swelling, mild shortness of breath and a chronic back pain but denies any cough, chest pain, fever, chills, abdominal pain, nausea, vomiting or dysuria.   Per EMS, patient was found to have SpO2 of 90% on room air, placed on 3 L [place]   ED Course: Initial vitals show afebrile, RR 18-20, HR 80-100, SBP 110-130s, SpO2, initially 95% on 3 L [place], transitioned to BiPAP. Initial labs significant for proBNP 1218, pCO2 60, flat troponin, Hgb 9.2 otherwise normal renal function, LFTs, lactic acid and white count. EKG shows A-fib. CXR shows cardiomegaly with vascular congestion.  CT C/A/P shows intramuscular hematoma in the anterior compartment of the proximal left thigh. Pt received IV LR 1 L bolus, IV Lasix  40 mg x 1, and IV Rocephin . TRH was consulted for admission.   Significant Events: Admitted 01/31/2024 for acute on chronic respiratory failure with hypercapnic and hypoxia, fall at home, left thigh hematoma, acute on chronic systolic CHF. Placed on bipap at time of admission. Systemic anticoagulation on hold due to intramuscular hematoma 02-01-2024 seen by wound care for right leg venous insufficiency ulcers 02-01-2024 remains on bipap and IV lasix  for respiratory failure and CHF.  02-02-2024 remains on IV lasix  and bipap. 02-03-2024 changed to po demadex . IV lasix  stopped. HgB stable. Eliquis  restarted. 02-04-2024 due to worsening hypercapnia. VBG  pH 7.21, PCO2 of 106, pt transferred to stepdown ICU and PCCM consulted 02-06-2024 pt has hospital acquired delirium and needs IV precedex  for sedation. He remains on Bipap 02-07-2024 PCCM states pt will need home Bipap machine.   Admission Labs: Covid, flu, rsv Negative Na 143, K 4.7, CO2 of 27, Bun 25, scr 0.91, glu 112 T. Prot 6.9, alb 3.5, AST 32, ALT 22, alk phos 75, t. Bili 1.3 WBC 6.2, HgB 9.2, plt 211 VBG pH 7.24, PCO2 of 82  Admission Imaging Studies: CXR Cardiomegaly with vascular congestion.  CT head, c-spine  No acute intracranial abnormality. 2. Limited CT of the cervical spine without evidence of acute fracture or traumatic malalignment. 3. Severe cervical degenerative change CT chest/abd/pelvis  Intramuscular hematoma in the anterior compartment of the proximal left thigh. 2. No acute/traumatic intrathoracic, abdominal, or pelvic pathology. 3. Cholelithiasis. 4.  Aortic Atherosclerosis CT T and L spine No obvious acute/traumatic thoracic or lumbar spine pathology. 2. Osteopenia and degenerative changes.  Significant Labs: Vit B12 of 1386 Folate >20 Fe 66, TIBC 302, %sat of 22 Ferritin 140  Significant Imaging Studies:   Antibiotic Therapy: Anti-infectives (From admission, onward)    Start     Dose/Rate Route Frequency Ordered Stop   01/31/24 1700  cefTRIAXone  (ROCEPHIN ) 1 g in sodium chloride  0.9 % 100 mL IVPB  Status:  Discontinued        1 g 200 mL/hr over 30 Minutes Intravenous  Once 01/31/24 1656 01/31/24 1658   01/31/24 1700  cefTRIAXone  (ROCEPHIN ) 2 g in sodium chloride   0.9 % 100 mL IVPB        2 g 200 mL/hr over 30 Minutes Intravenous  Once 01/31/24 1658 01/31/24 1908       Procedures:   Consultants: Wound care PCCM
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
## 2024-02-08 NOTE — Assessment & Plan Note (Addendum)
 02/08/2024 due to fall out of his wheelchair at time of admission. Back on Xarelto .

## 2024-02-08 NOTE — Assessment & Plan Note (Addendum)
 02/08/2024 on Synthroid  125 mcg daily.

## 2024-02-08 NOTE — Subjective & Objective (Signed)
 Pt seen and examined. Pt understands he needs to go to SNF at discharge.

## 2024-02-08 NOTE — Plan of Care (Signed)

## 2024-02-08 NOTE — Assessment & Plan Note (Addendum)
 02/08/2024 on Toprol -XL and demadex 

## 2024-02-08 NOTE — Progress Notes (Signed)
 Spoke with patient about stopping Ensure drinks due to high calorie count and no need for them medically.  He said he would stop drinking them when I explained to him that they're not doing him any good due to his weight and excellent meal intake. Reported this to next nurse as well.

## 2024-02-08 NOTE — Assessment & Plan Note (Signed)
 02/08/2024 pt has not stood up on his right leg in several months. He is wheelchair dependent. He understands he needs to go to SNF at discharge.

## 2024-02-08 NOTE — Progress Notes (Signed)
" °   02/08/24 2242  BiPAP/CPAP/SIPAP  BiPAP/CPAP/SIPAP Pt Type Adult  BiPAP/CPAP/SIPAP SERVO  Mask Type Full face mask  Dentures removed? Not applicable  Mask Size Small  Set Rate 16 breaths/min  Respiratory Rate 19 breaths/min  IPAP 20 cmH20  EPAP 6 cmH2O  Pressure Support 14 cmH20  PEEP 6 cmH20  FiO2 (%) 30 %  Minute Ventilation 11.7  Leak 70  Peak Inspiratory Pressure (PIP) 20  Tidal Volume (Vt) 567  Patient Home Machine No  Patient Home Mask No  Patient Home Tubing No  Auto Titrate No  Press High Alarm 30 cmH2O  Press Low Alarm 5 cmH2O  Nasal massage performed  (foam pad on bridge of nose)  CPAP/SIPAP surface wiped down Yes  Device Plugged into RED Power Outlet Yes    "
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
## 2024-02-08 NOTE — Assessment & Plan Note (Addendum)
 02/08/2024 fell out of wheelchair and onto floor. Probably where his left thigh hematoma came from.

## 2024-02-08 NOTE — Assessment & Plan Note (Addendum)
 02/08/2024 chronic.

## 2024-02-09 DIAGNOSIS — J9601 Acute respiratory failure with hypoxia: Secondary | ICD-10-CM | POA: Diagnosis not present

## 2024-02-09 DIAGNOSIS — J9602 Acute respiratory failure with hypercapnia: Secondary | ICD-10-CM | POA: Diagnosis not present

## 2024-02-09 NOTE — Progress Notes (Addendum)
" °   02/09/24 1435  Spiritual Encounters  Type of Visit Initial  Care provided to: Patient  Referral source Family  Reason for visit Religious ritual  OnCall Visit No  Interventions  Spiritual Care Interventions Made Established relationship of care and support;Decision-making support/facilitation;Explored values/beliefs/practices/strengths;Other (comment) (birthday)   I provided spiritual care support to Mr [name] after speaking with his niece earlier this afternoon. Mr Lingenfelter welcomed my visit. He shared that Fr Marinell had just left after offering an anointing ritual and how this had been meaningful and welcome. This led to further reflection and [name] shared memories of his sister, Bruna, and his father who have passed on.This led to memories of home in [place] and possibility of returning there.  I established a relationship of care and celebrated with [name] on his birthday today. I affirmed the loving gesture of his niece and invited reflection on the experience of the priest visit and ritual. I explored briefly feelings and sense of meaning and values that will support his decision making. I wished [name] well as he moves forward.   Tesla Keeler L. Delores HERO.Div "

## 2024-02-09 NOTE — TOC Progression Note (Addendum)
 Transition of Care [facility]) - Progression Note    Patient Details  Name: [name] MRN: [id number] Date of Birth: 1950-06-26  Transition [facility]) CM/SW Contact  Langston Summerfield, Nathanel, RN Phone Number: 02/09/2024, 12:54 PM  Clinical Narrative: Patient chose [facility] on cal rep Martina#[phone] aware-she will f/u on ordering bipap & ensure she has all info needed to place order;aware of wt; will initiate auth.  -1p-attempted to initiate auth-[facility] closed to confirm patients insurance since noted outside of dates 02/08/2024. Will contact office in am to confirm insurance dates effective.     Expected Discharge Plan: Skilled Nursing Facility Barriers to Discharge: Insurance Authorization               Expected Discharge Plan and Services   Discharge Planning Services: CM Consult   Living arrangements for the past 2 months: Apartment                                       Social Drivers of Health (SDOH) Interventions SDOH Screenings   Food Insecurity: Patient Unable To Answer (02/05/2024)  Housing: Low Risk (02/05/2024)  Transportation Needs: Patient Unable To Answer (02/05/2024)  Utilities: Patient Unable To Answer (02/05/2024)  Financial Resource Strain: Low Risk (04/13/2023)   Received from [facility]  Physical Activity: Unknown (04/13/2023)   Received from [facility]  Social Connections: Patient Unable To Answer (02/05/2024)  Stress: Stress Concern Present (04/13/2023)   Received from [facility]  Tobacco Use: Low Risk (01/31/2024)    Readmission Risk Interventions     No data to display

## 2024-02-09 NOTE — Progress Notes (Signed)
" °   02/09/24 2226  BiPAP/CPAP/SIPAP  BiPAP/CPAP/SIPAP Pt Type Adult  BiPAP/CPAP/SIPAP SERVO  Mask Type Full face mask  Dentures removed? Not applicable  Mask Size Small  Set Rate 16 breaths/min  Respiratory Rate 28 breaths/min  IPAP 20 cmH20 (NIV PC 14 above PEEP 6)  EPAP 6 cmH2O  FiO2 (%) 30 %  Minute Ventilation 23.6  Peak Inspiratory Pressure (PIP) 21  Tidal Volume (Vt) 601  Patient Home Machine No  Patient Home Mask No  Patient Home Tubing No  Auto Titrate No  Press High Alarm 30 cmH2O  Press Low Alarm 4 cmH2O  CPAP/SIPAP surface wiped down Yes  Device Plugged into RED Power Outlet Yes  BiPAP/CPAP /SiPAP Vitals  Pulse Rate 94  Resp (!) 28  SpO2 96 %  Bilateral Breath Sounds Clear;Diminished  MEWS Score/Color  MEWS Score 2  MEWS Score Color Yellow    "

## 2024-02-09 NOTE — Progress Notes (Signed)
" °   02/09/24 1310  Spiritual Encounters  Type of Visit Initial  Care provided to: Family  Reason for visit Religious ritual  OnCall Visit No   I received a page and call was from Mr Pursley niece, Olam, who is in [place] . She stated that she called her uncle and he had not yet had a visit from Fr. Marinell. I apologized if there had been a delay or miscommunication and offered to reach out to Fr. Marinell. I called priest and got voice mail. I returned Lisa's call to give her that update and shared that I would also plan to visit her uncle to offer my support as well as to continue reaching out to Fr. Marinell. She appreciated the call and agreed with that plan. Will continue to follow and support as able.  Maricel Swartzendruber L. Delores HERO.Div   "

## 2024-02-09 NOTE — Progress Notes (Signed)
 Physical Therapy Treatment Patient Details Name: [name] MRN: [id number] DOB: 02/03/1951 Today's Date: 02/09/2024   History of Present Illness Patient is 73y.o. male admitted from home after a fall from his wheelchair and was down for ~8 hours. Reported some leg swelling and SOB. CT chest, abdomen and pelvis showed intramuscular hematoma in the anterior compartment of R thigh. Other significant PMH includes morbid obesity, L AKA, A-fib, hypothyroidism, GERD, headaches, CHF.    PT Comments  Co/tx with OT with goal of getting pt up to recliner. Pt anxious about use of lift equipment but agreeable. Pt rolled with decreased physical assistance compared to previous sessions, +2 for safety at MOD A each. Tenor lift used to transfer pt to [facility] with +3 for safety, positioning and line management. Pt comfortable in recliner and enjoys being upright. Encouraged pt to complete LE/UE ROM exercises to tolerance, goal of staying up in recliner for 2hrs. Continue to recommend SNF at discharge for <3hrs/day of skilled therapy.     If plan is discharge home, recommend the following: Assist for transportation;Two people to help with walking and/or transfers;Two people to help with bathing/dressing/bathroom;Help with stairs or ramp for entrance   Can travel by private vehicle     No  Equipment Recommendations  None recommended by PT    Recommendations for Other Services       Precautions / Restrictions Precautions Precautions: Fall Recall of Precautions/Restrictions: Intact Precaution/Restrictions Comments: watch O2/HR, L AKA Restrictions Weight Bearing Restrictions Per Provider Order: No     Mobility  Bed Mobility Overal bed mobility: Needs Assistance Bed Mobility: Rolling Rolling: +2 for physical assistance, +2 for safety/equipment, Used rails, Mod assist   Supine to sit: Total assist, +2 for physical assistance Sit to supine: Total assist, +2 for physical assistance   General bed  mobility comments: rolling completed x3 each direction to allow for pad placement and changing to ensure proper comfort for transfers out of bed; improved rolling this session with use of bed features and pads    Transfers Overall transfer level: Needs assistance   Transfers: Bed to chair/wheelchair/BSC             General transfer comment: +2 for safety and a 3rd just since OOB 1st time and lines/purewick tubing mngt Transfer via Lift Equipment: Tenor Lift  Ambulation/Gait                   Stairs             Wheelchair Mobility     Tilt Bed    Modified Rankin (Stroke Patients Only)       Balance Overall balance assessment: Needs assistance, History of Falls Sitting-balance support: Feet supported, Bilateral upper extremity supported Sitting balance-Leahy Scale: Fair Sitting balance - Comments: recliner level with R LE elevated Postural control: Posterior lean                                  Communication Communication Communication: Impaired Factors Affecting Communication: Hearing impaired  Cognition Arousal: Alert Behavior During Therapy: WFL for tasks assessed/performed                             Following commands: Impaired Following commands impaired: Follows multi-step commands with increased time    Cueing Cueing Techniques: Verbal cues, Tactile cues  Exercises      General Comments  General comments (skin integrity, edema, etc.): pt anxious with mobility using lift but agreeable, enjoyed being up in the recliner once there      Pertinent Vitals/Pain Pain Assessment Pain Assessment: Faces Faces Pain Scale: No hurt Pain Intervention(s): Monitored during session    Home Living                          Prior Function            PT Goals (current goals can now be found in the care plan section) Acute Rehab PT Goals Patient Stated Goal: get back home by myself PT Goal Formulation: With  patient Time For Goal Achievement: 02/16/24 Potential to Achieve Goals: Fair Progress towards PT goals: Progressing toward goals    Frequency    Min 2X/week      PT Plan      Co-evaluation   Reason for Co-Treatment: For patient/therapist safety PT goals addressed during session: Mobility/safety with mobility;Balance;Proper use of DME;Strengthening/ROM OT goals addressed during session: ADL's and self-care;Proper use of [employer] equipment and DME;Strengthening/ROM      AM-PAC PT 6 Clicks Mobility   Outcome Measure  Help needed turning from your back to your side while in a flat bed without using bedrails?: Total Help needed moving from lying on your back to sitting on the side of a flat bed without using bedrails?: Total Help needed moving to and from a bed to a chair (including a wheelchair)?: Total Help needed standing up from a chair using your arms (e.g., wheelchair or bedside chair)?: Total Help needed to walk in hospital room?: Total Help needed climbing 3-5 steps with a railing? : Total 6 Click Score: 6    End of Session Equipment Utilized During Treatment: Oxygen;Other (comment) (tenor lift) Activity Tolerance: Patient tolerated treatment well;Other (comment) (anxious about use of tenor lift) Patient left: in chair;with call bell/phone within reach Nurse Communication: Mobility status;Need for lift equipment PT Visit Diagnosis: Muscle weakness (generalized) (M62.81);History of falling (Z91.81);Other abnormalities of gait and mobility (R26.89)     Time: 1125-1200 PT Time Calculation (min) (ACUTE ONLY): 35 min  Charges:    $Therapeutic Activity: 8-22 mins                       [name] DEL. Reisa Coppola, PT, DPT   [employer] 02/09/2024, 12:35 PM

## 2024-02-09 NOTE — Progress Notes (Signed)
 Occupational Therapy Treatment Patient Details Name: [name] MRN: [id number] DOB: Nov 08, 1950 Today's Date: 02/09/2024   History of present illness Patient is 73y.o. male admitted from home after a fall from his wheelchair and was down for ~8 hours. Reported some leg swelling and SOB. CT chest, abdomen and pelvis showed intramuscular hematoma in the anterior compartment of R thigh. Other significant PMH includes morbid obesity, L AKA, A-fib, hypothyroidism, GERD, headaches, CHF.   OT comments  Patient seen for skilled OT session this am. Patient open to all therapy presented and agreeable and motivated. Use of Tenor lift for 1st time up OOB clinically appropriate and patient tolerated well. Demonstrated increased tolerance for rolling multiple times for lift pad postioning and general body mechanics, positioning and pressure reliefs during session. Issued simple HEP for carryover between visits. Patient requires continued Acute care hospital level OT services to progress safety and functional performance and allow for discharge. Patient will benefit from continued inpatient follow up therapy, <3 hours/day.         If plan is discharge home, recommend the following:  Two people to help with walking and/or transfers;Two people to help with bathing/dressing/bathroom;Assistance with cooking/housework;Direct supervision/assist for medications management;Direct supervision/assist for financial management;Assist for transportation;Help with stairs or ramp for entrance;Supervision due to cognitive status   Equipment Recommendations  Hoyer lift       Precautions / Restrictions Precautions Precautions: Fall Recall of Precautions/Restrictions: Intact Precaution/Restrictions Comments: watch O2/HR, L AKA Restrictions Weight Bearing Restrictions Per Provider Order: No       Mobility Bed Mobility Overal bed mobility: Needs Assistance Bed Mobility: Rolling Rolling: +2 for physical  assistance, +2 for safety/equipment, Used rails, Mod assist         General bed mobility comments: improved rolling this session with use of bed features and pads for up the bed and to place lift pad    Transfers Overall transfer level: Needs assistance   Transfers: Bed to chair/wheelchair/BSC             General transfer comment: +2 for safety and a 3rd just since OOB 1st time and lines/purewick tubing mngt Transfer via Lift Equipment: Tenor Lift   Balance Overall balance assessment: Needs assistance, History of Falls Sitting-balance support: Feet supported, Bilateral upper extremity supported Sitting balance-Leahy Scale: Fair Sitting balance - Comments: recliner level with R LE elevated Postural control: Posterior lean                                 ADL either performed or assessed with clinical judgement   ADL Overall ADL's : Needs assistance/impaired Eating/Feeding: Set up;Sitting               Upper Body Dressing : Maximal assistance;Sitting   Lower Body Dressing: Total assistance Lower Body Dressing Details (indicate cue type and reason): for slipper sock on R LE             Functional mobility during ADLs: +2 for physical assistance;+2 for safety/equipment;Total assistance (moved OOB to j[employer] engineering with Tenor lift with good tolerance) General ADL Comments: improved tolerance for OOB via lift    Extremity/Trunk Assessment Upper Extremity Assessment Upper Extremity Assessment: Generalized weakness;Right hand dominant   Lower Extremity Assessment Lower Extremity Assessment: Defer to PT evaluation                 Communication Communication Communication: Impaired Factors Affecting Communication: Hearing impaired  Cognition Arousal: Alert Behavior During Therapy: WFL for tasks assessed/performed Cognition: Cognition impaired             OT - Cognition Comments: improved overall processing this session, mild decreased  insight into deficits and capabilities given effects of prolonged bed rest and current critical illness                 Following commands: Impaired Following commands impaired: Follows multi-step commands with increased time      Cueing   Cueing Techniques: Verbal cues, Tactile cues  Exercises Exercises: Other exercises (ankle pumps on L LE and foam grasp ball squeezes 10 reps q hr)    Shoulder Instructions       General Comments R LE gauze bandage c/d/i, SpO2 92-94% on 3 ltrs via Mentor    Pertinent Vitals/ Pain       Pain Assessment Pain Assessment: Faces Faces Pain Scale: No hurt Pain Intervention(s): Monitored during session   Frequency  Min 2X/week        Progress Toward Goals  OT Goals(current goals can now be found in the care plan section)  Progress towards OT goals: Progressing toward goals  Acute Rehab OT Goals Patient Stated Goal: to get up OT Goal Formulation: With patient Time For Goal Achievement: 02/17/24 Potential to Achieve Goals: Fair ADL Goals Pt Will Perform Upper Body Bathing: with min assist;sitting Pt Will Perform Upper Body Dressing: with min assist;sitting Pt Will Transfer to Toilet: with +2 assist;with transfer board;bedside commode;with mod assist Pt/caregiver will Perform Home Exercise Program: Increased strength;With Supervision;Both right and left upper extremity;With written HEP provided;Increased ROM Additional ADL Goal #1: Patient will demonstrate 4/5 ECT strategies with min cues for BADL's and mobility  Plan      Co-evaluation      Reason for Co-Treatment: For patient/therapist safety PT goals addressed during session: Mobility/safety with mobility;Balance;Proper use of DME;Strengthening/ROM OT goals addressed during session: ADL's and self-care;Proper use of Adaptive equipment and DME;Strengthening/ROM      AM-PAC OT 6 Clicks Daily Activity     Outcome Measure   Help from another person eating meals?: A  Little Help from another person taking care of personal grooming?: A Little Help from another person toileting, which includes using toliet, bedpan, or urinal?: Total Help from another person bathing (including washing, rinsing, drying)?: A Lot Help from another person to put on and taking off regular upper body clothing?: A Lot Help from another person to put on and taking off regular lower body clothing?: Total 6 Click Score: 12    End of Session Equipment Utilized During Treatment: Oxygen;Other (comment) (Tenor lift)  OT Visit Diagnosis: Unsteadiness on feet (R26.81);Other abnormalities of gait and mobility (R26.89);History of falling (Z91.81);Muscle weakness (generalized) (M62.81);Cognitive communication deficit (R41.841);Pain   Activity Tolerance Patient limited by fatigue   Patient Left in chair;with call bell/phone within reach   Nurse Communication Mobility status;Other (comment) (use of Tenor or maxi sky back to bed)        Time: 1125-1200 OT Time Calculation (min): 35 min  Charges: OT General Charges $OT Visit: 1 Visit OT Treatments $Therapeutic Activity: 8-22 mins  [name] OT/L Acute Rehabilitation Department  [phone]  02/09/2024, 12:26 PM

## 2024-02-09 NOTE — Progress Notes (Signed)
" °   02/09/24 2352  BiPAP/CPAP/SIPAP  BiPAP/CPAP/SIPAP Pt Type Adult  BiPAP/CPAP/SIPAP SERVO  Mask Type Full face mask  Dentures removed? Not applicable  Mask Size Small  Set Rate 16 breaths/min  Respiratory Rate 21 breaths/min  IPAP 20 cmH20  EPAP 6 cmH2O  FiO2 (%) 30 %  Minute Ventilation 11.7  Leak 38  Peak Inspiratory Pressure (PIP) 19  Tidal Volume (Vt) 648  Patient Home Machine No  Patient Home Mask No  Patient Home Tubing No  Auto Titrate No  Press High Alarm 30 cmH2O  Device Plugged into RED Power Outlet Yes  BiPAP/CPAP /SiPAP Vitals  Pulse Rate 96  Resp (!) 21  SpO2 96 %  Bilateral Breath Sounds Clear;Diminished  MEWS Score/Color  MEWS Score 1  MEWS Score Color Green    "

## 2024-02-09 NOTE — Progress Notes (Signed)
 Mobility Specialist - Progress Note   02/09/24 1600  Mobility  Activity Mechanically lifted from bed to chair  Assistive Device Tenor  Activity Response Tolerated well  Mobility visit 1 Mobility  Mobility Specialist Start Time (ACUTE ONLY) 1546  Mobility Specialist Stop Time (ACUTE ONLY) 1610  Mobility Specialist Time Calculation (min) (ACUTE ONLY) 24 min   Received in chair requesting to return to bed. Using tenor lift, mechanically lifted pt back to bed and left with all needs met.  Cyndee Ada Mobility Specialist

## 2024-02-09 NOTE — Progress Notes (Signed)
 " [facility] Progress Note  Patient: [name]     FMW:[id number]  DOA: 01/31/2024   PCP: Pura Lenis, MD       Brief hospital course: 74 y.o. male with medical history significant for morbid obesity, A-fib on Eliquis , OSA, status post left AKA, hypothyroidism, chronic systolic heart failure and DVT who presented to the ED for evaluation after a fall. Patient reports he lives alone but has a friend that checks on him often. He was attempting to move from his wheelchair to the bed when he fell on the ground. He was unable to get up on his own and laid down for about 8 hours before his friend found him and called EMS. He endorses some recent increase in leg swelling, mild shortness of breath and a chronic back pain.  In ED pulse ox in 90s, RR > 20 and HR > 100 -requited BiPAP. CXR showed vascular congestion.   Subjective:  No complaints.   Assessment and Plan: Principal Problem:   Acute respiratory failure with hypoxia and hypercapnia  - has  improved  - continue at bedtime BiPAP  Active Problems:   Acute on chronic systolic CHF (congestive heart failure)  - resolved after diuresis    Fall - out of his wheelchair    Morbid obesity with BMI of 70 and over, adult  Body mass index is 69.15 kg/m.     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  Cardiovascular system: S1 & S2 heard  Gastrointestinal system: Abdomen soft, non-tender, nondistended. Normal bowel sounds   Extremities: No cyanosis, clubbing or edema- right leg is wrapped Psychiatry:  Mood & affect appropriate.      CBC: Recent Labs  Lab 02/04/24 0402 02/05/24 0929 02/05/24 1427 02/06/24 0237 02/07/24 0332 02/08/24 0332  WBC 5.1 5.7  --  4.0 3.4* 3.3*  HGB 8.2* 8.9* 8.2* 7.7* 8.0* 8.5*  HCT 27.9* 31.4* 24.0* 26.3* 26.8* 29.1*  MCV 104.5* 107.2*  --  105.2* 102.7* 102.5*  PLT 173 180  --  163 144* 159   Basic Metabolic Panel: Recent Labs  Lab 02/04/24 0402 02/05/24 0929 02/05/24 1427 02/06/24 0237 02/07/24 0332 02/08/24 0332  NA 139 141 136 144 139 143  K 4.3 5.3* 5.2* 4.6 4.0 3.6  CL 100 99  --  100 98 100  CO2 31 37*  --  37* 33* 34*  GLUCOSE 111* 122*  --  98 86 137*  BUN 23 28*  --  30* 30* 28*  CREATININE 0.94 1.02  --  1.03 0.93 0.96  CALCIUM  9.7 10.0  --  10.0 9.5 9.7  MG 2.5* 2.7*  --  2.3 2.4 2.5*  PHOS 3.2  --   --  1.5* 2.4* 3.1     Scheduled Meds:  apixaban   5 mg Oral BID   arformoterol   15 mcg Nebulization BID   Chlorhexidine  Gluconate Cloth  6 each Topical Daily   hydrocerin   Topical BID   levothyroxine   125 mcg Oral Q0600   metoprolol  succinate  75 mg Oral Daily   revefenacin   175 mcg Nebulization Daily   torsemide   40 mg Oral Daily    Imaging and lab data personally reviewed   Author: Akya Fiorello  02/09/2024 1:13 PM  To contact [facility]>   Check the care team in [facility] and look for the attending/consulting [facility] provider listed  Log into [email or web address] and use [facility] universal password   Go to> [facility]  and find provider  If you still have difficulty reaching the provider, please page the [facility] (Director on Call) for the Hospitalists listed on amion     "

## 2024-02-10 ENCOUNTER — Other Ambulatory Visit (HOSPITAL_COMMUNITY): Payer: Self-pay

## 2024-02-10 DIAGNOSIS — J9601 Acute respiratory failure with hypoxia: Secondary | ICD-10-CM | POA: Diagnosis not present

## 2024-02-10 DIAGNOSIS — J9602 Acute respiratory failure with hypercapnia: Secondary | ICD-10-CM | POA: Diagnosis not present

## 2024-02-10 NOTE — Plan of Care (Signed)
" °  Problem: Education: Goal: Ability to demonstrate management of disease process will improve Outcome: Progressing Goal: Ability to verbalize understanding of medication therapies will improve Outcome: Progressing Goal: Individualized Educational Video(s) Outcome: Progressing   Problem: Activity: Goal: Capacity to carry out activities will improve Outcome: Not Progressing   Problem: Cardiac: Goal: Ability to achieve and maintain adequate cardiopulmonary perfusion will improve Outcome: Adequate for Discharge   Problem: Education: Goal: Knowledge of General Education information will improve Description: Including pain rating scale, medication(s)/side effects and non-pharmacologic comfort measures Outcome: Progressing   Problem: Health Behavior/Discharge Planning: Goal: Ability to manage health-related needs will improve Outcome: Progressing   Problem: Clinical Measurements: Goal: Ability to maintain clinical measurements within normal limits will improve Outcome: Progressing Goal: Will remain free from infection Outcome: Progressing Goal: Diagnostic test results will improve Outcome: Progressing Goal: Respiratory complications will improve Outcome: Progressing Goal: Cardiovascular complication will be avoided Outcome: Progressing   Problem: Activity: Goal: Risk for activity intolerance will decrease Outcome: Not Progressing   Problem: Nutrition: Goal: Adequate nutrition will be maintained Outcome: Progressing   Problem: Coping: Goal: Level of anxiety will decrease Outcome: Progressing   Problem: Elimination: Goal: Will not experience complications related to bowel motility Outcome: Progressing Goal: Will not experience complications related to urinary retention Outcome: Progressing   Problem: Pain Managment: Goal: General experience of comfort will improve and/or be controlled Outcome: Progressing   Problem: Safety: Goal: Ability to remain free from injury  will improve Outcome: Progressing   "

## 2024-02-10 NOTE — Progress Notes (Addendum)
 " [facility] Progress Note  Patient: [name]     FMW:[id number]  DOA: 01/31/2024   PCP: Garrett Lenis, MD       Brief hospital course: 74 y.o. male with medical history significant for morbid obesity, A-fib on Eliquis , OSA, status post left AKA, hypothyroidism, chronic systolic heart failure and DVT who presented to the ED for evaluation after a fall. Patient reports he lives alone but has a friend that checks on him often. He was attempting to move from his wheelchair to the bed when he fell on the ground. He was unable to get up on his own and laid down for about 8 hours before his friend found him and called EMS. He endorses some recent increase in leg swelling, mild shortness of breath and a chronic back pain.  In ED pulse ox in 90s, RR > 20 and HR > 100 -requited BiPAP. CXR showed vascular congestion.   Subjective:  No complaints.   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 Alarms and internal battery are required to ensure continuous support and immediate recognition of ventilatory insufficiency. A RAD with backup rate is insufficient due to the severity of illness. Volume ventilation is needed to reduce work of breathing and fatigue and to prevent life threatening exacerbations. OSA is not the predominant cause of hypercapnia. PCO2 = 62 on ABG.  Active Problems:   Acute on chronic systolic CHF (congestive heart failure)  - resolved after diuresis    Fall - out of his wheelchair    Morbid obesity with BMI of 70 and over, adult  Body mass index is 68.85 kg/m.  - will benefit from GLP 1 agonist     Intramuscular hematoma - due to fall- Xarelto  resumed    Ulcer  of extremity due to chronic venous insufficiency (HCC) - right leg  - cont wound care and follow  Dispostion waiting on SNF approval with bariatric bed    Code Status: Full Code Total time on patient care: 35 min DVT prophylaxis:   apixaban  (ELIQUIS ) tablet 5 mg     Objective:   Vitals:   02/10/24 0311 02/10/24 0525 02/10/24 0759 02/10/24 1039  BP:  108/73    Pulse: 79 85  98  Resp: 19 16    Temp:  98.3 F (36.8 C)    TempSrc:  Oral    SpO2: 96% 94% 93%   Weight:  (!) 205.4 kg    Height:       Filed Weights   02/08/24 0500 02/09/24 0500 02/10/24 0525  Weight: (!) 206.7 kg (!) 206.3 kg (!) 205.4 kg   Exam: General exam: Appears comfortable  HEENT: oral mucosa moist Respiratory system: Clear to auscultation.  Cardiovascular system: S1 & S2 heard  Gastrointestinal system: Abdomen soft, non-tender, nondistended. Normal bowel sounds   Extremities: No cyanosis, clubbing or edema- right leg is wrapped Psychiatry:  Mood & affect appropriate.      CBC: Recent Labs  Lab 02/04/24 0402 02/05/24 0929 02/05/24 1427 02/06/24 0237 02/07/24 0332 02/08/24 0332  WBC 5.1 5.7  --  4.0 3.4* 3.3*  HGB 8.2* 8.9* 8.2* 7.7* 8.0* 8.5*  HCT 27.9* 31.4* 24.0* 26.3* 26.8* 29.1*  MCV 104.5*  107.2*  --  105.2* 102.7* 102.5*  PLT 173 180  --  163 144* 159   Basic Metabolic Panel: Recent Labs  Lab 02/04/24 0402 02/05/24 0929 02/05/24 1427 02/06/24 0237 02/07/24 0332 02/08/24 0332  NA 139 141 136 144 139 143  K 4.3 5.3* 5.2* 4.6 4.0 3.6  CL 100 99  --  100 98 100  CO2 31 37*  --  37* 33* 34*  GLUCOSE 111* 122*  --  98 86 137*  BUN 23 28*  --  30* 30* 28*  CREATININE 0.94 1.02  --  1.03 0.93 0.96  CALCIUM  9.7 10.0  --  10.0 9.5 9.7  MG 2.5* 2.7*  --  2.3 2.4 2.5*  PHOS 3.2  --   --  1.5* 2.4* 3.1     Scheduled Meds:  apixaban   5 mg Oral BID   arformoterol   15 mcg Nebulization BID   hydrocerin   Topical BID   levothyroxine   125 mcg Oral Q0600   metoprolol  succinate  75 mg  Oral Daily   revefenacin   175 mcg Nebulization Daily   torsemide   40 mg Oral Daily    Imaging and lab data personally reviewed   Author: Anias Bartol  02/10/2024 1:55 PM  To contact [facility]>   Check the care team in [facility] and look for the attending/consulting [facility] provider listed  Log into [email or web address] and use [facility] universal password   Go to> [facility]  and find provider  If you still have difficulty reaching the provider, please page the [facility] (Director on Call) for the Hospitalists listed on amion     "

## 2024-02-10 NOTE — TOC Progression Note (Addendum)
 Transition of Care [facility]) - Progression Note    Patient Details  Name: [name] MRN: [id number] Date of Birth: 05-24-1950  [facility] CM/SW Contact  Syris Brookens, Nathanel, RN Phone Number: 02/10/2024, 11:20 AM  Clinical Narrative: Reeda shara shara pi#[id number] auth for [facility] lvm w/Casey-for update on ordering Bipap. -12p Received call from [facility] rep Casey-they have just looked @ wt-[phone](which is noted on d/c summary) they are unable to accept d/t not able to accommodate the wt with bed, & other accommodations;LVM w/[facility] rep Tiffany await call back for acceptance;& bipap ordering.  -4:39p-[facility] rep Rosina following for Bipap @ home-await outcome.No facility to accept for [facility] SN[facility] d/t not able to meet wt accomodations. Received [facility] referral rep Ciera/[facility] rep DJ  to screen-await outcome. -4:44p [facility] rep Ciera-unable to accept.    Expected Discharge Plan: Skilled Nursing Facility Barriers to Discharge: Continued Medical Work up               Expected Discharge Plan and Services   Discharge Planning Services: CM Consult   Living arrangements for the past 2 months: Apartment                                       Social Drivers of Health (SDOH) Interventions SDOH Screenings   Food Insecurity: Patient Unable To Answer (02/05/2024)  Housing: Low Risk (02/05/2024)  Transportation Needs: Patient Unable To Answer (02/05/2024)  Utilities: Patient Unable To Answer (02/05/2024)  Financial Resource Strain: Low Risk (04/13/2023)   Received from [facility]  Physical Activity: Unknown (04/13/2023)   Received from [facility]  Social Connections: Patient Unable To Answer (02/05/2024)  Stress: Stress Concern Present (04/13/2023)   Received from [facility]  Tobacco Use: Low Risk (01/31/2024)    Readmission Risk Interventions     No data to display

## 2024-02-10 NOTE — Progress Notes (Signed)
" °   02/10/24 0311  BiPAP/CPAP/SIPAP  BiPAP/CPAP/SIPAP Pt Type Adult  BiPAP/CPAP/SIPAP SERVO  Mask Type Full face mask  Dentures removed? Not applicable  Mask Size Small  Set Rate 16 breaths/min  Respiratory Rate 19 breaths/min  IPAP 20 cmH20  EPAP 6 cmH2O  FiO2 (%) 30 %  Minute Ventilation 13.8  Leak 67  Peak Inspiratory Pressure (PIP) 20  Tidal Volume (Vt) 646  Patient Home Machine No  Patient Home Mask No  Patient Home Tubing No  Auto Titrate No  Press High Alarm 30 cmH2O  Device Plugged into RED Power Outlet Yes  BiPAP/CPAP /SiPAP Vitals  Pulse Rate 79  Resp 19  SpO2 96 %  Bilateral Breath Sounds Diminished  MEWS Score/Color  MEWS Score 0  MEWS Score Color Green    "

## 2024-02-10 NOTE — Progress Notes (Signed)
 Nurse and CCMD saw a run of V tach on monitor.  Pt was asleep at the time.  Nurse went to room and woke pt up.  Pt stated he felt a bit of chest pressure and wanted all the blankets off.  Notified MD.  Will continue to monitor.

## 2024-02-11 DIAGNOSIS — J9601 Acute respiratory failure with hypoxia: Secondary | ICD-10-CM | POA: Diagnosis not present

## 2024-02-11 DIAGNOSIS — J9602 Acute respiratory failure with hypercapnia: Secondary | ICD-10-CM | POA: Diagnosis not present

## 2024-02-11 LAB — BASIC METABOLIC PANEL WITH GFR
Anion gap: 9 (ref 5–15)
BUN: 21 mg/dL (ref 8–23)
CO2: 33 mmol/L — ABNORMAL HIGH (ref 22–32)
Calcium: 9.5 mg/dL (ref 8.9–10.3)
Chloride: 101 mmol/L (ref 98–111)
Creatinine, Ser: 0.91 mg/dL (ref 0.61–1.24)
GFR, Estimated: >60 mL/min
Glucose, Bld: 88 mg/dL (ref 70–99)
Potassium: 3.7 mmol/L (ref 3.5–5.1)
Sodium: 143 mmol/L (ref 135–145)

## 2024-02-11 NOTE — Progress Notes (Signed)
" °   02/10/24 2224  BiPAP/CPAP/SIPAP  $ Face Mask Medium Yes  BiPAP/CPAP/SIPAP Pt Type Adult  BiPAP/CPAP/SIPAP SERVO  Mask Type Full face mask  Dentures removed? Not applicable  Mask Size Medium  Set Rate 16 breaths/min  Respiratory Rate 24 breaths/min  IPAP 20 cmH20  EPAP 6 cmH2O  FiO2 (%) 30 %  Minute Ventilation 12.9  Leak 82  Peak Inspiratory Pressure (PIP) 18  Tidal Volume (Vt) 536  Patient Home Machine No  Patient Home Mask No  Patient Home Tubing No  Auto Titrate No  Press High Alarm 30 cmH2O  BiPAP/CPAP /SiPAP Vitals  Pulse Rate 82  Resp (!) 24  SpO2 96 %  Bilateral Breath Sounds Diminished  MEWS Score/Color  MEWS Score 1  MEWS Score Color Green    "

## 2024-02-11 NOTE — Progress Notes (Signed)
 " [facility] Progress Note  Patient: [name]     FMW:[id number]  DOA: 01/31/2024   PCP: Pura Lenis, MD       Brief hospital course: 74 y.o. male with medical history significant for morbid obesity, A-fib on Eliquis , OSA, status post left AKA, hypothyroidism, chronic systolic heart failure and DVT who presented to the ED for evaluation after a fall. Patient reports he lives alone but has a friend that checks on him often. He was attempting to move from his wheelchair to the bed when he fell on the ground. He was unable to get up on his own and laid down for about 8 hours before his friend found him and called EMS. He endorses some recent increase in leg swelling, mild shortness of breath and a chronic back pain.  In ED pulse ox in 90s, RR > 20 and HR > 100 -requited BiPAP. CXR showed vascular congestion.   Subjective:  He states he wants to try to get out of bed without the hoyer lift today.   Assessment and Plan: Principal Problem:   Acute respiratory failure with hypoxia and hypercapnia  - has  improved  - continue at bedtime BiPAP- hopefully can get this approved by tomorrow from his insurance company Mr. 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 Alarms and internal battery are required to ensure continuous support and immediate recognition of ventilatory insufficiency. A RAD with backup rate is insufficient due to the severity of illness. Volume ventilation is needed to reduce work of breathing and fatigue and to prevent life threatening exacerbations. OSA is not the predominant cause of hypercapnia. PCO2 = 62 on ABG.  Active Problems:   Acute on chronic systolic CHF (congestive heart failure)  - resolved after diuresis    Fall - out of his wheelchair    Morbid obesity with BMI of 70 and over, adult  Body mass index is 64.63 kg/m.  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     Code Status: Full Code Total time on patient care: 35 min DVT prophylaxis:   apixaban  (ELIQUIS ) tablet 5 mg     Objective:   Vitals:   02/11/24 0157 02/11/24 0439 02/11/24 0938 02/11/24 1002  BP:  111/66 105/71   Pulse:  98 97   Resp: 15 20 20    Temp:  98.1 F (36.7 C) 98.5 F (36.9 C)   TempSrc:   Oral   SpO2:  94% 94% 94%  Weight:  (!) 192.8 kg    Height:       Filed Weights   02/09/24 0500 02/10/24 0525 02/11/24 0439  Weight: (!) 206.3 kg (!) 205.4 kg (!) 192.8 kg   Exam: General exam: Appears comfortable  HEENT: oral mucosa moist Respiratory system: Clear to auscultation.  Cardiovascular system: S1 & S2 heard  Gastrointestinal system: Abdomen soft, non-tender, nondistended. Normal bowel sounds   Extremities: No cyanosis, clubbing or edema- right leg is wrapped Psychiatry:  Mood & affect appropriate.      CBC: Recent Labs  Lab 02/05/24 [phone] 02/05/24 1427 02/06/24 0237 02/07/24 0332 02/08/24 9667  WBC 5.7  --  4.0 3.4* 3.3*  HGB 8.9* 8.2* 7.7* 8.0* 8.5*  HCT 31.4* 24.0* 26.3* 26.8* 29.1*  MCV 107.2*  --  105.2* 102.7* 102.5*  PLT 180  --  163 144* 159   Basic Metabolic Panel: Recent Labs  Lab 02/05/24 0929 02/05/24 1427 02/06/24 0237 02/07/24 0332 02/08/24 0332 02/11/24 0534  NA 141 136 144 139 143 143  K 5.3* 5.2* 4.6 4.0 3.6 3.7  CL 99  --  100 98 100 101  CO2 37*  --  37* 33* 34* 33*  GLUCOSE 122*  --  98 86 137* 88  BUN 28*  --  30* 30* 28* 21  CREATININE 1.02  --  1.03 0.93 0.96 0.91  CALCIUM  10.0  --  10.0 9.5 9.7 9.5  MG 2.7*  --  2.3 2.4 2.5*  --   PHOS  --   --  1.5* 2.4* 3.1  --      Scheduled Meds:  apixaban   5 mg Oral BID   arformoterol   15  mcg Nebulization BID   hydrocerin   Topical BID   levothyroxine   125 mcg Oral Q0600   metoprolol  succinate  75 mg Oral Daily   revefenacin   175 mcg Nebulization Daily   torsemide   40 mg Oral Daily    Imaging and lab data personally reviewed   Author: Timmy Bubeck  02/11/2024 11:26 AM  To contact [facility]>   Check the care team in [facility] and look for the attending/consulting [facility] provider listed  Log into [email or web address] and use [place]'s universal password   Go to> [facility]  and find provider  If you still have difficulty reaching the provider, please page the [facility] (Director on Call) for the Hospitalists listed on amion     "

## 2024-02-11 NOTE — Progress Notes (Signed)
 OVERNIGHT STUDY WAS PLACED IN PATIENT'S CHART

## 2024-02-11 NOTE — Progress Notes (Signed)
" °   02/11/24 2310  BiPAP/CPAP/SIPAP  BiPAP/CPAP/SIPAP Pt Type Adult  BiPAP/CPAP/SIPAP SERVO  Mask Type Full face mask  Dentures removed? Not applicable  Set Rate 16 breaths/min  Respiratory Rate 23 breaths/min  IPAP 20 cmH20 (NIV PC 14 above PEEP 6)  EPAP 6 cmH2O  FiO2 (%) 30 %  Minute Ventilation 16.4  Leak 52  Peak Inspiratory Pressure (PIP) 20  Tidal Volume (Vt) 760  Patient Home Machine No  Patient Home Mask No  Patient Home Tubing No  Auto Titrate No  Press High Alarm 30 cmH2O  CPAP/SIPAP surface wiped down Yes  Device Plugged into RED Power Outlet Yes  Oxygen Percent 30 %  BiPAP/CPAP /SiPAP Vitals  Pulse Rate 89  Resp (!) 23  SpO2 95 %  Bilateral Breath Sounds Clear;Diminished  MEWS Score/Color  MEWS Score 1  MEWS Score Color Green    "

## 2024-02-12 DIAGNOSIS — J9602 Acute respiratory failure with hypercapnia: Secondary | ICD-10-CM | POA: Diagnosis not present

## 2024-02-12 DIAGNOSIS — J9601 Acute respiratory failure with hypoxia: Secondary | ICD-10-CM | POA: Diagnosis not present

## 2024-02-12 NOTE — Progress Notes (Signed)
" °   02/12/24 0434  BiPAP/CPAP/SIPAP  BiPAP/CPAP/SIPAP Pt Type Adult (Removed from BiPAP at this time per patient request.)    "

## 2024-02-12 NOTE — Progress Notes (Signed)
 " [facility] Progress Note  Patient: [name]     FMW:[id number]  DOA: 01/31/2024   PCP: Pura Lenis, MD       Brief hospital course: 74 y.o. male with medical history significant for morbid obesity, A-fib on Eliquis , OSA, status post left AKA, hypothyroidism, chronic systolic heart failure and DVT who presented to the ED for evaluation after a fall. Patient reports he lives alone but has a friend that checks on him often. He was attempting to move from his wheelchair to the bed when he fell on the ground. He was unable to get up on his own and laid down for about 8 hours before his friend found him and called EMS. He endorses some recent increase in leg swelling, mild shortness of breath and a chronic back pain.  In ED pulse ox in 90s, RR > 20 and HR > 100 -requited BiPAP. CXR showed vascular congestion.   Subjective:  No new complaints.   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 Alarms and internal battery are required to ensure continuous support and immediate recognition of ventilatory insufficiency. A RAD with backup rate is insufficient due to the severity of illness. Volume ventilation is needed to reduce work of breathing and fatigue and to prevent life threatening exacerbations. OSA is not the predominant cause of hypercapnia. PCO2 = 62 on ABG.  Active Problems:   Acute on chronic systolic CHF (congestive heart failure)  - resolved after diuresis    Fall - out of his wheelchair    Morbid obesity with BMI of 70 and over, adult  Body mass index is 64.43 kg/m.  - will benefit from GLP 1 agonist as outpatient    Intramuscular hematoma - due to fall- Xarelto  resumed    Ulcer of extremity due to chronic venous insufficiency (HCC)  - right leg  - cont wound care and follow  Dispostion waiting on SNF approval with bariatric bed- PT to continue to work with him on mobility while in the hospital.     Code Status: Full Code Total time on patient care: 35 min DVT prophylaxis:   apixaban  (ELIQUIS ) tablet 5 mg     Objective:   Vitals:   02/11/24 2310 02/12/24 0536 02/12/24 0747 02/12/24 0918  BP:  108/65  118/66  Pulse: 89 85  90  Resp: (!) 23 16 16 20   Temp:  97.9 F (36.6 C)  98.8 F (37.1 C)  TempSrc:  Oral  Oral  SpO2: 95% 97% 92% 94%  Weight:      Height:       Filed Weights   02/10/24 0525 02/11/24 0439 02/11/24 1700  Weight: (!) 205.4 kg (!) 192.8 kg (!) 192.2 kg   Exam: General exam: Appears comfortable  HEENT: oral mucosa moist Respiratory system: Clear to auscultation.  Cardiovascular system: S1 & S2 heard  Gastrointestinal system: Abdomen soft, non-tender, nondistended. Normal bowel sounds   Extremities: No cyanosis, clubbing or edema- right leg is wrapped Psychiatry:  Mood & affect appropriate.      CBC: Recent Labs  Lab 02/05/24 1427 02/06/24 0237 02/07/24 0332 02/08/24 0332  WBC  --  4.0 3.4* 3.3*  HGB 8.2* 7.7* 8.0* 8.5*  HCT 24.0* 26.3* 26.8* 29.1*  MCV  --  105.2* 102.7* 102.5*  PLT  --  163 144* 159   Basic Metabolic Panel: Recent Labs  Lab 02/05/24 1427 02/06/24 0237 02/07/24 0332 02/08/24 0332 02/11/24 0534  NA 136 144 139 143 143  K 5.2* 4.6 4.0 3.6 3.7  CL  --  100 98 100 101  CO2  --  37* 33* 34* 33*  GLUCOSE  --  98 86 137* 88  BUN  --  30* 30* 28* 21  CREATININE  --  1.03 0.93 0.96 0.91  CALCIUM   --  10.0 9.5 9.7 9.5  MG  --  2.3 2.4 2.5*  --   PHOS  --  1.5* 2.4* 3.1  --      Scheduled Meds:  apixaban   5 mg Oral BID   arformoterol   15 mcg Nebulization BID   hydrocerin   Topical BID   levothyroxine   125 mcg Oral Q0600   metoprolol  succinate  75 mg Oral Daily   revefenacin   175 mcg Nebulization Daily   torsemide   40 mg Oral Daily    Imaging  and lab data personally reviewed   Author: Ellie Spickler  02/12/2024 10:07 AM  To contact [facility]>   Check the care team in [facility] and look for the attending/consulting TR[facility] provider listed  Log into [email or web address] and use [place]'s universal password   Go to> [facility]  and find provider  If you still have difficulty reaching the provider, please page the [facility] (Director on Call) for the Hospitalists listed on amion     "

## 2024-02-13 DIAGNOSIS — J9601 Acute respiratory failure with hypoxia: Secondary | ICD-10-CM | POA: Diagnosis not present

## 2024-02-13 DIAGNOSIS — J9602 Acute respiratory failure with hypercapnia: Secondary | ICD-10-CM | POA: Diagnosis not present

## 2024-02-13 NOTE — Progress Notes (Signed)
 Physical Therapy Treatment Patient Details Name: [name] MRN: [id number] DOB: 23-Jun-1950 Today's Date: 02/13/2024   History of Present Illness Patient is 73y.o. male admitted from home after a fall from his wheelchair and was down for ~8 hours. Reported some leg swelling and SOB. CT chest, abdomen and pelvis showed intramuscular hematoma in the anterior compartment of R thigh. Other significant PMH includes morbid obesity, L AKA, A-fib, hypothyroidism, GERD, headaches, CHF.    PT Comments  Pt resting in bed, requesting to attempt standing and is agreeable to attempt slideboard transfer per PT request in order to assess if he is able to return home. MAX A +2 to transition to sit at EOB, set up recliner in front of pt with backrest to him for UE support once standing. Pt requires multiple rocks (~10) before comfortable with attempting to stand; x2 sit<>stands for 2-5sec standing bouts at MAX A +2 with PT blocking R knee for additional support. Set up for slide board transfer to his left, education on proper board placement for support and stability. MAX A +3 for board placement and to assist with sliding via draw sheet and VC for proper weight shifts. Able to get halfway to chair before stopping task due to discomfort in his groin that could not be corrected or reposition. Returned to bed at +3 assist, repositioned for comfort once supine. Pt is unable to return home as he was before due to need for +2-3 assist for all mobility, limited strength and endurance below his baseline. PT continues to recommend short term rehab at <3hrs/day for continued skilled therapy intervention at discharge.    If plan is discharge home, recommend the following: Assist for transportation;Two people to help with walking and/or transfers;Two people to help with bathing/dressing/bathroom;Help with stairs or ramp for entrance   Can travel by private vehicle     No  Equipment Recommendations  None recommended by PT     Recommendations for Other Services       Precautions / Restrictions Precautions Precautions: Fall Recall of Precautions/Restrictions: Intact Precaution/Restrictions Comments: watch O2/HR, old L AKA Restrictions Weight Bearing Restrictions Per Provider Order: No     Mobility  Bed Mobility   Bed Mobility: Supine to Sit, Sit to Supine     Supine to sit: Max assist, +2 for physical assistance, HOB elevated, Used rails Sit to supine: Total assist, +2 for physical assistance, Used rails, HOB elevated   General bed mobility comments: improved ability to assist with supine>sit using bedrails, total +2-3 for sit>supine due to fatigue following transfer practice    Transfers Overall transfer level: Needs assistance Equipment used: Sliding board (recliner backrest used for sit<>stands) Transfers: Sit to/from Stand, Bed to chair/wheelchair/BSC Sit to Stand: +2 physical assistance, Max assist, +2 safety/equipment          Lateral/Scoot Transfers: Max assist, +2 physical assistance, With slide board, +2 safety/equipment (+3) General transfer comment: MAX A +2 to complete x2 sit<>stands from edge of bed with LUE suport on bed and RUE on backrest of recliner. PT blocked R knee for support, pt require several rock to gain momentum to help with stand. Attempted slibe board transfer to his left with +3 assist for scooting, SB management and stability of WC; unable to successfully get to [facility] complete due to increased pain and body mechanics    Ambulation/Gait                   Stairs  Wheelchair Mobility     Tilt Bed    Modified Rankin (Stroke Patients Only)       Balance Overall balance assessment: Needs assistance, History of Falls Sitting-balance support: Feet supported, Bilateral upper extremity supported Sitting balance-Leahy Scale: Good Sitting balance - Comments: able to maintain sitting balance at EOB with SPV or CGA for 10+ minutes including  reaching across body   Standing balance support:  (not a functional stander)                                Communication Communication Communication: Impaired Factors Affecting Communication: Hearing impaired  Cognition Arousal: Alert Behavior During Therapy: WFL for tasks assessed/performed   PT - Cognitive impairments: Memory                         Following commands: Impaired Following commands impaired: Follows multi-step commands with increased time    Cueing Cueing Techniques: Verbal cues, Tactile cues  Exercises      General Comments        Pertinent Vitals/Pain Pain Assessment Pain Assessment: Faces Faces Pain Scale: No hurt    Home Living                          Prior Function            PT Goals (current goals can now be found in the care plan section) Acute Rehab PT Goals Patient Stated Goal: get back home by myself PT Goal Formulation: With patient Time For Goal Achievement: 02/16/24 Potential to Achieve Goals: Fair Progress towards PT goals: Progressing toward goals    Frequency    Min 2X/week      PT Plan      Co-evaluation              AM-PAC PT 6 Clicks Mobility   Outcome Measure  Help needed turning from your back to your side while in a flat bed without using bedrails?: Total Help needed moving from lying on your back to sitting on the side of a flat bed without using bedrails?: Total Help needed moving to and from a bed to a chair (including a wheelchair)?: Total Help needed standing up from a chair using your arms (e.g., wheelchair or bedside chair)?: Total Help needed to walk in hospital room?: Total Help needed climbing 3-5 steps with a railing? : Total 6 Click Score: 6    End of Session Equipment Utilized During Treatment: Oxygen;Other (comment) (bari WC, slide board) Activity Tolerance: Patient tolerated treatment well;Patient limited by pain Patient left: in bed;with call  bell/phone within reach;with bed alarm set;with family/visitor present Nurse Communication: Mobility status;Need for lift equipment PT Visit Diagnosis: Muscle weakness (generalized) (M62.81);History of falling (Z91.81);Other abnormalities of gait and mobility (R26.89)     Time: 1413-1500 PT Time Calculation (min) (ACUTE ONLY): 47 min  Charges:    $Therapeutic Activity: 38-52 mins                       Delvon Chipps H. Jahmez Bily, PT, DPT   [employer] 02/13/2024, 3:34 PM

## 2024-02-13 NOTE — TOC Transition Note (Addendum)
 Transition of Care [facility]) - Discharge Note   Patient Details  Name: [name] MRN: [id number] Date of Birth: Apr 17, 1950  [facility] CM/SW Contact:  Bascom Service, RN Phone Number: 02/13/2024, 12:53 PM   Clinical Narrative:  [employer] rep will have loaner status for NIV-Nsg just call [employer]- rep Rosina once [facility] has arrived.[phone]. I will call [facility] once all set up. this is a safe d/c-patient in agreement. Will await 02 sats,home 02 order, & delivery of home 02, & NIV to be delivered to home when [facility] picks patient up.  -3:37p-Noted PT to continue to work w/patient until a safe level for home. No ST SNF able to accommodate, Not appropriate for LTACH.    Final next level of care: Home/Self Care Barriers to Discharge: No Barriers Identified   Patient Goals and CMS Choice Patient states their goals for this hospitalization and ongoing recovery are:: Rehab CMS Medicare.gov Compare Post Acute Care list provided to:: Patient Choice offered to / list presented to : Patient [place] ownership interest in [facility].provided to:: Patient    Discharge Placement                       Discharge Plan and Services Additional resources added to the After Visit Summary for     Discharge Planning Services: CM Consult Post Acute Care Choice: Resumption of Svcs/PTA Provider            DME Agency: [facility], Other - Comment Date DME Agency Contacted: 02/13/24 Time DME Agency Contacted: 1248 Representative spoke with at DME Agency: [employer] rep Rosina            Social Drivers of Health (SDOH) Interventions SDOH Screenings   Food Insecurity: Patient Unable To Answer (02/05/2024)  Housing: Low Risk (02/05/2024)  Transportation Needs: Patient Unable To Answer (02/05/2024)  Utilities: Patient Unable To Answer (02/05/2024)  Financial Resource Strain: Low Risk (04/13/2023)   Received from [facility]  Physical Activity: Unknown (04/13/2023)    [facility] from [facility]  Social Connections: Patient Unable To Answer (02/05/2024)  Stress: Stress Concern Present (04/13/2023)   Received from [facility]  Tobacco Use: Low Risk (01/31/2024)     Readmission Risk Interventions     No data to display

## 2024-02-13 NOTE — Progress Notes (Addendum)
 " [facility] Progress Note  Patient: [name]     FMW:[id number]  DOA: 01/31/2024   PCP: Pura Lenis, MD       Brief hospital course: 74 y.o. male with medical history significant for morbid obesity, A-fib on Eliquis , OSA, status post left AKA, hypothyroidism, chronic systolic heart failure and DVT who presented to the ED for evaluation after a fall. Patient reports he lives alone but has a friend that checks on him often. He was attempting to move from his wheelchair to the bed when he fell on the ground. He was unable to get up on his own and laid down for about 8 hours before his friend found him and called EMS. He endorses some recent increase in leg swelling, mild shortness of breath and a chronic back pain.  In ED pulse ox in 90s, RR > 20 and HR > 100 -requited BiPAP. CXR showed vascular congestion.   Subjective:  States no on helped him get out of bed and he is wanting to go home today.   Assessment and Plan: Principal Problem:   Acute respiratory failure with hypoxia and hypercapnia  - has  improved  - continue at bedtime BiPAP  Mr. 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 Alarms and internal battery are required to ensure continuous support and immediate recognition of ventilatory insufficiency. A RAD with backup rate is insufficient due to the severity of illness. Volume ventilation is needed to reduce work of breathing and fatigue and to prevent life threatening exacerbations. OSA is not the predominant cause of hypercapnia. PCO2 = 62 on ABG. SATURATION QUALIFICATIONS: (This note is used to comply with regulatory documentation for home oxygen)   Patient Saturations on Room Air at Rest = 87%   Patient Saturations on 3 Liters of oxygen at rest = 93%   Please briefly explain why patient needs home oxygen: Unable to ambulate patient. Patient has oxygen saturations of 87% at room  air, at rest while in bed. Placed patient on 3L nasal canula with oxygen saturation of 93% at rest.   Active Problems:   Acute on chronic systolic CHF (congestive heart failure)  - resolved after diuresis    Fall - out of his wheelchair    Morbid obesity with BMI of 70 and over, adult  Body mass index is 68.32 kg/m.  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 He is medically ready to dc as soon as his disposition is decided.     Code Status: Full Code Total time on patient care: 35 min DVT prophylaxis:   apixaban  (ELIQUIS ) tablet 5 mg     Objective:   Vitals:   02/13/24 0306 02/13/24 0435 02/13/24 0500 02/13/24 0758  BP:  114/81    Pulse:  89    Resp: 19 19    Temp:  98.9 F (37.2 C)    TempSrc:      SpO2:  94%  92%  Weight:   (!) 203.8 kg   Height:       Filed Weights   02/11/24 0439 02/11/24 1700 02/13/24 0500  Weight: (!) 192.8 kg (!) 192.2 kg (!) 203.8 kg   Exam: General exam: Appears comfortable  HEENT: oral mucosa moist Respiratory system: Clear to auscultation.  Cardiovascular system: S1 & S2 heard  Gastrointestinal system: Abdomen soft, non-tender, nondistended. Normal bowel sounds   Extremities: No cyanosis, clubbing or edema- right leg is wrapped Psychiatry:  Mood & affect appropriate.      CBC: Recent Labs  Lab 02/07/24 0332 02/08/24 0332  WBC 3.4* 3.3*  HGB 8.0* 8.5*  HCT 26.8* 29.1*  MCV 102.7* 102.5*  PLT 144* 159   Basic Metabolic Panel: Recent Labs  Lab 02/07/24 0332 02/08/24 0332 02/11/24 0534  NA 139 143 143  K 4.0 3.6 3.7  CL 98 100 101  CO2 33* 34* 33*  GLUCOSE 86 137* 88  BUN 30* 28* 21  CREATININE 0.93 0.96 0.91  CALCIUM  9.5  9.7 9.5  MG 2.4 2.5*  --   PHOS 2.4* 3.1  --      Scheduled Meds:  apixaban   5 mg Oral BID   arformoterol   15 mcg Nebulization BID   hydrocerin   Topical BID   levothyroxine   125 mcg Oral Q0600   metoprolol  succinate  75 mg Oral Daily   revefenacin   175 mcg Nebulization Daily   torsemide   40 mg Oral Daily    Imaging and lab data personally reviewed   Author: Namira Rosekrans  02/13/2024 1:10 PM  To contact [facility]>   Check the care team in [facility] and look for the attending/consulting TRH provider listed  Log into [email or web address] and use [employer]'s universal password   Go to> [facility]  and find provider  If you still have difficulty reaching the provider, please page the [facility] (Director on Call) for the Hospitalists listed on amion     "

## 2024-02-13 NOTE — Progress Notes (Addendum)
 SATURATION QUALIFICATIONS: (This note is used to comply with regulatory documentation for home oxygen)  Patient Saturations on Room Air at Rest = 87%  Patient Saturations on 3 Liters of oxygen at rest = 93%  Please briefly explain why patient needs home oxygen: Unable to ambulate patient. Patient has oxygen saturations of 87% at room air, at rest while in bed. Placed patient on 3L nasal canula with oxygen saturation of 93% at rest.

## 2024-02-14 NOTE — Progress Notes (Signed)
" °   02/14/24 0042  BiPAP/CPAP/SIPAP  $ Non-Invasive Ventilator  Non-Invasive Vent Subsequent  BiPAP/CPAP/SIPAP Pt Type Adult  BiPAP/CPAP/SIPAP SERVO  Mask Type Full face mask  Dentures removed? Not applicable  Mask Size Large  Set Rate 16 breaths/min  Respiratory Rate 24 breaths/min  IPAP 20 cmH20  EPAP 6 cmH2O  FiO2 (%) 30 %  Minute Ventilation 11.5  Leak 20  Peak Inspiratory Pressure (PIP) 21  Tidal Volume (Vt) 578  Patient Home Machine No  Patient Home Mask No  Patient Home Tubing No  Auto Titrate No  Press High Alarm 30 cmH2O  Press Low Alarm 5 cmH2O  CPAP/SIPAP surface wiped down Yes  Device Plugged into RED Power Outlet Yes  Oxygen Percent 30 %  BiPAP/CPAP /SiPAP Vitals  Resp (!) 24  MEWS Score/Color  MEWS Score 2  MEWS Score Color Yellow    "

## 2024-02-14 NOTE — Progress Notes (Signed)
 Occupational Therapy Treatment Patient Details Name: [name] MRN: [id number] DOB: 03/10/1950 Today's Date: 02/14/2024   History of present illness Patient is 74y.o. male admitted from home after a fall from his wheelchair and was down for ~8 hours. Reported some leg swelling and SOB. CT chest, abdomen and pelvis showed intramuscular hematoma in the anterior compartment of R thigh. Other significant PMH includes morbid obesity, L AKA, A-fib, hypothyroidism, GERD, headaches, CHF.   OT comments  Patient seen for 2nd visit this day. Progressed w/c to bed via transfer board with patient able to complete post sitting OOB for lunch for ~ 1.5 hrs. Following transfer training, patient able to complete B UE tband therex 3 sets of 10 reps for tricep presses and foam ball squeezes with reinforcement for carryover between visits with + teach back.  Patient requires continued Acute care hospital level OT services to progress safety and functional performance and allow for discharge. Patient will benefit from intensive inpatient follow-up therapy, >3 hours/day.         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 Restrictions Weight Bearing Restrictions Per Provider Order: No       Mobility Bed Mobility Overal bed mobility: Needs Assistance Bed Mobility: Sit to Supine     Supine to sit: Mod assist, +2 for physical assistance, +2 for safety/equipment, HOB elevated, Used rails Sit to supine: Mod assist, +2 for physical assistance, +2 for safety/equipment, HOB elevated, Used rails   General bed mobility comments: cues for hand  placement and use of R LE to boost up the bed as patient typically uses trapeze at home    Transfers Overall transfer level: Needs assistance Equipment used: Sliding board Transfers: Bed to chair/wheelchair/BSC Sit to Stand: Mod assist, +2 physical assistance, +2 safety/equipment (use of recliner handles for STS x 3 with increase ease and tolerance up to 45 sec for pad placement)          Lateral/Scoot Transfers: Mod assist, +2 physical assistance, +2 safety/equipment, With slide board General transfer comment: safety with a 3rd for w/c stability and board placement, went back to bed toward R intact limb     Balance Overall balance assessment: Needs assistance, History of Falls Sitting-balance support: Feet supported, Bilateral upper extremity supported Sitting balance-Leahy Scale: Good     Standing balance support: Bilateral upper extremity supported, During functional activity, Reliant on assistive device for balance Standing balance-Leahy Scale: Poor Standing balance comment: use of back of reliner for stability                           ADL either performed or assessed with clinical judgement   ADL Overall ADL's : Needs assistance/impaired Eating/Feeding: Modified independent;Sitting Eating/Feeding Details (indicate cue type and reason): up in w/c for lunch and reports good tolerance Grooming: Oral care   Upper Body Bathing: Set up   Lower Body Bathing: Maximal assistance;Sitting/lateral leans   Upper Body Dressing : Moderate assistance;Sitting   Lower Body Dressing: Maximal assistance;+2 for physical assistance;+2 for safety/equipment;Sitting/lateral leans;Bed level   Toilet Transfer: +2 for physical assistance;+2 for safety/equipment;BSC/3in1 Toilet Transfer Details (indicate cue type and reason): would need DABDC  Functional mobility during ADLs: +2 for physical assistance;+2 for safety/equipment;Minimal assistance;Moderate assistance;Wheelchair  (Clinical Cytogeneticist) General ADL Comments: set up for oral care post session    Extremity/Trunk Assessment Upper Extremity Assessment Upper Extremity Assessment: Generalized weakness;Right hand dominant   Lower Extremity Assessment Lower Extremity Assessment: Defer to PT evaluation                 Communication Communication Communication: Impaired Factors Affecting Communication: Hearing impaired   Cognition Arousal: Alert Behavior During Therapy: WFL for tasks assessed/performed Cognition: Cognition impaired             OT - Cognition Comments: able to assist with directing care and min cues to problem solve board and w/c placement for back to bed                 Following commands: Impaired Following commands impaired: Follows multi-step commands with increased time      Cueing   Cueing Techniques: Verbal cues, Tactile cues  Exercises Exercises: Other exercises (issued L1 and L2 tband for tricep presses between visits, patient able to complete 3 sets of 10 reps post transfer training with no SOB and sats remaining >94% on 3 ltrs of O2)       General Comments reinforced foam ball and new tband therex for B UE's between visits with + teach back and understanding    Pertinent Vitals/ Pain       Pain Assessment Pain Assessment: Faces Faces Pain Scale: No hurt   Frequency  Min 2X/week        Progress Toward Goals  OT Goals(current goals can now be found in the care plan section)  Progress towards OT goals: Progressing toward goals         AM-PAC OT 6 Clicks Daily Activity     Outcome Measure   Help from another person eating meals?: None Help from another person taking care of personal grooming?: None Help from another person toileting, which includes using toliet, bedpan, or urinal?: A Lot Help from another person bathing (including washing, rinsing, drying)?: A Lot Help from another person to put on and taking off regular upper body  clothing?: A Little Help from another person to put on and taking off regular lower body clothing?: A Lot 6 Click Score: 17    End of Session Equipment Utilized During Treatment: Gait belt;Oxygen  OT Visit Diagnosis: Unsteadiness on feet (R26.81);Other abnormalities of gait and mobility (R26.89);History of falling (Z91.81);Muscle weakness (generalized) (M62.81);Cognitive communication deficit (R41.841);Pain   Activity Tolerance Patient tolerated treatment well   Patient Left with call bell/phone within reach;in bed   Nurse Communication Mobility status        Time: 8682-8654 OT Time Calculation (min): 28 min  Charges: OT General Charges $OT Visit: 1 Visit OT Treatments $Therapeutic Activity: 8-22 mins $Therapeutic Exercise: 8-22 mins  Marvia Troost OT/L Acute Rehabilitation Department  [phone]  02/14/2024, 2:28 PM

## 2024-02-14 NOTE — Progress Notes (Signed)
 " [facility] Progress Note  Patient: [name]     FMW:[id number]  DOA: 01/31/2024   PCP: Pura Lenis, MD       Brief hospital course: 74 y.o. male with medical history significant for morbid obesity, A-fib on Eliquis , OSA, status post left AKA, hypothyroidism, chronic systolic heart failure and DVT who presented to the ED for evaluation after a fall. Patient reports he lives alone but has a friend that checks on him often. He was attempting to move from his wheelchair to the bed when he fell on the ground. He was unable to get up on his own and laid down for about 8 hours before his friend found him and called EMS. He endorses some recent increase in leg swelling, mild shortness of breath and a chronic back pain.  In ED pulse ox in 90s, RR > 20 and HR > 100 -requited BiPAP. CXR showed vascular congestion.   Subjective:  He has no complaints today.   Assessment and Plan: Principal Problem:   Acute respiratory failure with hypoxia and hypercapnia  - has  improved  - continue at bedtime BiPAP - when he goes home, he will be able to have BiPAP supplied by home health along with O2 Mr. 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 Alarms and internal battery are required to ensure continuous support and immediate recognition of ventilatory insufficiency. A RAD with backup rate is insufficient due to the severity of illness. Volume ventilation is needed to reduce work of breathing and fatigue and to prevent life threatening exacerbations. OSA is not the predominant cause of hypercapnia. PCO2 = 62 on ABG. SATURATION QUALIFICATIONS: (This note is used to comply with regulatory documentation for home oxygen)   Patient Saturations on Room Air at Rest = 87%   Patient Saturations on 3 Liters of oxygen at rest = 93%   Please briefly explain why patient needs home oxygen: Unable to ambulate patient.  Patient has oxygen saturations of 87% at room air, at rest while in bed. Placed patient on 3L nasal canula with oxygen saturation of 93% at rest.   Active Problems:   Acute on chronic systolic CHF (congestive heart failure)  - resolved after diuresis    Fall - out of his wheelchair    Morbid obesity with BMI of 70 and over, adult  Body mass index is 68.32 kg/m.  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 He is medically ready to dc as soon as his disposition is decided.     Code Status: Full Code Total time on patient care: 35 min DVT prophylaxis:   apixaban  (ELIQUIS ) tablet 5 mg     Objective:   Vitals:   02/14/24 0558 02/14/24 0744 02/14/24 0745 02/14/24 1313  BP: (!) 102/55   107/77  Pulse: 95   95  Resp: 18   18  Temp: 98.8 F (37.1 C)   98.2 F (36.8 C)  TempSrc: Oral   Oral  SpO2: 96%  93% 93% 93%  Weight:      Height:       Filed Weights   02/13/24 0500 02/13/24 1929 02/14/24 0500  Weight: (!) 203.8 kg (!) 203.8 kg (!) 203.8 kg   Exam: General exam: Appears comfortable  HEENT: oral mucosa moist Respiratory system: Clear to auscultation.  Cardiovascular system: S1 & S2 heard  Gastrointestinal system: Abdomen soft, non-tender, nondistended. Normal bowel sounds   Extremities: No cyanosis, clubbing or edema- right leg is wrapped Psychiatry:  Mood & affect appropriate.   CBC: Recent Labs  Lab 02/08/24 0332  WBC 3.3*  HGB 8.5*  HCT 29.1*  MCV 102.5*  PLT 159   Basic Metabolic Panel: Recent Labs  Lab 02/08/24 0332 02/11/24 0534  NA 143 143  K 3.6 3.7   CL 100 101  CO2 34* 33*  GLUCOSE 137* 88  BUN 28* 21  CREATININE 0.96 0.91  CALCIUM  9.7 9.5  MG 2.5*  --   PHOS 3.1  --      Scheduled Meds:  apixaban   5 mg Oral BID   arformoterol   15 mcg Nebulization BID   hydrocerin   Topical BID   levothyroxine   125 mcg Oral Q0600   metoprolol  succinate  75 mg Oral Daily   revefenacin   175 mcg Nebulization Daily   torsemide   40 mg Oral Daily    Imaging and lab data personally reviewed   Author: Mary Secord  02/14/2024 4:31 PM  To contact [facility]>   Check the care team in [facility] and look for the attending/consulting TR[facility] provider listed  Log into [email or web address] and use [place]'s universal password   Go to> [facility]  and find provider  If you still have difficulty reaching the provider, please page the [facility] (Director on Call) for the Hospitalists listed on amion     "

## 2024-02-14 NOTE — Progress Notes (Signed)
 Inpatient Rehab Admissions Coordinator:   Per updated OT recommendations, pt was screened for CIR by Reche Lowers, PT, DPT.  Note prolonged hospital stay with multiple chronic comorbidities (CHF, respiratory failure with hypercapnia/hypoxia).  Most of this medical problems appear to be stable at this time.  I do not believe based on current payor trends that [facility] Medicare would approve inpatient rehab for his current presentation.    Reche Lowers, PT, DPT Admissions Coordinator [phone] 02/14/2024 4:37 PM

## 2024-02-14 NOTE — TOC Progression Note (Signed)
 Transition of Care [facility]) - Progression Note    Patient Details  Name: [name] MRN: [id number] Date of Birth: 10/25/1950  Transition of Care [facility]) CM/SW Contact  Alayssa Flinchum, Nathanel, RN Phone Number: 02/14/2024, 9:17 AM  Clinical Narrative: Per CIR [facility]-they do take bariatric;must be able to be @ Mod asst, patient currently is max asst;not an appropriate referral.PT to continue to work w/patient until an appropriate d/c plan for home.      Expected Discharge Plan: Home/Self Care Barriers to Discharge: Continued Medical Work up               Expected Discharge Plan and Services   Discharge Planning Services: CM Consult Post Acute Care Choice: Resumption of Svcs/PTA Provider Living arrangements for the past 2 months: Apartment                   DME Agency: [employer], Other - Comment Date DME Agency Contacted: 02/13/24 Time DME Agency Contacted: 1248 Representative spoke with at DME Agency: [employer] rep Rosina             Social Drivers of Health (SDOH) Interventions SDOH Screenings   Food Insecurity: Patient Unable To Answer (02/05/2024)  Housing: Low Risk (02/05/2024)  Transportation Needs: Patient Unable To Answer (02/05/2024)  Utilities: Patient Unable To Answer (02/05/2024)  Financial Resource Strain: Low Risk (04/13/2023)   Received from [facility]  Physical Activity: Unknown (04/13/2023)   Received from [facility]  Social Connections: Patient Unable To Answer (02/05/2024)  Stress: Stress Concern Present (04/13/2023)   Received from [facility]  Tobacco Use: Low Risk (01/31/2024)    Readmission Risk Interventions     No data to display

## 2024-02-14 NOTE — Progress Notes (Signed)
" °   02/14/24 2109  BiPAP/CPAP/SIPAP  BiPAP/CPAP/SIPAP Pt Type Adult  BiPAP/CPAP/SIPAP SERVO  Mask Type Full face mask  Dentures removed? Not applicable  Mask Size Large  Set Rate 16 breaths/min  Respiratory Rate 18 breaths/min  IPAP 20 cmH20  EPAP 6 cmH2O  FiO2 (%) 30 %  Minute Ventilation 12.1  Leak 26  Peak Inspiratory Pressure (PIP) 20  Tidal Volume (Vt) 599  Patient Home Machine No  Patient Home Mask No  Patient Home Tubing No  Auto Titrate No  Press High Alarm 30 cmH2O  Press Low Alarm 5 cmH2O  CPAP/SIPAP surface wiped down Yes  Device Plugged into RED Power Outlet Yes  Oxygen Percent 30 %  BiPAP/CPAP /SiPAP Vitals  Resp 18  SpO2 95 %  Bilateral Breath Sounds Clear  MEWS Score/Color  MEWS Score 0  MEWS Score Color Green    "

## 2024-02-14 NOTE — Progress Notes (Addendum)
 Occupational Therapy Treatment Patient Details Name: [name] MRN: [id number] DOB: 01-10-1951 Today's Date: 02/14/2024   History of present illness Patient is 73y.o. male admitted from home after a fall from his wheelchair and was down for ~8 hours. Reported some leg swelling and SOB. CT chest, abdomen and pelvis showed intramuscular hematoma in the anterior compartment of R thigh. Other significant PMH includes morbid obesity, L AKA, A-fib, hypothyroidism, GERD, headaches, CHF.   OT comments  Patient seen for am visit for skilled OT session. Motivated this day for OOB to bariatric manual w/c via transfer board. Progressed with board placement from sitting EOB with much improved tolerance and sitting balance. Increased confident and skill with personal transfer board brought from home. Patient moved from bed to w/c toward residual limb side then performed several STS as patient decided to remain up in w/c for lunch meal and for safety a maxi move sling was placed under. VSS on 3 ltrs O2 via [place] and no pain reported throughout session. See below for current status. Based on significant increased performance and tolerance and drive for improvement, patient will benefit from intensive inpatient follow-up therapy, >3 hours/day. Patient requires continued Acute care hospital level OT services to progress safety and functional performance and allow for discharge.         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 Restrictions Weight Bearing Restrictions Per Provider Order: No       Mobility Bed Mobility Overal bed mobility:  Needs Assistance Bed Mobility: Supine to Sit, Sit to Supine     Supine to sit: Mod assist, +2 for physical assistance, +2 for safety/equipment, HOB elevated, Used rails    General bed mobility comments: cues for hand placement and use of rails and bed features     Transfers Overall transfer level: Needs assistance Equipment used: Sliding board Transfers: Sit to/from Stand, Bed to chair/wheelchair/BSC Sit to Stand: Mod assist, +2 physical assistance, +2 safety/equipment (use of recliner handles for STS x 3 with increase ease and tolerance up to 45 sec for pad placement)          Lateral/Scoot Transfers: Mod assist, Min assist, +2 physical assistance, +2 safety/equipment, With slide board General transfer comment: safety with a 3rd for w/c stability and board placement, went OOB to residual limb side    Balance Overall balance assessment: Needs assistance, History of Falls Sitting-balance support: Feet supported, Bilateral upper extremity supported Sitting balance-Leahy Scale: Good     Standing balance support: Bilateral upper extremity supported, During functional activity, Reliant on assistive device for balance Standing balance-Leahy Scale: Poor Standing balance comment: use of back of reliner for stability                           ADL either performed or assessed with clinical judgement   ADL Overall ADL's : Needs assistance/impaired Eating/Feeding: Modified independent;Sitting Eating/Feeding Details (indicate cue type and reason): up in w/c for lunch Grooming: Wash/dry hands;Wash/dry face;Oral care;Contact guard assist;Sitting   Upper Body Bathing: Minimal assistance;Sitting   Lower Body Bathing: Maximal assistance;Sitting/lateral leans   Upper Body Dressing : Moderate assistance;Sitting   Lower Body Dressing: Maximal assistance;+2 for physical assistance;+2  for safety/equipment;Sitting/lateral leans;Bed level   Toilet Transfer: +2 for physical  assistance;+2 for safety/equipment;BSC/3in1 Toilet Transfer Details (indicate cue type and reason): would need DABDC         Functional mobility during ADLs: +2 for physical assistance;+2 for safety/equipment;Minimal assistance;Moderate assistance;Wheelchair (Clinical Cytogeneticist) General ADL Comments: significant increased STS and use of TB to and from w/c and bed    Extremity/Trunk Assessment Upper Extremity Assessment Upper Extremity Assessment: Generalized weakness;Right hand dominant   Lower Extremity Assessment Lower Extremity Assessment: Defer to PT evaluation                 Communication Communication Communication: Impaired Factors Affecting Communication: Hearing impaired   Cognition Arousal: Alert Behavior During Therapy: WFL for tasks assessed/performed Cognition: Cognition impaired             OT - Cognition Comments: improved overall processing, improved problem solving for directing care                 Following commands: Impaired Following commands impaired: Follows multi-step commands with increased time      Cueing   Cueing Techniques: Verbal cues, Tactile cues        General Comments min cues for breathing, BP 121/71 post session with spO2 94% on 3 ltrs    Pertinent Vitals/ Pain       Pain Assessment Pain Assessment: Faces Faces Pain Scale: No hurt   Frequency  Min 2X/week        Progress Toward Goals  OT Goals(current goals can now be found in the care plan section)  Progress towards OT goals: Progressing toward goals      AM-PAC OT 6 Clicks Daily Activity     Outcome Measure   Help from another person eating meals?: None Help from another person taking care of personal grooming?: None Help from another person toileting, which includes using toliet, bedpan, or urinal?: A Lot Help from another person bathing (including washing, rinsing, drying)?: A Lot Help from another person to put on and taking off regular  upper body clothing?: A Little Help from another person to put on and taking off regular lower body clothing?: A Lot 6 Click Score: 17    End of Session Equipment Utilized During Treatment: Gait belt;Oxygen  OT Visit Diagnosis: Unsteadiness on feet (R26.81);Other abnormalities of gait and mobility (R26.89);History of falling (Z91.81);Muscle weakness (generalized) (M62.81);Cognitive communication deficit (R41.841);Pain   Activity Tolerance Patient tolerated treatment well   Patient Left in chair;with call bell/phone within reach   Nurse Communication Mobility status;Other (comment)        Time: 1120-1200 OT Time Calculation (min): 40 min  Charges: OT General Charges $OT Visit: 1 Visit OT Treatments $Therapeutic Activity: 23-37 mins  Arryn Terrones OT/L Acute Rehabilitation Department  [phone]  02/14/2024, 2:09 PM

## 2024-02-15 DIAGNOSIS — J9602 Acute respiratory failure with hypercapnia: Secondary | ICD-10-CM | POA: Diagnosis not present

## 2024-02-15 DIAGNOSIS — J9601 Acute respiratory failure with hypoxia: Secondary | ICD-10-CM | POA: Diagnosis not present

## 2024-02-15 NOTE — TOC Progression Note (Addendum)
 Transition of Care [facility]) - Progression Note    Patient Details  Name: [name] MRN: [id number] Date of Birth: 1950-11-07  Transition [facility]) CM/SW Contact  Libia Fazzini, Nathanel, RN Phone Number: 02/15/2024, 9:00 AM  Clinical Narrative:Patient for review by [facility](CIR) w/s rep Delon today-await outcome. Also LVM w/[facility] ST [facility] rep Emily/Robbie-accepted- await call back.   -9a-[facility] called back no bed available.;Rep Tammy w/[facility] to review. -12p-Patient agreed to [facility] CIR rep Delon accepted-Jennifer initiated [employer].    Expected Discharge Plan: IP Rehab Facility Barriers to Discharge: Continued Medical Work up               Expected Discharge Plan and Services   Discharge Planning Services: CM Consult Post Acute Care Choice: IP Rehab Living arrangements for the past 2 months: Single Family Home                   DME Agency: [employer], Other - Comment Date DME Agency Contacted: 02/13/24 Time DME Agency Contacted: 1248 Representative spoke with at DME Agency: [employer] rep Rosina             Social Drivers of Health (SDOH) Interventions SDOH Screenings   Food Insecurity: Patient Unable To Answer (02/05/2024)  Housing: Low Risk (02/05/2024)  Transportation Needs: Patient Unable To Answer (02/05/2024)  Utilities: Patient Unable To Answer (02/05/2024)  Financial Resource Strain: Low Risk (04/13/2023)   Received from [facility]  Physical Activity: Unknown (04/13/2023)   Received from [facility]  Social Connections: Patient Unable To Answer (02/05/2024)  Stress: Stress Concern Present (04/13/2023)   Received from [facility]  Tobacco Use: Low Risk (01/31/2024)    Readmission Risk Interventions     No data to display

## 2024-02-15 NOTE — Plan of Care (Signed)

## 2024-02-15 NOTE — Progress Notes (Addendum)
 " PROGRESS NOTE    [name]  FMW:[id number] DOB: 08-Mar-1950 DOA: 01/31/2024 PCP: Pura Lenis, MD   Brief Narrative:  74 y.o. male with medical history significant for morbid obesity, A-fib on Eliquis , OSA, status post left AKA, hypothyroidism, chronic systolic heart failure and DVT presented after a fall.  On presentation, he was hypoxic with chest x-ray showing vascular congestion and required BiPAP and diuresis.  He was also found to have intramuscular hematoma in the anterior compartment of the proximal left thigh.     Assessment & Plan:   Acute respiratory failure with hypoxia and hypercapnia - Continue at bedtime BiPAP even on discharge.  Currently on 4 L oxygen via nasal cannula. -PCCM has already signed off.  Outpatient follow-up with CCM.  Continue current nebs.  Acute on chronic systolic CHF - Currently compensated.  Continue torsemide , metoprolol  succinate.  Strict input and output.  Daily weights.  Fluid restriction.  Paroxysmal A-fib - Rate mostly controlled.  Continue Eliquis  and metoprolol  succinate  Fall - Out of his wheelchair. -He is currently medically stable for discharge but awaiting safe disposition.  TOC following.  Obesity class III - Outpatient follow-up  Intramuscular hematoma in the anterior compartment of the proximal left thigh - Due to fall.  Xarelto  has already been resumed  Ulcer of extremity due to chronic venous insufficiency of right leg - Continue local wound care.  Hypophosphatemia - Resolved  Thrombocytopenia - Resolved  Leukopenia - No recent labs  Anemia of chronic disease Macrocytosis - From chronic illnesses.  Hemoglobin stable.  Monitor intermittently   DVT prophylaxis: Eliquis  Code Status: Full Family Communication: None at bedside Disposition Plan: Status is: Inpatient Remains inpatient appropriate because: Of severity of illness    Consultants: PCCM  Procedures: As above  Antimicrobials: None  currently   Subjective: Patient seen and examined at bedside.  Denies worsening shortness of breath, fever or vomiting.  Objective: Vitals:   02/15/24 0431 02/15/24 0859 02/15/24 0900 02/15/24 1012  BP: 110/65   97/62  Pulse: (!) 50   93  Resp: 18  16   Temp: 98.3 F (36.8 C)     TempSrc: Oral     SpO2: 93% 91%    Weight:      Height:        Intake/Output Summary (Last 24 hours) at 02/15/2024 1235 Last data filed at 02/15/2024 0749 Gross per 24 hour  Intake 360 ml  Output 1700 ml  Net -1340 ml   Filed Weights   02/13/24 1929 02/14/24 0500 02/15/24 0359  Weight: (!) 203.8 kg (!) 203.8 kg (!) 201.9 kg    Examination:  General exam: Appears calm and comfortable.  Chronically ill and deconditioned. Respiratory system: Bilateral decreased breath sounds at bases Cardiovascular system: S1 & S2 heard, Rate controlled Gastrointestinal system: Abdomen is nondistended, soft and nontender. Normal bowel sounds heard. Extremities: Left AKA present Data Reviewed: I have personally reviewed following labs and imaging studies  CBC: No results for input(s): WBC, NEUTROABS, HGB, HCT, MCV, PLT in the last 168 hours. Basic Metabolic Panel: Recent Labs  Lab 02/11/24 0534  NA 143  K 3.7  CL 101  CO2 33*  GLUCOSE 88  BUN 21  CREATININE 0.91  CALCIUM  9.5   GFR: Estimated Creatinine Clearance: 124.6 mL/min (by C-G formula based on SCr of 0.91 mg/dL). Liver Function Tests: No results for input(s): AST, ALT, ALKPHOS, BILITOT, PROT, ALBUMIN  in the last 168 hours. No results for input(s): LIPASE, AMYLASE in  the last 168 hours. No results for input(s): AMMONIA in the last 168 hours. Coagulation Profile: No results for input(s): INR, PROTIME in the last 168 hours. Cardiac Enzymes: No results for input(s): CKTOTAL, CKMB, CKMBINDEX, TROPONINI in the last 168 hours. BNP (last 3 results) Recent Labs    01/31/24 1436  PROBNP 1,218.0*    HbA1C: No results for input(s): HGBA1C in the last 72 hours. CBG: Recent Labs  Lab 02/08/24 2126  GLUCAP 111*   Lipid Profile: No results for input(s): CHOL, HDL, LDLCALC, TRIG, CHOLHDL, LDLDIRECT in the last 72 hours. Thyroid  Function Tests: No results for input(s): TSH, T4TOTAL, FREET4, T3FREE, THYROIDAB in the last 72 hours. Anemia Panel: No results for input(s): VITAMINB12, FOLATE, FERRITIN, TIBC, IRON, RETICCTPCT in the last 72 hours. Sepsis Labs: No results for input(s): PROCALCITON, LATICACIDVEN in the last 168 hours.  Recent Results (from the past 240 hours)  MRSA Next Gen by PCR, Nasal     Status: None   Collection Time: 02/05/24  2:18 PM   Specimen: Nasal Mucosa; Nasal Swab  Result Value Ref Range Status   MRSA by PCR Next Gen NOT DETECTED NOT DETECTED Final    Comment: (NOTE) The GeneXpert MRSA Assay (FDA approved for NASAL specimens only), is one component of a comprehensive MRSA colonization surveillance program. It is not intended to diagnose MRSA infection nor to guide or monitor treatment for MRSA infections. Test performance is not FDA approved in patients less than 7 years old. Performed at [facility], [address] [place], [place] [address]   Respiratory (~20 pathogens) panel by PCR     Status: None   Collection Time: 02/06/24  8:40 AM   Specimen: Nasopharyngeal Swab; Respiratory  Result Value Ref Range Status   Adenovirus NOT DETECTED NOT DETECTED Final   Coronavirus 229E NOT DETECTED NOT DETECTED Final    Comment: (NOTE) The Coronavirus on the Respiratory Panel, DOES NOT test for the novel  Coronavirus (2019 nCoV)    Coronavirus HKU1 NOT DETECTED NOT DETECTED Final   Coronavirus NL63 NOT DETECTED NOT DETECTED Final   Coronavirus OC43 NOT DETECTED NOT DETECTED Final   Metapneumovirus NOT DETECTED NOT DETECTED Final   Rhinovirus / Enterovirus NOT DETECTED NOT DETECTED Final    Influenza A NOT DETECTED NOT DETECTED Final   Influenza B NOT DETECTED NOT DETECTED Final   Parainfluenza Virus 1 NOT DETECTED NOT DETECTED Final   Parainfluenza Virus 2 NOT DETECTED NOT DETECTED Final   Parainfluenza Virus 3 NOT DETECTED NOT DETECTED Final   Parainfluenza Virus 4 NOT DETECTED NOT DETECTED Final   Respiratory Syncytial Virus NOT DETECTED NOT DETECTED Final   Bordetella pertussis NOT DETECTED NOT DETECTED Final   Bordetella Parapertussis NOT DETECTED NOT DETECTED Final   Chlamydophila pneumoniae NOT DETECTED NOT DETECTED Final   Mycoplasma pneumoniae NOT DETECTED NOT DETECTED Final    Comment: Performed at [facility], [address] [place], [place] [address]         Radiology Studies: No results found.      Scheduled Meds:  apixaban   5 mg Oral BID   arformoterol   15 mcg Nebulization BID   hydrocerin   Topical BID   levothyroxine   125 mcg Oral Q0600   metoprolol  succinate  75 mg Oral Daily   revefenacin   175 mcg Nebulization Daily   torsemide   40 mg Oral Daily   Continuous Infusions:        Sophie Mao, MD [facility] 02/15/2024,  12:35 PM   "

## 2024-02-16 ENCOUNTER — Encounter (HOSPITAL_BASED_OUTPATIENT_CLINIC_OR_DEPARTMENT_OTHER): Admitting: Internal Medicine

## 2024-02-16 DIAGNOSIS — J9602 Acute respiratory failure with hypercapnia: Secondary | ICD-10-CM | POA: Diagnosis not present

## 2024-02-16 DIAGNOSIS — J9601 Acute respiratory failure with hypoxia: Secondary | ICD-10-CM | POA: Diagnosis not present

## 2024-02-16 NOTE — TOC Progression Note (Signed)
 Transition of Care [facility]) - Progression Note    Patient Details  Name: [name] MRN: [id number] Date of Birth: 1950/06/07  Transition of Care [facility]) CM/SW Contact  Tammela Bales, Nathanel, RN Phone Number: 02/16/2024, 12:33 PM  Clinical Narrative: [facility]-CIR rep Delon awaiting auth;[employer] rep Ashley-has received auth, has device NIV, & 02 to deliver to facility once auth confirmed. PTAR @ d/c.      Expected Discharge Plan: IP Rehab Facility Barriers to Discharge: Continued Medical Work up               Expected Discharge Plan and Services   Discharge Planning Services: CM Consult Post Acute Care Choice: IP Rehab Living arrangements for the past 2 months: Single Family Home                 DME Arranged: Oxygen, NIV DME Agency: Other - Comment Donalee) Date DME Agency Contacted: 02/16/24 Time DME Agency Contacted: 1232 Representative spoke with at DME Agency: Rosina             Social Drivers of Health (SDOH) Interventions SDOH Screenings   Food Insecurity: Patient Unable To Answer (02/05/2024)  Housing: Low Risk (02/05/2024)  Transportation Needs: Patient Unable To Answer (02/05/2024)  Utilities: Patient Unable To Answer (02/05/2024)  Financial Resource Strain: Low Risk (04/13/2023)   Received from [facility]  Physical Activity: Unknown (04/13/2023)   Received from [facility]  Social Connections: Patient Unable To Answer (02/05/2024)  Stress: Stress Concern Present (04/13/2023)   Received from [facility]  Tobacco Use: Low Risk (01/31/2024)    Readmission Risk Interventions     No data to display

## 2024-02-16 NOTE — Progress Notes (Signed)
 " PROGRESS NOTE    [name]  FMW:[id number] DOB: [birth year] DOA: 01/31/2024 PCP: Pura Lenis, MD   Brief Narrative:  74 y.o. male with medical history significant for morbid obesity, A-fib on Eliquis , OSA, status post left AKA, hypothyroidism, chronic systolic heart failure and DVT presented after a fall.  On presentation, he was hypoxic with chest x-ray showing vascular congestion and required BiPAP and diuresis.  He was also found to have intramuscular hematoma in the anterior compartment of the proximal left thigh.  Subsequently, his respiratory status has improved.  PCCM has signed off.  PT recommended SNF placement.  He is currently medically stable for discharge but awaiting safe disposition.  TOC following.  Assessment & Plan:   Acute respiratory failure with hypoxia and hypercapnia - Continue at bedtime BiPAP even on discharge.  Currently on 3-4 L oxygen via nasal cannula. -PCCM has already signed off.  Outpatient follow-up with CCM.  Continue current nebs.  Acute on chronic systolic CHF - Currently compensated.  Continue torsemide , metoprolol  succinate.  Strict input and output.  Daily weights.  Fluid restriction.  Paroxysmal A-fib - Rate mostly controlled.  Continue Eliquis  and metoprolol  succinate  Fall - Out of his wheelchair. -He is currently medically stable for discharge but awaiting safe disposition.  TOC following.  Obesity class III - Outpatient follow-up  Intramuscular hematoma in the anterior compartment of the proximal left thigh - Due to fall.  Xarelto  has already been resumed  Ulcer of extremity due to chronic venous insufficiency of right leg - Continue local wound care.  Hypophosphatemia - Resolved  Thrombocytopenia - Resolved  Leukopenia - No recent labs  Anemia of chronic disease Macrocytosis - From chronic illnesses.  Hemoglobin stable.  Monitor intermittently   DVT prophylaxis: Eliquis  Code Status: Full Family Communication:  None at bedside Disposition Plan: Status is: Inpatient Remains inpatient appropriate because: Of severity of illness    Consultants: PCCM  Procedures: As above  Antimicrobials: None currently   Subjective: Patient seen and examined at bedside.  No fever, vomiting or chest pain reported. Objective: Vitals:   02/15/24 1322 02/15/24 2033 02/16/24 0514 02/16/24 0800  BP: 107/68 123/74 97/61   Pulse: 84 82 92   Resp: 18 19 16    Temp: 98.5 F (36.9 C) 98.1 F (36.7 C) 97.8 F (36.6 C)   TempSrc: Oral Oral Oral   SpO2: 93% 93% 95% 93%  Weight:      Height:        Intake/Output Summary (Last 24 hours) at 02/16/2024 0804 Last data filed at 02/16/2024 0100 Gross per 24 hour  Intake 240 ml  Output 1850 ml  Net -1610 ml   Filed Weights   02/13/24 1929 02/14/24 0500 02/15/24 0359  Weight: (!) 203.8 kg (!) 203.8 kg (!) 201.9 kg    Examination:  General: On 3 L oxygen by nasal cannula.  No distress.  respiratory: Decreased breath sounds at bases bilaterally with some crackles CVS: Currently rate controlled; S1-S2 heard  abdominal: Soft, morbidly obese, nontender, slightly distended, no organomegaly; bowel sounds are heard  extremities: Has left AKA.    Data Reviewed: I have personally reviewed following labs and imaging studies  CBC: No results for input(s): WBC, NEUTROABS, HGB, HCT, MCV, PLT in the last 168 hours. Basic Metabolic Panel: Recent Labs  Lab 02/11/24 0534  NA 143  K 3.7  CL 101  CO2 33*  GLUCOSE 88  BUN 21  CREATININE 0.91  CALCIUM  9.5   GFR:  Estimated Creatinine Clearance: 124.6 mL/min (by C-G formula based on SCr of 0.91 mg/dL). Liver Function Tests: No results for input(s): AST, ALT, ALKPHOS, BILITOT, PROT, ALBUMIN  in the last 168 hours. No results for input(s): LIPASE, AMYLASE in the last 168 hours. No results for input(s): AMMONIA in the last 168 hours. Coagulation Profile: No results for input(s): INR,  PROTIME in the last 168 hours. Cardiac Enzymes: No results for input(s): CKTOTAL, CKMB, CKMBINDEX, TROPONINI in the last 168 hours. BNP (last 3 results) Recent Labs    01/31/24 1436  PROBNP 1,218.0*   HbA1C: No results for input(s): HGBA1C in the last 72 hours. CBG: No results for input(s): GLUCAP in the last 168 hours.  Lipid Profile: No results for input(s): CHOL, HDL, LDLCALC, TRIG, CHOLHDL, LDLDIRECT in the last 72 hours. Thyroid  Function Tests: No results for input(s): TSH, T4TOTAL, FREET4, T3FREE, THYROIDAB in the last 72 hours. Anemia Panel: No results for input(s): VITAMINB12, FOLATE, FERRITIN, TIBC, IRON, RETICCTPCT in the last 72 hours. Sepsis Labs: No results for input(s): PROCALCITON, LATICACIDVEN in the last 168 hours.  Recent Results (from the past 240 hours)  Respiratory (~20 pathogens) panel by PCR     Status: None   Collection Time: 02/06/24  8:40 AM   Specimen: Nasopharyngeal Swab; Respiratory  Result Value Ref Range Status   Adenovirus NOT DETECTED NOT DETECTED Final   Coronavirus 229E NOT DETECTED NOT DETECTED Final    Comment: (NOTE) The Coronavirus on the Respiratory Panel, DOES NOT test for the novel  Coronavirus (2019 nCoV)    Coronavirus HKU1 NOT DETECTED NOT DETECTED Final   Coronavirus NL63 NOT DETECTED NOT DETECTED Final   Coronavirus OC43 NOT DETECTED NOT DETECTED Final   Metapneumovirus NOT DETECTED NOT DETECTED Final   Rhinovirus / Enterovirus NOT DETECTED NOT DETECTED Final   Influenza A NOT DETECTED NOT DETECTED Final   Influenza B NOT DETECTED NOT DETECTED Final   Parainfluenza Virus 1 NOT DETECTED NOT DETECTED Final   Parainfluenza Virus 2 NOT DETECTED NOT DETECTED Final   Parainfluenza Virus 3 NOT DETECTED NOT DETECTED Final   Parainfluenza Virus 4 NOT DETECTED NOT DETECTED Final   Respiratory Syncytial Virus NOT DETECTED NOT DETECTED Final   Bordetella pertussis NOT DETECTED NOT  DETECTED Final   Bordetella Parapertussis NOT DETECTED NOT DETECTED Final   Chlamydophila pneumoniae NOT DETECTED NOT DETECTED Final   Mycoplasma pneumoniae NOT DETECTED NOT DETECTED Final    Comment: Performed at [facility], [address] [place], [place] [address]         Radiology Studies: No results found.      Scheduled Meds:  apixaban   5 mg Oral BID   arformoterol   15 mcg Nebulization BID   hydrocerin   Topical BID   levothyroxine   125 mcg Oral Q0600   metoprolol  succinate  75 mg Oral Daily   revefenacin   175 mcg Nebulization Daily   torsemide   40 mg Oral Daily   Continuous Infusions:        Sophie Mao, MD [facility] 02/16/2024, 8:04 AM   "

## 2024-02-16 NOTE — Plan of Care (Signed)

## 2024-02-16 NOTE — Progress Notes (Signed)
" °   02/16/24 0138  BiPAP/CPAP/SIPAP  BiPAP/CPAP/SIPAP Pt Type Adult  BiPAP/CPAP/SIPAP SERVO  Mask Type Full face mask  Mask Size Large  Set Rate 16 breaths/min  Respiratory Rate 29 breaths/min  IPAP 20 cmH20  EPAP 6 cmH2O  Pressure Support 14 cmH20  PEEP 6 cmH20  FiO2 (%) 30 %  Minute Ventilation 1538  Leak 70  Peak Inspiratory Pressure (PIP) 20  Tidal Volume (Vt) 736  Patient Home Machine No  Patient Home Mask No  Patient Home Tubing No  Auto Titrate No  Press High Alarm 30 cmH2O  Press Low Alarm 5 cmH2O  Device Plugged into RED Power Outlet Yes  Oxygen Percent 30 %    "

## 2024-02-17 DIAGNOSIS — J9602 Acute respiratory failure with hypercapnia: Secondary | ICD-10-CM | POA: Diagnosis not present

## 2024-02-17 DIAGNOSIS — J9601 Acute respiratory failure with hypoxia: Secondary | ICD-10-CM | POA: Diagnosis not present

## 2024-02-17 NOTE — Plan of Care (Signed)
Discussed with patient plan of care for the evening, pain management and medications with some teach back displayed  Problem: Education: Goal: Knowledge of General Education information will improve Description: Including pain rating scale, medication(s)/side effects and non-pharmacologic comfort measures Outcome: Progressing   Problem: Health Behavior/Discharge Planning: Goal: Ability to manage health-related needs will improve Outcome: Progressing   

## 2024-02-17 NOTE — Progress Notes (Signed)
" °   02/17/24 2240  BiPAP/CPAP/SIPAP  BiPAP/CPAP/SIPAP Pt Type Adult  BiPAP/CPAP/SIPAP SERVO  Mask Type Full face mask  Dentures removed? Not applicable  Mask Size Medium  Set Rate 16 breaths/min  Respiratory Rate 22 breaths/min  IPAP 18 cmH20  EPAP 6 cmH2O  Pressure Support 12 cmH20  PEEP 6 cmH20  FiO2 (%) 30 %  Minute Ventilation 9.3  Leak 63  Peak Inspiratory Pressure (PIP) 19  Tidal Volume (Vt) 463  Patient Home Machine No  Patient Home Mask No  Patient Home Tubing No  Auto Titrate No  Press High Alarm 30 cmH2O  Nasal massage performed Yes  Device Plugged into RED Power Outlet Yes  Oxygen Percent 30 %  BiPAP/CPAP /SiPAP Vitals  Pulse Rate 87  Resp (!) 22  SpO2 93 %  Bilateral Breath Sounds Clear;Diminished    "

## 2024-02-17 NOTE — Progress Notes (Signed)
 Physical Therapy Treatment Patient Details Name: [name] MRN: [id number] DOB: [birth year] Today's Date: 02/17/2024   History of Present Illness Patient is 73y.o. male admitted from home after a fall from his wheelchair and was down for ~8 hours. Reported some leg swelling and SOB. CT chest, abdomen and pelvis showed intramuscular hematoma in the anterior compartment of R thigh. Other significant PMH includes morbid obesity, L AKA, A-fib, hypothyroidism, GERD, headaches, CHF.    PT Comments  Called to room by Nursing/NT to assist Pt back to bed.  Pt had called out requesting to go back to bed.  MAXI LIFT was under Pt in wheelchair however had shifted and right lower strap was difficult to position.  Assisted Pt with weight shifting and increased effort to properly secure straps in seated position.  Extra caution to secure LEFT LE strap/support under L AKA.  Assisted out of wheelchair + 2 assist back to bed via MAXI SKY LIFT.  Assisted with side to side rolling using bed rails to remove LIFT pad.  Positioned Pt to comfort. Pt requesting Tylenol . Prior to admit, Pt was home alone and self able to transfer to his wheelchair.  LPT has rec [facility] to address decline in mobility.  Pt highly motivated.      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 Restrictions Weight Bearing Restrictions Per Provider Order: No     Mobility  Bed Mobility Overal bed mobility: Needs Assistance Bed Mobility: Supine to Sit       Sit to supine: Mod assist, +2 for physical assistance,  +2 for safety/equipment, HOB elevated, Used rails   General bed mobility comments: cues for hand placement and use of R LE to boost up the bed as patient typically uses trapeze at home    Transfers Overall transfer level: Needs assistance Equipment used: Sliding board Transfers: Bed to chair/wheelchair/BSC Sit to Stand: Mod assist, +2 physical assistance, +2 safety/equipment, Max assist          Lateral/Scoot Transfers: Mod assist, +2 physical assistance, +2 safety/equipment, With slide board General transfer comment: used MAXI SKY LIFT from wheelchair back to bed + 2 assist Transfer via Lift Equipment: [place]  Ambulation/Gait               General Gait Details: non amb   Stairs             Wheelchair Mobility     Tilt Bed    Modified Rankin (Stroke Patients Only)       Balance                                            Communication Communication Communication: No apparent difficulties Factors Affecting Communication: Hearing impaired  Cognition Arousal: Alert Behavior During Therapy: WFL for tasks assessed/performed   PT - Cognitive impairments: No apparent impairments                       PT - Cognition  Comments: having some difficulty recalling how he transferred from bed to Wc Following commands: Impaired Following commands impaired: Follows multi-step commands with increased time    Cueing Cueing Techniques: Verbal cues  Exercises      General Comments        Pertinent Vitals/Pain Pain Assessment Pain Assessment: No/denies pain    Home Living                          Prior Function            PT Goals (current goals can now be found in the care plan section) Progress towards PT goals: Progressing toward goals    Frequency    Min 2X/week      PT Plan      Co-evaluation PT/OT/SLP Co-Evaluation/Treatment: Yes   PT goals addressed during session: Mobility/safety with  mobility;Balance;Proper use of DME;Strengthening/ROM OT goals addressed during session: ADL's and self-care;Proper use of Adaptive equipment and DME;Strengthening/ROM      AM-PAC PT 6 Clicks Mobility   Outcome Measure  Help needed turning from your back to your side while in a flat bed without using bedrails?: Total Help needed moving from lying on your back to sitting on the side of a flat bed without using bedrails?: Total Help needed moving to and from a bed to a chair (including a wheelchair)?: Total Help needed standing up from a chair using your arms (e.g., wheelchair or bedside chair)?: Total Help needed to walk in hospital room?: Total Help needed climbing 3-5 steps with a railing? : Total 6 Click Score: 6    End of Session Equipment Utilized During Treatment: Oxygen;Other (comment) Activity Tolerance: Patient tolerated treatment well Patient left: in chair;with call bell/phone within reach Nurse Communication: Mobility status;Need for lift equipment PT Visit Diagnosis: Muscle weakness (generalized) (M62.81);History of falling (Z91.81);Other abnormalities of gait and mobility (R26.89)     Time: 8398-8381 PT Time Calculation (min) (ACUTE ONLY): 17 min  Charges:    $Therapeutic Activity: 8-22 mins PT General Charges $$ ACUTE PT VISIT: 1 Visit                     Katheryn Leap  PTA Acute  Rehabilitation Services Office M-F          [phone]

## 2024-02-17 NOTE — Plan of Care (Signed)
 Pt is alert and fully oriented x 4, pleasant, afebrile, stable hemodynamically, Atrial fib on the monitor, HR under controlled. Normal respiratory effort on BiPAP at night and 3 LPM of NCL. SPO2 94-98%. He is able to rest and sleep well after nursing interventions with no major complaints. Plan of care is reviewed. He has been progressing. We will monitor.   Problem: Clinical Measurements: Goal: Ability to maintain clinical measurements within normal limits will improve Outcome: Progressing Goal: Will remain free from infection Outcome: Progressing Goal: Diagnostic test results will improve Outcome: Progressing Goal: Respiratory complications will improve Outcome: Progressing Goal: Cardiovascular complication will be avoided Outcome: Progressing   Problem: Skin Integrity: Goal: Risk for impaired skin integrity will decrease Outcome: Progressing   Problem: Safety: Goal: Ability to remain free from injury will improve Outcome: Progressing   Problem: Elimination: Goal: Will not experience complications related to bowel motility Outcome: Progressing Goal: Will not experience complications related to urinary retention Outcome: Progressing   Problem: Coping: Goal: Level of anxiety will decrease Outcome: Progressing   Problem: Activity: Goal: Risk for activity intolerance will decrease Outcome: Progressing   Problem: Health Behavior/Discharge Planning: Goal: Ability to manage health-related needs will improve Outcome: Progressing   Wendi Dash, RN

## 2024-02-17 NOTE — Progress Notes (Addendum)
 Physical Therapy Treatment Patient Details Name: [name] MRN: [id number] DOB: February 07, 1951 Today's Date: 02/17/2024   History of Present Illness Patient is 74y.o. male admitted from home after a fall from his wheelchair and was down for ~8 hours. Reported some leg swelling and SOB. CT chest, abdomen and pelvis showed intramuscular hematoma in the anterior compartment of R thigh. Other significant PMH includes morbid obesity, L AKA, A-fib, hypothyroidism, GERD, headaches, CHF.    PT Comments  AxO x 3 pleasant and very motivated.  Co Tx with OT for transfers/Pt safety.   Assisted OOB to wheelchair via sliding board.   Pt progressing with each session.  LPT has rec ST Rehab at SNF to regain his prior level.    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 Restrictions Weight Bearing Restrictions Per Provider Order: No     Mobility  Bed Mobility Overal bed mobility: Needs Assistance Bed Mobility: Supine to Sit       Sit to supine: Mod assist, +2 for physical assistance, +2 for safety/equipment, HOB elevated, Used rails   General bed mobility comments: cues for hand placement and use of R LE to boost up the bed as patient typically uses trapeze at home    Transfers Overall transfer level: Needs assistance Equipment used: Sliding board Transfers: Bed to chair/wheelchair/BSC Sit to Stand: Mod assist, +2 physical assistance, +2 safety/equipment, Max assist          Lateral/Scoot Transfers: Mod assist, +2 physical assistance, +2 safety/equipment, With slide  board General transfer comment: safety with a 3rd for w/c stability and board placement    Ambulation/Gait      Performed sit to stand x 2 Max Assist twice from wc with FWB R LE and PWB through L stump which Pt placed on wc.  Very limited upright tolerance/time <  10 seconds (long enough to remove sliding board and straighten pad)         General Gait Details: non amb   Stairs             Wheelchair Mobility     Tilt Bed    Modified Rankin (Stroke Patients Only)       Balance                                            Communication Communication Communication: No apparent difficulties Factors Affecting Communication: Hearing impaired  Cognition Arousal: Alert Behavior During Therapy: WFL for tasks assessed/performed   PT - Cognitive impairments: No apparent impairments                       PT - Cognition Comments: having some difficulty recalling how he transferred from bed to Wc Following commands: Impaired Following commands impaired: Follows multi-step commands with increased time    Cueing Cueing Techniques: Verbal cues  Exercises      General Comments        Pertinent Vitals/Pain Pain Assessment Pain Assessment: No/denies pain  Home Living                          Prior Function            PT Goals (current goals can now be found in the care plan section) Progress towards PT goals: Progressing toward goals    Frequency    Min 2X/week      PT Plan      Co-evaluation PT/OT/SLP Co-Evaluation/Treatment: Yes   PT goals addressed during session: Mobility/safety with mobility;Balance;Proper use of DME;Strengthening/ROM        AM-PAC PT 6 Clicks Mobility   Outcome Measure  Help needed turning from your back to your side while in a flat bed without using bedrails?: Total Help needed moving from lying on your back to sitting on the side of a flat bed without using bedrails?: Total Help  needed moving to and from a bed to a chair (including a wheelchair)?: Total Help needed standing up from a chair using your arms (e.g., wheelchair or bedside chair)?: Total Help needed to walk in hospital room?: Total Help needed climbing 3-5 steps with a railing? : Total 6 Click Score: 6    End of Session Equipment Utilized During Treatment: Oxygen;Other (comment) Activity Tolerance: Patient tolerated treatment well Patient left: in chair;with call bell/phone within reach Nurse Communication: Mobility status;Need for lift equipment PT Visit Diagnosis: Muscle weakness (generalized) (M62.81);History of falling (Z91.81);Other abnormalities of gait and mobility (R26.89)     Time: 8679-8651 PT Time Calculation (min) (ACUTE ONLY): 28 min  Charges:    $Therapeutic Activity: 8-22 mins PT General Charges $$ ACUTE PT VISIT: 1 Visit                     Katheryn Leap  PTA Acute  Rehabilitation Services Office M-F          [phone]

## 2024-02-17 NOTE — [place] FL2 (Signed)
 " [place]  MEDICAID FL2 LEVEL OF CARE FORM     IDENTIFICATION  Patient Name: [name] Birthdate: 07-Nov-1950 Sex: male Admission Date (Current Location): 01/31/2024  [facility] and [place] Number:  Producer, Television/film/video and Address:  [facility],  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 02/08/2024   Intramuscular hematoma 01/31/2024   Essential hypertension 01/31/2024   Fall 01/31/2024   Unilateral AKA, left (HCC) 09/15/2021   Chronic systolic CHF (congestive heart failure) (HCC) 09/14/2021   Physical deconditioning 04/17/2021   Acute on chronic systolic CHF (congestive heart failure) (HCC) 04/17/2021   Pressure injury of skin 04/14/2021   Acute respiratory failure with hypoxia and hypercapnia (HCC) 01/19/2021   Acquired hypothyroidism    Macrocytic anemia    Morbid obesity with BMI of 60.0-69.9, adult (HCC)    Administration of long-term prophylactic antibiotics    History of DVT (deep vein thrombosis)    S/P rev left TK 12/15/2015   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   OSA (obstructive sleep apnea)-3 L of oxygen at night and not CPAP 11/27/2012   Chronic anticoagulation - hx of PAF 09/10/2012   Persistent atrial fibrillation (HCC) 08/26/2012   Morbid obesity with BMI of 70 and over, adult (HCC) 06/27/2012   CHRONIC KIDNEY DISEASE UNSPECIFIED 07/04/2008    Orientation RESPIRATION BLADDER Height & Weight     Self, Time, Situation,  Place  O2, Other (Comment) (bipap-has already been ordered & approved-just waiting for delivery to facility by [facility]) Continent Weight: (!) 189 kg Height:  5' 8 (172.7 cm)  BEHAVIORAL SYMPTOMS/MOOD NEUROLOGICAL BOWEL NUTRITION STATUS      Continent Diet (CHO MOD)  AMBULATORY STATUS COMMUNICATION OF NEEDS Skin   Extensive Assist Verbally PU Stage and Appropriate Care (Lower leg wounds-see d/c summary) PU Stage 1 Dressing: Daily                     Personal Care Assistance Level of Assistance  Bathing, Feeding, Dressing Bathing Assistance: Maximum assistance Feeding assistance: Limited assistance Dressing Assistance: Maximum assistance     Functional Limitations Info  Sight, Hearing, Speech Sight Info: Impaired Hearing Info: Adequate Speech Info: Adequate    SPECIAL CARE FACTORS FREQUENCY  PT (By licensed PT), OT (By licensed OT)     PT Frequency: 5x week OT Frequency: 5x week            Contractures Contractures Info: Not present    Additional Factors Info  Code Status, Allergies Code Status Info: Full Allergies Info: Morphine           Current Medications (02/17/2024):  This is the current hospital active medication list Current Facility-Administered Medications  Medication Dose Route Frequency Provider Last Rate Last Admin   acetaminophen  (TYLENOL ) tablet 650 mg  650 mg Oral Q6H PRN Gonfa, Taye T, MD   650 mg at 02/16/24 0939   Or   acetaminophen  (TYLENOL ) suppository 650 mg  650 mg Rectal  Q6H PRN Gonfa, Taye T, MD       apixaban  (ELIQUIS ) tablet 5 mg  5 mg Oral BID Gonfa, Taye T, MD   5 mg at 02/17/24 1056   arformoterol  (BROVANA ) nebulizer solution 15 mcg  15 mcg Nebulization BID Dewald, Jonathan B, MD   15 mcg at 02/17/24 0725   hydrocerin (EUCERIN) cream   Topical BID Gonfa, Taye T, MD   Given at 02/17/24 1056   levothyroxine  (SYNTHROID ) tablet 125 mcg  125 mcg Oral Q0600 Gonfa, Taye T, MD   125 mcg at 02/17/24 0547   metoprolol  succinate (TOPROL -XL) 24  hr tablet 75 mg  75 mg Oral Daily Gonfa, Taye T, MD   75 mg at 02/17/24 1056   ondansetron  (ZOFRAN ) tablet 4 mg  4 mg Oral Q6H PRN Gonfa, Taye T, MD   4 mg at 02/04/24 2126   Or   ondansetron  (ZOFRAN ) injection 4 mg  4 mg Intravenous Q6H PRN Gonfa, Taye T, MD       Oral care mouth rinse  15 mL Mouth Rinse PRN Gonfa, Taye T, MD       polyethylene glycol (MIRALAX  / GLYCOLAX ) packet 17 g  17 g Oral BID PRN Gonfa, Taye T, MD   17 g at 02/04/24 0853   revefenacin  (YUPELRI ) nebulizer solution 175 mcg  175 mcg Nebulization Daily Kara Carrier B, MD   175 mcg at 02/17/24 0725   senna-docusate (Senokot-S) tablet 2 tablet  2 tablet Oral BID PRN Gonfa, Taye T, MD   2 tablet at 02/04/24 0851   sodium phosphate  (FLEET) enema 1 enema  1 enema Rectal Daily PRN Gonfa, Taye T, MD   1 enema at 02/03/24 1807   torsemide  (DEMADEX ) tablet 40 mg  40 mg Oral Daily Gonfa, Taye T, MD   40 mg at 02/17/24 1056     Discharge Medications: Please see discharge summary for a list of discharge medications.  Relevant Imaging Results:  Relevant Lab Results:   Additional Information [id number];ht-5'8  ss#[phone]  Amanat Hackel, Nathanel, RN     "

## 2024-02-17 NOTE — Progress Notes (Addendum)
 Occupational Therapy Treatment Patient Details Name: [name] MRN: [id number] DOB: 04-16-1950 Today's Date: 02/17/2024   History of present illness Patient is 73y.o. male admitted from home after a fall from his wheelchair and was down for ~8 hours. Reported some leg swelling and SOB. CT chest, abdomen and pelvis showed intramuscular hematoma in the anterior compartment of R thigh. Other significant PMH includes morbid obesity, L AKA, A-fib, hypothyroidism, GERD, headaches, CHF.   OT comments  Pt. Seen with PT for skilled therapy session. Session focused on bed mobility and use of sliding board with emphasis on pt. guiding care.  Pt. Able to direct placement of all equipment and positioning of self prior to initiation of use of sliding board to w/c.  Partial sit/stand for board removal with pt. Utilizing BUE support on recliner.  Pt. Able to complete seated BUE exercises with use of red theraband.  Pt. Expresses wishes to continue working on sit/stands.  Verbalizes understanding of importance of daily Completion of HEP of UB/LB in prep for cont. Mobility efforts.   Pt. On RA for duration of tx session.  Able to maintain low 90% throughout with good tech. Of PLB during rest breaks.   HR 120s with sit/stand but back to 80s/90s during other portions of tx session.         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       Mobility Bed Mobility Overal bed mobility: Needs Assistance Bed Mobility: Supine to Sit     Supine to sit: Mod assist, +2 for physical assistance, +2 for  safety/equipment, HOB elevated, Used rails     General bed mobility comments: cues for hand placement and use of R LE to boost up the bed as patient typically uses trapeze at home    Transfers Overall transfer level: Needs assistance Equipment used: Sliding board Transfers: Bed to chair/wheelchair/BSC Sit to Stand: Mod assist, +2 physical assistance, +2 safety/equipment, Max assist          Lateral/Scoot Transfers: Mod assist, +2 physical assistance, +2 safety/equipment, With slide board General transfer comment: safety with a 3rd for w/c stability and board placement     Balance                                           ADL either performed or assessed with clinical judgement   ADL                                              Extremity/Trunk Assessment              Vision       Perception     Praxis     Communication Communication Communication: No apparent difficulties Factors Affecting Communication: Hearing impaired   Cognition Arousal: Alert Behavior During Therapy: WFL for tasks assessed/performed               OT - Cognition Comments: able to  assist with directing care and min cues to problem solve board and w/c placement for bed to w/c                 Following commands: Impaired Following commands impaired: Follows multi-step commands with increased time      Cueing   Cueing Techniques: Verbal cues  Exercises General Exercises - Upper Extremity Shoulder Horizontal ABduction: Both, 5 reps, Seated, Theraband, Strengthening Theraband Level (Shoulder Horizontal Abduction): Level 2 (Red) Shoulder Horizontal ADduction: Both, 5 reps, Seated, Theraband, Strengthening Theraband Level (Shoulder Horizontal Adduction): Level 2 (Red)    Shoulder Instructions       General Comments  Reviewed need for pt. To cont. To guide/direct care as that is an important part of I and safety with all mobility and ADLs.       Pertinent Vitals/ Pain       Pain Assessment Pain Assessment: No/denies pain  Home Living                                          Prior Functioning/Environment              Frequency  Min 2X/week        Progress Toward Goals  OT Goals(current goals can now be found in the care plan section)  Progress towards OT goals: Progressing toward goals     Plan      Co-evaluation        PT goals addressed during session: Mobility/safety with mobility;Balance;Proper use of DME;Strengthening/ROM OT goals addressed during session: ADL's and self-care;Proper use of Adaptive equipment and DME;Strengthening/ROM      AM-PAC OT 6 Clicks Daily Activity     Outcome Measure   Help from another person eating meals?: None Help from another person taking care of personal grooming?: None Help from another person toileting, which includes using toliet, bedpan, or urinal?: A Lot Help from another person bathing (including washing, rinsing, drying)?: A Lot Help from another person to put on and taking off regular upper body clothing?: A Little Help from another person to put on and taking off regular lower body clothing?: A Lot 6 Click Score: 17    End of Session Equipment Utilized During Treatment: Gait belt;Oxygen (on RA during session placed [place] back on at end of session)  OT Visit Diagnosis: Unsteadiness on feet (R26.81);Other abnormalities of gait and mobility (R26.89);History of falling (Z91.81);Muscle weakness (generalized) (M62.81);Cognitive communication deficit (R41.841);Pain Pain - part of body: Leg;Arm;Hand;Shoulder   Activity Tolerance Patient tolerated treatment well   Patient Left in chair;with call bell/phone within reach;Other (comment) (in w/c with maxi pad in place for assiting pt. back to bed)   Nurse Communication          Time: 8679-8645 OT Time Calculation (min): 34 min  Charges: OT General Charges $OT Visit: 1 Visit OT  Treatments $Therapeutic Activity: 8-22 mins  [name], COTA/L Acute Rehabilitation [phone]   CHRISTELLA Nest Lorraine-COTA/L  02/17/2024, 2:20 PM

## 2024-02-17 NOTE — Progress Notes (Signed)
 Pt placed on BP through [facility] with the below settings. Pt respiratory status stable on BP at this time w/no distress noted. Pt tolerating well.   02/17/24 0007  BiPAP/CPAP/SIPAP  $ Non-Invasive Ventilator  Non-Invasive Vent Subsequent  $ Face Mask Medium Yes  BiPAP/CPAP/SIPAP Pt Type Adult  BiPAP/CPAP/SIPAP SERVO  Mask Type Full face mask  Dentures removed? Not applicable  Mask Size Large  Set Rate 16 breaths/min  Respiratory Rate 29 breaths/min  IPAP 18 cmH20  EPAP 6 cmH2O  PEEP 6 cmH20  FiO2 (%) 30 %  Minute Ventilation 14.2  Leak 62  Peak Inspiratory Pressure (PIP) 19  Tidal Volume (Vt) 430  Patient Home Machine No  Patient Home Mask No  Patient Home Tubing No  Auto Titrate No  Press High Alarm 30 cmH2O  Press Low Alarm 16 cmH2O  CPAP/SIPAP surface wiped down Yes  Device Plugged into RED Power Outlet Yes  Oxygen Percent 30 %  BiPAP/CPAP /SiPAP Vitals  Pulse Rate 99  Resp (!) 28  SpO2 98 %  Bilateral Breath Sounds Clear;Diminished  MEWS Score/Color  MEWS Score 2  MEWS Score Color Yellow

## 2024-02-17 NOTE — TOC Progression Note (Addendum)
 Transition of Care [facility]) - Progression Note    Patient Details  Name: [name] MRN: [id number] Date of Birth: 09-05-1950  [facility]) CM/SW Contact  Fumiye Lubben, Nathanel, RN Phone Number: 02/17/2024, 9:37 AM  Clinical Narrative:  [facility](CIR) rep Delon [phone] awaiting shara, can accept over weekend-[name] #[phone] if auth provided;[facility] rep Rosina has already received auth for NIV, will provide 02 also to facility-I have provided the facility rep jennifer w/Ashley contact #.PTAR @ d/c wt per epic-417lbs.  -10a-P2P offered 1 [phone],option 5 deadline by 2p name-[name], 01/17/1951;mbr#[phone].  -12p-patient denied auth post P2P-recc lower level of care-ST SNF-will await PT to see prior fl2 then fax out for ST SNF.confirm weight as noted 417lbs.  -4:34p patient declined to fast track appeal denial to Inpt Rehab-prefers to f/u on ST SNF. PT recc ST SNF-faxed out await bed offers.  Expected Discharge Plan: IP Rehab Facility Barriers to Discharge: English As A Second Language Teacher               Expected Discharge Plan and Services   Discharge Planning Services: CM Consult Post Acute Care Choice: IP Rehab Living arrangements for the past 2 months: Single Family Home                 DME Arranged: Other see comment [facility] rep Ashley-NIV-approved & ready to deliver to facility @ d/c.) DME Agency: Other - Comment ([facility]-rep Rosina) Date DME Agency Contacted: 02/17/24 Time DME Agency Contacted: [phone] Representative spoke with at DME Agency: Rosina             Social Drivers of Health (SDOH) Interventions SDOH Screenings   Food Insecurity: Patient Unable To Answer (02/05/2024)  Housing: Low Risk (02/05/2024)  Transportation Needs: Patient Unable To Answer (02/05/2024)  Utilities: Patient Unable To Answer (02/05/2024)  Financial Resource Strain: Low Risk (04/13/2023)   Received from [facility]  Physical Activity: Unknown (04/13/2023)    Received from [facility]  Social Connections: Patient Unable To Answer (02/05/2024)  Stress: Stress Concern Present (04/13/2023)   Received from [facility]  Tobacco Use: Low Risk (01/31/2024)    Readmission Risk Interventions     No data to display

## 2024-02-17 NOTE — Progress Notes (Signed)
 RT called to pts room. Pt requesting to come off of BP and to be placed back on [place] 3 L at this time. Pt respiratory status stable at this time w/no distress noted. RN notified of pt changing from BP back to [place].   02/17/24 0223  Oxygen Therapy/Pulse Ox  O2 Device (S)  Nasal Cannula  SpO2 94 %  O2 Therapy Oxygen humidified  O2 Flow Rate (L/min) 3 L/min  FiO2 (%) 32 %

## 2024-02-17 NOTE — Progress Notes (Signed)
 " PROGRESS NOTE    [name]  FMW:[id number] DOB: 12/29/1950 DOA: 01/31/2024 PCP: Pura Lenis, MD   Brief Narrative:  74 y.o. male with medical history significant for morbid obesity, A-fib on Eliquis , OSA, status post left AKA, hypothyroidism, chronic systolic heart failure and DVT presented after a fall.  On presentation, he was hypoxic with chest x-ray showing vascular congestion and required BiPAP and diuresis.  He was also found to have intramuscular hematoma in the anterior compartment of the proximal left thigh.  Subsequently, his respiratory status has improved.  PCCM has signed off.  PT recommended SNF placement.  He is currently medically stable for discharge but awaiting safe disposition.  TOC following.  Assessment & Plan:   Acute respiratory failure with hypoxia and hypercapnia - Continue at bedtime BiPAP even on discharge.  Currently on 3-4 L oxygen via nasal cannula. -PCCM has already signed off.  Outpatient follow-up with CCM.  Continue current nebs.  Acute on chronic systolic CHF - Currently compensated.  Continue torsemide , metoprolol  succinate.  Strict input and output.  Daily weights.  Fluid restriction.  Paroxysmal A-fib - Rate mostly controlled.  Continue Eliquis  and metoprolol  succinate  Fall - Out of his wheelchair. -He is currently medically stable for discharge but awaiting safe disposition.  TOC following.  Obesity class III - Outpatient follow-up  Intramuscular hematoma in the anterior compartment of the proximal left thigh - Due to fall.  Xarelto  has already been resumed  Ulcer of extremity due to chronic venous insufficiency of right leg - Continue local wound care.  Hypophosphatemia - Resolved  Thrombocytopenia - Resolved  Leukopenia - No recent labs  Anemia of chronic disease Macrocytosis - From chronic illnesses.  Hemoglobin stable.  Monitor intermittently   DVT prophylaxis: Eliquis  Code Status: Full Family Communication:  None at bedside Disposition Plan: Status is: Inpatient Remains inpatient appropriate because: Of severity of illness    Consultants: PCCM  Procedures: As above  Antimicrobials: None currently   Subjective: Patient seen and examined at bedside.  Sleepy, wakes up slightly, slow to respond.  No fever, agitation or vomiting reported.   Objective: Vitals:   02/17/24 0017 02/17/24 0223 02/17/24 0531 02/17/24 0728  BP:   101/66   Pulse:   (!) 105   Resp: 20  19   Temp:   98.3 F (36.8 C)   TempSrc:   Oral   SpO2:  94% 92% 93%  Weight:   (!) 189 kg   Height:        Intake/Output Summary (Last 24 hours) at 02/17/2024 0747 Last data filed at 02/17/2024 0500 Gross per 24 hour  Intake 1980 ml  Output 3100 ml  Net -1120 ml   Filed Weights   02/15/24 0359 02/16/24 1000 02/17/24 0531  Weight: (!) 201.9 kg (!) 187.9 kg (!) 189 kg    Examination:  General: No acute distress.  Currently on 3.5 L oxygen by nasal cannula.  Respiratory: Bilateral decreased breath sounds at bases with scattered crackles CVS: S1-S2 heard; has mild intermittent tachycardia abdominal: Soft, morbidly obese, nontender, distended mildly; no organomegaly; bowel sounds are heard normally extremities: Left AKA present  Data Reviewed: I have personally reviewed following labs and imaging studies  CBC: No results for input(s): WBC, NEUTROABS, HGB, HCT, MCV, PLT in the last 168 hours. Basic Metabolic Panel: Recent Labs  Lab 02/11/24 0534  NA 143  K 3.7  CL 101  CO2 33*  GLUCOSE 88  BUN 21  CREATININE 0.91  CALCIUM  9.5   GFR: Estimated Creatinine Clearance: 119.2 mL/min (by C-G formula based on SCr of 0.91 mg/dL). Liver Function Tests: No results for input(s): AST, ALT, ALKPHOS, BILITOT, PROT, ALBUMIN  in the last 168 hours. No results for input(s): LIPASE, AMYLASE in the last 168 hours. No results for input(s): AMMONIA in the last 168 hours. Coagulation Profile: No  results for input(s): INR, PROTIME in the last 168 hours. Cardiac Enzymes: No results for input(s): CKTOTAL, CKMB, CKMBINDEX, TROPONINI in the last 168 hours. BNP (last 3 results) Recent Labs    01/31/24 1436  PROBNP 1,218.0*   HbA1C: No results for input(s): HGBA1C in the last 72 hours. CBG: No results for input(s): GLUCAP in the last 168 hours.  Lipid Profile: No results for input(s): CHOL, HDL, LDLCALC, TRIG, CHOLHDL, LDLDIRECT in the last 72 hours. Thyroid  Function Tests: No results for input(s): TSH, T4TOTAL, FREET4, T3FREE, THYROIDAB in the last 72 hours. Anemia Panel: No results for input(s): VITAMINB12, FOLATE, FERRITIN, TIBC, IRON, RETICCTPCT in the last 72 hours. Sepsis Labs: No results for input(s): PROCALCITON, LATICACIDVEN in the last 168 hours.  No results found for this or any previous visit (from the past 240 hours).        Radiology Studies: No results found.      Scheduled Meds:  apixaban   5 mg Oral BID   arformoterol   15 mcg Nebulization BID   hydrocerin   Topical BID   levothyroxine   125 mcg Oral Q0600   metoprolol  succinate  75 mg Oral Daily   revefenacin   175 mcg Nebulization Daily   torsemide   40 mg Oral Daily   Continuous Infusions:        Sophie Mao, MD [facility] 02/17/2024, 7:47 AM   "

## 2024-02-18 DIAGNOSIS — J9602 Acute respiratory failure with hypercapnia: Secondary | ICD-10-CM | POA: Diagnosis not present

## 2024-02-18 DIAGNOSIS — J9601 Acute respiratory failure with hypoxia: Secondary | ICD-10-CM | POA: Diagnosis not present

## 2024-02-18 NOTE — Progress Notes (Signed)
" °   02/18/24 2300  BiPAP/CPAP/SIPAP  BiPAP/CPAP/SIPAP Pt Type Adult  BiPAP/CPAP/SIPAP SERVO  Mask Type Full face mask  Dentures removed? Not applicable  Mask Size Medium  Set Rate 16 breaths/min  Respiratory Rate 22 breaths/min  IPAP 18 cmH20  EPAP 6 cmH2O  Pressure Support 12 cmH20  PEEP 6 cmH20  FiO2 (%) 30 %  Minute Ventilation 10.4  Leak 81  Peak Inspiratory Pressure (PIP) 17  Tidal Volume (Vt) 576  Patient Home Machine No  Patient Home Mask No  Patient Home Tubing No  Auto Titrate No  Press High Alarm 30 cmH2O  Nasal massage performed Yes  Device Plugged into RED Power Outlet Yes  Oxygen Percent 30 %  BiPAP/CPAP /SiPAP Vitals  Pulse Rate 77  SpO2 92 %  Bilateral Breath Sounds Clear;Diminished    "

## 2024-02-18 NOTE — Progress Notes (Signed)
 " PROGRESS NOTE    [name]  FMW:[id number] DOB: October 28, 1950 DOA: 01/31/2024 PCP: Pura Lenis, MD   Brief Narrative:  74 y.o. male with medical history significant for morbid obesity, A-fib on Eliquis , OSA, status post left AKA, hypothyroidism, chronic systolic heart failure and DVT presented after a fall.  On presentation, he was hypoxic with chest x-ray showing vascular congestion and required BiPAP and diuresis.  He was also found to have intramuscular hematoma in the anterior compartment of the proximal left thigh.  Subsequently, his respiratory status has improved.  PCCM has signed off.  PT recommended SNF placement.  He is currently medically stable for discharge but awaiting safe disposition.  TOC following.  Assessment & Plan:   Acute respiratory failure with hypoxia and hypercapnia - Continue at bedtime BiPAP even on discharge.  Currently on 3-4 L oxygen via nasal cannula. -PCCM has already signed off.  Outpatient follow-up with CCM.  Continue current nebs.  Acute on chronic systolic CHF - Currently compensated.  Continue torsemide , metoprolol  succinate.  Strict input and output.  Daily weights.  Fluid restriction.  Paroxysmal A-fib - Rate mostly controlled.  Continue Eliquis  and metoprolol  succinate  Fall - Out of his wheelchair. -He is currently medically stable for discharge but awaiting safe disposition.  TOC following.  Obesity class III - Outpatient follow-up  Intramuscular hematoma in the anterior compartment of the proximal left thigh - Due to fall.  Xarelto  has already been resumed  Ulcer of extremity due to chronic venous insufficiency of right leg - Continue local wound care.  Hypophosphatemia - Resolved  Thrombocytopenia - Resolved  Leukopenia - No recent labs  Anemia of chronic disease Macrocytosis - From chronic illnesses.  Hemoglobin stable.  Monitor intermittently   DVT prophylaxis: Eliquis  Code Status: Full Family Communication:  None at bedside Disposition Plan: Status is: Inpatient Remains inpatient appropriate because: Of severity of illness    Consultants: PCCM  Procedures: As above  Antimicrobials: None currently   Subjective: Patient seen and examined at bedside.  No agitation, fever or vomiting reported.  Objective: Vitals:   02/18/24 0254 02/18/24 0300 02/18/24 0343 02/18/24 0534  BP:    106/74  Pulse:    84  Resp: 17  19 18   Temp:    97.9 F (36.6 C)  TempSrc:      SpO2:    91%  Weight:  (!) 190.4 kg    Height:        Intake/Output Summary (Last 24 hours) at 02/18/2024 0759 Last data filed at 02/18/2024 0500 Gross per 24 hour  Intake 480 ml  Output 3100 ml  Net -2620 ml   Filed Weights   02/16/24 1000 02/17/24 0531 02/18/24 0300  Weight: (!) 187.9 kg (!) 189 kg (!) 190.4 kg    Examination:  General: On 4 L oxygen by nasal cannula.  No distress  respiratory: Decreased breath sounds at bases bilaterally with some crackles CVS: Rate mostly controlled; S1 and S2 heard  abdominal: Soft, morbidly obese, nontender, distended slightly; no organomegaly; normal bowel sounds are heard extremities: Has left AKA  Data Reviewed: I have personally reviewed following labs and imaging studies  CBC: No results for input(s): WBC, NEUTROABS, HGB, HCT, MCV, PLT in the last 168 hours. Basic Metabolic Panel: No results for input(s): NA, K, CL, CO2, GLUCOSE, BUN, CREATININE, CALCIUM , MG, PHOS in the last 168 hours.  GFR: Estimated Creatinine Clearance: 119.8 mL/min (by C-G formula based on SCr of 0.91 mg/dL). Liver Function  Tests: No results for input(s): AST, ALT, ALKPHOS, BILITOT, PROT, ALBUMIN  in the last 168 hours. No results for input(s): LIPASE, AMYLASE in the last 168 hours. No results for input(s): AMMONIA in the last 168 hours. Coagulation Profile: No results for input(s): INR, PROTIME in the last 168 hours. Cardiac Enzymes: No  results for input(s): CKTOTAL, CKMB, CKMBINDEX, TROPONINI in the last 168 hours. BNP (last 3 results) Recent Labs    01/31/24 1436  PROBNP 1,218.0*   HbA1C: No results for input(s): HGBA1C in the last 72 hours. CBG: No results for input(s): GLUCAP in the last 168 hours.  Lipid Profile: No results for input(s): CHOL, HDL, LDLCALC, TRIG, CHOLHDL, LDLDIRECT in the last 72 hours. Thyroid  Function Tests: No results for input(s): TSH, T4TOTAL, FREET4, T3FREE, THYROIDAB in the last 72 hours. Anemia Panel: No results for input(s): VITAMINB12, FOLATE, FERRITIN, TIBC, IRON, RETICCTPCT in the last 72 hours. Sepsis Labs: No results for input(s): PROCALCITON, LATICACIDVEN in the last 168 hours.  No results found for this or any previous visit (from the past 240 hours).        Radiology Studies: No results found.      Scheduled Meds:  apixaban   5 mg Oral BID   arformoterol   15 mcg Nebulization BID   hydrocerin   Topical BID   levothyroxine   125 mcg Oral Q0600   metoprolol  succinate  75 mg Oral Daily   revefenacin   175 mcg Nebulization Daily   torsemide   40 mg Oral Daily   Continuous Infusions:        Sophie Mao, MD [facility] 02/18/2024, 7:59 AM   "

## 2024-02-19 DIAGNOSIS — J9602 Acute respiratory failure with hypercapnia: Secondary | ICD-10-CM | POA: Diagnosis not present

## 2024-02-19 DIAGNOSIS — J9601 Acute respiratory failure with hypoxia: Secondary | ICD-10-CM | POA: Diagnosis not present

## 2024-02-19 MED ORDER — POLYVINYL ALCOHOL 1.4 % OP SOLN
1.0000 [drp] | OPHTHALMIC | Status: DC | PRN
  Administered 2024-02-19: 1 [drp] via OPHTHALMIC
  Filled 2024-02-19: qty 15

## 2024-02-19 NOTE — Progress Notes (Signed)
" °   02/19/24 2312  BiPAP/CPAP/SIPAP  BiPAP/CPAP/SIPAP Pt Type Adult  BiPAP/CPAP/SIPAP SERVO (air)  Mask Type Full face mask  Dentures removed? Not applicable  Mask Size Medium  Set Rate 16 breaths/min  Respiratory Rate 26 breaths/min  IPAP 18 cmH20  EPAP 6 cmH2O  Pressure Support 12 cmH20  PEEP 6 cmH20  FiO2 (%) 35 %  Flow Rate 0 lpm  Minute Ventilation 12.3  Leak 65  Peak Inspiratory Pressure (PIP) 18  Tidal Volume (Vt) 486  Patient Home Machine No  Patient Home Mask No  Patient Home Tubing No  Auto Titrate No  Press High Alarm 30 cmH2O  Device Plugged into RED Power Outlet Yes  Oxygen Percent 35 %  BiPAP/CPAP /SiPAP Vitals  Resp (!) 25  MEWS Score/Color  MEWS Score 1  MEWS Score Color Green    "

## 2024-02-19 NOTE — Progress Notes (Signed)
 " PROGRESS NOTE    [name]  FMW:[id number] DOB: 02/09/1951 DOA: 01/31/2024 PCP: Pura Lenis, MD   Brief Narrative:  74 y.o. male with medical history significant for morbid obesity, A-fib on Eliquis , OSA, status post left AKA, hypothyroidism, chronic systolic heart failure and DVT presented after a fall.  On presentation, he was hypoxic with chest x-ray showing vascular congestion and required BiPAP and diuresis.  He was also found to have intramuscular hematoma in the anterior compartment of the proximal left thigh.  Subsequently, his respiratory status has improved.  PCCM has signed off.  PT recommended SNF placement.  He is currently medically stable for discharge but awaiting safe disposition.  TOC following.  Assessment & Plan:   Acute respiratory failure with hypoxia and hypercapnia - Continue at bedtime BiPAP even on discharge.  Currently on 3-4 L oxygen via nasal cannula. -PCCM has already signed off.  Outpatient follow-up with CCM.  Continue current nebs.  Acute on chronic systolic CHF - Currently compensated.  Continue torsemide , metoprolol  succinate.  Strict input and output.  Daily weights.  Fluid restriction.  Paroxysmal A-fib - Rate mostly controlled.  Continue Eliquis  and metoprolol  succinate  Fall -Out of his wheelchair. -He is currently medically stable for discharge but awaiting safe disposition.  TOC following.  Obesity class III - Outpatient follow-up  Intramuscular hematoma in the anterior compartment of the proximal left thigh - Due to fall.  Xarelto  has already been resumed  Ulcer of extremity due to chronic venous insufficiency of right leg - Continue local wound care.  Hypophosphatemia - Resolved  Thrombocytopenia - Resolved  Leukopenia - No recent labs  Anemia of chronic disease Macrocytosis - From chronic illnesses.  Hemoglobin stable.  Monitor intermittently   DVT prophylaxis: Eliquis  Code Status: Full Family Communication: None  at bedside Disposition Plan: Status is: Inpatient Remains inpatient appropriate because: Of severity of illness    Consultants: PCCM  Procedures: As above  Antimicrobials: None currently   Subjective: Patient seen and examined at bedside.  No fever, vomiting, worsening shortness of breath reported.   Objective: Vitals:   02/19/24 0219 02/19/24 0301 02/19/24 0500 02/19/24 0527  BP:    113/87  Pulse:  79  97  Resp: 19 (!) 22    Temp:    98.2 F (36.8 C)  TempSrc:    Oral  SpO2:  92%  92%  Weight:   (!) 187 kg   Height:        Intake/Output Summary (Last 24 hours) at 02/19/2024 0814 Last data filed at 02/19/2024 0550 Gross per 24 hour  Intake 600 ml  Output 2600 ml  Net -2000 ml   Filed Weights   02/17/24 0531 02/18/24 0300 02/19/24 0500  Weight: (!) 189 kg (!) 190.4 kg (!) 187 kg    Examination:  General: Remains on 4 L oxygen via nasal cannula and in no acute distress respiratory: Bilateral decreased breath sounds at bases with basilar crackles  CVS: S1 and S2 are heard; rate mostly controlled abdominal: Soft, morbidly obese, nontender, mildly distended; no organomegaly; bowel sounds are normally heard  extremities: Left AKA present  Data Reviewed: I have personally reviewed following labs and imaging studies  CBC: No results for input(s): WBC, NEUTROABS, HGB, HCT, MCV, PLT in the last 168 hours. Basic Metabolic Panel: No results for input(s): NA, K, CL, CO2, GLUCOSE, BUN, CREATININE, CALCIUM , MG, PHOS in the last 168 hours.  GFR: Estimated Creatinine Clearance: 118.4 mL/min (by C-G formula based  on SCr of 0.91 mg/dL). Liver Function Tests: No results for input(s): AST, ALT, ALKPHOS, BILITOT, PROT, ALBUMIN  in the last 168 hours. No results for input(s): LIPASE, AMYLASE in the last 168 hours. No results for input(s): AMMONIA in the last 168 hours. Coagulation Profile: No results for input(s): INR,  PROTIME in the last 168 hours. Cardiac Enzymes: No results for input(s): CKTOTAL, CKMB, CKMBINDEX, TROPONINI in the last 168 hours. BNP (last 3 results) Recent Labs    01/31/24 1436  PROBNP 1,218.0*   HbA1C: No results for input(s): HGBA1C in the last 72 hours. CBG: No results for input(s): GLUCAP in the last 168 hours.  Lipid Profile: No results for input(s): CHOL, HDL, LDLCALC, TRIG, CHOLHDL, LDLDIRECT in the last 72 hours. Thyroid  Function Tests: No results for input(s): TSH, T4TOTAL, FREET4, T3FREE, THYROIDAB in the last 72 hours. Anemia Panel: No results for input(s): VITAMINB12, FOLATE, FERRITIN, TIBC, IRON, RETICCTPCT in the last 72 hours. Sepsis Labs: No results for input(s): PROCALCITON, LATICACIDVEN in the last 168 hours.  No results found for this or any previous visit (from the past 240 hours).        Radiology Studies: No results found.      Scheduled Meds:  apixaban   5 mg Oral BID   arformoterol   15 mcg Nebulization BID   hydrocerin   Topical BID   levothyroxine   125 mcg Oral Q0600   metoprolol  succinate  75 mg Oral Daily   revefenacin   175 mcg Nebulization Daily   torsemide   40 mg Oral Daily   Continuous Infusions:        Sophie Mao, MD [facility] 02/19/2024, 8:14 AM   "

## 2024-02-20 NOTE — Progress Notes (Signed)
" °   02/20/24 2312  BiPAP/CPAP/SIPAP  BiPAP/CPAP/SIPAP Pt Type Adult  BiPAP/CPAP/SIPAP SERVO  Mask Type Full face mask  Dentures removed? Not applicable  Mask Size Medium  Set Rate 16 breaths/min  Respiratory Rate 22 breaths/min  IPAP 18 cmH20  EPAP 6 cmH2O  FiO2 (%) 35 %  Minute Ventilation 11.3  Leak 57  Peak Inspiratory Pressure (PIP) 18  Tidal Volume (Vt) 615  Patient Home Machine No  Patient Home Mask No  Patient Home Tubing No  Auto Titrate No  Device Plugged into RED Power Outlet Yes    "

## 2024-02-20 NOTE — TOC Progression Note (Signed)
 Transition of Care [facility]) - Progression Note    Patient Details  Name: [name] MRN: [id number] Date of Birth: 10/17/1950  [facility] CM/SW Contact  Smith Potenza, Nathanel, RN Phone Number: 02/20/2024, 12:15 PM  Clinical Narrative:  await choice, then shara   Service provider  [facility]     [facility]  Accepted 1 [address] [place] [address] [phone]  [facility]  Accepted 2 [address] [place] [address] [phone]  [facility]  Accepted  [address] [place], [place] [address] [phone]  [facility]  Accepted  [address] [place] [address] [phone]    Expected Discharge Plan: Skilled Nursing Facility Barriers to Discharge: Continued Medical Work up               Expected Discharge Plan and Services   Discharge Planning Services: CM Consult Post Acute Care Choice: IP Rehab Living arrangements for the past 2 months: Single Family Home                 DME Arranged: Other see comment [facility] rep Ashley-[facility]-approved & ready to deliver to facility @ d/c.) DME Agency: Other - Comment ([facility]-rep Rosina) Date DME Agency Contacted: 02/17/24 Time DME Agency Contacted: [phone] Representative spoke with at DME Agency: Rosina             Social Drivers of Health (SDOH) Interventions SDOH Screenings   Food Insecurity: Patient Unable To Answer (02/05/2024)  Housing: Low Risk (02/05/2024)  Transportation Needs: Patient Unable To Answer (02/05/2024)  Utilities: Patient Unable To Answer (02/05/2024)  Financial Resource Strain: Low Risk (04/13/2023)   Received from [facility]  Physical Activity: Unknown (04/13/2023)   Received from [facility]  Social Connections: Patient Unable To Answer (02/05/2024)  Stress: Stress Concern Present (04/13/2023)   Received from [facility]  Tobacco Use: Low Risk (01/31/2024)     Readmission Risk Interventions     No data to display

## 2024-02-20 NOTE — Progress Notes (Signed)
 " PROGRESS NOTE    [name]  FMW:[id number] DOB: 1950-05-13 DOA: 01/31/2024 PCP: Pura Lenis, MD   Brief Narrative:  74 y.o. male with medical history significant for morbid obesity, A-fib on Eliquis , OSA, status post left AKA, hypothyroidism, chronic systolic heart failure and DVT presented after a fall.  On presentation, he was hypoxic with chest x-ray showing vascular congestion and required BiPAP and diuresis.  He was also found to have intramuscular hematoma in the anterior compartment of the proximal left thigh.  Subsequently, his respiratory status has improved.  PCCM has signed off.  PT recommended SNF placement.  He is currently medically stable for discharge but awaiting safe disposition.  TOC following.  Assessment & Plan:   Acute respiratory failure with hypoxia and hypercapnia - Continue at bedtime BiPAP even on discharge.  Currently on 3-4 L oxygen via nasal cannula. -PCCM has already signed off.  Outpatient follow-up with CCM.  Continue current nebs.  Acute on chronic systolic CHF - Currently compensated.  Continue torsemide , metoprolol  succinate.  Strict input and output.  Daily weights.  Fluid restriction.  Paroxysmal A-fib - Rate mostly controlled.  Continue Eliquis  and metoprolol  succinate  Fall -Out of his wheelchair. -He is currently medically stable for discharge but awaiting safe disposition.  TOC following.  Obesity class III - Outpatient follow-up  Intramuscular hematoma in the anterior compartment of the proximal left thigh - Due to fall.  Xarelto  has already been resumed  Ulcer of extremity due to chronic venous insufficiency of right leg - Continue local wound care.  Hypophosphatemia - Resolved  Thrombocytopenia - Resolved  Leukopenia - No recent labs  Anemia of chronic disease Macrocytosis - From chronic illnesses.  Hemoglobin stable.  Monitor intermittently   DVT prophylaxis: Eliquis  Code Status: Full Family Communication: None  at bedside Disposition Plan: Status is: Inpatient Remains inpatient appropriate because: Of severity of illness    Consultants: PCCM  Procedures: As above  Antimicrobials: None currently   Subjective: Patient seen and examined at bedside.  Denies any new complaints.   Objective: Vitals:   02/19/24 2245 02/19/24 2312 02/20/24 0422 02/20/24 0446  BP: 106/80  118/79   Pulse: 67  96   Resp:  (!) 25 18   Temp:   98 F (36.7 C)   TempSrc:      SpO2:   94%   Weight:    (!) 187.7 kg  Height:        Intake/Output Summary (Last 24 hours) at 02/20/2024 0749 Last data filed at 02/20/2024 0630 Gross per 24 hour  Intake 360 ml  Output 2900 ml  Net -2540 ml   Filed Weights   02/18/24 0300 02/19/24 0500 02/20/24 0446  Weight: (!) 190.4 kg (!) 187 kg (!) 187.7 kg    Examination:  General: In no distress.  On 4 L oxygen.  Chronically ill and deconditioned appropriate respiratory: Decreased breath sounds at bases bilaterally with some crackles CVS: Rate is controlled; S1 and S2 heard.   Abdominal: Soft, morbidly obese, nontender, remains slightly distended; no organomegaly; bowel sounds are heard  extremities: Has left AKA  Data Reviewed: I have personally reviewed following labs and imaging studies  CBC: No results for input(s): WBC, NEUTROABS, HGB, HCT, MCV, PLT in the last 168 hours. Basic Metabolic Panel: No results for input(s): NA, K, CL, CO2, GLUCOSE, BUN, CREATININE, CALCIUM , MG, PHOS in the last 168 hours.  GFR: Estimated Creatinine Clearance: 118.7 mL/min (by C-G formula based on SCr of  0.91 mg/dL). Liver Function Tests: No results for input(s): AST, ALT, ALKPHOS, BILITOT, PROT, ALBUMIN  in the last 168 hours. No results for input(s): LIPASE, AMYLASE in the last 168 hours. No results for input(s): AMMONIA in the last 168 hours. Coagulation Profile: No results for input(s): INR, PROTIME in the last 168  hours. Cardiac Enzymes: No results for input(s): CKTOTAL, CKMB, CKMBINDEX, TROPONINI in the last 168 hours. BNP (last 3 results) Recent Labs    01/31/24 1436  PROBNP 1,218.0*   HbA1C: No results for input(s): HGBA1C in the last 72 hours. CBG: No results for input(s): GLUCAP in the last 168 hours.  Lipid Profile: No results for input(s): CHOL, HDL, LDLCALC, TRIG, CHOLHDL, LDLDIRECT in the last 72 hours. Thyroid  Function Tests: No results for input(s): TSH, T4TOTAL, FREET4, T3FREE, THYROIDAB in the last 72 hours. Anemia Panel: No results for input(s): VITAMINB12, FOLATE, FERRITIN, TIBC, IRON, RETICCTPCT in the last 72 hours. Sepsis Labs: No results for input(s): PROCALCITON, LATICACIDVEN in the last 168 hours.  No results found for this or any previous visit (from the past 240 hours).        Radiology Studies: No results found.      Scheduled Meds:  apixaban   5 mg Oral BID   arformoterol   15 mcg Nebulization BID   hydrocerin   Topical BID   levothyroxine   125 mcg Oral Q0600   metoprolol  succinate  75 mg Oral Daily   revefenacin   175 mcg Nebulization Daily   torsemide   40 mg Oral Daily   Continuous Infusions:        Garrett Mao, MD [facility] 02/20/2024, 7:49 AM   "

## 2024-02-20 NOTE — Progress Notes (Signed)
 Physical Therapy Treatment Patient Details Name: [name] MRN: [id number] DOB: May 10, 1950 Today's Date: 02/20/2024   History of Present Illness Patient is 73y.o. male admitted from home after a fall from his wheelchair and was down for ~8 hours. Reported some leg swelling and SOB. CT chest, abdomen and pelvis showed intramuscular hematoma in the anterior compartment of R thigh. Other significant PMH includes morbid obesity, L AKA, A-fib, hypothyroidism, GERD, headaches, CHF.    PT Comments  Pt seen for therapy session to work on sitting balance and sit<>stand transfers for RLE strengthening. MAX A to sit up to EOB which has improved from last week. Good sitting balance with RLE supported on floor. Completed x3 sit<>stands at MOD A +2 or MAX A +1, recliner backrest provided for support once standing. Pt demonstrates difficulty getting both hands onto back rest when powering to standing after initial push off from bed surface. He reports increased arthritic pain in hands and shoulders which likely limited session.  Able to bring LUE on to recliner, however RUE remained on bed handrail. PT provided blocking of R knee to aid in achieving terminal knee extension on last attempt however pt still unable to get BUE onto recliner and bring hips fully underneath hip. Tolerates semi-standing position for 3-12sec between 3 attempts. Assisted back to supine, DEP for pericare after small incontinent BM. Pt continues to demonstrate functional gains and require less physical assistance overall. He will continue to benefit from skilled therapy while in acute hospital; PT continues to recommend follow up therapy at SNF following discharge.    If plan is discharge home, recommend the following: Assist for transportation;Two people to help with walking and/or transfers;Two people to help with bathing/dressing/bathroom;Help with stairs or ramp for entrance   Can travel by private vehicle     No  Equipment  Recommendations  None recommended by PT    Recommendations for Other Services       Precautions / Restrictions Precautions Precautions: Fall Recall of Precautions/Restrictions: Intact Precaution/Restrictions Comments: watch O2/HR, old L AKA Restrictions Weight Bearing Restrictions Per Provider Order: No     Mobility  Bed Mobility Overal bed mobility: Needs Assistance Bed Mobility: Supine to Sit, Sit to Supine, Rolling Rolling: Used rails, Max assist   Supine to sit: Max assist, HOB elevated, Used rails Sit to supine: Min assist, HOB elevated, Used rails   General bed mobility comments: MAX A for rollings with use of bed features while second person managed bed linens and completed pericare. Supine>sit improved to MAX A +1 with HOB elevated and use of bedrail, additional MAX A +1 for scooting to EOB. MIN A for sit>supine to assist RLE onto bed surface with pt using bedrail and HOB elevated    Transfers Overall transfer level: Needs assistance Equipment used:  (backrest of recliner) Transfers: Sit to/from Stand Sit to Stand: Mod assist, Max assist, +2 physical assistance           General transfer comment: x3 sit<>stands from elevated bed surface with MOD A +2 or MAX A +1, benefits from blocking of R knee to help achieve total extension. Tolerates standing 3-12sec. Uses BUE to help push off of bed surface, only able to get LUE onto back rest of recliner. Built up surface with blankets and person slide board for better surface for patient to push up on with L hand.    Ambulation/Gait               General Gait Details: non  amb   Stairs             Wheelchair Mobility     Tilt Bed    Modified Rankin (Stroke Patients Only)       Balance Overall balance assessment: Needs assistance, History of Falls Sitting-balance support: Feet supported, Bilateral upper extremity supported Sitting balance-Leahy Scale: Good Sitting balance - Comments: able to  maintain sitting balance with RLE support well, positioned at EOB for at least 15 minutes   Standing balance support: Bilateral upper extremity supported, During functional activity, Reliant on assistive device for balance Standing balance-Leahy Scale: Poor Standing balance comment: use of back of recliner for stability                            Communication Communication Communication: No apparent difficulties Factors Affecting Communication: Hearing impaired  Cognition Arousal: Alert Behavior During Therapy: WFL for tasks assessed/performed   PT - Cognitive impairments: No apparent impairments                         Following commands: Impaired Following commands impaired: Follows multi-step commands with increased time    Cueing Cueing Techniques: Verbal cues  Exercises      General Comments General comments (skin integrity, edema, etc.): discussed UE strength and exercises, energy conservation for therapy sessions and body mechanics with mobility      Pertinent Vitals/Pain Pain Assessment Pain Assessment: Faces Faces Pain Scale: Hurts little more Pain Location: shoulders and hands from arthritis Pain Descriptors / Indicators: Cramping, Grimacing, Discomfort Pain Intervention(s): Monitored during session, Limited activity within patient's tolerance    Home Living                          Prior Function            PT Goals (current goals can now be found in the care plan section) Acute Rehab PT Goals Patient Stated Goal: get back home by myself PT Goal Formulation: With patient Time For Goal Achievement: 03/05/24 Potential to Achieve Goals: Fair Progress towards PT goals: Progressing toward goals;Goals updated    Frequency    Min 2X/week      PT Plan      Co-evaluation              AM-PAC PT 6 Clicks Mobility   Outcome Measure  Help needed turning from your back to your side while in a flat bed without using  bedrails?: A Lot Help needed moving from lying on your back to sitting on the side of a flat bed without using bedrails?: Total Help needed moving to and from a bed to a chair (including a wheelchair)?: Total Help needed standing up from a chair using your arms (e.g., wheelchair or bedside chair)?: Total Help needed to walk in hospital room?: Total Help needed climbing 3-5 steps with a railing? : Total 6 Click Score: 7    End of Session Equipment Utilized During Treatment: Oxygen;Other (comment) (recliner) Activity Tolerance: Patient tolerated treatment well Patient left: in bed;with call bell/phone within reach;with bed alarm set Nurse Communication: Mobility status PT Visit Diagnosis: Muscle weakness (generalized) (M62.81);History of falling (Z91.81);Other abnormalities of gait and mobility (R26.89)     Time: 8640-8557 PT Time Calculation (min) (ACUTE ONLY): 43 min  Charges:    $Therapeutic Activity: 38-52 mins  Isaiah DEL. Melora Menon, PT, DPT   [employer] 02/20/2024, 2:49 PM

## 2024-02-21 NOTE — Progress Notes (Signed)
 Pt has really bad sore on his nose from bipap machine. RT placed mepilex over sore, pt later called and stated the pressure of the bipap moves the mask up and down on sore making it intolerable . RT placed more mepilex over sore and switched mask to see if that helps. RT will continue to monitor as needed.

## 2024-02-21 NOTE — Progress Notes (Signed)
" °   02/21/24 2331  BiPAP/CPAP/SIPAP  BiPAP/CPAP/SIPAP Pt Type Adult  BiPAP/CPAP/SIPAP SERVO  Mask Type Full face mask  Dentures removed? Not applicable  Mask Size Large  Set Rate 16 breaths/min  Respiratory Rate 25 breaths/min  IPAP 18 cmH20  EPAP 6 cmH2O  Pressure Support 12 cmH20  PEEP 6 cmH20  FiO2 (%) (S)  40 % (sat was a little low; bumped FIO2 up to 40%)  Minute Ventilation 8.6  Leak 86  Peak Inspiratory Pressure (PIP) 17  Tidal Volume (Vt) 374  Patient Home Machine No  Patient Home Mask No  Patient Home Tubing No  Auto Titrate No  Press High Alarm 30 cmH2O  Device Plugged into RED Power Outlet Yes  Oxygen Percent 40 %  BiPAP/CPAP /SiPAP Vitals  Pulse Rate 94  SpO2 91 %  Bilateral Breath Sounds Clear;Diminished  MEWS Score/Color  MEWS Score 0  MEWS Score Color Green    "

## 2024-02-21 NOTE — TOC Progression Note (Signed)
 Transition of Care [facility]) - Progression Note    Patient Details  Name: [name] MRN: [id number] Date of Birth: 08-18-1950  [facility] CM/SW Contact  Kepler Mccabe, Nathanel, RN Phone Number: 02/21/2024, 12:13 PM  Clinical Narrative:   Received call from [facility]  rep Christy-unable to accept(she noted the wt after acceptance) unable to provide care. LVM w/rep for [facility] [place]-await call back since accepted in hub(noted on voicemail wt)await call back.    Expected Discharge Plan: Skilled Nursing Facility Barriers to Discharge: Continued Medical Work up               Expected Discharge Plan and Services   Discharge Planning Services: CM Consult Post Acute Care Choice: IP Rehab Living arrangements for the past 2 months: Single Family Home                 DME Arranged: Other see comment [facility] rep Ashley-[facility]-approved & ready to deliver to facility @ d/c.) DME Agency: Other - Comment ([facility]-rep Rosina) Date DME Agency Contacted: 02/17/24 Time DME Agency Contacted: [phone] Representative spoke with at DME Agency: Rosina             Social Drivers of Health (SDOH) Interventions SDOH Screenings   Food Insecurity: No Food Insecurity (02/21/2024)  Housing: Unknown (02/21/2024)  Transportation Needs: No Transportation Needs (02/21/2024)  Utilities: Not At Risk (02/21/2024)  Financial Resource Strain: Low Risk (04/13/2023)   Received from [facility]  Physical Activity: Unknown (04/13/2023)   Received from [facility]  Social Connections: Moderately Integrated (02/21/2024)  Stress: Stress Concern Present (04/13/2023)   Received from [facility]  Tobacco Use: Low Risk (01/31/2024)    Readmission Risk Interventions     No data to display

## 2024-02-21 NOTE — Progress Notes (Signed)
 Patient requested to go on Bipap at 11 pm tonight.  Will place patient on machine at that time.

## 2024-02-21 NOTE — Progress Notes (Addendum)
 Occupational Therapy Treatment Patient Details Name: [name] MRN: [id number] DOB: 1950/11/25 Today's Date: 02/21/2024   History of present illness Patient is 73y.o. male admitted from home after a fall from his wheelchair and was down for ~8 hours. Reported some leg swelling and SOB. CT chest, abdomen and pelvis showed intramuscular hematoma in the anterior compartment of R thigh. Other significant PMH includes morbid obesity, L AKA, A-fib, hypothyroidism, GERD, headaches, CHF.   OT comments  Patient seen for skilled OT session as cotx with PT to progress strength and function for ADL's and mobility. Patient continues to require significant assist for supine to sit and all LB ADL's. Excellent motivation this session to progress scoots laterally and sitting balance with UE therex integrating pacing, breathing and safety.  Will need rehab prior to home as patient has limited support and lives alone. Patient will benefit from continued inpatient follow up therapy, <3 hours/day. Patient requires continued Acute care hospital level OT services to progress safety and functional performance and allow for discharge.        If plan is discharge home, recommend the following:  A lot of help with bathing/dressing/bathroom;Assistance with cooking/housework;Assist for transportation;Help with stairs or ramp for entrance;Two people to help with walking and/or transfers   Equipment Recommendations  Hospital bed;Hoyer lift;BSC/3in1 [facility])       Precautions / Restrictions Precautions Precautions: Fall Recall of Precautions/Restrictions: Intact Precaution/Restrictions Comments: watch O2/HR, old L AKA Restrictions Weight Bearing Restrictions Per Provider Order: No       Mobility Bed Mobility Overal bed mobility: Needs Assistance Bed Mobility: Supine to Sit, Sit to Supine, Rolling Rolling: Used rails, Max assist   Supine to sit: Mod assist, +2 for physical assistance, +2 for  safety/equipment, HOB elevated, Used rails Sit to supine: Min assist, +2 for physical assistance, +2 for safety/equipment, HOB elevated, Used rails   General bed mobility comments: MAX A with bedrails to roll for repositioning of drawsheets. MOD A +2 to sit to EOB, continued shoulder pain limits pt's assistance during session, MIN A +2 to return to supine    Transfers Overall transfer level:  (worked on transfer components EOB to build up skills)                       Balance Overall balance assessment: Needs assistance, History of Falls Sitting-balance support: Feet supported, Bilateral upper extremity supported Sitting balance-Leahy Scale: Good Sitting balance - Comments: maintains sitting balance throughout session at [facility] for balloon taps reaching outside BOS, and while completing lateral scooting along EOB                                   ADL either performed or assessed with clinical judgement   ADL Overall ADL's : Needs assistance/impaired Eating/Feeding: Modified independent;Sitting   Grooming: Wash/dry hands;Wash/dry face;Oral care;Modified independent;Sitting   Upper Body Bathing: Set up;Sitting   Lower Body Bathing: Maximal assistance;Sitting/lateral leans   Upper Body Dressing : Moderate assistance;Sitting   Lower Body Dressing: Maximal assistance;Sitting/lateral leans Lower Body Dressing Details (indicate cue type and reason): poor functional reach beyond waist level   Toilet Transfer Details (indicate cue type and reason): would need DABDC Toileting- Clothing Manipulation and Hygiene: Total assistance;Bed level Toileting - Clothing Manipulation Details (indicate cue type and reason): unable to reach buttocks for hygiene     Functional mobility during ADLs: +2 for physical assistance;+2 for safety/equipment;Minimal assistance;Moderate  assistance;Wheelchair General ADL Comments: improving with overall actiivty tolerance EOB and bed level  but continues to require rests, assist for all UE/LB dressing, toileting and bathing tasks due to body habitus and activity tolerance, able to perform grooming and self feeding mod I    Extremity/Trunk Assessment Upper Extremity Assessment Upper Extremity Assessment: Generalized weakness;Right hand dominant   Lower Extremity Assessment Lower Extremity Assessment: Defer to PT evaluation                 Communication Communication Communication: No apparent difficulties Factors Affecting Communication: Hearing impaired   Cognition Arousal: Alert Behavior During Therapy: WFL for tasks assessed/performed Cognition: Cognition impaired             OT - Cognition Comments: mild delayed processing wiht novel or increased information or directions especially during TB transfer when stressful task                 Following commands: Impaired Following commands impaired: Follows multi-step commands with increased time      Cueing   Cueing Techniques: Verbal cues, Tactile cues, Visual cues  Exercises Exercises: Other exercises (issued L1 and L2 tband for tricep presses between visits, patient able to complete 3 sets of 10 reps post transfer training with no SOB and sats remaining >94% on 3 ltrs of O2)       General Comments on 4L O2 during session, sats >90%, increased RR noted with activity scooting along EOB requiring cues and education for breathing and adequate rest breaks    Pertinent Vitals/ Pain       Pain Assessment Pain Assessment: Faces Faces Pain Scale: Hurts little more Pain Location: bilateral shoulders and hands secondary to arthritis Pain Descriptors / Indicators: Cramping, Grimacing, Discomfort Pain Intervention(s): Monitored during session, Repositioned   Frequency  Min 2X/week        Progress Toward Goals  OT Goals(current goals can now be found in the care plan section)  Progress towards OT goals: Progressing toward goals     Plan       Co-evaluation      Reason for Co-Treatment: For patient/therapist safety PT goals addressed during session: Mobility/safety with mobility;Balance;Strengthening/ROM OT goals addressed during session: Strengthening/ROM;ADL's and self-care      AM-PAC OT 6 Clicks Daily Activity     Outcome Measure   Help from another person eating meals?: None Help from another person taking care of personal grooming?: None Help from another person toileting, which includes using toliet, bedpan, or urinal?: A Lot Help from another person bathing (including washing, rinsing, drying)?: A Lot Help from another person to put on and taking off regular upper body clothing?: A Little Help from another person to put on and taking off regular lower body clothing?: A Lot 6 Click Score: 17    End of Session Equipment Utilized During Treatment: Oxygen  OT Visit Diagnosis: Unsteadiness on feet (R26.81);Other abnormalities of gait and mobility (R26.89);History of falling (Z91.81);Muscle weakness (generalized) (M62.81);Cognitive communication deficit (R41.841);Pain Pain - Right/Left: Left (right) Pain - part of body: Shoulder   Activity Tolerance Patient tolerated treatment well   Patient Left with call bell/phone within reach;in bed   Nurse Communication Mobility status        Time: 1500-1530 OT Time Calculation (min): 30 min  Charges: OT General Charges $OT Visit: 1 Visit OT Treatments $Therapeutic Activity: 8-22 mins  [name] OT/L Acute Rehabilitation Department  [phone]  02/21/2024, 5:06 PM

## 2024-02-21 NOTE — Progress Notes (Signed)
 " PROGRESS NOTE    [name]  FMW:[id number] DOB: 11-15-1950 DOA: 01/31/2024 PCP: Pura Lenis, MD   Brief Narrative:  74 y.o. male with medical history significant for morbid obesity, A-fib on Eliquis , OSA, status post left AKA, hypothyroidism, chronic systolic heart failure and DVT presented after a fall.  On presentation, he was hypoxic with chest x-ray showing vascular congestion and required BiPAP and diuresis.  He was also found to have intramuscular hematoma in the anterior compartment of the proximal left thigh.  Subsequently, his respiratory status has improved.  PCCM has signed off.  PT recommended SNF placement.  He is currently medically stable for discharge but awaiting safe disposition.  TOC following.  Assessment & Plan:   Acute respiratory failure with hypoxia and hypercapnia - Continue at bedtime BiPAP even on discharge.  Currently on 3-4 L oxygen via nasal cannula. -PCCM has already signed off.  Outpatient follow-up with CCM.  Continue current nebs.  Acute on chronic systolic CHF - Currently compensated.  Continue torsemide , metoprolol  succinate.  Strict input and output.  Daily weights.  Fluid restriction.  Paroxysmal A-fib - Rate mostly controlled.  Continue Eliquis  and metoprolol  succinate  Fall -Out of his wheelchair. -He is currently medically stable for discharge but awaiting safe disposition.  TOC following.  Obesity class III - Outpatient follow-up  Intramuscular hematoma in the anterior compartment of the proximal left thigh - Due to fall.  Xarelto  has already been resumed  Ulcer of extremity due to chronic venous insufficiency of right leg - Continue local wound care.  Hypophosphatemia - Resolved  Thrombocytopenia - Resolved  Leukopenia - No recent labs  Anemia of chronic disease Macrocytosis - From chronic illnesses.  Hemoglobin stable.  Monitor intermittently   DVT prophylaxis: Eliquis  Code Status: Full Family Communication: None  at bedside Disposition Plan: Status is: Inpatient Remains inpatient appropriate because: Of severity of illness    Consultants: PCCM  Procedures: As above  Antimicrobials: None currently   Subjective: Patient seen and examined at bedside.  No fever, worsening shortness of breath, vomiting reported.   Objective: Vitals:   02/20/24 1934 02/20/24 1935 02/21/24 0417 02/21/24 0430  BP: 122/67  116/77   Pulse: 80  79   Resp: 17  16   Temp: 98 F (36.7 C)  98 F (36.7 C)   TempSrc:      SpO2: 92% 92% 95%   Weight:    (!) 187.4 kg  Height:        Intake/Output Summary (Last 24 hours) at 02/21/2024 0751 Last data filed at 02/21/2024 0746 Gross per 24 hour  Intake 240 ml  Output 3050 ml  Net -2810 ml   Filed Weights   02/19/24 0500 02/20/24 0446 02/21/24 0430  Weight: (!) 187 kg (!) 187.7 kg (!) 187.4 kg    Examination:  General: Remains on 4 L oxygen.  In no distress currently.  Chronically ill and deconditioned appropriate respiratory: Bilateral decreased breath sounds at bases with scattered crackles  CVS: S1 and S2 heard; rate mostly controlled  abdominal: Soft, morbidly obese, nontender, still distended; no organomegaly; bowel sounds are heard normally extremities: Left AKA present  Data Reviewed: I have personally reviewed following labs and imaging studies  CBC: No results for input(s): WBC, NEUTROABS, HGB, HCT, MCV, PLT in the last 168 hours. Basic Metabolic Panel: No results for input(s): NA, K, CL, CO2, GLUCOSE, BUN, CREATININE, CALCIUM , MG, PHOS in the last 168 hours.  GFR: Estimated Creatinine Clearance: 118.6 mL/min (  by C-G formula based on SCr of 0.91 mg/dL). Liver Function Tests: No results for input(s): AST, ALT, ALKPHOS, BILITOT, PROT, ALBUMIN  in the last 168 hours. No results for input(s): LIPASE, AMYLASE in the last 168 hours. No results for input(s): AMMONIA in the last 168 hours. Coagulation  Profile: No results for input(s): INR, PROTIME in the last 168 hours. Cardiac Enzymes: No results for input(s): CKTOTAL, CKMB, CKMBINDEX, TROPONINI in the last 168 hours. BNP (last 3 results) Recent Labs    01/31/24 1436  PROBNP 1,218.0*   HbA1C: No results for input(s): HGBA1C in the last 72 hours. CBG: No results for input(s): GLUCAP in the last 168 hours.  Lipid Profile: No results for input(s): CHOL, HDL, LDLCALC, TRIG, CHOLHDL, LDLDIRECT in the last 72 hours. Thyroid  Function Tests: No results for input(s): TSH, T4TOTAL, FREET4, T3FREE, THYROIDAB in the last 72 hours. Anemia Panel: No results for input(s): VITAMINB12, FOLATE, FERRITIN, TIBC, IRON, RETICCTPCT in the last 72 hours. Sepsis Labs: No results for input(s): PROCALCITON, LATICACIDVEN in the last 168 hours.  No results found for this or any previous visit (from the past 240 hours).        Radiology Studies: No results found.      Scheduled Meds:  apixaban   5 mg Oral BID   arformoterol   15 mcg Nebulization BID   hydrocerin   Topical BID   levothyroxine   125 mcg Oral Q0600   metoprolol  succinate  75 mg Oral Daily   revefenacin   175 mcg Nebulization Daily   torsemide   40 mg Oral Daily   Continuous Infusions:        Sophie Mao, MD [facility] 02/21/2024, 7:51 AM   "

## 2024-02-21 NOTE — TOC Progression Note (Addendum)
 Transition of Care [facility]) - Progression Note    Patient Details  Name: [name] MRN: [id number] Date of Birth: December 31, 1950  [facility]) CM/SW Contact  Josua Ferrebee, Nathanel, RN Phone Number: 02/21/2024, 12:32 PM  Clinical Narrative: No facility to accept for ST SNF. Will discuss home w/HHC if appropriate.  -3:30p- Referral sent for HHC-HHPT/OT/aide-await agency.PTAR @ d/c.    Expected Discharge Plan: Skilled Nursing Facility Barriers to Discharge: Continued Medical Work up               Expected Discharge Plan and Services   Discharge Planning Services: CM Consult Post Acute Care Choice: IP Rehab Living arrangements for the past 2 months: Single Family Home                 DME Arranged: Other see comment [facility] rep [facility]-NIV-approved & ready to deliver to facility @ d/c.) DME Agency: Other - Comment ([facility]-rep Rosina) Date DME Agency Contacted: 02/17/24 Time DME Agency Contacted: [phone] Representative spoke with at DME Agency: Rosina             Social Drivers of Health (SDOH) Interventions SDOH Screenings   Food Insecurity: No Food Insecurity (02/21/2024)  Housing: Unknown (02/21/2024)  Transportation Needs: No Transportation Needs (02/21/2024)  Utilities: Not At Risk (02/21/2024)  Financial Resource Strain: Low Risk (04/13/2023)   Received from [facility]  Physical Activity: Unknown (04/13/2023)   Received from [facility]  Social Connections: Moderately Integrated (02/21/2024)  Stress: Stress Concern Present (04/13/2023)   Received from [facility]  Tobacco Use: Low Risk (01/31/2024)    Readmission Risk Interventions     No data to display

## 2024-02-21 NOTE — TOC Progression Note (Signed)
 Transition of Care [facility]) - Progression Note    Patient Details  Name: [name] MRN: [id number] Date of Birth: 06-12-1950  Transition of Care [facility]) CM/SW Contact  Jaman Aro, Nathanel, RN Phone Number: 02/21/2024, 10:20 AM  Clinical Narrative: [facility] [place] rep Christy aware. Reeda shara shara pi#[id number] auth. [facility] rep Rosina already has NIV just awaiting to deliver to facility @ d/c.      Expected Discharge Plan: Skilled Nursing Facility Barriers to Discharge: English As A Second Language Teacher               Expected Discharge Plan and Services   Discharge Planning Services: CM Consult Post Acute Care Choice: IP Rehab Living arrangements for the past 2 months: Single Family Home                 DME Arranged: Other see comment [facility]-NIV-approved & ready to deliver to facility @ d/c.) DME Agency: Other - Comment ([facility]rep Ros[facility]) Date DME Agency Contacted: 02/17/24 Time DME Agency Contacted: [phone] Representative spoke with at DME Agency: Rosina             Social Drivers of Health (SDOH) Interventions SDOH Screenings   Food Insecurity: No Food Insecurity (02/21/2024)  Housing: Unknown (02/21/2024)  Transportation Needs: No Transportation Needs (02/21/2024)  Utilities: Not At Risk (02/21/2024)  Financial Resource Strain: Low Risk (04/13/2023)   Received from [facility]  Physical Activity: Unknown (04/13/2023)   Received from [facility]  Social Connections: Moderately Integrated (02/21/2024)  Stress: Stress Concern Present (04/13/2023)   Received from [facility]  Tobacco Use: Low Risk (01/31/2024)    Readmission Risk Interventions     No data to display

## 2024-02-21 NOTE — Progress Notes (Signed)
 Physical Therapy Treatment Patient Details Name: [name] MRN: [id number] DOB: 12-14-1950 Today's Date: 02/21/2024   History of Present Illness Patient is 74y.o. male admitted from home after a fall from his wheelchair and was down for ~8 hours. Reported some leg swelling and SOB. CT chest, abdomen and pelvis showed intramuscular hematoma in the anterior compartment of R thigh. Other significant PMH includes morbid obesity, L AKA, A-fib, hypothyroidism, GERD, headaches, CHF.    PT Comments  Pt seen with OT for co-treat to work on functional seated balance and strengthening for transfers. Tolerates sitting at EOB for 20+ minutes at [facility] completing balloons taps with OT followed by lateral scooting along EOB for strengthening to aid in slide board transfers. Pt benefits from demonstration of body mechanic movements to help with adequate scooting and balance. Pt continues to demonstrate functional improvements with less assistance overall for bed mobility and transfers. However, he continues to require +2 assistance for all mobility and is unsafe to return home at this time as he does not have adequate support. PT continues to recommend SNF for functional gains with strength, endurance, mobility and overall independence.     If plan is discharge home, recommend the following: Assist for transportation;Two people to help with walking and/or transfers;Two people to help with bathing/dressing/bathroom;Help with stairs or ramp for entrance;Assistance with cooking/housework   Can travel by private vehicle     No  Equipment Recommendations  None recommended by PT    Recommendations for Other Services       Precautions / Restrictions Precautions Precautions: Fall Recall of Precautions/Restrictions: Intact Precaution/Restrictions Comments: watch O2/HR, old L AKA Restrictions Weight Bearing Restrictions Per Provider Order: No     Mobility  Bed Mobility Overal bed mobility: Needs  Assistance Bed Mobility: Supine to Sit, Sit to Supine, Rolling Rolling: Used rails, Max assist   Supine to sit: Mod assist, +2 for physical assistance, +2 for safety/equipment, HOB elevated, Used rails Sit to supine: Min assist, +2 for physical assistance, +2 for safety/equipment, HOB elevated, Used rails   General bed mobility comments: MAX A with bedrails to roll for repositioning of drawsheets. MOD A +2 to sit to EOB, continued shoulder pain limits pt's assistance during session, MIN A +2 to return to supine    Transfers                        Ambulation/Gait                   Stairs             Wheelchair Mobility     Tilt Bed    Modified Rankin (Stroke Patients Only)       Balance Overall balance assessment: Needs assistance, History of Falls Sitting-balance support: Feet supported, Bilateral upper extremity supported Sitting balance-Leahy Scale: Good Sitting balance - Comments: maintains sitting balance throughout session at [facility] for balloon taps reaching outside BOS, and while completing lateral scooting along EOB                                    Communication Communication Communication: No apparent difficulties Factors Affecting Communication: Hearing impaired  Cognition Arousal: Alert Behavior During Therapy: WFL for tasks assessed/performed   PT - Cognitive impairments: No apparent impairments  Following commands impaired: Follows multi-step commands with increased time    Cueing Cueing Techniques: Verbal cues, Tactile cues, Visual cues  Exercises      General Comments General comments (skin integrity, edema, etc.): on 4L O2 during session, sats >90%, increased RR noted with activity scooting along EOB requiring cues and education for breathing and adequate rest breaks      Pertinent Vitals/Pain Pain Assessment Pain Assessment: Faces Faces Pain Scale: Hurts little  more Pain Location: bilateral shoulders and hands secondary to arthritis Pain Descriptors / Indicators: Cramping, Grimacing, Discomfort Pain Intervention(s): Monitored during session, Limited activity within patient's tolerance, Repositioned    Home Living                          Prior Function            PT Goals (current goals can now be found in the care plan section) Acute Rehab PT Goals Patient Stated Goal: get back home by myself PT Goal Formulation: With patient Time For Goal Achievement: 03/05/24 Potential to Achieve Goals: Fair Progress towards PT goals: Progressing toward goals    Frequency    Min 3X/week      PT Plan      Co-evaluation PT/OT/SLP Co-Evaluation/Treatment: Yes Reason for Co-Treatment: For patient/therapist safety PT goals addressed during session: Mobility/safety with mobility;Balance;Strengthening/ROM OT goals addressed during session: Strengthening/ROM;ADL's and self-care      AM-PAC PT 6 Clicks Mobility   Outcome Measure  Help needed turning from your back to your side while in a flat bed without using bedrails?: A Lot Help needed moving from lying on your back to sitting on the side of a flat bed without using bedrails?: Total Help needed moving to and from a bed to a chair (including a wheelchair)?: Total Help needed standing up from a chair using your arms (e.g., wheelchair or bedside chair)?: Total Help needed to walk in hospital room?: Total Help needed climbing 3-5 steps with a railing? : Total 6 Click Score: 7    End of Session Equipment Utilized During Treatment: Oxygen Activity Tolerance: Patient tolerated treatment well;Patient limited by pain (continued shoulder and wrist/finger pain due to arthritis) Patient left: in bed;with call bell/phone within reach;with bed alarm set Nurse Communication: Mobility status PT Visit Diagnosis: Muscle weakness (generalized) (M62.81);History of falling (Z91.81);Other  abnormalities of gait and mobility (R26.89)     Time: 1500-1531 PT Time Calculation (min) (ACUTE ONLY): 31 min  Charges:    $Therapeutic Activity: 8-22 mins                       Isaiah DEL. Nasim Garofano, PT, DPT   [employer] 02/21/2024, 3:49 PM

## 2024-02-22 DIAGNOSIS — L98499 Non-pressure chronic ulcer of skin of other sites with unspecified severity: Secondary | ICD-10-CM | POA: Diagnosis not present

## 2024-02-22 DIAGNOSIS — S78112A Complete traumatic amputation at level between left hip and knee, initial encounter: Secondary | ICD-10-CM

## 2024-02-22 DIAGNOSIS — E039 Hypothyroidism, unspecified: Secondary | ICD-10-CM | POA: Diagnosis not present

## 2024-02-22 DIAGNOSIS — J9602 Acute respiratory failure with hypercapnia: Secondary | ICD-10-CM | POA: Diagnosis not present

## 2024-02-22 DIAGNOSIS — G4733 Obstructive sleep apnea (adult) (pediatric): Secondary | ICD-10-CM | POA: Diagnosis not present

## 2024-02-22 DIAGNOSIS — W19XXXS Unspecified fall, sequela: Secondary | ICD-10-CM | POA: Diagnosis not present

## 2024-02-22 DIAGNOSIS — T148XXA Other injury of unspecified body region, initial encounter: Secondary | ICD-10-CM | POA: Diagnosis not present

## 2024-02-22 DIAGNOSIS — J9601 Acute respiratory failure with hypoxia: Secondary | ICD-10-CM | POA: Diagnosis not present

## 2024-02-22 DIAGNOSIS — I10 Essential (primary) hypertension: Secondary | ICD-10-CM | POA: Diagnosis not present

## 2024-02-22 DIAGNOSIS — I5023 Acute on chronic systolic (congestive) heart failure: Secondary | ICD-10-CM | POA: Diagnosis not present

## 2024-02-22 DIAGNOSIS — Z6841 Body Mass Index (BMI) 40.0 and over, adult: Secondary | ICD-10-CM | POA: Diagnosis not present

## 2024-02-22 LAB — CBC WITH DIFFERENTIAL/PLATELET
Abs Immature Granulocytes: 0.01 K/uL (ref 0.00–0.07)
Basophils Absolute: 0 K/uL (ref 0.0–0.1)
Basophils Relative: 1 %
Eosinophils Absolute: 0.1 K/uL (ref 0.0–0.5)
Eosinophils Relative: 3 %
HCT: 31.5 % — ABNORMAL LOW (ref 39.0–52.0)
Hemoglobin: 9.3 g/dL — ABNORMAL LOW (ref 13.0–17.0)
Immature Granulocytes: 0 %
Lymphocytes Relative: 26 %
Lymphs Abs: 0.6 K/uL — ABNORMAL LOW (ref 0.7–4.0)
MCH: 30.8 pg (ref 26.0–34.0)
MCHC: 29.5 g/dL — ABNORMAL LOW (ref 30.0–36.0)
MCV: 104.3 fL — ABNORMAL HIGH (ref 80.0–100.0)
Monocytes Absolute: 0.3 K/uL (ref 0.1–1.0)
Monocytes Relative: 13 %
Neutro Abs: 1.4 K/uL — ABNORMAL LOW (ref 1.7–7.7)
Neutrophils Relative %: 57 %
Platelets: 209 K/uL (ref 150–400)
RBC: 3.02 MIL/uL — ABNORMAL LOW (ref 4.22–5.81)
RDW: 18.4 % — ABNORMAL HIGH (ref 11.5–15.5)
WBC: 2.5 K/uL — ABNORMAL LOW (ref 4.0–10.5)
nRBC: 0 % (ref 0.0–0.2)

## 2024-02-22 LAB — BASIC METABOLIC PANEL WITH GFR
Anion gap: 7 (ref 5–15)
BUN: 15 mg/dL (ref 8–23)
CO2: 37 mmol/L — ABNORMAL HIGH (ref 22–32)
Calcium: 10.2 mg/dL (ref 8.9–10.3)
Chloride: 97 mmol/L — ABNORMAL LOW (ref 98–111)
Creatinine, Ser: 0.92 mg/dL (ref 0.61–1.24)
GFR, Estimated: >60 mL/min
Glucose, Bld: 99 mg/dL (ref 70–99)
Potassium: 3.6 mmol/L (ref 3.5–5.1)
Sodium: 141 mmol/L (ref 135–145)

## 2024-02-22 LAB — MAGNESIUM: Magnesium: 2.2 mg/dL (ref 1.7–2.4)

## 2024-02-22 NOTE — TOC Progression Note (Addendum)
 Transition of Care [facility]) - Progression Note    Patient Details  Name: [name] MRN: [id number] Date of Birth: Sep 11, 1950  [facility]) CM/SW Contact  Mieka Leaton, Nathanel, RN Phone Number: 02/22/2024, 8:42 AM  Clinical Narrative: patient has already been denied AIR even after P2P-patient declined fast track appeal;No ST SNF to accept-issues are:not pivoting, mod-max asst +2, wt is not much of the concern since most SNF can take over 400lbs. May need PT to continue to work with patient. HHC is always an option-HHC agency has accepted-he says he has asst @ home.  -8:54a  Awaiting a call back from one of the ST SNF-since I did receive auth  -1:40p-[facility], [facility], [facility] rep christy-unable to accept. Await call back from [facility] rep Christy -3:52p-await call back from the last ST SNF that I can check  into [facility]-await if can accept. Received auth already till 1/15.   Expected Discharge Plan: Skilled Nursing Facility Barriers to Discharge: Continued Medical Work up               Expected Discharge Plan and Services   Discharge Planning Services: CM Consult Post Acute Care Choice: IP Rehab Living arrangements for the past 2 months: Single Family Home                 DME Arranged: Other see comment [facility] rep Ashley-NIV-approved & ready to deliver to facility @ d/c.) DME Agency: Other - Comment ([facility]-rep Rosina) Date DME Agency Contacted: 02/17/24 Time DME Agency Contacted: [phone] Representative spoke with at DME Agency: Rosina             Social Drivers of Health (SDOH) Interventions SDOH Screenings   Food Insecurity: No Food Insecurity (02/21/2024)  Housing: Unknown (02/21/2024)  Transportation Needs: No Transportation Needs (02/21/2024)  Utilities: Not At Risk (02/21/2024)  Financial Resource Strain: Low Risk (04/13/2023)   Received from [facility]  Physical Activity: Unknown (04/13/2023)   Received from  [facility]  Social Connections: Moderately Integrated (02/21/2024)  Stress: Stress Concern Present (04/13/2023)   Received from [facility]  Tobacco Use: Low Risk (01/31/2024)    Readmission Risk Interventions     No data to display

## 2024-02-22 NOTE — Plan of Care (Signed)
" °  Problem: Education: Goal: Ability to demonstrate management of disease process will improve Outcome: Progressing Goal: Ability to verbalize understanding of medication therapies will improve Outcome: Progressing   Problem: Cardiac: Goal: Ability to achieve and maintain adequate cardiopulmonary perfusion will improve Outcome: Progressing   Problem: Education: Goal: Knowledge of General Education information will improve Description: Including pain rating scale, medication(s)/side effects and non-pharmacologic comfort measures Outcome: Progressing   Problem: Clinical Measurements: Goal: Ability to maintain clinical measurements within normal limits will improve Outcome: Progressing Goal: Respiratory complications will improve Outcome: Progressing   Problem: Activity: Goal: Risk for activity intolerance will decrease Outcome: Progressing   "

## 2024-02-22 NOTE — Progress Notes (Addendum)
 " PROGRESS NOTE    [name]  FMW:[id number] DOB: 1950-09-03 DOA: 01/31/2024 PCP: Pura Lenis, MD    Chief Complaint  Patient presents with   Fall    Brief Narrative:  74 y.o. male with medical history significant for morbid obesity, A-fib on Eliquis , OSA, status post left AKA, hypothyroidism, chronic systolic heart failure and DVT presented after a fall.  On presentation, he was hypoxic with chest x-ray showing vascular congestion and required BiPAP and diuresis.  He was also found to have intramuscular hematoma in the anterior compartment of the proximal left thigh.  Subsequently, his respiratory status has improved.  PCCM has signed off.  PT recommended SNF placement.  He is currently medically stable for discharge but awaiting safe disposition.  TOC following.    Assessment & Plan:   Principal Problem:   Acute respiratory failure with hypoxia and hypercapnia (HCC) Active Problems:   Acute on chronic systolic CHF (congestive heart failure) (HCC)   Fall   Morbid obesity with BMI of 70 and over, adult (HCC)   Chronic anticoagulation - hx of PAF   Acquired hypothyroidism   Unilateral AKA, left (HCC)   Intramuscular hematoma   Essential hypertension   Wheelchair dependent   Ulcer of extremity due to chronic venous insufficiency (HCC) - right leg  #1 acute respiratory failure with hypoxia and hypercapnia/OSA - Patient seen in consultation by PCCM. - Patient noted to have OSA, OHS and CHF likely contributing to his acute on chronic hypercapnic and hypoxemic respiratory failure. - Patient placed on Brovana  and Yupelri  nebs per pulmonary. - Continue Demadex . - PCCM was following but has signed off. - Outpatient follow-up with pulmonary.  2.  Acute on chronic systolic heart failure -Patient noted to have received IV diuretics with good diuresis and clinical improvement - Currently on torsemide  and metoprolol  succinate. - Strict I's and O's, daily weights.  3.   Paroxysmal A-fib -Rate controlled on metoprolol  succinate.-Eliqui - Eliquis  for anticoagulation.  4.  Fall -Patient noted to have a fall out of his wheelchair. - Patient seen by PT/OT. - Patient currently medically stable awaiting safe disposition to SNF. - TOC following.  5.  Obesity class III -Lifestyle modification. - Outpatient follow-up. - May benefit from Zepbound which would also help with his OSA. - Outpatient follow-up.  6.  Intramuscular hematoma anterior compartment of proximal left thigh -Secondary to fall. - Anticoagulation has already been resumed. - Follow.  7.  Ulcer of extremity due to chronic venous insufficiency of the right leg -Patient seen by wound care RN. - Continue current local wound care.  8.  Hypophosphatemia -Repleted.  9.  Thrombocytopenia -Resolved.  10.  Anemia of chronic disease -H&H stable.  11.  Leukopenia -Follow.  DVT prophylaxis: Eliquis  Code Status: Full Family Communication: Updated patient.  No family at bedside. Disposition: Needs SNF placement.  Status is: Inpatient Remains inpatient appropriate because: Unsafe disposition   Consultants:  PCCM: Dr.Dewald 02/05/2024 Wound care RN  Procedures:  CT head 01/31/2024 CT C-spine 01/31/2024 CT chest abdomen and pelvis 01/31/2024 Chest x-ray 01/31/2024 2D echo 02/01/2024   Antimicrobials:  Anti-infectives (From admission, onward)    Start     Dose/Rate Route Frequency Ordered Stop   01/31/24 1700  cefTRIAXone  (ROCEPHIN ) 1 g in sodium chloride  0.9 % 100 mL IVPB  Status:  Discontinued        1 g 200 mL/hr over 30 Minutes Intravenous  Once 01/31/24 1656 01/31/24 1658   01/31/24 1700  cefTRIAXone  (  ROCEPHIN ) 2 g in sodium chloride  0.9 % 100 mL IVPB        2 g 200 mL/hr over 30 Minutes Intravenous  Once 01/31/24 1658 01/31/24 1908         Subjective: Patient laying in bed.  Denies any chest pain or worsening shortness of breath.  Denies any abdominal pain.  Overall  states he feels well.  Stated he used BiPAP machine overnight.  Objective: Vitals:   02/22/24 0457 02/22/24 0500 02/22/24 0535 02/22/24 0753  BP:   (!) 143/119   Pulse:   95   Resp: 20  18 19   Temp:   97.8 F (36.6 C)   TempSrc:      SpO2:   97% 90%  Weight:  (!) 199.3 kg    Height:        Intake/Output Summary (Last 24 hours) at 02/22/2024 1252 Last data filed at 02/22/2024 0650 Gross per 24 hour  Intake --  Output 1400 ml  Net -1400 ml   Filed Weights   02/20/24 0446 02/21/24 0430 02/22/24 0500  Weight: (!) 187.7 kg (!) 187.4 kg (!) 199.3 kg    Examination:  General exam: Appears calm and comfortable  Respiratory system: Clear to auscultation anterior lung fields.  No wheezes, no crackles, no rhonchi.SABRA Respiratory effort normal. Cardiovascular system: S1 & S2 heard, RRR. No JVD, murmurs, rubs, gallops or clicks. No pedal edema. Gastrointestinal system: Abdomen is obese, mildly distended, soft, nontender to palpation.  Positive bowel sounds.  No rebound.  No guarding.  Central nervous system: Alert and oriented. No focal neurological deficits. Extremities: Left AKA.  Symmetric 5 x 5 power. Skin: No rashes, lesions or ulcers Psychiatry: Judgement and insight appear normal. Mood & affect appropriate.     Data Reviewed: I have personally reviewed following labs and imaging studies  CBC: Recent Labs  Lab 02/22/24 0601  WBC 2.5*  NEUTROABS 1.4*  HGB 9.3*  HCT 31.5*  MCV 104.3*  PLT 209    Basic Metabolic Panel: Recent Labs  Lab 02/22/24 0601  NA 141  K 3.6  CL 97*  CO2 37*  GLUCOSE 99  BUN 15  CREATININE 0.92  CALCIUM  10.2  MG 2.2    GFR: Estimated Creatinine Clearance: 122.2 mL/min (by C-G formula based on SCr of 0.92 mg/dL).  Liver Function Tests: No results for input(s): AST, ALT, ALKPHOS, BILITOT, PROT, ALBUMIN  in the last 168 hours.  CBG: No results for input(s): GLUCAP in the last 168 hours.   No results found for this or  any previous visit (from the past 240 hours).       Radiology Studies: No results found.      Scheduled Meds:  apixaban   5 mg Oral BID   arformoterol   15 mcg Nebulization BID   hydrocerin   Topical BID   levothyroxine   125 mcg Oral Q0600   metoprolol  succinate  75 mg Oral Daily   revefenacin   175 mcg Nebulization Daily   torsemide   40 mg Oral Daily   Continuous Infusions:   LOS: 22 days    Time spent: 35 minutes    Toribio Hummer, MD [facility]   To contact the attending provider between 7A-7P or the covering provider during after hours 7P-7A, please log into the web site [email or web address] and access using universal [place] password for that web site. If you do not have the password, please call the hospital operator.  02/22/2024, 12:52 PM    "

## 2024-02-23 DIAGNOSIS — T148XXA Other injury of unspecified body region, initial encounter: Secondary | ICD-10-CM | POA: Diagnosis not present

## 2024-02-23 DIAGNOSIS — I10 Essential (primary) hypertension: Secondary | ICD-10-CM | POA: Diagnosis not present

## 2024-02-23 DIAGNOSIS — J9601 Acute respiratory failure with hypoxia: Secondary | ICD-10-CM | POA: Diagnosis not present

## 2024-02-23 DIAGNOSIS — I5023 Acute on chronic systolic (congestive) heart failure: Secondary | ICD-10-CM | POA: Diagnosis not present

## 2024-02-23 MED ORDER — TORSEMIDE 40 MG PO TABS: 40.0000 mg | ORAL_TABLET | Freq: Every day | ORAL | Status: AC

## 2024-02-23 MED ORDER — REVEFENACIN 175 MCG/3ML IN SOLN: 175.0000 ug | Freq: Every day | RESPIRATORY_TRACT | Status: AC

## 2024-02-23 MED ORDER — INFLUENZA VAC SPLIT HIGH-DOSE 0.5 ML IM SUSY
0.5000 mL | PREFILLED_SYRINGE | Freq: Once | INTRAMUSCULAR | Status: AC
  Administered 2024-02-23: 0.5 mL via INTRAMUSCULAR
  Filled 2024-02-23: qty 0.5

## 2024-02-23 MED ORDER — HYDROCERIN EX CREA: 1.0000 | TOPICAL_CREAM | Freq: Two times a day (BID) | CUTANEOUS | Status: AC

## 2024-02-23 MED ORDER — ARFORMOTEROL TARTRATE 15 MCG/2ML IN NEBU: 15.0000 ug | INHALATION_SOLUTION | Freq: Two times a day (BID) | RESPIRATORY_TRACT | Status: AC

## 2024-02-23 NOTE — Progress Notes (Signed)
" °   02/23/24 0007  BiPAP/CPAP/SIPAP  BiPAP/CPAP/SIPAP Pt Type Adult  BiPAP/CPAP/SIPAP SERVO  Mask Type Full face mask  Dentures removed? Not applicable  Mask Size Large  Set Rate 16 breaths/min  Respiratory Rate 30 breaths/min  IPAP 18 cmH20  EPAP 6 cmH2O  Pressure Support 12 cmH20  PEEP 6 cmH20  FiO2 (%) 40 %  Flow Rate 0 lpm  Minute Ventilation 12.1  Leak 89  Peak Inspiratory Pressure (PIP) 16  Tidal Volume (Vt) 437  Patient Home Machine No  Patient Home Mask No  Patient Home Tubing No  Auto Titrate No  Press High Alarm 30 cmH2O  Press Low Alarm 16 cmH2O  Nasal massage performed No (comment)  CPAP/SIPAP surface wiped down Yes  Device Plugged into RED Power Outlet Yes  Oxygen Percent 40 %    "

## 2024-02-23 NOTE — Progress Notes (Addendum)
 " PROGRESS NOTE    [name]  FMW:[id number] DOB: July 29, 1950 DOA: 01/31/2024 PCP: Pura Lenis, MD    Chief Complaint  Patient presents with   Fall    Brief Narrative:  74 y.o. male with medical history significant for morbid obesity, A-fib on Eliquis , OSA, status post left AKA, hypothyroidism, chronic systolic heart failure and DVT presented after a fall.  On presentation, he was hypoxic with chest x-ray showing vascular congestion and required BiPAP and diuresis.  He was also found to have intramuscular hematoma in the anterior compartment of the proximal left thigh.  Subsequently, his respiratory status has improved.  PCCM has signed off.  PT recommended SNF placement.  He is currently medically stable for discharge but awaiting safe disposition.  TOC following.    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 - Patient noted to have OSA, OHS and CHF likely contributing to his acute on chronic hypercapnic and hypoxemic respiratory failure. - Continue Brovana  and Yupelri  nebs per pulmonary. - Continue Demadex . -BiPAP nightly. - PCCM was following but has signed off. - Outpatient follow-up with pulmonary.  Mr. 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 Alarms and internal battery are required to ensure continuous support and immediate recognition of  ventilatory insufficiency. A RAD with backup rate is insufficient due to the severity of illness. Volume ventilation is needed to reduce work of breathing and fatigue and to prevent life threatening exacerbations. OSA is not the predominant cause of hypercapnia. PCO2 = 62 on ABG.   2.  Acute on chronic systolic heart failure -Patient noted to have received IV diuretics with good diuresis and clinical improvement - Currently on torsemide  and metoprolol  succinate. - Strict I's and O's, daily weights.  3.  Paroxysmal A-fib -Rate controlled on metoprolol  succinate. -Eliquis  for anticoagulation.  4.  Fall -Patient noted to have a fall out of his wheelchair. - Patient seen by PT/OT. - Patient currently medically stable awaiting safe disposition to SNF. - TOC following.  5.  Obesity class III -Lifestyle modification. - Outpatient follow-up. - May benefit from Zepbound which would also help with his OSA. - Outpatient follow-up.  6.  Intramuscular hematoma anterior compartment of proximal left thigh -Secondary to fall. - Anticoagulation has already been resumed. - Follow.  7.  Ulcer of extremity due to chronic venous insufficiency of the right leg -Patient seen by wound care RN. - Continue current local wound care.  8.  Hypophosphatemia -Repleted.  9.  Thrombocytopenia -Resolved.  10.  Anemia of chronic disease - Hemoglobin stable at 9.3.   - Follow H&H.  11.  Leukopenia -Follow.  DVT prophylaxis: Eliquis  Code Status: Full Family Communication: Updated patient.  No family at bedside. Disposition: SNF when bed available.    Status is: Inpatient Remains inpatient appropriate because: Unsafe disposition   Consultants:  PCCM: Dr.Dewald 02/05/2024 Wound care RN  Procedures:  CT head 01/31/2024 CT C-spine 01/31/2024 CT chest abdomen and pelvis 01/31/2024 Chest x-ray 01/31/2024 2D echo 02/01/2024   Antimicrobials:  Anti-infectives (From admission, onward)    Start      Dose/Rate Route Frequency Ordered Stop   01/31/24 1700  cefTRIAXone  (ROCEPHIN ) 1 g in sodium chloride  0.9 % 100 mL IVPB  Status:  Discontinued        1 g 200 mL/hr over 30 Minutes Intravenous  Once 01/31/24 1656 01/31/24 1658   01/31/24 1700  cefTRIAXone  (ROCEPHIN ) 2 g in sodium chloride  0.9 % 100 mL IVPB        2 g 200 mL/hr over 30 Minutes Intravenous  Once 01/31/24 1658 01/31/24 1908         Subjective: Patient lying in bed.  Denies any chest pain or worsening shortness of breath.  Tolerated BiPAP overnight.  Asking whether he can get the flu shot.    Objective: Vitals:   02/22/24 1323 02/22/24 2006 02/23/24 0455 02/23/24 0500  BP: 110/72 (!) 91/57 108/70   Pulse: 82 83 72   Resp: 16 16    Temp: 97.6 F (36.4 C) 98 F (36.7 C) 98.3 F (36.8 C)   TempSrc: Oral Oral Oral   SpO2: 93% 91% 94%   Weight:    (!) 198.9 kg  Height:        Intake/Output Summary (Last 24 hours) at 02/23/2024 1130 Last data filed at 02/23/2024 0830 Gross per 24 hour  Intake 480 ml  Output 1500 ml  Net -1020 ml   Filed Weights   02/21/24 0430 02/22/24 0500 02/23/24 0500  Weight: (!) 187.4 kg (!) 199.3 kg (!) 198.9 kg    Examination:  General exam: NAD. Respiratory system: CTAB anterior lung fields.  No wheezes, no crackles, no rhonchi.  Fair air movement.  Speaking in full sentences  Cardiovascular system: Regular rate and rhythm no murmurs rubs or gallops.  No JVD.  No pitting lower extremity edema. Gastrointestinal system: Abdomen is mildly distended, obese, soft, nontender to palpation.  Positive bowel sounds.  No rebound.  No guarding.  Central nervous system: Alert and oriented.  Moving extremities spontaneously.  No focal neurological deficits. Extremities: Left AKA.  Skin: No rashes, lesions or ulcers Psychiatry: Judgement and insight appear normal. Mood & affect appropriate.     Data Reviewed: I have personally reviewed following labs and imaging studies  CBC: Recent Labs   Lab 02/22/24 0601  WBC 2.5*  NEUTROABS 1.4*  HGB 9.3*  HCT 31.5*  MCV 104.3*  PLT 209    Basic Metabolic Panel: Recent Labs  Lab 02/22/24 0601  NA 141  K 3.6  CL 97*  CO2 37*  GLUCOSE 99  BUN 15  CREATININE 0.92  CALCIUM  10.2  MG 2.2    GFR: Estimated Creatinine Clearance: 122 mL/min (by C-G formula based on SCr of 0.92 mg/dL).  Liver Function Tests: No results for input(s): AST, ALT, ALKPHOS, BILITOT, PROT, ALBUMIN  in the last 168 hours.  CBG: No results for input(s): GLUCAP in the last 168 hours.   No results found for this or any previous visit (from the past 240 hours).       Radiology Studies: No results found.      Scheduled Meds:  apixaban   5 mg Oral BID   arformoterol   15 mcg Nebulization BID   hydrocerin   Topical BID   levothyroxine   125 mcg Oral Q0600   metoprolol  succinate  75 mg Oral Daily  revefenacin   175 mcg Nebulization Daily   torsemide   40 mg Oral Daily   Continuous Infusions:   LOS: 23 days    Time spent: 35 minutes    Toribio Hummer, MD [facility]   To contact the attending provider between 7A-7P or the covering provider during after hours 7P-7A, please log into the web site [email or web address] and access using universal [place] password for that web site. If you do not have the password, please call the hospital operator.  02/23/2024, 11:30 AM    "

## 2024-02-23 NOTE — Progress Notes (Signed)
 Physical Therapy Treatment Patient Details Name: [name] MRN: [id number] DOB: 05-Jun-1950 Today's Date: 02/23/2024   History of Present Illness Patient is 74y.o. male admitted from home after a fall from his wheelchair and was down for ~8 hours. Reported some leg swelling and SOB. CT chest, abdomen and pelvis showed intramuscular hematoma in the anterior compartment of R thigh. Other significant PMH includes morbid obesity, L AKA, A-fib, hypothyroidism, GERD, headaches, CHF.    PT Comments  Pt was assisted off bed pan by Nursing.  At this point, Pt was not up for getting OOB but did agree to sit EOB. General bed mobility comments: Assisted with transfering from supine to seated EOB.  Once upright, Pt was self able to static sit at Supervision level x 10.  Assisted with washing his back.  Tolerated well.  Feels good to sit up, stated Pt.  Required + 2 Max/Total Assist back to bed.  Scoot to [facility] and position to comfort. Prior to admit, Pt was home alone and able to self transfer in/OOB to his power WC via sliding board.  LPT has rec Pt will need ST Rehab at SNF to address mobility and functional decline prior to safely returning home.    If plan is discharge home, recommend the following: Assist for transportation;Two people to help with walking and/or transfers;Two people to help with bathing/dressing/bathroom;Help with stairs or ramp for entrance;Assistance with cooking/housework   Can travel by private vehicle     No  Equipment Recommendations  None recommended by PT    Recommendations for Other Services       Precautions / Restrictions Precautions Precautions: Fall Precaution/Restrictions Comments: watch O2/HR, old L AKA Restrictions Weight Bearing Restrictions Per Provider Order: No     Mobility  Bed Mobility Overal bed mobility: Needs Assistance Bed Mobility: Supine to Sit, Sit to Supine Rolling: Used rails, Max assist   Supine to sit: Max assist, Total assist, +2  for physical assistance, +2 for safety/equipment Sit to supine: Max assist, Total assist, +2 for physical assistance   General bed mobility comments: Assisted with transfering from supine to seated EOB.  Once upright, Pt was self able to static sit at Supervision level x 10.  Assisted with washing his back.  Tolerated well.  Feels good to sit up, stated Pt.  Required + 2 Max/Total Assist back to bed.  Scoot to [facility] and position to comfort.    Transfers                        Ambulation/Gait                   Stairs             Wheelchair Mobility     Tilt Bed    Modified Rankin (Stroke Patients Only)       Balance                                            Communication    Cognition Arousal: Alert Behavior During Therapy: WFL for tasks assessed/performed   PT - Cognitive impairments: No apparent impairments                       PT - Cognition Comments: AxO x 3 pleasant and willing.  Cueing    Exercises      General Comments        Pertinent Vitals/Pain Pain Assessment Pain Assessment: No/denies pain    Home Living                          Prior Function            PT Goals (current goals can now be found in the care plan section) Progress towards PT goals: Progressing toward goals    Frequency    Min 3X/week      PT Plan      Co-evaluation              AM-PAC PT 6 Clicks Mobility   Outcome Measure  Help needed turning from your back to your side while in a flat bed without using bedrails?: A Lot Help needed moving from lying on your back to sitting on the side of a flat bed without using bedrails?: A Lot Help needed moving to and from a bed to a chair (including a wheelchair)?: Total Help needed standing up from a chair using your arms (e.g., wheelchair or bedside chair)?: Total Help needed to walk in hospital room?: Total Help needed climbing 3-5 steps with a  railing? : Total 6 Click Score: 8    End of Session Equipment Utilized During Treatment: Gait belt;Oxygen Activity Tolerance: Patient tolerated treatment well;Patient limited by pain Patient left: in bed;with call bell/phone within reach;with bed alarm set Nurse Communication: Mobility status PT Visit Diagnosis: Muscle weakness (generalized) (M62.81);History of falling (Z91.81);Other abnormalities of gait and mobility (R26.89)     Time: 8570-8545 PT Time Calculation (min) (ACUTE ONLY): 25 min  Charges:    $Therapeutic Activity: 23-37 mins PT General Charges $$ ACUTE PT VISIT: 1 Visit                     Katheryn Leap  PTA Acute  Rehabilitation Services Office M-F          [phone]

## 2024-02-23 NOTE — Progress Notes (Signed)
 Physical Therapy Treatment Patient Details Name: [name] MRN: [id number] DOB: December 08, 1950 Today's Date: 02/23/2024   History of Present Illness Patient is 74y.o. male admitted from home after a fall from his wheelchair and was down for ~8 hours. Reported some leg swelling and SOB. CT chest, abdomen and pelvis showed intramuscular hematoma in the anterior compartment of R thigh. Other significant PMH includes morbid obesity, L AKA, A-fib, hypothyroidism, GERD, headaches, CHF.    PT Comments  Mr Edgett is pleasant and willing.  General bed mobility comments: Assisted with rolling side to side several times required + 2 Max/Total Assist.  First, performed side to side rolling to place Maxi move pad under Pt as he agreed to get OOB to recliner.  Then, Pt had urge to have a BM.  so assisted with rolling side to side again to place bed pan under Pt then position to comfort.  It's going to be a while, stated Pt.  Indicating he needed several minutes. Reported to nursing, Pt left on bed pan.  Therapy to return later.     If plan is discharge home, recommend the following: Assist for transportation;Two people to help with walking and/or transfers;Two people to help with bathing/dressing/bathroom;Help with stairs or ramp for entrance;Assistance with cooking/housework   Can travel by private vehicle     No  Equipment Recommendations  None recommended by PT    Recommendations for Other Services       Precautions / Restrictions Precautions Precautions: Fall Precaution/Restrictions Comments: watch O2/HR, old L AKA Restrictions Weight Bearing Restrictions Per Provider Order: No     Mobility  Bed Mobility Overal bed mobility: Needs Assistance Bed Mobility: Rolling Rolling: Used rails, Max assist         General bed mobility comments: Assisted with rolling side to side several times required + 2 Max/Total Assist.  First, performed side to side rolling to place Maxi move pad under Pt as  he agreed to get OOB to recliner.  Then, Pt had urge to have a BM.  so assisted with rolling side to side again to place bed pan under Pt then position to comfort.  It's going to be a while, stated Pt.  Indicating he needed several minutes.    Transfers                        Ambulation/Gait                   Stairs             Wheelchair Mobility     Tilt Bed    Modified Rankin (Stroke Patients Only)       Balance                                            Communication    Cognition Arousal: Alert Behavior During Therapy: WFL for tasks assessed/performed   PT - Cognitive impairments: No apparent impairments                       PT - Cognition Comments: AxO x 3 pleasant and willing.        Cueing    Exercises      General Comments        Pertinent Vitals/Pain Pain Assessment Pain Assessment: No/denies pain  Home Living                          Prior Function            PT Goals (current goals can now be found in the care plan section) Progress towards PT goals: Progressing toward goals    Frequency    Min 3X/week      PT Plan      Co-evaluation              AM-PAC PT 6 Clicks Mobility   Outcome Measure  Help needed turning from your back to your side while in a flat bed without using bedrails?: A Lot Help needed moving from lying on your back to sitting on the side of a flat bed without using bedrails?: A Lot Help needed moving to and from a bed to a chair (including a wheelchair)?: Total Help needed standing up from a chair using your arms (e.g., wheelchair or bedside chair)?: Total Help needed to walk in hospital room?: Total Help needed climbing 3-5 steps with a railing? : Total 6 Click Score: 8    End of Session Equipment Utilized During Treatment: Gait belt;Oxygen Activity Tolerance: Patient tolerated treatment well;Patient limited by pain Patient left:  in bed;with call bell/phone within reach;with bed alarm set Nurse Communication: Mobility status PT Visit Diagnosis: Muscle weakness (generalized) (M62.81);History of falling (Z91.81);Other abnormalities of gait and mobility (R26.89)     Time: 8663-8645 PT Time Calculation (min) (ACUTE ONLY): 18 min  Charges:    $Therapeutic Activity: 8-22 mins PT General Charges $$ ACUTE PT VISIT: 1 Visit                     Katheryn Leap  PTA Acute  Rehabilitation Services Office M-F          [phone]

## 2024-02-23 NOTE — TOC Progression Note (Addendum)
 Transition of Care [facility]) - Progression Note    Patient Details  Name: [name] MRN: [id number] Date of Birth: 1950/05/27  [facility]) CM/SW Contact  Teniya Filter, Nathanel, RN Phone Number: 02/23/2024, 3:27 PM  Clinical Narrative:  Patient has shara for new facility-[facility] [address] [place] [address]-mze Tina [phone] that [facility] will transport in am p/u @ 10a to transport to [facility] [employer] rep Glendia from [employer] the insurance then what the insurance doesn't cover patient is responsible for-patient agree to the process & billing responsibility. -4p If any controlled meds will be faxed to [facility] fax#[phone].    Expected Discharge Plan: Skilled Nursing Facility Barriers to Discharge: Continued Medical Work up               Expected Discharge Plan and Services   Discharge Planning Services: CM Consult Post Acute Care Choice: IP Rehab Living arrangements for the past 2 months: Single Family Home                 DME Arranged: Other see comment [facility] rep Ashley-NIV-approved & ready to deliver to facility @ d/c.) DME Agency: Other - Comment ([facility]rep Rosina) Date DME Agency Contacted: 02/17/24 Time DME Agency Contacted: [phone] Representative spoke with at DME Agency: Rosina             Social Drivers of Health (SDOH) Interventions SDOH Screenings   Food Insecurity: No Food Insecurity (02/21/2024)  Housing: Unknown (02/21/2024)  Transportation Needs: No Transportation Needs (02/21/2024)  Utilities: Not At Risk (02/21/2024)  Financial Resource Strain: Low Risk (04/13/2023)   Received from [facility]  Physical Activity: Unknown (04/13/2023)   Received from [facility]  Social Connections: Moderately Integrated (02/21/2024)  Stress: Stress Concern Present (04/13/2023)   Received from [facility]  Tobacco Use: Low Risk (01/31/2024)    Readmission Risk Interventions     No data  to display

## 2024-02-24 DIAGNOSIS — G4733 Obstructive sleep apnea (adult) (pediatric): Secondary | ICD-10-CM

## 2024-02-24 DIAGNOSIS — I872 Venous insufficiency (chronic) (peripheral): Secondary | ICD-10-CM

## 2024-02-24 DIAGNOSIS — E039 Hypothyroidism, unspecified: Secondary | ICD-10-CM | POA: Diagnosis not present

## 2024-02-24 DIAGNOSIS — T148XXA Other injury of unspecified body region, initial encounter: Secondary | ICD-10-CM | POA: Diagnosis not present

## 2024-02-24 DIAGNOSIS — J9601 Acute respiratory failure with hypoxia: Secondary | ICD-10-CM | POA: Diagnosis not present

## 2024-02-24 DIAGNOSIS — I5023 Acute on chronic systolic (congestive) heart failure: Secondary | ICD-10-CM | POA: Diagnosis not present

## 2024-02-24 DIAGNOSIS — W19XXXS Unspecified fall, sequela: Secondary | ICD-10-CM

## 2024-02-24 DIAGNOSIS — L98499 Non-pressure chronic ulcer of skin of other sites with unspecified severity: Secondary | ICD-10-CM

## 2024-02-24 NOTE — Progress Notes (Signed)
" °   02/23/24 2330  BiPAP/CPAP/SIPAP  BiPAP/CPAP/SIPAP SERVO (air)  Mask Type Full face mask  Dentures removed? Not applicable  Mask Size Large  Set Rate 12 breaths/min  Respiratory Rate 25 breaths/min  IPAP 18 cmH20  EPAP 6 cmH2O  Pressure Support 12 cmH20  PEEP 6 cmH20  FiO2 (%) 40 %  Flow Rate 0 lpm  Minute Ventilation 12.8  Leak 89  Peak Inspiratory Pressure (PIP) 16  Tidal Volume (Vt) 459  Patient Home Machine No  Patient Home Mask No  Patient Home Tubing No  Auto Titrate No  Press High Alarm 30 cmH2O  Device Plugged into RED Power Outlet Yes  Oxygen Percent 40 %  BiPAP/CPAP /SiPAP Vitals  Resp (!) 23  MEWS Score/Color  MEWS Score 1  MEWS Score Color Green    "

## 2024-02-24 NOTE — Care Management Important Message (Signed)
 Important Message  Patient Details IM Letter given. Name: [name] MRN: [id number] Date of Birth: Jan 19, 1951   Important Message Given:  Yes - Medicare IM     [name] 02/24/2024, 9:39 AM

## 2024-02-24 NOTE — TOC Transition Note (Signed)
 Transition of Care [facility]) - Discharge Note   Patient Details  Name: [name] MRN: [id number] Date of Birth: 27-Nov-1950  Transition of Care [facility]) CM/SW Contact:  Bascom Service, RN Phone Number: 02/24/2024, 10:13 AM   Clinical Narrative: d/c to [facility] rep Ellouise accepted. [facility] for transport-patient aware/accept of billing responsibility. Emailed w/confirmation to [name] request form for BIPAP. Going to rm#[id number], report tel#[phone]. No further CM needs.      Final next level of care: Skilled Nursing Facility Barriers to Discharge: No Barriers Identified   Patient Goals and CMS Choice Patient states their goals for this hospitalization and ongoing recovery are:: Rehab CMS Medicare.gov Compare Post Acute Care list provided to:: Patient Choice offered to / list presented to : Patient [name] ownership interest in [facility].provided to:: Patient    Discharge Placement                       Discharge Plan and Services Additional resources added to the After Visit Summary for     Discharge Planning Services: CM Consult Post Acute Care Choice: IP Rehab          DME Arranged: Other see comment [facility] rep Ashley-NIV-approved & ready to deliver to facility @ d/c.) DME Agency: Other - Comment ([facility]rep [facility]) Date DME Agency Contacted: 02/17/24 Time DME Agency Contacted: [phone] Representative spoke with at DME Agency: Rosina            Social Drivers of Health (SDOH) Interventions SDOH Screenings   Food Insecurity: No Food Insecurity (02/21/2024)  Housing: Unknown (02/21/2024)  Transportation Needs: No Transportation Needs (02/21/2024)  Utilities: Not At Risk (02/21/2024)  Financial Resource Strain: Low Risk (04/13/2023)   Received from [facility]  Physical Activity: Unknown (04/13/2023)   Received from [facility]  Social Connections: Moderately Integrated (02/21/2024)  Stress: Stress Concern Present (04/13/2023)    Received from [facility]  Tobacco Use: Low Risk (01/31/2024)     Readmission Risk Interventions     No data to display

## 2024-02-24 NOTE — Discharge Summary (Signed)
 Physician Discharge Summary  [name] FMW:[id number] DOB: 07-Dec-1950 DOA: 01/31/2024  PCP: Pura Lenis, MD  Admit date: 01/31/2024 Discharge date: 02/24/2024  Time spent: 60 minutes  Recommendations for Outpatient Follow-up:  Follow-up with Dr. Kara pulmonary in 2 to 3 weeks. Follow-up with Dr. Francyne, cardiology in 2 to 3 weeks. Follow-up with MD at SNF.  Patient will need a basic metabolic profile, magnesium  level done in 1 week to follow-up on electrolytes and renal function.  Patient will need a CBC to follow-up on counts.   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  Diet recommendation: Heart healthy  Filed Weights   02/22/24 0500 02/23/24 0500 02/24/24 0447  Weight: (!) 199.3 kg (!) 198.9 kg (!) 198.5 kg    History of present illness:  HPI per Dr. Lou Dallas Raza [name] is a 74 y.o. male with medical history significant for morbid obesity, A-fib on Eliquis , OSA, status post left AKA, hypothyroidism, chronic systolic heart failure and DVT who presented to the ED for evaluation after a fall. Patient reports he lives alone but has a friend that checks on him often. He was attempting to move from his wheelchair to the bed when he fell on the ground. He was unable to get up on his own and laid down for about 8 hours before his friend found him and called EMS. He endorses some recent increase in leg swelling, mild shortness of breath and a chronic back pain but denies any  cough, chest pain, fever, chills, abdominal pain, nausea, vomiting or dysuria.   Per EMS, patient was found to have SpO2 of 90% on room air, placed on 3 L [place]   ED Course: Initial vitals show afebrile, RR 18-20, HR 80-100, SBP 110-130s, SpO2, initially 95% on 3 L [place], transitioned to BiPAP. Initial labs significant for proBNP 1218, pCO2 60, flat troponin, Hgb 9.2 otherwise normal renal function, LFTs, lactic acid and white count. EKG shows A-fib. CXR shows cardiomegaly with vascular congestion.  CT C/A/P shows intramuscular hematoma in the anterior compartment of the proximal left thigh. Pt received IV LR 1 L bolus, IV Lasix  40 mg x 1, and IV Rocephin . TRH was consulted for admission.   Hospital Course:  1 acute respiratory failure with hypoxia and hypercapnia/OSA - Patient seen in consultation by PCCM. - Patient noted to have OSA, OHS and CHF likely contributing to his acute on chronic hypercapnic and hypoxemic respiratory failure. - Patient diuresed adequately, seen by PCCM.   - Patient placed on Brovana  and Yupelri  nebs per pulmonary. - IV diuretics were subsequently transitioned to Demadex .   -Patient maintained on BiPAP nightly. - Patient improved clinically and was at baseline by day of discharge.  - Outpatient follow-up with pulmonary.   Mr. 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 Alarms and internal battery are required to ensure continuous support and immediate recognition of ventilatory insufficiency. A RAD with backup rate is  insufficient due to the severity of illness. Volume ventilation is needed to reduce work of breathing and fatigue and to prevent life threatening exacerbations. OSA is not the predominant cause of hypercapnia. PCO2 = 62 on ABG.    2.  Acute on chronic systolic heart failure -Patient noted to have received IV diuretics with good diuresis and clinical improvement - Patient  subsequently transition from IV diuretics to torsemide  and metoprolol  succinate.   - Patient was euvolemic by day of discharge.   - Outpatient follow-up with cardiology.    3.  Paroxysmal A-fib -Rate controlled on metoprolol  succinate. - Patient maintained on Eliquis  for anticoagulation. -Outpatient follow-up with cardiology.   4.  Fall -Patient noted to have a fall out of his wheelchair. - Patient seen by PT/OT who recommended SNF placement - Patient to be discharged to SNF.   5.  Obesity class III -Lifestyle modification. - Outpatient follow-up. - May benefit from Zepbound which would also help with his OSA. - Outpatient follow-up.   6.  Intramuscular hematoma anterior compartment of proximal left thigh -Secondary to fall. - Anticoagulation resumed during the hospitalization.   - Outpatient follow-up.    7.  Ulcer of extremity due to chronic venous insufficiency of the right leg -Patient seen by wound care RN. - Recommendations made for local wound care which were done during the hospitalization.   8.  Hypophosphatemia -Repleted.   9.  Thrombocytopenia -Resolved.   10.  Anemia of chronic disease - Hemoglobin stable at 9.3.   - Outpatient follow-up.   11.  Leukopenia -Follow.  Procedures: CT head 01/31/2024 CT C-spine 01/31/2024 CT chest abdomen and pelvis 01/31/2024 Chest x-ray 01/31/2024 2D echo 02/01/2024  Consultations: PCCM: Dr.Dewald 02/05/2024 Wound care RN  Discharge Exam: Vitals:   02/24/24 0755 02/24/24 0756  BP:    Pulse:    Resp:    Temp:    SpO2: 96% 97%    General: NAD Cardiovascular: Irregularly irregular.  No JVD.  No pitting lower extremity edema. Respiratory: CTAB anterior lung fields.  No wheezes, no crackles, no rhonchi.  Fair air movement.  Speaking in full sentences.  Discharge Instructions   Discharge Instructions     Diet - low sodium heart healthy   Complete by: As directed    Discharge wound care:   Complete by: As  directed    Cleanse R lower leg/foot with soap and water , dry and apply Eucerin to intact skin.  Cleanse R medial lower leg ulcer with NS, apply silver  hydrofiber Soila [phone] [employer]) cut to fit and secure with ABD pad and Kerlix roll gauze. Soak silver  with normal saline if adhered to wound bed for atraumatic removal   Increase activity slowly   Complete by: As directed    Pulmonary Visit   Complete by: As directed    Reason for referral: Other Pulmonary      Allergies as of 02/24/2024       Reactions   Morphine Hives        Medication List     STOP taking these medications    furosemide  20 MG tablet Commonly known as: LASIX    HYDROcodone -acetaminophen  5-325 MG tablet Commonly known as: NORCO/VICODIN   mupirocin  cream 2 % Commonly known as: BACTROBAN        TAKE these medications    acetaminophen  500 MG tablet Commonly known as: TYLENOL  Take 500 mg by mouth every 6 (six) hours as needed for mild pain (pain score 1-3) or fever (or headaches).  arformoterol  15 MCG/2ML Nebu Commonly known as: BROVANA  Take 2 mLs (15 mcg total) by nebulization 2 (two) times daily.   Desitin 13 % Crea Generic drug: Zinc  Oxide Apply 1 Application topically 2 (two) times daily as needed.   Eliquis  5 MG Tabs tablet Generic drug: apixaban  TAKE 1 TABLET BY MOUTH TWICE  DAILY   hydrocerin Crea Apply 1 Application topically 2 (two) times daily. Apply to intact skin of R lower leg/foot 2 times daily.  Do not place in between toes.   levothyroxine  150 MCG tablet Commonly known as: SYNTHROID  Take 150 mcg by mouth daily before breakfast.   metoprolol  succinate 50 MG 24 hr tablet Commonly known as: TOPROL -XL Take 1.5 tablets (75 mg total) by mouth daily. What changed: how much to take   potassium chloride  10 MEQ tablet Commonly known as: KLOR-CON  M Take 1 tablet (10 mEq total) by mouth daily.   revefenacin  175 MCG/3ML nebulizer solution Commonly known as: YUPELRI  Take 3  mLs (175 mcg total) by nebulization daily.   Torsemide  40 MG Tabs Take 40 mg by mouth daily.               Durable Medical Equipment  (From admission, onward)           Start     Ordered   02/07/24 0914  For home use only DME Bipap  Once       Comments: Bipap 14/6 with 3L of O2  Question Answer Comment  Length of Need Lifetime   Bleed in oxygen (LPM) 3   Inspiratory pressure OTHER SEE COMMENTS   Expiratory pressure OTHER SEE COMMENTS      02/07/24 0916              Discharge Care Instructions  (From admission, onward)           Start     Ordered   02/24/24 0000  Discharge wound care:       Comments: Cleanse R lower leg/foot with soap and water , dry and apply Eucerin to intact skin.  Cleanse R medial lower leg ulcer with NS, apply silver  hydrofiber Soila [phone] [employer]) cut to fit and secure with ABD pad and Kerlix roll gauze. Soak silver  with normal saline if adhered to wound bed for atraumatic removal   02/24/24 0920           Allergies[1]  Contact information for follow-up providers     Kara Dorn NOVAK, MD. Schedule an appointment as soon as possible for a visit in 3 week(s).   Specialty: Pulmonary Disease Why: Follow-up in 2 to 3 weeks. Contact information: [address] [place] [address] [phone]         Croitoru, Jerel, MD. Schedule an appointment as soon as possible for a visit in 2 week(s).   Specialty: Cardiology Why: Follow-up in 2 to 3 weeks Contact information: [address] [place] [address] [phone]         MD at SNF Follow up.               Contact information for after-discharge care     Home Medical Care     [facility]) .   Service: Home Health Services Contact information: [address] [place] [place]  [address] [phone]                      The results of  significant  diagnostics from this hospitalization (including imaging, microbiology, ancillary and laboratory) are listed below for reference.    Significant Diagnostic Studies: ECHOCARDIOGRAM COMPLETE Result Date: 02/01/2024    ECHOCARDIOGRAM REPORT   Patient Name:   [name] Date of Exam: 02/01/2024 Medical Rec #:  [id number]          Height:       68.0 in Accession #:    [id number]         Weight:       477.5 lb Date of Birth:  09/14/1950           BSA:          2.958 m Patient Age:    73 years           BP:           116/77 mmHg Patient Gender: M                  HR:           104 bpm. Exam Location:  Inpatient Procedure: 2D Echo, Cardiac Doppler, Color Doppler and Intracardiac            Opacification Agent (Both Spectral and Color Flow Doppler were            utilized during procedure). Indications:    R06.9 DOE; R06.02 SOB  History:        Patient has prior history of Echocardiogram examinations, most                 recent 06/22/2022. CHF, Abnormal ECG, Arrythmias:Atrial                 Fibrillation, Signs/Symptoms:Chest Pain, Shortness of Breath and                 Dyspnea; Risk Factors:Hypertension.  Sonographer:    Ellouise Mose RDCS Referring Phys: [id number] CLARETTA HERO [facility]  Sonographer Comments: Technically difficult study due to poor echo windows, suboptimal parasternal window, suboptimal apical window, suboptimal subcostal window, patient is obese and Technically challenging study due to limited acoustic windows. Image acquisition challenging due to patient body habitus. Nearly non-diagnostic study due to habitus. IMPRESSIONS  1. LV not seen well even with Definity . Very hard to interpret LVEF. Left ventricular ejection fraction, by estimation, is 50 to 55%. The left ventricle has low normal function. Left ventricular endocardial border not optimally defined to evaluate regional wall motion. There is moderate concentric left ventricular hypertrophy. Left ventricular diastolic parameters are indeterminate.   2. Right ventricular systolic function is mildly reduced. The right ventricular size is moderately enlarged. There is moderately elevated pulmonary artery systolic pressure. The estimated right ventricular systolic pressure is 48.4 mmHg.  3. Left atrial size was moderately dilated.  4. Right atrial size was moderately dilated.  5. There is no evidence of cardiac tamponade.  6. The mitral valve is normal in structure. No evidence of mitral valve regurgitation. No evidence of mitral stenosis.  7. The aortic valve is normal in structure. Aortic valve regurgitation is not visualized. No aortic stenosis is present.  8. Aortic dilatation noted. There is mild dilatation of the ascending aorta, measuring 41 mm.  9. The inferior vena cava is normal in size with greater than 50% respiratory variability, suggesting right atrial pressure of 3 mmHg. Conclusion(s)/Recommendation(s): Techniclly very limited study with very limited images. LV never seen well. FINDINGS  Left Ventricle: LV not seen well even with Definity . Very hard to  interpret LVEF. Left ventricular ejection fraction, by estimation, is 50 to 55%. The left ventricle has low normal function. Left ventricular endocardial border not optimally defined to evaluate regional wall motion. Definity  contrast agent was given IV to delineate the left ventricular endocardial borders. The left ventricular internal cavity size was normal in size. There is moderate concentric left ventricular hypertrophy. Left ventricular diastolic parameters are indeterminate. Right Ventricle: The right ventricular size is moderately enlarged. No increase in right ventricular wall thickness. Right ventricular systolic function is mildly reduced. There is moderately elevated pulmonary artery systolic pressure. The tricuspid regurgitant velocity is 2.89 m/s, and with an assumed right atrial pressure of 15 mmHg, the estimated right ventricular systolic pressure is 48.4 mmHg. Left Atrium: Left atrial  size was moderately dilated. Right Atrium: Right atrial size was moderately dilated. Pericardium: Trivial pericardial effusion is present. The pericardial effusion is surrounding the apex. There is no evidence of cardiac tamponade. Mitral Valve: The mitral valve is normal in structure. Mild mitral annular calcification. No evidence of mitral valve regurgitation. No evidence of mitral valve stenosis. Tricuspid Valve: The tricuspid valve is normal in structure. Tricuspid valve regurgitation is mild . No evidence of tricuspid stenosis. Aortic Valve: The aortic valve is normal in structure. Aortic valve regurgitation is not visualized. No aortic stenosis is present. Pulmonic Valve: The pulmonic valve was normal in structure. Pulmonic valve regurgitation is trivial. No evidence of pulmonic stenosis. Aorta: Aortic dilatation noted. There is mild dilatation of the ascending aorta, measuring 41 mm. Venous: The inferior vena cava is normal in size with greater than 50% respiratory variability, suggesting right atrial pressure of 3 mmHg. IAS/Shunts: No atrial level shunt detected by color flow Doppler.  LEFT VENTRICLE PLAX 2D LVIDd:         5.10 cm LVIDs:         3.90 cm LV PW:         1.40 cm LV IVS:        1.40 cm LVOT diam:     2.40 cm LV SV:         74 LV SV Index:   25 LVOT Area:     4.52 cm  LV Volumes (MOD) LV vol d, MOD A4C: 91.6 ml LV vol s, MOD A4C: 46.9 ml LV SV MOD A4C:     91.6 ml RIGHT VENTRICLE             IVC RV S prime:     10.00 cm/s  IVC diam: 2.00 cm LEFT ATRIUM              Index        RIGHT ATRIUM           Index LA diam:        5.10 cm  1.72 cm/m   RA Area:     36.80 cm LA Vol (A2C):   124.0 ml 41.91 ml/m  RA Volume:   141.00 ml 47.66 ml/m LA Vol (A4C):   198.0 ml 66.93 ml/m LA Biplane Vol: 169.0 ml 57.13 ml/m  AORTIC VALVE LVOT Vmax:   94.60 cm/s LVOT Vmean:  59.700 cm/s LVOT VTI:    0.163 m  AORTA Ao Root diam: 3.10 cm Ao Asc diam:  4.10 cm MITRAL VALVE               TRICUSPID VALVE MV Area  (PHT): 9.85 cm    TR Peak grad:   33.4 mmHg MV Decel Time: 77 msec  TR Vmax:        289.00 cm/s MV E velocity: 60.70 cm/s                            SHUNTS                            Systemic VTI:  0.16 m                            Systemic Diam: 2.40 cm Toribio Fuel MD Electronically signed by Toribio Fuel MD Signature Date/Time: 02/01/2024/9:25:23 PM    Final    CT CHEST ABDOMEN PELVIS W CONTRAST Result Date: 01/31/2024 CLINICAL DATA:  Fall and back pain. EXAM: CT CHEST, ABDOMEN, AND PELVIS WITH CONTRAST TECHNIQUE: Multidetector CT imaging of the chest, abdomen and pelvis was performed following the standard protocol during bolus administration of intravenous contrast. RADIATION DOSE REDUCTION: This exam was performed according to the departmental dose-optimization program which includes automated exposure control, adjustment of the mA and/or kV according to patient size and/or use of iterative reconstruction technique. CONTRAST:  OMNIPAQUE  IOHEXOL  300 MG/ML  SOLN COMPARISON:  CT dated 09/07/2021. FINDINGS: Evaluation is limited due to streak artifact caused by body habitus and patient's arms. CT CHEST FINDINGS Cardiovascular: Mild cardiomegaly. No pericardial effusion. There is coronary vascular calcification of the LAD. Mild atherosclerotic calcification of the thoracic aorta. No aneurysm dilatation or dissection. The central pulmonary arteries appear patent as visualized. Mediastinum/Nodes: No hilar or mediastinal adenopathy. The esophagus and the thyroid  gland are grossly unremarkable. No mediastinal fluid collection. Lungs/Pleura: Bilateral lower lobe predominant streaky atelectasis. Pneumonia is less likely but not excluded. No consolidative changes. There is no pleural effusion pneumothorax. The central airways are patent. Musculoskeletal: Degenerative changes of the spine. No acute osseous pathology. CT ABDOMEN PELVIS FINDINGS No intra-abdominal free air or free fluid. Hepatobiliary:  The liver is unremarkable. No biliary dilatation. Multiple gallstones. No pericholecystic fluid or evidence of acute cholecystitis by CT. Pancreas: Unremarkable. No pancreatic ductal dilatation or surrounding inflammatory changes. Spleen: Normal in size without focal abnormality. Adrenals/Urinary Tract: The adrenal glands unremarkable. There is no hydronephrosis on either side. The visualized ureters and urinary bladder appear unremarkable. Stomach/Bowel: Moderate stool throughout the colon. There is no bowel obstruction or active inflammation. The appendix is normal. Vascular/Lymphatic: Mild aortoiliac atherosclerotic disease. The IVC is unremarkable. No portal venous gas. No adenopathy. Reproductive: The prostate and seminal vesicles are grossly unremarkable Other: Intramuscular hematoma in the anterior compartment of the proximal left thigh involving the sartorius muscle measures approximately 3.5 x 5.3 cm in greatest axial dimensions and 17 cm in craniocaudal length. Musculoskeletal: Osteopenia with degenerative changes of the spine. No acute osseous pathology. IMPRESSION: 1. Intramuscular hematoma in the anterior compartment of the proximal left thigh. 2. No acute/traumatic intrathoracic, abdominal, or pelvic pathology. 3. Cholelithiasis. 4.  Aortic Atherosclerosis (ICD10-I70.0). Electronically Signed   By: Vanetta Chou M.D.   On: 01/31/2024 15:49   CT T-SPINE NO CHARGE Result Date: 01/31/2024 CLINICAL DATA:  Fall and back pain. EXAM: CT THORACIC AND LUMBAR SPINE WITHOUT CONTRAST TECHNIQUE: Multidetector CT imaging of the thoracic and lumbar spine was performed without contrast. Multiplanar CT image reconstructions were also generated. RADIATION DOSE REDUCTION: This exam was performed according to the departmental dose-optimization program which includes automated exposure control, adjustment of the mA and/or kV according to patient  size and/or use of iterative reconstruction technique. COMPARISON:  CT  chest abdomen pelvis dated 09/07/2021. FINDINGS: Evaluation is limited due to advanced osteopenia, degenerative changes, and soft tissue attenuation and motion. CT THORACIC SPINE FINDINGS Alignment: No acute subluxation. Vertebrae: No obvious fracture.  Osteopenia. Paraspinal and other soft tissues: Negative. Disc levels: Multilevel degenerative changes with bridging osteophyte. CT LUMBAR SPINE FINDINGS Segmentation: 5 lumbar type vertebra. Alignment: Normal. Vertebrae: No obvious acute fracture.  Osteopenia. Paraspinal and other soft tissues: Negative. Disc levels: No acute findings.  Multilevel degenerative changes. IMPRESSION: 1. No obvious acute/traumatic thoracic or lumbar spine pathology. 2. Osteopenia and degenerative changes. Electronically Signed   By: Vanetta Chou M.D.   On: 01/31/2024 15:39   CT L-SPINE NO CHARGE Result Date: 01/31/2024 CLINICAL DATA:  Fall and back pain. EXAM: CT THORACIC AND LUMBAR SPINE WITHOUT CONTRAST TECHNIQUE: Multidetector CT imaging of the thoracic and lumbar spine was performed without contrast. Multiplanar CT image reconstructions were also generated. RADIATION DOSE REDUCTION: This exam was performed according to the departmental dose-optimization program which includes automated exposure control, adjustment of the mA and/or kV according to patient size and/or use of iterative reconstruction technique. COMPARISON:  CT chest abdomen pelvis dated 09/07/2021. FINDINGS: Evaluation is limited due to advanced osteopenia, degenerative changes, and soft tissue attenuation and motion. CT THORACIC SPINE FINDINGS Alignment: No acute subluxation. Vertebrae: No obvious fracture.  Osteopenia. Paraspinal and other soft tissues: Negative. Disc levels: Multilevel degenerative changes with bridging osteophyte. CT LUMBAR SPINE FINDINGS Segmentation: 5 lumbar type vertebra. Alignment: Normal. Vertebrae: No obvious acute fracture.  Osteopenia. Paraspinal and other soft tissues: Negative.  Disc levels: No acute findings.  Multilevel degenerative changes. IMPRESSION: 1. No obvious acute/traumatic thoracic or lumbar spine pathology. 2. Osteopenia and degenerative changes. Electronically Signed   By: Vanetta Chou M.D.   On: 01/31/2024 15:39   CT Head Wo Contrast Result Date: 01/31/2024 EXAM: CT HEAD AND CERVICAL SPINE 01/31/2024 01:30:00 PM TECHNIQUE: CT of the head and cervical spine was performed without the administration of intravenous contrast. Multiplanar reformatted images are provided for review. Automated exposure control, iterative reconstruction, and/or weight based adjustment of the mA/kV was utilized to reduce the radiation dose to as low as reasonably achievable. COMPARISON: None available. CLINICAL HISTORY: fall, hit head FINDINGS: CT HEAD BRAIN AND VENTRICLES: No acute intracranial hemorrhage. No mass effect or midline shift. No abnormal extra-axial fluid collection. No evidence of acute infarct. No hydrocephalus. ORBITS: No acute abnormality. SINUSES AND MASTOIDS: No acute abnormality. SOFT TISSUES AND SKULL: No acute skull fracture. No acute soft tissue abnormality. CT CERVICAL SPINE Limited study due to motion and beam hardening artifact. Within this limitation: BONES AND ALIGNMENT: No acute fracture or traumatic malalignment. DEGENERATIVE CHANGES: Severe degenerative change including degenerative disc disease and facet/uncovertebral hypertrophy with varying degrees of neural foraminal stenosis. SOFT TISSUES: No prevertebral soft tissue swelling. LUNGS: See concurrent CT of the chest for intrathoracic evaluation. IMPRESSION: 1. No acute intracranial abnormality. 2. Limited CT of the cervical spine without evidence of acute fracture or traumatic malalignment. 3. Severe cervical degenerative change. Electronically signed by: Gilmore Molt 01/31/2024 02:26 PM EST RP Workstation: [id number]   CT Cervical Spine Wo Contrast Result Date: 01/31/2024 EXAM: CT HEAD AND CERVICAL  SPINE 01/31/2024 01:30:00 PM TECHNIQUE: CT of the head and cervical spine was performed without the administration of intravenous contrast. Multiplanar reformatted images are provided for review. Automated exposure control, iterative reconstruction, and/or weight based adjustment of the mA/kV was utilized to reduce the radiation  dose to as low as reasonably achievable. COMPARISON: None available. CLINICAL HISTORY: fall, hit head FINDINGS: CT HEAD BRAIN AND VENTRICLES: No acute intracranial hemorrhage. No mass effect or midline shift. No abnormal extra-axial fluid collection. No evidence of acute infarct. No hydrocephalus. ORBITS: No acute abnormality. SINUSES AND MASTOIDS: No acute abnormality. SOFT TISSUES AND SKULL: No acute skull fracture. No acute soft tissue abnormality. CT CERVICAL SPINE Limited study due to motion and beam hardening artifact. Within this limitation: BONES AND ALIGNMENT: No acute fracture or traumatic malalignment. DEGENERATIVE CHANGES: Severe degenerative change including degenerative disc disease and facet/uncovertebral hypertrophy with varying degrees of neural foraminal stenosis. SOFT TISSUES: No prevertebral soft tissue swelling. LUNGS: See concurrent CT of the chest for intrathoracic evaluation. IMPRESSION: 1. No acute intracranial abnormality. 2. Limited CT of the cervical spine without evidence of acute fracture or traumatic malalignment. 3. Severe cervical degenerative change. Electronically signed by: Gilmore Molt 01/31/2024 02:26 PM EST RP Workstation: [id number]   DG Chest 1 View Result Date: 01/31/2024 CLINICAL DATA:  Shortness of breath. EXAM: CHEST  1 VIEW COMPARISON:  Chest radiograph dated 09/14/2021. FINDINGS: There is cardiomegaly with vascular congestion. No focal consolidation, pleural effusion, pneumothorax. No acute osseous pathology. IMPRESSION: Cardiomegaly with vascular congestion. Electronically Signed   By: Vanetta Chou M.D.   On: 01/31/2024 13:40     Microbiology: No results found for this or any previous visit (from the past 240 hours).   Labs: Basic Metabolic Panel: Recent Labs  Lab 02/22/24 0601  NA 141  K 3.6  CL 97*  CO2 37*  GLUCOSE 99  BUN 15  CREATININE 0.92  CALCIUM  10.2  MG 2.2   Liver Function Tests: No results for input(s): AST, ALT, ALKPHOS, BILITOT, PROT, ALBUMIN  in the last 168 hours. No results for input(s): LIPASE, AMYLASE in the last 168 hours. No results for input(s): AMMONIA in the last 168 hours. CBC: Recent Labs  Lab 02/22/24 0601  WBC 2.5*  NEUTROABS 1.4*  HGB 9.3*  HCT 31.5*  MCV 104.3*  PLT 209   Cardiac Enzymes: No results for input(s): CKTOTAL, CKMB, CKMBINDEX, TROPONINI in the last 168 hours. BNP: BNP (last 3 results) No results for input(s): BNP in the last 8760 hours.  ProBNP (last 3 results) Recent Labs    01/31/24 1436  PROBNP 1,218.0*    CBG: No results for input(s): GLUCAP in the last 168 hours.     Signed:  Toribio Hummer MD.  [facility] 02/24/2024, 9:28 AM       [1]  Allergies Allergen Reactions   Morphine Hives

## 2024-04-03 ENCOUNTER — Ambulatory Visit: Admitting: Cardiovascular Disease
